# Patient Record
Sex: Female | Born: 1945
Health system: Southern US, Community
[De-identification: ages and names within clinical notes are randomized; demographics above are authoritative.]

## PROBLEM LIST (undated history)

## (undated) DIAGNOSIS — G43909 Migraine, unspecified, not intractable, without status migrainosus: Secondary | ICD-10-CM

## (undated) DIAGNOSIS — L309 Dermatitis, unspecified: Secondary | ICD-10-CM

## (undated) DIAGNOSIS — K635 Polyp of colon: Secondary | ICD-10-CM

## (undated) DIAGNOSIS — Z8619 Personal history of other infectious and parasitic diseases: Secondary | ICD-10-CM

## (undated) DIAGNOSIS — M1990 Unspecified osteoarthritis, unspecified site: Secondary | ICD-10-CM

## (undated) DIAGNOSIS — E119 Type 2 diabetes mellitus without complications: Secondary | ICD-10-CM

## (undated) DIAGNOSIS — R197 Diarrhea, unspecified: Secondary | ICD-10-CM

## (undated) DIAGNOSIS — R51 Headache: Secondary | ICD-10-CM

## (undated) DIAGNOSIS — K219 Gastro-esophageal reflux disease without esophagitis: Secondary | ICD-10-CM

## (undated) DIAGNOSIS — R0602 Shortness of breath: Secondary | ICD-10-CM

## (undated) DIAGNOSIS — R739 Hyperglycemia, unspecified: Secondary | ICD-10-CM

## (undated) DIAGNOSIS — Z0000 Encounter for general adult medical examination without abnormal findings: Secondary | ICD-10-CM

## (undated) DIAGNOSIS — K5792 Diverticulitis of intestine, part unspecified, without perforation or abscess without bleeding: Secondary | ICD-10-CM

## (undated) DIAGNOSIS — K7460 Unspecified cirrhosis of liver: Secondary | ICD-10-CM

## (undated) DIAGNOSIS — N649 Disorder of breast, unspecified: Secondary | ICD-10-CM

## (undated) DIAGNOSIS — C649 Malignant neoplasm of unspecified kidney, except renal pelvis: Secondary | ICD-10-CM

## (undated) DIAGNOSIS — C3490 Malignant neoplasm of unspecified part of unspecified bronchus or lung: Secondary | ICD-10-CM

## (undated) DIAGNOSIS — J189 Pneumonia, unspecified organism: Secondary | ICD-10-CM

## (undated) DIAGNOSIS — E669 Obesity, unspecified: Secondary | ICD-10-CM

## (undated) DIAGNOSIS — N289 Disorder of kidney and ureter, unspecified: Secondary | ICD-10-CM

## (undated) DIAGNOSIS — T7840XA Allergy, unspecified, initial encounter: Secondary | ICD-10-CM

## (undated) DIAGNOSIS — M109 Gout, unspecified: Secondary | ICD-10-CM

## (undated) HISTORY — DX: Polyp of colon: K63.5

## (undated) HISTORY — PX: ABDOMINAL HYSTERECTOMY: SHX81

## (undated) HISTORY — DX: Gout, unspecified: M10.9

## (undated) HISTORY — DX: Obesity, unspecified: E66.9

## (undated) HISTORY — PX: ESOPHAGOGASTRODUODENOSCOPY: SHX1529

## (undated) HISTORY — DX: Diarrhea, unspecified: R19.7

## (undated) HISTORY — DX: Personal history of other infectious and parasitic diseases: Z86.19

## (undated) HISTORY — DX: Allergy, unspecified, initial encounter: T78.40XA

## (undated) HISTORY — DX: Dermatitis, unspecified: L30.9

## (undated) HISTORY — DX: Disorder of breast, unspecified: N64.9

## (undated) HISTORY — DX: Migraine, unspecified, not intractable, without status migrainosus: G43.909

## (undated) HISTORY — DX: Diverticulitis of intestine, part unspecified, without perforation or abscess without bleeding: K57.92

## (undated) HISTORY — DX: Type 2 diabetes mellitus without complications: E11.9

## (undated) HISTORY — PX: CHOLECYSTECTOMY: SHX55

## (undated) HISTORY — DX: Hyperglycemia, unspecified: R73.9

## (undated) HISTORY — DX: Encounter for general adult medical examination without abnormal findings: Z00.00

## (undated) HISTORY — PX: CARDIAC CATHETERIZATION: SHX172

## (undated) HISTORY — PX: APPENDECTOMY: SHX54

---

## 1997-05-05 ENCOUNTER — Ambulatory Visit (HOSPITAL_COMMUNITY): Admission: RE | Admit: 1997-05-05 | Discharge: 1997-05-05 | Payer: Self-pay | Admitting: *Deleted

## 1997-12-10 ENCOUNTER — Ambulatory Visit (HOSPITAL_COMMUNITY): Admission: RE | Admit: 1997-12-10 | Discharge: 1997-12-10 | Payer: Self-pay | Admitting: *Deleted

## 1999-01-31 HISTORY — PX: KIDNEY SURGERY: SHX687

## 1999-01-31 HISTORY — PX: RENAL MASS EXCISION: SHX2324

## 1999-03-02 ENCOUNTER — Encounter: Payer: Self-pay | Admitting: *Deleted

## 1999-03-02 ENCOUNTER — Ambulatory Visit (HOSPITAL_COMMUNITY): Admission: RE | Admit: 1999-03-02 | Discharge: 1999-03-02 | Payer: Self-pay | Admitting: *Deleted

## 1999-11-06 ENCOUNTER — Emergency Department (HOSPITAL_COMMUNITY): Admission: EM | Admit: 1999-11-06 | Discharge: 1999-11-06 | Payer: Self-pay | Admitting: Emergency Medicine

## 1999-11-06 ENCOUNTER — Encounter: Payer: Self-pay | Admitting: Emergency Medicine

## 1999-11-15 ENCOUNTER — Inpatient Hospital Stay (HOSPITAL_COMMUNITY): Admission: RE | Admit: 1999-11-15 | Discharge: 1999-11-19 | Payer: Self-pay

## 1999-11-15 ENCOUNTER — Encounter (INDEPENDENT_AMBULATORY_CARE_PROVIDER_SITE_OTHER): Payer: Self-pay | Admitting: Specialist

## 2000-04-11 ENCOUNTER — Encounter: Payer: Self-pay | Admitting: *Deleted

## 2000-04-11 ENCOUNTER — Ambulatory Visit (HOSPITAL_COMMUNITY): Admission: RE | Admit: 2000-04-11 | Discharge: 2000-04-11 | Payer: Self-pay | Admitting: *Deleted

## 2000-07-11 ENCOUNTER — Encounter: Payer: Self-pay | Admitting: Urology

## 2000-07-11 ENCOUNTER — Encounter: Admission: RE | Admit: 2000-07-11 | Discharge: 2000-07-11 | Payer: Self-pay | Admitting: Urology

## 2001-01-01 ENCOUNTER — Encounter: Payer: Self-pay | Admitting: Urology

## 2001-01-01 ENCOUNTER — Encounter: Admission: RE | Admit: 2001-01-01 | Discharge: 2001-01-01 | Payer: Self-pay | Admitting: Urology

## 2001-04-24 ENCOUNTER — Ambulatory Visit (HOSPITAL_COMMUNITY): Admission: RE | Admit: 2001-04-24 | Discharge: 2001-04-24 | Payer: Self-pay | Admitting: *Deleted

## 2001-04-24 ENCOUNTER — Encounter: Payer: Self-pay | Admitting: *Deleted

## 2001-07-02 ENCOUNTER — Encounter: Payer: Self-pay | Admitting: Urology

## 2001-07-02 ENCOUNTER — Encounter: Admission: RE | Admit: 2001-07-02 | Discharge: 2001-07-02 | Payer: Self-pay | Admitting: Urology

## 2001-08-16 ENCOUNTER — Encounter (INDEPENDENT_AMBULATORY_CARE_PROVIDER_SITE_OTHER): Payer: Self-pay | Admitting: *Deleted

## 2001-08-16 ENCOUNTER — Ambulatory Visit (HOSPITAL_COMMUNITY): Admission: RE | Admit: 2001-08-16 | Discharge: 2001-08-16 | Payer: Self-pay | Admitting: Gastroenterology

## 2002-01-07 ENCOUNTER — Encounter: Admission: RE | Admit: 2002-01-07 | Discharge: 2002-01-07 | Payer: Self-pay | Admitting: Urology

## 2002-01-07 ENCOUNTER — Encounter: Payer: Self-pay | Admitting: Urology

## 2002-04-29 ENCOUNTER — Encounter: Payer: Self-pay | Admitting: *Deleted

## 2002-04-29 ENCOUNTER — Ambulatory Visit (HOSPITAL_COMMUNITY): Admission: RE | Admit: 2002-04-29 | Discharge: 2002-04-29 | Payer: Self-pay | Admitting: *Deleted

## 2002-07-16 ENCOUNTER — Encounter: Admission: RE | Admit: 2002-07-16 | Discharge: 2002-07-16 | Payer: Self-pay | Admitting: Urology

## 2002-07-16 ENCOUNTER — Encounter: Payer: Self-pay | Admitting: Urology

## 2002-12-10 ENCOUNTER — Ambulatory Visit (HOSPITAL_COMMUNITY): Admission: RE | Admit: 2002-12-10 | Discharge: 2002-12-10 | Payer: Self-pay | Admitting: Urology

## 2003-06-03 ENCOUNTER — Ambulatory Visit (HOSPITAL_COMMUNITY): Admission: RE | Admit: 2003-06-03 | Discharge: 2003-06-03 | Payer: Self-pay | Admitting: *Deleted

## 2003-08-04 ENCOUNTER — Emergency Department (HOSPITAL_COMMUNITY): Admission: EM | Admit: 2003-08-04 | Discharge: 2003-08-04 | Payer: Self-pay | Admitting: Emergency Medicine

## 2004-01-12 ENCOUNTER — Ambulatory Visit: Payer: Self-pay | Admitting: Internal Medicine

## 2004-06-14 ENCOUNTER — Ambulatory Visit (HOSPITAL_COMMUNITY): Admission: RE | Admit: 2004-06-14 | Discharge: 2004-06-14 | Payer: Self-pay | Admitting: *Deleted

## 2004-09-28 ENCOUNTER — Ambulatory Visit (HOSPITAL_COMMUNITY): Admission: RE | Admit: 2004-09-28 | Discharge: 2004-09-28 | Payer: Self-pay | Admitting: Gastroenterology

## 2004-09-28 ENCOUNTER — Encounter (INDEPENDENT_AMBULATORY_CARE_PROVIDER_SITE_OTHER): Payer: Self-pay | Admitting: Specialist

## 2005-09-01 ENCOUNTER — Ambulatory Visit (HOSPITAL_COMMUNITY): Admission: RE | Admit: 2005-09-01 | Discharge: 2005-09-01 | Payer: Self-pay | Admitting: *Deleted

## 2005-09-19 ENCOUNTER — Ambulatory Visit (HOSPITAL_COMMUNITY): Admission: RE | Admit: 2005-09-19 | Discharge: 2005-09-19 | Payer: Self-pay | Admitting: Urology

## 2006-01-30 HISTORY — PX: LUNG CANCER SURGERY: SHX702

## 2006-09-03 ENCOUNTER — Ambulatory Visit (HOSPITAL_COMMUNITY): Admission: RE | Admit: 2006-09-03 | Discharge: 2006-09-03 | Payer: Self-pay | Admitting: *Deleted

## 2006-10-03 ENCOUNTER — Ambulatory Visit: Payer: Self-pay | Admitting: Thoracic Surgery

## 2006-10-09 ENCOUNTER — Ambulatory Visit (HOSPITAL_COMMUNITY): Admission: RE | Admit: 2006-10-09 | Discharge: 2006-10-09 | Payer: Self-pay | Admitting: Thoracic Surgery

## 2006-10-11 ENCOUNTER — Ambulatory Visit: Payer: Self-pay | Admitting: Thoracic Surgery

## 2006-11-05 ENCOUNTER — Ambulatory Visit: Payer: Self-pay | Admitting: Thoracic Surgery

## 2006-11-05 ENCOUNTER — Encounter: Payer: Self-pay | Admitting: Thoracic Surgery

## 2006-11-05 ENCOUNTER — Inpatient Hospital Stay (HOSPITAL_COMMUNITY): Admission: RE | Admit: 2006-11-05 | Discharge: 2006-11-09 | Payer: Self-pay | Admitting: Thoracic Surgery

## 2006-11-07 ENCOUNTER — Ambulatory Visit: Payer: Self-pay | Admitting: Oncology

## 2006-11-15 ENCOUNTER — Encounter: Admission: RE | Admit: 2006-11-15 | Discharge: 2006-11-15 | Payer: Self-pay | Admitting: Thoracic Surgery

## 2006-11-15 ENCOUNTER — Ambulatory Visit: Payer: Self-pay | Admitting: Thoracic Surgery

## 2006-11-21 LAB — CBC WITH DIFFERENTIAL/PLATELET
EOS%: 2.3 % (ref 0.0–7.0)
Eosinophils Absolute: 0.2 10*3/uL (ref 0.0–0.5)
LYMPH%: 36.4 % (ref 14.0–48.0)
MCH: 31.5 pg (ref 26.0–34.0)
MCHC: 35.4 g/dL (ref 32.0–36.0)
MCV: 89 fL (ref 81.0–101.0)
MONO%: 5.6 % (ref 0.0–13.0)
NEUT#: 5.5 10*3/uL (ref 1.5–6.5)
Platelets: 418 10*3/uL — ABNORMAL HIGH (ref 145–400)
RBC: 4.42 10*6/uL (ref 3.70–5.32)

## 2006-11-21 LAB — COMPREHENSIVE METABOLIC PANEL
Alkaline Phosphatase: 77 U/L (ref 39–117)
Glucose, Bld: 126 mg/dL — ABNORMAL HIGH (ref 70–99)
Sodium: 141 mEq/L (ref 135–145)
Total Bilirubin: 0.4 mg/dL (ref 0.3–1.2)
Total Protein: 7.4 g/dL (ref 6.0–8.3)

## 2006-12-12 ENCOUNTER — Encounter: Admission: RE | Admit: 2006-12-12 | Discharge: 2006-12-12 | Payer: Self-pay | Admitting: Thoracic Surgery

## 2006-12-12 ENCOUNTER — Ambulatory Visit: Payer: Self-pay | Admitting: Thoracic Surgery

## 2007-01-02 ENCOUNTER — Encounter: Admission: RE | Admit: 2007-01-02 | Discharge: 2007-01-02 | Payer: Self-pay | Admitting: Thoracic Surgery

## 2007-01-02 ENCOUNTER — Ambulatory Visit: Payer: Self-pay | Admitting: Thoracic Surgery

## 2007-01-30 ENCOUNTER — Encounter (INDEPENDENT_AMBULATORY_CARE_PROVIDER_SITE_OTHER): Payer: Self-pay | Admitting: Interventional Cardiology

## 2007-01-30 ENCOUNTER — Inpatient Hospital Stay (HOSPITAL_COMMUNITY): Admission: EM | Admit: 2007-01-30 | Discharge: 2007-02-01 | Payer: Self-pay | Admitting: Emergency Medicine

## 2007-02-06 ENCOUNTER — Ambulatory Visit: Payer: Self-pay | Admitting: Thoracic Surgery

## 2007-02-19 ENCOUNTER — Ambulatory Visit: Payer: Self-pay | Admitting: Oncology

## 2007-02-21 LAB — CBC WITH DIFFERENTIAL/PLATELET
Eosinophils Absolute: 0.2 10*3/uL (ref 0.0–0.5)
HCT: 43.4 % (ref 34.8–46.6)
LYMPH%: 39.9 % (ref 14.0–48.0)
MCHC: 34.3 g/dL (ref 32.0–36.0)
MONO#: 0.6 10*3/uL (ref 0.1–0.9)
NEUT#: 5.5 10*3/uL (ref 1.5–6.5)
NEUT%: 52.1 % (ref 39.6–76.8)
Platelets: 358 10*3/uL (ref 145–400)
WBC: 10.5 10*3/uL — ABNORMAL HIGH (ref 3.9–10.0)

## 2007-02-21 LAB — LACTATE DEHYDROGENASE: LDH: 209 U/L (ref 94–250)

## 2007-02-21 LAB — COMPREHENSIVE METABOLIC PANEL
CO2: 22 mEq/L (ref 19–32)
Creatinine, Ser: 1.19 mg/dL (ref 0.40–1.20)
Glucose, Bld: 121 mg/dL — ABNORMAL HIGH (ref 70–99)
Total Bilirubin: 0.4 mg/dL (ref 0.3–1.2)

## 2007-02-25 ENCOUNTER — Ambulatory Visit (HOSPITAL_COMMUNITY): Admission: RE | Admit: 2007-02-25 | Discharge: 2007-02-25 | Payer: Self-pay | Admitting: Oncology

## 2007-04-02 ENCOUNTER — Encounter: Admission: RE | Admit: 2007-04-02 | Discharge: 2007-04-02 | Payer: Self-pay | Admitting: Thoracic Surgery

## 2007-04-02 ENCOUNTER — Ambulatory Visit: Payer: Self-pay | Admitting: Thoracic Surgery

## 2007-05-24 ENCOUNTER — Ambulatory Visit: Payer: Self-pay | Admitting: Oncology

## 2007-05-28 LAB — COMPREHENSIVE METABOLIC PANEL
ALT: 116 U/L — ABNORMAL HIGH (ref 0–35)
AST: 87 U/L — ABNORMAL HIGH (ref 0–37)
Albumin: 4.2 g/dL (ref 3.5–5.2)
Calcium: 10 mg/dL (ref 8.4–10.5)
Chloride: 104 mEq/L (ref 96–112)
Potassium: 4.7 mEq/L (ref 3.5–5.3)

## 2007-05-28 LAB — CBC WITH DIFFERENTIAL/PLATELET
BASO%: 0.7 % (ref 0.0–2.0)
Basophils Absolute: 0.1 10*3/uL (ref 0.0–0.1)
EOS%: 1.9 % (ref 0.0–7.0)
HGB: 14.8 g/dL (ref 11.6–15.9)
MCH: 30.2 pg (ref 26.0–34.0)
MCHC: 34.1 g/dL (ref 32.0–36.0)
MONO#: 0.5 10*3/uL (ref 0.1–0.9)
RDW: 12.3 % (ref 11.3–14.5)
WBC: 10.2 10*3/uL — ABNORMAL HIGH (ref 3.9–10.0)
lymph#: 3.2 10*3/uL (ref 0.9–3.3)

## 2007-07-09 ENCOUNTER — Encounter: Admission: RE | Admit: 2007-07-09 | Discharge: 2007-07-09 | Payer: Self-pay | Admitting: Family Medicine

## 2007-08-06 ENCOUNTER — Ambulatory Visit: Payer: Self-pay | Admitting: Thoracic Surgery

## 2007-08-06 ENCOUNTER — Encounter: Admission: RE | Admit: 2007-08-06 | Discharge: 2007-08-06 | Payer: Self-pay | Admitting: Thoracic Surgery

## 2007-08-14 ENCOUNTER — Ambulatory Visit: Payer: Self-pay | Admitting: Internal Medicine

## 2007-08-14 DIAGNOSIS — C649 Malignant neoplasm of unspecified kidney, except renal pelvis: Secondary | ICD-10-CM | POA: Insufficient documentation

## 2007-08-14 DIAGNOSIS — R0600 Dyspnea, unspecified: Secondary | ICD-10-CM | POA: Insufficient documentation

## 2007-08-14 DIAGNOSIS — R635 Abnormal weight gain: Secondary | ICD-10-CM | POA: Insufficient documentation

## 2007-08-21 ENCOUNTER — Ambulatory Visit: Payer: Self-pay | Admitting: Oncology

## 2007-08-26 ENCOUNTER — Ambulatory Visit (HOSPITAL_COMMUNITY): Admission: RE | Admit: 2007-08-26 | Discharge: 2007-08-26 | Payer: Self-pay | Admitting: Oncology

## 2007-08-26 LAB — CBC WITH DIFFERENTIAL/PLATELET
BASO%: 0.3 % (ref 0.0–2.0)
Eosinophils Absolute: 0.2 10*3/uL (ref 0.0–0.5)
LYMPH%: 31.4 % (ref 14.0–48.0)
MCHC: 34.3 g/dL (ref 32.0–36.0)
MCV: 88.2 fL (ref 81.0–101.0)
MONO%: 5.3 % (ref 0.0–13.0)
NEUT#: 6.4 10*3/uL (ref 1.5–6.5)
Platelets: 298 10*3/uL (ref 145–400)
RBC: 4.72 10*6/uL (ref 3.70–5.32)
RDW: 13.3 % (ref 11.3–14.5)
WBC: 10.5 10*3/uL — ABNORMAL HIGH (ref 3.9–10.0)

## 2007-08-26 LAB — COMPREHENSIVE METABOLIC PANEL
ALT: 97 U/L — ABNORMAL HIGH (ref 0–35)
CO2: 26 mEq/L (ref 19–32)
Calcium: 9.8 mg/dL (ref 8.4–10.5)
Chloride: 106 mEq/L (ref 96–112)
Creatinine, Ser: 1 mg/dL (ref 0.40–1.20)
Glucose, Bld: 108 mg/dL — ABNORMAL HIGH (ref 70–99)

## 2007-08-26 LAB — LACTATE DEHYDROGENASE: LDH: 190 U/L (ref 94–250)

## 2007-09-10 ENCOUNTER — Ambulatory Visit (HOSPITAL_COMMUNITY): Admission: RE | Admit: 2007-09-10 | Discharge: 2007-09-10 | Payer: Self-pay | Admitting: Family Medicine

## 2007-09-16 ENCOUNTER — Ambulatory Visit: Payer: Self-pay | Admitting: Internal Medicine

## 2007-09-16 DIAGNOSIS — R059 Cough, unspecified: Secondary | ICD-10-CM | POA: Insufficient documentation

## 2007-09-16 DIAGNOSIS — R05 Cough: Secondary | ICD-10-CM

## 2007-09-27 ENCOUNTER — Ambulatory Visit (HOSPITAL_COMMUNITY): Admission: RE | Admit: 2007-09-27 | Discharge: 2007-09-27 | Payer: Self-pay | Admitting: Family Medicine

## 2007-11-27 ENCOUNTER — Ambulatory Visit: Payer: Self-pay | Admitting: Oncology

## 2007-11-29 LAB — CBC WITH DIFFERENTIAL/PLATELET
Basophils Absolute: 0 10*3/uL (ref 0.0–0.1)
Eosinophils Absolute: 0.2 10*3/uL (ref 0.0–0.5)
HGB: 14.5 g/dL (ref 11.6–15.9)
NEUT#: 5.1 10*3/uL (ref 1.5–6.5)
RBC: 4.67 10*6/uL (ref 3.70–5.32)
RDW: 12.9 % (ref 11.3–14.5)
WBC: 8.7 10*3/uL (ref 3.9–10.0)
lymph#: 2.9 10*3/uL (ref 0.9–3.3)

## 2007-11-29 LAB — COMPREHENSIVE METABOLIC PANEL
AST: 74 U/L — ABNORMAL HIGH (ref 0–37)
Albumin: 4.2 g/dL (ref 3.5–5.2)
Alkaline Phosphatase: 73 U/L (ref 39–117)
BUN: 20 mg/dL (ref 6–23)
Calcium: 10.4 mg/dL (ref 8.4–10.5)
Chloride: 104 mEq/L (ref 96–112)
Glucose, Bld: 147 mg/dL — ABNORMAL HIGH (ref 70–99)
Potassium: 5.1 mEq/L (ref 3.5–5.3)
Sodium: 142 mEq/L (ref 135–145)
Total Protein: 7.5 g/dL (ref 6.0–8.3)

## 2007-12-17 ENCOUNTER — Ambulatory Visit: Payer: Self-pay | Admitting: Thoracic Surgery

## 2007-12-17 ENCOUNTER — Encounter: Admission: RE | Admit: 2007-12-17 | Discharge: 2007-12-17 | Payer: Self-pay | Admitting: Thoracic Surgery

## 2008-02-20 ENCOUNTER — Ambulatory Visit: Payer: Self-pay | Admitting: Oncology

## 2008-02-24 ENCOUNTER — Ambulatory Visit (HOSPITAL_COMMUNITY): Admission: RE | Admit: 2008-02-24 | Discharge: 2008-02-24 | Payer: Self-pay | Admitting: Oncology

## 2008-02-24 LAB — CBC WITH DIFFERENTIAL/PLATELET
BASO%: 0.5 % (ref 0.0–2.0)
Basophils Absolute: 0 10*3/uL (ref 0.0–0.1)
HCT: 43 % (ref 34.8–46.6)
HGB: 14.8 g/dL (ref 11.6–15.9)
LYMPH%: 30.6 % (ref 14.0–48.0)
MCHC: 34.4 g/dL (ref 32.0–36.0)
MONO#: 0.4 10*3/uL (ref 0.1–0.9)
NEUT%: 62.1 % (ref 39.6–76.8)
Platelets: 293 10*3/uL (ref 145–400)
WBC: 9.4 10*3/uL (ref 3.9–10.0)

## 2008-02-24 LAB — COMPREHENSIVE METABOLIC PANEL
AST: 58 U/L — ABNORMAL HIGH (ref 0–37)
Albumin: 3.8 g/dL (ref 3.5–5.2)
BUN: 16 mg/dL (ref 6–23)
CO2: 26 mEq/L (ref 19–32)
Calcium: 9.7 mg/dL (ref 8.4–10.5)
Chloride: 104 mEq/L (ref 96–112)
Creatinine, Ser: 0.98 mg/dL (ref 0.40–1.20)
Glucose, Bld: 134 mg/dL — ABNORMAL HIGH (ref 70–99)

## 2008-02-24 LAB — LACTATE DEHYDROGENASE: LDH: 165 U/L (ref 94–250)

## 2008-03-03 ENCOUNTER — Ambulatory Visit: Payer: Self-pay | Admitting: Thoracic Surgery

## 2008-05-29 ENCOUNTER — Ambulatory Visit: Payer: Self-pay | Admitting: Oncology

## 2008-08-26 ENCOUNTER — Ambulatory Visit: Payer: Self-pay | Admitting: Oncology

## 2008-08-31 ENCOUNTER — Ambulatory Visit (HOSPITAL_COMMUNITY): Admission: RE | Admit: 2008-08-31 | Discharge: 2008-08-31 | Payer: Self-pay | Admitting: Oncology

## 2008-08-31 LAB — CBC WITH DIFFERENTIAL/PLATELET
BASO%: 1.3 % (ref 0.0–2.0)
LYMPH%: 31.7 % (ref 14.0–49.7)
MCH: 30.8 pg (ref 25.1–34.0)
MCHC: 34.2 g/dL (ref 31.5–36.0)
MCV: 89.9 fL (ref 79.5–101.0)
MONO%: 5.7 % (ref 0.0–14.0)
Platelets: 290 10*3/uL (ref 145–400)
RBC: 4.72 10*6/uL (ref 3.70–5.45)

## 2008-08-31 LAB — COMPREHENSIVE METABOLIC PANEL
ALT: 70 U/L — ABNORMAL HIGH (ref 0–35)
Alkaline Phosphatase: 62 U/L (ref 39–117)
Creatinine, Ser: 1.02 mg/dL (ref 0.40–1.20)
Sodium: 138 mEq/L (ref 135–145)
Total Bilirubin: 0.8 mg/dL (ref 0.3–1.2)
Total Protein: 8 g/dL (ref 6.0–8.3)

## 2008-09-01 ENCOUNTER — Ambulatory Visit: Payer: Self-pay | Admitting: Thoracic Surgery

## 2008-10-02 ENCOUNTER — Ambulatory Visit (HOSPITAL_COMMUNITY): Admission: RE | Admit: 2008-10-02 | Discharge: 2008-10-02 | Payer: Self-pay | Admitting: Family Medicine

## 2009-01-04 ENCOUNTER — Ambulatory Visit: Payer: Self-pay | Admitting: Oncology

## 2009-01-05 ENCOUNTER — Encounter: Admission: RE | Admit: 2009-01-05 | Discharge: 2009-01-05 | Payer: Self-pay | Admitting: Thoracic Surgery

## 2009-01-05 ENCOUNTER — Ambulatory Visit: Payer: Self-pay | Admitting: Thoracic Surgery

## 2009-01-06 LAB — CBC WITH DIFFERENTIAL/PLATELET
Basophils Absolute: 0.2 10*3/uL — ABNORMAL HIGH (ref 0.0–0.1)
EOS%: 2.3 % (ref 0.0–7.0)
Eosinophils Absolute: 0.2 10*3/uL (ref 0.0–0.5)
HGB: 14.5 g/dL (ref 11.6–15.9)
MCH: 30.8 pg (ref 25.1–34.0)
MCV: 91.7 fL (ref 79.5–101.0)
MONO%: 3.8 % (ref 0.0–14.0)
NEUT#: 5.6 10*3/uL (ref 1.5–6.5)
RBC: 4.72 10*6/uL (ref 3.70–5.45)
RDW: 13.1 % (ref 11.2–14.5)
lymph#: 3.1 10*3/uL (ref 0.9–3.3)

## 2009-01-06 LAB — COMPREHENSIVE METABOLIC PANEL
ALT: 72 U/L — ABNORMAL HIGH (ref 0–35)
AST: 55 U/L — ABNORMAL HIGH (ref 0–37)
Alkaline Phosphatase: 64 U/L (ref 39–117)
CO2: 19 mEq/L (ref 19–32)
Sodium: 140 mEq/L (ref 135–145)
Total Bilirubin: 0.4 mg/dL (ref 0.3–1.2)
Total Protein: 7.8 g/dL (ref 6.0–8.3)

## 2009-07-06 ENCOUNTER — Ambulatory Visit: Payer: Self-pay | Admitting: Oncology

## 2009-07-20 LAB — CBC WITH DIFFERENTIAL/PLATELET
BASO%: 0.5 % (ref 0.0–2.0)
EOS%: 2.6 % (ref 0.0–7.0)
LYMPH%: 37.5 % (ref 14.0–49.7)
MCH: 31.3 pg (ref 25.1–34.0)
MCHC: 34.6 g/dL (ref 31.5–36.0)
MCV: 90.3 fL (ref 79.5–101.0)
MONO%: 5.1 % (ref 0.0–14.0)
Platelets: 286 10*3/uL (ref 145–400)
RBC: 4.67 10*6/uL (ref 3.70–5.45)
WBC: 9.8 10*3/uL (ref 3.9–10.3)

## 2009-07-20 LAB — COMPREHENSIVE METABOLIC PANEL
ALT: 44 U/L — ABNORMAL HIGH (ref 0–35)
Alkaline Phosphatase: 64 U/L (ref 39–117)
Sodium: 137 mEq/L (ref 135–145)
Total Bilirubin: 0.4 mg/dL (ref 0.3–1.2)
Total Protein: 7.5 g/dL (ref 6.0–8.3)

## 2009-07-28 ENCOUNTER — Ambulatory Visit (HOSPITAL_COMMUNITY): Admission: RE | Admit: 2009-07-28 | Discharge: 2009-07-28 | Payer: Self-pay | Admitting: Oncology

## 2009-10-05 ENCOUNTER — Ambulatory Visit (HOSPITAL_COMMUNITY): Admission: RE | Admit: 2009-10-05 | Discharge: 2009-10-05 | Payer: Self-pay | Admitting: Family Medicine

## 2010-01-04 ENCOUNTER — Ambulatory Visit: Payer: Self-pay | Admitting: Oncology

## 2010-01-05 LAB — CBC WITH DIFFERENTIAL/PLATELET
BASO%: 0.5 % (ref 0.0–2.0)
Basophils Absolute: 0.1 10*3/uL (ref 0.0–0.1)
EOS%: 1.7 % (ref 0.0–7.0)
HCT: 41.4 % (ref 34.8–46.6)
LYMPH%: 35 % (ref 14.0–49.7)
MCH: 31.2 pg (ref 25.1–34.0)
MCHC: 34.8 g/dL (ref 31.5–36.0)
MONO#: 0.6 10*3/uL (ref 0.1–0.9)
NEUT%: 56.4 % (ref 38.4–76.8)
Platelets: 306 10*3/uL (ref 145–400)

## 2010-01-05 LAB — COMPREHENSIVE METABOLIC PANEL
Alkaline Phosphatase: 67 U/L (ref 39–117)
BUN: 17 mg/dL (ref 6–23)
CO2: 22 mEq/L (ref 19–32)
Creatinine, Ser: 1.1 mg/dL (ref 0.40–1.20)
Glucose, Bld: 95 mg/dL (ref 70–99)
Sodium: 137 mEq/L (ref 135–145)
Total Bilirubin: 0.5 mg/dL (ref 0.3–1.2)
Total Protein: 7.8 g/dL (ref 6.0–8.3)

## 2010-01-05 LAB — LACTATE DEHYDROGENASE: LDH: 171 U/L (ref 94–250)

## 2010-02-08 ENCOUNTER — Ambulatory Visit (HOSPITAL_COMMUNITY)
Admission: RE | Admit: 2010-02-08 | Discharge: 2010-02-08 | Payer: Self-pay | Source: Home / Self Care | Attending: Oncology | Admitting: Oncology

## 2010-03-17 ENCOUNTER — Other Ambulatory Visit: Payer: Self-pay | Admitting: Oncology

## 2010-03-17 DIAGNOSIS — C649 Malignant neoplasm of unspecified kidney, except renal pelvis: Secondary | ICD-10-CM

## 2010-03-17 DIAGNOSIS — C3490 Malignant neoplasm of unspecified part of unspecified bronchus or lung: Secondary | ICD-10-CM

## 2010-04-27 ENCOUNTER — Encounter (HOSPITAL_BASED_OUTPATIENT_CLINIC_OR_DEPARTMENT_OTHER)
Admission: RE | Admit: 2010-04-27 | Discharge: 2010-04-27 | Disposition: A | Discharge status: Home / Self Care | Payer: Medicare Other | Source: Ambulatory Visit | Attending: Orthopedic Surgery | Admitting: Orthopedic Surgery

## 2010-04-29 ENCOUNTER — Ambulatory Visit (HOSPITAL_BASED_OUTPATIENT_CLINIC_OR_DEPARTMENT_OTHER)
Admission: RE | Admit: 2010-04-29 | Discharge: 2010-04-29 | Disposition: A | Discharge status: Home / Self Care | Payer: Medicare Other | Source: Ambulatory Visit | Attending: Orthopedic Surgery | Admitting: Orthopedic Surgery

## 2010-04-29 DIAGNOSIS — E669 Obesity, unspecified: Secondary | ICD-10-CM | POA: Insufficient documentation

## 2010-04-29 DIAGNOSIS — M23329 Other meniscus derangements, posterior horn of medial meniscus, unspecified knee: Secondary | ICD-10-CM | POA: Insufficient documentation

## 2010-04-29 DIAGNOSIS — M6750 Plica syndrome, unspecified knee: Secondary | ICD-10-CM | POA: Insufficient documentation

## 2010-04-29 DIAGNOSIS — J4489 Other specified chronic obstructive pulmonary disease: Secondary | ICD-10-CM | POA: Insufficient documentation

## 2010-04-29 DIAGNOSIS — Z01812 Encounter for preprocedural laboratory examination: Secondary | ICD-10-CM | POA: Insufficient documentation

## 2010-04-29 DIAGNOSIS — M2240 Chondromalacia patellae, unspecified knee: Secondary | ICD-10-CM | POA: Insufficient documentation

## 2010-04-29 DIAGNOSIS — J449 Chronic obstructive pulmonary disease, unspecified: Secondary | ICD-10-CM | POA: Insufficient documentation

## 2010-04-29 DIAGNOSIS — K219 Gastro-esophageal reflux disease without esophagitis: Secondary | ICD-10-CM | POA: Insufficient documentation

## 2010-04-29 LAB — POCT HEMOGLOBIN-HEMACUE: Hemoglobin: 15 g/dL (ref 12.0–15.0)

## 2010-05-05 NOTE — Op Note (Signed)
  [name], Amanda Crawford               ACCOUNT NO.:  [id number]  MEDICAL RECORD NO.:  [id number]          PATIENT TYPE:  AMB  LOCATION:                               FACILITY:  [facility]  PHYSICIAN:  Harvie Junior, M.D.   DATE OF BIRTH:  Jan 14, 1946  DATE OF PROCEDURE:  04/29/2010 DATE OF DISCHARGE:                              OPERATIVE REPORT   PREOPERATIVE DIAGNOSIS:  Medial meniscal tear with chondromalacia, medial condyle.  POSTOPERATIVE DIAGNOSES: 1. Medial meniscal tear with chondromalacia, medial condyle. 2. Medial shelf plica.  PRINCIPAL PROCEDURES: 1. Partial posterior horn medial meniscectomy with corresponding     debridement of the medial compartment. 2. Debridement of medial shelf plica.  SURGERY:  Harvie Junior, MD  ASSISTANT:  Marshia Ly, PA  ANESTHESIA:  General.  HISTORY:  [name] is a 65 year old female with a long history of significant pain in her right knee.  We treated conservatively for a period of time with activity modification, injection therapy and medication because of failure of all conservative care.  MRI was obtained which showed she had a complex posterior horn medial meniscal tear.  She is brought to the operating room for debridement of this as a procedure.  PROCEDURE IN DETAILS:  The patient was brought to the operating room. After adequate anesthesia was obtained using general anesthetic, the patient was placed supine on the operating table.  Right leg was prepped and draped in usual sterile fashion.  Following this, routine arthroscopic examination of the knee revealed there was some mild chondromalacia in the patellofemoral joint, there was a large medial plica blocking entrance to the medial wound which was debrided back to the rim.  There was some chondromalacia over the condyle where this had been rubbing.  Attention was turned to the medial compartment where there was a significant complex posterior horn medial meniscal tear.   We debrided this back to a smooth and stable rim, taking about 15% of the posterior horn of the meniscus.  Once this was completed, attention turned to the medial femoral condyle which had some grade 2 change which was debrided minimally with attention turned to the notch.  The ACL was normal, lateral side showed a normal meniscus and normal articular cartilage.  At this time the knee was copiously and thoroughly lavaged with 6 L of normal saline irrigation and suctioned dry. Arthroscopic portals were closed with a bandage.  Sterile compressive dressing was applied.  The patient taken to recovery where she was noted to be in satisfactory condition.  The estimated blood loss for the procedure was none.     Harvie Junior, M.D.     Ranae Plumber  D:  04/29/2010  T:  04/30/2010  Job:  [id number]  Electronically Signed by Jodi Geralds M.D. on 05/05/2010 05:05:36 PM

## 2010-06-14 NOTE — Assessment & Plan Note (Signed)
OFFICE VISIT   Amanda Crawford, Amanda Crawford  DOB:  [birth year]                                        December 17, 2007  CHART #:  [id number]   Blood pressure is 139/80, pulse 100, respirations 18, and sats are 94%.  Lungs are clear to auscultation and percussion.  She is off the Spiriva  by Dr. Sherene Sires.  Hence, he has told her to take Prevacid for possible  reflux.  Dr. Clelia Croft is planning to repeat a PET scan in January as well  as her CT scan.  We will see her back again after that.  Her chest x-ray  today showed normal postoperative changes.   Ines Bloomer, M.D.  Electronically Signed   DPB/MEDQ  D:  12/17/2007  T:  12/18/2007  Job:  [id number]

## 2010-06-14 NOTE — Assessment & Plan Note (Signed)
OFFICE VISIT   [name], [name]  DOB:  1945-12-17                                        January 02, 2007  CHART #:  [id number]   Amanda Crawford came for followup today.  Her blood pressure was 122/62, pulse  100, respirations 18, SATs were 96%.  Chest x-ray showed mild  atelectasis and scar in the left base.  She is still having some mild  pain, but this has improved, and we told her to gradually increase her  activities and released her to return to work in one week.  She is doing  satisfactory from her surgery, and we will see her back again in 10  weeks after her next CT scan with Dr. Clelia Croft.   Ines Bloomer, M.D.  Electronically Signed   DPB/MEDQ  D:  01/02/2007  T:  01/02/2007  Job:  [id number]

## 2010-06-14 NOTE — Letter (Signed)
January 05, 2009   Blenda Nicely. Shadad  [address] [facility]  [place], [place] [address]   Re:  Amanda Crawford, Amanda Crawford               DOB:  Jul 11, 1945   Dear Dr. Clelia Croft:   I saw the patient in the office today for followup for renal cell  cancer.  Her CT scan showed no evidence of recurrence but this is an  unofficial report.  We do not have the final report.  You should have  the final report tomorrow when you see her.  Her blood pressure is  160/98, pulse 71, respirations 18, sats were 93%.  Overall, she is doing  well.  It has been over 2 years since her surgery.  I will release her  back to you for long-term follow up.  I appreciate the opportunity of  seeing the patient.   Sincerely,   Ines Bloomer, M.D.  Electronically Signed   DPB/MEDQ  D:  01/05/2009  T:  01/06/2009  Job:  [id number]

## 2010-06-14 NOTE — Letter (Signed)
September 01, 2008   Amanda Crawford  [address] [facility]  [place], [place] [address]   Re:  Amanda Crawford, Amanda Crawford               DOB:  12-12-1945   Dear Dr. Clelia Croft:   I saw the patient in the office today and reviewed her CT scan with her  and there is no evidence of recurrence of her renal cancer.  She is  doing well overall.  Her blood pressure is 150/86, pulse 95,  respirations 18, sats were 92%.  Apparently, you will probably see her  again in a year with a repeat scan.  I will be happy to see her back  again at that time.  I appreciate the opportunity of seeing the patient.   Sincerely,   Ines Bloomer, M.D.  Electronically Signed   DPB/MEDQ  D:  09/01/2008  T:  09/02/2008  Job:  [id number]

## 2010-06-14 NOTE — H&P (Signed)
Amanda Crawford, Amanda Crawford               ACCOUNT NO.:  [id number]   MEDICAL RECORD NO.:  [id number]          PATIENT TYPE:  INP   LOCATION:  NA                           FACILITY:  [facility]   PHYSICIAN:  Ines Bloomer, M.D. DATE OF BIRTH:  1945-07-28   DATE OF ADMISSION:  11/01/2006  DATE OF DISCHARGE:                              HISTORY & PHYSICAL   CHIEF COMPLAINT:  Left lung mass.   HISTORY OF PRESENT ILLNESS:  Amanda Crawford was found to have a history of  having renal cell cancer in 2001 at the same time she had  cholecystectomy by Dr. Mosetta Anis; this was a T2N0M0 renal cell  cancer.  She has been followed with CT scans and has now developed a 1.5  x 1.7 lingular lesion.  Pulmonary function tests showed an FVC of 2.39  with FEV-1 of 2.06.  She has had no hemoptysis, fever, chills or  excessive sputum.  She quit smoking recently, but used to smoke one pack  per day.  A PET scan was done that showed that she had an uptake of 1.7  in this area which would go along with a possible cancer, inflammatory  lesion or possible metastatic lesion.  For this reason, we have  recommended that she have a left lingulectomy.   PAST MEDICAL HISTORY:  She is on now medicines.  She has no allergies.   FAMILY HISTORY:  Noncontributory.   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  She does not drink alcohol on a regular basis.   REVIEW OF SYSTEMS:  She has some weight gain; she is 212, 5 feet 5  inches.  CARDIAC:  No angina or atrial fibrillation.  PULMONARY:  No  bronchitis, hemoptysis, asthma or wheezing.  GI:  No nausea, vomiting,  constipation, diarrhea, reflux or GERD.  GU:  No kidney disease or  frequent urination.  VASCULAR:  No claudication, DVT, TIA.  NEUROLOGICAL:  No headaches, blackouts or seizures.  MUSCULOSKELETAL:  No joint change or rash.  PSYCHIATRIC:  No nervousness or depression.  HEMATOLOGICAL:  No problems with bleeding or clotting disorder.  EYE/ENT:  No change in her eyesight or hearing.   PHYSICAL EXAMINATION:  GENERAL:  She is a well-developed Caucasian  female in no acute distress.  VITAL SIGNS:  Her blood pressure is 120/76, pulse 78, respirations 18,  sats are 96%.  HEAD:  Atraumatic.  EYES:  Pupils equal and reactive to light and accommodation.  Extraocular movements are normal.  EARS:  Tympanic membranes are intact.  NOSE:  There is no septal deviation.  THROAT:  Without lesion.  NECK:  Supple without thyromegaly.  CHEST:  Clear to auscultation and percussion.  ABDOMEN:  Soft with no hepatosplenomegaly.  EXTREMITIES:  Pulses are 2+.  There is no clubbing or edema.  NEUROLOGICAL:  She is oriented x3.  Sensory and motor intact.  Cranial  nerves are intact.   IMPRESSION:  1. Enlarging left lingular lesion; rule out metastatic renal cell  cancer; rule out inflammatory lesion; rule out lung cancer.  2. History of tobacco abuse.  3. History of renal cancer.  4. History of cholecystectomy.   PLAN:  Is left video-assisted thoracoscopic surgery and left  lingulectomy.      Ines Bloomer, M.D.  Electronically Signed     DPB/MEDQ  D:  11/01/2006  T:  11/01/2006  Job:  [id number]

## 2010-06-14 NOTE — Op Note (Signed)
NAMEPAYLIN, HAILU NO.:  [id number]   MEDICAL RECORD NO.:  [id number]          PATIENT TYPE:  INP   LOCATION:  3314                         FACILITY:  [facility]   PHYSICIAN:  Ines Bloomer, M.D. DATE OF BIRTH:  Nov 09, 1945   DATE OF PROCEDURE:  DATE OF DISCHARGE:                               OPERATIVE REPORT   PREOPERATIVE DIAGNOSIS:  Left lingular mass.   POSTOPERATIVE DIAGNOSES:  Metastatic renal cell cancer, left lingular.   OPERATIONS PERFORMED:  Left VATS, mini-thoracotomy wedge left upper  lingular lobe, node sampling.   SUBJECTIVE:  Ines Bloomer, M.D.   FIRST ASSISTANT:  Stephanie Acre Dominick,  PA-C.   ANESTHESIA:  General.   PROCEDURE:  After percutaneous insertion of all monitoring lines, the  patient underwent general anesthesia.  Was prepped and draped in the  usual sterile manner.  Two trocar sites were made in the anterior and  posterior   axillary line at 7th intercostal space.  Two  trocars were  inserted and then we decided to do a small anterior thoracotomy after  examining the pleura and lungs and a 7 cm incision was made over the 5th  intercostal space anteriorly, partially dividing the latissimus, and  splitting the serratus anterioris entering the 5th intercostal space,  putting a small  Tuffier in the space, palpating the lesion in the  lingula and then using the auto-suture 60 staplers.  We first fired a 45  to separate the fissure and then came across the lesion with a large  wedge resection, using the 60 Roticulator staplers.  After this has been  done, it was sent to frozen section.  Dissection was started in the  hilum and resected out 11L and a 10Lnodes.  No 5 nodes could be seen.  Two chest tubes were brought into the trocar site and Marcaine block was  done in the usual fashion.  A single On-Q was inserted in the usual  fashion.  The frozen section revealed metastatic renal cell cancer.  Three holes were drilled through the  6th rib and then trocars passed  around, then pericostals #1 Vicryl pericostals was passed through the  holes and then around the 5th rib and tied down.  The chest was closed  with #1 Vicryl in the muscle layer, 2.0 Vicryl in the subcutaneous  tissue and Dermabond for the skin.  Patient returned to the recovery  room in stable condition.      Ines Bloomer, M.D.  Electronically Signed     DPB/MEDQ  D:  11/05/2006  T:  11/05/2006  Job:  [id number]   cc:   Windy Fast L. Earlene Plater, M.D.

## 2010-06-14 NOTE — Discharge Summary (Signed)
NAMELAURABETH, Amanda Crawford NO.:  [id number]   MEDICAL RECORD NO.:  [id number]          PATIENT TYPE:  INP   LOCATION:  2023                         FACILITY:  [facility]   PHYSICIAN:  Guy Franco, P.A.       DATE OF BIRTH:  12/11/1945   DATE OF ADMISSION:  01/30/2007  DATE OF DISCHARGE:  02/01/2007                               DISCHARGE SUMMARY   DISCHARGE DIAGNOSES:  1. Chest pain, felt to be non-cardiac in nature, cardiac      catheterization showed no coronary artery disease.  2. Left bundle branch block.  3. Hypertriglyceridemia.  4. Former smoker.   HOSPITAL COURSE:  [name] was a 65 year old female with a 3-day  history of intermittent substernal chest tightness, admitted on January 30, 2007 for the same.  Her EKG showed new left bundle branch block.   LABORATORY:  Her lab work showed hemoglobin 13.9, hematocrit 39.8,  platelets 292, white count 8.3, sodium 137, potassium 4.0, BUN 13,  creatinine 0.88, maximum CK 136 with a MB fraction of 8.4.  Troponins  were negative.  Total cholesterol 209, triglycerides 214, LDL 117, HDL  49, TSH 1.853.   With the patient continued pain, we will go ahead and proceed with a  cardiac catheterization on February 01, 2007.  The patient had no  identifiable coronary artery disease and was discharged to home later  that day.   DISCHARGE INSTRUCTIONS:  Increase activity slowly.  No driving for 2  days, no lifting over 10 pounds for 1 week.  Clean cath site gently with  soap and water.  Follow with Dr. Edwyna Shell, who has been managing her lung  cancer.  She is concerned that her chest pain may have been related to  that surgery.  We have also advised her to go ahead and make an  appointment with primary care.  She is to continue her vitamins as prior  to admission.  She is allowed to take Tylox as needed for pain.      Guy Franco, P.A.     LB/MEDQ  D:  02/21/2007  T:  02/21/2007  Job:  [id number]   cc:   Ines Bloomer,  M.D.  [facility]  Lyn Records, M.D.

## 2010-06-14 NOTE — Assessment & Plan Note (Signed)
OFFICE VISIT   Amanda Crawford, Amanda Crawford  DOB:  1945-03-23                                        December 12, 2006  CHART #:  [id number]   PHYSICAL EXAMINATION:  Her blood pressure is 147/93, pulse 94,  respirations 18.  Sats were 93%.  Incisions were well-healed.  She is  doing well overall.  She is having some mild pain.   I gave her a refill for Lorcet Plus.  We will see her back again in 3  weeks and decide when she can return to work.   Ines Bloomer, M.D.  Electronically Signed   DPB/MEDQ  D:  12/12/2006  T:  12/13/2006  Job:  [id number]

## 2010-06-14 NOTE — Assessment & Plan Note (Signed)
OFFICE VISIT   Amanda Crawford, Amanda Crawford  DOB:  11/03/1945                                        February 06, 2007  CHART #:  [id number]   The patient came today.  Her chest x-ray is stable.  She had had a  previous CT scan on the 31st when they ruled out a pulmonary embolus and  that was normal.  She still has some mild shortness of breath and her  sats were 92%, her blood pressure is 145/85, pulse 100, respirations 18.  She was admitted to the hospital after going to [facility] to rule out an  MI, and that was negative.  Her blood pressure was up at that time.  Today it was 150/85, pulse 100, respirations 18.  She will be seeing her  primary care doctor about her persistent back pain which is in the mid  lumbar area, radiating to the left iliac crest.  It is on the same side  as where we operated, it is much lower, so I do not think it is related  to her surgery.  I will see her again in 3 months with a chest x-ray for  final check.   Ines Bloomer, M.D.  Electronically Signed   DPB/MEDQ  D:  02/06/2007  T:  02/06/2007  Job:  [id number]

## 2010-06-14 NOTE — Cardiovascular Report (Signed)
NAMENICHOLAS, OSSA NO.:  [id number]   MEDICAL RECORD NO.:  [id number]          PATIENT TYPE:  INP   LOCATION:  2023                         FACILITY:  [facility]   PHYSICIAN:  Lyn Records, M.D.   DATE OF BIRTH:  09/11/1945   DATE OF PROCEDURE:  02/01/2007  DATE OF DISCHARGE:  02/01/2007                            CARDIAC CATHETERIZATION   INDICATION:  The patient presented with exertional chest pain, shortness  of breath, and a new left bundle.  A CT scan ruled out pulmonary emboli.  The patient recently had left partial pneumonectomy for lung cancer.  Because of trace elevations in CK-MB, she is undergoing coronary  angiography to rule out coronary artery disease as the basis for her  presenting symptoms.   PROCEDURES PERFORMED:  1. Left heart catheterization.  2. Selective coronary angiography.  3. Left ventriculography.  4. Angio-Seal.   DESCRIPTION:  After informed consent and following 1 mg of Versed and 50  mcg of fentanyl, the patient was given local 1% Xylocaine infiltration.  A 6-French sheath was placed in the right femoral artery using the  modified Seldinger technique.  A 6-French A2 multipurpose catheter was  then used for hemodynamic recordings, right coronary artery by hand  injection and left ventriculography by hand injection.  Left coronary  arteriography was performed using the 6-French #4 left Judkins catheter.   After demonstrating that no significant coronary disease is present,  Angio-Seal was performed with good hemostasis.   RESULTS:  1. Hemodynamic data:      a.     Aortic pressure 119/70.      b.     Left ventricular pressure 119/13.  2. Left ventriculography:  The left ventricular size is normal.  Wall      motion is dyssynergic due to delayed activation from the patient's      left bundle.  Overall, function is normal.  EF is 55%.  No mitral      regurgitation is noted.  3. Coronary angiography.      a.     Left main  coronary:  Normal.      b.     Left anterior descending coronary:  The LAD is a moderate-       sized vessel that reaches the left ventricular apex.  It gives       origin to a couple of moderate-sized diagonal branches.  No       significant obstruction is noted.      c.     Circumflex artery:  The circumflex coronary artery is       dominant, gives the PDA and also 3 obtuse marginal branches.  The       first obtuse marginal is large and trifurcates on the left lateral       wall.      d.     Right coronary:  The right coronary is nondominant supplying       an acute marginal branch.  No obstruction is noted.   CONCLUSIONS:  1. Normal coronary arteries.  2. Normal left ventricular function.  PLAN:  Further evaluation with Pulmonology and Dr. Edwyna Shell if symptoms  continue.  Plan on discharging the patient home this p.m.      Lyn Records, M.D.  Electronically Signed     HWS/MEDQ  D:  02/01/2007  T:  02/02/2007  Job:  [id number]   cc:   Wyvonne Lenz, NP

## 2010-06-14 NOTE — Letter (Signed)
October 03, 2006   Ronald L. Earlene Plater, M.D.  [address]  [place]  [place] [address]   Re:  OLINE, BELK               DOB:  12-08-1945   Dear Ferne Reus,   I appreciated the opportunity to assist in this diagnosis.  The patient  was found to have renal cell cancer and underwent a left and right  nephrectomy in 2001.  Also at the same time had a cholecystectomy with  Dr. Mosetta Anis.  It was a P2N0M0 renal cell cancer.  She has been  followed with serial CTs and now has developed a 1.5 x 1.7 left lingular  lesion.   Pulmonary function tests show FVC of 2.39 and  FEV1 of 2.06.  She has  had no hemoptysis, fever, chills, excessive sputum or weight loss.  She  gives a history of smoking one pack of cigarettes a day, but quit  smoking just recently.   PAST MEDICAL HISTORY:   MEDICATIONS:  She is no medicines.   ALLERGIES:  No allergies.   FAMILY HISTORY:  Noncontributory.   SOCIAL HISTORY:  She is married and has 3 children.  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  She does not drink alcohol on a  regular basis.   REVIEW OF SYSTEMS:  She has had some weight gain, she is 212 with height  of 5 feet 4 inches.  CARDIAC:  No angina or atrial fibrillation.  PULMONARY:  No bronchitis, hemoptysis, asthma or wheezing.  GI:  No  nausea, vomiting, constipation, diarrhea, reflux or GERD.  GU:  No  kidney disease, dysuria or frequent urination.  VASCULAR:  No  claudication, DVT, TIAs.  NEUROLOGICAL:  No dizziness, headaches,  blackouts or seizures.  MUSCULOSKELETAL:  No joint pains.  No rash or  arthritis.  No psychiatric illnesses.  No problems with hematological  problems or bleeding.   In general she is a well-developed Caucasian female, in no acute  distress.  Blood pressure 120/76, pulse 78, respirations 18, saturations  96%.  Head, eyes, ears, nose and throat are unremarkable.  Neck is  supple without thyromegaly.  There is no supraclavicular or axillary  adenopathy.  Chest is clear to auscultation and percussion.  Heart with  regular sinus rhythm.  Lower abdomen is soft with surgical scars.  No  hepatosplenomegaly.  Pulses are 2+; there was no clubbing or edema.  Neurologic is intact.   A PET scan on Ms. Flaten I feel is probably positive.  I think she has  metastatic renal cell or possibly nonsmall cell lung cancer.  If her PET  scan is only positive in the lesion, I will recommend a left  lingulectomy.   I will see her back after her PET scan.  I appreciate the opportunity in  seeing Ms. Speights.   Ines Bloomer, M.D.  Electronically Signed   DPB/MEDQ  D:  10/03/2006  T:  10/04/2006  Job:  [id number]

## 2010-06-14 NOTE — Assessment & Plan Note (Signed)
OFFICE VISIT   Amanda Crawford, Amanda Crawford  DOB:  [birth year]                                        April 02, 2007  CHART #:  [id number]   The patient returned for follow-up today.  Her recent CT scan was  negative for recurrence of her disease.  Her blood pressure is 138/78,  pulse 90, respirations 20, and saturations were 92%.  She is having some  mild pain.  Chest x-ray today showed normal postoperative changes.  I  will see her again in four months with a chest x-ray.  She will be  seeing Blenda Nicely. Clelia Croft, M.D. in two months.   Ines Bloomer, M.D.  Electronically Signed   DPB/MEDQ  D:  04/02/2007  T:  04/03/2007  Job:  [id number]

## 2010-06-14 NOTE — Assessment & Plan Note (Signed)
OFFICE VISIT   Amanda Crawford, Amanda Crawford  DOB:  05-15-1945                                        August 06, 2007  CHART #:  [id number]   The patient came for followup today.  Her blood pressure was 155/85,  pulse 100, respirations 20, and sats were 92%.  She is still having some  shortness of breath and is put on Spiriva with minimal improvement, and  will be seeing a pulmonologist probably, Dr. Sherene Sires, in the near future.  Dr. Clelia Croft is going to repeat her PET scan in the near future to look  for any recurrence of a renal cell cancer.  Chest x-ray today showed  normal postoperative changes.  From my standpoint, she is doing well,  and I will see her back again in 4 months with a chest x-ray.   Ines Bloomer, M.D.  Electronically Signed   DPB/MEDQ  D:  08/06/2007  T:  08/07/2007  Job:  [id number]

## 2010-06-14 NOTE — Assessment & Plan Note (Signed)
OFFICE VISIT   Amanda Crawford, Amanda Crawford  DOB:  10/05/1945                                        March 03, 2008  CHART #:  [id number]   The patient came today and her CT scan showed no evidence of recurrence  about 18 months since her surgery.  She will have another x-ray in about  6 months.  We will see her back then.  She is doing well overall.  Her  lungs are clear to auscultation and percussion.  Blood pressure is  130/90, pulse 97, respirations 18, and sats are 97%.   Ines Bloomer, M.D.  Electronically Signed   DPB/MEDQ  D:  03/03/2008  T:  03/03/2008  Job:  [id number]

## 2010-06-14 NOTE — Discharge Summary (Signed)
NAMESYLVIE, MIFSUD NO.:  [id number]   MEDICAL RECORD NO.:  [id number]          PATIENT TYPE:  INP   LOCATION:  2039                         FACILITY:  [facility]   PHYSICIAN:  Ines Bloomer, M.D. DATE OF BIRTH:  25-Dec-1945   DATE OF ADMISSION:  11/05/2006  DATE OF DISCHARGE:  11/09/2006                               DISCHARGE SUMMARY   FINAL DIAGNOSES:  Left lingular mass, metastatic renal cell carcinoma.   SECONDARY DIAGNOSIS:  History of tobacco abuse.   OPERATIONS AND PROCEDURES:  Left video-assisted thoracoscopic surgery with left mini thoracotomy,  left lingulectomy with lymph node dissection.   THE PATIENT'S HISTORY AND PHYSICAL AND HOSPITAL COURSE:  [name] is a  65 year old female who was found to have a history of renal cell  carcinoma in 2001 at the same time she had cholecystectomy by Dr.  Orpah Greek.  This was noted to be a T2, N0, M0  renal cell cancer.  The  patient has been followed with CT scans and has not developed a 1.5 x  1.7 lingular lesion.  Pulmonary function tests showed an FVC 2.39 with  an FEV-1 of 2.06.  The patient has had no hemoptysis, fever, chills or  excessive sputum.  She quit smoking recently but used to smoke one pack  per day.  PET scan was done that showed that the patient had uptake of  1.7 in this area which would go along with possible cancer, inflammatory  lesion, possible metastatic lesion.  For this reason, surgery has been  recommended.  The patient was seen and evaluated by Dr. Edwyna Shell.  Dr.  Edwyna Shell discussed with the patient undergoing lingulectomy.  He discussed  risks and benefits.  The patient acknowledged her understanding  and  agreed to proceed.  Surgery was scheduled for November 05, 2006.  For  details of the patient's past medical history and physical exam, please  see dictated H&P.   The patient was taken to the operating room November 05, 2006, where she  underwent left video-assisted thoracoscopic surgery  with mini  thoracotomy, left lingulectomy, and lymph node dissection.  The patient  tolerated this procedure well and was transferred to the intensive care  unit in stable condition.  Postoperatively, the patient was noted to be  hemodynamically stable.  She was extubated following surgery.   Post extubation, the patient is alert and or x4, neurologically intact.  During the patient's postoperative course, she had daily chest x-rays  obtained.  On chest x-ray, was noted to remain stable.  The patient had  no air leak postop day #1. The posterior chest tube discontinued postop  day #1 and suction decreased.  By postop day 32, chest x-ray remained  stable with no air leak, and remaining chest tube discontinued.  Followup chest x-ray showed no pneumothorax following removal on the  final chest tube.   During her postoperative course, vital signs were monitored.  The  patient did develop a temperature with highest temperature at 101.1 on  October 9.  The patient did have slight leukocytosis postoperatively as  well, increasing to 7.5  postop day #2.  The patient had no signs of  infection, and this was watched closely.  By October 10, the patient was  afebrile, and leukocytosis was improving.  The patient's blood pressure  and pulse maintained stable postoperatively.  She was able to be weaned  off oxygen, saturating greater than 90% on room air.  Her pathology  report came back showing positive for metastatic renal cell cancer.  Dr.  Clelia Croft was consulted, planning to set up a followup appointment as an  outpatient.  He did see and evaluate the patient October 8.   The patient remained in normal sinus rhythm postoperatively.  Her  pulmonary status was stable prior to discharge home.  Incisions were  clean, dry and intact and healing well.  The patient was out of bed  ambulating well without difficulty.  She was tolerating diet well.  No  nausea, vomiting noted.   The patient was noted to  be stable, November 09, 2006, and ready for  discharge home.   FOLLOWUP APPOINTMENTS:  Followup appointment has been arranged with Dr.  Edwyna Shell for November 15, 2006, at 3:30 p.m..  The patient will need to  obtain a PA and lateral chest x-ray 30 minutes prior to this  appointment.  The patient will need to follow up with Dr. Clelia Croft as  directed.   ACTIVITY:  Patient instructed no driving until released to do so, no  lifting over 10 pounds.  She is told to ambulate three to four times per  day, progress as tolerated, and to continue her breathing exercises.   INCISIONAL CARE:  The patient is told to shower, washing her incisions  using soap and water.  She is to contact the office if she develops any  drainage or opening from any of her incision sites.   DIET:  The patient educated on diet to be low-fat, low-salt.   DISCHARGE MEDICATIONS:  1. Fish oil daily.  2. Glucosamine daily.  3. Percocet 5/325 one to two tablets q. 4-6 h. p.r.n. pain.      Theda Belfast, [place]      Ines Bloomer, M.D.  Electronically Signed    KMD/MEDQ  D:  11/09/2006  T:  11/09/2006  Job:  [id number]   cc:   Blenda Nicely. [employer]

## 2010-06-14 NOTE — Letter (Signed)
November 15, 2006   Windy Fast L. Earlene Plater, M.D.  [address]  [place], [place] [address]   Re:  Amanda Crawford, Amanda Crawford               DOB:  1945/06/27   Dr. Ferne Reus,   I saw Amanda Crawford back in our office today and she is doing remarkably  well.  I removed her chest tube sutures.  I told her to gradually  increase her activity.  As you know, she had a metastatic renal cell  cancer and I referred her to Dr. Clelia Croft.  She will see him again next  week.  From out standpoint, she looks good.  I will plan to see her back  again in 4 weeks with a chest x-ray.   Ines Bloomer, M.D.  Electronically Signed   DPB/MEDQ  D:  11/15/2006  T:  11/16/2006  Job:  [id number]

## 2010-06-14 NOTE — Letter (Signed)
October 11, 2006   Windy Fast L. Earlene Plater, M.D.  [address]  [place]  [place] [address]   Re:  ELINA, STRENG               DOB:  12-10-1945   Dear Ferne Reus:   I saw Ms. Seubert back today and reviewed her PET scan.  The lesion in her  left lingula has a low uptake of about 1.7, which is thought to be  possibly more mild to moderately positive and go more toward a  metastatic lesion from renal cell than a lung primary.   Whatever it is, it is a new lesion and needs to be removed.  I have  recommended that she have a left VATS and a lingulectomy.  We have  tentatively scheduled this for November 05, 2006, at [facility].  We  will let you know our pathological findings.   I appreciate the opportunity of seeing Ms. Chery.   Ines Bloomer, M.D.  Electronically Signed   DPB/MEDQ  D:  10/11/2006  T:  10/12/2006  Job:  [id number]

## 2010-06-17 NOTE — Op Note (Signed)
Amanda Crawford, Amanda Crawford               ACCOUNT NO.:  [id number]   MEDICAL RECORD NO.:  [id number]          PATIENT TYPE:  AMB   LOCATION:  ENDO                         [facility]ACILITY:  [facility]   PHYSICIAN:  James L. Malon Kindle., M.D.DATE OF BIRTH:  April 28, 1945   DATE OF PROCEDURE:  09/28/2004  DATE OF DISCHARGE:                                 OPERATIVE REPORT   PROCEDURE:  Colonoscopy, polypectomy.   MEDICATIONS:  Fentanyl 75 mcg, Versed 7 mg IV.   SCOPE:  Olympus pediatric adjustable colonoscope.   INDICATION:  The patient has had previous adenomatous colon polyps removed.  This is done as a followup procedure.   DESCRIPTION OF PROCEDURE:  Procedure explained to the patient, and consent  was obtained.  The patient was placed in left lateral decubitus position,  and pediatric adjustable scope was inserted and advanced.  Prep excellent.  The patient had a long tortuous colon.  Using some abdominal pressure in  supine position, we were able to reach the cecum and ileocecal valve and  appendiceal orifice seen.  The scope withdrawn.  Colon carefully examined on  withdrawal.  No mucosal abnormalities seen.  Normal mucosal pattern.  No  diverticulosis.  At 25 cm from the anal verge, a 1.5 cm sessile polyp was  encountered and removed with a snare and sucked through the scope.  No other  polyps were seen.  Scope was withdrawn.  The patient tolerated the procedure  well, was maintained on low flow oxygen and pulse oximetry throughout the  procedure.   ASSESSMENT:  Sigmoid colon polyp removed in a woman with previous colon  polyps, 211.3.   PLAN:  Routine post polypectomy instructions.  Will recommend repeated  procedure in five years and yearly Hemoccults.           ______________________________  Llana Aliment Malon Kindle., M.D.     Waldron Session  D:  09/28/2004  T:  09/28/2004  Job:  [id number]   cc:   Al Decant. Janey Greaser, MD  [address]  [place]  [place] [address]  Fax: [phone]

## 2010-06-17 NOTE — Procedures (Signed)
[place]. [facility]  Patient:    NEKA, BISE                        MRN: [id number] Proc. Date: 11/15/99 Adm. Date:  [id number] Attending:  Gennie Alma CC:         Anesthesia department   Procedure Report  PROCEDURE PERFORMED:  Epidural catheter placement for postoperative pain relief.  ANESTHESIOLOGIST:  Kaylyn Layer. Michelle Piper, M.D.  I was consulted by Dr. Darvin Neighbours to place an epidural catheter into Ms. White for postoperative pain relief.  I had the opportunity to discuss the risks and benefits with the patient preoperatively and she agreed to the catheter placement.  DESCRIPTION OF PROCEDURE:  At the end of the procedure prior to emergence from anesthesia, the patient was turned to the right lateral decubitus position. The back was prepped with Betadine.  Using a #17 Tuohy needle, the epidural space was easily cannulated L3-4 interspace in the midline using loss of resistance technique.  The catheter was then threaded 5 cm into the epidural space and the needle removed.  After negative aspiration, 50 mcg of fentanyl and 10 cc of 0.5% Xylocaine was injected.  The patient was then turned supine, extubated and taken to recovery room in stable condition.  In the PACU and fentanyl/Marcaine infusion will be begun and she will be followed on the floor by the Anesthesiology service. DD:  11/15/99 TD:  11/15/99 Job: [id number]

## 2010-06-17 NOTE — Discharge Summary (Signed)
[place]. [facility]  Patient:    Amanda Crawford, Amanda Crawford                        MRN: [id number] Adm. Date:  [id number] Disc. Date: [id number] Attending:  Gennie Alma                           Discharge Summary  DIAGNOSES: 1. Cholelithiasis. 2. Left renal cell carcinoma.  PROCEDURES: 1. Laparoscopic cholecystectomy on November 15, 1999, by Dr. Mosetta Anis. 2. Left radical nephrectomy on November 15, 1999, by Dr. Earlene Plater.  HISTORY OF PRESENT ILLNESS:  This white, lovely 65 year old white female presented with abdominal pain and was found to have cholecystitis.  She underwent a CT scan of the abdomen and was found to have a large left renal mass which enhanced and was very suspicious for a renal cell carcinoma, approximately 7 cm in diameter and mid pole, projecting anteriorly.  In addition, she was noted to have cholelithiasis, cholecystitis, and elected to undergo a cholecystectomy followed by radical nephrectomy.  PAST MEDICAL HISTORY, SOCIAL HISTORY, FAMILY HISTORY, REVIEW OF SYSTEMS: Please see history and physical for full details.  PHYSICAL EXAMINATION:  VITAL SIGNS:  Afebrile, vital signs stable.  GENERAL:  Well-nourished, well-developed, in no acute distress.  Oriented x 3.  HEENT:  PERRLA.  Ears, nose, and throat clear.  NECK:  Without masses or thyromegaly.  CHEST:  Clear anterior and posterior, without rales or rhonchi.  ABDOMEN:  Soft and nontender.  No masses or organomegaly.  There is a palpable left renal mass that is nontender, and the right upper quadrant tenderness had resolved at that point.  EXTREMITIES:  Normal.  NEUROLOGIC:  Intact.  GU:  Essentially normal.  HOSPITAL COURSE:  The patient was admitted after undergoing proper preoperative evaluation.  She was subsequently taken to surgery on November 15, 1999, by Dr. Orpah Greek, who performed the laparoscopic cholecystectomy.  The left radical nephrectomy was performed on  November 15, 1999.  Postoperatively, she did very well.  Postoperative laboratories revealed a hemoglobin of 10.5, hematocrit of 30.8, white cell count of 12.7.  Chem-7 was essentially normal with blood sugar of 163.  By November 16, 1999, t-max was 99.3 degrees Fahrenheit.  She was comfortable.  Hemoglobin was 11.4, hematocrit 32.7, white blood cell count of 12.5.  BMET was essentially normal.  The NG tube was clamped, and the Foley was discontinued.  By November 17, 1999, which was postoperative day #2, t-max was 100 degrees Fahrenheit.  She tolerated the clamped NG tube, therefore, it was removed and a few liquids were begun.  She progressively improved.  By November 18, 1999, she was taking fluids and advanced.  She was subsequently discharged on November 19, 1999.  She was voiding well, tolerating a full diet.  Abdomen was soft.  She was discharged on pain medications, to follow up later the next week to have the staples removed, and discharge condition was greatly improved.  Final pathology revealed a renal cell carcinoma grade 3/4.  It was a PT2, PNX, PMX.  She also had chronic cholecystitis and cholelithiasis and cholesterolosis. DD:  12/16/99 TD:  12/16/99 Job: [id number]

## 2010-06-17 NOTE — Op Note (Signed)
[place]. [facility]  Patient:    Amanda Crawford, Amanda Crawford                        MRN: [id number] Proc. Date: 11/15/99 Adm. Date:  [id number] Attending:  Gennie Alma CC:         Lucrezia Starch. Ovidio Hanger, M.D.   Operative Report  CCS# [id number]  PREOPERATIVE DIAGNOSIS:  Chronic cholecystitis and cholelithiasis and tumor of left kidney.  POSTOPERATIVE DIAGNOSIS:  Chronic cholecystitis and cholelithiasis and tumor of left kidney.  OPERATION PERFORMED:  Laparoscopic cholecystectomy.  SURGEON:  Milus Mallick, M.D.  ASSISTANT:  Donnie Coffin. Samuella Cota, M.D.  ANESTHESIA:  General endotracheal.  DESCRIPTION OF PROCEDURE:  Under adequate general endotracheal anesthesia, the patients abdomen was prepared and draped in the usual fashion.  A short vertical incision was made in the inferior portion of the umbilicus and carried down to the deep fascia which was incised longitudinally.  The peritoneum was entered.  A pursestring suture of 0 Vicryl was applied to the fascia.  A 10 mm Hasson cannula was introduced into the abdomen and held in place with the 0 Vicryl suture.  The abdomen was inflated with carbon dioxide to 15 mmHg with an automatic insufflator and a laparoscopic videoscope was introduced into the abdomen.  Using this for direct vision, an upper midline disposable 10 mm port was inserted and two 5 mm subcostal ports.  The gallbladder was exposed and grasped with grasping forceps and placed on traction superiorly.  The patient was placed in reversed Trendelenburg position and turned to the left.  Dissection was begun in the triangle of Calot. The cystic artery was triply clipped with Endoclips and transected between the distal two clips.  The cystic duct was quadruply clipped with Endoclips and transected between the distal two clips.  The gallbladder was dissected out of the gallbladder bed of the liver using hook and spatula coagulation.  There was an entrance  made into the gallbladder and some loss of bile.  The gallbladder was therefore placed in an Endocatch bag and withdrawn from the umbilical port enclosed in the bag.  The umbilical port was reintroduced and the gallbladder bed was inspected for bleeding.  There was some oozing from superiorly and arterial bleeding from the midportion.  The arterial bleeding was controlled with Endoclips and the oozing was controlled with electrocoagulation.  Hemostasis was ascertained. The subhepatic and subphrenic spaces were irrigated with sterile saline solution until clear.  The umbilical port was withdrawn and the pursestring suture was tied.  There still was an airleak present so that a figure-of-eight suture of 0 Vicryl was placed to stop the air-leak and any potential disruption of the fascia.  The undersurface of the umbilical port closure was checked for adhesions and none was present.  The subcostal ports were withdrawn under direct vision and the abdomen was deflated and the upper midline port was removed.  All of the subcuticular layers of the port incisions were closed with continuous 5-0 Vicryl.  Half-inch Steri-Strips were applied to the skin.  The patient was kept under general endotracheal anesthesia and the operation was then turned over to Dr. Gaynelle Arabian for the purposes of undergoing a left nephrectomy which was dictated under a separate operative note. DD:  11/15/99 TD:  11/15/99 Job: [id number]

## 2010-06-17 NOTE — Op Note (Signed)
[place]. [facility]  Patient:    Amanda Crawford, Amanda Crawford                        MRN: [id number] Proc. Date: 11/15/99 Adm. Date:  [id number] Attending:  Gennie Alma CC:         Al Decant. Janey Greaser, M.D.  Milus Mallick, M.D.   Operative Report  DIAGNOSIS:  Large left renal mass.  OPERATIVE PROCEDURE:  Left radical nephrectomy.  SURGEON:  Lucrezia Starch. Ovidio Hanger, M.D.  ASSISTANT:  Milus Mallick, M.D.  ANESTHESIA:  General endotracheal.  ESTIMATED BLOOD LOSS:  350 cc.  TUBES:  16 French Foley and NG tube.  COMPLICATIONS:  None.  INDICATION FOR PROCEDURE:  Amanda Crawford is a lovely 65 year old white female who presented to the emergency room with right upper quadrant pain, nausea, and vomiting.  She subsequently underwent CT scan and was found to have cholecystitis and cholelithiasis, but additionally was found to have a 7 cm large enhancing mass of the mid pole of the kidney projecting anteriorly.  She has been appropriately worked up and does not appear to have metastatic disease and after understanding risks, benefits, and alternatives, elected to undergo cholecystectomy by Dr. Mosetta Anis using laparoscopic technique and has elected to undergo left radical nephrectomy concomitantly.  DESCRIPTION OF PROCEDURE:  After Dr. Orpah Greek had performed the laparoscopic cholecystectomy, the patients drapes were then broken down.  The patient was placed with a bump under the left flank and prepped and draped sterilely with Betadine.  A 16 French Foley catheter had been inserted in the bladder, and the bladder was drained.  A left subcostal incision was made and sharp dissection was carried down to the subcutaneous tissue.  The anterior rectus sheath and external oblique aponeuroses were incised.  The rectus muscle belly was incised as was the external, internal, and transversus abdominis muscles and fascia, and the peritoneum was entered and explored.  There  was some watery fluid from her laparoscopic cholecystectomy noted, but no significant bleeding was noted.  There was a large tumor which was adherent to the mesentery anteriorly, and large collaterals were noted.  The line of Toldt was incised laterally, and the colon was reflected medially in its plane of dissection.  The inferior pole was then identified, dissected free.  The ureter was clipped proximally and distally with large hemoclips and cut, and the lower pole was dissected free, staying well lateral to the aorta.  The gonadal vein was identified and protected.  The large collaterals were taken anteriorly and laterally, and the upper pole of the kidney was then approached and dissected down to the adrenal gland.  Again, multiple clips and ties were necessary to control any collateral bleeders.  The adrenal gland did not appear to be involved in the tumor.  Therefore, the upper pole of the adrenal gland was maintained and oversewed with suture.  The hilum was then approached.  It was a very large renal vein.  It was dissected free, and the renal artery, there appeared to be only one present, was dissected posteriorly, ligated with #1 silk ligatures, effectively devascularizing the kidney and the large tumor.  The vein was then dissected free, ligated proximally and distally with #1 silk ligatures, and cut.  The artery was similarly ligated and cut.  The specimen was removed.  The hilum was then approached, and the end of the large artery was clipped with a large hemoclip, and the  vein was oversewn with a 4-0 Prolene suture double-run type fashion. Thorough irrigation was performed.  Good hemostasis was noted to be obtained. The pancreas and spleen were totally intact.  The contents were placed back into the left upper renal fossa, and thorough irrigation was performed.  The posterior rectus sheath and transverse abdominis were closed in continuity with running #1 PDS.  The  aponeurosis and the internal oblique was then closed with running #1 PDS, and the anterior rectus sheath and external obliges fascias were closed in continuity with #1 PDS.  Thorough irrigation was performed.  Good hemostasis was noted to be present.  The skin was closed with skin staples.  The patient tolerated the procedure well and was taken to the recovery room in stable condition. DD:  11/15/99 TD:  11/15/99 Job: [id number]

## 2010-06-17 NOTE — Procedures (Signed)
[place]  Patient:    Amanda Crawford, Amanda Crawford Visit Number: [id number] MRN: [id number]          Service Type: END Location: ENDO Attending Physician:  Orland Mustard Proc. Date: 08/16/01 Admit Date:  08/16/2001 Discharge Date: 08/16/2001   CC:         Al Decant. Janey Greaser, M.D.   Procedure Report  PROCEDURE PERFORMED:  Colonoscopy and polypectomy.  ENDOSCOPIST:  Llana Aliment. Edwards, M.D.  MEDICATIONS:  Fentanyl 90 mcg and Versed 9 mg IV.  SCOPE:  Olympus adult video colonoscope.  INDICATION:  The patient had an abnormal sigmoidoscopy with a polyp in the rectum.  DESCRIPTION OF PROCEDURE:  The patient had been explained to the patient and consent obtained.  With the patient in the left lateral decubitus position, the Olympus scope was inserted and advanced under direct visualization.  The patient had a long tortuous colon.  We were able to reach the cecum.  Using abdominal pressure and position change, the cecum was identified by identification of the ileocecal valve and appendiceal orifice.  Just above the cecum was a 4 mm sessile polyp which was cauterized.  No other polyps were seen in the cecum or ascending colon.  The transverse colon was free of polyps as was the descending and sigmoid colon.  In the rectum, a 1/2 to 3/4 cm sessile polyp was removed with the snare and sucked through the scope.  A smaller polyp just near there 2-3 mm was cauterized.  Both of these were placed in the same jar.  The scope was withdrawn.  The patient tolerated the procedure well and was maintained on low flow oxygen and pulse oximeter throughout the procedure.  ASSESSMENT:  Polyps in the ascending colon and rectum removed.  PLAN:  Routine post polypectomy.  Recommend repeating in three years. Attending Physician:  Orland Mustard DD:  08/16/01 TD:  08/21/01 Job: [id number]

## 2010-07-05 ENCOUNTER — Other Ambulatory Visit: Payer: Self-pay | Admitting: Oncology

## 2010-07-05 ENCOUNTER — Encounter (HOSPITAL_BASED_OUTPATIENT_CLINIC_OR_DEPARTMENT_OTHER): Payer: Medicare Other | Admitting: Oncology

## 2010-07-05 ENCOUNTER — Encounter (HOSPITAL_COMMUNITY): Payer: Self-pay

## 2010-07-05 ENCOUNTER — Ambulatory Visit (HOSPITAL_COMMUNITY)
Admission: RE | Admit: 2010-07-05 | Discharge: 2010-07-05 | Disposition: A | Discharge status: Home / Self Care | Payer: Medicare Other | Source: Ambulatory Visit | Attending: Oncology | Admitting: Oncology

## 2010-07-05 DIAGNOSIS — C7800 Secondary malignant neoplasm of unspecified lung: Secondary | ICD-10-CM | POA: Insufficient documentation

## 2010-07-05 DIAGNOSIS — Z905 Acquired absence of kidney: Secondary | ICD-10-CM | POA: Insufficient documentation

## 2010-07-05 DIAGNOSIS — K7689 Other specified diseases of liver: Secondary | ICD-10-CM | POA: Insufficient documentation

## 2010-07-05 DIAGNOSIS — J984 Other disorders of lung: Secondary | ICD-10-CM | POA: Insufficient documentation

## 2010-07-05 DIAGNOSIS — C649 Malignant neoplasm of unspecified kidney, except renal pelvis: Secondary | ICD-10-CM

## 2010-07-05 DIAGNOSIS — C3490 Malignant neoplasm of unspecified part of unspecified bronchus or lung: Secondary | ICD-10-CM

## 2010-07-05 DIAGNOSIS — Z902 Acquired absence of lung [part of]: Secondary | ICD-10-CM | POA: Insufficient documentation

## 2010-07-05 HISTORY — DX: Malignant neoplasm of unspecified kidney, except renal pelvis: C64.9

## 2010-07-05 LAB — CBC WITH DIFFERENTIAL/PLATELET
Basophils Absolute: 0.1 10*3/uL (ref 0.0–0.1)
Eosinophils Absolute: 0.2 10*3/uL (ref 0.0–0.5)
HGB: 15.7 g/dL (ref 11.6–15.9)
MCV: 90 fL (ref 79.5–101.0)
MONO#: 0.7 10*3/uL (ref 0.1–0.9)
NEUT#: 8.7 10*3/uL — ABNORMAL HIGH (ref 1.5–6.5)
RBC: 5.04 10*6/uL (ref 3.70–5.45)
RDW: 13.4 % (ref 11.2–14.5)
WBC: 13.3 10*3/uL — ABNORMAL HIGH (ref 3.9–10.3)
lymph#: 3.7 10*3/uL — ABNORMAL HIGH (ref 0.9–3.3)

## 2010-07-05 LAB — CMP (CANCER CENTER ONLY)
Albumin: 4.1 g/dL (ref 3.3–5.5)
BUN, Bld: 25 mg/dL — ABNORMAL HIGH (ref 7–22)
CO2: 29 mEq/L (ref 18–33)
Calcium: 9.9 mg/dL (ref 8.0–10.3)
Chloride: 96 mEq/L — ABNORMAL LOW (ref 98–108)
Glucose, Bld: 109 mg/dL (ref 73–118)
Potassium: 4.9 mEq/L — ABNORMAL HIGH (ref 3.3–4.7)
Sodium: 137 mEq/L (ref 128–145)
Total Protein: 8.6 g/dL — ABNORMAL HIGH (ref 6.4–8.1)

## 2010-07-05 MED ORDER — IOHEXOL 300 MG/ML  SOLN
100.0000 mL | Freq: Once | INTRAMUSCULAR | Status: AC | PRN
  Administered 2010-07-05: 100 mL via INTRAVENOUS

## 2010-07-07 ENCOUNTER — Encounter (HOSPITAL_BASED_OUTPATIENT_CLINIC_OR_DEPARTMENT_OTHER): Payer: Medicare Other | Admitting: Oncology

## 2010-07-07 DIAGNOSIS — C649 Malignant neoplasm of unspecified kidney, except renal pelvis: Secondary | ICD-10-CM

## 2010-09-16 ENCOUNTER — Other Ambulatory Visit (HOSPITAL_COMMUNITY): Payer: Self-pay | Admitting: Family Medicine

## 2010-09-16 DIAGNOSIS — Z1231 Encounter for screening mammogram for malignant neoplasm of breast: Secondary | ICD-10-CM

## 2010-10-07 ENCOUNTER — Ambulatory Visit (HOSPITAL_COMMUNITY)
Admission: RE | Admit: 2010-10-07 | Discharge: 2010-10-07 | Disposition: A | Discharge status: Home / Self Care | Payer: Medicare Other | Source: Ambulatory Visit | Attending: Family Medicine | Admitting: Family Medicine

## 2010-10-07 DIAGNOSIS — Z1231 Encounter for screening mammogram for malignant neoplasm of breast: Secondary | ICD-10-CM | POA: Insufficient documentation

## 2010-10-12 ENCOUNTER — Other Ambulatory Visit: Payer: Self-pay | Admitting: Family Medicine

## 2010-10-12 DIAGNOSIS — R928 Other abnormal and inconclusive findings on diagnostic imaging of breast: Secondary | ICD-10-CM

## 2010-10-19 LAB — CBC
MCV: 88.5
Platelets: 292
Platelets: 307
RDW: 12.8
WBC: 8.3

## 2010-10-19 LAB — BASIC METABOLIC PANEL
BUN: 13
Chloride: 106
Creatinine, Ser: 0.88

## 2010-10-19 LAB — LIPID PANEL
HDL: 49
LDL Cholesterol: 117 — ABNORMAL HIGH
Total CHOL/HDL Ratio: 4.3
Triglycerides: 214 — ABNORMAL HIGH
VLDL: 43 — ABNORMAL HIGH

## 2010-10-19 LAB — CK TOTAL AND CKMB (NOT AT ARMC)
CK, MB: 7.8 — ABNORMAL HIGH
Relative Index: 6 — ABNORMAL HIGH

## 2010-10-19 LAB — APTT: aPTT: 37

## 2010-10-21 ENCOUNTER — Ambulatory Visit
Admission: RE | Admit: 2010-10-21 | Discharge: 2010-10-21 | Disposition: A | Discharge status: Home / Self Care | Payer: Medicare Other | Source: Ambulatory Visit | Attending: Family Medicine | Admitting: Family Medicine

## 2010-10-21 DIAGNOSIS — R928 Other abnormal and inconclusive findings on diagnostic imaging of breast: Secondary | ICD-10-CM

## 2010-11-04 LAB — I-STAT 8, (EC8 V) (CONVERTED LAB)
Acid-base deficit: 2
BUN: 17
Chloride: 108
Glucose, Bld: 85
pCO2, Ven: 39.5 — ABNORMAL LOW
pH, Ven: 7.381 — ABNORMAL HIGH

## 2010-11-04 LAB — COMPREHENSIVE METABOLIC PANEL
ALT: 82 — ABNORMAL HIGH
AST: 59 — ABNORMAL HIGH
Calcium: 9.7
Creatinine, Ser: 0.9
GFR calc Af Amer: >60
Sodium: 137
Total Protein: 6.9

## 2010-11-04 LAB — POCT CARDIAC MARKERS
CKMB, poc: 6.4
Myoglobin, poc: 107
Operator id: 288831
Troponin i, poc: <0.05

## 2010-11-04 LAB — CBC
MCHC: 35.5
Platelets: 280
RDW: 12.8

## 2010-11-04 LAB — POCT I-STAT CREATININE: Creatinine, Ser: 1.1

## 2010-11-04 LAB — PROTIME-INR: Prothrombin Time: 12.5

## 2010-11-10 LAB — BASIC METABOLIC PANEL
BUN: 13
CO2: 25
CO2: 26
Calcium: 8.5
Chloride: 105
GFR calc Af Amer: 58 — ABNORMAL LOW
GFR calc Af Amer: >60
GFR calc Af Amer: >60
GFR calc non Af Amer: 48 — ABNORMAL LOW
GFR calc non Af Amer: 52 — ABNORMAL LOW
Glucose, Bld: 159 — ABNORMAL HIGH
Glucose, Bld: 97
Potassium: 3.9
Potassium: 4.3
Potassium: 4.8
Sodium: 134 — ABNORMAL LOW
Sodium: 137
Sodium: 137

## 2010-11-10 LAB — TYPE AND SCREEN: ABO/RH(D): A POS

## 2010-11-10 LAB — COMPREHENSIVE METABOLIC PANEL
BUN: 10
CO2: 26
Calcium: 8.9
Chloride: 102
Creatinine, Ser: 1.08
GFR calc non Af Amer: 51 — ABNORMAL LOW
Glucose, Bld: 153 — ABNORMAL HIGH
Total Bilirubin: 0.8

## 2010-11-10 LAB — BLOOD GAS, ARTERIAL
Bicarbonate: 21
Bicarbonate: 22.8
FIO2: 0.21
Patient temperature: 99.3
TCO2: 24
pCO2 arterial: 34.5 — ABNORMAL LOW
pH, Arterial: 7.389
pH, Arterial: 7.401 — ABNORMAL HIGH
pO2, Arterial: 49.4 — ABNORMAL LOW
pO2, Arterial: 71.5 — ABNORMAL LOW

## 2010-11-10 LAB — CBC
HCT: 36.4
HCT: 38.9
HCT: 39.1
HCT: 44.7
Hemoglobin: 12.5
Hemoglobin: 13.4
Hemoglobin: 15.4 — ABNORMAL HIGH
MCHC: 34.3
MCHC: 34.3
MCV: 90.9
Platelets: 232
RBC: 4
RBC: 4.28
RBC: 4.36
RBC: 4.96
RDW: 12.6
RDW: 12.8
WBC: 15.4 — ABNORMAL HIGH
WBC: 17.5 — ABNORMAL HIGH

## 2010-11-10 LAB — URINALYSIS, ROUTINE W REFLEX MICROSCOPIC
Bilirubin Urine: NEGATIVE
Nitrite: NEGATIVE
Specific Gravity, Urine: 1.025
Urobilinogen, UA: 0.2
pH: 5.5

## 2010-11-10 LAB — HEPATIC FUNCTION PANEL
AST: 56 — ABNORMAL HIGH
Albumin: 3.9
Total Bilirubin: 0.6
Total Protein: 7.4

## 2010-11-10 LAB — APTT: aPTT: 31

## 2010-11-10 LAB — PROTIME-INR: INR: 0.9

## 2011-05-11 ENCOUNTER — Other Ambulatory Visit: Payer: Self-pay | Admitting: Oncology

## 2011-05-11 DIAGNOSIS — C649 Malignant neoplasm of unspecified kidney, except renal pelvis: Secondary | ICD-10-CM

## 2011-05-12 ENCOUNTER — Telehealth: Payer: Self-pay | Admitting: Oncology

## 2011-05-12 NOTE — Telephone Encounter (Signed)
S/w pt today re appt for 4/16

## 2011-05-16 ENCOUNTER — Telehealth: Payer: Self-pay | Admitting: Oncology

## 2011-05-16 ENCOUNTER — Ambulatory Visit (HOSPITAL_BASED_OUTPATIENT_CLINIC_OR_DEPARTMENT_OTHER): Payer: Medicare Other | Admitting: Oncology

## 2011-05-16 ENCOUNTER — Other Ambulatory Visit (HOSPITAL_BASED_OUTPATIENT_CLINIC_OR_DEPARTMENT_OTHER): Payer: Medicare Other

## 2011-05-16 VITALS — BP 142/74 | HR 77 | Temp 98.1°F | Ht 64.0 in | Wt 227.0 lb

## 2011-05-16 DIAGNOSIS — C3490 Malignant neoplasm of unspecified part of unspecified bronchus or lung: Secondary | ICD-10-CM

## 2011-05-16 DIAGNOSIS — C649 Malignant neoplasm of unspecified kidney, except renal pelvis: Secondary | ICD-10-CM

## 2011-05-16 LAB — CBC WITH DIFFERENTIAL/PLATELET
Basophils Absolute: 0.1 10*3/uL (ref 0.0–0.1)
EOS%: 2.5 % (ref 0.0–7.0)
HCT: 41.5 % (ref 34.8–46.6)
HGB: 13.9 g/dL (ref 11.6–15.9)
LYMPH%: 36.9 % (ref 14.0–49.7)
MCH: 30.4 pg (ref 25.1–34.0)
NEUT%: 52.1 % (ref 38.4–76.8)
Platelets: 285 10*3/uL (ref 145–400)
lymph#: 3.7 10*3/uL — ABNORMAL HIGH (ref 0.9–3.3)

## 2011-05-16 LAB — COMPREHENSIVE METABOLIC PANEL
Albumin: 4.2 g/dL (ref 3.5–5.2)
BUN: 18 mg/dL (ref 6–23)
Calcium: 10.1 mg/dL (ref 8.4–10.5)
Chloride: 105 mEq/L (ref 96–112)
Glucose, Bld: 87 mg/dL (ref 70–99)
Potassium: 4.8 mEq/L (ref 3.5–5.3)

## 2011-05-16 NOTE — Progress Notes (Signed)
Hematology and Oncology Follow Up Visit  Amanda Crawford [id number] 1945/09/07 66 y.o. 05/16/2011 12:09 PM  CC: Amanda Crawford, M.D.  Amanda Crawford, M.D.    Principle Diagnosis: This is a 66 year old female diagnosed with renal cell carcinoma, diagnosed in 2001, had a T2 disease and had recurrent disease in a lung nodule in 2000.   Prior Therapy: She is status post left nephrectomy in 2001. She developed a left lingular mass, status post VATS procedure.  Pathology revealed a recurrent renal cell carcinoma resected in October 2008.  Has been really clear of disease since that time.  Current therapy: Observation and surveillance.   Interim History: Amanda Crawford presents today for a followup visit.  She has been doing relatively well.  She had not reported any neurological symptoms.  Had not reported any peripheral neuropathy, not about any loss of bowel or bladder function.  Really her pain again appears to be more arthritic in nature and it eases off with activity.  She did not report any constitutional symptoms, did not report any weight loss, did not report any difficulty breathing.  She, again,  continued to perform more activities of daily living without any major hindrance or decline.  Medications: I have reviewed the patient's current medications. Current outpatient prescriptions:diphenhydramine-acetaminophen (TYLENOL PM) 25-500 MG TABS, Take 1 tablet by mouth at bedtime as needed. For insomnia, Disp: , Rfl: ;  ergocalciferol (VITAMIN D2) 50000 UNITS capsule, Take 50,000 Units by mouth once a week., Disp: , Rfl: ;  fish oil-omega-3 fatty acids 1000 MG capsule, Take 2 g by mouth daily., Disp: , Rfl:   Allergies: No Known Allergies  Past Medical History, Surgical history, Social history, and Family History were reviewed and updated.  Review of Systems: Constitutional:  Negative for fever, chills, night sweats, anorexia, weight loss, pain. Cardiovascular: no chest pain or dyspnea on  exertion Respiratory: negative Neurological: negative Dermatological: negative ENT: negative Skin: Negative. Gastrointestinal: negative Genito-Urinary: negative Hematological and Lymphatic: negative Breast: negative Musculoskeletal: negative Remaining ROS negative. Physical Exam: Blood pressure 142/74, pulse 77, temperature 98.1 F (36.7 C), temperature source Oral, height 5\' 4"  (1.626 m), weight 227 lb (102.967 kg). ECOG: 1 General appearance: alert Head: Normocephalic, without obvious abnormality, atraumatic Neck: no adenopathy, no carotid bruit, no JVD, supple, symmetrical, trachea midline and thyroid not enlarged, symmetric, no tenderness/mass/nodules Lymph nodes: Cervical, supraclavicular, and axillary nodes normal. Heart:regular rate and rhythm, S1, S2 normal, no murmur, click, rub or gallop Lung:chest clear, no wheezing, rales, normal symmetric air entry Abdomin: soft, non-tender, without masses or organomegaly EXT:no erythema, induration, or nodules   Lab Results: Lab Results  Component Value Date   WBC 10.0 05/16/2011   HGB 13.9 05/16/2011   HCT 41.5 05/16/2011   MCV 90.7 05/16/2011   PLT 285 05/16/2011     Chemistry      Component Value Date/Time   NA 137 07/05/2010 1117   NA 137 01/05/2010 0934   K 4.9* 07/05/2010 1117   K 4.6 01/05/2010 0934   CL 96* 07/05/2010 1117   CL 105 01/05/2010 0934   CO2 29 07/05/2010 1117   CO2 22 01/05/2010 0934   BUN 25* 07/05/2010 1117   BUN 17 01/05/2010 0934   CREATININE 1.4* 07/05/2010 1117   CREATININE 1.10 01/05/2010 0934      Component Value Date/Time   CALCIUM 9.9 07/05/2010 1117   CALCIUM 10.4 01/05/2010 0934   ALKPHOS 65 07/05/2010 1117   ALKPHOS 67 01/05/2010 0934   AST  34 07/05/2010 1117   AST 31 01/05/2010 0934   ALT 34 01/05/2010 0934   BILITOT 1.00 07/05/2010 1117   BILITOT 0.5 01/05/2010 0934       Impression and Plan:   This is a pleasant 66 year-old female with the following issues: 1. Renal cell carcinoma.  She had stage IV  disease with really no evidence of any detectable disease at this point.  She had a lung nodule that was resected over 4 years ago and another small 4-mm nodule that has been stable and, again, under surveillance.  The plan is to continue with clinical surveillance and imaging studies next week and every 6 months.    2.     Back pain: resolved.    [facility], MD 4/16/201312:09 PM

## 2011-05-16 NOTE — Telephone Encounter (Signed)
appts made and printed for pt aom °

## 2011-05-23 ENCOUNTER — Ambulatory Visit (HOSPITAL_COMMUNITY)
Admission: RE | Admit: 2011-05-23 | Discharge: 2011-05-23 | Disposition: A | Discharge status: Home / Self Care | Payer: Medicare Other | Source: Ambulatory Visit | Attending: Oncology | Admitting: Oncology

## 2011-05-23 DIAGNOSIS — K7689 Other specified diseases of liver: Secondary | ICD-10-CM | POA: Insufficient documentation

## 2011-05-23 DIAGNOSIS — R911 Solitary pulmonary nodule: Secondary | ICD-10-CM | POA: Insufficient documentation

## 2011-05-23 DIAGNOSIS — Z9089 Acquired absence of other organs: Secondary | ICD-10-CM | POA: Insufficient documentation

## 2011-05-23 DIAGNOSIS — C649 Malignant neoplasm of unspecified kidney, except renal pelvis: Secondary | ICD-10-CM | POA: Insufficient documentation

## 2011-05-23 DIAGNOSIS — I2510 Atherosclerotic heart disease of native coronary artery without angina pectoris: Secondary | ICD-10-CM | POA: Insufficient documentation

## 2011-05-23 DIAGNOSIS — Z905 Acquired absence of kidney: Secondary | ICD-10-CM | POA: Insufficient documentation

## 2011-05-23 DIAGNOSIS — I700 Atherosclerosis of aorta: Secondary | ICD-10-CM | POA: Insufficient documentation

## 2011-05-23 DIAGNOSIS — K5730 Diverticulosis of large intestine without perforation or abscess without bleeding: Secondary | ICD-10-CM | POA: Insufficient documentation

## 2011-05-23 DIAGNOSIS — C3490 Malignant neoplasm of unspecified part of unspecified bronchus or lung: Secondary | ICD-10-CM

## 2011-05-23 DIAGNOSIS — R109 Unspecified abdominal pain: Secondary | ICD-10-CM | POA: Insufficient documentation

## 2011-05-23 MED ORDER — IOHEXOL 300 MG/ML  SOLN
100.0000 mL | Freq: Once | INTRAMUSCULAR | Status: AC | PRN
  Administered 2011-05-23: 100 mL via INTRAVENOUS

## 2011-06-01 ENCOUNTER — Other Ambulatory Visit: Payer: Self-pay | Admitting: Oncology

## 2011-06-01 ENCOUNTER — Telehealth: Payer: Self-pay

## 2011-06-01 DIAGNOSIS — C3490 Malignant neoplasm of unspecified part of unspecified bronchus or lung: Secondary | ICD-10-CM

## 2011-06-01 NOTE — Progress Notes (Signed)
Results of the CT scan on 05/23/2011. 9 mm nodule noted that warrents follow up.  She is agreeable to repeat CT scan in 10/2011 before her next visit.  If the this nodule is bigger, will consider further resection of that nodule.

## 2011-06-02 ENCOUNTER — Telehealth: Payer: Self-pay | Admitting: Oncology

## 2011-06-02 NOTE — Telephone Encounter (Signed)
Added ct for 10/11. Moved 10/16 lb to 10/11 before ct. S/w pt today re ct and also confirmed changes re lb. All d/t's confirmed. Lb/ct 10/11 @ 9:30 am and 10:30 am and f/u 10/16. New schedule mailed today and pt will get prep prior to 10/11.

## 2011-10-04 ENCOUNTER — Other Ambulatory Visit (HOSPITAL_COMMUNITY): Payer: Self-pay | Admitting: Family Medicine

## 2011-10-04 DIAGNOSIS — Z1231 Encounter for screening mammogram for malignant neoplasm of breast: Secondary | ICD-10-CM

## 2011-10-18 ENCOUNTER — Ambulatory Visit (HOSPITAL_COMMUNITY)
Admission: RE | Admit: 2011-10-18 | Discharge: 2011-10-18 | Disposition: A | Discharge status: Home / Self Care | Payer: Medicare Other | Source: Ambulatory Visit | Attending: Family Medicine | Admitting: Family Medicine

## 2011-10-18 DIAGNOSIS — Z1231 Encounter for screening mammogram for malignant neoplasm of breast: Secondary | ICD-10-CM | POA: Insufficient documentation

## 2011-11-01 ENCOUNTER — Telehealth: Payer: Self-pay | Admitting: Oncology

## 2011-11-01 NOTE — Telephone Encounter (Signed)
Gave pt appt calendar for October for lab and CT, gave pt oral contrast, NPO after 6:30am

## 2011-11-10 ENCOUNTER — Ambulatory Visit (HOSPITAL_COMMUNITY)
Admission: RE | Admit: 2011-11-10 | Discharge: 2011-11-10 | Disposition: A | Discharge status: Home / Self Care | Payer: Medicare Other | Source: Ambulatory Visit | Attending: Oncology | Admitting: Oncology

## 2011-11-10 ENCOUNTER — Other Ambulatory Visit: Payer: Medicare Other | Admitting: Lab

## 2011-11-10 DIAGNOSIS — C649 Malignant neoplasm of unspecified kidney, except renal pelvis: Secondary | ICD-10-CM | POA: Insufficient documentation

## 2011-11-10 DIAGNOSIS — C3490 Malignant neoplasm of unspecified part of unspecified bronchus or lung: Secondary | ICD-10-CM

## 2011-11-10 DIAGNOSIS — Z90710 Acquired absence of both cervix and uterus: Secondary | ICD-10-CM | POA: Insufficient documentation

## 2011-11-10 DIAGNOSIS — Z9089 Acquired absence of other organs: Secondary | ICD-10-CM | POA: Insufficient documentation

## 2011-11-10 DIAGNOSIS — Z905 Acquired absence of kidney: Secondary | ICD-10-CM | POA: Insufficient documentation

## 2011-11-10 DIAGNOSIS — Z902 Acquired absence of lung [part of]: Secondary | ICD-10-CM | POA: Insufficient documentation

## 2011-11-10 DIAGNOSIS — C7800 Secondary malignant neoplasm of unspecified lung: Secondary | ICD-10-CM | POA: Insufficient documentation

## 2011-11-10 LAB — CBC WITH DIFFERENTIAL/PLATELET
Basophils Absolute: 0.1 10*3/uL (ref 0.0–0.1)
Eosinophils Absolute: 0.2 10*3/uL (ref 0.0–0.5)
HGB: 14.4 g/dL (ref 11.6–15.9)
MONO#: 0.6 10*3/uL (ref 0.1–0.9)
NEUT#: 5.7 10*3/uL (ref 1.5–6.5)
RDW: 13.3 % (ref 11.2–14.5)
lymph#: 3 10*3/uL (ref 0.9–3.3)

## 2011-11-10 LAB — COMPREHENSIVE METABOLIC PANEL (CC13)
AST: 31 U/L (ref 5–34)
Albumin: 3.8 g/dL (ref 3.5–5.0)
BUN: 19 mg/dL (ref 7.0–26.0)
Calcium: 10.1 mg/dL (ref 8.4–10.4)
Chloride: 107 mEq/L (ref 98–107)
Glucose: 108 mg/dl — ABNORMAL HIGH (ref 70–99)
Potassium: 4.5 mEq/L (ref 3.5–5.1)

## 2011-11-10 MED ORDER — IOHEXOL 300 MG/ML  SOLN: 100.0000 mL | Freq: Once | INTRAMUSCULAR | Status: AC | PRN

## 2011-11-15 ENCOUNTER — Telehealth: Payer: Self-pay | Admitting: Oncology

## 2011-11-15 ENCOUNTER — Other Ambulatory Visit: Payer: Medicare Other | Admitting: Lab

## 2011-11-15 ENCOUNTER — Ambulatory Visit (HOSPITAL_BASED_OUTPATIENT_CLINIC_OR_DEPARTMENT_OTHER): Payer: Medicare Other | Admitting: Oncology

## 2011-11-15 VITALS — BP 137/76 | HR 74 | Temp 98.4°F | Resp 20 | Ht 64.0 in | Wt 220.0 lb

## 2011-11-15 DIAGNOSIS — C649 Malignant neoplasm of unspecified kidney, except renal pelvis: Secondary | ICD-10-CM

## 2011-11-15 DIAGNOSIS — C7800 Secondary malignant neoplasm of unspecified lung: Secondary | ICD-10-CM

## 2011-11-15 NOTE — Progress Notes (Signed)
Hematology and Oncology Follow Up Visit  Amanda Crawford [id number] 1945-11-19 66 y.o. 11/15/2011 10:38 AM  CC: Ines Bloomer, M.D.  Anna Genre Little, M.D.    Principle Diagnosis: This is a 66 year old female diagnosed with renal cell carcinoma, diagnosed in 2001, had a T2 disease and had recurrent disease in a lung nodule in 2000.  Prior Therapy: She is status post left nephrectomy in 2001. She developed a left lingular mass, status post VATS procedure.  Pathology revealed a recurrent renal cell carcinoma resected in October 2008.  Has been really clear of disease since that time.  Current therapy: Observation and surveillance.  Interim History: Amanda Crawford presents today for a followup visit.  She has been doing relatively well.  She had not reported any neurological symptoms.  Had not reported any peripheral neuropathy, not about any loss of bowel or bladder function.  Really her pain again appears to be more arthritic in nature and it eases off with activity.  She did not report any constitutional symptoms, did not report any weight loss, did not report any difficulty breathing.  She continued to perform more activities of daily living without any major hindrance or decline.  Medications: I have reviewed the patient's current medications. Current outpatient prescriptions:diphenhydramine-acetaminophen (TYLENOL PM) 25-500 MG TABS, Take 1 tablet by mouth at bedtime as needed. For insomnia, Disp: , Rfl: ;  ergocalciferol (VITAMIN D2) 50000 UNITS capsule, Take 50,000 Units by mouth once a week., Disp: , Rfl: ;  fish oil-omega-3 fatty acids 1000 MG capsule, Take 2 g by mouth daily., Disp: , Rfl:   Allergies: No Known Allergies  Past Medical History, Surgical history, Social history, and Family History were reviewed and updated.  Review of Systems: Constitutional:  Negative for fever, chills, night sweats, anorexia, weight loss, pain. Cardiovascular: no chest pain or dyspnea on  exertion Respiratory: negative Neurological: negative Dermatological: negative ENT: negative Skin: Negative. Gastrointestinal: negative Genito-Urinary: negative Hematological and Lymphatic: negative Breast: negative Musculoskeletal: negative Remaining ROS negative. Physical Exam: Blood pressure 137/76, pulse 74, temperature 98.4 F (36.9 C), temperature source Oral, resp. rate 20, height 5\' 4"  (1.626 m), weight 220 lb (99.791 kg). ECOG: 1 General appearance: alert Head: Normocephalic, without obvious abnormality, atraumatic Neck: no adenopathy, no carotid bruit, no JVD, supple, symmetrical, trachea midline and thyroid not enlarged, symmetric, no tenderness/mass/nodules Lymph nodes: Cervical, supraclavicular, and axillary nodes normal. Heart:regular rate and rhythm, S1, S2 normal, no murmur, click, rub or gallop Lung:chest clear, no wheezing, rales, normal symmetric air entry Abdomin: soft, non-tender, without masses or organomegaly EXT:no erythema, induration, or nodules   Lab Results: Lab Results  Component Value Date   WBC 9.6 11/10/2011   HGB 14.4 11/10/2011   HCT 43.4 11/10/2011   MCV 91.2 11/10/2011   PLT 295 11/10/2011     Chemistry      Component Value Date/Time   NA 140 11/10/2011 0908   NA 138 05/16/2011 1134   NA 137 07/05/2010 1117   K 4.5 11/10/2011 0908   K 4.8 05/16/2011 1134   K 4.9* 07/05/2010 1117   CL 107 11/10/2011 0908   CL 105 05/16/2011 1134   CL 96* 07/05/2010 1117   CO2 21* 11/10/2011 0908   CO2 24 05/16/2011 1134   CO2 29 07/05/2010 1117   BUN 19.0 11/10/2011 0908   BUN 18 05/16/2011 1134   BUN 25* 07/05/2010 1117   CREATININE 1.1 11/10/2011 0908   CREATININE 1.04 05/16/2011 1134   CREATININE 1.4* 07/05/2010 1117  Component Value Date/Time   CALCIUM 10.1 11/10/2011 0908   CALCIUM 10.1 05/16/2011 1134   CALCIUM 9.9 07/05/2010 1117   ALKPHOS 69 11/10/2011 0908   ALKPHOS 59 05/16/2011 1134   ALKPHOS 65 07/05/2010 1117   AST 31 11/10/2011 0908   AST  42* 05/16/2011 1134   AST 34 07/05/2010 1117   ALT 40 11/10/2011 0908   ALT 48* 05/16/2011 1134   BILITOT 0.40 11/10/2011 0908   BILITOT 0.4 05/16/2011 1134   BILITOT 1.00 07/05/2010 1117     CT CHEST  Findings: No axillary or supraclavicular lymphadenopathy. No  mediastinal or hilar lymphadenopathy. No pericardial fluid.  Review of the lung parenchyma demonstrates a left upper lobe nodule  which measures 10 mm x 9 mm compared to 9 mm x 8 mm on prior for a  mild interval increase in size. There is postsurgical scarring at  the left lung apex which is stable. No new pulmonary nodules are  present.   Impression and Plan:   This is a pleasant 66 year-old female with the following issues: 1. Renal cell carcinoma.  She had stage IV disease with really no evidence of any detectable disease at this point.  She had a lung nodule that was resected over 4 years ago and another small  9 X 10 mm nodule that has been stable and, again, under surveillance.  The plan is to continue with clinical surveillance and imaging studies in 6 months. If this nodule grows in the interval, we will consider surgical resection at that time.  2.     Back pain: resolved.    Lonnel Gjerde, MD 10/16/201310:38 AM

## 2011-11-15 NOTE — Telephone Encounter (Signed)
appts made and printed for pt  °

## 2012-03-31 ENCOUNTER — Encounter: Payer: Self-pay | Admitting: Emergency Medicine

## 2012-03-31 ENCOUNTER — Emergency Department (INDEPENDENT_AMBULATORY_CARE_PROVIDER_SITE_OTHER)
Admission: EM | Admit: 2012-03-31 | Discharge: 2012-03-31 | Disposition: A | Discharge status: Home / Self Care | Payer: Medicare Other | Source: Home / Self Care | Attending: Family Medicine | Admitting: Family Medicine

## 2012-03-31 DIAGNOSIS — M546 Pain in thoracic spine: Secondary | ICD-10-CM

## 2012-03-31 DIAGNOSIS — J069 Acute upper respiratory infection, unspecified: Secondary | ICD-10-CM

## 2012-03-31 HISTORY — DX: Malignant neoplasm of unspecified part of unspecified bronchus or lung: C34.90

## 2012-03-31 MED ORDER — DOXYCYCLINE HYCLATE 100 MG PO CAPS: 100.0000 mg | ORAL_CAPSULE | Freq: Two times a day (BID) | ORAL | Status: DC

## 2012-03-31 MED ORDER — HYDROCODONE-ACETAMINOPHEN 5-325 MG PO TABS: ORAL_TABLET | ORAL | Status: DC

## 2012-03-31 NOTE — ED Provider Notes (Signed)
History     CSN: [id number]  Arrival date & time 03/31/12  1317   First MD Initiated Contact with Patient 03/31/12 1342      Chief Complaint  Patient presents with  . Nasal Congestion  . Cough  . Chest Pain       HPI Comments: Patient complains of onset of URI symptoms about 6 days ago with mild sore throat, followed by sinus congestion, fatigue, chills, productive cough, and myalgias.  She has had wheezing but no shortness of breath.  Over the past two days she has developed intermittent ache in her left upper back and scapular area.  She has a history of metastatic renal cell CA and is followed by chest CT scans Q31months.  The history is provided by the patient and the spouse.    Past Medical History  Diagnosis Date  . Renal cell cancer     renal cell ca dx 9/01 and 8/08;  . Lung cancer     Past Surgical History  Procedure Laterality Date  . Abdominal hysterectomy    . Lung cancer surgery    . Renal mass excision      Family History  Problem Relation Age of Onset  . Heart failure Father     History  Substance Use Topics  . Smoking status: Former Games developer  . Smokeless tobacco: Not on file  . Alcohol Use: No    OB History   Grav Para Term Preterm Abortions TAB SAB Ect Mult Living                  Review of Systems + sore throat + cough No pleuritic pain, but complains of pain in left upper back/scapular area + wheezing + nasal congestion + post-nasal drainage No sinus pain/pressure No itchy/red eyes No earache No hemoptysis No SOB No fever, + chills No nausea No vomiting No abdominal pain No diarrhea No urinary symptoms No skin rashes + fatigue + myalgias + headache    Allergies  Review of patient's allergies indicates no known allergies.  Home Medications   Current Outpatient Rx  Name  Route  Sig  Dispense  Refill  . diphenhydramine-acetaminophen (TYLENOL PM) 25-500 MG TABS   Oral   Take 1 tablet by mouth at bedtime as needed. For  insomnia         . doxycycline (VIBRAMYCIN) 100 MG capsule   Oral   Take 1 capsule (100 mg total) by mouth 2 (two) times daily.   20 capsule   0   . ergocalciferol (VITAMIN D2) 50000 UNITS capsule   Oral   Take 50,000 Units by mouth once a week.         . fish oil-omega-3 fatty acids 1000 MG capsule   Oral   Take 2 g by mouth daily.         Marland Kitchen HYDROcodone-acetaminophen (NORCO/VICODIN) 5-325 MG per tablet      Take one by mouth at bedtime as needed for pain   10 tablet   0     BP 129/82  Pulse 91  Temp(Src) 98.2 F (36.8 C) (Oral)  Ht 5\' 4"  (1.626 m)  Wt 210 lb (95.255 kg)  BMI 36.03 kg/m2  SpO2 93%  Physical Exam Nursing notes and Vital Signs reviewed. Appearance:  Patient appears stated age, and in no acute distress.  Patient is obese (BMI 36.0) Eyes:  Pupils are equal, round, and reactive to light and accomodation.  Extraocular movement is intact.  Conjunctivae  are not inflamed  Ears:  Canals normal.  Tympanic membranes normal.  Nose:  Mildly congested turbinates.  No sinus tenderness.   Pharynx:  Normal Neck:  Supple.  Slightly tender shotty posterior nodes are palpated bilaterally  Lungs:  Clear to auscultation.  Breath sounds are equal.  Chest:  No tenderness to palpation Heart:  Regular rate and rhythm without murmurs, rubs, or gallops.  Abdomen:  Nontender without masses or hepatosplenomegaly.  Bowel sounds are present.  No CVA or flank tenderness.  Extremities:  No edema.  No calf tenderness Skin:  No rash present.   ED Course  Procedures  none      1. Acute upper respiratory infections of unspecified site   2. Acute thoracic back pain;  Chart reviewed.  Note history of metastatic renal cell CA, s/p left lung surgery 10/2006;  Patient undergoes chest imaging studies Q6 months.       MDM  Because chest exam is clear today, will defer chest X-ray.  Begin doxycycline for 10 days.  Lortab at bedtime for left upper back pain and cough. Take plain  Mucinex (guaifenesin) twice daily for cough and congestion.  May add Sudafed for sinus congestion.  Increase fluid intake, rest. May use Afrin nasal spray (or generic oxymetazoline) twice daily for about 5 days.  Also recommend using saline nasal spray several times daily and saline nasal irrigation (AYR is a common brand) Stop all antihistamines for now, and other non-prescription cough/cold preparations. May take Ibuprofen 200mg , 4 tabs every 8 hours with food for chest discomfort If symptoms become significantly worse during the night or over the weekend, proceed to the local emergency room.  Followup with Dr. Clelia Croft in one week (patient is due for CT scan chest next month).        Lattie Haw, MD 04/01/12 [phone]

## 2012-03-31 NOTE — ED Notes (Signed)
Reports starting congestion and cough x 6 days ago; now upper back/ribs are sore. No OTCs today.

## 2012-04-16 ENCOUNTER — Ambulatory Visit (HOSPITAL_COMMUNITY)
Admission: RE | Admit: 2012-04-16 | Discharge: 2012-04-16 | Disposition: A | Discharge status: Home / Self Care | Payer: Medicare Other | Source: Ambulatory Visit | Attending: Oncology | Admitting: Oncology

## 2012-04-16 ENCOUNTER — Other Ambulatory Visit (HOSPITAL_BASED_OUTPATIENT_CLINIC_OR_DEPARTMENT_OTHER): Payer: Medicare Other | Admitting: Lab

## 2012-04-16 DIAGNOSIS — C7800 Secondary malignant neoplasm of unspecified lung: Secondary | ICD-10-CM | POA: Insufficient documentation

## 2012-04-16 DIAGNOSIS — Z9089 Acquired absence of other organs: Secondary | ICD-10-CM | POA: Insufficient documentation

## 2012-04-16 DIAGNOSIS — K5730 Diverticulosis of large intestine without perforation or abscess without bleeding: Secondary | ICD-10-CM | POA: Insufficient documentation

## 2012-04-16 DIAGNOSIS — I700 Atherosclerosis of aorta: Secondary | ICD-10-CM | POA: Insufficient documentation

## 2012-04-16 DIAGNOSIS — C649 Malignant neoplasm of unspecified kidney, except renal pelvis: Secondary | ICD-10-CM

## 2012-04-16 DIAGNOSIS — Z905 Acquired absence of kidney: Secondary | ICD-10-CM | POA: Insufficient documentation

## 2012-04-16 DIAGNOSIS — M545 Low back pain, unspecified: Secondary | ICD-10-CM | POA: Insufficient documentation

## 2012-04-16 DIAGNOSIS — Z90710 Acquired absence of both cervix and uterus: Secondary | ICD-10-CM | POA: Insufficient documentation

## 2012-04-16 LAB — CBC WITH DIFFERENTIAL/PLATELET
Basophils Absolute: 0.1 10*3/uL (ref 0.0–0.1)
Eosinophils Absolute: 0.2 10*3/uL (ref 0.0–0.5)
HGB: 13.7 g/dL (ref 11.6–15.9)
LYMPH%: 30.7 % (ref 14.0–49.7)
MCV: 89.4 fL (ref 79.5–101.0)
MONO%: 6.7 % (ref 0.0–14.0)
NEUT#: 5.2 10*3/uL (ref 1.5–6.5)
NEUT%: 59.1 % (ref 38.4–76.8)
Platelets: 288 10*3/uL (ref 145–400)

## 2012-04-16 LAB — COMPREHENSIVE METABOLIC PANEL (CC13)
Albumin: 3.7 g/dL (ref 3.5–5.0)
Alkaline Phosphatase: 64 U/L (ref 40–150)
BUN: 19.6 mg/dL (ref 7.0–26.0)
Glucose: 115 mg/dl — ABNORMAL HIGH (ref 70–99)
Total Bilirubin: 0.46 mg/dL (ref 0.20–1.20)

## 2012-04-16 MED ORDER — IOHEXOL 300 MG/ML  SOLN
100.0000 mL | Freq: Once | INTRAMUSCULAR | Status: AC | PRN
  Administered 2012-04-16: 100 mL via INTRAVENOUS

## 2012-04-18 ENCOUNTER — Ambulatory Visit (HOSPITAL_BASED_OUTPATIENT_CLINIC_OR_DEPARTMENT_OTHER): Payer: Medicare Other | Admitting: Oncology

## 2012-04-18 ENCOUNTER — Telehealth: Payer: Self-pay | Admitting: Oncology

## 2012-04-18 VITALS — BP 141/80 | HR 86 | Temp 97.1°F | Resp 22 | Ht 64.0 in | Wt 222.9 lb

## 2012-04-18 DIAGNOSIS — R911 Solitary pulmonary nodule: Secondary | ICD-10-CM

## 2012-04-18 DIAGNOSIS — C649 Malignant neoplasm of unspecified kidney, except renal pelvis: Secondary | ICD-10-CM

## 2012-04-18 NOTE — Telephone Encounter (Signed)
Gave pt appt for MD visit , fax referral to  [facility] and called them , they will call me back regarding appt

## 2012-04-18 NOTE — Progress Notes (Signed)
Hematology and Oncology Follow Up Visit  KATIE FARAONE [id number] [birth year] 67 y.o. 04/18/2012 10:50 AM  CC: Ines Bloomer, M.D.  Anna Genre Little, M.D.    Principle Diagnosis: This is a 67 year old female diagnosed with renal cell carcinoma, diagnosed in 2001, had a T2 disease and had recurrent disease in a lung nodule in 2000.  Prior Therapy: She is status post left nephrectomy in 2001. She developed a left lingular mass, status post VATS procedure.  Pathology revealed a recurrent renal cell carcinoma resected in October 2008.  Has been really clear of disease since that time.  Current therapy: Observation and surveillance.  Interim History: Mrs. Zegers presents today for a followup visit.  She has been doing relatively well.  She had not reported any neurological symptoms.  Had not reported any peripheral neuropathy, not about any loss of bowel or bladder function.She did not report any constitutional symptoms, did not report any weight loss, did not report any difficulty breathing.  She continued to perform more activities of daily living without any major hindrance or decline. She report an episode of gout that has resolved now. She reports some mild DOE and mild cough.   Medications: I have reviewed the patient's current medications. Current outpatient prescriptions:diphenhydramine-acetaminophen (TYLENOL PM) 25-500 MG TABS, Take 1 tablet by mouth at bedtime as needed. For insomnia, Disp: , Rfl: ;  doxycycline (VIBRAMYCIN) 100 MG capsule, Take 1 capsule (100 mg total) by mouth 2 (two) times daily., Disp: 20 capsule, Rfl: 0;  ergocalciferol (VITAMIN D2) 50000 UNITS capsule, Take 50,000 Units by mouth once a week., Disp: , Rfl:  fish oil-omega-3 fatty acids 1000 MG capsule, Take 2 g by mouth daily., Disp: , Rfl: ;  HYDROcodone-acetaminophen (NORCO/VICODIN) 5-325 MG per tablet, Take one by mouth at bedtime as needed for pain, Disp: 10 tablet, Rfl: 0  Allergies: No Known Allergies  Past  Medical History, Surgical history, Social history, and Family History were reviewed and updated.  Review of Systems: Constitutional:  Negative for fever, chills, night sweats, anorexia, weight loss, pain. Cardiovascular: no chest pain or dyspnea on exertion Respiratory: negative Neurological: negative Dermatological: negative ENT: negative Skin: Negative. Gastrointestinal: negative Genito-Urinary: negative Hematological and Lymphatic: negative Breast: negative Musculoskeletal: negative Remaining ROS negative. Physical Exam: Blood pressure 141/80, pulse 86, temperature 97.1 F (36.2 C), temperature source Oral, resp. rate 22, height 5\' 4"  (1.626 m), weight 222 lb 14.4 oz (101.107 kg). ECOG: 1 General appearance: alert Head: Normocephalic, without obvious abnormality, atraumatic Neck: no adenopathy, no carotid bruit, no JVD, supple, symmetrical, trachea midline and thyroid not enlarged, symmetric, no tenderness/mass/nodules Lymph nodes: Cervical, supraclavicular, and axillary nodes normal. Heart:regular rate and rhythm, S1, S2 normal, no murmur, click, rub or gallop Lung:chest clear, no wheezing, rales, normal symmetric air entry Abdomin: soft, non-tender, without masses or organomegaly EXT:no erythema, induration, or nodules   Lab Results: Lab Results  Component Value Date   WBC 8.7 04/16/2012   HGB 13.7 04/16/2012   HCT 41.0 04/16/2012   MCV 89.4 04/16/2012   PLT 288 04/16/2012     Chemistry      Component Value Date/Time   NA 140 04/16/2012 0923   NA 138 05/16/2011 1134   NA 137 07/05/2010 1117   K 4.4 04/16/2012 0923   K 4.8 05/16/2011 1134   K 4.9* 07/05/2010 1117   CL 107 04/16/2012 0923   CL 105 05/16/2011 1134   CL 96* 07/05/2010 1117   CO2 21* 04/16/2012 0923   CO2  24 05/16/2011 1134   CO2 29 07/05/2010 1117   BUN 19.6 04/16/2012 0923   BUN 18 05/16/2011 1134   BUN 25* 07/05/2010 1117   CREATININE 1.0 04/16/2012 0923   CREATININE 1.04 05/16/2011 1134   CREATININE 1.4* 07/05/2010  1117      Component Value Date/Time   CALCIUM 9.9 04/16/2012 0923   CALCIUM 10.1 05/16/2011 1134   CALCIUM 9.9 07/05/2010 1117   ALKPHOS 64 04/16/2012 0923   ALKPHOS 59 05/16/2011 1134   ALKPHOS 65 07/05/2010 1117   AST 35* 04/16/2012 0923   AST 42* 05/16/2011 1134   AST 34 07/05/2010 1117   ALT 49 04/16/2012 0923   ALT 48* 05/16/2011 1134   BILITOT 0.46 04/16/2012 0923   BILITOT 0.4 05/16/2011 1134   BILITOT 1.00 07/05/2010 1117      CT CHEST, ABDOMEN AND PELVIS WITH CONTRAST  Technique: Multidetector CT imaging of the chest, abdomen and  pelvis was performed following the standard protocol during bolus  administration of intravenous contrast.  Contrast: OMNIPAQUE IOHEXOL 300 MG/ML SOLN  Comparison: CT of the chest abdomen and pelvis 11/10/2011.  CT CHEST  Findings:  Mediastinum: Heart size is borderline enlarged. There is no  significant pericardial fluid, thickening or pericardial  calcification. No pathologically enlarged mediastinal or hilar  lymph nodes. Atherosclerosis of the thoracic aorta, without  evidence of aneurysm or dissection. Esophagus is unremarkable in  appearance.  Lungs/Pleura: Significant interval enlargement of a macrolobulated  nodule in the left upper lobe (image 21 of series 5) which  currently measures 15 x 16 mm. This nodule is immediately adjacent  to a suture line at site of prior wedge resection. No other  definite new pulmonary nodules are noted. There is a pattern of  fine subpleural reticulation and some mild ground-glass attenuation  in the periphery of the lungs bilaterally, particularly in the lung  bases. No acute consolidative airspace disease. No pleural  effusions.  Musculoskeletal: There are no aggressive appearing lytic or blastic  lesions noted in the visualized portions of the skeleton.  IMPRESSION:  1. Progression of disease with enlargement of a solitary  metastasis in the left upper lobe which currently measures 1.4 x  1.6 cm. No new  metastatic lesions are otherwise noted.  2. Pattern of subtle peripheral ground-glass attenuation and  subpleural reticulation, most pronounced in the lung bases  bilaterally. This is similar to prior examinations, and could  represent early changes of interstitial lung disease, however, this  is nonspecific. Continued attention on future follow up studies is  recommended.  3. Mild atherosclerosis.  CT ABDOMEN AND PELVIS  Findings:  Abdomen/Pelvis: Status post left nephrectomy. No abnormal soft  tissue mass is noted in the left retroperitoneum to suggest local  recurrence of disease. No pathologic lymphadenopathy identified  within the abdomen or pelvis on today's CT examination. Decreased  attenuation throughout the hepatic parenchyma, suggestive of  hepatic steatosis (difficult to assess for certain on a contrast  enhanced CT examination). No focal hepatic lesions. Status post  cholecystectomy. The appearance of the pancreas, spleen, bilateral  adrenal glands and the right kidney is unremarkable.  There is no significant volume of ascites, no pneumoperitoneum and  no pathologic distension of small bowel. Atherosclerosis is noted  throughout the abdominal and pelvic vasculature, without definite  aneurysm or dissection. Numerous colonic diverticula are noted,  without surrounding inflammatory changes to suggest an acute  diverticulitis at this time. Status post hysterectomy. Ovaries  are not confidently identified  and may either be atrophic or  surgically absent.  Musculoskeletal: There are no aggressive appearing lytic or blastic  lesions noted in the visualized portions of the skeleton.  IMPRESSION:  1. Status post left nephrectomy without evidence to suggest local  recurrence of disease or new metastatic disease in the abdomen or  pelvis.  2. Colonic diverticulosis without evidence to suggest acute  diverticulitis at this time.  3. Probable mild hepatic steatosis.  4. Status  post cholecystectomy and hysterectomy. The ovaries may  also be surgically absent (or are atrophic).  5. Atherosclerosis.    Impression and Plan:   This is a pleasant 67 year-old female with the following issues: 1. Renal cell carcinoma.  She had stage IV disease with really no evidence of any detectable disease up to this point.  CT scans of the chest, abdomin and pelvis reviewed with the patient today. She had a lung nodule that was resected in 2008 years ago and another small  15 X 16 mm nodule that has grown now since last exam.  The plan is send her for evaluation and consideration for surgical resection at that time. The alternative would be possibly consider radiation if surgery is not an option.  2.     Back pain: resolved.    [facility], MD 3/20/201410:50 AM

## 2012-04-22 ENCOUNTER — Encounter: Payer: Medicare Other | Admitting: Surgery

## 2012-04-23 ENCOUNTER — Institutional Professional Consult (permissible substitution) (INDEPENDENT_AMBULATORY_CARE_PROVIDER_SITE_OTHER): Payer: Medicare Other | Admitting: Surgery

## 2012-04-23 ENCOUNTER — Encounter: Payer: Self-pay | Admitting: Surgery

## 2012-04-23 ENCOUNTER — Other Ambulatory Visit: Payer: Self-pay

## 2012-04-23 VITALS — BP 139/84 | HR 93 | Resp 18 | Ht 64.0 in | Wt 220.0 lb

## 2012-04-23 DIAGNOSIS — D381 Neoplasm of uncertain behavior of trachea, bronchus and lung: Secondary | ICD-10-CM

## 2012-04-23 DIAGNOSIS — R222 Localized swelling, mass and lump, trunk: Secondary | ICD-10-CM

## 2012-04-23 DIAGNOSIS — R918 Other nonspecific abnormal finding of lung field: Secondary | ICD-10-CM

## 2012-04-23 NOTE — Progress Notes (Signed)
  [address]       [place] [address]             [phone]      PCP is LITTLE,KEVIN LORNE, MD  Referring Provider is Shadad, Firas N, MD  Chief Complaint   Patient presents with   .  Lung Mass     eval and treat   HPI:  The patient is a 67-year-old previous smoker who underwent a left radical nephrectomy for T2, N0, M0 renal cell cancer in 2001. She was subsequently found to have a nodule in the lingula and underwent a large wedge resection of this lesion by Dr. Burney in 2008. The surgical margins were negative but there was lymphovascular invasion present. Lymph node biopsies at 10 L and 11 L were negative for tumor. She has been followed over the years for a small nodule in the left upper lobe. She was last seen by Dr. Burney in February of 2010. She had followup CT scans about one year ago as well as in October 2013 and compared to her recent CT scan showed progressive enlargement of this left upper lobe lung nodule.  Past Medical History   Diagnosis  Date   .  Renal cell cancer      renal cell ca dx 9/01 and 8/08;   .  Lung cancer     Past Surgical History   Procedure  Laterality  Date   .  Abdominal hysterectomy     .  Lung cancer surgery     .  Renal mass excision      Family History   Problem  Relation  Age of Onset   .  Heart failure  Father    Social History  History   Substance Use Topics   .  Smoking status:  Former Smoker   .  Smokeless tobacco:  Not on file   .  Alcohol Use:  No    Current Outpatient Prescriptions   Medication  Sig  Dispense  Refill   .  diphenhydramine-acetaminophen (TYLENOL PM) 25-500 MG TABS  Take 1 tablet by mouth at bedtime as needed. For insomnia     .  doxycycline (VIBRAMYCIN) 100 MG capsule  Take 1 capsule (100 mg total) by mouth 2 (two) times daily.  20 capsule  0   .  ergocalciferol (VITAMIN D2) 50000 UNITS capsule  Take 50,000 Units by mouth once a week.     .  fish oil-omega-3 fatty acids 1000 MG capsule   Take 2 g by mouth daily.     .  HYDROcodone-acetaminophen (NORCO/VICODIN) 5-325 MG per tablet  Take one by mouth at bedtime as needed for pain  10 tablet  0    No current facility-administered medications for this visit.   No Known Allergies  Review of Systems  Constitutional: Positive for fatigue and unexpected weight change.  Weight gain  Eyes: Negative.  Respiratory: Positive for cough and shortness of breath.  Occasional streaky hemoptysis.  Cardiovascular: Negative.  Gastrointestinal: Negative.  Endocrine: Negative.  Genitourinary: Negative.  Musculoskeletal: Positive for arthralgias.  Skin: Negative.  Allergic/Immunologic: Negative.  Neurological: Positive for headaches.  Occasional for years.  Hematological: Negative.  Psychiatric/Behavioral: Negative.  BP 139/84  Pulse 93  Resp 18  Ht 5' 4" (1.626 m)  Wt 220 lb (99.791 kg)  BMI 37.74 kg/m2  SpO2 95%  Physical Exam  Constitutional: She is oriented to person, place, and time. She appears well-developed and   well-nourished. No distress.  HENT:  Head: Normocephalic and atraumatic.  Mouth/Throat: Oropharynx is clear and moist.  Eyes: Conjunctivae and EOM are normal. Pupils are equal, round, and reactive to light.  Neck: Normal range of motion. Neck supple. No JVD present. No tracheal deviation present. No thyromegaly present.  Cardiovascular: Normal rate, regular rhythm, normal heart sounds and intact distal pulses. Exam reveals no gallop and no friction rub.  No murmur heard.  Pulmonary/Chest: Effort normal and breath sounds normal. No respiratory distress. She has no wheezes. She has no rales.  Left thoracotomy scar without abnormality  Abdominal: Soft. Bowel sounds are normal. She exhibits no distension and no mass. There is no tenderness.  Musculoskeletal: She exhibits no edema.  Lymphadenopathy:  She has no cervical adenopathy.  Neurological: She is alert and oriented to person, place, and time. She has normal  strength. No cranial nerve deficit or sensory deficit.  Skin: Skin is warm and dry.  Psychiatric: She has a normal mood and affect.  Diagnostic Tests:  *RADIOLOGY REPORT*  Clinical Data: Kidney cancer status post left nephrectomy. Low  back pain.  CT CHEST, ABDOMEN AND PELVIS WITH CONTRAST 04/16/2012  Technique: Multidetector CT imaging of the chest, abdomen and  pelvis was performed following the standard protocol during bolus  administration of intravenous contrast.  Contrast: 100mL OMNIPAQUE IOHEXOL 300 MG/ML SOLN  Comparison: CT of the chest abdomen and pelvis 11/10/2011.  CT CHEST  Findings:  Mediastinum: Heart size is borderline enlarged. There is no  significant pericardial fluid, thickening or pericardial  calcification. No pathologically enlarged mediastinal or hilar  lymph nodes. Atherosclerosis of the thoracic aorta, without  evidence of aneurysm or dissection. Esophagus is unremarkable in  appearance.  Lungs/Pleura: Significant interval enlargement of a macrolobulated  nodule in the left upper lobe (image 21 of series 5) which  currently measures 15 x 16 mm. This nodule is immediately adjacent  to a suture line at site of prior wedge resection. No other  definite new pulmonary nodules are noted. There is a pattern of  fine subpleural reticulation and some mild ground-glass attenuation  in the periphery of the lungs bilaterally, particularly in the lung  bases. No acute consolidative airspace disease. No pleural  effusions.  Musculoskeletal: There are no aggressive appearing lytic or blastic  lesions noted in the visualized portions of the skeleton.  IMPRESSION:  1. Progression of disease with enlargement of a solitary  metastasis in the left upper lobe which currently measures 1.4 x  1.6 cm. No new metastatic lesions are otherwise noted.  2. Pattern of subtle peripheral ground-glass attenuation and  subpleural reticulation, most pronounced in the lung bases    bilaterally. This is similar to prior examinations, and could  represent early changes of interstitial lung disease, however, this  is nonspecific. Continued attention on future follow up studies is  recommended.  3. Mild atherosclerosis.  CT ABDOMEN AND PELVIS  Findings:  Abdomen/Pelvis: Status post left nephrectomy. No abnormal soft  tissue mass is noted in the left retroperitoneum to suggest local  recurrence of disease. No pathologic lymphadenopathy identified  within the abdomen or pelvis on today's CT examination. Decreased  attenuation throughout the hepatic parenchyma, suggestive of  hepatic steatosis (difficult to assess for certain on a contrast  enhanced CT examination). No focal hepatic lesions. Status post  cholecystectomy. The appearance of the pancreas, spleen, bilateral  adrenal glands and the right kidney is unremarkable.  There is no significant volume of ascites, no pneumoperitoneum and    no pathologic distension of small bowel. Atherosclerosis is noted  throughout the abdominal and pelvic vasculature, without definite  aneurysm or dissection. Numerous colonic diverticula are noted,  without surrounding inflammatory changes to suggest an acute  diverticulitis at this time. Status post hysterectomy. Ovaries  are not confidently identified and may either be atrophic or  surgically absent.  Musculoskeletal: There are no aggressive appearing lytic or blastic  lesions noted in the visualized portions of the skeleton.  IMPRESSION:  1. Status post left nephrectomy without evidence to suggest local  recurrence of disease or new metastatic disease in the abdomen or  pelvis.  2. Colonic diverticulosis without evidence to suggest acute  diverticulitis at this time.  3. Probable mild hepatic steatosis.  4. Status post cholecystectomy and hysterectomy. The ovaries may  also be surgically absent (or are atrophic).  5. Atherosclerosis.  Original Report Authenticated By:  Daniel Entrikin, M.D.  Impression:  She has an enlarging lesion in the left upper lobe which is likely an isolated metastasis from her previous renal cell carcinoma. I reviewed all of her previous CT scans and a PET scan back to 2008. I did not see this current lesion on her preoperative PET scan from 2008 but her followup CT scan on 01/30/2007 after surgery did show a small lesion in this location in the left upper lobe that has been present on serial scans ever since. It has slowly enlarged over the years. I suspect this was probably present at the time of her original surgery but had become evident. At the present time there is no evidence of any other recurrent disease. I think the best treatment is surgical resection if possible. The current lesion is in the more central part of the residual left upper lobe and would likely require completion left upper lobectomy to remove it. This would be more difficult since she has had previous surgery and is obese.  She will require pulmonary function testing to determine if she would tolerate a completion left upper lobectomy. I don't think there is any benefit to repeating her PET scan since her previous renal cell lesion had very low uptake and there are no other areas of concern on her current CAT scan. I discussed the operative procedure of flexible fiberoptic bronchoscopy followed by left thoracotomy and wedge resection or completion left upper lobectomy with the patient and her husband. I discussed the alternative of radiation therapy, benefits and risks of surgery including but not limited to bleeding, blood transfusion, infection, respiratory failure, prolonged air leak, bronchial stump complications, and recurrent cancer. She understands all this and like to proceed with surgery if her pulmonary function tests are adequate.  Plan:  We will obtain pulmonary function testing was diffusion capacity. I will notify her of the results and if they're acceptable we  will schedule surgery as noted above.  

## 2012-04-25 ENCOUNTER — Ambulatory Visit (HOSPITAL_COMMUNITY)
Admission: RE | Admit: 2012-04-25 | Discharge: 2012-04-25 | Disposition: A | Discharge status: Home / Self Care | Payer: Medicare Other | Source: Ambulatory Visit | Attending: Surgery | Admitting: Surgery

## 2012-04-25 ENCOUNTER — Other Ambulatory Visit: Payer: Self-pay | Admitting: *Deleted

## 2012-04-25 DIAGNOSIS — R918 Other nonspecific abnormal finding of lung field: Secondary | ICD-10-CM

## 2012-04-25 DIAGNOSIS — D381 Neoplasm of uncertain behavior of trachea, bronchus and lung: Secondary | ICD-10-CM | POA: Insufficient documentation

## 2012-04-25 LAB — PULMONARY FUNCTION TEST

## 2012-04-25 MED ORDER — ALBUTEROL SULFATE (5 MG/ML) 0.5% IN NEBU
2.5000 mg | INHALATION_SOLUTION | Freq: Once | RESPIRATORY_TRACT | Status: AC
  Administered 2012-04-25: 2.5 mg via RESPIRATORY_TRACT

## 2012-04-26 ENCOUNTER — Encounter (HOSPITAL_COMMUNITY): Payer: Self-pay | Admitting: Pharmacy Technician

## 2012-05-07 ENCOUNTER — Encounter (HOSPITAL_COMMUNITY)
Admission: RE | Admit: 2012-05-07 | Discharge: 2012-05-07 | Disposition: A | Discharge status: Home / Self Care | Payer: Medicare Other | Source: Ambulatory Visit | Attending: Surgery | Admitting: Surgery

## 2012-05-07 ENCOUNTER — Encounter (HOSPITAL_COMMUNITY): Payer: Self-pay

## 2012-05-07 ENCOUNTER — Ambulatory Visit (HOSPITAL_COMMUNITY)
Admission: RE | Admit: 2012-05-07 | Discharge: 2012-05-07 | Disposition: A | Discharge status: Home / Self Care | Payer: Medicare Other | Source: Ambulatory Visit | Attending: Surgery | Admitting: Surgery

## 2012-05-07 VITALS — BP 150/84 | HR 78 | Temp 98.5°F | Resp 20 | Ht 64.0 in | Wt 222.4 lb

## 2012-05-07 DIAGNOSIS — Z85118 Personal history of other malignant neoplasm of bronchus and lung: Secondary | ICD-10-CM | POA: Insufficient documentation

## 2012-05-07 DIAGNOSIS — M129 Arthropathy, unspecified: Secondary | ICD-10-CM | POA: Insufficient documentation

## 2012-05-07 DIAGNOSIS — Z01812 Encounter for preprocedural laboratory examination: Secondary | ICD-10-CM | POA: Insufficient documentation

## 2012-05-07 DIAGNOSIS — Z87891 Personal history of nicotine dependence: Secondary | ICD-10-CM | POA: Insufficient documentation

## 2012-05-07 DIAGNOSIS — K219 Gastro-esophageal reflux disease without esophagitis: Secondary | ICD-10-CM | POA: Insufficient documentation

## 2012-05-07 DIAGNOSIS — Z01818 Encounter for other preprocedural examination: Secondary | ICD-10-CM | POA: Insufficient documentation

## 2012-05-07 DIAGNOSIS — Z01810 Encounter for preprocedural cardiovascular examination: Secondary | ICD-10-CM | POA: Insufficient documentation

## 2012-05-07 DIAGNOSIS — Z85528 Personal history of other malignant neoplasm of kidney: Secondary | ICD-10-CM | POA: Insufficient documentation

## 2012-05-07 DIAGNOSIS — Z905 Acquired absence of kidney: Secondary | ICD-10-CM | POA: Insufficient documentation

## 2012-05-07 DIAGNOSIS — R918 Other nonspecific abnormal finding of lung field: Secondary | ICD-10-CM

## 2012-05-07 DIAGNOSIS — R911 Solitary pulmonary nodule: Secondary | ICD-10-CM | POA: Insufficient documentation

## 2012-05-07 HISTORY — DX: Unspecified osteoarthritis, unspecified site: M19.90

## 2012-05-07 HISTORY — DX: Pneumonia, unspecified organism: J18.9

## 2012-05-07 HISTORY — DX: Gastro-esophageal reflux disease without esophagitis: K21.9

## 2012-05-07 HISTORY — DX: Headache: R51

## 2012-05-07 HISTORY — DX: Shortness of breath: R06.02

## 2012-05-07 LAB — CBC
Hemoglobin: 14.4 g/dL (ref 12.0–15.0)
MCH: 30.5 pg (ref 26.0–34.0)
MCHC: 34.8 g/dL (ref 30.0–36.0)
MCV: 87.7 fL (ref 78.0–100.0)
RBC: 4.72 MIL/uL (ref 3.87–5.11)

## 2012-05-07 LAB — URINALYSIS, ROUTINE W REFLEX MICROSCOPIC
Bilirubin Urine: NEGATIVE
Glucose, UA: NEGATIVE mg/dL
Hgb urine dipstick: NEGATIVE
Ketones, ur: NEGATIVE mg/dL
Protein, ur: NEGATIVE mg/dL
Urobilinogen, UA: 0.2 mg/dL (ref 0.0–1.0)

## 2012-05-07 LAB — BLOOD GAS, ARTERIAL
Acid-base deficit: 1.9 mmol/L (ref 0.0–2.0)
Bicarbonate: 22.1 mEq/L (ref 20.0–24.0)
Drawn by: 206361
O2 Saturation: 96.2 %
Patient temperature: 98.6

## 2012-05-07 LAB — PROTIME-INR
INR: 0.99 (ref 0.00–1.49)
Prothrombin Time: 13 seconds (ref 11.6–15.2)

## 2012-05-07 LAB — COMPREHENSIVE METABOLIC PANEL
ALT: 52 U/L — ABNORMAL HIGH (ref 0–35)
CO2: 20 mEq/L (ref 19–32)
Calcium: 10 mg/dL (ref 8.4–10.5)
Creatinine, Ser: 0.89 mg/dL (ref 0.50–1.10)
GFR calc Af Amer: 75 mL/min — ABNORMAL LOW (ref >90)
GFR calc non Af Amer: 65 mL/min — ABNORMAL LOW (ref >90)
Glucose, Bld: 100 mg/dL — ABNORMAL HIGH (ref 70–99)
Sodium: 138 mEq/L (ref 135–145)
Total Protein: 7.8 g/dL (ref 6.0–8.3)

## 2012-05-07 LAB — TYPE AND SCREEN
ABO/RH(D): A POS
Antibody Screen: NEGATIVE

## 2012-05-07 LAB — URINE MICROSCOPIC-ADD ON

## 2012-05-07 LAB — SURGICAL PCR SCREEN: MRSA, PCR: NEGATIVE

## 2012-05-07 LAB — APTT: aPTT: 31 seconds (ref 24–37)

## 2012-05-07 NOTE — Pre-Procedure Instructions (Addendum)
ARELENE MORONI  05/07/2012   Your procedure is scheduled on:  05/09/12  Report to [facility] at530 AM.  Call this number if you have problems the morning of surgery: [phone]   Remember:   Do not eat food or drink liquids after midnight.   Take these medicines the morning of surgery with A SIP OF WATER: colchicine  STOP  Fish oil, glucosamine now   Do not wear jewelry, make-up or nail polish.  Do not wear lotions, powders, or perfumes. You may wear deodorant.  Do not shave 48 hours prior to surgery. Men may shave face and neck.  Do not bring valuables to the hospital.  Contacts, dentures or bridgework may not be worn into surgery.  Leave suitcase in the car. After surgery it may be brought to your room.  For patients admitted to the hospital, checkout time is 11:00 AM the day of  discharge.   Patients discharged the day of surgery will not be allowed to drive  home.  Name and phone number of your driver:  Special Instructions: Shower using CHG 2 nights before surgery and the night before surgery.  If you shower the day of surgery use CHG.  Use special wash - you have one bottle of CHG for all showers.  You should use approximately 1/3 of the bottle for each shower.   Please read over the following fact sheets that you were given: Pain Booklet, Coughing and Deep Breathing, Blood Transfusion Information, MRSA Information and Surgical Site Infection Prevention

## 2012-05-08 MED ORDER — DEXTROSE 5 % IV SOLN
1.5000 g | INTRAVENOUS | Status: AC
  Administered 2012-05-09: 1.5 g via INTRAVENOUS
  Filled 2012-05-08: qty 1.5

## 2012-05-08 NOTE — Progress Notes (Signed)
Anesthesia chart review: Patient is a 67 year old female scheduled for left thoracotomy, wedge resection and bronchoscopy by Dr. Laneta Simmers on 05/09/2012. History includes former smoker, renal cell cancer s/p left radical nephrectomy '01 with metastatic disease to the lung (LUL) s/p wedge resection '08, pneumonia, GERD, arthritis, headaches.  PCP is Dr. Catha Gosselin.  EKG on 05/07/12 showed NSR, left BBB.  She has had a left BBB since at least 01/31/07 (See Muse).  Cardiac cath on 02/01/07 showed normal coronaries, normal LV function, EF 55%.  Echo on 01/30/07 showed: - Overall left ventricular systolic function was normal. Left ventricular ejection fraction was estimated to be 50 %. This study was inadequate for the evaluation of left ventricular regional wall motion. Left ventricular wall thickness was moderately increased. - Aortic valve thickness was mildly increased. There was lower normal aortic valve leaflet excursion. - Right ventricular size was at the upper limits of normal. - The inferior vena cava was mildly dilated.  CXR on 05/07/12 showed no active disease.  Nodular lesion in the LUL measuring 1.5 cm.  PFTs on 04/25/12 showed FEV1 1.80 (78%).  Preoperative labs noted.  Anticipate she can proceed as planned.  She will be evaluated by her assigned anesthesiologist on the day of surgery.  Velna Ochs [facility]/Anesthesiology Phone [phone] 05/08/2012 9:47 AM

## 2012-05-09 ENCOUNTER — Encounter (HOSPITAL_COMMUNITY): Payer: Self-pay | Admitting: Vascular Surgery

## 2012-05-09 ENCOUNTER — Inpatient Hospital Stay (HOSPITAL_COMMUNITY)
Admission: RE | Admit: 2012-05-09 | Discharge: 2012-05-16 | DRG: 164 | Disposition: A | Discharge status: Home / Self Care | Payer: Medicare Other | Source: Ambulatory Visit | Attending: Surgery | Admitting: Surgery

## 2012-05-09 ENCOUNTER — Inpatient Hospital Stay (HOSPITAL_COMMUNITY): Payer: Medicare Other

## 2012-05-09 ENCOUNTER — Encounter (HOSPITAL_COMMUNITY)
Admission: RE | Disposition: A | Discharge status: Home / Self Care | Payer: Self-pay | Source: Ambulatory Visit | Attending: Surgery

## 2012-05-09 ENCOUNTER — Ambulatory Visit (HOSPITAL_COMMUNITY): Payer: Medicare Other | Admitting: Anesthesiology

## 2012-05-09 ENCOUNTER — Encounter (HOSPITAL_COMMUNITY): Payer: Self-pay | Admitting: Surgery

## 2012-05-09 DIAGNOSIS — Z85118 Personal history of other malignant neoplasm of bronchus and lung: Secondary | ICD-10-CM

## 2012-05-09 DIAGNOSIS — J9819 Other pulmonary collapse: Secondary | ICD-10-CM | POA: Diagnosis not present

## 2012-05-09 DIAGNOSIS — J95812 Postprocedural air leak: Secondary | ICD-10-CM | POA: Diagnosis not present

## 2012-05-09 DIAGNOSIS — R918 Other nonspecific abnormal finding of lung field: Secondary | ICD-10-CM

## 2012-05-09 DIAGNOSIS — C7800 Secondary malignant neoplasm of unspecified lung: Principal | ICD-10-CM | POA: Diagnosis present

## 2012-05-09 DIAGNOSIS — C649 Malignant neoplasm of unspecified kidney, except renal pelvis: Secondary | ICD-10-CM | POA: Diagnosis present

## 2012-05-09 DIAGNOSIS — Y838 Other surgical procedures as the cause of abnormal reaction of the patient, or of later complication, without mention of misadventure at the time of the procedure: Secondary | ICD-10-CM | POA: Diagnosis not present

## 2012-05-09 DIAGNOSIS — Z85528 Personal history of other malignant neoplasm of kidney: Secondary | ICD-10-CM

## 2012-05-09 DIAGNOSIS — M129 Arthropathy, unspecified: Secondary | ICD-10-CM | POA: Diagnosis present

## 2012-05-09 DIAGNOSIS — Y9210 Unspecified residential institution as the place of occurrence of the external cause: Secondary | ICD-10-CM | POA: Diagnosis not present

## 2012-05-09 DIAGNOSIS — K5730 Diverticulosis of large intestine without perforation or abscess without bleeding: Secondary | ICD-10-CM | POA: Diagnosis present

## 2012-05-09 DIAGNOSIS — K219 Gastro-esophageal reflux disease without esophagitis: Secondary | ICD-10-CM | POA: Diagnosis present

## 2012-05-09 DIAGNOSIS — D381 Neoplasm of uncertain behavior of trachea, bronchus and lung: Secondary | ICD-10-CM

## 2012-05-09 DIAGNOSIS — Z87891 Personal history of nicotine dependence: Secondary | ICD-10-CM

## 2012-05-09 DIAGNOSIS — J90 Pleural effusion, not elsewhere classified: Secondary | ICD-10-CM | POA: Diagnosis not present

## 2012-05-09 HISTORY — PX: VIDEO BRONCHOSCOPY: SHX5072

## 2012-05-09 HISTORY — PX: WEDGE RESECTION: SHX5070

## 2012-05-09 HISTORY — PX: THORACOTOMY: SHX5074

## 2012-05-09 LAB — GLUCOSE, CAPILLARY
Glucose-Capillary: 116 mg/dL — ABNORMAL HIGH (ref 70–99)
Glucose-Capillary: 119 mg/dL — ABNORMAL HIGH (ref 70–99)

## 2012-05-09 SURGERY — BRONCHOSCOPY, VIDEO-ASSISTED
Anesthesia: General | Site: Chest | Wound class: Clean Contaminated

## 2012-05-09 MED ORDER — ONDANSETRON HCL 4 MG/2ML IJ SOLN
INTRAMUSCULAR | Status: DC | PRN
  Administered 2012-05-09: 4 mg via INTRAVENOUS

## 2012-05-09 MED ORDER — GLYCOPYRROLATE 0.2 MG/ML IJ SOLN
INTRAMUSCULAR | Status: DC | PRN
  Administered 2012-05-09: .8 mg via INTRAVENOUS

## 2012-05-09 MED ORDER — CEFUROXIME SODIUM 1.5 G IJ SOLR
1.5000 g | Freq: Two times a day (BID) | INTRAMUSCULAR | Status: AC
  Administered 2012-05-09 – 2012-05-10 (×2): 1.5 g via INTRAVENOUS
  Filled 2012-05-09 (×2): qty 1.5

## 2012-05-09 MED ORDER — OXYCODONE HCL 5 MG PO TABS: 5.0000 mg | ORAL_TABLET | Freq: Once | ORAL | Status: DC | PRN

## 2012-05-09 MED ORDER — LEVALBUTEROL HCL 0.63 MG/3ML IN NEBU
0.6300 mg | INHALATION_SOLUTION | Freq: Four times a day (QID) | RESPIRATORY_TRACT | Status: DC
  Administered 2012-05-09 – 2012-05-11 (×9): 0.63 mg via RESPIRATORY_TRACT
  Filled 2012-05-09 (×16): qty 3

## 2012-05-09 MED ORDER — POTASSIUM CHLORIDE 10 MEQ/50ML IV SOLN: 10.0000 meq | Freq: Every day | INTRAVENOUS | Status: DC | PRN

## 2012-05-09 MED ORDER — BISACODYL 5 MG PO TBEC
10.0000 mg | DELAYED_RELEASE_TABLET | Freq: Every day | ORAL | Status: DC
  Administered 2012-05-10 – 2012-05-14 (×5): 10 mg via ORAL
  Filled 2012-05-09 (×5): qty 2

## 2012-05-09 MED ORDER — SODIUM CHLORIDE 0.9 % IV SOLN
10.0000 mg | INTRAVENOUS | Status: DC | PRN
  Administered 2012-05-09: 15 ug/min via INTRAVENOUS

## 2012-05-09 MED ORDER — FENTANYL 10 MCG/ML IV SOLN
INTRAVENOUS | Status: DC
  Administered 2012-05-09: 40 ug via INTRAVENOUS
  Administered 2012-05-09: 20 ug via INTRAVENOUS
  Administered 2012-05-09: 10 ug via INTRAVENOUS
  Administered 2012-05-10: 10 ug/h via INTRAVENOUS
  Administered 2012-05-10: 30 ug via INTRAVENOUS
  Administered 2012-05-10: 20 ug via INTRAVENOUS
  Administered 2012-05-10 – 2012-05-11 (×3): 10 ug via INTRAVENOUS
  Administered 2012-05-11 (×2): 40 ug via INTRAVENOUS
  Administered 2012-05-12: 20 ug via INTRAVENOUS
  Administered 2012-05-12 – 2012-05-13 (×2): 10 ug via INTRAVENOUS
  Administered 2012-05-13: 40 ug via INTRAVENOUS
  Administered 2012-05-14: 10 ug via INTRAVENOUS
  Filled 2012-05-09: qty 50

## 2012-05-09 MED ORDER — MORPHINE SULFATE 10 MG/ML IJ SOLN
INTRAMUSCULAR | Status: DC | PRN
  Administered 2012-05-09 (×2): 5 mg via INTRAVENOUS

## 2012-05-09 MED ORDER — BUPIVACAINE 0.5 % ON-Q PUMP SINGLE CATH 400 ML
INJECTION | Status: DC | PRN
  Administered 2012-05-09: 400 mL

## 2012-05-09 MED ORDER — ROCURONIUM BROMIDE 100 MG/10ML IV SOLN
INTRAVENOUS | Status: DC | PRN
Start: 2012-05-09 — End: 2012-05-09
  Administered 2012-05-09 (×2): 50 mg via INTRAVENOUS

## 2012-05-09 MED ORDER — FENTANYL CITRATE 0.05 MG/ML IJ SOLN
INTRAMUSCULAR | Status: DC | PRN
  Administered 2012-05-09 (×2): 100 ug via INTRAVENOUS
  Administered 2012-05-09: 50 ug via INTRAVENOUS
  Administered 2012-05-09: 150 ug via INTRAVENOUS
  Administered 2012-05-09: 100 ug via INTRAVENOUS
  Administered 2012-05-09 (×2): 50 ug via INTRAVENOUS

## 2012-05-09 MED ORDER — OXYCODONE-ACETAMINOPHEN 5-325 MG PO TABS
1.0000 | ORAL_TABLET | ORAL | Status: DC | PRN
  Administered 2012-05-10 – 2012-05-11 (×2): 1 via ORAL
  Administered 2012-05-11: 2 via ORAL
  Administered 2012-05-12 (×2): 1 via ORAL
  Administered 2012-05-13 – 2012-05-14 (×3): 2 via ORAL
  Administered 2012-05-14: 1 via ORAL
  Administered 2012-05-15: 2 via ORAL
  Administered 2012-05-15: 1 via ORAL
  Administered 2012-05-15 – 2012-05-16 (×3): 2 via ORAL
  Administered 2012-05-16: 1 via ORAL
  Filled 2012-05-09: qty 2
  Filled 2012-05-09: qty 1
  Filled 2012-05-09 (×5): qty 2
  Filled 2012-05-09 (×6): qty 1
  Filled 2012-05-09 (×2): qty 2

## 2012-05-09 MED ORDER — DEXTROSE-NACL 5-0.9 % IV SOLN
INTRAVENOUS | Status: DC
  Administered 2012-05-09: 75 mL/h via INTRAVENOUS
  Administered 2012-05-10: 07:00:00 via INTRAVENOUS

## 2012-05-09 MED ORDER — NEOSTIGMINE METHYLSULFATE 1 MG/ML IJ SOLN
INTRAMUSCULAR | Status: DC | PRN
  Administered 2012-05-09: 5 mg via INTRAVENOUS

## 2012-05-09 MED ORDER — ACETAMINOPHEN 10 MG/ML IV SOLN
1000.0000 mg | Freq: Four times a day (QID) | INTRAVENOUS | Status: AC
  Administered 2012-05-09 – 2012-05-10 (×4): 1000 mg via INTRAVENOUS
  Filled 2012-05-09 (×5): qty 100

## 2012-05-09 MED ORDER — MEPERIDINE HCL 25 MG/ML IJ SOLN: 6.2500 mg | INTRAMUSCULAR | Status: DC | PRN

## 2012-05-09 MED ORDER — MIDAZOLAM HCL 2 MG/2ML IJ SOLN: 0.5000 mg | Freq: Once | INTRAMUSCULAR | Status: DC | PRN

## 2012-05-09 MED ORDER — DIPHENHYDRAMINE HCL 12.5 MG/5ML PO ELIX: 12.5000 mg | ORAL_SOLUTION | Freq: Four times a day (QID) | ORAL | Status: DC | PRN

## 2012-05-09 MED ORDER — PROPOFOL 10 MG/ML IV BOLUS
INTRAVENOUS | Status: DC | PRN
  Administered 2012-05-09: 10 mg via INTRAVENOUS
  Administered 2012-05-09: 50 mg via INTRAVENOUS
  Administered 2012-05-09: 150 mg via INTRAVENOUS
  Administered 2012-05-09: 5 mg via INTRAVENOUS

## 2012-05-09 MED ORDER — SENNOSIDES-DOCUSATE SODIUM 8.6-50 MG PO TABS
1.0000 | ORAL_TABLET | Freq: Every evening | ORAL | Status: DC | PRN
  Administered 2012-05-12: 1 via ORAL
  Filled 2012-05-09 (×2): qty 1

## 2012-05-09 MED ORDER — HYDROMORPHONE HCL PF 1 MG/ML IJ SOLN: 0.2500 mg | INTRAMUSCULAR | Status: DC | PRN

## 2012-05-09 MED ORDER — NALOXONE HCL 0.4 MG/ML IJ SOLN
0.4000 mg | INTRAMUSCULAR | Status: DC | PRN
  Filled 2012-05-09: qty 1

## 2012-05-09 MED ORDER — MIDAZOLAM HCL 5 MG/5ML IJ SOLN
INTRAMUSCULAR | Status: DC | PRN
  Administered 2012-05-09: 2 mg via INTRAVENOUS

## 2012-05-09 MED ORDER — DIPHENHYDRAMINE HCL 50 MG/ML IJ SOLN: 12.5000 mg | Freq: Four times a day (QID) | INTRAMUSCULAR | Status: DC | PRN

## 2012-05-09 MED ORDER — ONDANSETRON HCL 4 MG/2ML IJ SOLN: 4.0000 mg | Freq: Four times a day (QID) | INTRAMUSCULAR | Status: DC | PRN

## 2012-05-09 MED ORDER — PHENYLEPHRINE HCL 10 MG/ML IJ SOLN
INTRAMUSCULAR | Status: DC | PRN
  Administered 2012-05-09: 80 ug via INTRAVENOUS
  Administered 2012-05-09: 40 ug via INTRAVENOUS
  Administered 2012-05-09 (×2): 80 ug via INTRAVENOUS

## 2012-05-09 MED ORDER — SODIUM CHLORIDE 0.9 % IJ SOLN: 9.0000 mL | INTRAMUSCULAR | Status: DC | PRN

## 2012-05-09 MED ORDER — BUPIVACAINE 0.5 % ON-Q PUMP SINGLE CATH 400 ML
400.0000 mL | INJECTION | Status: DC
  Filled 2012-05-09: qty 400

## 2012-05-09 MED ORDER — OMEGA-3 FATTY ACIDS 1000 MG PO CAPS: 2.0000 g | ORAL_CAPSULE | Freq: Every day | ORAL | Status: DC

## 2012-05-09 MED ORDER — TRAMADOL HCL 50 MG PO TABS
50.0000 mg | ORAL_TABLET | Freq: Four times a day (QID) | ORAL | Status: DC | PRN
  Administered 2012-05-11: 100 mg via ORAL
  Filled 2012-05-09: qty 2

## 2012-05-09 MED ORDER — INSULIN ASPART 100 UNIT/ML ~~LOC~~ SOLN
0.0000 [IU] | SUBCUTANEOUS | Status: DC
  Administered 2012-05-10: 2 [IU] via SUBCUTANEOUS
  Administered 2012-05-10: 4 [IU] via SUBCUTANEOUS
  Administered 2012-05-10: 2 [IU] via SUBCUTANEOUS

## 2012-05-09 MED ORDER — OXYCODONE HCL 5 MG/5ML PO SOLN: 5.0000 mg | Freq: Once | ORAL | Status: DC | PRN

## 2012-05-09 MED ORDER — 0.9 % SODIUM CHLORIDE (POUR BTL) OPTIME
TOPICAL | Status: DC | PRN
  Administered 2012-05-09: 3000 mL

## 2012-05-09 MED ORDER — LACTATED RINGERS IV SOLN
INTRAVENOUS | Status: DC | PRN
  Administered 2012-05-09 (×2): via INTRAVENOUS

## 2012-05-09 MED ORDER — PROMETHAZINE HCL 25 MG/ML IJ SOLN: 6.2500 mg | INTRAMUSCULAR | Status: DC | PRN

## 2012-05-09 MED ORDER — OMEGA-3-ACID ETHYL ESTERS 1 G PO CAPS
2.0000 g | ORAL_CAPSULE | Freq: Every day | ORAL | Status: DC
  Administered 2012-05-10 – 2012-05-15 (×6): 2 g via ORAL
  Filled 2012-05-09 (×8): qty 2

## 2012-05-09 MED ORDER — HEMOSTATIC AGENTS (NO CHARGE) OPTIME
TOPICAL | Status: DC | PRN
  Administered 2012-05-09 (×2): 1 via TOPICAL

## 2012-05-09 MED ORDER — COLCHICINE 0.6 MG PO TABS
0.6000 mg | ORAL_TABLET | Freq: Every day | ORAL | Status: DC | PRN
  Filled 2012-05-09: qty 1

## 2012-05-09 MED ORDER — OXYCODONE HCL 5 MG PO TABS: 5.0000 mg | ORAL_TABLET | ORAL | Status: AC | PRN

## 2012-05-09 SURGICAL SUPPLY — 80 items
BALL CTTN LRG ABS STRL LF (GAUZE/BANDAGES/DRESSINGS)
BRUSH CYTOL CELLEBRITY 1.5X140 (MISCELLANEOUS) IMPLANT
CANISTER SUCTION 2500CC (MISCELLANEOUS) ×6 IMPLANT
CATH KIT ON Q 5IN SLV (PAIN MANAGEMENT) ×3 IMPLANT
CATH THORACIC 28FR (CATHETERS) IMPLANT
CATH THORACIC 36FR (CATHETERS) IMPLANT
CATH THORACIC 36FR RT ANG (CATHETERS) IMPLANT
CLOTH BEACON ORANGE TIMEOUT ST (SAFETY) ×3 IMPLANT
CONN ST 1/4X3/8  BEN (MISCELLANEOUS) ×2
CONN ST 1/4X3/8 BEN (MISCELLANEOUS) ×4 IMPLANT
CONN Y 3/8X3/8X3/8  BEN (MISCELLANEOUS) ×1
CONN Y 3/8X3/8X3/8 BEN (MISCELLANEOUS) ×2 IMPLANT
CONT SPEC 4OZ CLIKSEAL STRL BL (MISCELLANEOUS) ×6 IMPLANT
COTTONBALL LRG STERILE PKG (GAUZE/BANDAGES/DRESSINGS) IMPLANT
COVER SURGICAL LIGHT HANDLE (MISCELLANEOUS) ×6 IMPLANT
COVER TABLE BACK 60X90 (DRAPES) ×3 IMPLANT
DRAIN CHANNEL 28F RND 3/8 FF (WOUND CARE) IMPLANT
DRAIN CHANNEL 32F RND 10.7 FF (WOUND CARE) ×6 IMPLANT
DRAPE LAPAROSCOPIC ABDOMINAL (DRAPES) ×3 IMPLANT
DRAPE WARM FLUID 44X44 (DRAPE) ×3 IMPLANT
DRILL BIT 5/64 (BIT) IMPLANT
DRSG TEGADERM 4X4.75 (GAUZE/BANDAGES/DRESSINGS) ×3 IMPLANT
ELECT REM PT RETURN 9FT ADLT (ELECTROSURGICAL) ×3
ELECTRODE REM PT RTRN 9FT ADLT (ELECTROSURGICAL) ×2 IMPLANT
FORCEPS BIOP RJ4 1.8 (CUTTING FORCEPS) IMPLANT
GLOVE BIOGEL M STRL SZ7.5 (GLOVE) ×3 IMPLANT
GLOVE BIOGEL PI IND STRL 6.5 (GLOVE) ×6 IMPLANT
GLOVE BIOGEL PI IND STRL 7.0 (GLOVE) ×4 IMPLANT
GLOVE BIOGEL PI INDICATOR 6.5 (GLOVE) ×3
GLOVE BIOGEL PI INDICATOR 7.0 (GLOVE) ×2
GLOVE EUDERMIC 7 POWDERFREE (GLOVE) ×6 IMPLANT
GOWN PREVENTION PLUS XLARGE (GOWN DISPOSABLE) ×3 IMPLANT
GOWN STRL NON-REIN LRG LVL3 (GOWN DISPOSABLE) ×9 IMPLANT
HANDLE STAPLE ENDO GIA SHORT (STAPLE) ×1
HEMOSTAT SURGICEL 2X14 (HEMOSTASIS) ×3 IMPLANT
KIT BASIN OR (CUSTOM PROCEDURE TRAY) ×3 IMPLANT
KIT ROOM TURNOVER OR (KITS) ×3 IMPLANT
MARKER SKIN DUAL TIP RULER LAB (MISCELLANEOUS) ×3 IMPLANT
NEEDLE 22X1 1/2 (OR ONLY) (NEEDLE) IMPLANT
NEEDLE BIOPSY TRANSBRONCH 21G (NEEDLE) IMPLANT
NS IRRIG 1000ML POUR BTL (IV SOLUTION) ×9 IMPLANT
OIL SILICONE PENTAX (PARTS (SERVICE/REPAIRS)) ×3 IMPLANT
PACK CHEST (CUSTOM PROCEDURE TRAY) ×3 IMPLANT
PAD ARMBOARD 7.5X6 YLW CONV (MISCELLANEOUS) ×6 IMPLANT
RELOAD EGIA 60 MED/THCK PURPLE (STAPLE) ×9 IMPLANT
SEALANT PROGEL (MISCELLANEOUS) ×3 IMPLANT
SEALANT SURG COSEAL 4ML (VASCULAR PRODUCTS) IMPLANT
SEALANT SURG COSEAL 8ML (VASCULAR PRODUCTS) IMPLANT
SOLUTION ANTI FOG 6CC (MISCELLANEOUS) ×3 IMPLANT
SPONGE GAUZE 4X4 12PLY (GAUZE/BANDAGES/DRESSINGS) ×9 IMPLANT
STAPLER ENDO GIA 12MM SHORT (STAPLE) ×2 IMPLANT
SUT PROLENE 3 0 SH DA (SUTURE) IMPLANT
SUT SILK  1 MH (SUTURE) ×2
SUT SILK 1 MH (SUTURE) ×4 IMPLANT
SUT SILK 2 0SH CR/8 30 (SUTURE) ×3 IMPLANT
SUT SILK 3 0SH CR/8 30 (SUTURE) IMPLANT
SUT VIC AB 1 CTX 36 (SUTURE) ×4
SUT VIC AB 1 CTX36XBRD ANBCTR (SUTURE) ×4 IMPLANT
SUT VIC AB 2-0 CT1 27 (SUTURE)
SUT VIC AB 2-0 CT1 TAPERPNT 27 (SUTURE) IMPLANT
SUT VIC AB 2-0 CTX 36 (SUTURE) ×6 IMPLANT
SUT VIC AB 2-0 UR6 27 (SUTURE) IMPLANT
SUT VIC AB 3-0 MH 27 (SUTURE) IMPLANT
SUT VIC AB 3-0 SH 27 (SUTURE) ×3
SUT VIC AB 3-0 SH 27X BRD (SUTURE) ×2 IMPLANT
SUT VIC AB 3-0 X1 27 (SUTURE) ×3 IMPLANT
SUT VICRYL 2 TP 1 (SUTURE) ×6 IMPLANT
SYR 20ML ECCENTRIC (SYRINGE) ×3 IMPLANT
SYR 5ML LUER SLIP (SYRINGE) ×3 IMPLANT
SYR CONTROL 10ML LL (SYRINGE) IMPLANT
SYSTEM SAHARA CHEST DRAIN ATS (WOUND CARE) ×3 IMPLANT
TAPE CLOTH SURG 4X10 WHT LF (GAUZE/BANDAGES/DRESSINGS) ×6 IMPLANT
TIP APPLICATOR SPRAY EXTEND 16 (VASCULAR PRODUCTS) IMPLANT
TOWEL OR 17X24 6PK STRL BLUE (TOWEL DISPOSABLE) ×3 IMPLANT
TOWEL OR 17X26 10 PK STRL BLUE (TOWEL DISPOSABLE) ×3 IMPLANT
TRAP SPECIMEN MUCOUS 40CC (MISCELLANEOUS) ×3 IMPLANT
TRAY FOLEY CATH 14FRSI W/METER (CATHETERS) ×3 IMPLANT
TUBE CONNECTING 12X1/4 (SUCTIONS) ×3 IMPLANT
TUNNELER SHEATH ON-Q 11GX8 (MISCELLANEOUS) ×3 IMPLANT
WATER STERILE IRR 1000ML POUR (IV SOLUTION) ×6 IMPLANT

## 2012-05-09 NOTE — H&P (Signed)
[address]       [place] [address]             [phone]      PCP is Mickie Hillier, MD  Referring Provider is Benjiman Core, MD  Chief Complaint   Patient presents with   .  Lung Mass     eval and treat   HPI:  The patient is a 67 year old previous smoker who underwent a left radical nephrectomy for T2, N0, M0 renal cell cancer in 2001. She was subsequently found to have a nodule in the lingula and underwent a large wedge resection of this lesion by Dr. Edwyna Shell in 2008. The surgical margins were negative but there was lymphovascular invasion present. Lymph node biopsies at 10 L and 11 L were negative for tumor. She has been followed over the years for a small nodule in the left upper lobe. She was last seen by Dr. Edwyna Shell in February of 2010. She had followup CT scans about one year ago as well as in October 2013 and compared to her recent CT scan showed progressive enlargement of this left upper lobe lung nodule.  Past Medical History   Diagnosis  Date   .  Renal cell cancer      renal cell ca dx 9/01 and 8/08;   .  Lung cancer     Past Surgical History   Procedure  Laterality  Date   .  Abdominal hysterectomy     .  Lung cancer surgery     .  Renal mass excision      Family History   Problem  Relation  Age of Onset   .  Heart failure  Father    Social History  History   Substance Use Topics   .  Smoking status:  Former Games developer   .  Smokeless tobacco:  Not on file   .  Alcohol Use:  No    Current Outpatient Prescriptions   Medication  Sig  Dispense  Refill   .  diphenhydramine-acetaminophen (TYLENOL PM) 25-500 MG TABS  Take 1 tablet by mouth at bedtime as needed. For insomnia     .  doxycycline (VIBRAMYCIN) 100 MG capsule  Take 1 capsule (100 mg total) by mouth 2 (two) times daily.  20 capsule  0   .  ergocalciferol (VITAMIN D2) 50000 UNITS capsule  Take 50,000 Units by mouth once a week.     .  fish oil-omega-3 fatty acids 1000 MG capsule   Take 2 g by mouth daily.     Marland Kitchen  HYDROcodone-acetaminophen (NORCO/VICODIN) 5-325 MG per tablet  Take one by mouth at bedtime as needed for pain  10 tablet  0    No current facility-administered medications for this visit.   No Known Allergies  Review of Systems  Constitutional: Positive for fatigue and unexpected weight change.  Weight gain  Eyes: Negative.  Respiratory: Positive for cough and shortness of breath.  Occasional streaky hemoptysis.  Cardiovascular: Negative.  Gastrointestinal: Negative.  Endocrine: Negative.  Genitourinary: Negative.  Musculoskeletal: Positive for arthralgias.  Skin: Negative.  Allergic/Immunologic: Negative.  Neurological: Positive for headaches.  Occasional for years.  Hematological: Negative.  Psychiatric/Behavioral: Negative.  BP 139/84  Pulse 93  Resp 18  Ht 5\' 4"  (1.626 m)  Wt 220 lb (99.791 kg)  BMI 37.74 kg/m2  SpO2 95%  Physical Exam  Constitutional: She is oriented to person, place, and time. She appears well-developed and  well-nourished. No distress.  HENT:  Head: Normocephalic and atraumatic.  Mouth/Throat: Oropharynx is clear and moist.  Eyes: Conjunctivae and EOM are normal. Pupils are equal, round, and reactive to light.  Neck: Normal range of motion. Neck supple. No JVD present. No tracheal deviation present. No thyromegaly present.  Cardiovascular: Normal rate, regular rhythm, normal heart sounds and intact distal pulses. Exam reveals no gallop and no friction rub.  No murmur heard.  Pulmonary/Chest: Effort normal and breath sounds normal. No respiratory distress. She has no wheezes. She has no rales.  Left thoracotomy scar without abnormality  Abdominal: Soft. Bowel sounds are normal. She exhibits no distension and no mass. There is no tenderness.  Musculoskeletal: She exhibits no edema.  Lymphadenopathy:  She has no cervical adenopathy.  Neurological: She is alert and oriented to person, place, and time. She has normal  strength. No cranial nerve deficit or sensory deficit.  Skin: Skin is warm and dry.  Psychiatric: She has a normal mood and affect.  Diagnostic Tests:  *RADIOLOGY REPORT*  Clinical Data: Kidney cancer status post left nephrectomy. Low  back pain.  CT CHEST, ABDOMEN AND PELVIS WITH CONTRAST 04/16/2012  Technique: Multidetector CT imaging of the chest, abdomen and  pelvis was performed following the standard protocol during bolus  administration of intravenous contrast.  Contrast: OMNIPAQUE IOHEXOL 300 MG/ML SOLN  Comparison: CT of the chest abdomen and pelvis 11/10/2011.  CT CHEST  Findings:  Mediastinum: Heart size is borderline enlarged. There is no  significant pericardial fluid, thickening or pericardial  calcification. No pathologically enlarged mediastinal or hilar  lymph nodes. Atherosclerosis of the thoracic aorta, without  evidence of aneurysm or dissection. Esophagus is unremarkable in  appearance.  Lungs/Pleura: Significant interval enlargement of a macrolobulated  nodule in the left upper lobe (image 21 of series 5) which  currently measures 15 x 16 mm. This nodule is immediately adjacent  to a suture line at site of prior wedge resection. No other  definite new pulmonary nodules are noted. There is a pattern of  fine subpleural reticulation and some mild ground-glass attenuation  in the periphery of the lungs bilaterally, particularly in the lung  bases. No acute consolidative airspace disease. No pleural  effusions.  Musculoskeletal: There are no aggressive appearing lytic or blastic  lesions noted in the visualized portions of the skeleton.  IMPRESSION:  1. Progression of disease with enlargement of a solitary  metastasis in the left upper lobe which currently measures 1.4 x  1.6 cm. No new metastatic lesions are otherwise noted.  2. Pattern of subtle peripheral ground-glass attenuation and  subpleural reticulation, most pronounced in the lung bases    bilaterally. This is similar to prior examinations, and could  represent early changes of interstitial lung disease, however, this  is nonspecific. Continued attention on future follow up studies is  recommended.  3. Mild atherosclerosis.  CT ABDOMEN AND PELVIS  Findings:  Abdomen/Pelvis: Status post left nephrectomy. No abnormal soft  tissue mass is noted in the left retroperitoneum to suggest local  recurrence of disease. No pathologic lymphadenopathy identified  within the abdomen or pelvis on today's CT examination. Decreased  attenuation throughout the hepatic parenchyma, suggestive of  hepatic steatosis (difficult to assess for certain on a contrast  enhanced CT examination). No focal hepatic lesions. Status post  cholecystectomy. The appearance of the pancreas, spleen, bilateral  adrenal glands and the right kidney is unremarkable.  There is no significant volume of ascites, no pneumoperitoneum and  no pathologic distension of small bowel. Atherosclerosis is noted  throughout the abdominal and pelvic vasculature, without definite  aneurysm or dissection. Numerous colonic diverticula are noted,  without surrounding inflammatory changes to suggest an acute  diverticulitis at this time. Status post hysterectomy. Ovaries  are not confidently identified and may either be atrophic or  surgically absent.  Musculoskeletal: There are no aggressive appearing lytic or blastic  lesions noted in the visualized portions of the skeleton.  IMPRESSION:  1. Status post left nephrectomy without evidence to suggest local  recurrence of disease or new metastatic disease in the abdomen or  pelvis.  2. Colonic diverticulosis without evidence to suggest acute  diverticulitis at this time.  3. Probable mild hepatic steatosis.  4. Status post cholecystectomy and hysterectomy. The ovaries may  also be surgically absent (or are atrophic).  5. Atherosclerosis.  Original Report Authenticated By:  Trudie Reed, M.D.  Impression:  She has an enlarging lesion in the left upper lobe which is likely an isolated metastasis from her previous renal cell carcinoma. I reviewed all of her previous CT scans and a PET scan back to 2008. I did not see this current lesion on her preoperative PET scan from 2008 but her followup CT scan on 01/30/2007 after surgery did show a small lesion in this location in the left upper lobe that has been present on serial scans ever since. It has slowly enlarged over the years. I suspect this was probably present at the time of her original surgery but had become evident. At the present time there is no evidence of any other recurrent disease. I think the best treatment is surgical resection if possible. The current lesion is in the more central part of the residual left upper lobe and would likely require completion left upper lobectomy to remove it. This would be more difficult since she has had previous surgery and is obese.  She will require pulmonary function testing to determine if she would tolerate a completion left upper lobectomy. I don't think there is any benefit to repeating her PET scan since her previous renal cell lesion had very low uptake and there are no other areas of concern on her current CAT scan. I discussed the operative procedure of flexible fiberoptic bronchoscopy followed by left thoracotomy and wedge resection or completion left upper lobectomy with the patient and her husband. I discussed the alternative of radiation therapy, benefits and risks of surgery including but not limited to bleeding, blood transfusion, infection, respiratory failure, prolonged air leak, bronchial stump complications, and recurrent cancer. She understands all this and like to proceed with surgery if her pulmonary function tests are adequate.  Plan:  We will obtain pulmonary function testing was diffusion capacity. I will notify her of the results and if they're acceptable we  will schedule surgery as noted above.

## 2012-05-09 NOTE — Progress Notes (Signed)
Patient ID: Amanda Crawford, female   DOB: [birth year], 67 y.o.   MRN: [id number]  SICU Evening Rounds:  Hemodynamically stable. Pain under control Urine output good CT output low. Small air leak.

## 2012-05-09 NOTE — Brief Op Note (Signed)
05/09/2012  11:03 AM  PATIENT:  Amanda Crawford  67 y.o. female  PRE-OPERATIVE DIAGNOSIS:  left upper lobe lung mass  POST-OPERATIVE DIAGNOSIS:  Renal cell cancer metastasis to left upper lobe.  PROCEDURE:  Procedure(s): VIDEO BRONCHOSCOPY (N/A) THORACOTOMY MAJOR (Left) LEFT UPPER LOBE WEDGE RESECTION (Left)   Frozen section: metastatic clear cell carcinoma, negative margins.  SURGEON:  Surgeon(s) and Role:    * Alleen Borne, MD - Primary    * Alleen Borne, MD - Primary  PHYSICIAN ASSISTANT: Gershon Crane, PA-C  ASSISTANTS: none   ANESTHESIA:   general  EBL:  Total I/O In: 1800 [I.V.:1800] Out: 375 [Urine:325; Blood:50]  BLOOD ADMINISTERED:none  DRAINS: (2 32 f) Blake drain(s) in the left pleural space.   LOCAL MEDICATIONS USED:  NONE  SPECIMEN:  Source of Specimen:  wedge resection of left upper lobe.  DISPOSITION OF SPECIMEN:  PATHOLOGY  COUNTS:  YES   PLAN OF CARE: Admit to inpatient   PATIENT DISPOSITION:  PACU - hemodynamically stable.   Delay start of Pharmacological VTE agent (>24hrs) due to surgical blood loss or risk of bleeding: yes

## 2012-05-09 NOTE — Anesthesia Preprocedure Evaluation (Signed)
Anesthesia Evaluation  Patient identified by MRN, date of birth, ID band Patient awake    Reviewed: Allergy & Precautions, H&P , NPO status , Patient's Chart, lab work & pertinent test results  History of Anesthesia Complications Negative for: history of anesthetic complications  Airway Mallampati: II TM Distance: >3 FB Neck ROM: Full    Dental  (+) Teeth Intact and Dental Advisory Given   Pulmonary shortness of breath, COPDformer smoker (quit ''08),  breath sounds clear to auscultation  Pulmonary exam normal       Cardiovascular Rhythm:Regular Rate:Normal  '09 cath: normal coronaries, EF 55%   Neuro/Psych  Headaches,    GI/Hepatic Neg liver ROS, GERD-  Medicated and Controlled,  Endo/Other  Morbid obesity  Renal/GU Renal disease (renal cell ca)     Musculoskeletal   Abdominal (+) + obese,   Peds  Hematology   Anesthesia Other Findings   Reproductive/Obstetrics                           Anesthesia Physical Anesthesia Plan  ASA: III  Anesthesia Plan: General   Post-op Pain Management:    Induction: Intravenous  Airway Management Planned: Oral ETT and Double Lumen EBT  Additional Equipment: Arterial line and CVP  Intra-op Plan:   Post-operative Plan: Extubation in OR  Informed Consent: I have reviewed the patients History and Physical, chart, labs and discussed the procedure including the risks, benefits and alternatives for the proposed anesthesia with the patient or authorized representative who has indicated his/her understanding and acceptance.   Dental advisory given  Plan Discussed with: Surgeon and CRNA  Anesthesia Plan Comments: (Plan routine monitors, A line, CVP, GETA)        Anesthesia Quick Evaluation

## 2012-05-09 NOTE — Preoperative (Signed)
Beta Blockers   Reason not to administer Beta Blockers:Not Applicable 

## 2012-05-09 NOTE — Interval H&P Note (Signed)
History and Physical Interval Note:  05/09/2012 7:46 AM  Amanda Crawford  has presented today for surgery, with the diagnosis of left upper lobe lung mass  The various methods of treatment have been discussed with the patient and family. After consideration of risks, benefits and other options for treatment, the patient has consented to  Procedure(s): VIDEO BRONCHOSCOPY (Left) THORACOTOMY MAJOR (Left) WEDGE RESECTION (Left) as a surgical intervention .  The patient's history has been reviewed, patient examined, no change in status, stable for surgery.  I have reviewed the patient's chart and labs.  Questions were answered to the patient's satisfaction.  PFT's are adequate to tolerate lobectomy if needed.   Alleen Borne

## 2012-05-09 NOTE — Transfer of Care (Signed)
Immediate Anesthesia Transfer of Care Note  Patient: Amanda Crawford  Procedure(s) Performed: Procedure(s): VIDEO BRONCHOSCOPY (N/A) THORACOTOMY MAJOR (Left) LEFT UPPER LOBE WEDGE RESECTION (Left)  Patient Location: PACU  Anesthesia Type:General  Level of Consciousness: sedated  Airway & Oxygen Therapy: Patient Spontanous Breathing and Patient connected to face mask oxygen  Post-op Assessment: Report given to PACU RN, Post -op Vital signs reviewed and stable and Patient moving all extremities  Post vital signs: Reviewed and stable  Complications: No apparent anesthesia complications

## 2012-05-09 NOTE — Anesthesia Postprocedure Evaluation (Signed)
  Anesthesia Post-op Note  Patient: Amanda Crawford  Procedure(s) Performed: Procedure(s): VIDEO BRONCHOSCOPY (N/A) THORACOTOMY MAJOR (Left) LEFT UPPER LOBE WEDGE RESECTION (Left)  Patient Location: PACU  Anesthesia Type:General  Level of Consciousness: awake, alert , oriented and patient cooperative  Airway and Oxygen Therapy: Patient Spontanous Breathing and Patient connected to nasal cannula oxygen  Post-op Pain: mild  Post-op Assessment: Post-op Vital signs reviewed, Patient's Cardiovascular Status Stable, Respiratory Function Stable, Patent Airway, No signs of Nausea or vomiting and Pain level controlled  Post-op Vital Signs: Reviewed and stable  Complications: No apparent anesthesia complications

## 2012-05-09 NOTE — Anesthesia Procedure Notes (Addendum)
Procedure Name: Intubation Date/Time: 05/09/2012 8:07 AM Performed by: Sharlene Dory E Pre-anesthesia Checklist: Patient identified, Emergency Drugs available, Suction available, Patient being monitored and Timeout performed Patient Re-evaluated:Patient Re-evaluated prior to inductionOxygen Delivery Method: Circle system utilized Preoxygenation: Pre-oxygenation with 100% oxygen Intubation Type: IV induction Ventilation: Mask ventilation without difficulty Laryngoscope Size: Mac and 4 Grade View: Grade II Tube type: Oral Tube size: 8.5 mm Number of attempts: 1 Airway Equipment and Method: Stylet Placement Confirmation: ETT inserted through vocal cords under direct vision,  positive ETCO2 and breath sounds checked- equal and bilateral Secured at: 21 cm Tube secured with: Tape Dental Injury: Teeth and Oropharynx as per pre-operative assessment    Procedure Name: Intubation Date/Time: 05/09/2012 8:23 AM Performed by: Sharlene Dory E Pre-anesthesia Checklist: Patient identified, Emergency Drugs available, Suction available, Patient being monitored and Timeout performed Patient Re-evaluated:Patient Re-evaluated prior to inductionOxygen Delivery Method: Circle system utilized Preoxygenation: Pre-oxygenation with 100% oxygen Laryngoscope Size: Mac and 4 Grade View: Grade II Endobronchial tube: Left, Double lumen EBT and EBT position confirmed by fiberoptic bronchoscope and 37 Fr Number of attempts: 1 Airway Equipment and Method: Stylet Placement Confirmation: ETT inserted through vocal cords under direct vision,  positive ETCO2 and breath sounds checked- equal and bilateral Tube secured with: Tape Dental Injury: Teeth and Oropharynx as per pre-operative assessment

## 2012-05-10 ENCOUNTER — Encounter (HOSPITAL_COMMUNITY): Payer: Self-pay | Admitting: Surgery

## 2012-05-10 ENCOUNTER — Inpatient Hospital Stay (HOSPITAL_COMMUNITY): Payer: Medicare Other

## 2012-05-10 LAB — CBC
HCT: 39.4 % (ref 36.0–46.0)
RDW: 13 % (ref 11.5–15.5)
WBC: 13.2 10*3/uL — ABNORMAL HIGH (ref 4.0–10.5)

## 2012-05-10 LAB — POCT I-STAT 3, ART BLOOD GAS (G3+)
Acid-Base Excess: 1 mmol/L (ref 0.0–2.0)
Bicarbonate: 26.7 mEq/L — ABNORMAL HIGH (ref 20.0–24.0)
O2 Saturation: 95 %
Patient temperature: 98.2

## 2012-05-10 LAB — BASIC METABOLIC PANEL
BUN: 11 mg/dL (ref 6–23)
Chloride: 104 mEq/L (ref 96–112)
GFR calc Af Amer: 75 mL/min — ABNORMAL LOW (ref >90)
GFR calc non Af Amer: 65 mL/min — ABNORMAL LOW (ref >90)
Potassium: 4 mEq/L (ref 3.5–5.1)
Sodium: 137 mEq/L (ref 135–145)

## 2012-05-10 LAB — GLUCOSE, CAPILLARY
Glucose-Capillary: 115 mg/dL — ABNORMAL HIGH (ref 70–99)
Glucose-Capillary: 161 mg/dL — ABNORMAL HIGH (ref 70–99)
Glucose-Capillary: 112 mg/dL — ABNORMAL HIGH (ref 70–99)
Glucose-Capillary: 138 mg/dL — ABNORMAL HIGH (ref 70–99)

## 2012-05-10 NOTE — Progress Notes (Signed)
Nutrition Brief Note  Malnutrition Screening Tool result is inaccurate.  Please consult if nutrition needs are identified.  Katie Raeli Wiens, RD, LDN Pager #: [phone] After-Hours Pager #: [phone]  

## 2012-05-10 NOTE — Op Note (Signed)
NAMESHONITA, RINCK NO.:  [id number]  MEDICAL RECORD NO.:  [id number]  LOCATION:  2313                         FACILITY:  [facility]  PHYSICIAN:  Evelene Croon, M.D.     DATE OF BIRTH:  1945/04/16  DATE OF PROCEDURE:  05/09/2012 DATE OF DISCHARGE:                              OPERATIVE REPORT   PREOPERATIVE DIAGNOSIS:  Renal cell carcinoma, metastasis to the left upper lobe of the lung.  POSTOPERATIVE DIAGNOSIS:  Renal cell carcinoma, metastasis to the left upper lobe of the lung.  OPERATIVE PROCEDURE: 1. Flexible fiberoptic bronchoscopy. 2. Left thoracotomy with wedge resection of left upper lobe of the     lung.  ATTENDING SURGEON:  Evelene Croon, MD  ASSISTANT:  Gershon Crane, PA-C  ANESTHESIA:  General endotracheal.  CLINICAL HISTORY:  The patient is a 67 year old previous smoker, who underwent left radical nephrectomy for T2 N0 M0 renal cell cancer in 2001.  She was subsequently found to have a nodule in the lingula and underwent a large wedge resection by Dr. Edwyna Shell in 2008.  The surgical margins were negative but there was lymphovascular invasion present. Lymph node biopsies at 10L and 11L were negative for tumor.  She has not followed over the years for a small nodule in the left upper lobe.  She was last seen by Dr. Edwyna Shell in February of 2010.  She had followup CT scans about 1 year ago, as well as in October of 2013 and compared to her recent CT scan, has shown progressive enlargement of this left upper lobe lung nodule.  After review of the scans and examination of the patient, I felt that repeat left thoracotomy for wedge resection for completion left upper lobectomy was indicated to remove this solitary lung metastasis.  I discussed the operative procedure, flexible fiberoptic bronchoscopy followed by left thoracotomy and wedge resection or left upper lobectomy with the patient and her family.  We discussed alternatives, benefits, and risks  including, but not limited to bleeding, blood transfusion, infection, prolonged air leak from the lung, bronchial stump complications, and recurrent cancer.  She understood and agreed to proceed.  OPERATIVE PROCEDURE:  The patient was seen in the preoperative holding area.  Proper patient, proper operation, and proper operative side were confirmed with the patient after reviewing her chart and CT scan.  The left side of the chest was signed by me.  Consent was signed.  She was taken back to the operating room and placed on the table in supine position.  After induction of general endotracheal anesthesia, a Foley catheter was placed in bladder using sterile technique.  Lower extremity pneumatic compression devices were placed.  Then, a time-out was taken and proper patient, proper operation, proper operative side were confirmed with nursing and anesthesia staff.  Flexible fiberoptic bronchoscopy was performed.  The bronchoscope was advanced down the endotracheal tube and the distal trachea appeared normal.  The carina was sharp.  The right and left bronchial tree had normal segmental anatomy.  There were no endobronchial lesions and no sign of extrinsic compression.  The left upper lobe bronchus in particular appeared completely normal with normal length and I felt that upper lobectomy  could be performed if necessary.  The bronchoscope was withdrawn from the patient.  Then, the single-lumen tube was converted to a double- lumen tube by Anesthesiology.  The patient was positioned in the right lateral decubitus position with the left side up.  Left side of the chest prepped with Betadine soap and solution, draped in usual sterile manner.  A 2nd time-out was taken.  Proper patient, proper operation, proper operative side were confirmed with nursing and anesthesia staff. Then, the left chest was entered through a lateral thoracotomy incision. The pleural space was entered through the 4th  intercostal space.  The lung was partially adhesed to the chest wall but these adhesions were fairly loose and were taken down using electrocautery without difficulty.  There was excellent exposure.  Examination of the left upper lobe showed the palpable nodule in the apical portion.  After mobilization of the upper lobe from the adhesions, I felt there was an adequate amount of lung tissue around the lesion to allow wedge resection.  Then, using 60 mm purple staplers, a wedge resection was performed to remove the lesion with surrounding grossly normal-appearing lung tissue.  The specimen was then sent to Pathology for frozen section and Dr. Colonel Bald called the operating room and said that the lesion was indeed clear cell carcinoma and the surgical margin was negative.  Then ProGel was applied over the staple line.  An On-Q catheter was brought through a separate stab incision and tunneled in a subpleural location from below the level of the thoracotomy to about 2 interspaces above the thoracotomy.  This was flushed with 5 mL of 0.5% Marcaine and connected to the reservoir with 0.5% Marcaine.  Then, two 32-French Blake drains were brought in through separate stab incisions and positioned posteriorly and anteriorly.  The ribs were reapproximated using #2 Vicryl pericostal sutures.  The lower portion of the pericostal suture was placed through the center of the rib after drilling holes to prevent pressure on the intercostal nerves.  The lung was reinflated under direct inspection and sutures tied.  Then the muscles were reapproximated with #1 Vicryl continuous suture.  Subcutaneous tissue was closed with continuous 2-0 Vicryl and the skin with a 3-0 Vicryl subcuticular closure.  The sponge, needle, and instrument counts were correct according to the scrub nurse.  Dry sterile dressing was applied over the incision and around the chest tubes, which were hooked to Pleur- Evac suction.  The patient  was then turned in the supine position.  She was extubated and transported to the postanesthesia care unit in satisfactory and stable condition.  It should be noted that the fissure between the left upper and lower lobe was essentially complete and therefore if the patient did develop another metastasis in the left upper lobe, it would be possible to go back in and perform a completion left upper lobectomy.     Evelene Croon, M.D.     BB/MEDQ  D:  05/09/2012  T:  05/10/2012  Job:  [id number]

## 2012-05-10 NOTE — Progress Notes (Signed)
POD # 1 wedge resection  Comfortable  BP 142/64  Pulse 88  Temp(Src) 98.2 F (36.8 C) (Oral)  Resp 21  Ht 5\' 4"  (1.626 m)  Wt 221 lb 5.5 oz (100.4 kg)  BMI 37.97 kg/m2  SpO2 95%   Intake/Output Summary (Last 24 hours) at 05/10/12 1844 Last data filed at 05/10/12 1700  Gross per 24 hour  Intake 1596.75 ml  Output   4160 ml  Net -2563.25 ml    Continue present care

## 2012-05-10 NOTE — Progress Notes (Signed)
1 Day Post-Op Procedure(s) (LRB): VIDEO BRONCHOSCOPY (N/A) THORACOTOMY MAJOR (Left) LEFT UPPER LOBE WEDGE RESECTION (Left) Subjective: No complaints.  ambulated  Objective: Vital signs in last 24 hours: Temp:  [97.2 F (36.2 C)-98.3 F (36.8 C)] 97.4 F (36.3 C) (04/11 1205) Pulse Rate:  [73-112] 87 (04/11 1100) Cardiac Rhythm:  [-] Normal sinus rhythm (04/11 0800) Resp:  [13-32] 28 (04/11 1100) BP: (114-157)/(63-90) 114/63 mmHg (04/11 1100) SpO2:  [89 %-100 %] 93 % (04/11 1100) Arterial Line BP: (82-200)/(65-86) 156/73 mmHg (04/11 0600) Weight:  [100.4 kg (221 lb 5.5 oz)] 100.4 kg (221 lb 5.5 oz) (04/11 0700)  Hemodynamic parameters for last 24 hours:    Intake/Output from previous day: 04/10 0701 - 04/11 0700 In: 3042.8 [P.O.:570; I.V.:2122.8; IV Piggyback:350] Out: 3610 [Urine:3090; Blood:50; Chest Tube:470] Intake/Output this shift: Total I/O In: 428 [P.O.:75; I.V.:303; IV Piggyback:50] Out: 505 [Urine:475; Chest Tube:30]  General appearance: alert and cooperative Heart: regular rate and rhythm, S1, S2 normal, no murmur, click, rub or gallop Lungs: clear to auscultation bilaterally small air leak from chest tubes.  Lab Results:  Recent Labs  05/10/12 0330  WBC 13.2*  HGB 13.5  HCT 39.4  PLT 226   BMET:  Recent Labs  05/10/12 0330  NA 137  K 4.0  CL 104  CO2 24  GLUCOSE 128*  BUN 11  CREATININE 0.89  CALCIUM 9.2    PT/INR: No results found for this basename: LABPROT, INR,  in the last 72 hours ABG    Component Value Date/Time   PHART 7.372 05/10/2012 0345   HCO3 26.7* 05/10/2012 0345   TCO2 28 05/10/2012 0345   ACIDBASEDEF 1.9 05/07/2012 1131   O2SAT 95.0 05/10/2012 0345   CBG (last 3)   Recent Labs  05/10/12 0343 05/10/12 0816 05/10/12 1201  GLUCAP 115* 161* 126*   CXR:  Lungs clear. No ptx or effusion.  Assessment/Plan: S/P Procedure(s) (LRB): VIDEO BRONCHOSCOPY (N/A) THORACOTOMY MAJOR (Left) LEFT UPPER LOBE WEDGE RESECTION  (Left) Mobilize Continue foley due to patient in ICU and urinary output monitoring Chest tubes to 10 cm suction. Wait for air leak to stop.   LOS: 1 day    BARTLE,BRYAN K 05/10/2012

## 2012-05-11 ENCOUNTER — Inpatient Hospital Stay (HOSPITAL_COMMUNITY): Payer: Medicare Other

## 2012-05-11 LAB — COMPREHENSIVE METABOLIC PANEL
BUN: 9 mg/dL (ref 6–23)
Calcium: 9.3 mg/dL (ref 8.4–10.5)
Creatinine, Ser: 0.91 mg/dL (ref 0.50–1.10)
GFR calc Af Amer: 73 mL/min — ABNORMAL LOW (ref >90)
GFR calc non Af Amer: 63 mL/min — ABNORMAL LOW (ref >90)
Glucose, Bld: 121 mg/dL — ABNORMAL HIGH (ref 70–99)
Sodium: 141 mEq/L (ref 135–145)
Total Protein: 6.5 g/dL (ref 6.0–8.3)

## 2012-05-11 LAB — CBC
HCT: 40.9 % (ref 36.0–46.0)
Hemoglobin: 13.4 g/dL (ref 12.0–15.0)
MCH: 29.9 pg (ref 26.0–34.0)
MCHC: 32.8 g/dL (ref 30.0–36.0)
MCV: 91.3 fL (ref 78.0–100.0)

## 2012-05-11 MED ORDER — GUAIFENESIN ER 600 MG PO TB12
1200.0000 mg | ORAL_TABLET | Freq: Two times a day (BID) | ORAL | Status: AC
  Administered 2012-05-11 – 2012-05-15 (×10): 1200 mg via ORAL
  Filled 2012-05-11 (×10): qty 2

## 2012-05-11 NOTE — Progress Notes (Signed)
2 Days Post-Op Procedure(s) (LRB): VIDEO BRONCHOSCOPY (N/A) THORACOTOMY MAJOR (Left) LEFT UPPER LOBE WEDGE RESECTION (Left) Subjective: Some discomfort from incision, tube, but overall feels well  Objective: Vital signs in last 24 hours: Temp:  [97.4 F (36.3 C)-98.7 F (37.1 C)] 98 F (36.7 C) (04/12 0744) Pulse Rate:  [85-102] 96 (04/12 0800) Cardiac Rhythm:  [-] Bundle branch block (04/12 0800) Resp:  [16-28] 20 (04/12 0800) BP: (114-162)/(58-118) 127/79 mmHg (04/12 0800) SpO2:  [35 %-98 %] 94 % (04/12 0845) Weight:  [220 lb 14.4 oz (100.2 kg)] 220 lb 14.4 oz (100.2 kg) (04/12 0700)  Hemodynamic parameters for last 24 hours:    Intake/Output from previous day: 04/11 0701 - 04/12 0700 In: 1405 [P.O.:75; I.V.:1280; IV Piggyback:50] Out: 3360 [Urine:3150; Chest Tube:210] Intake/Output this shift: Total I/O In: 150 [I.V.:150] Out: 180 [Urine:70; Chest Tube:110]  General appearance: alert and no distress Neurologic: intact Heart: regular rate and rhythm Lungs: diminished breath sounds bibasilar Abdomen: normal findings: soft, non-tender  Lab Results:  Recent Labs  05/10/12 0330 05/11/12 0440  WBC 13.2* 12.8*  HGB 13.5 13.4  HCT 39.4 40.9  PLT 226 244   BMET:  Recent Labs  05/10/12 0330 05/11/12 0440  NA 137 141  K 4.0 4.2  CL 104 103  CO2 24 29  GLUCOSE 128* 121*  BUN 11 9  CREATININE 0.89 0.91  CALCIUM 9.2 9.3    PT/INR: No results found for this basename: LABPROT, INR,  in the last 72 hours ABG    Component Value Date/Time   PHART 7.372 05/10/2012 0345   HCO3 26.7* 05/10/2012 0345   TCO2 28 05/10/2012 0345   ACIDBASEDEF 1.9 05/07/2012 1131   O2SAT 95.0 05/10/2012 0345   CBG (last 3)   Recent Labs  05/10/12 1201 05/10/12 1610 05/10/12 1952  GLUCAP 126* 112* 138*    Assessment/Plan: S/P Procedure(s) (LRB): VIDEO BRONCHOSCOPY (N/A) THORACOTOMY MAJOR (Left) LEFT UPPER LOBE WEDGE RESECTION (Left) POD # 1 Redo VATS, LUL wedge  CV-  stable  RESP- small air leak- keep tube to suction today  Basilar atelectasis L > R- IS, cough, deep breathe  RENAL- lytes, creatinine OK  Mobilize  Transfer to 3300   LOS: 2 days    Nelwyn Hebdon C 05/11/2012

## 2012-05-12 ENCOUNTER — Inpatient Hospital Stay (HOSPITAL_COMMUNITY): Payer: Medicare Other

## 2012-05-12 MED ORDER — IPRATROPIUM BROMIDE 0.02 % IN SOLN: 0.5000 mg | Freq: Three times a day (TID) | RESPIRATORY_TRACT | Status: DC

## 2012-05-12 MED ORDER — LEVALBUTEROL HCL 0.63 MG/3ML IN NEBU
0.6300 mg | INHALATION_SOLUTION | Freq: Three times a day (TID) | RESPIRATORY_TRACT | Status: DC
  Administered 2012-05-12 – 2012-05-15 (×11): 0.63 mg via RESPIRATORY_TRACT
  Filled 2012-05-12 (×18): qty 3

## 2012-05-12 MED ORDER — LEVALBUTEROL HCL 0.63 MG/3ML IN NEBU: 0.6300 mg | INHALATION_SOLUTION | Freq: Four times a day (QID) | RESPIRATORY_TRACT | Status: DC | PRN

## 2012-05-12 MED ORDER — POLYETHYLENE GLYCOL 3350 17 G PO PACK
17.0000 g | PACK | Freq: Once | ORAL | Status: AC
  Administered 2012-05-12: 17 g via ORAL
  Filled 2012-05-12: qty 1

## 2012-05-12 NOTE — Progress Notes (Signed)
3 Days Post-Op Procedure(s) (LRB): VIDEO BRONCHOSCOPY (N/A) THORACOTOMY MAJOR (Left) LEFT UPPER LOBE WEDGE RESECTION (Left) Subjective:  Ms. Jabs continues to have some pain this morning.  This is relieved with PCA  Objective: Vital signs in last 24 hours: Temp:  [97.4 F (36.3 C)-99 F (37.2 C)] 98.1 F (36.7 C) (04/13 0800) Pulse Rate:  [88-108] 93 (04/13 0755) Cardiac Rhythm:  [-] Bundle branch block (04/12 2000) Resp:  [18-28] 27 (04/13 0755) BP: (94-145)/(45-83) 134/63 mmHg (04/13 0755) SpO2:  [91 %-98 %] 95 % (04/13 0755)  Intake/Output from previous day: 04/12 0701 - 04/13 0700 In: 988 [P.O.:360; I.V.:628] Out: 1715 [Urine:1475; Chest Tube:240] Intake/Output this shift: Total I/O In: 20 [I.V.:20] Out: 70 [Chest Tube:70]  General appearance: alert, cooperative and no distress Heart: regular rate and rhythm Lungs: wheezes expiratory on left Abdomen: soft, non-tender; bowel sounds normal; no masses,  no organomegaly Wound: clean and dry  Lab Results:  Recent Labs  05/10/12 0330 05/11/12 0440  WBC 13.2* 12.8*  HGB 13.5 13.4  HCT 39.4 40.9  PLT 226 244   BMET:  Recent Labs  05/10/12 0330 05/11/12 0440  NA 137 141  K 4.0 4.2  CL 104 103  CO2 24 29  GLUCOSE 128* 121*  BUN 11 9  CREATININE 0.89 0.91  CALCIUM 9.2 9.3    PT/INR: No results found for this basename: LABPROT, INR,  in the last 72 hours ABG    Component Value Date/Time   PHART 7.372 05/10/2012 0345   HCO3 26.7* 05/10/2012 0345   TCO2 28 05/10/2012 0345   ACIDBASEDEF 1.9 05/07/2012 1131   O2SAT 95.0 05/10/2012 0345   CBG (last 3)   Recent Labs  05/10/12 1201 05/10/12 1610 05/10/12 1952  GLUCAP 126* 112* 138*    Assessment/Plan: S/P Procedure(s) (LRB): VIDEO BRONCHOSCOPY (N/A) THORACOTOMY MAJOR (Left) LEFT UPPER LOBE WEDGE RESECTION (Left)  1. Chest tube-+ air leak with cough, will leave tube to suction today, no CXR ordered for this morning 2. Pulm- good use of IS, continue  nebulizers 3. Pain control- continue PCA, oral agents as needed 4. Dispo- continue current care   LOS: 3 days    Lowella Dandy 05/12/2012

## 2012-05-13 ENCOUNTER — Inpatient Hospital Stay (HOSPITAL_COMMUNITY): Payer: Medicare Other

## 2012-05-13 NOTE — Progress Notes (Addendum)
                    [address]            [place] [address]          [phone]     4 Days Post-Op Procedure(s) (LRB): VIDEO BRONCHOSCOPY (N/A) THORACOTOMY MAJOR (Left) LEFT UPPER LOBE WEDGE RESECTION (Left)  Subjective: Feels well, no complaints.  Breathing stable.  Ambulating in halls.   Objective: Vital signs in last 24 hours: Patient Vitals for the past 24 hrs:  BP Temp Temp src Pulse Resp SpO2  05/13/12 0802 - 98 F (36.7 C) Oral - - -  05/13/12 0531 129/64 mmHg 98.5 F (36.9 C) Oral 90 18 94 %  05/13/12 0518 - - - - 24 98 %  05/13/12 0015 132/58 mmHg 98.4 F (36.9 C) Oral 96 22 94 %  05/12/12 2344 - - - - 22 -  05/12/12 2202 - - - 95 24 93 %  05/12/12 2037 - - - - - 94 %  05/12/12 2000 - - - - 21 93 %  05/12/12 1932 132/64 mmHg 98.9 F (37.2 C) Oral 101 21 91 %  05/12/12 1610 - - - - 14 95 %  05/12/12 1601 - 97.8 F (36.6 C) Oral - - -  05/12/12 1545 133/64 mmHg - - 104 20 93 %  05/12/12 1506 - - - - - 98 %  05/12/12 1225 - - - - 24 95 %  05/12/12 1205 123/57 mmHg - - 95 21 93 %  05/12/12 1200 - 98.4 F (36.9 C) Oral - - -  05/12/12 0957 - - - - - 95 %   Current Weight  05/11/12 220 lb 14.4 oz (100.2 kg)     Intake/Output from previous day: 04/13 0701 - 04/14 0700 In: 960 [P.O.:480; I.V.:480] Out: 1685 [Urine:1525; Chest Tube:160]    PHYSICAL EXAM:  Heart: RRR Lungs: Few coarse BS on L Wound: Clean and dry Chest tube: Some tidaling with tiny air leak with strong cough    Lab Results: CBC: Recent Labs  05/11/12 0440  WBC 12.8*  HGB 13.4  HCT 40.9  PLT 244   BMET:  Recent Labs  05/11/12 0440  NA 141  K 4.2  CL 103  CO2 29  GLUCOSE 121*  BUN 9  CREATININE 0.91  CALCIUM 9.3    PT/INR: No results found for this basename: LABPROT, INR,  in the last 72 hours  CXR: Findings: Two left-sided thoracostomy tubes remain in unchanged position. There is a tiny (less than 5%) left apical pneumothorax.  Small  subcutaneous emphysema along the left lateral chest wall.  Patchy airspace disease verses atelectasis in the left lower lobe is similar to prior. Cardiac and mediastinal contours remain unchanged. No acute osseous abnormality.  IMPRESSION:  1. Trace left apical pneumothorax.  2. Stable position of dual left-sided thoracostomy tubes.  3. Stable left basilar infiltrate and / or atelectasis.    Assessment/Plan: S/P Procedure(s) (LRB): VIDEO BRONCHOSCOPY (N/A) THORACOTOMY MAJOR (Left) LEFT UPPER LOBE WEDGE RESECTION (Left) Stable overall. Tiny air leak with cough.  Hopefully can consider d/c 1 CT soon. Continue pulm toilet, ambulation.   LOS: 4 days    COLLINS,GINA H 05/13/2012  Air leak has decreased on water seal, hopefully will resolve soon Overall doing well

## 2012-05-13 NOTE — Progress Notes (Signed)
Utilization review completed.  

## 2012-05-14 ENCOUNTER — Inpatient Hospital Stay (HOSPITAL_COMMUNITY): Payer: Medicare Other

## 2012-05-14 MED ORDER — MENTHOL 3 MG MT LOZG
1.0000 | LOZENGE | OROMUCOSAL | Status: DC | PRN
  Filled 2012-05-14: qty 9

## 2012-05-14 NOTE — Progress Notes (Addendum)
T[facility] DAILY PROGRESS NOTE                   [address]            [place] [address]          [phone]      5 Days Post-Op Procedure(s) (LRB): VIDEO BRONCHOSCOPY (N/A) THORACOTOMY MAJOR (Left) LEFT UPPER LOBE WEDGE RESECTION (Left)  Total Length of Stay:  LOS: 5 days   Subjective: Feels ok, not SOB off O2  Objective: Vital signs in last 24 hours: Temp:  [98.6 F (37 C)-99 F (37.2 C)] 98.8 F (37.1 C) (04/15 0700) Pulse Rate:  [88-108] 88 (04/15 0422) Cardiac Rhythm:  [-] Normal sinus rhythm (04/15 0422) Resp:  [20-25] 21 (04/15 0422) BP: (123-139)/(53-83) 130/53 mmHg (04/15 0700) SpO2:  [90 %-98 %] 98 % (04/15 0826)  Filed Weights   05/10/12 0700 05/11/12 0700  Weight: 221 lb 5.5 oz (100.4 kg) 220 lb 14.4 oz (100.2 kg)    Weight change:    Hemodynamic parameters for last 24 hours:    Intake/Output from previous day: 04/14 0701 - 04/15 0700 In: 719 [P.O.:480; I.V.:239] Out: 1252 [Urine:1052; Chest Tube:200]  Intake/Output this shift:    Current Meds: Scheduled Meds: . bisacodyl  10 mg Oral Daily  . fentaNYL   Intravenous Q4H  . guaiFENesin  1,200 mg Oral BID  . levalbuterol  0.63 mg Nebulization TID  . omega-3 acid ethyl esters  2 g Oral Daily   Continuous Infusions: . dextrose 5 % and 0.9% NaCl Stopped (05/14/12 0400)   PRN Meds:.colchicine, diphenhydrAMINE, diphenhydrAMINE, levalbuterol, naloxone, ondansetron (ZOFRAN) IV, ondansetron (ZOFRAN) IV, oxyCODONE-acetaminophen, potassium chloride, senna-docusate, sodium chloride, traMADol  General appearance: alert, cooperative and no distress Heart: regular rate and rhythm Lungs: mildly din in bases Abdomen: benign Extremities: no edema Wound: incis healing well  Lab Results: CBC:No results found for this basename: WBC, HGB, HCT, PLT,  in the last 72 hours BMET: No results found for this basename: NA, K, CL, CO2, GLUCOSE, BUN, CREATININE, CALCIUM,  in the last 72 hours  PT/INR:  No results found for this basename: LABPROT, INR,  in the last 72 hours Radiology: Dg Chest Port 1 View  05/14/2012  *RADIOLOGY REPORT*  Clinical Data: Postop left VATS.  Follow up left pneumothorax.  PORTABLE CHEST - 1 VIEW 05/14/2012 0554 hours:  Comparison: Portable chest x-rays yesterday and dating back to 05/10/2012.  Findings: 2 left-sided chest tubes remain in place with a tiny left apical and basilar pneumothorax, less than 5%.  Post-surgical changes on the left with residual atelectasis at the base of the remaining left lung.  Right lung remains clear.  Cardiac silhouette enlarged but stable.  Pulmonary vascularity normal.  IMPRESSION: Tiny left apical and basilar pneumothorax, less than 5%, with left chest tubes in place.  Stable left basilar atelectasis.  No new abnormalities.   Original Report Authenticated By: Hulan Saas, M.D.    Dg Chest Port 1 View  05/13/2012  *RADIOLOGY REPORT*  Clinical Data: There weeks status post left of fat  PORTABLE CHEST - 1 VIEW  Comparison: Prior chest x-ray 05/12/2012  Findings: Two left-sided thoracostomy tubes remain in unchanged position.  There is a tiny (less than 5%) left apical pneumothorax. Small subcutaneous emphysema along the left lateral chest wall. Patchy airspace disease verses atelectasis in the left lower lobe is similar to prior.  Cardiac and mediastinal contours remain unchanged.  No acute osseous abnormality.  IMPRESSION:  1.  Trace left apical pneumothorax. 2.  Stable position of dual left-sided thoracostomy tubes. 3.  Stable left basilar infiltrate and / or atelectasis.   Original Report Authenticated By: Malachy Moan, M.D.    Dg Chest Port 1 View  05/12/2012  *RADIOLOGY REPORT*  Clinical Data: Status post VATS.  Air leak.  Left chest tubes.  PORTABLE CHEST - 1 VIEW  Comparison: Single view chest 05/11/2012.  Findings: Two left chest tubes remain in place.  No pneumothorax is identified.  Opacity in the left mid and lower lung zones  appears slightly improved.  Right lung is expanded and clear.  Cardiomegaly noted.  IMPRESSION:  1.  Negative for pneumothorax with two left chest tubes in place. 2.  Some improvement and left mid and lower lung zone airspace disease.   Original Report Authenticated By: Holley Dexter, M.D.      Assessment/Plan: S/P Procedure(s) (LRB): VIDEO BRONCHOSCOPY (N/A) THORACOTOMY MAJOR (Left) LEFT UPPER LOBE WEDGE RESECTION (Left)  1. Doing well, will d/c CT with small pntx and small intermittent air leak 2 cont pulm toilet/rehab    GOLD,WAYNE E 05/14/2012 8:40 AM   Patient seen and examined. Agree with above Dc posterior CT today, hopefully can get other tube out tomorrow

## 2012-05-14 NOTE — Progress Notes (Signed)
Posterior chest tube removed per order. Site redressed and a new atruim drainage system has been applied.Patient tolerated procedure well.

## 2012-05-14 NOTE — Progress Notes (Signed)
Have wasted 13 cc of fentanyl PCA per order. Okey Regal S.,rn witnessed the waste in the sink.

## 2012-05-15 ENCOUNTER — Inpatient Hospital Stay (HOSPITAL_COMMUNITY): Payer: Medicare Other

## 2012-05-15 MED ORDER — OXYCODONE-ACETAMINOPHEN 5-325 MG PO TABS: 1.0000 | ORAL_TABLET | ORAL | Status: DC | PRN

## 2012-05-15 MED ORDER — LEVALBUTEROL HCL 0.63 MG/3ML IN NEBU: 0.6300 mg | INHALATION_SOLUTION | RESPIRATORY_TRACT | Status: DC | PRN

## 2012-05-15 NOTE — Progress Notes (Addendum)
                    [address]            [place] [address]          [phone]     6 Days Post-Op Procedure(s) (LRB): VIDEO BRONCHOSCOPY (N/A) THORACOTOMY MAJOR (Left) LEFT UPPER LOBE WEDGE RESECTION (Left)  Subjective: Comfortable, no complaints.  Breathing stable.   Objective: Vital signs in last 24 hours: Patient Vitals for the past 24 hrs:  BP Temp Temp src Pulse Resp SpO2  05/15/12 0757 - 98.8 F (37.1 C) Oral - - -  05/15/12 0549 146/70 mmHg 98.3 F (36.8 C) Oral 88 22 96 %  05/15/12 0221 132/60 mmHg 98.4 F (36.9 C) Oral 88 20 96 %  05/14/12 2048 - - - - - 94 %  05/14/12 2000 141/60 mmHg 98.7 F (37.1 C) Oral 97 25 90 %  05/14/12 1617 - 99 F (37.2 C) Oral - - -  05/14/12 1330 130/55 mmHg 99.1 F (37.3 C) Oral 105 25 92 %   Current Weight  05/11/12 220 lb 14.4 oz (100.2 kg)     Intake/Output from previous day: 04/15 0701 - 04/16 0700 In: 240 [P.O.:240] Out: 144 [Urine:3; Stool:1; Chest Tube:140]    PHYSICAL EXAM:  Heart: RRR Lungs: Clear Wound: Clean and dry Chest tube: No air leak    Lab Results: CBC:No results found for this basename: WBC, HGB, HCT, PLT,  in the last 72 hours BMET: No results found for this basename: NA, K, CL, CO2, GLUCOSE, BUN, CREATININE, CALCIUM,  in the last 72 hours  PT/INR: No results found for this basename: LABPROT, INR,  in the last 72 hours  CXR: Findings: Trachea is midline. Heart size stable. There are postoperative changes in the left hemithorax with left basilar pleural parenchymal opacity, as before. Small left apical pneumothorax with one, possibly two, left chest tube(s) in place. Minimal right basilar atelectasis.  IMPRESSION:  1. Tiny left apical pneumothorax with left chest tube or tubes in  place.  2. Postoperative changes in the left hemithorax with left basilar  pleural parenchymal opacity, stable.  3. Minimal right basilar atelectasis.     Assessment/Plan: S/P Procedure(s)  (LRB): VIDEO BRONCHOSCOPY (N/A) THORACOTOMY MAJOR (Left) LEFT UPPER LOBE WEDGE RESECTION (Left) CXR stable, no air leak noted on exam.  Hopefully can d/c remaining CT today. Possible d/c home in am if remains stable after CT removal.   LOS: 6 days    COLLINS,GINA H 05/15/2012  Patient seen and examined. Agree with above Dc CT Probably home in AM

## 2012-05-15 NOTE — Progress Notes (Signed)
 [address]       [place] [address]             [phone]        Discharge Summary     Name: Amanda Crawford  DOB: 10/19/1945 67 y.o.  MRN: [id number]     Admission Date: 05/09/2012  Discharge Date: 05/16/2012     Admitting Diagnosis:  Left upper lobe mass     Discharge Diagnosis:  Clear cell carcinoma, left upper lobe   Past Medical History  Diagnosis Date  . Renal cell cancer     renal cell ca dx 9/01 and 8/08;  . Lung cancer   . Shortness of breath     occ  . Pneumonia     child  . GERD (gastroesophageal reflux disease)     occ  . Headache     migraines  . Arthritis       Procedures:  VIDEO BRONCHOSCOPY  LEFT THORACOTOMY, LEFT UPPER LOBE WEDGE RESECTION on 05/09/2012     HPI: The patient is a 67 y.o. female who underwent a left radical nephrectomy for T2, N0, M0 renal cell cancer in 2001. She was subsequently found to have a nodule in the lingula and underwent a large wedge resection of this lesion by Dr. Burney in 2008. The surgical margins were negative but there was lymphovascular invasion present. Lymph node biopsies at 10 L and 11 L were negative for tumor. She has been followed over the years for a small nodule in the left upper lobe. She was last seen by Dr. Burney in February of 2010. She had followup CT scans about one year ago as well as in October 2013 and compared to her recent CT scan showed progressive enlargement of this left upper lobe lung nodule. She was referred to Dr. Bartle for thoracic surgical evaluation. He felt that the best treatment would be to proceed with surgical resection at this time. All risks, benefits and alternatives of surgery were explained in detail, and the patient agreed to proceed.      Hospital Course: The patient was admitted to [facility] on 05/09/2012. The patient was taken to the operating room and underwent the above procedure.  The postoperative course was generally uneventful. She had  a small air leak which resolved over time with conservative management. All chest tubes have been removed, and follow up chest x-rays have remained stable. The patient has been weaned from supplemental oxygen and is maintaining O2 sats of greater than 90% on room air. She has been afebrile and vital signs are stable. She is ambulating in the halls and tolerating a regular diet. Pathology revealed clear cell carcinoma. She has been evaluated on today's date and is ready for discharge home.     Recent vital signs:  Filed Vitals:    05/15/12 0800   BP:  132/67   Pulse:  86   Temp:    Resp:  26      Recent laboratory studies:  CBC:No results found for this basename: WBC, HGB, HCT, PLT, in the last 72 hours  BMET: No results found for this basename: NA, K, CL, CO2, GLUCOSE, BUN, CREATININE, CALCIUM, in the last 72 hours  PT/INR: No results found for this basename: LABPROT, INR, in the last 72 hours      Discharge Medications:    Medication List     TAKE these medications       cholecalciferol 1000 UNITS tablet      Commonly known as: VITAMIN D    Take 1,000 Units by mouth daily.    colchicine 0.6 MG tablet    Take 0.6 mg by mouth daily as needed. For gout    diphenhydramine-acetaminophen 25-500 MG Tabs    Commonly known as: TYLENOL PM    Take 1 tablet by mouth at bedtime as needed. For insomnia    fish oil-omega-3 fatty acids 1000 MG capsule    Take 2 g by mouth daily.    glucosamine-chondroitin 500-400 MG tablet    Take 1 tablet by mouth daily.    oxyCODONE-acetaminophen 5-325 MG per tablet    Commonly known as: PERCOCET/ROXICET    Take 1-2 tablets by mouth every 4 (four) hours as needed for pain.        Discharge Instructions: The patient is to refrain from driving, heavy lifting or strenuous activity. May shower daily and clean incisions with soap and water. May resume regular diet.      Follow Up:       Future Appointments  Provider  Department  Dept Phone     05/22/2012 10:00 AM  [facility] Nurse  [facility] [place]  [phone]    06/05/2012 12:30 PM  Bryan K Bartle, MD  [facility] [place]  [phone]    06/06/2012 10:30 AM  Firas N Shadad, MD  [facility]  [phone]         Follow-up Information    Follow up with [facility] [place] NURSE On 05/22/2012. (RN will remove sutures at 10:00)       Follow up with BARTLE,BRYAN K, MD On 06/05/2012. (Have a chest x-ray at 11:30, then see Dr. Bartle at 12:30)    Contact information:    [address]  [address]  [place] [address]  [phone]       Schedule an appointment as soon as possible for a visit with SHADAD,FIRAS, MD. (Follow up as directed)    Contact information:    [address]  [place] [address]  [phone]      Inaaya Vellucci H  05/15/2012, 9:24 AM     

## 2012-05-16 ENCOUNTER — Inpatient Hospital Stay (HOSPITAL_COMMUNITY): Payer: Medicare Other

## 2012-05-16 NOTE — Care Management Note (Signed)
    Page 1 of 1   05/16/2012     11:32:49 AM   CARE MANAGEMENT NOTE 05/16/2012  Patient:  Amanda Crawford, Amanda Crawford   Account Number:  [id number]  Date Initiated:  05/10/2012  Documentation initiated by:  [facility]  Subjective/Objective Assessment:   post op lung surgery for CA.     Action/Plan:   PTA pt lived at home with spouse   Anticipated DC Date:  05/16/2012   Anticipated DC Plan:  HOME W HOME HEALTH SERVICES      DC Planning Services  CM consult      Choice offered to / List presented to:             Status of service:  Completed, signed off Medicare Important Message given?   (If response is "NO", the following Medicare IM given date fields will be blank) Date Medicare IM given:   Date Additional Medicare IM given:    Discharge Disposition:  HOME/SELF CARE  Per UR Regulation:  Reviewed for med. necessity/level of care/duration of stay  If discussed at Long Length of Stay Meetings, dates discussed:   05/16/2012    Comments:  ContactMirza, [place] [phone]  05-10-2012 9:20am Avie Arenas, RNBSN [phone] Patient sitting up in chair - unable to sleep last pm. Verified does have a husband that she lives with and will be with her 24/7 on discharge.  CM will continue to follow for further needs.

## 2012-05-16 NOTE — Progress Notes (Signed)
Discharge instructions given to pt and stepdaughter.  Both verbalized understanding with all questions answered.  Pt discharged to home with family  Roselie Awkward, RN

## 2012-05-16 NOTE — Progress Notes (Signed)
                    [address]            [place] [address]          [phone]     7 Days Post-Op Procedure(s) (LRB): VIDEO BRONCHOSCOPY (N/A) THORACOTOMY MAJOR (Left) LEFT UPPER LOBE WEDGE RESECTION (Left)  Subjective: Feels well, breathing stable.   Objective: Vital signs in last 24 hours: Patient Vitals for the past 24 hrs:  BP Temp Temp src Pulse Resp SpO2  05/16/12 0800 127/52 mmHg 98 F (36.7 C) Oral 81 19 92 %  05/16/12 0345 129/61 mmHg 98.4 F (36.9 C) Oral 87 20 96 %  05/16/12 0023 134/56 mmHg 99.3 F (37.4 C) Oral 102 22 97 %  05/15/12 2330 134/56 mmHg - - 96 22 96 %  05/15/12 2230 - - - - - 93 %  05/15/12 2000 135/59 mmHg 98.9 F (37.2 C) Oral 94 27 93 %  05/15/12 1950 135/59 mmHg - - 94 25 92 %  05/15/12 1600 128/64 mmHg 98.6 F (37 C) Oral 99 22 90 %  05/15/12 1200 132/66 mmHg 98.4 F (36.9 C) Oral 88 17 92 %  05/15/12 1019 - - - 93 22 92 %  05/15/12 0901 - - - - - 100 %   Current Weight  05/11/12 220 lb 14.4 oz (100.2 kg)     Intake/Output from previous day: 04/16 0701 - 04/17 0700 In: 240 [P.O.:240] Out: 50 [Chest Tube:50]    PHYSICAL EXAM:  Heart: RRR Lungs: Clear, slightly decreased BS in L base Wound: Clean and dry     Lab Results: CBC:No results found for this basename: WBC, HGB, HCT, PLT,  in the last 72 hours BMET: No results found for this basename: NA, K, CL, CO2, GLUCOSE, BUN, CREATININE, CALCIUM,  in the last 72 hours  PT/INR: No results found for this basename: LABPROT, INR,  in the last 72 hours   CXR: Findings: Cardiac silhouette is upper range of normal size. Ectasia and nonaneurysmal calcification of the thoracic aorta are seen. There is volume loss in the left lung with atelectasis and hazy opacity in the inferior left lung. There is indistinctness and blunting of the left costophrenic angle.There is a very tiny sliver of left apical pneumothorax.  IMPRESSION:  There is volume loss in the left lung  with atelectasis and hazy  opacity in the inferior left lung. There is indistinctness and  blunting of the left costophrenic angle. There is a very tiny  sliver of left apical pneumothorax.   Assessment/Plan: S/P Procedure(s) (LRB): VIDEO BRONCHOSCOPY (N/A) THORACOTOMY MAJOR (Left) LEFT UPPER LOBE WEDGE RESECTION (Left) Stable, doing well. Plan d/c home today-instructions reviewed with patient.   LOS: 7 days    Jamiesha Victoria H 05/16/2012

## 2012-05-18 NOTE — Discharge Summary (Signed)
[address]       [place] [address]             [phone]        Discharge Summary     Name: Amanda Crawford  DOB: Jun 14, 1945 67 y.o.  MRN: [id number]     Admission Date: 05/09/2012  Discharge Date: 05/16/2012     Admitting Diagnosis:  Left upper lobe mass     Discharge Diagnosis:  Clear cell carcinoma, left upper lobe   Past Medical History  Diagnosis Date  . Renal cell cancer     renal cell ca dx 9/01 and 8/08;  . Lung cancer   . Shortness of breath     occ  . Pneumonia     child  . GERD (gastroesophageal reflux disease)     occ  . Headache     migraines  . Arthritis       Procedures:  VIDEO BRONCHOSCOPY  LEFT THORACOTOMY, LEFT UPPER LOBE WEDGE RESECTION on 05/09/2012     HPI: The patient is a 67 y.o. female who underwent a left radical nephrectomy for T2, N0, M0 renal cell cancer in 2001. She was subsequently found to have a nodule in the lingula and underwent a large wedge resection of this lesion by Dr. Edwyna Shell in 2008. The surgical margins were negative but there was lymphovascular invasion present. Lymph node biopsies at 10 L and 11 L were negative for tumor. She has been followed over the years for a small nodule in the left upper lobe. She was last seen by Dr. Edwyna Shell in February of 2010. She had followup CT scans about one year ago as well as in October 2013 and compared to her recent CT scan showed progressive enlargement of this left upper lobe lung nodule. She was referred to Dr. Laneta Simmers for thoracic surgical evaluation. He felt that the best treatment would be to proceed with surgical resection at this time. All risks, benefits and alternatives of surgery were explained in detail, and the patient agreed to proceed.      Hospital Course: The patient was admitted to [facility] on 05/09/2012. The patient was taken to the operating room and underwent the above procedure.  The postoperative course was generally uneventful. She had  a small air leak which resolved over time with conservative management. All chest tubes have been removed, and follow up chest x-rays have remained stable. The patient has been weaned from supplemental oxygen and is maintaining O2 sats of greater than 90% on room air. She has been afebrile and vital signs are stable. She is ambulating in the halls and tolerating a regular diet. Pathology revealed clear cell carcinoma. She has been evaluated on today's date and is ready for discharge home.     Recent vital signs:  Filed Vitals:    05/15/12 0800   BP:  132/67   Pulse:  86   Temp:    Resp:  26      Recent laboratory studies:  CBC:No results found for this basename: WBC, HGB, HCT, PLT, in the last 72 hours  BMET: No results found for this basename: NA, K, CL, CO2, GLUCOSE, BUN, CREATININE, CALCIUM, in the last 72 hours  PT/INR: No results found for this basename: LABPROT, INR, in the last 72 hours      Discharge Medications:    Medication List     TAKE these medications       cholecalciferol 1000 UNITS tablet  Commonly known as: VITAMIN D    Take 1,000 Units by mouth daily.    colchicine 0.6 MG tablet    Take 0.6 mg by mouth daily as needed. For gout    diphenhydramine-acetaminophen 25-500 MG Tabs    Commonly known as: TYLENOL PM    Take 1 tablet by mouth at bedtime as needed. For insomnia    fish oil-omega-3 fatty acids 1000 MG capsule    Take 2 g by mouth daily.    glucosamine-chondroitin 500-400 MG tablet    Take 1 tablet by mouth daily.    oxyCODONE-acetaminophen 5-325 MG per tablet    Commonly known as: PERCOCET/ROXICET    Take 1-2 tablets by mouth every 4 (four) hours as needed for pain.        Discharge Instructions: The patient is to refrain from driving, heavy lifting or strenuous activity. May shower daily and clean incisions with soap and water. May resume regular diet.      Follow Up:       Future Appointments  Provider  Department  Dept Phone     05/22/2012 10:00 AM  [facility] Nurse  [facility] [place]  [phone]    06/05/2012 12:30 PM  Alleen Borne, MD  [facility]  [phone]    06/06/2012 10:30 AM  Benjiman Core, MD  [facility]  [phone]         Follow-up Information    Follow up with [facility] NURSE On 05/22/2012. (RN will remove sutures at 10:00)       Follow up with Alleen Borne, MD On 06/05/2012. (Have a chest x-ray at 11:30, then see Dr. Laneta Simmers at 12:30)    Contact information:    [address]  [address]  [place] [address]  [phone]       Schedule an appointment as soon as possible for a visit with [facility], MD. (Follow up as directed)    Contact information:    [address]  [place] [address]  [phone]      Weltha Cathy H  05/15/2012, 9:24 AM

## 2012-05-20 ENCOUNTER — Other Ambulatory Visit: Payer: Self-pay | Admitting: *Deleted

## 2012-05-20 DIAGNOSIS — C3490 Malignant neoplasm of unspecified part of unspecified bronchus or lung: Secondary | ICD-10-CM

## 2012-05-22 ENCOUNTER — Ambulatory Visit (INDEPENDENT_AMBULATORY_CARE_PROVIDER_SITE_OTHER): Payer: Self-pay

## 2012-05-22 DIAGNOSIS — D381 Neoplasm of uncertain behavior of trachea, bronchus and lung: Secondary | ICD-10-CM

## 2012-05-22 DIAGNOSIS — Z4802 Encounter for removal of sutures: Secondary | ICD-10-CM

## 2012-05-24 NOTE — Progress Notes (Signed)
Removed 2 sutures from chest tube site. Pt tolerated well. No signs of infection

## 2012-06-04 ENCOUNTER — Other Ambulatory Visit: Payer: Self-pay

## 2012-06-04 DIAGNOSIS — G8918 Other acute postprocedural pain: Secondary | ICD-10-CM

## 2012-06-04 MED ORDER — TRAMADOL HCL 50 MG PO TABS: 50.0000 mg | ORAL_TABLET | Freq: Four times a day (QID) | ORAL | Status: DC | PRN

## 2012-06-04 NOTE — Telephone Encounter (Signed)
RX for Tramadol 50 mg 1 po every 6-8 hours prn pain #40 no refills called to [facility].

## 2012-06-05 ENCOUNTER — Ambulatory Visit: Payer: Medicare Other | Admitting: Surgery

## 2012-06-06 ENCOUNTER — Telehealth: Payer: Self-pay | Admitting: Oncology

## 2012-06-06 ENCOUNTER — Ambulatory Visit (HOSPITAL_BASED_OUTPATIENT_CLINIC_OR_DEPARTMENT_OTHER): Payer: Medicare Other | Admitting: Oncology

## 2012-06-06 VITALS — BP 131/67 | HR 81 | Temp 97.5°F | Resp 18 | Ht 64.0 in | Wt 217.0 lb

## 2012-06-06 DIAGNOSIS — C7800 Secondary malignant neoplasm of unspecified lung: Secondary | ICD-10-CM

## 2012-06-06 DIAGNOSIS — C649 Malignant neoplasm of unspecified kidney, except renal pelvis: Secondary | ICD-10-CM

## 2012-06-06 NOTE — Progress Notes (Signed)
Hematology and Oncology Follow Up Visit  Amanda Crawford [id number] Jun 23, 1945 67 y.o. 06/06/2012 10:38 AM  CC: Amanda Crawford, M.D.  Amanda Crawford, M.D.    Principle Diagnosis: This is a 67 year old female diagnosed with renal cell carcinoma, diagnosed in 2001, had a T2 disease and had recurrent disease in a lung nodule in 2000.  Prior Therapy: She is status post left nephrectomy in 2001. She developed a left lingular mass, status post VATS procedure.  Pathology revealed a recurrent renal cell carcinoma resected in October 2008.  Has been really clear of disease since that time. She under went a VATS and LEFT UPPER LOBE WEDGE RESECTION of a tumor done on 05/09/2012.   Current therapy: Observation and surveillance.  Interim History: Amanda Crawford presents today for a followup visit. Since her last visit she underwent left VATS and resection of the lung nodule. She did well without complications and recovering slowly at this time. She has been doing relatively well.  She had not reported any neurological symptoms.  Had not reported any peripheral neuropathy, not about any loss of bowel or bladder function.She did not report any constitutional symptoms, did not report any weight loss, did not report any difficulty breathing.  She continued to perform more activities of daily living without any major hindrance or decline. She report an episode of gout that has resolved now.  She still report some  pain around the incision site.   Medications: I have reviewed the patient's current medications. Current outpatient prescriptions:cholecalciferol (VITAMIN D) 1000 UNITS tablet, Take 1,000 Units by mouth daily., Disp: , Rfl: ;  colchicine 0.6 MG tablet, Take 0.6 mg by mouth daily as needed. For gout, Disp: , Rfl: ;  diphenhydramine-acetaminophen (TYLENOL PM) 25-500 MG TABS, Take 1 tablet by mouth at bedtime as needed. For insomnia, Disp: , Rfl: ;  fish oil-omega-3 fatty acids 1000 MG capsule, Take 2 g by mouth  daily., Disp: , Rfl:  glucosamine-chondroitin 500-400 MG tablet, Take 1 tablet by mouth daily., Disp: , Rfl:   Allergies: No Known Allergies  Past Medical History, Surgical history, Social history, and Family History were reviewed and updated.  Review of Systems: Constitutional:  Negative for fever, chills, night sweats, anorexia, weight loss, pain. Cardiovascular: no chest pain or dyspnea on exertion Respiratory: negative Neurological: negative Dermatological: negative ENT: negative Skin: Negative. Gastrointestinal: negative Genito-Urinary: negative Hematological and Lymphatic: negative Breast: negative Musculoskeletal: negative Remaining ROS negative. Physical Exam: Blood pressure 131/67, pulse 81, temperature 97.5 F (36.4 C), temperature source Oral, resp. rate 18, height 5\' 4"  (1.626 m), weight 217 lb (98.431 kg), SpO2 96.00%. ECOG: 1 General appearance: alert Head: Normocephalic, without obvious abnormality, atraumatic Neck: no adenopathy, no carotid bruit, no JVD, supple, symmetrical, trachea midline and thyroid not enlarged, symmetric, no tenderness/mass/nodules Lymph nodes: Cervical, supraclavicular, and axillary nodes normal. Heart:regular rate and rhythm, S1, S2 normal, no murmur, click, rub or gallop Lung:chest clear, no wheezing, rales, normal symmetric air entry Abdomin: soft, non-tender, without masses or organomegaly EXT:no erythema, induration, or nodules   Lab Results: Lab Results  Component Value Date   WBC 12.8* 05/11/2012   HGB 13.4 05/11/2012   HCT 40.9 05/11/2012   MCV 91.3 05/11/2012   PLT 244 05/11/2012     Chemistry      Component Value Date/Time   NA 141 05/11/2012 0440   NA 140 04/16/2012 0923   NA 137 07/05/2010 1117   K 4.2 05/11/2012 0440   K 4.4 04/16/2012 1308  K 4.9* 07/05/2010 1117   CL 103 05/11/2012 0440   CL 107 04/16/2012 0923   CL 96* 07/05/2010 1117   CO2 29 05/11/2012 0440   CO2 21* 04/16/2012 0923   CO2 29 07/05/2010 1117   BUN 9  05/11/2012 0440   BUN 19.6 04/16/2012 0923   BUN 25* 07/05/2010 1117   CREATININE 0.91 05/11/2012 0440   CREATININE 1.0 04/16/2012 0923   CREATININE 1.4* 07/05/2010 1117      Component Value Date/Time   CALCIUM 9.3 05/11/2012 0440   CALCIUM 9.9 04/16/2012 0923   CALCIUM 9.9 07/05/2010 1117   ALKPHOS 52 05/11/2012 0440   ALKPHOS 64 04/16/2012 0923   ALKPHOS 65 07/05/2010 1117   AST 28 05/11/2012 0440   AST 35* 04/16/2012 0923   AST 34 07/05/2010 1117   ALT 36* 05/11/2012 0440   ALT 49 04/16/2012 0923   BILITOT 0.7 05/11/2012 0440   BILITOT 0.46 04/16/2012 0923   BILITOT 1.00 07/05/2010 1117     Pathology 05/09/2012.  Diagnosis Lung, wedge biopsy/resection, Left upper lobe - CLEAR CELL CARCINOMA. MARGINS NOT INVOLVED.  Impression and Plan:   This is a pleasant 67 year-old female with the following issues: 1. Renal cell carcinoma.  She has stage IV NED disease with another lung nodule removed in 04/2012. The pathology confirmed that indeed this metastatic renal cell cancer but solitary in nature. At this time, I see no role for adjuvant treatment but she needs to close observation. I will repeat CT scans in 3 months and will use systemic therapy if she develops multiple lesions at that time.  2.     Back pain: resolved.    [facility], MD 5/8/201410:38 AM

## 2012-06-11 ENCOUNTER — Other Ambulatory Visit: Payer: Self-pay | Admitting: *Deleted

## 2012-06-12 ENCOUNTER — Ambulatory Visit (INDEPENDENT_AMBULATORY_CARE_PROVIDER_SITE_OTHER): Payer: Self-pay | Admitting: Surgery

## 2012-06-12 ENCOUNTER — Encounter: Payer: Self-pay | Admitting: Surgery

## 2012-06-12 ENCOUNTER — Ambulatory Visit
Admission: RE | Admit: 2012-06-12 | Discharge: 2012-06-12 | Disposition: A | Discharge status: Home / Self Care | Payer: Medicare Other | Source: Ambulatory Visit | Attending: Surgery | Admitting: Surgery

## 2012-06-12 DIAGNOSIS — C3490 Malignant neoplasm of unspecified part of unspecified bronchus or lung: Secondary | ICD-10-CM

## 2012-06-12 DIAGNOSIS — C3410 Malignant neoplasm of upper lobe, unspecified bronchus or lung: Secondary | ICD-10-CM

## 2012-06-12 NOTE — Progress Notes (Signed)
   [address]       Amanda Crawford [address]             [phone]      HPI: Patient returns for routine postoperative follow-up having undergone left thoracotomy with wedge resection of a left upper lobe lung nodule on 05/09/2012. The patient's early postoperative recovery while in the hospital was notable for an uncomplicated postoperative course. The final pathology showed this to be clear cell carcinoma consistent with a metastasis from the patient's prior renal cell cancer. Since hospital discharge the patient reports that she has been improving. She still has significant left chest wall pain which particularly bothers her at night and keeps her from sleeping well. During the day she has been fairly active.   Current Outpatient Prescriptions  Medication Sig Dispense Refill  . cholecalciferol (VITAMIN D) 1000 UNITS tablet Take 1,000 Units by mouth daily.      . colchicine 0.6 MG tablet Take 0.6 mg by mouth daily as needed. For gout      . diphenhydramine-acetaminophen (TYLENOL PM) 25-500 MG TABS Take 1 tablet by mouth at bedtime as needed. For insomnia      . fish oil-omega-3 fatty acids 1000 MG capsule Take 2 g by mouth daily.      Marland Kitchen glucosamine-chondroitin 500-400 MG tablet Take 1 tablet by mouth daily.       No current facility-administered medications for this visit.    Physical Exam: BP 129/79  Pulse 94  Resp 16  Ht 5\' 4"  (1.626 m)  Wt 220 lb (99.791 kg)  BMI 37.74 kg/m2  SpO2 93% She looks well. Lung exam is clear. The left thoracotomy incision is healing well.  Diagnostic Tests:  *RADIOLOGY REPORT*   Clinical Data: History of renal cell carcinoma with wedge resection of left upper lobe metastatic lesion, follow   CHEST - 2 VIEW   Comparison: Of the chest x-ray of 05/16/2012   Findings: No pneumothorax is seen.  The small left pleural effusion noted previously has resolved.  Mediastinal contours are stable. Heart size is stable.    IMPRESSION: No active lung disease.  Resolution of small left effusion.     Original Report Authenticated By: Dwyane Dee, M.D.   Impression:  She is doing well following resection of this left upper lobe metastasis. She would like more pain medication to use at night to help her sleep and I wrote her for oxycodone IR 5 mg-10 mg every 6 hours when necessary for pain #60. I asked her not to drive while she is taking that medication but she may take ibuprofen or Tylenol during the day.  Plan:  I'll plan to see her back in 2 months for followup. She is going to be followed by Dr. Venda Rodes who is planning to repeat her CT scan of the chest in August.

## 2012-08-07 ENCOUNTER — Ambulatory Visit: Payer: Medicare Other | Admitting: Surgery

## 2012-08-14 ENCOUNTER — Encounter: Payer: Self-pay | Admitting: Surgery

## 2012-08-14 ENCOUNTER — Ambulatory Visit (INDEPENDENT_AMBULATORY_CARE_PROVIDER_SITE_OTHER): Payer: Medicare Other | Admitting: Surgery

## 2012-08-14 DIAGNOSIS — C3410 Malignant neoplasm of upper lobe, unspecified bronchus or lung: Secondary | ICD-10-CM

## 2012-08-14 DIAGNOSIS — Z09 Encounter for follow-up examination after completed treatment for conditions other than malignant neoplasm: Secondary | ICD-10-CM

## 2012-08-14 NOTE — Progress Notes (Signed)
       [address]       Amanda Crawford [address]             [phone]        HPI:  Patient returns for routine postoperative follow-up having undergone left thoracotomy with wedge resection of a left upper lobe lung nodule on 05/09/2012. The patient's early postoperative recovery while in the hospital was notable for an uncomplicated postoperative course. The final pathology showed this to be clear cell carcinoma consistent with a metastasis from the patient's prior renal cell cancer. Since I last saw her on 06/12/2012 the patient reports that she has been improving. She still has some left chest wall pain which is mostly beneath the left breast. During the day she has been fairly active.    Current Outpatient Prescriptions  Medication Sig Dispense Refill  . cholecalciferol (VITAMIN D) 1000 UNITS tablet Take 1,000 Units by mouth daily.      . colchicine 0.6 MG tablet Take 0.6 mg by mouth daily as needed. For gout      . diphenhydramine-acetaminophen (TYLENOL PM) 25-500 MG TABS Take 1 tablet by mouth at bedtime as needed. For insomnia      . fish oil-omega-3 fatty acids 1000 MG capsule Take 2 g by mouth daily.      Marland Kitchen glucosamine-chondroitin 500-400 MG tablet Take 1 tablet by mouth daily.       No current facility-administered medications for this visit.     Physical Exam: BP 129/77  Pulse 104  Resp 20  Ht 5\' 4"  (1.626 m)  Wt 220 lb (99.791 kg)  BMI 37.74 kg/m2  SpO2 94% She looks well The left thoracotomy incision is well-healed The lungs are clear.   Impression:  She continues to do well following resection of an isolated lung met.   Plan:  She will continue to follow-up with Dr. Venda Rodes. I will be happy to see her if the need arises.

## 2012-09-10 ENCOUNTER — Ambulatory Visit (HOSPITAL_COMMUNITY)
Admission: RE | Admit: 2012-09-10 | Discharge: 2012-09-10 | Disposition: A | Discharge status: Home / Self Care | Payer: Medicare Other | Source: Ambulatory Visit | Attending: Oncology | Admitting: Oncology

## 2012-09-10 ENCOUNTER — Other Ambulatory Visit (HOSPITAL_BASED_OUTPATIENT_CLINIC_OR_DEPARTMENT_OTHER): Payer: Medicare Other | Admitting: Lab

## 2012-09-10 DIAGNOSIS — K5730 Diverticulosis of large intestine without perforation or abscess without bleeding: Secondary | ICD-10-CM | POA: Insufficient documentation

## 2012-09-10 DIAGNOSIS — C649 Malignant neoplasm of unspecified kidney, except renal pelvis: Secondary | ICD-10-CM

## 2012-09-10 DIAGNOSIS — Z905 Acquired absence of kidney: Secondary | ICD-10-CM | POA: Insufficient documentation

## 2012-09-10 LAB — CBC WITH DIFFERENTIAL/PLATELET
Basophils Absolute: 0 10*3/uL (ref 0.0–0.1)
Eosinophils Absolute: 0.2 10*3/uL (ref 0.0–0.5)
HCT: 41.8 % (ref 34.8–46.6)
LYMPH%: 32 % (ref 14.0–49.7)
MONO#: 0.6 10*3/uL (ref 0.1–0.9)
NEUT#: 5.3 10*3/uL (ref 1.5–6.5)
NEUT%: 58.9 % (ref 38.4–76.8)
Platelets: 286 10*3/uL (ref 145–400)
WBC: 8.9 10*3/uL (ref 3.9–10.3)

## 2012-09-10 LAB — COMPREHENSIVE METABOLIC PANEL (CC13)
CO2: 24 mEq/L (ref 22–29)
Creatinine: 1.1 mg/dL (ref 0.6–1.1)
Glucose: 120 mg/dl (ref 70–140)
Total Bilirubin: 0.44 mg/dL (ref 0.20–1.20)

## 2012-09-10 MED ORDER — IOHEXOL 300 MG/ML  SOLN
100.0000 mL | Freq: Once | INTRAMUSCULAR | Status: AC | PRN
  Administered 2012-09-10: 100 mL via INTRAVENOUS

## 2012-09-12 ENCOUNTER — Ambulatory Visit (HOSPITAL_BASED_OUTPATIENT_CLINIC_OR_DEPARTMENT_OTHER): Payer: Medicare Other | Admitting: Oncology

## 2012-09-12 ENCOUNTER — Telehealth: Payer: Self-pay | Admitting: Oncology

## 2012-09-12 VITALS — BP 143/80 | HR 94 | Temp 99.0°F | Resp 20 | Ht 64.0 in | Wt 222.6 lb

## 2012-09-12 DIAGNOSIS — C649 Malignant neoplasm of unspecified kidney, except renal pelvis: Secondary | ICD-10-CM

## 2012-09-12 NOTE — Telephone Encounter (Signed)
gv adn printed appt sched and avs for pt °

## 2012-09-12 NOTE — Progress Notes (Signed)
Hematology and Oncology Follow Up Visit  Amanda Crawford [id number] [birth year] 67 y.o. 09/12/2012 10:28 AM  CC: Amanda Crawford, M.D.  Amanda Crawford, M.D.    Principle Diagnosis: This is a 67 year old female diagnosed with renal cell carcinoma, diagnosed in 2001, had a T2 disease and had recurrent disease in a lung nodule in 2008.  Prior Therapy: She is status post left nephrectomy in 2001. She developed a left lingular mass, status post VATS procedure.  Pathology revealed a recurrent renal cell carcinoma resected in October 2008.  Has been really clear of disease since that time. She under went a VATS and left upper lobe wedge resection of a tumor done on 05/09/2012.   Current therapy: Observation and surveillance.  Interim History: Amanda Crawford presents today for a followup visit. As mentioned she underwent left VATS and resection of the lung nodule in 04/2012. She did well without complications and recovering well now. She had not reported any neurological symptoms. Had not reported any peripheral neuropathy, not about any loss of bowel or bladder function.She did not report any constitutional symptoms, did not report any weight loss, did not report any difficulty breathing.  She continued to perform more activities of daily living without any major hindrance or decline. She report an episode of gout that has resolved now. She still has some residual pain and slight cough since her surgery.  No new issues noted and she is trying to lose weight.   Medications: I have reviewed the patient's current medications. Current Outpatient Prescriptions  Medication Sig Dispense Refill  . cholecalciferol (VITAMIN D) 1000 UNITS tablet Take 1,000 Units by mouth daily.      . colchicine 0.6 MG tablet Take 0.6 mg by mouth daily as needed. For gout      . diphenhydramine-acetaminophen (TYLENOL PM) 25-500 MG TABS Take 1 tablet by mouth at bedtime as needed. For insomnia      . fish oil-omega-3 fatty acids 1000  MG capsule Take 2 g by mouth daily.      Marland Kitchen glucosamine-chondroitin 500-400 MG tablet Take 1 tablet by mouth daily.       No current facility-administered medications for this visit.     Allergies: No Known Allergies  Past Medical History, Surgical history, Social history, and Family History were reviewed and updated.  Review of Systems: Constitutional:  Negative for fever, chills, night sweats, anorexia, weight loss, pain. Cardiovascular: no chest pain or dyspnea on exertion Respiratory: negative Neurological: negative Dermatological: negative ENT: negative Skin: Negative. Gastrointestinal: negative Genito-Urinary: negative Hematological and Lymphatic: negative Breast: negative Musculoskeletal: negative Remaining ROS negative. Physical Exam: Blood pressure 143/80, pulse 94, temperature 99 F (37.2 C), temperature source Oral, resp. rate 20, height 5\' 4"  (1.626 m), weight 222 lb 9.6 oz (100.971 kg). ECOG: 1 General appearance: alert Head: Normocephalic, without obvious abnormality, atraumatic Neck: no adenopathy, no carotid bruit, no JVD, supple, symmetrical, trachea midline and thyroid not enlarged, symmetric, no tenderness/mass/nodules Lymph nodes: Cervical, supraclavicular, and axillary nodes normal. Heart:regular rate and rhythm, S1, S2 normal, no murmur, click, rub or gallop Lung:chest clear, no wheezing, rales, normal symmetric air entry Abdomin: soft, non-tender, without masses or organomegaly EXT:no erythema, induration, or nodules   Lab Results: Lab Results  Component Value Date   WBC 8.9 09/10/2012   HGB 14.2 09/10/2012   HCT 41.8 09/10/2012   MCV 88.9 09/10/2012   PLT 286 09/10/2012     Chemistry      Component Value Date/Time   NA  141 09/10/2012 0852   NA 141 05/11/2012 0440   NA 137 07/05/2010 1117   K 4.7 09/10/2012 0852   K 4.2 05/11/2012 0440   K 4.9* 07/05/2010 1117   CL 103 05/11/2012 0440   CL 107 04/16/2012 0923   CL 96* 07/05/2010 1117   CO2 24 09/10/2012  0852   CO2 29 05/11/2012 0440   CO2 29 07/05/2010 1117   BUN 18.0 09/10/2012 0852   BUN 9 05/11/2012 0440   BUN 25* 07/05/2010 1117   CREATININE 1.1 09/10/2012 0852   CREATININE 0.91 05/11/2012 0440   CREATININE 1.4* 07/05/2010 1117      Component Value Date/Time   CALCIUM 10.4 09/10/2012 0852   CALCIUM 9.3 05/11/2012 0440   CALCIUM 9.9 07/05/2010 1117   ALKPHOS 69 09/10/2012 0852   ALKPHOS 52 05/11/2012 0440   ALKPHOS 65 07/05/2010 1117   AST 41* 09/10/2012 0852   AST 28 05/11/2012 0440   AST 34 07/05/2010 1117   ALT 56* 09/10/2012 0852   ALT 36* 05/11/2012 0440   ALT 44 07/05/2010 1117   BILITOT 0.44 09/10/2012 0852   BILITOT 0.7 05/11/2012 0440   BILITOT 1.00 07/05/2010 1117     CT CHEST, ABDOMEN AND PELVIS WITH CONTRAST  Technique: Multidetector CT imaging of the chest, abdomen and  pelvis was performed following the standard protocol during bolus  administration of intravenous contrast.  Contrast: OMNIPAQUE IOHEXOL 300 MG/ML SOLN CT 06/12/2012  Comparison: CT 04/16/2012  CT CHEST  Findings: There is no axillary or supraclavicular lymphadenopathy.  No Hilar lymphadenopathy. No pericardial fluid. The esophagus is  normal.  Review of the parenchyma demonstrates interval resection of the  upper lobe nodule. No new nodularity within the left or right  lung.  IMPRESSION:  1. No evidence of thoracic metastasis.  2. Interval resection of left upper lobe pulmonary nodule.  CT ABDOMEN AND PELVIS  Findings: The liver, spleen, pancreas, adrenal glands are normal.  Status post left nephrectomy. There is no nodularity in the  nephrectomy bed. No enhancing right renal lesion.  The stomach, small bowel, colon are normal. There are diverticula  the sigmoid colon.  Abdominal aorta normal caliber. No retroperitoneal periportal  lymphadenopathy  No free fluid the pelvis. The bladder is normal. No pelvic  lymphadenopathy.  IMPRESSION:  1. Stable left nephrectomy bed.  2. No evidence of metastasis in  the abdomen or pelvis.    Impression and Plan:   This is a pleasant 67 year-old female with the following issues: 1. Renal cell carcinoma.  She has stage IV NED disease with another lung nodule removed in 04/2012. CT scan from 09/10/2012  Was reviewed today and did not show any evidence of relapsed disease. At this time, I see no role for adjuvant treatment but she needs to close observation. I will repeat CT scans in 6 months and will use systemic therapy if she develops multiple lesions at that time.  2.     Back pain: resolved.  3      Follow up: in 3 months for clinical visit.    [facility], MD 8/14/201410:28 AM

## 2012-11-01 DIAGNOSIS — Z0279 Encounter for issue of other medical certificate: Secondary | ICD-10-CM

## 2012-11-06 ENCOUNTER — Other Ambulatory Visit (HOSPITAL_COMMUNITY): Payer: Self-pay | Admitting: Family Medicine

## 2012-11-06 DIAGNOSIS — Z1231 Encounter for screening mammogram for malignant neoplasm of breast: Secondary | ICD-10-CM

## 2012-11-20 ENCOUNTER — Ambulatory Visit (HOSPITAL_COMMUNITY)
Admission: RE | Admit: 2012-11-20 | Discharge: 2012-11-20 | Disposition: A | Discharge status: Home / Self Care | Payer: Medicare Other | Source: Ambulatory Visit | Attending: Family Medicine | Admitting: Family Medicine

## 2012-11-20 DIAGNOSIS — Z1231 Encounter for screening mammogram for malignant neoplasm of breast: Secondary | ICD-10-CM | POA: Insufficient documentation

## 2012-12-05 ENCOUNTER — Other Ambulatory Visit (HOSPITAL_BASED_OUTPATIENT_CLINIC_OR_DEPARTMENT_OTHER): Payer: Medicare Other | Admitting: Lab

## 2012-12-05 ENCOUNTER — Telehealth: Payer: Self-pay | Admitting: Oncology

## 2012-12-05 ENCOUNTER — Encounter (INDEPENDENT_AMBULATORY_CARE_PROVIDER_SITE_OTHER): Payer: Self-pay

## 2012-12-05 ENCOUNTER — Ambulatory Visit (HOSPITAL_BASED_OUTPATIENT_CLINIC_OR_DEPARTMENT_OTHER): Payer: Medicare Other | Admitting: Oncology

## 2012-12-05 VITALS — BP 135/76 | HR 79 | Temp 97.1°F | Resp 18 | Ht 64.0 in | Wt 224.9 lb

## 2012-12-05 DIAGNOSIS — C3490 Malignant neoplasm of unspecified part of unspecified bronchus or lung: Secondary | ICD-10-CM

## 2012-12-05 DIAGNOSIS — C649 Malignant neoplasm of unspecified kidney, except renal pelvis: Secondary | ICD-10-CM

## 2012-12-05 DIAGNOSIS — C3430 Malignant neoplasm of lower lobe, unspecified bronchus or lung: Secondary | ICD-10-CM

## 2012-12-05 DIAGNOSIS — R911 Solitary pulmonary nodule: Secondary | ICD-10-CM

## 2012-12-05 LAB — CBC WITH DIFFERENTIAL/PLATELET
Eosinophils Absolute: 0.2 10*3/uL (ref 0.0–0.5)
HCT: 41.3 % (ref 34.8–46.6)
LYMPH%: 29.2 % (ref 14.0–49.7)
MONO#: 0.7 10*3/uL (ref 0.1–0.9)
NEUT#: 6 10*3/uL (ref 1.5–6.5)
NEUT%: 60.5 % (ref 38.4–76.8)
Platelets: 275 10*3/uL (ref 145–400)
RBC: 4.6 10*6/uL (ref 3.70–5.45)
WBC: 9.9 10*3/uL (ref 3.9–10.3)
lymph#: 2.9 10*3/uL (ref 0.9–3.3)

## 2012-12-05 LAB — COMPREHENSIVE METABOLIC PANEL (CC13)
Albumin: 3.7 g/dL (ref 3.5–5.0)
Anion Gap: 12 mEq/L — ABNORMAL HIGH (ref 3–11)
CO2: 23 mEq/L (ref 22–29)
Calcium: 10.4 mg/dL (ref 8.4–10.4)
Chloride: 106 mEq/L (ref 98–109)
Glucose: 119 mg/dl (ref 70–140)
Sodium: 141 mEq/L (ref 136–145)
Total Bilirubin: 0.5 mg/dL (ref 0.20–1.20)
Total Protein: 7.3 g/dL (ref 6.4–8.3)

## 2012-12-05 NOTE — Progress Notes (Signed)
Hematology and Oncology Follow Up Visit  JACQUELINA HEWINS [id number] 02-26-1945 67 y.o. 12/05/2012 8:43 AM  CC: Ines Bloomer, M.D.  Anna Genre Little, M.D.    Principle Diagnosis: This is a 67 year old female diagnosed with renal cell carcinoma, diagnosed in 2001, had a T2 disease and had recurrent disease in a lung nodule in 2008.  Prior Therapy: She is status post left nephrectomy in 2001. She developed a left lingular mass, status post VATS procedure.  Pathology revealed a recurrent renal cell carcinoma resected in October 2008.  Has been really clear of disease since that time. She under went a VATS and left upper lobe wedge resection of a tumor done on 05/09/2012.   Current therapy: Observation and surveillance.  Interim History: Mrs. Gong presents today for a followup visit. As mentioned she underwent left VATS and resection of the lung nodule in 04/2012. She did well without complications and recovering well now..She did not report any constitutional symptoms, did not report any weight loss, did not report any difficulty breathing.  She continued to perform more activities of daily living without any major hindrance or decline. She report an episode of gout that has resolved now. She still has some residual pain and slight cough since her surgery. She has not reported any respiratory symptoms or difficulty breathing since her last visit.   Medications: I have reviewed the patient's current medications. Current Outpatient Prescriptions  Medication Sig Dispense Refill  . cholecalciferol (VITAMIN D) 1000 UNITS tablet Take 1,000 Units by mouth daily.      . colchicine 0.6 MG tablet Take 0.6 mg by mouth daily as needed. For gout      . diphenhydramine-acetaminophen (TYLENOL PM) 25-500 MG TABS Take 1 tablet by mouth at bedtime as needed. For insomnia      . fish oil-omega-3 fatty acids 1000 MG capsule Take 2 g by mouth daily.      Marland Kitchen glucosamine-chondroitin 500-400 MG tablet Take 1 tablet by  mouth daily.       No current facility-administered medications for this visit.     Allergies: No Known Allergies  Past Medical History, Surgical history, Social history, and Family History were reviewed and updated.  Review of Systems: Remaining ROS negative. Physical Exam: Blood pressure 135/76, pulse 79, temperature 97.1 F (36.2 C), temperature source Oral, resp. rate 18, height 5\' 4"  (1.626 m), weight 224 lb 14.4 oz (102.014 kg), SpO2 95.00%. ECOG: 1 General appearance: alert Head: Normocephalic, without obvious abnormality, atraumatic Neck: no adenopathy, no carotid bruit, no JVD, supple, symmetrical, trachea midline and thyroid not enlarged, symmetric, no tenderness/mass/nodules Lymph nodes: Cervical, supraclavicular, and axillary nodes normal. Heart:regular rate and rhythm, S1, S2 normal, no murmur, click, rub or gallop Lung:chest clear, no wheezing, rales, normal symmetric air entry Abdomin: soft, non-tender, without masses or organomegaly EXT:no erythema, induration, or nodules   Lab Results: Lab Results  Component Value Date   WBC 9.9 12/05/2012   HGB 13.8 12/05/2012   HCT 41.3 12/05/2012   MCV 89.9 12/05/2012   PLT 275 12/05/2012     Chemistry      Component Value Date/Time   NA 141 09/10/2012 0852   NA 141 05/11/2012 0440   NA 137 07/05/2010 1117   K 4.7 09/10/2012 0852   K 4.2 05/11/2012 0440   K 4.9* 07/05/2010 1117   CL 103 05/11/2012 0440   CL 107 04/16/2012 0923   CL 96* 07/05/2010 1117   CO2 24 09/10/2012 0852   CO2  29 05/11/2012 0440   CO2 29 07/05/2010 1117   BUN 18.0 09/10/2012 0852   BUN 9 05/11/2012 0440   BUN 25* 07/05/2010 1117   CREATININE 1.1 09/10/2012 0852   CREATININE 0.91 05/11/2012 0440   CREATININE 1.4* 07/05/2010 1117      Component Value Date/Time   CALCIUM 10.4 09/10/2012 0852   CALCIUM 9.3 05/11/2012 0440   CALCIUM 9.9 07/05/2010 1117   ALKPHOS 69 09/10/2012 0852   ALKPHOS 52 05/11/2012 0440   ALKPHOS 65 07/05/2010 1117   AST 41* 09/10/2012 0852   AST  28 05/11/2012 0440   AST 34 07/05/2010 1117   ALT 56* 09/10/2012 0852   ALT 36* 05/11/2012 0440   ALT 44 07/05/2010 1117   BILITOT 0.44 09/10/2012 0852   BILITOT 0.7 05/11/2012 0440   BILITOT 1.00 07/05/2010 1117      Impression and Plan:   This is a pleasant 67 year-old female with the following issues: 1. Renal cell carcinoma.  She has stage IV NED disease with another lung nodule removed in 04/2012. CT scan from 09/10/2012 did not show any evidence of relapsed disease. At this time, I see no role for adjuvant treatment but she needs to close observation. I will repeat CT scans in 3 months and will use systemic therapy if she develops multiple lesions at that time.  2.     Back pain: resolved.  3      Follow up: in 3 months for clinical visit and CT scan.    [facility], MD 11/6/20148:43 AM

## 2012-12-05 NOTE — Telephone Encounter (Signed)
gv and printed appt sched and avs for pt....gv pt barium   °

## 2013-03-11 ENCOUNTER — Other Ambulatory Visit (HOSPITAL_BASED_OUTPATIENT_CLINIC_OR_DEPARTMENT_OTHER): Payer: Medicare Other

## 2013-03-11 ENCOUNTER — Other Ambulatory Visit: Payer: Self-pay | Admitting: Oncology

## 2013-03-11 ENCOUNTER — Encounter (HOSPITAL_COMMUNITY): Payer: Self-pay

## 2013-03-11 ENCOUNTER — Ambulatory Visit (HOSPITAL_COMMUNITY)
Admission: RE | Admit: 2013-03-11 | Discharge: 2013-03-11 | Disposition: A | Discharge status: Home / Self Care | Payer: Medicare Other | Source: Ambulatory Visit | Attending: Oncology | Admitting: Oncology

## 2013-03-11 DIAGNOSIS — C649 Malignant neoplasm of unspecified kidney, except renal pelvis: Secondary | ICD-10-CM

## 2013-03-11 DIAGNOSIS — C3490 Malignant neoplasm of unspecified part of unspecified bronchus or lung: Secondary | ICD-10-CM

## 2013-03-11 DIAGNOSIS — C7800 Secondary malignant neoplasm of unspecified lung: Secondary | ICD-10-CM | POA: Insufficient documentation

## 2013-03-11 DIAGNOSIS — I2510 Atherosclerotic heart disease of native coronary artery without angina pectoris: Secondary | ICD-10-CM | POA: Insufficient documentation

## 2013-03-11 DIAGNOSIS — Z9089 Acquired absence of other organs: Secondary | ICD-10-CM | POA: Insufficient documentation

## 2013-03-11 DIAGNOSIS — C3430 Malignant neoplasm of lower lobe, unspecified bronchus or lung: Secondary | ICD-10-CM

## 2013-03-11 DIAGNOSIS — K5730 Diverticulosis of large intestine without perforation or abscess without bleeding: Secondary | ICD-10-CM | POA: Insufficient documentation

## 2013-03-11 DIAGNOSIS — Z90710 Acquired absence of both cervix and uterus: Secondary | ICD-10-CM | POA: Insufficient documentation

## 2013-03-11 DIAGNOSIS — Z905 Acquired absence of kidney: Secondary | ICD-10-CM | POA: Insufficient documentation

## 2013-03-11 DIAGNOSIS — I700 Atherosclerosis of aorta: Secondary | ICD-10-CM | POA: Insufficient documentation

## 2013-03-11 LAB — CBC WITH DIFFERENTIAL/PLATELET
BASO%: 0.2 % (ref 0.0–2.0)
Basophils Absolute: 0 10*3/uL (ref 0.0–0.1)
EOS ABS: 0.2 10*3/uL (ref 0.0–0.5)
EOS%: 2.7 % (ref 0.0–7.0)
HCT: 43.6 % (ref 34.8–46.6)
HGB: 14.1 g/dL (ref 11.6–15.9)
LYMPH%: 30.6 % (ref 14.0–49.7)
MCH: 29.9 pg (ref 25.1–34.0)
MCHC: 32.3 g/dL (ref 31.5–36.0)
MCV: 92.6 fL (ref 79.5–101.0)
MONO#: 0.5 10*3/uL (ref 0.1–0.9)
MONO%: 5.4 % (ref 0.0–14.0)
NEUT%: 61.1 % (ref 38.4–76.8)
NEUTROS ABS: 5.5 10*3/uL (ref 1.5–6.5)
Platelets: 293 10*3/uL (ref 145–400)
RBC: 4.71 10*6/uL (ref 3.70–5.45)
RDW: 13.3 % (ref 11.2–14.5)
WBC: 9 10*3/uL (ref 3.9–10.3)
lymph#: 2.8 10*3/uL (ref 0.9–3.3)

## 2013-03-11 LAB — COMPREHENSIVE METABOLIC PANEL (CC13)
ALBUMIN: 4 g/dL (ref 3.5–5.0)
ALK PHOS: 74 U/L (ref 40–150)
ALT: 80 U/L — AB (ref 0–55)
AST: 64 U/L — ABNORMAL HIGH (ref 5–34)
Anion Gap: 10 mEq/L (ref 3–11)
BUN: 13.7 mg/dL (ref 7.0–26.0)
CO2: 25 mEq/L (ref 22–29)
Calcium: 10.1 mg/dL (ref 8.4–10.4)
Chloride: 106 mEq/L (ref 98–109)
Creatinine: 1.2 mg/dL — ABNORMAL HIGH (ref 0.6–1.1)
GLUCOSE: 122 mg/dL (ref 70–140)
POTASSIUM: 4.3 meq/L (ref 3.5–5.1)
Sodium: 141 mEq/L (ref 136–145)
TOTAL PROTEIN: 7.6 g/dL (ref 6.4–8.3)
Total Bilirubin: 0.54 mg/dL (ref 0.20–1.20)

## 2013-03-11 MED ORDER — IOHEXOL 300 MG/ML  SOLN
100.0000 mL | Freq: Once | INTRAMUSCULAR | Status: AC | PRN
  Administered 2013-03-11: 100 mL via INTRAVENOUS

## 2013-03-13 ENCOUNTER — Ambulatory Visit (HOSPITAL_BASED_OUTPATIENT_CLINIC_OR_DEPARTMENT_OTHER): Payer: Medicare Other | Admitting: Oncology

## 2013-03-13 ENCOUNTER — Telehealth: Payer: Self-pay | Admitting: Oncology

## 2013-03-13 ENCOUNTER — Encounter: Payer: Self-pay | Admitting: Oncology

## 2013-03-13 VITALS — BP 159/83 | HR 70 | Temp 97.5°F | Resp 18 | Wt 227.4 lb

## 2013-03-13 DIAGNOSIS — C649 Malignant neoplasm of unspecified kidney, except renal pelvis: Secondary | ICD-10-CM

## 2013-03-13 DIAGNOSIS — C3490 Malignant neoplasm of unspecified part of unspecified bronchus or lung: Secondary | ICD-10-CM

## 2013-03-13 DIAGNOSIS — C7800 Secondary malignant neoplasm of unspecified lung: Secondary | ICD-10-CM

## 2013-03-13 NOTE — Progress Notes (Signed)
Hematology and Oncology Follow Up Visit  Amanda Crawford [id number] 05-26-1945 68 y.o. 03/13/2013 8:53 AM  CC: Nicanor Alcon, M.D.  Priscille Heidelberg Little, M.D.    Principle Diagnosis: This is a 68 year old female diagnosed with renal cell carcinoma, diagnosed in 2001, had a T2 disease and had recurrent disease in a lung nodule in 2008.  Prior Therapy: She is status post left nephrectomy in 2001. She developed a left lingular mass, status post VATS procedure.  Pathology revealed a recurrent renal cell carcinoma resected in October 2008.  Has been really clear of disease since that time. She under went a VATS and left upper lobe wedge resection of a tumor done on 05/09/2012.   Current therapy: Observation and surveillance.  Interim History: [name] presents today for a followup visit. As mentioned she underwent left VATS and resection of the lung nodule in 04/2012. She did well without complications and recovered at this time.She did not report any constitutional symptoms, did not report any weight loss, did not report any difficulty breathing.  She continued to perform more activities of daily living without any major hindrance or decline. She report an episode of gout that has resolved now. She still has some residual pain and slight cough since her surgery. She has not reported any respiratory symptoms or difficulty breathing since her last visit. She has not reported any GI symptoms including abdominal pain or diarrhea. She has not reported any fever or hematochezia.   Medications: I have reviewed the patient's current medications. Current Outpatient Prescriptions  Medication Sig Dispense Refill  . cholecalciferol (VITAMIN D) 1000 UNITS tablet Take 1,000 Units by mouth daily.      . colchicine 0.6 MG tablet Take 0.6 mg by mouth daily as needed. For gout      . diphenhydramine-acetaminophen (TYLENOL PM) 25-500 MG TABS Take 1 tablet by mouth at bedtime as needed. For insomnia      . fish  oil-omega-3 fatty acids 1000 MG capsule Take 2 g by mouth daily.      Marland Kitchen glucosamine-chondroitin 500-400 MG tablet Take 1 tablet by mouth daily.       No current facility-administered medications for this visit.     Allergies: No Known Allergies  Past Medical History, Surgical history, Social history, and Family History were reviewed and updated.  Review of Systems: Remaining ROS negative. Physical Exam: Blood pressure 159/83, pulse 70, temperature 97.5 F (36.4 C), temperature source Oral, resp. rate 18, weight 227 lb 7 oz (103.165 kg). ECOG: 1 General appearance: alert Head: Normocephalic, without obvious abnormality, atraumatic Neck: no adenopathy, no carotid bruit, no JVD, supple, symmetrical, trachea midline and thyroid not enlarged, symmetric, no tenderness/mass/nodules Lymph nodes: Cervical, supraclavicular, and axillary nodes normal. Heart:regular rate and rhythm, S1, S2 normal, no murmur, click, rub or gallop Lung:chest clear, no wheezing, rales, normal symmetric air entry Abdomin: soft, non-tender, without masses or organomegaly EXT:no erythema, induration, or nodules   Lab Results: Lab Results  Component Value Date   WBC 9.0 03/11/2013   HGB 14.1 03/11/2013   HCT 43.6 03/11/2013   MCV 92.6 03/11/2013   PLT 293 03/11/2013     Chemistry      Component Value Date/Time   NA 141 03/11/2013 0802   NA 141 05/11/2012 0440   NA 137 07/05/2010 1117   K 4.3 03/11/2013 0802   K 4.2 05/11/2012 0440   K 4.9* 07/05/2010 1117   CL 103 05/11/2012 0440   CL 107 04/16/2012 3151  CL 96* 07/05/2010 1117   CO2 25 03/11/2013 0802   CO2 29 05/11/2012 0440   CO2 29 07/05/2010 1117   BUN 13.7 03/11/2013 0802   BUN 9 05/11/2012 0440   BUN 25* 07/05/2010 1117   CREATININE 1.2* 03/11/2013 0802   CREATININE 0.91 05/11/2012 0440   CREATININE 1.4* 07/05/2010 1117      Component Value Date/Time   CALCIUM 10.1 03/11/2013 0802   CALCIUM 9.3 05/11/2012 0440   CALCIUM 9.9 07/05/2010 1117   ALKPHOS 74 03/11/2013  0802   ALKPHOS 52 05/11/2012 0440   ALKPHOS 65 07/05/2010 1117   AST 64* 03/11/2013 0802   AST 28 05/11/2012 0440   AST 34 07/05/2010 1117   ALT 80* 03/11/2013 0802   ALT 36* 05/11/2012 0440   ALT 44 07/05/2010 1117   BILITOT 0.54 03/11/2013 0802   BILITOT 0.7 05/11/2012 0440   BILITOT 1.00 07/05/2010 1117     TECHNIQUE:  Multidetector CT imaging of the chest, abdomen and pelvis was  performed following the standard protocol during bolus  administration of intravenous contrast.  CONTRAST: 176mL OMNIPAQUE IOHEXOL 300 MG/ML SOLN  COMPARISON: CT of the chest, abdomen and pelvis 09/10/2012.  FINDINGS:  CT CHEST FINDINGS  Mediastinum: Heart size is normal. There is atherosclerosis of the  thoracic aorta, the great vessels of the mediastinum and the  coronary arteries, including calcified atherosclerotic plaque in the  left anterior descending coronary artery. No pathologically enlarged  mediastinal or hilar lymph nodes. Esophagus is unremarkable in  appearance.  Lungs/Pleura: Postoperative changes of wedge resection are noted in  the medial aspect of the left upper lobe. No suspicious appearing  pulmonary nodules or masses are identified. No acute consolidative  airspace disease. No pleural effusions.  Musculoskeletal: Postthoracotomy changes in the left ribs. There are  no aggressive appearing lytic or blastic lesions noted in the  visualized portions of the skeleton.  CT ABDOMEN AND PELVIS FINDINGS  Abdomen/Pelvis: Status post cholecystectomy. The appearance of the  liver, pancreas, spleen, bilateral adrenal glands and the right  kidney is unremarkable. Status post left nephrectomy.  There are a few colonic diverticulae, most pronounced in the region  of the sigmoid colon, without surrounding inflammatory changes to  suggest an acute diverticulitis at this time. No significant volume  of ascites. No pneumoperitoneum. No pathologic distention of small  bowel. No definite lymphadenopathy  identified within the abdomen or  pelvis. Status post hysterectomy. Ovaries are not confidently  identified may be surgically absent or atrophic. Urinary bladder is  nearly completely decompressed, but otherwise unremarkable in  appearance.  Musculoskeletal: There are no aggressive appearing lytic or blastic  lesions noted in the visualized portions of the skeleton.  IMPRESSION:  1. Status post left nephrectomy and left upper lobe wedge resection  without evidence to suggest new metastatic disease in the chest,  abdomen or pelvis on today's examination.  2. Colonic diverticulosis without evidence to suggest acute  diverticulitis at this time.  3. Atherosclerosis, including left anterior descending coronary  artery disease. Please note that although the presence of coronary  artery calcium documents the presence of coronary artery disease,  the severity of this disease and any potential stenosis cannot be  assessed on this non-gated CT examination. Assessment for potential  risk factor modification, dietary therapy or pharmacologic therapy  may be warranted, if clinically indicated.  4. Additional incidental findings, as above.  Impression and Plan:   This is a pleasant 68 year-old female with the following  issues: 1. Renal cell carcinoma.  She has stage IV NED disease with another lung nodule removed in 04/2012. CT scan from 2/102015 was reviewed today did not show any evidence of relapsed disease. At this time, I see no role for adjuvant treatment but she needs to close observation. I will repeat CT scans in 6 months and will use systemic therapy if she develops multiple lesions at that time.  2.     Diverticulosis: She has no evidence of diverticulitis and she is scheduled to have a colonoscopy this year. 3      Follow up: in 6 months for clinical visit and CT scan.    [facility], MD 2/12/20158:53 AM

## 2013-03-13 NOTE — Telephone Encounter (Signed)
gv adn printed appt scehd and avs forpt for Sept...gv pt barium

## 2013-09-23 LAB — HM COLONOSCOPY

## 2013-10-07 ENCOUNTER — Ambulatory Visit (HOSPITAL_COMMUNITY)
Admission: RE | Admit: 2013-10-07 | Discharge: 2013-10-07 | Disposition: A | Discharge status: Home / Self Care | Payer: Medicare Other | Source: Ambulatory Visit | Attending: Oncology | Admitting: Oncology

## 2013-10-07 ENCOUNTER — Other Ambulatory Visit (HOSPITAL_BASED_OUTPATIENT_CLINIC_OR_DEPARTMENT_OTHER): Payer: Medicare Other

## 2013-10-07 ENCOUNTER — Other Ambulatory Visit: Payer: Self-pay | Admitting: Oncology

## 2013-10-07 ENCOUNTER — Encounter (HOSPITAL_COMMUNITY): Payer: Self-pay

## 2013-10-07 DIAGNOSIS — C649 Malignant neoplasm of unspecified kidney, except renal pelvis: Secondary | ICD-10-CM | POA: Diagnosis not present

## 2013-10-07 DIAGNOSIS — Z905 Acquired absence of kidney: Secondary | ICD-10-CM | POA: Insufficient documentation

## 2013-10-07 DIAGNOSIS — C3490 Malignant neoplasm of unspecified part of unspecified bronchus or lung: Secondary | ICD-10-CM | POA: Insufficient documentation

## 2013-10-07 DIAGNOSIS — I2510 Atherosclerotic heart disease of native coronary artery without angina pectoris: Secondary | ICD-10-CM | POA: Diagnosis not present

## 2013-10-07 DIAGNOSIS — C7800 Secondary malignant neoplasm of unspecified lung: Secondary | ICD-10-CM

## 2013-10-07 DIAGNOSIS — K5730 Diverticulosis of large intestine without perforation or abscess without bleeding: Secondary | ICD-10-CM | POA: Diagnosis not present

## 2013-10-07 LAB — COMPREHENSIVE METABOLIC PANEL (CC13)
ALK PHOS: 70 U/L (ref 40–150)
ALT: 64 U/L — ABNORMAL HIGH (ref 0–55)
AST: 51 U/L — AB (ref 5–34)
Albumin: 4 g/dL (ref 3.5–5.0)
Anion Gap: 11 mEq/L (ref 3–11)
BUN: 26.3 mg/dL — AB (ref 7.0–26.0)
CO2: 23 mEq/L (ref 22–29)
CREATININE: 1.2 mg/dL — AB (ref 0.6–1.1)
Calcium: 10.4 mg/dL (ref 8.4–10.4)
Chloride: 108 mEq/L (ref 98–109)
GLUCOSE: 136 mg/dL (ref 70–140)
Potassium: 4.5 mEq/L (ref 3.5–5.1)
Sodium: 142 mEq/L (ref 136–145)
Total Bilirubin: 0.34 mg/dL (ref 0.20–1.20)
Total Protein: 8.2 g/dL (ref 6.4–8.3)

## 2013-10-07 LAB — CBC WITH DIFFERENTIAL/PLATELET
BASO%: 0.8 % (ref 0.0–2.0)
BASOS ABS: 0.1 10*3/uL (ref 0.0–0.1)
EOS%: 2.6 % (ref 0.0–7.0)
Eosinophils Absolute: 0.2 10*3/uL (ref 0.0–0.5)
HCT: 45.4 % (ref 34.8–46.6)
HEMOGLOBIN: 14.9 g/dL (ref 11.6–15.9)
LYMPH%: 31.4 % (ref 14.0–49.7)
MCH: 30.2 pg (ref 25.1–34.0)
MCHC: 32.9 g/dL (ref 31.5–36.0)
MCV: 91.8 fL (ref 79.5–101.0)
MONO#: 0.5 10*3/uL (ref 0.1–0.9)
MONO%: 5.7 % (ref 0.0–14.0)
NEUT%: 59.5 % (ref 38.4–76.8)
NEUTROS ABS: 5.2 10*3/uL (ref 1.5–6.5)
PLATELETS: 276 10*3/uL (ref 145–400)
RBC: 4.94 10*6/uL (ref 3.70–5.45)
RDW: 13.6 % (ref 11.2–14.5)
WBC: 8.7 10*3/uL (ref 3.9–10.3)
lymph#: 2.7 10*3/uL (ref 0.9–3.3)

## 2013-10-07 MED ORDER — IOHEXOL 300 MG/ML  SOLN
100.0000 mL | Freq: Once | INTRAMUSCULAR | Status: AC | PRN
  Administered 2013-10-07: 100 mL via INTRAVENOUS

## 2013-10-09 ENCOUNTER — Encounter: Payer: Self-pay | Admitting: Oncology

## 2013-10-09 ENCOUNTER — Ambulatory Visit (HOSPITAL_BASED_OUTPATIENT_CLINIC_OR_DEPARTMENT_OTHER): Payer: Medicare Other | Admitting: Oncology

## 2013-10-09 ENCOUNTER — Telehealth: Payer: Self-pay | Admitting: Oncology

## 2013-10-09 VITALS — BP 120/54 | HR 85 | Temp 97.9°F | Resp 20 | Ht 64.0 in | Wt 222.5 lb

## 2013-10-09 DIAGNOSIS — K5730 Diverticulosis of large intestine without perforation or abscess without bleeding: Secondary | ICD-10-CM

## 2013-10-09 DIAGNOSIS — C649 Malignant neoplasm of unspecified kidney, except renal pelvis: Secondary | ICD-10-CM

## 2013-10-09 NOTE — Progress Notes (Signed)
Hematology and Oncology Follow Up Visit  Amanda Crawford [id number] 1945-07-05 68 y.o. 10/09/2013 9:02 AM  CC: Amanda Crawford, M.D.  Amanda Crawford, M.D.    Principle Diagnosis: This is a 68 year old female diagnosed with renal cell carcinoma, diagnosed in 2001, had a T2 disease and had recurrent disease in a lung nodule in 2008.  Prior Therapy: She is status post left nephrectomy in 2001. She developed a left lingular mass, status post VATS procedure.  Pathology revealed a recurrent renal cell carcinoma resected in October 2008.  Has been really clear of disease since that time. She under went a VATS and left upper lobe wedge resection of a tumor done on 05/09/2012.   Current therapy: Observation and surveillance.  Interim History: Amanda Crawford presents today for a followup visit by her self. Since the last visit, she has been doing well. She continues to have occasional exertional dyspnea but no chest pain or cough.She did not report any constitutional symptoms, did not report any weight loss.  She continued to perform more activities of daily living without any major hindrance or decline. She still has some residual pain and slight cough since her surgery. She has not reported any GI symptoms including abdominal pain or diarrhea. She has not reported any fever or hematochezia. She does not report any frequency urgency or hesitancy. She does not report any skeletal complaints. Does not report any lymphadenopathy or petechiae. Rest of the review of systems unremarkable.   Medications: I have reviewed the patient's current medications. Current Outpatient Prescriptions  Medication Sig Dispense Refill  . Calcium Carbonate (CALTRATE 600) 1500 MG TABS Take by mouth.      . fluticasone (FLONASE) 50 MCG/ACT nasal spray Place 1 spray into the nose.      . Biotin 1 MG CAPS Take by mouth.      . cholecalciferol (VITAMIN D) 1000 UNITS tablet Take 1,000 Units by mouth daily.      . colchicine 0.6 MG  tablet Take 0.6 mg by mouth daily as needed. For gout      . diphenhydramine-acetaminophen (TYLENOL PM) 25-500 MG TABS Take 1 tablet by mouth at bedtime as needed. For insomnia      . fish oil-omega-3 fatty acids 1000 MG capsule Take 2 g by mouth daily.      Marland Kitchen glucosamine-chondroitin 500-400 MG tablet Take 1 tablet by mouth daily.       No current facility-administered medications for this visit.     Allergies: No Known Allergies  Past Medical History, Surgical history, Social history, and Family History were reviewed and updated.   Physical Exam: Blood pressure 120/54, pulse 85, temperature 97.9 F (36.6 C), resp. rate 20, height 5\' 4"  (1.626 m), weight 222 lb 8 oz (100.925 kg), SpO2 95.00%. ECOG: 1 General appearance: alert Head: Normocephalic, without obvious abnormality Neck: no adenopathy Lymph nodes: Cervical, supraclavicular, and axillary nodes normal. Heart:regular rate and rhythm, S1, S2 normal, no murmur, click, rub or gallop Lung:chest clear, no wheezing, rales, normal symmetric air entry. No dullness to percussion. Abdomin: soft, non-tender, without masses or organomegaly EXT:no erythema, induration, or nodules Skin: No rashes or lesions.  Lab Results: Lab Results  Component Value Date   WBC 8.7 10/07/2013   HGB 14.9 10/07/2013   HCT 45.4 10/07/2013   MCV 91.8 10/07/2013   PLT 276 10/07/2013     Chemistry      Component Value Date/Time   NA 142 10/07/2013 0907   NA 141 05/11/2012  0440   NA 137 07/05/2010 1117   K 4.5 10/07/2013 0907   K 4.2 05/11/2012 0440   K 4.9* 07/05/2010 1117   CL 103 05/11/2012 0440   CL 107 04/16/2012 0923   CL 96* 07/05/2010 1117   CO2 23 10/07/2013 0907   CO2 29 05/11/2012 0440   CO2 29 07/05/2010 1117   BUN 26.3* 10/07/2013 0907   BUN 9 05/11/2012 0440   BUN 25* 07/05/2010 1117   CREATININE 1.2* 10/07/2013 0907   CREATININE 0.91 05/11/2012 0440   CREATININE 1.4* 07/05/2010 1117      Component Value Date/Time   CALCIUM 10.4 10/07/2013 0907   CALCIUM 9.3  05/11/2012 0440   CALCIUM 9.9 07/05/2010 1117   ALKPHOS 70 10/07/2013 0907   ALKPHOS 52 05/11/2012 0440   ALKPHOS 65 07/05/2010 1117   AST 51* 10/07/2013 0907   AST 28 05/11/2012 0440   AST 34 07/05/2010 1117   ALT 64* 10/07/2013 0907   ALT 36* 05/11/2012 0440   ALT 44 07/05/2010 1117   BILITOT 0.34 10/07/2013 0907   BILITOT 0.7 05/11/2012 0440   BILITOT 1.00 07/05/2010 1117      EXAM:  CT CHEST WITH CONTRAST  CT ABDOMEN AND PELVIS WITH AND WITHOUT CONTRAST  TECHNIQUE:  Multidetector CT imaging of the chest was performed during  intravenous contrast administration. Multidetector CT imaging of the  abdomen and pelvis was performed following the standard protocol  before and during bolus administration of intravenous contrast.  CONTRAST: 169mL OMNIPAQUE IOHEXOL 300 MG/ML SOLN  COMPARISON: 03/11/2013  FINDINGS:  CT CHEST FINDINGS  There is no pleural effusion identified. Postoperative changes of  wedge resection are noted in the medial aspect of the left upper  lobe. Subpleural reticulation is identified within the lung bases  bilaterally. There is no suspicious pulmonary nodule or mass  identified. No airspace consolidation identified.  The heart size appears normal. Atherosclerotic disease involves the  thoracic aorta as well as the LAD coronary artery. No enlarged  mediastinal or hilar lymph nodes identified. No enlarged axillary or  supraclavicular adenopathy.  CT ABDOMEN AND PELVIS FINDINGS  There is no focal liver abnormality. Stable low-attenuation  structure within segment 7 of the liver measuring 5 mm, image  59/series 6. Prior cholecystectomy. No biliary dilatation. Normal  appearance of the pancreas. The spleen is unremarkable.  The adrenal glands are both normal. Unremarkable appearance of the  right kidney. Status post left nephrectomy. No specific features  identified to suggest local tumor recurrence. The urinary bladder  appears normal. There is a normal caliber of the abdominal  aorta.  Calcified atherosclerotic disease noted. No enlarged retroperitoneal  lymph nodes. No mesenteric adenopathy. There is no pelvic or  inguinal adenopathy identified. No free fluid or fluid collections  within the abdomen or pelvis.  The stomach is normal. The small bowel loops have a normal course  and caliber without obstruction. Normal appearance of the proximal  colon. There is multiple distal colonic diverticula without acute  inflammation.  Review of the visualized bony structures is significant for multi  level degenerative disc disease within the lumbar spine.  IMPRESSION:  1. Status post left nephrectomy and left upper lobe wedge resection.  No evidence to suggest new metastatic disease in the chest abdomen  or pelvis.  2. Colonic diverticulosis without acute inflammation.  3. Atherosclerotic disease including coronary artery calcification.     Impression and Plan:   This is a pleasant 69 year-old female with the following  issues: 1. Renal cell carcinoma.  She has stage IV NED disease with another lung nodule removed in 04/2012. CT scan from 10/07/2013 was reviewed today did not show any evidence of relapsed disease. At this time, I see no role for systemic treatment but she needs to close observation. I will repeat CT scans in 6 months and will use systemic therapy if she develops multiple lesions at that time.  2.     Diverticulosis: She has no evidence of diverticulitis and she had a colonoscopy recently without any complications. 3      Follow up: in 6 months for clinical visit and CT scan.    [facility], MD 9/10/20159:02 AM

## 2013-10-09 NOTE — Telephone Encounter (Signed)
Pt confirmed labs/ov per 09/10 POF, gave pt Barium and AVS....KJ

## 2014-04-14 ENCOUNTER — Other Ambulatory Visit (HOSPITAL_BASED_OUTPATIENT_CLINIC_OR_DEPARTMENT_OTHER): Payer: Medicare Other

## 2014-04-14 ENCOUNTER — Encounter (HOSPITAL_COMMUNITY): Payer: Self-pay

## 2014-04-14 ENCOUNTER — Ambulatory Visit (HOSPITAL_COMMUNITY)
Admission: RE | Admit: 2014-04-14 | Discharge: 2014-04-14 | Disposition: A | Discharge status: Home / Self Care | Payer: Medicare Other | Source: Ambulatory Visit | Attending: Oncology | Admitting: Oncology

## 2014-04-14 DIAGNOSIS — C649 Malignant neoplasm of unspecified kidney, except renal pelvis: Secondary | ICD-10-CM | POA: Insufficient documentation

## 2014-04-14 DIAGNOSIS — Z9889 Other specified postprocedural states: Secondary | ICD-10-CM | POA: Diagnosis not present

## 2014-04-14 LAB — CBC WITH DIFFERENTIAL/PLATELET
BASO%: 0.5 % (ref 0.0–2.0)
Basophils Absolute: 0 10*3/uL (ref 0.0–0.1)
EOS ABS: 0.3 10*3/uL (ref 0.0–0.5)
EOS%: 3.1 % (ref 0.0–7.0)
HCT: 45 % (ref 34.8–46.6)
HGB: 14.7 g/dL (ref 11.6–15.9)
LYMPH%: 39.1 % (ref 14.0–49.7)
MCH: 30.7 pg (ref 25.1–34.0)
MCHC: 32.7 g/dL (ref 31.5–36.0)
MCV: 93.9 fL (ref 79.5–101.0)
MONO#: 0.6 10*3/uL (ref 0.1–0.9)
MONO%: 7.2 % (ref 0.0–14.0)
NEUT%: 50.1 % (ref 38.4–76.8)
NEUTROS ABS: 4.2 10*3/uL (ref 1.5–6.5)
Platelets: 269 10*3/uL (ref 145–400)
RBC: 4.79 10*6/uL (ref 3.70–5.45)
RDW: 13.2 % (ref 11.2–14.5)
WBC: 8.4 10*3/uL (ref 3.9–10.3)
lymph#: 3.3 10*3/uL (ref 0.9–3.3)

## 2014-04-14 LAB — COMPREHENSIVE METABOLIC PANEL (CC13)
ALK PHOS: 64 U/L (ref 40–150)
ALT: 51 U/L (ref 0–55)
AST: 34 U/L (ref 5–34)
Albumin: 3.9 g/dL (ref 3.5–5.0)
Anion Gap: 10 mEq/L (ref 3–11)
BILIRUBIN TOTAL: 0.43 mg/dL (ref 0.20–1.20)
BUN: 18 mg/dL (ref 7.0–26.0)
CO2: 25 mEq/L (ref 22–29)
Calcium: 9.8 mg/dL (ref 8.4–10.4)
Chloride: 107 mEq/L (ref 98–109)
Creatinine: 1.1 mg/dL (ref 0.6–1.1)
EGFR: 50 mL/min/{1.73_m2} — ABNORMAL LOW (ref >90)
Glucose: 121 mg/dl (ref 70–140)
Potassium: 4.4 mEq/L (ref 3.5–5.1)
Sodium: 143 mEq/L (ref 136–145)
Total Protein: 7.5 g/dL (ref 6.4–8.3)

## 2014-04-14 MED ORDER — IOHEXOL 300 MG/ML  SOLN
100.0000 mL | Freq: Once | INTRAMUSCULAR | Status: AC | PRN
  Administered 2014-04-14: 100 mL via INTRAVENOUS

## 2014-04-16 ENCOUNTER — Telehealth: Payer: Self-pay | Admitting: Oncology

## 2014-04-16 ENCOUNTER — Ambulatory Visit (HOSPITAL_BASED_OUTPATIENT_CLINIC_OR_DEPARTMENT_OTHER): Payer: Medicare Other | Admitting: Oncology

## 2014-04-16 VITALS — BP 135/71 | HR 85 | Temp 98.0°F | Resp 19 | Ht 64.0 in | Wt 223.7 lb

## 2014-04-16 DIAGNOSIS — C649 Malignant neoplasm of unspecified kidney, except renal pelvis: Secondary | ICD-10-CM

## 2014-04-16 NOTE — Telephone Encounter (Signed)
Gave patient avs report and appointments for september. Central radiology scheduling will call patient regarding ct appointment. Patient aware.

## 2014-04-16 NOTE — Progress Notes (Signed)
Hematology and Oncology Follow Up Visit  Amanda Crawford [id number] 06-29-1945 69 y.o. 04/16/2014 9:35 AM  CC: Nicanor Alcon, M.D.  Priscille Heidelberg Little, M.D.    Principle Diagnosis: This is a 69 year old female diagnosed with renal cell carcinoma, diagnosed in 2001, had a T2 disease and had recurrent disease in a lung nodule in 2008.  Prior Therapy:  She is status post left nephrectomy in 2001.  She developed a left lingular mass, status post VATS procedure.  Pathology revealed a recurrent renal cell carcinoma resected in October 2008.   She developed relapse disease in 2014 and subsequently underwent a VATS and left upper lobe wedge resection of a tumor done on 05/09/2012. Pathology confirmed another relapse.  Current therapy: Observation and surveillance.  Interim History: Amanda Crawford presents today for a followup visit with her husband. Since the last visit, she has no complaints. She continues to have occasional exertional dyspnea but none at rest. She reports no chest pain or cough.She did not report any constitutional symptoms, did not report any weight loss.  She continued to perform more activities of daily living without any major hindrance or decline. She still has some residual pain and numbness around the thoracotomy site on the left axillary area. Her pain is manageable and able to sleep without any problems. She has not reported any GI symptoms including abdominal pain or diarrhea. She has not reported any fever or hematochezia. She does not report any frequency urgency or hesitancy. She does not report any skeletal complaints. Does not report any lymphadenopathy or petechiae. Rest of the review of systems unremarkable.   Medications: I have reviewed the patient's current medications. Current Outpatient Prescriptions  Medication Sig Dispense Refill  . Biotin 1 MG CAPS Take by mouth.    . Calcium Carbonate (CALTRATE 600) 1500 MG TABS Take by mouth.    . cholecalciferol (VITAMIN D)  1000 UNITS tablet Take 1,000 Units by mouth daily.    . colchicine 0.6 MG tablet Take 0.6 mg by mouth daily as needed. For gout    . diphenhydramine-acetaminophen (TYLENOL PM) 25-500 MG TABS Take 1 tablet by mouth at bedtime as needed. For insomnia    . fish oil-omega-3 fatty acids 1000 MG capsule Take 2 g by mouth daily.    . fluticasone (FLONASE) 50 MCG/ACT nasal spray Place 1 spray into the nose.    Marland Kitchen glucosamine-chondroitin 500-400 MG tablet Take 1 tablet by mouth daily.    . meclizine (ANTIVERT) 12.5 MG tablet Take 12.5 mg by mouth 3 (three) times daily as needed for dizziness. As needed for   Dizziness or nausea     No current facility-administered medications for this visit.     Allergies: No Known Allergies  Past Medical History, Surgical history, Social history, and Family History were reviewed and updated.   Physical Exam: Blood pressure 135/71, pulse 85, temperature 98 F (36.7 C), temperature source Oral, resp. rate 19, height 5\' 4"  (1.626 m), weight 223 lb 11.2 oz (101.47 kg), SpO2 96 %. ECOG: 1 General appearance: alert awake not in any distress. Head: Normocephalic, without obvious abnormality Neck: no adenopathy Lymph nodes: Cervical, supraclavicular, and axillary nodes normal. Heart:regular rate and rhythm, S1, S2 normal, no murmur, click, rub or gallop Lung:chest clear, no wheezing, rales, normal symmetric air entry. No dullness to percussion. Abdomin: soft, non-tender, without masses or organomegaly EXT:no erythema, induration, or nodules Skin: No rashes or lesions.  Lab Results: Lab Results  Component Value Date   WBC  8.4 04/14/2014   HGB 14.7 04/14/2014   HCT 45.0 04/14/2014   MCV 93.9 04/14/2014   PLT 269 04/14/2014     Chemistry      Component Value Date/Time   NA 143 04/14/2014 0813   NA 141 05/11/2012 0440   NA 137 07/05/2010 1117   K 4.4 04/14/2014 0813   K 4.2 05/11/2012 0440   K 4.9* 07/05/2010 1117   CL 103 05/11/2012 0440   CL 107  04/16/2012 0923   CL 96* 07/05/2010 1117   CO2 25 04/14/2014 0813   CO2 29 05/11/2012 0440   CO2 29 07/05/2010 1117   BUN 18.0 04/14/2014 0813   BUN 9 05/11/2012 0440   BUN 25* 07/05/2010 1117   CREATININE 1.1 04/14/2014 0813   CREATININE 0.91 05/11/2012 0440   CREATININE 1.4* 07/05/2010 1117      Component Value Date/Time   CALCIUM 9.8 04/14/2014 0813   CALCIUM 9.3 05/11/2012 0440   CALCIUM 9.9 07/05/2010 1117   ALKPHOS 64 04/14/2014 0813   ALKPHOS 52 05/11/2012 0440   ALKPHOS 65 07/05/2010 1117   AST 34 04/14/2014 0813   AST 28 05/11/2012 0440   AST 34 07/05/2010 1117   ALT 51 04/14/2014 0813   ALT 36* 05/11/2012 0440   ALT 44 07/05/2010 1117   BILITOT 0.43 04/14/2014 0813   BILITOT 0.7 05/11/2012 0440   BILITOT 1.00 07/05/2010 1117     CLINICAL DATA: Subsequent treatment strategy for renal cell carcinoma. Left upper lobe lung resection in 08/2012 for metastasis.  EXAM: CT CHEST, ABDOMEN, AND PELVIS WITH CONTRAST  TECHNIQUE: Multidetector CT imaging of the chest, abdomen and pelvis was performed following the standard protocol during bolus administration of intravenous contrast.  CONTRAST: 189mL OMNIPAQUE IOHEXOL 300 MG/ML SOLN  COMPARISON: CT 10/07/2013  FINDINGS: CT CHEST FINDINGS  Mediastinum/Nodes: No axillary or supraclavicular lymphadenopathy. No mediastinal or hilar lymphadenopathy. No pericardial fluid. No central pulmonary emboli. Esophagus is normal.  Lungs/Pleura: Postoperative change in the left upper lobe consists with resection. No nodularity in the left or right lung.  CT ABDOMEN AND PELVIS FINDINGS  Hepatobiliary: No focal hepatic lesion. Post cholecystectomy.  Pancreas: Pancreas is normal. No ductal dilatation. No pancreatic inflammation.  Spleen: Normal spleen.  Adrenals/Urinary Tract: Right adrenal gland and kidney are normal. Patient status post left nephrectomy. The left adrenal glands normal. There is no  nodularity in the nephrectomy bed.  Stomach/Bowel: Stomach, small bowel, and colon are normal.  Vascular/Lymphatic: Abdominal aorta is normal caliber. There is no retroperitoneal or periportal lymphadenopathy. No pelvic lymphadenopathy. Small periaortic lymph nodes less than 5 millimeters short axis.  Reproductive: Post hysterectomy anatomy.  Other: No free fluid the abdomen pelvis.  Musculoskeletal: No aggressive osseous lesion.  IMPRESSION: Chest Impression:  1. No evidence of thoracic metastasis.  2. Stable left upper lobe wedge resection.  Abdomen / Pelvis Impression:  1. Stable left nephrectomy bed. 2. No evidence of metastatic disease in the abdomen or pelvis.    Impression and Plan:   This is a pleasant 69 year-old female with the following issues: 1. Renal cell carcinoma.  She has stage IV NED disease with the last lung nodule removed in 04/2012. CT scan from 04/14/2014 was reviewed today did not show any evidence of relapsed disease. At this time, I see no role for systemic treatment but she needs to close observation. I will repeat CT scans in 6 months and will use systemic therapy if she develops multiple lesions at that time.  2.     Diverticulosis: She has no evidence of diverticulitis and she had a colonoscopy recently without any complications. 3.     Pain around the thoracotomy site: Manageable at this time and have not changed. 4.     Follow up: in 6 months for clinical visit and CT scan.    [facility], MD 3/17/20169:35 AM

## 2014-10-21 ENCOUNTER — Ambulatory Visit (HOSPITAL_COMMUNITY)
Admission: RE | Admit: 2014-10-21 | Discharge: 2014-10-21 | Disposition: A | Discharge status: Home / Self Care | Payer: Medicare Other | Source: Ambulatory Visit | Attending: Oncology | Admitting: Oncology

## 2014-10-21 ENCOUNTER — Other Ambulatory Visit (HOSPITAL_BASED_OUTPATIENT_CLINIC_OR_DEPARTMENT_OTHER): Payer: Medicare Other

## 2014-10-21 ENCOUNTER — Other Ambulatory Visit: Payer: Medicare Other

## 2014-10-21 DIAGNOSIS — Z902 Acquired absence of lung [part of]: Secondary | ICD-10-CM | POA: Insufficient documentation

## 2014-10-21 DIAGNOSIS — Z905 Acquired absence of kidney: Secondary | ICD-10-CM | POA: Insufficient documentation

## 2014-10-21 DIAGNOSIS — Z85118 Personal history of other malignant neoplasm of bronchus and lung: Secondary | ICD-10-CM | POA: Diagnosis not present

## 2014-10-21 DIAGNOSIS — K5730 Diverticulosis of large intestine without perforation or abscess without bleeding: Secondary | ICD-10-CM | POA: Insufficient documentation

## 2014-10-21 DIAGNOSIS — C649 Malignant neoplasm of unspecified kidney, except renal pelvis: Secondary | ICD-10-CM

## 2014-10-21 DIAGNOSIS — I700 Atherosclerosis of aorta: Secondary | ICD-10-CM | POA: Diagnosis not present

## 2014-10-21 LAB — COMPREHENSIVE METABOLIC PANEL (CC13)
ALK PHOS: 68 U/L (ref 40–150)
ALT: 59 U/L — ABNORMAL HIGH (ref 0–55)
ANION GAP: 7 meq/L (ref 3–11)
AST: 44 U/L — ABNORMAL HIGH (ref 5–34)
Albumin: 3.9 g/dL (ref 3.5–5.0)
BILIRUBIN TOTAL: 0.4 mg/dL (ref 0.20–1.20)
BUN: 17.4 mg/dL (ref 7.0–26.0)
CALCIUM: 9.9 mg/dL (ref 8.4–10.4)
CO2: 26 mEq/L (ref 22–29)
CREATININE: 1 mg/dL (ref 0.6–1.1)
Chloride: 107 mEq/L (ref 98–109)
EGFR: 54 mL/min/{1.73_m2} — AB (ref >90)
Glucose: 100 mg/dl (ref 70–140)
Potassium: 4.8 mEq/L (ref 3.5–5.1)
Sodium: 141 mEq/L (ref 136–145)
TOTAL PROTEIN: 7.4 g/dL (ref 6.4–8.3)

## 2014-10-21 LAB — CBC WITH DIFFERENTIAL/PLATELET
BASO%: 1 % (ref 0.0–2.0)
Basophils Absolute: 0.1 10*3/uL (ref 0.0–0.1)
EOS ABS: 0.2 10*3/uL (ref 0.0–0.5)
EOS%: 2.2 % (ref 0.0–7.0)
HEMATOCRIT: 44 % (ref 34.8–46.6)
HGB: 14.6 g/dL (ref 11.6–15.9)
LYMPH#: 3.1 10*3/uL (ref 0.9–3.3)
LYMPH%: 33.5 % (ref 14.0–49.7)
MCH: 30.3 pg (ref 25.1–34.0)
MCHC: 33.2 g/dL (ref 31.5–36.0)
MCV: 91.2 fL (ref 79.5–101.0)
MONO#: 0.5 10*3/uL (ref 0.1–0.9)
MONO%: 6 % (ref 0.0–14.0)
NEUT#: 5.3 10*3/uL (ref 1.5–6.5)
NEUT%: 57.3 % (ref 38.4–76.8)
PLATELETS: 299 10*3/uL (ref 145–400)
RBC: 4.82 10*6/uL (ref 3.70–5.45)
RDW: 13.6 % (ref 11.2–14.5)
WBC: 9.2 10*3/uL (ref 3.9–10.3)

## 2014-10-21 MED ORDER — IOHEXOL 300 MG/ML  SOLN
100.0000 mL | Freq: Once | INTRAMUSCULAR | Status: AC | PRN
  Administered 2014-10-21: 100 mL via INTRAVENOUS

## 2014-10-23 ENCOUNTER — Ambulatory Visit (HOSPITAL_BASED_OUTPATIENT_CLINIC_OR_DEPARTMENT_OTHER): Payer: Medicare Other | Admitting: Oncology

## 2014-10-23 VITALS — BP 147/71 | HR 87 | Temp 98.4°F | Resp 18 | Ht 64.0 in | Wt 225.7 lb

## 2014-10-23 DIAGNOSIS — C649 Malignant neoplasm of unspecified kidney, except renal pelvis: Secondary | ICD-10-CM

## 2014-10-23 NOTE — Addendum Note (Signed)
Addended by: Randolm Idol on: 10/23/2014 10:04 AM   Modules accepted: Medications

## 2014-10-23 NOTE — Progress Notes (Signed)
Hematology and Oncology Follow Up Visit  Amanda Crawford [id number] [birth year] 69 y.o. 10/23/2014 9:40 AM  CC: Amanda Crawford, M.D.  Amanda Crawford, M.D.    Principle Diagnosis: This is a 69 year old female diagnosed with renal cell carcinoma, diagnosed in 2001, had a T2 disease and had recurrent disease in a lung nodule in 2008.  Prior Therapy:  She is status post left nephrectomy in 2001.  She developed a left lingular mass, status post VATS procedure.  Pathology revealed a recurrent renal cell carcinoma resected in October 2008.   She developed relapse disease in 2014 and subsequently underwent a VATS and left upper lobe wedge resection of a tumor done on 05/09/2012. Pathology confirmed another relapse.  Current therapy: Observation and surveillance.  Interim History: Amanda Crawford presents today for a followup visit with her husband. Since the last visit, she continues to do well. She continues to have problems with her weight and not able to lose weight. She continues to have occasional discomfort associated with her thoracic surgery that was done in 2014. She feels that the scar is occasionally tender.    She does report occasional exertional dyspnea but none at rest. She reports no chest pain or cough.She did not report any constitutional symptoms. She continued to perform more activities of daily living without any major hindrance or decline. She still has some residual pain and numbness around the thoracotomy site on the left axillary area. She has not reported any GI symptoms including abdominal pain or diarrhea. She has not reported any fever or hematochezia. She does not report any frequency urgency or hesitancy. She does not report any skeletal complaints. Does not report any lymphadenopathy or petechiae. Rest of the review of systems unremarkable.   Medications: I have reviewed the patient's current medications. Current Outpatient Prescriptions  Medication Sig Dispense Refill  .  Biotin 1 MG CAPS Take by mouth.    . Calcium Carbonate (CALTRATE 600) 1500 MG TABS Take by mouth.    . cholecalciferol (VITAMIN D) 1000 UNITS tablet Take 1,000 Units by mouth daily.    . colchicine 0.6 MG tablet Take 0.6 mg by mouth daily as needed. For gout    . diphenhydramine-acetaminophen (TYLENOL PM) 25-500 MG TABS Take 1 tablet by mouth at bedtime as needed. For insomnia    . fish oil-omega-3 fatty acids 1000 MG capsule Take 2 g by mouth daily.    . fluticasone (FLONASE) 50 MCG/ACT nasal spray Place 1 spray into the nose.    Marland Kitchen glucosamine-chondroitin 500-400 MG tablet Take 1 tablet by mouth daily.    . meclizine (ANTIVERT) 12.5 MG tablet Take 12.5 mg by mouth 3 (three) times daily as needed for dizziness. As needed for   Dizziness or nausea     No current facility-administered medications for this visit.     Allergies: No Known Allergies  Past Medical History, Surgical history, Social history, and Family History were reviewed and updated.   Physical Exam: Blood pressure 147/71, pulse 87, temperature 98.4 F (36.9 C), temperature source Oral, resp. rate 18, height '5\' 4"'$  (1.626 m), weight 225 lb 11.2 oz (102.377 kg), SpO2 94 %. ECOG: 1 General appearance: alert pleasant woman without distress. Head: Normocephalic, without obvious abnormality no oral ulcers or lesions. Neck: no adenopathy Lymph nodes: Cervical, supraclavicular, and axillary nodes normal. Heart:regular rate and rhythm, S1, S2 normal, no murmur, click, rub or gallop Lung:chest clear, no wheezing, rales, normal symmetric air entry. No dullness to percussion. Abdomin: soft,  non-tender, without masses or organomegaly no shifting dullness or ascites. EXT:no erythema, induration, or nodules Skin: No rashes or lesions.  Lab Results: Lab Results  Component Value Date   WBC 9.2 10/21/2014   HGB 14.6 10/21/2014   HCT 44.0 10/21/2014   MCV 91.2 10/21/2014   PLT 299 10/21/2014     Chemistry      Component Value  Date/Time   NA 141 10/21/2014 1138   NA 141 05/11/2012 0440   NA 137 07/05/2010 1117   K 4.8 10/21/2014 1138   K 4.2 05/11/2012 0440   K 4.9* 07/05/2010 1117   CL 103 05/11/2012 0440   CL 107 04/16/2012 0923   CL 96* 07/05/2010 1117   CO2 26 10/21/2014 1138   CO2 29 05/11/2012 0440   CO2 29 07/05/2010 1117   BUN 17.4 10/21/2014 1138   BUN 9 05/11/2012 0440   BUN 25* 07/05/2010 1117   CREATININE 1.0 10/21/2014 1138   CREATININE 0.91 05/11/2012 0440   CREATININE 1.4* 07/05/2010 1117      Component Value Date/Time   CALCIUM 9.9 10/21/2014 1138   CALCIUM 9.3 05/11/2012 0440   CALCIUM 9.9 07/05/2010 1117   ALKPHOS 68 10/21/2014 1138   ALKPHOS 52 05/11/2012 0440   ALKPHOS 65 07/05/2010 1117   AST 44* 10/21/2014 1138   AST 28 05/11/2012 0440   AST 34 07/05/2010 1117   ALT 59* 10/21/2014 1138   ALT 36* 05/11/2012 0440   ALT 44 07/05/2010 1117   BILITOT 0.40 10/21/2014 1138   BILITOT 0.7 05/11/2012 0440   BILITOT 1.00 07/05/2010 1117     EXAM: CT ABDOMEN AND PELVIS WITH CONTRAST  TECHNIQUE: Multidetector CT imaging of the abdomen and pelvis was performed using the standard protocol following bolus administration of intravenous contrast.  CONTRAST: 141m OMNIPAQUE IOHEXOL 300 MG/ML SOLN  COMPARISON: 10/07/2013  FINDINGS: CT CHEST FINDINGS  Mediastinum/Nodes: No breast masses are identified. No supraclavicular or axillary lymphadenopathy. The thyroid gland appears normal.  The heart is normal in size. No pericardial effusion. The aorta is normal in caliber. Minimal scattered atherosclerotic calcifications. No dissection. The branch vessels are patent. Coronary artery calcifications are noted. The esophagus is grossly normal.  No mediastinal or hilar mass or lymphadenopathy. A few small scattered lymph nodes are noted.  Lungs/Pleura: The lungs are clear of acute process. No infiltrates, edema or effusions. Stable surgical changes involving the left  hemi thorax. No worrisome pulmonary lesions or pulmonary nodules. No bronchiectasis or interstitial lung disease. The tracheobronchial tree is unremarkable.  Musculoskeletal: The bony thorax is intact. No destructive bone lesions or spinal canal compromise.  CT ABDOMEN PELVIS FINDINGS  Hepatobiliary: No focal hepatic lesions or intrahepatic biliary dilatation. Diffuse fatty infiltration. The gallbladder is surgically absent. No common bile duct dilatation.  Pancreas: No mass, inflammation or ductal dilatation.  Spleen: Normal size. No focal lesions.  Adrenals/Urinary Tract: The adrenal glands are normal and stable. Stable surgical changes from a left nephrectomy. No findings for residual or recurrent tumor. The right kidney is normal. Mild compensatory hypertrophy. No renal mass or hydronephrosis.  Stomach/Bowel: The stomach, duodenum, small bowel and colon are unremarkable. No inflammatory changes, mass lesions or obstructive findings. Moderate sigmoid diverticulosis. The terminal ileum is normal.  Vascular/Lymphatic: Moderate atherosclerotic calcifications involving the aorta. No aneurysm or dissection. The major venous structures are patent.  Reproductive: The uterus and ovaries are surgically absent. No pelvic mass or adenopathy. No free pelvic fluid collections. The bladder appears normal. No inguinal mass  or adenopathy.  Other: No ascites, abdominal wall hernia or subcutaneous lesions.  Musculoskeletal: No acute bony findings. Moderate degenerative changes involving the lumbar spine.  IMPRESSION: 1. Surgical changes involving the left hemi thorax but no findings for recurrent tumor car metastatic disease or lymphadenopathy. 2. No acute pulmonary findings. 3. Surgical changes from a left nephrectomy. No findings for recurrent tumor, adenopathy or metastatic disease.    Impression and Plan:   This is a pleasant 69 year-old female with the  following issues: 1. Renal cell carcinoma.  She has stage IV NED disease with the last lung nodule removed in 04/2012. CT scan from 10/21/2014 was reviewed today did not show any evidence of relapsed disease. The plan is to continue with active surveillance and repeat imaging studies every 6 months. If she develops recurrent disease, systemic therapy could be considered at that time.  2.     Diverticulosis: She has no evidence of diverticulitis and she had a colonoscopy recently without any complications. 3.     Pain around the thoracotomy site: No recurrence noted on the CT scan. 4.     Dyspnea on exertion: Likely related to her weight and deconditioning. No lung pathology noted on the scan. 5.     Follow up: in 6 months for clinical visit and CT scan.    [facility], MD 9/23/20169:40 AM

## 2014-11-18 ENCOUNTER — Telehealth: Payer: Self-pay | Admitting: Family Medicine

## 2014-11-18 NOTE — Telephone Encounter (Signed)
Caller name: Murriel Hopper (patient of Dr. Charlett Blake)  Relation to pt:  Friend    Reason for call:   Murriel Hopper [phone] referred patient and states Dr. Charlett Blake is extremely thorough and if there is anything she can say or do for Dr. Charlett Blake to see patient before her new patient appointment which is scheduled for March. Patient is exp lower abdominal pain and was scheduled with the PA for 11/19/14. But friend would prefer patient to see Dr. Charlett Blake please advise

## 2014-11-18 NOTE — Telephone Encounter (Signed)
If patient is having abdominal pain she should be seen urgently by whomever has openings. If she keeps the 10/20 visit I can do a quick follow up after that. I can get her in before March I am sure but not necessarily this week.

## 2014-11-19 ENCOUNTER — Ambulatory Visit (HOSPITAL_BASED_OUTPATIENT_CLINIC_OR_DEPARTMENT_OTHER)
Admission: RE | Admit: 2014-11-19 | Discharge: 2014-11-19 | Disposition: A | Discharge status: Home / Self Care | Payer: Medicare Other | Source: Ambulatory Visit | Attending: Medical | Admitting: Medical

## 2014-11-19 ENCOUNTER — Encounter: Payer: Self-pay | Admitting: Medical

## 2014-11-19 ENCOUNTER — Ambulatory Visit (INDEPENDENT_AMBULATORY_CARE_PROVIDER_SITE_OTHER): Payer: Medicare Other | Admitting: Medical

## 2014-11-19 VITALS — BP 120/78 | HR 67 | Temp 98.0°F | Ht 64.0 in | Wt 224.2 lb

## 2014-11-19 DIAGNOSIS — Z86010 Personal history of colonic polyps: Secondary | ICD-10-CM | POA: Insufficient documentation

## 2014-11-19 DIAGNOSIS — E785 Hyperlipidemia, unspecified: Secondary | ICD-10-CM | POA: Diagnosis not present

## 2014-11-19 DIAGNOSIS — M8580 Other specified disorders of bone density and structure, unspecified site: Secondary | ICD-10-CM

## 2014-11-19 DIAGNOSIS — K219 Gastro-esophageal reflux disease without esophagitis: Secondary | ICD-10-CM | POA: Diagnosis not present

## 2014-11-19 DIAGNOSIS — R1032 Left lower quadrant pain: Secondary | ICD-10-CM

## 2014-11-19 DIAGNOSIS — K635 Polyp of colon: Secondary | ICD-10-CM

## 2014-11-19 DIAGNOSIS — Z85118 Personal history of other malignant neoplasm of bronchus and lung: Secondary | ICD-10-CM | POA: Insufficient documentation

## 2014-11-19 DIAGNOSIS — R7989 Other specified abnormal findings of blood chemistry: Secondary | ICD-10-CM

## 2014-11-19 DIAGNOSIS — E559 Vitamin D deficiency, unspecified: Secondary | ICD-10-CM | POA: Insufficient documentation

## 2014-11-19 HISTORY — DX: Polyp of colon: K63.5

## 2014-11-19 LAB — COMPREHENSIVE METABOLIC PANEL
ALBUMIN: 4.2 g/dL (ref 3.5–5.2)
ALT: 49 U/L — ABNORMAL HIGH (ref 0–35)
AST: 45 U/L — ABNORMAL HIGH (ref 0–37)
Alkaline Phosphatase: 57 U/L (ref 39–117)
BUN: 18 mg/dL (ref 6–23)
CO2: 28 mEq/L (ref 19–32)
Calcium: 10.3 mg/dL (ref 8.4–10.5)
Chloride: 103 mEq/L (ref 96–112)
Creatinine, Ser: 1.02 mg/dL (ref 0.40–1.20)
GFR: 56.69 mL/min — AB (ref >60.00)
Glucose, Bld: 113 mg/dL — ABNORMAL HIGH (ref 70–99)
POTASSIUM: 4.4 meq/L (ref 3.5–5.1)
Sodium: 138 mEq/L (ref 135–145)
TOTAL PROTEIN: 7.8 g/dL (ref 6.0–8.3)
Total Bilirubin: 0.6 mg/dL (ref 0.2–1.2)

## 2014-11-19 LAB — CBC WITH DIFFERENTIAL/PLATELET
Basophils Absolute: 0.1 10*3/uL (ref 0.0–0.1)
Basophils Relative: 0.6 % (ref 0.0–3.0)
EOS PCT: 2.4 % (ref 0.0–5.0)
Eosinophils Absolute: 0.2 10*3/uL (ref 0.0–0.7)
HCT: 44.2 % (ref 36.0–46.0)
HEMOGLOBIN: 14.7 g/dL (ref 12.0–15.0)
Lymphocytes Relative: 31 % (ref 12.0–46.0)
Lymphs Abs: 3.2 10*3/uL (ref 0.7–4.0)
MCHC: 33.2 g/dL (ref 30.0–36.0)
MCV: 90.9 fl (ref 78.0–100.0)
MONOS PCT: 5.6 % (ref 3.0–12.0)
Monocytes Absolute: 0.6 10*3/uL (ref 0.1–1.0)
Neutro Abs: 6.2 10*3/uL (ref 1.4–7.7)
Neutrophils Relative %: 60.4 % (ref 43.0–77.0)
Platelets: 323 10*3/uL (ref 150.0–400.0)
RBC: 4.86 Mil/uL (ref 3.87–5.11)
RDW: 13.1 % (ref 11.5–15.5)
WBC: 10.2 10*3/uL (ref 4.0–10.5)

## 2014-11-19 MED ORDER — CIPROFLOXACIN HCL 500 MG PO TABS: 500.0000 mg | ORAL_TABLET | Freq: Two times a day (BID) | ORAL | Status: DC

## 2014-11-19 MED ORDER — METRONIDAZOLE 500 MG PO TABS: 500.0000 mg | ORAL_TABLET | Freq: Three times a day (TID) | ORAL | Status: DC

## 2014-11-19 NOTE — Assessment & Plan Note (Signed)
On vitamin d. Will check at later date.

## 2014-11-19 NOTE — Assessment & Plan Note (Signed)
Hx of but no acute flare/presently. Entered for historical purpose on first visit. Her for llq pain.

## 2014-11-19 NOTE — Telephone Encounter (Signed)
Patient was seen to day by Percell Miller for abdominal pain and there was a new patient appointment for 11/23/2014. Everything worked out

## 2014-11-19 NOTE — Progress Notes (Signed)
Pre visit review using our clinic review tool, if applicable. No additional management support is needed unless otherwise documented below in the visit note. 

## 2014-11-19 NOTE — Assessment & Plan Note (Signed)
She is on vitamin D. Has had dexa 2-3 yrs ago per pt. Will follow later. Entered to establish pt today.,

## 2014-11-19 NOTE — Assessment & Plan Note (Signed)
Mild in past. Will check on future date. Here first time for llq pain.

## 2014-11-19 NOTE — Progress Notes (Signed)
Subjective:    Patient ID: Amanda Crawford, female    DOB: Sep 09, 1945, 69 y.o.   MRN: [id number]  HPI   I have reviewed pt PMH, PSH, FH, Social History and Surgical History  Pt used to work [employer], no exercise, 2 cups coffee a day. Healthy diet. Married- 2 children.  Pt has arthritis-Probable osteoarthritis.  Cancer- Pt had left side renal CA in 2001(nephroctomy). Rt side kidney function ok per pt.  Lung cancer- upper left side. Wedge resection of portion.(2008 and 2014). Pt is being followed. Scan 3 wks ago was negative. Followed by cancer center with [facility].  Gerd-Pt burning and belching for 10 years. Pt staes no egd in past.   Osteopenia hx.  Low vitamin D hx- She is on vitamin D.   Allergic rhinitis- possible fall allergies. Gets nasal congestion. On flonase.  Pt in with some left lower quadrant faint tenderness for about one week. No diarrhea. Pt last bm was this am. Small amount. Also one bm  last night normal/fair amount. Pt does not have hx of constipation. No hx of obstruction. Pt states her last colonoscopy was 2 years ago. Pt had seven polyps. Told to reschedule in 3 years.   Hyperlipidemia- borderline high in the past.    Review of Systems  Constitutional: Negative for fever, chills, diaphoresis, activity change and fatigue.  Respiratory: Negative for cough, chest tightness and shortness of breath.   Cardiovascular: Negative for chest pain, palpitations and leg swelling.  Gastrointestinal: Positive for abdominal pain. Negative for nausea, vomiting, diarrhea, constipation, blood in stool and rectal pain.  Genitourinary: Negative.   Musculoskeletal: Negative for back pain, neck pain and neck stiffness.       On chronic mild lt cva pain post nephrectomy.  Neurological: Negative for dizziness, weakness and headaches.  Psychiatric/Behavioral: Negative for behavioral problems, confusion and agitation. The patient is not nervous/anxious.     Past Medical  History  Diagnosis Date  . Shortness of breath     occ  . Pneumonia     child  . Headache(784.0)     migraines  . Arthritis   . Lung cancer ([place])   . GERD (gastroesophageal reflux disease)     occ  . Renal cell cancer ([place])     renal cell ca dx 9/01 and 8/08;    Social History   Social History  . Marital Status: Married    Spouse Name: N/A  . Number of Children: N/A  . Years of Education: N/A   Occupational History  . Not on file.   Social History Main Topics  . Smoking status: Former Smoker -- 0.50 packs/day for 35 years    Types: Cigarettes    Quit date: 05/08/2006  . Smokeless tobacco: Not on file  . Alcohol Use: No  . Drug Use: No  . Sexual Activity: Not on file   Other Topics Concern  . Not on file   Social History Narrative    Past Surgical History  Procedure Laterality Date  . Abdominal hysterectomy    . Lung cancer surgery Left 08  . Renal mass excision Left 01  . Cholecystectomy    . Appendectomy    . Cardiac catheterization      yrs ago neg  . Video bronchoscopy N/A 05/09/2012    Procedure: VIDEO BRONCHOSCOPY;  Surgeon: Gaye Pollack, MD;  Location: [facility];  Service: Thoracic;  Laterality: N/A;  . Thoracotomy Left 05/09/2012    Procedure: THORACOTOMY MAJOR;  Surgeon: Gaye Pollack, MD;  Location: [facility];  Service: Thoracic;  Laterality: Left;  . Wedge resection Left 05/09/2012    Procedure: LEFT UPPER LOBE WEDGE RESECTION;  Surgeon: Gaye Pollack, MD;  Location: [facility];  Service: Thoracic;  Laterality: Left;    Family History  Problem Relation Age of Onset  . Heart failure Father     No Known Allergies  Current Outpatient Prescriptions on File Prior to Visit  Medication Sig Dispense Refill  . Biotin 1 MG CAPS Take by mouth.    . Calcium Carbonate (CALTRATE 600) 1500 MG TABS Take by mouth.    . cholecalciferol (VITAMIN D) 1000 UNITS tablet Take 1,000 Units by mouth daily.    . colchicine 0.6 MG tablet Take 0.6 mg by mouth daily as needed.  For gout    . diphenhydramine-acetaminophen (TYLENOL PM) 25-500 MG TABS Take 1 tablet by mouth at bedtime as needed. For insomnia    . fish oil-omega-3 fatty acids 1000 MG capsule Take 2 g by mouth daily.    Marland Kitchen glucosamine-chondroitin 500-400 MG tablet Take 1 tablet by mouth daily.    . meclizine (ANTIVERT) 12.5 MG tablet Take 12.5 mg by mouth 3 (three) times daily as needed for dizziness. As needed for   Dizziness or nausea     No current facility-administered medications on file prior to visit.    BP 120/78 mmHg  Pulse 67  Temp(Src) 98 F (36.7 C) (Oral)  Ht '5\' 4"'$  (1.626 m)  Wt 224 lb 3.2 oz (101.696 kg)  BMI 38.46 kg/m2  SpO2 95%       Objective:   Physical Exam  General Mental Status- Alert. General Appearance- Not in acute distress.   Skin General: Color- Normal Color. Moisture- Normal Moisture.  Neck Carotid Arteries- Normal color. Moisture- Normal Moisture. No carotid bruits. No JVD.  Chest and Lung Exam Auscultation: Breath Sounds:-Normal. CTA  Cardiovascular Auscultation:Rythm- Regular. Murmurs & Other Heart Sounds:Auscultation of the heart reveals- No Murmurs.   Abdomen Inspection:-Inspection Normal.  Palpation/Perucssion: Palpation and Percussion of the abdomen reveal- moderate left llq Tender to palpation, No Rebound tenderness, No rigidity(Guarding) and No Palpable abdominal masses.  Liver:-Normal.  Spleen:- Normal.   Rectal Anorectal Exam: Stool - Hemoccult of stool/mucous is Heme Negative. External - normal external exam. Internal - normal sphincter tone. No rectal mass.   Neurologic Cranial Nerve exam:- CN III-XII intact(No nystagmus), symmetric smile. Strength:- 5/5 equal and symmetric strength both upper and lower extremities.      Assessment & Plan:

## 2014-11-19 NOTE — Assessment & Plan Note (Signed)
With your left lower quadrant pain will treat for diverticulitis as we approach weekend. Please sign release form so we can get old colonosocpy report. Will get cbc, cmp and plain abd xray today. With your abdomen surgery history obstruction can occur so if you have worse signs or symptoms be seen over weekend. Also, if pain worsens left lower quadrant ct imaging may be indicated.  Rx cipro and flagyl today.  Follow up in 5 days or as needed.

## 2014-11-19 NOTE — Patient Instructions (Addendum)
Pain in the abdomen With your left lower quadrant pain will treat for diverticulitis as we approach weekend. Please sign release form so we can get old colonosocpy report. Will get cbc, cmp and plain abd xray today. With your abdomen surgery history obstruction can occur so if you have worse signs or symptoms be seen over weekend. Also, if pain worsens left lower quadrant ct imaging may be indicated.  Rx cipro and flagyl today.  Follow up in 5 days or as needed.  Osteopenia  She is on vitamin D. Has had dexa 2-3 yrs ago per pt. Will follow later. Entered to establish pt today.,  GERD (gastroesophageal reflux disease) Hx of but no acute flare/presently. Entered for historical purpose on first visit. Her for llq pain.  Hyperlipidemia Mild in past. Will check on future date. Here first time for llq pain.  Low serum vitamin D On vitamin d. Will check at later date.

## 2014-11-23 ENCOUNTER — Encounter: Payer: Self-pay | Admitting: Family Medicine

## 2014-11-23 ENCOUNTER — Ambulatory Visit (INDEPENDENT_AMBULATORY_CARE_PROVIDER_SITE_OTHER): Payer: Medicare Other | Admitting: Family Medicine

## 2014-11-23 VITALS — BP 120/76 | HR 93 | Temp 98.2°F | Ht 64.0 in | Wt 227.2 lb

## 2014-11-23 DIAGNOSIS — Z1239 Encounter for other screening for malignant neoplasm of breast: Secondary | ICD-10-CM

## 2014-11-23 DIAGNOSIS — E785 Hyperlipidemia, unspecified: Secondary | ICD-10-CM

## 2014-11-23 DIAGNOSIS — M1990 Unspecified osteoarthritis, unspecified site: Secondary | ICD-10-CM

## 2014-11-23 DIAGNOSIS — R109 Unspecified abdominal pain: Secondary | ICD-10-CM

## 2014-11-23 DIAGNOSIS — Z85118 Personal history of other malignant neoplasm of bronchus and lung: Secondary | ICD-10-CM | POA: Diagnosis not present

## 2014-11-23 DIAGNOSIS — R0600 Dyspnea, unspecified: Secondary | ICD-10-CM

## 2014-11-23 DIAGNOSIS — G43109 Migraine with aura, not intractable, without status migrainosus: Secondary | ICD-10-CM | POA: Diagnosis not present

## 2014-11-23 DIAGNOSIS — H8110 Benign paroxysmal vertigo, unspecified ear: Secondary | ICD-10-CM

## 2014-11-23 DIAGNOSIS — C649 Malignant neoplasm of unspecified kidney, except renal pelvis: Secondary | ICD-10-CM

## 2014-11-23 DIAGNOSIS — G43909 Migraine, unspecified, not intractable, without status migrainosus: Secondary | ICD-10-CM

## 2014-11-23 DIAGNOSIS — K219 Gastro-esophageal reflux disease without esophagitis: Secondary | ICD-10-CM

## 2014-11-23 DIAGNOSIS — Z8619 Personal history of other infectious and parasitic diseases: Secondary | ICD-10-CM

## 2014-11-23 DIAGNOSIS — M8580 Other specified disorders of bone density and structure, unspecified site: Secondary | ICD-10-CM

## 2014-11-23 DIAGNOSIS — R0609 Other forms of dyspnea: Secondary | ICD-10-CM

## 2014-11-23 DIAGNOSIS — E669 Obesity, unspecified: Secondary | ICD-10-CM

## 2014-11-23 DIAGNOSIS — E559 Vitamin D deficiency, unspecified: Secondary | ICD-10-CM | POA: Diagnosis not present

## 2014-11-23 DIAGNOSIS — R7989 Other specified abnormal findings of blood chemistry: Secondary | ICD-10-CM

## 2014-11-23 HISTORY — DX: Migraine, unspecified, not intractable, without status migrainosus: G43.909

## 2014-11-23 HISTORY — DX: Obesity, unspecified: E66.9

## 2014-11-23 HISTORY — DX: Personal history of other infectious and parasitic diseases: Z86.19

## 2014-11-23 MED ORDER — MECLIZINE HCL 12.5 MG PO TABS: 12.5000 mg | ORAL_TABLET | Freq: Three times a day (TID) | ORAL | Status: DC | PRN

## 2014-11-23 NOTE — Assessment & Plan Note (Signed)
2008 first dx, s/p wedge resection of left lung then 2014 recurrent cx s/p second wedge resection

## 2014-11-23 NOTE — Progress Notes (Signed)
Subjective:    Patient ID: Amanda Crawford, female    DOB: Mar 11, 1945, 69 y.o.   MRN: [id number]  Chief Complaint  Patient presents with  . Establish Care    HPI Patient is in today for new patient appointment. She is feeling what she describes as her baseline today. She does note long-standing trouble with shortness of breath on exertion but does endorse worsening over the last several months. Has a history of a wedge resection in her left upper lobe several years ago. No fevers or chills no signs of acute illness. She describes a history of neurologic migraines with numbness and tingling most notably perioral in her fingers and toes as well as a thickness in her tongue when it occurs. It is not happening presently. Reports mammogram is due has been about a year and a half. No breast complaints. Past medical history is also significant for carcinoma of the kidney as well as hyperlipidemia, low vitamin D and osteopenia. Denies CP/palp/HA/congestion/fevers/GI or GU c/o. Taking meds as prescribed  Past Medical History  Diagnosis Date  . Shortness of breath     occ  . Pneumonia     child  . Headache(784.0)     migraines  . Arthritis   . Lung cancer ([place])   . GERD (gastroesophageal reflux disease)     occ  . Renal cell cancer ([place])     renal cell ca dx 9/01 and 8/08;  . Migraine 11/23/2014  . History of chicken pox 11/23/2014  . H/O measles   . H/O mumps   . Obesity 11/23/2014    Past Surgical History  Procedure Laterality Date  . Abdominal hysterectomy    . Lung cancer surgery Left 08  . Renal mass excision Left 01  . Cholecystectomy    . Appendectomy    . Cardiac catheterization      yrs ago neg  . Video bronchoscopy N/A 05/09/2012    Procedure: VIDEO BRONCHOSCOPY;  Surgeon: Gaye Pollack, MD;  Location: [facility];  Service: Thoracic;  Laterality: N/A;  . Thoracotomy Left 05/09/2012    Procedure: THORACOTOMY MAJOR;  Surgeon: Gaye Pollack, MD;  Location: [facility];  Service:  Thoracic;  Laterality: Left;  . Wedge resection Left 05/09/2012    Procedure: LEFT UPPER LOBE WEDGE RESECTION;  Surgeon: Gaye Pollack, MD;  Location: [facility] OR;  Service: Thoracic;  Laterality: Left;    Family History  Problem Relation Age of Onset  . Heart failure Father   . Asthma Brother   . Asthma Daughter     Social History   Social History  . Marital Status: Married    Spouse Name: N/A  . Number of Children: N/A  . Years of Education: N/A   Occupational History  . Not on file.   Social History Main Topics  . Smoking status: Former Smoker -- 0.50 packs/day for 35 years    Types: Cigarettes    Quit date: 05/08/2006  . Smokeless tobacco: Not on file  . Alcohol Use: No  . Drug Use: No  . Sexual Activity: Not on file     Comment: lives with husband, no dietary restrictions.    Other Topics Concern  . Not on file   Social History Narrative    Outpatient Prescriptions Prior to Visit  Medication Sig Dispense Refill  . Biotin 1 MG CAPS Take by mouth.    . Calcium Carbonate (CALTRATE 600) 1500 MG TABS Take by mouth.    Marland Kitchen  colchicine 0.6 MG tablet Take 0.6 mg by mouth daily as needed. For gout    . diphenhydramine-acetaminophen (TYLENOL PM) 25-500 MG TABS Take 1 tablet by mouth at bedtime as needed. For insomnia    . fish oil-omega-3 fatty acids 1000 MG capsule Take 2 g by mouth daily.    Marland Kitchen glucosamine-chondroitin 500-400 MG tablet Take 1 tablet by mouth daily.    . cholecalciferol (VITAMIN D) 1000 UNITS tablet Take 1,000 Units by mouth daily.    . ciprofloxacin (CIPRO) 500 MG tablet Take 1 tablet (500 mg total) by mouth 2 (two) times daily. 14 tablet 0  . meclizine (ANTIVERT) 12.5 MG tablet Take 12.5 mg by mouth 3 (three) times daily as needed for dizziness. As needed for   Dizziness or nausea    . metroNIDAZOLE (FLAGYL) 500 MG tablet Take 1 tablet (500 mg total) by mouth 3 (three) times daily. 21 tablet 0   No facility-administered medications prior to visit.    No  Known Allergies  Review of Systems  Constitutional: Negative for fever, chills and malaise/fatigue.  HENT: Negative for congestion and hearing loss.   Eyes: Negative for discharge.  Respiratory: Positive for cough and shortness of breath. Negative for sputum production.   Cardiovascular: Negative for chest pain, palpitations and leg swelling.  Gastrointestinal: Negative for heartburn, nausea, vomiting, abdominal pain, diarrhea, constipation and blood in stool.  Genitourinary: Negative for dysuria, urgency, frequency and hematuria.  Musculoskeletal: Negative for myalgias, back pain and falls.  Skin: Negative for rash.  Neurological: Negative for dizziness, sensory change, loss of consciousness, weakness and headaches.  Endo/Heme/Allergies: Negative for environmental allergies. Does not bruise/bleed easily.  Psychiatric/Behavioral: Negative for depression and suicidal ideas. The patient is not nervous/anxious and does not have insomnia.        Objective:    Physical Exam  Constitutional: She is oriented to person, place, and time. She appears well-developed and well-nourished. No distress.  HENT:  Head: Normocephalic and atraumatic.  Eyes: Conjunctivae are normal.  Neck: Neck supple. No thyromegaly present.  Cardiovascular: Normal rate, regular rhythm and normal heart sounds.   No murmur heard. Pulmonary/Chest: Effort normal and breath sounds normal. No respiratory distress.  Abdominal: Soft. Bowel sounds are normal. She exhibits no distension and no mass. There is no tenderness.  Musculoskeletal: She exhibits no edema.  Lymphadenopathy:    She has no cervical adenopathy.  Neurological: She is alert and oriented to person, place, and time.  Skin: Skin is warm and dry.  Psychiatric: She has a normal mood and affect. Her behavior is normal.    BP 120/76 mmHg  Pulse 93  Temp(Src) 98.2 F (36.8 C) (Oral)  Ht 5' 4"  (1.626 m)  Wt 227 lb 4 oz (103.08 kg)  BMI 38.99 kg/m2  SpO2  95% Wt Readings from Last 3 Encounters:  12/01/14 226 lb 9.6 oz (102.785 kg)  11/23/14 227 lb 4 oz (103.08 kg)  11/19/14 224 lb 3.2 oz (101.696 kg)     Lab Results  Component Value Date   WBC 13.7* 11/23/2014   HGB 14.4 11/23/2014   HCT 43.2 11/23/2014   PLT 345.0 11/23/2014   GLUCOSE 81 11/23/2014   CHOL 202* 11/23/2014   TRIG 306.0* 11/23/2014   HDL 50.40 11/23/2014   LDLDIRECT 110.0 11/23/2014   LDLCALC * 01/31/2007    117        Total Cholesterol/HDL:CHD Risk Coronary Heart Disease Risk Table  Men   Women  1/2 Average Risk   3.4   3.3  Average Risk       5.0   4.4  2 X Average Risk   9.6   7.1  3 X Average Risk  23.4   11.0        Use the calculated Patient Ratio above and the CHD Risk Table to determine the patient's CHD Risk.        ATP III CLASSIFICATION (LDL):  <100     mg/dL   Optimal  100-129  mg/dL   Near or Above                    Optimal  130-159  mg/dL   Borderline  160-189  mg/dL   High  >190     mg/dL   Very High   ALT 65* 11/23/2014   AST 58* 11/23/2014   NA 143 11/23/2014   K 4.8 11/23/2014   CL 106 11/23/2014   CREATININE 1.22* 11/23/2014   BUN 18 11/23/2014   CO2 26 11/23/2014   TSH 1.64 11/23/2014   INR 0.99 05/07/2012    Lab Results  Component Value Date   TSH 1.64 11/23/2014   Lab Results  Component Value Date   WBC 13.7* 11/23/2014   HGB 14.4 11/23/2014   HCT 43.2 11/23/2014   MCV 91.1 11/23/2014   PLT 345.0 11/23/2014   Lab Results  Component Value Date   NA 143 11/23/2014   K 4.8 11/23/2014   CHLORIDE 107 10/21/2014   CO2 26 11/23/2014   GLUCOSE 81 11/23/2014   BUN 18 11/23/2014   CREATININE 1.22* 11/23/2014   BILITOT 0.5 11/23/2014   ALKPHOS 57 11/23/2014   AST 58* 11/23/2014   ALT 65* 11/23/2014   PROT 7.6 11/23/2014   ALBUMIN 4.2 11/23/2014   CALCIUM 10.5 11/23/2014   ANIONGAP 7 10/21/2014   EGFR 54* 10/21/2014   GFR 46.11* 11/23/2014   Lab Results  Component Value Date   CHOL 202*  11/23/2014   Lab Results  Component Value Date   HDL 50.40 11/23/2014   Lab Results  Component Value Date   LDLCALC * 01/31/2007    117        Total Cholesterol/HDL:CHD Risk Coronary Heart Disease Risk Table                     Men   Women  1/2 Average Risk   3.4   3.3  Average Risk       5.0   4.4  2 X Average Risk   9.6   7.1  3 X Average Risk  23.4   11.0        Use the calculated Patient Ratio above and the CHD Risk Table to determine the patient's CHD Risk.        ATP III CLASSIFICATION (LDL):  <100     mg/dL   Optimal  100-129  mg/dL   Near or Above                    Optimal  130-159  mg/dL   Borderline  160-189  mg/dL   High  >190     mg/dL   Very High   Lab Results  Component Value Date   TRIG 306.0* 11/23/2014   Lab Results  Component Value Date   CHOLHDL 4 11/23/2014   No results found for: HGBA1C  Assessment & Plan:   Problem List Items Addressed This Visit    Pain in the abdomen   Relevant Orders   TSH (Completed)   CBC (Completed)   Comprehensive metabolic panel (Completed)   Lipid panel (Completed)   Osteopenia     Recommend calcium intake of 1200 to 1500 mg daily, divided into roughly 3 doses. Best source is the diet and a single dairy serving is about 500 mg, a supplement of calcium citrate once or twice daily to balance diet is fine if not getting enough in diet. Also need Vitamin D 2000 IU caps, 1 cap daily if not already taking vitamin D. Also recommend weight baring exercise on hips and upper body to keep bones strong. Check vitamin D level      Obesity    Encouraged DASH diet, decrease po intake and increase exercise as tolerated. Needs 7-8 hours of sleep nightly. Avoid trans fats, eat small, frequent meals every 4-5 hours with lean proteins, complex carbs and healthy fats. Minimize simple carbs, GMO foods.      Relevant Orders   TSH (Completed)   CBC (Completed)   Comprehensive metabolic panel (Completed)   Lipid panel  (Completed)   Migraine    Describes h/o neurologic migraines with perioral and finger tip numbness as well as a sense of thickening of tongue. No symptoms recently      Relevant Orders   TSH (Completed)   CBC (Completed)   Comprehensive metabolic panel (Completed)   Lipid panel (Completed)   Low serum vitamin D    Start 50000 IU weekly and maintain daily supplements      Hyperlipidemia    Encouraged heart healthy diet, increase exercise, avoid trans fats, consider a krill oil cap daily      Hx of cancer of lung    2008 first dx, s/p wedge resection of left lung then 2014 recurrent cx s/p second wedge resection      Relevant Orders   TSH (Completed)   CBC (Completed)   Comprehensive metabolic panel (Completed)   Lipid panel (Completed)   History of chicken pox   Relevant Orders   TSH (Completed)   CBC (Completed)   Comprehensive metabolic panel (Completed)   Lipid panel (Completed)   GERD (gastroesophageal reflux disease)    Avoid offending foods, start probiotics. Do not eat large meals in late evening and consider raising head of bed.       Relevant Medications   meclizine (ANTIVERT) 12.5 MG tablet   Dyspnea    Reports worsening. Referred to pulmonology for further consideration      DJD (degenerative joint disease)   Relevant Orders   TSH (Completed)   CBC (Completed)   Comprehensive metabolic panel (Completed)   Lipid panel (Completed)   Cancer of kidney ([place]) - Primary    Follows with Dr Roxy Cedar and is doing well, will follow along with renal functions and symptoms      Relevant Medications   meclizine (ANTIVERT) 12.5 MG tablet   Other Relevant Orders   TSH (Completed)   CBC (Completed)   Comprehensive metabolic panel (Completed)   Lipid panel (Completed)   Benign paroxysmal positional vertigo   Relevant Orders   TSH (Completed)   CBC (Completed)   Comprehensive metabolic panel (Completed)   Lipid panel (Completed)    Other Visit Diagnoses     Vitamin D deficiency        Relevant Orders    Vitamin D (25 hydroxy) (Completed)  Breast cancer screening        Relevant Orders    MM SCREENING BREAST TOMO BILATERAL    DOE (dyspnea on exertion)        Relevant Orders    Ambulatory referral to Pulmonology       I am having Ms. Feazell maintain her fish oil-omega-3 fatty acids, diphenhydramine-acetaminophen, colchicine, glucosamine-chondroitin, Biotin, calcium carbonate, phenazopyridine, and meclizine.  Meds ordered this encounter  Medications  . phenazopyridine (PYRIDIUM) 100 MG tablet    Sig: Take 100 mg by mouth 3 (three) times daily as needed for pain.  Marland Kitchen DISCONTD: meclizine (ANTIVERT) 12.5 MG tablet    Sig: Take 1 tablet (12.5 mg total) by mouth 3 (three) times daily as needed for dizziness. As needed for   Dizziness or nausea    Dispense:  30 tablet    Refill:  1  . DISCONTD: meclizine (ANTIVERT) 12.5 MG tablet    Sig: Take 1 tablet (12.5 mg total) by mouth 3 (three) times daily as needed for dizziness. As needed for   Dizziness or nausea    Dispense:  30 tablet    Refill:  1  . meclizine (ANTIVERT) 12.5 MG tablet    Sig: Take 1 tablet (12.5 mg total) by mouth 3 (three) times daily as needed for dizziness. As needed for   Dizziness or nausea    Dispense:  30 tablet    Refill:  1     Penni Homans, MD

## 2014-11-23 NOTE — Progress Notes (Signed)
Pre visit review using our clinic review tool, if applicable. No additional management support is needed unless otherwise documented below in the visit note. 

## 2014-11-23 NOTE — Patient Instructions (Addendum)
Consider Shingles shot call insurance to see if they will pay for the shinges    Preventive Care for Adults, Female A healthy lifestyle and preventive care can promote health and wellness. Preventive health guidelines for women include the following key practices.  A routine yearly physical is a good way to check with your health care provider about your health and preventive screening. It is a chance to share any concerns and updates on your health and to receive a thorough exam.  Visit your dentist for a routine exam and preventive care every 6 months. Brush your teeth twice a day and floss once a day. Good oral hygiene prevents tooth decay and gum disease.  The frequency of eye exams is based on your age, health, family medical history, use of contact lenses, and other factors. Follow your health care provider's recommendations for frequency of eye exams.  Eat a healthy diet. Foods like vegetables, fruits, whole grains, low-fat dairy products, and lean protein foods contain the nutrients you need without too many calories. Decrease your intake of foods high in solid fats, added sugars, and salt. Eat the right amount of calories for you.Get information about a proper diet from your health care provider, if necessary.  Regular physical exercise is one of the most important things you can do for your health. Most adults should get at least 150 minutes of moderate-intensity exercise (any activity that increases your heart rate and causes you to sweat) each week. In addition, most adults need muscle-strengthening exercises on 2 or more days a week.  Maintain a healthy weight. The body mass index (BMI) is a screening tool to identify possible weight problems. It provides an estimate of body fat based on height and weight. Your health care provider can find your BMI and can help you achieve or maintain a healthy weight.For adults 20 years and older:  A BMI below 18.5 is considered underweight.  A  BMI of 18.5 to 24.9 is normal.  A BMI of 25 to 29.9 is considered overweight.  A BMI of 30 and above is considered obese.  Maintain normal blood lipids and cholesterol levels by exercising and minimizing your intake of saturated fat. Eat a balanced diet with plenty of fruit and vegetables. Blood tests for lipids and cholesterol should begin at age 17 and be repeated every 5 years. If your lipid or cholesterol levels are high, you are over 50, or you are at high risk for heart disease, you may need your cholesterol levels checked more frequently.Ongoing high lipid and cholesterol levels should be treated with medicines if diet and exercise are not working.  If you smoke, find out from your health care provider how to quit. If you do not use tobacco, do not start.  Lung cancer screening is recommended for adults aged 46-80 years who are at high risk for developing lung cancer because of a history of smoking. A yearly low-dose CT scan of the lungs is recommended for people who have at least a 30-pack-year history of smoking and are a current smoker or have quit within the past 15 years. A pack year of smoking is smoking an average of 1 pack of cigarettes a day for 1 year (for example: 1 pack a day for 30 years or 2 packs a day for 15 years). Yearly screening should continue until the smoker has stopped smoking for at least 15 years. Yearly screening should be stopped for people who develop a health problem that would prevent them  from having lung cancer treatment.  If you are pregnant, do not drink alcohol. If you are breastfeeding, be very cautious about drinking alcohol. If you are not pregnant and choose to drink alcohol, do not have more than 1 drink per day. One drink is considered to be 12 ounces (355 mL) of beer, 5 ounces (148 mL) of wine, or 1.5 ounces (44 mL) of liquor.  Avoid use of street drugs. Do not share needles with anyone. Ask for help if you need support or instructions about stopping  the use of drugs.  High blood pressure causes heart disease and increases the risk of stroke. Your blood pressure should be checked at least every 1 to 2 years. Ongoing high blood pressure should be treated with medicines if weight loss and exercise do not work.  If you are 52-87 years old, ask your health care provider if you should take aspirin to prevent strokes.  Diabetes screening is done by taking a blood sample to check your blood glucose level after you have not eaten for a certain period of time (fasting). If you are not overweight and you do not have risk factors for diabetes, you should be screened once every 3 years starting at age 19. If you are overweight or obese and you are 54-89 years of age, you should be screened for diabetes every year as part of your cardiovascular risk assessment.  Breast cancer screening is essential preventive care for women. You should practice "breast self-awareness." This means understanding the normal appearance and feel of your breasts and may include breast self-examination. Any changes detected, no matter how small, should be reported to a health care provider. Women in their 41s and 30s should have a clinical breast exam (CBE) by a health care provider as part of a regular health exam every 1 to 3 years. After age 76, women should have a CBE every year. Starting at age 63, women should consider having a mammogram (breast X-ray test) every year. Women who have a family history of breast cancer should talk to their health care provider about genetic screening. Women at a high risk of breast cancer should talk to their health care providers about having an MRI and a mammogram every year.  Breast cancer gene (BRCA)-related cancer risk assessment is recommended for women who have family members with BRCA-related cancers. BRCA-related cancers include breast, ovarian, tubal, and peritoneal cancers. Having family members with these cancers may be associated with an  increased risk for harmful changes (mutations) in the breast cancer genes BRCA1 and BRCA2. Results of the assessment will determine the need for genetic counseling and BRCA1 and BRCA2 testing.  Your health care provider may recommend that you be screened regularly for cancer of the pelvic organs (ovaries, uterus, and vagina). This screening involves a pelvic examination, including checking for microscopic changes to the surface of your cervix (Pap test). You may be encouraged to have this screening done every 3 years, beginning at age 82.  For women ages 61-65, health care providers may recommend pelvic exams and Pap testing every 3 years, or they may recommend the Pap and pelvic exam, combined with testing for human papilloma virus (HPV), every 5 years. Some types of HPV increase your risk of cervical cancer. Testing for HPV may also be done on women of any age with unclear Pap test results.  Other health care providers may not recommend any screening for nonpregnant women who are considered low risk for pelvic cancer and who  do not have symptoms. Ask your health care provider if a screening pelvic exam is right for you.  If you have had past treatment for cervical cancer or a condition that could lead to cancer, you need Pap tests and screening for cancer for at least 20 years after your treatment. If Pap tests have been discontinued, your risk factors (such as having a new sexual partner) need to be reassessed to determine if screening should resume. Some women have medical problems that increase the chance of getting cervical cancer. In these cases, your health care provider may recommend more frequent screening and Pap tests.  Colorectal cancer can be detected and often prevented. Most routine colorectal cancer screening begins at the age of 43 years and continues through age 50 years. However, your health care provider may recommend screening at an earlier age if you have risk factors for colon  cancer. On a yearly basis, your health care provider may provide home test kits to check for hidden blood in the stool. Use of a small camera at the end of a tube, to directly examine the colon (sigmoidoscopy or colonoscopy), can detect the earliest forms of colorectal cancer. Talk to your health care provider about this at age 68, when routine screening begins. Direct exam of the colon should be repeated every 5-10 years through age 32 years, unless early forms of precancerous polyps or small growths are found.  People who are at an increased risk for hepatitis B should be screened for this virus. You are considered at high risk for hepatitis B if:  You were born in a country where hepatitis B occurs often. Talk with your health care provider about which countries are considered high risk.  Your parents were born in a high-risk country and you have not received a shot to protect against hepatitis B (hepatitis B vaccine).  You have HIV or AIDS.  You use needles to inject street drugs.  You live with, or have sex with, someone who has hepatitis B.  You get hemodialysis treatment.  You take certain medicines for conditions like cancer, organ transplantation, and autoimmune conditions.  Hepatitis C blood testing is recommended for all people born from [birth year] through 1965 and any individual with known risks for hepatitis C.  Practice safe sex. Use condoms and avoid high-risk sexual practices to reduce the spread of sexually transmitted infections (STIs). STIs include gonorrhea, chlamydia, syphilis, trichomonas, herpes, HPV, and human immunodeficiency virus (HIV). Herpes, HIV, and HPV are viral illnesses that have no cure. They can result in disability, cancer, and death.  You should be screened for sexually transmitted illnesses (STIs) including gonorrhea and chlamydia if:  You are sexually active and are younger than 24 years.  You are older than 24 years and your health care provider tells you  that you are at risk for this type of infection.  Your sexual activity has changed since you were last screened and you are at an increased risk for chlamydia or gonorrhea. Ask your health care provider if you are at risk.  If you are at risk of being infected with HIV, it is recommended that you take a prescription medicine daily to prevent HIV infection. This is called preexposure prophylaxis (PrEP). You are considered at risk if:  You are sexually active and do not regularly use condoms or know the HIV status of your partner(s).  You take drugs by injection.  You are sexually active with a partner who has HIV.  Talk with  your health care provider about whether you are at high risk of being infected with HIV. If you choose to begin PrEP, you should first be tested for HIV. You should then be tested every 3 months for as long as you are taking PrEP.  Osteoporosis is a disease in which the bones lose minerals and strength with aging. This can result in serious bone fractures or breaks. The risk of osteoporosis can be identified using a bone density scan. Women ages 69 years and over and women at risk for fractures or osteoporosis should discuss screening with their health care providers. Ask your health care provider whether you should take a calcium supplement or vitamin D to reduce the rate of osteoporosis.  Menopause can be associated with physical symptoms and risks. Hormone replacement therapy is available to decrease symptoms and risks. You should talk to your health care provider about whether hormone replacement therapy is right for you.  Use sunscreen. Apply sunscreen liberally and repeatedly throughout the day. You should seek shade when your shadow is shorter than you. Protect yourself by wearing long sleeves, pants, a wide-brimmed hat, and sunglasses year round, whenever you are outdoors.  Once a month, do a whole body skin exam, using a mirror to look at the skin on your back. Tell  your health care provider of new moles, moles that have irregular borders, moles that are larger than a pencil eraser, or moles that have changed in shape or color.  Stay current with required vaccines (immunizations).  Influenza vaccine. All adults should be immunized every year.  Tetanus, diphtheria, and acellular pertussis (Td, Tdap) vaccine. Pregnant women should receive 1 dose of Tdap vaccine during each pregnancy. The dose should be obtained regardless of the length of time since the last dose. Immunization is preferred during the 27th-36th week of gestation. An adult who has not previously received Tdap or who does not know her vaccine status should receive 1 dose of Tdap. This initial dose should be followed by tetanus and diphtheria toxoids (Td) booster doses every 10 years. Adults with an unknown or incomplete history of completing a 3-dose immunization series with Td-containing vaccines should begin or complete a primary immunization series including a Tdap dose. Adults should receive a Td booster every 10 years.  Varicella vaccine. An adult without evidence of immunity to varicella should receive 2 doses or a second dose if she has previously received 1 dose. Pregnant females who do not have evidence of immunity should receive the first dose after pregnancy. This first dose should be obtained before leaving the health care facility. The second dose should be obtained 4-8 weeks after the first dose.  Human papillomavirus (HPV) vaccine. Females aged 13-26 years who have not received the vaccine previously should obtain the 3-dose series. The vaccine is not recommended for use in pregnant females. However, pregnancy testing is not needed before receiving a dose. If a female is found to be pregnant after receiving a dose, no treatment is needed. In that case, the remaining doses should be delayed until after the pregnancy. Immunization is recommended for any person with an immunocompromised  condition through the age of 55 years if she did not get any or all doses earlier. During the 3-dose series, the second dose should be obtained 4-8 weeks after the first dose. The third dose should be obtained 24 weeks after the first dose and 16 weeks after the second dose.  Zoster vaccine. One dose is recommended for adults aged 24  years or older unless certain conditions are present.  Measles, mumps, and rubella (MMR) vaccine. Adults born before 60 generally are considered immune to measles and mumps. Adults born in 1 or later should have 1 or more doses of MMR vaccine unless there is a contraindication to the vaccine or there is laboratory evidence of immunity to each of the three diseases. A routine second dose of MMR vaccine should be obtained at least 28 days after the first dose for students attending postsecondary schools, health care workers, or international travelers. People who received inactivated measles vaccine or an unknown type of measles vaccine during 1963-1967 should receive 2 doses of MMR vaccine. People who received inactivated mumps vaccine or an unknown type of mumps vaccine before 1979 and are at high risk for mumps infection should consider immunization with 2 doses of MMR vaccine. For females of childbearing age, rubella immunity should be determined. If there is no evidence of immunity, females who are not pregnant should be vaccinated. If there is no evidence of immunity, females who are pregnant should delay immunization until after pregnancy. Unvaccinated health care workers born before [birth year] who lack laboratory evidence of measles, mumps, or rubella immunity or laboratory confirmation of disease should consider measles and mumps immunization with 2 doses of MMR vaccine or rubella immunization with 1 dose of MMR vaccine.  Pneumococcal 13-valent conjugate (PCV13) vaccine. When indicated, a person who is uncertain of his immunization history and has no record of immunization  should receive the PCV13 vaccine. All adults 31 years of age and older should receive this vaccine. An adult aged 67 years or older who has certain medical conditions and has not been previously immunized should receive 1 dose of PCV13 vaccine. This PCV13 should be followed with a dose of pneumococcal polysaccharide (PPSV23) vaccine. Adults who are at high risk for pneumococcal disease should obtain the PPSV23 vaccine at least 8 weeks after the dose of PCV13 vaccine. Adults older than 71 years of age who have normal immune system function should obtain the PPSV23 vaccine dose at least 1 year after the dose of PCV13 vaccine.  Pneumococcal polysaccharide (PPSV23) vaccine. When PCV13 is also indicated, PCV13 should be obtained first. All adults aged 21 years and older should be immunized. An adult younger than age 58 years who has certain medical conditions should be immunized. Any person who resides in a nursing home or long-term care facility should be immunized. An adult smoker should be immunized. People with an immunocompromised condition and certain other conditions should receive both PCV13 and PPSV23 vaccines. People with human immunodeficiency virus (HIV) infection should be immunized as soon as possible after diagnosis. Immunization during chemotherapy or radiation therapy should be avoided. Routine use of PPSV23 vaccine is not recommended for American Indians, [place] Natives, or people younger than 65 years unless there are medical conditions that require PPSV23 vaccine. When indicated, people who have unknown immunization and have no record of immunization should receive PPSV23 vaccine. One-time revaccination 5 years after the first dose of PPSV23 is recommended for people aged 19-64 years who have chronic kidney failure, nephrotic syndrome, asplenia, or immunocompromised conditions. People who received 1-2 doses of PPSV23 before age 80 years should receive another dose of PPSV23 vaccine at age 39 years  or later if at least 5 years have passed since the previous dose. Doses of PPSV23 are not needed for people immunized with PPSV23 at or after age 6 years.  Meningococcal vaccine. Adults with asplenia or persistent complement  component deficiencies should receive 2 doses of quadrivalent meningococcal conjugate (MenACWY-D) vaccine. The doses should be obtained at least 2 months apart. Microbiologists working with certain meningococcal bacteria, [place] recruits, people at risk during an outbreak, and people who travel to or live in countries with a high rate of meningitis should be immunized. A first-year college student up through age 51 years who is living in a residence hall should receive a dose if she did not receive a dose on or after her 16th birthday. Adults who have certain high-risk conditions should receive one or more doses of vaccine.  Hepatitis A vaccine. Adults who wish to be protected from this disease, have certain high-risk conditions, work with hepatitis A-infected animals, work in hepatitis A research labs, or travel to or work in countries with a high rate of hepatitis A should be immunized. Adults who were previously unvaccinated and who anticipate close contact with an international adoptee during the first 60 days after arrival in the [place] from a country with a high rate of hepatitis A should be immunized.  Hepatitis B vaccine. Adults who wish to be protected from this disease, have certain high-risk conditions, may be exposed to blood or other infectious body fluids, are household contacts or sex partners of hepatitis B positive people, are clients or workers in certain care facilities, or travel to or work in countries with a high rate of hepatitis B should be immunized.  Haemophilus influenzae type b (Hib) vaccine. A previously unvaccinated person with asplenia or sickle cell disease or having a scheduled splenectomy should receive 1 dose of Hib vaccine. Regardless of  previous immunization, a recipient of a hematopoietic stem cell transplant should receive a 3-dose series 6-12 months after her successful transplant. Hib vaccine is not recommended for adults with HIV infection. Preventive Services / Frequency Ages 51 to 40 years  Blood pressure check.** / Every 3-5 years.  Lipid and cholesterol check.** / Every 5 years beginning at age 69.  Clinical breast exam.** / Every 3 years for women in their 47s and 87s.  BRCA-related cancer risk assessment.** / For women who have family members with a BRCA-related cancer (breast, ovarian, tubal, or peritoneal cancers).  Pap test.** / Every 2 years from ages 62 through 2. Every 3 years starting at age 33 through age 60 or 101 with a history of 3 consecutive normal Pap tests.  HPV screening.** / Every 3 years from ages 65 through ages 62 to 46 with a history of 3 consecutive normal Pap tests.  Hepatitis C blood test.** / For any individual with known risks for hepatitis C.  Skin self-exam. / Monthly.  Influenza vaccine. / Every year.  Tetanus, diphtheria, and acellular pertussis (Tdap, Td) vaccine.** / Consult your health care provider. Pregnant women should receive 1 dose of Tdap vaccine during each pregnancy. 1 dose of Td every 10 years.  Varicella vaccine.** / Consult your health care provider. Pregnant females who do not have evidence of immunity should receive the first dose after pregnancy.  HPV vaccine. / 3 doses over 6 months, if 63 and younger. The vaccine is not recommended for use in pregnant females. However, pregnancy testing is not needed before receiving a dose.  Measles, mumps, rubella (MMR) vaccine.** / You need at least 1 dose of MMR if you were born in 1957 or later. You may also need a 2nd dose. For females of childbearing age, rubella immunity should be determined. If there is no evidence of immunity, females  who are not pregnant should be vaccinated. If there is no evidence of immunity,  females who are pregnant should delay immunization until after pregnancy.  Pneumococcal 13-valent conjugate (PCV13) vaccine.** / Consult your health care provider.  Pneumococcal polysaccharide (PPSV23) vaccine.** / 1 to 2 doses if you smoke cigarettes or if you have certain conditions.  Meningococcal vaccine.** / 1 dose if you are age 50 to 36 years and a Market researcher living in a residence hall, or have one of several medical conditions, you need to get vaccinated against meningococcal disease. You may also need additional booster doses.  Hepatitis A vaccine.** / Consult your health care provider.  Hepatitis B vaccine.** / Consult your health care provider.  Haemophilus influenzae type b (Hib) vaccine.** / Consult your health care provider. Ages 20 to 25 years  Blood pressure check.** / Every year.  Lipid and cholesterol check.** / Every 5 years beginning at age 4 years.  Lung cancer screening. / Every year if you are aged 59-80 years and have a 30-pack-year history of smoking and currently smoke or have quit within the past 15 years. Yearly screening is stopped once you have quit smoking for at least 15 years or develop a health problem that would prevent you from having lung cancer treatment.  Clinical breast exam.** / Every year after age 59 years.  BRCA-related cancer risk assessment.** / For women who have family members with a BRCA-related cancer (breast, ovarian, tubal, or peritoneal cancers).  Mammogram.** / Every year beginning at age 33 years and continuing for as long as you are in good health. Consult with your health care provider.  Pap test.** / Every 3 years starting at age 52 years through age 37 or 69 years with a history of 3 consecutive normal Pap tests.  HPV screening.** / Every 3 years from ages 16 years through ages 55 to 46 years with a history of 3 consecutive normal Pap tests.  Fecal occult blood test (FOBT) of stool. / Every year beginning at  age 71 years and continuing until age 20 years. You may not need to do this test if you get a colonoscopy every 10 years.  Flexible sigmoidoscopy or colonoscopy.** / Every 5 years for a flexible sigmoidoscopy or every 10 years for a colonoscopy beginning at age 41 years and continuing until age 74 years.  Hepatitis C blood test.** / For all people born from [birth year] through 1965 and any individual with known risks for hepatitis C.  Skin self-exam. / Monthly.  Influenza vaccine. / Every year.  Tetanus, diphtheria, and acellular pertussis (Tdap/Td) vaccine.** / Consult your health care provider. Pregnant women should receive 1 dose of Tdap vaccine during each pregnancy. 1 dose of Td every 10 years.  Varicella vaccine.** / Consult your health care provider. Pregnant females who do not have evidence of immunity should receive the first dose after pregnancy.  Zoster vaccine.** / 1 dose for adults aged 82 years or older.  Measles, mumps, rubella (MMR) vaccine.** / You need at least 1 dose of MMR if you were born in 1957 or later. You may also need a second dose. For females of childbearing age, rubella immunity should be determined. If there is no evidence of immunity, females who are not pregnant should be vaccinated. If there is no evidence of immunity, females who are pregnant should delay immunization until after pregnancy.  Pneumococcal 13-valent conjugate (PCV13) vaccine.** / Consult your health care provider.  Pneumococcal polysaccharide (PPSV23) vaccine.** / 1  to 2 doses if you smoke cigarettes or if you have certain conditions.  Meningococcal vaccine.** / Consult your health care provider.  Hepatitis A vaccine.** / Consult your health care provider.  Hepatitis B vaccine.** / Consult your health care provider.  Haemophilus influenzae type b (Hib) vaccine.** / Consult your health care provider. Ages 82 years and over  Blood pressure check.** / Every year.  Lipid and cholesterol check.**  / Every 5 years beginning at age 56 years.  Lung cancer screening. / Every year if you are aged 4-80 years and have a 30-pack-year history of smoking and currently smoke or have quit within the past 15 years. Yearly screening is stopped once you have quit smoking for at least 15 years or develop a health problem that would prevent you from having lung cancer treatment.  Clinical breast exam.** / Every year after age 98 years.  BRCA-related cancer risk assessment.** / For women who have family members with a BRCA-related cancer (breast, ovarian, tubal, or peritoneal cancers).  Mammogram.** / Every year beginning at age 40 years and continuing for as long as you are in good health. Consult with your health care provider.  Pap test.** / Every 3 years starting at age 85 years through age 30 or 56 years with 3 consecutive normal Pap tests. Testing can be stopped between 65 and 70 years with 3 consecutive normal Pap tests and no abnormal Pap or HPV tests in the past 10 years.  HPV screening.** / Every 3 years from ages 30 years through ages 69 or 80 years with a history of 3 consecutive normal Pap tests. Testing can be stopped between 65 and 70 years with 3 consecutive normal Pap tests and no abnormal Pap or HPV tests in the past 10 years.  Fecal occult blood test (FOBT) of stool. / Every year beginning at age 60 years and continuing until age 20 years. You may not need to do this test if you get a colonoscopy every 10 years.  Flexible sigmoidoscopy or colonoscopy.** / Every 5 years for a flexible sigmoidoscopy or every 10 years for a colonoscopy beginning at age 70 years and continuing until age 63 years.  Hepatitis C blood test.** / For all people born from [birth year] through 1967 and any individual with known risks for hepatitis C.  Osteoporosis screening.** / A one-time screening for women ages 24 years and over and women at risk for fractures or osteoporosis.  Skin self-exam. / Monthly.  Influenza  vaccine. / Every year.  Tetanus, diphtheria, and acellular pertussis (Tdap/Td) vaccine.** / 1 dose of Td every 10 years.  Varicella vaccine.** / Consult your health care provider.  Zoster vaccine.** / 1 dose for adults aged 55 years or older.  Pneumococcal 13-valent conjugate (PCV13) vaccine.** / Consult your health care provider.  Pneumococcal polysaccharide (PPSV23) vaccine.** / 1 dose for all adults aged 56 years and older.  Meningococcal vaccine.** / Consult your health care provider.  Hepatitis A vaccine.** / Consult your health care provider.  Hepatitis B vaccine.** / Consult your health care provider.  Haemophilus influenzae type b (Hib) vaccine.** / Consult your health care provider. ** Family history and personal history of risk and conditions may change your health care provider's recommendations.   This information is not intended to replace advice given to you by your health care provider. Make sure you discuss any questions you have with your health care provider.   Document Released: 03/14/2001 Document Revised: 02/06/2014 Document Reviewed: 06/13/2010 Elsevier Interactive  Patient Education 2016 [employer].

## 2014-11-24 LAB — COMPREHENSIVE METABOLIC PANEL
ALBUMIN: 4.2 g/dL (ref 3.5–5.2)
ALT: 65 U/L — AB (ref 0–35)
AST: 58 U/L — AB (ref 0–37)
Alkaline Phosphatase: 57 U/L (ref 39–117)
BUN: 18 mg/dL (ref 6–23)
CHLORIDE: 106 meq/L (ref 96–112)
CO2: 26 meq/L (ref 19–32)
CREATININE: 1.22 mg/dL — AB (ref 0.40–1.20)
Calcium: 10.5 mg/dL (ref 8.4–10.5)
GFR: 46.11 mL/min — ABNORMAL LOW (ref >60.00)
Glucose, Bld: 81 mg/dL (ref 70–99)
Potassium: 4.8 mEq/L (ref 3.5–5.1)
SODIUM: 143 meq/L (ref 135–145)
Total Bilirubin: 0.5 mg/dL (ref 0.2–1.2)
Total Protein: 7.6 g/dL (ref 6.0–8.3)

## 2014-11-24 LAB — LIPID PANEL
CHOL/HDL RATIO: 4
Cholesterol: 202 mg/dL — ABNORMAL HIGH (ref 0–200)
HDL: 50.4 mg/dL (ref >39.00)
NONHDL: 151.68
Triglycerides: 306 mg/dL — ABNORMAL HIGH (ref 0.0–149.0)
VLDL: 61.2 mg/dL — ABNORMAL HIGH (ref 0.0–40.0)

## 2014-11-24 LAB — CBC
HCT: 43.2 % (ref 36.0–46.0)
Hemoglobin: 14.4 g/dL (ref 12.0–15.0)
MCHC: 33.4 g/dL (ref 30.0–36.0)
MCV: 91.1 fl (ref 78.0–100.0)
Platelets: 345 10*3/uL (ref 150.0–400.0)
RBC: 4.74 Mil/uL (ref 3.87–5.11)
RDW: 12.9 % (ref 11.5–15.5)
WBC: 13.7 10*3/uL — ABNORMAL HIGH (ref 4.0–10.5)

## 2014-11-24 LAB — LDL CHOLESTEROL, DIRECT: Direct LDL: 110 mg/dL

## 2014-11-24 LAB — TSH: TSH: 1.64 u[IU]/mL (ref 0.35–4.50)

## 2014-11-24 LAB — VITAMIN D 25 HYDROXY (VIT D DEFICIENCY, FRACTURES): VITD: 22.74 ng/mL — ABNORMAL LOW (ref 30.00–100.00)

## 2014-11-25 ENCOUNTER — Other Ambulatory Visit: Payer: Self-pay | Admitting: Family Medicine

## 2014-11-25 DIAGNOSIS — E559 Vitamin D deficiency, unspecified: Secondary | ICD-10-CM

## 2014-11-25 DIAGNOSIS — E785 Hyperlipidemia, unspecified: Secondary | ICD-10-CM

## 2014-11-25 DIAGNOSIS — D72829 Elevated white blood cell count, unspecified: Secondary | ICD-10-CM

## 2014-11-25 MED ORDER — VITAMIN D (ERGOCALCIFEROL) 1.25 MG (50000 UNIT) PO CAPS: 50000.0000 [IU] | ORAL_CAPSULE | ORAL | Status: DC

## 2014-11-26 ENCOUNTER — Other Ambulatory Visit (INDEPENDENT_AMBULATORY_CARE_PROVIDER_SITE_OTHER): Payer: Medicare Other

## 2014-11-26 DIAGNOSIS — D72829 Elevated white blood cell count, unspecified: Secondary | ICD-10-CM

## 2014-11-26 LAB — URINALYSIS, ROUTINE W REFLEX MICROSCOPIC
Bilirubin Urine: NEGATIVE
Hgb urine dipstick: NEGATIVE
Ketones, ur: NEGATIVE
NITRITE: NEGATIVE
RBC / HPF: NONE SEEN (ref 0–?)
Specific Gravity, Urine: 1.025 (ref 1.000–1.030)
Total Protein, Urine: NEGATIVE
URINE GLUCOSE: NEGATIVE
UROBILINOGEN UA: 0.2 (ref 0.0–1.0)
pH: 5.5 (ref 5.0–8.0)

## 2014-11-26 NOTE — Addendum Note (Signed)
Addended by: Harl Bowie on: 11/26/2014 11:26 AM   Modules accepted: Orders

## 2014-11-27 LAB — URINE CULTURE

## 2014-12-01 ENCOUNTER — Encounter: Payer: Self-pay | Admitting: Internal Medicine

## 2014-12-01 ENCOUNTER — Ambulatory Visit (INDEPENDENT_AMBULATORY_CARE_PROVIDER_SITE_OTHER): Payer: Medicare Other | Admitting: Internal Medicine

## 2014-12-01 VITALS — BP 142/82 | HR 46 | Ht 64.0 in | Wt 226.6 lb

## 2014-12-01 DIAGNOSIS — R0600 Dyspnea, unspecified: Secondary | ICD-10-CM | POA: Diagnosis not present

## 2014-12-01 DIAGNOSIS — E669 Obesity, unspecified: Secondary | ICD-10-CM | POA: Diagnosis not present

## 2014-12-01 NOTE — Progress Notes (Signed)
Subjective:     Patient ID: Amanda Crawford, female   DOB: 1945/09/27,    MRN: [id number]  HPI    1  yowf quit smoking 08/2006 at wt = 195 and 10/08 lingulectomy for metastatic renal cell cancer no chemo or rt but sob ever since.  [place]osp [place] 2008 for sob for 2 days no better at discharge for hbp not on rx, had left heart catheter with normal coronaries and normal pressures and LV function per Dr. Daneen Schick.   baseline = mop floor x 10 min, ok on level slow, but problems and steps    September 16, 2007 cough is a new complaint since started spiriva which didn't help the breathing, no change since = day > night, dry, no overt sinus or reflux co's rec stop spiriva and rx reflux as no evidence lung dz at wt 221   12/01/2014 "new pt" eval / [facility]  Chief Complaint  Patient presents with  . Pulmonary Consult    Pt is self-referred, Pt c/o dyspnea with even minimal exertion for several months that seems to be gradually worsening. Pt did find some mold in her house so is concerned about exposure. Pt has hx of lung ca.    Indolent onset progressive doe = ok doing HT slow End of driveway uphill to mailbox and then uphill back into house up to landing since summer 2016  Doe = MMRC2 = can't walk a nl pace on a flat grade s sob  No obvious day to day or daytime variability or assoc chronic cough or cp or chest tightness, subjective wheeze or overt sinus or hb symptoms. No unusual exp hx or h/o childhood pna/ asthma or knowledge of premature birth.  Sleeping ok without nocturnal  or early am exacerbation  of respiratory  c/o's or need for noct saba. Also denies any obvious fluctuation of symptoms with weather or environmental changes or other aggravating or alleviating factors except as outlined above   Current Medications, Allergies, Complete Past Medical History, Past Surgical History, Family History, and Social History were reviewed in [facility] record.  ROS  The  following are not active complaints unless bolded sore throat, dysphagia, dental problems, itching, sneezing,  nasal congestion or excess/ purulent secretions, ear ache,   fever, chills, sweats, unintended wt loss, classically pleuritic or exertional cp, hemoptysis,  orthopnea pnd or leg swelling, presyncope, palpitations, abdominal pain, anorexia, nausea, vomiting, diarrhea  or change in bowel or bladder habits, change in stools or urine, dysuria,hematuria,  rash, arthralgias, visual complaints, headache, numbness, weakness or ataxia or problems with walking or coordination,  change in mood/affect or memory.         Review of Systems     Objective:   Physical Exam Obese amb wf nad  Wt Readings from Last 3 Encounters:  12/01/14 226 lb 9.6 oz (102.785 kg)  11/23/14 227 lb 4 oz (103.08 kg)  11/19/14 224 lb 3.2 oz (101.696 kg)    Vital signs reviewed   HEENT: nl dentition, turbinates, and oropharynx. Nl external ear canals without cough reflex   NECK :  without JVD/Nodes/TM/ nl carotid upstrokes bilaterally   LUNGS: no acc muscle use, clear to A and P bilaterally without cough on insp or exp maneuvers   CV:  RRR  no s3 or murmur or increase in P2, no edema   ABD:  soft and nontender with nl excursion in the supine position. No bruits or organomegaly, bowel sounds nl  MS:  warm without deformities, calf tenderness, cyanosis or clubbing  SKIN: warm and dry without lesions    NEURO:  alert, approp, no deficits   CT with contrast 9/2/116 1. Surgical changes involving the left hemi thorax but no findings for recurrent tumor or metastatic disease or lymphadenopathy. 2. No acute pulmonary findings. 3. Surgical changes from a left nephrectomy. No findings for recurrent tumor, adenopathy or metastatic disease.   Labs reviewed 11/23/14 Nl hc03, nl hgb Lab Results  Component Value Date   TSH 1.64 11/23/2014         Assessment:

## 2014-12-01 NOTE — Patient Instructions (Signed)
Try prilosec otc '20mg'$   Take 30-60 min before first meal of the day and Pepcid ac (famotidine) 20 mg one @  bedtime until return   GERD (REFLUX)  is an extremely common cause of respiratory symptoms just like yours , many times with no obvious heartburn at all.    It can be treated with medication, but also with lifestyle changes including elevation of the head of your bed (ideally with 6 inch  bed blocks),  Smoking cessation, avoidance of late meals, excessive alcohol, and avoid fatty foods, chocolate, peppermint, colas, red wine, and acidic juices such as orange juice.  NO MINT OR MENTHOL PRODUCTS SO NO COUGH DROPS  USE SUGARLESS CANDY INSTEAD (Jolley ranchers or Stover's or Life Savers) or even ice chips will also do - the key is to swallow to prevent all throat clearing. NO OIL BASED VITAMINS - use powdered substitutes.   Please schedule a follow up office visit in 6 weeks, call sooner if needed with pfts

## 2014-12-02 NOTE — Assessment & Plan Note (Addendum)
Body mass index is 38.88   Lab Results  Component Value Date   TSH 1.64 11/23/2014     Contributing to gerd tendency/ doe/reviewed the need and the process to achieve and maintain neg calorie balance > defer f/u primary care including intermittently monitoring thyroid status

## 2014-12-02 NOTE — Assessment & Plan Note (Addendum)
12/01/2014  Walked RA x 3 laps @ 185 ft each stopped due to  End of study, fast pace, no   desat  Min sob   Symptoms are  disproportionate to objective findings and not clear this is a lung problem but pt does appear to have difficult airway management issues. DDX of  difficult airways management all start with A and  include Adherence, Ace Inhibitors, Acid Reflux, Active Sinus Disease, Alpha 1 Antitripsin deficiency, Anxiety masquerading as Airways dz,  ABPA,  allergy(esp in young), Aspiration (esp in elderly), Adverse effects of meds,  Active smokers, A bunch of PE's (a small clot burden can't cause this syndrome unless there is already severe underlying pulm or vascular dz with poor reserve) plus two Bs  = Bronchiectasis and Beta blocker use..and one C= CHF  Adherence is always the initial "prime suspect" and is a multilayered concern that requires a "trust but verify" approach in every patient - starting with knowing how to use medications, especially inhalers, correctly, keeping up with refills and understanding the fundamental difference between maintenance and prns vs those medications only taken for a very short course and then stopped and not refilled.   ? Acid (or non-acid) GERD > always difficult to exclude as up to 75% of pts in some series report no assoc GI/ Heartburn symptom and note she had the same symptoms in 2009 which resolved on gerd rx > rec max (24h)  acid suppression and diet restrictions/ reviewed and instructions given in writing.   ? Anxiety related to obesity/ deconditioning > dx of exclusion  ? Allergy / asthma > very unlikely s cough   Will start with rx for gerd and deconditioning and return for pfts next and cpst to follow if not improving with reconditioning/ wt loss   I had an extended discussion with the patient reviewing all relevant studies completed to date and  lasting 35 minutes of a 19mnute visit    Each maintenance medication was reviewed in detail including  most importantly the difference between maintenance and prns and under what circumstances the prns are to be triggered using an action plan format that is not reflected in the computer generated alphabetically organized AVS.    Please see instructions for details which were reviewed in writing and the patient given a copy highlighting the part that I personally wrote and discussed at today's ov.

## 2014-12-03 ENCOUNTER — Ambulatory Visit (HOSPITAL_BASED_OUTPATIENT_CLINIC_OR_DEPARTMENT_OTHER)
Admission: RE | Admit: 2014-12-03 | Discharge: 2014-12-03 | Disposition: A | Discharge status: Home / Self Care | Payer: Medicare Other | Source: Ambulatory Visit | Attending: Family Medicine | Admitting: Family Medicine

## 2014-12-03 DIAGNOSIS — Z1231 Encounter for screening mammogram for malignant neoplasm of breast: Secondary | ICD-10-CM | POA: Insufficient documentation

## 2014-12-03 DIAGNOSIS — Z1239 Encounter for other screening for malignant neoplasm of breast: Secondary | ICD-10-CM

## 2014-12-06 NOTE — Assessment & Plan Note (Signed)
Avoid offending foods, start probiotics. Do not eat large meals in late evening and consider raising head of bed.  

## 2014-12-06 NOTE — Assessment & Plan Note (Signed)
Describes h/o neurologic migraines with perioral and finger tip numbness as well as a sense of thickening of tongue. No symptoms recently

## 2014-12-06 NOTE — Assessment & Plan Note (Signed)
Recommend calcium intake of 1200 to 1500 mg daily, divided into roughly 3 doses. Best source is the diet and a single dairy serving is about 500 mg, a supplement of calcium citrate once or twice daily to balance diet is fine if not getting enough in diet. Also need Vitamin D 2000 IU caps, 1 cap daily if not already taking vitamin D. Also recommend weight baring exercise on hips and upper body to keep bones strong. Check vitamin D level

## 2014-12-06 NOTE — Assessment & Plan Note (Signed)
Reports worsening. Referred to pulmonology for further consideration

## 2014-12-06 NOTE — Assessment & Plan Note (Signed)
Encouraged heart healthy diet, increase exercise, avoid trans fats, consider a krill oil cap daily 

## 2014-12-06 NOTE — Assessment & Plan Note (Signed)
Start 50000 IU weekly and maintain daily supplements

## 2014-12-06 NOTE — Assessment & Plan Note (Signed)
Encouraged DASH diet, decrease po intake and increase exercise as tolerated. Needs 7-8 hours of sleep nightly. Avoid trans fats, eat small, frequent meals every 4-5 hours with lean proteins, complex carbs and healthy fats. Minimize simple carbs, GMO foods. 

## 2014-12-06 NOTE — Assessment & Plan Note (Signed)
Follows with Dr Roxy Cedar and is doing well, will follow along with renal functions and symptoms

## 2015-01-20 ENCOUNTER — Encounter: Payer: Self-pay | Admitting: Internal Medicine

## 2015-01-20 ENCOUNTER — Ambulatory Visit (INDEPENDENT_AMBULATORY_CARE_PROVIDER_SITE_OTHER): Payer: Medicare Other | Admitting: Internal Medicine

## 2015-01-20 VITALS — BP 126/66 | HR 89 | Ht 64.0 in | Wt 223.0 lb

## 2015-01-20 DIAGNOSIS — R0600 Dyspnea, unspecified: Secondary | ICD-10-CM

## 2015-01-20 LAB — PULMONARY FUNCTION TEST
DL/VA % PRED: 82 %
DL/VA: 3.95 ml/min/mmHg/L
DLCO UNC: 14.33 ml/min/mmHg
DLCO unc % pred: 59 %
FEF 25-75 POST: 1.45 L/s
FEF 25-75 PRE: 1.25 L/s
FEF2575-%CHANGE-POST: 16 %
FEF2575-%PRED-POST: 79 %
FEF2575-%Pred-Pre: 68 %
FEV1-%CHANGE-POST: 3 %
FEV1-%PRED-PRE: 78 %
FEV1-%Pred-Post: 81 %
FEV1-POST: 1.8 L
FEV1-Pre: 1.73 L
FEV1FVC-%Change-Post: 2 %
FEV1FVC-%PRED-PRE: 98 %
FEV6-%Change-Post: 1 %
FEV6-%PRED-POST: 83 %
FEV6-%Pred-Pre: 82 %
FEV6-POST: 2.34 L
FEV6-Pre: 2.31 L
FEV6FVC-%PRED-POST: 105 %
FEV6FVC-%Pred-Pre: 105 %
FVC-%CHANGE-POST: 1 %
FVC-%PRED-POST: 79 %
FVC-%PRED-PRE: 78 %
FVC-PRE: 2.31 L
FVC-Post: 2.34 L
POST FEV1/FVC RATIO: 77 %
PRE FEV1/FVC RATIO: 75 %
PRE FEV6/FVC RATIO: 100 %
Post FEV6/FVC ratio: 100 %
RV % pred: 68 %
RV: 1.52 L
TLC % pred: 76 %
TLC: 3.86 L

## 2015-01-20 NOTE — Progress Notes (Signed)
Subjective:     Patient ID: Amanda Crawford, female   DOB: 06/27/1945,    MRN: [id number]      Brief patient profile:  25  yowf quit smoking 08/2006 at wt = 195 and 10/08 lingulectomy for metastatic renal cell cancer no chemo or rt but sob ever since.  [place] for sob for 2 days no better at discharge for hbp not on rx, had left heart catheter with normal coronaries and normal pressures and LV function per Dr. Daneen Schick.   baseline = mop floor x 10 min, ok on level slow, but problems and steps    September 16, 2007 cough is a new complaint since started spiriva which didn't help the breathing, no change since = day > night, dry, no overt sinus or reflux co's rec stop spiriva and rx reflux as no evidence lung dz at wt 221     12/01/2014 "new pt" eval / [facility]  Chief Complaint  Patient presents with  . Pulmonary Consult    Pt is self-referred, Pt c/o dyspnea with even minimal exertion for several months that seems to be gradually worsening. Pt did find some mold in her house so is concerned about exposure. Pt has hx of lung ca.   Indolent onset progressive doe = ok doing HT slow End of driveway uphill to mailbox and then uphill back into house up to landing since summer 2016  Doe = MMRC2 = can't walk a nl pace on a flat grade s sob rec Try prilosec otc '20mg'$   Take 30-60 min before first meal of the day and Pepcid ac (famotidine) 20 mg one @  bedtime until return  GERD diet     01/20/2015  f/u ov/Amanda Crawford re: unexplained sob with mild  restrictive changes with erv 37%  Chief Complaint  Patient presents with  . Follow-up    PFT done today. Her breathing has slightly improved. No new co's today.   Not limited by breathing from desired activities    No obvious day to day or daytime variability or assoc chronic cough or cp or chest tightness, subjective wheeze or overt sinus or hb symptoms. No unusual exp hx or h/o childhood pna/ asthma or knowledge of premature birth.  Sleeping ok  without nocturnal  or early am exacerbation  of respiratory  c/o's or need for noct saba. Also denies any obvious fluctuation of symptoms with weather or environmental changes or other aggravating or alleviating factors except as outlined above   Current Medications, Allergies, Complete Past Medical History, Past Surgical History, Family History, and Social History were reviewed in [employer] record.  ROS  The following are not active complaints unless bolded sore throat, dysphagia, dental problems, itching, sneezing,  nasal congestion or excess/ purulent secretions, ear ache,   fever, chills, sweats, unintended wt loss, classically pleuritic or exertional cp, hemoptysis,  orthopnea pnd or leg swelling, presyncope, palpitations, abdominal pain, anorexia, nausea, vomiting, diarrhea  or change in bowel or bladder habits, change in stools or urine, dysuria,hematuria,  rash, arthralgias, visual complaints, headache, numbness, weakness or ataxia or problems with walking or coordination,  change in mood/affect or memory.               Objective:   Physical Exam Obese amb wf nad  01/20/2015     223   12/01/14 226 lb 9.6 oz (102.785 kg)  11/23/14 227 lb 4 oz (103.08 kg)  11/19/14 224 lb 3.2 oz (101.696  kg)    Vital signs reviewed   HEENT: nl dentition, turbinates, and oropharynx. Nl external ear canals without cough reflex   NECK :  without JVD/Nodes/TM/ nl carotid upstrokes bilaterally   LUNGS: no acc muscle use, clear to A and P bilaterally without cough on insp or exp maneuvers   CV:  RRR  no s3 or murmur or increase in P2, no edema   ABD:  soft and nontender with nl excursion in the supine position. No bruits or organomegaly, bowel sounds nl  MS:  warm without deformities, calf tenderness, cyanosis or clubbing  SKIN: warm and dry without lesions    NEURO:  alert, approp, no deficits   CT with contrast 9/2/116 1. Surgical changes involving the left hemi  thorax but no findings for recurrent tumor or metastatic disease or lymphadenopathy. 2. No acute pulmonary findings. 3. Surgical changes from a left nephrectomy. No findings for recurrent tumor, adenopathy or metastatic disease.   Labs reviewed 11/23/14 Nl hc03, nl hgb Lab Results  Component Value Date   TSH 1.64 11/23/2014         Assessment:

## 2015-01-20 NOTE — Patient Instructions (Addendum)
Weight control is simply a matter of calorie balance which needs to be tilted in your favor by eating less and exercising more.  To get the most out of exercise, you need to be continuously aware that you are short of breath, but never out of breath, for 30 minutes daily. As you improve, it will actually be easier for you to do the same amount of exercise  in  30 minutes so always push to the level where you are short of breath.  If this does not result in gradual weight reduction then I strongly recommend you see a nutritionist with a food diary x 2 weeks so that we can work out a negative calorie balance which is universally effective in steady weight loss programs.  Think of your calorie balance like you do your bank account where in this case you want the balance to go down so you must take in less calories than you burn up.  It's just that simple:  Hard to do, but easy to understand.  Good luck!    Ok to stop gerd meds if not convinced they help    If you are satisfied with your treatment plan,  let your doctor know and he/she can either refill your medications or you can return here when your prescription runs out.     If in any way you are not 100% satisfied,  please tell us.  If 100% better, tell your friends!  Pulmonary follow up is as needed

## 2015-01-20 NOTE — Assessment & Plan Note (Signed)
Complicated by  Hyperlipidemia/ DJD  Body mass index is 38.26    Lab Results  Component Value Date   TSH 1.64 11/23/2014     Contributing to gerd tendency/ doe/reviewed the need and the process to achieve and maintain neg calorie balance > defer f/u primary care including intermittently monitoring thyroid status

## 2015-01-20 NOTE — Assessment & Plan Note (Signed)
12/01/2014  Walked RA x 3 laps @ 185 ft each stopped due to  End of study, fast pace, no   desat  Min sob  - PFT's  01/20/2015  VC 2.34 (79%)  DLCO  59 % corrects to 82 % for alv volume  With erv 37%  I had an extended discussion with the patient reviewing all relevant studies completed to date and  lasting 15 to 20 minutes of a 25 minute visit   1) clearly this is pure restrictive change on pfts s/p lingulectomy plus effects of obesity (see separate a/p)   Each maintenance medication was reviewed in detail including most importantly the difference between maintenance and prns and under what circumstances the prns are to be triggered using an action plan format that is not reflected in the computer generated alphabetically organized AVS.    Please see instructions for details which were reviewed in writing and the patient given a copy highlighting the part that I personally wrote and discussed at today's ov.

## 2015-02-18 ENCOUNTER — Encounter: Payer: Self-pay | Admitting: Medical

## 2015-02-18 ENCOUNTER — Ambulatory Visit (INDEPENDENT_AMBULATORY_CARE_PROVIDER_SITE_OTHER): Payer: Medicare Other | Admitting: Medical

## 2015-02-18 VITALS — BP 106/68 | HR 81 | Temp 98.1°F | Ht 64.0 in | Wt 224.2 lb

## 2015-02-18 DIAGNOSIS — H6121 Impacted cerumen, right ear: Secondary | ICD-10-CM | POA: Diagnosis not present

## 2015-02-18 DIAGNOSIS — R7989 Other specified abnormal findings of blood chemistry: Secondary | ICD-10-CM | POA: Diagnosis not present

## 2015-02-18 DIAGNOSIS — M109 Gout, unspecified: Secondary | ICD-10-CM

## 2015-02-18 DIAGNOSIS — M10072 Idiopathic gout, left ankle and foot: Secondary | ICD-10-CM | POA: Diagnosis not present

## 2015-02-18 DIAGNOSIS — E559 Vitamin D deficiency, unspecified: Secondary | ICD-10-CM | POA: Diagnosis not present

## 2015-02-18 LAB — VITAMIN D 25 HYDROXY (VIT D DEFICIENCY, FRACTURES): VITD: 36.14 ng/mL (ref 30.00–100.00)

## 2015-02-18 LAB — URIC ACID: Uric Acid, Serum: 8.6 mg/dL — ABNORMAL HIGH (ref 2.4–7.0)

## 2015-02-18 MED ORDER — COLCHICINE 0.6 MG PO TABS: 0.6000 mg | ORAL_TABLET | Freq: Every day | ORAL | Status: DC | PRN

## 2015-02-18 NOTE — Patient Instructions (Addendum)
Ear lavage done today. (wax cleared post lavage)  Refill colchicine today. You have had progressive improvement. If perisists or worsens let us know. Will get uric acid level today.  Will get vitamin d level today. To see if recent treatment has brought levels up.  Follow up as regularly scheduled with pcp or as needed

## 2015-02-18 NOTE — Progress Notes (Signed)
Pre visit review using our clinic review tool, if applicable. No additional management support is needed unless otherwise documented below in the visit note. 

## 2015-02-18 NOTE — Progress Notes (Signed)
Subjective:    Patient ID: Amanda Crawford, female    DOB: 18-Mar-1945, 70 y.o.   MRN: [id number]  HPI  Pt in for feeling of ears feeling blocked. Pt states she had her ears checked by audiologist.(friend of family). Rt side was blocked by wax. No recent fever, chills, uri, or allergy signs or symptoms.   Pt just recently had some left great toe pain at base she. She states feels like gout flare. Pt has taken 3 days of colchicine. Getting better. She has one colchicine tablet left she needs refill.  Review of Systems  Constitutional: Negative for fever, chills and fatigue.  HENT: Negative for congestion, ear pain, postnasal drip, rhinorrhea and sinus pressure.        Rt ear blocked sensation.  Respiratory: Negative for cough, choking, chest tightness and wheezing.   Musculoskeletal:       Rt foot- base of toe and arch pain.    Past Medical History  Diagnosis Date  . Shortness of breath     occ  . Pneumonia     child  . Headache(784.0)     migraines  . Arthritis   . Lung cancer ([place])   . GERD (gastroesophageal reflux disease)     occ  . Renal cell cancer ([place])     renal cell ca dx 9/01 and 8/08;  . Migraine 11/23/2014  . History of chicken pox 11/23/2014  . H/O measles   . H/O mumps   . Obesity 11/23/2014    Social History   Social History  . Marital Status: Married    Spouse Name: N/A  . Number of Children: N/A  . Years of Education: N/A   Occupational History  . Not on file.   Social History Main Topics  . Smoking status: Former Smoker -- 0.50 packs/day for 35 years    Types: Cigarettes    Quit date: 05/08/2006  . Smokeless tobacco: Not on file  . Alcohol Use: No  . Drug Use: No  . Sexual Activity: Not on file     Comment: lives with husband, no dietary restrictions.    Other Topics Concern  . Not on file   Social History Narrative    Past Surgical History  Procedure Laterality Date  . Abdominal hysterectomy    . Lung cancer surgery Left 08  .  Renal mass excision Left 01  . Cholecystectomy    . Appendectomy    . Cardiac catheterization      yrs ago neg  . Video bronchoscopy N/A 05/09/2012    Procedure: VIDEO BRONCHOSCOPY;  Surgeon: Gaye Pollack, MD;  Location: [facility];  Service: Thoracic;  Laterality: N/A;  . Thoracotomy Left 05/09/2012    Procedure: THORACOTOMY MAJOR;  Surgeon: Gaye Pollack, MD;  Location: [facility];  Service: Thoracic;  Laterality: Left;  . Wedge resection Left 05/09/2012    Procedure: LEFT UPPER LOBE WEDGE RESECTION;  Surgeon: Gaye Pollack, MD;  Location: [facility] OR;  Service: Thoracic;  Laterality: Left;    Family History  Problem Relation Age of Onset  . Heart failure Father   . Asthma Brother   . Asthma Daughter     No Known Allergies  Current Outpatient Prescriptions on File Prior to Visit  Medication Sig Dispense Refill  . Biotin 1 MG CAPS Take by mouth.    . Calcium Carbonate (CALTRATE 600) 1500 MG TABS Take by mouth.    . cholecalciferol (VITAMIN D) 1000 UNITS tablet Take  2,000 Units by mouth daily.    . diphenhydramine-acetaminophen (TYLENOL PM) 25-500 MG TABS Take 1 tablet by mouth at bedtime as needed. For insomnia    . famotidine (PEPCID) 20 MG tablet Take 20 mg by mouth at bedtime.    . fish oil-omega-3 fatty acids 1000 MG capsule Take 2 g by mouth daily.    Marland Kitchen glucosamine-chondroitin 500-400 MG tablet Take 1 tablet by mouth daily.    . meclizine (ANTIVERT) 12.5 MG tablet Take 1 tablet (12.5 mg total) by mouth 3 (three) times daily as needed for dizziness. As needed for   Dizziness or nausea 30 tablet 1  . omeprazole (PRILOSEC OTC) 20 MG tablet Take 20 mg by mouth daily before breakfast.    . phenazopyridine (PYRIDIUM) 100 MG tablet Take 100 mg by mouth 3 (three) times daily as needed for pain.    . Vitamin D, Ergocalciferol, (DRISDOL) 50000 UNITS CAPS capsule Take 1 capsule (50,000 Units total) by mouth every 7 (seven) days. Take for 12 weeks 4 capsule 4   No current facility-administered  medications on file prior to visit.    BP 106/68 mmHg  Pulse 81  Temp(Src) 98.1 F (36.7 C) (Oral)  Ht '5\' 4"'$  (1.626 m)  Wt 224 lb 3.2 oz (101.696 kg)  BMI 38.46 kg/m2  SpO2 97%       Objective:   Physical Exam  General- No acute distress. Pleasant patient. Neck- Full range of motion, no jvd Lungs- Clear, even and unlabored. Heart- regular rate and rhythm. Neurologic- CNII- XII grossly intact.  Ears- lt side canal clear. Rt side blocked by wax.  Lt foot- faint pink appearance medial arch. Not warm to touch. Faint tender medial arch to base of toe.       Assessment & Plan:  Ear lavage done today. (wax cleared post lavage)  Refill colchicine today. You have had progressive improvement. If perisists or worsens let us know. Will get uric acid level today.  Will get vitamin d level today. To see if recent treatment has brought levels up.  Follow up as regularly scheduled with pcp or as needed

## 2015-02-26 ENCOUNTER — Ambulatory Visit: Payer: Medicare Other | Admitting: Family Medicine

## 2015-03-12 ENCOUNTER — Encounter: Payer: Self-pay | Admitting: Family Medicine

## 2015-03-12 ENCOUNTER — Ambulatory Visit (INDEPENDENT_AMBULATORY_CARE_PROVIDER_SITE_OTHER): Payer: Medicare Other | Admitting: Family Medicine

## 2015-03-12 VITALS — BP 128/82 | HR 86 | Temp 98.2°F | Ht 64.0 in | Wt 225.0 lb

## 2015-03-12 DIAGNOSIS — E559 Vitamin D deficiency, unspecified: Secondary | ICD-10-CM

## 2015-03-12 DIAGNOSIS — K219 Gastro-esophageal reflux disease without esophagitis: Secondary | ICD-10-CM

## 2015-03-12 DIAGNOSIS — L988 Other specified disorders of the skin and subcutaneous tissue: Secondary | ICD-10-CM

## 2015-03-12 DIAGNOSIS — M109 Gout, unspecified: Secondary | ICD-10-CM

## 2015-03-12 DIAGNOSIS — D72829 Elevated white blood cell count, unspecified: Secondary | ICD-10-CM

## 2015-03-12 DIAGNOSIS — E785 Hyperlipidemia, unspecified: Secondary | ICD-10-CM

## 2015-03-12 DIAGNOSIS — Z1159 Encounter for screening for other viral diseases: Secondary | ICD-10-CM

## 2015-03-12 DIAGNOSIS — Z85118 Personal history of other malignant neoplasm of bronchus and lung: Secondary | ICD-10-CM | POA: Diagnosis not present

## 2015-03-12 DIAGNOSIS — R1032 Left lower quadrant pain: Secondary | ICD-10-CM

## 2015-03-12 DIAGNOSIS — Z23 Encounter for immunization: Secondary | ICD-10-CM

## 2015-03-12 DIAGNOSIS — L309 Dermatitis, unspecified: Secondary | ICD-10-CM

## 2015-03-12 DIAGNOSIS — N649 Disorder of breast, unspecified: Secondary | ICD-10-CM

## 2015-03-12 DIAGNOSIS — T7840XA Allergy, unspecified, initial encounter: Secondary | ICD-10-CM

## 2015-03-12 DIAGNOSIS — R197 Diarrhea, unspecified: Secondary | ICD-10-CM

## 2015-03-12 HISTORY — DX: Disorder of breast, unspecified: N64.9

## 2015-03-12 HISTORY — DX: Allergy, unspecified, initial encounter: T78.40XA

## 2015-03-12 HISTORY — DX: Dermatitis, unspecified: L30.9

## 2015-03-12 HISTORY — DX: Other specified disorders of the skin and subcutaneous tissue: L98.8

## 2015-03-12 HISTORY — DX: Gout, unspecified: M10.9

## 2015-03-12 LAB — COMPREHENSIVE METABOLIC PANEL
ALK PHOS: 54 U/L (ref 39–117)
ALT: 45 U/L — AB (ref 0–35)
AST: 35 U/L (ref 0–37)
Albumin: 4.1 g/dL (ref 3.5–5.2)
BILIRUBIN TOTAL: 0.4 mg/dL (ref 0.2–1.2)
BUN: 17 mg/dL (ref 6–23)
CALCIUM: 10 mg/dL (ref 8.4–10.5)
CO2: 28 mEq/L (ref 19–32)
Chloride: 107 mEq/L (ref 96–112)
Creatinine, Ser: 1.06 mg/dL (ref 0.40–1.20)
GFR: 54.18 mL/min — AB (ref >60.00)
Glucose, Bld: 112 mg/dL — ABNORMAL HIGH (ref 70–99)
Potassium: 4.3 mEq/L (ref 3.5–5.1)
Sodium: 142 mEq/L (ref 135–145)
TOTAL PROTEIN: 7.6 g/dL (ref 6.0–8.3)

## 2015-03-12 LAB — LIPID PANEL
CHOLESTEROL: 252 mg/dL — AB (ref 0–200)
HDL: 51.3 mg/dL (ref >39.00)
NonHDL: 201.11
TRIGLYCERIDES: 314 mg/dL — AB (ref 0.0–149.0)
Total CHOL/HDL Ratio: 5
VLDL: 62.8 mg/dL — ABNORMAL HIGH (ref 0.0–40.0)

## 2015-03-12 LAB — TSH: TSH: 2.04 u[IU]/mL (ref 0.35–4.50)

## 2015-03-12 LAB — LDL CHOLESTEROL, DIRECT: Direct LDL: 146 mg/dL

## 2015-03-12 MED ORDER — ZOSTER VACCINE LIVE 19400 UNT/0.65ML ~~LOC~~ SOLR: 0.6500 mL | Freq: Once | SUBCUTANEOUS | Status: DC

## 2015-03-12 MED ORDER — TRIAMCINOLONE 0.1 % CREAM:EUCERIN CREAM 1:1: 1.0000 "application " | TOPICAL_CREAM | Freq: Every evening | CUTANEOUS | Status: DC | PRN

## 2015-03-12 MED ORDER — VITAMIN D (ERGOCALCIFEROL) 1.25 MG (50000 UNIT) PO CAPS: 50000.0000 [IU] | ORAL_CAPSULE | ORAL | Status: DC

## 2015-03-12 NOTE — Assessment & Plan Note (Signed)
Stay well hydrated. Use colchicine prn, repeat uric acid daily

## 2015-03-12 NOTE — Assessment & Plan Note (Signed)
Sneezes several x a day, encouraged to start Zyrtec 10 mg daily and see if that helps

## 2015-03-12 NOTE — Assessment & Plan Note (Signed)
Sees [facility] in [place], she believes they said to return in 3 years. She thinks it is due soon will rewquest recordss

## 2015-03-12 NOTE — Assessment & Plan Note (Signed)
Encouraged heart healthy diet, increase exercise, avoid trans fats, consider a krill oil cap daily 

## 2015-03-12 NOTE — Progress Notes (Signed)
Pre visit review using our clinic review tool, if applicable. No additional management support is needed unless otherwise documented below in the visit note. 

## 2015-03-12 NOTE — Assessment & Plan Note (Signed)
Continue 50000 weekly and 2000 IU daily and we will recheck in 3 mnths

## 2015-03-12 NOTE — Assessment & Plan Note (Signed)
Avoid offending foods, start probiotics. Do not eat large meals in late evening and consider raising head of bed. Only using Omeprazole prn but no flaring so far, continue Pepcid

## 2015-03-12 NOTE — Assessment & Plan Note (Signed)
Worst under right breast. Referred to dermatology for further evaluation

## 2015-03-12 NOTE — Patient Instructions (Addendum)
Food Choices for Gastroesophageal Reflux Disease, Adult  When you have gastroesophageal reflux disease (GERD), the foods you eat and your eating habits are very important. Choosing the right foods can help ease your discomfort.   WHAT GUIDELINES DO I NEED TO FOLLOW?   · Choose fruits, vegetables, whole grains, and low-fat dairy products.    · Choose low-fat meat, fish, and poultry.  · Limit fats such as oils, salad dressings, butter, nuts, and avocado.    · Keep a food diary. This helps you identify foods that cause symptoms.    · Avoid foods that cause symptoms. These may be different for everyone.    · Eat small meals often instead of 3 large meals a day.    · Eat your meals slowly, in a place where you are relaxed.    · Limit fried foods.    · Cook foods using methods other than frying.    · Avoid drinking alcohol.    · Avoid drinking large amounts of liquids with your meals.    · Avoid bending over or lying down until 2-3 hours after eating.    WHAT FOODS ARE NOT RECOMMENDED?   These are some foods and drinks that may make your symptoms worse:  Vegetables  Tomatoes. Tomato juice. Tomato and spaghetti sauce. Chili peppers. Onion and garlic. Horseradish.  Fruits  Oranges, grapefruit, and lemon (fruit and juice).  Meats  High-fat meats, fish, and poultry. This includes hot dogs, ribs, ham, sausage, salami, and bacon.  Dairy  Whole milk and chocolate milk. Sour cream. Cream. Butter. Ice cream. Cream cheese.   Drinks  Coffee and tea. Bubbly (carbonated) drinks or energy drinks.  Condiments  Hot sauce. Barbecue sauce.   Sweets/Desserts  Chocolate and cocoa. Donuts. Peppermint and spearmint.  Fats and Oils  High-fat foods. This includes French fries and potato chips.  Other  Vinegar. Strong spices. This includes black pepper, white pepper, red pepper, cayenne, curry powder, cloves, ginger, and chili powder.  The items listed above may not be a complete list of foods and drinks to avoid. Contact your dietitian for more  information.     This information is not intended to replace advice given to you by your health care provider. Make sure you discuss any questions you have with your health care provider.     Document Released: 07/18/2011 Document Revised: 02/06/2014 Document Reviewed: 11/20/2012  Elsevier Interactive Patient Education ©2016 [employer]

## 2015-03-12 NOTE — Assessment & Plan Note (Signed)
Behind left ear will try Triamcinolone cream 0.1% qhs in small quantities

## 2015-03-12 NOTE — Progress Notes (Signed)
Subjective:    Patient ID: Amanda Crawford, female    DOB: 14-Dec-1945, 70 y.o.   MRN: [id number]  Chief Complaint  Patient presents with  . Follow-up    HPI Patient is in today for follow up. Feeling well today, no recent hospitalizations or recent febrile illness. Notes a small scaly patch behind left ear. No pain or itching. Has noted some left foot pain. Denies CP/palp/SOB/HA/congestion/fevers/GI or GU c/o. Taking meds as prescribed  Past Medical History  Diagnosis Date  . Shortness of breath     occ  . Pneumonia     child  . Headache(784.0)     migraines  . Arthritis   . Lung cancer ([place])   . GERD (gastroesophageal reflux disease)     occ  . Renal cell cancer ([place])     renal cell ca dx 9/01 and 8/08;  . Migraine 11/23/2014  . History of chicken pox 11/23/2014  . H/O measles   . H/O mumps   . Obesity 11/23/2014  . Allergic state 03/12/2015  . Dermatitis 03/12/2015  . Skin lesion of breast 03/12/2015  . Colon polyp 11/19/2014  . Gout 03/12/2015    Past Surgical History  Procedure Laterality Date  . Abdominal hysterectomy    . Lung cancer surgery Left 08  . Renal mass excision Left 01  . Cholecystectomy    . Appendectomy    . Cardiac catheterization      yrs ago neg  . Video bronchoscopy N/A 05/09/2012    Procedure: VIDEO BRONCHOSCOPY;  Surgeon: Gaye Pollack, MD;  Location: [facility];  Service: Thoracic;  Laterality: N/A;  . Thoracotomy Left 05/09/2012    Procedure: THORACOTOMY MAJOR;  Surgeon: Gaye Pollack, MD;  Location: [facility] OR;  Service: Thoracic;  Laterality: Left;  . Wedge resection Left 05/09/2012    Procedure: LEFT UPPER LOBE WEDGE RESECTION;  Surgeon: Gaye Pollack, MD;  Location: [facility] OR;  Service: Thoracic;  Laterality: Left;    Family History  Problem Relation Age of Onset  . Heart failure Father   . Asthma Brother   . Asthma Daughter     Social History   Social History  . Marital Status: Married    Spouse Name: N/A  . Number of Children: N/A  .  Years of Education: N/A   Occupational History  . Not on file.   Social History Main Topics  . Smoking status: Former Smoker -- 0.50 packs/day for 35 years    Types: Cigarettes    Quit date: 05/08/2006  . Smokeless tobacco: Not on file  . Alcohol Use: No  . Drug Use: No  . Sexual Activity: Not on file     Comment: lives with husband, no dietary restrictions.    Other Topics Concern  . Not on file   Social History Narrative    Outpatient Prescriptions Prior to Visit  Medication Sig Dispense Refill  . Biotin 1 MG CAPS Take by mouth.    . Calcium Carbonate (CALTRATE 600) 1500 MG TABS Take by mouth.    . cholecalciferol (VITAMIN D) 1000 UNITS tablet Take 2,000 Units by mouth daily.    . colchicine 0.6 MG tablet Take 1 tablet (0.6 mg total) by mouth daily as needed. For gout 30 tablet 1  . diphenhydramine-acetaminophen (TYLENOL PM) 25-500 MG TABS Take 1 tablet by mouth at bedtime as needed. For insomnia    . famotidine (PEPCID) 20 MG tablet Take 20 mg by mouth at bedtime.    Marland Kitchen  fish oil-omega-3 fatty acids 1000 MG capsule Take 2 g by mouth daily.    Marland Kitchen glucosamine-chondroitin 500-400 MG tablet Take 1 tablet by mouth daily.    . meclizine (ANTIVERT) 12.5 MG tablet Take 1 tablet (12.5 mg total) by mouth 3 (three) times daily as needed for dizziness. As needed for   Dizziness or nausea 30 tablet 1  . omeprazole (PRILOSEC OTC) 20 MG tablet Take 20 mg by mouth daily before breakfast.    . Vitamin D, Ergocalciferol, (DRISDOL) 50000 UNITS CAPS capsule Take 1 capsule (50,000 Units total) by mouth every 7 (seven) days. Take for 12 weeks 4 capsule 4  . phenazopyridine (PYRIDIUM) 100 MG tablet Take 100 mg by mouth 3 (three) times daily as needed for pain. Reported on 03/12/2015     No facility-administered medications prior to visit.    No Known Allergies  Review of Systems  Constitutional: Negative for fever and malaise/fatigue.  HENT: Negative for congestion.   Eyes: Negative for  discharge.  Respiratory: Negative for shortness of breath.   Cardiovascular: Negative for chest pain, palpitations and leg swelling.  Gastrointestinal: Negative for nausea and abdominal pain.  Genitourinary: Negative for dysuria.  Musculoskeletal: Positive for joint pain. Negative for falls.  Skin: Negative for rash.  Neurological: Negative for loss of consciousness and headaches.  Endo/Heme/Allergies: Negative for environmental allergies.  Psychiatric/Behavioral: Negative for depression. The patient is not nervous/anxious.        Objective:    Physical Exam  Constitutional: She is oriented to person, place, and time. She appears well-developed and well-nourished. No distress.  HENT:  Head: Normocephalic and atraumatic.  Nose: Nose normal.  Eyes: Right eye exhibits no discharge. Left eye exhibits no discharge.  Neck: Normal range of motion. Neck supple.  Cardiovascular: Normal rate and regular rhythm.   No murmur heard. Pulmonary/Chest: Effort normal and breath sounds normal.  Abdominal: Soft. Bowel sounds are normal. There is no tenderness.  Musculoskeletal: She exhibits no edema.  Neurological: She is alert and oriented to person, place, and time.  Skin: Skin is warm and dry.  Small, flesh colored patch behind left ear.  Psychiatric: She has a normal mood and affect.  Nursing note and vitals reviewed.   BP 128/82 mmHg  Pulse 86  Temp(Src) 98.2 F (36.8 C) (Oral)  Ht 5' 4"  (1.626 m)  Wt 225 lb (102.059 kg)  BMI 38.60 kg/m2  SpO2 94% Wt Readings from Last 3 Encounters:  03/12/15 225 lb (102.059 kg)  02/18/15 224 lb 3.2 oz (101.696 kg)  01/20/15 223 lb (101.152 kg)     Lab Results  Component Value Date   WBC 8.9 03/12/2015   HGB 14.6 03/12/2015   HCT 47.2* 03/12/2015   PLT 318.0 03/12/2015   GLUCOSE 112* 03/12/2015   CHOL 252* 03/12/2015   TRIG 314.0* 03/12/2015   HDL 51.30 03/12/2015   LDLDIRECT 146.0 03/12/2015   LDLCALC * 01/31/2007    117        Total  Cholesterol/HDL:CHD Risk Coronary Heart Disease Risk Table                     Men   Women  1/2 Average Risk   3.4   3.3  Average Risk       5.0   4.4  2 X Average Risk   9.6   7.1  3 X Average Risk  23.4   11.0        Use the calculated Patient  Ratio above and the CHD Risk Table to determine the patient's CHD Risk.        ATP III CLASSIFICATION (LDL):  <100     mg/dL   Optimal  100-129  mg/dL   Near or Above                    Optimal  130-159  mg/dL   Borderline  160-189  mg/dL   High  >190     mg/dL   Very High   ALT 45* 03/12/2015   AST 35 03/12/2015   NA 142 03/12/2015   K 4.3 03/12/2015   CL 107 03/12/2015   CREATININE 1.06 03/12/2015   BUN 17 03/12/2015   CO2 28 03/12/2015   TSH 2.04 03/12/2015   INR 0.99 05/07/2012    Lab Results  Component Value Date   TSH 2.04 03/12/2015   Lab Results  Component Value Date   WBC 8.9 03/12/2015   HGB 14.6 03/12/2015   HCT 47.2* 03/12/2015   MCV 98.1 03/12/2015   PLT 318.0 03/12/2015   Lab Results  Component Value Date   NA 142 03/12/2015   K 4.3 03/12/2015   CHLORIDE 107 10/21/2014   CO2 28 03/12/2015   GLUCOSE 112* 03/12/2015   BUN 17 03/12/2015   CREATININE 1.06 03/12/2015   BILITOT 0.4 03/12/2015   ALKPHOS 54 03/12/2015   AST 35 03/12/2015   ALT 45* 03/12/2015   PROT 7.6 03/12/2015   ALBUMIN 4.1 03/12/2015   CALCIUM 10.0 03/12/2015   ANIONGAP 7 10/21/2014   EGFR 54* 10/21/2014   GFR 54.18* 03/12/2015   Lab Results  Component Value Date   CHOL 252* 03/12/2015   Lab Results  Component Value Date   HDL 51.30 03/12/2015   Lab Results  Component Value Date   LDLCALC * 01/31/2007    117        Total Cholesterol/HDL:CHD Risk Coronary Heart Disease Risk Table                     Men   Women  1/2 Average Risk   3.4   3.3  Average Risk       5.0   4.4  2 X Average Risk   9.6   7.1  3 X Average Risk  23.4   11.0        Use the calculated Patient Ratio above and the CHD Risk Table to determine the  patient's CHD Risk.        ATP III CLASSIFICATION (LDL):  <100     mg/dL   Optimal  100-129  mg/dL   Near or Above                    Optimal  130-159  mg/dL   Borderline  160-189  mg/dL   High  >190     mg/dL   Very High   Lab Results  Component Value Date   TRIG 314.0* 03/12/2015   Lab Results  Component Value Date   CHOLHDL 5 03/12/2015   No results found for: HGBA1C     Assessment & Plan:   Problem List Items Addressed This Visit    Allergic state    Sneezes several x a day, encouraged to start Zyrtec 10 mg daily and see if that helps      Colon polyp    [facility] in [place], she believes they said to return in 3  years. She thinks it is due soon will rewquest recordss      Dermatitis    Behind left ear will try Triamcinolone cream 0.1% qhs in small quantities      GERD (gastroesophageal reflux disease)    Avoid offending foods, start probiotics. Do not eat large meals in late evening and consider raising head of bed. Only using Omeprazole prn but no flaring so far, continue Pepcid      Relevant Orders   Comprehensive metabolic panel (Completed)   CBC with Differential/Platelet (Completed)   TSH (Completed)   CBC with Differential/Platelet   TSH   Lipid panel   Comprehensive metabolic panel   Vitamin D (25 hydroxy)   Uric acid   Gout    Stay well hydrated. Use colchicine prn, repeat uric acid daily      Hx of cancer of lung   Relevant Orders   Comprehensive metabolic panel (Completed)   CBC with Differential/Platelet (Completed)   TSH (Completed)   CBC with Differential/Platelet   TSH   Lipid panel   Comprehensive metabolic panel   Vitamin D (25 hydroxy)   Uric acid   Hyperlipidemia    Encouraged heart healthy diet, increase exercise, avoid trans fats, consider a krill oil cap daily      Relevant Orders   Lipid panel (Completed)   CBC with Differential/Platelet   TSH   Lipid panel   Comprehensive metabolic panel   Vitamin  D (25 hydroxy)   Uric acid   Skin lesion of breast    Worst under right breast. Referred to dermatology for further evaluation       Other Visit Diagnoses    Need for vaccination with 13-polyvalent pneumococcal conjugate vaccine    -  Primary    Relevant Orders    Pneumococcal conjugate vaccine 13-valent IM (Completed)    Elevated WBCs        Relevant Orders    CBC with Differential/Platelet    TSH    Lipid panel    Comprehensive metabolic panel    Vitamin D (25 hydroxy)    Uric acid    Diarrhea, unspecified type        Relevant Orders    CBC with Differential/Platelet    TSH    Lipid panel    Comprehensive metabolic panel    Vitamin D (25 hydroxy)    Uric acid    Need for hepatitis C screening test        Relevant Orders    Hepatitis C antibody (Completed)    Need for shingles vaccine        Relevant Medications    zoster vaccine live, PF, (ZOSTAVAX) 79024 UNT/0.65ML injection    Vitamin D deficiency        Relevant Medications    Vitamin D, Ergocalciferol, (DRISDOL) 50000 units CAPS capsule       I am having Ms. Leeman start on triamcinolone 0.1 % cream : eucerin and zoster vaccine live (PF). I am also having her maintain her fish oil-omega-3 fatty acids, diphenhydramine-acetaminophen, glucosamine-chondroitin, Biotin, calcium carbonate, phenazopyridine, meclizine, cholecalciferol, omeprazole, famotidine, colchicine, and Vitamin D (Ergocalciferol).  Meds ordered this encounter  Medications  . Triamcinolone Acetonide (TRIAMCINOLONE 0.1 % CREAM : EUCERIN) CREA    Sig: Apply 1 application topically at bedtime as needed.    Dispense:  1 each    Refill:  1  . zoster vaccine live, PF, (ZOSTAVAX) 09735 UNT/0.65ML injection    Sig: Inject 19,400 Units into  the skin once.    Dispense:  1 each    Refill:  0  . Vitamin D, Ergocalciferol, (DRISDOL) 50000 units CAPS capsule    Sig: Take 1 capsule (50,000 Units total) by mouth every 7 (seven) days. Take for 12 weeks     Dispense:  4 capsule    Refill:  4     Penni Homans, MD

## 2015-03-12 NOTE — Assessment & Plan Note (Signed)
Check vitamin D level today 

## 2015-03-12 NOTE — Assessment & Plan Note (Signed)
Improved with acid suppression

## 2015-03-13 LAB — CBC WITH DIFFERENTIAL/PLATELET
BASOS PCT: 0.4 % (ref 0.0–3.0)
Basophils Absolute: 0 10*3/uL (ref 0.0–0.1)
EOS PCT: 2.2 % (ref 0.0–5.0)
Eosinophils Absolute: 0.2 10*3/uL (ref 0.0–0.7)
HCT: 47.2 % — ABNORMAL HIGH (ref 36.0–46.0)
HEMOGLOBIN: 14.6 g/dL (ref 12.0–15.0)
LYMPHS ABS: 3 10*3/uL (ref 0.7–4.0)
Lymphocytes Relative: 34 % (ref 12.0–46.0)
MCHC: 30.9 g/dL (ref 30.0–36.0)
MCV: 98.1 fl (ref 78.0–100.0)
MONO ABS: 0.6 10*3/uL (ref 0.1–1.0)
Monocytes Relative: 6.6 % (ref 3.0–12.0)
NEUTROS ABS: 5.1 10*3/uL (ref 1.4–7.7)
NEUTROS PCT: 56.8 % (ref 43.0–77.0)
Platelets: 318 10*3/uL (ref 150.0–400.0)
RBC: 4.81 Mil/uL (ref 3.87–5.11)
RDW: 13.4 % (ref 11.5–15.5)
WBC: 8.9 10*3/uL (ref 4.0–10.5)

## 2015-03-13 LAB — HEPATITIS C ANTIBODY: HCV Ab: NEGATIVE

## 2015-03-15 ENCOUNTER — Other Ambulatory Visit: Payer: Self-pay | Admitting: Family Medicine

## 2015-03-15 MED ORDER — ATORVASTATIN CALCIUM 10 MG PO TABS: 10.0000 mg | ORAL_TABLET | ORAL | Status: DC

## 2015-04-21 ENCOUNTER — Other Ambulatory Visit: Payer: Self-pay | Admitting: *Deleted

## 2015-04-21 ENCOUNTER — Encounter (HOSPITAL_COMMUNITY): Payer: Self-pay

## 2015-04-21 ENCOUNTER — Other Ambulatory Visit (HOSPITAL_BASED_OUTPATIENT_CLINIC_OR_DEPARTMENT_OTHER): Payer: Medicare Other

## 2015-04-21 ENCOUNTER — Other Ambulatory Visit: Payer: Self-pay | Admitting: Oncology

## 2015-04-21 ENCOUNTER — Ambulatory Visit (HOSPITAL_COMMUNITY)
Admission: RE | Admit: 2015-04-21 | Discharge: 2015-04-21 | Disposition: A | Discharge status: Home / Self Care | Payer: Medicare Other | Source: Ambulatory Visit | Attending: Oncology | Admitting: Oncology

## 2015-04-21 ENCOUNTER — Encounter: Payer: Self-pay | Admitting: *Deleted

## 2015-04-21 DIAGNOSIS — Z905 Acquired absence of kidney: Secondary | ICD-10-CM | POA: Diagnosis not present

## 2015-04-21 DIAGNOSIS — C649 Malignant neoplasm of unspecified kidney, except renal pelvis: Secondary | ICD-10-CM

## 2015-04-21 DIAGNOSIS — C642 Malignant neoplasm of left kidney, except renal pelvis: Secondary | ICD-10-CM | POA: Insufficient documentation

## 2015-04-21 DIAGNOSIS — M47816 Spondylosis without myelopathy or radiculopathy, lumbar region: Secondary | ICD-10-CM | POA: Insufficient documentation

## 2015-04-21 DIAGNOSIS — M5136 Other intervertebral disc degeneration, lumbar region: Secondary | ICD-10-CM | POA: Diagnosis not present

## 2015-04-21 DIAGNOSIS — I700 Atherosclerosis of aorta: Secondary | ICD-10-CM | POA: Insufficient documentation

## 2015-04-21 DIAGNOSIS — Z85528 Personal history of other malignant neoplasm of kidney: Secondary | ICD-10-CM

## 2015-04-21 DIAGNOSIS — R590 Localized enlarged lymph nodes: Secondary | ICD-10-CM | POA: Diagnosis not present

## 2015-04-21 DIAGNOSIS — K769 Liver disease, unspecified: Secondary | ICD-10-CM | POA: Diagnosis not present

## 2015-04-21 LAB — COMPREHENSIVE METABOLIC PANEL
ALT: 46 U/L (ref 0–55)
ANION GAP: 10 meq/L (ref 3–11)
AST: 38 U/L — AB (ref 5–34)
Albumin: 4 g/dL (ref 3.5–5.0)
Alkaline Phosphatase: 58 U/L (ref 40–150)
BUN: 18.6 mg/dL (ref 7.0–26.0)
CALCIUM: 10.1 mg/dL (ref 8.4–10.4)
CHLORIDE: 106 meq/L (ref 98–109)
CO2: 25 meq/L (ref 22–29)
CREATININE: 1.2 mg/dL — AB (ref 0.6–1.1)
EGFR: 46 mL/min/{1.73_m2} — ABNORMAL LOW (ref >90)
Glucose: 120 mg/dl (ref 70–140)
POTASSIUM: 4.4 meq/L (ref 3.5–5.1)
Sodium: 141 mEq/L (ref 136–145)
Total Bilirubin: 0.55 mg/dL (ref 0.20–1.20)
Total Protein: 8 g/dL (ref 6.4–8.3)

## 2015-04-21 LAB — CBC WITH DIFFERENTIAL/PLATELET
BASO%: 0.3 % (ref 0.0–2.0)
BASOS ABS: 0 10*3/uL (ref 0.0–0.1)
EOS%: 2.6 % (ref 0.0–7.0)
Eosinophils Absolute: 0.3 10*3/uL (ref 0.0–0.5)
HCT: 44.3 % (ref 34.8–46.6)
HGB: 14.4 g/dL (ref 11.6–15.9)
LYMPH%: 29.7 % (ref 14.0–49.7)
MCH: 30.6 pg (ref 25.1–34.0)
MCHC: 32.5 g/dL (ref 31.5–36.0)
MCV: 94.1 fL (ref 79.5–101.0)
MONO#: 0.7 10*3/uL (ref 0.1–0.9)
MONO%: 6.9 % (ref 0.0–14.0)
NEUT#: 6 10*3/uL (ref 1.5–6.5)
NEUT%: 60.5 % (ref 38.4–76.8)
PLATELETS: 292 10*3/uL (ref 145–400)
RBC: 4.71 10*6/uL (ref 3.70–5.45)
RDW: 13.3 % (ref 11.2–14.5)
WBC: 9.9 10*3/uL (ref 3.9–10.3)
lymph#: 3 10*3/uL (ref 0.9–3.3)

## 2015-04-21 MED ORDER — IOPAMIDOL (ISOVUE-300) INJECTION 61%
100.0000 mL | Freq: Once | INTRAVENOUS | Status: AC | PRN
  Administered 2015-04-21: 100 mL via INTRAVENOUS

## 2015-04-23 ENCOUNTER — Telehealth: Payer: Self-pay | Admitting: Oncology

## 2015-04-23 ENCOUNTER — Ambulatory Visit (HOSPITAL_BASED_OUTPATIENT_CLINIC_OR_DEPARTMENT_OTHER): Payer: Medicare Other | Admitting: Oncology

## 2015-04-23 VITALS — BP 152/74 | HR 91 | Temp 98.3°F | Resp 19 | Ht 64.0 in | Wt 227.1 lb

## 2015-04-23 DIAGNOSIS — C649 Malignant neoplasm of unspecified kidney, except renal pelvis: Secondary | ICD-10-CM | POA: Diagnosis not present

## 2015-04-23 NOTE — Progress Notes (Signed)
Hematology and Oncology Follow Up Visit  Amanda Crawford [id number] 08/31/1945 70 y.o. 04/23/2015 2:01 PM  CC: Nicanor Alcon, M.D.  Priscille Heidelberg Little, M.D.    Principle Diagnosis: This is a 70 year old female diagnosed with renal cell carcinoma, diagnosed in 2001, had a T2 disease and had recurrent disease in a lung nodule in 2008.  Prior Therapy:  She is status post left nephrectomy in 2001.  She developed a left lingular mass, status post VATS procedure.  Pathology revealed a recurrent renal cell carcinoma resected in October 2008.   She developed relapse disease in 2014 and subsequently underwent a VATS and left upper lobe wedge resection of a tumor done on 05/09/2012. Pathology confirmed another relapse.  Current therapy: Observation and surveillance.  Interim History: Amanda Crawford presents today for a followup visit by her self. Since the last visit, she reports feeling relatively fair. She has been dieting recently and have lost a few pounds and been trying to exercise. Losing weight have helped her some with decreased dyspnea on exertion and better exercise tolerance. She denied any respiratory symptoms including cough, wheezing or hemoptysis. She has been seen by pulmonary medicine and no lung pathology had been diagnosed.  She continues to be active and performs activities of daily living without any decline. She is retired from work but remains involved in her church activity among other things. She is able to perform these duties without any excessive fatigue or tiredness.  She does not report any headaches, blurry vision, syncope or seizures. She does not report any fevers, chills or sweats. She has not reported any GI symptoms including abdominal pain or diarrhea. She has not reported any fever or hematochezia. She does not report any frequency urgency or hesitancy. She does not report any skeletal complaints. Does not report any lymphadenopathy or petechiae. Rest of the review of  systems unremarkable.   Medications: I have reviewed the patient's current medications. Current Outpatient Prescriptions  Medication Sig Dispense Refill  . atorvastatin (LIPITOR) 10 MG tablet Take 1 tablet (10 mg total) by mouth every other day. 15 tablet 5  . Biotin 1 MG CAPS Take by mouth.    . Calcium Carbonate (CALTRATE 600) 1500 MG TABS Take by mouth.    . cholecalciferol (VITAMIN D) 1000 UNITS tablet Take 2,000 Units by mouth daily.    . colchicine 0.6 MG tablet Take 1 tablet (0.6 mg total) by mouth daily as needed. For gout 30 tablet 1  . diphenhydramine-acetaminophen (TYLENOL PM) 25-500 MG TABS Take 1 tablet by mouth at bedtime as needed. For insomnia    . famotidine (PEPCID) 20 MG tablet Take 20 mg by mouth at bedtime.    . fish oil-omega-3 fatty acids 1000 MG capsule Take 2 g by mouth daily.    Marland Kitchen glucosamine-chondroitin 500-400 MG tablet Take 1 tablet by mouth daily.    . meclizine (ANTIVERT) 12.5 MG tablet Take 1 tablet (12.5 mg total) by mouth 3 (three) times daily as needed for dizziness. As needed for   Dizziness or nausea 30 tablet 1  . omeprazole (PRILOSEC OTC) 20 MG tablet Take 20 mg by mouth daily before breakfast.    . Vitamin D, Ergocalciferol, (DRISDOL) 50000 units CAPS capsule Take 1 capsule (50,000 Units total) by mouth every 7 (seven) days. Take for 12 weeks 4 capsule 4   No current facility-administered medications for this visit.     Allergies: No Known Allergies  Past Medical History, Surgical history, Social history, and  Family History were reviewed and updated.   Physical Exam: Blood pressure 152/74, pulse 91, temperature 98.3 F (36.8 C), temperature source Oral, resp. rate 19, height '5\' 4"'$  (1.626 m), weight 227 lb 1.6 oz (103.012 kg), SpO2 95 %. ECOG: 1 General appearance: alert awake woman without distress. Head: Normocephalic, without obvious abnormality no oral thrush noted. Neck: no adenopathy Lymph nodes: Cervical, supraclavicular, and axillary  nodes normal. Heart:regular rate and rhythm, S1, S2 normal, no murmur, click, rub or gallop Lung:chest clear, no wheezing, rales, normal symmetric air entry. No dullness to percussion. Abdomin: soft, non-tender, without masses or organomegaly no rebound or guarding. EXT:no erythema, induration, or nodules Skin: No rashes or lesions.  Lab Results: Lab Results  Component Value Date   WBC 9.9 04/21/2015   HGB 14.4 04/21/2015   HCT 44.3 04/21/2015   MCV 94.1 04/21/2015   PLT 292 04/21/2015     Chemistry      Component Value Date/Time   NA 141 04/21/2015 0738   NA 142 03/12/2015 0940   NA 137 07/05/2010 1117   K 4.4 04/21/2015 0738   K 4.3 03/12/2015 0940   K 4.9* 07/05/2010 1117   CL 107 03/12/2015 0940   CL 107 04/16/2012 0923   CL 96* 07/05/2010 1117   CO2 25 04/21/2015 0738   CO2 28 03/12/2015 0940   CO2 29 07/05/2010 1117   BUN 18.6 04/21/2015 0738   BUN 17 03/12/2015 0940   BUN 25* 07/05/2010 1117   CREATININE 1.2* 04/21/2015 0738   CREATININE 1.06 03/12/2015 0940   CREATININE 1.4* 07/05/2010 1117      Component Value Date/Time   CALCIUM 10.1 04/21/2015 0738   CALCIUM 10.0 03/12/2015 0940   CALCIUM 9.9 07/05/2010 1117   ALKPHOS 58 04/21/2015 0738   ALKPHOS 54 03/12/2015 0940   ALKPHOS 65 07/05/2010 1117   AST 38* 04/21/2015 0738   AST 35 03/12/2015 0940   AST 34 07/05/2010 1117   ALT 46 04/21/2015 0738   ALT 45* 03/12/2015 0940   ALT 44 07/05/2010 1117   BILITOT 0.55 04/21/2015 0738   BILITOT 0.4 03/12/2015 0940   BILITOT 1.00 07/05/2010 1117      EXAM: CT CHEST, ABDOMEN, AND PELVIS WITH CONTRAST  TECHNIQUE: Multidetector CT imaging of the chest, abdomen and pelvis was performed following the standard protocol during bolus administration of intravenous contrast.  CONTRAST: 13m ISOVUE-300 IOPAMIDOL (ISOVUE-300) INJECTION 61%  COMPARISON: Multiple exams, including 10/21/2014  FINDINGS: CT CHEST FINDINGS  Mediastinum/Nodes: Coronary,  aortic arch, and branch vessel atherosclerotic vascular disease. 9 mm in short axis AP window lymph node, image 28/6, stable. No overtly pathologic thoracic adenopathy.  Lungs/Pleura: Wedge resections of the left lung. No new nodule.  Musculoskeletal: Mild thoracic spondylosis.  CT ABDOMEN PELVIS FINDINGS  Hepatobiliary: Cholecystectomy. Stable 6 mm hypodense lesion in the dome of the right hepatic lobe, no change from 2012, considered benign.  Pancreas: Unremarkable  Spleen: Unremarkable  Adrenals/Urinary Tract: Left nephrectomy. No local recurrence. Right kidney normal. No renal calculus.  Stomach/Bowel: Prior appendectomy. Scattered sigmoid colon diverticula, noninflamed.  Vascular/Lymphatic: Aortoiliac atherosclerotic vascular disease. No pathologic adenopathy.  Reproductive: Uterus absent. Ovaries not well seen.  Other: Along the left upper omental adipose tissue there is a 1.8 by 0.9 cm hazy nodular structure, not changed 10/09/2006 and not previously hypermetabolic, likely a small focus of remote prior omental infarct or prior omental inflammation.  Musculoskeletal: Lumbar spondylosis and degenerative disc disease likely causing foraminal impingement at L5-S1 and on the  right at L4-5.  IMPRESSION: 1. No findings of new metastatic disease. 2. Other imaging findings of potential clinical significance: Coronary, aortic arch, and branch vessel atherosclerotic vascular disease. Which resections in the left lung. Chronically stable 6 mm hypodense lesion in the dome of the right hepatic lobe, probably a cyst or hemangioma. Left nephrectomy. Aortoiliac atherosclerotic vascular disease. Left hazy omental nodule unchanged from 2008, considered benign. Lumbar spondylosis and degenerative disc disease likely causing foraminal impingement at L5-S1 and on the right at L4-5 .   Impression and Plan:   This is a pleasant 70 year-old female with the following  issues: 1. Renal cell carcinoma.  She has stage IV NED disease with lung nodules removed on 2 separate occasions the last of which removed in 04/2012. CT scan from 04/21/2015 was reviewed today did not show any evidence of relapsed disease. The plan is to continue with active surveillance and repeat imaging studies every 6 months. Systemic therapy will be used if she develops widespread disease. Her disease biology have been very indolent with recurrences separated by few years at a time. Repeated local therapy might be an option in the future depending on the pattern of recurrence. 2.     Diverticulosis: She has no evidence of diverticulitis and she had a colonoscopy recently without any complications. 3.     Pain around the thoracotomy site: Seems to be manageable at this time. 4.     Dyspnea on exertion: Likely related to her weight and deconditioning. Seems to be improving at this time with her weight loss. 5.     Follow up: in 6 months for clinical visit and CT scan.    Zola Button, MD 3/24/20172:01 PM

## 2015-04-23 NOTE — Telephone Encounter (Signed)
per [facility] to sch pt appt-adv [facility] to sch pt scan

## 2015-04-26 ENCOUNTER — Ambulatory Visit: Payer: Medicare Other | Admitting: Family Medicine

## 2015-07-05 ENCOUNTER — Other Ambulatory Visit: Payer: Medicare Other

## 2015-07-09 ENCOUNTER — Other Ambulatory Visit: Payer: Self-pay | Admitting: Family Medicine

## 2015-07-09 MED ORDER — ATORVASTATIN CALCIUM 10 MG PO TABS: 10.0000 mg | ORAL_TABLET | ORAL | Status: DC

## 2015-08-12 ENCOUNTER — Other Ambulatory Visit: Payer: Self-pay | Admitting: Oncology

## 2015-08-12 DIAGNOSIS — C649 Malignant neoplasm of unspecified kidney, except renal pelvis: Secondary | ICD-10-CM

## 2015-08-25 ENCOUNTER — Other Ambulatory Visit (INDEPENDENT_AMBULATORY_CARE_PROVIDER_SITE_OTHER): Payer: Medicare Other

## 2015-08-25 DIAGNOSIS — E785 Hyperlipidemia, unspecified: Secondary | ICD-10-CM

## 2015-08-25 DIAGNOSIS — Z85118 Personal history of other malignant neoplasm of bronchus and lung: Secondary | ICD-10-CM | POA: Diagnosis not present

## 2015-08-25 DIAGNOSIS — R197 Diarrhea, unspecified: Secondary | ICD-10-CM

## 2015-08-25 DIAGNOSIS — R1032 Left lower quadrant pain: Secondary | ICD-10-CM

## 2015-08-25 DIAGNOSIS — K219 Gastro-esophageal reflux disease without esophagitis: Secondary | ICD-10-CM | POA: Diagnosis not present

## 2015-08-25 DIAGNOSIS — D72829 Elevated white blood cell count, unspecified: Secondary | ICD-10-CM

## 2015-08-25 LAB — COMPREHENSIVE METABOLIC PANEL
ALT: 46 U/L — AB (ref 0–35)
AST: 38 U/L — AB (ref 0–37)
Albumin: 4.2 g/dL (ref 3.5–5.2)
Alkaline Phosphatase: 62 U/L (ref 39–117)
BILIRUBIN TOTAL: 0.7 mg/dL (ref 0.2–1.2)
BUN: 16 mg/dL (ref 6–23)
CHLORIDE: 103 meq/L (ref 96–112)
CO2: 28 meq/L (ref 19–32)
CREATININE: 1.07 mg/dL (ref 0.40–1.20)
Calcium: 10.2 mg/dL (ref 8.4–10.5)
GFR: 53.53 mL/min — ABNORMAL LOW (ref >60.00)
GLUCOSE: 120 mg/dL — AB (ref 70–99)
Potassium: 4.3 mEq/L (ref 3.5–5.1)
SODIUM: 139 meq/L (ref 135–145)
Total Protein: 7.5 g/dL (ref 6.0–8.3)

## 2015-08-25 LAB — LIPID PANEL
CHOL/HDL RATIO: 4
CHOLESTEROL: 190 mg/dL (ref 0–200)
HDL: 51.7 mg/dL (ref >39.00)
NonHDL: 138.22
Triglycerides: 311 mg/dL — ABNORMAL HIGH (ref 0.0–149.0)
VLDL: 62.2 mg/dL — AB (ref 0.0–40.0)

## 2015-08-25 LAB — CBC WITH DIFFERENTIAL/PLATELET
BASOS PCT: 0.3 % (ref 0.0–3.0)
Basophils Absolute: 0 10*3/uL (ref 0.0–0.1)
EOS ABS: 0.2 10*3/uL (ref 0.0–0.7)
EOS PCT: 2.3 % (ref 0.0–5.0)
HEMATOCRIT: 42 % (ref 36.0–46.0)
HEMOGLOBIN: 14.1 g/dL (ref 12.0–15.0)
Lymphocytes Relative: 31.2 % (ref 12.0–46.0)
Lymphs Abs: 2.8 10*3/uL (ref 0.7–4.0)
MCHC: 33.6 g/dL (ref 30.0–36.0)
MCV: 90.6 fl (ref 78.0–100.0)
MONO ABS: 0.5 10*3/uL (ref 0.1–1.0)
Monocytes Relative: 5.3 % (ref 3.0–12.0)
NEUTROS ABS: 5.4 10*3/uL (ref 1.4–7.7)
Neutrophils Relative %: 60.9 % (ref 43.0–77.0)
PLATELETS: 290 10*3/uL (ref 150.0–400.0)
RBC: 4.64 Mil/uL (ref 3.87–5.11)
RDW: 13.3 % (ref 11.5–15.5)
WBC: 8.9 10*3/uL (ref 4.0–10.5)

## 2015-08-25 LAB — LDL CHOLESTEROL, DIRECT: Direct LDL: 95 mg/dL

## 2015-08-25 LAB — URIC ACID: Uric Acid, Serum: 9.1 mg/dL — ABNORMAL HIGH (ref 2.4–7.0)

## 2015-08-25 LAB — VITAMIN D 25 HYDROXY (VIT D DEFICIENCY, FRACTURES): VITD: 45.68 ng/mL (ref 30.00–100.00)

## 2015-08-25 LAB — TSH: TSH: 2.34 u[IU]/mL (ref 0.35–4.50)

## 2015-08-31 ENCOUNTER — Encounter: Payer: Self-pay | Admitting: Family Medicine

## 2015-08-31 ENCOUNTER — Ambulatory Visit (INDEPENDENT_AMBULATORY_CARE_PROVIDER_SITE_OTHER): Payer: Medicare Other | Admitting: Family Medicine

## 2015-08-31 VITALS — BP 120/68 | HR 90 | Temp 98.4°F | Ht 64.0 in | Wt 225.2 lb

## 2015-08-31 DIAGNOSIS — M8580 Other specified disorders of bone density and structure, unspecified site: Secondary | ICD-10-CM

## 2015-08-31 DIAGNOSIS — Z23 Encounter for immunization: Secondary | ICD-10-CM

## 2015-08-31 DIAGNOSIS — E785 Hyperlipidemia, unspecified: Secondary | ICD-10-CM | POA: Diagnosis not present

## 2015-08-31 DIAGNOSIS — Z1211 Encounter for screening for malignant neoplasm of colon: Secondary | ICD-10-CM | POA: Diagnosis not present

## 2015-08-31 DIAGNOSIS — M546 Pain in thoracic spine: Secondary | ICD-10-CM | POA: Diagnosis not present

## 2015-08-31 DIAGNOSIS — Z0001 Encounter for general adult medical examination with abnormal findings: Secondary | ICD-10-CM | POA: Diagnosis not present

## 2015-08-31 DIAGNOSIS — R0789 Other chest pain: Secondary | ICD-10-CM | POA: Diagnosis not present

## 2015-08-31 DIAGNOSIS — Z0000 Encounter for general adult medical examination without abnormal findings: Secondary | ICD-10-CM

## 2015-08-31 DIAGNOSIS — K219 Gastro-esophageal reflux disease without esophagitis: Secondary | ICD-10-CM

## 2015-08-31 MED ORDER — ZOSTER VACCINE LIVE 19400 UNT/0.65ML ~~LOC~~ SUSR: 0.6500 mL | Freq: Once | SUBCUTANEOUS | 0 refills | Status: DC

## 2015-08-31 MED ORDER — ZOSTER VACCINE LIVE 19400 UNT/0.65ML ~~LOC~~ SUSR: 0.6500 mL | Freq: Once | SUBCUTANEOUS | 0 refills | Status: AC

## 2015-08-31 NOTE — Progress Notes (Signed)
Pre visit review using our clinic review tool, if applicable. No additional management support is needed unless otherwise documented below in the visit note. 

## 2015-08-31 NOTE — Patient Instructions (Signed)
Try Lidocaine patch to back with pain  Preventive Care for Adults, Female A healthy lifestyle and preventive care can promote health and wellness. Preventive health guidelines for women include the following key practices.  A routine yearly physical is a good way to check with your health care provider about your health and preventive screening. It is a chance to share any concerns and updates on your health and to receive a thorough exam.  Visit your dentist for a routine exam and preventive care every 6 months. Brush your teeth twice a day and floss once a day. Good oral hygiene prevents tooth decay and gum disease.  The frequency of eye exams is based on your age, health, family medical history, use of contact lenses, and other factors. Follow your health care provider's recommendations for frequency of eye exams.  Eat a healthy diet. Foods like vegetables, fruits, whole grains, low-fat dairy products, and lean protein foods contain the nutrients you need without too many calories. Decrease your intake of foods high in solid fats, added sugars, and salt. Eat the right amount of calories for you.Get information about a proper diet from your health care provider, if necessary.  Regular physical exercise is one of the most important things you can do for your health. Most adults should get at least 150 minutes of moderate-intensity exercise (any activity that increases your heart rate and causes you to sweat) each week. In addition, most adults need muscle-strengthening exercises on 2 or more days a week.  Maintain a healthy weight. The body mass index (BMI) is a screening tool to identify possible weight problems. It provides an estimate of body fat based on height and weight. Your health care provider can find your BMI and can help you achieve or maintain a healthy weight.For adults 20 years and older:  A BMI below 18.5 is considered underweight.  A BMI of 18.5 to 24.9 is normal.  A BMI of  25 to 29.9 is considered overweight.  A BMI of 30 and above is considered obese.  Maintain normal blood lipids and cholesterol levels by exercising and minimizing your intake of saturated fat. Eat a balanced diet with plenty of fruit and vegetables. Blood tests for lipids and cholesterol should begin at age 75 and be repeated every 5 years. If your lipid or cholesterol levels are high, you are over 50, or you are at high risk for heart disease, you may need your cholesterol levels checked more frequently.Ongoing high lipid and cholesterol levels should be treated with medicines if diet and exercise are not working.  If you smoke, find out from your health care provider how to quit. If you do not use tobacco, do not start.  Lung cancer screening is recommended for adults aged 28-80 years who are at high risk for developing lung cancer because of a history of smoking. A yearly low-dose CT scan of the lungs is recommended for people who have at least a 30-pack-year history of smoking and are a current smoker or have quit within the past 15 years. A pack year of smoking is smoking an average of 1 pack of cigarettes a day for 1 year (for example: 1 pack a day for 30 years or 2 packs a day for 15 years). Yearly screening should continue until the smoker has stopped smoking for at least 15 years. Yearly screening should be stopped for people who develop a health problem that would prevent them from having lung cancer treatment.  If you are  pregnant, do not drink alcohol. If you are breastfeeding, be very cautious about drinking alcohol. If you are not pregnant and choose to drink alcohol, do not have more than 1 drink per day. One drink is considered to be 12 ounces (355 mL) of beer, 5 ounces (148 mL) of wine, or 1.5 ounces (44 mL) of liquor.  Avoid use of street drugs. Do not share needles with anyone. Ask for help if you need support or instructions about stopping the use of drugs.  High blood pressure  causes heart disease and increases the risk of stroke. Your blood pressure should be checked at least every 1 to 2 years. Ongoing high blood pressure should be treated with medicines if weight loss and exercise do not work.  If you are 13-70 years old, ask your health care provider if you should take aspirin to prevent strokes.  Diabetes screening is done by taking a blood sample to check your blood glucose level after you have not eaten for a certain period of time (fasting). If you are not overweight and you do not have risk factors for diabetes, you should be screened once every 3 years starting at age 85. If you are overweight or obese and you are 36-66 years of age, you should be screened for diabetes every year as part of your cardiovascular risk assessment.  Breast cancer screening is essential preventive care for women. You should practice "breast self-awareness." This means understanding the normal appearance and feel of your breasts and may include breast self-examination. Any changes detected, no matter how small, should be reported to a health care provider. Women in their 35s and 30s should have a clinical breast exam (CBE) by a health care provider as part of a regular health exam every 1 to 3 years. After age 108, women should have a CBE every year. Starting at age 1, women should consider having a mammogram (breast X-ray test) every year. Women who have a family history of breast cancer should talk to their health care provider about genetic screening. Women at a high risk of breast cancer should talk to their health care providers about having an MRI and a mammogram every year.  Breast cancer gene (BRCA)-related cancer risk assessment is recommended for women who have family members with BRCA-related cancers. BRCA-related cancers include breast, ovarian, tubal, and peritoneal cancers. Having family members with these cancers may be associated with an increased risk for harmful changes  (mutations) in the breast cancer genes BRCA1 and BRCA2. Results of the assessment will determine the need for genetic counseling and BRCA1 and BRCA2 testing.  Your health care provider may recommend that you be screened regularly for cancer of the pelvic organs (ovaries, uterus, and vagina). This screening involves a pelvic examination, including checking for microscopic changes to the surface of your cervix (Pap test). You may be encouraged to have this screening done every 3 years, beginning at age 73.  For women ages 65-65, health care providers may recommend pelvic exams and Pap testing every 3 years, or they may recommend the Pap and pelvic exam, combined with testing for human papilloma virus (HPV), every 5 years. Some types of HPV increase your risk of cervical cancer. Testing for HPV may also be done on women of any age with unclear Pap test results.  Other health care providers may not recommend any screening for nonpregnant women who are considered low risk for pelvic cancer and who do not have symptoms. Ask your health care provider  if a screening pelvic exam is right for you.  If you have had past treatment for cervical cancer or a condition that could lead to cancer, you need Pap tests and screening for cancer for at least 20 years after your treatment. If Pap tests have been discontinued, your risk factors (such as having a new sexual partner) need to be reassessed to determine if screening should resume. Some women have medical problems that increase the chance of getting cervical cancer. In these cases, your health care provider may recommend more frequent screening and Pap tests.  Colorectal cancer can be detected and often prevented. Most routine colorectal cancer screening begins at the age of 34 years and continues through age 8 years. However, your health care provider may recommend screening at an earlier age if you have risk factors for colon cancer. On a yearly basis, your health  care provider may provide home test kits to check for hidden blood in the stool. Use of a small camera at the end of a tube, to directly examine the colon (sigmoidoscopy or colonoscopy), can detect the earliest forms of colorectal cancer. Talk to your health care provider about this at age 39, when routine screening begins. Direct exam of the colon should be repeated every 5-10 years through age 71 years, unless early forms of precancerous polyps or small growths are found.  People who are at an increased risk for hepatitis B should be screened for this virus. You are considered at high risk for hepatitis B if:  You were born in a country where hepatitis B occurs often. Talk with your health care provider about which countries are considered high risk.  Your parents were born in a high-risk country and you have not received a shot to protect against hepatitis B (hepatitis B vaccine).  You have HIV or AIDS.  You use needles to inject street drugs.  You live with, or have sex with, someone who has hepatitis B.  You get hemodialysis treatment.  You take certain medicines for conditions like cancer, organ transplantation, and autoimmune conditions.  Hepatitis C blood testing is recommended for all people born from [birth year] through 1965 and any individual with known risks for hepatitis C.  Practice safe sex. Use condoms and avoid high-risk sexual practices to reduce the spread of sexually transmitted infections (STIs). STIs include gonorrhea, chlamydia, syphilis, trichomonas, herpes, HPV, and human immunodeficiency virus (HIV). Herpes, HIV, and HPV are viral illnesses that have no cure. They can result in disability, cancer, and death.  You should be screened for sexually transmitted illnesses (STIs) including gonorrhea and chlamydia if:  You are sexually active and are younger than 24 years.  You are older than 24 years and your health care provider tells you that you are at risk for this type of  infection.  Your sexual activity has changed since you were last screened and you are at an increased risk for chlamydia or gonorrhea. Ask your health care provider if you are at risk.  If you are at risk of being infected with HIV, it is recommended that you take a prescription medicine daily to prevent HIV infection. This is called preexposure prophylaxis (PrEP). You are considered at risk if:  You are sexually active and do not regularly use condoms or know the HIV status of your partner(s).  You take drugs by injection.  You are sexually active with a partner who has HIV.  Talk with your health care provider about whether you are at  high risk of being infected with HIV. If you choose to begin PrEP, you should first be tested for HIV. You should then be tested every 3 months for as long as you are taking PrEP.  Osteoporosis is a disease in which the bones lose minerals and strength with aging. This can result in serious bone fractures or breaks. The risk of osteoporosis can be identified using a bone density scan. Women ages 65 years and over and women at risk for fractures or osteoporosis should discuss screening with their health care providers. Ask your health care provider whether you should take a calcium supplement or vitamin D to reduce the rate of osteoporosis.  Menopause can be associated with physical symptoms and risks. Hormone replacement therapy is available to decrease symptoms and risks. You should talk to your health care provider about whether hormone replacement therapy is right for you.  Use sunscreen. Apply sunscreen liberally and repeatedly throughout the day. You should seek shade when your shadow is shorter than you. Protect yourself by wearing long sleeves, pants, a wide-brimmed hat, and sunglasses year round, whenever you are outdoors.  Once a month, do a whole body skin exam, using a mirror to look at the skin on your back. Tell your health care provider of new moles,  moles that have irregular borders, moles that are larger than a pencil eraser, or moles that have changed in shape or color.  Stay current with required vaccines (immunizations).  Influenza vaccine. All adults should be immunized every year.  Tetanus, diphtheria, and acellular pertussis (Td, Tdap) vaccine. Pregnant women should receive 1 dose of Tdap vaccine during each pregnancy. The dose should be obtained regardless of the length of time since the last dose. Immunization is preferred during the 27th-36th week of gestation. An adult who has not previously received Tdap or who does not know her vaccine status should receive 1 dose of Tdap. This initial dose should be followed by tetanus and diphtheria toxoids (Td) booster doses every 10 years. Adults with an unknown or incomplete history of completing a 3-dose immunization series with Td-containing vaccines should begin or complete a primary immunization series including a Tdap dose. Adults should receive a Td booster every 10 years.  Varicella vaccine. An adult without evidence of immunity to varicella should receive 2 doses or a second dose if she has previously received 1 dose. Pregnant females who do not have evidence of immunity should receive the first dose after pregnancy. This first dose should be obtained before leaving the health care facility. The second dose should be obtained 4-8 weeks after the first dose.  Human papillomavirus (HPV) vaccine. Females aged 13-26 years who have not received the vaccine previously should obtain the 3-dose series. The vaccine is not recommended for use in pregnant females. However, pregnancy testing is not needed before receiving a dose. If a female is found to be pregnant after receiving a dose, no treatment is needed. In that case, the remaining doses should be delayed until after the pregnancy. Immunization is recommended for any person with an immunocompromised condition through the age of 26 years if she  did not get any or all doses earlier. During the 3-dose series, the second dose should be obtained 4-8 weeks after the first dose. The third dose should be obtained 24 weeks after the first dose and 16 weeks after the second dose.  Zoster vaccine. One dose is recommended for adults aged 60 years or older unless certain conditions are present.    Measles, mumps, and rubella (MMR) vaccine. Adults born before 1957 generally are considered immune to measles and mumps. Adults born in 1957 or later should have 1 or more doses of MMR vaccine unless there is a contraindication to the vaccine or there is laboratory evidence of immunity to each of the three diseases. A routine second dose of MMR vaccine should be obtained at least 28 days after the first dose for students attending postsecondary schools, health care workers, or international travelers. People who received inactivated measles vaccine or an unknown type of measles vaccine during 1963-1967 should receive 2 doses of MMR vaccine. People who received inactivated mumps vaccine or an unknown type of mumps vaccine before 1979 and are at high risk for mumps infection should consider immunization with 2 doses of MMR vaccine. For females of childbearing age, rubella immunity should be determined. If there is no evidence of immunity, females who are not pregnant should be vaccinated. If there is no evidence of immunity, females who are pregnant should delay immunization until after pregnancy. Unvaccinated health care workers born before 1957 who lack laboratory evidence of measles, mumps, or rubella immunity or laboratory confirmation of disease should consider measles and mumps immunization with 2 doses of MMR vaccine or rubella immunization with 1 dose of MMR vaccine.  Pneumococcal 13-valent conjugate (PCV13) vaccine. When indicated, a person who is uncertain of his immunization history and has no record of immunization should receive the PCV13 vaccine. All adults  65 years of age and older should receive this vaccine. An adult aged 19 years or older who has certain medical conditions and has not been previously immunized should receive 1 dose of PCV13 vaccine. This PCV13 should be followed with a dose of pneumococcal polysaccharide (PPSV23) vaccine. Adults who are at high risk for pneumococcal disease should obtain the PPSV23 vaccine at least 8 weeks after the dose of PCV13 vaccine. Adults older than 72 years of age who have normal immune system function should obtain the PPSV23 vaccine dose at least 1 year after the dose of PCV13 vaccine.  Pneumococcal polysaccharide (PPSV23) vaccine. When PCV13 is also indicated, PCV13 should be obtained first. All adults aged 65 years and older should be immunized. An adult younger than age 65 years who has certain medical conditions should be immunized. Any person who resides in a nursing home or long-term care facility should be immunized. An adult smoker should be immunized. People with an immunocompromised condition and certain other conditions should receive both PCV13 and PPSV23 vaccines. People with human immunodeficiency virus (HIV) infection should be immunized as soon as possible after diagnosis. Immunization during chemotherapy or radiation therapy should be avoided. Routine use of PPSV23 vaccine is not recommended for American Indians, [place] Natives, or people younger than 65 years unless there are medical conditions that require PPSV23 vaccine. When indicated, people who have unknown immunization and have no record of immunization should receive PPSV23 vaccine. One-time revaccination 5 years after the first dose of PPSV23 is recommended for people aged 19-64 years who have chronic kidney failure, nephrotic syndrome, asplenia, or immunocompromised conditions. People who received 1-2 doses of PPSV23 before age 65 years should receive another dose of PPSV23 vaccine at age 65 years or later if at least 5 years have passed since  the previous dose. Doses of PPSV23 are not needed for people immunized with PPSV23 at or after age 65 years.  Meningococcal vaccine. Adults with asplenia or persistent complement component deficiencies should receive 2 doses of quadrivalent meningococcal   conjugate (MenACWY-D) vaccine. The doses should be obtained at least 2 months apart. Microbiologists working with certain meningococcal bacteria, military recruits, people at risk during an outbreak, and people who travel to or live in countries with a high rate of meningitis should be immunized. A first-year college student up through age 21 years who is living in a residence hall should receive a dose if she did not receive a dose on or after her 16th birthday. Adults who have certain high-risk conditions should receive one or more doses of vaccine.  Hepatitis A vaccine. Adults who wish to be protected from this disease, have certain high-risk conditions, work with hepatitis A-infected animals, work in hepatitis A research labs, or travel to or work in countries with a high rate of hepatitis A should be immunized. Adults who were previously unvaccinated and who anticipate close contact with an international adoptee during the first 60 days after arrival in the [place] from a country with a high rate of hepatitis A should be immunized.  Hepatitis B vaccine. Adults who wish to be protected from this disease, have certain high-risk conditions, may be exposed to blood or other infectious body fluids, are household contacts or sex partners of hepatitis B positive people, are clients or workers in certain care facilities, or travel to or work in countries with a high rate of hepatitis B should be immunized.  Haemophilus influenzae type b (Hib) vaccine. A previously unvaccinated person with asplenia or sickle cell disease or having a scheduled splenectomy should receive 1 dose of Hib vaccine. Regardless of previous immunization, a recipient of a  hematopoietic stem cell transplant should receive a 3-dose series 6-12 months after her successful transplant. Hib vaccine is not recommended for adults with HIV infection. Preventive Services / Frequency Ages 19 to 39 years  Blood pressure check.** / Every 3-5 years.  Lipid and cholesterol check.** / Every 5 years beginning at age 20.  Clinical breast exam.** / Every 3 years for women in their 20s and 30s.  BRCA-related cancer risk assessment.** / For women who have family members with a BRCA-related cancer (breast, ovarian, tubal, or peritoneal cancers).  Pap test.** / Every 2 years from ages 21 through 29. Every 3 years starting at age 30 through age 65 or 70 with a history of 3 consecutive normal Pap tests.  HPV screening.** / Every 3 years from ages 30 through ages 65 to 70 with a history of 3 consecutive normal Pap tests.  Hepatitis C blood test.** / For any individual with known risks for hepatitis C.  Skin self-exam. / Monthly.  Influenza vaccine. / Every year.  Tetanus, diphtheria, and acellular pertussis (Tdap, Td) vaccine.** / Consult your health care provider. Pregnant women should receive 1 dose of Tdap vaccine during each pregnancy. 1 dose of Td every 10 years.  Varicella vaccine.** / Consult your health care provider. Pregnant females who do not have evidence of immunity should receive the first dose after pregnancy.  HPV vaccine. / 3 doses over 6 months, if 26 and younger. The vaccine is not recommended for use in pregnant females. However, pregnancy testing is not needed before receiving a dose.  Measles, mumps, rubella (MMR) vaccine.** / You need at least 1 dose of MMR if you were born in 1957 or later. You may also need a 2nd dose. For females of childbearing age, rubella immunity should be determined. If there is no evidence of immunity, females who are not pregnant should be vaccinated. If there   is no evidence of immunity, females who are pregnant should delay  immunization until after pregnancy.  Pneumococcal 13-valent conjugate (PCV13) vaccine.** / Consult your health care provider.  Pneumococcal polysaccharide (PPSV23) vaccine.** / 1 to 2 doses if you smoke cigarettes or if you have certain conditions.  Meningococcal vaccine.** / 1 dose if you are age 19 to 21 years and a first-year college student living in a residence hall, or have one of several medical conditions, you need to get vaccinated against meningococcal disease. You may also need additional booster doses.  Hepatitis A vaccine.** / Consult your health care provider.  Hepatitis B vaccine.** / Consult your health care provider.  Haemophilus influenzae type b (Hib) vaccine.** / Consult your health care provider. Ages 40 to 64 years  Blood pressure check.** / Every year.  Lipid and cholesterol check.** / Every 5 years beginning at age 20 years.  Lung cancer screening. / Every year if you are aged 55-80 years and have a 30-pack-year history of smoking and currently smoke or have quit within the past 15 years. Yearly screening is stopped once you have quit smoking for at least 15 years or develop a health problem that would prevent you from having lung cancer treatment.  Clinical breast exam.** / Every year after age 40 years.  BRCA-related cancer risk assessment.** / For women who have family members with a BRCA-related cancer (breast, ovarian, tubal, or peritoneal cancers).  Mammogram.** / Every year beginning at age 40 years and continuing for as long as you are in good health. Consult with your health care provider.  Pap test.** / Every 3 years starting at age 30 years through age 65 or 70 years with a history of 3 consecutive normal Pap tests.  HPV screening.** / Every 3 years from ages 30 years through ages 65 to 70 years with a history of 3 consecutive normal Pap tests.  Fecal occult blood test (FOBT) of stool. / Every year beginning at age 50 years and continuing until age  75 years. You may not need to do this test if you get a colonoscopy every 10 years.  Flexible sigmoidoscopy or colonoscopy.** / Every 5 years for a flexible sigmoidoscopy or every 10 years for a colonoscopy beginning at age 50 years and continuing until age 75 years.  Hepatitis C blood test.** / For all people born from 1945 through 1965 and any individual with known risks for hepatitis C.  Skin self-exam. / Monthly.  Influenza vaccine. / Every year.  Tetanus, diphtheria, and acellular pertussis (Tdap/Td) vaccine.** / Consult your health care provider. Pregnant women should receive 1 dose of Tdap vaccine during each pregnancy. 1 dose of Td every 10 years.  Varicella vaccine.** / Consult your health care provider. Pregnant females who do not have evidence of immunity should receive the first dose after pregnancy.  Zoster vaccine.** / 1 dose for adults aged 60 years or older.  Measles, mumps, rubella (MMR) vaccine.** / You need at least 1 dose of MMR if you were born in 1957 or later. You may also need a second dose. For females of childbearing age, rubella immunity should be determined. If there is no evidence of immunity, females who are not pregnant should be vaccinated. If there is no evidence of immunity, females who are pregnant should delay immunization until after pregnancy.  Pneumococcal 13-valent conjugate (PCV13) vaccine.** / Consult your health care provider.  Pneumococcal polysaccharide (PPSV23) vaccine.** / 1 to 2 doses if you smoke cigarettes or if   you have certain conditions.  Meningococcal vaccine.** / Consult your health care provider.  Hepatitis A vaccine.** / Consult your health care provider.  Hepatitis B vaccine.** / Consult your health care provider.  Haemophilus influenzae type b (Hib) vaccine.** / Consult your health care provider. Ages 32 years and over  Blood pressure check.** / Every year.  Lipid and cholesterol check.** / Every 5 years beginning at age 31  years.  Lung cancer screening. / Every year if you are aged 39-80 years and have a 30-pack-year history of smoking and currently smoke or have quit within the past 15 years. Yearly screening is stopped once you have quit smoking for at least 15 years or develop a health problem that would prevent you from having lung cancer treatment.  Clinical breast exam.** / Every year after age 79 years.  BRCA-related cancer risk assessment.** / For women who have family members with a BRCA-related cancer (breast, ovarian, tubal, or peritoneal cancers).  Mammogram.** / Every year beginning at age 35 years and continuing for as long as you are in good health. Consult with your health care provider.  Pap test.** / Every 3 years starting at age 11 years through age 35 or 48 years with 3 consecutive normal Pap tests. Testing can be stopped between 65 and 70 years with 3 consecutive normal Pap tests and no abnormal Pap or HPV tests in the past 10 years.  HPV screening.** / Every 3 years from ages 50 years through ages 4 or 70 years with a history of 3 consecutive normal Pap tests. Testing can be stopped between 65 and 70 years with 3 consecutive normal Pap tests and no abnormal Pap or HPV tests in the past 10 years.  Fecal occult blood test (FOBT) of stool. / Every year beginning at age 73 years and continuing until age 30 years. You may not need to do this test if you get a colonoscopy every 10 years.  Flexible sigmoidoscopy or colonoscopy.** / Every 5 years for a flexible sigmoidoscopy or every 10 years for a colonoscopy beginning at age 3 years and continuing until age 68 years.  Hepatitis C blood test.** / For all people born from [birth year] through 1967 and any individual with known risks for hepatitis C.  Osteoporosis screening.** / A one-time screening for women ages 29 years and over and women at risk for fractures or osteoporosis.  Skin self-exam. / Monthly.  Influenza vaccine. / Every year.  Tetanus,  diphtheria, and acellular pertussis (Tdap/Td) vaccine.** / 1 dose of Td every 10 years.  Varicella vaccine.** / Consult your health care provider.  Zoster vaccine.** / 1 dose for adults aged 8 years or older.  Pneumococcal 13-valent conjugate (PCV13) vaccine.** / Consult your health care provider.  Pneumococcal polysaccharide (PPSV23) vaccine.** / 1 dose for all adults aged 55 years and older.  Meningococcal vaccine.** / Consult your health care provider.  Hepatitis A vaccine.** / Consult your health care provider.  Hepatitis B vaccine.** / Consult your health care provider.  Haemophilus influenzae type b (Hib) vaccine.** / Consult your health care provider. ** Family history and personal history of risk and conditions may change your health care provider's recommendations.   This information is not intended to replace advice given to you by your health care provider. Make sure you discuss any questions you have with your health care provider.   Document Released: 03/14/2001 Document Revised: 02/06/2014 Document Reviewed: 06/13/2010 Elsevier Interactive Patient Education [employer].

## 2015-09-06 ENCOUNTER — Telehealth: Payer: Self-pay | Admitting: Family Medicine

## 2015-09-06 NOTE — Telephone Encounter (Signed)
Called cell/home number left message to call back. 

## 2015-09-06 NOTE — Telephone Encounter (Signed)
Relation to HL:KTGY Call back [phone]  Reason for call:  Patient wanted to inform PCP colonoscopy was completed  09/23/2013 and would like to discuss.

## 2015-09-07 NOTE — Telephone Encounter (Signed)
Called left message to called left message to call back.

## 2015-09-07 NOTE — Telephone Encounter (Signed)
Called left message to call back 

## 2015-09-09 NOTE — Telephone Encounter (Signed)
Called left message to call back 

## 2015-09-09 NOTE — Telephone Encounter (Signed)
Called the patient left msg. Unable to speak to the patient.

## 2015-09-10 ENCOUNTER — Other Ambulatory Visit: Payer: Self-pay | Admitting: Family Medicine

## 2015-09-10 DIAGNOSIS — E559 Vitamin D deficiency, unspecified: Secondary | ICD-10-CM

## 2015-09-10 NOTE — Telephone Encounter (Signed)
Advise on this refill/ labs done in July for vitamin D.  Not sure if you want her to remain on this strength or just do OTC.

## 2015-09-12 ENCOUNTER — Encounter: Payer: Self-pay | Admitting: Family Medicine

## 2015-09-12 DIAGNOSIS — M546 Pain in thoracic spine: Secondary | ICD-10-CM | POA: Insufficient documentation

## 2015-09-12 DIAGNOSIS — Z0000 Encounter for general adult medical examination without abnormal findings: Secondary | ICD-10-CM | POA: Insufficient documentation

## 2015-09-12 HISTORY — DX: Encounter for general adult medical examination without abnormal findings: Z00.00

## 2015-09-12 NOTE — Assessment & Plan Note (Signed)
Encouraged to get adequate exercise, calcium and vitamin d intake 

## 2015-09-12 NOTE — Assessment & Plan Note (Signed)
Avoid offending foods, start probiotics. Do not eat large meals in late evening and consider raising head of bed.  

## 2015-09-12 NOTE — Assessment & Plan Note (Addendum)
Encouraged to stay active. Try moist heat and topical treatments. Consider referral and/or PT if persists.   Due to this atypical posterior chest pain, ekg was obtained, no acute concerns identified.

## 2015-09-12 NOTE — Assessment & Plan Note (Addendum)
Patient encouraged to maintain heart healthy diet, regular exercise, adequate sleep. Consider daily probiotics. Take medications as prescribed. Referred for colonoscopy, Td given

## 2015-09-12 NOTE — Assessment & Plan Note (Signed)
Encouraged DASH diet, decrease po intake and increase exercise as tolerated. Needs 7-8 hours of sleep nightly. Avoid trans fats, eat small, frequent meals every 4-5 hours with lean proteins, complex carbs and healthy fats. Minimize simple carbs, GMO foods. 

## 2015-09-12 NOTE — Assessment & Plan Note (Signed)
Encouraged heart healthy diet, increase exercise, avoid trans fats, consider a krill oil cap daily 

## 2015-09-12 NOTE — Progress Notes (Addendum)
Patient ID: TRENISHA LAFAVOR, female   DOB: 1945-08-01, 70 y.o.   MRN: [id number]   Subjective:    Patient ID: TENIOLA TSENG, female    DOB: 1945/03/28, 70 y.o.   MRN: [id number]  Chief Complaint  Patient presents with  . Annual Exam    HPI Patient is in today for annual exam and follow up on numerous medical concerns. She burned her left arm with bacon grease but it is healing well. She also has had a flare in right great toe pain c/w her gout outbreaks, it is also improving. No other acute complaints or recent illness. No hospitalizations. No difficulty with ADLs. Denies CP/palp/SOB/HA/congestion/fevers/GI or GU c/o. Taking meds as prescribed She does endorse posterior chest pain/thoracic back pain worse with movement and without associated symptoms  Past Medical History:  Diagnosis Date  . Allergic state 03/12/2015  . Arthritis   . Colon polyp 11/19/2014  . Dermatitis 03/12/2015  . GERD (gastroesophageal reflux disease)    occ  . Gout 03/12/2015  . H/O measles   . H/O mumps   . Headache(784.0)    migraines  . History of chicken pox 11/23/2014  . Lung cancer ([place])   . Migraine 11/23/2014  . Obesity 11/23/2014  . Pneumonia    child  . Preventative health care 09/12/2015  . Renal cell cancer ([place])    renal cell ca dx 9/01 and 8/08;  . Shortness of breath    occ  . Skin lesion of breast 03/12/2015    Past Surgical History:  Procedure Laterality Date  . ABDOMINAL HYSTERECTOMY    . APPENDECTOMY    . CARDIAC CATHETERIZATION     yrs ago neg  . CHOLECYSTECTOMY    . LUNG CANCER SURGERY Left 08  . RENAL MASS EXCISION Left 01  . THORACOTOMY Left 05/09/2012   Procedure: THORACOTOMY MAJOR;  Surgeon: Gaye Pollack, MD;  Location: [facility];  Service: Thoracic;  Laterality: Left;  Marland Kitchen VIDEO BRONCHOSCOPY N/A 05/09/2012   Procedure: VIDEO BRONCHOSCOPY;  Surgeon: Gaye Pollack, MD;  Location: [facility] OR;  Service: Thoracic;  Laterality: N/A;  . WEDGE RESECTION Left 05/09/2012   Procedure: LEFT  UPPER LOBE WEDGE RESECTION;  Surgeon: Gaye Pollack, MD;  Location: [facility] OR;  Service: Thoracic;  Laterality: Left;    Family History  Problem Relation Age of Onset  . Heart failure Father   . Asthma Brother   . Asthma Daughter     Social History   Social History  . Marital status: Married    Spouse name: N/A  . Number of children: N/A  . Years of education: N/A   Occupational History  . Not on file.   Social History Main Topics  . Smoking status: Former Smoker    Packs/day: 0.50    Years: 35.00    Types: Cigarettes    Quit date: 05/08/2006  . Smokeless tobacco: Not on file  . Alcohol use No  . Drug use: No  . Sexual activity: Not on file     Comment: lives with husband, no dietary restrictions.    Other Topics Concern  . Not on file   Social History Narrative  . No narrative on file    Outpatient Medications Prior to Visit  Medication Sig Dispense Refill  . atorvastatin (LIPITOR) 10 MG tablet Take 1 tablet (10 mg total) by mouth every other day. 45 tablet 0  . Biotin 1 MG CAPS Take by mouth.    . Calcium  Carbonate (CALTRATE 600) 1500 MG TABS Take by mouth.    . cholecalciferol (VITAMIN D) 1000 UNITS tablet Take 2,000 Units by mouth daily.    . colchicine 0.6 MG tablet Take 1 tablet (0.6 mg total) by mouth daily as needed. For gout 30 tablet 1  . diphenhydramine-acetaminophen (TYLENOL PM) 25-500 MG TABS Take 1 tablet by mouth at bedtime as needed. For insomnia    . famotidine (PEPCID) 20 MG tablet Take 20 mg by mouth at bedtime.    . fish oil-omega-3 fatty acids 1000 MG capsule Take 2 g by mouth daily.    Marland Kitchen glucosamine-chondroitin 500-400 MG tablet Take 1 tablet by mouth daily.    . meclizine (ANTIVERT) 12.5 MG tablet Take 1 tablet (12.5 mg total) by mouth 3 (three) times daily as needed for dizziness. As needed for   Dizziness or nausea 30 tablet 1  . omeprazole (PRILOSEC OTC) 20 MG tablet Take 20 mg by mouth daily before breakfast.    . Vitamin D, Ergocalciferol,  (DRISDOL) 50000 units CAPS capsule Take 1 capsule (50,000 Units total) by mouth every 7 (seven) days. Take for 12 weeks 4 capsule 4   No facility-administered medications prior to visit.     No Known Allergies  Review of Systems  Constitutional: Negative for fever and malaise/fatigue.  HENT: Negative for congestion.   Eyes: Negative for blurred vision.  Respiratory: Negative for shortness of breath.   Cardiovascular: Positive for chest pain. Negative for palpitations and leg swelling.  Gastrointestinal: Negative for abdominal pain, blood in stool and nausea.  Genitourinary: Negative for dysuria and frequency.  Musculoskeletal: Positive for back pain. Negative for falls.  Skin: Positive for rash.  Neurological: Negative for dizziness, loss of consciousness and headaches.  Endo/Heme/Allergies: Negative for environmental allergies.  Psychiatric/Behavioral: Negative for depression. The patient is not nervous/anxious.        Objective:    Physical Exam  Constitutional: She is oriented to person, place, and time. She appears well-developed and well-nourished. No distress.  HENT:  Head: Normocephalic and atraumatic.  Eyes: Conjunctivae are normal.  Neck: Neck supple. No thyromegaly present.  Cardiovascular: Normal rate, regular rhythm and normal heart sounds.   No murmur heard. Pulmonary/Chest: Effort normal and breath sounds normal. No respiratory distress.  Abdominal: Soft. Bowel sounds are normal. She exhibits no distension and no mass. There is no tenderness.  Musculoskeletal: She exhibits no edema.  Lymphadenopathy:    She has no cervical adenopathy.  Neurological: She is alert and oriented to person, place, and time.  Skin: Skin is warm and dry.  Psychiatric: She has a normal mood and affect. Her behavior is normal.    BP 120/68 (BP Location: Left Arm, Patient Position: Sitting, Cuff Size: Large)   Pulse 90   Temp 98.4 F (36.9 C) (Oral)   Ht 5' 4"  (1.626 m)   Wt 225 lb  4 oz (102.2 kg)   SpO2 94%   BMI 38.66 kg/m  Wt Readings from Last 3 Encounters:  08/31/15 225 lb 4 oz (102.2 kg)  04/23/15 227 lb 1.6 oz (103 kg)  03/12/15 225 lb (102.1 kg)     Lab Results  Component Value Date   WBC 8.9 08/25/2015   HGB 14.1 08/25/2015   HCT 42.0 08/25/2015   PLT 290.0 08/25/2015   GLUCOSE 120 (H) 08/25/2015   CHOL 190 08/25/2015   TRIG 311.0 (H) 08/25/2015   HDL 51.70 08/25/2015   LDLDIRECT 95.0 08/25/2015   [place] (H) 01/31/2007  117        Total Cholesterol/HDL:CHD Risk Coronary Heart Disease Risk Table                     Men   Women  1/2 Average Risk   3.4   3.3  Average Risk       5.0   4.4  2 X Average Risk   9.6   7.1  3 X Average Risk  23.4   11.0        Use the calculated Patient Ratio above and the CHD Risk Table to determine the patient's CHD Risk.        ATP III CLASSIFICATION (LDL):  <100     mg/dL   Optimal  100-129  mg/dL   Near or Above                    Optimal  130-159  mg/dL   Borderline  160-189  mg/dL   High  >190     mg/dL   Very High   ALT 46 (H) 08/25/2015   AST 38 (H) 08/25/2015   NA 139 08/25/2015   K 4.3 08/25/2015   CL 103 08/25/2015   CREATININE 1.07 08/25/2015   BUN 16 08/25/2015   CO2 28 08/25/2015   TSH 2.34 08/25/2015   INR 0.99 05/07/2012    Lab Results  Component Value Date   TSH 2.34 08/25/2015   Lab Results  Component Value Date   WBC 8.9 08/25/2015   HGB 14.1 08/25/2015   HCT 42.0 08/25/2015   MCV 90.6 08/25/2015   PLT 290.0 08/25/2015   Lab Results  Component Value Date   NA 139 08/25/2015   K 4.3 08/25/2015   CHLORIDE 106 04/21/2015   CO2 28 08/25/2015   GLUCOSE 120 (H) 08/25/2015   BUN 16 08/25/2015   CREATININE 1.07 08/25/2015   BILITOT 0.7 08/25/2015   ALKPHOS 62 08/25/2015   AST 38 (H) 08/25/2015   ALT 46 (H) 08/25/2015   PROT 7.5 08/25/2015   ALBUMIN 4.2 08/25/2015   CALCIUM 10.2 08/25/2015   ANIONGAP 10 04/21/2015   EGFR 46 (L) 04/21/2015   GFR 53.53 (L)  08/25/2015   Lab Results  Component Value Date   CHOL 190 08/25/2015   Lab Results  Component Value Date   HDL 51.70 08/25/2015   Lab Results  Component Value Date   LDLCALC (H) 01/31/2007    117        Total Cholesterol/HDL:CHD Risk Coronary Heart Disease Risk Table                     Men   Women  1/2 Average Risk   3.4   3.3  Average Risk       5.0   4.4  2 X Average Risk   9.6   7.1  3 X Average Risk  23.4   11.0        Use the calculated Patient Ratio above and the CHD Risk Table to determine the patient's CHD Risk.        ATP III CLASSIFICATION (LDL):  <100     mg/dL   Optimal  100-129  mg/dL   Near or Above                    Optimal  130-159  mg/dL   Borderline  160-189  mg/dL   High  >190  mg/dL   Very High   Lab Results  Component Value Date   TRIG 311.0 (H) 08/25/2015   Lab Results  Component Value Date   CHOLHDL 4 08/25/2015   No results found for: HGBA1C     Assessment & Plan:   Problem List Items Addressed This Visit    GERD (gastroesophageal reflux disease)    Avoid offending foods, start probiotics. Do not eat large meals in late evening and consider raising head of bed.       Hyperlipidemia    Encouraged heart healthy diet, increase exercise, avoid trans fats, consider a krill oil cap daily      Osteopenia    Encouraged to get adequate exercise, calcium and vitamin d intake      Morbid obesity ([place])    Encouraged DASH diet, decrease po intake and increase exercise as tolerated. Needs 7-8 hours of sleep nightly. Avoid trans fats, eat small, frequent meals every 4-5 hours with lean proteins, complex carbs and healthy fats. Minimize simple carbs, GMO foods.      Bilateral thoracic back pain    Encouraged to stay active. Try moist heat and topical treatments. Consider referral and/or PT if persists.   Due to this atypical posterior chest pain, ekg was obtained, no acute concerns identified.      Relevant Orders   DG Thoracic Spine  W/[place]   Preventative health care - Primary    Patient encouraged to maintain heart healthy diet, regular exercise, adequate sleep. Consider daily probiotics. Take medications as prescribed. Referred for colonoscopy, Td given       Other Visit Diagnoses    Colon cancer screening       Relevant Orders   Ambulatory referral to Gastroenterology   Other chest pain       Relevant Orders   EKG 12-Lead (Completed)   Need for TD vaccine       Relevant Orders   Td : Tetanus/diphtheria >7yo Preservative  free (Completed)      I am having Ms. Pugsley maintain her fish oil-omega-3 fatty acids, diphenhydramine-acetaminophen, glucosamine-chondroitin, Biotin, calcium carbonate, meclizine, cholecalciferol, omeprazole, famotidine, colchicine, and atorvastatin.  Meds ordered this encounter  Medications  . DISCONTD: Zoster Vaccine Live, PF, (ZOSTAVAX) 61607 UNT/0.65ML injection    Sig: Inject 19,400 Units into the skin once.    Dispense:  1 each    Refill:  0  . Zoster Vaccine Live, PF, (ZOSTAVAX) 37106 UNT/0.65ML injection    Sig: Inject 19,400 Units into the skin once.    Dispense:  1 each    Refill:  0     Penni Homans, MD

## 2015-10-13 ENCOUNTER — Telehealth: Payer: Self-pay | Admitting: Family Medicine

## 2015-10-13 NOTE — Telephone Encounter (Signed)
Pt came in office to bring copies of last colonoscopy report. Pt has original and does not need copy returned to her. Last colonoscopy was 09/23/13. Pt wanted to notify provider of date and that she does not need screening at this time.

## 2015-10-14 ENCOUNTER — Encounter: Payer: Self-pay | Admitting: Family Medicine

## 2015-10-14 NOTE — Telephone Encounter (Signed)
Results documented.

## 2015-10-14 NOTE — Telephone Encounter (Signed)
Please abstract into results

## 2015-10-18 ENCOUNTER — Encounter: Payer: Self-pay | Admitting: Family Medicine

## 2015-11-02 ENCOUNTER — Other Ambulatory Visit (HOSPITAL_BASED_OUTPATIENT_CLINIC_OR_DEPARTMENT_OTHER): Payer: Medicare Other

## 2015-11-02 ENCOUNTER — Encounter (HOSPITAL_COMMUNITY): Payer: Self-pay

## 2015-11-02 ENCOUNTER — Other Ambulatory Visit: Payer: Self-pay | Admitting: Oncology

## 2015-11-02 ENCOUNTER — Ambulatory Visit (HOSPITAL_COMMUNITY)
Admission: RE | Admit: 2015-11-02 | Discharge: 2015-11-02 | Disposition: A | Discharge status: Home / Self Care | Payer: Medicare Other | Source: Ambulatory Visit | Attending: Oncology | Admitting: Oncology

## 2015-11-02 DIAGNOSIS — K760 Fatty (change of) liver, not elsewhere classified: Secondary | ICD-10-CM | POA: Insufficient documentation

## 2015-11-02 DIAGNOSIS — I2510 Atherosclerotic heart disease of native coronary artery without angina pectoris: Secondary | ICD-10-CM | POA: Insufficient documentation

## 2015-11-02 DIAGNOSIS — I700 Atherosclerosis of aorta: Secondary | ICD-10-CM | POA: Diagnosis not present

## 2015-11-02 DIAGNOSIS — C649 Malignant neoplasm of unspecified kidney, except renal pelvis: Secondary | ICD-10-CM | POA: Insufficient documentation

## 2015-11-02 DIAGNOSIS — Z905 Acquired absence of kidney: Secondary | ICD-10-CM | POA: Insufficient documentation

## 2015-11-02 LAB — CBC WITH DIFFERENTIAL/PLATELET
BASO%: 0.5 % (ref 0.0–2.0)
BASOS ABS: 0 10*3/uL (ref 0.0–0.1)
EOS ABS: 0.3 10*3/uL (ref 0.0–0.5)
EOS%: 3.4 % (ref 0.0–7.0)
HEMATOCRIT: 45.6 % (ref 34.8–46.6)
HEMOGLOBIN: 15.1 g/dL (ref 11.6–15.9)
LYMPH#: 2.4 10*3/uL (ref 0.9–3.3)
LYMPH%: 25.7 % (ref 14.0–49.7)
MCH: 30.5 pg (ref 25.1–34.0)
MCHC: 33.1 g/dL (ref 31.5–36.0)
MCV: 92.3 fL (ref 79.5–101.0)
MONO#: 0.6 10*3/uL (ref 0.1–0.9)
MONO%: 6.3 % (ref 0.0–14.0)
NEUT%: 64.1 % (ref 38.4–76.8)
NEUTROS ABS: 5.9 10*3/uL (ref 1.5–6.5)
PLATELETS: 251 10*3/uL (ref 145–400)
RBC: 4.93 10*6/uL (ref 3.70–5.45)
RDW: 14 % (ref 11.2–14.5)
WBC: 9.1 10*3/uL (ref 3.9–10.3)

## 2015-11-02 LAB — COMPREHENSIVE METABOLIC PANEL
ALBUMIN: 3.9 g/dL (ref 3.5–5.0)
ALK PHOS: 76 U/L (ref 40–150)
ALT: 61 U/L — ABNORMAL HIGH (ref 0–55)
ANION GAP: 9 meq/L (ref 3–11)
AST: 45 U/L — ABNORMAL HIGH (ref 5–34)
BILIRUBIN TOTAL: 0.54 mg/dL (ref 0.20–1.20)
BUN: 23.7 mg/dL (ref 7.0–26.0)
CALCIUM: 10.2 mg/dL (ref 8.4–10.4)
CO2: 24 mEq/L (ref 22–29)
Chloride: 108 mEq/L (ref 98–109)
Creatinine: 1.1 mg/dL (ref 0.6–1.1)
EGFR: 48 mL/min/{1.73_m2} — AB (ref >90)
GLUCOSE: 134 mg/dL (ref 70–140)
Potassium: 4.8 mEq/L (ref 3.5–5.1)
Sodium: 141 mEq/L (ref 136–145)
TOTAL PROTEIN: 7.9 g/dL (ref 6.4–8.3)

## 2015-11-02 MED ORDER — IOPAMIDOL (ISOVUE-300) INJECTION 61%
100.0000 mL | Freq: Once | INTRAVENOUS | Status: AC | PRN
  Administered 2015-11-02: 100 mL via INTRAVENOUS

## 2015-11-04 ENCOUNTER — Telehealth: Payer: Self-pay | Admitting: Oncology

## 2015-11-04 ENCOUNTER — Ambulatory Visit (HOSPITAL_BASED_OUTPATIENT_CLINIC_OR_DEPARTMENT_OTHER): Payer: Medicare Other | Admitting: Oncology

## 2015-11-04 VITALS — BP 134/89 | HR 75 | Temp 98.6°F | Resp 18 | Ht 64.0 in | Wt 223.9 lb

## 2015-11-04 DIAGNOSIS — C649 Malignant neoplasm of unspecified kidney, except renal pelvis: Secondary | ICD-10-CM

## 2015-11-04 NOTE — Progress Notes (Signed)
Hematology and Oncology Follow Up Visit  ZENOVIA JUSTMAN [id number] 11/30/1945 70 y.o. 11/04/2015 9:53 AM  CC: Nicanor Alcon, M.D.  Priscille Heidelberg Little, M.D.    Principle Diagnosis: This is a 70 year old female diagnosed with renal cell carcinoma, diagnosed in 2001, had a T2 disease and had recurrent disease in a lung nodule in 2008.  Prior Therapy:  She is status post left nephrectomy in 2001.  She developed a left lingular mass, status post VATS procedure.  Pathology revealed a recurrent renal cell carcinoma resected in October 2008.   She developed relapse disease in 2014 and subsequently underwent a VATS and left upper lobe wedge resection of a tumor done on 05/09/2012. Pathology confirmed another relapse.  Current therapy: Observation and surveillance.  Interim History: Mrs. Helman presents today for a followup visit by her self. Since the last visit, she reports No major changes in her health. She continues to have respiratory symptoms including dyspnea on exertion and congestion related to seasonal allergies. She denied any hemoptysis or chest pain. She continues to be active and performs activities of daily living without any decline. She is able to perform all of duties outside of her house without any decline. She does report a pain around her lung surgery site but have not changed dramatically.  She does not report any headaches, blurry vision, syncope or seizures. She does not report any fevers, chills or sweats. She has not reported any GI symptoms including abdominal pain or diarrhea. She has not reported any fever or hematochezia. She does not report any frequency urgency or hesitancy. She does not report any skeletal complaints. Does not report any lymphadenopathy or petechiae. Rest of the review of systems unremarkable.   Medications: I have reviewed the patient's current medications. Current Outpatient Prescriptions  Medication Sig Dispense Refill  . atorvastatin (LIPITOR) 10  MG tablet Take 1 tablet (10 mg total) by mouth every other day. 45 tablet 0  . Biotin 1 MG CAPS Take by mouth.    . Calcium Carbonate (CALTRATE 600) 1500 MG TABS Take by mouth.    . cholecalciferol (VITAMIN D) 1000 UNITS tablet Take 2,000 Units by mouth daily.    . colchicine 0.6 MG tablet Take 1 tablet (0.6 mg total) by mouth daily as needed. For gout 30 tablet 1  . diphenhydramine-acetaminophen (TYLENOL PM) 25-500 MG TABS Take 1 tablet by mouth at bedtime as needed. For insomnia    . famotidine (PEPCID) 20 MG tablet Take 20 mg by mouth at bedtime.    . fish oil-omega-3 fatty acids 1000 MG capsule Take 2 g by mouth daily.    Marland Kitchen glucosamine-chondroitin 500-400 MG tablet Take 1 tablet by mouth daily.    . meclizine (ANTIVERT) 12.5 MG tablet Take 1 tablet (12.5 mg total) by mouth 3 (three) times daily as needed for dizziness. As needed for   Dizziness or nausea 30 tablet 1  . omeprazole (PRILOSEC OTC) 20 MG tablet Take 20 mg by mouth daily before breakfast.    . Vitamin D, Ergocalciferol, (DRISDOL) 50000 units CAPS capsule TAKE 1 CAPSULE (50,000 UNITS TOTAL) BY MOUTH EVERY 7 (SEVEN) DAYS. TAKE FOR 12 WEEKS 4 capsule 4   No current facility-administered medications for this visit.      Allergies: No Known Allergies  Past Medical History, Surgical history, Social history, and Family History were reviewed and updated.   Physical Exam: Blood pressure 134/89, pulse 75, temperature 98.6 F (37 C), temperature source Oral, resp. rate 18, height  $'5\' 4"'l$  (1.626 m), weight 223 lb 14.4 oz (101.6 kg), SpO2 98 %. ECOG: 1 General appearance: Well-appearing woman without distress. Head: Normocephalic, without obvious abnormality no oral ulcers or lesions. Neck: no adenopathy Lymph nodes: Cervical, supraclavicular, and axillary nodes normal. Heart:regular rate and rhythm, S1, S2 normal, no murmur, click, rub or gallop Lung:chest clear, no wheezing, rales, normal symmetric air entry.  Abdomin: soft,  non-tender, without masses or organomegaly no shifting dullness or ascites. EXT:no erythema, induration, or nodules Skin: No rashes or lesions.  Lab Results: Lab Results  Component Value Date   WBC 9.1 11/02/2015   HGB 15.1 11/02/2015   HCT 45.6 11/02/2015   MCV 92.3 11/02/2015   PLT 251 11/02/2015     Chemistry      Component Value Date/Time   NA 141 11/02/2015 0849   K 4.8 11/02/2015 0849   CL 103 08/25/2015 1002   CL 107 04/16/2012 0923   CO2 24 11/02/2015 0849   BUN 23.7 11/02/2015 0849   CREATININE 1.1 11/02/2015 0849      Component Value Date/Time   CALCIUM 10.2 11/02/2015 0849   ALKPHOS 76 11/02/2015 0849   AST 45 (H) 11/02/2015 0849   ALT 61 (H) 11/02/2015 0849   BILITOT 0.54 11/02/2015 0849     CLINICAL DATA:  History of left nephrectomy for renal cell carcinoma.  EXAM: CT CHEST AND ABDOMEN WITH CONTRAST  TECHNIQUE: Multidetector CT imaging of the chest and abdomen was performed following the standard protocol during bolus administration of intravenous contrast.  CONTRAST:  176m ISOVUE-300 IOPAMIDOL (ISOVUE-300) INJECTION 61%<Contrast>1046mISOVUE-300 IOPAMIDOL (ISOVUE-300) INJECTION 61%  COMPARISON:  04/21/2015  FINDINGS: CT CHEST FINDINGS  Chest wall: No breast masses, supraclavicular or axillary lymphadenopathy. The thyroid gland is normal.  Cardiovascular: The heart is normal in size. No pericardial effusion. Normal caliber thoracic aorta. Moderate atherosclerotic calcifications at the aortic arch. Branch vessels are patent. No dissection. Stable three-vessel coronary artery calcifications.  Mediastinum/Nodes: No mediastinal or hilar mass or adenopathy. The esophagus is normal.  Lungs/Pleura: Examination is limited by a breathing motion artifact no obvious pulmonary lesions or acute pulmonary findings. Stable surgical scarring changes involving the left lung. No pleural effusion.  Musculoskeletal: No significant bony  findings.  CT ABDOMEN FINDINGS  Hepatobiliary: Diffuse fatty infiltration of the liver but no focal hepatic lesions or intrahepatic biliary dilatation. The gallbladder is surgically absent. No common bile duct dilatation.  Pancreas: No mass, inflammation or ductal dilatation.  Spleen: Normal size.  No focal lesions.  Adrenals/Urinary Tract: The left adrenal gland is normal and stable. Stable surgical changes from a left nephrectomy. No recurrent tumor. The right adrenal gland and right kidney are normal.  Stomach/Bowel: The stomach, duodenum, visualized small bowel and visualized colon are unremarkable.  Vascular/Lymphatic: Moderate aortic calcifications but no aneurysm or dissection. The branch vessels patent. The major venous structures are patent. Stable small scattered mesenteric and retroperitoneal lymph nodes but no mass or adenopathy.  Other: No ascites or abdominal wall hernia.  Musculoskeletal: No significant bony findings. Moderate degenerative changes involving the lumbar spine. Calcified disc protrusion noted at L3-4.  IMPRESSION: Status post left nephrectomy. No findings for recurrent disease, regional adenopathy or metastatic disease.  Examination lungs are limited by breathing motion artifact. No obvious pulmonary lesions.  Diffuse fatty infiltration of the liver.  Stable atherosclerotic calcifications involving the aorta and branch vessels including the coronary arteries.    Impression and Plan:   This is a pleasant [age]ear old female with the following  issues: 1. Renal cell carcinoma.  She has stage IV NED disease with lung nodules removed on 2 separate occasions the last of which removed in 04/2012. CT scan from 11/02/2015 was reviewed today and discussed with the patient. The scan did not show any evidence of relapsed disease. The plan is to continue with active surveillance and repeat imaging studies every 12 months. She will return for  clinical visit in 6 months and repeat the scan sooner if needed to. 2.     Diverticulosis: She has no evidence of diverticulitis and she had a colonoscopy recently without any complications. 3.     Pain around the thoracotomy site: Seems to be manageable at this time. Has not changed dramatically. 4.     Dyspnea on exertion: Likely related to her weight and deconditioning. Unchanged from previous examinations. 5.     Follow up: in 6 months for clinical visit.    XLEZVG,JFTNB, MD 10/5/20179:53 AM

## 2015-11-04 NOTE — Telephone Encounter (Signed)
Gave patient avs report and appointments for April  °

## 2015-11-26 ENCOUNTER — Other Ambulatory Visit: Payer: Self-pay | Admitting: Family Medicine

## 2016-01-31 HISTORY — PX: COLONOSCOPY: SHX174

## 2016-02-26 ENCOUNTER — Other Ambulatory Visit: Payer: Self-pay | Admitting: Family Medicine

## 2016-02-26 DIAGNOSIS — E559 Vitamin D deficiency, unspecified: Secondary | ICD-10-CM

## 2016-03-02 ENCOUNTER — Ambulatory Visit (INDEPENDENT_AMBULATORY_CARE_PROVIDER_SITE_OTHER): Payer: Medicare HMO | Admitting: Family Medicine

## 2016-03-02 ENCOUNTER — Other Ambulatory Visit: Payer: Self-pay | Admitting: Family Medicine

## 2016-03-02 VITALS — BP 128/76 | HR 87 | Temp 98.3°F | Wt 224.2 lb

## 2016-03-02 DIAGNOSIS — K219 Gastro-esophageal reflux disease without esophagitis: Secondary | ICD-10-CM

## 2016-03-02 DIAGNOSIS — Z1231 Encounter for screening mammogram for malignant neoplasm of breast: Secondary | ICD-10-CM

## 2016-03-02 DIAGNOSIS — G43109 Migraine with aura, not intractable, without status migrainosus: Secondary | ICD-10-CM

## 2016-03-02 DIAGNOSIS — M8580 Other specified disorders of bone density and structure, unspecified site: Secondary | ICD-10-CM

## 2016-03-02 DIAGNOSIS — R109 Unspecified abdominal pain: Secondary | ICD-10-CM

## 2016-03-02 DIAGNOSIS — E782 Mixed hyperlipidemia: Secondary | ICD-10-CM | POA: Diagnosis not present

## 2016-03-02 DIAGNOSIS — C649 Malignant neoplasm of unspecified kidney, except renal pelvis: Secondary | ICD-10-CM | POA: Diagnosis not present

## 2016-03-02 DIAGNOSIS — M1A9XX0 Chronic gout, unspecified, without tophus (tophi): Secondary | ICD-10-CM | POA: Diagnosis not present

## 2016-03-02 DIAGNOSIS — R7989 Other specified abnormal findings of blood chemistry: Secondary | ICD-10-CM

## 2016-03-02 LAB — CBC
HEMATOCRIT: 44.9 % (ref 36.0–46.0)
Hemoglobin: 14.8 g/dL (ref 12.0–15.0)
MCHC: 32.9 g/dL (ref 30.0–36.0)
MCV: 92.3 fl (ref 78.0–100.0)
Platelets: 310 10*3/uL (ref 150.0–400.0)
RBC: 4.86 Mil/uL (ref 3.87–5.11)
RDW: 13.3 % (ref 11.5–15.5)
WBC: 12.7 10*3/uL — AB (ref 4.0–10.5)

## 2016-03-02 LAB — COMPREHENSIVE METABOLIC PANEL
ALBUMIN: 4.5 g/dL (ref 3.5–5.2)
ALT: 76 U/L — AB (ref 0–35)
AST: 61 U/L — AB (ref 0–37)
Alkaline Phosphatase: 65 U/L (ref 39–117)
BILIRUBIN TOTAL: 0.6 mg/dL (ref 0.2–1.2)
BUN: 14 mg/dL (ref 6–23)
CHLORIDE: 103 meq/L (ref 96–112)
CO2: 27 mEq/L (ref 19–32)
CREATININE: 1.07 mg/dL (ref 0.40–1.20)
Calcium: 10.3 mg/dL (ref 8.4–10.5)
GFR: 53.45 mL/min — ABNORMAL LOW (ref >60.00)
Glucose, Bld: 112 mg/dL — ABNORMAL HIGH (ref 70–99)
Potassium: 4.1 mEq/L (ref 3.5–5.1)
SODIUM: 136 meq/L (ref 135–145)
TOTAL PROTEIN: 8 g/dL (ref 6.0–8.3)

## 2016-03-02 LAB — URIC ACID: Uric Acid, Serum: 8.9 mg/dL — ABNORMAL HIGH (ref 2.4–7.0)

## 2016-03-02 LAB — LIPID PANEL
CHOLESTEROL: 255 mg/dL — AB (ref 0–200)
HDL: 52.1 mg/dL (ref >39.00)
NonHDL: 202.88
Total CHOL/HDL Ratio: 5
Triglycerides: 353 mg/dL — ABNORMAL HIGH (ref 0.0–149.0)
VLDL: 70.6 mg/dL — AB (ref 0.0–40.0)

## 2016-03-02 LAB — LDL CHOLESTEROL, DIRECT: Direct LDL: 143 mg/dL

## 2016-03-02 LAB — VITAMIN D 25 HYDROXY (VIT D DEFICIENCY, FRACTURES): VITD: 32.27 ng/mL (ref 30.00–100.00)

## 2016-03-02 LAB — TSH: TSH: 1.69 u[IU]/mL (ref 0.35–4.50)

## 2016-03-02 MED ORDER — HYOSCYAMINE SULFATE 0.125 MG SL SUBL: 0.1250 mg | SUBLINGUAL_TABLET | SUBLINGUAL | 1 refills | Status: DC | PRN

## 2016-03-02 MED ORDER — COLCHICINE 0.6 MG PO TABS: 0.6000 mg | ORAL_TABLET | Freq: Every day | ORAL | 1 refills | Status: DC | PRN

## 2016-03-02 NOTE — Assessment & Plan Note (Signed)
Follows with Dr Roxy Cedar of oncology and is stable and doing well.

## 2016-03-02 NOTE — Patient Instructions (Addendum)
Benefiber twice daily, NOW probiotics, avoid artificial sweeteners. Try decaf, cold brew coffee  Gout Gout is painful swelling that can occur in some of your joints. Gout is a type of arthritis. This condition is caused by having too much uric acid in your body. Uric acid is a chemical that forms when your body breaks down substances called purines. Purines are important for building body proteins. When your body has too much uric acid, sharp crystals can form and build up inside your joints. This causes pain and swelling. Gout attacks can happen quickly and be very painful (acute gout). Over time, the attacks can affect more joints and become more frequent (chronic gout). Gout can also cause uric acid to build up under your skin and inside your kidneys. What are the causes? This condition is caused by too much uric acid in your blood. This can occur because:  Your kidneys do not remove enough uric acid from your blood. This is the most common cause.  Your body makes too much uric acid. This can occur with some cancers and cancer treatments. It can also occur if your body is breaking down too many red blood cells (hemolytic anemia).  You eat too many foods that are high in purines. These foods include organ meats and some seafood. Alcohol, especially beer, is also high in purines. A gout attack may be triggered by trauma or stress. What increases the risk? This condition is more likely to develop in people who:  Have a family history of gout.  Are female and middle-aged.  Are female and have gone through menopause.  Are obese.  Frequently drink alcohol, especially beer.  Are dehydrated.  Lose weight too quickly.  Have an organ transplant.  Have lead poisoning.  Take certain medicines, including aspirin, cyclosporine, diuretics, levodopa, and niacin.  Have kidney disease or psoriasis. What are the signs or symptoms? An attack of acute gout happens quickly. It usually occurs in just  one joint. The most common place is the big toe. Attacks often start at night. Other joints that may be affected include joints of the feet, ankle, knee, fingers, wrist, or elbow. Symptoms may include:  Severe pain.  Warmth.  Swelling.  Stiffness.  Tenderness. The affected joint may be very painful to touch.  Shiny, red, or purple skin.  Chills and fever. Chronic gout may cause symptoms more frequently. More joints may be involved. You may also have white or yellow lumps (tophi) on your hands or feet or in other areas near your joints. How is this diagnosed? This condition is diagnosed based on your symptoms, medical history, and physical exam. You may have tests, such as:  Blood tests to measure uric acid levels.  Removal of joint fluid with a needle (aspiration) to look for uric acid crystals.  X-rays to look for joint damage. How is this treated? Treatment for this condition has two phases: treating an acute attack and preventing future attacks. Acute gout treatment may include medicines to reduce pain and swelling, including:  NSAIDs.  Steroids. These are strong anti-inflammatory medicines that can be taken by mouth (orally) or injected into a joint.  Colchicine. This medicine relieves pain and swelling when it is taken soon after an attack. It can be given orally or through an IV tube. Preventive treatment may include:  Daily use of smaller doses of NSAIDs or colchicine.  Use of a medicine that reduces uric acid levels in your blood.  Changes to your diet. You may  need to see a specialist about healthy eating (dietitian). Follow these instructions at home: During a Gout Attack  If directed, apply ice to the affected area:  Put ice in a plastic bag.  Place a towel between your skin and the bag.  Leave the ice on for 20 minutes, 2-3 times a day.  Rest the joint as much as possible. If the affected joint is in your leg, you may be given crutches to use.  Raise  (elevate) the affected joint above the level of your heart as often as possible.  Drink enough fluids to keep your urine clear or pale yellow.  Take over-the-counter and prescription medicines only as told by your health care provider.  Do not drive or operate heavy machinery while taking prescription pain medicine.  Follow instructions from your health care provider about eating or drinking restrictions.  Return to your normal activities as told by your health care provider. Ask your health care provider what activities are safe for you. Avoiding Future Gout Attacks  Follow a low-purine diet as told by your dietitian or health care provider. Avoid foods and drinks that are high in purines, including liver, kidney, anchovies, asparagus, herring, mushrooms, mussels, and beer.  Limit alcohol intake to no more than 1 drink a day for nonpregnant women and 2 drinks a day for men. One drink equals 12 oz of beer, 5 oz of wine, or 1 oz of hard liquor.  Maintain a healthy weight or lose weight if you are overweight. If you want to lose weight, talk with your health care provider. It is important that you do not lose weight too quickly.  Start or maintain an exercise program as told by your health care provider.  Drink enough fluids to keep your urine clear or pale yellow.  Take over-the-counter and prescription medicines only as told by your health care provider.  Keep all follow-up visits as told by your health care provider. This is important. Contact a health care provider if:  You have another gout attack.  You continue to have symptoms of a gout attack after10 days of treatment.  You have side effects from your medicines.  You have chills or a fever.  You have burning pain when you urinate.  You have pain in your lower back or belly. Get help right away if:  You have severe or uncontrolled pain.  You cannot urinate. This information is not intended to replace advice given to  you by your health care provider. Make sure you discuss any questions you have with your health care provider. Document Released: 01/14/2000 Document Revised: 06/24/2015 Document Reviewed: 10/29/2014 Elsevier Interactive Patient Education  2017 [employer] for Gastroesophageal Reflux Disease, Adult When you have gastroesophageal reflux disease (GERD), the foods you eat and your eating habits are very important. Choosing the right foods can help ease your discomfort. What guidelines do I need to follow?  Choose fruits, vegetables, whole grains, and low-fat dairy products.  Choose low-fat meat, fish, and poultry.  Limit fats such as oils, salad dressings, butter, nuts, and avocado.  Keep a food diary. This helps you identify foods that cause symptoms.  Avoid foods that cause symptoms. These may be different for everyone.  Eat small meals often instead of 3 large meals a day.  Eat your meals slowly, in a place where you are relaxed.  Limit fried foods.  Cook foods using methods other than frying.  Avoid drinking alcohol.  Avoid drinking large  amounts of liquids with your meals.  Avoid bending over or lying down until 2-3 hours after eating. What foods are not recommended? These are some foods and drinks that may make your symptoms worse: Vegetables  Tomatoes. Tomato juice. Tomato and spaghetti sauce. Chili peppers. Onion and garlic. Horseradish. Fruits  Oranges, grapefruit, and lemon (fruit and juice). Meats  High-fat meats, fish, and poultry. This includes hot dogs, ribs, ham, sausage, salami, and bacon. Dairy  Whole milk and chocolate milk. Sour cream. Cream. Butter. Ice cream. Cream cheese. Drinks  Coffee and tea. Bubbly (carbonated) drinks or energy drinks. Condiments  Hot sauce. Barbecue sauce. Sweets/Desserts  Chocolate and cocoa. Donuts. Peppermint and spearmint. Fats and Oils  High-fat foods. This includes [place] fries and potato chips. Other    Vinegar. Strong spices. This includes black pepper, white pepper, red pepper, cayenne, curry powder, cloves, ginger, and chili powder. The items listed above may not be a complete list of foods and drinks to avoid. Contact your dietitian for more information.  This information is not intended to replace advice given to you by your health care provider. Make sure you discuss any questions you have with your health care provider. Document Released: 07/18/2011 Document Revised: 06/24/2015 Document Reviewed: 11/20/2012 Elsevier Interactive Patient Education  2017 [employer].

## 2016-03-02 NOTE — Assessment & Plan Note (Signed)
Encouraged to get adequate exercise, calcium and vitamin d intake 

## 2016-03-02 NOTE — Assessment & Plan Note (Signed)
Tolerating statin, encouraged heart healthy diet, avoid trans fats, minimize simple carbs and saturated fats. Increase exercise as tolerated 

## 2016-03-02 NOTE — Progress Notes (Signed)
Patient ID: Amanda Crawford, female   DOB: Oct 28, 1945, 71 y.o.   MRN: [id number]   Subjective:    Patient ID: Amanda Crawford, female    DOB: 1945-03-24, 71 y.o.   MRN: [id number]  Chief Complaint  Patient presents with  . Hyperlipidemia  I acted as a Education administrator for Dr. Charlett Blake. Princess, RMA   HPI  Patient is in today for 6 month follow up on hyperlipidemia, reflux, obesity and gout. She feels well today but does acknowledge she has struggled with intermittent reflux, abdominal cramping and diarrhea. No bloody or tarry stool. Denies CP/palp/SOB/HA/congestion/fevers or GU c/o. Taking meds as prescribed  Past Medical History:  Diagnosis Date  . Allergic state 03/12/2015  . Arthritis   . Colon polyp 11/19/2014  . Dermatitis 03/12/2015  . GERD (gastroesophageal reflux disease)    occ  . Gout 03/12/2015  . H/O measles   . H/O mumps   . Headache(784.0)    migraines  . History of chicken pox 11/23/2014  . Lung cancer ([place])   . Migraine 11/23/2014  . Obesity 11/23/2014  . Pneumonia    child  . Preventative health care 09/12/2015  . Renal cell cancer ([place])    renal cell ca dx 9/01 and 8/08;  . Shortness of breath    occ  . Skin lesion of breast 03/12/2015    Past Surgical History:  Procedure Laterality Date  . ABDOMINAL HYSTERECTOMY    . APPENDECTOMY    . CARDIAC CATHETERIZATION     yrs ago neg  . CHOLECYSTECTOMY    . LUNG CANCER SURGERY Left 08  . RENAL MASS EXCISION Left 01  . THORACOTOMY Left 05/09/2012   Procedure: THORACOTOMY MAJOR;  Surgeon: Gaye Pollack, MD;  Location: [facility];  Service: Thoracic;  Laterality: Left;  Marland Kitchen VIDEO BRONCHOSCOPY N/A 05/09/2012   Procedure: VIDEO BRONCHOSCOPY;  Surgeon: Gaye Pollack, MD;  Location: [facility] OR;  Service: Thoracic;  Laterality: N/A;  . WEDGE RESECTION Left 05/09/2012   Procedure: LEFT UPPER LOBE WEDGE RESECTION;  Surgeon: Gaye Pollack, MD;  Location: [facility] OR;  Service: Thoracic;  Laterality: Left;    Family History  Problem Relation Age of  Onset  . Heart failure Father   . Asthma Brother   . Asthma Daughter     Social History   Social History  . Marital status: Married    Spouse name: N/A  . Number of children: N/A  . Years of education: N/A   Occupational History  . Not on file.   Social History Main Topics  . Smoking status: Former Smoker    Packs/day: 0.50    Years: 35.00    Types: Cigarettes    Quit date: 05/08/2006  . Smokeless tobacco: Not on file  . Alcohol use No  . Drug use: No  . Sexual activity: Not on file     Comment: lives with husband, no dietary restrictions.    Other Topics Concern  . Not on file   Social History Narrative  . No narrative on file    Outpatient Medications Prior to Visit  Medication Sig Dispense Refill  . Biotin 1 MG CAPS Take by mouth.    . Calcium Carbonate (CALTRATE 600) 1500 MG TABS Take by mouth.    . cholecalciferol (VITAMIN D) 1000 UNITS tablet Take 2,000 Units by mouth daily.    . diphenhydramine-acetaminophen (TYLENOL PM) 25-500 MG TABS Take 1 tablet by mouth at bedtime as needed. For insomnia    .  famotidine (PEPCID) 20 MG tablet Take 20 mg by mouth at bedtime.    . fish oil-omega-3 fatty acids 1000 MG capsule Take 2 g by mouth daily.    Marland Kitchen glucosamine-chondroitin 500-400 MG tablet Take 1 tablet by mouth daily.    . meclizine (ANTIVERT) 12.5 MG tablet Take 1 tablet (12.5 mg total) by mouth 3 (three) times daily as needed for dizziness. As needed for   Dizziness or nausea 30 tablet 1  . omeprazole (PRILOSEC OTC) 20 MG tablet Take 20 mg by mouth daily before breakfast.    . Vitamin D, Ergocalciferol, (DRISDOL) 50000 units CAPS capsule TAKE 1 CAPSULE (50,000 UNITS TOTAL) BY MOUTH EVERY 7 (SEVEN) DAYS. TAKE FOR 12 WEEKS 4 capsule 4  . atorvastatin (LIPITOR) 10 MG tablet TAKE 1 TABLET (10 MG TOTAL) BY MOUTH EVERY OTHER DAY. 45 tablet 0  . colchicine 0.6 MG tablet Take 1 tablet (0.6 mg total) by mouth daily as needed. For gout 30 tablet 1   No facility-administered  medications prior to visit.     No Known Allergies  Review of Systems  Constitutional: Negative for fever and malaise/fatigue.  HENT: Negative for congestion.   Eyes: Negative for blurred vision.  Respiratory: Negative for shortness of breath.   Cardiovascular: Negative for chest pain, palpitations and leg swelling.  Gastrointestinal: Positive for abdominal pain, diarrhea and nausea. Negative for blood in stool, constipation, heartburn, melena and vomiting.  Genitourinary: Negative for dysuria and frequency.  Musculoskeletal: Negative for falls.  Skin: Negative for rash.  Neurological: Negative for dizziness, loss of consciousness and headaches.  Endo/Heme/Allergies: Negative for environmental allergies.  Psychiatric/Behavioral: Negative for depression. The patient is not nervous/anxious.        Objective:    Physical Exam  Constitutional: She is oriented to person, place, and time. She appears well-developed and well-nourished. No distress.  HENT:  Head: Normocephalic and atraumatic.  Nose: Nose normal.  Eyes: Right eye exhibits no discharge. Left eye exhibits no discharge.  Neck: Normal range of motion. Neck supple.  Cardiovascular: Normal rate and regular rhythm.   No murmur heard. Pulmonary/Chest: Effort normal and breath sounds normal.  Abdominal: Soft. Bowel sounds are normal. There is no tenderness.  Musculoskeletal: She exhibits no edema.  Neurological: She is alert and oriented to person, place, and time.  Skin: Skin is warm and dry.  Psychiatric: She has a normal mood and affect.  Nursing note and vitals reviewed.   BP 128/76 (BP Location: Left Arm, Patient Position: Sitting, Cuff Size: Normal)   Pulse 87   Temp 98.3 F (36.8 C) (Oral)   Wt 224 lb 3.2 oz (101.7 kg)   SpO2 95%   BMI 38.48 kg/m  Wt Readings from Last 3 Encounters:  03/02/16 224 lb 3.2 oz (101.7 kg)  11/04/15 223 lb 14.4 oz (101.6 kg)  08/31/15 225 lb 4 oz (102.2 kg)     Lab Results    Component Value Date   WBC 12.7 (H) 03/02/2016   HGB 14.8 03/02/2016   HCT 44.9 03/02/2016   PLT 310.0 03/02/2016   GLUCOSE 112 (H) 03/02/2016   CHOL 255 (H) 03/02/2016   TRIG 353.0 (H) 03/02/2016   HDL 52.10 03/02/2016   LDLDIRECT 143.0 03/02/2016   LDLCALC (H) 01/31/2007    117        Total Cholesterol/HDL:CHD Risk Coronary Heart Disease Risk Table                     Men  Women  1/2 Average Risk   3.4   3.3  Average Risk       5.0   4.4  2 X Average Risk   9.6   7.1  3 X Average Risk  23.4   11.0        Use the calculated Patient Ratio above and the CHD Risk Table to determine the patient's CHD Risk.        ATP III CLASSIFICATION (LDL):  <100     mg/dL   Optimal  100-129  mg/dL   Near or Above                    Optimal  130-159  mg/dL   Borderline  160-189  mg/dL   High  >190     mg/dL   Very High   ALT 76 (H) 03/02/2016   AST 61 (H) 03/02/2016   NA 136 03/02/2016   K 4.1 03/02/2016   CL 103 03/02/2016   CREATININE 1.07 03/02/2016   BUN 14 03/02/2016   CO2 27 03/02/2016   TSH 1.69 03/02/2016   INR 0.99 05/07/2012    Lab Results  Component Value Date   TSH 1.69 03/02/2016   Lab Results  Component Value Date   WBC 12.7 (H) 03/02/2016   HGB 14.8 03/02/2016   HCT 44.9 03/02/2016   MCV 92.3 03/02/2016   PLT 310.0 03/02/2016   Lab Results  Component Value Date   NA 136 03/02/2016   K 4.1 03/02/2016   CHLORIDE 108 11/02/2015   CO2 27 03/02/2016   GLUCOSE 112 (H) 03/02/2016   BUN 14 03/02/2016   CREATININE 1.07 03/02/2016   BILITOT 0.6 03/02/2016   ALKPHOS 65 03/02/2016   AST 61 (H) 03/02/2016   ALT 76 (H) 03/02/2016   PROT 8.0 03/02/2016   ALBUMIN 4.5 03/02/2016   CALCIUM 10.3 03/02/2016   ANIONGAP 9 11/02/2015   EGFR 48 (L) 11/02/2015   GFR 53.45 (L) 03/02/2016   Lab Results  Component Value Date   CHOL 255 (H) 03/02/2016   Lab Results  Component Value Date   HDL 52.10 03/02/2016   Lab Results  Component Value Date   LDLCALC (H)  01/31/2007    117        Total Cholesterol/HDL:CHD Risk Coronary Heart Disease Risk Table                     Men   Women  1/2 Average Risk   3.4   3.3  Average Risk       5.0   4.4  2 X Average Risk   9.6   7.1  3 X Average Risk  23.4   11.0        Use the calculated Patient Ratio above and the CHD Risk Table to determine the patient's CHD Risk.        ATP III CLASSIFICATION (LDL):  <100     mg/dL   Optimal  100-129  mg/dL   Near or Above                    Optimal  130-159  mg/dL   Borderline  160-189  mg/dL   High  >190     mg/dL   Very High   Lab Results  Component Value Date   TRIG 353.0 (H) 03/02/2016   Lab Results  Component Value Date   CHOLHDL 5 03/02/2016   No results  found for: HGBA1C     Assessment & Plan:   Problem List Items Addressed This Visit    Cancer of kidney [facility])    Follows with Dr Roxy Cedar of oncology and is stable and doing well.       Relevant Medications   colchicine 0.6 MG tablet   GERD (gastroesophageal reflux disease)    Avoid offending foods, start probiotics. Do not eat large meals in late evening and consider raising head of bed. Avoid artificial sweeteners and increase water. Add Benefiber bid. Report worsening symptoms      Relevant Medications   hyoscyamine (LEVSIN SL) 0.125 MG SL tablet   Other Relevant Orders   Comprehensive metabolic panel (Completed)   CBC (Completed)   TSH (Completed)   Hyperlipidemia    Tolerating statin, encouraged heart healthy diet, avoid trans fats, minimize simple carbs and saturated fats. Increase exercise as tolerated      Relevant Orders   Lipid panel (Completed)   Osteopenia    Encouraged to get adequate exercise, calcium and vitamin d intake      Low serum vitamin D    wnl continue supplementation      Migraine   Relevant Medications   colchicine 0.6 MG tablet   Morbid obesity ([place])    Encouraged DASH diet, decrease po intake and increase exercise as tolerated. Needs 7-8 hours of  sleep nightly. Avoid trans fats, eat small, frequent meals every 4-5 hours with lean proteins, complex carbs and healthy fats. Minimize simple carbs, given contact info for bariatric service      Gout    Flare in left foot today midfoot. Painful starting today. Struggles with dehydration, 64 oz is encouraged. Is encouraged to avoid artifical sweeteners       Relevant Orders   VITAMIN D 25 Hydroxy (Vit-D Deficiency, Fractures) (Completed)   Uric acid (Completed)    Other Visit Diagnoses    Abdominal pain, unspecified abdominal location    -  Primary      I am having Ms. Guttierrez start on hyoscyamine. I am also having her maintain her fish oil-omega-3 fatty acids, diphenhydramine-acetaminophen, glucosamine-chondroitin, Biotin, calcium carbonate, meclizine, cholecalciferol, omeprazole, famotidine, Vitamin D (Ergocalciferol), and colchicine.  Meds ordered this encounter  Medications  . colchicine 0.6 MG tablet    Sig: Take 1 tablet (0.6 mg total) by mouth daily as needed. For gout    Dispense:  30 tablet    Refill:  1  . hyoscyamine (LEVSIN SL) 0.125 MG SL tablet    Sig: Place 1 tablet (0.125 mg total) under the tongue every 4 (four) hours as needed.    Dispense:  30 tablet    Refill:  1    CMA served as scribe during this visit. History, Physical and Plan performed by medical provider. Documentation and orders reviewed and attested to.  Penni Homans, MD

## 2016-03-02 NOTE — Progress Notes (Signed)
Pre visit review using our clinic review tool, if applicable. No additional management support is needed unless otherwise documented below in the visit note. 

## 2016-03-02 NOTE — Assessment & Plan Note (Addendum)
Flare in left foot today midfoot. Painful starting today. Struggles with dehydration, 64 oz is encouraged. Is encouraged to avoid artifical sweeteners

## 2016-03-02 NOTE — Assessment & Plan Note (Addendum)
Avoid offending foods, start probiotics. Do not eat large meals in late evening and consider raising head of bed. Avoid artificial sweeteners and increase water. Add Benefiber bid. Report worsening symptoms

## 2016-03-03 ENCOUNTER — Other Ambulatory Visit: Payer: Self-pay | Admitting: Family Medicine

## 2016-03-03 DIAGNOSIS — D72829 Elevated white blood cell count, unspecified: Secondary | ICD-10-CM

## 2016-03-03 MED ORDER — CIPROFLOXACIN HCL 250 MG PO TABS: 250.0000 mg | ORAL_TABLET | Freq: Two times a day (BID) | ORAL | 0 refills | Status: DC

## 2016-03-03 MED ORDER — METRONIDAZOLE 500 MG PO TABS: 500.0000 mg | ORAL_TABLET | Freq: Three times a day (TID) | ORAL | 0 refills | Status: DC

## 2016-03-05 NOTE — Assessment & Plan Note (Signed)
Encouraged DASH diet, decrease po intake and increase exercise as tolerated. Needs 7-8 hours of sleep nightly. Avoid trans fats, eat small, frequent meals every 4-5 hours with lean proteins, complex carbs and healthy fats. Minimize simple carbs, given contact info for bariatric service

## 2016-03-05 NOTE — Assessment & Plan Note (Signed)
wnl continue supplementation

## 2016-03-06 ENCOUNTER — Telehealth: Payer: Self-pay | Admitting: Family Medicine

## 2016-03-06 NOTE — Telephone Encounter (Signed)
Colchicine is the only gout medicine I know of as well. I ran an interaction check with Levsin and colchicine and got zero interactions as well so I guess I would have her have the pharmacist fax [place] over any interaction concerns. Would not stop one to use the other

## 2016-03-06 NOTE — Telephone Encounter (Signed)
Relation to PN:PYYF Call back number:[phone] Pharmacy: [id number] [phone](Phone) [phone](Fax)     Reason for call:  Patient states as per pharmacy hyoscyamine (LEVSIN SL) 0.125 MG SL tablet and gout medication drug interaction, please advise

## 2016-03-06 NOTE — Telephone Encounter (Signed)
Please advise. I do not see any drug-drug interactions between colchicine and levsin on Micromedex, and do not see any other gout med besides colchicine.

## 2016-03-07 MED ORDER — ALLOPURINOL 100 MG PO TABS: 100.0000 mg | ORAL_TABLET | Freq: Every day | ORAL | 3 refills | Status: DC

## 2016-03-07 NOTE — Telephone Encounter (Signed)
Cannot control the cost of these things anymore. Can switch to Allopurinol 100 mg but it is a daily med to keep uric acid down disp #30 with 3 rf if she wants to try it

## 2016-03-07 NOTE — Telephone Encounter (Signed)
Called patient, she agrees to try allopurinol. She is quite uncomfortable given that current gout flare has gone untreated since appt since she did not pick up the colchicine. Medication filled to pharmacy as requested.

## 2016-03-07 NOTE — Telephone Encounter (Signed)
Spoke w/ pharmacy, there is no drug-drug interaction noted. Pharmacist stated that colchicine is $160 and patient did not want to pay that much, so she requested alternative medication. Please advise.

## 2016-03-07 NOTE — Telephone Encounter (Signed)
Patient called to follow up on this situation. She would like a call back. Please advise  Phone: [phone]

## 2016-03-09 ENCOUNTER — Ambulatory Visit (HOSPITAL_BASED_OUTPATIENT_CLINIC_OR_DEPARTMENT_OTHER)
Admission: RE | Admit: 2016-03-09 | Discharge: 2016-03-09 | Disposition: A | Discharge status: Home / Self Care | Payer: Medicare HMO | Source: Ambulatory Visit | Attending: Family Medicine | Admitting: Family Medicine

## 2016-03-09 DIAGNOSIS — Z1231 Encounter for screening mammogram for malignant neoplasm of breast: Secondary | ICD-10-CM | POA: Diagnosis not present

## 2016-03-17 ENCOUNTER — Other Ambulatory Visit (INDEPENDENT_AMBULATORY_CARE_PROVIDER_SITE_OTHER): Payer: Medicare HMO

## 2016-03-17 DIAGNOSIS — D72829 Elevated white blood cell count, unspecified: Secondary | ICD-10-CM

## 2016-03-17 LAB — CBC
HEMATOCRIT: 42.6 % (ref 36.0–46.0)
HEMOGLOBIN: 14.1 g/dL (ref 12.0–15.0)
MCHC: 33.2 g/dL (ref 30.0–36.0)
MCV: 92.5 fl (ref 78.0–100.0)
Platelets: 359 10*3/uL (ref 150.0–400.0)
RBC: 4.6 Mil/uL (ref 3.87–5.11)
RDW: 13.4 % (ref 11.5–15.5)
WBC: 9.3 10*3/uL (ref 4.0–10.5)

## 2016-03-17 LAB — COMPREHENSIVE METABOLIC PANEL
ALT: 47 U/L — ABNORMAL HIGH (ref 0–35)
AST: 59 U/L — ABNORMAL HIGH (ref 0–37)
Albumin: 4 g/dL (ref 3.5–5.2)
Alkaline Phosphatase: 55 U/L (ref 39–117)
BUN: 18 mg/dL (ref 6–23)
CHLORIDE: 103 meq/L (ref 96–112)
CO2: 24 meq/L (ref 19–32)
Calcium: 10 mg/dL (ref 8.4–10.5)
Creatinine, Ser: 1.06 mg/dL (ref 0.40–1.20)
GFR: 54.03 mL/min — ABNORMAL LOW (ref >60.00)
GLUCOSE: 189 mg/dL — AB (ref 70–99)
POTASSIUM: 4.1 meq/L (ref 3.5–5.1)
SODIUM: 136 meq/L (ref 135–145)
TOTAL PROTEIN: 7.3 g/dL (ref 6.0–8.3)
Total Bilirubin: 0.5 mg/dL (ref 0.2–1.2)

## 2016-04-18 DIAGNOSIS — H25013 Cortical age-related cataract, bilateral: Secondary | ICD-10-CM | POA: Diagnosis not present

## 2016-04-18 DIAGNOSIS — H40013 Open angle with borderline findings, low risk, bilateral: Secondary | ICD-10-CM | POA: Diagnosis not present

## 2016-04-18 DIAGNOSIS — H1852 Epithelial (juvenile) corneal dystrophy: Secondary | ICD-10-CM | POA: Diagnosis not present

## 2016-04-18 DIAGNOSIS — H2513 Age-related nuclear cataract, bilateral: Secondary | ICD-10-CM | POA: Diagnosis not present

## 2016-04-18 LAB — HM DIABETES EYE EXAM

## 2016-04-21 ENCOUNTER — Encounter: Payer: Self-pay | Admitting: Family Medicine

## 2016-05-04 ENCOUNTER — Ambulatory Visit (HOSPITAL_BASED_OUTPATIENT_CLINIC_OR_DEPARTMENT_OTHER): Payer: Medicare HMO | Admitting: Oncology

## 2016-05-04 ENCOUNTER — Other Ambulatory Visit (HOSPITAL_BASED_OUTPATIENT_CLINIC_OR_DEPARTMENT_OTHER): Payer: Medicare HMO

## 2016-05-04 ENCOUNTER — Telehealth: Payer: Self-pay | Admitting: Oncology

## 2016-05-04 VITALS — BP 153/69 | HR 89 | Temp 98.1°F | Resp 17 | Ht 64.0 in | Wt 228.7 lb

## 2016-05-04 DIAGNOSIS — Z85528 Personal history of other malignant neoplasm of kidney: Secondary | ICD-10-CM | POA: Diagnosis not present

## 2016-05-04 DIAGNOSIS — C649 Malignant neoplasm of unspecified kidney, except renal pelvis: Secondary | ICD-10-CM

## 2016-05-04 LAB — CBC WITH DIFFERENTIAL/PLATELET
BASO%: 0.4 % (ref 0.0–2.0)
Basophils Absolute: 0 10*3/uL (ref 0.0–0.1)
EOS ABS: 0.2 10*3/uL (ref 0.0–0.5)
EOS%: 2.2 % (ref 0.0–7.0)
HCT: 43.6 % (ref 34.8–46.6)
HGB: 14.1 g/dL (ref 11.6–15.9)
LYMPH%: 27 % (ref 14.0–49.7)
MCH: 30.9 pg (ref 25.1–34.0)
MCHC: 32.3 g/dL (ref 31.5–36.0)
MCV: 95.4 fL (ref 79.5–101.0)
MONO#: 0.6 10*3/uL (ref 0.1–0.9)
MONO%: 7.3 % (ref 0.0–14.0)
NEUT%: 63.1 % (ref 38.4–76.8)
NEUTROS ABS: 5.4 10*3/uL (ref 1.5–6.5)
Platelets: 273 10*3/uL (ref 145–400)
RBC: 4.57 10*6/uL (ref 3.70–5.45)
RDW: 14.1 % (ref 11.2–14.5)
WBC: 8.5 10*3/uL (ref 3.9–10.3)
lymph#: 2.3 10*3/uL (ref 0.9–3.3)

## 2016-05-04 LAB — COMPREHENSIVE METABOLIC PANEL
ALT: 80 U/L — ABNORMAL HIGH (ref 0–55)
AST: 80 U/L — ABNORMAL HIGH (ref 5–34)
Albumin: 3.7 g/dL (ref 3.5–5.0)
Alkaline Phosphatase: 77 U/L (ref 40–150)
Anion Gap: 12 mEq/L — ABNORMAL HIGH (ref 3–11)
BILIRUBIN TOTAL: 0.64 mg/dL (ref 0.20–1.20)
BUN: 12.4 mg/dL (ref 7.0–26.0)
CHLORIDE: 107 meq/L (ref 98–109)
CO2: 23 meq/L (ref 22–29)
Calcium: 9.9 mg/dL (ref 8.4–10.4)
Creatinine: 1 mg/dL (ref 0.6–1.1)
EGFR: 59 mL/min/{1.73_m2} — AB (ref >90)
Glucose: 153 mg/dl — ABNORMAL HIGH (ref 70–140)
Potassium: 4 mEq/L (ref 3.5–5.1)
Sodium: 141 mEq/L (ref 136–145)
TOTAL PROTEIN: 7.5 g/dL (ref 6.4–8.3)

## 2016-05-04 NOTE — Progress Notes (Signed)
Hematology and Oncology Follow Up Visit  Amanda Crawford [id number] Mar 10, 1945 71 y.o. 05/04/2016 9:24 AM  CC: Amanda Crawford, M.D.  Amanda Crawford, M.D.    Principle Diagnosis: 71 year old female diagnosed with renal cell carcinoma, diagnosed in 2001, had a T2 disease and had recurrent disease in a lung nodule in 2008.  Prior Therapy:  She is status post left nephrectomy in 2001.  She developed a left lingular mass, status post VATS procedure.  Pathology revealed a recurrent renal cell carcinoma resected in October 2008.   She developed relapse disease in 2014 and subsequently underwent a VATS and left upper lobe wedge resection of a tumor done on 05/09/2012. Pathology confirmed another relapse.  Current therapy: Observation and surveillance.  Interim History: Amanda Crawford presents today for a followup visit by her self. Since the last visit, she reports doing well without any changes. She denied any hemoptysis or chest pain. She does report some mild dyspnea on exertion associated with her obesity. She continues to be active and performs activities of daily living without any decline. She does report a pain around her lung surgery site but have not changed dramatically. She denied any hematuria or dysuria. She denied any appetite changes or weight loss. She remains active and planning to work in her garden at this time.  She does not report any headaches, blurry vision, syncope or seizures. She does not report any fevers, chills or sweats. She has not reported any GI symptoms including abdominal pain or diarrhea. She has not reported any fever or hematochezia. She does not report any frequency urgency or hesitancy. She does not report any skeletal complaints. Does not report any lymphadenopathy or petechiae. Rest of the review of systems unremarkable.   Medications: I have reviewed the patient's current medications. Current Outpatient Prescriptions  Medication Sig Dispense Refill  .  allopurinol (ZYLOPRIM) 100 MG tablet Take 1 tablet (100 mg total) by mouth daily. 30 tablet 3  . atorvastatin (LIPITOR) 10 MG tablet TAKE 1 TABLET (10 MG TOTAL) BY MOUTH EVERY OTHER DAY. 45 tablet 0  . Biotin 1 MG CAPS Take by mouth.    . Calcium Carbonate (CALTRATE 600) 1500 MG TABS Take by mouth.    . cholecalciferol (VITAMIN D) 1000 UNITS tablet Take 2,000 Units by mouth daily.    . ciprofloxacin (CIPRO) 250 MG tablet Take 1 tablet (250 mg total) by mouth 2 (two) times daily. Take for 10 days. 20 tablet 0  . diphenhydramine-acetaminophen (TYLENOL PM) 25-500 MG TABS Take 1 tablet by mouth at bedtime as needed. For insomnia    . famotidine (PEPCID) 20 MG tablet Take 20 mg by mouth at bedtime.    . fish oil-omega-3 fatty acids 1000 MG capsule Take 2 g by mouth daily.    Marland Kitchen glucosamine-chondroitin 500-400 MG tablet Take 1 tablet by mouth daily.    . hyoscyamine (LEVSIN SL) 0.125 MG SL tablet Place 1 tablet (0.125 mg total) under the tongue every 4 (four) hours as needed. 30 tablet 1  . meclizine (ANTIVERT) 12.5 MG tablet Take 1 tablet (12.5 mg total) by mouth 3 (three) times daily as needed for dizziness. As needed for   Dizziness or nausea 30 tablet 1  . metroNIDAZOLE (FLAGYL) 500 MG tablet Take 1 tablet (500 mg total) by mouth 3 (three) times daily. Take for 10 days. 30 tablet 0  . omeprazole (PRILOSEC OTC) 20 MG tablet Take 20 mg by mouth daily before breakfast.    . Vitamin  D, Ergocalciferol, (DRISDOL) 50000 units CAPS capsule TAKE 1 CAPSULE (50,000 UNITS TOTAL) BY MOUTH EVERY 7 (SEVEN) DAYS. TAKE FOR 12 WEEKS 4 capsule 4   No current facility-administered medications for this visit.      Allergies: No Known Allergies  Past Medical History, Surgical history, Social history, and Family History were reviewed and updated.   Physical Exam: Blood pressure (!) 153/69, pulse 89, temperature 98.1 F (36.7 C), temperature source Oral, resp. rate 17, height '5\' 4"'$  (1.626 m), weight 228 lb 11.2 oz  (103.7 kg), SpO2 96 %. ECOG: 1 General appearance: Alert, awake woman without distress. Head: Normocephalic, without obvious abnormality no oral ulcers or thrush. Neck: no adenopathy Lymph nodes: Cervical, supraclavicular, and axillary nodes normal. Heart:regular rate and rhythm, S1, S2 normal, no murmur, click, rub or gallop Lung:chest clear, no wheezing, rales, normal symmetric air entry.  Abdomin: soft, non-tender, without masses or organomegaly no rebound or guarding. EXT:no erythema, induration, or nodules Skin: No rashes or lesions.  Lab Results: Lab Results  Component Value Date   WBC 8.5 05/04/2016   HGB 14.1 05/04/2016   HCT 43.6 05/04/2016   MCV 95.4 05/04/2016   PLT 273 05/04/2016     Chemistry      Component Value Date/Time   NA 136 03/17/2016 1024   NA 141 11/02/2015 0849   K 4.1 03/17/2016 1024   K 4.8 11/02/2015 0849   CL 103 03/17/2016 1024   CL 107 04/16/2012 0923   CO2 24 03/17/2016 1024   CO2 24 11/02/2015 0849   BUN 18 03/17/2016 1024   BUN 23.7 11/02/2015 0849   CREATININE 1.06 03/17/2016 1024   CREATININE 1.1 11/02/2015 0849      Component Value Date/Time   CALCIUM 10.0 03/17/2016 1024   CALCIUM 10.2 11/02/2015 0849   ALKPHOS 55 03/17/2016 1024   ALKPHOS 76 11/02/2015 0849   AST 59 (H) 03/17/2016 1024   AST 45 (H) 11/02/2015 0849   ALT 47 (H) 03/17/2016 1024   ALT 61 (H) 11/02/2015 0849   BILITOT 0.5 03/17/2016 1024   BILITOT 0.54 11/02/2015 0849        Impression and Plan:   71 year old female with the following issues: 1. Renal cell carcinoma.  She has stage IV NED disease with lung nodules removed on 2 separate occasions the last of which removed in 04/2012. CT scan from 11/02/2015 did not show any evidence of relapsed disease. The plan is to continue with active surveillance and repeat imaging studies in October 2018. Systemic therapy can be used if she has widespread disease, otherwise she'll be treated with local therapy for a local  recurrence she develops. 2.     Diverticulosis: She has no evidence of diverticulitis and she had a colonoscopy recently without any complications. 3.     Pain around the thoracotomy site: Not dramatically changed. 4.     Dyspnea on exertion: Likely related to her weight and deconditioning. Unchanged from previous examinations. 5.     Follow up: in 6 months for clinical visit and a repeat CT scan.   [facility], MD 4/5/20189:24 AM

## 2016-05-04 NOTE — Addendum Note (Signed)
Addended by: Amelia Jo I on: 05/04/2016 09:36 AM   Modules accepted: Orders

## 2016-05-04 NOTE — Telephone Encounter (Signed)
Gave patient AVS and calender per 05/04/2016 los - [facility] to contact patient with CT appts.

## 2016-06-20 ENCOUNTER — Other Ambulatory Visit: Payer: Self-pay | Admitting: Family Medicine

## 2016-06-29 ENCOUNTER — Ambulatory Visit (INDEPENDENT_AMBULATORY_CARE_PROVIDER_SITE_OTHER): Payer: Medicare HMO | Admitting: Family Medicine

## 2016-06-29 ENCOUNTER — Encounter: Payer: Self-pay | Admitting: Family Medicine

## 2016-06-29 VITALS — BP 140/78 | HR 79 | Temp 98.3°F | Resp 18 | Wt 228.0 lb

## 2016-06-29 DIAGNOSIS — R197 Diarrhea, unspecified: Secondary | ICD-10-CM

## 2016-06-29 DIAGNOSIS — M8580 Other specified disorders of bone density and structure, unspecified site: Secondary | ICD-10-CM

## 2016-06-29 DIAGNOSIS — E119 Type 2 diabetes mellitus without complications: Secondary | ICD-10-CM | POA: Insufficient documentation

## 2016-06-29 DIAGNOSIS — Z85118 Personal history of other malignant neoplasm of bronchus and lung: Secondary | ICD-10-CM

## 2016-06-29 DIAGNOSIS — R739 Hyperglycemia, unspecified: Secondary | ICD-10-CM

## 2016-06-29 DIAGNOSIS — R7989 Other specified abnormal findings of blood chemistry: Secondary | ICD-10-CM

## 2016-06-29 DIAGNOSIS — R0602 Shortness of breath: Secondary | ICD-10-CM | POA: Diagnosis not present

## 2016-06-29 DIAGNOSIS — E782 Mixed hyperlipidemia: Secondary | ICD-10-CM | POA: Diagnosis not present

## 2016-06-29 DIAGNOSIS — M1A9XX0 Chronic gout, unspecified, without tophus (tophi): Secondary | ICD-10-CM

## 2016-06-29 HISTORY — DX: Hyperglycemia, unspecified: R73.9

## 2016-06-29 HISTORY — DX: Type 2 diabetes mellitus without complications: E11.9

## 2016-06-29 HISTORY — DX: Diarrhea, unspecified: R19.7

## 2016-06-29 LAB — COMPREHENSIVE METABOLIC PANEL
ALT: 64 U/L — AB (ref 0–35)
AST: 70 U/L — AB (ref 0–37)
Albumin: 4.4 g/dL (ref 3.5–5.2)
Alkaline Phosphatase: 65 U/L (ref 39–117)
BUN: 18 mg/dL (ref 6–23)
CALCIUM: 10.1 mg/dL (ref 8.4–10.5)
CHLORIDE: 102 meq/L (ref 96–112)
CO2: 30 meq/L (ref 19–32)
CREATININE: 0.95 mg/dL (ref 0.40–1.20)
GFR: 61.26 mL/min (ref >60.00)
GLUCOSE: 121 mg/dL — AB (ref 70–99)
Potassium: 4.1 mEq/L (ref 3.5–5.1)
Sodium: 138 mEq/L (ref 135–145)
Total Bilirubin: 0.7 mg/dL (ref 0.2–1.2)
Total Protein: 7.7 g/dL (ref 6.0–8.3)

## 2016-06-29 LAB — VITAMIN D 25 HYDROXY (VIT D DEFICIENCY, FRACTURES): VITD: 30.41 ng/mL (ref 30.00–100.00)

## 2016-06-29 LAB — HEMOGLOBIN A1C: HEMOGLOBIN A1C: 7.1 % — AB (ref 4.6–6.5)

## 2016-06-29 LAB — URIC ACID: URIC ACID, SERUM: 7 mg/dL (ref 2.4–7.0)

## 2016-06-29 NOTE — Assessment & Plan Note (Signed)
Check vitamin D level, only taking the once weekly dose

## 2016-06-29 NOTE — Assessment & Plan Note (Signed)
Increase benefiber to twice daily and use Imodium prn. Avoid offending foods

## 2016-06-29 NOTE — Patient Instructions (Addendum)
Hyoscyamine is for abdominal cramping Add a fiber supplement such as Benefiber twice daily and/or Imodium can be used before going out   Call [facility] and confirm you need a colonoscopy repeat this year and remind Dr Charlett Blake gets a copy of the plan Gout Gout is painful swelling that can occur in some of your joints. Gout is a type of arthritis. This condition is caused by having too much uric acid in your body. Uric acid is a chemical that forms when your body breaks down substances called purines. Purines are important for building body proteins. When your body has too much uric acid, sharp crystals can form and build up inside your joints. This causes pain and swelling. Gout attacks can happen quickly and be very painful (acute gout). Over time, the attacks can affect more joints and become more frequent (chronic gout). Gout can also cause uric acid to build up under your skin and inside your kidneys. What are the causes? This condition is caused by too much uric acid in your blood. This can occur because:  Your kidneys do not remove enough uric acid from your blood. This is the most common cause.  Your body makes too much uric acid. This can occur with some cancers and cancer treatments. It can also occur if your body is breaking down too many red blood cells (hemolytic anemia).  You eat too many foods that are high in purines. These foods include organ meats and some seafood. Alcohol, especially beer, is also high in purines.  A gout attack may be triggered by trauma or stress. What increases the risk? This condition is more likely to develop in people who:  Have a family history of gout.  Are female and middle-aged.  Are female and have gone through menopause.  Are obese.  Frequently drink alcohol, especially beer.  Are dehydrated.  Lose weight too quickly.  Have an organ transplant.  Have lead poisoning.  Take certain medicines, including aspirin, cyclosporine,  diuretics, levodopa, and niacin.  Have kidney disease or psoriasis.  What are the signs or symptoms? An attack of acute gout happens quickly. It usually occurs in just one joint. The most common place is the big toe. Attacks often start at night. Other joints that may be affected include joints of the feet, ankle, knee, fingers, wrist, or elbow. Symptoms may include:  Severe pain.  Warmth.  Swelling.  Stiffness.  Tenderness. The affected joint may be very painful to touch.  Shiny, red, or purple skin.  Chills and fever.  Chronic gout may cause symptoms more frequently. More joints may be involved. You may also have white or yellow lumps (tophi) on your hands or feet or in other areas near your joints. How is this diagnosed? This condition is diagnosed based on your symptoms, medical history, and physical exam. You may have tests, such as:  Blood tests to measure uric acid levels.  Removal of joint fluid with a needle (aspiration) to look for uric acid crystals.  X-rays to look for joint damage.  How is this treated? Treatment for this condition has two phases: treating an acute attack and preventing future attacks. Acute gout treatment may include medicines to reduce pain and swelling, including:  NSAIDs.  Steroids. These are strong anti-inflammatory medicines that can be taken by mouth (orally) or injected into a joint.  Colchicine. This medicine relieves pain and swelling when it is taken soon after an attack. It can be given orally or through an  IV tube.  Preventive treatment may include:  Daily use of smaller doses of NSAIDs or colchicine.  Use of a medicine that reduces uric acid levels in your blood.  Changes to your diet. You may need to see a specialist about healthy eating (dietitian).  Follow these instructions at home: During a Gout Attack  If directed, apply ice to the affected area: ? Put ice in a plastic bag. ? Place a towel between your skin and the  bag. ? Leave the ice on for 20 minutes, 2-3 times a day.  Rest the joint as much as possible. If the affected joint is in your leg, you may be given crutches to use.  Raise (elevate) the affected joint above the level of your heart as often as possible.  Drink enough fluids to keep your urine clear or pale yellow.  Take over-the-counter and prescription medicines only as told by your health care provider.  Do not drive or operate heavy machinery while taking prescription pain medicine.  Follow instructions from your health care provider about eating or drinking restrictions.  Return to your normal activities as told by your health care provider. Ask your health care provider what activities are safe for you. Avoiding Future Gout Attacks  Follow a low-purine diet as told by your dietitian or health care provider. Avoid foods and drinks that are high in purines, including liver, kidney, anchovies, asparagus, herring, mushrooms, mussels, and beer.  Limit alcohol intake to no more than 1 drink a day for nonpregnant women and 2 drinks a day for men. One drink equals 12 oz of beer, 5 oz of wine, or 1 oz of hard liquor.  Maintain a healthy weight or lose weight if you are overweight. If you want to lose weight, talk with your health care provider. It is important that you do not lose weight too quickly.  Start or maintain an exercise program as told by your health care provider.  Drink enough fluids to keep your urine clear or pale yellow.  Take over-the-counter and prescription medicines only as told by your health care provider.  Keep all follow-up visits as told by your health care provider. This is important. Contact a health care provider if:  You have another gout attack.  You continue to have symptoms of a gout attack after10 days of treatment.  You have side effects from your medicines.  You have chills or a fever.  You have burning pain when you urinate.  You have pain  in your lower back or belly. Get help right away if:  You have severe or uncontrolled pain.  You cannot urinate. This information is not intended to replace advice given to you by your health care provider. Make sure you discuss any questions you have with your health care provider. Document Released: 01/14/2000 Document Revised: 06/24/2015 Document Reviewed: 10/29/2014 Elsevier Interactive Patient Education  2017 [employer].

## 2016-06-29 NOTE — Assessment & Plan Note (Signed)
Dr Barbaraann Faster with oncology sees her annually

## 2016-06-29 NOTE — Progress Notes (Signed)
Subjective:  I acted as a Education administrator for Dr. Charlett Blake. [place], [place]  Patient ID: Amanda Crawford, female    DOB: Feb 27, 1945, 71 y.o.   MRN: [id number]  Chief Complaint  Patient presents with  . Follow-up    HPI  Patient is in today for a 4 month follow up. She is struggling with pain in her joints especially her feet. She was in severe pain but it has improved now.. No warmth or redness. No recent febrile illness or hospitalizations. Denies CP/palp/SOB/HA/congestion/fevers/GI or GU c/o. Taking meds as prescribed. No new concerns. Has not been very active due to her pain.   Patient Care Team: [name], MD as PCP - General (Family Medicine) Wyatt Portela, MD (Hematology and Oncology) Gaye Pollack, MD as Attending Physician (Cardiothoracic Surgery)   Past Medical History:  Diagnosis Date  . Allergic state 03/12/2015  . Arthritis   . Colon polyp 11/19/2014  . Dermatitis 03/12/2015  . Diarrhea 06/29/2016  . GERD (gastroesophageal reflux disease)    occ  . Gout 03/12/2015  . H/O measles   . H/O mumps   . Headache(784.0)    migraines  . History of chicken pox 11/23/2014  . Hyperglycemia 06/29/2016  . Lung cancer ([place])   . Migraine 11/23/2014  . Obesity 11/23/2014  . Pneumonia    child  . Preventative health care 09/12/2015  . Renal cell cancer ([place])    renal cell ca dx 9/01 and 8/08;  . Shortness of breath    occ  . Skin lesion of breast 03/12/2015    Past Surgical History:  Procedure Laterality Date  . ABDOMINAL HYSTERECTOMY    . APPENDECTOMY    . CARDIAC CATHETERIZATION     yrs ago neg  . CHOLECYSTECTOMY    . LUNG CANCER SURGERY Left 08  . RENAL MASS EXCISION Left 01  . THORACOTOMY Left 05/09/2012   Procedure: THORACOTOMY MAJOR;  Surgeon: Gaye Pollack, MD;  Location: [facility];  Service: Thoracic;  Laterality: Left;  Marland Kitchen VIDEO BRONCHOSCOPY N/A 05/09/2012   Procedure: VIDEO BRONCHOSCOPY;  Surgeon: Gaye Pollack, MD;  Location: [facility] OR;  Service: Thoracic;  Laterality:  N/A;  . WEDGE RESECTION Left 05/09/2012   Procedure: LEFT UPPER LOBE WEDGE RESECTION;  Surgeon: Gaye Pollack, MD;  Location: [facility] OR;  Service: Thoracic;  Laterality: Left;    Family History  Problem Relation Age of Onset  . Heart failure Father   . Asthma Brother   . Asthma Daughter     Social History   Social History  . Marital status: Married    Spouse name: N/A  . Number of children: N/A  . Years of education: N/A   Occupational History  . Not on file.   Social History Main Topics  . Smoking status: Former Smoker    Packs/day: 0.50    Years: 35.00    Types: Cigarettes    Quit date: 05/08/2006  . Smokeless tobacco: Not on file  . Alcohol use No  . Drug use: No  . Sexual activity: Not on file     Comment: lives with husband, no dietary restrictions.    Other Topics Concern  . Not on file   Social History Narrative  . No narrative on file    Outpatient Medications Prior to Visit  Medication Sig Dispense Refill  . allopurinol (ZYLOPRIM) 100 MG tablet Take 1 tablet (100 mg total) by mouth daily. 30 tablet 3  . atorvastatin (LIPITOR) 10  MG tablet TAKE 1 TABLET (10 MG TOTAL) BY MOUTH EVERY OTHER DAY. 45 tablet 0  . Biotin 1 MG CAPS Take by mouth.    . Calcium Carbonate (CALTRATE 600) 1500 MG TABS Take by mouth.    . diphenhydramine-acetaminophen (TYLENOL PM) 25-500 MG TABS Take 1 tablet by mouth at bedtime as needed. For insomnia    . famotidine (PEPCID) 20 MG tablet Take 20 mg by mouth at bedtime.    . fish oil-omega-3 fatty acids 1000 MG capsule Take 2 g by mouth daily.    Marland Kitchen glucosamine-chondroitin 500-400 MG tablet Take 1 tablet by mouth daily.    . hyoscyamine (LEVSIN SL) 0.125 MG SL tablet Place 1 tablet (0.125 mg total) under the tongue every 4 (four) hours as needed. 30 tablet 1  . meclizine (ANTIVERT) 12.5 MG tablet Take 1 tablet (12.5 mg total) by mouth 3 (three) times daily as needed for dizziness. As needed for   Dizziness or nausea 30 tablet 1  . omeprazole  (PRILOSEC OTC) 20 MG tablet Take 20 mg by mouth daily before breakfast.    . Vitamin D, Ergocalciferol, (DRISDOL) 50000 units CAPS capsule TAKE 1 CAPSULE (50,000 UNITS TOTAL) BY MOUTH EVERY 7 (SEVEN) DAYS. TAKE FOR 12 WEEKS 4 capsule 4   No facility-administered medications prior to visit.     No Known Allergies  Review of Systems  Constitutional: Negative for fever and malaise/fatigue.  HENT: Negative for congestion.   Eyes: Negative for blurred vision.  Respiratory: Negative for cough and shortness of breath.   Cardiovascular: Negative for chest pain, palpitations and leg swelling.  Gastrointestinal: Negative for vomiting.  Musculoskeletal: Positive for joint pain. Negative for back pain.  Skin: Negative for rash.  Neurological: Negative for loss of consciousness and headaches.       Objective:    Physical Exam  Constitutional: She is oriented to person, place, and time. She appears well-developed and well-nourished. No distress.  HENT:  Head: Normocephalic and atraumatic.  Eyes: Conjunctivae are normal.  Neck: Normal range of motion. No thyromegaly present.  Cardiovascular: Normal rate and regular rhythm.   Pulmonary/Chest: Effort normal and breath sounds normal. She has no wheezes.  Abdominal: Soft. Bowel sounds are normal. There is no tenderness.  Musculoskeletal: Normal range of motion. She exhibits no edema or deformity.  Neurological: She is alert and oriented to person, place, and time.  Skin: Skin is warm and dry. She is not diaphoretic.  Psychiatric: She has a normal mood and affect.    BP 140/78 (BP Location: Left Arm, Patient Position: Sitting, Cuff Size: Normal)   Pulse 79   Temp 98.3 F (36.8 C) (Oral)   Resp 18   Wt 228 lb (103.4 kg)   SpO2 96%   BMI 39.14 kg/m  Wt Readings from Last 3 Encounters:  06/29/16 228 lb (103.4 kg)  05/04/16 228 lb 11.2 oz (103.7 kg)  03/02/16 224 lb 3.2 oz (101.7 kg)   BP Readings from Last 3 Encounters:  06/29/16 140/78   05/04/16 (!) 153/69  03/02/16 128/76     Immunization History  Administered Date(s) Administered  . Influenza-Unspecified 11/21/2014  . Pneumococcal Conjugate-13 03/12/2015  . Td 08/31/2015    Health Maintenance  Topic Date Due  . PNA vac Low Risk Adult (2 of 2 - PPSV23) 03/11/2016  . INFLUENZA VACCINE  08/30/2016  . MAMMOGRAM  03/09/2018  . COLONOSCOPY  09/24/2023  . TETANUS/TDAP  08/30/2025  . DEXA SCAN  Completed  . Hepatitis  C Screening  Completed    Lab Results  Component Value Date   WBC 8.5 05/04/2016   HGB 14.1 05/04/2016   HCT 43.6 05/04/2016   PLT 273 05/04/2016   GLUCOSE 121 (H) 06/29/2016   CHOL 255 (H) 03/02/2016   TRIG 353.0 (H) 03/02/2016   HDL 52.10 03/02/2016   LDLDIRECT 143.0 03/02/2016   LDLCALC (H) 01/31/2007    117        Total Cholesterol/HDL:CHD Risk Coronary Heart Disease Risk Table                     Men   Women  1/2 Average Risk   3.4   3.3  Average Risk       5.0   4.4  2 X Average Risk   9.6   7.1  3 X Average Risk  23.4   11.0        Use the calculated Patient Ratio above and the CHD Risk Table to determine the patient's CHD Risk.        ATP III CLASSIFICATION (LDL):  <100     mg/dL   Optimal  100-129  mg/dL   Near or Above                    Optimal  130-159  mg/dL   Borderline  160-189  mg/dL   High  >190     mg/dL   Very High   ALT 64 (H) 06/29/2016   AST 70 (H) 06/29/2016   NA 138 06/29/2016   K 4.1 06/29/2016   CL 102 06/29/2016   CREATININE 0.95 06/29/2016   BUN 18 06/29/2016   CO2 30 06/29/2016   TSH 1.69 03/02/2016   INR 0.99 05/07/2012   HGBA1C 7.1 (H) 06/29/2016    Lab Results  Component Value Date   TSH 1.69 03/02/2016   Lab Results  Component Value Date   WBC 8.5 05/04/2016   HGB 14.1 05/04/2016   HCT 43.6 05/04/2016   MCV 95.4 05/04/2016   PLT 273 05/04/2016   Lab Results  Component Value Date   NA 138 06/29/2016   K 4.1 06/29/2016   CHLORIDE 107 05/04/2016   CO2 30 06/29/2016   GLUCOSE  121 (H) 06/29/2016   BUN 18 06/29/2016   CREATININE 0.95 06/29/2016   BILITOT 0.7 06/29/2016   ALKPHOS 65 06/29/2016   AST 70 (H) 06/29/2016   ALT 64 (H) 06/29/2016   PROT 7.7 06/29/2016   ALBUMIN 4.4 06/29/2016   CALCIUM 10.1 06/29/2016   ANIONGAP 12 (H) 05/04/2016   EGFR 59 (L) 05/04/2016   GFR 61.26 06/29/2016   Lab Results  Component Value Date   CHOL 255 (H) 03/02/2016   Lab Results  Component Value Date   HDL 52.10 03/02/2016   Lab Results  Component Value Date   LDLCALC (H) 01/31/2007    117        Total Cholesterol/HDL:CHD Risk Coronary Heart Disease Risk Table                     Men   Women  1/2 Average Risk   3.4   3.3  Average Risk       5.0   4.4  2 X Average Risk   9.6   7.1  3 X Average Risk  23.4   11.0        Use the calculated Patient Ratio above and the CHD Risk  Table to determine the patient's CHD Risk.        ATP III CLASSIFICATION (LDL):  <100     mg/dL   Optimal  100-129  mg/dL   Near or Above                    Optimal  130-159  mg/dL   Borderline  160-189  mg/dL   High  >190     mg/dL   Very High   Lab Results  Component Value Date   TRIG 353.0 (H) 03/02/2016   Lab Results  Component Value Date   CHOLHDL 5 03/02/2016   Lab Results  Component Value Date   HGBA1C 7.1 (H) 06/29/2016         Assessment & Plan:   Problem List Items Addressed This Visit    Hx of cancer of lung    Dr Barbaraann Faster with oncology sees her annually      Hyperlipidemia - Primary    Encouraged heart healthy diet, increase exercise, avoid trans fats, consider a krill oil cap daily      Osteopenia    Encouraged to get adequate exercise, calcium and vitamin d intake      Low serum vitamin D    Check vitamin D level, only taking the once weekly dose      Relevant Orders   Vitamin D (25 hydroxy) (Completed)   Morbid obesity ([place])    Encouraged DASH diet, decrease po intake and increase exercise as tolerated. Needs 7-8 hours of sleep nightly. Avoid  trans fats, eat small, frequent meals every 4-5 hours with lean proteins, complex carbs and healthy fats. Minimize simple carbs      Gout    Increase hydration, had a bad flare for a month because she was afraid to take the colchicine because of a warning from her pharmacist about safety. No pain today, check uric acid level      Relevant Orders   Uric acid (Completed)   Comprehensive metabolic panel (Completed)   Diarrhea    Increase benefiber to twice daily and use Imodium prn. Avoid offending foods      Hyperglycemia     minimize simple carbs. Increase exercise as tolerated.       Relevant Orders   Hemoglobin A1c (Completed)    Other Visit Diagnoses    SOB (shortness of breath) on exertion       Relevant Orders   ECHOCARDIOGRAM COMPLETE      I am having Ms. Chadderdon maintain her fish oil-omega-3 fatty acids, diphenhydramine-acetaminophen, glucosamine-chondroitin, Biotin, calcium carbonate, meclizine, omeprazole, famotidine, Vitamin D (Ergocalciferol), hyoscyamine, allopurinol, and atorvastatin.  No orders of the defined types were placed in this encounter.   CMA served as Education administrator during this visit. History, Physical and Plan performed by medical provider. Documentation and orders reviewed and attested to.  Penni Homans, MD

## 2016-06-29 NOTE — Assessment & Plan Note (Signed)
Increase hydration, had a bad flare for a month because she was afraid to take the colchicine because of a warning from her pharmacist about safety. No pain today, check uric acid level

## 2016-06-29 NOTE — Assessment & Plan Note (Signed)
minimize simple carbs. Increase exercise as tolerated.  

## 2016-06-29 NOTE — Assessment & Plan Note (Signed)
Encouraged heart healthy diet, increase exercise, avoid trans fats, consider a krill oil cap daily 

## 2016-06-29 NOTE — Assessment & Plan Note (Signed)
Encouraged DASH diet, decrease po intake and increase exercise as tolerated. Needs 7-8 hours of sleep nightly. Avoid trans fats, eat small, frequent meals every 4-5 hours with lean proteins, complex carbs and healthy fats. Minimize simple carbs 

## 2016-06-29 NOTE — Assessment & Plan Note (Signed)
Encouraged to get adequate exercise, calcium and vitamin d intake 

## 2016-07-06 ENCOUNTER — Other Ambulatory Visit: Payer: Self-pay | Admitting: Family Medicine

## 2016-07-12 ENCOUNTER — Ambulatory Visit (HOSPITAL_BASED_OUTPATIENT_CLINIC_OR_DEPARTMENT_OTHER)
Admission: RE | Admit: 2016-07-12 | Discharge: 2016-07-12 | Disposition: A | Discharge status: Home / Self Care | Payer: Medicare HMO | Source: Ambulatory Visit | Attending: Family Medicine | Admitting: Family Medicine

## 2016-07-12 DIAGNOSIS — I081 Rheumatic disorders of both mitral and tricuspid valves: Secondary | ICD-10-CM | POA: Diagnosis not present

## 2016-07-12 DIAGNOSIS — R0602 Shortness of breath: Secondary | ICD-10-CM | POA: Diagnosis not present

## 2016-07-12 LAB — ECHOCARDIOGRAM COMPLETE
CHL CUP RV SYS PRESS: 36 mmHg
E/e' ratio: 14.45
FS: 17 % — AB (ref 28–44)
IV/PV OW: 0.91
LA diam end sys: 42 mm
LA vol index: 20.6 mL/m2
LA vol: 45.5 mL
LADIAMINDEX: 1.9 cm/m2
LASIZE: 42 mm
LAVOLA4C: 45.6 mL
LV E/e' medial: 14.45
LV PW d: 13.8 mm — AB (ref 0.6–1.1)
LV TDI E'LATERAL: 5.87
LV TDI E'MEDIAL: 4.46
LV dias vol index: 32 mL/m2
LV e' LATERAL: 5.87 cm/s
LV sys vol: 45 mL — AB (ref 14–42)
LVDIAVOL: 71 mL (ref 46–106)
LVEEAVG: 14.45
LVOT SV: 45 mL
LVOT VTI: 15.9 cm
LVOT area: 2.84 cm2
LVOT peak vel: 81.7 cm/s
LVOTD: 19 mm
LVSYSVOLIN: 20 mL/m2
MV Peak grad: 3 mmHg
MVPKAVEL: 110 m/s
MVPKEVEL: 84.8 m/s
RV LATERAL S' VELOCITY: 12.7 cm/s
Reg peak vel: 288 cm/s
Simpson's disk: 37
Stroke v: 26 ml
TAPSE: 18.2 mm
TRMAXVEL: 288 cm/s

## 2016-07-12 NOTE — Progress Notes (Signed)
  Echocardiogram 2D Echocardiogram has been performed.  Amanda Crawford 07/12/2016, 9:56 AM

## 2016-07-13 ENCOUNTER — Other Ambulatory Visit: Payer: Self-pay

## 2016-07-13 DIAGNOSIS — R931 Abnormal findings on diagnostic imaging of heart and coronary circulation: Secondary | ICD-10-CM

## 2016-07-24 ENCOUNTER — Encounter: Payer: Self-pay | Admitting: Internal Medicine

## 2016-07-24 ENCOUNTER — Ambulatory Visit (INDEPENDENT_AMBULATORY_CARE_PROVIDER_SITE_OTHER): Payer: Medicare HMO | Admitting: Internal Medicine

## 2016-07-24 VITALS — BP 146/83 | HR 87 | Ht 64.0 in | Wt 228.8 lb

## 2016-07-24 DIAGNOSIS — Z01812 Encounter for preprocedural laboratory examination: Secondary | ICD-10-CM | POA: Diagnosis not present

## 2016-07-24 DIAGNOSIS — D689 Coagulation defect, unspecified: Secondary | ICD-10-CM | POA: Diagnosis not present

## 2016-07-24 DIAGNOSIS — R5383 Other fatigue: Secondary | ICD-10-CM

## 2016-07-24 DIAGNOSIS — I447 Left bundle-branch block, unspecified: Secondary | ICD-10-CM

## 2016-07-24 DIAGNOSIS — R0602 Shortness of breath: Secondary | ICD-10-CM | POA: Diagnosis not present

## 2016-07-24 DIAGNOSIS — I5021 Acute systolic (congestive) heart failure: Secondary | ICD-10-CM

## 2016-07-24 DIAGNOSIS — I5041 Acute combined systolic (congestive) and diastolic (congestive) heart failure: Secondary | ICD-10-CM | POA: Insufficient documentation

## 2016-07-24 DIAGNOSIS — I428 Other cardiomyopathies: Secondary | ICD-10-CM | POA: Insufficient documentation

## 2016-07-24 DIAGNOSIS — I429 Cardiomyopathy, unspecified: Secondary | ICD-10-CM | POA: Diagnosis not present

## 2016-07-24 MED ORDER — CARVEDILOL 3.125 MG PO TABS: 3.1250 mg | ORAL_TABLET | Freq: Two times a day (BID) | ORAL | 3 refills | Status: DC

## 2016-07-24 NOTE — Patient Instructions (Addendum)
  Your physician has recommended you make the following change in your medication:  -- START carvedilol 3.125mg  BID -- START aspirin 81mg  once daily  Your physician recommends that you schedule a follow-up appointment in 2-3 weeks with Dr. Debara Pickett after procedure.     [facility] [address] [place] [address] Dept: [phone] Loc: [place]  07/24/2016  You are scheduled for a Cardiac Catheterization on Thursday, June 28 with Dr. Glenetta Hew.  1. Please arrive at the [facility] (Main Entrance A) at [facility]: [address] [place], [place] [address] at 5:30 AM (two hours before your procedure to ensure your preparation). Free valet parking service is available.   Special note: Every effort is made to have your procedure done on time. Please understand that emergencies sometimes delay scheduled procedures.  2. Diet: Do not eat or drink anything after midnight prior to your procedure except sips of water to take medications.  3. Labs: TODAY  4. Medication instructions in preparation for your procedure:  On the morning of your procedure, take your Aspirin and any morning medicines NOT listed above.  You may use sips of water.  5. Plan for one night stay--bring personal belongings. 6. Bring a current list of your medications and current insurance cards. 7. You MUST have a responsible person to drive you home. 8. Someone MUST be with you the first 24 hours after you arrive home or your discharge will be delayed. 9. Please wear clothes that are easy to get on and off and wear slip-on shoes.  Thank you for allowing [place] to care for you!   -- [facility]

## 2016-07-24 NOTE — Progress Notes (Signed)
OFFICE CONSULT NOTE  Chief Complaint:  Shortness of breath  Primary Care Physician: [name], MD  HPI:  Amanda Crawford is a 71 y.o. female who is being seen today for the evaluation of shortness of breath at the request of [name], MD. Amanda Crawford has a past medical history significant for known left bundle branch block dating back to 2008. At the time she had presented with chest pain that radiated to her back and was found to have the abnormal EKG which led to left heart catheterization in January 2009. This was with Dr. Tamala Julian and indicated angiographically normal coronaries. Subsequently she was found to have renal cell carcinoma and underwent nephrectomy and ultimately was found to have recurrent metastatic renal disease to the long and required 2 partial lobectomies, the last in 2014. She also is moderately obese and has mild dyslipidemia on atorvastatin, but is not diabetic. She reports her shortness of breath has recently worsened and she finds that she has difficulty for example singing at church or caring on a long conversation. She also gets fatigue easier and has difficulty walking up stairs or laying flat. She does report a mild nonproductive cough and slight wheezing. Her PCP ordered an echocardiogram, performed on 07/12/2016 which demonstrated a newly decreased EF to 35-40% from 50%. There was LVH and mild diastolic dysfunction as well as mild mitral regurgitation.  PMHx:  Past Medical History:  Diagnosis Date  . Allergic state 03/12/2015  . Arthritis   . Colon polyp 11/19/2014  . Dermatitis 03/12/2015  . Diarrhea 06/29/2016  . GERD (gastroesophageal reflux disease)    occ  . Gout 03/12/2015  . H/O measles   . H/O mumps   . Headache(784.0)    migraines  . History of chicken pox 11/23/2014  . Hyperglycemia 06/29/2016  . Lung cancer ([place])   . Migraine 11/23/2014  . Obesity 11/23/2014  . Pneumonia    child  . Preventative health care 09/12/2015  . Renal cell  cancer ([place])    renal cell ca dx 9/01 and 8/08;  . Shortness of breath    occ  . Skin lesion of breast 03/12/2015    Past Surgical History:  Procedure Laterality Date  . ABDOMINAL HYSTERECTOMY    . APPENDECTOMY    . CARDIAC CATHETERIZATION     yrs ago neg  . CHOLECYSTECTOMY    . LUNG CANCER SURGERY Left 08  . RENAL MASS EXCISION Left 01  . THORACOTOMY Left 05/09/2012   Procedure: THORACOTOMY MAJOR;  Surgeon: Gaye Pollack, MD;  Location: [facility];  Service: Thoracic;  Laterality: Left;  Marland Kitchen VIDEO BRONCHOSCOPY N/A 05/09/2012   Procedure: VIDEO BRONCHOSCOPY;  Surgeon: Gaye Pollack, MD;  Location: [facility] OR;  Service: Thoracic;  Laterality: N/A;  . WEDGE RESECTION Left 05/09/2012   Procedure: LEFT UPPER LOBE WEDGE RESECTION;  Surgeon: Gaye Pollack, MD;  Location: [facility] OR;  Service: Thoracic;  Laterality: Left;    FAMHx:  Family History  Problem Relation Age of Onset  . Heart failure Father   . Asthma Brother   . Asthma Daughter     SOCHx:   reports that she quit smoking about 10 years ago. Her smoking use included Cigarettes. She has a 17.50 pack-year smoking history. She has never used smokeless tobacco. She reports that she does not drink alcohol or use drugs.  ALLERGIES:  No Known Allergies  ROS: Pertinent items noted in HPI and remainder of comprehensive ROS otherwise negative.  HOME MEDS: Current Outpatient Prescriptions on File Prior to Visit  Medication Sig Dispense Refill  . allopurinol (ZYLOPRIM) 100 MG tablet TAKE 1 TABLET (100 MG TOTAL) BY MOUTH DAILY. 30 tablet 3  . atorvastatin (LIPITOR) 10 MG tablet TAKE 1 TABLET (10 MG TOTAL) BY MOUTH EVERY OTHER DAY. 45 tablet 0  . Biotin 1 MG CAPS Take by mouth.    . Calcium Carbonate (CALTRATE 600) 1500 MG TABS Take by mouth.    . diphenhydramine-acetaminophen (TYLENOL PM) 25-500 MG TABS Take 1 tablet by mouth at bedtime as needed. For insomnia    . famotidine (PEPCID) 20 MG tablet Take 20 mg by mouth at bedtime.    . fish  oil-omega-3 fatty acids 1000 MG capsule Take 2 g by mouth daily.    Marland Kitchen glucosamine-chondroitin 500-400 MG tablet Take 1 tablet by mouth daily.    . hyoscyamine (LEVSIN SL) 0.125 MG SL tablet Place 1 tablet (0.125 mg total) under the tongue every 4 (four) hours as needed. 30 tablet 1  . meclizine (ANTIVERT) 12.5 MG tablet Take 1 tablet (12.5 mg total) by mouth 3 (three) times daily as needed for dizziness. As needed for   Dizziness or nausea 30 tablet 1  . omeprazole (PRILOSEC OTC) 20 MG tablet Take 20 mg by mouth daily before breakfast.    . Vitamin D, Ergocalciferol, (DRISDOL) 50000 units CAPS capsule TAKE 1 CAPSULE (50,000 UNITS TOTAL) BY MOUTH EVERY 7 (SEVEN) DAYS. TAKE FOR 12 WEEKS 4 capsule 4   No current facility-administered medications on file prior to visit.     LABS/IMAGING: No results found for this or any previous visit (from the past 48 hour(s)). No results found.  LIPID PANEL:    Component Value Date/Time   CHOL 255 (H) 03/02/2016 0952   TRIG 353.0 (H) 03/02/2016 0952   HDL 52.10 03/02/2016 0952   CHOLHDL 5 03/02/2016 0952   VLDL 70.6 (H) 03/02/2016 0952   LDLCALC (H) 01/31/2007 0800    117        Total Cholesterol/HDL:CHD Risk Coronary Heart Disease Risk Table                     Men   Women  1/2 Average Risk   3.4   3.3  Average Risk       5.0   4.4  2 X Average Risk   9.6   7.1  3 X Average Risk  23.4   11.0        Use the calculated Patient Ratio above and the CHD Risk Table to determine the patient's CHD Risk.        ATP III CLASSIFICATION (LDL):  <100     mg/dL   Optimal  100-129  mg/dL   Near or Above                    Optimal  130-159  mg/dL   Borderline  160-189  mg/dL   High  >190     mg/dL   Very High   LDLDIRECT 143.0 03/02/2016 0952    WEIGHTS: Wt Readings from Last 3 Encounters:  07/24/16 228 lb 12.8 oz (103.8 kg)  06/29/16 228 lb (103.4 kg)  05/04/16 228 lb 11.2 oz (103.7 kg)    VITALS: BP (!) 146/83   Pulse 87   Ht 5\' 4"  (1.626 m)    Wt 228 lb 12.8 oz (103.8 kg)   BMI 39.27 kg/m   EXAM: General appearance:  alert and no distress Neck: no carotid bruit and no JVD Lungs: clear to auscultation bilaterally Heart: regular rate and rhythm, S1, S2 normal, no murmur, click, rub or gallop Abdomen: soft, non-tender; bowel sounds normal; no masses,  no organomegaly Extremities: edema Trace sock line edema Pulses: 2+ and symmetric Skin: Skin color, texture, turgor normal. No rashes or lesions Neurologic: Grossly normal Psych: Pleasant  EKG: Normal sinus rhythm at 87, LBBB - personally reviewed  ASSESSMENT: 1. Progressive dyspnea and exertion 2. LBBB 3. New onset cardiomyopathy with EF 35-40%  PLAN: 1.   Mrs. Alveta Heimlich has a new onset cardiomyopathy with EF 35-40% and a pre-existing LBBB, which could be implicated in the development of LV dysfunction. I suspect is not related to ischemia as she had a ready had a heart catheterization to evaluate this in 2009 which showed normal coronaries, but she certainly could've developed new coronary artery disease which has led to her LV dysfunction. She was noted to have akinesis of the anteroseptal wall as well suggestive of possible coronary artery disease. This however may be related to left bundle branch block. I feel that she needs definitive heart catheterization to evaluate and exclude any new coronary artery disease. We'll schedule her for right and left heart catheterization as evaluation of her LV filling pressures are difficult. I do feel that she is somewhat volume overloaded on exam today. Will obtain normal pre-Labs including a BNP and consider starting diuretic as needed. We'll also recommend starting low-dose aspirin and carvedilol 3.125 mg twice a day today. She is noted to have a single kidney after nephrectomy for RCC. Will need to be careful about using ACE-I, ARB or Entresto.  Follow-up with me after cath. Thanks again for the consultation.  Pixie Casino, MD, [place]  Attending Cardiologist  Direct Dial: [phone]  Fax: [phone]  Website:  [email or web address] 07/24/2016, 3:05 PM

## 2016-07-25 ENCOUNTER — Other Ambulatory Visit: Payer: Self-pay | Admitting: *Deleted

## 2016-07-25 ENCOUNTER — Telehealth: Payer: Self-pay

## 2016-07-25 LAB — BASIC METABOLIC PANEL
BUN/Creatinine Ratio: 17 (ref 12–28)
BUN: 15 mg/dL (ref 8–27)
CO2: 24 mmol/L (ref 20–29)
Calcium: 10.1 mg/dL (ref 8.7–10.3)
Chloride: 102 mmol/L (ref 96–106)
Creatinine, Ser: 0.89 mg/dL (ref 0.57–1.00)
GFR calc Af Amer: 74 mL/min/{1.73_m2} (ref >59)
GFR calc non Af Amer: 65 mL/min/{1.73_m2} (ref >59)
Glucose: 100 mg/dL — ABNORMAL HIGH (ref 65–99)
Potassium: 4.9 mmol/L (ref 3.5–5.2)
Sodium: 141 mmol/L (ref 134–144)

## 2016-07-25 LAB — APTT: aPTT: 23 s — ABNORMAL LOW (ref 24–33)

## 2016-07-25 LAB — CBC
Hematocrit: 43.2 % (ref 34.0–46.6)
Hemoglobin: 14.1 g/dL (ref 11.1–15.9)
MCH: 30.8 pg (ref 26.6–33.0)
MCHC: 32.6 g/dL (ref 31.5–35.7)
MCV: 94 fL (ref 79–97)
Platelets: 261 10*3/uL (ref 150–379)
RBC: 4.58 x10E6/uL (ref 3.77–5.28)
RDW: 13.7 % (ref 12.3–15.4)
WBC: 11.5 10*3/uL — ABNORMAL HIGH (ref 3.4–10.8)

## 2016-07-25 LAB — PROTIME-INR
INR: 1 (ref 0.8–1.2)
Prothrombin Time: 10.5 s (ref 9.1–12.0)

## 2016-07-25 LAB — TSH: TSH: 2.65 u[IU]/mL (ref 0.450–4.500)

## 2016-07-25 LAB — BRAIN NATRIURETIC PEPTIDE: BNP: 43.8 pg/mL (ref 0.0–100.0)

## 2016-07-25 NOTE — Telephone Encounter (Signed)
Patient contacted pre-catheterization at [facility] scheduled for: 07/27/2016 @ 0730 Verified arrival time and place: 0530 @ [facility] Confirmed AM meds to be taken pre-cath with sip of water: Notified Pt to take her baby ASA prior to arrival.  Pt indicates understanding. Confirmed patient has responsible person to drive home post procedure and observe patient for 24 hours: husband Addl concerns:  Pt states she just filled her prescription for carvedilol.  Pt asked if she should try to get 2 doses in today, or just take one dose today and then start 2 doses tomorrow.  Recommended she just take one dose today.  Pt indicates understanding.  Pt asked if she would have to spend the night.  Replied most patient's go home, but if there was any reason to keep her overnight they would.  Reiterated Pt should be prepared to stay the night.  Pt indicates understanding. Pt with single kidney-Cr 0.89

## 2016-07-27 ENCOUNTER — Ambulatory Visit (HOSPITAL_COMMUNITY)
Admission: RE | Disposition: A | Discharge status: Home / Self Care | Payer: Self-pay | Source: Ambulatory Visit | Attending: Cardiology

## 2016-07-27 ENCOUNTER — Encounter (HOSPITAL_COMMUNITY): Payer: Self-pay | Admitting: Cardiology

## 2016-07-27 ENCOUNTER — Ambulatory Visit (HOSPITAL_COMMUNITY)
Admission: RE | Admit: 2016-07-27 | Discharge: 2016-07-27 | Disposition: A | Discharge status: Home / Self Care | Payer: Medicare HMO | Source: Ambulatory Visit | Attending: Cardiology | Admitting: Cardiology

## 2016-07-27 DIAGNOSIS — M109 Gout, unspecified: Secondary | ICD-10-CM | POA: Diagnosis not present

## 2016-07-27 DIAGNOSIS — Z902 Acquired absence of lung [part of]: Secondary | ICD-10-CM | POA: Diagnosis not present

## 2016-07-27 DIAGNOSIS — G43909 Migraine, unspecified, not intractable, without status migrainosus: Secondary | ICD-10-CM | POA: Diagnosis not present

## 2016-07-27 DIAGNOSIS — E785 Hyperlipidemia, unspecified: Secondary | ICD-10-CM | POA: Insufficient documentation

## 2016-07-27 DIAGNOSIS — I2729 Other secondary pulmonary hypertension: Secondary | ICD-10-CM | POA: Diagnosis not present

## 2016-07-27 DIAGNOSIS — Z6839 Body mass index (BMI) 39.0-39.9, adult: Secondary | ICD-10-CM | POA: Diagnosis not present

## 2016-07-27 DIAGNOSIS — M1990 Unspecified osteoarthritis, unspecified site: Secondary | ICD-10-CM | POA: Insufficient documentation

## 2016-07-27 DIAGNOSIS — Z85118 Personal history of other malignant neoplasm of bronchus and lung: Secondary | ICD-10-CM | POA: Diagnosis not present

## 2016-07-27 DIAGNOSIS — E669 Obesity, unspecified: Secondary | ICD-10-CM | POA: Insufficient documentation

## 2016-07-27 DIAGNOSIS — R0600 Dyspnea, unspecified: Secondary | ICD-10-CM | POA: Diagnosis present

## 2016-07-27 DIAGNOSIS — Z905 Acquired absence of kidney: Secondary | ICD-10-CM | POA: Diagnosis not present

## 2016-07-27 DIAGNOSIS — I447 Left bundle-branch block, unspecified: Secondary | ICD-10-CM | POA: Insufficient documentation

## 2016-07-27 DIAGNOSIS — Z85528 Personal history of other malignant neoplasm of kidney: Secondary | ICD-10-CM | POA: Insufficient documentation

## 2016-07-27 DIAGNOSIS — I420 Dilated cardiomyopathy: Secondary | ICD-10-CM | POA: Diagnosis not present

## 2016-07-27 DIAGNOSIS — K219 Gastro-esophageal reflux disease without esophagitis: Secondary | ICD-10-CM | POA: Insufficient documentation

## 2016-07-27 DIAGNOSIS — I5041 Acute combined systolic (congestive) and diastolic (congestive) heart failure: Secondary | ICD-10-CM | POA: Diagnosis not present

## 2016-07-27 DIAGNOSIS — Z87891 Personal history of nicotine dependence: Secondary | ICD-10-CM | POA: Diagnosis not present

## 2016-07-27 DIAGNOSIS — I428 Other cardiomyopathies: Secondary | ICD-10-CM

## 2016-07-27 HISTORY — PX: RIGHT/LEFT HEART CATH AND CORONARY ANGIOGRAPHY: CATH118266

## 2016-07-27 LAB — POCT I-STAT 3, VENOUS BLOOD GAS (G3P V)
ACID-BASE EXCESS: 1 mmol/L (ref 0.0–2.0)
BICARBONATE: 26.9 mmol/L (ref 20.0–28.0)
Bicarbonate: 26.7 mmol/L (ref 20.0–28.0)
O2 SAT: 64 %
O2 Saturation: 67 %
PCO2 VEN: 51.4 mmHg (ref 44.0–60.0)
PH VEN: 7.324 (ref 7.250–7.430)
PH VEN: 7.353 (ref 7.250–7.430)
PO2 VEN: 36 mmHg (ref 32.0–45.0)
TCO2: 28 mmol/L (ref 0–100)
TCO2: 28 mmol/L (ref 0–100)
pCO2, Ven: 48.5 mmHg (ref 44.0–60.0)
pO2, Ven: 37 mmHg (ref 32.0–45.0)

## 2016-07-27 LAB — POCT I-STAT 3, ART BLOOD GAS (G3+)
ACID-BASE DEFICIT: 1 mmol/L (ref 0.0–2.0)
Bicarbonate: 25 mmol/L (ref 20.0–28.0)
O2 SAT: 95 %
TCO2: 26 mmol/L (ref 0–100)
pCO2 arterial: 45.3 mmHg (ref 32.0–48.0)
pH, Arterial: 7.349 — ABNORMAL LOW (ref 7.350–7.450)
pO2, Arterial: 81 mmHg — ABNORMAL LOW (ref 83.0–108.0)

## 2016-07-27 SURGERY — RIGHT/LEFT HEART CATH AND CORONARY ANGIOGRAPHY
Anesthesia: LOCAL

## 2016-07-27 MED ORDER — SODIUM CHLORIDE 0.9 % WEIGHT BASED INFUSION
3.0000 mL/kg/h | INTRAVENOUS | Status: AC
  Administered 2016-07-27: 3 mL/kg/h via INTRAVENOUS

## 2016-07-27 MED ORDER — SODIUM CHLORIDE 0.9% FLUSH: 3.0000 mL | INTRAVENOUS | Status: DC | PRN

## 2016-07-27 MED ORDER — SODIUM CHLORIDE 0.9% FLUSH: 3.0000 mL | Freq: Two times a day (BID) | INTRAVENOUS | Status: DC

## 2016-07-27 MED ORDER — SODIUM CHLORIDE 0.9 % WEIGHT BASED INFUSION: 1.0000 mL/kg/h | INTRAVENOUS | Status: DC

## 2016-07-27 MED ORDER — ASPIRIN 81 MG PO CHEW: 81.0000 mg | CHEWABLE_TABLET | ORAL | Status: DC

## 2016-07-27 MED ORDER — IOPAMIDOL (ISOVUE-370) INJECTION 76%
INTRAVENOUS | Status: AC
  Filled 2016-07-27: qty 100

## 2016-07-27 MED ORDER — HEPARIN SODIUM (PORCINE) 1000 UNIT/ML IJ SOLN
INTRAMUSCULAR | Status: AC
  Filled 2016-07-27: qty 1

## 2016-07-27 MED ORDER — FUROSEMIDE 40 MG PO TABS: 40.0000 mg | ORAL_TABLET | Freq: Every day | ORAL | Status: DC

## 2016-07-27 MED ORDER — LIDOCAINE HCL 1 % IJ SOLN
INTRAMUSCULAR | Status: AC
  Filled 2016-07-27: qty 20

## 2016-07-27 MED ORDER — HEPARIN SODIUM (PORCINE) 1000 UNIT/ML IJ SOLN
INTRAMUSCULAR | Status: DC | PRN
  Administered 2016-07-27: 5000 [IU] via INTRAVENOUS

## 2016-07-27 MED ORDER — VERAPAMIL HCL 2.5 MG/ML IV SOLN
INTRAVENOUS | Status: AC
  Filled 2016-07-27: qty 2

## 2016-07-27 MED ORDER — LIDOCAINE HCL (PF) 1 % IJ SOLN
INTRAMUSCULAR | Status: DC | PRN
  Administered 2016-07-27: 3 mL via SUBCUTANEOUS
  Administered 2016-07-27: 2 mL via SUBCUTANEOUS

## 2016-07-27 MED ORDER — ONDANSETRON HCL 4 MG/2ML IJ SOLN: 4.0000 mg | Freq: Four times a day (QID) | INTRAMUSCULAR | Status: DC | PRN

## 2016-07-27 MED ORDER — ACETAMINOPHEN 325 MG PO TABS: 650.0000 mg | ORAL_TABLET | ORAL | Status: DC | PRN

## 2016-07-27 MED ORDER — MIDAZOLAM HCL 2 MG/2ML IJ SOLN
INTRAMUSCULAR | Status: DC | PRN
  Administered 2016-07-27: 1 mg via INTRAVENOUS

## 2016-07-27 MED ORDER — SODIUM CHLORIDE 0.9 % IV SOLN: 250.0000 mL | INTRAVENOUS | Status: DC | PRN

## 2016-07-27 MED ORDER — FENTANYL CITRATE (PF) 100 MCG/2ML IJ SOLN
INTRAMUSCULAR | Status: DC | PRN
  Administered 2016-07-27: 25 ug via INTRAVENOUS

## 2016-07-27 MED ORDER — FENTANYL CITRATE (PF) 100 MCG/2ML IJ SOLN
INTRAMUSCULAR | Status: AC
  Filled 2016-07-27: qty 2

## 2016-07-27 MED ORDER — IOPAMIDOL (ISOVUE-370) INJECTION 76%
INTRAVENOUS | Status: DC | PRN
  Administered 2016-07-27: 60 mL via INTRA_ARTERIAL

## 2016-07-27 MED ORDER — SODIUM CHLORIDE 0.9 % IV SOLN: INTRAVENOUS | Status: DC

## 2016-07-27 MED ORDER — MIDAZOLAM HCL 2 MG/2ML IJ SOLN
INTRAMUSCULAR | Status: AC
  Filled 2016-07-27: qty 2

## 2016-07-27 MED ORDER — HEPARIN (PORCINE) IN NACL 2-0.9 UNIT/ML-% IJ SOLN
INTRAMUSCULAR | Status: AC | PRN
  Administered 2016-07-27: 1000 mL

## 2016-07-27 MED ORDER — FUROSEMIDE 40 MG PO TABS: 40.0000 mg | ORAL_TABLET | Freq: Every day | ORAL | 3 refills | Status: DC

## 2016-07-27 MED ORDER — HEPARIN (PORCINE) IN NACL 2-0.9 UNIT/ML-% IJ SOLN
INTRAMUSCULAR | Status: AC
  Filled 2016-07-27: qty 1000

## 2016-07-27 SURGICAL SUPPLY — 11 items

## 2016-07-27 NOTE — Brief Op Note (Signed)
    07/27/2016  8:25 AM  PATIENT:  Amanda Crawford  71 y.o. female with recently diagnosed cardiomyopathy EF roughly 35% on echocardiogram performed for evaluation of dyspnea. She has left bundle branch block making regional wall motion difficult to assess. She is referred for right now for catheterization by Dr. Debara Pickett.  PRE-OPERATIVE DIAGNOSIS:  sob, acute systolic hf, LBBB  POST-OPERATIVE DIAGNOSIS:   Nonischemic cardiomyopathy: EF appears more to be 45% with anteroapical kinesis.  Angiographically normal coronary arteries with a Circumflex dominant system.  Borderline mild secondary pulmonary hypertension: RA pressure 16 mm, RV pressures 39/15/18 mmHg. PA pressure 34/16/25 mmHg. Wedge pressure 17-20 mmHg. Sugars 1:30/11/22 mm with AO pressure 138/77/102 mmHg  PA sat average 66%. AO sat average 95%. Cardiac Output with Fick: 4.93, Index 2.39  PROCEDURE:  Procedure(s): Right/Left Heart Cath and Coronary Angiography (N/A)  SURGEON:  Surgeon(s) and Role:    * Leonie Man, MD - Primary  ANESTHESIA:   local and IV sedation  EBL:  < 50 mL  BLOOD ADMINISTERED:none  ACCESS:   Right radial arterial access obtained using micropuncture kit. A 6 French sheath placed  The existing right antecubital IV was exchanged using Seldinger technique for a 5 French glide sheath.  PROCEDURE:  Right Heart Catheterization: 5 French Swan-Ganz catheter advanced through the antecubital sheath to the RA, RV, PA and wedge position for hemodynamic measurement. Oxygen saturations are obtained the PA position X 2.  Once the wedge position was then reconfirmed, the catheter was removed out of the body with balloon down.  Left Heart Catheterization: JR4 followed by JL 3.5 catheters were advanced and exchanged over long exchange safety J-wire. A JR4 catheter was used to cross the aortic valve for LV hemodynamics And left ventriculography with hand injection. It was then pulled back across the aortic valve  for pulmonary gradient and using his right coronary artery for cineangiography. This was exchanged then for a JL 3.5 catheter for left coronary angiography. Catheter was removed completely out of without complication.  During this procedure the patient is administered a total of Versed 1 mg and Fentanyl 25 mg to achieve and maintain moderate conscious sedation.  The patient's heart rate, blood pressure, and oxygen saturation are monitored continuously during the procedure. The period of conscious sedation is 42 minutes, of which I was present face-to-face 100% of this time.  Femoral / Brachial Sheath(s) removed in the Cath Lab with manual pressure for hemostasis.    Radial sheath removed in the Cardiac Catheterization lab with TR Band placed for hemostasis.  TR Band: 0825  Hours; 12 mL air  MEDICATIONS * SQ Lidocaine 44m * Radial Cocktail: 3 mg Verapmil in 10 mL NS * Isovue Contrast: 60 mL * Heparin: 5000 Units  SPECIMEN:  None  COUNTS:  YES  DICTATION: .Note written in EPIC  PLAN OF CARE: Discharge to home after PACU; will initiate treatment with Lasix. Start with 40 mg twice a day 3 days and then reduce to 40 mg daily. She will follow-up with Dr. HDebara Pickettin 1-2 weeks.  PATIENT DISPOSITION:  PACU - hemodynamically stable.   Delay start of Pharmacological VTE agent (>24hrs) due to surgical blood loss or risk of bleeding: not applicable   DGlenetta Hew M.D., M.S. Interventional Cardiologist   Pager # [phone] # [phone] [address] [place] [id number]

## 2016-07-27 NOTE — Interval H&P Note (Signed)
History and Physical Interval Note:  07/27/2016 7:38 AM  Amanda Crawford  has presented today for surgery, with the diagnosis of sob, acute systolic hf, LBBB  The various methods of treatment have been discussed with the patient and family. After consideration of risks, benefits and other options for treatment, the patient has consented to  Procedure(s): Right/Left Heart Cath and Coronary Angiography (N/A) with possible Percutaneous Coronary Intervention as a surgical intervention .   The patient's history has been reviewed, patient examined, no change in status, stable for surgery.  I have reviewed the patient's chart and labs.  Questions were answered to the patient's satisfaction.    Cath Lab Visit (complete for each Cath Lab visit)  Clinical Evaluation Leading to the Procedure:   ACS: No.  Non-ACS:    Anginal Classification: CCS III - dyspnea  Anti-ischemic medical therapy: Minimal Therapy (1 class of medications)  Non-Invasive Test Results: No non-invasive testing performed; High Risk Echo results - newly reduced LVEF  Prior CABG: No previous CABG   Glenetta Hew

## 2016-07-27 NOTE — H&P (View-Only) (Signed)
OFFICE CONSULT NOTE  Chief Complaint:  Shortness of breath  Primary Care Physician: [name], MD  HPI:  Amanda Crawford is a 71 y.o. female who is being seen today for the evaluation of shortness of breath at the request of [name], MD. Mrs. Clos has a past medical history significant for known left bundle branch block dating back to 2008. At the time she had presented with chest pain that radiated to her back and was found to have the abnormal EKG which led to left heart catheterization in January 2009. This was with Dr. Tamala Julian and indicated angiographically normal coronaries. Subsequently she was found to have renal cell carcinoma and underwent nephrectomy and ultimately was found to have recurrent metastatic renal disease to the long and required 2 partial lobectomies, the last in 2014. She also is moderately obese and has mild dyslipidemia on atorvastatin, but is not diabetic. She reports her shortness of breath has recently worsened and she finds that she has difficulty for example singing at church or caring on a long conversation. She also gets fatigue easier and has difficulty walking up stairs or laying flat. She does report a mild nonproductive cough and slight wheezing. Her PCP ordered an echocardiogram, performed on 07/12/2016 which demonstrated a newly decreased EF to 35-40% from 50%. There was LVH and mild diastolic dysfunction as well as mild mitral regurgitation.  PMHx:  Past Medical History:  Diagnosis Date  . Allergic state 03/12/2015  . Arthritis   . Colon polyp 11/19/2014  . Dermatitis 03/12/2015  . Diarrhea 06/29/2016  . GERD (gastroesophageal reflux disease)    occ  . Gout 03/12/2015  . H/O measles   . H/O mumps   . Headache(784.0)    migraines  . History of chicken pox 11/23/2014  . Hyperglycemia 06/29/2016  . Lung cancer ([place])   . Migraine 11/23/2014  . Obesity 11/23/2014  . Pneumonia    child  . Preventative health care 09/12/2015  . Renal cell  cancer ([place])    renal cell ca dx 9/01 and 8/08;  . Shortness of breath    occ  . Skin lesion of breast 03/12/2015    Past Surgical History:  Procedure Laterality Date  . ABDOMINAL HYSTERECTOMY    . APPENDECTOMY    . CARDIAC CATHETERIZATION     yrs ago neg  . CHOLECYSTECTOMY    . LUNG CANCER SURGERY Left 08  . RENAL MASS EXCISION Left 01  . THORACOTOMY Left 05/09/2012   Procedure: THORACOTOMY MAJOR;  Surgeon: Gaye Pollack, MD;  Location: [facility];  Service: Thoracic;  Laterality: Left;  Marland Kitchen VIDEO BRONCHOSCOPY N/A 05/09/2012   Procedure: VIDEO BRONCHOSCOPY;  Surgeon: Gaye Pollack, MD;  Location: [facility] OR;  Service: Thoracic;  Laterality: N/A;  . WEDGE RESECTION Left 05/09/2012   Procedure: LEFT UPPER LOBE WEDGE RESECTION;  Surgeon: Gaye Pollack, MD;  Location: [facility] OR;  Service: Thoracic;  Laterality: Left;    FAMHx:  Family History  Problem Relation Age of Onset  . Heart failure Father   . Asthma Brother   . Asthma Daughter     SOCHx:   reports that she quit smoking about 10 years ago. Her smoking use included Cigarettes. She has a 17.50 pack-year smoking history. She has never used smokeless tobacco. She reports that she does not drink alcohol or use drugs.  ALLERGIES:  No Known Allergies  ROS: Pertinent items noted in HPI and remainder of comprehensive ROS otherwise negative.  HOME MEDS: Current Outpatient Prescriptions on File Prior to Visit  Medication Sig Dispense Refill  . allopurinol (ZYLOPRIM) 100 MG tablet TAKE 1 TABLET (100 MG TOTAL) BY MOUTH DAILY. 30 tablet 3  . atorvastatin (LIPITOR) 10 MG tablet TAKE 1 TABLET (10 MG TOTAL) BY MOUTH EVERY OTHER DAY. 45 tablet 0  . Biotin 1 MG CAPS Take by mouth.    . Calcium Carbonate (CALTRATE 600) 1500 MG TABS Take by mouth.    . diphenhydramine-acetaminophen (TYLENOL PM) 25-500 MG TABS Take 1 tablet by mouth at bedtime as needed. For insomnia    . famotidine (PEPCID) 20 MG tablet Take 20 mg by mouth at bedtime.    . fish  oil-omega-3 fatty acids 1000 MG capsule Take 2 g by mouth daily.    Marland Kitchen glucosamine-chondroitin 500-400 MG tablet Take 1 tablet by mouth daily.    . hyoscyamine (LEVSIN SL) 0.125 MG SL tablet Place 1 tablet (0.125 mg total) under the tongue every 4 (four) hours as needed. 30 tablet 1  . meclizine (ANTIVERT) 12.5 MG tablet Take 1 tablet (12.5 mg total) by mouth 3 (three) times daily as needed for dizziness. As needed for   Dizziness or nausea 30 tablet 1  . omeprazole (PRILOSEC OTC) 20 MG tablet Take 20 mg by mouth daily before breakfast.    . Vitamin D, Ergocalciferol, (DRISDOL) 50000 units CAPS capsule TAKE 1 CAPSULE (50,000 UNITS TOTAL) BY MOUTH EVERY 7 (SEVEN) DAYS. TAKE FOR 12 WEEKS 4 capsule 4   No current facility-administered medications on file prior to visit.     LABS/IMAGING: No results found for this or any previous visit (from the past 48 hour(s)). No results found.  LIPID PANEL:    Component Value Date/Time   CHOL 255 (H) 03/02/2016 0952   TRIG 353.0 (H) 03/02/2016 0952   HDL 52.10 03/02/2016 0952   CHOLHDL 5 03/02/2016 0952   VLDL 70.6 (H) 03/02/2016 0952   LDLCALC (H) 01/31/2007 0800    117        Total Cholesterol/HDL:CHD Risk Coronary Heart Disease Risk Table                     Men   Women  1/2 Average Risk   3.4   3.3  Average Risk       5.0   4.4  2 X Average Risk   9.6   7.1  3 X Average Risk  23.4   11.0        Use the calculated Patient Ratio above and the CHD Risk Table to determine the patient's CHD Risk.        ATP III CLASSIFICATION (LDL):  <100     mg/dL   Optimal  100-129  mg/dL   Near or Above                    Optimal  130-159  mg/dL   Borderline  160-189  mg/dL   High  >190     mg/dL   Very High   LDLDIRECT 143.0 03/02/2016 0952    WEIGHTS: Wt Readings from Last 3 Encounters:  07/24/16 228 lb 12.8 oz (103.8 kg)  06/29/16 228 lb (103.4 kg)  05/04/16 228 lb 11.2 oz (103.7 kg)    VITALS: BP (!) 146/83   Pulse 87   Ht 5\' 4"  (1.626 m)    Wt 228 lb 12.8 oz (103.8 kg)   BMI 39.27 kg/m   EXAM: General appearance:  alert and no distress Neck: no carotid bruit and no JVD Lungs: clear to auscultation bilaterally Heart: regular rate and rhythm, S1, S2 normal, no murmur, click, rub or gallop Abdomen: soft, non-tender; bowel sounds normal; no masses,  no organomegaly Extremities: edema Trace sock line edema Pulses: 2+ and symmetric Skin: Skin color, texture, turgor normal. No rashes or lesions Neurologic: Grossly normal Psych: Pleasant  EKG: Normal sinus rhythm at 87, LBBB - personally reviewed  ASSESSMENT: 1. Progressive dyspnea and exertion 2. LBBB 3. New onset cardiomyopathy with EF 35-40%  PLAN: 1.   Mrs. Alveta Heimlich has a new onset cardiomyopathy with EF 35-40% and a pre-existing LBBB, which could be implicated in the development of LV dysfunction. I suspect is not related to ischemia as she had a ready had a heart catheterization to evaluate this in 2009 which showed normal coronaries, but she certainly could've developed new coronary artery disease which has led to her LV dysfunction. She was noted to have akinesis of the anteroseptal wall as well suggestive of possible coronary artery disease. This however may be related to left bundle branch block. I feel that she needs definitive heart catheterization to evaluate and exclude any new coronary artery disease. We'll schedule her for right and left heart catheterization as evaluation of her LV filling pressures are difficult. I do feel that she is somewhat volume overloaded on exam today. Will obtain normal pre-Labs including a BNP and consider starting diuretic as needed. We'll also recommend starting low-dose aspirin and carvedilol 3.125 mg twice a day today. She is noted to have a single kidney after nephrectomy for RCC. Will need to be careful about using ACE-I, ARB or Entresto.  Follow-up with me after cath. Thanks again for the consultation.  Pixie Casino, MD, [place]  Attending Cardiologist  Direct Dial: [phone]  Fax: [phone]  Website:  [email or web address] 07/24/2016, 3:05 PM

## 2016-07-27 NOTE — Discharge Instructions (Signed)

## 2016-07-28 ENCOUNTER — Telehealth: Payer: Self-pay | Admitting: Internal Medicine

## 2016-07-28 MED FILL — Verapamil HCl IV Soln 2.5 MG/ML: INTRAVENOUS | Qty: 2 | Status: AC

## 2016-07-28 NOTE — Telephone Encounter (Signed)
Called patient to schedule for cath followup and left my number to call back to schedule this.

## 2016-07-28 NOTE — Telephone Encounter (Signed)
Left detailed message ok to take tylenol only, call back if any further questions

## 2016-07-28 NOTE — Telephone Encounter (Signed)
New message    Pt is calling to find out if she can take medication for a headache after having a cath done. Please call.

## 2016-08-03 ENCOUNTER — Telehealth: Payer: Self-pay | Admitting: Internal Medicine

## 2016-08-03 NOTE — Telephone Encounter (Signed)
LMTCB in regards to concerns documented below

## 2016-08-03 NOTE — Telephone Encounter (Signed)
-----   Message from Fidel Levy, RN sent at 08/01/2016 10:03 AM EDT ----- Regarding: RE: followup Amanda Crawford   The patient told me that when she got home after her cath that she had a terrible headache, like a migraine type. She didn't take anything for it, but, was worried about it. Can you give her a call?   Thanks  [place] ----- Message ----- From: Shirl Harris I Sent: 07/31/2016   1:26 PM To: Fidel Levy, RN Subject: followup                                         Amanda Crawford  This is the patient that had the cath.  [facility]

## 2016-08-15 NOTE — Progress Notes (Signed)
Cardiology Office Note    Date:  08/16/2016   ID:  Amanda Crawford, DOB 1945/05/06, MRN [id number]  PCP:  [name], MD  Cardiologist:  Dr. Debara Pickett   Chief Complaint  Patient presents with  . Follow-up    seen for Dr. Debara Pickett.    History of Present Illness:  Amanda Crawford is a 71 y.o. female with PMH of hyperlipidemia and history of renal cell cancer who was recently evaluated for shortness of breath. She has a known history of left bundle branch block dating back to 2008. She notice occasional chest discomfort radiating to the back. She had a chronic catheterization in January 2009 which indicated normal coronaries. She later underwent nephrectomy for renal cell carcinoma and ultimately found to have recurrent metastatic renal disease to the lung and require to partial lobectomy. More recently, she was having more difficulty seeing in the church or carryout long conversations. Echocardiogram ordered by primary care physician on 07/12/2016 demonstrated newly decreased ejection fraction of 35-40% from previous 50%, LVH, mild diastolic dysfunction as well as mild mitral regurgitation.  Due to new onset of cardiomyopathy and progressive dyspnea on exertion, left and right heart cath was recommended. Patient underwent the scheduled a seizure on 07/27/2016, and this showed 25% mid LAD lesion, 35-45% EF, moderate LV systolic dysfunction, moderately elevated LVEDP, hemodynamic consistent with mild pulmonary hypertension. Mean wedge pressure was 20 mmHg, PA pressure 34/16/25. Cardiac output 4.93 L/min, cardiac index 2.39. She was started on 40 mg daily of Lasix.  Patient presents today for cardiology office visit. She denies any significant shortness of breath, lower extremity edema, orthopnea or PND. She has been taking her Lasix and the noticed very good urinary output. We discussed the nature of nonischemic cardiomyopathy in this case. She is arteriography 0.125 mg twice a day of carvedilol, I will  add 0.5 milligrams daily of losartan. Given her history of nephrectomy and single kidney, we will need to monitor renal function closely. She will have a basic metabolic panel today and in one week. Review of systems recent lipid panel and LFTs in February revealed triglyceride level greater than 350 and total cholesterol level greater than 250. I will add fish oil at this time. It appears he his Lipitor was not increased recently because mildly elevated LFT. She is due for repeat fasting lipid panel and 50 next week at her primary care provider's office. I will see her back in 3-4 weeks for further heart failure medication adjustment. I will defer the decision to obtain repeat echocardiogram to Dr. Debara Pickett.  Past Medical History:  Diagnosis Date  . Allergic state 03/12/2015  . Arthritis   . Colon polyp 11/19/2014  . Dermatitis 03/12/2015  . Diarrhea 06/29/2016  . GERD (gastroesophageal reflux disease)    occ  . Gout 03/12/2015  . H/O measles   . H/O mumps   . Headache(784.0)    migraines  . History of chicken pox 11/23/2014  . Hyperglycemia 06/29/2016  . Lung cancer ([place])   . Migraine 11/23/2014  . Obesity 11/23/2014  . Pneumonia    child  . Preventative health care 09/12/2015  . Renal cell cancer ([place])    renal cell ca dx 9/01 and 8/08;  . Shortness of breath    occ  . Skin lesion of breast 03/12/2015    Past Surgical History:  Procedure Laterality Date  . ABDOMINAL HYSTERECTOMY    . APPENDECTOMY    . CARDIAC CATHETERIZATION  yrs ago neg  . CHOLECYSTECTOMY    . LUNG CANCER SURGERY Left 08  . RENAL MASS EXCISION Left 01  . RIGHT/LEFT HEART CATH AND CORONARY ANGIOGRAPHY N/A 07/27/2016   Procedure: Right/Left Heart Cath and Coronary Angiography;  Surgeon: Leonie Man, MD;  Location: [facility];  Service: Cardiovascular;  Laterality: N/A;  . THORACOTOMY Left 05/09/2012   Procedure: THORACOTOMY MAJOR;  Surgeon: Gaye Pollack, MD;  Location: [facility];  Service: Thoracic;   Laterality: Left;  Marland Kitchen VIDEO BRONCHOSCOPY N/A 05/09/2012   Procedure: VIDEO BRONCHOSCOPY;  Surgeon: Gaye Pollack, MD;  Location[facility];  Service: Thoracic;  Laterality: N/A;  . WEDGE RESECTION Left 05/09/2012   Procedure: LEFT UPPER LOBE WEDGE RESECTION;  Surgeon: Gaye Pollack, MD;  Location: [facility] OR;  Service: Thoracic;  Laterality: Left;    Current Medications: Outpatient Medications Prior to Visit  Medication Sig Dispense Refill  . allopurinol (ZYLOPRIM) 100 MG tablet TAKE 1 TABLET (100 MG TOTAL) BY MOUTH DAILY. 30 tablet 3  . aspirin EC 81 MG tablet Take 81 mg by mouth daily.    Marland Kitchen atorvastatin (LIPITOR) 10 MG tablet TAKE 1 TABLET (10 MG TOTAL) BY MOUTH EVERY OTHER DAY. 45 tablet 0  . carvedilol (COREG) 3.125 MG tablet Take 1 tablet (3.125 mg total) by mouth 2 (two) times daily. 180 tablet 3  . diphenhydramine-acetaminophen (TYLENOL PM) 25-500 MG TABS Take 1 tablet by mouth at bedtime as needed. For insomnia    . famotidine (PEPCID) 20 MG tablet Take 20 mg by mouth at bedtime as needed for heartburn or indigestion.     . furosemide (LASIX) 40 MG tablet Take 1 tablet (40 mg total) by mouth daily. 35 tablet 3  . glucosamine-chondroitin 500-400 MG tablet Take 1 tablet by mouth 2 (two) times a week.     . hyoscyamine (LEVSIN SL) 0.125 MG SL tablet Place 1 tablet (0.125 mg total) under the tongue every 4 (four) hours as needed. (Patient taking differently: Place 0.125 mg under the tongue every 4 (four) hours as needed for cramping. ) 30 tablet 1  . meclizine (ANTIVERT) 12.5 MG tablet Take 1 tablet (12.5 mg total) by mouth 3 (three) times daily as needed for dizziness. As needed for   Dizziness or nausea 30 tablet 1  . omeprazole (PRILOSEC OTC) 20 MG tablet Take 20 mg by mouth daily as needed (acid reflux).     . Vitamin D, Ergocalciferol, (DRISDOL) 50000 units CAPS capsule TAKE 1 CAPSULE (50,000 UNITS TOTAL) BY MOUTH EVERY 7 (SEVEN) DAYS. TAKE FOR 12 WEEKS 4 capsule 4   No facility-administered  medications prior to visit.      Allergies:   Patient has no known allergies.   Social History   Social History  . Marital status: Married    Spouse name: N/A  . Number of children: N/A  . Years of education: N/A   Social History Main Topics  . Smoking status: Former Smoker    Packs/day: 0.50    Years: 35.00    Types: Cigarettes    Quit date: 05/08/2006  . Smokeless tobacco: Never Used  . Alcohol use No  . Drug use: No  . Sexual activity: Not Asked     Comment: lives with husband, no dietary restrictions.    Other Topics Concern  . None   Social History Narrative  . None     Family History:  The patient's family history includes Asthma in her brother and daughter;  Heart failure in her father.   ROS:   Please see the history of present illness.    ROS All other systems reviewed and are negative.   PHYSICAL EXAM:   VS:  BP 128/78   Pulse 82   Ht 5\' 4"  (1.626 m)   Wt 220 lb (99.8 kg)   SpO2 96%   BMI 37.76 kg/m    GEN: Well nourished, well developed, in no acute distress  HEENT: normal  Neck: no JVD, carotid bruits, or masses Cardiac: RRR; no murmurs, rubs, or gallops,no edema  Respiratory:  clear to auscultation bilaterally, normal work of breathing GI: soft, nontender, nondistended, + BS MS: no deformity or atrophy  Skin: warm and dry, no rash Neuro:  Alert and Oriented x 3, Strength and sensation are intact Psych: euthymic mood, full affect  Wt Readings from Last 3 Encounters:  08/16/16 220 lb (99.8 kg)  07/27/16 228 lb (103.4 kg)  07/24/16 228 lb 12.8 oz (103.8 kg)      Studies/Labs Reviewed:   EKG:  EKG is not ordered today.    Recent Labs: 06/29/2016: ALT 64 07/24/2016: BNP 43.8; BUN 15; Creatinine, Ser 0.89; Hemoglobin 14.1; Platelets 261; Potassium 4.9; Sodium 141; TSH 2.650   Lipid Panel    Component Value Date/Time   CHOL 255 (H) 03/02/2016 0952   TRIG 353.0 (H) 03/02/2016 0952   HDL 52.10 03/02/2016 0952   CHOLHDL 5 03/02/2016 0952    VLDL 70.6 (H) 03/02/2016 0952   LDLCALC (H) 01/31/2007 0800    117        Total Cholesterol/HDL:CHD Risk Coronary Heart Disease Risk Table                     Men   Women  1/2 Average Risk   3.4   3.3  Average Risk       5.0   4.4  2 X Average Risk   9.6   7.1  3 X Average Risk  23.4   11.0        Use the calculated Patient Ratio above and the CHD Risk Table to determine the patient's CHD Risk.        ATP III CLASSIFICATION (LDL):  <100     mg/dL   Optimal  100-129  mg/dL   Near or Above                    Optimal  130-159  mg/dL   Borderline  160-189  mg/dL   High  >190     mg/dL   Very High   LDLDIRECT 143.0 03/02/2016 0952    Additional studies/ records that were reviewed today include:   Echo 07/12/2016 LV EF: 35% -   40%  Study Conclusions  - Left ventricle: The cavity size was normal. There was mild   concentric hypertrophy. Systolic function was moderately reduced.   The estimated ejection fraction was in the range of 35% to 40%.   Diffuse hypokinesis. There is akinesis of the anteroseptal   myocardium. Doppler parameters are consistent with abnormal left   ventricular relaxation (grade 1 diastolic dysfunction). - Mitral valve: There was mild regurgitation. - Left atrium: The atrium was mildly dilated. - Tricuspid valve: There was mild regurgitation. - Pulmonary arteries: Systolic pressure was mildly increased. PA   peak pressure: 36 mm Hg (S).  Impressions:  - When compared to prior, EF has decreased (prior 50%)    Cath 07/27/2016 Conclusion  Mid LAD lesion, 25 %stenosed.  There is moderate left ventricular systolic dysfunction.  LV end diastolic pressure is moderately elevated.  The left ventricular ejection fraction is 35-45% by visual estimate.  Hemodynamic findings consistent with mild pulmonary hypertension.     Nonischemic cardiomyopathy: EF appears more to be 35-45% with apical hypokinesis.  Angiographically normal coronary  arteries with a Circumflex dominant system.   Plan:  There is clearly evidence of combined systolic and diastolic heart failure: We will start Lasix. - 40 mg twice a day for 2 days then 40 mm daily until follow-up  Discharge after bedrest      ASSESSMENT:    1. NICM (nonischemic cardiomyopathy) ([place])   2. Medication management   3. Hyperlipidemia, unspecified hyperlipidemia type   4. H/O renal cell cancer      PLAN:  In order of problems listed above:  1. NICM: Previous echocardiogram in 2008 showed EF 50%, recent echocardiogram showed EF 35%. Cardiac catheterization showed minimal coronary artery disease. She is currently on carvedilol 3.125 mg twice a day. Will add losartan 12.5 mg daily. Recently started on Lasix, appears to be euvolemic on physical exam. We'll check basic metabolic panel today and also in one week given initiation of losartan and her solitary kidney. I will bring her back in 3-4 weeks for heart failure medication titration, once completed, I will defer to Dr. Debara Pickett whether or not to repeat echocardiogram in 3 month.  2. Hyperlipidemia: Recent lab work reviewed, in February, her triglyceride was over 350, total cholesterol 250, will add fish oil 1000 mg twice a day. He is on very low-dose Lipitor every other day. This dose was not uptitrated as she had elevated LFT on previous lab work. She is due for repeat lipid panel and LFT next month.  3. History of renal cell cancer s/p nephrectomy: Given recent aggressive diuresis and initiation of losartan, will need to monitor renal function closely. Basic metabolic panel today and also in 1 week.    Medication Adjustments/Labs and Tests Ordered: Current medicines are reviewed at length with the patient today.  Concerns regarding medicines are outlined above.  Medication changes, Labs and Tests ordered today are listed in the Patient Instructions below. Patient Instructions  Medication Instructions:  START LOSARTAN  12.5MG  (1/2 TABLET) DAILY START OTC FISH OIL 1,000MG  TWICE DAILY  If you need a refill on your cardiac medications before your next appointment, please call your pharmacy.  Labwork: BMP TODAY AND IN ONE WEEK(~08-23-2016) HERE IN OUR OFFICE AT [facility]  Follow-Up: Your physician wants you to follow-up in: 3-4 WEEKS WITH Dilcia Rybarczyk, PA-C AND IN 3 MONTHS WITH DR HILTY.    Thank you for choosing [facility] at [employer], [place]  08/16/2016 10:17 AM    [address], [place], [place]  [address] Phone: [phone]; Fax: [phone]

## 2016-08-16 ENCOUNTER — Ambulatory Visit (INDEPENDENT_AMBULATORY_CARE_PROVIDER_SITE_OTHER): Payer: Medicare HMO | Admitting: Physician Assistant

## 2016-08-16 ENCOUNTER — Encounter: Payer: Self-pay | Admitting: Physician Assistant

## 2016-08-16 VITALS — BP 128/78 | HR 82 | Ht 64.0 in | Wt 220.0 lb

## 2016-08-16 DIAGNOSIS — Z85528 Personal history of other malignant neoplasm of kidney: Secondary | ICD-10-CM

## 2016-08-16 DIAGNOSIS — I428 Other cardiomyopathies: Secondary | ICD-10-CM

## 2016-08-16 DIAGNOSIS — Z79899 Other long term (current) drug therapy: Secondary | ICD-10-CM | POA: Diagnosis not present

## 2016-08-16 DIAGNOSIS — E785 Hyperlipidemia, unspecified: Secondary | ICD-10-CM | POA: Diagnosis not present

## 2016-08-16 LAB — BASIC METABOLIC PANEL
BUN / CREAT RATIO: 14 (ref 12–28)
BUN: 16 mg/dL (ref 8–27)
CHLORIDE: 101 mmol/L (ref 96–106)
CO2: 22 mmol/L (ref 20–29)
Calcium: 10.7 mg/dL — ABNORMAL HIGH (ref 8.7–10.3)
Creatinine, Ser: 1.15 mg/dL — ABNORMAL HIGH (ref 0.57–1.00)
GFR calc non Af Amer: 47 mL/min/{1.73_m2} — ABNORMAL LOW (ref >59)
GFR, EST AFRICAN AMERICAN: 55 mL/min/{1.73_m2} — AB (ref >59)
GLUCOSE: 143 mg/dL — AB (ref 65–99)
Potassium: 4.6 mmol/L (ref 3.5–5.2)
SODIUM: 141 mmol/L (ref 134–144)

## 2016-08-16 MED ORDER — FISH OIL 1000 MG PO CAPS: 1000.0000 mg | ORAL_CAPSULE | Freq: Two times a day (BID) | ORAL | 0 refills | Status: DC

## 2016-08-16 MED ORDER — LOSARTAN POTASSIUM 25 MG PO TABS: 12.5000 mg | ORAL_TABLET | Freq: Every day | ORAL | 2 refills | Status: DC

## 2016-08-16 NOTE — Patient Instructions (Signed)
Medication Instructions:  START LOSARTAN 12.5MG  (1/2 TABLET) DAILY START OTC FISH OIL 1,000MG  TWICE DAILY  If you need a refill on your cardiac medications before your next appointment, please call your pharmacy.  Labwork: BMP TODAY AND IN ONE WEEK(~08-23-2016) HERE IN OUR OFFICE AT [facility]  Follow-Up: Your physician wants you to follow-up in: 3-4 WEEKS WITH HAO MENG, PA-C AND IN 3 MONTHS WITH DR HILTY.    Thank you for choosing [facility] at [facility]!!

## 2016-08-23 DIAGNOSIS — Z79899 Other long term (current) drug therapy: Secondary | ICD-10-CM | POA: Diagnosis not present

## 2016-08-23 LAB — BASIC METABOLIC PANEL
BUN/Creatinine Ratio: 15 (ref 12–28)
BUN: 17 mg/dL (ref 8–27)
CALCIUM: 10.4 mg/dL — AB (ref 8.7–10.3)
CO2: 21 mmol/L (ref 20–29)
CREATININE: 1.15 mg/dL — AB (ref 0.57–1.00)
Chloride: 98 mmol/L (ref 96–106)
GFR, EST AFRICAN AMERICAN: 55 mL/min/{1.73_m2} — AB (ref >59)
GFR, EST NON AFRICAN AMERICAN: 47 mL/min/{1.73_m2} — AB (ref >59)
Glucose: 174 mg/dL — ABNORMAL HIGH (ref 65–99)
POTASSIUM: 4.4 mmol/L (ref 3.5–5.2)
Sodium: 138 mmol/L (ref 134–144)

## 2016-08-23 NOTE — Telephone Encounter (Signed)
Patient saw Amanda Crawford, [place] 08/16/16

## 2016-08-23 NOTE — Progress Notes (Signed)
Renal function and electrolyte stable.

## 2016-08-24 ENCOUNTER — Telehealth: Payer: Self-pay | Admitting: Family Medicine

## 2016-08-24 DIAGNOSIS — R350 Frequency of micturition: Secondary | ICD-10-CM

## 2016-08-24 DIAGNOSIS — R300 Dysuria: Secondary | ICD-10-CM

## 2016-08-24 NOTE — Telephone Encounter (Signed)
Pt called in, she said that she discussed with provider at apt that she may have a UTI because she is having symptoms. Pt would like to know if she can come in to give a UA and get some antibiotics to clear things up?    Please advise.    CB: [phone]

## 2016-08-25 ENCOUNTER — Other Ambulatory Visit (INDEPENDENT_AMBULATORY_CARE_PROVIDER_SITE_OTHER): Payer: Medicare HMO

## 2016-08-25 DIAGNOSIS — R350 Frequency of micturition: Secondary | ICD-10-CM

## 2016-08-25 DIAGNOSIS — R300 Dysuria: Secondary | ICD-10-CM

## 2016-08-25 LAB — URINALYSIS
Bilirubin Urine: NEGATIVE
Hgb urine dipstick: NEGATIVE
Ketones, ur: NEGATIVE
Leukocytes, UA: NEGATIVE
NITRITE: NEGATIVE
PH: 6 (ref 5.0–8.0)
Specific Gravity, Urine: 1.015 (ref 1.000–1.030)
TOTAL PROTEIN, URINE-UPE24: NEGATIVE
URINE GLUCOSE: NEGATIVE
Urobilinogen, UA: 0.2 (ref 0.0–1.0)

## 2016-08-25 NOTE — Telephone Encounter (Signed)
Please advise    PC 

## 2016-08-25 NOTE — Telephone Encounter (Signed)
Have her come in for UA and c and s then we can send in rx. Need to know symptoms

## 2016-08-25 NOTE — Telephone Encounter (Signed)
Called patient, she stated she was having frequency and burning. I have placed order for UA and culture. She stated she will come in today to have it done.   PC

## 2016-08-28 ENCOUNTER — Other Ambulatory Visit: Payer: Self-pay | Admitting: Family Medicine

## 2016-08-28 LAB — URINE CULTURE

## 2016-08-28 MED ORDER — CEFDINIR 300 MG PO CAPS: 300.0000 mg | ORAL_CAPSULE | Freq: Two times a day (BID) | ORAL | 0 refills | Status: DC

## 2016-09-13 ENCOUNTER — Encounter: Payer: Self-pay | Admitting: Physician Assistant

## 2016-09-13 ENCOUNTER — Ambulatory Visit (INDEPENDENT_AMBULATORY_CARE_PROVIDER_SITE_OTHER): Payer: Medicare HMO | Admitting: Physician Assistant

## 2016-09-13 VITALS — BP 114/70 | HR 85 | Ht 64.0 in | Wt 221.8 lb

## 2016-09-13 DIAGNOSIS — I428 Other cardiomyopathies: Secondary | ICD-10-CM | POA: Diagnosis not present

## 2016-09-13 DIAGNOSIS — C649 Malignant neoplasm of unspecified kidney, except renal pelvis: Secondary | ICD-10-CM | POA: Diagnosis not present

## 2016-09-13 DIAGNOSIS — Z79899 Other long term (current) drug therapy: Secondary | ICD-10-CM

## 2016-09-13 DIAGNOSIS — I2510 Atherosclerotic heart disease of native coronary artery without angina pectoris: Secondary | ICD-10-CM | POA: Diagnosis not present

## 2016-09-13 DIAGNOSIS — I447 Left bundle-branch block, unspecified: Secondary | ICD-10-CM

## 2016-09-13 DIAGNOSIS — E785 Hyperlipidemia, unspecified: Secondary | ICD-10-CM

## 2016-09-13 MED ORDER — CARVEDILOL 6.25 MG PO TABS: 6.2500 mg | ORAL_TABLET | Freq: Two times a day (BID) | ORAL | 1 refills | Status: DC

## 2016-09-13 NOTE — Progress Notes (Signed)
Cardiology Office Note    Date:  09/14/2016   ID:  Amanda Crawford, DOB [birth year], MRN [id number]  PCP:  [name], MD  Cardiologist:  Dr. Debara Pickett   Chief Complaint  Patient presents with  . Follow-up    pt denied chest pain, pt c/o coughing for the past 10 days    History of Present Illness:  Amanda Crawford is a 71 y.o. female with PMH of hyperlipidemia and history of renal cell cancer who was recently evaluated for shortness of breath. She has a known history of left bundle branch block dating back to 2008. She notice occasional chest discomfort radiating to the back. She had a chronic catheterization in January 2009 which indicated normal coronaries. She later underwent nephrectomy for renal cell carcinoma and ultimately found to have recurrent metastatic renal disease to the lung and require to partial lobectomy. More recently, she was having more difficulty carryout long conversations. Echocardiogram ordered by primary care physician on 07/12/2016 demonstrated newly decreased ejection fraction of 35-40% from previous 50%, LVH, mild diastolic dysfunction as well as mild mitral regurgitation.  Due to new onset of cardiomyopathy and progressive dyspnea on exertion, left and right heart cath was recommended. Patient underwent the scheduled procedure on 07/27/2016, and this showed 25% mid LAD lesion, 35-45% EF, moderate LV systolic dysfunction, moderately elevated LVEDP, hemodynamic consistent with mild pulmonary hypertension. Mean wedge pressure was 20 mmHg, PA pressure 34/16/25. Cardiac output 4.93 L/min, cardiac index 2.39. She was started on 40 mg daily of Lasix. I last saw the patient on 08/16/2016, I added very low-dose losartan. One week repeat basic metabolic panel shows stable renal function. She was later diagnosed to have urinary tract infection and was treated with a 7 day course of antibiotic.  Patient presents today for cardiology office visit. She denies any significant  discomfort, lower extremity edema, orthopnea or paroxysmal nocturnal dyspnea. Weight is stable compared to the last visit. She has not noticed much difference after starting on fish oil. We will obtain a fasting lipid panel near the end of this month. I also plan to obtain a CMP to make sure her renal function remaining stable and her liver function has improved compared to May. Otherwise, her blood pressure is stable, I increased her carvedilol to 6.25 mg twice a day. This should complete her heart failure medication regimen, I will defer to Dr. Debara Pickett to decide when to repeat echocardiogram.   Past Medical History:  Diagnosis Date  . Allergic state 03/12/2015  . Arthritis   . Colon polyp 11/19/2014  . Dermatitis 03/12/2015  . Diarrhea 06/29/2016  . GERD (gastroesophageal reflux disease)    occ  . Gout 03/12/2015  . H/O measles   . H/O mumps   . Headache(784.0)    migraines  . History of chicken pox 11/23/2014  . Hyperglycemia 06/29/2016  . Lung cancer ([place])   . Migraine 11/23/2014  . Obesity 11/23/2014  . Pneumonia    child  . Preventative health care 09/12/2015  . Renal cell cancer ([place])    renal cell ca dx 9/01 and 8/08;  . Shortness of breath    occ  . Skin lesion of breast 03/12/2015    Past Surgical History:  Procedure Laterality Date  . ABDOMINAL HYSTERECTOMY    . APPENDECTOMY    . CARDIAC CATHETERIZATION     yrs ago neg  . CHOLECYSTECTOMY    . LUNG CANCER SURGERY Left 08  . RENAL MASS EXCISION Left  01  . RIGHT/LEFT HEART CATH AND CORONARY ANGIOGRAPHY N/A 07/27/2016   Procedure: Right/Left Heart Cath and Coronary Angiography;  Surgeon: Leonie Man, MD;  Location: [facility];  Service: Cardiovascular;  Laterality: N/A;  . THORACOTOMY Left 05/09/2012   Procedure: THORACOTOMY MAJOR;  Surgeon: Gaye Pollack, MD;  Location: [facility];  Service: Thoracic;  Laterality: Left;  Marland Kitchen VIDEO BRONCHOSCOPY N/A 05/09/2012   Procedure: VIDEO BRONCHOSCOPY;  Surgeon: Gaye Pollack,  MD;  Location: [facility];  Service: Thoracic;  Laterality: N/A;  . WEDGE RESECTION Left 05/09/2012   Procedure: LEFT UPPER LOBE WEDGE RESECTION;  Surgeon: Gaye Pollack, MD;  Location: [facility] OR;  Service: Thoracic;  Laterality: Left;    Current Medications: Outpatient Medications Prior to Visit  Medication Sig Dispense Refill  . allopurinol (ZYLOPRIM) 100 MG tablet TAKE 1 TABLET (100 MG TOTAL) BY MOUTH DAILY. 30 tablet 3  . aspirin EC 81 MG tablet Take 81 mg by mouth daily.    Marland Kitchen atorvastatin (LIPITOR) 10 MG tablet TAKE 1 TABLET (10 MG TOTAL) BY MOUTH EVERY OTHER DAY. 45 tablet 0  . diphenhydramine-acetaminophen (TYLENOL PM) 25-500 MG TABS Take 1 tablet by mouth at bedtime as needed. For insomnia    . famotidine (PEPCID) 20 MG tablet Take 20 mg by mouth at bedtime as needed for heartburn or indigestion.     . furosemide (LASIX) 40 MG tablet Take 1 tablet (40 mg total) by mouth daily. 35 tablet 3  . glucosamine-chondroitin 500-400 MG tablet Take 1 tablet by mouth 2 (two) times a week.     . hyoscyamine (LEVSIN SL) 0.125 MG SL tablet Place 1 tablet (0.125 mg total) under the tongue every 4 (four) hours as needed. (Patient taking differently: Place 0.125 mg under the tongue every 4 (four) hours as needed for cramping. ) 30 tablet 1  . losartan (COZAAR) 25 MG tablet Take 0.5 tablets (12.5 mg total) by mouth daily. 15 tablet 2  . meclizine (ANTIVERT) 12.5 MG tablet Take 1 tablet (12.5 mg total) by mouth 3 (three) times daily as needed for dizziness. As needed for   Dizziness or nausea 30 tablet 1  . Omega-3 Fatty Acids (FISH OIL) 1000 MG CAPS Take 1 capsule (1,000 mg total) by mouth 2 (two) times daily.  0  . omeprazole (PRILOSEC OTC) 20 MG tablet Take 20 mg by mouth daily as needed (acid reflux).     . Vitamin D, Ergocalciferol, (DRISDOL) 50000 units CAPS capsule TAKE 1 CAPSULE (50,000 UNITS TOTAL) BY MOUTH EVERY 7 (SEVEN) DAYS. TAKE FOR 12 WEEKS 4 capsule 4  . carvedilol (COREG) 3.125 MG tablet Take 1  tablet (3.125 mg total) by mouth 2 (two) times daily. 180 tablet 3  . cefdinir (OMNICEF) 300 MG capsule Take 1 capsule (300 mg total) by mouth 2 (two) times daily. Take for 7 days 14 capsule 0   No facility-administered medications prior to visit.      Allergies:   Patient has no known allergies.   Social History   Social History  . Marital status: Married    Spouse name: N/A  . Number of children: N/A  . Years of education: N/A   Social History Main Topics  . Smoking status: Former Smoker    Packs/day: 0.50    Years: 35.00    Types: Cigarettes    Quit date: 05/08/2006  . Smokeless tobacco: Never Used  . Alcohol use No  . Drug use: No  . Sexual activity:  Not Asked     Comment: lives with husband, no dietary restrictions.    Other Topics Concern  . None   Social History Narrative  . None     Family History:  The patient's family history includes Asthma in her brother and daughter; Heart failure in her father.   ROS:   Please see the history of present illness.    ROS All other systems reviewed and are negative.   PHYSICAL EXAM:   VS:  BP 114/70   Pulse 85   Ht 5\' 4"  (1.626 m)   Wt 221 lb 12.8 oz (100.6 kg)   BMI 38.07 kg/m    GEN: Well nourished, well developed, in no acute distress  HEENT: normal  Neck: no JVD, carotid bruits, or masses Cardiac: RRR; no murmurs, rubs, or gallops,no edema  Respiratory:  clear to auscultation bilaterally, normal work of breathing GI: soft, nontender, nondistended, + BS MS: no deformity or atrophy  Skin: warm and dry, no rash Neuro:  Alert and Oriented x 3, Strength and sensation are intact Psych: euthymic mood, full affect  Wt Readings from Last 3 Encounters:  09/13/16 221 lb 12.8 oz (100.6 kg)  08/16/16 220 lb (99.8 kg)  07/27/16 228 lb (103.4 kg)      Studies/Labs Reviewed:   EKG:  EKG is not ordered today.   Recent Labs: 06/29/2016: ALT 64 07/24/2016: BNP 43.8; Hemoglobin 14.1; Platelets 261; TSH 2.650 08/23/2016:  BUN 17; Creatinine, Ser 1.15; Potassium 4.4; Sodium 138   Lipid Panel    Component Value Date/Time   CHOL 255 (H) 03/02/2016 0952   TRIG 353.0 (H) 03/02/2016 0952   HDL 52.10 03/02/2016 0952   CHOLHDL 5 03/02/2016 0952   VLDL 70.6 (H) 03/02/2016 0952   LDLCALC (H) 01/31/2007 0800    117        Total Cholesterol/HDL:CHD Risk Coronary Heart Disease Risk Table                     Men   Women  1/2 Average Risk   3.4   3.3  Average Risk       5.0   4.4  2 X Average Risk   9.6   7.1  3 X Average Risk  23.4   11.0        Use the calculated Patient Ratio above and the CHD Risk Table to determine the patient's CHD Risk.        ATP III CLASSIFICATION (LDL):  <100     mg/dL   Optimal  100-129  mg/dL   Near or Above                    Optimal  130-159  mg/dL   Borderline  160-189  mg/dL   High  >190     mg/dL   Very High   LDLDIRECT 143.0 03/02/2016 0952    Additional studies/ records that were reviewed today include:   Echo 07/12/2016 LV EF: 35% - 40%  Study Conclusions  - Left ventricle: The cavity size was normal. There was mild concentric hypertrophy. Systolic function was moderately reduced. The estimated ejection fraction was in the range of 35% to 40%. Diffuse hypokinesis. There is akinesis of the anteroseptal myocardium. Doppler parameters are consistent with abnormal left ventricular relaxation (grade 1 diastolic dysfunction). - Mitral valve: There was mild regurgitation. - Left atrium: The atrium was mildly dilated. - Tricuspid valve: There was mild regurgitation. - Pulmonary arteries:  Systolic pressure was mildly increased. PA peak pressure: 36 mm Hg (S).  Impressions:  - When compared to prior, EF has decreased (prior 50%)    Cath 07/27/2016 Conclusion     Mid LAD lesion, 25 %stenosed.  There is moderate left ventricular systolic dysfunction.  LV end diastolic pressure is moderately elevated.  The left ventricular ejection  fraction is 35-45% by visual estimate.  Hemodynamic findings consistent with mild pulmonary hypertension.    Nonischemic cardiomyopathy: EF appears more to be 35-45% with apical hypokinesis.  Angiographically normal coronary arteries with a Circumflex dominant system.   Plan:  There is clearly evidence of combined systolic and diastolic heart failure: We will start Lasix. - 40 mg twice a day for 2 days then 40 mm daily until follow-up  Discharge after bedrest      ASSESSMENT:    1. Nonischemic cardiomyopathy ([place])   2. Hyperlipidemia, unspecified hyperlipidemia type   3. Encounter for long-term (current) use of medications   4. LBBB (left bundle branch block)   5. Malignant neoplasm of kidney, unspecified laterality ([place])   6. Coronary artery disease involving native coronary artery of native heart without angina pectoris      PLAN:  In order of problems listed above:  1. Nonischemic cardiomyopathy: Previous echocardiogram in 2008 showed EF 50%, recent echocardiogram showed EF 35%. Minimal disease on cardiac catheterization. I will increase carvedilol to 6.25 mg twice a day. Continue losartan 12.5 mg daily. I will defer to Dr. Debara Pickett to decide when to repeat echocardiogram  2. Hyperlipidemia: Plan to obtain CMP and fasting lipid panel in 2 weeks. I added Fish oil during the last office visit  3. History of renal cell cancer s/p nephrectomy: Renal function stable on recent lab work.    Medication Adjustments/Labs and Tests Ordered: Current medicines are reviewed at length with the patient today.  Concerns regarding medicines are outlined above.  Medication changes, Labs and Tests ordered today are listed in the Patient Instructions below. Patient Instructions  Medication Instructions:   INCREASE Carvedilol to 6.25mg  TWICE DAILY (a prescription has been sent to your pharmacy)  Labwork:   CMET, lipid profile in 2  weeks  Testing/Procedures:  none  Follow-Up:  Keep visit with Dr. Debara Pickett as scheduled.   If you need a refill on your cardiac medications before your next appointment, please call your pharmacy.      Hilbert Corrigan, [place]  09/14/2016 10:57 PM    [facility] [address], [place], [place]  [address] Phone: [phone]; Fax: [phone]

## 2016-09-13 NOTE — Patient Instructions (Addendum)
Medication Instructions:   INCREASE Carvedilol to 6.25mg  TWICE DAILY (a prescription has been sent to your pharmacy)  Labwork:   CMET, lipid profile in 2 weeks  Testing/Procedures:  none  Follow-Up:  Keep visit with Dr. Debara Pickett as scheduled.   If you need a refill on your cardiac medications before your next appointment, please call your pharmacy.

## 2016-09-14 ENCOUNTER — Encounter: Payer: Self-pay | Admitting: Physician Assistant

## 2016-09-24 ENCOUNTER — Other Ambulatory Visit: Payer: Self-pay | Admitting: Family Medicine

## 2016-10-03 ENCOUNTER — Encounter: Payer: Self-pay | Admitting: Family Medicine

## 2016-10-03 DIAGNOSIS — K5730 Diverticulosis of large intestine without perforation or abscess without bleeding: Secondary | ICD-10-CM | POA: Diagnosis not present

## 2016-10-03 DIAGNOSIS — D123 Benign neoplasm of transverse colon: Secondary | ICD-10-CM | POA: Diagnosis not present

## 2016-10-03 DIAGNOSIS — Z86010 Personal history of colonic polyps: Secondary | ICD-10-CM | POA: Diagnosis not present

## 2016-10-03 LAB — HM COLONOSCOPY

## 2016-10-09 ENCOUNTER — Encounter: Payer: Self-pay | Admitting: Family Medicine

## 2016-10-09 NOTE — Progress Notes (Signed)
09/40/ 

## 2016-10-20 ENCOUNTER — Ambulatory Visit: Payer: Medicare HMO | Admitting: Family Medicine

## 2016-10-23 ENCOUNTER — Encounter: Payer: Self-pay | Admitting: Family Medicine

## 2016-10-23 ENCOUNTER — Ambulatory Visit (INDEPENDENT_AMBULATORY_CARE_PROVIDER_SITE_OTHER): Payer: Medicare HMO | Admitting: Family Medicine

## 2016-10-23 VITALS — BP 122/60 | HR 79 | Temp 98.4°F | Resp 18 | Wt 223.0 lb

## 2016-10-23 DIAGNOSIS — R7989 Other specified abnormal findings of blood chemistry: Secondary | ICD-10-CM

## 2016-10-23 DIAGNOSIS — E119 Type 2 diabetes mellitus without complications: Secondary | ICD-10-CM | POA: Diagnosis not present

## 2016-10-23 DIAGNOSIS — Z23 Encounter for immunization: Secondary | ICD-10-CM | POA: Diagnosis not present

## 2016-10-23 DIAGNOSIS — M1A9XX0 Chronic gout, unspecified, without tophus (tophi): Secondary | ICD-10-CM

## 2016-10-23 DIAGNOSIS — N3000 Acute cystitis without hematuria: Secondary | ICD-10-CM | POA: Diagnosis not present

## 2016-10-23 DIAGNOSIS — R739 Hyperglycemia, unspecified: Secondary | ICD-10-CM

## 2016-10-23 DIAGNOSIS — M8580 Other specified disorders of bone density and structure, unspecified site: Secondary | ICD-10-CM | POA: Diagnosis not present

## 2016-10-23 DIAGNOSIS — R300 Dysuria: Secondary | ICD-10-CM

## 2016-10-23 DIAGNOSIS — I420 Dilated cardiomyopathy: Secondary | ICD-10-CM | POA: Diagnosis not present

## 2016-10-23 DIAGNOSIS — E785 Hyperlipidemia, unspecified: Secondary | ICD-10-CM

## 2016-10-23 MED ORDER — ALLOPURINOL 100 MG PO TABS: 200.0000 mg | ORAL_TABLET | Freq: Every day | ORAL | 3 refills | Status: DC

## 2016-10-23 MED ORDER — COLCHICINE 0.6 MG PO TABS: ORAL_TABLET | ORAL | 2 refills | Status: DC

## 2016-10-23 NOTE — Assessment & Plan Note (Signed)
Encouraged DASH diet, decrease po intake and increase exercise as tolerated. Needs 7-8 hours of sleep nightly. Avoid trans fats, eat small, frequent meals every 4-5 hours with lean proteins, complex carbs and healthy fats. Minimize simple carbs, 

## 2016-10-23 NOTE — Assessment & Plan Note (Signed)
Encouraged heart healthy diet, increase exercise, avoid trans fats, consider a krill oil cap daily 

## 2016-10-23 NOTE — Assessment & Plan Note (Signed)
Encouraged DASH diet, decrease po intake and increase exercise as tolerated. Needs 7-8 hours of sleep nightly. Avoid trans fats, eat small, frequent meals every 4-5 hours with lean proteins, complex carbs and healthy fats. Minimize simple carbs, GMO foods. 

## 2016-10-23 NOTE — Assessment & Plan Note (Signed)
Right great toe bunion, flared and painful x 2 weeks. Encouraged to hydrate and avoid offending foods. Check uric acid level. Increase hydration and try topical treatments. Has used colchicine in past but the cost has gone up so has not picked up new colchicine prescription.

## 2016-10-23 NOTE — Assessment & Plan Note (Signed)
Follows with cardiology 

## 2016-10-23 NOTE — Assessment & Plan Note (Signed)
Labs reveal deficiency. Start on Vitamin D 50000 IU caps, 1 cap po weekly x 12 weeks.Also take daily Vitamin D over the counter. If already taking a daily supplement increase by 1000 IU daily and if not start Vitamin D 2000 IU daily.

## 2016-10-23 NOTE — Patient Instructions (Addendum)
Hydrate, topical aspercrema and Lidocaine gel twice daily   Shingrix is the new shingles shot 2 shots over 6 months,   Gout Gout is painful swelling that can occur in some of your joints. Gout is a type of arthritis. This condition is caused by having too much uric acid in your body. Uric acid is a chemical that forms when your body breaks down substances called purines. Purines are important for building body proteins. When your body has too much uric acid, sharp crystals can form and build up inside your joints. This causes pain and swelling. Gout attacks can happen quickly and be very painful (acute gout). Over time, the attacks can affect more joints and become more frequent (chronic gout). Gout can also cause uric acid to build up under your skin and inside your kidneys. What are the causes?Shingrix  This condition is caused by too much uric acid in your blood. This can occur because:  Your kidneys do not remove enough uric acid from your blood. This is the most common cause.  Your body makes too much uric acid. This can occur with some cancers and cancer treatments. It can also occur if your body is breaking down too many red blood cells (hemolytic anemia).  You eat too many foods that are high in purines. These foods include organ meats and some seafood. Alcohol, especially beer, is also high in purines.  A gout attack may be triggered by trauma or stress. What increases the risk? This condition is more likely to develop in people who:  Have a family history of gout.  Are female and middle-aged.  Are female and have gone through menopause.  Are obese.  Frequently drink alcohol, especially beer.  Are dehydrated.  Lose weight too quickly.  Have an organ transplant.  Have lead poisoning.  Take certain medicines, including aspirin, cyclosporine, diuretics, levodopa, and niacin.  Have kidney disease or psoriasis.  What are the signs or symptoms? An attack of acute gout  happens quickly. It usually occurs in just one joint. The most common place is the big toe. Attacks often start at night. Other joints that may be affected include joints of the feet, ankle, knee, fingers, wrist, or elbow. Symptoms may include:  Severe pain.  Warmth.  Swelling.  Stiffness.  Tenderness. The affected joint may be very painful to touch.  Shiny, red, or purple skin.  Chills and fever.  Chronic gout may cause symptoms more frequently. More joints may be involved. You may also have white or yellow lumps (tophi) on your hands or feet or in other areas near your joints. How is this diagnosed? This condition is diagnosed based on your symptoms, medical history, and physical exam. You may have tests, such as:  Blood tests to measure uric acid levels.  Removal of joint fluid with a needle (aspiration) to look for uric acid crystals.  X-rays to look for joint damage.  How is this treated? Treatment for this condition has two phases: treating an acute attack and preventing future attacks. Acute gout treatment may include medicines to reduce pain and swelling, including:  NSAIDs.  Steroids. These are strong anti-inflammatory medicines that can be taken by mouth (orally) or injected into a joint.  Colchicine. This medicine relieves pain and swelling when it is taken soon after an attack. It can be given orally or through an IV tube.  Preventive treatment may include:  Daily use of smaller doses of NSAIDs or colchicine.  Use of a medicine  that reduces uric acid levels in your blood.  Changes to your diet. You may need to see a specialist about healthy eating (dietitian).  Follow these instructions at home: During a Gout Attack  If directed, apply ice to the affected area: ? Put ice in a plastic bag. ? Place a towel between your skin and the bag. ? Leave the ice on for 20 minutes, 2-3 times a day.  Rest the joint as much as possible. If the affected joint is in your  leg, you may be given crutches to use.  Raise (elevate) the affected joint above the level of your heart as often as possible.  Drink enough fluids to keep your urine clear or pale yellow.  Take over-the-counter and prescription medicines only as told by your health care provider.  Do not drive or operate heavy machinery while taking prescription pain medicine.  Follow instructions from your health care provider about eating or drinking restrictions.  Return to your normal activities as told by your health care provider. Ask your health care provider what activities are safe for you. Avoiding Future Gout Attacks  Follow a low-purine diet as told by your dietitian or health care provider. Avoid foods and drinks that are high in purines, including liver, kidney, anchovies, asparagus, herring, mushrooms, mussels, and beer.  Limit alcohol intake to no more than 1 drink a day for nonpregnant women and 2 drinks a day for men. One drink equals 12 oz of beer, 5 oz of wine, or 1 oz of hard liquor.  Maintain a healthy weight or lose weight if you are overweight. If you want to lose weight, talk with your health care provider. It is important that you do not lose weight too quickly.  Start or maintain an exercise program as told by your health care provider.  Drink enough fluids to keep your urine clear or pale yellow.  Take over-the-counter and prescription medicines only as told by your health care provider.  Keep all follow-up visits as told by your health care provider. This is important. Contact a health care provider if:  You have another gout attack.  You continue to have symptoms of a gout attack after10 days of treatment.  You have side effects from your medicines.  You have chills or a fever.  You have burning pain when you urinate.  You have pain in your lower back or belly. Get help right away if:  You have severe or uncontrolled pain.  You cannot urinate. This  information is not intended to replace advice given to you by your health care provider. Make sure you discuss any questions you have with your health care provider. Document Released: 01/14/2000 Document Revised: 06/24/2015 Document Reviewed: 10/29/2014 Elsevier Interactive Patient Education  2017 [employer].

## 2016-10-23 NOTE — Progress Notes (Signed)
Subjective:  I acted as a Education administrator for Dr. Charlett Blake. [place], [place]  Patient ID: COLA GANE, female    DOB: 11/08/1945, 71 y.o.   MRN: [id number]  No chief complaint on file.   HPI  Patient is in today for a 4 month follow up. She notes a recent flare in gout. She had a bad flare of pain in her right foot pain about 2 weeks ago. Has resolved now. No recent febrile illness or hospitalizations. No polyuria or polydipsia. Denies CP/palp/SOB/HA/congestion/fevers/GI c/o. Taking meds as prescribed. Has noted some dysuria and urinary urgency. No hematuria  Patient Care Team: [name], MD as PCP - General (Family Medicine) Wyatt Portela, MD (Hematology and Oncology) Gaye Pollack, MD as Attending Physician (Cardiothoracic Surgery)   Past Medical History:  Diagnosis Date  . Allergic state 03/12/2015  . Arthritis   . Colon polyp 11/19/2014  . Dermatitis 03/12/2015  . Diarrhea 06/29/2016  . DM (diabetes mellitus), type 2 ([place]) 06/29/2016  . GERD (gastroesophageal reflux disease)    occ  . Gout 03/12/2015  . H/O measles   . H/O mumps   . Headache(784.0)    migraines  . History of chicken pox 11/23/2014  . Hyperglycemia 06/29/2016  . Lung cancer ([place])   . Migraine 11/23/2014  . Obesity 11/23/2014  . Pneumonia    child  . Preventative health care 09/12/2015  . Renal cell cancer ([place])    renal cell ca dx 9/01 and 8/08;  . Shortness of breath    occ  . Skin lesion of breast 03/12/2015    Past Surgical History:  Procedure Laterality Date  . ABDOMINAL HYSTERECTOMY    . APPENDECTOMY    . CARDIAC CATHETERIZATION     yrs ago neg  . CHOLECYSTECTOMY    . LUNG CANCER SURGERY Left 08  . RENAL MASS EXCISION Left 01  . RIGHT/LEFT HEART CATH AND CORONARY ANGIOGRAPHY N/A 07/27/2016   Procedure: Right/Left Heart Cath and Coronary Angiography;  Surgeon: Leonie Man, MD;  Location: [facility];  Service: Cardiovascular;  Laterality: N/A;  . THORACOTOMY Left 05/09/2012   Procedure: THORACOTOMY MAJOR;  Surgeon: Gaye Pollack, MD;  Location: [facility];  Service: Thoracic;  Laterality: Left;  Marland Kitchen VIDEO BRONCHOSCOPY N/A 05/09/2012   Procedure: VIDEO BRONCHOSCOPY;  Surgeon: Gaye Pollack, MD;  Location: [facility];  Service: Thoracic;  Laterality: N/A;  . WEDGE RESECTION Left 05/09/2012   Procedure: LEFT UPPER LOBE WEDGE RESECTION;  Surgeon: Gaye Pollack, MD;  Location: [facility];  Service: Thoracic;  Laterality: Left;    Family History  Problem Relation Age of Onset  . Heart failure Father   . Asthma Brother   . Asthma Daughter     Social History   Social History  . Marital status: Married    Spouse name: N/A  . Number of children: N/A  . Years of education: N/A   Occupational History  . Not on file.   Social History Main Topics  . Smoking status: Former Smoker    Packs/day: 0.50    Years: 35.00    Types: Cigarettes    Quit date: 05/08/2006  . Smokeless tobacco: Never Used  . Alcohol use No  . Drug use: No  . Sexual activity: Not on file     Comment: lives with husband, no dietary restrictions.    Other Topics Concern  . Not on file   Social History Narrative  . No narrative on  file    Outpatient Medications Prior to Visit  Medication Sig Dispense Refill  . aspirin EC 81 MG tablet Take 81 mg by mouth daily.    Marland Kitchen atorvastatin (LIPITOR) 10 MG tablet Take 1 tablet (10 mg total) by mouth every other day. 45 tablet 0  . carvedilol (COREG) 6.25 MG tablet Take 1 tablet (6.25 mg total) by mouth 2 (two) times daily. 180 tablet 1  . diphenhydramine-acetaminophen (TYLENOL PM) 25-500 MG TABS Take 1 tablet by mouth at bedtime as needed. For insomnia    . famotidine (PEPCID) 20 MG tablet Take 20 mg by mouth at bedtime as needed for heartburn or indigestion.     . furosemide (LASIX) 40 MG tablet Take 1 tablet (40 mg total) by mouth daily. 35 tablet 3  . glucosamine-chondroitin 500-400 MG tablet Take 1 tablet by mouth 2 (two) times a week.     . hyoscyamine (LEVSIN  SL) 0.125 MG SL tablet Place 1 tablet (0.125 mg total) under the tongue every 4 (four) hours as needed. (Patient taking differently: Place 0.125 mg under the tongue every 4 (four) hours as needed for cramping. ) 30 tablet 1  . losartan (COZAAR) 25 MG tablet Take 0.5 tablets (12.5 mg total) by mouth daily. 15 tablet 2  . meclizine (ANTIVERT) 12.5 MG tablet Take 1 tablet (12.5 mg total) by mouth 3 (three) times daily as needed for dizziness. As needed for   Dizziness or nausea 30 tablet 1  . Omega-3 Fatty Acids (FISH OIL) 1000 MG CAPS Take 1 capsule (1,000 mg total) by mouth 2 (two) times daily.  0  . omeprazole (PRILOSEC OTC) 20 MG tablet Take 20 mg by mouth daily as needed (acid reflux).     . Vitamin D, Ergocalciferol, (DRISDOL) 50000 units CAPS capsule TAKE 1 CAPSULE (50,000 UNITS TOTAL) BY MOUTH EVERY 7 (SEVEN) DAYS. TAKE FOR 12 WEEKS 4 capsule 4  . allopurinol (ZYLOPRIM) 100 MG tablet TAKE 1 TABLET (100 MG TOTAL) BY MOUTH DAILY. 30 tablet 3   No facility-administered medications prior to visit.     No Known Allergies  Review of Systems  Constitutional: Negative for fever and malaise/fatigue.  HENT: Negative for congestion.   Eyes: Negative for blurred vision.  Respiratory: Negative for cough and shortness of breath.   Cardiovascular: Negative for chest pain, palpitations and leg swelling.  Gastrointestinal: Negative for vomiting.  Genitourinary: Positive for dysuria and frequency.  Musculoskeletal: Positive for joint pain. Negative for back pain.  Skin: Negative for rash.  Neurological: Negative for tremors, loss of consciousness and headaches.       Objective:    Physical Exam  Constitutional: She is oriented to person, place, and time. She appears well-developed and well-nourished. No distress.  HENT:  Head: Normocephalic and atraumatic.  Eyes: Conjunctivae are normal.  Neck: Normal range of motion. No thyromegaly present.  Cardiovascular: Normal rate and regular rhythm.     Pulmonary/Chest: Effort normal and breath sounds normal. She has no wheezes.  Abdominal: Soft. Bowel sounds are normal. There is no tenderness.  Musculoskeletal: Normal range of motion. She exhibits no edema or deformity.  Neurological: She is alert and oriented to person, place, and time.  Skin: Skin is warm and dry. She is not diaphoretic.  Psychiatric: She has a normal mood and affect.    BP 122/60 (BP Location: Left Arm, Patient Position: Sitting, Cuff Size: Normal)   Pulse 79   Temp 98.4 F (36.9 C) (Oral)   Resp 18  Wt 223 lb (101.2 kg)   SpO2 94%   BMI 38.28 kg/m  Wt Readings from Last 3 Encounters:  10/23/16 223 lb (101.2 kg)  09/13/16 221 lb 12.8 oz (100.6 kg)  08/16/16 220 lb (99.8 kg)   BP Readings from Last 3 Encounters:  10/23/16 122/60  09/13/16 114/70  08/16/16 128/78     Immunization History  Administered Date(s) Administered  . Influenza, Seasonal, Injecte, Preservative Fre 11/21/2014  . Influenza-Unspecified 11/21/2014  . Pneumococcal Conjugate-13 03/12/2015  . Pneumococcal Polysaccharide-23 10/23/2016  . Td 08/31/2015  . Tdap 08/31/2015    Health Maintenance  Topic Date Due  . FOOT EXAM  04/30/1953  . INFLUENZA VACCINE  08/30/2016  . OPHTHALMOLOGY EXAM  04/18/2017  . HEMOGLOBIN A1C  04/22/2017  . MAMMOGRAM  03/09/2018  . TETANUS/TDAP  08/30/2025  . COLONOSCOPY  10/04/2026  . DEXA SCAN  Completed  . Hepatitis C Screening  Completed  . PNA vac Low Risk Adult  Completed    Lab Results  Component Value Date   WBC 12.4 (H) 10/23/2016   HGB 13.6 10/23/2016   HCT 42.1 10/23/2016   PLT 311.0 10/23/2016   GLUCOSE 111 (H) 10/23/2016   CHOL 163 10/23/2016   TRIG 282.0 (H) 10/23/2016   HDL 51.60 10/23/2016   LDLDIRECT 69.0 10/23/2016   LDLCALC (H) 01/31/2007    117        Total Cholesterol/HDL:CHD Risk Coronary Heart Disease Risk Table                     Men   Women  1/2 Average Risk   3.4   3.3  Average Risk       5.0   4.4  2 X Average  Risk   9.6   7.1  3 X Average Risk  23.4   11.0        Use the calculated Patient Ratio above and the CHD Risk Table to determine the patient's CHD Risk.        ATP III CLASSIFICATION (LDL):  <100     mg/dL   Optimal  100-129  mg/dL   Near or Above                    Optimal  130-159  mg/dL   Borderline  160-189  mg/dL   High  >190     mg/dL   Very High   ALT 52 (H) 10/23/2016   AST 61 (H) 10/23/2016   NA 140 10/23/2016   K 4.1 10/23/2016   CL 103 10/23/2016   CREATININE 0.87 10/23/2016   BUN 19 10/23/2016   CO2 31 10/23/2016   TSH 2.12 10/23/2016   INR 1.0 07/24/2016   HGBA1C 7.2 (H) 10/23/2016    Lab Results  Component Value Date   TSH 2.12 10/23/2016   Lab Results  Component Value Date   WBC 12.4 (H) 10/23/2016   HGB 13.6 10/23/2016   HCT 42.1 10/23/2016   MCV 97.6 10/23/2016   PLT 311.0 10/23/2016   Lab Results  Component Value Date   NA 140 10/23/2016   K 4.1 10/23/2016   CHLORIDE 107 05/04/2016   CO2 31 10/23/2016   GLUCOSE 111 (H) 10/23/2016   BUN 19 10/23/2016   CREATININE 0.87 10/23/2016   BILITOT 0.5 10/23/2016   ALKPHOS 62 10/23/2016   AST 61 (H) 10/23/2016   ALT 52 (H) 10/23/2016   PROT 7.6 10/23/2016   ALBUMIN 4.2 10/23/2016  CALCIUM 10.3 10/23/2016   ANIONGAP 12 (H) 05/04/2016   EGFR 59 (L) 05/04/2016   GFR 67.74 10/23/2016   Lab Results  Component Value Date   CHOL 163 10/23/2016   Lab Results  Component Value Date   HDL 51.60 10/23/2016   Lab Results  Component Value Date   LDLCALC (H) 01/31/2007    117        Total Cholesterol/HDL:CHD Risk Coronary Heart Disease Risk Table                     Men   Women  1/2 Average Risk   3.4   3.3  Average Risk       5.0   4.4  2 X Average Risk   9.6   7.1  3 X Average Risk  23.4   11.0        Use the calculated Patient Ratio above and the CHD Risk Table to determine the patient's CHD Risk.        ATP III CLASSIFICATION (LDL):  <100     mg/dL   Optimal  100-129  mg/dL   Near or  Above                    Optimal  130-159  mg/dL   Borderline  160-189  mg/dL   High  >190     mg/dL   Very High   Lab Results  Component Value Date   TRIG 282.0 (H) 10/23/2016   Lab Results  Component Value Date   CHOLHDL 3 10/23/2016   Lab Results  Component Value Date   HGBA1C 7.2 (H) 10/23/2016         Assessment & Plan:   Problem List Items Addressed This Visit    Morbid obesity ([place]) (Chronic)    Encouraged DASH diet, decrease po intake and increase exercise as tolerated. Needs 7-8 hours of sleep nightly. Avoid trans fats, eat small, frequent meals every 4-5 hours with lean proteins, complex carbs and healthy fats. Minimize simple carbs, GMO foods.      Cardiomyopathy ([place]) (Chronic)    Follows with cardiology.       Relevant Orders   CBC (Completed)   TSH (Completed)   Hyperlipidemia    Encouraged heart healthy diet, increase exercise, avoid trans fats, consider a krill oil cap daily      Relevant Orders   Lipid panel (Completed)   Osteopenia    Encouraged DASH diet, decrease po intake and increase exercise as tolerated. Needs 7-8 hours of sleep nightly. Avoid trans fats, eat small, frequent meals every 4-5 hours with lean proteins, complex carbs and healthy fats. Minimize simple carbs,      Relevant Orders   Comprehensive metabolic panel (Completed)   Low serum vitamin D    Labs reveal deficiency. Start on Vitamin D 50000 IU caps, 1 cap po weekly x 12 weeks.Also take daily Vitamin D over the counter. If already taking a daily supplement increase by 1000 IU daily and if not start Vitamin D 2000 IU daily.       Relevant Orders   Comprehensive metabolic panel (Completed)   Gout    Right great toe bunion, flared and painful x 2 weeks. Encouraged to hydrate and avoid offending foods. Check uric acid level. Increase hydration and try topical treatments. Has used colchicine in past but the cost has gone up so has not picked up new colchicine prescription.  Relevant Medications   colchicine 0.6 MG tablet   Other Relevant Orders   Uric acid (Completed)   DM (diabetes mellitus), type 2 ([place]) - Primary    hgba1c acceptable, minimize simple carbs. Increase exercise as tolerated. Continue current meds      UTI (urinary tract infection)    EColi UTI treat with Augmentin 875 bid         I have changed Ms. Lacey's allopurinol. I am also having her start on colchicine and amoxicillin-clavulanate. Additionally, I am having her maintain her diphenhydramine-acetaminophen, glucosamine-chondroitin, meclizine, omeprazole, famotidine, Vitamin D (Ergocalciferol), hyoscyamine, aspirin EC, furosemide, losartan, Fish Oil, carvedilol, and atorvastatin.  Meds ordered this encounter  Medications  . colchicine 0.6 MG tablet    Sig: 2 tabs po once then 1 tab every 2 hours til resolved or max of 6 tabs in 24 hours.    Dispense:  6 tablet    Refill:  2  . allopurinol (ZYLOPRIM) 100 MG tablet    Sig: Take 2 tablets (200 mg total) by mouth daily.    Dispense:  60 tablet    Refill:  3  . amoxicillin-clavulanate (AUGMENTIN) 875-125 MG tablet    Sig: Take 1 tablet by mouth 2 (two) times daily.    Dispense:  10 tablet    Refill:  0    CMA served as scribe during this visit. History, Physical and Plan performed by medical provider. Documentation and orders reviewed and attested to.  Penni Homans, MD

## 2016-10-24 LAB — CBC
HEMATOCRIT: 42.1 % (ref 36.0–46.0)
HEMOGLOBIN: 13.6 g/dL (ref 12.0–15.0)
MCHC: 32.4 g/dL (ref 30.0–36.0)
MCV: 97.6 fl (ref 78.0–100.0)
Platelets: 311 10*3/uL (ref 150.0–400.0)
RBC: 4.31 Mil/uL (ref 3.87–5.11)
RDW: 14 % (ref 11.5–15.5)
WBC: 12.4 10*3/uL — ABNORMAL HIGH (ref 4.0–10.5)

## 2016-10-24 LAB — LIPID PANEL
Cholesterol: 163 mg/dL (ref 0–200)
HDL: 51.6 mg/dL (ref >39.00)
NonHDL: 111.63
Total CHOL/HDL Ratio: 3
Triglycerides: 282 mg/dL — ABNORMAL HIGH (ref 0.0–149.0)
VLDL: 56.4 mg/dL — ABNORMAL HIGH (ref 0.0–40.0)

## 2016-10-24 LAB — COMPREHENSIVE METABOLIC PANEL
ALK PHOS: 62 U/L (ref 39–117)
ALT: 52 U/L — ABNORMAL HIGH (ref 0–35)
AST: 61 U/L — ABNORMAL HIGH (ref 0–37)
Albumin: 4.2 g/dL (ref 3.5–5.2)
BILIRUBIN TOTAL: 0.5 mg/dL (ref 0.2–1.2)
BUN: 19 mg/dL (ref 6–23)
CHLORIDE: 103 meq/L (ref 96–112)
CO2: 31 meq/L (ref 19–32)
Calcium: 10.3 mg/dL (ref 8.4–10.5)
Creatinine, Ser: 0.87 mg/dL (ref 0.40–1.20)
GFR: 67.74 mL/min (ref >60.00)
GLUCOSE: 111 mg/dL — AB (ref 70–99)
POTASSIUM: 4.1 meq/L (ref 3.5–5.1)
SODIUM: 140 meq/L (ref 135–145)
Total Protein: 7.6 g/dL (ref 6.0–8.3)

## 2016-10-24 LAB — URINALYSIS, ROUTINE W REFLEX MICROSCOPIC
Bilirubin Urine: NEGATIVE
HGB URINE DIPSTICK: NEGATIVE
Ketones, ur: NEGATIVE
Nitrite: POSITIVE — AB
RBC / HPF: NONE SEEN (ref 0–?)
SPECIFIC GRAVITY, URINE: 1.02 (ref 1.000–1.030)
Total Protein, Urine: NEGATIVE
URINE GLUCOSE: NEGATIVE
Urobilinogen, UA: 0.2 (ref 0.0–1.0)
pH: 6 (ref 5.0–8.0)

## 2016-10-24 LAB — LDL CHOLESTEROL, DIRECT: LDL DIRECT: 69 mg/dL

## 2016-10-24 LAB — TSH: TSH: 2.12 u[IU]/mL (ref 0.35–4.50)

## 2016-10-24 LAB — URIC ACID: URIC ACID, SERUM: 6.4 mg/dL (ref 2.4–7.0)

## 2016-10-24 LAB — HEMOGLOBIN A1C: Hgb A1c MFr Bld: 7.2 % — ABNORMAL HIGH (ref 4.6–6.5)

## 2016-10-26 ENCOUNTER — Encounter: Payer: Self-pay | Admitting: Family Medicine

## 2016-10-26 LAB — URINE CULTURE
MICRO NUMBER: 81054114
SPECIMEN QUALITY:: ADEQUATE

## 2016-10-27 DIAGNOSIS — R69 Illness, unspecified: Secondary | ICD-10-CM | POA: Diagnosis not present

## 2016-10-27 MED ORDER — AMOXICILLIN-POT CLAVULANATE 875-125 MG PO TABS: 1.0000 | ORAL_TABLET | Freq: Two times a day (BID) | ORAL | 0 refills | Status: DC

## 2016-10-29 ENCOUNTER — Encounter: Payer: Self-pay | Admitting: Family Medicine

## 2016-10-29 DIAGNOSIS — N390 Urinary tract infection, site not specified: Secondary | ICD-10-CM | POA: Insufficient documentation

## 2016-10-29 NOTE — Assessment & Plan Note (Signed)
hgba1c acceptable, minimize simple carbs. Increase exercise as tolerated. Continue current meds 

## 2016-10-29 NOTE — Assessment & Plan Note (Signed)
EColi UTI treat with Augmentin 875 bid

## 2016-10-30 ENCOUNTER — Other Ambulatory Visit: Payer: Self-pay | Admitting: *Deleted

## 2016-10-30 DIAGNOSIS — C649 Malignant neoplasm of unspecified kidney, except renal pelvis: Secondary | ICD-10-CM

## 2016-11-01 ENCOUNTER — Ambulatory Visit (HOSPITAL_COMMUNITY)
Admission: RE | Admit: 2016-11-01 | Discharge: 2016-11-01 | Disposition: A | Discharge status: Home / Self Care | Payer: Medicare HMO | Source: Ambulatory Visit | Attending: Oncology | Admitting: Oncology

## 2016-11-01 ENCOUNTER — Encounter (HOSPITAL_COMMUNITY): Payer: Self-pay

## 2016-11-01 ENCOUNTER — Other Ambulatory Visit (HOSPITAL_BASED_OUTPATIENT_CLINIC_OR_DEPARTMENT_OTHER): Payer: Medicare HMO

## 2016-11-01 DIAGNOSIS — Z85528 Personal history of other malignant neoplasm of kidney: Secondary | ICD-10-CM

## 2016-11-01 DIAGNOSIS — I700 Atherosclerosis of aorta: Secondary | ICD-10-CM | POA: Insufficient documentation

## 2016-11-01 DIAGNOSIS — I2510 Atherosclerotic heart disease of native coronary artery without angina pectoris: Secondary | ICD-10-CM | POA: Insufficient documentation

## 2016-11-01 DIAGNOSIS — R935 Abnormal findings on diagnostic imaging of other abdominal regions, including retroperitoneum: Secondary | ICD-10-CM | POA: Diagnosis not present

## 2016-11-01 DIAGNOSIS — C649 Malignant neoplasm of unspecified kidney, except renal pelvis: Secondary | ICD-10-CM

## 2016-11-01 DIAGNOSIS — Z905 Acquired absence of kidney: Secondary | ICD-10-CM | POA: Insufficient documentation

## 2016-11-01 DIAGNOSIS — M5136 Other intervertebral disc degeneration, lumbar region: Secondary | ICD-10-CM | POA: Insufficient documentation

## 2016-11-01 DIAGNOSIS — K5730 Diverticulosis of large intestine without perforation or abscess without bleeding: Secondary | ICD-10-CM | POA: Insufficient documentation

## 2016-11-01 DIAGNOSIS — M47816 Spondylosis without myelopathy or radiculopathy, lumbar region: Secondary | ICD-10-CM | POA: Insufficient documentation

## 2016-11-01 DIAGNOSIS — C7800 Secondary malignant neoplasm of unspecified lung: Secondary | ICD-10-CM | POA: Diagnosis not present

## 2016-11-01 LAB — CBC WITH DIFFERENTIAL/PLATELET
BASO%: 1 % (ref 0.0–2.0)
BASOS ABS: 0.1 10*3/uL (ref 0.0–0.1)
EOS ABS: 0.3 10*3/uL (ref 0.0–0.5)
EOS%: 3 % (ref 0.0–7.0)
HCT: 41.6 % (ref 34.8–46.6)
HEMOGLOBIN: 13.9 g/dL (ref 11.6–15.9)
LYMPH%: 28.4 % (ref 14.0–49.7)
MCH: 31.8 pg (ref 25.1–34.0)
MCHC: 33.5 g/dL (ref 31.5–36.0)
MCV: 94.8 fL (ref 79.5–101.0)
MONO#: 0.6 10*3/uL (ref 0.1–0.9)
MONO%: 6.2 % (ref 0.0–14.0)
NEUT#: 5.7 10*3/uL (ref 1.5–6.5)
NEUT%: 61.4 % (ref 38.4–76.8)
Platelets: 254 10*3/uL (ref 145–400)
RBC: 4.38 10*6/uL (ref 3.70–5.45)
RDW: 14.2 % (ref 11.2–14.5)
WBC: 9.3 10*3/uL (ref 3.9–10.3)
lymph#: 2.7 10*3/uL (ref 0.9–3.3)

## 2016-11-01 LAB — COMPREHENSIVE METABOLIC PANEL
ALBUMIN: 3.8 g/dL (ref 3.5–5.0)
ALT: 75 U/L — ABNORMAL HIGH (ref 0–55)
AST: 75 U/L — AB (ref 5–34)
Alkaline Phosphatase: 72 U/L (ref 40–150)
Anion Gap: 11 mEq/L (ref 3–11)
BUN: 17.9 mg/dL (ref 7.0–26.0)
CHLORIDE: 104 meq/L (ref 98–109)
CO2: 26 meq/L (ref 22–29)
Calcium: 10.3 mg/dL (ref 8.4–10.4)
Creatinine: 1.1 mg/dL (ref 0.6–1.1)
EGFR: 50 mL/min/{1.73_m2} — AB (ref >90)
GLUCOSE: 141 mg/dL — AB (ref 70–140)
POTASSIUM: 4.4 meq/L (ref 3.5–5.1)
SODIUM: 140 meq/L (ref 136–145)
Total Bilirubin: 0.78 mg/dL (ref 0.20–1.20)
Total Protein: 7.8 g/dL (ref 6.4–8.3)

## 2016-11-01 MED ORDER — IOPAMIDOL (ISOVUE-300) INJECTION 61%
100.0000 mL | Freq: Once | INTRAVENOUS | Status: AC | PRN
  Administered 2016-11-01: 100 mL via INTRAVENOUS

## 2016-11-01 MED ORDER — IOPAMIDOL (ISOVUE-300) INJECTION 61%
INTRAVENOUS | Status: AC
Start: 2016-11-01 — End: 2016-11-01
  Filled 2016-11-01: qty 100

## 2016-11-03 ENCOUNTER — Ambulatory Visit (HOSPITAL_BASED_OUTPATIENT_CLINIC_OR_DEPARTMENT_OTHER): Payer: Medicare HMO | Admitting: Oncology

## 2016-11-03 VITALS — BP 135/51 | HR 86 | Temp 98.6°F | Resp 17 | Ht 64.0 in | Wt 222.2 lb

## 2016-11-03 DIAGNOSIS — Z85528 Personal history of other malignant neoplasm of kidney: Secondary | ICD-10-CM | POA: Diagnosis not present

## 2016-11-03 DIAGNOSIS — C649 Malignant neoplasm of unspecified kidney, except renal pelvis: Secondary | ICD-10-CM

## 2016-11-03 NOTE — Progress Notes (Signed)
Hematology and Oncology Follow Up Visit  Amanda Crawford [id number] 1945/03/04 71 y.o. 11/03/2016 9:08 AM  CC: Amanda Crawford, M.D.  Amanda Crawford, M.D.    Principle Diagnosis: 71 year old female diagnosed with renal cell carcinoma, diagnosed in 2001, had a T2 disease and had recurrent disease in a lung nodule in 2008.  Prior Therapy:  She is status post left nephrectomy in 2001.  She developed a left lingular mass, status post VATS procedure.  Pathology revealed a recurrent renal cell carcinoma resected in October 2008.   She developed relapse disease in 2014 and subsequently underwent a VATS and left upper lobe wedge resection of a tumor done on 05/09/2012. Pathology confirmed another relapse.  Current therapy: Observation and surveillance.  Interim History: Amanda Crawford presents today for a followup visit by her self. Since the last visit, she reports feeling reasonably well with few complaints. She reports some occasional gout flares which interferes with her ability to walk periodically. She does report some dyspnea on exertion but no cough or wheezing. She denied any hemoptysis or chest pain.  She continues to be active and performs activities of daily living without any decline. She denied any hematuria or dysuria. She denied any appetite changes and does report 6 pound intentional weight loss.  She does not report any headaches, blurry vision, syncope or seizures. She does not report any fevers, chills or sweats. She has not reported any GI symptoms including abdominal pain or diarrhea. She has not reported any fever or hematochezia. She does not report any frequency urgency or hesitancy. She does not report any skeletal complaints. Does not report any lymphadenopathy or petechiae. Rest of the review of systems unremarkable.   Medications: I have reviewed the patient's current medications. Current Outpatient Prescriptions  Medication Sig Dispense Refill  . allopurinol (ZYLOPRIM) 100  MG tablet Take 2 tablets (200 mg total) by mouth daily. 60 tablet 3  . amoxicillin-clavulanate (AUGMENTIN) 875-125 MG tablet Take 1 tablet by mouth 2 (two) times daily. 10 tablet 0  . aspirin EC 81 MG tablet Take 81 mg by mouth daily.    Marland Kitchen atorvastatin (LIPITOR) 10 MG tablet Take 1 tablet (10 mg total) by mouth every other day. 45 tablet 0  . carvedilol (COREG) 6.25 MG tablet Take 1 tablet (6.25 mg total) by mouth 2 (two) times daily. 180 tablet 1  . colchicine 0.6 MG tablet 2 tabs po once then 1 tab every 2 hours til resolved or max of 6 tabs in 24 hours. 6 tablet 2  . diphenhydramine-acetaminophen (TYLENOL PM) 25-500 MG TABS Take 1 tablet by mouth at bedtime as needed. For insomnia    . famotidine (PEPCID) 20 MG tablet Take 20 mg by mouth at bedtime as needed for heartburn or indigestion.     . furosemide (LASIX) 40 MG tablet Take 1 tablet (40 mg total) by mouth daily. 35 tablet 3  . glucosamine-chondroitin 500-400 MG tablet Take 1 tablet by mouth 2 (two) times a week.     . hyoscyamine (LEVSIN SL) 0.125 MG SL tablet Place 1 tablet (0.125 mg total) under the tongue every 4 (four) hours as needed. (Patient taking differently: Place 0.125 mg under the tongue every 4 (four) hours as needed for cramping. ) 30 tablet 1  . losartan (COZAAR) 25 MG tablet Take 0.5 tablets (12.5 mg total) by mouth daily. 15 tablet 2  . meclizine (ANTIVERT) 12.5 MG tablet Take 1 tablet (12.5 mg total) by mouth 3 (three) times daily  as needed for dizziness. As needed for   Dizziness or nausea 30 tablet 1  . Omega-3 Fatty Acids (FISH OIL) 1000 MG CAPS Take 1 capsule (1,000 mg total) by mouth 2 (two) times daily.  0  . omeprazole (PRILOSEC OTC) 20 MG tablet Take 20 mg by mouth daily as needed (acid reflux).     . Vitamin D, Ergocalciferol, (DRISDOL) 50000 units CAPS capsule TAKE 1 CAPSULE (50,000 UNITS TOTAL) BY MOUTH EVERY 7 (SEVEN) DAYS. TAKE FOR 12 WEEKS 4 capsule 4   No current facility-administered medications for this  visit.      Allergies: No Known Allergies  Past Medical History, Surgical history, Social history, and Family History were reviewed and updated.   Physical Exam: Blood pressure (!) 135/51, pulse 86, temperature 98.6 F (37 C), temperature source Oral, resp. rate 17, height 5\' 4"  (1.626 m), weight 222 lb 3.2 oz (100.8 kg), SpO2 93 %. ECOG: 1 General appearance: Well-appearing woman without distress. Head: Normocephalic, without obvious abnormality no oral ulcers or lesions. Neck: no adenopathy or neck masses. Lymph nodes: Cervical, supraclavicular, and axillary nodes normal. Heart:regular rate and rhythm, S1, S2 normal, no murmur, click, rub or gallop Lung:chest clear, no wheezing, rales, normal symmetric air entry.  Abdomin: soft, non-tender, without masses or organomegaly no shifting dullness or ascites. EXT:no erythema, induration, or nodules Skin: No rashes or lesions.  Lab Results: Lab Results  Component Value Date   WBC 9.3 11/01/2016   HGB 13.9 11/01/2016   HCT 41.6 11/01/2016   MCV 94.8 11/01/2016   PLT 254 11/01/2016     Chemistry      Component Value Date/Time   NA 140 11/01/2016 0818   K 4.4 11/01/2016 0818   CL 103 10/23/2016 1651   CL 107 04/16/2012 0923   CO2 26 11/01/2016 0818   BUN 17.9 11/01/2016 0818   CREATININE 1.1 11/01/2016 0818      Component Value Date/Time   CALCIUM 10.3 11/01/2016 0818   ALKPHOS 72 11/01/2016 0818   AST 75 (H) 11/01/2016 0818   ALT 75 (H) 11/01/2016 0818   BILITOT 0.78 11/01/2016 0818     IMPRESSION: 1. No findings of recurrent malignancy. Prior left upper lobe wedge resections and prior left nephrectomy. 2. There is a small fluid signal intensity lesion along the left upper omentum which is been slowly enlarging over the last 8 years. This is likely a benign lesion such as a small lymphangioma. 3. Borderline prominent porta hepatis lymph nodes, most likely to be reactive, may merit surveillance. 4. Other imaging  findings of potential clinical significance: Aortic Atherosclerosis (ICD10-I70.0). Coronary atherosclerosis. Sigmoid diverticulosis. Lumbar spondylosis and degenerative disc disease causing multilevel impingement.   Impression and Plan:   71 year old female with the following issues: 1. Renal cell carcinoma.  She has stage IV NED disease with lung nodules removed on 2 separate occasions the last of which removed in 04/2012. CT scan from 11/28/2016 did not show any evidence of relapsed disease. The plan is to continue with active surveillance and repeat imaging studies in April 2019 and annually after that. Systemic therapy can be used in the future she develops metastatic disease. 2.     Elevated liver function enzymes: No abnormalities noted on her liver. 3.     Dyspnea on exertion: Likely related to her weight and deconditioning. Unchanged from previous examinations. 4.     Follow up: in 6 months for clinical visit and a repeat CT scan.   Zola Button, MD  10/5/20189:08 AM

## 2016-11-04 ENCOUNTER — Other Ambulatory Visit: Payer: Self-pay | Admitting: Physician Assistant

## 2016-11-06 NOTE — Telephone Encounter (Signed)
Please review for refill, Thanks !  

## 2016-11-08 ENCOUNTER — Encounter: Payer: Self-pay | Admitting: Internal Medicine

## 2016-11-08 ENCOUNTER — Ambulatory Visit (INDEPENDENT_AMBULATORY_CARE_PROVIDER_SITE_OTHER): Payer: Medicare HMO | Admitting: Internal Medicine

## 2016-11-08 VITALS — BP 132/70 | HR 70 | Ht 64.0 in | Wt 224.0 lb

## 2016-11-08 DIAGNOSIS — I428 Other cardiomyopathies: Secondary | ICD-10-CM | POA: Diagnosis not present

## 2016-11-08 DIAGNOSIS — E785 Hyperlipidemia, unspecified: Secondary | ICD-10-CM | POA: Diagnosis not present

## 2016-11-08 DIAGNOSIS — I447 Left bundle-branch block, unspecified: Secondary | ICD-10-CM

## 2016-11-08 NOTE — Patient Instructions (Signed)
Your physician has requested that you have an echocardiogram in 6 months @ [address]. Echocardiography is a painless test that uses sound waves to create images of your heart. It provides your doctor with information about the size and shape of your heart and how well your heart's chambers and valves are working. This procedure takes approximately one hour. There are no restrictions for this procedure.   Your physician wants you to follow-up in: 6 months with Dr. Debara Pickett after echo. You will receive a reminder letter in the mail two months in advance. If you don't receive a letter, please call our office to schedule the follow-up appointment.

## 2016-11-08 NOTE — Progress Notes (Signed)
OFFICE CONSULT NOTE  Chief Complaint:  Follow-up heart failure  Primary Care Physician: [name], MD  HPI:  Amanda Crawford is a 71 y.o. female who is being seen today for the evaluation of shortness of breath at the request of [name], MD. Amanda Crawford has a past medical history significant for known left bundle branch block dating back to 2008. At the time she had presented with chest pain that radiated to her back and was found to have the abnormal EKG which led to left heart catheterization in January 2009. This was with Dr. Tamala Julian and indicated angiographically normal coronaries. Subsequently she was found to have renal cell carcinoma and underwent nephrectomy and ultimately was found to have recurrent metastatic renal disease to the long and required 2 partial lobectomies, the last in 2014. She also is moderately obese and has mild dyslipidemia on atorvastatin, but is not diabetic. She reports her shortness of breath has recently worsened and she finds that she has difficulty for example singing at church or caring on a long conversation. She also gets fatigue easier and has difficulty walking up stairs or laying flat. She does report a mild nonproductive cough and slight wheezing. Her PCP ordered an echocardiogram, performed on 07/12/2016 which demonstrated a newly decreased EF to 35-40% from 50%. There was LVH and mild diastolic dysfunction as well as mild mitral regurgitation.  11/08/2016  Amanda Crawford returns today for follow-up. She reports improvement in her heart failure symptoms. Her diuretic was increased to 40 mg daily. She also had an increase in carvedilol to 6.25 mg twice a day. She is on a low-dose are due to having a solitary kidney. In June she underwent left and right heart catheterization by Dr. Ellyn Hack which demonstrated a mild nonobstructive coronary disease which is out of proportion for LV function. She was felt to be volume overloaded necessitating the above  changes. Since then she says she's done very well. Weight is down at least 4 pounds but is been as much is 8 pounds lower since June.  PMHx:  Past Medical History:  Diagnosis Date  . Allergic state 03/12/2015  . Arthritis   . Colon polyp 11/19/2014  . Dermatitis 03/12/2015  . Diarrhea 06/29/2016  . DM (diabetes mellitus), type 2 ([place]) 06/29/2016  . GERD (gastroesophageal reflux disease)    occ  . Gout 03/12/2015  . H/O measles   . H/O mumps   . Headache(784.0)    migraines  . History of chicken pox 11/23/2014  . Hyperglycemia 06/29/2016  . Lung cancer ([place])   . Migraine 11/23/2014  . Obesity 11/23/2014  . Pneumonia    child  . Preventative health care 09/12/2015  . Renal cell cancer ([place])    renal cell ca dx 9/01 and 8/08;  . Shortness of breath    occ  . Skin lesion of breast 03/12/2015    Past Surgical History:  Procedure Laterality Date  . ABDOMINAL HYSTERECTOMY    . APPENDECTOMY    . CARDIAC CATHETERIZATION     yrs ago neg  . CHOLECYSTECTOMY    . LUNG CANCER SURGERY Left 08  . RENAL MASS EXCISION Left 01  . RIGHT/LEFT HEART CATH AND CORONARY ANGIOGRAPHY N/A 07/27/2016   Procedure: Right/Left Heart Cath and Coronary Angiography;  Surgeon: Leonie Man, MD;  Location: [place] [facility];  Service: Cardiovascular;  Laterality: N/A;  . THORACOTOMY Left 05/09/2012   Procedure: THORACOTOMY MAJOR;  Surgeon: Gaye Pollack, MD;  Location: [facility];  Service: Thoracic;  Laterality: Left;  Marland Kitchen VIDEO BRONCHOSCOPY N/A 05/09/2012   Procedure: VIDEO BRONCHOSCOPY;  Surgeon: Gaye Pollack, MD;  Location: [facility];  Service: Thoracic;  Laterality: N/A;  . WEDGE RESECTION Left 05/09/2012   Procedure: LEFT UPPER LOBE WEDGE RESECTION;  Surgeon: Gaye Pollack, MD;  Location: [facility] OR;  Service: Thoracic;  Laterality: Left;    FAMHx:  Family History  Problem Relation Age of Onset  . Heart failure Father   . Asthma Brother   . Asthma Daughter     SOCHx:   reports that she quit smoking about  10 years ago. Her smoking use included Cigarettes. She has a 17.50 pack-year smoking history. She has never used smokeless tobacco. She reports that she does not drink alcohol or use drugs.  ALLERGIES:  No Known Allergies  ROS: Pertinent items noted in HPI and remainder of comprehensive ROS otherwise negative.  HOME MEDS: Current Outpatient Prescriptions on File Prior to Visit  Medication Sig Dispense Refill  . allopurinol (ZYLOPRIM) 100 MG tablet Take 2 tablets (200 mg total) by mouth daily. 60 tablet 3  . amoxicillin-clavulanate (AUGMENTIN) 875-125 MG tablet Take 1 tablet by mouth 2 (two) times daily. 10 tablet 0  . aspirin EC 81 MG tablet Take 81 mg by mouth daily.    Marland Kitchen atorvastatin (LIPITOR) 10 MG tablet Take 1 tablet (10 mg total) by mouth every other day. 45 tablet 0  . carvedilol (COREG) 6.25 MG tablet Take 1 tablet (6.25 mg total) by mouth 2 (two) times daily. 180 tablet 1  . colchicine 0.6 MG tablet 2 tabs po once then 1 tab every 2 hours til resolved or max of 6 tabs in 24 hours. 6 tablet 2  . diphenhydramine-acetaminophen (TYLENOL PM) 25-500 MG TABS Take 1 tablet by mouth at bedtime as needed. For insomnia    . famotidine (PEPCID) 20 MG tablet Take 20 mg by mouth at bedtime as needed for heartburn or indigestion.     . furosemide (LASIX) 40 MG tablet Take 1 tablet (40 mg total) by mouth daily. 35 tablet 3  . glucosamine-chondroitin 500-400 MG tablet Take 1 tablet by mouth 2 (two) times a week.     . hyoscyamine (LEVSIN SL) 0.125 MG SL tablet Place 1 tablet (0.125 mg total) under the tongue every 4 (four) hours as needed. (Patient taking differently: Place 0.125 mg under the tongue every 4 (four) hours as needed for cramping. ) 30 tablet 1  . losartan (COZAAR) 25 MG tablet TAKE 0.5 TABLETS (12.5 MG TOTAL) BY MOUTH DAILY. 15 tablet 6  . meclizine (ANTIVERT) 12.5 MG tablet Take 1 tablet (12.5 mg total) by mouth 3 (three) times daily as needed for dizziness. As needed for   Dizziness or  nausea 30 tablet 1  . Omega-3 Fatty Acids (FISH OIL) 1000 MG CAPS Take 1 capsule (1,000 mg total) by mouth 2 (two) times daily.  0  . omeprazole (PRILOSEC OTC) 20 MG tablet Take 20 mg by mouth daily as needed (acid reflux).     . Vitamin D, Ergocalciferol, (DRISDOL) 50000 units CAPS capsule TAKE 1 CAPSULE (50,000 UNITS TOTAL) BY MOUTH EVERY 7 (SEVEN) DAYS. TAKE FOR 12 WEEKS 4 capsule 4   No current facility-administered medications on file prior to visit.     LABS/IMAGING: No results found for this or any previous visit (from the past 48 hour(s)). No results found.  LIPID PANEL:    Component Value Date/Time  CHOL 163 10/23/2016 1651   TRIG 282.0 (H) 10/23/2016 1651   HDL 51.60 10/23/2016 1651   CHOLHDL 3 10/23/2016 1651   VLDL 56.4 (H) 10/23/2016 1651   LDLCALC (H) 01/31/2007 0800    117        Total Cholesterol/HDL:CHD Risk Coronary Heart Disease Risk Table                     Men   Women  1/2 Average Risk   3.4   3.3  Average Risk       5.0   4.4  2 X Average Risk   9.6   7.1  3 X Average Risk  23.4   11.0        Use the calculated Patient Ratio above and the CHD Risk Table to determine the patient's CHD Risk.        ATP III CLASSIFICATION (LDL):  <100     mg/dL   Optimal  100-129  mg/dL   Near or Above                    Optimal  130-159  mg/dL   Borderline  160-189  mg/dL   High  >190     mg/dL   Very High   LDLDIRECT 69.0 10/23/2016 1651    WEIGHTS: Wt Readings from Last 3 Encounters:  11/08/16 224 lb (101.6 kg)  11/03/16 222 lb 3.2 oz (100.8 kg)  10/23/16 223 lb (101.2 kg)    VITALS: BP 132/70   Pulse 70   Ht 5\' 4"  (1.626 m)   Wt 224 lb (101.6 kg)   BMI 38.45 kg/m   EXAM: General appearance: alert and no distress Neck: no carotid bruit and no JVD Lungs: clear to auscultation bilaterally Heart: regular rate and rhythm, S1, S2 normal, no murmur, click, rub or gallop Abdomen: soft, non-tender; bowel sounds normal; no masses,  no  organomegaly Extremities: edema Trace sock line edema Pulses: 2+ and symmetric Skin: Skin color, texture, turgor normal. No rashes or lesions Neurologic: Grossly normal Psych: Pleasant  EKG: Normal sinus rhythm at 70, LBBB, QRSD 148 ms-personally reviewed  ASSESSMENT: 1. Progressive dyspnea and exertion 2. LBBB 3. New onset cardiomyopathy with EF 35-40% 4. Mild non-obstructive CAD by cath (06/2016) 5. Dyslipidemia  PLAN: 1.   Amanda Crawford has had symptomatic improvement with changes in her beta blocker and adding furosemide. Essentially she has a nonischemic cardiomyopathy was found to have mild nonobstructive coronary disease. Her cholesterol is well controlled with a direct LDL of 69. I suspect that she will have improvement in LV function on both carvedilol and low-dose losartan. I'm hesitant to place her on Entresto or higher dose ARB due to her solitary kidney. Plan to repeat an echocardiogram in 6 months and we'll see her back at that time. If LV function has not increased or is lower than 35%, then I would likely refer her for evaluation of CRT-P device.  Pixie Casino, MD, [place]  Attending Cardiologist  Direct Dial: [phone]  Fax: [phone]  Website:  [email or web address]  Nadean Corwin Hilty 11/08/2016, 11:10 AM

## 2016-11-14 ENCOUNTER — Other Ambulatory Visit: Payer: Self-pay | Admitting: Family Medicine

## 2016-12-15 ENCOUNTER — Other Ambulatory Visit: Payer: Self-pay | Admitting: Cardiology

## 2016-12-15 NOTE — Telephone Encounter (Signed)
Rx(s) sent to pharmacy electronically.  

## 2016-12-23 ENCOUNTER — Other Ambulatory Visit: Payer: Self-pay | Admitting: Family Medicine

## 2017-01-24 NOTE — Progress Notes (Deleted)
Subjective:   Amanda Crawford is a 71 y.o. female who presents for an Initial Medicare Annual Wellness Visit.  The Patient was informed that the wellness visit is to identify future health risk and educate and initiate measures that can reduce risk for increased disease through the lifespan.   Describes health as fair, good or great?  Review of Systems    No ROS.  Medicare Wellness Visit. Additional risk factors are reflected in the social history.   Sleep patterns:  Home Safety/Smoke Alarms: Feels safe in home. Smoke alarms in place.    Female:   Pap-       Mammo-  Last 03/09/16: BI-RADS CATEGORY  1: Negative.     Dexa scan-        [facility]-last 10/03/16. No result on file   Objective:    There were no vitals filed for this visit. There is no height or weight on file to calculate BMI.  Advanced Directives 07/27/2016 05/04/2016 11/04/2015 04/23/2015 04/16/2014 05/09/2012 05/07/2012  Does Patient Have a Medical Advance Directive? Yes Yes Yes Yes Yes Patient has advance directive, copy not in chart Patient has advance directive, copy not in chart  Type of Advance Directive [place];Living will Living will Living will Living will Living will Living will Living will  Does patient want to make changes to medical advance directive? No - Patient declined - No - Patient declined No - Patient declined - - -  Copy of [place] in Chart? No - copy requested - No - copy requested No - copy requested Yes Copy requested from family -  Pre-existing out of facility DNR order (yellow form or pink MOST form) - - - - - No -    Current Medications (verified) Outpatient Encounter Medications as of 01/26/2017  Medication Sig  . allopurinol (ZYLOPRIM) 100 MG tablet Take 2 tablets (200 mg total) by mouth daily.  Marland Kitchen allopurinol (ZYLOPRIM) 100 MG tablet TAKE 1 TABLET (100 MG TOTAL) BY MOUTH DAILY.  Marland Kitchen amoxicillin-clavulanate (AUGMENTIN) 875-125 MG tablet Take 1 tablet by mouth 2  (two) times daily.  Marland Kitchen aspirin EC 81 MG tablet Take 81 mg by mouth daily.  Marland Kitchen atorvastatin (LIPITOR) 10 MG tablet TAKE 1 TABLET (10 MG TOTAL) BY MOUTH EVERY OTHER DAY.  . carvedilol (COREG) 6.25 MG tablet Take 1 tablet (6.25 mg total) by mouth 2 (two) times daily.  . colchicine 0.6 MG tablet 2 tabs po once then 1 tab every 2 hours til resolved or max of 6 tabs in 24 hours.  . diphenhydramine-acetaminophen (TYLENOL PM) 25-500 MG TABS Take 1 tablet by mouth at bedtime as needed. For insomnia  . famotidine (PEPCID) 20 MG tablet Take 20 mg by mouth at bedtime as needed for heartburn or indigestion.   . furosemide (LASIX) 40 MG tablet TAKE 1 TABLET BY MOUTH EVERY DAY  . glucosamine-chondroitin 500-400 MG tablet Take 1 tablet by mouth 2 (two) times a week.   . hyoscyamine (LEVSIN SL) 0.125 MG SL tablet Place 1 tablet (0.125 mg total) under the tongue every 4 (four) hours as needed. (Patient taking differently: Place 0.125 mg under the tongue every 4 (four) hours as needed for cramping. )  . losartan (COZAAR) 25 MG tablet TAKE 0.5 TABLETS (12.5 MG TOTAL) BY MOUTH DAILY.  . meclizine (ANTIVERT) 12.5 MG tablet Take 1 tablet (12.5 mg total) by mouth 3 (three) times daily as needed for dizziness. As needed for   Dizziness or nausea  .  Omega-3 Fatty Acids (FISH OIL) 1000 MG CAPS Take 1 capsule (1,000 mg total) by mouth 2 (two) times daily.  Marland Kitchen omeprazole (PRILOSEC OTC) 20 MG tablet Take 20 mg by mouth daily as needed (acid reflux).   . Vitamin D, Ergocalciferol, (DRISDOL) 50000 units CAPS capsule TAKE 1 CAPSULE (50,000 UNITS TOTAL) BY MOUTH EVERY 7 (SEVEN) DAYS. TAKE FOR 12 WEEKS   No facility-administered encounter medications on file as of 01/26/2017.     Allergies (verified) Patient has no known allergies.   History: Past Medical History:  Diagnosis Date  . Allergic state 03/12/2015  . Arthritis   . Colon polyp 11/19/2014  . Dermatitis 03/12/2015  . Diarrhea 06/29/2016  . DM (diabetes mellitus), type  2 ([place]) 06/29/2016  . GERD (gastroesophageal reflux disease)    occ  . Gout 03/12/2015  . H/O measles   . H/O mumps   . Headache(784.0)    migraines  . History of chicken pox 11/23/2014  . Hyperglycemia 06/29/2016  . Lung cancer ([place])   . Migraine 11/23/2014  . Obesity 11/23/2014  . Pneumonia    child  . Preventative health care 09/12/2015  . Renal cell cancer ([place])    renal cell ca dx 9/01 and 8/08;  . Shortness of breath    occ  . Skin lesion of breast 03/12/2015   Past Surgical History:  Procedure Laterality Date  . ABDOMINAL HYSTERECTOMY    . APPENDECTOMY    . CARDIAC CATHETERIZATION     yrs ago neg  . CHOLECYSTECTOMY    . LUNG CANCER SURGERY Left 08  . RENAL MASS EXCISION Left 01  . RIGHT/LEFT HEART CATH AND CORONARY ANGIOGRAPHY N/A 07/27/2016   Procedure: Right/Left Heart Cath and Coronary Angiography;  Surgeon: Leonie Man, MD;  Location: [place] [facility];  Service: Cardiovascular;  Laterality: N/A;  . THORACOTOMY Left 05/09/2012   Procedure: THORACOTOMY MAJOR;  Surgeon: Gaye Pollack, MD;  Location: [place];  Service: Thoracic;  Laterality: Left;  Marland Kitchen VIDEO BRONCHOSCOPY N/A 05/09/2012   Procedure: VIDEO BRONCHOSCOPY;  Surgeon: Gaye Pollack, MD;  Location: [facility];  Service: Thoracic;  Laterality: N/A;  . WEDGE RESECTION Left 05/09/2012   Procedure: LEFT UPPER LOBE WEDGE RESECTION;  Surgeon: Gaye Pollack, MD;  Location: [facility] OR;  Service: Thoracic;  Laterality: Left;   Family History  Problem Relation Age of Onset  . Heart failure Father   . Asthma Brother   . Asthma Daughter    Social History   Socioeconomic History  . Marital status: Married    Spouse name: Not on file  . Number of children: Not on file  . Years of education: Not on file  . Highest education level: Not on file  Social Needs  . Financial resource strain: Not on file  . Food insecurity - worry: Not on file  . Food insecurity - inability: Not on file  . Transportation needs - medical: Not on  file  . Transportation needs - non-medical: Not on file  Occupational History  . Not on file  Tobacco Use  . Smoking status: Former Smoker    Packs/day: 0.50    Years: 35.00    Pack years: 17.50    Types: Cigarettes    Last attempt to quit: 05/08/2006    Years since quitting: 10.7  . Smokeless tobacco: Never Used  Substance and Sexual Activity  . Alcohol use: No  . Drug use: No  . Sexual activity: Not on file  Comment: lives with husband, no dietary restrictions.   Other Topics Concern  . Not on file  Social History Narrative  . Not on file    Tobacco Counseling Counseling given: Not Answered   Clinical Intake:                        Activities of Daily Living In your present state of health, do you have any difficulty performing the following activities: 07/27/2016  Hearing? N  Vision? N  Difficulty concentrating or making decisions? N  Walking or climbing stairs? N  Dressing or bathing? N  Some recent data might be hidden     Immunizations and Health Maintenance Immunization History  Administered Date(s) Administered  . Influenza, High Dose Seasonal PF 10/27/2016  . Influenza, Seasonal, Injecte, Preservative Fre 11/21/2014  . Influenza-Unspecified 11/21/2014  . Pneumococcal Conjugate-13 03/12/2015  . Pneumococcal Polysaccharide-23 10/23/2016  . Td 08/31/2015  . Tdap 08/31/2015   Health Maintenance Due  Topic Date Due  . FOOT EXAM  04/30/1955    Patient Care Team: [name], MD as PCP - General (Family Medicine) Wyatt Portela, MD (Hematology and Oncology) Gaye Pollack, MD as Attending Physician (Cardiothoracic Surgery)  Indicate any recent Medical Services you may have received from other than [facility] providers in the past year (date may be approximate).     Assessment:   This is a routine wellness examination for [place]. Physical assessment deferred to PCP.  Hearing/Vision screen No exam data present  Dietary issues and  exercise activities discussed: Diet (meal preparation, eat out, water intake, caffeinated beverages, dairy products, fruits and vegetables): {Desc; diets:16563} Breakfast: Lunch:  Dinner:        Goals    None     Depression Screen PHQ 2/9 Scores 03/02/2016 02/18/2015 10/09/2013  PHQ - 2 Score 0 0 0    Fall Risk Fall Risk  03/02/2016 02/18/2015 10/09/2013  Falls in the past year? No No No    Cognitive Function:        Screening Tests Health Maintenance  Topic Date Due  . FOOT EXAM  04/30/1955  . OPHTHALMOLOGY EXAM  04/18/2017  . HEMOGLOBIN A1C  04/22/2017  . MAMMOGRAM  03/09/2018  . TETANUS/TDAP  08/30/2025  . COLONOSCOPY  10/04/2026  . INFLUENZA VACCINE  Completed  . DEXA SCAN  Completed  . Hepatitis C Screening  Completed  . PNA vac Low Risk Adult  Completed     Plan:   ***  I have personally reviewed and noted the following in the patient's chart:   . Medical and social history . Use of alcohol, tobacco or illicit drugs  . Current medications and supplements . Functional ability and status . Nutritional status . Physical activity . Advanced directives . List of other physicians . Hospitalizations, surgeries, and ER visits in previous 12 months . Vitals . Screenings to include cognitive, depression, and falls . Referrals and appointments  In addition, I have reviewed and discussed with patient certain preventive protocols, quality metrics, and best practice recommendations. A written personalized care plan for preventive services as well as general preventive health recommendations were provided to patient.     Shela Nevin, [place]   01/24/2017

## 2017-01-25 ENCOUNTER — Ambulatory Visit: Payer: Medicare HMO | Admitting: *Deleted

## 2017-01-26 ENCOUNTER — Encounter: Payer: Medicare HMO | Admitting: Family Medicine

## 2017-01-26 ENCOUNTER — Telehealth: Payer: Self-pay | Admitting: Family Medicine

## 2017-01-26 NOTE — Telephone Encounter (Signed)
Called patient and adv that Dr.[name] will not be in the office, left voicemail so that appopintment can be resech.

## 2017-02-06 ENCOUNTER — Ambulatory Visit (INDEPENDENT_AMBULATORY_CARE_PROVIDER_SITE_OTHER): Payer: Medicare HMO | Admitting: Family Medicine

## 2017-02-06 ENCOUNTER — Encounter: Payer: Self-pay | Admitting: Family Medicine

## 2017-02-06 VITALS — BP 102/62 | HR 69 | Temp 98.0°F | Resp 18 | Ht 64.0 in | Wt 220.6 lb

## 2017-02-06 DIAGNOSIS — M1A9XX0 Chronic gout, unspecified, without tophus (tophi): Secondary | ICD-10-CM | POA: Diagnosis not present

## 2017-02-06 DIAGNOSIS — Z0000 Encounter for general adult medical examination without abnormal findings: Secondary | ICD-10-CM | POA: Diagnosis not present

## 2017-02-06 DIAGNOSIS — E119 Type 2 diabetes mellitus without complications: Secondary | ICD-10-CM | POA: Diagnosis not present

## 2017-02-06 DIAGNOSIS — E785 Hyperlipidemia, unspecified: Secondary | ICD-10-CM | POA: Diagnosis not present

## 2017-02-06 DIAGNOSIS — L918 Other hypertrophic disorders of the skin: Secondary | ICD-10-CM

## 2017-02-06 DIAGNOSIS — N3000 Acute cystitis without hematuria: Secondary | ICD-10-CM

## 2017-02-06 DIAGNOSIS — R7989 Other specified abnormal findings of blood chemistry: Secondary | ICD-10-CM | POA: Diagnosis not present

## 2017-02-06 DIAGNOSIS — M8580 Other specified disorders of bone density and structure, unspecified site: Secondary | ICD-10-CM | POA: Diagnosis not present

## 2017-02-06 LAB — COMPREHENSIVE METABOLIC PANEL
ALK PHOS: 66 U/L (ref 39–117)
ALT: 57 U/L — ABNORMAL HIGH (ref 0–35)
AST: 60 U/L — ABNORMAL HIGH (ref 0–37)
Albumin: 4.3 g/dL (ref 3.5–5.2)
BUN: 16 mg/dL (ref 6–23)
CO2: 32 mEq/L (ref 19–32)
Calcium: 9.9 mg/dL (ref 8.4–10.5)
Chloride: 102 mEq/L (ref 96–112)
Creatinine, Ser: 0.97 mg/dL (ref 0.40–1.20)
GFR: 59.7 mL/min — ABNORMAL LOW (ref >60.00)
GLUCOSE: 119 mg/dL — AB (ref 70–99)
POTASSIUM: 4.3 meq/L (ref 3.5–5.1)
SODIUM: 140 meq/L (ref 135–145)
TOTAL PROTEIN: 7.6 g/dL (ref 6.0–8.3)
Total Bilirubin: 0.7 mg/dL (ref 0.2–1.2)

## 2017-02-06 LAB — LIPID PANEL
CHOL/HDL RATIO: 4
Cholesterol: 181 mg/dL (ref 0–200)
HDL: 51.2 mg/dL (ref >39.00)
NONHDL: 129.67
TRIGLYCERIDES: 260 mg/dL — AB (ref 0.0–149.0)
VLDL: 52 mg/dL — ABNORMAL HIGH (ref 0.0–40.0)

## 2017-02-06 LAB — VITAMIN D 25 HYDROXY (VIT D DEFICIENCY, FRACTURES): VITD: 27.65 ng/mL — ABNORMAL LOW (ref 30.00–100.00)

## 2017-02-06 LAB — CBC
HEMATOCRIT: 43.4 % (ref 36.0–46.0)
Hemoglobin: 14.2 g/dL (ref 12.0–15.0)
MCHC: 32.8 g/dL (ref 30.0–36.0)
MCV: 98.2 fl (ref 78.0–100.0)
Platelets: 250 10*3/uL (ref 150.0–400.0)
RBC: 4.42 Mil/uL (ref 3.87–5.11)
RDW: 14.3 % (ref 11.5–15.5)
WBC: 10.1 10*3/uL (ref 4.0–10.5)

## 2017-02-06 LAB — HEMOGLOBIN A1C: HEMOGLOBIN A1C: 7.2 % — AB (ref 4.6–6.5)

## 2017-02-06 LAB — TSH: TSH: 2.03 u[IU]/mL (ref 0.35–4.50)

## 2017-02-06 LAB — LDL CHOLESTEROL, DIRECT: LDL DIRECT: 85 mg/dL

## 2017-02-06 LAB — URIC ACID: URIC ACID, SERUM: 7.9 mg/dL — AB (ref 2.4–7.0)

## 2017-02-06 NOTE — Assessment & Plan Note (Addendum)
Encouraged to get adequate exercise, calcium and vitamin d intake.Dexa scan ordered. Takes Vitamin D gummies 1000 IU each day

## 2017-02-06 NOTE — Patient Instructions (Signed)
New shingles shot is called Shingrix 2 shots over 2-6 months at pharmacy Preventive Care 65 Years and Older, Female Preventive care refers to lifestyle choices and visits with your health care provider that can promote health and wellness. What does preventive care include?  A yearly physical exam. This is also called an annual well check.  Dental exams once or twice a year.  Routine eye exams. Ask your health care provider how often you should have your eyes checked.  Personal lifestyle choices, including: ? Daily care of your teeth and gums. ? Regular physical activity. ? Eating a healthy diet. ? Avoiding tobacco and drug use. ? Limiting alcohol use. ? Practicing safe sex. ? Taking low-dose aspirin every day. ? Taking vitamin and mineral supplements as recommended by your health care provider. What happens during an annual well check? The services and screenings done by your health care provider during your annual well check will depend on your age, overall health, lifestyle risk factors, and family history of disease. Counseling Your health care provider may ask you questions about your:  Alcohol use.  Tobacco use.  Drug use.  Emotional well-being.  Home and relationship well-being.  Sexual activity.  Eating habits.  History of falls.  Memory and ability to understand (cognition).  Work and work Statistician.  Reproductive health.  Screening You may have the following tests or measurements:  Height, weight, and BMI.  Blood pressure.  Lipid and cholesterol levels. These may be checked every 5 years, or more frequently if you are over 75 years old.  Skin check.  Lung cancer screening. You may have this screening every year starting at age 61 if you have a 30-pack-year history of smoking and currently smoke or have quit within the past 15 years.  Fecal occult blood test (FOBT) of the stool. You may have this test every year starting at age 70.  Flexible  sigmoidoscopy or colonoscopy. You may have a sigmoidoscopy every 5 years or a colonoscopy every 10 years starting at age 19.  Hepatitis C blood test.  Hepatitis B blood test.  Sexually transmitted disease (STD) testing.  Diabetes screening. This is done by checking your blood sugar (glucose) after you have not eaten for a while (fasting). You may have this done every 1-3 years.  Bone density scan. This is done to screen for osteoporosis. You may have this done starting at age 86.  Mammogram. This may be done every 1-2 years. Talk to your health care provider about how often you should have regular mammograms.  Talk with your health care provider about your test results, treatment options, and if necessary, the need for more tests. Vaccines Your health care provider may recommend certain vaccines, such as:  Influenza vaccine. This is recommended every year.  Tetanus, diphtheria, and acellular pertussis (Tdap, Td) vaccine. You may need a Td booster every 10 years.  Varicella vaccine. You may need this if you have not been vaccinated.  Zoster vaccine. You may need this after age 74.  Measles, mumps, and rubella (MMR) vaccine. You may need at least one dose of MMR if you were born in 1957 or later. You may also need a second dose.  Pneumococcal 13-valent conjugate (PCV13) vaccine. One dose is recommended after age 49.  Pneumococcal polysaccharide (PPSV23) vaccine. One dose is recommended after age 37.  Meningococcal vaccine. You may need this if you have certain conditions.  Hepatitis A vaccine. You may need this if you have certain conditions or if  you travel or work in places where you may be exposed to hepatitis A.  Hepatitis B vaccine. You may need this if you have certain conditions or if you travel or work in places where you may be exposed to hepatitis B.  Haemophilus influenzae type b (Hib) vaccine. You may need this if you have certain conditions.  Talk to your health  care provider about which screenings and vaccines you need and how often you need them. This information is not intended to replace advice given to you by your health care provider. Make sure you discuss any questions you have with your health care provider. Document Released: 02/12/2015 Document Revised: 10/06/2015 Document Reviewed: 11/17/2014 Elsevier Interactive Patient Education  [employer].

## 2017-02-06 NOTE — Assessment & Plan Note (Signed)
Check uric acid level today

## 2017-02-06 NOTE — Assessment & Plan Note (Signed)
hgba1c acceptable, minimize simple carbs. Increase exercise as tolerated. Continue current meds, foot exam today

## 2017-02-06 NOTE — Assessment & Plan Note (Addendum)
Patient encouraged to maintain heart healthy diet, regular exercise, adequate sleep. Consider daily probiotics. Take medications as prescribed. Given and reviewed copy of ACP documents from [employer] and encouraged to complete and return. Colonoscopy and MGM in 2018 utd. Does not have paps any longer had TAH in 1975 for non cancerous reasons.. Dexa scan ordered

## 2017-02-06 NOTE — Assessment & Plan Note (Signed)
Check vitamin D levels and monitor

## 2017-02-06 NOTE — Assessment & Plan Note (Signed)
Encouraged DASH diet, decrease po intake and increase exercise as tolerated. Needs 7-8 hours of sleep nightly. Avoid trans fats, eat small, frequent meals every 4-5 hours with lean proteins, complex carbs and healthy fats. Minimize simple carbs, GMO foods. 

## 2017-02-06 NOTE — Progress Notes (Signed)
Subjective:  I acted as a Education administrator for Dr. Charlett Blake. Amanda Crawford, [place]  Patient ID: Amanda Crawford, female    DOB: 06-24-1945, 72 y.o.   MRN: [id number]  No chief complaint on file.   HPI  Patient is in today for an annual exam and follow up on chronic medical concerns including chf, DM, reflux, gout and more . She mostly is doing well. She has many irritated skin tags she would like to have removed. She denies any recent febrile illness or hospitalizations. No polyuria or polydipsia. Doing well with ADLs. Denies CP/palp/SOB/HA/congestion/fevers/GI or GU c/o. Taking meds as prescribed  Patient Care Team: [name], MD as PCP - General (Family Medicine) Wyatt Portela, MD (Hematology and Oncology) Gaye Pollack, MD as Attending Physician (Cardiothoracic Surgery)   Past Medical History:  Diagnosis Date  . Allergic state 03/12/2015  . Arthritis   . Colon polyp 11/19/2014  . Dermatitis 03/12/2015  . Diarrhea 06/29/2016  . DM (diabetes mellitus), type 2 ([place]) 06/29/2016  . GERD (gastroesophageal reflux disease)    occ  . Gout 03/12/2015  . H/O measles   . H/O mumps   . Headache(784.0)    migraines  . History of chicken pox 11/23/2014  . Hyperglycemia 06/29/2016  . Lung cancer ([place])   . Migraine 11/23/2014  . Obesity 11/23/2014  . Pneumonia    child  . Preventative health care 09/12/2015  . Renal cell cancer ([place])    renal cell ca dx 9/01 and 8/08;  . Shortness of breath    occ  . Skin lesion of breast 03/12/2015    Past Surgical History:  Procedure Laterality Date  . ABDOMINAL HYSTERECTOMY    . APPENDECTOMY    . CARDIAC CATHETERIZATION     yrs ago neg  . CHOLECYSTECTOMY    . LUNG CANCER SURGERY Left 08  . RENAL MASS EXCISION Left 01  . RIGHT/LEFT HEART CATH AND CORONARY ANGIOGRAPHY N/A 07/27/2016   Procedure: Right/Left Heart Cath and Coronary Angiography;  Surgeon: Leonie Man, MD;  Location: [facility];  Service: Cardiovascular;  Laterality: N/A;  .  THORACOTOMY Left 05/09/2012   Procedure: THORACOTOMY MAJOR;  Surgeon: Gaye Pollack, MD;  Location: [facility];  Service: Thoracic;  Laterality: Left;  Marland Kitchen VIDEO BRONCHOSCOPY N/A 05/09/2012   Procedure: VIDEO BRONCHOSCOPY;  Surgeon: Gaye Pollack, MD;  Location: [facility];  Service: Thoracic;  Laterality: N/A;  . WEDGE RESECTION Left 05/09/2012   Procedure: LEFT UPPER LOBE WEDGE RESECTION;  Surgeon: Gaye Pollack, MD;  Location: [facility];  Service: Thoracic;  Laterality: Left;    Family History  Problem Relation Age of Onset  . Heart failure Father   . Asthma Brother   . Asthma Daughter     Social History   Socioeconomic History  . Marital status: Married    Spouse name: Not on file  . Number of children: Not on file  . Years of education: Not on file  . Highest education level: Not on file  Social Needs  . Financial resource strain: Not on file  . Food insecurity - worry: Not on file  . Food insecurity - inability: Not on file  . Transportation needs - medical: Not on file  . Transportation needs - non-medical: Not on file  Occupational History  . Not on file  Tobacco Use  . Smoking status: Former Smoker    Packs/day: 0.50    Years: 35.00    Pack years:  17.50    Types: Cigarettes    Last attempt to quit: 05/08/2006    Years since quitting: 10.7  . Smokeless tobacco: Never Used  Substance and Sexual Activity  . Alcohol use: No  . Drug use: No  . Sexual activity: Not on file    Comment: lives with husband, no dietary restrictions.   Other Topics Concern  . Not on file  Social History Narrative  . Not on file    Outpatient Medications Prior to Visit  Medication Sig Dispense Refill  . allopurinol (ZYLOPRIM) 100 MG tablet Take 2 tablets (200 mg total) by mouth daily. 60 tablet 3  . aspirin EC 81 MG tablet Take 81 mg by mouth daily.    Marland Kitchen atorvastatin (LIPITOR) 10 MG tablet TAKE 1 TABLET (10 MG TOTAL) BY MOUTH EVERY OTHER DAY. 45 tablet 0  . diphenhydramine-acetaminophen (TYLENOL  PM) 25-500 MG TABS Take 1 tablet by mouth at bedtime as needed. For insomnia    . famotidine (PEPCID) 20 MG tablet Take 20 mg by mouth at bedtime as needed for heartburn or indigestion.     . furosemide (LASIX) 40 MG tablet TAKE 1 TABLET BY MOUTH EVERY DAY 30 tablet 11  . glucosamine-chondroitin 500-400 MG tablet Take 1 tablet by mouth 2 (two) times a week.     . hyoscyamine (LEVSIN SL) 0.125 MG SL tablet Place 1 tablet (0.125 mg total) under the tongue every 4 (four) hours as needed. (Patient taking differently: Place 0.125 mg under the tongue every 4 (four) hours as needed for cramping. ) 30 tablet 1  . losartan (COZAAR) 25 MG tablet TAKE 0.5 TABLETS (12.5 MG TOTAL) BY MOUTH DAILY. 15 tablet 6  . meclizine (ANTIVERT) 12.5 MG tablet Take 1 tablet (12.5 mg total) by mouth 3 (three) times daily as needed for dizziness. As needed for   Dizziness or nausea 30 tablet 1  . Omega-3 Fatty Acids (FISH OIL) 1000 MG CAPS Take 1 capsule (1,000 mg total) by mouth 2 (two) times daily.  0  . omeprazole (PRILOSEC OTC) 20 MG tablet Take 20 mg by mouth daily as needed (acid reflux).     Marland Kitchen allopurinol (ZYLOPRIM) 100 MG tablet TAKE 1 TABLET (100 MG TOTAL) BY MOUTH DAILY. 30 tablet 3  . amoxicillin-clavulanate (AUGMENTIN) 875-125 MG tablet Take 1 tablet by mouth 2 (two) times daily. 10 tablet 0  . colchicine 0.6 MG tablet 2 tabs po once then 1 tab every 2 hours til resolved or max of 6 tabs in 24 hours. 6 tablet 2  . carvedilol (COREG) 6.25 MG tablet Take 1 tablet (6.25 mg total) by mouth 2 (two) times daily. 180 tablet 1  . Vitamin D, Ergocalciferol, (DRISDOL) 50000 units CAPS capsule TAKE 1 CAPSULE (50,000 UNITS TOTAL) BY MOUTH EVERY 7 (SEVEN) DAYS. TAKE FOR 12 WEEKS (Patient not taking: Reported on 02/06/2017) 4 capsule 4   No facility-administered medications prior to visit.     No Known Allergies  Review of Systems  Constitutional: Negative for chills, fever and malaise/fatigue.  HENT: Negative for congestion  and hearing loss.   Eyes: Negative for discharge.  Respiratory: Negative for cough, sputum production and shortness of breath.   Cardiovascular: Negative for chest pain, palpitations and leg swelling.  Gastrointestinal: Negative for abdominal pain, blood in stool, constipation, diarrhea, heartburn, nausea and vomiting.  Genitourinary: Negative for dysuria, frequency, hematuria and urgency.  Musculoskeletal: Negative for back pain, falls and myalgias.  Skin: Positive for rash.  Neurological: Negative for  dizziness, sensory change, loss of consciousness, weakness and headaches.  Endo/Heme/Allergies: Negative for environmental allergies. Does not bruise/bleed easily.  Psychiatric/Behavioral: Negative for depression and suicidal ideas. The patient is not nervous/anxious and does not have insomnia.        Objective:    Physical Exam  Constitutional: She is oriented to person, place, and time. She appears well-developed and well-nourished. No distress.  HENT:  Head: Normocephalic and atraumatic.  Eyes: Conjunctivae are normal.  Neck: Neck supple. No thyromegaly present.  Cardiovascular: Normal rate, regular rhythm and normal heart sounds.  No murmur heard. Pulmonary/Chest: Effort normal and breath sounds normal. No respiratory distress.  Abdominal: Soft. Bowel sounds are normal. She exhibits no distension and no mass. There is no tenderness.  Musculoskeletal: She exhibits no edema.  Lymphadenopathy:    She has no cervical adenopathy.  Neurological: She is alert and oriented to person, place, and time.  Skin: Skin is warm and dry.  Diffuse skin tags throughout.   Psychiatric: She has a normal mood and affect. Her behavior is normal.    BP 102/62 (BP Location: Left Arm, Patient Position: Sitting, Cuff Size: Normal)   Pulse 69   Temp 98 F (36.7 C) (Oral)   Resp 18   Ht 5' 4"  (1.626 m)   Wt 220 lb 9.6 oz (100.1 kg)   SpO2 95%   BMI 37.87 kg/m  Wt Readings from Last 3 Encounters:    02/06/17 220 lb 9.6 oz (100.1 kg)  11/08/16 224 lb (101.6 kg)  11/03/16 222 lb 3.2 oz (100.8 kg)   BP Readings from Last 3 Encounters:  02/06/17 102/62  11/08/16 132/70  11/03/16 (!) 135/51     Immunization History  Administered Date(s) Administered  . Influenza, High Dose Seasonal PF 10/27/2016  . Influenza, Seasonal, Injecte, Preservative Fre 11/21/2014  . Influenza-Unspecified 11/21/2014  . Pneumococcal Conjugate-13 03/12/2015  . Pneumococcal Polysaccharide-23 10/23/2016  . Td 08/31/2015  . Tdap 08/31/2015    Health Maintenance  Topic Date Due  . FOOT EXAM  04/30/1953  . OPHTHALMOLOGY EXAM  04/18/2017  . HEMOGLOBIN A1C  08/06/2017  . MAMMOGRAM  03/09/2018  . TETANUS/TDAP  08/30/2025  . COLONOSCOPY  10/04/2026  . INFLUENZA VACCINE  Completed  . DEXA SCAN  Completed  . Hepatitis C Screening  Completed  . PNA vac Low Risk Adult  Completed    Lab Results  Component Value Date   WBC 10.1 02/06/2017   HGB 14.2 02/06/2017   HCT 43.4 02/06/2017   PLT 250.0 02/06/2017   GLUCOSE 119 (H) 02/06/2017   CHOL 181 02/06/2017   TRIG 260.0 (H) 02/06/2017   HDL 51.20 02/06/2017   LDLDIRECT 85.0 02/06/2017   LDLCALC (H) 01/31/2007    117        Total Cholesterol/HDL:CHD Risk Coronary Heart Disease Risk Table                     Men   Women  1/2 Average Risk   3.4   3.3  Average Risk       5.0   4.4  2 X Average Risk   9.6   7.1  3 X Average Risk  23.4   11.0        Use the calculated Patient Ratio above and the CHD Risk Table to determine the patient's CHD Risk.        ATP III CLASSIFICATION (LDL):  <100     mg/dL   Optimal  100-129  mg/dL   Near or Above                    Optimal  130-159  mg/dL   Borderline  160-189  mg/dL   High  >190     mg/dL   Very High   ALT 57 (H) 02/06/2017   AST 60 (H) 02/06/2017   NA 140 02/06/2017   K 4.3 02/06/2017   CL 102 02/06/2017   CREATININE 0.97 02/06/2017   BUN 16 02/06/2017   CO2 32 02/06/2017   TSH 2.03 02/06/2017    INR 1.0 07/24/2016   HGBA1C 7.2 (H) 02/06/2017    Lab Results  Component Value Date   TSH 2.03 02/06/2017   Lab Results  Component Value Date   WBC 10.1 02/06/2017   HGB 14.2 02/06/2017   HCT 43.4 02/06/2017   MCV 98.2 02/06/2017   PLT 250.0 02/06/2017   Lab Results  Component Value Date   NA 140 02/06/2017   K 4.3 02/06/2017   CHLORIDE 104 11/01/2016   CO2 32 02/06/2017   GLUCOSE 119 (H) 02/06/2017   BUN 16 02/06/2017   CREATININE 0.97 02/06/2017   BILITOT 0.7 02/06/2017   ALKPHOS 66 02/06/2017   AST 60 (H) 02/06/2017   ALT 57 (H) 02/06/2017   PROT 7.6 02/06/2017   ALBUMIN 4.3 02/06/2017   CALCIUM 9.9 02/06/2017   ANIONGAP 11 11/01/2016   EGFR 50 (L) 11/01/2016   GFR 59.70 (L) 02/06/2017   Lab Results  Component Value Date   CHOL 181 02/06/2017   Lab Results  Component Value Date   HDL 51.20 02/06/2017   Lab Results  Component Value Date   LDLCALC (H) 01/31/2007    117        Total Cholesterol/HDL:CHD Risk Coronary Heart Disease Risk Table                     Men   Women  1/2 Average Risk   3.4   3.3  Average Risk       5.0   4.4  2 X Average Risk   9.6   7.1  3 X Average Risk  23.4   11.0        Use the calculated Patient Ratio above and the CHD Risk Table to determine the patient's CHD Risk.        ATP III CLASSIFICATION (LDL):  <100     mg/dL   Optimal  100-129  mg/dL   Near or Above                    Optimal  130-159  mg/dL   Borderline  160-189  mg/dL   High  >190     mg/dL   Very High   Lab Results  Component Value Date   TRIG 260.0 (H) 02/06/2017   Lab Results  Component Value Date   CHOLHDL 4 02/06/2017   Lab Results  Component Value Date   HGBA1C 7.2 (H) 02/06/2017         Assessment & Plan:   Problem List Items Addressed This Visit    Morbid obesity ([place]) (Chronic)    Encouraged DASH diet, decrease po intake and increase exercise as tolerated. Needs 7-8 hours of sleep nightly. Avoid trans fats, eat small, frequent  meals every 4-5 hours with lean proteins, complex carbs and healthy fats. Minimize simple carbs, GMO foods.      Hyperlipidemia  Tolerating statin, encouraged heart healthy diet, avoid trans fats, minimize simple carbs and saturated fats. Increase exercise as tolerated      Relevant Orders   Lipid panel (Completed)   Osteopenia    Encouraged to get adequate exercise, calcium and vitamin d intake.Dexa scan ordered. Takes Vitamin D gummies 1000 IU each day      Relevant Orders   DG Bone Density (Completed)   Low serum vitamin D    Check vitamin D levels and monitor      Relevant Orders   Comprehensive metabolic panel (Completed)   VITAMIN D 25 Hydroxy (Vit-D Deficiency, Fractures) (Completed)   Gout    Check uric acid level today      Relevant Orders   Uric acid (Completed)   Preventative health care    Patient encouraged to maintain heart healthy diet, regular exercise, adequate sleep. Consider daily probiotics. Take medications as prescribed. Given and reviewed copy of ACP documents from [employer] and encouraged to complete and return. Colonoscopy and MGM in 2018 utd. Does not have paps any longer had TAH in 1975 for non cancerous reasons.. Dexa scan ordered      Relevant Orders   CBC (Completed)   TSH (Completed)   DM (diabetes mellitus), type 2 ([facility])    hgba1c acceptable, minimize simple carbs. Increase exercise as tolerated. Continue current meds, foot exam today      Relevant Orders   Hemoglobin A1c (Completed)   UTI (urinary tract infection)    With some intermittent incontinence. She will consider pelvic floor PT if worsens       Other Visit Diagnoses    Skin tags, multiple acquired    -  Primary   Relevant Orders   Ambulatory referral to Dermatology      I have discontinued Ileana Ladd. Grizzell's colchicine and amoxicillin-clavulanate. I am also having her maintain her diphenhydramine-acetaminophen, glucosamine-chondroitin, meclizine, omeprazole,  famotidine, Vitamin D (Ergocalciferol), hyoscyamine, aspirin EC, Fish Oil, carvedilol, allopurinol, losartan, furosemide, and atorvastatin.  No orders of the defined types were placed in this encounter.   CMA served as Education administrator during this visit. History, Physical and Plan performed by medical provider. Documentation and orders reviewed and attested to.  Penni Homans, MD

## 2017-02-06 NOTE — Assessment & Plan Note (Signed)
Tolerating statin, encouraged heart healthy diet, avoid trans fats, minimize simple carbs and saturated fats. Increase exercise as tolerated 

## 2017-02-07 ENCOUNTER — Ambulatory Visit (HOSPITAL_BASED_OUTPATIENT_CLINIC_OR_DEPARTMENT_OTHER)
Admission: RE | Admit: 2017-02-07 | Discharge: 2017-02-07 | Disposition: A | Discharge status: Home / Self Care | Payer: Medicare HMO | Source: Ambulatory Visit | Attending: Family Medicine | Admitting: Family Medicine

## 2017-02-07 DIAGNOSIS — E2839 Other primary ovarian failure: Secondary | ICD-10-CM | POA: Insufficient documentation

## 2017-02-07 DIAGNOSIS — Z780 Asymptomatic menopausal state: Secondary | ICD-10-CM | POA: Diagnosis not present

## 2017-02-07 DIAGNOSIS — M8580 Other specified disorders of bone density and structure, unspecified site: Secondary | ICD-10-CM

## 2017-02-07 DIAGNOSIS — M85851 Other specified disorders of bone density and structure, right thigh: Secondary | ICD-10-CM | POA: Diagnosis not present

## 2017-02-08 DIAGNOSIS — L918 Other hypertrophic disorders of the skin: Secondary | ICD-10-CM | POA: Insufficient documentation

## 2017-02-08 NOTE — Assessment & Plan Note (Signed)
With some intermittent incontinence. She will consider pelvic floor PT if worsens

## 2017-02-08 NOTE — Assessment & Plan Note (Signed)
Numerous, many are irritated. Referred to [facility]

## 2017-02-12 MED ORDER — ALLOPURINOL 300 MG PO TABS: 300.0000 mg | ORAL_TABLET | Freq: Every day | ORAL | 3 refills | Status: DC

## 2017-02-12 MED ORDER — VITAMIN D (ERGOCALCIFEROL) 1.25 MG (50000 UNIT) PO CAPS: 50000.0000 [IU] | ORAL_CAPSULE | ORAL | 4 refills | Status: DC

## 2017-02-12 NOTE — Addendum Note (Signed)
Addended by: Magdalene Molly A on: 02/12/2017 03:16 PM   Modules accepted: Orders

## 2017-03-09 ENCOUNTER — Other Ambulatory Visit: Payer: Self-pay | Admitting: Physician Assistant

## 2017-03-09 NOTE — Telephone Encounter (Signed)
REFILL 

## 2017-03-09 NOTE — Telephone Encounter (Signed)
This is Dr. Hilty's pt 

## 2017-03-09 NOTE — Telephone Encounter (Signed)
Please review for refill, Thanks !  

## 2017-03-20 DIAGNOSIS — B078 Other viral warts: Secondary | ICD-10-CM | POA: Diagnosis not present

## 2017-03-20 DIAGNOSIS — Z23 Encounter for immunization: Secondary | ICD-10-CM | POA: Diagnosis not present

## 2017-03-20 DIAGNOSIS — L918 Other hypertrophic disorders of the skin: Secondary | ICD-10-CM | POA: Diagnosis not present

## 2017-03-20 DIAGNOSIS — L821 Other seborrheic keratosis: Secondary | ICD-10-CM | POA: Diagnosis not present

## 2017-03-22 DIAGNOSIS — E785 Hyperlipidemia, unspecified: Secondary | ICD-10-CM | POA: Diagnosis not present

## 2017-03-22 DIAGNOSIS — Z809 Family history of malignant neoplasm, unspecified: Secondary | ICD-10-CM | POA: Diagnosis not present

## 2017-03-22 DIAGNOSIS — K219 Gastro-esophageal reflux disease without esophagitis: Secondary | ICD-10-CM | POA: Diagnosis not present

## 2017-03-22 DIAGNOSIS — E669 Obesity, unspecified: Secondary | ICD-10-CM | POA: Diagnosis not present

## 2017-03-22 DIAGNOSIS — I10 Essential (primary) hypertension: Secondary | ICD-10-CM | POA: Diagnosis not present

## 2017-03-22 DIAGNOSIS — Z7982 Long term (current) use of aspirin: Secondary | ICD-10-CM | POA: Diagnosis not present

## 2017-03-22 DIAGNOSIS — R609 Edema, unspecified: Secondary | ICD-10-CM | POA: Diagnosis not present

## 2017-03-22 DIAGNOSIS — Z6838 Body mass index (BMI) 38.0-38.9, adult: Secondary | ICD-10-CM | POA: Diagnosis not present

## 2017-03-22 DIAGNOSIS — R32 Unspecified urinary incontinence: Secondary | ICD-10-CM | POA: Diagnosis not present

## 2017-03-22 DIAGNOSIS — M109 Gout, unspecified: Secondary | ICD-10-CM | POA: Diagnosis not present

## 2017-04-11 ENCOUNTER — Other Ambulatory Visit: Payer: Self-pay | Admitting: Family Medicine

## 2017-04-20 DIAGNOSIS — H25013 Cortical age-related cataract, bilateral: Secondary | ICD-10-CM | POA: Diagnosis not present

## 2017-04-20 DIAGNOSIS — H35373 Puckering of macula, bilateral: Secondary | ICD-10-CM | POA: Diagnosis not present

## 2017-04-20 DIAGNOSIS — H40013 Open angle with borderline findings, low risk, bilateral: Secondary | ICD-10-CM | POA: Diagnosis not present

## 2017-04-20 DIAGNOSIS — H2513 Age-related nuclear cataract, bilateral: Secondary | ICD-10-CM | POA: Diagnosis not present

## 2017-05-07 ENCOUNTER — Telehealth: Payer: Self-pay | Admitting: *Deleted

## 2017-05-07 ENCOUNTER — Ambulatory Visit (HOSPITAL_COMMUNITY): Payer: Medicare HMO

## 2017-05-07 ENCOUNTER — Inpatient Hospital Stay: Payer: Medicare HMO | Attending: Oncology

## 2017-05-07 ENCOUNTER — Encounter: Payer: Self-pay | Admitting: *Deleted

## 2017-05-07 DIAGNOSIS — Z85528 Personal history of other malignant neoplasm of kidney: Secondary | ICD-10-CM | POA: Insufficient documentation

## 2017-05-07 DIAGNOSIS — C649 Malignant neoplasm of unspecified kidney, except renal pelvis: Secondary | ICD-10-CM

## 2017-05-07 LAB — CBC WITH DIFFERENTIAL/PLATELET
Basophils Absolute: 0.1 10*3/uL (ref 0.0–0.1)
Basophils Relative: 1 %
Eosinophils Absolute: 0.2 10*3/uL (ref 0.0–0.5)
Eosinophils Relative: 2 %
HEMATOCRIT: 41.1 % (ref 34.8–46.6)
HEMOGLOBIN: 13.6 g/dL (ref 11.6–15.9)
LYMPHS ABS: 2.1 10*3/uL (ref 0.9–3.3)
LYMPHS PCT: 28 %
MCH: 32.4 pg (ref 25.1–34.0)
MCHC: 33.2 g/dL (ref 31.5–36.0)
MCV: 97.7 fL (ref 79.5–101.0)
Monocytes Absolute: 0.4 10*3/uL (ref 0.1–0.9)
Monocytes Relative: 6 %
NEUTROS PCT: 63 %
Neutro Abs: 4.9 10*3/uL (ref 1.5–6.5)
Platelets: 236 10*3/uL (ref 145–400)
RBC: 4.21 MIL/uL (ref 3.70–5.45)
RDW: 15 % — ABNORMAL HIGH (ref 11.2–14.5)
WBC: 7.7 10*3/uL (ref 3.9–10.3)

## 2017-05-07 LAB — COMPREHENSIVE METABOLIC PANEL
ALT: 85 U/L — ABNORMAL HIGH (ref 0–55)
AST: 95 U/L — AB (ref 5–34)
Albumin: 3.7 g/dL (ref 3.5–5.0)
Alkaline Phosphatase: 76 U/L (ref 40–150)
Anion gap: 10 (ref 3–11)
BUN: 19 mg/dL (ref 7–26)
CHLORIDE: 108 mmol/L (ref 98–109)
CO2: 22 mmol/L (ref 22–29)
Calcium: 10.1 mg/dL (ref 8.4–10.4)
Creatinine, Ser: 0.99 mg/dL (ref 0.60–1.10)
GFR, EST NON AFRICAN AMERICAN: 55 mL/min — AB (ref >60)
Glucose, Bld: 159 mg/dL — ABNORMAL HIGH (ref 70–140)
POTASSIUM: 4.3 mmol/L (ref 3.5–5.1)
SODIUM: 140 mmol/L (ref 136–145)
Total Bilirubin: 0.6 mg/dL (ref 0.2–1.2)
Total Protein: 7.4 g/dL (ref 6.4–8.3)

## 2017-05-07 NOTE — Telephone Encounter (Signed)
Patient in waiting room, states she came for labs today and went to have her CT scan and was told that it was cancelled.

## 2017-05-08 ENCOUNTER — Encounter: Payer: Self-pay | Admitting: *Deleted

## 2017-05-10 ENCOUNTER — Ambulatory Visit: Payer: Medicare HMO | Admitting: Oncology

## 2017-05-11 ENCOUNTER — Telehealth: Payer: Self-pay | Admitting: *Deleted

## 2017-05-11 ENCOUNTER — Telehealth: Payer: Self-pay

## 2017-05-11 ENCOUNTER — Encounter: Payer: Self-pay | Admitting: *Deleted

## 2017-05-11 ENCOUNTER — Other Ambulatory Visit: Payer: Self-pay | Admitting: *Deleted

## 2017-05-11 NOTE — Telephone Encounter (Signed)
NON LOS was in basket request per 4/10

## 2017-05-11 NOTE — Telephone Encounter (Signed)
Patient calling to ask why her CT scan has not been approved. Spoke with [place] in managed care. Per dr Alen Blew okay to schedule her for first available. Los to [employer].

## 2017-05-11 NOTE — Telephone Encounter (Signed)
Called and spoke with patient concerning upcoming appointment, will mail letter and calender of these appointment. Per 4/10 in basket

## 2017-05-11 NOTE — Telephone Encounter (Signed)
Moved appointment due to provider block day. Per 4/10 los

## 2017-05-11 NOTE — Progress Notes (Signed)
Spoke with patient, schedulers will be calling her for a f/u with dr Alen Blew, so we may get her CT scan scheduled

## 2017-05-21 ENCOUNTER — Ambulatory Visit (HOSPITAL_COMMUNITY): Payer: Medicare HMO | Attending: Internal Medicine

## 2017-05-21 ENCOUNTER — Other Ambulatory Visit: Payer: Self-pay

## 2017-05-21 DIAGNOSIS — E119 Type 2 diabetes mellitus without complications: Secondary | ICD-10-CM | POA: Insufficient documentation

## 2017-05-21 DIAGNOSIS — R0602 Shortness of breath: Secondary | ICD-10-CM | POA: Diagnosis not present

## 2017-05-21 DIAGNOSIS — I428 Other cardiomyopathies: Secondary | ICD-10-CM | POA: Insufficient documentation

## 2017-06-04 ENCOUNTER — Ambulatory Visit: Payer: Medicare HMO | Admitting: Oncology

## 2017-06-04 ENCOUNTER — Other Ambulatory Visit: Payer: Medicare HMO

## 2017-06-06 ENCOUNTER — Inpatient Hospital Stay: Payer: Medicare HMO | Attending: Oncology | Admitting: Oncology

## 2017-06-06 ENCOUNTER — Telehealth: Payer: Self-pay | Admitting: Internal Medicine

## 2017-06-06 ENCOUNTER — Telehealth: Payer: Self-pay

## 2017-06-06 ENCOUNTER — Inpatient Hospital Stay: Payer: Medicare HMO

## 2017-06-06 VITALS — BP 145/74 | HR 93 | Temp 98.9°F | Resp 20 | Ht 64.0 in | Wt 221.6 lb

## 2017-06-06 DIAGNOSIS — Z79899 Other long term (current) drug therapy: Secondary | ICD-10-CM

## 2017-06-06 DIAGNOSIS — Z85528 Personal history of other malignant neoplasm of kidney: Secondary | ICD-10-CM | POA: Diagnosis not present

## 2017-06-06 DIAGNOSIS — R7989 Other specified abnormal findings of blood chemistry: Secondary | ICD-10-CM

## 2017-06-06 DIAGNOSIS — R945 Abnormal results of liver function studies: Secondary | ICD-10-CM

## 2017-06-06 DIAGNOSIS — Z7982 Long term (current) use of aspirin: Secondary | ICD-10-CM

## 2017-06-06 DIAGNOSIS — R0602 Shortness of breath: Secondary | ICD-10-CM | POA: Diagnosis not present

## 2017-06-06 DIAGNOSIS — C649 Malignant neoplasm of unspecified kidney, except renal pelvis: Secondary | ICD-10-CM

## 2017-06-06 NOTE — Telephone Encounter (Signed)
Printed avs and calender of upcoming appointment. Per 5/8 los

## 2017-06-06 NOTE — Telephone Encounter (Signed)
Called and LVM to call back to schedule.

## 2017-06-06 NOTE — Progress Notes (Signed)
Hematology and Oncology Follow Up Visit  Amanda Crawford [id number] [birth year] 72 y.o. 06/06/2017 8:40 AM  CC: Nicanor Alcon, M.D.  Priscille Heidelberg Little, M.D.    Principle Diagnosis: 72 year old woman with T2N0 renal cell carcinoma diagnosed in 2001.  She had lung recurrence in 2 separate occasions that have been resected.  She remains a stage IV without any active disease.  Prior Therapy:  She is status post left nephrectomy in 2001.  She developed a left lingular mass, status post VATS procedure.  Pathology revealed a recurrent renal cell carcinoma resected in October 2008.   She developed relapse disease in 2014 and subsequently underwent a VATS and left upper lobe wedge resection of a tumor done on 05/09/2012. Pathology confirmed another relapse.  Current therapy: Active surveillance.  Interim History: Amanda Crawford is here for a follow-up.  Since her last visit she is reporting a few complaints.  She is reporting more dyspnea on exertion and occasional cough.  She reports no hemoptysis or or wheezing.  She denies any chest pain.  Her appetite remain excellent without any changes in her ability to perform activities of daily living.  She denies any abdominal pain or change in her bowel habits.  Her performance status remained unchanged.  She does not report any headaches, blurry vision, syncope or seizures. She does not report any fevers, chills or sweats.  He denies any weight loss or appetite changes.  She does not report any chest pain, palpitation, orthopnea or leg edema.  She has not reported any  abdominal pain or diarrhea. She has not reported any melena or hematochezia. She does not report any frequency urgency or hesitancy. She does not report any skeletal complaints. Does not report any lymphadenopathy or petechiae. Rest of the review of systems is negative.   Medications: I have reviewed the patient's current medications. Current Outpatient Medications  Medication Sig Dispense Refill   . allopurinol (ZYLOPRIM) 300 MG tablet Take 1 tablet (300 mg total) by mouth daily. 30 tablet 3  . aspirin EC 81 MG tablet Take 81 mg by mouth daily.    Marland Kitchen atorvastatin (LIPITOR) 10 MG tablet TAKE 1 TABLET (10 MG TOTAL) BY MOUTH EVERY OTHER DAY. 45 tablet 0  . carvedilol (COREG) 6.25 MG tablet TAKE 1 TABLET BY MOUTH TWICE A DAY 180 tablet 0  . diphenhydramine-acetaminophen (TYLENOL PM) 25-500 MG TABS Take 1 tablet by mouth at bedtime as needed. For insomnia    . famotidine (PEPCID) 20 MG tablet Take 20 mg by mouth at bedtime as needed for heartburn or indigestion.     . furosemide (LASIX) 40 MG tablet TAKE 1 TABLET BY MOUTH EVERY DAY 30 tablet 11  . glucosamine-chondroitin 500-400 MG tablet Take 1 tablet by mouth 2 (two) times a week.     . hyoscyamine (LEVSIN SL) 0.125 MG SL tablet Place 1 tablet (0.125 mg total) under the tongue every 4 (four) hours as needed. (Patient taking differently: Place 0.125 mg under the tongue every 4 (four) hours as needed for cramping. ) 30 tablet 1  . losartan (COZAAR) 25 MG tablet TAKE 0.5 TABLETS (12.5 MG TOTAL) BY MOUTH DAILY. 15 tablet 6  . meclizine (ANTIVERT) 12.5 MG tablet Take 1 tablet (12.5 mg total) by mouth 3 (three) times daily as needed for dizziness. As needed for   Dizziness or nausea 30 tablet 1  . Omega-3 Fatty Acids (FISH OIL) 1000 MG CAPS Take 1 capsule (1,000 mg total) by mouth 2 (two) times  daily.  0  . omeprazole (PRILOSEC OTC) 20 MG tablet Take 20 mg by mouth daily as needed (acid reflux).     . Vitamin D, Ergocalciferol, (DRISDOL) 50000 units CAPS capsule Take 1 capsule (50,000 Units total) by mouth every 7 (seven) days. 4 capsule 4   No current facility-administered medications for this visit.      Allergies: No Known Allergies  Past Medical History, Surgical history, Social history, and Family History remained unchanged on review.   Physical Exam: Blood pressure (!) 145/74, pulse 93, temperature 98.9 F (37.2 C), temperature source  Oral, resp. rate 20, height 5\' 4"  (1.626 m), weight 221 lb 9.6 oz (100.5 kg), SpO2 97 %.   ECOG: 1 General appearance: Alert, awake woman without distress. Head: Atraumatic without abnormalities. Oropharynx: Without any thrush or ulcers. Eyes: No scleral icterus. Lymph nodes: No lymphadenopathy palpated in the cervical, supraclavicular, and axillary nodes. Heart:regular rate and rhythm, without murmurs or gallops. Lung: Clear to auscultation without any rhonchi, wheezes or dullness to percussion Abdomin: Soft, nontender without rebound or guarding. Musculoskeletal: No joint deformity or effusion. Skin: No rashes or lesions.  Lab Results: Lab Results  Component Value Date   WBC 7.7 05/07/2017   HGB 13.6 05/07/2017   HCT 41.1 05/07/2017   MCV 97.7 05/07/2017   PLT 236 05/07/2017     Chemistry      Component Value Date/Time   NA 140 05/07/2017 0824   NA 140 11/01/2016 0818   K 4.3 05/07/2017 0824   K 4.4 11/01/2016 0818   CL 108 05/07/2017 0824   CL 107 04/16/2012 0923   CO2 22 05/07/2017 0824   CO2 26 11/01/2016 0818   BUN 19 05/07/2017 0824   BUN 17.9 11/01/2016 0818   CREATININE 0.99 05/07/2017 0824   CREATININE 1.1 11/01/2016 0818      Component Value Date/Time   CALCIUM 10.1 05/07/2017 0824   CALCIUM 10.3 11/01/2016 0818   ALKPHOS 76 05/07/2017 0824   ALKPHOS 72 11/01/2016 0818   AST 95 (H) 05/07/2017 0824   AST 75 (H) 11/01/2016 0818   ALT 85 (H) 05/07/2017 0824   ALT 75 (H) 11/01/2016 0818   BILITOT 0.6 05/07/2017 0824   BILITOT 0.78 11/01/2016 0818      Impression and plan:  72 year old woman with the following issues:  1.  Renal cell carcinoma diagnosed in 2001.  She is status post nephrectomy followed by lung resection on 2 separate occasions the most recent of which in 2014.  She remains disease-free at this time based on her last CT scan in October 2018.  She has had multiple recurrences in her lung and now she is experiencing respiratory symptoms  which necessitates obtaining imaging studies of the chest to rule out cancer recurrence.  We will obtain the CT scan in the immediate future to rule out these etiologies.   2.  Elevated liver function test: Remain elevated today.  We will obtain imaging studies of the abdomen and pelvis to rule out metastatic disease to the liver.  3.  Dyspnea on exertion: Malignancy needs to be ruled out at this time.  Other etiologies include deconditioning or sleep apnea.  4.  Follow-up: We will be in 6 months sooner if needed to pending on the results of her scans.  15  minutes was spent with the patient face-to-face today.  More than 50% of time was dedicated to patient counseling, education and coordination of her care.    Zola Button, MD  5/8/20198:40 AM

## 2017-06-07 ENCOUNTER — Encounter: Payer: Self-pay | Admitting: Family Medicine

## 2017-06-07 ENCOUNTER — Ambulatory Visit (INDEPENDENT_AMBULATORY_CARE_PROVIDER_SITE_OTHER): Payer: Medicare HMO | Admitting: Family Medicine

## 2017-06-07 DIAGNOSIS — R109 Unspecified abdominal pain: Secondary | ICD-10-CM | POA: Diagnosis not present

## 2017-06-07 DIAGNOSIS — M8580 Other specified disorders of bone density and structure, unspecified site: Secondary | ICD-10-CM

## 2017-06-07 DIAGNOSIS — L309 Dermatitis, unspecified: Secondary | ICD-10-CM

## 2017-06-07 DIAGNOSIS — R7989 Other specified abnormal findings of blood chemistry: Secondary | ICD-10-CM

## 2017-06-07 DIAGNOSIS — C649 Malignant neoplasm of unspecified kidney, except renal pelvis: Secondary | ICD-10-CM

## 2017-06-07 DIAGNOSIS — E785 Hyperlipidemia, unspecified: Secondary | ICD-10-CM

## 2017-06-07 DIAGNOSIS — K219 Gastro-esophageal reflux disease without esophagitis: Secondary | ICD-10-CM | POA: Diagnosis not present

## 2017-06-07 DIAGNOSIS — M1A9XX0 Chronic gout, unspecified, without tophus (tophi): Secondary | ICD-10-CM | POA: Diagnosis not present

## 2017-06-07 DIAGNOSIS — E119 Type 2 diabetes mellitus without complications: Secondary | ICD-10-CM | POA: Diagnosis not present

## 2017-06-07 DIAGNOSIS — E559 Vitamin D deficiency, unspecified: Secondary | ICD-10-CM | POA: Diagnosis not present

## 2017-06-07 LAB — CBC
HEMATOCRIT: 42.8 % (ref 36.0–46.0)
HEMOGLOBIN: 14.2 g/dL (ref 12.0–15.0)
MCHC: 33.3 g/dL (ref 30.0–36.0)
MCV: 98 fl (ref 78.0–100.0)
PLATELETS: 248 10*3/uL (ref 150.0–400.0)
RBC: 4.37 Mil/uL (ref 3.87–5.11)
RDW: 14.4 % (ref 11.5–15.5)
WBC: 8.5 10*3/uL (ref 4.0–10.5)

## 2017-06-07 LAB — LIPID PANEL
CHOL/HDL RATIO: 5
Cholesterol: 232 mg/dL — ABNORMAL HIGH (ref 0–200)
HDL: 44.8 mg/dL (ref >39.00)
NonHDL: 187.56
TRIGLYCERIDES: 336 mg/dL — AB (ref 0.0–149.0)
VLDL: 67.2 mg/dL — AB (ref 0.0–40.0)

## 2017-06-07 LAB — VITAMIN D 25 HYDROXY (VIT D DEFICIENCY, FRACTURES): VITD: 40.47 ng/mL (ref 30.00–100.00)

## 2017-06-07 LAB — COMPREHENSIVE METABOLIC PANEL
ALT: 62 U/L — ABNORMAL HIGH (ref 0–35)
AST: 78 U/L — ABNORMAL HIGH (ref 0–37)
Albumin: 4.1 g/dL (ref 3.5–5.2)
Alkaline Phosphatase: 60 U/L (ref 39–117)
BUN: 15 mg/dL (ref 6–23)
CALCIUM: 10 mg/dL (ref 8.4–10.5)
CO2: 28 meq/L (ref 19–32)
Chloride: 101 mEq/L (ref 96–112)
Creatinine, Ser: 0.94 mg/dL (ref 0.40–1.20)
GFR: 61.85 mL/min (ref >60.00)
Glucose, Bld: 122 mg/dL — ABNORMAL HIGH (ref 70–99)
POTASSIUM: 4.3 meq/L (ref 3.5–5.1)
Sodium: 137 mEq/L (ref 135–145)
TOTAL PROTEIN: 7.6 g/dL (ref 6.0–8.3)
Total Bilirubin: 0.7 mg/dL (ref 0.2–1.2)

## 2017-06-07 LAB — LDL CHOLESTEROL, DIRECT: Direct LDL: 122 mg/dL

## 2017-06-07 LAB — TSH: TSH: 2.64 u[IU]/mL (ref 0.35–4.50)

## 2017-06-07 LAB — URIC ACID: Uric Acid, Serum: 5.7 mg/dL (ref 2.4–7.0)

## 2017-06-07 LAB — HEMOGLOBIN A1C: Hgb A1c MFr Bld: 7.4 % — ABNORMAL HIGH (ref 4.6–6.5)

## 2017-06-07 MED ORDER — ATORVASTATIN CALCIUM 10 MG PO TABS: 10.0000 mg | ORAL_TABLET | ORAL | 1 refills | Status: DC

## 2017-06-07 MED ORDER — NYSTATIN 100000 UNIT/GM EX CREA: 1.0000 "application " | TOPICAL_CREAM | Freq: Two times a day (BID) | CUTANEOUS | 1 refills | Status: DC

## 2017-06-07 NOTE — Assessment & Plan Note (Signed)
Taking weekly vitamin D 50000 IU weekly, check level

## 2017-06-07 NOTE — Assessment & Plan Note (Signed)
Is following with Dr Alen Blew her oncologist and is having trouble getting her insurance to cover her CT scan for monitoring her disease progression.

## 2017-06-07 NOTE — Assessment & Plan Note (Signed)
Happens roughly weekly hyoscyamine helps when needed. Take Miralax and Beneifber together once or twice daily

## 2017-06-07 NOTE — Assessment & Plan Note (Signed)
Tolerating statin, encouraged heart healthy diet, avoid trans fats, minimize simple carbs and saturated fats. Increase exercise as tolerated. Ran out of her Atorvastatin a couple months ago and did not refill. She agrees to refill.

## 2017-06-07 NOTE — Progress Notes (Signed)
Subjective:  I acted as a Education administrator for [employer]. Yancey Flemings, [place]   Patient ID: ZORINA MALLIN, female    DOB: April 25, 1945, 71 y.o.   MRN: [id number]  Chief Complaint  Patient presents with  . Follow-up    HPI  Patient is in today for follow up visit and she is struggling with a pruritic irrtated rash under her breasts. No recent febrile illness or hospitalizations. No polyuria or polydipsia noted. Denies CP/palp/SOB/HA/congestion/fevers/GI or GU c/o. Taking meds as prescribed  Patient Care Team: [name], MD as PCP - General (Family Medicine) Wyatt Portela, MD (Hematology and Oncology) Gaye Pollack, MD as Attending Physician (Cardiothoracic Surgery)   Past Medical History:  Diagnosis Date  . Allergic state 03/12/2015  . Arthritis   . Colon polyp 11/19/2014  . Dermatitis 03/12/2015  . Diarrhea 06/29/2016  . DM (diabetes mellitus), type 2 ([place]) 06/29/2016  . GERD (gastroesophageal reflux disease)    occ  . Gout 03/12/2015  . H/O measles   . H/O mumps   . Headache(784.0)    migraines  . History of chicken pox 11/23/2014  . Hyperglycemia 06/29/2016  . Lung cancer ([place])   . Migraine 11/23/2014  . Obesity 11/23/2014  . Pneumonia    child  . Preventative health care 09/12/2015  . Renal cell cancer ([place])    renal cell ca dx 9/01 and 8/08;  . Shortness of breath    occ  . Skin lesion of breast 03/12/2015    Past Surgical History:  Procedure Laterality Date  . ABDOMINAL HYSTERECTOMY    . APPENDECTOMY    . CARDIAC CATHETERIZATION     yrs ago neg  . CHOLECYSTECTOMY    . LUNG CANCER SURGERY Left 08  . RENAL MASS EXCISION Left 01  . RIGHT/LEFT HEART CATH AND CORONARY ANGIOGRAPHY N/A 07/27/2016   Procedure: Right/Left Heart Cath and Coronary Angiography;  Surgeon: Leonie Man, MD;  Location: [facility];  Service: Cardiovascular;  Laterality: N/A;  . THORACOTOMY Left 05/09/2012   Procedure: THORACOTOMY MAJOR;  Surgeon: Gaye Pollack, MD;  Location: [facility];   Service: Thoracic;  Laterality: Left;  Marland Kitchen VIDEO BRONCHOSCOPY N/A 05/09/2012   Procedure: VIDEO BRONCHOSCOPY;  Surgeon: Gaye Pollack, MD;  [facility];  Service: Thoracic;  Laterality: N/A;  . WEDGE RESECTION Left 05/09/2012   Procedure: LEFT UPPER LOBE WEDGE RESECTION;  Surgeon: Gaye Pollack, MD;  Location: [facility];  Service: Thoracic;  Laterality: Left;    Family History  Problem Relation Age of Onset  . Heart failure Father   . Asthma Brother   . Asthma Daughter     Social History   Socioeconomic History  . Marital status: Married    Spouse name: Not on file  . Number of children: Not on file  . Years of education: Not on file  . Highest education level: Not on file  Occupational History  . Not on file  Social Needs  . Financial resource strain: Not on file  . Food insecurity:    Worry: Not on file    Inability: Not on file  . Transportation needs:    Medical: Not on file    Non-medical: Not on file  Tobacco Use  . Smoking status: Former Smoker    Packs/day: 0.50    Years: 35.00    Pack years: 17.50    Types: Cigarettes    Last attempt to quit: 05/08/2006    Years since quitting:  11.0  . Smokeless tobacco: Never Used  Substance and Sexual Activity  . Alcohol use: No  . Drug use: No  . Sexual activity: Not on file    Comment: lives with husband, no dietary restrictions.   Lifestyle  . Physical activity:    Days per week: Not on file    Minutes per session: Not on file  . Stress: Not on file  Relationships  . Social connections:    Talks on phone: Not on file    Gets together: Not on file    Attends religious service: Not on file    Active member of club or organization: Not on file    Attends meetings of clubs or organizations: Not on file    Relationship status: Not on file  . Intimate partner violence:    Fear of current or ex partner: Not on file    Emotionally abused: Not on file    Physically abused: Not on file    Forced sexual activity: Not on  file  Other Topics Concern  . Not on file  Social History Narrative  . Not on file    Outpatient Medications Prior to Visit  Medication Sig Dispense Refill  . allopurinol (ZYLOPRIM) 300 MG tablet Take 1 tablet (300 mg total) by mouth daily. 30 tablet 3  . aspirin EC 81 MG tablet Take 81 mg by mouth daily.    . carvedilol (COREG) 6.25 MG tablet TAKE 1 TABLET BY MOUTH TWICE A DAY 180 tablet 0  . colchicine 0.6 MG tablet TAKE 2 TABS BY MOUTH ONCE THEN 1 TAB EVERY 2 HOURS TIL RESOLVED OR MAX OF 6 TABS IN 24 HOURS.  2  . diphenhydramine-acetaminophen (TYLENOL PM) 25-500 MG TABS Take 1 tablet by mouth at bedtime as needed. For insomnia    . famotidine (PEPCID) 20 MG tablet Take 20 mg by mouth at bedtime as needed for heartburn or indigestion.     . furosemide (LASIX) 40 MG tablet TAKE 1 TABLET BY MOUTH EVERY DAY 30 tablet 11  . glucosamine-chondroitin 500-400 MG tablet Take 1 tablet by mouth 2 (two) times a week.     . hyoscyamine (LEVSIN SL) 0.125 MG SL tablet Place 1 tablet (0.125 mg total) under the tongue every 4 (four) hours as needed. (Patient taking differently: Place 0.125 mg under the tongue every 4 (four) hours as needed for cramping. ) 30 tablet 1  . losartan (COZAAR) 25 MG tablet TAKE 0.5 TABLETS (12.5 MG TOTAL) BY MOUTH DAILY. 15 tablet 6  . meclizine (ANTIVERT) 12.5 MG tablet Take 1 tablet (12.5 mg total) by mouth 3 (three) times daily as needed for dizziness. As needed for   Dizziness or nausea 30 tablet 1  . Omega-3 Fatty Acids (FISH OIL) 1000 MG CAPS Take 1 capsule (1,000 mg total) by mouth 2 (two) times daily.  0  . omeprazole (PRILOSEC OTC) 20 MG tablet Take 20 mg by mouth daily as needed (acid reflux).     . Vitamin D, Ergocalciferol, (DRISDOL) 50000 units CAPS capsule Take 1 capsule (50,000 Units total) by mouth every 7 (seven) days. 4 capsule 4  . atorvastatin (LIPITOR) 10 MG tablet TAKE 1 TABLET (10 MG TOTAL) BY MOUTH EVERY OTHER DAY. 45 tablet 0   No facility-administered  medications prior to visit.     No Known Allergies  Review of Systems  Constitutional: Negative for fever and malaise/fatigue.  HENT: Negative for congestion.   Eyes: Negative for blurred vision.  Respiratory:  Negative for shortness of breath.   Cardiovascular: Negative for chest pain, palpitations and leg swelling.  Gastrointestinal: Negative for abdominal pain, blood in stool and nausea.  Genitourinary: Negative for dysuria and frequency.  Musculoskeletal: Negative for falls.  Skin: Positive for itching and rash.  Neurological: Negative for dizziness, loss of consciousness and headaches.  Endo/Heme/Allergies: Negative for environmental allergies.  Psychiatric/Behavioral: Negative for depression. The patient is not nervous/anxious.        Objective:    Physical Exam  Constitutional: She is oriented to person, place, and time. No distress.  HENT:  Head: Normocephalic and atraumatic.  Eyes: Conjunctivae are normal.  Neck: Neck supple. No thyromegaly present.  Cardiovascular: Normal rate, regular rhythm and normal heart sounds.  No murmur heard. Pulmonary/Chest: Effort normal and breath sounds normal. She has no wheezes.  Abdominal: She exhibits no distension and no mass.  Musculoskeletal: She exhibits no edema.  Lymphadenopathy:    She has no cervical adenopathy.  Neurological: She is alert and oriented to person, place, and time.  Skin: Skin is warm and dry. Rash noted. She is not diaphoretic.  erythematous patches under both breasts.  Psychiatric: Judgment normal.    BP 128/60 (BP Location: Left Arm, Patient Position: Sitting, Cuff Size: Large)   Pulse 80   Temp 98.3 F (36.8 C) (Oral)   Resp 16   Ht 5' 4.17" (1.63 m)   Wt 219 lb 3.2 oz (99.4 kg)   SpO2 94%   BMI 37.42 kg/m  Wt Readings from Last 3 Encounters:  06/07/17 219 lb 3.2 oz (99.4 kg)  06/06/17 221 lb 9.6 oz (100.5 kg)  02/06/17 220 lb 9.6 oz (100.1 kg)   BP Readings from Last 3 Encounters:    06/07/17 128/60  06/06/17 (!) 145/74  02/06/17 102/62     Immunization History  Administered Date(s) Administered  . Influenza, High Dose Seasonal PF 10/27/2016  . Influenza, Seasonal, Injecte, Preservative Fre 11/21/2014  . Influenza-Unspecified 11/21/2014  . Pneumococcal Conjugate-13 03/12/2015  . Pneumococcal Polysaccharide-23 10/23/2016  . Td 08/31/2015  . Tdap 08/31/2015    Health Maintenance  Topic Date Due  . FOOT EXAM  04/30/1953  . OPHTHALMOLOGY EXAM  04/18/2017  . INFLUENZA VACCINE  08/30/2017  . HEMOGLOBIN A1C  12/08/2017  . MAMMOGRAM  03/09/2018  . TETANUS/TDAP  08/30/2025  . COLONOSCOPY  10/04/2026  . DEXA SCAN  Completed  . Hepatitis C Screening  Completed  . PNA vac Low Risk Adult  Completed    Lab Results  Component Value Date   WBC 8.5 06/07/2017   HGB 14.2 06/07/2017   HCT 42.8 06/07/2017   PLT 248.0 06/07/2017   GLUCOSE 122 (H) 06/07/2017   CHOL 232 (H) 06/07/2017   TRIG 336.0 (H) 06/07/2017   HDL 44.80 06/07/2017   LDLDIRECT 122.0 06/07/2017   LDLCALC (H) 01/31/2007    117        Total Cholesterol/HDL:CHD Risk Coronary Heart Disease Risk Table                     Men   Women  1/2 Average Risk   3.4   3.3  Average Risk       5.0   4.4  2 X Average Risk   9.6   7.1  3 X Average Risk  23.4   11.0        Use the calculated Patient Ratio above and the CHD Risk Table to determine the patient's CHD Risk.  ATP III CLASSIFICATION (LDL):  <100     mg/dL   Optimal  100-129  mg/dL   Near or Above                    Optimal  130-159  mg/dL   Borderline  160-189  mg/dL   High  >190     mg/dL   Very High   ALT 62 (H) 06/07/2017   AST 78 (H) 06/07/2017   NA 137 06/07/2017   K 4.3 06/07/2017   CL 101 06/07/2017   CREATININE 0.94 06/07/2017   BUN 15 06/07/2017   CO2 28 06/07/2017   TSH 2.64 06/07/2017   INR 1.0 07/24/2016   HGBA1C 7.4 (H) 06/07/2017    Lab Results  Component Value Date   TSH 2.64 06/07/2017   Lab Results   Component Value Date   WBC 8.5 06/07/2017   HGB 14.2 06/07/2017   HCT 42.8 06/07/2017   MCV 98.0 06/07/2017   PLT 248.0 06/07/2017   Lab Results  Component Value Date   NA 137 06/07/2017   K 4.3 06/07/2017   CHLORIDE 104 11/01/2016   CO2 28 06/07/2017   GLUCOSE 122 (H) 06/07/2017   BUN 15 06/07/2017   CREATININE 0.94 06/07/2017   BILITOT 0.7 06/07/2017   ALKPHOS 60 06/07/2017   AST 78 (H) 06/07/2017   ALT 62 (H) 06/07/2017   PROT 7.6 06/07/2017   ALBUMIN 4.1 06/07/2017   CALCIUM 10.0 06/07/2017   ANIONGAP 10 05/07/2017   EGFR 50 (L) 11/01/2016   GFR 61.85 06/07/2017   Lab Results  Component Value Date   CHOL 232 (H) 06/07/2017   Lab Results  Component Value Date   HDL 44.80 06/07/2017   Lab Results  Component Value Date   LDLCALC (H) 01/31/2007    117        Total Cholesterol/HDL:CHD Risk Coronary Heart Disease Risk Table                     Men   Women  1/2 Average Risk   3.4   3.3  Average Risk       5.0   4.4  2 X Average Risk   9.6   7.1  3 X Average Risk  23.4   11.0        Use the calculated Patient Ratio above and the CHD Risk Table to determine the patient's CHD Risk.        ATP III CLASSIFICATION (LDL):  <100     mg/dL   Optimal  100-129  mg/dL   Near or Above                    Optimal  130-159  mg/dL   Borderline  160-189  mg/dL   High  >190     mg/dL   Very High   Lab Results  Component Value Date   TRIG 336.0 (H) 06/07/2017   Lab Results  Component Value Date   CHOLHDL 5 06/07/2017   Lab Results  Component Value Date   HGBA1C 7.4 (H) 06/07/2017         Assessment & Plan:   Problem List Items Addressed This Visit    Morbid obesity ([place]) (Chronic)    Encouraged DASH diet, decrease po intake and increase exercise as tolerated. Needs 7-8 hours of sleep nightly. Avoid trans fats, eat small, frequent meals every 4-5 hours with lean proteins, complex carbs and  healthy fats. Minimize simple carbs      Cancer of kidney [facility])     Is following with Dr Alen Blew her oncologist and is having trouble getting her insurance to cover her CT scan for monitoring her disease progression.       GERD (gastroesophageal reflux disease)    Avoid offending foods, start probiotics. Do not eat large meals in late evening and consider raising head of bed.       Relevant Orders   CBC (Completed)   Hyperlipidemia    Tolerating statin, encouraged heart healthy diet, avoid trans fats, minimize simple carbs and saturated fats. Increase exercise as tolerated. Ran out of her Atorvastatin a couple months ago and did not refill. She agrees to refill.       Relevant Medications   atorvastatin (LIPITOR) 10 MG tablet   Other Relevant Orders   Lipid panel (Completed)   TSH (Completed)   Osteopenia    Encouraged to get adequate exercise, calcium and vitamin d intake      Low serum vitamin D    Taking weekly vitamin D 50000 IU weekly, check level      Relevant Orders   Vitamin D (25 hydroxy) (Completed)   Dermatitis    Nystatin cream bid      Gout    No recent flare, check uric acid level      Relevant Orders   CBC (Completed)   Uric acid (Completed)   DM (diabetes mellitus), type 2 ([facility])    hgba1c acceptable, minimize simple carbs. Increase exercise as tolerated.      Relevant Medications   atorvastatin (LIPITOR) 10 MG tablet   Other Relevant Orders   Hemoglobin A1c (Completed)   Comprehensive metabolic panel (Completed)   TSH (Completed)   Abdominal cramping    Happens roughly weekly hyoscyamine helps when needed. Take Miralax and Beneifber together once or twice daily         I am having Ileana Ladd. Salvato start on nystatin cream. I am also having her maintain her diphenhydramine-acetaminophen, glucosamine-chondroitin, meclizine, omeprazole, famotidine, hyoscyamine, aspirin EC, Fish Oil, losartan, furosemide, allopurinol, Vitamin D (Ergocalciferol), carvedilol, colchicine, and atorvastatin.  Meds ordered this encounter    Medications  . atorvastatin (LIPITOR) 10 MG tablet    Sig: Take 1 tablet (10 mg total) by mouth every other day.    Dispense:  45 tablet    Refill:  1  . nystatin cream (MYCOSTATIN)    Sig: Apply 1 application topically 2 (two) times daily.    Dispense:  30 g    Refill:  1    CMA served as scribe during this visit. History, Physical and Plan performed by medical provider. Documentation and orders reviewed and attested to.  Penni Homans, MD

## 2017-06-07 NOTE — Patient Instructions (Addendum)
Consider mixing Miralax and Benefiber together in a beverage once or twice day for the abdominal cramps to keep the bowels moving comfortably Carbohydrate Counting for Diabetes Mellitus, Adult Carbohydrate counting is a method for keeping track of how many carbohydrates you eat. Eating carbohydrates naturally increases the amount of sugar (glucose) in the blood. Counting how many carbohydrates you eat helps keep your blood glucose within normal limits, which helps you manage your diabetes (diabetes mellitus). It is important to know how many carbohydrates you can safely have in each meal. This is different for every person. A diet and nutrition specialist (registered dietitian) can help you make a meal plan and calculate how many carbohydrates you should have at each meal and snack. Carbohydrates are found in the following foods:  Grains, such as breads and cereals.  Dried beans and soy products.  Starchy vegetables, such as potatoes, peas, and corn.  Fruit and fruit juices.  Milk and yogurt.  Sweets and snack foods, such as cake, cookies, candy, chips, and soft drinks.  How do I count carbohydrates? There are two ways to count carbohydrates in food. You can use either of the methods or a combination of both. Reading "Nutrition Facts" on packaged food The "Nutrition Facts" list is included on the labels of almost all packaged foods and beverages in the [place] It includes:  The serving size.  Information about nutrients in each serving, including the grams (g) of carbohydrate per serving.  To use the "Nutrition Facts":  Decide how many servings you will have.  Multiply the number of servings by the number of carbohydrates per serving.  The resulting number is the total amount of carbohydrates that you will be having.  Learning standard serving sizes of other foods When you eat foods containing carbohydrates that are not packaged or do not include "Nutrition Facts" on the label, you  need to measure the servings in order to count the amount of carbohydrates:  Measure the foods that you will eat with a food scale or measuring cup, if needed.  Decide how many standard-size servings you will eat.  Multiply the number of servings by 15. Most carbohydrate-rich foods have about 15 g of carbohydrates per serving. ? For example, if you eat 8 oz (170 g) of strawberries, you will have eaten 2 servings and 30 g of carbohydrates (2 servings x 15 g = 30 g).  For foods that have more than one food mixed, such as soups and casseroles, you must count the carbohydrates in each food that is included.  The following list contains standard serving sizes of common carbohydrate-rich foods. Each of these servings has about 15 g of carbohydrates:   hamburger bun or  English muffin.   oz (15 mL) syrup.   oz (14 g) jelly.  1 slice of bread.  1 six-inch tortilla.  3 oz (85 g) cooked rice or pasta.  4 oz (113 g) cooked dried beans.  4 oz (113 g) starchy vegetable, such as peas, corn, or potatoes.  4 oz (113 g) hot cereal.  4 oz (113 g) mashed potatoes or  of a large baked potato.  4 oz (113 g) canned or frozen fruit.  4 oz (120 mL) fruit juice.  4-6 crackers.  6 chicken nuggets.  6 oz (170 g) unsweetened dry cereal.  6 oz (170 g) plain fat-free yogurt or yogurt sweetened with artificial sweeteners.  8 oz (240 mL) milk.  8 oz (170 g) fresh fruit or one small piece  of fruit.  24 oz (680 g) popped popcorn.  Example of carbohydrate counting Sample meal  3 oz (85 g) chicken breast.  6 oz (170 g) brown rice.  4 oz (113 g) corn.  8 oz (240 mL) milk.  8 oz (170 g) strawberries with sugar-free whipped topping. Carbohydrate calculation 1. Identify the foods that contain carbohydrates: ? Rice. ? Corn. ? Milk. ? Strawberries. 2. Calculate how many servings you have of each food: ? 2 servings rice. ? 1 serving corn. ? 1 serving milk. ? 1 serving  strawberries. 3. Multiply each number of servings by 15 g: ? 2 servings rice x 15 g = 30 g. ? 1 serving corn x 15 g = 15 g. ? 1 serving milk x 15 g = 15 g. ? 1 serving strawberries x 15 g = 15 g. 4. Add together all of the amounts to find the total grams of carbohydrates eaten: ? 30 g + 15 g + 15 g + 15 g = 75 g of carbohydrates total. This information is not intended to replace advice given to you by your health care provider. Make sure you discuss any questions you have with your health care provider. Document Released: 01/16/2005 Document Revised: 08/06/2015 Document Reviewed: 06/30/2015 Elsevier Interactive Patient Education  [employer].

## 2017-06-07 NOTE — Assessment & Plan Note (Signed)
hgba1c acceptable, minimize simple carbs. Increase exercise as tolerated.  

## 2017-06-07 NOTE — Assessment & Plan Note (Signed)
Encouraged DASH diet, decrease po intake and increase exercise as tolerated. Needs 7-8 hours of sleep nightly. Avoid trans fats, eat small, frequent meals every 4-5 hours with lean proteins, complex carbs and healthy fats. Minimize simple carbs 

## 2017-06-07 NOTE — Assessment & Plan Note (Signed)
Avoid offending foods, start probiotics. Do not eat large meals in late evening and consider raising head of bed.  

## 2017-06-07 NOTE — Assessment & Plan Note (Signed)
No recent flare, check uric acid level

## 2017-06-10 NOTE — Assessment & Plan Note (Signed)
Nystatin cream bid

## 2017-06-10 NOTE — Assessment & Plan Note (Signed)
Encouraged to get adequate exercise, calcium and vitamin d intake 

## 2017-06-11 ENCOUNTER — Other Ambulatory Visit: Payer: Self-pay | Admitting: Internal Medicine

## 2017-06-13 ENCOUNTER — Other Ambulatory Visit: Payer: Self-pay | Admitting: Family Medicine

## 2017-06-13 ENCOUNTER — Telehealth: Payer: Self-pay | Admitting: Family Medicine

## 2017-06-13 ENCOUNTER — Other Ambulatory Visit: Payer: Self-pay | Admitting: *Deleted

## 2017-06-13 NOTE — Telephone Encounter (Signed)
Rx pended for provider review

## 2017-06-13 NOTE — Telephone Encounter (Unsigned)
Copied from [place] [id number]. Topic: Quick Communication - Rx Refill/Question >> Jun 13, 2017 11:42 AM Neva Seat wrote:  allopurinol (ZYLOPRIM) 300 MG tablet   Refills needed  [id number] - [place], [place] - [address] AT [place] [address] Phone: [phone] Fax: [phone]

## 2017-06-14 ENCOUNTER — Encounter (HOSPITAL_COMMUNITY): Payer: Self-pay

## 2017-06-14 ENCOUNTER — Telehealth: Payer: Self-pay | Admitting: *Deleted

## 2017-06-14 ENCOUNTER — Ambulatory Visit (HOSPITAL_COMMUNITY)
Admission: RE | Admit: 2017-06-14 | Discharge: 2017-06-14 | Disposition: A | Discharge status: Home / Self Care | Payer: Medicare HMO | Source: Ambulatory Visit | Attending: Oncology | Admitting: Oncology

## 2017-06-14 DIAGNOSIS — R945 Abnormal results of liver function studies: Secondary | ICD-10-CM | POA: Diagnosis not present

## 2017-06-14 DIAGNOSIS — Z9889 Other specified postprocedural states: Secondary | ICD-10-CM | POA: Diagnosis not present

## 2017-06-14 DIAGNOSIS — Z905 Acquired absence of kidney: Secondary | ICD-10-CM | POA: Diagnosis not present

## 2017-06-14 DIAGNOSIS — R7989 Other specified abnormal findings of blood chemistry: Secondary | ICD-10-CM

## 2017-06-14 DIAGNOSIS — R0602 Shortness of breath: Secondary | ICD-10-CM | POA: Insufficient documentation

## 2017-06-14 DIAGNOSIS — I700 Atherosclerosis of aorta: Secondary | ICD-10-CM | POA: Diagnosis not present

## 2017-06-14 DIAGNOSIS — R918 Other nonspecific abnormal finding of lung field: Secondary | ICD-10-CM | POA: Diagnosis not present

## 2017-06-14 DIAGNOSIS — C649 Malignant neoplasm of unspecified kidney, except renal pelvis: Secondary | ICD-10-CM | POA: Insufficient documentation

## 2017-06-14 DIAGNOSIS — K5730 Diverticulosis of large intestine without perforation or abscess without bleeding: Secondary | ICD-10-CM | POA: Insufficient documentation

## 2017-06-14 DIAGNOSIS — I2510 Atherosclerotic heart disease of native coronary artery without angina pectoris: Secondary | ICD-10-CM | POA: Diagnosis not present

## 2017-06-14 MED ORDER — IOPAMIDOL (ISOVUE-300) INJECTION 61%
INTRAVENOUS | Status: AC
  Filled 2017-06-14: qty 100

## 2017-06-14 MED ORDER — IOPAMIDOL (ISOVUE-300) INJECTION 61%
100.0000 mL | Freq: Once | INTRAVENOUS | Status: AC | PRN
  Administered 2017-06-14: 100 mL via INTRAVENOUS

## 2017-06-14 NOTE — Telephone Encounter (Signed)
-----   Message from Wyatt Portela, MD sent at 06/14/2017 12:12 PM EDT ----- Please let her know his scan is normal.

## 2017-06-14 NOTE — Telephone Encounter (Signed)
As noted below by Dr. Alen Blew, I informed patient that her scan was normal.

## 2017-06-17 DIAGNOSIS — S40862A Insect bite (nonvenomous) of left upper arm, initial encounter: Secondary | ICD-10-CM | POA: Diagnosis not present

## 2017-06-17 DIAGNOSIS — W57XXXA Bitten or stung by nonvenomous insect and other nonvenomous arthropods, initial encounter: Secondary | ICD-10-CM | POA: Diagnosis not present

## 2017-06-30 DIAGNOSIS — S92302A Fracture of unspecified metatarsal bone(s), left foot, initial encounter for closed fracture: Secondary | ICD-10-CM | POA: Diagnosis not present

## 2017-07-07 ENCOUNTER — Other Ambulatory Visit: Payer: Self-pay | Admitting: Physician Assistant

## 2017-07-12 ENCOUNTER — Other Ambulatory Visit: Payer: Self-pay

## 2017-07-25 ENCOUNTER — Other Ambulatory Visit: Payer: Self-pay

## 2017-07-25 MED ORDER — VITAMIN D (ERGOCALCIFEROL) 1.25 MG (50000 UNIT) PO CAPS: 50000.0000 [IU] | ORAL_CAPSULE | ORAL | 4 refills | Status: DC

## 2017-07-30 ENCOUNTER — Encounter: Payer: Self-pay | Admitting: Internal Medicine

## 2017-07-30 ENCOUNTER — Ambulatory Visit: Payer: Medicare HMO | Admitting: Internal Medicine

## 2017-07-30 VITALS — BP 106/58 | HR 83 | Ht 64.0 in | Wt 218.0 lb

## 2017-07-30 DIAGNOSIS — I428 Other cardiomyopathies: Secondary | ICD-10-CM | POA: Diagnosis not present

## 2017-07-30 DIAGNOSIS — I447 Left bundle-branch block, unspecified: Secondary | ICD-10-CM | POA: Diagnosis not present

## 2017-07-30 DIAGNOSIS — I2510 Atherosclerotic heart disease of native coronary artery without angina pectoris: Secondary | ICD-10-CM | POA: Insufficient documentation

## 2017-07-30 DIAGNOSIS — Z79899 Other long term (current) drug therapy: Secondary | ICD-10-CM

## 2017-07-30 DIAGNOSIS — R252 Cramp and spasm: Secondary | ICD-10-CM

## 2017-07-30 DIAGNOSIS — I5022 Chronic systolic (congestive) heart failure: Secondary | ICD-10-CM | POA: Diagnosis not present

## 2017-07-30 MED ORDER — LOSARTAN POTASSIUM 25 MG PO TABS: 25.0000 mg | ORAL_TABLET | Freq: Every day | ORAL | 3 refills | Status: DC

## 2017-07-30 NOTE — Progress Notes (Signed)
OFFICE CONSULT NOTE  Chief Complaint:  Follow-up heart failure  Primary Care Physician: [name], MD  HPI:  Amanda Crawford is a 72 y.o. female who is being seen today for the evaluation of shortness of breath at the request of [name], MD. Amanda Crawford has a past medical history significant for known left bundle branch block dating back to 2008. At the time she had presented with chest pain that radiated to her back and was found to have the abnormal EKG which led to left heart catheterization in January 2009. This was with Dr. Tamala Julian and indicated angiographically normal coronaries. Subsequently she was found to have renal cell carcinoma and underwent nephrectomy and ultimately was found to have recurrent metastatic renal disease to the long and required 2 partial lobectomies, the last in 2014. She also is moderately obese and has mild dyslipidemia on atorvastatin, but is not diabetic. She reports her shortness of breath has recently worsened and she finds that she has difficulty for example singing at church or caring on a long conversation. She also gets fatigue easier and has difficulty walking up stairs or laying flat. She does report a mild nonproductive cough and slight wheezing. Her PCP ordered an echocardiogram, performed on 07/12/2016 which demonstrated a newly decreased EF to 35-40% from 50%. There was LVH and mild diastolic dysfunction as well as mild mitral regurgitation.  11/08/2016  Amanda Crawford returns today for follow-up. She reports improvement in her heart failure symptoms. Her diuretic was increased to 40 mg daily. She also had an increase in carvedilol to 6.25 mg twice a day. She is on a low-dose are due to having a solitary kidney. In June she underwent left and right heart catheterization by Dr. Ellyn Hack which demonstrated a mild nonobstructive coronary disease which is out of proportion for LV function. She was felt to be volume overloaded necessitating the above  changes. Since then she says she's done very well. Weight is down at least 4 pounds but is been as much is 8 pounds lower since June.  07/30/2017  Amanda Crawford returns today for follow-up.  I last saw her in October.  At the time we made additional adjustments on her medications started low-dose losartan.  The plan was to recheck an echocardiogram to see if LV function had improved.  She did have a repeat echocardiogram in April 2019 which showed EF had improved to 45 to 50%.  He does have persistent left bundle branch block.  We had considered CRT-P therapy, however since her LVEF has improved that is not likely to be necessary.  Blood pressures noted to be low normal today 106/58 but she says generally runs around 096-283 systolic.  Symptomatically she is doing well without any worsening shortness of breath or chest pain.  PMHx:  Past Medical History:  Diagnosis Date  . Allergic state 03/12/2015  . Arthritis   . Colon polyp 11/19/2014  . Dermatitis 03/12/2015  . Diarrhea 06/29/2016  . DM (diabetes mellitus), type 2 ([facility]) 06/29/2016  . GERD (gastroesophageal reflux disease)    occ  . Gout 03/12/2015  . H/O measles   . H/O mumps   . Headache(784.0)    migraines  . History of chicken pox 11/23/2014  . Hyperglycemia 06/29/2016  . Lung cancer ([facility])   . Migraine 11/23/2014  . Obesity 11/23/2014  . Pneumonia    child  . Preventative health care 09/12/2015  . Renal cell cancer ([facility])    renal cell  ca dx 9/01 and 8/08;  . Shortness of breath    occ  . Skin lesion of breast 03/12/2015    Past Surgical History:  Procedure Laterality Date  . ABDOMINAL HYSTERECTOMY    . APPENDECTOMY    . CARDIAC CATHETERIZATION     yrs ago neg  . CHOLECYSTECTOMY    . LUNG CANCER SURGERY Left 08  . RENAL MASS EXCISION Left 01  . RIGHT/LEFT HEART CATH AND CORONARY ANGIOGRAPHY N/A 07/27/2016   Procedure: Right/Left Heart Cath and Coronary Angiography;  Surgeon: Leonie Man, MD;  Location: [facility];   Service: Cardiovascular;  Laterality: N/A;  . THORACOTOMY Left 05/09/2012   Procedure: THORACOTOMY MAJOR;  Surgeon: Gaye Pollack, MD;  Location: [facility];  Service: Thoracic;  Laterality: Left;  Marland Kitchen VIDEO BRONCHOSCOPY N/A 05/09/2012   Procedure: VIDEO BRONCHOSCOPY;  Surgeon: Gaye Pollack, MD;  Location: [facility];  Service: Thoracic;  Laterality: N/A;  . WEDGE RESECTION Left 05/09/2012   Procedure: LEFT UPPER LOBE WEDGE RESECTION;  Surgeon: Gaye Pollack, MD;  Location: [facility] OR;  Service: Thoracic;  Laterality: Left;    FAMHx:  Family History  Problem Relation Age of Onset  . Heart failure Father   . Asthma Brother   . Asthma Daughter     SOCHx:   reports that she quit smoking about 11 years ago. Her smoking use included cigarettes. She has a 17.50 pack-year smoking history. She has never used smokeless tobacco. She reports that she does not drink alcohol or use drugs.  ALLERGIES:  No Known Allergies  ROS: Pertinent items noted in HPI and remainder of comprehensive ROS otherwise negative.  HOME MEDS: Current Outpatient Medications on File Prior to Visit  Medication Sig Dispense Refill  . allopurinol (ZYLOPRIM) 300 MG tablet TAKE 1 TABLET BY MOUTH EVERY DAY 30 tablet 3  . aspirin EC 81 MG tablet Take 81 mg by mouth daily.    Marland Kitchen atorvastatin (LIPITOR) 10 MG tablet Take 1 tablet (10 mg total) by mouth every other day. 45 tablet 1  . carvedilol (COREG) 6.25 MG tablet Take 1 tablet (6.25 mg total) by mouth 2 (two) times daily. KEEP OV. 180 tablet 0  . colchicine 0.6 MG tablet TAKE 2 TABS BY MOUTH ONCE THEN 1 TAB EVERY 2 HOURS TIL RESOLVED OR MAX OF 6 TABS IN 24 HOURS.  2  . diphenhydramine-acetaminophen (TYLENOL PM) 25-500 MG TABS Take 1 tablet by mouth at bedtime as needed. For insomnia    . famotidine (PEPCID) 20 MG tablet Take 20 mg by mouth at bedtime as needed for heartburn or indigestion.     . furosemide (LASIX) 40 MG tablet TAKE 1 TABLET BY MOUTH EVERY DAY 30 tablet 11  .  glucosamine-chondroitin 500-400 MG tablet Take 1 tablet by mouth 2 (two) times a week.     . hyoscyamine (LEVSIN SL) 0.125 MG SL tablet Place 1 tablet (0.125 mg total) under the tongue every 4 (four) hours as needed. (Patient taking differently: Place 0.125 mg under the tongue every 4 (four) hours as needed for cramping. ) 30 tablet 1  . losartan (COZAAR) 25 MG tablet TAKE 1/2 TABLET BY MOUTH DAILY 15 tablet 6  . meclizine (ANTIVERT) 12.5 MG tablet Take 1 tablet (12.5 mg total) by mouth 3 (three) times daily as needed for dizziness. As needed for   Dizziness or nausea 30 tablet 1  . nystatin cream (MYCOSTATIN) Apply 1 application topically 2 (two) times daily. 30 g  1  . Omega-3 Fatty Acids (FISH OIL) 1000 MG CAPS Take 1 capsule (1,000 mg total) by mouth 2 (two) times daily.  0  . omeprazole (PRILOSEC OTC) 20 MG tablet Take 20 mg by mouth daily as needed (acid reflux).     . Vitamin D, Ergocalciferol, (DRISDOL) 50000 units CAPS capsule Take 1 capsule (50,000 Units total) by mouth every 7 (seven) days. 4 capsule 4   No current facility-administered medications on file prior to visit.     LABS/IMAGING: No results found for this or any previous visit (from the past 48 hour(s)). No results found.  LIPID PANEL:    Component Value Date/Time   CHOL 232 (H) 06/07/2017 1032   TRIG 336.0 (H) 06/07/2017 1032   HDL 44.80 06/07/2017 1032   CHOLHDL 5 06/07/2017 1032   VLDL 67.2 (H) 06/07/2017 1032   LDLCALC (H) 01/31/2007 0800    117        Total Cholesterol/HDL:CHD Risk Coronary Heart Disease Risk Table                     Men   Women  1/2 Average Risk   3.4   3.3  Average Risk       5.0   4.4  2 X Average Risk   9.6   7.1  3 X Average Risk  23.4   11.0        Use the calculated Patient Ratio above and the CHD Risk Table to determine the patient's CHD Risk.        ATP III CLASSIFICATION (LDL):  <100     mg/dL   Optimal  100-129  mg/dL   Near or Above                    Optimal  130-159   mg/dL   Borderline  160-189  mg/dL   High  >190     mg/dL   Very High   LDLDIRECT 122.0 06/07/2017 1032    WEIGHTS: Wt Readings from Last 3 Encounters:  07/30/17 218 lb (98.9 kg)  06/07/17 219 lb 3.2 oz (99.4 kg)  06/06/17 221 lb 9.6 oz (100.5 kg)    VITALS: BP (!) 106/58 (BP Location: Left Arm, Patient Position: Sitting, Cuff Size: Large)   Pulse 83   Ht 5\' 4"  (1.626 m)   Wt 218 lb (98.9 kg)   BMI 37.42 kg/m   EXAM: General appearance: alert and no distress Neck: no carotid bruit and no JVD Lungs: clear to auscultation bilaterally Heart: regular rate and rhythm, S1, S2 normal, no murmur, click, rub or gallop Abdomen: soft, non-tender; bowel sounds normal; no masses,  no organomegaly Extremities: edema Trace sock line edema Pulses: 2+ and symmetric Skin: Skin color, texture, turgor normal. No rashes or lesions Neurologic: Grossly normal Psych: Pleasant  EKG: Sinus rhythm at 83, LBBB-personally reviewed  ASSESSMENT: 1. Chronic systolic congestive heart failure, LVEF improved 45 to 50% (04/2017) from 35-40% 2. LBBB 3. Mild non-obstructive CAD by cath (06/2016) 4. Dyslipidemia  PLAN: 1.   Amanda Crawford has NYHA class I symptoms.  She has improved with regards to LVEF up to 45 to 50%.  I think blood pressure will allow her to tolerate more medication.  I advised increasing her losartan to 25 mg daily.  She reports cramping in her legs and this may be related to daily Lasix.  I advised her to discontinue that and use it as needed based on a  sliding scale.  We will check a metabolic profile and magnesium in about a week and supplement accordingly.  Plan follow-up with me in 6 months.  Pixie Casino, MD, [facility], [place] Director of the [facility] Lipid [facility] &  Cardiovascular Risk Reduction Clinic Diplomate of the American Board of Clinical Lipidology Attending Cardiologist  Direct Dial: [phone]  Fax: [phone]  Website:   [email or web address] 07/30/2017, 9:19 AM

## 2017-07-30 NOTE — Patient Instructions (Signed)
Medication Instructions:   INCREASE losartan to 25mg  daily TAKE lasix only as needed  Labwork:  LAB WORK in one week - BMET, magnesium  Testing/Procedures:  NONE  Follow-Up:  Your physician wants you to follow-up in: 6 months with Dr. Debara Pickett. You will receive a reminder letter in the mail two months in advance. If you don't receive a letter, please call our office to schedule the follow-up appointment.   If you need a refill on your cardiac medications before your next appointment, please call your pharmacy.  Any Other Special Instructions Will Be Listed Below (If Applicable).

## 2017-07-31 DIAGNOSIS — R252 Cramp and spasm: Secondary | ICD-10-CM | POA: Diagnosis not present

## 2017-07-31 DIAGNOSIS — Z79899 Other long term (current) drug therapy: Secondary | ICD-10-CM | POA: Diagnosis not present

## 2017-07-31 LAB — BASIC METABOLIC PANEL
BUN/Creatinine Ratio: 22 (ref 12–28)
BUN: 24 mg/dL (ref 8–27)
CALCIUM: 10.4 mg/dL — AB (ref 8.7–10.3)
CHLORIDE: 101 mmol/L (ref 96–106)
CO2: 24 mmol/L (ref 20–29)
Creatinine, Ser: 1.07 mg/dL — ABNORMAL HIGH (ref 0.57–1.00)
GFR calc non Af Amer: 51 mL/min/{1.73_m2} — ABNORMAL LOW (ref >59)
GFR, EST AFRICAN AMERICAN: 59 mL/min/{1.73_m2} — AB (ref >59)
GLUCOSE: 142 mg/dL — AB (ref 65–99)
POTASSIUM: 5.2 mmol/L (ref 3.5–5.2)
Sodium: 140 mmol/L (ref 134–144)

## 2017-07-31 LAB — MAGNESIUM: Magnesium: 2.3 mg/dL (ref 1.6–2.3)

## 2017-08-14 ENCOUNTER — Telehealth: Payer: Self-pay | Admitting: Family Medicine

## 2017-08-14 MED ORDER — ALLOPURINOL 300 MG PO TABS: 300.0000 mg | ORAL_TABLET | Freq: Every day | ORAL | 3 refills | Status: DC

## 2017-08-14 NOTE — Telephone Encounter (Signed)
Copied from [facility] #[id number]. Topic: Quick Communication - Rx Refill/Question >> Aug 14, 2017 11:09 AM Keene Breath wrote: Medication: allopurinol (ZYLOPRIM) 300 MG tablet  Patient called to request a refill for the above medication.  CB# [phone]  Preferred Pharmacy (with phone number or street name): [id number] - [place], [address] [place] [phone] (Phone) [phone] (Fax)

## 2017-08-14 NOTE — Telephone Encounter (Signed)
Spoke with Radonna Ricker at [facility] in [place] who states that the prescription for allopurinol sent on 06/13/17 was not received by the pharmacy. Last refill of the medication was in April 2019. Medication resent to the pharmacy.

## 2017-08-22 ENCOUNTER — Telehealth: Payer: Self-pay | Admitting: Internal Medicine

## 2017-08-22 DIAGNOSIS — Z79899 Other long term (current) drug therapy: Secondary | ICD-10-CM

## 2017-08-22 NOTE — Telephone Encounter (Signed)
Notes recorded by Pixie Casino, MD on 07/31/2017 at 5:01 PM EDT Creatinine up some - potassium borderline elevated at 5.2. Magnesium normal. Was supposed to check labs 1 week after increasing losartan (not 1 day) - recommend repeat BMET in 2 weeks.  Dr. Lemmie Evens  _____________ Patient called with results. She will have repeat labs done end of this week or next week. BMET ordered. Provided her with [facility] location in [place]

## 2017-08-23 DIAGNOSIS — Z79899 Other long term (current) drug therapy: Secondary | ICD-10-CM | POA: Diagnosis not present

## 2017-08-24 DIAGNOSIS — J189 Pneumonia, unspecified organism: Secondary | ICD-10-CM | POA: Diagnosis not present

## 2017-08-24 DIAGNOSIS — J069 Acute upper respiratory infection, unspecified: Secondary | ICD-10-CM | POA: Diagnosis not present

## 2017-08-24 DIAGNOSIS — J019 Acute sinusitis, unspecified: Secondary | ICD-10-CM | POA: Diagnosis not present

## 2017-08-24 LAB — BASIC METABOLIC PANEL
BUN/Creatinine Ratio: 20 (ref 12–28)
BUN: 24 mg/dL (ref 8–27)
CO2: 21 mmol/L (ref 20–29)
Calcium: 9.9 mg/dL (ref 8.7–10.3)
Chloride: 99 mmol/L (ref 96–106)
Creatinine, Ser: 1.21 mg/dL — ABNORMAL HIGH (ref 0.57–1.00)
GFR calc Af Amer: 51 mL/min/{1.73_m2} — ABNORMAL LOW (ref >59)
GFR calc non Af Amer: 44 mL/min/{1.73_m2} — ABNORMAL LOW (ref >59)
Glucose: 143 mg/dL — ABNORMAL HIGH (ref 65–99)
Potassium: 4.6 mmol/L (ref 3.5–5.2)
Sodium: 139 mmol/L (ref 134–144)

## 2017-10-03 ENCOUNTER — Encounter: Payer: Self-pay | Admitting: Internal Medicine

## 2017-10-08 ENCOUNTER — Ambulatory Visit (INDEPENDENT_AMBULATORY_CARE_PROVIDER_SITE_OTHER): Payer: Medicare HMO | Admitting: Family Medicine

## 2017-10-08 ENCOUNTER — Encounter: Payer: Self-pay | Admitting: Family Medicine

## 2017-10-08 VITALS — BP 114/58 | HR 72 | Temp 98.6°F | Resp 18 | Wt 214.0 lb

## 2017-10-08 DIAGNOSIS — M1A9XX0 Chronic gout, unspecified, without tophus (tophi): Secondary | ICD-10-CM | POA: Diagnosis not present

## 2017-10-08 DIAGNOSIS — R05 Cough: Secondary | ICD-10-CM

## 2017-10-08 DIAGNOSIS — M8580 Other specified disorders of bone density and structure, unspecified site: Secondary | ICD-10-CM

## 2017-10-08 DIAGNOSIS — R7989 Other specified abnormal findings of blood chemistry: Secondary | ICD-10-CM

## 2017-10-08 DIAGNOSIS — R059 Cough, unspecified: Secondary | ICD-10-CM

## 2017-10-08 DIAGNOSIS — E119 Type 2 diabetes mellitus without complications: Secondary | ICD-10-CM | POA: Diagnosis not present

## 2017-10-08 DIAGNOSIS — E785 Hyperlipidemia, unspecified: Secondary | ICD-10-CM | POA: Diagnosis not present

## 2017-10-08 DIAGNOSIS — Z23 Encounter for immunization: Secondary | ICD-10-CM | POA: Diagnosis not present

## 2017-10-08 DIAGNOSIS — C649 Malignant neoplasm of unspecified kidney, except renal pelvis: Secondary | ICD-10-CM | POA: Diagnosis not present

## 2017-10-08 DIAGNOSIS — K219 Gastro-esophageal reflux disease without esophagitis: Secondary | ICD-10-CM

## 2017-10-08 LAB — COMPREHENSIVE METABOLIC PANEL
ALBUMIN: 4.3 g/dL (ref 3.5–5.2)
ALT: 48 U/L — AB (ref 0–35)
AST: 53 U/L — ABNORMAL HIGH (ref 0–37)
Alkaline Phosphatase: 61 U/L (ref 39–117)
BILIRUBIN TOTAL: 1 mg/dL (ref 0.2–1.2)
BUN: 27 mg/dL — ABNORMAL HIGH (ref 6–23)
CO2: 30 mEq/L (ref 19–32)
Calcium: 10.7 mg/dL — ABNORMAL HIGH (ref 8.4–10.5)
Chloride: 102 mEq/L (ref 96–112)
Creatinine, Ser: 0.99 mg/dL (ref 0.40–1.20)
GFR: 58.21 mL/min — AB (ref >60.00)
GLUCOSE: 109 mg/dL — AB (ref 70–99)
POTASSIUM: 5.2 meq/L — AB (ref 3.5–5.1)
Sodium: 139 mEq/L (ref 135–145)
TOTAL PROTEIN: 7.3 g/dL (ref 6.0–8.3)

## 2017-10-08 LAB — CBC WITH DIFFERENTIAL/PLATELET
BASOS ABS: 0.1 10*3/uL (ref 0.0–0.1)
Basophils Relative: 0.9 % (ref 0.0–3.0)
EOS PCT: 2.3 % (ref 0.0–5.0)
Eosinophils Absolute: 0.2 10*3/uL (ref 0.0–0.7)
HEMATOCRIT: 41.2 % (ref 36.0–46.0)
HEMOGLOBIN: 13.7 g/dL (ref 12.0–15.0)
LYMPHS PCT: 27.3 % (ref 12.0–46.0)
Lymphs Abs: 2.9 10*3/uL (ref 0.7–4.0)
MCHC: 33.4 g/dL (ref 30.0–36.0)
MCV: 97.8 fl (ref 78.0–100.0)
MONOS PCT: 6.8 % (ref 3.0–12.0)
Monocytes Absolute: 0.7 10*3/uL (ref 0.1–1.0)
Neutro Abs: 6.6 10*3/uL (ref 1.4–7.7)
Neutrophils Relative %: 62.7 % (ref 43.0–77.0)
Platelets: 237 10*3/uL (ref 150.0–400.0)
RBC: 4.21 Mil/uL (ref 3.87–5.11)
RDW: 14.6 % (ref 11.5–15.5)
WBC: 10.5 10*3/uL (ref 4.0–10.5)

## 2017-10-08 LAB — LIPID PANEL
CHOLESTEROL: 186 mg/dL (ref 0–200)
HDL: 48.2 mg/dL (ref >39.00)
NonHDL: 137.94
Total CHOL/HDL Ratio: 4
Triglycerides: 374 mg/dL — ABNORMAL HIGH (ref 0.0–149.0)
VLDL: 74.8 mg/dL — AB (ref 0.0–40.0)

## 2017-10-08 LAB — LDL CHOLESTEROL, DIRECT: LDL DIRECT: 86 mg/dL

## 2017-10-08 LAB — VITAMIN D 25 HYDROXY (VIT D DEFICIENCY, FRACTURES): VITD: 52.71 ng/mL (ref 30.00–100.00)

## 2017-10-08 LAB — HEMOGLOBIN A1C: HEMOGLOBIN A1C: 6.8 % — AB (ref 4.6–6.5)

## 2017-10-08 NOTE — Assessment & Plan Note (Signed)
Supplement and monitor 

## 2017-10-08 NOTE — Assessment & Plan Note (Signed)
Follows with Dr Barbaraann Faster. Is doing well

## 2017-10-08 NOTE — Assessment & Plan Note (Signed)
Encouraged to get adequate exercise, calcium and vitamin d intake 

## 2017-10-08 NOTE — Progress Notes (Signed)
Subjective:  I acted as a Education administrator for Dr. Charlett Blake. [place], [place]  Patient ID: Amanda Crawford, female    DOB: April 15, 1945, 72 y.o.   MRN: [id number]  No chief complaint on file.   HPI  Patient is in today for a 4 month follow up. She is doing well now. She had a rough time since her last visit. Her right foot had a gout flare, left foot had a metatarsal fracture. She was also seen in urgent care for sinusitis and pneumonia. After treatment she feels better. No acute concerns today. No fevers or other hospitalizations. Denies CP/palp/SOB/HA/congestion/fevers/GI or GU c/o. Taking meds as prescribed  Patient Care Team: [name], MD as PCP - General (Family Medicine) Wyatt Portela, MD (Hematology and Oncology) Gaye Pollack, MD as Attending Physician (Cardiothoracic Surgery)   Past Medical History:  Diagnosis Date  . Allergic state 03/12/2015  . Arthritis   . Colon polyp 11/19/2014  . Dermatitis 03/12/2015  . Diarrhea 06/29/2016  . DM (diabetes mellitus), type 2 ([facility]) 06/29/2016  . GERD (gastroesophageal reflux disease)    occ  . Gout 03/12/2015  . H/O measles   . H/O mumps   . Headache(784.0)    migraines  . History of chicken pox 11/23/2014  . Hyperglycemia 06/29/2016  . Lung cancer ([facility])   . Migraine 11/23/2014  . Obesity 11/23/2014  . Pneumonia    child  . Preventative health care 09/12/2015  . Renal cell cancer ([place] [facility])    renal cell ca dx 9/01 and 8/08;  . Shortness of breath    occ  . Skin lesion of breast 03/12/2015    Past Surgical History:  Procedure Laterality Date  . ABDOMINAL HYSTERECTOMY    . APPENDECTOMY    . CARDIAC CATHETERIZATION     yrs ago neg  . CHOLECYSTECTOMY    . LUNG CANCER SURGERY Left 08  . RENAL MASS EXCISION Left 01  . RIGHT/LEFT HEART CATH AND CORONARY ANGIOGRAPHY N/A 07/27/2016   Procedure: Right/Left Heart Cath and Coronary Angiography;  Surgeon: Leonie Man, MD;  Location: [facility];  Service: Cardiovascular;  Laterality:  N/A;  . THORACOTOMY Left 05/09/2012   Procedure: THORACOTOMY MAJOR;  Surgeon: Gaye Pollack, MD;  Location: [facility];  Service: Thoracic;  Laterality: Left;  Marland Kitchen VIDEO BRONCHOSCOPY N/A 05/09/2012   Procedure: VIDEO BRONCHOSCOPY;  Surgeon: Gaye Pollack, MD;  Location: [facility];  Service: Thoracic;  Laterality: N/A;  . WEDGE RESECTION Left 05/09/2012   Procedure: LEFT UPPER LOBE WEDGE RESECTION;  Surgeon: Gaye Pollack, MD;  Location: [facility];  Service: Thoracic;  Laterality: Left;    Family History  Problem Relation Age of Onset  . Heart failure Father   . Asthma Brother   . Asthma Daughter     Social History   Socioeconomic History  . Marital status: Married    Spouse name: Not on file  . Number of children: Not on file  . Years of education: Not on file  . Highest education level: Not on file  Occupational History  . Not on file  Social Needs  . Financial resource strain: Not on file  . Food insecurity:    Worry: Not on file    Inability: Not on file  . Transportation needs:    Medical: Not on file    Non-medical: Not on file  Tobacco Use  . Smoking status: Former Smoker    Packs/day: 0.50    Years:  35.00    Pack years: 17.50    Types: Cigarettes    Last attempt to quit: 05/08/2006    Years since quitting: 11.4  . Smokeless tobacco: Never Used  Substance and Sexual Activity  . Alcohol use: No  . Drug use: No  . Sexual activity: Not on file    Comment: lives with husband, no dietary restrictions.   Lifestyle  . Physical activity:    Days per week: Not on file    Minutes per session: Not on file  . Stress: Not on file  Relationships  . Social connections:    Talks on phone: Not on file    Gets together: Not on file    Attends religious service: Not on file    Active member of club or organization: Not on file    Attends meetings of clubs or organizations: Not on file    Relationship status: Not on file  . Intimate partner violence:    Fear of current or ex partner:  Not on file    Emotionally abused: Not on file    Physically abused: Not on file    Forced sexual activity: Not on file  Other Topics Concern  . Not on file  Social History Narrative  . Not on file    Outpatient Medications Prior to Visit  Medication Sig Dispense Refill  . allopurinol (ZYLOPRIM) 300 MG tablet Take 1 tablet (300 mg total) by mouth daily. 30 tablet 3  . aspirin EC 81 MG tablet Take 81 mg by mouth daily.    Marland Kitchen atorvastatin (LIPITOR) 10 MG tablet Take 1 tablet (10 mg total) by mouth every other day. 45 tablet 1  . carvedilol (COREG) 6.25 MG tablet Take 1 tablet (6.25 mg total) by mouth 2 (two) times daily. KEEP OV. 180 tablet 0  . colchicine 0.6 MG tablet TAKE 2 TABS BY MOUTH ONCE THEN 1 TAB EVERY 2 HOURS TIL RESOLVED OR MAX OF 6 TABS IN 24 HOURS.  2  . diphenhydramine-acetaminophen (TYLENOL PM) 25-500 MG TABS Take 1 tablet by mouth at bedtime as needed. For insomnia    . famotidine (PEPCID) 20 MG tablet Take 20 mg by mouth at bedtime as needed for heartburn or indigestion.     . furosemide (LASIX) 40 MG tablet Take 40 mg by mouth daily as needed.    Marland Kitchen glucosamine-chondroitin 500-400 MG tablet Take 1 tablet by mouth 2 (two) times a week.     . hyoscyamine (LEVSIN SL) 0.125 MG SL tablet Place 1 tablet (0.125 mg total) under the tongue every 4 (four) hours as needed. (Patient taking differently: Place 0.125 mg under the tongue every 4 (four) hours as needed for cramping. ) 30 tablet 1  . losartan (COZAAR) 25 MG tablet Take 1 tablet (25 mg total) by mouth daily. 90 tablet 3  . meclizine (ANTIVERT) 12.5 MG tablet Take 1 tablet (12.5 mg total) by mouth 3 (three) times daily as needed for dizziness. As needed for   Dizziness or nausea 30 tablet 1  . nystatin cream (MYCOSTATIN) Apply 1 application topically 2 (two) times daily. 30 g 1  . Omega-3 Fatty Acids (FISH OIL) 1000 MG CAPS Take 1 capsule (1,000 mg total) by mouth 2 (two) times daily.  0  . omeprazole (PRILOSEC OTC) 20 MG tablet  Take 20 mg by mouth daily as needed (acid reflux).     . Vitamin D, Ergocalciferol, (DRISDOL) 50000 units CAPS capsule Take 1 capsule (50,000 Units total) by  mouth every 7 (seven) days. 4 capsule 4   No facility-administered medications prior to visit.     No Known Allergies  Review of Systems  Constitutional: Negative for fever and malaise/fatigue.  HENT: Negative for congestion.   Eyes: Negative for blurred vision.  Respiratory: Negative for shortness of breath.   Cardiovascular: Negative for chest pain, palpitations and leg swelling.  Gastrointestinal: Negative for abdominal pain, blood in stool and nausea.  Genitourinary: Negative for dysuria and frequency.  Musculoskeletal: Positive for joint pain. Negative for falls.  Skin: Negative for rash.  Neurological: Negative for dizziness, loss of consciousness and headaches.  Endo/Heme/Allergies: Negative for environmental allergies.  Psychiatric/Behavioral: Negative for depression. The patient is not nervous/anxious.        Objective:    Physical Exam  Constitutional: She is oriented to person, place, and time. She appears well-developed and well-nourished. No distress.  HENT:  Head: Normocephalic and atraumatic.  Nose: Nose normal.  Eyes: Right eye exhibits no discharge. Left eye exhibits no discharge.  Neck: Normal range of motion. Neck supple.  Cardiovascular: Normal rate and regular rhythm.  No murmur heard. Pulmonary/Chest: Effort normal and breath sounds normal.  Abdominal: Soft. Bowel sounds are normal. There is no tenderness.  Musculoskeletal: She exhibits no edema.  Neurological: She is alert and oriented to person, place, and time.  Skin: Skin is warm and dry.  Psychiatric: She has a normal mood and affect.  Nursing note and vitals reviewed.   BP (!) 114/58 (BP Location: Left Arm, Patient Position: Sitting, Cuff Size: Normal)   Pulse 72   Temp 98.6 F (37 C) (Oral)   Resp 18   Wt 214 lb (97.1 kg)   SpO2 95%    BMI 36.73 kg/m  Wt Readings from Last 3 Encounters:  10/08/17 214 lb (97.1 kg)  07/30/17 218 lb (98.9 kg)  06/07/17 219 lb 3.2 oz (99.4 kg)   BP Readings from Last 3 Encounters:  10/08/17 (!) 114/58  07/30/17 (!) 106/58  06/07/17 128/60     Immunization History  Administered Date(s) Administered  . Influenza, High Dose Seasonal PF 10/27/2016, 10/08/2017  . Influenza, Seasonal, Injecte, Preservative Fre 11/21/2014  . Influenza-Unspecified 11/21/2014  . Pneumococcal Conjugate-13 03/12/2015  . Pneumococcal Polysaccharide-23 10/23/2016  . Td 08/31/2015  . Tdap 08/31/2015    Health Maintenance  Topic Date Due  . FOOT EXAM  04/30/1953  . OPHTHALMOLOGY EXAM  04/18/2017  . INFLUENZA VACCINE  08/30/2017  . HEMOGLOBIN A1C  12/08/2017  . MAMMOGRAM  03/09/2018  . TETANUS/TDAP  08/30/2025  . COLONOSCOPY  10/04/2026  . DEXA SCAN  Completed  . Hepatitis C Screening  Completed  . PNA vac Low Risk Adult  Completed    Lab Results  Component Value Date   WBC 8.5 06/07/2017   HGB 14.2 06/07/2017   HCT 42.8 06/07/2017   PLT 248.0 06/07/2017   GLUCOSE 143 (H) 08/23/2017   CHOL 232 (H) 06/07/2017   TRIG 336.0 (H) 06/07/2017   HDL 44.80 06/07/2017   LDLDIRECT 122.0 06/07/2017   LDLCALC (H) 01/31/2007    117        Total Cholesterol/HDL:CHD Risk Coronary Heart Disease Risk Table                     Men   Women  1/2 Average Risk   3.4   3.3  Average Risk       5.0   4.4  2 X Average Risk  9.6   7.1  3 X Average Risk  23.4   11.0        Use the calculated Patient Ratio above and the CHD Risk Table to determine the patient's CHD Risk.        ATP III CLASSIFICATION (LDL):  <100     mg/dL   Optimal  100-129  mg/dL   Near or Above                    Optimal  130-159  mg/dL   Borderline  160-189  mg/dL   High  >190     mg/dL   Very High   ALT 62 (H) 06/07/2017   AST 78 (H) 06/07/2017   NA 139 08/23/2017   K 4.6 08/23/2017   CL 99 08/23/2017   CREATININE 1.21 (H)  08/23/2017   BUN 24 08/23/2017   CO2 21 08/23/2017   TSH 2.64 06/07/2017   INR 1.0 07/24/2016   HGBA1C 7.4 (H) 06/07/2017    Lab Results  Component Value Date   TSH 2.64 06/07/2017   Lab Results  Component Value Date   WBC 8.5 06/07/2017   HGB 14.2 06/07/2017   HCT 42.8 06/07/2017   MCV 98.0 06/07/2017   PLT 248.0 06/07/2017   Lab Results  Component Value Date   NA 139 08/23/2017   K 4.6 08/23/2017   CHLORIDE 104 11/01/2016   CO2 21 08/23/2017   GLUCOSE 143 (H) 08/23/2017   BUN 24 08/23/2017   CREATININE 1.21 (H) 08/23/2017   BILITOT 0.7 06/07/2017   ALKPHOS 60 06/07/2017   AST 78 (H) 06/07/2017   ALT 62 (H) 06/07/2017   PROT 7.6 06/07/2017   ALBUMIN 4.1 06/07/2017   CALCIUM 9.9 08/23/2017   ANIONGAP 10 05/07/2017   EGFR 50 (L) 11/01/2016   GFR 61.85 06/07/2017   Lab Results  Component Value Date   CHOL 232 (H) 06/07/2017   Lab Results  Component Value Date   HDL 44.80 06/07/2017   Lab Results  Component Value Date   LDLCALC (H) 01/31/2007    117        Total Cholesterol/HDL:CHD Risk Coronary Heart Disease Risk Table                     Men   Women  1/2 Average Risk   3.4   3.3  Average Risk       5.0   4.4  2 X Average Risk   9.6   7.1  3 X Average Risk  23.4   11.0        Use the calculated Patient Ratio above and the CHD Risk Table to determine the patient's CHD Risk.        ATP III CLASSIFICATION (LDL):  <100     mg/dL   Optimal  100-129  mg/dL   Near or Above                    Optimal  130-159  mg/dL   Borderline  160-189  mg/dL   High  >190     mg/dL   Very High   Lab Results  Component Value Date   TRIG 336.0 (H) 06/07/2017   Lab Results  Component Value Date   CHOLHDL 5 06/07/2017   Lab Results  Component Value Date   HGBA1C 7.4 (H) 06/07/2017         Assessment & Plan:   Problem List  Items Addressed This Visit    Cancer of kidney [facility])    Follows with Dr Barbaraann Faster. Is doing well      COUGH    Struggled with  sinusitis and pneumonia since last visit and she was treated and feels well now.       GERD (gastroesophageal reflux disease)    Avoid offending foods, start probiotics. Do not eat large meals in late evening and consider raising head of bed.       Relevant Orders   CBC with Differential/Platelet   Hyperlipidemia    Encouraged heart healthy diet, increase exercise, avoid trans fats, consider a krill oil cap daily      Relevant Orders   Lipid panel   Osteopenia    Encouraged to get adequate exercise, calcium and vitamin d intake      Low serum vitamin D    Supplement and monitor      Relevant Orders   VITAMIN D 25 Hydroxy (Vit-D Deficiency, Fractures)   Gout    Recent flare in right foot and doing well now      DM (diabetes mellitus), type 2 ([facility])    hgba1c acceptable, minimize simple carbs. Increase exercise as tolerated. Continue current meds, given rx for glucometer to check daily and prn      Relevant Orders   Hemoglobin A1c   Comprehensive metabolic panel    Other Visit Diagnoses    Needs flu shot    -  Primary   Relevant Orders   Flu vaccine HIGH DOSE PF (Fluzone High dose) (Completed)      I am having Ileana Ladd. [place] maintain her diphenhydramine-acetaminophen, glucosamine-chondroitin, meclizine, omeprazole, famotidine, hyoscyamine, aspirin EC, Fish Oil, colchicine, atorvastatin, nystatin cream, carvedilol, Vitamin D (Ergocalciferol), losartan, furosemide, and allopurinol.  No orders of the defined types were placed in this encounter.    Penni Homans, MD

## 2017-10-08 NOTE — Assessment & Plan Note (Signed)
Avoid offending foods, start probiotics. Do not eat large meals in late evening and consider raising head of bed.  

## 2017-10-08 NOTE — Assessment & Plan Note (Signed)
Struggled with sinusitis and pneumonia since last visit and she was treated and feels well now.

## 2017-10-08 NOTE — Assessment & Plan Note (Signed)
Encouraged heart healthy diet, increase exercise, avoid trans fats, consider a krill oil cap daily 

## 2017-10-08 NOTE — Assessment & Plan Note (Signed)
hgba1c acceptable, minimize simple carbs. Increase exercise as tolerated. Continue current meds, given rx for glucometer to check daily and prn

## 2017-10-08 NOTE — Assessment & Plan Note (Signed)
Recent flare in right foot and doing well now

## 2017-10-08 NOTE — Patient Instructions (Addendum)
Shingrix is the new shingles shot 2 shots over 2-6 months at pharmacy  Carbohydrate Counting for Diabetes Mellitus, Adult Carbohydrate counting is a method for keeping track of how many carbohydrates you eat. Eating carbohydrates naturally increases the amount of sugar (glucose) in the blood. Counting how many carbohydrates you eat helps keep your blood glucose within normal limits, which helps you manage your diabetes (diabetes mellitus). It is important to know how many carbohydrates you can safely have in each meal. This is different for every person. A diet and nutrition specialist (registered dietitian) can help you make a meal plan and calculate how many carbohydrates you should have at each meal and snack. Carbohydrates are found in the following foods:  Grains, such as breads and cereals.  Dried beans and soy products.  Starchy vegetables, such as potatoes, peas, and corn.  Fruit and fruit juices.  Milk and yogurt.  Sweets and snack foods, such as cake, cookies, candy, chips, and soft drinks.  How do I count carbohydrates? There are two ways to count carbohydrates in food. You can use either of the methods or a combination of both. Reading "Nutrition Facts" on packaged food The "Nutrition Facts" list is included on the labels of almost all packaged foods and beverages in the [place] It includes:  The serving size.  Information about nutrients in each serving, including the grams (g) of carbohydrate per serving.  To use the "Nutrition Facts":  Decide how many servings you will have.  Multiply the number of servings by the number of carbohydrates per serving.  The resulting number is the total amount of carbohydrates that you will be having.  Learning standard serving sizes of other foods When you eat foods containing carbohydrates that are not packaged or do not include "Nutrition Facts" on the label, you need to measure the servings in order to count the amount of  carbohydrates:  Measure the foods that you will eat with a food scale or measuring cup, if needed.  Decide how many standard-size servings you will eat.  Multiply the number of servings by 15. Most carbohydrate-rich foods have about 15 g of carbohydrates per serving. ? For example, if you eat 8 oz (170 g) of strawberries, you will have eaten 2 servings and 30 g of carbohydrates (2 servings x 15 g = 30 g).  For foods that have more than one food mixed, such as soups and casseroles, you must count the carbohydrates in each food that is included.  The following list contains standard serving sizes of common carbohydrate-rich foods. Each of these servings has about 15 g of carbohydrates:   hamburger bun or  English muffin.   oz (15 mL) syrup.   oz (14 g) jelly.  1 slice of bread.  1 six-inch tortilla.  3 oz (85 g) cooked rice or pasta.  4 oz (113 g) cooked dried beans.  4 oz (113 g) starchy vegetable, such as peas, corn, or potatoes.  4 oz (113 g) hot cereal.  4 oz (113 g) mashed potatoes or  of a large baked potato.  4 oz (113 g) canned or frozen fruit.  4 oz (120 mL) fruit juice.  4-6 crackers.  6 chicken nuggets.  6 oz (170 g) unsweetened dry cereal.  6 oz (170 g) plain fat-free yogurt or yogurt sweetened with artificial sweeteners.  8 oz (240 mL) milk.  8 oz (170 g) fresh fruit or one small piece of fruit.  24 oz (680 g) popped popcorn.  Example of carbohydrate counting Sample meal  3 oz (85 g) chicken breast.  6 oz (170 g) brown rice.  4 oz (113 g) corn.  8 oz (240 mL) milk.  8 oz (170 g) strawberries with sugar-free whipped topping. Carbohydrate calculation 1. Identify the foods that contain carbohydrates: ? Rice. ? Corn. ? Milk. ? Strawberries. 2. Calculate how many servings you have of each food: ? 2 servings rice. ? 1 serving corn. ? 1 serving milk. ? 1 serving strawberries. 3. Multiply each number of servings by 15 g: ? 2 servings  rice x 15 g = 30 g. ? 1 serving corn x 15 g = 15 g. ? 1 serving milk x 15 g = 15 g. ? 1 serving strawberries x 15 g = 15 g. 4. Add together all of the amounts to find the total grams of carbohydrates eaten: ? 30 g + 15 g + 15 g + 15 g = 75 g of carbohydrates total. This information is not intended to replace advice given to you by your health care provider. Make sure you discuss any questions you have with your health care provider. Document Released: 01/16/2005 Document Revised: 08/06/2015 Document Reviewed: 06/30/2015 Elsevier Interactive Patient Education  [employer].

## 2017-10-09 DIAGNOSIS — E119 Type 2 diabetes mellitus without complications: Secondary | ICD-10-CM

## 2017-10-11 DIAGNOSIS — R69 Illness, unspecified: Secondary | ICD-10-CM | POA: Diagnosis not present

## 2017-10-12 ENCOUNTER — Other Ambulatory Visit: Payer: Self-pay | Admitting: Internal Medicine

## 2017-11-29 ENCOUNTER — Ambulatory Visit (HOSPITAL_COMMUNITY)
Admission: RE | Admit: 2017-11-29 | Discharge: 2017-11-29 | Disposition: A | Discharge status: Home / Self Care | Payer: Medicare HMO | Source: Ambulatory Visit | Attending: Oncology | Admitting: Oncology

## 2017-11-29 ENCOUNTER — Inpatient Hospital Stay: Payer: Medicare HMO

## 2017-11-29 ENCOUNTER — Encounter (HOSPITAL_COMMUNITY): Payer: Self-pay

## 2017-11-29 ENCOUNTER — Telehealth: Payer: Self-pay | Admitting: Oncology

## 2017-11-29 ENCOUNTER — Other Ambulatory Visit: Payer: Self-pay

## 2017-11-29 ENCOUNTER — Inpatient Hospital Stay: Payer: Medicare HMO | Attending: Oncology | Admitting: Oncology

## 2017-11-29 ENCOUNTER — Other Ambulatory Visit: Payer: Self-pay | Admitting: Oncology

## 2017-11-29 VITALS — BP 118/55 | HR 76 | Temp 99.7°F | Resp 17 | Ht 64.0 in | Wt 215.4 lb

## 2017-11-29 DIAGNOSIS — K7460 Unspecified cirrhosis of liver: Secondary | ICD-10-CM | POA: Diagnosis not present

## 2017-11-29 DIAGNOSIS — Z85528 Personal history of other malignant neoplasm of kidney: Secondary | ICD-10-CM | POA: Diagnosis not present

## 2017-11-29 DIAGNOSIS — Z905 Acquired absence of kidney: Secondary | ICD-10-CM

## 2017-11-29 DIAGNOSIS — C649 Malignant neoplasm of unspecified kidney, except renal pelvis: Secondary | ICD-10-CM

## 2017-11-29 DIAGNOSIS — R7989 Other specified abnormal findings of blood chemistry: Secondary | ICD-10-CM | POA: Diagnosis not present

## 2017-11-29 DIAGNOSIS — R599 Enlarged lymph nodes, unspecified: Secondary | ICD-10-CM | POA: Insufficient documentation

## 2017-11-29 DIAGNOSIS — N2889 Other specified disorders of kidney and ureter: Secondary | ICD-10-CM | POA: Diagnosis not present

## 2017-11-29 DIAGNOSIS — R0609 Other forms of dyspnea: Secondary | ICD-10-CM | POA: Diagnosis not present

## 2017-11-29 DIAGNOSIS — D49511 Neoplasm of unspecified behavior of right kidney: Secondary | ICD-10-CM | POA: Diagnosis not present

## 2017-11-29 LAB — CBC WITH DIFFERENTIAL (CANCER CENTER ONLY)
ABS IMMATURE GRANULOCYTES: 0.04 10*3/uL (ref 0.00–0.07)
BASOS ABS: 0.1 10*3/uL (ref 0.0–0.1)
BASOS PCT: 1 %
EOS ABS: 0.2 10*3/uL (ref 0.0–0.5)
Eosinophils Relative: 2 %
HCT: 41.5 % (ref 36.0–46.0)
Hemoglobin: 13.4 g/dL (ref 12.0–15.0)
IMMATURE GRANULOCYTES: 0 %
LYMPHS ABS: 2.7 10*3/uL (ref 0.7–4.0)
Lymphocytes Relative: 27 %
MCH: 32 pg (ref 26.0–34.0)
MCHC: 32.3 g/dL (ref 30.0–36.0)
MCV: 99 fL (ref 80.0–100.0)
Monocytes Absolute: 0.7 10*3/uL (ref 0.1–1.0)
Monocytes Relative: 7 %
NEUTROS ABS: 6.3 10*3/uL (ref 1.7–7.7)
NEUTROS PCT: 63 %
NRBC: 0 % (ref 0.0–0.2)
PLATELETS: 239 10*3/uL (ref 150–400)
RBC: 4.19 MIL/uL (ref 3.87–5.11)
RDW: 14.5 % (ref 11.5–15.5)
WBC Count: 10 10*3/uL (ref 4.0–10.5)

## 2017-11-29 LAB — CMP (CANCER CENTER ONLY)
ALK PHOS: 66 U/L (ref 38–126)
ALT: 59 U/L — AB (ref 0–44)
AST: 64 U/L — ABNORMAL HIGH (ref 15–41)
Albumin: 4 g/dL (ref 3.5–5.0)
Anion gap: 9 (ref 5–15)
BUN: 22 mg/dL (ref 8–23)
CO2: 29 mmol/L (ref 22–32)
Calcium: 10.5 mg/dL — ABNORMAL HIGH (ref 8.9–10.3)
Chloride: 102 mmol/L (ref 98–111)
Creatinine: 1.29 mg/dL — ABNORMAL HIGH (ref 0.44–1.00)
GFR, EST AFRICAN AMERICAN: 46 mL/min — AB (ref >60)
GFR, Estimated: 40 mL/min — ABNORMAL LOW (ref >60)
GLUCOSE: 122 mg/dL — AB (ref 70–99)
POTASSIUM: 4.3 mmol/L (ref 3.5–5.1)
Sodium: 140 mmol/L (ref 135–145)
TOTAL PROTEIN: 7.9 g/dL (ref 6.5–8.1)
Total Bilirubin: 1.1 mg/dL (ref 0.3–1.2)

## 2017-11-29 MED ORDER — SODIUM CHLORIDE 0.9 % IJ SOLN
INTRAMUSCULAR | Status: AC
  Filled 2017-11-29: qty 50

## 2017-11-29 MED ORDER — IOHEXOL 300 MG/ML  SOLN
100.0000 mL | Freq: Once | INTRAMUSCULAR | Status: AC | PRN
  Administered 2017-11-29: 80 mL via INTRAVENOUS

## 2017-11-29 NOTE — Telephone Encounter (Signed)
Appts scheduled avs/calendar printed per 10/31 los °

## 2017-11-29 NOTE — Progress Notes (Addendum)
Hematology and Oncology Follow Up Visit  Amanda Crawford [id number] 03/14/1945 72 y.o. 11/29/2017 1:00 PM  CC: Nicanor Alcon, M.D.  Priscille Heidelberg Little, M.D.    Principle Diagnosis: 72 year old woman with stage IV renal cell carcinoma currently without any active disease.  She was found to have T2N0 renal cell carcinoma diagnosed in 2001.  She developed lung metastasis on separate occasions that have been resected.  Prior Therapy:  She is status post left nephrectomy in 2001.  She developed a left lingular mass, status post VATS procedure.  Pathology revealed a recurrent renal cell carcinoma resected in October 2008.   She developed relapse disease in 2014 and subsequently underwent a VATS and left upper lobe wedge resection of a tumor done on 05/09/2012. Pathology confirmed another relapse.  Current therapy: Active surveillance.  Interim History: Amanda Crawford returns today for a repeat evaluation.  Since the last visit, she reports no major changes in her health.  She did have a episode of gout as well as stress fracture in her foot which she is recovering well at this time.  She does not report any flank pain or hematuria.  She does report some mild dyspnea exertion that has been chronic in nature.  He denies any cough or hemoptysis.  Her quality of life and performance status remains unchanged.  She does not report any headaches, blurry vision, syncope or seizures.  She denies any alteration mental status or confusion.  She does not report any fevers, chills or sweats.  He denies any weight loss or appetite changes.  She does not report any chest pain, palpitation, orthopnea or leg edema.  She has not reported any  abdominal pain or early satiety.  She has not reported any melena or hematochezia.  She denies any changes in her bowel habits.  She does not report any frequency urgency or hesitancy. She does not report any arthralgias or myalgias.  She does not report any bleeding or clotting  tendency.  Does not report any lymphadenopathy or petechiae. Rest of the review of systems is negative.   Medications: I have reviewed the patient's current medications. Current Outpatient Medications  Medication Sig Dispense Refill  . allopurinol (ZYLOPRIM) 300 MG tablet Take 1 tablet (300 mg total) by mouth daily. 30 tablet 3  . aspirin EC 81 MG tablet Take 81 mg by mouth daily.    Marland Kitchen atorvastatin (LIPITOR) 10 MG tablet Take 1 tablet (10 mg total) by mouth every other day. 45 tablet 1  . carvedilol (COREG) 6.25 MG tablet Take 1 tablet (6.25 mg total) by mouth 2 (two) times daily. KEEP OV. 180 tablet 0  . colchicine 0.6 MG tablet TAKE 2 TABS BY MOUTH ONCE THEN 1 TAB EVERY 2 HOURS TIL RESOLVED OR MAX OF 6 TABS IN 24 HOURS.  2  . diphenhydramine-acetaminophen (TYLENOL PM) 25-500 MG TABS Take 1 tablet by mouth at bedtime as needed. For insomnia    . famotidine (PEPCID) 20 MG tablet Take 20 mg by mouth at bedtime as needed for heartburn or indigestion.     . furosemide (LASIX) 40 MG tablet Take 40 mg by mouth daily as needed.    . furosemide (LASIX) 40 MG tablet TAKE 1 TABLET BY MOUTH EVERY DAY 90 tablet 3  . glucosamine-chondroitin 500-400 MG tablet Take 1 tablet by mouth 2 (two) times a week.     . hyoscyamine (LEVSIN SL) 0.125 MG SL tablet Place 1 tablet (0.125 mg total) under the tongue every  4 (four) hours as needed. (Patient taking differently: Place 0.125 mg under the tongue every 4 (four) hours as needed for cramping. ) 30 tablet 1  . losartan (COZAAR) 25 MG tablet Take 1 tablet (25 mg total) by mouth daily. 90 tablet 3  . meclizine (ANTIVERT) 12.5 MG tablet Take 1 tablet (12.5 mg total) by mouth 3 (three) times daily as needed for dizziness. As needed for   Dizziness or nausea 30 tablet 1  . nystatin cream (MYCOSTATIN) Apply 1 application topically 2 (two) times daily. 30 g 1  . Omega-3 Fatty Acids (FISH OIL) 1000 MG CAPS Take 1 capsule (1,000 mg total) by mouth 2 (two) times daily.  0  .  omeprazole (PRILOSEC OTC) 20 MG tablet Take 20 mg by mouth daily as needed (acid reflux).     . Vitamin D, Ergocalciferol, (DRISDOL) 50000 units CAPS capsule Take 1 capsule (50,000 Units total) by mouth every 7 (seven) days. 4 capsule 4   No current facility-administered medications for this visit.      Allergies: No Known Allergies  Past Medical History, Surgical history, Social history, and Family History remained unchanged on review.   Physical Exam: Blood pressure (!) 118/55, pulse 76, temperature 99.7 F (37.6 C), temperature source Oral, resp. rate 17, height 5\' 4"  (1.626 m), weight 215 lb 6.4 oz (97.7 kg), SpO2 95 %.    ECOG: 1   General appearance: Comfortable appearing without any discomfort Head: Normocephalic without any trauma Oropharynx: Mucous membranes are moist and pink without any thrush or ulcers. Eyes: Pupils are equal and round reactive to light. Lymph nodes: No cervical, supraclavicular, inguinal or axillary lymphadenopathy.   Heart:regular rate and rhythm.  S1 and S2 without leg edema. Lung: Clear without any rhonchi or wheezes.  No dullness to percussion. Abdomin: Soft, nontender, nondistended with good bowel sounds.  No hepatosplenomegaly. Musculoskeletal: No joint deformity or effusion.  Full range of motion noted. Neurological: No deficits noted on motor, sensory and deep tendon reflex exam. Skin: No petechial rash or dryness.  Appeared moist.    Lab Results: Lab Results  Component Value Date   WBC 10.5 10/08/2017   HGB 13.7 10/08/2017   HCT 41.2 10/08/2017   MCV 97.8 10/08/2017   PLT 237.0 10/08/2017     Chemistry      Component Value Date/Time   NA 139 10/08/2017 1140   NA 139 08/23/2017 0933   NA 140 11/01/2016 0818   K 5.2 (H) 10/08/2017 1140   K 4.4 11/01/2016 0818   CL 102 10/08/2017 1140   CL 107 04/16/2012 0923   CO2 30 10/08/2017 1140   CO2 26 11/01/2016 0818   BUN 27 (H) 10/08/2017 1140   BUN 24 08/23/2017 0933   BUN 17.9  11/01/2016 0818   CREATININE 0.99 10/08/2017 1140   CREATININE 1.1 11/01/2016 0818      Component Value Date/Time   CALCIUM 10.7 (H) 10/08/2017 1140   CALCIUM 10.3 11/01/2016 0818   ALKPHOS 61 10/08/2017 1140   ALKPHOS 72 11/01/2016 0818   AST 53 (H) 10/08/2017 1140   AST 75 (H) 11/01/2016 0818   ALT 48 (H) 10/08/2017 1140   ALT 75 (H) 11/01/2016 0818   BILITOT 1.0 10/08/2017 1140   BILITOT 0.78 11/01/2016 0818     CLINICAL DATA:  72 year old female with history of left-sided renal cancer diagnosed in 2001 with known lung metastasis. Status post left nephrectomy. Two prior wedge resections in the lungs.  EXAM: CT CHEST, ABDOMEN,  AND PELVIS WITH CONTRAST  TECHNIQUE: Multidetector CT imaging of the chest, abdomen and pelvis was performed following the standard protocol during bolus administration of intravenous contrast.  CONTRAST:  164mL ISOVUE-300 IOPAMIDOL (ISOVUE-300) INJECTION 61%  COMPARISON:  CT the chest, abdomen and pelvis 11/01/2016.  FINDINGS: CT CHEST FINDINGS  Cardiovascular: Heart size is normal. There is no significant pericardial fluid, thickening or pericardial calcification. There is aortic atherosclerosis, as well as atherosclerosis of the great vessels of the mediastinum and the coronary arteries, including calcified atherosclerotic plaque in the left main, left anterior descending and left circumflex coronary arteries.  Mediastinum/Nodes: No pathologically enlarged mediastinal or hilar lymph nodes. Esophagus is unremarkable in appearance. No axillary lymphadenopathy.  Lungs/Pleura: No suspicious appearing pulmonary nodules or masses are noted. No acute consolidative airspace disease. No pleural effusions. Suture line in the left upper lobe from prior wedge resection.  Musculoskeletal: There are no aggressive appearing lytic or blastic lesions noted in the visualized portions of the skeleton.  CT ABDOMEN PELVIS  FINDINGS  Hepatobiliary: Diffuse low attenuation throughout the hepatic parenchyma, indicative of hepatic steatosis. Liver has a slightly nodular contour, suggesting underlying cirrhosis. Subcentimeter low-attenuation lesion in segment 8 of the liver, too small to definitively characterize, but similar in retrospect to prior studies, likely a tiny cyst. No other larger more suspicious appearing cystic or solid hepatic lesions. No intra or extrahepatic biliary ductal dilatation. Status post cholecystectomy.  Pancreas: No pancreatic mass. No pancreatic ductal dilatation. No pancreatic or peripancreatic fluid or inflammatory changes.  Spleen: Unremarkable.  Adrenals/Urinary Tract: Right kidney and bilateral adrenal glands are normal in appearance. Status post left nephrectomy. No unexpected soft tissue mass noted in the left nephrectomy bed to suggest locally recurrent disease. No right-sided hydroureteronephrosis. Urinary bladder is normal in appearance.  Stomach/Bowel: Normal appearance of the stomach. No pathologic dilatation of small bowel or colon. Numerous colonic diverticulae are noted, without surrounding inflammatory changes to suggest an acute diverticulitis at this time. The appendix is not confidently identified and may be surgically absent. Regardless, there are no inflammatory changes noted adjacent to the cecum to suggest the presence of an acute appendicitis at this time.  Vascular/Lymphatic: Aortic atherosclerosis, without evidence of aneurysm or dissection in the abdominal or pelvic vasculature. Borderline enlarged 9 mm short axis celiac axis lymph node, similar to the prior examination and nonspecific. No other lymphadenopathy noted in the abdomen or pelvis.  Reproductive: Status post hysterectomy. Ovaries are not confidently identified may be surgically absent or atrophic.  Other: No significant volume of ascites.  No  pneumoperitoneum.  Musculoskeletal: There are no aggressive appearing lytic or blastic lesions noted in the visualized portions of the skeleton.  IMPRESSION: 1. Status post left nephrectomy. No findings to suggest local recurrence of disease. There are also postoperative changes of left upper lobe wedge resection. No findings to suggest recurrent metastatic disease in the chest, abdomen or pelvis on today's examination. 2. Morphologic changes in the liver suggesting underlying cirrhosis. 3. Aortic atherosclerosis, in addition to left main and 3 vessel coronary artery disease. Assessment for potential risk factor modification, dietary therapy or pharmacologic therapy may be warranted, if clinically indicated. 4. Mild colonic diverticulosis without findings to suggest an acute diverticulitis at this time. 5. Additional incidental findings, as above.   Impression and plan:  72 year old woman with the following issues:  1.  Stage IV renal cell carcinoma without any detected disease.  She was initially diagnosed with T2N0 kidney cancer and 2001.    She remains  on active surveillance at this time without any evidence of recurrent disease.  Her last CT scan obtained in May 2019 did not show any disease relapse.  Risks and benefits of continue active surveillance at this time was reviewed.  Plan is to proceed with imaging studies today and in 6 months.  Systemic therapy options were reviewed and will be implemented if he develops recurrent disease.  2.  Elevated liver function test: Her AST was 53 and ALT of 48 in September 2019.  Likely related to cirrhosis of the liver.  There appears to be well compensated in general.  3.  Dyspnea on exertion: Appears chronic in nature.  Etiology could be related to deconditioning and possibly COPD.  She will have repeat imaging studies in the immediate future to evaluate cancer recurrence however.  4.  Follow-up: We will be in 6 months for repeat  evaluation after CT scan.  15  minutes was spent with the patient face-to-face today.  More than 50% of time was dedicated to reviewing her disease status, imaging studies and coordinating plan of care.    Zola Button, MD 10/31/20191:00 PM    CT scan obtained on November 29, 2017 was personally reviewed and discussed with the patient today over the phone.  She has no evidence of metastatic disease but there is a worrisome growth on her right kidney.  This could represent a new neoplasm of the right kidney that might require intervention and monitoring.  The plan is to present her case at the [facility] tumor board and consider the next step.  The options include to close surveillance with repeat MRI and 3 months, cryoablation by interventional radiology or a partial nephrectomy to be done by urology.  I will communicate the final decision after tumor board discussion in 1 week.  She understands this and will let me know if she has any other questions.  Zola Button MD 11/30/17

## 2017-11-30 ENCOUNTER — Telehealth: Payer: Self-pay

## 2017-11-30 NOTE — Telephone Encounter (Signed)
Per 10/31 patient checked out by Sharyne Peach

## 2018-01-02 ENCOUNTER — Other Ambulatory Visit: Payer: Self-pay | Admitting: Oncology

## 2018-01-02 ENCOUNTER — Telehealth: Payer: Self-pay | Admitting: *Deleted

## 2018-01-02 DIAGNOSIS — C649 Malignant neoplasm of unspecified kidney, except renal pelvis: Secondary | ICD-10-CM

## 2018-01-02 NOTE — Telephone Encounter (Signed)
Spoke with patient, per dr Alen Blew, she will have an MRI of the kidney scheduled in jan 2020 and f/u visit with dr Alen Blew after MRI

## 2018-01-02 NOTE — Telephone Encounter (Signed)
-----   Message from Wyatt Portela, MD sent at 01/02/2018  2:04 PM EST ----- Please let her know that she will get an MRI of the kidney in 01/2018 and MD follow up after that.  ----- Message ----- From: Randolm Idol, RN Sent: 01/02/2018   1:54 PM EST To: Wyatt Portela, MD  Patient states she had a CT 11/29/17 and has not heard back from the office [id number]

## 2018-01-03 ENCOUNTER — Telehealth: Payer: Self-pay | Admitting: Oncology

## 2018-01-03 NOTE — Telephone Encounter (Signed)
Scheduled appt per 12/4 sch message - left message for patient with appt date and time

## 2018-01-04 ENCOUNTER — Other Ambulatory Visit: Payer: Self-pay | Admitting: Family Medicine

## 2018-01-05 ENCOUNTER — Other Ambulatory Visit: Payer: Self-pay | Admitting: Family Medicine

## 2018-01-05 DIAGNOSIS — R69 Illness, unspecified: Secondary | ICD-10-CM | POA: Diagnosis not present

## 2018-01-29 ENCOUNTER — Other Ambulatory Visit: Payer: Self-pay | Admitting: *Deleted

## 2018-01-29 MED ORDER — LORAZEPAM 1 MG PO TABS: 1.0000 mg | ORAL_TABLET | Freq: Three times a day (TID) | ORAL | 0 refills | Status: DC

## 2018-01-29 NOTE — Telephone Encounter (Signed)
Called in #6 ativan 1 mg tablets. Take one prior to MRI appt. Patient notified.

## 2018-02-11 ENCOUNTER — Ambulatory Visit (INDEPENDENT_AMBULATORY_CARE_PROVIDER_SITE_OTHER): Payer: Medicare HMO | Admitting: Family Medicine

## 2018-02-11 VITALS — BP 122/62 | HR 77 | Temp 98.1°F | Resp 18 | Ht 64.0 in | Wt 216.4 lb

## 2018-02-11 DIAGNOSIS — I5022 Chronic systolic (congestive) heart failure: Secondary | ICD-10-CM

## 2018-02-11 DIAGNOSIS — M1A9XX0 Chronic gout, unspecified, without tophus (tophi): Secondary | ICD-10-CM

## 2018-02-11 DIAGNOSIS — R300 Dysuria: Secondary | ICD-10-CM | POA: Diagnosis not present

## 2018-02-11 DIAGNOSIS — R7989 Other specified abnormal findings of blood chemistry: Secondary | ICD-10-CM

## 2018-02-11 DIAGNOSIS — E119 Type 2 diabetes mellitus without complications: Secondary | ICD-10-CM | POA: Diagnosis not present

## 2018-02-11 DIAGNOSIS — E785 Hyperlipidemia, unspecified: Secondary | ICD-10-CM | POA: Diagnosis not present

## 2018-02-11 DIAGNOSIS — N3000 Acute cystitis without hematuria: Secondary | ICD-10-CM

## 2018-02-11 LAB — TSH: TSH: 2.49 u[IU]/mL (ref 0.35–4.50)

## 2018-02-11 LAB — LIPID PANEL
CHOLESTEROL: 194 mg/dL (ref 0–200)
HDL: 48.7 mg/dL (ref >39.00)
NonHDL: 144.85
TRIGLYCERIDES: 287 mg/dL — AB (ref 0.0–149.0)
Total CHOL/HDL Ratio: 4
VLDL: 57.4 mg/dL — AB (ref 0.0–40.0)

## 2018-02-11 LAB — URINALYSIS, ROUTINE W REFLEX MICROSCOPIC
Bilirubin Urine: NEGATIVE
Ketones, ur: NEGATIVE
NITRITE: NEGATIVE
RBC / HPF: NONE SEEN (ref 0–?)
Specific Gravity, Urine: 1.02 (ref 1.000–1.030)
TOTAL PROTEIN, URINE-UPE24: NEGATIVE
Urine Glucose: NEGATIVE
Urobilinogen, UA: 0.2 (ref 0.0–1.0)
pH: 6 (ref 5.0–8.0)

## 2018-02-11 LAB — COMPREHENSIVE METABOLIC PANEL
ALK PHOS: 63 U/L (ref 39–117)
ALT: 48 U/L — AB (ref 0–35)
AST: 54 U/L — ABNORMAL HIGH (ref 0–37)
Albumin: 4.1 g/dL (ref 3.5–5.2)
BILIRUBIN TOTAL: 0.7 mg/dL (ref 0.2–1.2)
BUN: 21 mg/dL (ref 6–23)
CALCIUM: 10.5 mg/dL (ref 8.4–10.5)
CO2: 27 mEq/L (ref 19–32)
Chloride: 103 mEq/L (ref 96–112)
Creatinine, Ser: 0.96 mg/dL (ref 0.40–1.20)
GFR: 60.25 mL/min (ref >60.00)
GLUCOSE: 131 mg/dL — AB (ref 70–99)
POTASSIUM: 4.6 meq/L (ref 3.5–5.1)
Sodium: 138 mEq/L (ref 135–145)
TOTAL PROTEIN: 7.1 g/dL (ref 6.0–8.3)

## 2018-02-11 LAB — URIC ACID: Uric Acid, Serum: 5.2 mg/dL (ref 2.4–7.0)

## 2018-02-11 LAB — CBC
HEMATOCRIT: 39 % (ref 36.0–46.0)
HEMOGLOBIN: 13 g/dL (ref 12.0–15.0)
MCHC: 33.3 g/dL (ref 30.0–36.0)
MCV: 98.6 fl (ref 78.0–100.0)
PLATELETS: 189 10*3/uL (ref 150.0–400.0)
RBC: 3.96 Mil/uL (ref 3.87–5.11)
RDW: 15.5 % (ref 11.5–15.5)
WBC: 9.2 10*3/uL (ref 4.0–10.5)

## 2018-02-11 LAB — LDL CHOLESTEROL, DIRECT: Direct LDL: 85 mg/dL

## 2018-02-11 LAB — HEMOGLOBIN A1C: Hgb A1c MFr Bld: 7.2 % — ABNORMAL HIGH (ref 4.6–6.5)

## 2018-02-11 LAB — VITAMIN D 25 HYDROXY (VIT D DEFICIENCY, FRACTURES): VITD: 44.4 ng/mL (ref 30.00–100.00)

## 2018-02-11 NOTE — Assessment & Plan Note (Signed)
hgba1c acceptable, minimize simple carbs. Increase exercise as tolerated. Continue current meds 

## 2018-02-11 NOTE — Assessment & Plan Note (Addendum)
encouraged heart healthy diet, avoid trans fats, minimize simple carbs and saturated fats. Increase exercise as tolerated 

## 2018-02-11 NOTE — Assessment & Plan Note (Signed)
Encouraged DASH diet, decrease po intake and increase exercise as tolerated. Needs 7-8 hours of sleep nightly. Avoid trans fats, eat small, frequent meals every 4-5 hours with lean proteins, complex carbs and healthy fats. Minimize simple carbs 

## 2018-02-11 NOTE — Assessment & Plan Note (Signed)
No recent flares. Stay well hydrated

## 2018-02-11 NOTE — Assessment & Plan Note (Signed)
No recent flares 

## 2018-02-11 NOTE — Assessment & Plan Note (Signed)
Supplement and monitor 

## 2018-02-11 NOTE — Patient Instructions (Signed)
Carbohydrate Counting for Diabetes Mellitus, Adult  Carbohydrate counting is a method of keeping track of how many carbohydrates you eat. Eating carbohydrates naturally increases the amount of sugar (glucose) in the blood. Counting how many carbohydrates you eat helps keep your blood glucose within normal limits, which helps you manage your diabetes (diabetes mellitus). It is important to know how many carbohydrates you can safely have in each meal. This is different for every person. A diet and nutrition specialist (registered dietitian) can help you make a meal plan and calculate how many carbohydrates you should have at each meal and snack. Carbohydrates are found in the following foods:  Grains, such as breads and cereals.  Dried beans and soy products.  Starchy vegetables, such as potatoes, peas, and corn.  Fruit and fruit juices.  Milk and yogurt.  Sweets and snack foods, such as cake, cookies, candy, chips, and soft drinks. How do I count carbohydrates? There are two ways to count carbohydrates in food. You can use either of the methods or a combination of both. Reading "Nutrition Facts" on packaged food The "Nutrition Facts" list is included on the labels of almost all packaged foods and beverages in the [place] It includes:  The serving size.  Information about nutrients in each serving, including the grams (g) of carbohydrate per serving. To use the "Nutrition Facts":  Decide how many servings you will have.  Multiply the number of servings by the number of carbohydrates per serving.  The resulting number is the total amount of carbohydrates that you will be having. Learning standard serving sizes of other foods When you eat carbohydrate foods that are not packaged or do not include "Nutrition Facts" on the label, you need to measure the servings in order to count the amount of carbohydrates:  Measure the foods that you will eat with a food scale or measuring cup, if needed.   Decide how many standard-size servings you will eat.  Multiply the number of servings by 15. Most carbohydrate-rich foods have about 15 g of carbohydrates per serving. ? For example, if you eat 8 oz (170 g) of strawberries, you will have eaten 2 servings and 30 g of carbohydrates (2 servings x 15 g = 30 g).  For foods that have more than one food mixed, such as soups and casseroles, you must count the carbohydrates in each food that is included. The following list contains standard serving sizes of common carbohydrate-rich foods. Each of these servings has about 15 g of carbohydrates:   hamburger bun or  English muffin.   oz (15 mL) syrup.   oz (14 g) jelly.  1 slice of bread.  1 six-inch tortilla.  3 oz (85 g) cooked rice or pasta.  4 oz (113 g) cooked dried beans.  4 oz (113 g) starchy vegetable, such as peas, corn, or potatoes.  4 oz (113 g) hot cereal.  4 oz (113 g) mashed potatoes or  of a large baked potato.  4 oz (113 g) canned or frozen fruit.  4 oz (120 mL) fruit juice.  4-6 crackers.  6 chicken nuggets.  6 oz (170 g) unsweetened dry cereal.  6 oz (170 g) plain fat-free yogurt or yogurt sweetened with artificial sweeteners.  8 oz (240 mL) milk.  8 oz (170 g) fresh fruit or one small piece of fruit.  24 oz (680 g) popped popcorn. Example of carbohydrate counting Sample meal  3 oz (85 g) chicken breast.  6 oz (170 g)   brown rice.  4 oz (113 g) corn.  8 oz (240 mL) milk.  8 oz (170 g) strawberries with sugar-free whipped topping. Carbohydrate calculation 1. Identify the foods that contain carbohydrates: ? Rice. ? Corn. ? Milk. ? Strawberries. 2. Calculate how many servings you have of each food: ? 2 servings rice. ? 1 serving corn. ? 1 serving milk. ? 1 serving strawberries. 3. Multiply each number of servings by 15 g: ? 2 servings rice x 15 g = 30 g. ? 1 serving corn x 15 g = 15 g. ? 1 serving milk x 15 g = 15 g. ? 1 serving  strawberries x 15 g = 15 g. 4. Add together all of the amounts to find the total grams of carbohydrates eaten: ? 30 g + 15 g + 15 g + 15 g = 75 g of carbohydrates total. Summary  Carbohydrate counting is a method of keeping track of how many carbohydrates you eat.  Eating carbohydrates naturally increases the amount of sugar (glucose) in the blood.  Counting how many carbohydrates you eat helps keep your blood glucose within normal limits, which helps you manage your diabetes.  A diet and nutrition specialist (registered dietitian) can help you make a meal plan and calculate how many carbohydrates you should have at each meal and snack. This information is not intended to replace advice given to you by your health care provider. Make sure you discuss any questions you have with your health care provider. Document Released: 01/16/2005 Document Revised: 07/26/2016 Document Reviewed: 06/30/2015 Elsevier Interactive Patient Education  2019 [employer]  

## 2018-02-12 ENCOUNTER — Ambulatory Visit (HOSPITAL_COMMUNITY)
Admission: RE | Admit: 2018-02-12 | Discharge: 2018-02-12 | Disposition: A | Discharge status: Home / Self Care | Payer: Medicare HMO | Source: Ambulatory Visit | Attending: Oncology | Admitting: Oncology

## 2018-02-12 ENCOUNTER — Inpatient Hospital Stay: Payer: Medicare HMO | Attending: Oncology

## 2018-02-12 DIAGNOSIS — Z79899 Other long term (current) drug therapy: Secondary | ICD-10-CM | POA: Diagnosis not present

## 2018-02-12 DIAGNOSIS — Z85528 Personal history of other malignant neoplasm of kidney: Secondary | ICD-10-CM | POA: Insufficient documentation

## 2018-02-12 DIAGNOSIS — K219 Gastro-esophageal reflux disease without esophagitis: Secondary | ICD-10-CM | POA: Insufficient documentation

## 2018-02-12 DIAGNOSIS — Z7982 Long term (current) use of aspirin: Secondary | ICD-10-CM | POA: Insufficient documentation

## 2018-02-12 DIAGNOSIS — N289 Disorder of kidney and ureter, unspecified: Secondary | ICD-10-CM | POA: Diagnosis not present

## 2018-02-12 DIAGNOSIS — C649 Malignant neoplasm of unspecified kidney, except renal pelvis: Secondary | ICD-10-CM | POA: Insufficient documentation

## 2018-02-12 LAB — CMP (CANCER CENTER ONLY)
ALK PHOS: 68 U/L (ref 38–126)
ALT: 61 U/L — ABNORMAL HIGH (ref 0–44)
AST: 73 U/L — ABNORMAL HIGH (ref 15–41)
Albumin: 3.9 g/dL (ref 3.5–5.0)
Anion gap: 8 (ref 5–15)
BILIRUBIN TOTAL: 1 mg/dL (ref 0.3–1.2)
BUN: 19 mg/dL (ref 8–23)
CO2: 28 mmol/L (ref 22–32)
Calcium: 10.1 mg/dL (ref 8.9–10.3)
Chloride: 104 mmol/L (ref 98–111)
Creatinine: 1.04 mg/dL — ABNORMAL HIGH (ref 0.44–1.00)
GFR, EST NON AFRICAN AMERICAN: 53 mL/min — AB (ref >60)
GFR, Est AFR Am: >60 mL/min (ref >60)
Glucose, Bld: 150 mg/dL — ABNORMAL HIGH (ref 70–99)
Potassium: 4.4 mmol/L (ref 3.5–5.1)
Sodium: 140 mmol/L (ref 135–145)
TOTAL PROTEIN: 7.5 g/dL (ref 6.5–8.1)

## 2018-02-12 MED ORDER — GADOBUTROL 1 MMOL/ML IV SOLN
9.0000 mL | Freq: Once | INTRAVENOUS | Status: AC | PRN
  Administered 2018-02-12: 9 mL via INTRAVENOUS

## 2018-02-13 DIAGNOSIS — R300 Dysuria: Secondary | ICD-10-CM | POA: Insufficient documentation

## 2018-02-13 LAB — URINE CULTURE
MICRO NUMBER: 46246
SPECIMEN QUALITY: ADEQUATE

## 2018-02-13 NOTE — Assessment & Plan Note (Signed)
Is awaiting an MR of her kidney due to recurrent episodes

## 2018-02-13 NOTE — Assessment & Plan Note (Signed)
Is awaiting MR of kidney. Check UA and culture

## 2018-02-13 NOTE — Progress Notes (Signed)
Subjective:    Patient ID: Amanda Crawford, female    DOB: 04-07-1945, 73 y.o.   MRN: [id number]  No chief complaint on file.   HPI Patient is in today for follow up. She is doing mostly well but has been struggling with recurrent urinary tract infections. She is awaiting an MR on her kidney. She is now noting dysuria, urinary hesitancy, frequency and urgency. No fevers or chills. No recent hospitalizations or other acute concerns. Denies CP/palp/SOB/HA/congestion/fevers/GI c/o. Taking meds as prescribed  Past Medical History:  Diagnosis Date  . Allergic state 03/12/2015  . Arthritis   . Colon polyp 11/19/2014  . Dermatitis 03/12/2015  . Diarrhea 06/29/2016  . DM (diabetes mellitus), type 2 ([place]) 06/29/2016  . GERD (gastroesophageal reflux disease)    occ  . Gout 03/12/2015  . H/O measles   . H/O mumps   . Headache(784.0)    migraines  . History of chicken pox 11/23/2014  . Hyperglycemia 06/29/2016  . Lung cancer ([place])   . Migraine 11/23/2014  . Obesity 11/23/2014  . Pneumonia    child  . Preventative health care 09/12/2015  . Renal cell cancer ([place])    renal cell ca dx 9/01 and 8/08;  . Shortness of breath    occ  . Skin lesion of breast 03/12/2015    Past Surgical History:  Procedure Laterality Date  . ABDOMINAL HYSTERECTOMY    . APPENDECTOMY    . CARDIAC CATHETERIZATION     yrs ago neg  . CHOLECYSTECTOMY    . LUNG CANCER SURGERY Left 08  . RENAL MASS EXCISION Left 01  . RIGHT/LEFT HEART CATH AND CORONARY ANGIOGRAPHY N/A 07/27/2016   Procedure: Right/Left Heart Cath and Coronary Angiography;  Surgeon: Leonie Man, MD;  Location: [facility];  Service: Cardiovascular;  Laterality: N/A;  . THORACOTOMY Left 05/09/2012   Procedure: THORACOTOMY MAJOR;  Surgeon: Gaye Pollack, MD;  Location: [facility];  Service: Thoracic;  Laterality: Left;  Marland Kitchen VIDEO BRONCHOSCOPY N/A 05/09/2012   Procedure: VIDEO BRONCHOSCOPY;  Surgeon: Gaye Pollack, MD;  Location: [facility];  Service:  Thoracic;  Laterality: N/A;  . WEDGE RESECTION Left 05/09/2012   Procedure: LEFT UPPER LOBE WEDGE RESECTION;  Surgeon: Gaye Pollack, MD;  Location: [facility] OR;  Service: Thoracic;  Laterality: Left;    Family History  Problem Relation Age of Onset  . Heart failure Father   . Asthma Brother   . Asthma Daughter     Social History   Socioeconomic History  . Marital status: Married    Spouse name: Not on file  . Number of children: Not on file  . Years of education: Not on file  . Highest education level: Not on file  Occupational History  . Not on file  Social Needs  . Financial resource strain: Not on file  . Food insecurity:    Worry: Not on file    Inability: Not on file  . Transportation needs:    Medical: Not on file    Non-medical: Not on file  Tobacco Use  . Smoking status: Former Smoker    Packs/day: 0.50    Years: 35.00    Pack years: 17.50    Types: Cigarettes    Last attempt to quit: 05/08/2006    Years since quitting: 11.7  . Smokeless tobacco: Never Used  Substance and Sexual Activity  . Alcohol use: No  . Drug use: No  . Sexual activity: Not on file  Comment: lives with husband, no dietary restrictions.   Lifestyle  . Physical activity:    Days per week: Not on file    Minutes per session: Not on file  . Stress: Not on file  Relationships  . Social connections:    Talks on phone: Not on file    Gets together: Not on file    Attends religious service: Not on file    Active member of club or organization: Not on file    Attends meetings of clubs or organizations: Not on file    Relationship status: Not on file  . Intimate partner violence:    Fear of current or ex partner: Not on file    Emotionally abused: Not on file    Physically abused: Not on file    Forced sexual activity: Not on file  Other Topics Concern  . Not on file  Social History Narrative  . Not on file    Outpatient Medications Prior to Visit  Medication Sig Dispense Refill  .  allopurinol (ZYLOPRIM) 300 MG tablet TAKE 1 TABLET BY MOUTH EVERY DAY 30 tablet 3  . aspirin EC 81 MG tablet Take 81 mg by mouth daily.    Marland Kitchen atorvastatin (LIPITOR) 10 MG tablet Take 1 tablet (10 mg total) by mouth every other day. 45 tablet 1  . carvedilol (COREG) 6.25 MG tablet Take 1 tablet (6.25 mg total) by mouth 2 (two) times daily. KEEP OV. 180 tablet 0  . colchicine 0.6 MG tablet TAKE 2 TABS BY MOUTH ONCE THEN 1 TAB EVERY 2 HOURS TIL RESOLVED OR MAX OF 6 TABS IN 24 HOURS.  2  . diphenhydramine-acetaminophen (TYLENOL PM) 25-500 MG TABS Take 1 tablet by mouth at bedtime as needed. For insomnia    . famotidine (PEPCID) 20 MG tablet Take 20 mg by mouth at bedtime as needed for heartburn or indigestion.     . furosemide (LASIX) 40 MG tablet Take 40 mg by mouth daily as needed.    . furosemide (LASIX) 40 MG tablet TAKE 1 TABLET BY MOUTH EVERY DAY 90 tablet 3  . glucosamine-chondroitin 500-400 MG tablet Take 1 tablet by mouth 2 (two) times a week.     . hyoscyamine (LEVSIN SL) 0.125 MG SL tablet Place 1 tablet (0.125 mg total) under the tongue every 4 (four) hours as needed. (Patient taking differently: Place 0.125 mg under the tongue every 4 (four) hours as needed for cramping. ) 30 tablet 1  . LORazepam (ATIVAN) 1 MG tablet Take 1 tablet (1 mg total) by mouth every 8 (eight) hours. 6 tablet 0  . losartan (COZAAR) 25 MG tablet Take 1 tablet (25 mg total) by mouth daily. 90 tablet 3  . meclizine (ANTIVERT) 12.5 MG tablet Take 1 tablet (12.5 mg total) by mouth 3 (three) times daily as needed for dizziness. As needed for   Dizziness or nausea 30 tablet 1  . nystatin cream (MYCOSTATIN) Apply 1 application topically 2 (two) times daily. 30 g 1  . Omega-3 Fatty Acids (FISH OIL) 1000 MG CAPS Take 1 capsule (1,000 mg total) by mouth 2 (two) times daily.  0  . omeprazole (PRILOSEC OTC) 20 MG tablet Take 20 mg by mouth daily as needed (acid reflux).     . Vitamin D, Ergocalciferol, (DRISDOL) 1.25 MG (50000  UT) CAPS capsule TAKE 1 CAPSULE (50,000 UNITS TOTAL) BY MOUTH EVERY 7 (SEVEN) DAYS. 4 capsule 4   No facility-administered medications prior to visit.  No Known Allergies  Review of Systems  Constitutional: Negative for fever and malaise/fatigue.  HENT: Negative for congestion.   Eyes: Negative for blurred vision.  Respiratory: Negative for shortness of breath.   Cardiovascular: Negative for chest pain, palpitations and leg swelling.  Gastrointestinal: Negative for abdominal pain, blood in stool and nausea.  Genitourinary: Positive for dysuria, frequency and urgency. Negative for flank pain and hematuria.  Musculoskeletal: Negative for falls.  Skin: Negative for rash.  Neurological: Negative for dizziness, loss of consciousness and headaches.  Endo/Heme/Allergies: Negative for environmental allergies.  Psychiatric/Behavioral: Negative for depression. The patient is not nervous/anxious.        Objective:    Physical Exam Vitals signs and nursing note reviewed.  Constitutional:      General: She is not in acute distress.    Appearance: She is well-developed.  HENT:     Head: Normocephalic and atraumatic.     Nose: Nose normal.  Eyes:     General:        Right eye: No discharge.        Left eye: No discharge.  Neck:     Musculoskeletal: Normal range of motion and neck supple.  Cardiovascular:     Rate and Rhythm: Normal rate and regular rhythm.     Heart sounds: No murmur.  Pulmonary:     Effort: Pulmonary effort is normal.     Breath sounds: Normal breath sounds.  Abdominal:     General: Bowel sounds are normal.     Palpations: Abdomen is soft.     Tenderness: There is no abdominal tenderness.  Skin:    General: Skin is warm and dry.  Neurological:     Mental Status: She is alert and oriented to person, place, and time.     BP 122/62 (BP Location: Left Arm, Patient Position: Sitting, Cuff Size: Normal)   Pulse 77   Temp 98.1 F (36.7 C) (Oral)   Resp 18    Ht 5' 4"  (1.626 m)   Wt 216 lb 6.4 oz (98.2 kg)   SpO2 97%   BMI 37.14 kg/m  Wt Readings from Last 3 Encounters:  02/11/18 216 lb 6.4 oz (98.2 kg)  11/29/17 215 lb 6.4 oz (97.7 kg)  10/08/17 214 lb (97.1 kg)     Lab Results  Component Value Date   WBC 9.2 02/11/2018   HGB 13.0 02/11/2018   HCT 39.0 02/11/2018   PLT 189.0 02/11/2018   GLUCOSE 150 (H) 02/12/2018   CHOL 194 02/11/2018   TRIG 287.0 (H) 02/11/2018   HDL 48.70 02/11/2018   LDLDIRECT 85.0 02/11/2018   LDLCALC (H) 01/31/2007    117        Total Cholesterol/HDL:CHD Risk Coronary Heart Disease Risk Table                     Men   Women  1/2 Average Risk   3.4   3.3  Average Risk       5.0   4.4  2 X Average Risk   9.6   7.1  3 X Average Risk  23.4   11.0        Use the calculated Patient Ratio above and the CHD Risk Table to determine the patient's CHD Risk.        ATP III CLASSIFICATION (LDL):  <100     mg/dL   Optimal  100-129  mg/dL   Near or Above  Optimal  130-159  mg/dL   Borderline  160-189  mg/dL   High  >190     mg/dL   Very High   ALT 61 (H) 02/12/2018   AST 73 (H) 02/12/2018   NA 140 02/12/2018   K 4.4 02/12/2018   CL 104 02/12/2018   CREATININE 1.04 (H) 02/12/2018   BUN 19 02/12/2018   CO2 28 02/12/2018   TSH 2.49 02/11/2018   INR 1.0 07/24/2016   HGBA1C 7.2 (H) 02/11/2018    Lab Results  Component Value Date   TSH 2.49 02/11/2018   Lab Results  Component Value Date   WBC 9.2 02/11/2018   HGB 13.0 02/11/2018   HCT 39.0 02/11/2018   MCV 98.6 02/11/2018   PLT 189.0 02/11/2018   Lab Results  Component Value Date   NA 140 02/12/2018   K 4.4 02/12/2018   CHLORIDE 104 11/01/2016   CO2 28 02/12/2018   GLUCOSE 150 (H) 02/12/2018   BUN 19 02/12/2018   CREATININE 1.04 (H) 02/12/2018   BILITOT 1.0 02/12/2018   ALKPHOS 68 02/12/2018   AST 73 (H) 02/12/2018   ALT 61 (H) 02/12/2018   PROT 7.5 02/12/2018   ALBUMIN 3.9 02/12/2018   CALCIUM 10.1 02/12/2018    ANIONGAP 8 02/12/2018   EGFR 50 (L) 11/01/2016   GFR 60.25 02/11/2018   Lab Results  Component Value Date   CHOL 194 02/11/2018   Lab Results  Component Value Date   HDL 48.70 02/11/2018   Lab Results  Component Value Date   LDLCALC (H) 01/31/2007    117        Total Cholesterol/HDL:CHD Risk Coronary Heart Disease Risk Table                     Men   Women  1/2 Average Risk   3.4   3.3  Average Risk       5.0   4.4  2 X Average Risk   9.6   7.1  3 X Average Risk  23.4   11.0        Use the calculated Patient Ratio above and the CHD Risk Table to determine the patient's CHD Risk.        ATP III CLASSIFICATION (LDL):  <100     mg/dL   Optimal  100-129  mg/dL   Near or Above                    Optimal  130-159  mg/dL   Borderline  160-189  mg/dL   High  >190     mg/dL   Very High   Lab Results  Component Value Date   TRIG 287.0 (H) 02/11/2018   Lab Results  Component Value Date   CHOLHDL 4 02/11/2018   Lab Results  Component Value Date   HGBA1C 7.2 (H) 02/11/2018       Assessment & Plan:   Problem List Items Addressed This Visit    Morbid obesity ([place]) (Chronic)    Encouraged DASH diet, decrease po intake and increase exercise as tolerated. Needs 7-8 hours of sleep nightly. Avoid trans fats, eat small, frequent meals every 4-5 hours with lean proteins, complex carbs and healthy fats. Minimize simple carbs      Hyperlipidemia    encouraged heart healthy diet, avoid trans fats, minimize simple carbs and saturated fats. Increase exercise as tolerated      Relevant Orders   Lipid panel (Completed)  TSH (Completed)   Low serum vitamin D    Supplement and monitor      Relevant Orders   VITAMIN D 25 Hydroxy (Vit-D Deficiency, Fractures) (Completed)   Gout    No recent flares. Stay well hydrated      Relevant Orders   Uric acid (Completed)   DM (diabetes mellitus), type 2 (H[facility])    hgba1c acceptable, minimize simple carbs. Increase exercise as  tolerated. Continue current meds      Relevant Orders   Hemoglobin A1c (Completed)   CBC (Completed)   Comprehensive metabolic panel (Completed)   TSH (Completed)   Chronic systolic heart failure (H[facility])    No recent flares      Relevant Orders   TSH (Completed)    Other Visit Diagnoses    Dysuria    -  Primary   Relevant Orders   Urinalysis   Urine Culture (Completed)      I am having Amanda Crawford. Wion maintain her diphenhydramine-acetaminophen, glucosamine-chondroitin, meclizine, omeprazole, famotidine, hyoscyamine, aspirin EC, Fish Oil, colchicine, atorvastatin, nystatin cream, carvedilol, losartan, furosemide, furosemide, Vitamin D (Ergocalciferol), allopurinol, and LORazepam.  No orders of the defined types were placed in this encounter.    Penni Homans, MD

## 2018-02-14 MED ORDER — SULFAMETHOXAZOLE-TRIMETHOPRIM 800-160 MG PO TABS: 1.0000 | ORAL_TABLET | Freq: Two times a day (BID) | ORAL | 0 refills | Status: DC

## 2018-02-14 NOTE — Addendum Note (Signed)
Addended by: Magdalene Molly A on: 02/14/2018 02:56 PM   Modules accepted: Orders

## 2018-02-19 ENCOUNTER — Inpatient Hospital Stay (HOSPITAL_BASED_OUTPATIENT_CLINIC_OR_DEPARTMENT_OTHER): Payer: Medicare HMO | Admitting: Oncology

## 2018-02-19 VITALS — BP 126/59 | HR 80 | Temp 98.5°F | Resp 18 | Ht 64.0 in | Wt 216.1 lb

## 2018-02-19 DIAGNOSIS — K219 Gastro-esophageal reflux disease without esophagitis: Secondary | ICD-10-CM

## 2018-02-19 DIAGNOSIS — Z79899 Other long term (current) drug therapy: Secondary | ICD-10-CM | POA: Diagnosis not present

## 2018-02-19 DIAGNOSIS — C649 Malignant neoplasm of unspecified kidney, except renal pelvis: Secondary | ICD-10-CM

## 2018-02-19 DIAGNOSIS — N289 Disorder of kidney and ureter, unspecified: Secondary | ICD-10-CM | POA: Diagnosis not present

## 2018-02-19 DIAGNOSIS — Z85528 Personal history of other malignant neoplasm of kidney: Secondary | ICD-10-CM

## 2018-02-19 DIAGNOSIS — Z7982 Long term (current) use of aspirin: Secondary | ICD-10-CM

## 2018-02-19 NOTE — Progress Notes (Signed)
Hematology and Oncology Follow Up Visit  JADIA CAPERS [id number] 1945-02-03 73 y.o. 02/19/2018 2:55 PM  CC: Nicanor Alcon, M.D.  Priscille Heidelberg Little, M.D.    Principle Diagnosis: 73 year old woman with renal cell carcinoma diagnosed in 2001.  She has stage IV disease that is resected without any detectable disease at this time.    Prior Therapy:  She is status post left nephrectomy in 2001.  She developed a left lingular mass, status post VATS procedure.  Pathology revealed a recurrent renal cell carcinoma resected in October 2008.   She developed relapse disease in 2014 and subsequently underwent a VATS and left upper lobe wedge resection of a tumor done on 05/09/2012. Pathology confirmed another relapse.  Current therapy: Active surveillance.  Interim History: Mrs. Theilen is here for a follow-up visit.  Since last visit, she reports no major changes in her health.  She continues to be active and attends activities of daily living.  She denies any dyspnea on exertion or excessive fatigue.  She does not report any hematuria or dysuria.  Her performance status and quality of life remains unchanged.  She denies any flank pain or recent hospitalizations.  She does not report any headaches, blurry vision, syncope or seizures.  She denies any dizziness or alteration of mentation.  She does not report any fevers, chills or sweats.  He   She does not report any chest pain, palpitation, orthopnea or leg edema.  She has not reported any nausea, vomiting or change in bowel function.  She has not reported any melena or hematochezia.  She denies any constipation or diarrhea.  She does not report any frequency urgency or hesitancy. She does not report any joint pain or deformity.  She does not report any ecchymosis or petechiae.  Does not report any mood changes. Rest of the review of systems is negative.   Medications: I have reviewed the patient's current medications. Current Outpatient Medications   Medication Sig Dispense Refill  . allopurinol (ZYLOPRIM) 300 MG tablet TAKE 1 TABLET BY MOUTH EVERY DAY 30 tablet 3  . aspirin EC 81 MG tablet Take 81 mg by mouth daily.    Marland Kitchen atorvastatin (LIPITOR) 10 MG tablet Take 1 tablet (10 mg total) by mouth every other day. 45 tablet 1  . carvedilol (COREG) 6.25 MG tablet Take 1 tablet (6.25 mg total) by mouth 2 (two) times daily. KEEP OV. 180 tablet 0  . colchicine 0.6 MG tablet TAKE 2 TABS BY MOUTH ONCE THEN 1 TAB EVERY 2 HOURS TIL RESOLVED OR MAX OF 6 TABS IN 24 HOURS.  2  . diphenhydramine-acetaminophen (TYLENOL PM) 25-500 MG TABS Take 1 tablet by mouth at bedtime as needed. For insomnia    . famotidine (PEPCID) 20 MG tablet Take 20 mg by mouth at bedtime as needed for heartburn or indigestion.     . furosemide (LASIX) 40 MG tablet Take 40 mg by mouth daily as needed.    . furosemide (LASIX) 40 MG tablet TAKE 1 TABLET BY MOUTH EVERY DAY 90 tablet 3  . glucosamine-chondroitin 500-400 MG tablet Take 1 tablet by mouth 2 (two) times a week.     . hyoscyamine (LEVSIN SL) 0.125 MG SL tablet Place 1 tablet (0.125 mg total) under the tongue every 4 (four) hours as needed. (Patient taking differently: Place 0.125 mg under the tongue every 4 (four) hours as needed for cramping. ) 30 tablet 1  . LORazepam (ATIVAN) 1 MG tablet Take 1 tablet (1  mg total) by mouth every 8 (eight) hours. 6 tablet 0  . losartan (COZAAR) 25 MG tablet Take 1 tablet (25 mg total) by mouth daily. 90 tablet 3  . meclizine (ANTIVERT) 12.5 MG tablet Take 1 tablet (12.5 mg total) by mouth 3 (three) times daily as needed for dizziness. As needed for   Dizziness or nausea 30 tablet 1  . nystatin cream (MYCOSTATIN) Apply 1 application topically 2 (two) times daily. 30 g 1  . Omega-3 Fatty Acids (FISH OIL) 1000 MG CAPS Take 1 capsule (1,000 mg total) by mouth 2 (two) times daily.  0  . omeprazole (PRILOSEC OTC) 20 MG tablet Take 20 mg by mouth daily as needed (acid reflux).     Marland Kitchen  sulfamethoxazole-trimethoprim (BACTRIM DS,SEPTRA DS) 800-160 MG tablet Take 1 tablet by mouth 2 (two) times daily. 10 tablet 0  . Vitamin D, Ergocalciferol, (DRISDOL) 1.25 MG (50000 UT) CAPS capsule TAKE 1 CAPSULE (50,000 UNITS TOTAL) BY MOUTH EVERY 7 (SEVEN) DAYS. 4 capsule 4   No current facility-administered medications for this visit.      Allergies: No Known Allergies  Past Medical History, Surgical history, Social history, and Family History remained unchanged on review.   Physical Exam: Blood pressure (!) 126/59, pulse 80, temperature 98.5 F (36.9 C), temperature source Oral, resp. rate 18, height 5\' 4"  (1.626 m), weight 216 lb 1.6 oz (98 kg), SpO2 94 %.     ECOG: 1   General appearance: Alert, awake without any distress. Head: Atraumatic without abnormalities Oropharynx: Without any thrush or ulcers. Eyes: No scleral icterus. Lymph nodes: No lymphadenopathy noted in the cervical, supraclavicular, or axillary nodes Heart:regular rate and rhythm, without any murmurs or gallops.   Lung: Clear to auscultation without any rhonchi, wheezes or dullness to percussion. Abdomin: Soft, nontender without any shifting dullness or ascites. Musculoskeletal: No clubbing or cyanosis. Neurological: No motor or sensory deficits. Skin: No rashes or lesions.    Lab Results: Lab Results  Component Value Date   WBC 9.2 02/11/2018   HGB 13.0 02/11/2018   HCT 39.0 02/11/2018   MCV 98.6 02/11/2018   PLT 189.0 02/11/2018     Chemistry      Component Value Date/Time   NA 140 02/12/2018 1055   NA 139 08/23/2017 0933   NA 140 11/01/2016 0818   K 4.4 02/12/2018 1055   K 4.4 11/01/2016 0818   CL 104 02/12/2018 1055   CL 107 04/16/2012 0923   CO2 28 02/12/2018 1055   CO2 26 11/01/2016 0818   BUN 19 02/12/2018 1055   BUN 24 08/23/2017 0933   BUN 17.9 11/01/2016 0818   CREATININE 1.04 (H) 02/12/2018 1055   CREATININE 1.1 11/01/2016 0818      Component Value Date/Time   CALCIUM  10.1 02/12/2018 1055   CALCIUM 10.3 11/01/2016 0818   ALKPHOS 68 02/12/2018 1055   ALKPHOS 72 11/01/2016 0818   AST 73 (H) 02/12/2018 1055   AST 75 (H) 11/01/2016 0818   ALT 61 (H) 02/12/2018 1055   ALT 75 (H) 11/01/2016 0818   BILITOT 1.0 02/12/2018 1055   BILITOT 0.78 11/01/2016 0818      IMPRESSION: 1. The new small enhancing lesion in the lower pole of the right kidney seen on recent CT is not as well seen on this study, but remains suspicious for a small hypervascular tumor. This is hypoenhancing on the delayed post-contrast images, measuring 11 x 9 mm. Suggest follow-up by multiphasic CT in 6-12 months.  2. Mild upper abdominal adenopathy as seen on recent CT. This is nonspecific and could be related to underlying liver disease, although nodal metastases can not be completely excluded. This can be addressed on follow-up CT as well. 3. No mass in the left nephrectomy bed, adrenal mass or suspicious hepatic findings. Underlying hepatic steatosis.  Impression and plan:  73 year old woman with the following issues:  1.  Left renal cell carcinoma diagnosed in 2001.  She was initially diagnosed with T2N0 disease and subsequently developed stage IV that has been resected.  CT scan of the chest abdomen and pelvis in May 2019 showed no evidence of systemic disease.  The natural course of this disease was reviewed again I recommended continue active surveillance without any need for any systemic therapy.  She will have repeat imaging studies in April 2020.  2.  Right-sided kidney tumor: MRI of the abdomen obtained in January 2020 continues to show small enhancing lesion in the right kidney that appears to be small but could represent malignancy.  The plan is to follow this lesion for the time being with periodic imaging studies every 6 months.  3.  Dyspnea on exertion: Resolved at this time.  4.  Follow-up: We will be in 6 months  15  minutes was spent with the patient  face-to-face today.  More than 50% of time was dedicated to discussing imaging studies as well as disease status update and management options for the future.    Zola Button, MD 1/21/20202:55 PM

## 2018-02-21 ENCOUNTER — Telehealth: Payer: Self-pay

## 2018-02-21 NOTE — Telephone Encounter (Signed)
Per 12/12 no los °

## 2018-03-25 ENCOUNTER — Other Ambulatory Visit: Payer: Self-pay | Admitting: Family Medicine

## 2018-04-07 DIAGNOSIS — R69 Illness, unspecified: Secondary | ICD-10-CM | POA: Diagnosis not present

## 2018-04-17 ENCOUNTER — Other Ambulatory Visit: Payer: Self-pay | Admitting: Family Medicine

## 2018-04-29 DIAGNOSIS — R69 Illness, unspecified: Secondary | ICD-10-CM | POA: Diagnosis not present

## 2018-05-13 ENCOUNTER — Telehealth: Payer: Self-pay | Admitting: Oncology

## 2018-05-13 NOTE — Telephone Encounter (Signed)
Changed apt for 5/1 to a phone visit per 4/9 sch message - pt is aware of appt change

## 2018-05-20 ENCOUNTER — Other Ambulatory Visit: Payer: Self-pay | Admitting: *Deleted

## 2018-05-20 DIAGNOSIS — D4989 Neoplasm of unspecified behavior of other specified sites: Secondary | ICD-10-CM

## 2018-05-24 ENCOUNTER — Other Ambulatory Visit: Payer: Self-pay

## 2018-05-24 ENCOUNTER — Ambulatory Visit (HOSPITAL_COMMUNITY): Admission: RE | Admit: 2018-05-24 | Payer: Medicare HMO | Source: Ambulatory Visit

## 2018-05-24 ENCOUNTER — Ambulatory Visit (HOSPITAL_COMMUNITY)
Admission: RE | Admit: 2018-05-24 | Discharge: 2018-05-24 | Disposition: A | Discharge status: Home / Self Care | Payer: Medicare HMO | Source: Ambulatory Visit | Attending: Oncology | Admitting: Oncology

## 2018-05-24 ENCOUNTER — Encounter (HOSPITAL_COMMUNITY): Payer: Self-pay

## 2018-05-24 ENCOUNTER — Inpatient Hospital Stay: Payer: Medicare HMO | Attending: Oncology

## 2018-05-24 DIAGNOSIS — D4989 Neoplasm of unspecified behavior of other specified sites: Secondary | ICD-10-CM | POA: Diagnosis not present

## 2018-05-24 DIAGNOSIS — C649 Malignant neoplasm of unspecified kidney, except renal pelvis: Secondary | ICD-10-CM | POA: Insufficient documentation

## 2018-05-24 DIAGNOSIS — K7460 Unspecified cirrhosis of liver: Secondary | ICD-10-CM | POA: Insufficient documentation

## 2018-05-24 DIAGNOSIS — N2889 Other specified disorders of kidney and ureter: Secondary | ICD-10-CM | POA: Insufficient documentation

## 2018-05-24 LAB — CBC WITH DIFFERENTIAL (CANCER CENTER ONLY)
Abs Immature Granulocytes: 0.03 10*3/uL (ref 0.00–0.07)
Basophils Absolute: 0 10*3/uL (ref 0.0–0.1)
Basophils Relative: 1 %
Eosinophils Absolute: 0.2 10*3/uL (ref 0.0–0.5)
Eosinophils Relative: 2 %
HCT: 39.6 % (ref 36.0–46.0)
Hemoglobin: 12.8 g/dL (ref 12.0–15.0)
Immature Granulocytes: 0 %
Lymphocytes Relative: 28 %
Lymphs Abs: 2.2 10*3/uL (ref 0.7–4.0)
MCH: 32.1 pg (ref 26.0–34.0)
MCHC: 32.3 g/dL (ref 30.0–36.0)
MCV: 99.2 fL (ref 80.0–100.0)
Monocytes Absolute: 0.5 10*3/uL (ref 0.1–1.0)
Monocytes Relative: 6 %
Neutro Abs: 5 10*3/uL (ref 1.7–7.7)
Neutrophils Relative %: 63 %
Platelet Count: 193 10*3/uL (ref 150–400)
RBC: 3.99 MIL/uL (ref 3.87–5.11)
RDW: 13.7 % (ref 11.5–15.5)
WBC Count: 7.9 10*3/uL (ref 4.0–10.5)
nRBC: 0 % (ref 0.0–0.2)

## 2018-05-24 LAB — CMP (CANCER CENTER ONLY)
ALT: 50 U/L — ABNORMAL HIGH (ref 0–44)
AST: 61 U/L — ABNORMAL HIGH (ref 15–41)
Albumin: 3.7 g/dL (ref 3.5–5.0)
Alkaline Phosphatase: 66 U/L (ref 38–126)
Anion gap: 11 (ref 5–15)
BUN: 17 mg/dL (ref 8–23)
CO2: 23 mmol/L (ref 22–32)
Calcium: 9.7 mg/dL (ref 8.9–10.3)
Chloride: 107 mmol/L (ref 98–111)
Creatinine: 1.1 mg/dL — ABNORMAL HIGH (ref 0.44–1.00)
GFR, Est AFR Am: 57 mL/min — ABNORMAL LOW (ref >60)
GFR, Estimated: 49 mL/min — ABNORMAL LOW (ref >60)
Glucose, Bld: 152 mg/dL — ABNORMAL HIGH (ref 70–99)
Potassium: 4.2 mmol/L (ref 3.5–5.1)
Sodium: 141 mmol/L (ref 135–145)
Total Bilirubin: 0.7 mg/dL (ref 0.3–1.2)
Total Protein: 7.5 g/dL (ref 6.5–8.1)

## 2018-05-24 MED ORDER — SODIUM CHLORIDE (PF) 0.9 % IJ SOLN
INTRAMUSCULAR | Status: AC
  Filled 2018-05-24: qty 50

## 2018-05-24 MED ORDER — IOHEXOL 300 MG/ML  SOLN
100.0000 mL | Freq: Once | INTRAMUSCULAR | Status: AC | PRN
  Administered 2018-05-24: 100 mL via INTRAVENOUS

## 2018-05-31 ENCOUNTER — Inpatient Hospital Stay: Payer: Medicare HMO | Attending: Oncology | Admitting: Oncology

## 2018-05-31 DIAGNOSIS — D4989 Neoplasm of unspecified behavior of other specified sites: Secondary | ICD-10-CM

## 2018-05-31 NOTE — Progress Notes (Signed)
Hematology and Oncology Follow Up for Telemedicine Visits  JOBY HERSHKOWITZ [id number] [birth year] 73 y.o. 05/31/2018 8:49 AM [name], MDBlyth, [name], MD   I connected with Ms.Meckel on 05/31/18 at 10:00 AM EDT by telephone visit and verified that I am speaking with the correct person using two identifiers.   I discussed the limitations, risks, security and privacy concerns of performing an evaluation and management service by telemedicine and the availability of in-person appointments. I also discussed with the patient that there may be a patient responsible charge related to this service. The patient expressed understanding and agreed to proceed.  Other persons participating in the visit and their role in the encounter:    Patient's location: Home Provider's location: Office    Principle Diagnosis: 73 year old woman wit:  1.  Left renal cell carcinoma diagnosed in 2001.    She developed pulmonary metastasis subsequently that was resected and remains without any evidence of recurrent disease.    2.  No right renal neoplasm currently under evaluation.  Prior Therapy:  She is status post left nephrectomy in 2001.  She developed a left lingular mass, status post VATS procedure.  Pathology revealed a recurrent renal cell carcinoma resected in October 2008.   She developed relapse disease in 2014 and subsequently underwent a VATS and left upper lobe wedge resection of a tumor done on 05/09/2012. Pathology confirmed another relapse.  Current therapy: Active surveillance.   Interim History: Mrs. Ridgley      Medications: I have reviewed the patient's current medications.  Current Outpatient Medications  Medication Sig Dispense Refill  . allopurinol (ZYLOPRIM) 300 MG tablet TAKE 1 TABLET BY MOUTH EVERY DAY 30 tablet 3  . aspirin EC 81 MG tablet Take 81 mg by mouth daily.    Marland Kitchen atorvastatin (LIPITOR) 10 MG tablet TAKE 1 TABLET (10 MG TOTAL) BY MOUTH EVERY OTHER DAY. 45 tablet 1   . carvedilol (COREG) 6.25 MG tablet Take 1 tablet (6.25 mg total) by mouth 2 (two) times daily. KEEP OV. 180 tablet 0  . colchicine 0.6 MG tablet TAKE 2 TABS BY MOUTH ONCE THEN 1 TAB EVERY 2 HOURS TIL RESOLVED OR MAX OF 6 TABS IN 24 HOURS.  2  . diphenhydramine-acetaminophen (TYLENOL PM) 25-500 MG TABS Take 1 tablet by mouth at bedtime as needed. For insomnia    . famotidine (PEPCID) 20 MG tablet Take 20 mg by mouth at bedtime as needed for heartburn or indigestion.     . furosemide (LASIX) 40 MG tablet Take 40 mg by mouth daily as needed.    . furosemide (LASIX) 40 MG tablet TAKE 1 TABLET BY MOUTH EVERY DAY 90 tablet 3  . glucosamine-chondroitin 500-400 MG tablet Take 1 tablet by mouth 2 (two) times a week.     . hyoscyamine (LEVSIN SL) 0.125 MG SL tablet Place 1 tablet (0.125 mg total) under the tongue every 4 (four) hours as needed. (Patient taking differently: Place 0.125 mg under the tongue every 4 (four) hours as needed for cramping. ) 30 tablet 1  . LORazepam (ATIVAN) 1 MG tablet Take 1 tablet (1 mg total) by mouth every 8 (eight) hours. 6 tablet 0  . losartan (COZAAR) 25 MG tablet Take 1 tablet (25 mg total) by mouth daily. 90 tablet 3  . meclizine (ANTIVERT) 12.5 MG tablet Take 1 tablet (12.5 mg total) by mouth 3 (three) times daily as needed for dizziness. As needed for   Dizziness or nausea 30 tablet 1  .  nystatin cream (MYCOSTATIN) Apply 1 application topically 2 (two) times daily. 30 g 1  . Omega-3 Fatty Acids (FISH OIL) 1000 MG CAPS Take 1 capsule (1,000 mg total) by mouth 2 (two) times daily.  0  . omeprazole (PRILOSEC OTC) 20 MG tablet Take 20 mg by mouth daily as needed (acid reflux).     Marland Kitchen sulfamethoxazole-trimethoprim (BACTRIM DS,SEPTRA DS) 800-160 MG tablet Take 1 tablet by mouth 2 (two) times daily. 10 tablet 0  . Vitamin D, Ergocalciferol, (DRISDOL) 1.25 MG (50000 UT) CAPS capsule TAKE 1 CAPSULE (50,000 UNITS TOTAL) BY MOUTH EVERY 7 (SEVEN) DAYS. 12 capsule 1   No current  facility-administered medications for this visit.      Allergies: No Known Allergies  Past Medical History, Surgical history, Social history, and Family History were reviewed and updated.      Lab Results: Lab Results  Component Value Date   WBC 7.9 05/24/2018   HGB 12.8 05/24/2018   HCT 39.6 05/24/2018   MCV 99.2 05/24/2018   PLT 193 05/24/2018     Chemistry      Component Value Date/Time   NA 141 05/24/2018 0825   NA 139 08/23/2017 0933   NA 140 11/01/2016 0818   K 4.2 05/24/2018 0825   K 4.4 11/01/2016 0818   CL 107 05/24/2018 0825   CL 107 04/16/2012 0923   CO2 23 05/24/2018 0825   CO2 26 11/01/2016 0818   BUN 17 05/24/2018 0825   BUN 24 08/23/2017 0933   BUN 17.9 11/01/2016 0818   CREATININE 1.10 (H) 05/24/2018 0825   CREATININE 1.1 11/01/2016 0818      Component Value Date/Time   CALCIUM 9.7 05/24/2018 0825   CALCIUM 10.3 11/01/2016 0818   ALKPHOS 66 05/24/2018 0825   ALKPHOS 72 11/01/2016 0818   AST 61 (H) 05/24/2018 0825   AST 75 (H) 11/01/2016 0818   ALT 50 (H) 05/24/2018 0825   ALT 75 (H) 11/01/2016 0818   BILITOT 0.7 05/24/2018 0825   BILITOT 0.78 11/01/2016 0818       Radiological Studies: EXAM: CT ABDOMEN WITHOUT AND WITH CONTRAST  TECHNIQUE: Multidetector CT imaging of the abdomen was performed following the standard protocol before and following the bolus administration of intravenous contrast.  CONTRAST:  135mL OMNIPAQUE IOHEXOL 300 MG/ML  SOLN  COMPARISON:  CT on 11/29/2017  FINDINGS: Lower chest: No acute findings.  Hepatobiliary: No hepatic masses identified. Hypertrophy of caudate and left hepatic lobes again noted, suspicious for cirrhosis. Prior cholecystectomy. No evidence of biliary obstruction.  Pancreas:  No mass or inflammatory changes.  Spleen:  Within normal limits in size and appearance.  Adrenals/Urinary Tract: Prior left nephrectomy again noted. Enhancing mass in lower pole of right kidney currently  measures 1.4 x 1.1 cm on image 68/6, mildly increased in size from 1.2 x 0.9 cm on prior study. This is consistent with small renal cell carcinoma. No evidence of hydronephrosis. No evidence of renal vein or IVC thrombus.  Stomach/Bowel: Visualized portion unremarkable.  Vascular/Lymphatic: Stable shotty lymph nodes in porta hepatis, likely reactive in etiology. No other sites of lymphadenopathy identified. No abdominal aortic aneurysm.  Other:  None.  Musculoskeletal:  No suspicious bone lesions identified.  IMPRESSION: Mild increase in size of 1.4 cm hypervascular right renal mass, consistent with renal cell carcinoma.  No evidence of abdominal metastatic disease.  Hepatic cirrhosis.  Impression and Plan:   73 year old woman with:  73 year old woman with the following issues:  1.  Stage IV left renal cell carcinoma with initial diagnosis in 2001 subsequently developed metastatic disease that was resected and currently without any detectable disease.  Imaging studies obtained April 2020 showed no evidence of disease recurrence in the abdomen pelvis.  Imaging studies of the chest also showed no metastatic disease.  Plan is to continue with active surveillance at this time.  2.    Right kidney tumor: CT scan did show increase in the size currently at 1.4 cm mass.  The differential diagnosis was reviewed today but the growth of this tumor is suspicious for malignancy.  I recommended evaluation by IR and potentially by urology for possible treatment surgically.  Before that, we will present her case at the [facility] tumor board for an opinion.  He is agreeable to proceed with this plan at this time.  3.  Follow-up: We will be in 4 to 6 months depending on the discussion of the GU tumor board.  I discussed the assessment and treatment plan with the patient. The patient was provided an opportunity to ask questions and all were answered. The patient agreed with the plan and  demonstrated an understanding of the instructions.   The patient was advised to call back or seek an in-person evaluation if the symptoms worsen or if the condition fails to improve as anticipated.  I provided 20 minutes of non face-to-face telephone visit time during this encounter, and > 50% was dedicated to reviewing imaging studies laboratory data and discussing future plan of care.  Zola Button, MD 05/31/2018 8:49 AM

## 2018-06-13 ENCOUNTER — Other Ambulatory Visit: Payer: Self-pay

## 2018-06-13 ENCOUNTER — Ambulatory Visit (INDEPENDENT_AMBULATORY_CARE_PROVIDER_SITE_OTHER): Payer: Medicare HMO | Admitting: Family Medicine

## 2018-06-13 DIAGNOSIS — E119 Type 2 diabetes mellitus without complications: Secondary | ICD-10-CM

## 2018-06-13 DIAGNOSIS — R7989 Other specified abnormal findings of blood chemistry: Secondary | ICD-10-CM

## 2018-06-13 DIAGNOSIS — C649 Malignant neoplasm of unspecified kidney, except renal pelvis: Secondary | ICD-10-CM | POA: Diagnosis not present

## 2018-06-13 DIAGNOSIS — E785 Hyperlipidemia, unspecified: Secondary | ICD-10-CM | POA: Diagnosis not present

## 2018-06-13 MED ORDER — ALLOPURINOL 300 MG PO TABS: 300.0000 mg | ORAL_TABLET | Freq: Every day | ORAL | 1 refills | Status: DC

## 2018-06-13 MED ORDER — ATORVASTATIN CALCIUM 10 MG PO TABS: 10.0000 mg | ORAL_TABLET | ORAL | 1 refills | Status: DC

## 2018-06-13 NOTE — Assessment & Plan Note (Signed)
Supplement and monitor 

## 2018-06-16 NOTE — Progress Notes (Signed)
Virtual Visit via Video Note  I connected with Amanda Crawford on 06/16/18 at  9:40 AM EDT by a video enabled telemedicine application and verified that I am speaking with the correct person using two identifiers.  Location: Patient: home Provider: office   I discussed the limitations of evaluation and management by telemedicine and the availability of in person appointments. The patient expressed understanding and agreed to proceed. Magdalene Molly, CMA tried to get patient set up on video visit but was unsuccessful so visit completed with telphone    Subjective:    Patient ID: Amanda Crawford, female    DOB: [birth year], 73 y.o.   MRN: [id number]  No chief complaint on file.   HPI Patient is in today for follow up on chronic medical concerns including renal cell carcinoma, vitamin D deficiency, diabetes and more. She feels well today but a new 1.4 cm lesion has been discovered on her right kidney. She is following with urology and oncology. Sugars ranging from 80 to 180. No polyuria or polydipsia. Denies CP/palp/SOB/HA/congestion/fevers/GI or GU c/o. Taking meds as prescribed  Past Medical History:  Diagnosis Date  . Allergic state 03/12/2015  . Arthritis   . Colon polyp 11/19/2014  . Dermatitis 03/12/2015  . Diarrhea 06/29/2016  . DM (diabetes mellitus), type 2 ([place]) 06/29/2016  . GERD (gastroesophageal reflux disease)    occ  . Gout 03/12/2015  . H/O measles   . H/O mumps   . Headache(784.0)    migraines  . History of chicken pox 11/23/2014  . Hyperglycemia 06/29/2016  . Lung cancer ([place])   . Migraine 11/23/2014  . Obesity 11/23/2014  . Pneumonia    child  . Preventative health care 09/12/2015  . Renal cell cancer ([place])    renal cell ca dx 9/01 and 8/08;  . Shortness of breath    occ  . Skin lesion of breast 03/12/2015    Past Surgical History:  Procedure Laterality Date  . ABDOMINAL HYSTERECTOMY    . APPENDECTOMY    . CARDIAC CATHETERIZATION     yrs ago neg  .  CHOLECYSTECTOMY    . LUNG CANCER SURGERY Left 08  . RENAL MASS EXCISION Left 01  . RIGHT/LEFT HEART CATH AND CORONARY ANGIOGRAPHY N/A 07/27/2016   Procedure: Right/Left Heart Cath and Coronary Angiography;  Surgeon: Leonie Man, MD;  Location: [facility];  Service: Cardiovascular;  Laterality: N/A;  . THORACOTOMY Left 05/09/2012   Procedure: THORACOTOMY MAJOR;  Surgeon: Gaye Pollack, MD;  Location: [facility];  Service: Thoracic;  Laterality: Left;  Marland Kitchen VIDEO BRONCHOSCOPY N/A 05/09/2012   Procedure: VIDEO BRONCHOSCOPY;  Surgeon: Gaye Pollack, MD;  Location: [facility];  Service: Thoracic;  Laterality: N/A;  . WEDGE RESECTION Left 05/09/2012   Procedure: LEFT UPPER LOBE WEDGE RESECTION;  Surgeon: Gaye Pollack, MD;  Location: [facility] OR;  Service: Thoracic;  Laterality: Left;    Family History  Problem Relation Age of Onset  . Heart failure Father   . Asthma Brother   . Asthma Daughter     Social History   Socioeconomic History  . Marital status: Married    Spouse name: Not on file  . Number of children: Not on file  . Years of education: Not on file  . Highest education level: Not on file  Occupational History  . Not on file  Social Needs  . Financial resource strain: Not on file  . Food insecurity:    Worry: Not  on file    Inability: Not on file  . Transportation needs:    Medical: Not on file    Non-medical: Not on file  Tobacco Use  . Smoking status: Former Smoker    Packs/day: 0.50    Years: 35.00    Pack years: 17.50    Types: Cigarettes    Last attempt to quit: 05/08/2006    Years since quitting: 12.1  . Smokeless tobacco: Never Used  Substance and Sexual Activity  . Alcohol use: No  . Drug use: No  . Sexual activity: Not on file    Comment: lives with husband, no dietary restrictions.   Lifestyle  . Physical activity:    Days per week: Not on file    Minutes per session: Not on file  . Stress: Not on file  Relationships  . Social connections:    Talks on  phone: Not on file    Gets together: Not on file    Attends religious service: Not on file    Active member of club or organization: Not on file    Attends meetings of clubs or organizations: Not on file    Relationship status: Not on file  . Intimate partner violence:    Fear of current or ex partner: Not on file    Emotionally abused: Not on file    Physically abused: Not on file    Forced sexual activity: Not on file  Other Topics Concern  . Not on file  Social History Narrative  . Not on file    Outpatient Medications Prior to Visit  Medication Sig Dispense Refill  . aspirin EC 81 MG tablet Take 81 mg by mouth daily.    . carvedilol (COREG) 6.25 MG tablet Take 1 tablet (6.25 mg total) by mouth 2 (two) times daily. KEEP OV. 180 tablet 0  . colchicine 0.6 MG tablet TAKE 2 TABS BY MOUTH ONCE THEN 1 TAB EVERY 2 HOURS TIL RESOLVED OR MAX OF 6 TABS IN 24 HOURS.  2  . diphenhydramine-acetaminophen (TYLENOL PM) 25-500 MG TABS Take 1 tablet by mouth at bedtime as needed. For insomnia    . famotidine (PEPCID) 20 MG tablet Take 20 mg by mouth at bedtime as needed for heartburn or indigestion.     . furosemide (LASIX) 40 MG tablet Take 40 mg by mouth daily as needed.    . furosemide (LASIX) 40 MG tablet TAKE 1 TABLET BY MOUTH EVERY DAY 90 tablet 3  . glucosamine-chondroitin 500-400 MG tablet Take 1 tablet by mouth 2 (two) times a week.     . hyoscyamine (LEVSIN SL) 0.125 MG SL tablet Place 1 tablet (0.125 mg total) under the tongue every 4 (four) hours as needed. (Patient taking differently: Place 0.125 mg under the tongue every 4 (four) hours as needed for cramping. ) 30 tablet 1  . LORazepam (ATIVAN) 1 MG tablet Take 1 tablet (1 mg total) by mouth every 8 (eight) hours. 6 tablet 0  . losartan (COZAAR) 25 MG tablet Take 1 tablet (25 mg total) by mouth daily. 90 tablet 3  . meclizine (ANTIVERT) 12.5 MG tablet Take 1 tablet (12.5 mg total) by mouth 3 (three) times daily as needed for dizziness.  As needed for   Dizziness or nausea 30 tablet 1  . nystatin cream (MYCOSTATIN) Apply 1 application topically 2 (two) times daily. 30 g 1  . Omega-3 Fatty Acids (FISH OIL) 1000 MG CAPS Take 1 capsule (1,000 mg total) by mouth  2 (two) times daily.  0  . omeprazole (PRILOSEC OTC) 20 MG tablet Take 20 mg by mouth daily as needed (acid reflux).     Marland Kitchen sulfamethoxazole-trimethoprim (BACTRIM DS,SEPTRA DS) 800-160 MG tablet Take 1 tablet by mouth 2 (two) times daily. 10 tablet 0  . Vitamin D, Ergocalciferol, (DRISDOL) 1.25 MG (50000 UT) CAPS capsule TAKE 1 CAPSULE (50,000 UNITS TOTAL) BY MOUTH EVERY 7 (SEVEN) DAYS. 12 capsule 1  . allopurinol (ZYLOPRIM) 300 MG tablet TAKE 1 TABLET BY MOUTH EVERY DAY 30 tablet 3  . atorvastatin (LIPITOR) 10 MG tablet TAKE 1 TABLET (10 MG TOTAL) BY MOUTH EVERY OTHER DAY. 45 tablet 1   No facility-administered medications prior to visit.     No Known Allergies  Review of Systems  Constitutional: Negative for fever and malaise/fatigue.  HENT: Negative for congestion.   Eyes: Negative for blurred vision.  Respiratory: Negative for shortness of breath.   Cardiovascular: Negative for chest pain, palpitations and leg swelling.  Gastrointestinal: Negative for abdominal pain, blood in stool and nausea.  Genitourinary: Negative for dysuria and frequency.  Musculoskeletal: Negative for falls.  Skin: Negative for rash.  Neurological: Negative for dizziness, loss of consciousness and headaches.  Endo/Heme/Allergies: Negative for environmental allergies.  Psychiatric/Behavioral: Negative for depression. The patient is not nervous/anxious.        Objective:    Physical Exam Unable to obtain due to telephone visit.  There were no vitals taken for this visit. Wt Readings from Last 3 Encounters:  02/19/18 216 lb 1.6 oz (98 kg)  02/11/18 216 lb 6.4 oz (98.2 kg)  11/29/17 215 lb 6.4 oz (97.7 kg)    Diabetic Foot Exam - Simple   No data filed     Lab Results   Component Value Date   WBC 7.9 05/24/2018   HGB 12.8 05/24/2018   HCT 39.6 05/24/2018   PLT 193 05/24/2018   GLUCOSE 152 (H) 05/24/2018   CHOL 194 02/11/2018   TRIG 287.0 (H) 02/11/2018   HDL 48.70 02/11/2018   LDLDIRECT 85.0 02/11/2018   LDLCALC (H) 01/31/2007    117        Total Cholesterol/HDL:CHD Risk Coronary Heart Disease Risk Table                     Men   Women  1/2 Average Risk   3.4   3.3  Average Risk       5.0   4.4  2 X Average Risk   9.6   7.1  3 X Average Risk  23.4   11.0        Use the calculated Patient Ratio above and the CHD Risk Table to determine the patient's CHD Risk.        ATP III CLASSIFICATION (LDL):  <100     mg/dL   Optimal  100-129  mg/dL   Near or Above                    Optimal  130-159  mg/dL   Borderline  160-189  mg/dL   High  >190     mg/dL   Very High   ALT 50 (H) 05/24/2018   AST 61 (H) 05/24/2018   NA 141 05/24/2018   K 4.2 05/24/2018   CL 107 05/24/2018   CREATININE 1.10 (H) 05/24/2018   BUN 17 05/24/2018   CO2 23 05/24/2018   TSH 2.49 02/11/2018   INR 1.0 07/24/2016   HGBA1C  7.2 (H) 02/11/2018    Lab Results  Component Value Date   TSH 2.49 02/11/2018   Lab Results  Component Value Date   WBC 7.9 05/24/2018   HGB 12.8 05/24/2018   HCT 39.6 05/24/2018   MCV 99.2 05/24/2018   PLT 193 05/24/2018   Lab Results  Component Value Date   NA 141 05/24/2018   K 4.2 05/24/2018   CHLORIDE 104 11/01/2016   CO2 23 05/24/2018   GLUCOSE 152 (H) 05/24/2018   BUN 17 05/24/2018   CREATININE 1.10 (H) 05/24/2018   BILITOT 0.7 05/24/2018   ALKPHOS 66 05/24/2018   AST 61 (H) 05/24/2018   ALT 50 (H) 05/24/2018   PROT 7.5 05/24/2018   ALBUMIN 3.7 05/24/2018   CALCIUM 9.7 05/24/2018   ANIONGAP 11 05/24/2018   EGFR 50 (L) 11/01/2016   GFR 60.25 02/11/2018   Lab Results  Component Value Date   CHOL 194 02/11/2018   Lab Results  Component Value Date   HDL 48.70 02/11/2018   Lab Results  Component Value Date    LDLCALC (H) 01/31/2007    117        Total Cholesterol/HDL:CHD Risk Coronary Heart Disease Risk Table                     Men   Women  1/2 Average Risk   3.4   3.3  Average Risk       5.0   4.4  2 X Average Risk   9.6   7.1  3 X Average Risk  23.4   11.0        Use the calculated Patient Ratio above and the CHD Risk Table to determine the patient's CHD Risk.        ATP III CLASSIFICATION (LDL):  <100     mg/dL   Optimal  100-129  mg/dL   Near or Above                    Optimal  130-159  mg/dL   Borderline  160-189  mg/dL   High  >190     mg/dL   Very High   Lab Results  Component Value Date   TRIG 287.0 (H) 02/11/2018   Lab Results  Component Value Date   CHOLHDL 4 02/11/2018   Lab Results  Component Value Date   HGBA1C 7.2 (H) 02/11/2018       Assessment & Plan:   Problem List Items Addressed This Visit    Morbid obesity ([place]) (Chronic)   Relevant Orders   CBC   Comprehensive metabolic panel   TSH   Cancer of kidney ([place])    S/p nephrectomy Left side, now with new lesion on right being followed by Dr Alen Blew of oncology and urology.       Relevant Medications   allopurinol (ZYLOPRIM) 300 MG tablet   Hyperlipidemia - Primary    Encouraged heart healthy diet, increase exercise, avoid trans fats, consider a krill oil cap daily      Relevant Medications   atorvastatin (LIPITOR) 10 MG tablet   Other Relevant Orders   Lipid panel   Low serum vitamin D    Supplement and monitor      Relevant Orders   VITAMIN D 25 Hydroxy (Vit-D Deficiency, Fractures)   DM (diabetes mellitus), type 2 ([facility])     minimize simple carbs. Increase exercise as tolerated. Continue current meds  Sugars ranging from 80 to 180.  Relevant Medications   atorvastatin (LIPITOR) 10 MG tablet   Other Relevant Orders   Hemoglobin A1c   CBC   Comprehensive metabolic panel   TSH      I have changed Amanda Crawford allopurinol. I am also having her maintain her  diphenhydramine-acetaminophen, glucosamine-chondroitin, meclizine, omeprazole, famotidine, hyoscyamine, aspirin EC, Fish Oil, colchicine, nystatin cream, carvedilol, losartan, furosemide, furosemide, LORazepam, sulfamethoxazole-trimethoprim, Vitamin D (Ergocalciferol), and atorvastatin.  Meds ordered this encounter  Medications  . atorvastatin (LIPITOR) 10 MG tablet    Sig: Take 1 tablet (10 mg total) by mouth every other day.    Dispense:  45 tablet    Refill:  1  . allopurinol (ZYLOPRIM) 300 MG tablet    Sig: Take 1 tablet (300 mg total) by mouth daily.    Dispense:  90 tablet    Refill:  1   I discussed the assessment and treatment plan with the patient. The patient was provided an opportunity to ask questions and all were answered. The patient agreed with the plan and demonstrated an understanding of the instructions.   The patient was advised to call back or seek an in-person evaluation if the symptoms worsen or if the condition fails to improve as anticipated.  I provided 25 minutes of non-face-to-face time during this encounter.   Penni Homans, MD

## 2018-06-16 NOTE — Assessment & Plan Note (Signed)
minimize simple carbs. Increase exercise as tolerated. Continue current meds  Sugars ranging from 80 to 180.

## 2018-06-16 NOTE — Assessment & Plan Note (Signed)
Encouraged heart healthy diet, increase exercise, avoid trans fats, consider a krill oil cap daily 

## 2018-06-16 NOTE — Assessment & Plan Note (Signed)
S/p nephrectomy Left side, now with new lesion on right being followed by Dr Alen Blew of oncology and urology.

## 2018-06-18 ENCOUNTER — Other Ambulatory Visit: Payer: Self-pay

## 2018-06-18 ENCOUNTER — Other Ambulatory Visit (INDEPENDENT_AMBULATORY_CARE_PROVIDER_SITE_OTHER): Payer: Medicare HMO

## 2018-06-18 DIAGNOSIS — E785 Hyperlipidemia, unspecified: Secondary | ICD-10-CM | POA: Diagnosis not present

## 2018-06-18 DIAGNOSIS — R7989 Other specified abnormal findings of blood chemistry: Secondary | ICD-10-CM | POA: Diagnosis not present

## 2018-06-18 DIAGNOSIS — E119 Type 2 diabetes mellitus without complications: Secondary | ICD-10-CM | POA: Diagnosis not present

## 2018-06-18 LAB — CBC
HCT: 40.1 % (ref 36.0–46.0)
Hemoglobin: 13.4 g/dL (ref 12.0–15.0)
MCHC: 33.3 g/dL (ref 30.0–36.0)
MCV: 98.9 fl (ref 78.0–100.0)
Platelets: 187 10*3/uL (ref 150.0–400.0)
RBC: 4.06 Mil/uL (ref 3.87–5.11)
RDW: 15.2 % (ref 11.5–15.5)
WBC: 8.9 10*3/uL (ref 4.0–10.5)

## 2018-06-18 LAB — COMPREHENSIVE METABOLIC PANEL
ALT: 52 U/L — ABNORMAL HIGH (ref 0–35)
AST: 72 U/L — ABNORMAL HIGH (ref 0–37)
Albumin: 4.1 g/dL (ref 3.5–5.2)
Alkaline Phosphatase: 60 U/L (ref 39–117)
BUN: 17 mg/dL (ref 6–23)
CO2: 29 mEq/L (ref 19–32)
Calcium: 9.7 mg/dL (ref 8.4–10.5)
Chloride: 103 mEq/L (ref 96–112)
Creatinine, Ser: 0.9 mg/dL (ref 0.40–1.20)
GFR: 61.02 mL/min (ref >60.00)
Glucose, Bld: 148 mg/dL — ABNORMAL HIGH (ref 70–99)
Potassium: 4.4 mEq/L (ref 3.5–5.1)
Sodium: 139 mEq/L (ref 135–145)
Total Bilirubin: 1 mg/dL (ref 0.2–1.2)
Total Protein: 7.2 g/dL (ref 6.0–8.3)

## 2018-06-18 LAB — LDL CHOLESTEROL, DIRECT: Direct LDL: 76 mg/dL

## 2018-06-18 LAB — LIPID PANEL
Cholesterol: 175 mg/dL (ref 0–200)
HDL: 49.7 mg/dL (ref >39.00)
NonHDL: 124.96
Total CHOL/HDL Ratio: 4
Triglycerides: 254 mg/dL — ABNORMAL HIGH (ref 0.0–149.0)
VLDL: 50.8 mg/dL — ABNORMAL HIGH (ref 0.0–40.0)

## 2018-06-18 LAB — TSH: TSH: 2.64 u[IU]/mL (ref 0.35–4.50)

## 2018-06-18 LAB — VITAMIN D 25 HYDROXY (VIT D DEFICIENCY, FRACTURES): VITD: 45.81 ng/mL (ref 30.00–100.00)

## 2018-06-18 LAB — HEMOGLOBIN A1C: Hgb A1c MFr Bld: 7.9 % — ABNORMAL HIGH (ref 4.6–6.5)

## 2018-06-21 ENCOUNTER — Other Ambulatory Visit: Payer: Self-pay

## 2018-06-21 DIAGNOSIS — E119 Type 2 diabetes mellitus without complications: Secondary | ICD-10-CM

## 2018-06-21 MED ORDER — METFORMIN HCL 500 MG PO TABS: 500.0000 mg | ORAL_TABLET | Freq: Every day | ORAL | 3 refills | Status: DC

## 2018-07-02 ENCOUNTER — Other Ambulatory Visit: Payer: Self-pay | Admitting: Oncology

## 2018-07-02 ENCOUNTER — Telehealth: Payer: Self-pay | Admitting: Family Medicine

## 2018-07-02 DIAGNOSIS — C649 Malignant neoplasm of unspecified kidney, except renal pelvis: Secondary | ICD-10-CM

## 2018-07-02 DIAGNOSIS — D4989 Neoplasm of unspecified behavior of other specified sites: Secondary | ICD-10-CM

## 2018-07-02 NOTE — Telephone Encounter (Unsigned)
Copied from [place] [id number]. Topic: General - Other >> Jul 02, 2018 11:52 AM Celene Kras A wrote: Reason for CRM: Pt called stating she is concerned about medication,metFORMIN (GLUCOPHAGE) 500 MG tablet, because she states it works on her kidney and she only has one kidney. Please contact pt and advise.

## 2018-07-03 NOTE — Telephone Encounter (Signed)
Please advise 

## 2018-07-03 NOTE — Telephone Encounter (Signed)
Yes but it is actually protective of the kidney when sugar is diagnosed. As kidneys age and become less efficent we often stop it later but it is protective initially. I am happy to refer her to endocrinology to discuss options or to do a visit to discuss other options we could switch her to. Just see what she wants to do.

## 2018-07-05 NOTE — Telephone Encounter (Signed)
Left message for patient to call the office back   Nurse triage may handle

## 2018-07-08 NOTE — Telephone Encounter (Signed)
Can you call patient and add them to Dr. Charlett Blake next available virtual visit

## 2018-07-11 ENCOUNTER — Telehealth: Payer: Self-pay

## 2018-07-11 ENCOUNTER — Other Ambulatory Visit: Payer: Self-pay | Admitting: Oncology

## 2018-07-11 DIAGNOSIS — D4989 Neoplasm of unspecified behavior of other specified sites: Secondary | ICD-10-CM

## 2018-07-11 NOTE — Telephone Encounter (Signed)
Called patient and let her know that Dr. Alen Blew has no objections to her taking Metformin. Also informed patient that Dr. Alen Blew said he referred her to IR back in May and he will refer again and IR will discuss a procedure to treat her kidney tumor. Patient verbalized understanding.

## 2018-07-11 NOTE — Telephone Encounter (Signed)
-----   Message from Wyatt Portela, MD sent at 07/11/2018 11:08 AM EDT ----- Contact: [phone] No objections to metformin. She was referred to IR back then and I will refer again. IR will discuss a procedure to treat her kidney tumor.  Thanks.  ----- Message ----- From: Tami Lin, RN Sent: 07/11/2018  10:59 AM EDT To: Wyatt Portela, MD  Patient called and stated she has not heard anything since her 5/1 visit. She said her case was going to be discussed at tumor board and she thought she would receive a phone call the next week but has not received an update about the plan or options. Patient also wants to know your opinion on her PCP starting her on Metformin. Lanelle Bal

## 2018-07-16 ENCOUNTER — Telehealth: Payer: Medicare HMO

## 2018-07-17 ENCOUNTER — Encounter: Payer: Self-pay | Admitting: *Deleted

## 2018-07-17 ENCOUNTER — Ambulatory Visit
Admission: RE | Admit: 2018-07-17 | Discharge: 2018-07-17 | Disposition: A | Discharge status: Home / Self Care | Payer: Medicare HMO | Source: Ambulatory Visit | Attending: Oncology | Admitting: Oncology

## 2018-07-17 ENCOUNTER — Other Ambulatory Visit: Payer: Self-pay | Admitting: Interventional Radiology

## 2018-07-17 ENCOUNTER — Other Ambulatory Visit: Payer: Self-pay

## 2018-07-17 DIAGNOSIS — Z85528 Personal history of other malignant neoplasm of kidney: Secondary | ICD-10-CM | POA: Diagnosis not present

## 2018-07-17 DIAGNOSIS — D4989 Neoplasm of unspecified behavior of other specified sites: Secondary | ICD-10-CM

## 2018-07-17 DIAGNOSIS — C641 Malignant neoplasm of right kidney, except renal pelvis: Secondary | ICD-10-CM

## 2018-07-17 DIAGNOSIS — M8448XA Pathological fracture, other site, initial encounter for fracture: Secondary | ICD-10-CM | POA: Diagnosis not present

## 2018-07-17 DIAGNOSIS — Z905 Acquired absence of kidney: Secondary | ICD-10-CM | POA: Diagnosis not present

## 2018-07-17 HISTORY — PX: IR RADIOLOGIST EVAL & MGMT: IMG5224

## 2018-07-17 NOTE — Consult Note (Addendum)
Chief Complaint: Patient was consulted remotely today (TeleHealth) for right renal mass at the request of Shadad,Firas N.    Referring Physician(s): Shadad,Firas N  History of Present Illness: Amanda Crawford is a 73 y.o. female with a history of left nephrectomy for renal cell carcinoma in 2001 by [name], with concurrent cholecystectomy by [name].  Solitary lung metastases x2 were resected from the left lung by Drs.[name] and Badin, latest 2014.  On subsequent surveillance imaging, a new right lower pole enhancing renal mass was identified concerning for renal cell carcinoma.  No regional adenopathy.  No evidence of renal vein involvement.  She tolerated general anesthetic well in the past.  She is not taking any anticoagulation.  Renal function currently normal. Past Medical History:  Diagnosis Date   Allergic state 03/12/2015   Arthritis    Colon polyp 11/19/2014   Dermatitis 03/12/2015   Diarrhea 06/29/2016   DM (diabetes mellitus), type 2 ([place]) 06/29/2016   GERD (gastroesophageal reflux disease)    occ   Gout 03/12/2015   H/O measles    H/O mumps    Headache(784.0)    migraines   History of chicken pox 11/23/2014   Hyperglycemia 06/29/2016   Lung cancer ([place])    Migraine 11/23/2014   Obesity 11/23/2014   Pneumonia    child   Preventative health care 09/12/2015   Renal cell cancer ([place])    renal cell ca dx 9/01 and 8/08;   Shortness of breath    occ   Skin lesion of breast 03/12/2015    Past Surgical History:  Procedure Laterality Date   ABDOMINAL HYSTERECTOMY     APPENDECTOMY     CARDIAC CATHETERIZATION     yrs ago neg   CHOLECYSTECTOMY     LUNG CANCER SURGERY Left 08   RENAL MASS EXCISION Left 01   RIGHT/LEFT HEART CATH AND CORONARY ANGIOGRAPHY N/A 07/27/2016   Procedure: Right/Left Heart Cath and Coronary Angiography;  Surgeon: Leonie Man, MD;  Location: [facility];  Service: Cardiovascular;  Laterality: N/A;    THORACOTOMY Left 05/09/2012   Procedure: THORACOTOMY MAJOR;  Surgeon: Gaye Pollack, MD;  Location: [facility];  Service: Thoracic;  Laterality: Left;   VIDEO BRONCHOSCOPY N/A 05/09/2012   Procedure: VIDEO BRONCHOSCOPY;  Surgeon: Gaye Pollack, MD;  Location: [facility];  Service: Thoracic;  Laterality: N/A;   WEDGE RESECTION Left 05/09/2012   Procedure: LEFT UPPER LOBE WEDGE RESECTION;  Surgeon: Gaye Pollack, MD;  Location: [facility];  Service: Thoracic;  Laterality: Left;    Allergies: Patient has no known allergies.  Medications: Prior to Admission medications   Medication Sig Start Date End Date Taking? Authorizing Provider  allopurinol (ZYLOPRIM) 300 MG tablet Take 1 tablet (300 mg total) by mouth daily. 06/13/18   [name], MD  aspirin EC 81 MG tablet Take 81 mg by mouth daily.    [provider]  atorvastatin (LIPITOR) 10 MG tablet Take 1 tablet (10 mg total) by mouth every other day. 06/13/18   [name], MD  carvedilol (COREG) 6.25 MG tablet Take 1 tablet (6.25 mg total) by mouth 2 (two) times daily. KEEP OV. 07/09/17   Almyra Deforest, PA  colchicine 0.6 MG tablet TAKE 2 TABS BY MOUTH ONCE THEN 1 TAB EVERY 2 HOURS TIL RESOLVED OR MAX OF 6 TABS IN 24 HOURS. 05/02/17   [provider]  diphenhydramine-acetaminophen (TYLENOL PM) 25-500 MG TABS  Take 1 tablet by mouth at bedtime as needed. For insomnia    [provider]  famotidine (PEPCID) 20 MG tablet Take 20 mg by mouth at bedtime as needed for heartburn or indigestion.     [provider]  furosemide (LASIX) 40 MG tablet Take 40 mg by mouth daily as needed.    [provider]  furosemide (LASIX) 40 MG tablet TAKE 1 TABLET BY MOUTH EVERY DAY 10/12/17   Hilty, Nadean Corwin, MD  glucosamine-chondroitin 500-400 MG tablet Take 1 tablet by mouth 2 (two) times a week.     [provider]  hyoscyamine (LEVSIN SL) 0.125 MG SL tablet Place 1 tablet (0.125 mg total) under the tongue every 4 (four)  hours as needed. Patient taking differently: Place 0.125 mg under the tongue every 4 (four) hours as needed for cramping.  03/02/16   [name], MD  LORazepam (ATIVAN) 1 MG tablet Take 1 tablet (1 mg total) by mouth every 8 (eight) hours. 01/29/18   Wyatt Portela, MD  losartan (COZAAR) 25 MG tablet Take 1 tablet (25 mg total) by mouth daily. 07/30/17   Hilty, Nadean Corwin, MD  meclizine (ANTIVERT) 12.5 MG tablet Take 1 tablet (12.5 mg total) by mouth 3 (three) times daily as needed for dizziness. As needed for   Dizziness or nausea 11/23/14   [name], MD  metFORMIN (GLUCOPHAGE) 500 MG tablet Take 1 tablet (500 mg total) by mouth daily. 06/21/18   [name], MD  nystatin cream (MYCOSTATIN) Apply 1 application topically 2 (two) times daily. 06/07/17   [name], MD  Omega-3 Fatty Acids (FISH OIL) 1000 MG CAPS Take 1 capsule (1,000 mg total) by mouth 2 (two) times daily. 08/16/16   Almyra Deforest, PA  omeprazole (PRILOSEC OTC) 20 MG tablet Take 20 mg by mouth daily as needed (acid reflux).     [provider]  sulfamethoxazole-trimethoprim (BACTRIM DS,SEPTRA DS) 800-160 MG tablet Take 1 tablet by mouth 2 (two) times daily. 02/14/18   [name], MD  Vitamin D, Ergocalciferol, (DRISDOL) 1.25 MG (50000 UT) CAPS capsule TAKE 1 CAPSULE (50,000 UNITS TOTAL) BY MOUTH EVERY 7 (SEVEN) DAYS. 04/17/18   Ann Held, DO     Family History  Problem Relation Age of Onset   Heart failure Father    Asthma Brother    Asthma Daughter     Social History   Socioeconomic History   Marital status: Married    Spouse name: Not on file   Number of children: Not on file   Years of education: Not on file   Highest education level: Not on file  Occupational History   Not on file  Social Needs   Financial resource strain: Not on file   Food insecurity    Worry: Not on file    Inability: Not on file   Transportation needs    Medical: Not on file    Non-medical: Not  on file  Tobacco Use   Smoking status: Former Smoker    Packs/day: 0.50    Years: 35.00    Pack years: 17.50    Types: Cigarettes    Quit date: 05/08/2006    Years since quitting: 12.2   Smokeless tobacco: Never Used  Substance and Sexual Activity   Alcohol use: No   Drug use: No   Sexual activity: Not on file    Comment: lives with husband, no dietary restrictions.   Lifestyle  Physical activity    Days per week: Not on file    Minutes per session: Not on file   Stress: Not on file  Relationships   Social connections    Talks on phone: Not on file    Gets together: Not on file    Attends religious service: Not on file    Active member of club or organization: Not on file    Attends meetings of clubs or organizations: Not on file    Relationship status: Not on file  Other Topics Concern   Not on file  Social History Narrative   Not on file    ECOG Status: 1 - Symptomatic but completely ambulatory   Physical Exam No direct physical exam was performed (except for noted visual exam findings with Video Visits).    Review of Systems  Review of Systems: A 12 point ROS discussed and pertinent positives are indicated in the HPI above.  All other systems are negative. Vital Signs: There were no vitals taken for this visit.  Imaging: CT ABDOMEN WITHOUT AND WITH CONTRAST  TECHNIQUE: Multidetector CT imaging of the abdomen was performed following the standard protocol before and following the bolus administration of intravenous contrast.  CONTRAST: 144mL OMNIPAQUE IOHEXOL 300 MG/ML SOLN  COMPARISON: CT on 11/29/2017  FINDINGS: Lower chest: No acute findings.  Hepatobiliary: No hepatic masses identified. Hypertrophy of caudate and left hepatic lobes again noted, suspicious for cirrhosis. Prior cholecystectomy. No evidence of biliary obstruction.  Pancreas: No mass or inflammatory changes.  Spleen: Within normal limits in size and  appearance.  Adrenals/Urinary Tract: Prior left nephrectomy again noted. Enhancing mass in lower pole of right kidney currently measures 1.4 x 1.1 cm on image 68/6, mildly increased in size from 1.2 x 0.9 cm on prior study. This is consistent with small renal cell carcinoma. No evidence of hydronephrosis. No evidence of renal vein or IVC thrombus.  Stomach/Bowel: Visualized portion unremarkable.  Vascular/Lymphatic: Stable shotty lymph nodes in porta hepatis, likely reactive in etiology. No other sites of lymphadenopathy identified. No abdominal aortic aneurysm.  Other: None.  Musculoskeletal: No suspicious bone lesions identified.  IMPRESSION: Mild increase in size of 1.4 cm hypervascular right renal mass, consistent with renal cell carcinoma.  No evidence of abdominal metastatic disease.  Hepatic cirrhosis.   Electronically Signed By: Earle Gell M.D. On: 05/24/2018 12:45   Note: On my review, lesion measures up to 2 cm on the reconstructed images    MRI ABDOMEN WITHOUT AND WITH CONTRAST  TECHNIQUE: Multiplanar multisequence MR imaging of the abdomen was performed both before and after the administration of intravenous contrast.  CONTRAST: 9 cc Gadavist  COMPARISON: Abdominopelvic CT 06/14/2017 and 11/29/2017.  FINDINGS: Lower chest: Stable chronic asymmetric elevation of the right hemidiaphragm. The visualized lower chest otherwise appears unremarkable.  Hepatobiliary: Mild hepatic steatosis with loss of signal on the gradient echo opposed phase images. The hepatic enhancement is mildly heterogeneous on the immediate postcontrast images. No focal lesions are identified. Stable mild contour irregularity of the liver and relative enlargement of the caudate lobe. No significant biliary dilatation post cholecystectomy.  Pancreas: Unremarkable. No pancreatic ductal dilatation or surrounding inflammatory changes.  Spleen: Normal in size without focal  abnormality.  Adrenals/Urinary Tract: No evidence of adrenal mass. The left adrenal gland is partly obscured by artifact from an adjacent surgical clip seen on CT. There is no mass in the left nephrectomy bed. The hypervascular component of the small lesion in the lower pole of the  right kidney noted on CT is not well seen by this examination. On the delayed post-contrast images, there is a hypoenhancing lesion measuring 11 x 9 mm on image 68/905. This corresponds with the lesion seen on CT. No hydronephrosis.  Stomach/Bowel: No evidence of bowel wall thickening, distention or surrounding inflammatory change.  Vascular/Lymphatic: As seen on recent CT, there are enlarging lymph nodes within the porta hepatis and around the celiac trunk, including a 12 mm node superior to the pancreas on image 34/901 and a 14 mm portacaval node on image 40/901. The right renal vein and IVC appear normal. Aortic atherosclerosis, better seen on CT.  Other: Intact visualized anterior abdominal wall. No ascites.  Musculoskeletal: No acute or significant osseous findings. Probable T11 hemangioma. Mild degenerative changes throughout the spine.  IMPRESSION: 1. The new small enhancing lesion in the lower pole of the right kidney seen on recent CT is not as well seen on this study, but remains suspicious for a small hypervascular tumor. This is hypoenhancing on the delayed post-contrast images, measuring 11 x 9 mm. Suggest follow-up by multiphasic CT in 6-12 months. 2. Mild upper abdominal adenopathy as seen on recent CT. This is nonspecific and could be related to underlying liver disease, although nodal metastases can not be completely excluded. This can be addressed on follow-up CT as well. 3. No mass in the left nephrectomy bed, adrenal mass or suspicious hepatic findings. Underlying hepatic steatosis.   Electronically Signed By: Richardean Sale M.D. On: 02/12/2018 14:35   Labs:  CBC: Recent  Labs    11/29/17 1226 02/11/18 1022 05/24/18 0825 06/18/18 1033  WBC 10.0 9.2 7.9 8.9  HGB 13.4 13.0 12.8 13.4  HCT 41.5 39.0 39.6 40.1  PLT 239 189.0 193 187.0    COAGS: No results for input(s): INR, APTT in the last 8760 hours.  BMP: Recent Labs    08/23/17 0933  11/29/17 1226 02/11/18 1022 02/12/18 1055 05/24/18 0825 06/18/18 1033  NA 139   < > 140 138 140 141 139  K 4.6   < > 4.3 4.6 4.4 4.2 4.4  CL 99   < > 102 103 104 107 103  CO2 21   < > 29 27 28 23 29   GLUCOSE 143*   < > 122* 131* 150* 152* 148*  BUN 24   < > 22 21 19 17 17   CALCIUM 9.9   < > 10.5* 10.5 10.1 9.7 9.7  CREATININE 1.21*   < > 1.29* 0.96 1.04* 1.10* 0.90  GFRNONAA 44*  --  40*  --  53* 49*  --   GFRAA 51*  --  46*  --  >60 57*  --    < > = values in this interval not displayed.    LIVER FUNCTION TESTS: Recent Labs    02/11/18 1022 02/12/18 1055 05/24/18 0825 06/18/18 1033  BILITOT 0.7 1.0 0.7 1.0  AST 54* 73* 61* 72*  ALT 48* 61* 50* 52*  ALKPHOS 63 68 66 60  PROT 7.1 7.5 7.5 7.2  ALBUMIN 4.1 3.9 3.7 4.1    TUMOR MARKERS: No results for input(s): AFPTM, CEA, CA199, CHROMGRNA in the last 8760 hours.  Assessment and Plan:  My impression is that this patient has a probable renal cell carcinoma in the lower pole of the right kidney.  Nephrectomy is contraindicated due to solitary kidney secondary to previous left nephrectomy.  Its location may be approachable for partial nephrectomy, but this would at minimum resulting in  significant nephron loss.  The lesion is quite approachable for percutaneous ablation treatment.  The lower pole location of the lesion is relatively remote from the renal hilum and renal collecting system.  She has a good distribution of retroperitoneal fat without any encroachment of other organs near the lesion, such that hydrodissection would probably not even be needed.  Clinically, she is a good candidate given her high functional status.  I reviewed with the patient  the pathophysiology of renal cell carcinoma, and the low likelihood of significant metastatic disease at this very small size.  We discussed treatment options including watchful waiting, surgical resection, and percutaneous thermal ablation.  We discussed in detail the cryoablation procedure, anticipated benefits, possible risks and complications, and time course of symptom resolution.  We discussed this would be performed with curative intent.  We discussed the need for continued follow-up surveillance imaging to rule out any residual/recurrent tumor. We would attempt percutaneous core biopsy concurrently with ablation to confirm the pathologic diagnosis.  She seemed to understand, had her questions answered, and seemed eager to proceed with treatment.  Accordingly, we can set up CT-guided percutaneous cryoablation of the right lower pole renal lesion under anesthesia with overnight observation, at her convenience.  Thank you for this interesting consult.  I greatly enjoyed meeting MANDEE PLUTA and look forward to participating in their care.  A copy of this report was sent to the requesting provider on this date.  Electronically Signed: Rickard Rhymes 07/17/2018, 2:46 PM   I spent a total of  30 Minutes   in remote  clinical consultation, greater than 50% of which was counseling/coordinating care for right lower pole renal mass.    Visit type: Audio only (telephone). Audio (no video) only due to patient's lack of internet/smartphone capability. Alternative for in-person consultation at [facility], [address] [place], [place], [place]. This visit type was conducted due to national recommendations for restrictions regarding the COVID-19 Pandemic (e.g. social distancing).  This format is felt to be most appropriate for this patient at this time.  All issues noted in this document were discussed and addressed.

## 2018-07-18 DIAGNOSIS — H25013 Cortical age-related cataract, bilateral: Secondary | ICD-10-CM | POA: Diagnosis not present

## 2018-07-18 DIAGNOSIS — H40013 Open angle with borderline findings, low risk, bilateral: Secondary | ICD-10-CM | POA: Diagnosis not present

## 2018-07-18 DIAGNOSIS — H2513 Age-related nuclear cataract, bilateral: Secondary | ICD-10-CM | POA: Diagnosis not present

## 2018-07-18 DIAGNOSIS — H35373 Puckering of macula, bilateral: Secondary | ICD-10-CM | POA: Diagnosis not present

## 2018-07-18 LAB — HM DIABETES EYE EXAM

## 2018-07-22 ENCOUNTER — Other Ambulatory Visit: Payer: Self-pay

## 2018-07-22 ENCOUNTER — Ambulatory Visit (INDEPENDENT_AMBULATORY_CARE_PROVIDER_SITE_OTHER): Payer: Medicare HMO | Admitting: Family Medicine

## 2018-07-22 ENCOUNTER — Encounter: Payer: Self-pay | Admitting: Family Medicine

## 2018-07-22 DIAGNOSIS — E785 Hyperlipidemia, unspecified: Secondary | ICD-10-CM

## 2018-07-22 DIAGNOSIS — E119 Type 2 diabetes mellitus without complications: Secondary | ICD-10-CM | POA: Diagnosis not present

## 2018-07-22 DIAGNOSIS — R7989 Other specified abnormal findings of blood chemistry: Secondary | ICD-10-CM | POA: Diagnosis not present

## 2018-07-22 DIAGNOSIS — C649 Malignant neoplasm of unspecified kidney, except renal pelvis: Secondary | ICD-10-CM | POA: Diagnosis not present

## 2018-07-22 NOTE — Assessment & Plan Note (Signed)
Supplement and monitor 

## 2018-07-22 NOTE — Assessment & Plan Note (Signed)
Doing very well

## 2018-07-22 NOTE — Assessment & Plan Note (Signed)
Encouraged heart healthy diet, increase exercise, avoid trans fats, consider a krill oil cap daily 

## 2018-07-22 NOTE — Progress Notes (Signed)
Virtual Visit via Telephone Note  I connected with Amanda Crawford on 07/23/18 at  8:40 AM EDT by a telephone enabled telemedicine application and verified that I am speaking with the correct person using two identifiers.   Location: Patient: home Provider: office   I discussed the limitations of evaluation and management by telemedicine and the availability of in person appointments. The patient expressed understanding and agreed to proceed. Amanda Crawford, CMA was unable to get patient set up on video platform so then we completed visit on telephone     Subjective:    Patient ID: Amanda Crawford, female    DOB: 12-19-1945, 73 y.o.   MRN: [id number]  No chief complaint on file.   HPI Patient is in today for follow up on renal cell carcinoma, diabetes, hypertension, and more. She has been tolerating Metformin. Her blood sugars have been running around 140s. No polyuria, polydipsia. Her oncologist, Dr Alen Blew has referred her to IR, Dr Marijo File and they are going to do a cryotherapy procedure on her RCC recurrence. Not sure when given the COVID situation. Denies CP/palp/SOB/HA/congestion/fevers/GI or GU c/o. Taking meds as prescribed  Past Medical History:  Diagnosis Date  . Allergic state 03/12/2015  . Arthritis   . Colon polyp 11/19/2014  . Dermatitis 03/12/2015  . Diarrhea 06/29/2016  . DM (diabetes mellitus), type 2 ([place]) 06/29/2016  . GERD (gastroesophageal reflux disease)    occ  . Gout 03/12/2015  . H/O measles   . H/O mumps   . Headache(784.0)    migraines  . History of chicken pox 11/23/2014  . Hyperglycemia 06/29/2016  . Lung cancer ([place])   . Migraine 11/23/2014  . Obesity 11/23/2014  . Pneumonia    child  . Preventative health care 09/12/2015  . Renal cell cancer ([place])    renal cell ca dx 9/01 and 8/08;  . Shortness of breath    occ  . Skin lesion of breast 03/12/2015    Past Surgical History:  Procedure Laterality Date  . ABDOMINAL HYSTERECTOMY    .  APPENDECTOMY    . CARDIAC CATHETERIZATION     yrs ago neg  . CHOLECYSTECTOMY    . IR RADIOLOGIST EVAL & MGMT  07/17/2018  . LUNG CANCER SURGERY Left 08  . RENAL MASS EXCISION Left 01  . RIGHT/LEFT HEART CATH AND CORONARY ANGIOGRAPHY N/A 07/27/2016   Procedure: Right/Left Heart Cath and Coronary Angiography;  Surgeon: Leonie Man, MD;  Location: [facility];  Service: Cardiovascular;  Laterality: N/A;  . THORACOTOMY Left 05/09/2012   Procedure: THORACOTOMY MAJOR;  Surgeon: Gaye Pollack, MD;  Location: [facility];  Service: Thoracic;  Laterality: Left;  Marland Kitchen VIDEO BRONCHOSCOPY N/A 05/09/2012   Procedure: VIDEO BRONCHOSCOPY;  Surgeon: Gaye Pollack, MD;  Location[facility];  Service: Thoracic;  Laterality: N/A;  . WEDGE RESECTION Left 05/09/2012   Procedure: LEFT UPPER LOBE WEDGE RESECTION;  Surgeon: Gaye Pollack, MD;  Location: [facility];  Service: Thoracic;  Laterality: Left;    Family History  Problem Relation Age of Onset  . Heart failure Father   . Diabetes Father   . Hyperlipidemia Father   . Heart disease Father   . Hypertension Father   . Asthma Brother   . Asthma Daughter   . Cancer Paternal Grandmother   . Hearing loss Paternal Grandfather   . Stroke Paternal Grandfather   . Alcohol abuse Maternal Aunt     Social History   Socioeconomic  History  . Marital status: Married    Spouse name: Not on file  . Number of children: Not on file  . Years of education: Not on file  . Highest education level: Not on file  Occupational History  . Not on file  Social Needs  . Financial resource strain: Not on file  . Food insecurity    Worry: Not on file    Inability: Not on file  . Transportation needs    Medical: Not on file    Non-medical: Not on file  Tobacco Use  . Smoking status: Former Smoker    Packs/day: 0.50    Years: 35.00    Pack years: 17.50    Types: Cigarettes    Quit date: 05/08/2006    Years since quitting: 12.2  . Smokeless tobacco: Never Used  Substance  and Sexual Activity  . Alcohol use: No  . Drug use: No  . Sexual activity: Not on file    Comment: lives with husband, no dietary restrictions.   Lifestyle  . Physical activity    Days per week: Not on file    Minutes per session: Not on file  . Stress: Not on file  Relationships  . Social Herbalist on phone: Not on file    Gets together: Not on file    Attends religious service: Not on file    Active member of club or organization: Not on file    Attends meetings of clubs or organizations: Not on file    Relationship status: Not on file  . Intimate partner violence    Fear of current or ex partner: Not on file    Emotionally abused: Not on file    Physically abused: Not on file    Forced sexual activity: Not on file  Other Topics Concern  . Not on file  Social History Narrative  . Not on file    Outpatient Medications Prior to Visit  Medication Sig Dispense Refill  . allopurinol (ZYLOPRIM) 300 MG tablet Take 1 tablet (300 mg total) by mouth daily. 90 tablet 1  . aspirin EC 81 MG tablet Take 81 mg by mouth daily.    Marland Kitchen atorvastatin (LIPITOR) 10 MG tablet Take 1 tablet (10 mg total) by mouth every other day. 45 tablet 1  . carvedilol (COREG) 6.25 MG tablet Take 1 tablet (6.25 mg total) by mouth 2 (two) times daily. KEEP OV. 180 tablet 0  . colchicine 0.6 MG tablet TAKE 2 TABS BY MOUTH ONCE THEN 1 TAB EVERY 2 HOURS TIL RESOLVED OR MAX OF 6 TABS IN 24 HOURS.  2  . diphenhydramine-acetaminophen (TYLENOL PM) 25-500 MG TABS Take 1 tablet by mouth at bedtime as needed. For insomnia    . famotidine (PEPCID) 20 MG tablet Take 20 mg by mouth at bedtime as needed for heartburn or indigestion.     . furosemide (LASIX) 40 MG tablet Take 40 mg by mouth daily as needed.    . furosemide (LASIX) 40 MG tablet TAKE 1 TABLET BY MOUTH EVERY DAY 90 tablet 3  . glucosamine-chondroitin 500-400 MG tablet Take 1 tablet by mouth 2 (two) times a week.     . hyoscyamine (LEVSIN SL) 0.125 MG SL  tablet Place 1 tablet (0.125 mg total) under the tongue every 4 (four) hours as needed. (Patient taking differently: Place 0.125 mg under the tongue every 4 (four) hours as needed for cramping. ) 30 tablet 1  . LORazepam (ATIVAN) 1  MG tablet Take 1 tablet (1 mg total) by mouth every 8 (eight) hours. 6 tablet 0  . losartan (COZAAR) 25 MG tablet Take 1 tablet (25 mg total) by mouth daily. 90 tablet 3  . meclizine (ANTIVERT) 12.5 MG tablet Take 1 tablet (12.5 mg total) by mouth 3 (three) times daily as needed for dizziness. As needed for   Dizziness or nausea 30 tablet 1  . metFORMIN (GLUCOPHAGE) 500 MG tablet Take 1 tablet (500 mg total) by mouth daily. 30 tablet 3  . nystatin cream (MYCOSTATIN) Apply 1 application topically 2 (two) times daily. 30 g 1  . Omega-3 Fatty Acids (FISH OIL) 1000 MG CAPS Take 1 capsule (1,000 mg total) by mouth 2 (two) times daily.  0  . omeprazole (PRILOSEC OTC) 20 MG tablet Take 20 mg by mouth daily as needed (acid reflux).     . Vitamin D, Ergocalciferol, (DRISDOL) 1.25 MG (50000 UT) CAPS capsule TAKE 1 CAPSULE (50,000 UNITS TOTAL) BY MOUTH EVERY 7 (SEVEN) DAYS. 12 capsule 1  . sulfamethoxazole-trimethoprim (BACTRIM DS,SEPTRA DS) 800-160 MG tablet Take 1 tablet by mouth 2 (two) times daily. 10 tablet 0   No facility-administered medications prior to visit.     No Known Allergies  Review of Systems  Constitutional: Negative for fever and malaise/fatigue.  HENT: Negative for congestion.   Eyes: Negative for blurred vision.  Respiratory: Negative for shortness of breath.   Cardiovascular: Negative for chest pain, palpitations and leg swelling.  Gastrointestinal: Negative for abdominal pain, blood in stool and nausea.  Genitourinary: Negative for dysuria and frequency.  Musculoskeletal: Negative for falls.  Skin: Negative for rash.  Neurological: Negative for dizziness, loss of consciousness and headaches.  Endo/Heme/Allergies: Negative for environmental allergies.   Psychiatric/Behavioral: Negative for depression. The patient is not nervous/anxious.        Objective:    Physical Exam unable to obtain via phone  BP 130/74 (BP Location: Left Arm, Patient Position: Sitting, Cuff Size: Normal)   Pulse 76   Wt 216 lb (98 kg)   SpO2 92%   BMI 37.08 kg/m  Wt Readings from Last 3 Encounters:  07/22/18 216 lb (98 kg)  02/19/18 216 lb 1.6 oz (98 kg)  02/11/18 216 lb 6.4 oz (98.2 kg)    Diabetic Foot Exam - Simple   No data filed     Lab Results  Component Value Date   WBC 8.9 06/18/2018   HGB 13.4 06/18/2018   HCT 40.1 06/18/2018   PLT 187.0 06/18/2018   GLUCOSE 148 (H) 06/18/2018   CHOL 175 06/18/2018   TRIG 254.0 (H) 06/18/2018   HDL 49.70 06/18/2018   LDLDIRECT 76.0 06/18/2018   LDLCALC (H) 01/31/2007    117        Total Cholesterol/HDL:CHD Risk Coronary Heart Disease Risk Table                     Men   Women  1/2 Average Risk   3.4   3.3  Average Risk       5.0   4.4  2 X Average Risk   9.6   7.1  3 X Average Risk  23.4   11.0        Use the calculated Patient Ratio above and the CHD Risk Table to determine the patient's CHD Risk.        ATP III CLASSIFICATION (LDL):  <100     mg/dL   Optimal  100-129  mg/dL  Near or Above                    Optimal  130-159  mg/dL   Borderline  160-189  mg/dL   High  >190     mg/dL   Very High   ALT 52 (H) 06/18/2018   AST 72 (H) 06/18/2018   NA 139 06/18/2018   K 4.4 06/18/2018   CL 103 06/18/2018   CREATININE 0.90 06/18/2018   BUN 17 06/18/2018   CO2 29 06/18/2018   TSH 2.64 06/18/2018   INR 1.0 07/24/2016   HGBA1C 7.9 (H) 06/18/2018    Lab Results  Component Value Date   TSH 2.64 06/18/2018   Lab Results  Component Value Date   WBC 8.9 06/18/2018   HGB 13.4 06/18/2018   HCT 40.1 06/18/2018   MCV 98.9 06/18/2018   PLT 187.0 06/18/2018   Lab Results  Component Value Date   NA 139 06/18/2018   K 4.4 06/18/2018   CHLORIDE 104 11/01/2016   CO2 29 06/18/2018    GLUCOSE 148 (H) 06/18/2018   BUN 17 06/18/2018   CREATININE 0.90 06/18/2018   BILITOT 1.0 06/18/2018   ALKPHOS 60 06/18/2018   AST 72 (H) 06/18/2018   ALT 52 (H) 06/18/2018   PROT 7.2 06/18/2018   ALBUMIN 4.1 06/18/2018   CALCIUM 9.7 06/18/2018   ANIONGAP 11 05/24/2018   EGFR 50 (L) 11/01/2016   GFR 61.02 06/18/2018   Lab Results  Component Value Date   CHOL 175 06/18/2018   Lab Results  Component Value Date   HDL 49.70 06/18/2018   Lab Results  Component Value Date   LDLCALC (H) 01/31/2007    117        Total Cholesterol/HDL:CHD Risk Coronary Heart Disease Risk Table                     Men   Women  1/2 Average Risk   3.4   3.3  Average Risk       5.0   4.4  2 X Average Risk   9.6   7.1  3 X Average Risk  23.4   11.0        Use the calculated Patient Ratio above and the CHD Risk Table to determine the patient's CHD Risk.        ATP III CLASSIFICATION (LDL):  <100     mg/dL   Optimal  100-129  mg/dL   Near or Above                    Optimal  130-159  mg/dL   Borderline  160-189  mg/dL   High  >190     mg/dL   Very High   Lab Results  Component Value Date   TRIG 254.0 (H) 06/18/2018   Lab Results  Component Value Date   CHOLHDL 4 06/18/2018   Lab Results  Component Value Date   HGBA1C 7.9 (H) 06/18/2018       Assessment & Plan:   Problem List Items Addressed This Visit    Cancer of kidney ([place])    Doing very well      Hyperlipidemia    Encouraged heart healthy diet, increase exercise, avoid trans fats, consider a krill oil cap daily      Low serum vitamin D    Supplement and monitor       DM (diabetes mellitus), type 2 ([place])  hgba1c acceptable, minimize simple carbs. Increase exercise as tolerated. Continue current meds, is tolerating Metformin          I have discontinued Ileana Ladd. Stalker's sulfamethoxazole-trimethoprim. I am also having her maintain her diphenhydramine-acetaminophen, glucosamine-chondroitin, meclizine, omeprazole,  famotidine, hyoscyamine, aspirin EC, Fish Oil, colchicine, nystatin cream, carvedilol, losartan, furosemide, furosemide, LORazepam, Vitamin D (Ergocalciferol), atorvastatin, allopurinol, and metFORMIN.  No orders of the defined types were placed in this encounter.    I discussed the assessment and treatment plan with the patient. The patient was provided an opportunity to ask questions and all were answered. The patient agreed with the plan and demonstrated an understanding of the instructions.   The patient was advised to call back or seek an in-person evaluation if the symptoms worsen or if the condition fails to improve as anticipated.  I provided 25 minutes of non-face-to-face time during this encounter.   Penni Homans, MD

## 2018-07-22 NOTE — Assessment & Plan Note (Addendum)
hgba1c acceptable, minimize simple carbs. Increase exercise as tolerated. Continue current meds, is tolerating Metformin

## 2018-07-23 ENCOUNTER — Other Ambulatory Visit (INDEPENDENT_AMBULATORY_CARE_PROVIDER_SITE_OTHER): Payer: Medicare HMO

## 2018-07-23 ENCOUNTER — Other Ambulatory Visit: Payer: Self-pay

## 2018-07-23 DIAGNOSIS — E119 Type 2 diabetes mellitus without complications: Secondary | ICD-10-CM

## 2018-07-23 LAB — COMPREHENSIVE METABOLIC PANEL
ALT: 41 U/L — ABNORMAL HIGH (ref 0–35)
AST: 49 U/L — ABNORMAL HIGH (ref 0–37)
Albumin: 4.2 g/dL (ref 3.5–5.2)
Alkaline Phosphatase: 57 U/L (ref 39–117)
BUN: 26 mg/dL — ABNORMAL HIGH (ref 6–23)
CO2: 27 mEq/L (ref 19–32)
Calcium: 10 mg/dL (ref 8.4–10.5)
Chloride: 101 mEq/L (ref 96–112)
Creatinine, Ser: 1.06 mg/dL (ref 0.40–1.20)
GFR: 50.51 mL/min — ABNORMAL LOW (ref >60.00)
Glucose, Bld: 156 mg/dL — ABNORMAL HIGH (ref 70–99)
Potassium: 4.8 mEq/L (ref 3.5–5.1)
Sodium: 137 mEq/L (ref 135–145)
Total Bilirubin: 0.9 mg/dL (ref 0.2–1.2)
Total Protein: 6.9 g/dL (ref 6.0–8.3)

## 2018-07-24 ENCOUNTER — Other Ambulatory Visit: Payer: Self-pay | Admitting: Internal Medicine

## 2018-07-24 NOTE — Telephone Encounter (Signed)
Not an anticoagulant

## 2018-08-01 ENCOUNTER — Other Ambulatory Visit: Payer: Self-pay | Admitting: Internal Medicine

## 2018-08-05 ENCOUNTER — Ambulatory Visit (INDEPENDENT_AMBULATORY_CARE_PROVIDER_SITE_OTHER): Payer: Medicare HMO

## 2018-08-05 ENCOUNTER — Other Ambulatory Visit: Payer: Self-pay

## 2018-08-05 ENCOUNTER — Ambulatory Visit (INDEPENDENT_AMBULATORY_CARE_PROVIDER_SITE_OTHER): Payer: Medicare HMO | Admitting: Orthopedic Surgery

## 2018-08-05 ENCOUNTER — Encounter: Payer: Self-pay | Admitting: Orthopedic Surgery

## 2018-08-05 DIAGNOSIS — M25511 Pain in right shoulder: Secondary | ICD-10-CM | POA: Diagnosis not present

## 2018-08-05 DIAGNOSIS — M7551 Bursitis of right shoulder: Secondary | ICD-10-CM

## 2018-08-05 MED ORDER — DIAZEPAM 5 MG PO TABS: ORAL_TABLET | ORAL | 0 refills | Status: DC

## 2018-08-06 ENCOUNTER — Encounter: Payer: Self-pay | Admitting: Orthopedic Surgery

## 2018-08-06 NOTE — Progress Notes (Signed)
Office Visit Note   Patient: Amanda Crawford           Date of Birth: 13-Sep-1945           MRN: [id number] Visit Date: 08/05/2018 Requested by: [name], MD [address] [place],  [place] [address] PCP: [name], MD  Subjective: Chief Complaint  Patient presents with   Right Shoulder - Pain    HPI: Amanda Crawford is a patient with right shoulder pain of 3 months duration.  Gradually has been worsening.  She is right-hand dominant.  She describes some decreased range of motion.  It is hard for her to reach behind her back.  Laying on it makes it ache more.  It is hard for her to sleep.  She reports some pain in the trapezial region but no numbness and tingling.  Takes Advil at night.  She does not sleep through the night.  She does have a history of cancer including kidney cancer in 2001 with resection followed by some type of lung cancer procedure in 2008.  She has another problem with her remaining kidney which she is going undergo surgery in July of this year.              ROS: All systems reviewed are negative as they relate to the chief complaint within the history of present illness.  Patient denies  fevers or chills.   Assessment & Plan: Visit Diagnoses:  1. Right shoulder pain, unspecified chronicity   2. Bursitis of right shoulder     Plan: Impression is right shoulder pain with history of multiple types of cancer.  She is having a little bit more night pain and frequent pain and I would expect just for regular bursitis.  Her exam and radiographs are pretty unrevealing.  My primary concern at this time would be that she may have some type of occult bony lesion present that were not seen on plain radiographs.  I would favor right shoulder MRI scan to evaluate for bursitis versus difficulty with recurrent cancer.  Again the most likely scenario here is bursitis in the shoulder but with her history I think it is important to rule out other sources of this night  pain.  Follow-Up Instructions: Return for after MRI.   Orders:  Orders Placed This Encounter  Procedures   XR Shoulder Right   MR SHOULDER RIGHT WO CONTRAST   Meds ordered this encounter  Medications   diazepam (VALIUM) 5 MG tablet    Sig: Take 1 tablet 1 hour prior to procedure-repeat if needed    Dispense:  3 tablet    Refill:  0      Procedures: No procedures performed   Clinical Data: No additional findings.  Objective: Vital Signs: There were no vitals taken for this visit.  Physical Exam:   Constitutional: Patient appears well-developed HEENT:  Head: Normocephalic Eyes:EOM are normal Neck: Normal range of motion Cardiovascular: Normal rate Pulmonary/chest: Effort normal Neurologic: Patient is alert Skin: Skin is warm Psychiatric: Patient has normal mood and affect    Ortho Exam: Ortho exam demonstrates good cervical spine range of motion.  5 out of 5 grip EPL FPL interosseous wrist flexion extension bicep triceps and deltoid strength with palpable radial pulses bilaterally.  She has excellent rotator cuff strength isolated infraspinatus supraspinatus and subscap muscle testing right and left shoulder with no AC joint tenderness on either side.  Impingement signs positive on the right negative  on the left.  Importantly no discrete loss of external rotation of 15 degrees of abduction and no asymmetry with forward flexion right versus left.  I will detect a lot of coarseness or grinding in the shoulder with active or passive range of motion.  Specialty Comments:  No specialty comments available.  Imaging: No results found.   PMFS History: Patient Active Problem List   Diagnosis Date Noted   Dysuria 02/13/2018   Coronary artery disease involving native coronary artery of native heart without angina pectoris 94/17/4081   Chronic systolic heart failure ([place]) 07/30/2017   Skin tags, multiple acquired 02/08/2017   UTI (urinary tract infection)  10/29/2016   Acute combined systolic and diastolic heart failure ([place]) 07/24/2016   Nonischemic cardiomyopathy ([place]) 07/24/2016   LBBB (left bundle branch block) 07/24/2016   Diarrhea 06/29/2016   DM (diabetes mellitus), type 2 ([place]) 06/29/2016   Bilateral thoracic back pain 09/12/2015   Preventative health care 09/12/2015   Allergic state 03/12/2015   Dermatitis 03/12/2015   Skin lesion of breast 03/12/2015   Gout 03/12/2015   Morbid obesity ([place]) 01/20/2015   Migraine 11/23/2014   DJD (degenerative joint disease) 11/23/2014   Benign paroxysmal positional vertigo 11/23/2014   H/O measles    Colon polyp 11/19/2014   Hx of cancer of lung 11/19/2014   GERD (gastroesophageal reflux disease) 11/19/2014   Hyperlipidemia 11/19/2014   Osteopenia 11/19/2014   Low serum vitamin D 11/19/2014   COUGH 09/16/2007   Cancer of kidney ([place]) 08/14/2007   WEIGHT GAIN, ABNORMAL 08/14/2007   Dyspnea 08/14/2007   Past Medical History:  Diagnosis Date   Allergic state 03/12/2015   Arthritis    Colon polyp 11/19/2014   Dermatitis 03/12/2015   Diarrhea 06/29/2016   DM (diabetes mellitus), type 2 ([place]) 06/29/2016   GERD (gastroesophageal reflux disease)    occ   Gout 03/12/2015   H/O measles    H/O mumps    Headache(784.0)    migraines   History of chicken pox 11/23/2014   Hyperglycemia 06/29/2016   Lung cancer ([place])    Migraine 11/23/2014   Obesity 11/23/2014   Pneumonia    child   Preventative health care 09/12/2015   Renal cell cancer ([place])    renal cell ca dx 9/01 and 8/08;   Shortness of breath    occ   Skin lesion of breast 03/12/2015    Family History  Problem Relation Age of Onset   Heart failure Father    Diabetes Father    Hyperlipidemia Father    Heart disease Father    Hypertension Father    Asthma Brother    Asthma Daughter    Cancer Paternal Grandmother    Hearing loss Paternal Grandfather    Stroke Paternal  Grandfather    Alcohol abuse Maternal Aunt     Past Surgical History:  Procedure Laterality Date   ABDOMINAL HYSTERECTOMY     APPENDECTOMY     CARDIAC CATHETERIZATION     yrs ago neg   CHOLECYSTECTOMY     IR RADIOLOGIST EVAL & MGMT  07/17/2018   LUNG CANCER SURGERY Left 08   RENAL MASS EXCISION Left 01   RIGHT/LEFT HEART CATH AND CORONARY ANGIOGRAPHY N/A 07/27/2016   Procedure: Right/Left Heart Cath and Coronary Angiography;  Surgeon: Leonie Man, MD;  Location: [facility];  Service: Cardiovascular;  Laterality: N/A;   THORACOTOMY Left 05/09/2012   Procedure: THORACOTOMY MAJOR;  Surgeon: Gaye Pollack, MD;  Location:  [facility];  Service: Thoracic;  Laterality: Left;   VIDEO BRONCHOSCOPY N/A 05/09/2012   Procedure: VIDEO BRONCHOSCOPY;  Surgeon: Gaye Pollack, MD;  Location: [facility];  Service: Thoracic;  Laterality: N/A;   WEDGE RESECTION Left 05/09/2012   Procedure: LEFT UPPER LOBE WEDGE RESECTION;  Surgeon: Gaye Pollack, MD;  Location: [facility];  Service: Thoracic;  Laterality: Left;   Social History   Occupational History   Not on file  Tobacco Use   Smoking status: Former Smoker    Packs/day: 0.50    Years: 35.00    Pack years: 17.50    Types: Cigarettes    Quit date: 05/08/2006    Years since quitting: 12.2   Smokeless tobacco: Never Used  Substance and Sexual Activity   Alcohol use: No   Drug use: No   Sexual activity: Not on file    Comment: lives with husband, no dietary restrictions.

## 2018-08-15 NOTE — Patient Instructions (Addendum)
YOU NEED TO HAVE A COVID 19 TEST ON_Saturday 7/18______ @_11 :00______, THIS TEST MUST BE DONE BEFORE SURGERY, COME TO [facility] ENTRANCE.  ONCE YOUR COVID TEST IS COMPLETED, PLEASE BEGIN THE QUARANTINE INSTRUCTIONS AS OUTLINED IN YOUR HANDOUT.                Amanda Crawford    Your procedure is scheduled on: Wednesday 08/21/18    Report to [facility] Main  Entrance  Report to admitting at  6:30 AM  Then Short stay.      Call this number if you have problems the morning of surgery [phone]    Remember: Do not eat food or drink liquids :After Midnight.            BRUSH YOUR TEETH MORNING OF SURGERY AND RINSE YOUR MOUTH OUT, NO CHEWING GUM CANDY OR MINTS.     Take these medicines the morning of surgery with A SIP OF WATER: Coreg, Allopurinol   DO NOT TAKE ANY DIABETIC MEDICATIONS DAY OF YOUR SURGERY      How to Manage Your Diabetes Before and After Surgery  Why is it important to control my blood sugar before and after surgery? . Improving blood sugar levels before and after surgery helps healing and can limit problems. . A way of improving blood sugar control is eating a healthy diet by: o  Eating less sugar and carbohydrates o  Increasing activity/exercise o  Talking with your doctor about reaching your blood sugar goals . High blood sugars (greater than 180 mg/dL) can raise your risk of infections and slow your recovery, so you will need to focus on controlling your diabetes during the weeks before surgery. . Make sure that the doctor who takes care of your diabetes knows about your planned surgery including the date and location.  How do I manage my blood sugar before surgery? . Check your blood sugar at least 4 times a day, starting 2 days before surgery, to make sure that the level is not too high or low. o Check your blood sugar the morning of your surgery when you wake up and every 2 hours until you get to the Short Stay  unit. . If your blood sugar is less than 70 mg/dL, you will need to treat for low blood sugar: o Do not take insulin. o Treat a low blood sugar (less than 70 mg/dL) with  cup of clear juice (cranberry or apple), 4 glucose tablets, OR glucose gel. o Recheck blood sugar in 15 minutes after treatment (to make sure it is greater than 70 mg/dL). If your blood sugar is not greater than 70 mg/dL on recheck, call [phone] for further instructions. . Report your blood sugar to the short stay nurse when you get to Short Stay.  . If you are admitted to the hospital after surgery: o Your blood sugar will be checked by the staff and you will probably be given insulin after surgery (instead of oral diabetes medicines) to make sure you have good blood sugar levels. o The goal for blood sugar control after surgery is 80-180 mg/dL.   WHAT DO I DO ABOUT MY DIABETES MEDICATION?              You may not have any metal on your body including piercings              Do not wear jewelry, make-up, lotions, powders or perfumes, deodorant  Do not wear nail polish.  Do not shave  48 hours prior to surgery.         Do not bring valuables to the hospital. [place].  Contacts, dentures or bridgework may not be worn into surgery.   [place] - Preparing for Surgery Before surgery, you can play an important role.   Because skin is not sterile, your skin needs to be as free of germs as possible.   You can reduce the number of germs on your skin by washing with CHG (chlorahexidine gluconate) soap before surgery .  CHG is an antiseptic cleaner which kills germs and bonds with the skin to continue killing germs even after washing. Please DO NOT use if you have an allergy to CHG or antibacterial soaps.   If your skin becomes reddened/irritated stop using the CHG and inform your nurse when you arrive at Short Stay. Do not shave (including legs and  underarms) for at least 48 hours prior to the first CHG showerPlease follow these instructions carefully:  1.  Shower with CHG Soap the night before surgery and the  morning of Surgery.  2.  If you choose to wash your hair, wash your hair first as usual with your  normal  shampoo.  3.  After you shampoo, rinse your hair and body thoroughly to remove the  shampoo.                                        4.  Use CHG as you would any other liquid soap.  You can apply chg directly  to the skin and wash                       Gently with a scrungie or clean washcloth.  5.  Apply the CHG Soap to your body ONLY FROM THE NECK DOWN.   Do not use on face/ open                           Wound or open sores. Avoid contact with eyes, ears mouth and genitals (private parts).                       Wash face,  Genitals (private parts) with your normal soap.             6.  Wash thoroughly, paying special attention to the area where your surgery  will be performed.  7.  Thoroughly rinse your body with warm water from the neck down.  8.  DO NOT shower/wash with your normal soap after using and rinsing off  the CHG Soap.             9.  Pat yourself dry with a clean towel.            10.  Wear clean pajamas.            11.  Place clean sheets on your bed the night of your first shower and do not  sleep with pets.  Day of Surgery : Do not apply any lotions/deodorants the morning of surgery.  Please wear clean clothes to the hospital/surgery center.   FAILURE TO FOLLOW THESE INSTRUCTIONS  MAY RESULT IN THE CANCELLATION OF YOUR SURGERY PATIENT SIGNATURE_________________________________  NURSE SIGNATURE__________________________________  ________________________________________________________________________     Patients discharged the day of surgery will not be allowed to drive home.  IF YOU ARE HAVING SURGERY AND GOING HOME THE SAME DAY, YOU MUST HAVE AN ADULT TO DRIVE YOU HOME AND BE WITH YOU FOR 24 HOURS .  YOU MAY GO HOME BY TAXI OR UBER OR ORTHERWISE, BUT AN ADULT MUST ACCOMPANY YOU HOME AND STAY WITH YOU FOR 24 HOURS.  Name and phone number of your driver:  Special Instructions: N/A              Please read over the following fact sheets you were given: _____________________________________________________________________

## 2018-08-16 ENCOUNTER — Other Ambulatory Visit: Payer: Self-pay | Admitting: Radiology

## 2018-08-17 ENCOUNTER — Other Ambulatory Visit (HOSPITAL_COMMUNITY)
Admission: RE | Admit: 2018-08-17 | Discharge: 2018-08-17 | Disposition: A | Discharge status: Home / Self Care | Payer: Medicare HMO | Source: Ambulatory Visit | Attending: Interventional Radiology | Admitting: Interventional Radiology

## 2018-08-17 DIAGNOSIS — Z1159 Encounter for screening for other viral diseases: Secondary | ICD-10-CM | POA: Insufficient documentation

## 2018-08-17 LAB — SARS CORONAVIRUS 2 (TAT 6-24 HRS): SARS Coronavirus 2: NEGATIVE

## 2018-08-19 ENCOUNTER — Other Ambulatory Visit: Payer: Self-pay

## 2018-08-19 ENCOUNTER — Encounter (HOSPITAL_COMMUNITY): Payer: Self-pay

## 2018-08-19 ENCOUNTER — Ambulatory Visit (HOSPITAL_COMMUNITY)
Admission: RE | Admit: 2018-08-19 | Discharge: 2018-08-19 | Disposition: A | Discharge status: Home / Self Care | Payer: Medicare HMO | Source: Ambulatory Visit | Attending: Radiology | Admitting: Radiology

## 2018-08-19 ENCOUNTER — Encounter (HOSPITAL_COMMUNITY)
Admission: RE | Admit: 2018-08-19 | Discharge: 2018-08-19 | Disposition: A | Discharge status: Home / Self Care | Payer: Medicare HMO | Source: Ambulatory Visit | Attending: Interventional Radiology | Admitting: Interventional Radiology

## 2018-08-19 DIAGNOSIS — Z01818 Encounter for other preprocedural examination: Secondary | ICD-10-CM | POA: Insufficient documentation

## 2018-08-19 DIAGNOSIS — E119 Type 2 diabetes mellitus without complications: Secondary | ICD-10-CM | POA: Insufficient documentation

## 2018-08-19 DIAGNOSIS — Z7982 Long term (current) use of aspirin: Secondary | ICD-10-CM | POA: Diagnosis not present

## 2018-08-19 DIAGNOSIS — I2510 Atherosclerotic heart disease of native coronary artery without angina pectoris: Secondary | ICD-10-CM | POA: Insufficient documentation

## 2018-08-19 DIAGNOSIS — Z79899 Other long term (current) drug therapy: Secondary | ICD-10-CM | POA: Diagnosis not present

## 2018-08-19 DIAGNOSIS — I447 Left bundle-branch block, unspecified: Secondary | ICD-10-CM | POA: Diagnosis not present

## 2018-08-19 DIAGNOSIS — N2889 Other specified disorders of kidney and ureter: Secondary | ICD-10-CM | POA: Insufficient documentation

## 2018-08-19 DIAGNOSIS — K219 Gastro-esophageal reflux disease without esophagitis: Secondary | ICD-10-CM | POA: Insufficient documentation

## 2018-08-19 DIAGNOSIS — Z87891 Personal history of nicotine dependence: Secondary | ICD-10-CM | POA: Diagnosis not present

## 2018-08-19 DIAGNOSIS — Z7984 Long term (current) use of oral hypoglycemic drugs: Secondary | ICD-10-CM | POA: Insufficient documentation

## 2018-08-19 DIAGNOSIS — Z85528 Personal history of other malignant neoplasm of kidney: Secondary | ICD-10-CM | POA: Diagnosis not present

## 2018-08-19 DIAGNOSIS — Z905 Acquired absence of kidney: Secondary | ICD-10-CM | POA: Insufficient documentation

## 2018-08-19 LAB — BASIC METABOLIC PANEL
Anion gap: 11 (ref 5–15)
BUN: 21 mg/dL (ref 8–23)
CO2: 25 mmol/L (ref 22–32)
Calcium: 9.9 mg/dL (ref 8.9–10.3)
Chloride: 101 mmol/L (ref 98–111)
Creatinine, Ser: 1.13 mg/dL — ABNORMAL HIGH (ref 0.44–1.00)
GFR calc Af Amer: 55 mL/min — ABNORMAL LOW (ref >60)
GFR calc non Af Amer: 48 mL/min — ABNORMAL LOW (ref >60)
Glucose, Bld: 98 mg/dL (ref 70–99)
Potassium: 4.4 mmol/L (ref 3.5–5.1)
Sodium: 137 mmol/L (ref 135–145)

## 2018-08-19 LAB — CBC WITH DIFFERENTIAL/PLATELET
Abs Immature Granulocytes: 0.04 10*3/uL (ref 0.00–0.07)
Basophils Absolute: 0.1 10*3/uL (ref 0.0–0.1)
Basophils Relative: 1 %
Eosinophils Absolute: 0.2 10*3/uL (ref 0.0–0.5)
Eosinophils Relative: 2 %
HCT: 40.3 % (ref 36.0–46.0)
Hemoglobin: 12.6 g/dL (ref 12.0–15.0)
Immature Granulocytes: 0 %
Lymphocytes Relative: 27 %
Lymphs Abs: 2.7 10*3/uL (ref 0.7–4.0)
MCH: 32 pg (ref 26.0–34.0)
MCHC: 31.3 g/dL (ref 30.0–36.0)
MCV: 102.3 fL — ABNORMAL HIGH (ref 80.0–100.0)
Monocytes Absolute: 0.8 10*3/uL (ref 0.1–1.0)
Monocytes Relative: 8 %
Neutro Abs: 6.2 10*3/uL (ref 1.7–7.7)
Neutrophils Relative %: 62 %
Platelets: 212 10*3/uL (ref 150–400)
RBC: 3.94 MIL/uL (ref 3.87–5.11)
RDW: 14.4 % (ref 11.5–15.5)
WBC: 9.9 10*3/uL (ref 4.0–10.5)
nRBC: 0 % (ref 0.0–0.2)

## 2018-08-19 LAB — PROTIME-INR
INR: 1.1 (ref 0.8–1.2)
Prothrombin Time: 14.2 seconds (ref 11.4–15.2)

## 2018-08-19 LAB — ABO/RH: ABO/RH(D): A POS

## 2018-08-19 LAB — HEMOGLOBIN A1C
Hgb A1c MFr Bld: 6.8 % — ABNORMAL HIGH (ref 4.8–5.6)
Mean Plasma Glucose: 148.46 mg/dL

## 2018-08-19 LAB — GLUCOSE, CAPILLARY: Glucose-Capillary: 92 mg/dL (ref 70–99)

## 2018-08-20 ENCOUNTER — Other Ambulatory Visit: Payer: Self-pay | Admitting: Radiology

## 2018-08-20 NOTE — Progress Notes (Signed)
Anesthesia Chart Review   Case: [id number] Date/Time: 08/21/18 0815   Procedure: CT WITH ANESTHESIA RENAL CRYOABLATION (N/A )   Anesthesia type: General   Pre-op diagnosis: RENAL MASS   [facility]   Surgeon: Arne Cleveland, MD      DISCUSSION:73 y.o. former smoker (17.5 pack years, quit 4/8/8/) with h/o migraines, GERD, DM II, LBBB, mild nonobstructive CAD by cath 06/2016, renal cell cancer s/p left radical nephrectomy 2001 w/ metastatic disease to lung s/p left wedge resection 2008 and 2014, renal mass scheduled for above procedure 08/21/2018 with Dr. Arne Cleveland.   Last seen by cardiologist, Dr. Lyman Bishop, 07/31/2018.  Per OV note, LVEF improved, 45-50% on echo 04/2017.  Symptomatically doing well.    Anticipate pt can proceed with planned procedure barring acute status change.   VS: BP 113/62 (BP Location: Right Arm)   Pulse 76   Temp 37.2 C (Oral)   Resp 18   Ht 5\' 4"  (1.626 m)   Wt 97.3 kg   SpO2 98%   BMI 36.81 kg/m   PROVIDERS: [name], MD is PCP   Lyman Bishop, MD is Cardiologist  LABS: Labs reviewed: Acceptable for surgery. (all labs ordered are listed, but only abnormal results are displayed)  Labs Reviewed  BASIC METABOLIC PANEL - Abnormal; Notable for the following components:      Result Value   Creatinine, Ser 1.13 (*)    GFR calc non Af Amer 48 (*)    GFR calc Af Amer 55 (*)    All other components within normal limits  CBC WITH DIFFERENTIAL/PLATELET - Abnormal; Notable for the following components:   MCV 102.3 (*)    All other components within normal limits  HEMOGLOBIN A1C - Abnormal; Notable for the following components:   Hgb A1c MFr Bld 6.8 (*)    All other components within normal limits  PROTIME-INR  GLUCOSE, CAPILLARY  TYPE AND SCREEN  ABO/RH     IMAGES: Chest Xray 08/19/2018 FINDINGS: Lungs are adequately inflated and otherwise clear. Cardiomediastinal silhouette is within normal. Several surgical clips along  the left hilar region. Remainder of the exam is unchanged.  IMPRESSION: No acute cardiopulmonary disease.  EKG: 08/19/2018 Rate 75 bpm Normal sinus rhythm  LBBB  CV: Echo 05/21/17 Study Conclusions  - Left ventricle: The cavity size was normal. Wall thickness was   increased in a pattern of moderate LVH. Systolic function was   mildly reduced. The estimated ejection fraction was in the range   of 45% to 50%. Diffuse hypokinesis. Doppler parameters are   consistent with abnormal left ventricular relaxation (grade 1   diastolic dysfunction). The E/e&' ratio is between 8-15,   suggesting indeterminate LV filling pressure. Ejection fraction   (MOD, 2-plane): 46%. - Aortic valve: Sclerosis without stenosis. There was no   regurgitation. - Mitral valve: Mildly thickened leaflets . There was trivial   regurgitation. - Left atrium: The atrium was normal in size. - Tricuspid valve: There was trivial regurgitation. - Pulmonary arteries: PA peak pressure: 17 mm Hg (S). - Inferior vena cava: The vessel was normal in size. The   respirophasic diameter changes were in the normal range (>= 50%),   consistent with normal central venous pressure.  Impressions:  - Compared to a prior study in 2016, the LVEF has improved to   45-50%.  Cardiac Cath 07/27/16  Mid LAD lesion, 25 %stenosed.  There is moderate left ventricular systolic dysfunction.  LV end diastolic  pressure is moderately elevated.  The left ventricular ejection fraction is 35-45% by visual estimate.  Hemodynamic findings consistent with mild pulmonary hypertension.     Nonischemic cardiomyopathy: EF appears more to be 35-45% with apical hypokinesis.  Angiographically normal coronary arteries with a Circumflex dominant system. Past Medical History:  Diagnosis Date  . Allergic state 03/12/2015  . Arthritis   . Colon polyp 11/19/2014  . Dermatitis 03/12/2015  . Diarrhea 06/29/2016  . DM (diabetes mellitus), type  2 ([place]) 06/29/2016  . GERD (gastroesophageal reflux disease)    occ  . Gout 03/12/2015  . H/O measles   . H/O mumps   . Headache(784.0)    migraines  . History of chicken pox 11/23/2014  . Hyperglycemia 06/29/2016  . Lung cancer ([place])   . Migraine 11/23/2014  . Obesity 11/23/2014  . Pneumonia    child  . Preventative health care 09/12/2015  . Renal cell cancer ([place])    renal cell ca dx 9/01 and 8/08;  . Shortness of breath    occ  . Skin lesion of breast 03/12/2015    Past Surgical History:  Procedure Laterality Date  . ABDOMINAL HYSTERECTOMY    . APPENDECTOMY    . CARDIAC CATHETERIZATION     yrs ago neg  . CHOLECYSTECTOMY    . IR RADIOLOGIST EVAL & MGMT  07/17/2018  . LUNG CANCER SURGERY Left 08  . RENAL MASS EXCISION Left 01  . RIGHT/LEFT HEART CATH AND CORONARY ANGIOGRAPHY N/A 07/27/2016   Procedure: Right/Left Heart Cath and Coronary Angiography;  Surgeon: Leonie Man, MD;  Location: [facility] CV LAB;  Service: Cardiovascular;  Laterality: N/A;  . THORACOTOMY Left 05/09/2012   Procedure: THORACOTOMY MAJOR;  Surgeon: Gaye Pollack, MD;  Location: [facility];  Service: Thoracic;  Laterality: Left;  Marland Kitchen VIDEO BRONCHOSCOPY N/A 05/09/2012   Procedure: VIDEO BRONCHOSCOPY;  Surgeon: Gaye Pollack, MD;  Location: [facility];  Service: Thoracic;  Laterality: N/A;  . WEDGE RESECTION Left 05/09/2012   Procedure: LEFT UPPER LOBE WEDGE RESECTION;  Surgeon: Gaye Pollack, MD;  Location: [facility];  Service: Thoracic;  Laterality: Left;    MEDICATIONS: . allopurinol (ZYLOPRIM) 300 MG tablet  . aspirin EC 81 MG tablet  . atorvastatin (LIPITOR) 10 MG tablet  . Calcium Carb-Cholecalciferol (CALCIUM 600 + D PO)  . carvedilol (COREG) 6.25 MG tablet  . diazepam (VALIUM) 5 MG tablet  . diphenhydramine-acetaminophen (TYLENOL PM) 25-500 MG TABS  . furosemide (LASIX) 40 MG tablet  . hyoscyamine (LEVSIN SL) 0.125 MG SL tablet  . LORazepam (ATIVAN) 1 MG tablet  . losartan (COZAAR) 25 MG tablet  .  meclizine (ANTIVERT) 12.5 MG tablet  . metFORMIN (GLUCOPHAGE) 500 MG tablet  . nystatin cream (MYCOSTATIN)  . Omega-3 Fatty Acids (FISH OIL) 1000 MG CAPS  . Vitamin D, Ergocalciferol, (DRISDOL) 1.25 MG (50000 UT) CAPS capsule   No current facility-administered medications for this encounter.      Maia Plan [facility] Pre-Surgical Testing [phone] 08/20/18  12:28 PM

## 2018-08-20 NOTE — Progress Notes (Signed)
Konrad Felix PA  ASA was stopped 7/16 per Dr. Charlett Blake.  Her cardiologist is Dr. Alma Friendly she had a heart Cath 2018. Her Pulmonologist is Dr. Melvyn Novas. ECHO, CXR, Labs are in [facility]

## 2018-08-20 NOTE — Anesthesia Preprocedure Evaluation (Addendum)
Anesthesia Evaluation  Patient identified by MRN, date of birth, ID band Patient awake    Reviewed: Allergy & Precautions, NPO status , Patient's Chart, lab work & pertinent test results  Airway Mallampati: II  TM Distance: >3 FB Neck ROM: Full    Dental no notable dental hx. (+) Teeth Intact   Pulmonary shortness of breath, former smoker,  Hx of lung CA S/p wedge resection   Pulmonary exam normal breath sounds clear to auscultation       Cardiovascular Exercise Tolerance: Good + CAD  Normal cardiovascular exam Rhythm:Regular Rate:Normal  [facility] 05/21/17  Left ventricle: The cavity size was normal. Wall thickness was   increased in a pattern of moderate LVH. Systolic function was   mildly reduced. The estimated ejection fraction was in the range   of 45% to 50%. Diffuse hypokinesis   Neuro/Psych  Headaches,    GI/Hepatic GERD  ,  Endo/Other  diabetes, Type 2  Renal/GU Renal cell CA     Musculoskeletal   Abdominal (+) + obese,   Peds  Hematology   Anesthesia Other Findings   Reproductive/Obstetrics                           Lab Results  Component Value Date   WBC 9.9 08/19/2018   HGB 12.6 08/19/2018   HCT 40.3 08/19/2018   MCV 102.3 (H) 08/19/2018   PLT 212 08/19/2018   Lab Results  Component Value Date   CREATININE 1.13 (H) 08/19/2018   BUN 21 08/19/2018   NA 137 08/19/2018   K 4.4 08/19/2018   CL 101 08/19/2018   CO2 25 08/19/2018     Anesthesia Physical Anesthesia Plan  ASA: III  Anesthesia Plan: General   Post-op Pain Management:    Induction: Intravenous  PONV Risk Score and Plan: 3 and Treatment may vary due to age or medical condition, Ondansetron and Dexamethasone  Airway Management Planned: Oral ETT  Additional Equipment:   Intra-op Plan:   Post-operative Plan: Extubation in OR  Informed Consent: I have reviewed the patients History and Physical, chart,  labs and discussed the procedure including the risks, benefits and alternatives for the proposed anesthesia with the patient or authorized representative who has indicated his/her understanding and acceptance.     Dental advisory given  Plan Discussed with: CRNA  Anesthesia Plan Comments: (See PAT note 08/19/2018, Konrad Felix, PA-C)      Anesthesia Quick Evaluation

## 2018-08-21 ENCOUNTER — Observation Stay (HOSPITAL_COMMUNITY)
Admission: AD | Admit: 2018-08-21 | Discharge: 2018-08-22 | Disposition: A | Discharge status: Home / Self Care | Payer: Medicare HMO | Attending: Interventional Radiology | Admitting: Interventional Radiology

## 2018-08-21 ENCOUNTER — Ambulatory Visit (HOSPITAL_COMMUNITY): Payer: Medicare HMO | Admitting: Anesthesiology

## 2018-08-21 ENCOUNTER — Ambulatory Visit (HOSPITAL_COMMUNITY)
Admission: RE | Admit: 2018-08-21 | Discharge: 2018-08-21 | Disposition: A | Discharge status: Home / Self Care | Payer: Medicare HMO | Source: Ambulatory Visit | Attending: Interventional Radiology | Admitting: Interventional Radiology

## 2018-08-21 ENCOUNTER — Encounter (HOSPITAL_COMMUNITY)
Admission: AD | Disposition: A | Discharge status: Home / Self Care | Payer: Self-pay | Source: Home / Self Care | Attending: Interventional Radiology

## 2018-08-21 ENCOUNTER — Other Ambulatory Visit: Payer: Self-pay

## 2018-08-21 ENCOUNTER — Ambulatory Visit (HOSPITAL_COMMUNITY): Payer: Medicare HMO | Admitting: Physician Assistant

## 2018-08-21 ENCOUNTER — Encounter (HOSPITAL_COMMUNITY): Payer: Self-pay | Admitting: General Practice

## 2018-08-21 DIAGNOSIS — Z7982 Long term (current) use of aspirin: Secondary | ICD-10-CM | POA: Insufficient documentation

## 2018-08-21 DIAGNOSIS — Z79899 Other long term (current) drug therapy: Secondary | ICD-10-CM | POA: Insufficient documentation

## 2018-08-21 DIAGNOSIS — N2889 Other specified disorders of kidney and ureter: Secondary | ICD-10-CM | POA: Diagnosis not present

## 2018-08-21 DIAGNOSIS — Z85118 Personal history of other malignant neoplasm of bronchus and lung: Secondary | ICD-10-CM | POA: Insufficient documentation

## 2018-08-21 DIAGNOSIS — C641 Malignant neoplasm of right kidney, except renal pelvis: Secondary | ICD-10-CM

## 2018-08-21 DIAGNOSIS — E119 Type 2 diabetes mellitus without complications: Secondary | ICD-10-CM | POA: Diagnosis not present

## 2018-08-21 DIAGNOSIS — E785 Hyperlipidemia, unspecified: Secondary | ICD-10-CM | POA: Diagnosis not present

## 2018-08-21 DIAGNOSIS — Z85528 Personal history of other malignant neoplasm of kidney: Secondary | ICD-10-CM | POA: Diagnosis not present

## 2018-08-21 DIAGNOSIS — C651 Malignant neoplasm of right renal pelvis: Secondary | ICD-10-CM | POA: Diagnosis not present

## 2018-08-21 DIAGNOSIS — Z7984 Long term (current) use of oral hypoglycemic drugs: Secondary | ICD-10-CM | POA: Diagnosis not present

## 2018-08-21 DIAGNOSIS — M1990 Unspecified osteoarthritis, unspecified site: Secondary | ICD-10-CM | POA: Insufficient documentation

## 2018-08-21 DIAGNOSIS — Z905 Acquired absence of kidney: Secondary | ICD-10-CM | POA: Diagnosis not present

## 2018-08-21 DIAGNOSIS — I2510 Atherosclerotic heart disease of native coronary artery without angina pectoris: Secondary | ICD-10-CM | POA: Diagnosis not present

## 2018-08-21 DIAGNOSIS — K219 Gastro-esophageal reflux disease without esophagitis: Secondary | ICD-10-CM | POA: Diagnosis not present

## 2018-08-21 HISTORY — PX: RADIOLOGY WITH ANESTHESIA: SHX6223

## 2018-08-21 LAB — BASIC METABOLIC PANEL
Anion gap: 12 (ref 5–15)
BUN: 25 mg/dL — ABNORMAL HIGH (ref 8–23)
CO2: 21 mmol/L — ABNORMAL LOW (ref 22–32)
Calcium: 9.8 mg/dL (ref 8.9–10.3)
Chloride: 105 mmol/L (ref 98–111)
Creatinine, Ser: 1 mg/dL (ref 0.44–1.00)
GFR calc Af Amer: >60 mL/min (ref >60)
GFR calc non Af Amer: 55 mL/min — ABNORMAL LOW (ref >60)
Glucose, Bld: 153 mg/dL — ABNORMAL HIGH (ref 70–99)
Potassium: 4.5 mmol/L (ref 3.5–5.1)
Sodium: 138 mmol/L (ref 135–145)

## 2018-08-21 LAB — GLUCOSE, CAPILLARY
Glucose-Capillary: 176 mg/dL — ABNORMAL HIGH (ref 70–99)
Glucose-Capillary: 173 mg/dL — ABNORMAL HIGH (ref 70–99)

## 2018-08-21 LAB — TYPE AND SCREEN
ABO/RH(D): A POS
Antibody Screen: NEGATIVE

## 2018-08-21 SURGERY — CT WITH ANESTHESIA
Anesthesia: General

## 2018-08-21 MED ORDER — DEXAMETHASONE SODIUM PHOSPHATE 10 MG/ML IJ SOLN
INTRAMUSCULAR | Status: DC | PRN
  Administered 2018-08-21: 10 mg via INTRAVENOUS

## 2018-08-21 MED ORDER — SENNOSIDES-DOCUSATE SODIUM 8.6-50 MG PO TABS: 1.0000 | ORAL_TABLET | Freq: Every day | ORAL | Status: DC | PRN

## 2018-08-21 MED ORDER — FENTANYL CITRATE (PF) 100 MCG/2ML IJ SOLN: 25.0000 ug | INTRAMUSCULAR | Status: DC | PRN

## 2018-08-21 MED ORDER — ACETAMINOPHEN 10 MG/ML IV SOLN: 1000.0000 mg | Freq: Once | INTRAVENOUS | Status: DC | PRN

## 2018-08-21 MED ORDER — HYOSCYAMINE SULFATE 0.125 MG SL SUBL
0.1250 mg | SUBLINGUAL_TABLET | SUBLINGUAL | Status: DC | PRN
  Filled 2018-08-21: qty 1

## 2018-08-21 MED ORDER — VITAMIN D (ERGOCALCIFEROL) 1.25 MG (50000 UNIT) PO CAPS: 50000.0000 [IU] | ORAL_CAPSULE | ORAL | Status: DC

## 2018-08-21 MED ORDER — SODIUM CHLORIDE (PF) 0.9 % IJ SOLN
INTRAMUSCULAR | Status: AC
  Filled 2018-08-21: qty 50

## 2018-08-21 MED ORDER — ATORVASTATIN CALCIUM 10 MG PO TABS
10.0000 mg | ORAL_TABLET | ORAL | Status: DC
  Administered 2018-08-21: 10 mg via ORAL
  Filled 2018-08-21: qty 1

## 2018-08-21 MED ORDER — SODIUM CHLORIDE 0.9 % IV SOLN
INTRAVENOUS | Status: DC | PRN
  Administered 2018-08-21: 40 ug/min via INTRAVENOUS

## 2018-08-21 MED ORDER — PROPOFOL 10 MG/ML IV BOLUS
INTRAVENOUS | Status: DC | PRN
  Administered 2018-08-21: 150 mg via INTRAVENOUS

## 2018-08-21 MED ORDER — ONDANSETRON HCL 4 MG/2ML IJ SOLN: 4.0000 mg | Freq: Once | INTRAMUSCULAR | Status: DC | PRN

## 2018-08-21 MED ORDER — ONDANSETRON HCL 4 MG/2ML IJ SOLN
INTRAMUSCULAR | Status: DC | PRN
  Administered 2018-08-21: 4 mg via INTRAVENOUS

## 2018-08-21 MED ORDER — SUCCINYLCHOLINE CHLORIDE 200 MG/10ML IV SOSY
PREFILLED_SYRINGE | INTRAVENOUS | Status: DC | PRN
  Administered 2018-08-21: 100 mg via INTRAVENOUS

## 2018-08-21 MED ORDER — LACTATED RINGERS IV SOLN
INTRAVENOUS | Status: DC
  Administered 2018-08-21 (×2): via INTRAVENOUS

## 2018-08-21 MED ORDER — PHENYLEPHRINE 40 MCG/ML (10ML) SYRINGE FOR IV PUSH (FOR BLOOD PRESSURE SUPPORT)
PREFILLED_SYRINGE | INTRAVENOUS | Status: DC | PRN
  Administered 2018-08-21 (×4): 80 ug via INTRAVENOUS

## 2018-08-21 MED ORDER — CARVEDILOL 6.25 MG PO TABS
6.2500 mg | ORAL_TABLET | Freq: Two times a day (BID) | ORAL | Status: DC
  Administered 2018-08-21 – 2018-08-22 (×2): 6.25 mg via ORAL
  Filled 2018-08-21 (×2): qty 1

## 2018-08-21 MED ORDER — SENNOSIDES-DOCUSATE SODIUM 8.6-50 MG PO TABS
1.0000 | ORAL_TABLET | Freq: Every day | ORAL | Status: DC | PRN
  Filled 2018-08-21: qty 1

## 2018-08-21 MED ORDER — CEFAZOLIN SODIUM-DEXTROSE 2-4 GM/100ML-% IV SOLN
2.0000 g | INTRAVENOUS | Status: AC
  Administered 2018-08-21: 2 g via INTRAVENOUS
  Filled 2018-08-21: qty 100

## 2018-08-21 MED ORDER — HYDROCODONE-ACETAMINOPHEN 5-325 MG PO TABS: 1.0000 | ORAL_TABLET | ORAL | Status: DC | PRN

## 2018-08-21 MED ORDER — MIDAZOLAM HCL 2 MG/2ML IJ SOLN
INTRAMUSCULAR | Status: DC | PRN
  Administered 2018-08-21 (×2): 1 mg via INTRAVENOUS

## 2018-08-21 MED ORDER — ONDANSETRON HCL 4 MG/2ML IJ SOLN: 4.0000 mg | Freq: Four times a day (QID) | INTRAMUSCULAR | Status: DC | PRN

## 2018-08-21 MED ORDER — LOSARTAN POTASSIUM 25 MG PO TABS
25.0000 mg | ORAL_TABLET | Freq: Every day | ORAL | Status: DC
  Administered 2018-08-21 – 2018-08-22 (×2): 25 mg via ORAL
  Filled 2018-08-21 (×2): qty 1

## 2018-08-21 MED ORDER — IOHEXOL 350 MG/ML SOLN
100.0000 mL | Freq: Once | INTRAVENOUS | Status: AC | PRN
  Administered 2018-08-21: 80 mL via INTRAVENOUS

## 2018-08-21 MED ORDER — LIDOCAINE 2% (20 MG/ML) 5 ML SYRINGE
INTRAMUSCULAR | Status: DC | PRN
  Administered 2018-08-21: 100 mg via INTRAVENOUS

## 2018-08-21 MED ORDER — MIDAZOLAM HCL 2 MG/2ML IJ SOLN
INTRAMUSCULAR | Status: AC
  Filled 2018-08-21: qty 2

## 2018-08-21 MED ORDER — MECLIZINE HCL 25 MG PO TABS: 12.5000 mg | ORAL_TABLET | Freq: Three times a day (TID) | ORAL | Status: DC | PRN

## 2018-08-21 MED ORDER — FUROSEMIDE 40 MG PO TABS
40.0000 mg | ORAL_TABLET | Freq: Every day | ORAL | Status: DC
  Administered 2018-08-21 – 2018-08-22 (×2): 40 mg via ORAL
  Filled 2018-08-21 (×2): qty 1

## 2018-08-21 MED ORDER — SUGAMMADEX SODIUM 200 MG/2ML IV SOLN
INTRAVENOUS | Status: DC | PRN
  Administered 2018-08-21: 200 mg via INTRAVENOUS

## 2018-08-21 MED ORDER — ALLOPURINOL 300 MG PO TABS
300.0000 mg | ORAL_TABLET | Freq: Every day | ORAL | Status: DC
  Administered 2018-08-22: 300 mg via ORAL
  Filled 2018-08-21: qty 1

## 2018-08-21 MED ORDER — SODIUM CHLORIDE 0.9 % IV SOLN
INTRAVENOUS | Status: DC
  Administered 2018-08-21 – 2018-08-22 (×2): via INTRAVENOUS

## 2018-08-21 MED ORDER — DOCUSATE SODIUM 100 MG PO CAPS
100.0000 mg | ORAL_CAPSULE | Freq: Two times a day (BID) | ORAL | Status: DC
  Filled 2018-08-21: qty 1

## 2018-08-21 MED ORDER — DOCUSATE SODIUM 100 MG PO CAPS: 100.0000 mg | ORAL_CAPSULE | Freq: Two times a day (BID) | ORAL | Status: DC

## 2018-08-21 MED ORDER — ROCURONIUM BROMIDE 10 MG/ML (PF) SYRINGE
PREFILLED_SYRINGE | INTRAVENOUS | Status: DC | PRN
  Administered 2018-08-21: 70 mg via INTRAVENOUS

## 2018-08-21 MED ORDER — FENTANYL CITRATE (PF) 250 MCG/5ML IJ SOLN
INTRAMUSCULAR | Status: AC
  Filled 2018-08-21: qty 5

## 2018-08-21 MED ORDER — FENTANYL CITRATE (PF) 250 MCG/5ML IJ SOLN
INTRAMUSCULAR | Status: DC | PRN
  Administered 2018-08-21: 50 ug via INTRAVENOUS
  Administered 2018-08-21: 100 ug via INTRAVENOUS

## 2018-08-21 NOTE — Sedation Documentation (Signed)
Anesthesia in to sedate and monitor. 

## 2018-08-21 NOTE — Care Management CC44 (Signed)
Condition Code 44 Documentation Completed  Patient Details  Name: Amanda Crawford MRN: [id number] Date of Birth: Apr 03, 1945   Condition Code 44 given:  Yes Patient signature on Condition Code 44 notice:  Yes Documentation of 2 MD's agreement:  Yes Code 44 added to claim:  Yes    Lia Hopping, LCSW 08/21/2018, 5:09 PM

## 2018-08-21 NOTE — Anesthesia Postprocedure Evaluation (Signed)
Anesthesia Post Note  Patient: Amanda Crawford  Procedure(s) Performed: CT WITH ANESTHESIA RENAL CRYOABLATION (N/A )     Patient location during evaluation: PACU Anesthesia Type: General Level of consciousness: awake and alert Pain management: pain level controlled Vital Signs Assessment: post-procedure vital signs reviewed and stable Respiratory status: spontaneous breathing, nonlabored ventilation, respiratory function stable and patient connected to nasal cannula oxygen Cardiovascular status: blood pressure returned to baseline and stable Postop Assessment: no apparent nausea or vomiting Anesthetic complications: no    Last Vitals:  Vitals:   08/21/18 1315 08/21/18 1813  BP: (!) 158/78 138/78  Pulse: 86 80  Resp: (!) 21 18  Temp:  37 C  SpO2: 100% 97%    Last Pain:  Vitals:   08/21/18 1813  TempSrc: Oral  PainSc:                  [place]et [place]

## 2018-08-21 NOTE — Care Management Obs Status (Signed)
[place] NOTIFICATION   Patient Details  Name: Amanda Crawford MRN: [id number] Date of Birth: 1945-10-05   Medicare Observation Status Notification Given:  Yes    Lia Hopping, [place] 08/21/2018, 5:09 PM

## 2018-08-21 NOTE — Progress Notes (Signed)
Assessment completed before pt chart transferred to inpatient.

## 2018-08-21 NOTE — Procedures (Signed)
  Procedure: CT cryoablation RLP renal mass   EBL:   minimal Complications:  none immediate  See full dictation in [facility].  Dillard Cannon MD Main # [phone] Pager  [phone]

## 2018-08-21 NOTE — Anesthesia Procedure Notes (Signed)
Procedure Name: Intubation Date/Time: 08/21/2018 8:44 AM Performed by: Sharlette Dense, CRNA Patient Re-evaluated:Patient Re-evaluated prior to induction Oxygen Delivery Method: Circle system utilized Preoxygenation: Pre-oxygenation with 100% oxygen Induction Type: IV induction Ventilation: Mask ventilation without difficulty Laryngoscope Size: Miller and 2 Grade View: Grade I Tube type: Oral Tube size: 7.0 mm Number of attempts: 1 Airway Equipment and Method: Stylet Placement Confirmation: ETT inserted through vocal cords under direct vision,  positive ETCO2 and breath sounds checked- equal and bilateral Secured at: 20 cm Tube secured with: Tape Dental Injury: Teeth and Oropharynx as per pre-operative assessment

## 2018-08-21 NOTE — Transfer of Care (Signed)
Immediate Anesthesia Transfer of Care Note  Patient: [name]  Procedure(s) Performed: CT WITH ANESTHESIA RENAL CRYOABLATION (N/A )  Patient Location: PACU  Anesthesia Type:General  Level of Consciousness: awake and alert   Airway & Oxygen Therapy: Patient Spontanous Breathing and Patient connected to face mask oxygen  Post-op Assessment: Report given to RN and Post -op Vital signs reviewed and stable  Post vital signs: Reviewed and stable  Last Vitals:  Vitals Value Taken Time  BP 163/74 08/21/18 1115  Temp    Pulse 94 08/21/18 1118  Resp 19 08/21/18 1118  SpO2 100 % 08/21/18 1118  Vitals shown include unvalidated device data.  Last Pain:  Vitals:   08/21/18 0736  PainSc: 0-No pain         Complications: No apparent anesthesia complications

## 2018-08-21 NOTE — H&P (Signed)
Referring Physician(s): [id number],I  Supervising Physician: Arne Cleveland  Patient Status:  [facility] OP TBA  Chief Complaint: Right renal mass   Subjective: Patient familiar to IR service from consultation with Dr. Vernard Gambles on 07/17/2018 to discuss treatment options for right renal mass.  She has a prior history of left nephrectomy for renal cell carcinoma in 2001 as well as solitary left lung metastasis x2 which were resected , latest in 2014.  On subsequent surveillance imaging a new right lower pole enhancing renal mass was noted concerning for renal cell carcinoma.  Following discussions with Dr. Vernard Gambles she was deemed an appropriate candidate for CT-guided biopsy and cryoablation of the right renal mass and presents today for the procedure.  She currently denies fever, headache, chest pain, dyspnea, cough, back pain, nausea, vomiting or bleeding.  She does have some intermittent discomfort at the previous left nephrectomy surgical site.  Additional medical history as below.  Past Medical History:  Diagnosis Date   Allergic state 03/12/2015   Arthritis    Colon polyp 11/19/2014   Dermatitis 03/12/2015   Diarrhea 06/29/2016   DM (diabetes mellitus), type 2 ([place]) 06/29/2016   GERD (gastroesophageal reflux disease)    occ   Gout 03/12/2015   H/O measles    H/O mumps    Headache(784.0)    migraines   History of chicken pox 11/23/2014   Hyperglycemia 06/29/2016   Lung cancer ([place])    Migraine 11/23/2014   Obesity 11/23/2014   Pneumonia    child   Preventative health care 09/12/2015   Renal cell cancer ([place])    renal cell ca dx 9/01 and 8/08;   Shortness of breath    occ   Skin lesion of breast 03/12/2015   Past Surgical History:  Procedure Laterality Date   ABDOMINAL HYSTERECTOMY     APPENDECTOMY     CARDIAC CATHETERIZATION     yrs ago neg   CHOLECYSTECTOMY     IR RADIOLOGIST EVAL & MGMT  07/17/2018   LUNG CANCER SURGERY Left 08   RENAL MASS EXCISION  Left 01   RIGHT/LEFT HEART CATH AND CORONARY ANGIOGRAPHY N/A 07/27/2016   Procedure: Right/Left Heart Cath and Coronary Angiography;  Surgeon: Leonie Man, MD;  Location: [facility];  Service: Cardiovascular;  Laterality: N/A;   THORACOTOMY Left 05/09/2012   Procedure: THORACOTOMY MAJOR;  Surgeon: Gaye Pollack, MD;  Location: [facility];  Service: Thoracic;  Laterality: Left;   VIDEO BRONCHOSCOPY N/A 05/09/2012   Procedure: VIDEO BRONCHOSCOPY;  Surgeon: Gaye Pollack, MD;  Location: [facility];  Service: Thoracic;  Laterality: N/A;   WEDGE RESECTION Left 05/09/2012   Procedure: LEFT UPPER LOBE WEDGE RESECTION;  Surgeon: Gaye Pollack, MD;  Location: [facility];  Service: Thoracic;  Laterality: Left;       Allergies: Patient has no known allergies.  Medications: Prior to Admission medications   Medication Sig Start Date End Date Taking? Authorizing Provider  allopurinol (ZYLOPRIM) 300 MG tablet Take 1 tablet (300 mg total) by mouth daily. 06/13/18  Yes [name], MD  aspirin EC 81 MG tablet Take 81 mg by mouth daily.   Yes [provider]  atorvastatin (LIPITOR) 10 MG tablet Take 1 tablet (10 mg total) by mouth every other day. 06/13/18  Yes [name], MD  Calcium Carb-Cholecalciferol (CALCIUM 600 + D PO) Take 1 tablet by mouth daily.   Yes [provider]  carvedilol (COREG) 6.25 MG tablet Take 1  tablet (6.25 mg total) by mouth 2 (two) times daily. KEEP OV. 07/09/17  Yes Almyra Deforest, PA  diazepam (VALIUM) 5 MG tablet Take 1 tablet 1 hour prior to procedure-repeat if needed 08/05/18  Yes Dean, Tonna Corner, MD  diphenhydramine-acetaminophen (TYLENOL PM) 25-500 MG TABS Take 1 tablet by mouth at bedtime as needed (sleep).    Yes [provider]  furosemide (LASIX) 40 MG tablet TAKE 1 TABLET BY MOUTH EVERY DAY Patient taking differently: Take 40 mg by mouth daily.  10/12/17  Yes Hilty, Nadean Corwin, MD  hyoscyamine (LEVSIN SL) 0.125 MG SL tablet Place 1 tablet  (0.125 mg total) under the tongue every 4 (four) hours as needed. Patient taking differently: Place 0.125 mg under the tongue every 4 (four) hours as needed for cramping.  03/02/16  Yes [name], MD  losartan (COZAAR) 25 MG tablet TAKE 1 TABLET (25 MG TOTAL) BY MOUTH DAILY. OFFICE VISIT NEEDED Patient taking differently: Take 25 mg by mouth daily.  08/01/18  Yes Hilty, Nadean Corwin, MD  meclizine (ANTIVERT) 12.5 MG tablet Take 1 tablet (12.5 mg total) by mouth 3 (three) times daily as needed for dizziness. As needed for   Dizziness or nausea 11/23/14  Yes [name], MD  metFORMIN (GLUCOPHAGE) 500 MG tablet Take 1 tablet (500 mg total) by mouth daily. 06/21/18  Yes [name], MD  nystatin cream (MYCOSTATIN) Apply 1 application topically 2 (two) times daily. 06/07/17  Yes [name], MD  Vitamin D, Ergocalciferol, (DRISDOL) 1.25 MG (50000 UT) CAPS capsule TAKE 1 CAPSULE (50,000 UNITS TOTAL) BY MOUTH EVERY 7 (SEVEN) DAYS. 04/17/18  Yes Roma Schanz R, DO  LORazepam (ATIVAN) 1 MG tablet Take 1 tablet (1 mg total) by mouth every 8 (eight) hours. Patient not taking: Reported on 08/13/2018 01/29/18   Wyatt Portela, MD  Omega-3 Fatty Acids (FISH OIL) 1000 MG CAPS Take 1 capsule (1,000 mg total) by mouth 2 (two) times daily. Patient not taking: Reported on 08/13/2018 08/16/16   Almyra Deforest, PA     Vital Signs: Blood pressure 146/71, heart rate 84, temperature 98.7, respirations 18, O2 sats 97% room air Ht 5\' 4"  (1.626 m)    Wt 214 lb 7 oz (97.3 kg)    BMI 36.81 kg/m   Physical Exam awake, alert.  Chest clear to auscultation bilaterally.  Heart with regular rate and rhythm.  Abdomen obese, soft, positive bowel sounds, slightly tender left lateral abdominal region to palpation.  No significant lower extremity edema.  Imaging: Dg Chest 1 View  Result Date: 08/19/2018 CLINICAL DATA:  Preop for kidney surgery. EXAM: CHEST  1 VIEW COMPARISON:  06/12/2012 and chest CT 11/29/2017 FINDINGS:  Lungs are adequately inflated and otherwise clear. Cardiomediastinal silhouette is within normal. Several surgical clips along the left hilar region. Remainder of the exam is unchanged. IMPRESSION: No acute cardiopulmonary disease. Electronically Signed   By: Marin Olp M.D.   On: 08/19/2018 20:30    Labs:  CBC: Recent Labs    02/11/18 1022 05/24/18 0825 06/18/18 1033 08/19/18 1413  WBC 9.2 7.9 8.9 9.9  HGB 13.0 12.8 13.4 12.6  HCT 39.0 39.6 40.1 40.3  PLT 189.0 193 187.0 212    COAGS: Recent Labs    08/19/18 1413  INR 1.1    BMP: Recent Labs    02/12/18 1055 05/24/18 0825 06/18/18 1033 07/23/18 0926 08/19/18 1413 08/21/18 0736  NA 140 141 139 137 137 138  K 4.4 4.2  4.4 4.8 4.4 4.5  CL 104 107 103 101 101 105  CO2 28 23 29 27 25  21*  GLUCOSE 150* 152* 148* 156* 98 153*  BUN 19 17 17  26* 21 25*  CALCIUM 10.1 9.7 9.7 10.0 9.9 9.8  CREATININE 1.04* 1.10* 0.90 1.06 1.13* 1.00  GFRNONAA 53* 49*  --   --  48* 55*  GFRAA >60 57*  --   --  55* >60    LIVER FUNCTION TESTS: Recent Labs    02/12/18 1055 05/24/18 0825 06/18/18 1033 07/23/18 0926  BILITOT 1.0 0.7 1.0 0.9  AST 73* 61* 72* 49*  ALT 61* 50* 52* 41*  ALKPHOS 68 66 60 57  PROT 7.5 7.5 7.2 6.9  ALBUMIN 3.9 3.7 4.1 4.2    Assessment and Plan: Pt with prior history of left nephrectomy for renal cell carcinoma in 2001 as well as solitary left lung metastasis x2 which were resected , latest in 2014.  On subsequent surveillance imaging a new right lower pole enhancing renal mass was noted concerning for renal cell carcinoma.  Following recent discussions with Dr. Vernard Gambles she was deemed an appropriate candidate for CT-guided biopsy and cryoablation of the right renal mass and presents today for the procedure.  Details/risks of procedure, including but not limited to, internal bleeding, infection, injury to adjacent structures, anesthesia related complications discussed with patient with her understanding and  consent.This procedure involves the use of X-rays and because of the nature of the planned procedure, it is possible that we will have prolonged use of X-ray fluoroscopy/CT.  Potential radiation risks to you include (but are not limited to) the following: - A slightly elevated risk for cancer  several years later in life. This risk is typically less than 0.5% percent. This risk is low in comparison to the normal incidence of human cancer, which is 33% for women and 50% for men according to the [employer]. - Radiation induced injury can include skin redness, resembling a rash, tissue breakdown / ulcers and hair loss (which can be temporary or permanent).   The likelihood of either of these occurring depends on the difficulty of the procedure and whether you are sensitive to radiation due to previous procedures, disease, or genetic conditions.   IF your procedure requires a prolonged use of radiation, you will be notified and given written instructions for further action.  It is your responsibility to monitor the irradiated area for the 2 weeks following the procedure and to notify your physician if you are concerned that you have suffered a radiation induced injury.    Post procedure the patient will be admitted for overnight observation.  Electronically Signed: D. Rowe Robert, PA-C 08/21/2018, 8:02 AM   I spent a total of 30 minutes at the the patient's bedside AND on the patient's hospital floor or unit, greater than 50% of which was counseling/coordinating care for CT-guided biopsy/cryoablation of right renal mass

## 2018-08-21 NOTE — Progress Notes (Signed)
Patient ID: Amanda Crawford, female   DOB: Oct 17, 1945, 73 y.o.   MRN: [id number] Patient currently without significant complaints.  Only complaint is mild throat irritation from recent intubation.  Denies abdominal/flank pain/nausea, vomiting, or resp issues Vital signs stable, afebrile Puncture site right lateral abdominal region clean, dry, nontender, no hematoma.  A/P: Patient with history of prior left nephrectomy for renal cell carcinoma; now with new right renal mass.  Status post CT-guided right renal mass cryoablation earlier today.  For overnight observation.  Check morning labs.  If stable plan DC home in a.m. with follow-up in IR clinic in 3 to 4 weeks.

## 2018-08-22 ENCOUNTER — Encounter (HOSPITAL_COMMUNITY): Payer: Self-pay | Admitting: Interventional Radiology

## 2018-08-22 DIAGNOSIS — C651 Malignant neoplasm of right renal pelvis: Secondary | ICD-10-CM | POA: Diagnosis not present

## 2018-08-22 DIAGNOSIS — N2889 Other specified disorders of kidney and ureter: Secondary | ICD-10-CM | POA: Diagnosis not present

## 2018-08-22 LAB — CBC
HCT: 36.5 % (ref 36.0–46.0)
Hemoglobin: 11.4 g/dL — ABNORMAL LOW (ref 12.0–15.0)
MCH: 31.8 pg (ref 26.0–34.0)
MCHC: 31.2 g/dL (ref 30.0–36.0)
MCV: 101.7 fL — ABNORMAL HIGH (ref 80.0–100.0)
Platelets: 171 10*3/uL (ref 150–400)
RBC: 3.59 MIL/uL — ABNORMAL LOW (ref 3.87–5.11)
RDW: 14.6 % (ref 11.5–15.5)
WBC: 16.2 10*3/uL — ABNORMAL HIGH (ref 4.0–10.5)
nRBC: 0 % (ref 0.0–0.2)

## 2018-08-22 LAB — BASIC METABOLIC PANEL
Anion gap: 12 (ref 5–15)
BUN: 30 mg/dL — ABNORMAL HIGH (ref 8–23)
CO2: 23 mmol/L (ref 22–32)
Calcium: 9.6 mg/dL (ref 8.9–10.3)
Chloride: 103 mmol/L (ref 98–111)
Creatinine, Ser: 1.24 mg/dL — ABNORMAL HIGH (ref 0.44–1.00)
GFR calc Af Amer: 49 mL/min — ABNORMAL LOW (ref >60)
GFR calc non Af Amer: 42 mL/min — ABNORMAL LOW (ref >60)
Glucose, Bld: 161 mg/dL — ABNORMAL HIGH (ref 70–99)
Potassium: 4.8 mmol/L (ref 3.5–5.1)
Sodium: 138 mmol/L (ref 135–145)

## 2018-08-22 NOTE — Discharge Instructions (Signed)
Cryoablation, Care After This sheet gives you information about how to care for yourself after your procedure. Your health care provider may also give you more specific instructions. If you have problems or questions, contact your health care provider. What can I expect after the procedure? After the procedure, it is common to have:  Soreness around the treatment area.  Mild pain and swelling in the treatment area. Follow these instructions at home: Treatment area care   Follow instructions from your health care provider about how to take care of your incision. Make sure you: ? Wash your hands with soap and water before you change your bandage (dressing). If soap and water are not available, use hand sanitizer. ? Change your dressing as told by your health care provider. ? Leave stitches (sutures) in place. They may need to stay in place for 2 weeks or longer.  Check your treatment area every day for signs of infection. Check for: ? More redness, swelling, or pain. ? More fluid or blood. ? Warmth. ? Pus or a bad smell.  Keep the treated area clean, dry, and covered with a dressing until it has healed. Clean the area with soap and water or as told by your health care provider.  You may shower if your health care provider approves. If your bandage gets wet, change it right away. Activity  Follow instructions from your health care provider about any activity limitations.  Do not drive for 24 hours if you received a medicine to help you relax (sedative). General instructions  Take over-the-counter and prescription medicines only as told by your health care provider.  Keep all follow-up visits as told by your health care provider. This is important. Contact a health care provider if:  You do not have a bowel movement for 2 days.  You have nausea or vomiting.  You have more redness, swelling, or pain around your treatment area.  You have more fluid or blood coming from your  treatment area.  Your treatment area feels warm to the touch.  You have pus or a bad smell coming from your treatment area.  You have a fever. Get help right away if:  You have severe pain.  You have trouble swallowing or breathing.  You have severe weakness or dizziness.  You have chest pain or shortness of breath. This information is not intended to replace advice given to you by your health care provider. Make sure you discuss any questions you have with your health care provider. Document Released: 11/06/2012 Document Revised: 12/29/2016 Document Reviewed: 06/16/2015 Elsevier Patient Education  2020 [employer].

## 2018-08-22 NOTE — Discharge Summary (Signed)
Patient ID: Amanda Crawford MRN: [id number] DOB/AGE: Dec 04, 1945 73 y.o.  Admit date: 08/21/2018 Discharge date: 08/22/2018  Supervising Physician: Jacqulynn Cadet  Patient Status: [facility] - In-pt  Admission Diagnoses: Right renal mass Malignant tumor of renal pelvis, right  Discharge Diagnoses:  Active Problems:   Right renal mass   Malignant tumor of renal pelvis, right [facility])   Discharged Condition: stable  Hospital Course:  Patient presented to [facility] 08/21/2018 for an image-guided right renal mass cryoablation with Dr. Vernard Gambles. Procedure occurred without major complications and patient was transferred to floor in stable condition (VSS, right flank stable) for overnight observation. No major events occurred overnight.  Patient awake and alert sitting in chair with no complaints at this time. Denies hematuria. Right flank incisions stable. Plan to discharge home today and follow-up with Dr. Vernard Gambles in clinic approximately 4 weeks after discharge.   Consults: None  Significant Diagnostic Studies: Dg Chest 1 View  Result Date: 08/19/2018 CLINICAL DATA:  Preop for kidney surgery. EXAM: CHEST  1 VIEW COMPARISON:  06/12/2012 and chest CT 11/29/2017 FINDINGS: Lungs are adequately inflated and otherwise clear. Cardiomediastinal silhouette is within normal. Several surgical clips along the left hilar region. Remainder of the exam is unchanged. IMPRESSION: No acute cardiopulmonary disease. Electronically Signed   By: Marin Olp M.D.   On: 08/19/2018 20:30   Ct Guide Tissue Ablation  Result Date: 08/22/2018 INDICATION: Previous left nephrectomy for renal cell carcinoma. Enlarging hypervascular right renal mass. EXAM: CT-GUIDED PERCUTANEOUS CRYOABLATION OF RIGHT LOWER POLE RENAL MASS ANESTHESIA/SEDATION: General MEDICATIONS: Ancef 2 g IV. The antibiotic was administered in an appropriate time interval prior to needle puncture of the skin. CONTRAST:  45mL OMNIPAQUE IOHEXOL 350 MG/ML SOLN  PROCEDURE: The procedure, risks, benefits, and alternatives were explained to the patient. Questions regarding the procedure were encouraged and answered. The patient understands and consents to the procedure. The patient was placed under general anesthesia. Patient was placed left lateral decubitus. Initial unenhanced CT was performed to localize the right renal mass. The patient was prepped with chlorhexidine in a sterile fashion, and a sterile drape was applied covering the operative field. A sterile gown and sterile gloves were used for the procedure. Helical CT after intravenous infusion of 80 mL Omnipaque 350 was performed to better localize the lesion. The size was stable since prior study of 05/24/2018. Rim Under CT guidance, a 17 gauge percutaneous cryoablation probe was advanced into the inferior margin of the lesion. Probe positioning was confirmed by CT prior to cryoablation. Similarly, second 17 gauge percutaneous cryoablation probe was advanced to the superior margin of the lesion. Helical CT with reformats confirms appropriate positioning. There is no proximal bowel to indicate need for hydrodissection. Cryoablation was performed through the 2 probes concurrently. Initial 10 minute cycle of cryoablation was performed. During ablation, periodic CT imaging was performed to monitor ice ball formation and morphology.This was followed by a 8 minute thaw cycle. A second 10 minute cycle of cryoablation was then performed. After active thaw, the cryoablation probes were removed. Post-procedural CT was performed. COMPLICATIONS: None immediate. FINDINGS: The lesion was relatively conspicuous on the noncontrast study. On the contrast-enhanced examination, 2 cm hypervascular lower pole lesion was localized, stable since prior study of 05/24/2018. After cryoablation probe positioning, and initiation of cryoablation, good coverage by the ice ball confirmed. Postprocedure scan shows no significant hemorrhage or  other apparent complication. IMPRESSION: CT guided percutaneous core cryoablation of right lower pole renal mass. The patient will be observed overnight.  Initial follow-up will be performed in approximately 4 weeks. Electronically Signed   By: Lucrezia Europe M.D.   On: 08/22/2018 08:07   Xr Shoulder Right  Result Date: 08/06/2018 AP lateral outlet right shoulder reviewed.  Acromiohumeral distance normal.  AC joint with minimal degenerative changes.  Shoulder is located.  No acute fracture or dislocation.  Visualized lung fields clear.  No destructive bony lesions.  Normal right shoulder   Treatments: Image-guided cryoablation of right renal mass.  Discharge Exam: Blood pressure 138/78, pulse 80, temperature 98.6 F (37 C), temperature source Oral, resp. rate 18, height 5\' 4"  (1.626 m), weight 214 lb 7 oz (97.3 kg), SpO2 97 %. Physical Exam Vitals signs and nursing note reviewed.  Constitutional:      General: She is not in acute distress.    Appearance: Normal appearance.  Cardiovascular:     Rate and Rhythm: Normal rate and regular rhythm.     Heart sounds: Normal heart sounds. No murmur.  Pulmonary:     Effort: Pulmonary effort is normal. No respiratory distress.     Breath sounds: Normal breath sounds. No wheezing.  Abdominal:     Comments: Right flank incisions without tenderness, erythema, drainage, or active bleeding.  Skin:    General: Skin is warm and dry.  Neurological:     Mental Status: She is alert and oriented to person, place, and time.  Psychiatric:        Mood and Affect: Mood normal.        Behavior: Behavior normal.        Thought Content: Thought content normal.        Judgment: Judgment normal.     Disposition: Discharge disposition: 01-Home or Self Care       Discharge Instructions    Call MD for:  difficulty breathing, headache or visual disturbances   Complete by: As directed    Call MD for:  extreme fatigue   Complete by: As directed    Call MD for:   hives   Complete by: As directed    Call MD for:  persistant dizziness or light-headedness   Complete by: As directed    Call MD for:  persistant nausea and vomiting   Complete by: As directed    Call MD for:  redness, tenderness, or signs of infection (pain, swelling, redness, odor or green/yellow discharge around incision site)   Complete by: As directed    Call MD for:  severe uncontrolled pain   Complete by: As directed    Call MD for:  temperature >100.4   Complete by: As directed    Diet - low sodium heart healthy   Complete by: As directed    Discharge instructions   Complete by: As directed    No submerging (bathing, swimming) for 1 week post-procedure.   Increase activity slowly   Complete by: As directed    Remove dressing in 48 hours   Complete by: As directed    Following removal, no need for additional dressings.     Allergies as of 08/22/2018   No Known Allergies     Medication List    TAKE these medications   allopurinol 300 MG tablet Commonly known as: ZYLOPRIM Take 1 tablet (300 mg total) by mouth daily.   aspirin EC 81 MG tablet Take 81 mg by mouth daily.   atorvastatin 10 MG tablet Commonly known as: LIPITOR Take 1 tablet (10 mg total) by mouth every other day.  CALCIUM 600 + D PO Take 1 tablet by mouth daily.   carvedilol 6.25 MG tablet Commonly known as: COREG Take 1 tablet (6.25 mg total) by mouth 2 (two) times daily. KEEP OV.   diazepam 5 MG tablet Commonly known as: VALIUM Take 1 tablet 1 hour prior to procedure-repeat if needed   diphenhydramine-acetaminophen 25-500 MG Tabs tablet Commonly known as: TYLENOL PM Take 1 tablet by mouth at bedtime as needed (sleep).   Fish Oil 1000 MG Caps Take 1 capsule (1,000 mg total) by mouth 2 (two) times daily.   furosemide 40 MG tablet Commonly known as: LASIX TAKE 1 TABLET BY MOUTH EVERY DAY   hyoscyamine 0.125 MG SL tablet Commonly known as: LEVSIN SL Place 1 tablet (0.125 mg total)  under the tongue every 4 (four) hours as needed. What changed: reasons to take this   LORazepam 1 MG tablet Commonly known as: ATIVAN Take 1 tablet (1 mg total) by mouth every 8 (eight) hours.   losartan 25 MG tablet Commonly known as: COZAAR TAKE 1 TABLET (25 MG TOTAL) BY MOUTH DAILY. OFFICE VISIT NEEDED What changed: additional instructions   meclizine 12.5 MG tablet Commonly known as: ANTIVERT Take 1 tablet (12.5 mg total) by mouth 3 (three) times daily as needed for dizziness. As needed for   Dizziness or nausea   metFORMIN 500 MG tablet Commonly known as: GLUCOPHAGE Take 1 tablet (500 mg total) by mouth daily.   nystatin cream Commonly known as: MYCOSTATIN Apply 1 application topically 2 (two) times daily.   Vitamin D (Ergocalciferol) 1.25 MG (50000 UT) Caps capsule Commonly known as: DRISDOL TAKE 1 CAPSULE (50,000 UNITS TOTAL) BY MOUTH EVERY 7 (SEVEN) DAYS.      Follow-up Information    Arne Cleveland, MD Follow up in 1 month(s).   Specialties: Interventional Radiology, Radiology Why: Please follow-up with Dr. Vernard Gambles in clinic approximately 1 month after discharge. Our office will call you to set up this appointment. Contact information: [place] [address] [phone]            Electronically Signed: Earley Abide, PA-C 08/22/2018, 10:02 AM   I have spent Less Than 30 Minutes discharging [name].

## 2018-08-31 DIAGNOSIS — R69 Illness, unspecified: Secondary | ICD-10-CM | POA: Diagnosis not present

## 2018-09-12 ENCOUNTER — Encounter: Payer: Self-pay | Admitting: *Deleted

## 2018-09-12 ENCOUNTER — Other Ambulatory Visit: Payer: Self-pay

## 2018-09-12 ENCOUNTER — Ambulatory Visit
Admission: RE | Admit: 2018-09-12 | Discharge: 2018-09-12 | Disposition: A | Discharge status: Home / Self Care | Payer: Medicare HMO | Source: Ambulatory Visit | Attending: Student | Admitting: Student

## 2018-09-12 DIAGNOSIS — N2889 Other specified disorders of kidney and ureter: Secondary | ICD-10-CM | POA: Diagnosis not present

## 2018-09-12 HISTORY — PX: IR RADIOLOGIST EVAL & MGMT: IMG5224

## 2018-09-12 NOTE — Progress Notes (Signed)
Patient ID: Amanda Crawford, female   DOB: July 02, 1945, 73 y.o.   MRN: [id number]       Chief Complaint: Patient was consulted remotely today (TeleHealth) for post renal cryoablation at the request of Louk,Alexandra M.    Referring Physician(s): Shadad,Firas N  History of Present Illness: Amanda Crawford is a 73 y.o. female  with a history of 2001 left nephrectomy for renal cell carcinoma   by [name], with concurrent cholecystectomy by [name].  2014  Solitary lung metastases x2 were resected from the left lung by Drs.[name] and [place],  On subsequent surveillance imaging, a new right lower pole enhancing renal mass was identified concerning for renal cell carcinoma.  No regional adenopathy.  No evidence of renal vein involvement. 05/24/2018 CT demonstrates slight increase in size of 1.4 cm hypervascular right renal mass  08/21/2018 technically successful CT-guided cryoablation of renal lesion without immediate complication.  Patient was discharged home the same day.  She is done well since discharge home.  No significant pain at the ablation site around the abdomen.  No hematuria.    Past Medical History:  Diagnosis Date   Allergic state 03/12/2015   Arthritis    Colon polyp 11/19/2014   Dermatitis 03/12/2015   Diarrhea 06/29/2016   DM (diabetes mellitus), type 2 ([place]) 06/29/2016   GERD (gastroesophageal reflux disease)    occ   Gout 03/12/2015   H/O measles    H/O mumps    Headache(784.0)    migraines   History of chicken pox 11/23/2014   Hyperglycemia 06/29/2016   Lung cancer ([place])    Migraine 11/23/2014   Obesity 11/23/2014   Pneumonia    child   Preventative health care 09/12/2015   Renal cell cancer ([place])    renal cell ca dx 9/01 and 8/08;   Shortness of breath    occ   Skin lesion of breast 03/12/2015    Past Surgical History:  Procedure Laterality Date   ABDOMINAL HYSTERECTOMY     APPENDECTOMY     CARDIAC CATHETERIZATION     yrs ago  neg   CHOLECYSTECTOMY     IR RADIOLOGIST EVAL & MGMT  07/17/2018   IR RADIOLOGIST EVAL & MGMT  09/12/2018   LUNG CANCER SURGERY Left 08   RADIOLOGY WITH ANESTHESIA N/A 08/21/2018   Procedure: CT WITH ANESTHESIA RENAL CRYOABLATION;  Surgeon: Arne Cleveland, MD;  Location: [facility];  Service: Radiology;  Laterality: N/A;   RENAL MASS EXCISION Left 01   RIGHT/LEFT HEART CATH AND CORONARY ANGIOGRAPHY N/A 07/27/2016   Procedure: Right/Left Heart Cath and Coronary Angiography;  Surgeon: Leonie Man, MD;  Location: [facility];  Service: Cardiovascular;  Laterality: N/A;   THORACOTOMY Left 05/09/2012   Procedure: THORACOTOMY MAJOR;  Surgeon: Gaye Pollack, MD;  Location: [facility];  Service: Thoracic;  Laterality: Left;   VIDEO BRONCHOSCOPY N/A 05/09/2012   Procedure: VIDEO BRONCHOSCOPY;  Surgeon: Gaye Pollack, MD;  Location: [facility];  Service: Thoracic;  Laterality: N/A;   WEDGE RESECTION Left 05/09/2012   Procedure: LEFT UPPER LOBE WEDGE RESECTION;  Surgeon: Gaye Pollack, MD;  Location: [facility];  Service: Thoracic;  Laterality: Left;    Allergies: Patient has no known allergies.  Medications: Prior to Admission medications   Medication Sig Start Date End Date Taking? Authorizing Provider  allopurinol (ZYLOPRIM) 300 MG tablet Take 1 tablet (300 mg total) by mouth daily. 06/13/18   [name], MD  aspirin EC 81  MG tablet Take 81 mg by mouth daily.    [provider]  atorvastatin (LIPITOR) 10 MG tablet Take 1 tablet (10 mg total) by mouth every other day. 06/13/18   [name], MD  Calcium Carb-Cholecalciferol (CALCIUM 600 + D PO) Take 1 tablet by mouth daily.    [provider]  carvedilol (COREG) 6.25 MG tablet Take 1 tablet (6.25 mg total) by mouth 2 (two) times daily. KEEP OV. 07/09/17   Almyra Deforest, PA  diazepam (VALIUM) 5 MG tablet Take 1 tablet 1 hour prior to procedure-repeat if needed 08/05/18   Meredith Pel, MD  diphenhydramine-acetaminophen  (TYLENOL PM) 25-500 MG TABS Take 1 tablet by mouth at bedtime as needed (sleep).     [provider]  furosemide (LASIX) 40 MG tablet TAKE 1 TABLET BY MOUTH EVERY DAY Patient taking differently: Take 40 mg by mouth daily.  10/12/17   Hilty, Nadean Corwin, MD  hyoscyamine (LEVSIN SL) 0.125 MG SL tablet Place 1 tablet (0.125 mg total) under the tongue every 4 (four) hours as needed. Patient taking differently: Place 0.125 mg under the tongue every 4 (four) hours as needed for cramping.  03/02/16   [name], MD  LORazepam (ATIVAN) 1 MG tablet Take 1 tablet (1 mg total) by mouth every 8 (eight) hours. Patient not taking: Reported on 08/13/2018 01/29/18   Wyatt Portela, MD  losartan (COZAAR) 25 MG tablet TAKE 1 TABLET (25 MG TOTAL) BY MOUTH DAILY. OFFICE VISIT NEEDED Patient taking differently: Take 25 mg by mouth daily.  08/01/18   Hilty, Nadean Corwin, MD  meclizine (ANTIVERT) 12.5 MG tablet Take 1 tablet (12.5 mg total) by mouth 3 (three) times daily as needed for dizziness. As needed for   Dizziness or nausea 11/23/14   [name], MD  metFORMIN (GLUCOPHAGE) 500 MG tablet Take 1 tablet (500 mg total) by mouth daily. 06/21/18   [name], MD  nystatin cream (MYCOSTATIN) Apply 1 application topically 2 (two) times daily. 06/07/17   [name], MD  Omega-3 Fatty Acids (FISH OIL) 1000 MG CAPS Take 1 capsule (1,000 mg total) by mouth 2 (two) times daily. Patient not taking: Reported on 08/13/2018 08/16/16   Almyra Deforest, PA  Vitamin D, Ergocalciferol, (DRISDOL) 1.25 MG (50000 UT) CAPS capsule TAKE 1 CAPSULE (50,000 UNITS TOTAL) BY MOUTH EVERY 7 (SEVEN) DAYS. 04/17/18   Ann Held, DO     Family History  Problem Relation Age of Onset   Heart failure Father    Diabetes Father    Hyperlipidemia Father    Heart disease Father    Hypertension Father    Asthma Brother    Asthma Daughter    Cancer Paternal Grandmother    Hearing loss Paternal Grandfather    Stroke  Paternal Grandfather    Alcohol abuse Maternal Aunt     Social History   Socioeconomic History   Marital status: Married    Spouse name: Not on file   Number of children: Not on file   Years of education: Not on file   Highest education level: Not on file  Occupational History   Not on file  Social Needs   Financial resource strain: Not on file   Food insecurity    Worry: Not on file    Inability: Not on file   Transportation needs    Medical: Not on file    Non-medical: Not on file  Tobacco Use  Smoking status: Former Smoker    Packs/day: 0.50    Years: 35.00    Pack years: 17.50    Types: Cigarettes    Quit date: 05/08/2006    Years since quitting: 12.3   Smokeless tobacco: Never Used  Substance and Sexual Activity   Alcohol use: No   Drug use: No   Sexual activity: Not on file    Comment: lives with husband, no dietary restrictions.   Lifestyle   Physical activity    Days per week: Not on file    Minutes per session: Not on file   Stress: Not on file  Relationships   Social connections    Talks on phone: Not on file    Gets together: Not on file    Attends religious service: Not on file    Active member of club or organization: Not on file    Attends meetings of clubs or organizations: Not on file    Relationship status: Not on file  Other Topics Concern   Not on file  Social History Narrative   Not on file    ECOG Status: 0 - Asymptomatic  Review of Systems  Review of Systems: A 12 point ROS discussed and pertinent positives are indicated in the HPI above.  All other systems are negative.  Physical Exam No direct physical exam was performed (except for noted visual exam findings with Video Visits).     Vital Signs: There were no vitals taken for this visit.  Imaging: Dg Chest 1 View  Result Date: 08/19/2018 CLINICAL DATA:  Preop for kidney surgery. EXAM: CHEST  1 VIEW COMPARISON:  06/12/2012 and chest CT 11/29/2017 FINDINGS:  Lungs are adequately inflated and otherwise clear. Cardiomediastinal silhouette is within normal. Several surgical clips along the left hilar region. Remainder of the exam is unchanged. IMPRESSION: No acute cardiopulmonary disease. Electronically Signed   By: Marin Olp M.D.   On: 08/19/2018 20:30   Ct Guide Tissue Ablation  Result Date: 08/22/2018 INDICATION: Previous left nephrectomy for renal cell carcinoma. Enlarging hypervascular right renal mass. EXAM: CT-GUIDED PERCUTANEOUS CRYOABLATION OF RIGHT LOWER POLE RENAL MASS ANESTHESIA/SEDATION: General MEDICATIONS: Ancef 2 g IV. The antibiotic was administered in an appropriate time interval prior to needle puncture of the skin. CONTRAST:  12mL OMNIPAQUE IOHEXOL 350 MG/ML SOLN PROCEDURE: The procedure, risks, benefits, and alternatives were explained to the patient. Questions regarding the procedure were encouraged and answered. The patient understands and consents to the procedure. The patient was placed under general anesthesia. Patient was placed left lateral decubitus. Initial unenhanced CT was performed to localize the right renal mass. The patient was prepped with chlorhexidine in a sterile fashion, and a sterile drape was applied covering the operative field. A sterile gown and sterile gloves were used for the procedure. Helical CT after intravenous infusion of 80 mL Omnipaque 350 was performed to better localize the lesion. The size was stable since prior study of 05/24/2018. Rim Under CT guidance, a 17 gauge percutaneous cryoablation probe was advanced into the inferior margin of the lesion. Probe positioning was confirmed by CT prior to cryoablation. Similarly, second 17 gauge percutaneous cryoablation probe was advanced to the superior margin of the lesion. Helical CT with reformats confirms appropriate positioning. There is no proximal bowel to indicate need for hydrodissection. Cryoablation was performed through the 2 probes concurrently. Initial  10 minute cycle of cryoablation was performed. During ablation, periodic CT imaging was performed to monitor ice ball formation and morphology.This was followed  by a 8 minute thaw cycle. A second 10 minute cycle of cryoablation was then performed. After active thaw, the cryoablation probes were removed. Post-procedural CT was performed. COMPLICATIONS: None immediate. FINDINGS: The lesion was relatively conspicuous on the noncontrast study. On the contrast-enhanced examination, 2 cm hypervascular lower pole lesion was localized, stable since prior study of 05/24/2018. After cryoablation probe positioning, and initiation of cryoablation, good coverage by the ice ball confirmed. Postprocedure scan shows no significant hemorrhage or other apparent complication. IMPRESSION: CT guided percutaneous core cryoablation of right lower pole renal mass. The patient will be observed overnight. Initial follow-up will be performed in approximately 4 weeks. Electronically Signed   By: Lucrezia Europe M.D.   On: 08/22/2018 08:07   Ir Radiologist Eval & Mgmt  Result Date: 09/12/2018 Please refer to notes tab for details about interventional procedure. (Op Note)   Labs:  CBC: Recent Labs    05/24/18 0825 06/18/18 1033 08/19/18 1413 08/22/18 0415  WBC 7.9 8.9 9.9 16.2*  HGB 12.8 13.4 12.6 11.4*  HCT 39.6 40.1 40.3 36.5  PLT 193 187.0 212 171    COAGS: Recent Labs    08/19/18 1413  INR 1.1    BMP: Recent Labs    05/24/18 0825  07/23/18 0926 08/19/18 1413 08/21/18 0736 08/22/18 0415  NA 141   < > 137 137 138 138  K 4.2   < > 4.8 4.4 4.5 4.8  CL 107   < > 101 101 105 103  CO2 23   < > 27 25 21* 23  GLUCOSE 152*   < > 156* 98 153* 161*  BUN 17   < > 26* 21 25* 30*  CALCIUM 9.7   < > 10.0 9.9 9.8 9.6  CREATININE 1.10*   < > 1.06 1.13* 1.00 1.24*  GFRNONAA 49*  --   --  48* 55* 42*  GFRAA 57*  --   --  55* >60 49*   < > = values in this interval not displayed.    LIVER FUNCTION TESTS: Recent Labs      02/12/18 1055 05/24/18 0825 06/18/18 1033 07/23/18 0926  BILITOT 1.0 0.7 1.0 0.9  AST 73* 61* 72* 49*  ALT 61* 50* 52* 41*  ALKPHOS 68 66 60 57  PROT 7.5 7.5 7.2 6.9  ALBUMIN 3.9 3.7 4.1 4.2    TUMOR MARKERS: No results for input(s): AFPTM, CEA, CA199, CHROMGRNA in the last 8760 hours.  Assessment and Plan:  My impression is that Amanda Crawford has done well  in the 1 month since percutaneous CT-guided cryoablation of the right renal mass.  The small size of the lesion precluded pretreatment biopsy. She was  satisfied with her post procedure course.  No significant questions or concerns.   We  discussed the expected involution of the treatment site over the next few months.  We will plan to get a follow-up CT  at about 3 months post procedure.  I will review those findings with her after I see the study.  She knows to call in the interval if she has any questions or problems.  Thank you for this interesting consult.  I greatly enjoyed meeting Amanda Crawford and look forward to participating in their care.  A copy of this report was sent to the requesting provider on this date.  Electronically Signed: Rickard Rhymes 09/12/2018, 2:08 PM   I spent a total of    25 Minutes in remote  clinical consultation,  greater than 50% of which was counseling/coordinating care for right renal mass, post cryoablation.    Visit type: Audio only (telephone). Audio (no video) only due to patient's lack of internet/smartphone capability. Alternative for in-person consultation at [facility], [address], [place], [place]. This visit type was conducted due to national recommendations for restrictions regarding the COVID-19 Pandemic (e.g. social distancing).  This format is felt to be most appropriate for this patient at this time.  All issues noted in this document were discussed and addressed.

## 2018-09-16 ENCOUNTER — Other Ambulatory Visit: Payer: Self-pay | Admitting: Family Medicine

## 2018-09-17 ENCOUNTER — Ambulatory Visit (INDEPENDENT_AMBULATORY_CARE_PROVIDER_SITE_OTHER): Payer: Medicare HMO | Admitting: Family Medicine

## 2018-09-17 ENCOUNTER — Other Ambulatory Visit: Payer: Self-pay

## 2018-09-17 ENCOUNTER — Encounter: Payer: Self-pay | Admitting: Family Medicine

## 2018-09-17 VITALS — BP 102/62 | HR 78 | Temp 96.8°F | Resp 18 | Wt 213.8 lb

## 2018-09-17 DIAGNOSIS — E119 Type 2 diabetes mellitus without complications: Secondary | ICD-10-CM

## 2018-09-17 DIAGNOSIS — R7989 Other specified abnormal findings of blood chemistry: Secondary | ICD-10-CM

## 2018-09-17 DIAGNOSIS — N289 Disorder of kidney and ureter, unspecified: Secondary | ICD-10-CM | POA: Diagnosis not present

## 2018-09-17 DIAGNOSIS — C651 Malignant neoplasm of right renal pelvis: Secondary | ICD-10-CM

## 2018-09-17 DIAGNOSIS — C649 Malignant neoplasm of unspecified kidney, except renal pelvis: Secondary | ICD-10-CM

## 2018-09-17 DIAGNOSIS — E785 Hyperlipidemia, unspecified: Secondary | ICD-10-CM | POA: Diagnosis not present

## 2018-09-17 LAB — COMPREHENSIVE METABOLIC PANEL
ALT: 35 U/L (ref 0–35)
AST: 44 U/L — ABNORMAL HIGH (ref 0–37)
Albumin: 4.2 g/dL (ref 3.5–5.2)
Alkaline Phosphatase: 56 U/L (ref 39–117)
BUN: 36 mg/dL — ABNORMAL HIGH (ref 6–23)
CO2: 26 mEq/L (ref 19–32)
Calcium: 10.6 mg/dL — ABNORMAL HIGH (ref 8.4–10.5)
Chloride: 102 mEq/L (ref 96–112)
Creatinine, Ser: 1.47 mg/dL — ABNORMAL HIGH (ref 0.40–1.20)
GFR: 34.62 mL/min — ABNORMAL LOW (ref >60.00)
Glucose, Bld: 120 mg/dL — ABNORMAL HIGH (ref 70–99)
Potassium: 4.7 mEq/L (ref 3.5–5.1)
Sodium: 137 mEq/L (ref 135–145)
Total Bilirubin: 0.8 mg/dL (ref 0.2–1.2)
Total Protein: 7.2 g/dL (ref 6.0–8.3)

## 2018-09-17 LAB — CBC
HCT: 37.5 % (ref 36.0–46.0)
Hemoglobin: 12.3 g/dL (ref 12.0–15.0)
MCHC: 32.9 g/dL (ref 30.0–36.0)
MCV: 99.8 fl (ref 78.0–100.0)
Platelets: 214 10*3/uL (ref 150.0–400.0)
RBC: 3.76 Mil/uL — ABNORMAL LOW (ref 3.87–5.11)
RDW: 15.3 % (ref 11.5–15.5)
WBC: 7.7 10*3/uL (ref 4.0–10.5)

## 2018-09-17 LAB — LIPID PANEL
Cholesterol: 220 mg/dL — ABNORMAL HIGH (ref 0–200)
HDL: 45.1 mg/dL (ref >39.00)
NonHDL: 175.32
Total CHOL/HDL Ratio: 5
Triglycerides: 388 mg/dL — ABNORMAL HIGH (ref 0.0–149.0)
VLDL: 77.6 mg/dL — ABNORMAL HIGH (ref 0.0–40.0)

## 2018-09-17 LAB — VITAMIN D 25 HYDROXY (VIT D DEFICIENCY, FRACTURES): VITD: 48.6 ng/mL (ref 30.00–100.00)

## 2018-09-17 LAB — HEMOGLOBIN A1C: Hgb A1c MFr Bld: 7 % — ABNORMAL HIGH (ref 4.6–6.5)

## 2018-09-17 LAB — LDL CHOLESTEROL, DIRECT: Direct LDL: 97 mg/dL

## 2018-09-17 LAB — TSH: TSH: 1.5 u[IU]/mL (ref 0.35–4.50)

## 2018-09-17 NOTE — Assessment & Plan Note (Signed)
Supplement and monitor 

## 2018-09-17 NOTE — Assessment & Plan Note (Signed)
Tolerated cryoablation of right kidney last month. No trouble with recovery. Following with urology and interventional radiology did her ablation.

## 2018-09-17 NOTE — Patient Instructions (Signed)
Carbohydrate Counting for Diabetes Mellitus, Adult  Carbohydrate counting is a method of keeping track of how many carbohydrates you eat. Eating carbohydrates naturally increases the amount of sugar (glucose) in the blood. Counting how many carbohydrates you eat helps keep your blood glucose within normal limits, which helps you manage your diabetes (diabetes mellitus). It is important to know how many carbohydrates you can safely have in each meal. This is different for every person. A diet and nutrition specialist (registered dietitian) can help you make a meal plan and calculate how many carbohydrates you should have at each meal and snack. Carbohydrates are found in the following foods:  Grains, such as breads and cereals.  Dried beans and soy products.  Starchy vegetables, such as potatoes, peas, and corn.  Fruit and fruit juices.  Milk and yogurt.  Sweets and snack foods, such as cake, cookies, candy, chips, and soft drinks. How do I count carbohydrates? There are two ways to count carbohydrates in food. You can use either of the methods or a combination of both. Reading "Nutrition Facts" on packaged food The "Nutrition Facts" list is included on the labels of almost all packaged foods and beverages in the [place] It includes:  The serving size.  Information about nutrients in each serving, including the grams (g) of carbohydrate per serving. To use the "Nutrition Facts":  Decide how many servings you will have.  Multiply the number of servings by the number of carbohydrates per serving.  The resulting number is the total amount of carbohydrates that you will be having. Learning standard serving sizes of other foods When you eat carbohydrate foods that are not packaged or do not include "Nutrition Facts" on the label, you need to measure the servings in order to count the amount of carbohydrates:  Measure the foods that you will eat with a food scale or measuring cup, if needed.   Decide how many standard-size servings you will eat.  Multiply the number of servings by 15. Most carbohydrate-rich foods have about 15 g of carbohydrates per serving. ? For example, if you eat 8 oz (170 g) of strawberries, you will have eaten 2 servings and 30 g of carbohydrates (2 servings x 15 g = 30 g).  For foods that have more than one food mixed, such as soups and casseroles, you must count the carbohydrates in each food that is included. The following list contains standard serving sizes of common carbohydrate-rich foods. Each of these servings has about 15 g of carbohydrates:   hamburger bun or  English muffin.   oz (15 mL) syrup.   oz (14 g) jelly.  1 slice of bread.  1 six-inch tortilla.  3 oz (85 g) cooked rice or pasta.  4 oz (113 g) cooked dried beans.  4 oz (113 g) starchy vegetable, such as peas, corn, or potatoes.  4 oz (113 g) hot cereal.  4 oz (113 g) mashed potatoes or  of a large baked potato.  4 oz (113 g) canned or frozen fruit.  4 oz (120 mL) fruit juice.  4-6 crackers.  6 chicken nuggets.  6 oz (170 g) unsweetened dry cereal.  6 oz (170 g) plain fat-free yogurt or yogurt sweetened with artificial sweeteners.  8 oz (240 mL) milk.  8 oz (170 g) fresh fruit or one small piece of fruit.  24 oz (680 g) popped popcorn. Example of carbohydrate counting Sample meal  3 oz (85 g) chicken breast.  6 oz (170 g)   brown rice.  4 oz (113 g) corn.  8 oz (240 mL) milk.  8 oz (170 g) strawberries with sugar-free whipped topping. Carbohydrate calculation 1. Identify the foods that contain carbohydrates: ? Rice. ? Corn. ? Milk. ? Strawberries. 2. Calculate how many servings you have of each food: ? 2 servings rice. ? 1 serving corn. ? 1 serving milk. ? 1 serving strawberries. 3. Multiply each number of servings by 15 g: ? 2 servings rice x 15 g = 30 g. ? 1 serving corn x 15 g = 15 g. ? 1 serving milk x 15 g = 15 g. ? 1 serving  strawberries x 15 g = 15 g. 4. Add together all of the amounts to find the total grams of carbohydrates eaten: ? 30 g + 15 g + 15 g + 15 g = 75 g of carbohydrates total. Summary  Carbohydrate counting is a method of keeping track of how many carbohydrates you eat.  Eating carbohydrates naturally increases the amount of sugar (glucose) in the blood.  Counting how many carbohydrates you eat helps keep your blood glucose within normal limits, which helps you manage your diabetes.  A diet and nutrition specialist (registered dietitian) can help you make a meal plan and calculate how many carbohydrates you should have at each meal and snack. This information is not intended to replace advice given to you by your health care provider. Make sure you discuss any questions you have with your health care provider. Document Released: 01/16/2005 Document Revised: 08/10/2016 Document Reviewed: 06/30/2015 Elsevier Patient Education  2020 [employer].  

## 2018-09-17 NOTE — Assessment & Plan Note (Signed)
Encouraged heart healthy diet, increase exercise, avoid trans fats, consider a krill oil cap daily 

## 2018-09-17 NOTE — Assessment & Plan Note (Signed)
hgba1c acceptable, minimize simple carbs. Increase exercise as tolerated. Continue current meds 

## 2018-09-22 DIAGNOSIS — N289 Disorder of kidney and ureter, unspecified: Secondary | ICD-10-CM | POA: Insufficient documentation

## 2018-09-22 NOTE — Assessment & Plan Note (Signed)
She recently under went cryotherapy for right renal mass. She has recovered well.

## 2018-09-22 NOTE — Assessment & Plan Note (Signed)
Hydrate and monitor. Follow up with urology and nephrology

## 2018-09-22 NOTE — Progress Notes (Signed)
Subjective:    Patient ID: Amanda Crawford, female    DOB: 03-05-1945, 73 y.o.   MRN: [id number]  No chief complaint on file.   HPI Patient is in today for follow up on chronic medical concerns including renal mass, hyperlipidemia and vitamin d deficiency. She just underwent cryoablation of her right renal mass and tolerated the procedure well. No fevers or chills. No recent other concerns. She is eating well and hydrating well. Denies CP/palp/SOB/HA/congestion/fevers/GI or GU c/o. Taking meds as prescribed  Past Medical History:  Diagnosis Date  . Allergic state 03/12/2015  . Arthritis   . Colon polyp 11/19/2014  . Dermatitis 03/12/2015  . Diarrhea 06/29/2016  . DM (diabetes mellitus), type 2 ([place]) 06/29/2016  . GERD (gastroesophageal reflux disease)    occ  . Gout 03/12/2015  . H/O measles   . H/O mumps   . Headache(784.0)    migraines  . History of chicken pox 11/23/2014  . Hyperglycemia 06/29/2016  . Lung cancer ([place])   . Migraine 11/23/2014  . Obesity 11/23/2014  . Pneumonia    child  . Preventative health care 09/12/2015  . Renal cell cancer ([place])    renal cell ca dx 9/01 and 8/08;  . Shortness of breath    occ  . Skin lesion of breast 03/12/2015    Past Surgical History:  Procedure Laterality Date  . ABDOMINAL HYSTERECTOMY    . APPENDECTOMY    . CARDIAC CATHETERIZATION     yrs ago neg  . CHOLECYSTECTOMY    . IR RADIOLOGIST EVAL & MGMT  07/17/2018  . IR RADIOLOGIST EVAL & MGMT  09/12/2018  . LUNG CANCER SURGERY Left 08  . RADIOLOGY WITH ANESTHESIA N/A 08/21/2018   Procedure: CT WITH ANESTHESIA RENAL CRYOABLATION;  Surgeon: Arne Cleveland, MD;  Location: [facility];  Service: Radiology;  Laterality: N/A;  . RENAL MASS EXCISION Left 01  . RIGHT/LEFT HEART CATH AND CORONARY ANGIOGRAPHY N/A 07/27/2016   Procedure: Right/Left Heart Cath and Coronary Angiography;  Surgeon: Leonie Man, MD;  Location: [place] [facility];  Service: Cardiovascular;  Laterality: N/A;  .  THORACOTOMY Left 05/09/2012   Procedure: THORACOTOMY MAJOR;  Surgeon: Gaye Pollack, MD;  Location: [place];  Service: Thoracic;  Laterality: Left;  Marland Kitchen VIDEO BRONCHOSCOPY N/A 05/09/2012   Procedure: VIDEO BRONCHOSCOPY;  Surgeon: Gaye Pollack, MD;  Location: [facility];  Service: Thoracic;  Laterality: N/A;  . WEDGE RESECTION Left 05/09/2012   Procedure: LEFT UPPER LOBE WEDGE RESECTION;  Surgeon: Gaye Pollack, MD;  Location: [facility];  Service: Thoracic;  Laterality: Left;    Family History  Problem Relation Age of Onset  . Heart failure Father   . Diabetes Father   . Hyperlipidemia Father   . Heart disease Father   . Hypertension Father   . Asthma Brother   . Asthma Daughter   . Cancer Paternal Grandmother   . Hearing loss Paternal Grandfather   . Stroke Paternal Grandfather   . Alcohol abuse Maternal Aunt     Social History   Socioeconomic History  . Marital status: Married    Spouse name: Not on file  . Number of children: Not on file  . Years of education: Not on file  . Highest education level: Not on file  Occupational History  . Not on file  Social Needs  . Financial resource strain: Not on file  . Food insecurity    Worry: Not on file  Inability: Not on file  . Transportation needs    Medical: Not on file    Non-medical: Not on file  Tobacco Use  . Smoking status: Former Smoker    Packs/day: 0.50    Years: 35.00    Pack years: 17.50    Types: Cigarettes    Quit date: 05/08/2006    Years since quitting: 12.3  . Smokeless tobacco: Never Used  Substance and Sexual Activity  . Alcohol use: No  . Drug use: No  . Sexual activity: Not on file    Comment: lives with husband, no dietary restrictions.   Lifestyle  . Physical activity    Days per week: Not on file    Minutes per session: Not on file  . Stress: Not on file  Relationships  . Social Herbalist on phone: Not on file    Gets together: Not on file    Attends religious service: Not on file     Active member of club or organization: Not on file    Attends meetings of clubs or organizations: Not on file    Relationship status: Not on file  . Intimate partner violence    Fear of current or ex partner: Not on file    Emotionally abused: Not on file    Physically abused: Not on file    Forced sexual activity: Not on file  Other Topics Concern  . Not on file  Social History Narrative  . Not on file    Outpatient Medications Prior to Visit  Medication Sig Dispense Refill  . allopurinol (ZYLOPRIM) 300 MG tablet Take 1 tablet (300 mg total) by mouth daily. 90 tablet 1  . aspirin EC 81 MG tablet Take 81 mg by mouth daily.    Marland Kitchen atorvastatin (LIPITOR) 10 MG tablet Take 1 tablet (10 mg total) by mouth every other day. 45 tablet 1  . Calcium Carb-Cholecalciferol (CALCIUM 600 + D PO) Take 1 tablet by mouth daily.    . carvedilol (COREG) 6.25 MG tablet Take 1 tablet (6.25 mg total) by mouth 2 (two) times daily. KEEP OV. 180 tablet 0  . diphenhydramine-acetaminophen (TYLENOL PM) 25-500 MG TABS Take 1 tablet by mouth at bedtime as needed (sleep).     . furosemide (LASIX) 40 MG tablet TAKE 1 TABLET BY MOUTH EVERY DAY (Patient taking differently: Take 40 mg by mouth daily. ) 90 tablet 3  . hyoscyamine (LEVSIN SL) 0.125 MG SL tablet Place 1 tablet (0.125 mg total) under the tongue every 4 (four) hours as needed. (Patient taking differently: Place 0.125 mg under the tongue every 4 (four) hours as needed for cramping. ) 30 tablet 1  . losartan (COZAAR) 25 MG tablet TAKE 1 TABLET (25 MG TOTAL) BY MOUTH DAILY. OFFICE VISIT NEEDED (Patient taking differently: Take 25 mg by mouth daily. ) 90 tablet 1  . meclizine (ANTIVERT) 12.5 MG tablet Take 1 tablet (12.5 mg total) by mouth 3 (three) times daily as needed for dizziness. As needed for   Dizziness or nausea 30 tablet 1  . nystatin cream (MYCOSTATIN) Apply 1 application topically 2 (two) times daily. 30 g 1  . Vitamin D, Ergocalciferol, (DRISDOL) 1.25 MG  (50000 UT) CAPS capsule TAKE 1 CAPSULE (50,000 UNITS TOTAL) BY MOUTH EVERY 7 (SEVEN) DAYS. 12 capsule 1  . metFORMIN (GLUCOPHAGE) 500 MG tablet Take 1 tablet (500 mg total) by mouth daily. 30 tablet 3  . diazepam (VALIUM) 5 MG tablet Take 1 tablet  1 hour prior to procedure-repeat if needed (Patient not taking: Reported on 09/17/2018) 3 tablet 0  . LORazepam (ATIVAN) 1 MG tablet Take 1 tablet (1 mg total) by mouth every 8 (eight) hours. (Patient not taking: Reported on 08/13/2018) 6 tablet 0  . Omega-3 Fatty Acids (FISH OIL) 1000 MG CAPS Take 1 capsule (1,000 mg total) by mouth 2 (two) times daily. (Patient not taking: Reported on 08/13/2018)  0   No facility-administered medications prior to visit.     No Known Allergies  Review of Systems  Constitutional: Positive for malaise/fatigue. Negative for fever.  HENT: Negative for congestion.   Eyes: Negative for blurred vision.  Respiratory: Negative for shortness of breath.   Cardiovascular: Negative for chest pain, palpitations and leg swelling.  Gastrointestinal: Negative for abdominal pain, blood in stool and nausea.  Genitourinary: Negative for dysuria and frequency.  Musculoskeletal: Negative for falls.  Skin: Negative for rash.  Neurological: Negative for dizziness, loss of consciousness and headaches.  Endo/Heme/Allergies: Negative for environmental allergies.  Psychiatric/Behavioral: Negative for depression. The patient is not nervous/anxious.        Objective:    Physical Exam Vitals signs and nursing note reviewed.  Constitutional:      General: She is not in acute distress.    Appearance: She is well-developed.  HENT:     Head: Normocephalic and atraumatic.     Nose: Nose normal.  Eyes:     General:        Right eye: No discharge.        Left eye: No discharge.  Neck:     Musculoskeletal: Normal range of motion and neck supple.  Cardiovascular:     Rate and Rhythm: Normal rate and regular rhythm.     Heart sounds: No  murmur.  Pulmonary:     Effort: Pulmonary effort is normal.     Breath sounds: Normal breath sounds.  Abdominal:     General: Bowel sounds are normal.     Palpations: Abdomen is soft.     Tenderness: There is no abdominal tenderness.  Skin:    General: Skin is warm and dry.  Neurological:     Mental Status: She is alert and oriented to person, place, and time.     BP 102/62 (BP Location: Left Arm, Patient Position: Sitting, Cuff Size: Normal)   Pulse 78   Temp (!) 96.8 F (36 C) (Oral)   Resp 18   Wt 213 lb 12.8 oz (97 kg)   SpO2 98%   BMI 36.70 kg/m  Wt Readings from Last 3 Encounters:  09/17/18 213 lb 12.8 oz (97 kg)  08/21/18 214 lb 7 oz (97.3 kg)  08/19/18 214 lb 7 oz (97.3 kg)    Diabetic Foot Exam - Simple   No data filed     Lab Results  Component Value Date   WBC 7.7 09/17/2018   HGB 12.3 09/17/2018   HCT 37.5 09/17/2018   PLT 214.0 09/17/2018   GLUCOSE 120 (H) 09/17/2018   CHOL 220 (H) 09/17/2018   TRIG 388.0 (H) 09/17/2018   HDL 45.10 09/17/2018   LDLDIRECT 97.0 09/17/2018   LDLCALC (H) 01/31/2007    117        Total Cholesterol/HDL:CHD Risk Coronary Heart Disease Risk Table                     Men   Women  1/2 Average Risk   3.4   3.3  Average Risk  5.0   4.4  2 X Average Risk   9.6   7.1  3 X Average Risk  23.4   11.0        Use the calculated Patient Ratio above and the CHD Risk Table to determine the patient's CHD Risk.        ATP III CLASSIFICATION (LDL):  <100     mg/dL   Optimal  100-129  mg/dL   Near or Above                    Optimal  130-159  mg/dL   Borderline  160-189  mg/dL   High  >190     mg/dL   Very High   ALT 35 09/17/2018   AST 44 (H) 09/17/2018   NA 137 09/17/2018   K 4.7 09/17/2018   CL 102 09/17/2018   CREATININE 1.47 (H) 09/17/2018   BUN 36 (H) 09/17/2018   CO2 26 09/17/2018   TSH 1.50 09/17/2018   INR 1.1 08/19/2018   HGBA1C 7.0 (H) 09/17/2018    Lab Results  Component Value Date   TSH 1.50  09/17/2018   Lab Results  Component Value Date   WBC 7.7 09/17/2018   HGB 12.3 09/17/2018   HCT 37.5 09/17/2018   MCV 99.8 09/17/2018   PLT 214.0 09/17/2018   Lab Results  Component Value Date   NA 137 09/17/2018   K 4.7 09/17/2018   CHLORIDE 104 11/01/2016   CO2 26 09/17/2018   GLUCOSE 120 (H) 09/17/2018   BUN 36 (H) 09/17/2018   CREATININE 1.47 (H) 09/17/2018   BILITOT 0.8 09/17/2018   ALKPHOS 56 09/17/2018   AST 44 (H) 09/17/2018   ALT 35 09/17/2018   PROT 7.2 09/17/2018   ALBUMIN 4.2 09/17/2018   CALCIUM 10.6 (H) 09/17/2018   ANIONGAP 12 08/22/2018   EGFR 50 (L) 11/01/2016   GFR 34.62 (L) 09/17/2018   Lab Results  Component Value Date   CHOL 220 (H) 09/17/2018   Lab Results  Component Value Date   HDL 45.10 09/17/2018   Lab Results  Component Value Date   LDLCALC (H) 01/31/2007    117        Total Cholesterol/HDL:CHD Risk Coronary Heart Disease Risk Table                     Men   Women  1/2 Average Risk   3.4   3.3  Average Risk       5.0   4.4  2 X Average Risk   9.6   7.1  3 X Average Risk  23.4   11.0        Use the calculated Patient Ratio above and the CHD Risk Table to determine the patient's CHD Risk.        ATP III CLASSIFICATION (LDL):  <100     mg/dL   Optimal  100-129  mg/dL   Near or Above                    Optimal  130-159  mg/dL   Borderline  160-189  mg/dL   High  >190     mg/dL   Very High   Lab Results  Component Value Date   TRIG 388.0 (H) 09/17/2018   Lab Results  Component Value Date   CHOLHDL 5 09/17/2018   Lab Results  Component Value Date   HGBA1C 7.0 (H) 09/17/2018  Assessment & Plan:   Problem List Items Addressed This Visit    Morbid obesity ([place]) (Chronic)   Relevant Orders   TSH (Completed)   Comprehensive metabolic panel   Cancer of kidney ([place])    She recently under went cryotherapy for right renal mass. She has recovered well.      Hyperlipidemia    Encouraged heart healthy diet, increase  exercise, avoid trans fats, consider a krill oil cap daily      Relevant Orders   Lipid panel (Completed)   Low serum vitamin D - Primary    Supplement and monitor      Relevant Orders   VITAMIN D 25 Hydroxy (Vit-D Deficiency, Fractures) (Completed)   DM (diabetes mellitus), type 2 ([facility])    hgba1c acceptable, minimize simple carbs. Increase exercise as tolerated. Continue current meds      Relevant Orders   Hemoglobin A1c (Completed)   CBC (Completed)   Comprehensive metabolic panel (Completed)   TSH (Completed)   Comprehensive metabolic panel   Malignant tumor of renal pelvis, right ([facility])    Tolerated cryoablation of right kidney last month. No trouble with recovery. Following with urology and interventional radiology did her ablation.       Renal insufficiency    Hydrate and monitor. Follow up with urology and nephrology         I am having Ileana Ladd. Nored maintain her diphenhydramine-acetaminophen, meclizine, hyoscyamine, aspirin EC, Fish Oil, nystatin cream, carvedilol, furosemide, LORazepam, Vitamin D (Ergocalciferol), atorvastatin, allopurinol, losartan, diazepam, and Calcium Carb-Cholecalciferol (CALCIUM 600 + D PO).  No orders of the defined types were placed in this encounter.    Penni Homans, MD

## 2018-09-27 ENCOUNTER — Other Ambulatory Visit: Payer: Self-pay | Admitting: Family Medicine

## 2018-10-09 ENCOUNTER — Other Ambulatory Visit: Payer: Self-pay

## 2018-10-09 ENCOUNTER — Other Ambulatory Visit (INDEPENDENT_AMBULATORY_CARE_PROVIDER_SITE_OTHER): Payer: Medicare HMO

## 2018-10-09 DIAGNOSIS — E119 Type 2 diabetes mellitus without complications: Secondary | ICD-10-CM | POA: Diagnosis not present

## 2018-10-09 LAB — COMPREHENSIVE METABOLIC PANEL
ALT: 36 U/L — ABNORMAL HIGH (ref 0–35)
AST: 47 U/L — ABNORMAL HIGH (ref 0–37)
Albumin: 4.1 g/dL (ref 3.5–5.2)
Alkaline Phosphatase: 56 U/L (ref 39–117)
BUN: 35 mg/dL — ABNORMAL HIGH (ref 6–23)
CO2: 28 mEq/L (ref 19–32)
Calcium: 10.2 mg/dL (ref 8.4–10.5)
Chloride: 99 mEq/L (ref 96–112)
Creatinine, Ser: 1.26 mg/dL — ABNORMAL HIGH (ref 0.40–1.20)
GFR: 41.35 mL/min — ABNORMAL LOW (ref >60.00)
Glucose, Bld: 206 mg/dL — ABNORMAL HIGH (ref 70–99)
Potassium: 4.4 mEq/L (ref 3.5–5.1)
Sodium: 136 mEq/L (ref 135–145)
Total Bilirubin: 0.9 mg/dL (ref 0.2–1.2)
Total Protein: 7.2 g/dL (ref 6.0–8.3)

## 2018-10-16 ENCOUNTER — Other Ambulatory Visit: Payer: Self-pay | Admitting: Internal Medicine

## 2018-10-29 ENCOUNTER — Other Ambulatory Visit: Payer: Self-pay | Admitting: Interventional Radiology

## 2018-10-29 DIAGNOSIS — N2889 Other specified disorders of kidney and ureter: Secondary | ICD-10-CM

## 2018-11-04 ENCOUNTER — Other Ambulatory Visit: Payer: Self-pay

## 2018-11-06 ENCOUNTER — Other Ambulatory Visit: Payer: Self-pay

## 2018-11-07 ENCOUNTER — Other Ambulatory Visit: Payer: Self-pay

## 2018-11-07 ENCOUNTER — Other Ambulatory Visit: Payer: Self-pay | Admitting: Family Medicine

## 2018-11-07 DIAGNOSIS — R69 Illness, unspecified: Secondary | ICD-10-CM | POA: Diagnosis not present

## 2018-11-07 MED ORDER — CARVEDILOL 6.25 MG PO TABS: 6.2500 mg | ORAL_TABLET | Freq: Two times a day (BID) | ORAL | 0 refills | Status: DC

## 2018-11-08 DIAGNOSIS — R69 Illness, unspecified: Secondary | ICD-10-CM | POA: Diagnosis not present

## 2018-11-18 ENCOUNTER — Other Ambulatory Visit: Payer: Self-pay | Admitting: Physician Assistant

## 2018-11-20 ENCOUNTER — Other Ambulatory Visit: Payer: Self-pay

## 2018-11-20 ENCOUNTER — Other Ambulatory Visit: Payer: Self-pay | Admitting: Oncology

## 2018-11-20 ENCOUNTER — Ambulatory Visit (HOSPITAL_COMMUNITY)
Admission: RE | Admit: 2018-11-20 | Discharge: 2018-11-20 | Disposition: A | Discharge status: Home / Self Care | Payer: Medicare HMO | Source: Ambulatory Visit | Attending: Interventional Radiology | Admitting: Interventional Radiology

## 2018-11-20 DIAGNOSIS — C649 Malignant neoplasm of unspecified kidney, except renal pelvis: Secondary | ICD-10-CM

## 2018-11-20 DIAGNOSIS — D4989 Neoplasm of unspecified behavior of other specified sites: Secondary | ICD-10-CM

## 2018-11-20 DIAGNOSIS — N2889 Other specified disorders of kidney and ureter: Secondary | ICD-10-CM | POA: Diagnosis present

## 2018-11-20 DIAGNOSIS — K7460 Unspecified cirrhosis of liver: Secondary | ICD-10-CM | POA: Diagnosis not present

## 2018-11-20 MED ORDER — IOHEXOL 300 MG/ML  SOLN
75.0000 mL | Freq: Once | INTRAMUSCULAR | Status: AC | PRN
  Administered 2018-11-20: 75 mL via INTRAVENOUS

## 2018-11-20 MED ORDER — SODIUM CHLORIDE (PF) 0.9 % IJ SOLN
INTRAMUSCULAR | Status: AC
  Filled 2018-11-20: qty 50

## 2018-11-20 NOTE — Progress Notes (Signed)
Results of the CT scan discussed that the day with the patient via phone.  He has no evidence of recurrent disease.  I recommended repeat imaging studies in [place] follow-up in 6 months.  We will arrange for this to be done and she will be contacted by scheduling in the near future.

## 2018-11-21 ENCOUNTER — Telehealth: Payer: Self-pay | Admitting: Oncology

## 2018-11-21 NOTE — Telephone Encounter (Signed)
Scheduled appt per 10/21 sch message - mailed reminder letter per 10/21 sch message . [facility] to contact patient with ct scan

## 2018-11-26 ENCOUNTER — Other Ambulatory Visit: Payer: Self-pay

## 2018-11-26 ENCOUNTER — Ambulatory Visit
Admission: RE | Admit: 2018-11-26 | Discharge: 2018-11-26 | Disposition: A | Discharge status: Home / Self Care | Payer: Medicare HMO | Source: Ambulatory Visit | Attending: Interventional Radiology | Admitting: Interventional Radiology

## 2018-11-26 DIAGNOSIS — N2889 Other specified disorders of kidney and ureter: Secondary | ICD-10-CM

## 2018-11-29 ENCOUNTER — Other Ambulatory Visit: Payer: Self-pay | Admitting: Physician Assistant

## 2018-12-03 ENCOUNTER — Other Ambulatory Visit: Payer: Self-pay | Admitting: Family Medicine

## 2018-12-03 ENCOUNTER — Other Ambulatory Visit: Payer: Self-pay | Admitting: Internal Medicine

## 2018-12-04 DIAGNOSIS — R69 Illness, unspecified: Secondary | ICD-10-CM | POA: Diagnosis not present

## 2018-12-07 DIAGNOSIS — N179 Acute kidney failure, unspecified: Secondary | ICD-10-CM | POA: Diagnosis not present

## 2018-12-07 DIAGNOSIS — C649 Malignant neoplasm of unspecified kidney, except renal pelvis: Secondary | ICD-10-CM | POA: Diagnosis not present

## 2018-12-07 DIAGNOSIS — Z7984 Long term (current) use of oral hypoglycemic drugs: Secondary | ICD-10-CM | POA: Diagnosis not present

## 2018-12-07 DIAGNOSIS — C642 Malignant neoplasm of left kidney, except renal pelvis: Secondary | ICD-10-CM | POA: Diagnosis not present

## 2018-12-07 DIAGNOSIS — N1831 Chronic kidney disease, stage 3a: Secondary | ICD-10-CM | POA: Diagnosis not present

## 2018-12-07 DIAGNOSIS — R4189 Other symptoms and signs involving cognitive functions and awareness: Secondary | ICD-10-CM | POA: Diagnosis not present

## 2018-12-07 DIAGNOSIS — I129 Hypertensive chronic kidney disease with stage 1 through stage 4 chronic kidney disease, or unspecified chronic kidney disease: Secondary | ICD-10-CM | POA: Diagnosis not present

## 2018-12-07 DIAGNOSIS — Z905 Acquired absence of kidney: Secondary | ICD-10-CM | POA: Diagnosis not present

## 2018-12-07 DIAGNOSIS — R590 Localized enlarged lymph nodes: Secondary | ICD-10-CM | POA: Diagnosis not present

## 2018-12-07 DIAGNOSIS — Z7982 Long term (current) use of aspirin: Secondary | ICD-10-CM | POA: Diagnosis not present

## 2018-12-07 DIAGNOSIS — K5732 Diverticulitis of large intestine without perforation or abscess without bleeding: Secondary | ICD-10-CM | POA: Diagnosis not present

## 2018-12-07 DIAGNOSIS — R112 Nausea with vomiting, unspecified: Secondary | ICD-10-CM | POA: Diagnosis not present

## 2018-12-07 DIAGNOSIS — R1032 Left lower quadrant pain: Secondary | ICD-10-CM | POA: Diagnosis not present

## 2018-12-07 DIAGNOSIS — N2889 Other specified disorders of kidney and ureter: Secondary | ICD-10-CM | POA: Diagnosis not present

## 2018-12-08 DIAGNOSIS — N179 Acute kidney failure, unspecified: Secondary | ICD-10-CM | POA: Diagnosis not present

## 2018-12-08 DIAGNOSIS — K5732 Diverticulitis of large intestine without perforation or abscess without bleeding: Secondary | ICD-10-CM | POA: Diagnosis not present

## 2018-12-08 DIAGNOSIS — N390 Urinary tract infection, site not specified: Secondary | ICD-10-CM | POA: Diagnosis not present

## 2018-12-09 DIAGNOSIS — C642 Malignant neoplasm of left kidney, except renal pelvis: Secondary | ICD-10-CM | POA: Diagnosis not present

## 2018-12-09 DIAGNOSIS — K5732 Diverticulitis of large intestine without perforation or abscess without bleeding: Secondary | ICD-10-CM | POA: Diagnosis not present

## 2018-12-09 DIAGNOSIS — N1831 Chronic kidney disease, stage 3a: Secondary | ICD-10-CM | POA: Diagnosis not present

## 2018-12-09 DIAGNOSIS — N179 Acute kidney failure, unspecified: Secondary | ICD-10-CM | POA: Diagnosis not present

## 2018-12-10 MED ORDER — CEFDINIR 300 MG PO CAPS
300.00 | ORAL_CAPSULE | ORAL | Status: DC
Start: 2018-12-09 — End: 2018-12-10

## 2018-12-10 MED ORDER — METRONIDAZOLE 250 MG PO TABS
250.00 | ORAL_TABLET | ORAL | Status: DC
Start: 2018-12-09 — End: 2018-12-10

## 2018-12-10 MED ORDER — CVS TUSSIN DM CLEAR PO
2.00 | ORAL | Status: DC
Start: ? — End: 2018-12-10

## 2018-12-10 MED ORDER — SODIUM CHLORIDE 0.9 % IV SOLN
75.00 | INTRAVENOUS | Status: DC
Start: ? — End: 2018-12-10

## 2018-12-10 MED ORDER — GENERIC EXTERNAL MEDICATION
Status: DC
Start: ? — End: 2018-12-10

## 2018-12-10 MED ORDER — LACTASE PO
1.00 | ORAL | Status: DC
Start: ? — End: 2018-12-10

## 2018-12-10 MED ORDER — VICON FORTE PO CAPS
17.00 | ORAL_CAPSULE | ORAL | Status: DC
Start: ? — End: 2018-12-10

## 2018-12-10 MED ORDER — CVS TUSSIN DM CLEAR PO
4.00 | ORAL | Status: DC
Start: ? — End: 2018-12-10

## 2018-12-10 MED ORDER — ACETAMINOPHEN 650 MG RE SUPP
650.00 | RECTAL | Status: DC
Start: ? — End: 2018-12-10

## 2018-12-10 MED ORDER — ENOXAPARIN SODIUM 40 MG/0.4ML ~~LOC~~ SOLN
40.00 | SUBCUTANEOUS | Status: DC
Start: 2018-12-09 — End: 2018-12-10

## 2018-12-10 MED ORDER — SODIUM CHLORIDE 0.9 % IV SOLN
10.00 | INTRAVENOUS | Status: DC
Start: ? — End: 2018-12-10

## 2018-12-10 MED ORDER — HYDROCODONE-ACETAMINOPHEN 5-325 MG PO TABS
1.00 | ORAL_TABLET | ORAL | Status: DC
Start: ? — End: 2018-12-10

## 2018-12-10 MED ORDER — Medication
Status: DC
Start: ? — End: 2018-12-10

## 2018-12-10 MED ORDER — ACETAMINOPHEN 325 MG PO TABS
650.00 | ORAL_TABLET | ORAL | Status: DC
Start: ? — End: 2018-12-10

## 2018-12-19 ENCOUNTER — Other Ambulatory Visit: Payer: Self-pay

## 2018-12-19 ENCOUNTER — Ambulatory Visit: Payer: Medicare HMO | Admitting: Family Medicine

## 2018-12-24 ENCOUNTER — Ambulatory Visit
Admission: RE | Admit: 2018-12-24 | Discharge: 2018-12-24 | Disposition: A | Discharge status: Home / Self Care | Payer: Medicare HMO | Source: Ambulatory Visit | Attending: Interventional Radiology | Admitting: Interventional Radiology

## 2018-12-24 ENCOUNTER — Telehealth: Payer: Self-pay | Admitting: Interventional Radiology

## 2018-12-24 ENCOUNTER — Other Ambulatory Visit: Payer: Self-pay

## 2018-12-24 NOTE — Telephone Encounter (Signed)
Phoned pt for tele visit, no answer, LM. CT looks good, would get 35mo f/u.

## 2018-12-25 ENCOUNTER — Other Ambulatory Visit: Payer: Self-pay | Admitting: Physician Assistant

## 2019-01-06 ENCOUNTER — Other Ambulatory Visit: Payer: Self-pay

## 2019-01-06 ENCOUNTER — Ambulatory Visit (INDEPENDENT_AMBULATORY_CARE_PROVIDER_SITE_OTHER): Payer: Medicare HMO | Admitting: Family Medicine

## 2019-01-06 DIAGNOSIS — K5792 Diverticulitis of intestine, part unspecified, without perforation or abscess without bleeding: Secondary | ICD-10-CM

## 2019-01-06 DIAGNOSIS — E119 Type 2 diabetes mellitus without complications: Secondary | ICD-10-CM | POA: Diagnosis not present

## 2019-01-06 DIAGNOSIS — E785 Hyperlipidemia, unspecified: Secondary | ICD-10-CM | POA: Diagnosis not present

## 2019-01-06 DIAGNOSIS — N289 Disorder of kidney and ureter, unspecified: Secondary | ICD-10-CM | POA: Diagnosis not present

## 2019-01-06 DIAGNOSIS — R7989 Other specified abnormal findings of blood chemistry: Secondary | ICD-10-CM | POA: Diagnosis not present

## 2019-01-06 NOTE — Assessment & Plan Note (Signed)
hgba1c acceptable, minimize simple carbs. Increase exercise as tolerated. Continue current meds 

## 2019-01-06 NOTE — Assessment & Plan Note (Signed)
Encouraged heart healthy diet, increase exercise, avoid trans fats, consider a krill oil cap daily 

## 2019-01-06 NOTE — Assessment & Plan Note (Signed)
Supplement and monitor 

## 2019-01-06 NOTE — Assessment & Plan Note (Signed)
Hydrate and monitor 

## 2019-01-07 ENCOUNTER — Other Ambulatory Visit: Payer: Self-pay | Admitting: Physician Assistant

## 2019-01-13 NOTE — Assessment & Plan Note (Signed)
She was recently seen at [facility] and treated for Diverticulitis and she is feeling much better. No concerns in this regard. No changes.

## 2019-01-13 NOTE — Progress Notes (Signed)
Virtual Visit via phone Note  I connected with Amanda Crawford on 01/13/19 at  2:00 PM EST by a phone enabled telemedicine application and verified that I am speaking with the correct person using two identifiers.  Location: Patient: home Provider: office   I discussed the limitations of evaluation and management by telemedicine and the availability of in person appointments. The patient expressed understanding and agreed to proceed. CMA was able to get the patient set up on a phone visit after being unable to set up a video visit.    Subjective:    Patient ID: Amanda Crawford, female    DOB: 1945/05/26, 73 y.o.   MRN: [id number]  Chief Complaint  Patient presents with  . Hospitalization Follow-up    at [facility].    HPI Patient is in today for follow up on chronic medical concerns including recent diverticulitis, low vitamin D deficiency, and more. She was recently treated for diverticulitis [facility] and she is feeling much better. No concerning symptoms remain. No fevers or chills. Her appetite is good. Denies CP/palp/SOB/HA/congestion/fevers or GU c/o. Taking meds as prescribed  Past Medical History:  Diagnosis Date  . Allergic state 03/12/2015  . Arthritis   . Colon polyp 11/19/2014  . Dermatitis 03/12/2015  . Diarrhea 06/29/2016  . DM (diabetes mellitus), type 2 ([place]) 06/29/2016  . GERD (gastroesophageal reflux disease)    occ  . Gout 03/12/2015  . H/O measles   . H/O mumps   . Headache(784.0)    migraines  . History of chicken pox 11/23/2014  . Hyperglycemia 06/29/2016  . Lung cancer ([place])   . Migraine 11/23/2014  . Obesity 11/23/2014  . Pneumonia    child  . Preventative health care 09/12/2015  . Renal cell cancer ([place])    renal cell ca dx 9/01 and 8/08;  . Shortness of breath    occ  . Skin lesion of breast 03/12/2015    Past Surgical History:  Procedure Laterality Date  . ABDOMINAL HYSTERECTOMY    . APPENDECTOMY    . CARDIAC CATHETERIZATION     yrs ago  neg  . CHOLECYSTECTOMY    . IR RADIOLOGIST EVAL & MGMT  07/17/2018  . IR RADIOLOGIST EVAL & MGMT  09/12/2018  . LUNG CANCER SURGERY Left 08  . RADIOLOGY WITH ANESTHESIA N/A 08/21/2018   Procedure: CT WITH ANESTHESIA RENAL CRYOABLATION;  Surgeon: Arne Cleveland, MD;  Location: [facility];  Service: Radiology;  Laterality: N/A;  . RENAL MASS EXCISION Left 01  . RIGHT/LEFT HEART CATH AND CORONARY ANGIOGRAPHY N/A 07/27/2016   Procedure: Right/Left Heart Cath and Coronary Angiography;  Surgeon: Leonie Man, MD;  Location: [place] [facility];  Service: Cardiovascular;  Laterality: N/A;  . THORACOTOMY Left 05/09/2012   Procedure: THORACOTOMY MAJOR;  Surgeon: Gaye Pollack, MD;  Location: [place];  Service: Thoracic;  Laterality: Left;  Marland Kitchen VIDEO BRONCHOSCOPY N/A 05/09/2012   Procedure: VIDEO BRONCHOSCOPY;  Surgeon: Gaye Pollack, MD;  Location: [facility];  Service: Thoracic;  Laterality: N/A;  . WEDGE RESECTION Left 05/09/2012   Procedure: LEFT UPPER LOBE WEDGE RESECTION;  Surgeon: Gaye Pollack, MD;  [facility];  Service: Thoracic;  Laterality: Left;    Family History  Problem Relation Age of Onset  . Heart failure Father   . Diabetes Father   . Hyperlipidemia Father   . Heart disease Father   . Hypertension Father   . Asthma Brother   . Asthma Daughter   .  Cancer Paternal Grandmother   . Hearing loss Paternal Grandfather   . Stroke Paternal Grandfather   . Alcohol abuse Maternal Aunt     Social History   Socioeconomic History  . Marital status: Married    Spouse name: Not on file  . Number of children: Not on file  . Years of education: Not on file  . Highest education level: Not on file  Occupational History  . Not on file  Tobacco Use  . Smoking status: Former Smoker    Packs/day: 0.50    Years: 35.00    Pack years: 17.50    Types: Cigarettes    Quit date: 05/08/2006    Years since quitting: 12.6  . Smokeless tobacco: Never Used  Substance and Sexual Activity  . Alcohol  use: No  . Drug use: No  . Sexual activity: Not on file    Comment: lives with husband, no dietary restrictions.   Other Topics Concern  . Not on file  Social History Narrative  . Not on file   Social Determinants of Health   Financial Resource Strain:   . Difficulty of Paying Living Expenses: Not on file  Food Insecurity:   . Worried About Charity fundraiser in the Last Year: Not on file  . Ran Out of Food in the Last Year: Not on file  Transportation Needs:   . Lack of Transportation (Medical): Not on file  . Lack of Transportation (Non-Medical): Not on file  Physical Activity:   . Days of Exercise per Week: Not on file  . Minutes of Exercise per Session: Not on file  Stress:   . Feeling of Stress : Not on file  Social Connections:   . Frequency of Communication with Friends and Family: Not on file  . Frequency of Social Gatherings with Friends and Family: Not on file  . Attends Religious Services: Not on file  . Active Member of Clubs or Organizations: Not on file  . Attends Archivist Meetings: Not on file  . Marital Status: Not on file  Intimate Partner Violence:   . Fear of Current or Ex-Partner: Not on file  . Emotionally Abused: Not on file  . Physically Abused: Not on file  . Sexually Abused: Not on file    Outpatient Medications Prior to Visit  Medication Sig Dispense Refill  . allopurinol (ZYLOPRIM) 300 MG tablet TAKE 1 TABLET BY MOUTH EVERY DAY 90 tablet 1  . aspirin EC 81 MG tablet Take 81 mg by mouth daily.    Marland Kitchen atorvastatin (LIPITOR) 10 MG tablet Take 1 tablet (10 mg total) by mouth every other day. 45 tablet 1  . Calcium Carb-Cholecalciferol (CALCIUM 600 + D PO) Take 1 tablet by mouth daily.    . diphenhydramine-acetaminophen (TYLENOL PM) 25-500 MG TABS Take 1 tablet by mouth at bedtime as needed (sleep).     . furosemide (LASIX) 40 MG tablet TAKE 1 TABLET BY MOUTH EVERY DAY 90 tablet 0  . hyoscyamine (LEVSIN SL) 0.125 MG SL tablet Place 1  tablet (0.125 mg total) under the tongue every 4 (four) hours as needed. (Patient taking differently: Place 0.125 mg under the tongue every 4 (four) hours as needed for cramping. ) 30 tablet 1  . losartan (COZAAR) 25 MG tablet Take 1 tablet (25 mg total) by mouth daily. Please make annual appt with Dr. Debara Pickett for future refills. [phone]. 1st attempt. 30 tablet 0  . meclizine (ANTIVERT) 12.5 MG tablet Take 1  tablet (12.5 mg total) by mouth 3 (three) times daily as needed for dizziness. As needed for   Dizziness or nausea 30 tablet 1  . metFORMIN (GLUCOPHAGE) 500 MG tablet TAKE 1 TABLET BY MOUTH EVERY DAY 90 tablet 1  . nystatin cream (MYCOSTATIN) Apply 1 application topically 2 (two) times daily. 30 g 1  . Omega-3 Fatty Acids (FISH OIL) 1000 MG CAPS Take 1 capsule (1,000 mg total) by mouth 2 (two) times daily.  0  . OneTouch Delica Lancets 20E MISC USE AS DIRECTED TO TEST DAILY 100 each 2  . ONETOUCH VERIO test strip CEHCK BLOOD SUGAR DAILY AS NEEDED 100 strip 2  . Vitamin D, Ergocalciferol, (DRISDOL) 1.25 MG (50000 UT) CAPS capsule TAKE 1 CAPSULE (50,000 UNITS TOTAL) BY MOUTH EVERY 7 (SEVEN) DAYS. 12 capsule 1  . carvedilol (COREG) 6.25 MG tablet TAKE 1 TABLET (6.25 MG TOTAL) BY MOUTH 2 (TWO) TIMES DAILY. PT NEEDS AN OV FOR FUTURE REFILLS 30 tablet 0  . diazepam (VALIUM) 5 MG tablet Take 1 tablet 1 hour prior to procedure-repeat if needed (Patient not taking: Reported on 09/17/2018) 3 tablet 0  . LORazepam (ATIVAN) 1 MG tablet Take 1 tablet (1 mg total) by mouth every 8 (eight) hours. (Patient not taking: Reported on 08/13/2018) 6 tablet 0   No facility-administered medications prior to visit.    No Known Allergies  Review of Systems  Constitutional: Positive for malaise/fatigue. Negative for fever.  HENT: Negative for congestion.   Eyes: Negative for blurred vision.  Respiratory: Negative for shortness of breath.   Cardiovascular: Negative for chest pain, palpitations and leg swelling.    Gastrointestinal: Negative for abdominal pain, blood in stool and nausea.  Genitourinary: Negative for dysuria and frequency.  Musculoskeletal: Negative for falls.  Skin: Negative for rash.  Neurological: Negative for dizziness, loss of consciousness and headaches.  Endo/Heme/Allergies: Negative for environmental allergies.  Psychiatric/Behavioral: Negative for depression. The patient is not nervous/anxious.        Objective:    Physical Exam unable to obtain via phone.   SpO2 94%  Wt Readings from Last 3 Encounters:  09/17/18 213 lb 12.8 oz (97 kg)  08/21/18 214 lb 7 oz (97.3 kg)  08/19/18 214 lb 7 oz (97.3 kg)    Diabetic Foot Exam - Simple   No data filed     Lab Results  Component Value Date   WBC 7.7 09/17/2018   HGB 12.3 09/17/2018   HCT 37.5 09/17/2018   PLT 214.0 09/17/2018   GLUCOSE 206 (H) 10/09/2018   CHOL 220 (H) 09/17/2018   TRIG 388.0 (H) 09/17/2018   HDL 45.10 09/17/2018   LDLDIRECT 97.0 09/17/2018   LDLCALC (H) 01/31/2007    117        Total Cholesterol/HDL:CHD Risk Coronary Heart Disease Risk Table                     Men   Women  1/2 Average Risk   3.4   3.3  Average Risk       5.0   4.4  2 X Average Risk   9.6   7.1  3 X Average Risk  23.4   11.0        Use the calculated Patient Ratio above and the CHD Risk Table to determine the patient's CHD Risk.        ATP III CLASSIFICATION (LDL):  <100     mg/dL   Optimal  100-129  mg/dL  Near or Above                    Optimal  130-159  mg/dL   Borderline  160-189  mg/dL   High  >190     mg/dL   Very High   ALT 36 (H) 10/09/2018   AST 47 (H) 10/09/2018   NA 136 10/09/2018   K 4.4 10/09/2018   CL 99 10/09/2018   CREATININE 1.26 (H) 10/09/2018   BUN 35 (H) 10/09/2018   CO2 28 10/09/2018   TSH 1.50 09/17/2018   INR 1.1 08/19/2018   HGBA1C 7.0 (H) 09/17/2018    Lab Results  Component Value Date   TSH 1.50 09/17/2018   Lab Results  Component Value Date   WBC 7.7 09/17/2018   HGB  12.3 09/17/2018   HCT 37.5 09/17/2018   MCV 99.8 09/17/2018   PLT 214.0 09/17/2018   Lab Results  Component Value Date   NA 136 10/09/2018   K 4.4 10/09/2018   CHLORIDE 104 11/01/2016   CO2 28 10/09/2018   GLUCOSE 206 (H) 10/09/2018   BUN 35 (H) 10/09/2018   CREATININE 1.26 (H) 10/09/2018   BILITOT 0.9 10/09/2018   ALKPHOS 56 10/09/2018   AST 47 (H) 10/09/2018   ALT 36 (H) 10/09/2018   PROT 7.2 10/09/2018   ALBUMIN 4.1 10/09/2018   CALCIUM 10.2 10/09/2018   ANIONGAP 12 08/22/2018   EGFR 50 (L) 11/01/2016   GFR 41.35 (L) 10/09/2018   Lab Results  Component Value Date   CHOL 220 (H) 09/17/2018   Lab Results  Component Value Date   HDL 45.10 09/17/2018   Lab Results  Component Value Date   LDLCALC (H) 01/31/2007    117        Total Cholesterol/HDL:CHD Risk Coronary Heart Disease Risk Table                     Men   Women  1/2 Average Risk   3.4   3.3  Average Risk       5.0   4.4  2 X Average Risk   9.6   7.1  3 X Average Risk  23.4   11.0        Use the calculated Patient Ratio above and the CHD Risk Table to determine the patient's CHD Risk.        ATP III CLASSIFICATION (LDL):  <100     mg/dL   Optimal  100-129  mg/dL   Near or Above                    Optimal  130-159  mg/dL   Borderline  160-189  mg/dL   High  >190     mg/dL   Very High   Lab Results  Component Value Date   TRIG 388.0 (H) 09/17/2018   Lab Results  Component Value Date   CHOLHDL 5 09/17/2018   Lab Results  Component Value Date   HGBA1C 7.0 (H) 09/17/2018       Assessment & Plan:   Problem List Items Addressed This Visit    Hyperlipidemia    Encouraged heart healthy diet, increase exercise, avoid trans fats, consider a krill oil cap daily      Relevant Orders   CBC with Differential/Platelet   TSH   Lipid panel   Low serum vitamin D    Supplement and monitor      Relevant Orders  CBC with Differential/Platelet   TSH   VITAMIN D   DM (diabetes mellitus), type 2  (HCC)    hgba1c acceptable, minimize simple carbs. Increase exercise as tolerated. Continue current meds.      Relevant Orders   CBC with Differential/Platelet   TSH   A1C   Renal insufficiency    Hydrate and monitor      Relevant Orders   CMP   Diverticulitis    She was recently seen at [facility] and treated for Diverticulitis and she is feeling much better. No concerns in this regard. No changes.          I have discontinued Ileana Ladd. Landess's LORazepam and diazepam. I am also having her maintain her diphenhydramine-acetaminophen, meclizine, hyoscyamine, aspirin EC, Fish Oil, nystatin cream, atorvastatin, Calcium Carb-Cholecalciferol (CALCIUM 600 + D PO), metFORMIN, Vitamin D (Ergocalciferol), furosemide, OneTouch Verio, allopurinol, losartan, and OneTouch Delica Lancets 96M.  No orders of the defined types were placed in this encounter.    I discussed the assessment and treatment plan with the patient. The patient was provided an opportunity to ask questions and all were answered. The patient agreed with the plan and demonstrated an understanding of the instructions.   The patient was advised to call back or seek an in-person evaluation if the symptoms worsen or if the condition fails to improve as anticipated.  I provided 25 minutes of non-face-to-face time during this encounter.   Penni Homans, MD

## 2019-01-14 ENCOUNTER — Other Ambulatory Visit: Payer: Self-pay | Admitting: Internal Medicine

## 2019-01-15 ENCOUNTER — Encounter: Payer: Self-pay | Admitting: Adult Health

## 2019-01-15 ENCOUNTER — Telehealth (INDEPENDENT_AMBULATORY_CARE_PROVIDER_SITE_OTHER): Payer: Medicare HMO | Admitting: Adult Health

## 2019-01-15 ENCOUNTER — Telehealth: Payer: Medicare HMO | Admitting: Physician Assistant

## 2019-01-15 VITALS — BP 132/75 | HR 80 | Ht 64.0 in | Wt 209.0 lb

## 2019-01-15 DIAGNOSIS — I10 Essential (primary) hypertension: Secondary | ICD-10-CM | POA: Diagnosis not present

## 2019-01-15 DIAGNOSIS — E785 Hyperlipidemia, unspecified: Secondary | ICD-10-CM

## 2019-01-15 DIAGNOSIS — I5022 Chronic systolic (congestive) heart failure: Secondary | ICD-10-CM

## 2019-01-15 DIAGNOSIS — I447 Left bundle-branch block, unspecified: Secondary | ICD-10-CM

## 2019-01-15 DIAGNOSIS — R0602 Shortness of breath: Secondary | ICD-10-CM

## 2019-01-15 NOTE — Progress Notes (Signed)
Virtual Visit via Telephone Note   This visit type was conducted due to national recommendations for restrictions regarding the COVID-19 Pandemic (e.g. social distancing) in an effort to limit this patient's exposure and mitigate transmission in our community.  Due to her co-morbid illnesses, this patient is at least at moderate risk for complications without adequate follow up.  This format is felt to be most appropriate for this patient at this time.  The patient did not have access to video technology/had technical difficulties with video requiring transitioning to audio format only (telephone).  All issues noted in this document were discussed and addressed.  No physical exam could be performed with this format.  Please refer to the patient's chart for her  consent to telehealth for [facility].   Date:  01/15/2019   ID:  ANNALISSA MURPHEY, DOB 01/27/1946, MRN [id number]  Patient Location: Home Provider Location: Home  PCP:  [name], MD  Cardiologist:  Dr. Debara Pickett  Electrophysiologist:  None   Evaluation Performed:  Follow-Up Visit  Chief Complaint:  Follow Up  History of Present Illness:    IKRAN PATMAN is a 73 y.o. female with we are following for ongoing assessment and management of dyspnea and hypertension Cardiac cath June of 2018 with demonstrated non-obstructive CAD, with decreased LV fx. Repeat echocardiogram April in 2019 revealed EF of 45%-50%.  She has a history of persistent LBBB and dyslipidemia.   She was admitted in September of 2020 for diverticulitis, with other history of diabetes, GERD, Renal carcinoma. She was last seen by Dr. Debara Pickett on 07/30/2017. She had increase in losartan to 25 mg daily.   Today she is without cardiac complaints. She is remaining mostly in her home due to the pandemic, and finds this frustrating. She denies worsening DOE, and remains at baseline for her activity level. She is medically compliant. Follows closely with Dr. Randel Pigg   The  patient does not have symptoms concerning for COVID-19 infection (fever, chills, cough, or new shortness of breath).    Past Medical History:  Diagnosis Date  . Allergic state 03/12/2015  . Arthritis   . Colon polyp 11/19/2014  . Dermatitis 03/12/2015  . Diarrhea 06/29/2016  . DM (diabetes mellitus), type 2 ([place]) 06/29/2016  . GERD (gastroesophageal reflux disease)    occ  . Gout 03/12/2015  . H/O measles   . H/O mumps   . Headache(784.0)    migraines  . History of chicken pox 11/23/2014  . Hyperglycemia 06/29/2016  . Lung cancer ([place])   . Migraine 11/23/2014  . Obesity 11/23/2014  . Pneumonia    child  . Preventative health care 09/12/2015  . Renal cell cancer ([place])    renal cell ca dx 9/01 and 8/08;  . Shortness of breath    occ  . Skin lesion of breast 03/12/2015   Past Surgical History:  Procedure Laterality Date  . ABDOMINAL HYSTERECTOMY    . APPENDECTOMY    . CARDIAC CATHETERIZATION     yrs ago neg  . CHOLECYSTECTOMY    . IR RADIOLOGIST EVAL & MGMT  07/17/2018  . IR RADIOLOGIST EVAL & MGMT  09/12/2018  . LUNG CANCER SURGERY Left 08  . RADIOLOGY WITH ANESTHESIA N/A 08/21/2018   Procedure: CT WITH ANESTHESIA RENAL CRYOABLATION;  Surgeon: Arne Cleveland, MD;  [facility];  Service: Radiology;  Laterality: N/A;  . RENAL MASS EXCISION Left 01  . RIGHT/LEFT HEART CATH AND CORONARY ANGIOGRAPHY N/A 07/27/2016   Procedure:  Right/Left Heart Cath and Coronary Angiography;  Surgeon: Leonie Man, MD;  Location: [place] [facility];  Service: Cardiovascular;  Laterality: N/A;  . THORACOTOMY Left 05/09/2012   Procedure: THORACOTOMY MAJOR;  Surgeon: Gaye Pollack, MD;  Location: [place];  Service: Thoracic;  Laterality: Left;  Marland Kitchen VIDEO BRONCHOSCOPY N/A 05/09/2012   Procedure: VIDEO BRONCHOSCOPY;  Surgeon: Gaye Pollack, MD;  Location: [place];  Service: Thoracic;  Laterality: N/A;  . WEDGE RESECTION Left 05/09/2012   Procedure: LEFT UPPER LOBE WEDGE RESECTION;  Surgeon: Gaye Pollack, MD;  [facility];  Service: Thoracic;  Laterality: Left;     Current Meds  Medication Sig  . allopurinol (ZYLOPRIM) 300 MG tablet TAKE 1 TABLET BY MOUTH EVERY DAY  . aspirin EC 81 MG tablet Take 81 mg by mouth daily.  Marland Kitchen atorvastatin (LIPITOR) 10 MG tablet Take 1 tablet (10 mg total) by mouth every other day.  . Calcium Carb-Cholecalciferol (CALCIUM 600 + D PO) Take 1 tablet by mouth daily.  . carvedilol (COREG) 6.25 MG tablet TAKE 1 TABLET (6.25 MG TOTAL) BY MOUTH 2 (TWO) TIMES DAILY. PT NEEDS AN OV FOR FUTURE REFILLS  . diphenhydramine-acetaminophen (TYLENOL PM) 25-500 MG TABS Take 1 tablet by mouth at bedtime as needed (sleep).   . furosemide (LASIX) 40 MG tablet TAKE 1 TABLET BY MOUTH EVERY DAY  . hyoscyamine (LEVSIN SL) 0.125 MG SL tablet Place 1 tablet (0.125 mg total) under the tongue every 4 (four) hours as needed. (Patient taking differently: Place 0.125 mg under the tongue every 4 (four) hours as needed for cramping. )  . losartan (COZAAR) 25 MG tablet Take 1 tablet (25 mg total) by mouth daily. Please make annual appt with Dr. Debara Pickett for future refills. [phone]. 1st attempt.  . meclizine (ANTIVERT) 12.5 MG tablet Take 1 tablet (12.5 mg total) by mouth 3 (three) times daily as needed for dizziness. As needed for   Dizziness or nausea  . metFORMIN (GLUCOPHAGE) 500 MG tablet TAKE 1 TABLET BY MOUTH EVERY DAY  . nystatin cream (MYCOSTATIN) Apply 1 application topically 2 (two) times daily.  . Omega-3 Fatty Acids (FISH OIL) 1000 MG CAPS Take 1 capsule (1,000 mg total) by mouth 2 (two) times daily.  Glory Rosebush Delica Lancets 98X MISC USE AS DIRECTED TO TEST DAILY  . ONETOUCH VERIO test strip CEHCK BLOOD SUGAR DAILY AS NEEDED  . Vitamin D, Ergocalciferol, (DRISDOL) 1.25 MG (50000 UT) CAPS capsule TAKE 1 CAPSULE (50,000 UNITS TOTAL) BY MOUTH EVERY 7 (SEVEN) DAYS.     Allergies:   Patient has no known allergies.   Social History   Tobacco Use  . Smoking status: Former  Smoker    Packs/day: 0.50    Years: 35.00    Pack years: 17.50    Types: Cigarettes    Quit date: 05/08/2006    Years since quitting: 12.6  . Smokeless tobacco: Never Used  Substance Use Topics  . Alcohol use: No  . Drug use: No     Family Hx: The patient's family history includes Alcohol abuse in her maternal aunt; Asthma in her brother and daughter; Cancer in her paternal grandmother; Diabetes in her father; Hearing loss in her paternal grandfather; Heart disease in her father; Heart failure in her father; Hyperlipidemia in her father; Hypertension in her father; Stroke in her paternal grandfather.  ROS:   Please see the history of present illness.    All other systems reviewed and are negative.  Prior CV studies:   The following studies were reviewed today: Left ventricle: The cavity size was normal. Wall thickness was   increased in a pattern of moderate LVH. Systolic function was   mildly reduced. The estimated ejection fraction was in the range   of 45% to 50%. Diffuse hypokinesis. Doppler parameters are   consistent with abnormal left ventricular relaxation (grade 1   diastolic dysfunction). The E/e&' ratio is between 8-15,   suggesting indeterminate LV filling pressure. Ejection fraction   (MOD, 2-plane): 46%. - Aortic valve: Sclerosis without stenosis. There was no   regurgitation. - Mitral valve: Mildly thickened leaflets . There was trivial   regurgitation. - Left atrium: The atrium was normal in size. - Tricuspid valve: There was trivial regurgitation. - Pulmonary arteries: PA peak pressure: 17 mm Hg (S). - Inferior vena cava: The vessel was normal in size. The   respirophasic diameter changes were in the normal range (>= 50%),   consistent with normal central venous pressure.  Labs/Other Tests and Data Reviewed:    EKG:    Recent Labs: 09/17/2018: Hemoglobin 12.3; Platelets 214.0; TSH 1.50 10/09/2018: ALT 36; BUN 35; Creatinine, Ser 1.26; Potassium 4.4; Sodium  136   Recent Lipid Panel Lab Results  Component Value Date/Time   CHOL 220 (H) 09/17/2018 11:00 AM   TRIG 388.0 (H) 09/17/2018 11:00 AM   HDL 45.10 09/17/2018 11:00 AM   CHOLHDL 5 09/17/2018 11:00 AM   LDLCALC (H) 01/31/2007 08:00 AM    117        Total Cholesterol/HDL:CHD Risk Coronary Heart Disease Risk Table                     Men   Women  1/2 Average Risk   3.4   3.3  Average Risk       5.0   4.4  2 X Average Risk   9.6   7.1  3 X Average Risk  23.4   11.0        Use the calculated Patient Ratio above and the CHD Risk Table to determine the patient's CHD Risk.        ATP III CLASSIFICATION (LDL):  <100     mg/dL   Optimal  100-129  mg/dL   Near or Above                    Optimal  130-159  mg/dL   Borderline  160-189  mg/dL   High  >190     mg/dL   Very High   LDLDIRECT 97.0 09/17/2018 11:00 AM    Wt Readings from Last 3 Encounters:  01/15/19 209 lb (94.8 kg)  09/17/18 213 lb 12.8 oz (97 kg)  08/21/18 214 lb 7 oz (97.3 kg)     Objective:    Vital Signs:  BP 132/75   Pulse 80   Ht 5\' 4"  (1.626 m)   Wt 209 lb (94.8 kg)   BMI 35.87 kg/m    No exam as this was a telephone visit.   ASSESSMENT & PLAN:    1.  Chronic shortness of breath: She is stable from this standpoint.  She has mild shortness of breath when she exerts herself but otherwise she is not having significant issues.  She is not on oxygen, she is not decreased any of her activities due to shortness of breath.  2.  Hypertension: Blood pressure is well controlled at home.  She continues on carvedilol  6.25 mg twice daily, losartan 25 mg daily and Lasix 40 mg daily.  She denies any readings of hypertension on home blood pressure monitor.  She is due to have lab work completed by her primary care physician next week.  She denies any lower extremity edema or cramping on Lasix.  3.  Dyslipidemia: Labs per primary care.  Continue Lipitor 10 mg every other day.  4.  Chronic systolic CHF: No signs of fluid  overload although assessment is limited by telephone visit.  She denies any lower extremity edema or worsening dyspnea or fatigue.  Most recent echocardiogram April 2019 revealed improved LV systolic function of 45 to 50%.  May not need as high a dose of Lasix.  New labs per primary care next week.   COVID-19 Education: The signs and symptoms of COVID-19 were discussed with the patient and how to seek care for testing (follow up with PCP or arrange E-visit).  The importance of social distancing was discussed today.  Time:   Today, I have spent 15 minutes with the patient with telehealth technology discussing the above problems.     Medication Adjustments/Labs and Tests Ordered: Current medicines are reviewed at length with the patient today.  Concerns regarding medicines are outlined above.   Tests Ordered: No orders of the defined types were placed in this encounter.   Medication Changes: No orders of the defined types were placed in this encounter.   Disposition:  Follow up 6 months   Signed, Phill Myron. [place] Pugh, ANP, AACC  01/15/2019 10:44 AM    [facility]

## 2019-01-15 NOTE — Patient Instructions (Signed)
Medication Instructions:  Continue current medications  If you need a refill on your cardiac medications before your next appointment, please call your pharmacy.  Labwork: None Ordered   Testing/Procedures: None Ordered  Reduce your risk of getting COVID-19 With your heart disease it is especially important for people at increased risk of severe illness from COVID-19, and those who live with them, to protect themselves from getting COVID-19. The best way to protect yourself and to help reduce the spread of the virus that causes COVID-19 is to: Marland Kitchen Limit your interactions with other people as much as possible. . Take precautions to prevent getting COVID-19 when you do interact with others. If you start feeling sick and think you may have COVID-19, get in touch with your healthcare provider within 24 hours.  Follow-Up: IN 6 months Please call our office 2 months in advance, to schedule this appointment. In Person K. Mali Hilty, MD.    At [facility], you and your health needs are our priority.  As part of our continuing mission to provide you with exceptional heart care, we have created designated Provider Care Teams.  These Care Teams include your primary Cardiologist (physician) and Advanced Practice Providers (APPs -  Physician Assistants and Nurse Practitioners) who all work together to provide you with the care you need, when you need it.  Thank you for choosing [facility] at [facility]!!     Happy Holidays!!

## 2019-01-17 ENCOUNTER — Other Ambulatory Visit: Payer: Self-pay

## 2019-01-17 ENCOUNTER — Other Ambulatory Visit (INDEPENDENT_AMBULATORY_CARE_PROVIDER_SITE_OTHER): Payer: Medicare HMO

## 2019-01-17 DIAGNOSIS — E785 Hyperlipidemia, unspecified: Secondary | ICD-10-CM | POA: Diagnosis not present

## 2019-01-17 DIAGNOSIS — R7989 Other specified abnormal findings of blood chemistry: Secondary | ICD-10-CM | POA: Diagnosis not present

## 2019-01-17 DIAGNOSIS — E119 Type 2 diabetes mellitus without complications: Secondary | ICD-10-CM

## 2019-01-17 DIAGNOSIS — N289 Disorder of kidney and ureter, unspecified: Secondary | ICD-10-CM

## 2019-01-17 LAB — HEMOGLOBIN A1C: Hgb A1c MFr Bld: 6.7 % — ABNORMAL HIGH (ref 4.6–6.5)

## 2019-01-17 LAB — LIPID PANEL
Cholesterol: 169 mg/dL (ref 0–200)
HDL: 45.9 mg/dL (ref >39.00)
NonHDL: 122.8
Total CHOL/HDL Ratio: 4
Triglycerides: 261 mg/dL — ABNORMAL HIGH (ref 0.0–149.0)
VLDL: 52.2 mg/dL — ABNORMAL HIGH (ref 0.0–40.0)

## 2019-01-17 LAB — COMPREHENSIVE METABOLIC PANEL
ALT: 41 U/L — ABNORMAL HIGH (ref 0–35)
AST: 50 U/L — ABNORMAL HIGH (ref 0–37)
Albumin: 4.2 g/dL (ref 3.5–5.2)
Alkaline Phosphatase: 61 U/L (ref 39–117)
BUN: 34 mg/dL — ABNORMAL HIGH (ref 6–23)
CO2: 27 mEq/L (ref 19–32)
Calcium: 10.5 mg/dL (ref 8.4–10.5)
Chloride: 101 mEq/L (ref 96–112)
Creatinine, Ser: 1.39 mg/dL — ABNORMAL HIGH (ref 0.40–1.20)
GFR: 36.89 mL/min — ABNORMAL LOW (ref >60.00)
Glucose, Bld: 146 mg/dL — ABNORMAL HIGH (ref 70–99)
Potassium: 4.5 mEq/L (ref 3.5–5.1)
Sodium: 136 mEq/L (ref 135–145)
Total Bilirubin: 0.6 mg/dL (ref 0.2–1.2)
Total Protein: 7.8 g/dL (ref 6.0–8.3)

## 2019-01-17 LAB — CBC WITH DIFFERENTIAL/PLATELET
Basophils Absolute: 0 10*3/uL (ref 0.0–0.1)
Basophils Relative: 0.4 % (ref 0.0–3.0)
Eosinophils Absolute: 0.2 10*3/uL (ref 0.0–0.7)
Eosinophils Relative: 2.4 % (ref 0.0–5.0)
HCT: 38.1 % (ref 36.0–46.0)
Hemoglobin: 12.4 g/dL (ref 12.0–15.0)
Lymphocytes Relative: 25.1 % (ref 12.0–46.0)
Lymphs Abs: 2.2 10*3/uL (ref 0.7–4.0)
MCHC: 32.6 g/dL (ref 30.0–36.0)
MCV: 98.1 fl (ref 78.0–100.0)
Monocytes Absolute: 0.5 10*3/uL (ref 0.1–1.0)
Monocytes Relative: 6 % (ref 3.0–12.0)
Neutro Abs: 5.7 10*3/uL (ref 1.4–7.7)
Neutrophils Relative %: 66.1 % (ref 43.0–77.0)
Platelets: 222 10*3/uL (ref 150.0–400.0)
RBC: 3.88 Mil/uL (ref 3.87–5.11)
RDW: 15.4 % (ref 11.5–15.5)
WBC: 8.6 10*3/uL (ref 4.0–10.5)

## 2019-01-17 LAB — VITAMIN D 25 HYDROXY (VIT D DEFICIENCY, FRACTURES): VITD: 54.46 ng/mL (ref 30.00–100.00)

## 2019-01-17 LAB — LDL CHOLESTEROL, DIRECT: Direct LDL: 68 mg/dL

## 2019-01-17 LAB — TSH: TSH: 2.24 u[IU]/mL (ref 0.35–4.50)

## 2019-01-20 MED ORDER — ATORVASTATIN CALCIUM 20 MG PO TABS: 20.0000 mg | ORAL_TABLET | Freq: Every day | ORAL | 3 refills | Status: DC

## 2019-01-20 NOTE — Addendum Note (Signed)
Addended by: Magdalene Molly A on: 01/20/2019 03:44 PM   Modules accepted: Orders

## 2019-01-21 ENCOUNTER — Other Ambulatory Visit: Payer: Self-pay | Admitting: Internal Medicine

## 2019-02-03 DIAGNOSIS — R69 Illness, unspecified: Secondary | ICD-10-CM | POA: Diagnosis not present

## 2019-02-04 ENCOUNTER — Other Ambulatory Visit: Payer: Self-pay | Admitting: Internal Medicine

## 2019-02-04 ENCOUNTER — Telehealth: Payer: Self-pay

## 2019-02-04 NOTE — Telephone Encounter (Signed)
Copied from [place] [id number]. Topic: General - Other >> Feb 04, 2019 10:46 AM Rainey Pines A wrote: Patient would like a callback in regards to her  atorvastatin (LIPITOR) 20 MG tablet dosage and directions today. Patient stated that she called last week and hasnt heard back. Please advise    Spoke to patient and advised of how to take Atorvastatin daily instead of every other day as previous Rx was. Patient stated she understood and was a little worried about taking it every day. Assured patient of medication.

## 2019-02-12 NOTE — Progress Notes (Signed)
Virtual Visit via Audio Note  I connected with patient on 02/13/19 at  2:30 PM EST by audio enabled telemedicine application and verified that I am speaking with the correct person using two identifiers.   THIS ENCOUNTER IS A VIRTUAL VISIT DUE TO COVID-19 - PATIENT WAS NOT SEEN IN THE OFFICE. PATIENT HAS CONSENTED TO VIRTUAL VISIT / TELEMEDICINE VISIT   Location of patient: home  Location of provider: office  I discussed the limitations of evaluation and management by telemedicine and the availability of in person appointments. The patient expressed understanding and agreed to proceed.   Subjective:   Amanda Crawford is a 74 y.o. female who presents for an Initial Medicare Annual Wellness Visit. The Patient was informed that the wellness visit is to identify future health risk and educate and initiate measures that can reduce risk for increased disease through the lifespan.   Describes health as fair, good or great? good   Review of Systems    Home Safety/Smoke Alarms: Feels safe in home. Smoke alarms in place.  Lives w/ husband and dog in 2 story home. No trouble w stairs.   Female:   Mammo-ordered       Dexa scan-  02/07/17      CCS- 10/03/16 per abstract entry    Objective:      Advanced Directives 02/13/2019 08/21/2018 08/21/2018 08/21/2018 08/19/2018 07/27/2016 05/04/2016  Does Patient Have a Medical Advance Directive? Yes Yes Yes Yes Yes Yes Yes  Type of Paramedic of [place];Living will Living will Living will Living will Living will [place];Living will Living will  Does patient want to make changes to medical advance directive? No - Patient declined - No - Patient declined No - Patient declined - No - Patient declined -  Copy of [place] in Chart? Yes - validated most recent copy scanned in chart (See row information) - - - - No - copy requested -  Pre-existing out of facility DNR order (yellow form or pink MOST  form) - - - - - - -    Current Medications (verified) Outpatient Encounter Medications as of 02/13/2019  Medication Sig  . allopurinol (ZYLOPRIM) 300 MG tablet TAKE 1 TABLET BY MOUTH EVERY DAY  . aspirin EC 81 MG tablet Take 81 mg by mouth daily.  Marland Kitchen atorvastatin (LIPITOR) 20 MG tablet Take 1 tablet (20 mg total) by mouth daily.  . Calcium Carb-Cholecalciferol (CALCIUM 600 + D PO) Take 1 tablet by mouth daily.  . carvedilol (COREG) 6.25 MG tablet TAKE 1 TABLET (6.25 MG TOTAL) BY MOUTH 2 (TWO) TIMES DAILY. PT NEEDS AN OV FOR FUTURE REFILLS  . diphenhydramine-acetaminophen (TYLENOL PM) 25-500 MG TABS Take 1 tablet by mouth at bedtime as needed (sleep).   . furosemide (LASIX) 40 MG tablet TAKE 1 TABLET BY MOUTH EVERY DAY  . losartan (COZAAR) 25 MG tablet Take 1 tablet (25 mg total) by mouth daily.  . meclizine (ANTIVERT) 12.5 MG tablet Take 1 tablet (12.5 mg total) by mouth 3 (three) times daily as needed for dizziness. As needed for   Dizziness or nausea  . metFORMIN (GLUCOPHAGE) 500 MG tablet TAKE 1 TABLET BY MOUTH EVERY DAY  . nystatin cream (MYCOSTATIN) Apply 1 application topically 2 (two) times daily.  Glory Rosebush Delica Lancets 33I MISC USE AS DIRECTED TO TEST DAILY  . ONETOUCH VERIO test strip CEHCK BLOOD SUGAR DAILY AS NEEDED  . Vitamin D, Ergocalciferol, (DRISDOL) 1.25 MG (50000 UT)  CAPS capsule TAKE 1 CAPSULE (50,000 UNITS TOTAL) BY MOUTH EVERY 7 (SEVEN) DAYS.  . hyoscyamine (LEVSIN SL) 0.125 MG SL tablet Place 1 tablet (0.125 mg total) under the tongue every 4 (four) hours as needed. (Patient not taking: Reported on 02/13/2019)  . Omega-3 Fatty Acids (FISH OIL) 1000 MG CAPS Take 1 capsule (1,000 mg total) by mouth 2 (two) times daily. (Patient not taking: Reported on 02/13/2019)   No facility-administered encounter medications on file as of 02/13/2019.    Allergies (verified) Patient has no known allergies.   History: Past Medical History:  Diagnosis Date  . Allergic state  03/12/2015  . Arthritis   . Colon polyp 11/19/2014  . Dermatitis 03/12/2015  . Diarrhea 06/29/2016  . DM (diabetes mellitus), type 2 ([place]) 06/29/2016  . GERD (gastroesophageal reflux disease)    occ  . Gout 03/12/2015  . H/O measles   . H/O mumps   . Headache(784.0)    migraines  . History of chicken pox 11/23/2014  . Hyperglycemia 06/29/2016  . Lung cancer ([place])   . Migraine 11/23/2014  . Obesity 11/23/2014  . Pneumonia    child  . Preventative health care 09/12/2015  . Renal cell cancer ([place])    renal cell ca dx 9/01 and 8/08;  . Shortness of breath    occ  . Skin lesion of breast 03/12/2015   Past Surgical History:  Procedure Laterality Date  . ABDOMINAL HYSTERECTOMY    . APPENDECTOMY    . CARDIAC CATHETERIZATION     yrs ago neg  . CHOLECYSTECTOMY    . IR RADIOLOGIST EVAL & MGMT  07/17/2018  . IR RADIOLOGIST EVAL & MGMT  09/12/2018  . LUNG CANCER SURGERY Left 08  . RADIOLOGY WITH ANESTHESIA N/A 08/21/2018   Procedure: CT WITH ANESTHESIA RENAL CRYOABLATION;  Surgeon: Arne Cleveland, MD;  Location: [facility];  Service: Radiology;  Laterality: N/A;  . RENAL MASS EXCISION Left 01  . RIGHT/LEFT HEART CATH AND CORONARY ANGIOGRAPHY N/A 07/27/2016   Procedure: Right/Left Heart Cath and Coronary Angiography;  Surgeon: Leonie Man, MD;  Location: [facility];  Service: Cardiovascular;  Laterality: N/A;  . THORACOTOMY Left 05/09/2012   Procedure: THORACOTOMY MAJOR;  Surgeon: Gaye Pollack, MD;  Location: [place] [facility];  Service: Thoracic;  Laterality: Left;  Marland Kitchen VIDEO BRONCHOSCOPY N/A 05/09/2012   Procedure: VIDEO BRONCHOSCOPY;  Surgeon: Gaye Pollack, MD;  Location[facility] OR;  Service: Thoracic;  Laterality: N/A;  . WEDGE RESECTION Left 05/09/2012   Procedure: LEFT UPPER LOBE WEDGE RESECTION;  Surgeon: Gaye Pollack, MD;  Location: [facility] OR;  Service: Thoracic;  Laterality: Left;   Family History  Problem Relation Age of Onset  . Heart failure Father   . Diabetes Father   . Hyperlipidemia  Father   . Heart disease Father   . Hypertension Father   . Asthma Brother   . Asthma Daughter   . Cancer Paternal Grandmother   . Hearing loss Paternal Grandfather   . Stroke Paternal Grandfather   . Alcohol abuse Maternal Aunt    Social History   Socioeconomic History  . Marital status: Married    Spouse name: Not on file  . Number of children: Not on file  . Years of education: Not on file  . Highest education level: Not on file  Occupational History  . Not on file  Tobacco Use  . Smoking status: Former Smoker    Packs/day: 0.50    Years: 35.00  Pack years: 17.50    Types: Cigarettes    Quit date: 05/08/2006    Years since quitting: 12.7  . Smokeless tobacco: Never Used  Substance and Sexual Activity  . Alcohol use: No  . Drug use: No  . Sexual activity: Not on file    Comment: lives with husband, no dietary restrictions.   Other Topics Concern  . Not on file  Social History Narrative  . Not on file   Social Determinants of Health   Financial Resource Strain:   . Difficulty of Paying Living Expenses: Not on file  Food Insecurity:   . Worried About Charity fundraiser in the Last Year: Not on file  . Ran Out of Food in the Last Year: Not on file  Transportation Needs:   . Lack of Transportation (Medical): Not on file  . Lack of Transportation (Non-Medical): Not on file  Physical Activity:   . Days of Exercise per Week: Not on file  . Minutes of Exercise per Session: Not on file  Stress:   . Feeling of Stress : Not on file  Social Connections:   . Frequency of Communication with Friends and Family: Not on file  . Frequency of Social Gatherings with Friends and Family: Not on file  . Attends Religious Services: Not on file  . Active Member of Clubs or Organizations: Not on file  . Attends Archivist Meetings: Not on file  . Marital Status: Not on file    Tobacco Counseling Counseling given: Not Answered   Clinical Intake: Pain : No/denies  pain    Activities of Daily Living In your present state of health, do you have any difficulty performing the following activities: 02/13/2019 08/21/2018  Hearing? N N  Vision? N N  Difficulty concentrating or making decisions? N N  Walking or climbing stairs? N N  Dressing or bathing? N N  Doing errands, shopping? N N  Preparing Food and eating ? N -  Using the Toilet? N -  In the past six months, have you accidently leaked urine? N -  Do you have problems with loss of bowel control? N -  Managing your Medications? N -  Managing your Finances? N -  Housekeeping or managing your Housekeeping? N -  Some recent data might be hidden     Immunizations and Health Maintenance Immunization History  Administered Date(s) Administered  . Influenza, High Dose Seasonal PF 10/27/2016, 10/08/2017  . Influenza, Seasonal, Injecte, Preservative Fre 11/21/2014  . Influenza-Unspecified 11/21/2014  . Pneumococcal Conjugate-13 03/12/2015  . Pneumococcal Polysaccharide-23 10/23/2016  . Td 08/31/2015  . Tdap 08/31/2015   Health Maintenance Due  Topic Date Due  . FOOT EXAM  04/30/1955  . INFLUENZA VACCINE  08/31/2018    Patient Care Team: [name], MD as PCP - General (Family Medicine) Wyatt Portela, MD (Hematology and Oncology) Gaye Pollack, MD as Attending Physician (Cardiothoracic Surgery)  Indicate any recent Medical Services you may have received from other than [facility] providers in the past year (date may be approximate).     Assessment:   This is a routine wellness examination for [place]. Physical assessment deferred to PCP.  Hearing/Vision screen Unable to assess. This visit is enabled though telemedicine due to Covid 19.   Dietary issues and exercise activities discussed: Current Exercise Habits: Home exercise routine, Time (Minutes): 10, Frequency (Times/Week): 7, Weekly Exercise (Minutes/Week): 70, Intensity: Mild, Exercise limited by: None identified Diet (meal  preparation, eat out,  water intake, caffeinated beverages, dairy products, fruits and vegetables): well balanced     Goals    . Increase physical activity      Depression Screen PHQ 2/9 Scores 02/13/2019 06/07/2017 03/02/2016 02/18/2015 10/09/2013  PHQ - 2 Score 0 0 0 0 0    Fall Risk Fall Risk  02/13/2019 06/07/2017 03/02/2016 02/18/2015 10/09/2013  Falls in the past year? 0 No No No No  Number falls in past yr: 0 - - - -  Injury with Fall? 0 - - - -  Follow up Falls prevention discussed;Education provided - - - -   Cognitive Function:   Ad8 score reviewed for issues:  Issues making decisions:no  Less interest in hobbies / activities:no  Repeats questions, stories (family complaining):no  Trouble using ordinary gadgets (microwave, computer, phone):no  Forgets the month or year: no  Mismanaging finances: no  Remembering appts:no  Daily problems with thinking and/or memory:no Ad8 score is=0       Screening Tests Health Maintenance  Topic Date Due  . FOOT EXAM  04/30/1955  . INFLUENZA VACCINE  08/31/2018  . HEMOGLOBIN A1C  07/18/2019  . OPHTHALMOLOGY EXAM  07/18/2019  . TETANUS/TDAP  08/30/2025  . COLONOSCOPY  10/04/2026  . DEXA SCAN  Completed  . Hepatitis C Screening  Completed  . PNA vac Low Risk Adult  Completed       Plan:   See you next year!  Continue to eat heart healthy diet (full of fruits, vegetables, whole grains, lean protein, water--limit salt, fat, and sugar intake) and increase physical activity as tolerated.  Continue doing brain stimulating activities (puzzles, reading, adult coloring books, staying active) to keep memory sharp.   I have ordered your mammogram. Please schedule.   I have personally reviewed and noted the following in the patient's chart:   . Medical and social history . Use of alcohol, tobacco or illicit drugs  . Current medications and supplements . Functional ability and status . Nutritional status . Physical  activity . Advanced directives . List of other physicians . Hospitalizations, surgeries, and ER visits in previous 12 months . Vitals . Screenings to include cognitive, depression, and falls . Referrals and appointments  In addition, I have reviewed and discussed with patient certain preventive protocols, quality metrics, and best practice recommendations. A written personalized care plan for preventive services as well as general preventive health recommendations were provided to patient.     Shela Nevin, [place]   02/13/2019

## 2019-02-13 ENCOUNTER — Ambulatory Visit (INDEPENDENT_AMBULATORY_CARE_PROVIDER_SITE_OTHER): Payer: Medicare HMO | Admitting: *Deleted

## 2019-02-13 ENCOUNTER — Encounter: Payer: Self-pay | Admitting: *Deleted

## 2019-02-13 ENCOUNTER — Other Ambulatory Visit: Payer: Self-pay

## 2019-02-13 VITALS — BP 125/66 | HR 83 | Wt 209.5 lb

## 2019-02-13 DIAGNOSIS — Z0000 Encounter for general adult medical examination without abnormal findings: Secondary | ICD-10-CM | POA: Diagnosis not present

## 2019-02-13 DIAGNOSIS — Z1231 Encounter for screening mammogram for malignant neoplasm of breast: Secondary | ICD-10-CM

## 2019-02-13 NOTE — Patient Instructions (Signed)
See you next year!  Continue to eat heart healthy diet (full of fruits, vegetables, whole grains, lean protein, water--limit salt, fat, and sugar intake) and increase physical activity as tolerated.  Continue doing brain stimulating activities (puzzles, reading, adult coloring books, staying active) to keep memory sharp.   I have ordered your mammogram. Please schedule.   Amanda Crawford , Thank you for taking time to come for your Medicare Wellness Visit. I appreciate your ongoing commitment to your health goals. Please review the following plan we discussed and let me know if I can assist you in the future.   These are the goals we discussed: Goals    . Increase physical activity       This is a list of the screening recommended for you and due dates:  Health Maintenance  Topic Date Due  . Complete foot exam   04/30/1955  . Flu Shot  08/31/2018  . Hemoglobin A1C  07/18/2019  . Eye exam for diabetics  07/18/2019  . Tetanus Vaccine  08/30/2025  . Colon Cancer Screening  10/04/2026  . DEXA scan (bone density measurement)  Completed  .  Hepatitis C: One time screening is recommended by Center for Disease Control  (CDC) for  adults born from [birth year] through 1967.   Completed  . Pneumonia vaccines  Completed    Preventive Care 58 Years and Older, Female Preventive care refers to lifestyle choices and visits with your health care provider that can promote health and wellness. This includes:  A yearly physical exam. This is also called an annual well check.  Regular dental and eye exams.  Immunizations.  Screening for certain conditions.  Healthy lifestyle choices, such as diet and exercise. What can I expect for my preventive care visit? Physical exam Your health care provider will check:  Height and weight. These may be used to calculate body mass index (BMI), which is a measurement that tells if you are at a healthy weight.  Heart rate and blood pressure.  Your skin for  abnormal spots. Counseling Your health care provider may ask you questions about:  Alcohol, tobacco, and drug use.  Emotional well-being.  Home and relationship well-being.  Sexual activity.  Eating habits.  History of falls.  Memory and ability to understand (cognition).  Work and work Statistician.  Pregnancy and menstrual history. What immunizations do I need?  Influenza (flu) vaccine  This is recommended every year. Tetanus, diphtheria, and pertussis (Tdap) vaccine  You may need a Td booster every 10 years. Varicella (chickenpox) vaccine  You may need this vaccine if you have not already been vaccinated. Zoster (shingles) vaccine  You may need this after age 41. Pneumococcal conjugate (PCV13) vaccine  One dose is recommended after age 87. Pneumococcal polysaccharide (PPSV23) vaccine  One dose is recommended after age 75. Measles, mumps, and rubella (MMR) vaccine  You may need at least one dose of MMR if you were born in 1957 or later. You may also need a second dose. Meningococcal conjugate (MenACWY) vaccine  You may need this if you have certain conditions. Hepatitis A vaccine  You may need this if you have certain conditions or if you travel or work in places where you may be exposed to hepatitis A. Hepatitis B vaccine  You may need this if you have certain conditions or if you travel or work in places where you may be exposed to hepatitis B. Haemophilus influenzae type b (Hib) vaccine  You may need this if you  have certain conditions. You may receive vaccines as individual doses or as more than one vaccine together in one shot (combination vaccines). Talk with your health care provider about the risks and benefits of combination vaccines. What tests do I need? Blood tests  Lipid and cholesterol levels. These may be checked every 5 years, or more frequently depending on your overall health.  Hepatitis C test.  Hepatitis B test. Screening  Lung  cancer screening. You may have this screening every year starting at age 44 if you have a 30-pack-year history of smoking and currently smoke or have quit within the past 15 years.  Colorectal cancer screening. All adults should have this screening starting at age 24 and continuing until age 44. Your health care provider may recommend screening at age 50 if you are at increased risk. You will have tests every 1-10 years, depending on your results and the type of screening test.  Diabetes screening. This is done by checking your blood sugar (glucose) after you have not eaten for a while (fasting). You may have this done every 1-3 years.  Mammogram. This may be done every 1-2 years. Talk with your health care provider about how often you should have regular mammograms.  BRCA-related cancer screening. This may be done if you have a family history of breast, ovarian, tubal, or peritoneal cancers. Other tests  Sexually transmitted disease (STD) testing.  Bone density scan. This is done to screen for osteoporosis. You may have this done starting at age 27. Follow these instructions at home: Eating and drinking  Eat a diet that includes fresh fruits and vegetables, whole grains, lean protein, and low-fat dairy products. Limit your intake of foods with high amounts of sugar, saturated fats, and salt.  Take vitamin and mineral supplements as recommended by your health care provider.  Do not drink alcohol if your health care provider tells you not to drink.  If you drink alcohol: ? Limit how much you have to 0-1 drink a day. ? Be aware of how much alcohol is in your drink. In the [place] one drink equals one 12 oz bottle of beer (355 mL), one 5 oz glass of wine (148 mL), or one 1 oz glass of hard liquor (44 mL). Lifestyle  Take daily care of your teeth and gums.  Stay active. Exercise for at least 30 minutes on 5 or more days each week.  Do not use any products that contain nicotine or tobacco,  such as cigarettes, e-cigarettes, and chewing tobacco. If you need help quitting, ask your health care provider.  If you are sexually active, practice safe sex. Use a condom or other form of protection in order to prevent STIs (sexually transmitted infections).  Talk with your health care provider about taking a low-dose aspirin or statin. What's next?  Go to your health care provider once a year for a well check visit.  Ask your health care provider how often you should have your eyes and teeth checked.  Stay up to date on all vaccines. This information is not intended to replace advice given to you by your health care provider. Make sure you discuss any questions you have with your health care provider. Document Revised: 01/10/2018 Document Reviewed: 01/10/2018 Elsevier Patient Education  2020 [employer].

## 2019-02-20 ENCOUNTER — Other Ambulatory Visit: Payer: Self-pay

## 2019-02-20 ENCOUNTER — Ambulatory Visit (HOSPITAL_BASED_OUTPATIENT_CLINIC_OR_DEPARTMENT_OTHER)
Admission: RE | Admit: 2019-02-20 | Discharge: 2019-02-20 | Disposition: A | Discharge status: Home / Self Care | Payer: Medicare HMO | Source: Ambulatory Visit | Attending: Family Medicine | Admitting: Family Medicine

## 2019-02-20 DIAGNOSIS — Z1231 Encounter for screening mammogram for malignant neoplasm of breast: Secondary | ICD-10-CM | POA: Insufficient documentation

## 2019-02-24 ENCOUNTER — Telehealth: Payer: Self-pay

## 2019-02-24 ENCOUNTER — Other Ambulatory Visit: Payer: Self-pay | Admitting: Family Medicine

## 2019-02-24 DIAGNOSIS — R928 Other abnormal and inconclusive findings on diagnostic imaging of breast: Secondary | ICD-10-CM

## 2019-02-24 NOTE — Telephone Encounter (Signed)
PATIENT IS HAVING TROUBLE WITH HER MEDICATION, MEDICATION IS CAUSING THE PAT BLOOD SUGAR TO GO UP HIGH TO 160-180 RANGE. PATIENT WAS ADVISED TO CALL IF BLOOD SUGAR STAYED UP ABOVE 150.  Can someone please follow up with the patient at [phone] thanks

## 2019-02-26 NOTE — Telephone Encounter (Signed)
She needs an appointment to discuss medications and strategies for managing sugars

## 2019-02-27 NOTE — Telephone Encounter (Signed)
Please schedule patient an in person visit  Please advise

## 2019-03-03 ENCOUNTER — Encounter: Payer: Self-pay | Admitting: Family Medicine

## 2019-03-03 ENCOUNTER — Ambulatory Visit (INDEPENDENT_AMBULATORY_CARE_PROVIDER_SITE_OTHER): Payer: Medicare HMO | Admitting: Family Medicine

## 2019-03-03 ENCOUNTER — Other Ambulatory Visit: Payer: Self-pay

## 2019-03-03 DIAGNOSIS — R7989 Other specified abnormal findings of blood chemistry: Secondary | ICD-10-CM | POA: Diagnosis not present

## 2019-03-03 DIAGNOSIS — N289 Disorder of kidney and ureter, unspecified: Secondary | ICD-10-CM | POA: Diagnosis not present

## 2019-03-03 DIAGNOSIS — E785 Hyperlipidemia, unspecified: Secondary | ICD-10-CM | POA: Diagnosis not present

## 2019-03-03 DIAGNOSIS — E119 Type 2 diabetes mellitus without complications: Secondary | ICD-10-CM

## 2019-03-03 MED ORDER — ATORVASTATIN CALCIUM 20 MG PO TABS: 10.0000 mg | ORAL_TABLET | Freq: Every day | ORAL | 3 refills | Status: DC

## 2019-03-03 MED ORDER — METFORMIN HCL 500 MG PO TABS: 500.0000 mg | ORAL_TABLET | Freq: Two times a day (BID) | ORAL | 1 refills | Status: DC

## 2019-03-03 NOTE — Progress Notes (Signed)
Patient ID: Amanda Crawford, female   DOB: [birth year], 74 y.o.   MRN: [id number]   Subjective:    Patient ID: Amanda Crawford, female    DOB: November 01, 1945, 74 y.o.   MRN: [id number]  No chief complaint on file.   HPI Patient is in today for chronic medical concerns. Her greatest concern is her blood sugars have creeped up as we increased her Atorvastatin from 10 mg qod to 20 mg qd. Now numbers occasionally going over 200 but more routinely in the mid to high 100s. She feels she is trying to eat well and keep her carbs down. Denies CP/palp/SOB/HA/congestion/fevers/GI or GU c/o. Taking meds as prescribed  Past Medical History:  Diagnosis Date  . Allergic state 03/12/2015  . Arthritis   . Colon polyp 11/19/2014  . Dermatitis 03/12/2015  . Diarrhea 06/29/2016  . DM (diabetes mellitus), type 2 ([place]) 06/29/2016  . GERD (gastroesophageal reflux disease)    occ  . Gout 03/12/2015  . H/O measles   . H/O mumps   . Headache(784.0)    migraines  . History of chicken pox 11/23/2014  . Hyperglycemia 06/29/2016  . Lung cancer ([place])   . Migraine 11/23/2014  . Obesity 11/23/2014  . Pneumonia    child  . Preventative health care 09/12/2015  . Renal cell cancer ([place])    renal cell ca dx 9/01 and 8/08;  . Shortness of breath    occ  . Skin lesion of breast 03/12/2015    Past Surgical History:  Procedure Laterality Date  . ABDOMINAL HYSTERECTOMY    . APPENDECTOMY    . CARDIAC CATHETERIZATION     yrs ago neg  . CHOLECYSTECTOMY    . IR RADIOLOGIST EVAL & MGMT  07/17/2018  . IR RADIOLOGIST EVAL & MGMT  09/12/2018  . LUNG CANCER SURGERY Left 08  . RADIOLOGY WITH ANESTHESIA N/A 08/21/2018   Procedure: CT WITH ANESTHESIA RENAL CRYOABLATION;  Surgeon: Arne Cleveland, MD;  Location: [facility];  Service: Radiology;  Laterality: N/A;  . RENAL MASS EXCISION Left 01  . RIGHT/LEFT HEART CATH AND CORONARY ANGIOGRAPHY N/A 07/27/2016   Procedure: Right/Left Heart Cath and Coronary Angiography;  Surgeon: Leonie Man, MD;  Location: [facility];  Service: Cardiovascular;  Laterality: N/A;  . THORACOTOMY Left 05/09/2012   Procedure: THORACOTOMY MAJOR;  Surgeon: Gaye Pollack, MD;  Location: [facility];  Service: Thoracic;  Laterality: Left;  Marland Kitchen VIDEO BRONCHOSCOPY N/A 05/09/2012   Procedure: VIDEO BRONCHOSCOPY;  Surgeon: Gaye Pollack, MD;  Location: [facility];  Service: Thoracic;  Laterality: N/A;  . WEDGE RESECTION Left 05/09/2012   Procedure: LEFT UPPER LOBE WEDGE RESECTION;  Surgeon: Gaye Pollack, MD;  Location: [facility];  Service: Thoracic;  Laterality: Left;    Family History  Problem Relation Age of Onset  . Heart failure Father   . Diabetes Father   . Hyperlipidemia Father   . Heart disease Father   . Hypertension Father   . Asthma Brother   . Asthma Daughter   . Cancer Paternal Grandmother   . Hearing loss Paternal Grandfather   . Stroke Paternal Grandfather   . Alcohol abuse Maternal Aunt     Social History   Socioeconomic History  . Marital status: Married    Spouse name: Not on file  . Number of children: Not on file  . Years of education: Not on file  . Highest education level: Not on file  Occupational History  .  Not on file  Tobacco Use  . Smoking status: Former Smoker    Packs/day: 0.50    Years: 35.00    Pack years: 17.50    Types: Cigarettes    Quit date: 05/08/2006    Years since quitting: 12.8  . Smokeless tobacco: Never Used  Substance and Sexual Activity  . Alcohol use: No  . Drug use: No  . Sexual activity: Not on file    Comment: lives with husband, no dietary restrictions.   Other Topics Concern  . Not on file  Social History Narrative  . Not on file   Social Determinants of Health   Financial Resource Strain:   . Difficulty of Paying Living Expenses: Not on file  Food Insecurity:   . Worried About Charity fundraiser in the Last Year: Not on file  . Ran Out of Food in the Last Year: Not on file  Transportation Needs:   . Lack of Transportation  (Medical): Not on file  . Lack of Transportation (Non-Medical): Not on file  Physical Activity:   . Days of Exercise per Week: Not on file  . Minutes of Exercise per Session: Not on file  Stress:   . Feeling of Stress : Not on file  Social Connections:   . Frequency of Communication with Friends and Family: Not on file  . Frequency of Social Gatherings with Friends and Family: Not on file  . Attends Religious Services: Not on file  . Active Member of Clubs or Organizations: Not on file  . Attends Archivist Meetings: Not on file  . Marital Status: Not on file  Intimate Partner Violence:   . Fear of Current or Ex-Partner: Not on file  . Emotionally Abused: Not on file  . Physically Abused: Not on file  . Sexually Abused: Not on file    Outpatient Medications Prior to Visit  Medication Sig Dispense Refill  . allopurinol (ZYLOPRIM) 300 MG tablet TAKE 1 TABLET BY MOUTH EVERY DAY 90 tablet 1  . aspirin EC 81 MG tablet Take 81 mg by mouth daily.    . Calcium Carb-Cholecalciferol (CALCIUM 600 + D PO) Take 1 tablet by mouth daily.    . carvedilol (COREG) 6.25 MG tablet TAKE 1 TABLET (6.25 MG TOTAL) BY MOUTH 2 (TWO) TIMES DAILY. PT NEEDS AN OV FOR FUTURE REFILLS 30 tablet 5  . diphenhydramine-acetaminophen (TYLENOL PM) 25-500 MG TABS Take 1 tablet by mouth at bedtime as needed (sleep).     . furosemide (LASIX) 40 MG tablet TAKE 1 TABLET BY MOUTH EVERY DAY 90 tablet 0  . hyoscyamine (LEVSIN SL) 0.125 MG SL tablet Place 1 tablet (0.125 mg total) under the tongue every 4 (four) hours as needed. (Patient not taking: Reported on 02/13/2019) 30 tablet 1  . losartan (COZAAR) 25 MG tablet Take 1 tablet (25 mg total) by mouth daily. 90 tablet 1  . meclizine (ANTIVERT) 12.5 MG tablet Take 1 tablet (12.5 mg total) by mouth 3 (three) times daily as needed for dizziness. As needed for   Dizziness or nausea 30 tablet 1  . nystatin cream (MYCOSTATIN) Apply 1 application topically 2 (two) times  daily. 30 g 1  . Omega-3 Fatty Acids (FISH OIL) 1000 MG CAPS Take 1 capsule (1,000 mg total) by mouth 2 (two) times daily. (Patient not taking: Reported on 02/13/2019)  0  . OneTouch Delica Lancets 41C MISC USE AS DIRECTED TO TEST DAILY 100 each 2  . ONETOUCH VERIO test  strip CEHCK BLOOD SUGAR DAILY AS NEEDED 100 strip 2  . Vitamin D, Ergocalciferol, (DRISDOL) 1.25 MG (50000 UT) CAPS capsule TAKE 1 CAPSULE (50,000 UNITS TOTAL) BY MOUTH EVERY 7 (SEVEN) DAYS. 12 capsule 1  . atorvastatin (LIPITOR) 20 MG tablet Take 1 tablet (20 mg total) by mouth daily. 90 tablet 3  . metFORMIN (GLUCOPHAGE) 500 MG tablet TAKE 1 TABLET BY MOUTH EVERY DAY 90 tablet 1   No facility-administered medications prior to visit.    No Known Allergies  Review of Systems  Constitutional: Negative for fever and malaise/fatigue.  HENT: Negative for congestion.   Eyes: Negative for blurred vision.  Respiratory: Negative for shortness of breath.   Cardiovascular: Negative for chest pain, palpitations and leg swelling.  Gastrointestinal: Negative for abdominal pain, blood in stool and nausea.  Genitourinary: Negative for dysuria and frequency.  Musculoskeletal: Negative for falls.  Skin: Negative for rash.  Neurological: Negative for dizziness, loss of consciousness and headaches.  Endo/Heme/Allergies: Negative for environmental allergies.  Psychiatric/Behavioral: Negative for depression. The patient is not nervous/anxious.        Objective:    Physical Exam Vitals and nursing note reviewed.  Constitutional:      General: She is not in acute distress.    Appearance: She is well-developed.  HENT:     Head: Normocephalic and atraumatic.     Nose: Nose normal.  Eyes:     General:        Right eye: No discharge.        Left eye: No discharge.  Cardiovascular:     Rate and Rhythm: Normal rate and regular rhythm.     Heart sounds: No murmur.  Pulmonary:     Effort: Pulmonary effort is normal.     Breath sounds:  Normal breath sounds.  Abdominal:     General: Bowel sounds are normal.     Palpations: Abdomen is soft.     Tenderness: There is no abdominal tenderness.  Musculoskeletal:     Cervical back: Normal range of motion and neck supple.  Skin:    General: Skin is warm and dry.  Neurological:     Mental Status: She is alert and oriented to person, place, and time.     BP 134/60 (BP Location: Left Arm, Patient Position: Sitting, Cuff Size: Normal)   Pulse 87   Temp 98.3 F (36.8 C) (Temporal)   Resp 18   Wt 211 lb 9.6 oz (96 kg)   SpO2 95%   BMI 36.32 kg/m  Wt Readings from Last 3 Encounters:  03/03/19 211 lb 9.6 oz (96 kg)  02/13/19 209 lb 8 oz (95 kg)  01/15/19 209 lb (94.8 kg)    Diabetic Foot Exam - Simple   No data filed     Lab Results  Component Value Date   WBC 8.6 01/17/2019   HGB 12.4 01/17/2019   HCT 38.1 01/17/2019   PLT 222.0 01/17/2019   GLUCOSE 146 (H) 01/17/2019   CHOL 169 01/17/2019   TRIG 261.0 (H) 01/17/2019   HDL 45.90 01/17/2019   LDLDIRECT 68.0 01/17/2019   LDLCALC (H) 01/31/2007    117        Total Cholesterol/HDL:CHD Risk Coronary Heart Disease Risk Table                     Men   Women  1/2 Average Risk   3.4   3.3  Average Risk       5.0  4.4  2 X Average Risk   9.6   7.1  3 X Average Risk  23.4   11.0        Use the calculated Patient Ratio above and the CHD Risk Table to determine the patient's CHD Risk.        ATP III CLASSIFICATION (LDL):  <100     mg/dL   Optimal  100-129  mg/dL   Near or Above                    Optimal  130-159  mg/dL   Borderline  160-189  mg/dL   High  >190     mg/dL   Very High   ALT 41 (H) 01/17/2019   AST 50 (H) 01/17/2019   NA 136 01/17/2019   K 4.5 01/17/2019   CL 101 01/17/2019   CREATININE 1.39 (H) 01/17/2019   BUN 34 (H) 01/17/2019   CO2 27 01/17/2019   TSH 2.24 01/17/2019   INR 1.1 08/19/2018   HGBA1C 6.7 (H) 01/17/2019    Lab Results  Component Value Date   TSH 2.24 01/17/2019    Lab Results  Component Value Date   WBC 8.6 01/17/2019   HGB 12.4 01/17/2019   HCT 38.1 01/17/2019   MCV 98.1 01/17/2019   PLT 222.0 01/17/2019   Lab Results  Component Value Date   NA 136 01/17/2019   K 4.5 01/17/2019   CHLORIDE 104 11/01/2016   CO2 27 01/17/2019   GLUCOSE 146 (H) 01/17/2019   BUN 34 (H) 01/17/2019   CREATININE 1.39 (H) 01/17/2019   BILITOT 0.6 01/17/2019   ALKPHOS 61 01/17/2019   AST 50 (H) 01/17/2019   ALT 41 (H) 01/17/2019   PROT 7.8 01/17/2019   ALBUMIN 4.2 01/17/2019   CALCIUM 10.5 01/17/2019   ANIONGAP 12 08/22/2018   EGFR 50 (L) 11/01/2016   GFR 36.89 (L) 01/17/2019   Lab Results  Component Value Date   CHOL 169 01/17/2019   Lab Results  Component Value Date   HDL 45.90 01/17/2019   Lab Results  Component Value Date   LDLCALC (H) 01/31/2007    117        Total Cholesterol/HDL:CHD Risk Coronary Heart Disease Risk Table                     Men   Women  1/2 Average Risk   3.4   3.3  Average Risk       5.0   4.4  2 X Average Risk   9.6   7.1  3 X Average Risk  23.4   11.0        Use the calculated Patient Ratio above and the CHD Risk Table to determine the patient's CHD Risk.        ATP III CLASSIFICATION (LDL):  <100     mg/dL   Optimal  100-129  mg/dL   Near or Above                    Optimal  130-159  mg/dL   Borderline  160-189  mg/dL   High  >190     mg/dL   Very High   Lab Results  Component Value Date   TRIG 261.0 (H) 01/17/2019   Lab Results  Component Value Date   CHOLHDL 4 01/17/2019   Lab Results  Component Value Date   HGBA1C 6.7 (H) 01/17/2019  Assessment & Plan:   Problem List Items Addressed This Visit    Hyperlipidemia    Will drop the Atorvastatin from 20 to 10 mg qhs and reassess      Relevant Medications   atorvastatin (LIPITOR) 20 MG tablet   Low serum vitamin D    Supplement and monitor      DM (diabetes mellitus), type 2 ([place])    1/23 202, 1/23 160, 1/26 140, 1/27 170  In past  week highest number 12/30 215, lowest 117 on 12/19 1/30 181 This am 162 She feels this worsened when we increased her Atorvastatin from 10 mg po qod to 20 mg daily, will try 10 mg daily and reassess      Relevant Medications   atorvastatin (LIPITOR) 20 MG tablet   metFORMIN (GLUCOPHAGE) 500 MG tablet   Renal insufficiency    Hydrate and monitor         I have changed Ileana Ladd. Hodges's atorvastatin and metFORMIN. I am also having her maintain her diphenhydramine-acetaminophen, meclizine, hyoscyamine, aspirin EC, Fish Oil, nystatin cream, Calcium Carb-Cholecalciferol (CALCIUM 600 + D PO), Vitamin D (Ergocalciferol), furosemide, OneTouch Verio, allopurinol, OneTouch Delica Lancets 99J, losartan, and carvedilol.  Meds ordered this encounter  Medications  . atorvastatin (LIPITOR) 20 MG tablet    Sig: Take 0.5 tablets (10 mg total) by mouth daily.    Dispense:  90 tablet    Refill:  3    D/ c prev script  . metFORMIN (GLUCOPHAGE) 500 MG tablet    Sig: Take 1 tablet (500 mg total) by mouth 2 (two) times daily with a meal.    Dispense:  180 tablet    Refill:  1     Penni Homans, MD

## 2019-03-03 NOTE — Assessment & Plan Note (Signed)
1/23 202, 1/23 160, 1/26 140, 1/27 170  In past week highest number 12/30 215, lowest 117 on 12/19 1/30 181 This am 162 She feels this worsened when we increased her Atorvastatin from 10 mg po qod to 20 mg daily, will try 10 mg daily and reassess

## 2019-03-03 NOTE — Patient Instructions (Signed)
[email or web address]  Multivitamin with minerals, with selenium Vitamin D 1000 IU daily Probiotic daily  Aspirin EC 81 mg daily  Melatonin 2-5 mg at bedtime Carbohydrate Counting for Diabetes Mellitus, Adult  Carbohydrate counting is a method of keeping track of how many carbohydrates you eat. Eating carbohydrates naturally increases the amount of sugar (glucose) in the blood. Counting how many carbohydrates you eat helps keep your blood glucose within normal limits, which helps you manage your diabetes (diabetes mellitus). It is important to know how many carbohydrates you can safely have in each meal. This is different for every person. A diet and nutrition specialist (registered dietitian) can help you make a meal plan and calculate how many carbohydrates you should have at each meal and snack. Carbohydrates are found in the following foods:  Grains, such as breads and cereals.  Dried beans and soy products.  Starchy vegetables, such as potatoes, peas, and corn.  Fruit and fruit juices.  Milk and yogurt.  Sweets and snack foods, such as cake, cookies, candy, chips, and soft drinks. How do I count carbohydrates? There are two ways to count carbohydrates in food. You can use either of the methods or a combination of both. Reading "Nutrition Facts" on packaged food The "Nutrition Facts" list is included on the labels of almost all packaged foods and beverages in the [place] It includes:  The serving size.  Information about nutrients in each serving, including the grams (g) of carbohydrate per serving. To use the "Nutrition Facts":  Decide how many servings you will have.  Multiply the number of servings by the number of carbohydrates per serving.  The resulting number is the total amount of carbohydrates that you will be having. Learning standard serving sizes of other foods When you eat carbohydrate foods that are not packaged or do not  include "Nutrition Facts" on the label, you need to measure the servings in order to count the amount of carbohydrates:  Measure the foods that you will eat with a food scale or measuring cup, if needed.  Decide how many standard-size servings you will eat.  Multiply the number of servings by 15. Most carbohydrate-rich foods have about 15 g of carbohydrates per serving. ? For example, if you eat 8 oz (170 g) of strawberries, you will have eaten 2 servings and 30 g of carbohydrates (2 servings x 15 g = 30 g).  For foods that have more than one food mixed, such as soups and casseroles, you must count the carbohydrates in each food that is included. The following list contains standard serving sizes of common carbohydrate-rich foods. Each of these servings has about 15 g of carbohydrates:   hamburger bun or  English muffin.   oz (15 mL) syrup.   oz (14 g) jelly.  1 slice of bread.  1 six-inch tortilla.  3 oz (85 g) cooked rice or pasta.  4 oz (113 g) cooked dried beans.  4 oz (113 g) starchy vegetable, such as peas, corn, or potatoes.  4 oz (113 g) hot cereal.  4 oz (113 g) mashed potatoes or  of a large baked potato.  4 oz (113 g) canned or frozen fruit.  4 oz (120 mL) fruit juice.  4-6 crackers.  6 chicken nuggets.  6 oz (170 g) unsweetened dry cereal.  6 oz (170 g) plain fat-free yogurt or yogurt sweetened with artificial sweeteners.  8 oz (240 mL) milk.  8 oz (170 g) fresh fruit or one small piece  of fruit.  24 oz (680 g) popped popcorn. Example of carbohydrate counting Sample meal  3 oz (85 g) chicken breast.  6 oz (170 g) brown rice.  4 oz (113 g) corn.  8 oz (240 mL) milk.  8 oz (170 g) strawberries with sugar-free whipped topping. Carbohydrate calculation 1. Identify the foods that contain carbohydrates: ? Rice. ? Corn. ? Milk. ? Strawberries. 2. Calculate how many servings you have of each food: ? 2 servings rice. ? 1 serving corn. ? 1  serving milk. ? 1 serving strawberries. 3. Multiply each number of servings by 15 g: ? 2 servings rice x 15 g = 30 g. ? 1 serving corn x 15 g = 15 g. ? 1 serving milk x 15 g = 15 g. ? 1 serving strawberries x 15 g = 15 g. 4. Add together all of the amounts to find the total grams of carbohydrates eaten: ? 30 g + 15 g + 15 g + 15 g = 75 g of carbohydrates total. Summary  Carbohydrate counting is a method of keeping track of how many carbohydrates you eat.  Eating carbohydrates naturally increases the amount of sugar (glucose) in the blood.  Counting how many carbohydrates you eat helps keep your blood glucose within normal limits, which helps you manage your diabetes.  A diet and nutrition specialist (registered dietitian) can help you make a meal plan and calculate how many carbohydrates you should have at each meal and snack. This information is not intended to replace advice given to you by your health care provider. Make sure you discuss any questions you have with your health care provider. Document Revised: 08/10/2016 Document Reviewed: 06/30/2015 Elsevier Patient Education  [place].

## 2019-03-03 NOTE — Assessment & Plan Note (Signed)
Will drop the Atorvastatin from 20 to 10 mg qhs and reassess

## 2019-03-03 NOTE — Assessment & Plan Note (Signed)
Hydrate and monitor 

## 2019-03-03 NOTE — Assessment & Plan Note (Signed)
Supplement and monitor 

## 2019-03-04 ENCOUNTER — Other Ambulatory Visit: Payer: Self-pay | Admitting: Oncology

## 2019-03-04 ENCOUNTER — Ambulatory Visit
Admission: RE | Admit: 2019-03-04 | Discharge: 2019-03-04 | Disposition: A | Discharge status: Home / Self Care | Payer: Medicare HMO | Source: Ambulatory Visit | Attending: Family Medicine | Admitting: Family Medicine

## 2019-03-04 DIAGNOSIS — R928 Other abnormal and inconclusive findings on diagnostic imaging of breast: Secondary | ICD-10-CM

## 2019-03-04 DIAGNOSIS — R921 Mammographic calcification found on diagnostic imaging of breast: Secondary | ICD-10-CM

## 2019-03-13 ENCOUNTER — Other Ambulatory Visit: Payer: Self-pay | Admitting: Family Medicine

## 2019-03-17 DIAGNOSIS — R69 Illness, unspecified: Secondary | ICD-10-CM | POA: Diagnosis not present

## 2019-03-18 ENCOUNTER — Other Ambulatory Visit: Payer: Self-pay | Admitting: Internal Medicine

## 2019-03-18 DIAGNOSIS — R69 Illness, unspecified: Secondary | ICD-10-CM | POA: Diagnosis not present

## 2019-03-19 ENCOUNTER — Other Ambulatory Visit: Payer: Self-pay | Admitting: Family Medicine

## 2019-03-19 NOTE — Telephone Encounter (Signed)
This was discontinued by provider/thx dmf

## 2019-03-24 ENCOUNTER — Other Ambulatory Visit: Payer: Self-pay

## 2019-03-25 ENCOUNTER — Encounter: Payer: Self-pay | Admitting: Family Medicine

## 2019-03-25 ENCOUNTER — Other Ambulatory Visit: Payer: Self-pay | Admitting: Family Medicine

## 2019-03-25 ENCOUNTER — Ambulatory Visit (INDEPENDENT_AMBULATORY_CARE_PROVIDER_SITE_OTHER): Payer: Medicare HMO | Admitting: Family Medicine

## 2019-03-25 VITALS — BP 108/64 | HR 81 | Temp 96.8°F | Ht 64.0 in | Wt 210.5 lb

## 2019-03-25 DIAGNOSIS — R7989 Other specified abnormal findings of blood chemistry: Secondary | ICD-10-CM | POA: Diagnosis not present

## 2019-03-25 DIAGNOSIS — R109 Unspecified abdominal pain: Secondary | ICD-10-CM | POA: Diagnosis not present

## 2019-03-25 DIAGNOSIS — N289 Disorder of kidney and ureter, unspecified: Secondary | ICD-10-CM

## 2019-03-25 DIAGNOSIS — K5792 Diverticulitis of intestine, part unspecified, without perforation or abscess without bleeding: Secondary | ICD-10-CM | POA: Diagnosis not present

## 2019-03-25 DIAGNOSIS — E119 Type 2 diabetes mellitus without complications: Secondary | ICD-10-CM | POA: Diagnosis not present

## 2019-03-25 LAB — CBC WITH DIFFERENTIAL/PLATELET
Basophils Absolute: 0 10*3/uL (ref 0.0–0.1)
Basophils Relative: 0.5 % (ref 0.0–3.0)
Eosinophils Absolute: 0.3 10*3/uL (ref 0.0–0.7)
Eosinophils Relative: 2.4 % (ref 0.0–5.0)
HCT: 38 % (ref 36.0–46.0)
Hemoglobin: 12.4 g/dL (ref 12.0–15.0)
Lymphocytes Relative: 25.2 % (ref 12.0–46.0)
Lymphs Abs: 2.6 10*3/uL (ref 0.7–4.0)
MCHC: 32.6 g/dL (ref 30.0–36.0)
MCV: 98.2 fl (ref 78.0–100.0)
Monocytes Absolute: 0.7 10*3/uL (ref 0.1–1.0)
Monocytes Relative: 6.9 % (ref 3.0–12.0)
Neutro Abs: 6.8 10*3/uL (ref 1.4–7.7)
Neutrophils Relative %: 65 % (ref 43.0–77.0)
Platelets: 205 10*3/uL (ref 150.0–400.0)
RBC: 3.87 Mil/uL (ref 3.87–5.11)
RDW: 16.1 % — ABNORMAL HIGH (ref 11.5–15.5)
WBC: 10.4 10*3/uL (ref 4.0–10.5)

## 2019-03-25 LAB — COMPREHENSIVE METABOLIC PANEL
ALT: 43 U/L — ABNORMAL HIGH (ref 0–35)
AST: 50 U/L — ABNORMAL HIGH (ref 0–37)
Albumin: 4.4 g/dL (ref 3.5–5.2)
Alkaline Phosphatase: 64 U/L (ref 39–117)
BUN: 27 mg/dL — ABNORMAL HIGH (ref 6–23)
CO2: 28 mEq/L (ref 19–32)
Calcium: 10.7 mg/dL — ABNORMAL HIGH (ref 8.4–10.5)
Chloride: 100 mEq/L (ref 96–112)
Creatinine, Ser: 1.29 mg/dL — ABNORMAL HIGH (ref 0.40–1.20)
GFR: 40.19 mL/min — ABNORMAL LOW (ref >60.00)
Glucose, Bld: 127 mg/dL — ABNORMAL HIGH (ref 70–99)
Potassium: 4.8 mEq/L (ref 3.5–5.1)
Sodium: 137 mEq/L (ref 135–145)
Total Bilirubin: 1 mg/dL (ref 0.2–1.2)
Total Protein: 7.6 g/dL (ref 6.0–8.3)

## 2019-03-25 MED ORDER — GLYBURIDE-METFORMIN 1.25-250 MG PO TABS: 1.0000 | ORAL_TABLET | Freq: Two times a day (BID) | ORAL | 3 refills | Status: DC

## 2019-03-25 MED ORDER — METRONIDAZOLE 375 MG PO CAPS: 375.0000 mg | ORAL_CAPSULE | Freq: Three times a day (TID) | ORAL | 0 refills | Status: DC

## 2019-03-25 MED ORDER — CIPROFLOXACIN HCL 500 MG PO TABS: 500.0000 mg | ORAL_TABLET | Freq: Two times a day (BID) | ORAL | 0 refills | Status: DC

## 2019-03-26 NOTE — Assessment & Plan Note (Signed)
She reports symptoms similar to a previous episode of Diverticulitis. Start a probiotic.

## 2019-03-26 NOTE — Assessment & Plan Note (Signed)
hgba1c acceptable, minimize simple carbs. Increase exercise as tolerated. Continue current meds 

## 2019-03-26 NOTE — Assessment & Plan Note (Signed)
Hydrate and monitor 

## 2019-03-26 NOTE — Assessment & Plan Note (Signed)
Supplement and monitor 

## 2019-03-26 NOTE — Progress Notes (Signed)
Subjective:    Patient ID: Amanda Crawford, female    DOB: 1945/12/18, 74 y.o.   MRN: [id number]  Chief Complaint  Patient presents with  . Follow-up    HPI Patient is in today for follow up on chronic medical concerns. Her trouble with abdominal discomfort is some improved . She does not note any new symptoms. No bloody or tarry stool. She has gotten her fist covid shot. Her sugars have been ranging from 15 to 238. No polyuria or polydipsia. She tries to maintain quarantine.   Past Medical History:  Diagnosis Date  . Allergic state 03/12/2015  . Arthritis   . Colon polyp 11/19/2014  . Dermatitis 03/12/2015  . Diarrhea 06/29/2016  . DM (diabetes mellitus), type 2 ([place]) 06/29/2016  . GERD (gastroesophageal reflux disease)    occ  . Gout 03/12/2015  . H/O measles   . H/O mumps   . Headache(784.0)    migraines  . History of chicken pox 11/23/2014  . Hyperglycemia 06/29/2016  . Lung cancer ([place])   . Migraine 11/23/2014  . Obesity 11/23/2014  . Pneumonia    child  . Preventative health care 09/12/2015  . Renal cell cancer ([place])    renal cell ca dx 9/01 and 8/08;  . Shortness of breath    occ  . Skin lesion of breast 03/12/2015    Past Surgical History:  Procedure Laterality Date  . ABDOMINAL HYSTERECTOMY    . APPENDECTOMY    . CARDIAC CATHETERIZATION     yrs ago neg  . CHOLECYSTECTOMY    . IR RADIOLOGIST EVAL & MGMT  07/17/2018  . IR RADIOLOGIST EVAL & MGMT  09/12/2018  . LUNG CANCER SURGERY Left 08  . RADIOLOGY WITH ANESTHESIA N/A 08/21/2018   Procedure: CT WITH ANESTHESIA RENAL CRYOABLATION;  Surgeon: Arne Cleveland, MD;  Location: [facility];  Service: Radiology;  Laterality: N/A;  . RENAL MASS EXCISION Left 01  . RIGHT/LEFT HEART CATH AND CORONARY ANGIOGRAPHY N/A 07/27/2016   Procedure: Right/Left Heart Cath and Coronary Angiography;  Surgeon: Leonie Man, MD;  Location: [facility];  Service: Cardiovascular;  Laterality: N/A;  . THORACOTOMY Left 05/09/2012   Procedure: THORACOTOMY MAJOR;  Surgeon: Gaye Pollack, MD;  Location: [facility];  Service: Thoracic;  Laterality: Left;  Marland Kitchen VIDEO BRONCHOSCOPY N/A 05/09/2012   Procedure: VIDEO BRONCHOSCOPY;  Surgeon: Gaye Pollack, MD;  Location: [facility];  Service: Thoracic;  Laterality: N/A;  . WEDGE RESECTION Left 05/09/2012   Procedure: LEFT UPPER LOBE WEDGE RESECTION;  Surgeon: Gaye Pollack, MD;  Location: [facility];  Service: Thoracic;  Laterality: Left;    Family History  Problem Relation Age of Onset  . Heart failure Father   . Diabetes Father   . Hyperlipidemia Father   . Heart disease Father   . Hypertension Father   . Asthma Brother   . Asthma Daughter   . Cancer Paternal Grandmother   . Hearing loss Paternal Grandfather   . Stroke Paternal Grandfather   . Alcohol abuse Maternal Aunt     Social History   Socioeconomic History  . Marital status: Married    Spouse name: Not on file  . Number of children: Not on file  . Years of education: Not on file  . Highest education level: Not on file  Occupational History  . Not on file  Tobacco Use  . Smoking status: Former Smoker    Packs/day: 0.50    Years: 35.00  Pack years: 17.50    Types: Cigarettes    Quit date: 05/08/2006    Years since quitting: 12.8  . Smokeless tobacco: Never Used  Substance and Sexual Activity  . Alcohol use: No  . Drug use: No  . Sexual activity: Not on file    Comment: lives with husband, no dietary restrictions.   Other Topics Concern  . Not on file  Social History Narrative  . Not on file   Social Determinants of Health   Financial Resource Strain:   . Difficulty of Paying Living Expenses: Not on file  Food Insecurity:   . Worried About Charity fundraiser in the Last Year: Not on file  . Ran Out of Food in the Last Year: Not on file  Transportation Needs:   . Lack of Transportation (Medical): Not on file  . Lack of Transportation (Non-Medical): Not on file  Physical Activity:   . Days of Exercise  per Week: Not on file  . Minutes of Exercise per Session: Not on file  Stress:   . Feeling of Stress : Not on file  Social Connections:   . Frequency of Communication with Friends and Family: Not on file  . Frequency of Social Gatherings with Friends and Family: Not on file  . Attends Religious Services: Not on file  . Active Member of Clubs or Organizations: Not on file  . Attends Archivist Meetings: Not on file  . Marital Status: Not on file  Intimate Partner Violence:   . Fear of Current or Ex-Partner: Not on file  . Emotionally Abused: Not on file  . Physically Abused: Not on file  . Sexually Abused: Not on file    Outpatient Medications Prior to Visit  Medication Sig Dispense Refill  . allopurinol (ZYLOPRIM) 300 MG tablet TAKE 1 TABLET BY MOUTH EVERY DAY 90 tablet 1  . aspirin EC 81 MG tablet Take 81 mg by mouth daily.    Marland Kitchen atorvastatin (LIPITOR) 20 MG tablet Take 0.5 tablets (10 mg total) by mouth daily. 90 tablet 3  . Calcium Carb-Cholecalciferol (CALCIUM 600 + D PO) Take 1 tablet by mouth daily.    . carvedilol (COREG) 6.25 MG tablet TAKE 1 TABLET (6.25 MG TOTAL) BY MOUTH 2 (TWO) TIMES DAILY. PT NEEDS AN OV FOR FUTURE REFILLS 30 tablet 5  . diphenhydramine-acetaminophen (TYLENOL PM) 25-500 MG TABS Take 1 tablet by mouth at bedtime as needed (sleep).     . furosemide (LASIX) 40 MG tablet TAKE 1 TABLET BY MOUTH EVERY DAY 90 tablet 1  . losartan (COZAAR) 25 MG tablet Take 1 tablet (25 mg total) by mouth daily. 90 tablet 1  . meclizine (ANTIVERT) 12.5 MG tablet Take 1 tablet (12.5 mg total) by mouth 3 (three) times daily as needed for dizziness. As needed for   Dizziness or nausea 30 tablet 1  . nystatin cream (MYCOSTATIN) Apply 1 application topically 2 (two) times daily. 30 g 1  . Omega-3 Fatty Acids (FISH OIL) 1000 MG CAPS Take 1 capsule (1,000 mg total) by mouth 2 (two) times daily.  0  . OneTouch Delica Lancets 62G MISC USE AS DIRECTED TO TEST DAILY 100 each 2  .  ONETOUCH VERIO test strip CEHCK BLOOD SUGAR DAILY AS NEEDED 100 strip 2  . Vitamin D, Ergocalciferol, (DRISDOL) 1.25 MG (50000 UNIT) CAPS capsule TAKE 1 CAPSULE (50,000 UNITS TOTAL) BY MOUTH EVERY 7 (SEVEN) DAYS. 12 capsule 1  . hyoscyamine (LEVSIN SL) 0.125 MG SL  tablet Place 1 tablet (0.125 mg total) under the tongue every 4 (four) hours as needed. 30 tablet 1  . metFORMIN (GLUCOPHAGE) 500 MG tablet Take 1 tablet (500 mg total) by mouth 2 (two) times daily with a meal. 180 tablet 1   No facility-administered medications prior to visit.    No Known Allergies  Review of Systems  Constitutional: Positive for malaise/fatigue. Negative for fever.  HENT: Negative for congestion.   Eyes: Negative for blurred vision.  Respiratory: Negative for shortness of breath.   Cardiovascular: Negative for chest pain, palpitations and leg swelling.  Gastrointestinal: Positive for heartburn. Negative for abdominal pain, blood in stool and nausea.  Genitourinary: Negative for dysuria and frequency.  Musculoskeletal: Negative for falls.  Skin: Negative for rash.  Neurological: Negative for dizziness, loss of consciousness and headaches.  Endo/Heme/Allergies: Negative for environmental allergies.  Psychiatric/Behavioral: Negative for depression. The patient is not nervous/anxious.        Objective:    Physical Exam Vitals and nursing note reviewed.  Constitutional:      General: She is not in acute distress.    Appearance: She is well-developed.  HENT:     Head: Normocephalic and atraumatic.     Nose: Nose normal.  Eyes:     General:        Right eye: No discharge.        Left eye: No discharge.  Cardiovascular:     Rate and Rhythm: Normal rate and regular rhythm.     Heart sounds: No murmur.  Pulmonary:     Effort: Pulmonary effort is normal.     Breath sounds: Normal breath sounds.  Abdominal:     General: Bowel sounds are normal.     Palpations: Abdomen is soft.     Tenderness: There is  no abdominal tenderness.  Musculoskeletal:     Cervical back: Normal range of motion and neck supple.  Skin:    General: Skin is warm and dry.  Neurological:     Mental Status: She is alert and oriented to person, place, and time.     BP 108/64 (BP Location: Left Arm, Patient Position: Sitting, Cuff Size: Normal)   Pulse 81   Temp (!) 96.8 F (36 C) (Temporal)   Ht 5' 4"  (1.626 m)   Wt 210 lb 8 oz (95.5 kg)   SpO2 94%   BMI 36.13 kg/m  Wt Readings from Last 3 Encounters:  03/25/19 210 lb 8 oz (95.5 kg)  03/03/19 211 lb 9.6 oz (96 kg)  02/13/19 209 lb 8 oz (95 kg)    Diabetic Foot Exam - Simple   No data filed     Lab Results  Component Value Date   WBC 10.4 03/25/2019   HGB 12.4 03/25/2019   HCT 38.0 03/25/2019   PLT 205.0 03/25/2019   GLUCOSE 127 (H) 03/25/2019   CHOL 169 01/17/2019   TRIG 261.0 (H) 01/17/2019   HDL 45.90 01/17/2019   LDLDIRECT 68.0 01/17/2019   LDLCALC (H) 01/31/2007    117        Total Cholesterol/HDL:CHD Risk Coronary Heart Disease Risk Table                     Men   Women  1/2 Average Risk   3.4   3.3  Average Risk       5.0   4.4  2 X Average Risk   9.6   7.1  3 X Average Risk  23.4   11.0        Use the calculated Patient Ratio above and the CHD Risk Table to determine the patient's CHD Risk.        ATP III CLASSIFICATION (LDL):  <100     mg/dL   Optimal  100-129  mg/dL   Near or Above                    Optimal  130-159  mg/dL   Borderline  160-189  mg/dL   High  >190     mg/dL   Very High   ALT 43 (H) 03/25/2019   AST 50 (H) 03/25/2019   NA 137 03/25/2019   K 4.8 03/25/2019   CL 100 03/25/2019   CREATININE 1.29 (H) 03/25/2019   BUN 27 (H) 03/25/2019   CO2 28 03/25/2019   TSH 2.24 01/17/2019   INR 1.1 08/19/2018   HGBA1C 6.7 (H) 01/17/2019    Lab Results  Component Value Date   TSH 2.24 01/17/2019   Lab Results  Component Value Date   WBC 10.4 03/25/2019   HGB 12.4 03/25/2019   HCT 38.0 03/25/2019   MCV 98.2  03/25/2019   PLT 205.0 03/25/2019   Lab Results  Component Value Date   NA 137 03/25/2019   K 4.8 03/25/2019   CHLORIDE 104 11/01/2016   CO2 28 03/25/2019   GLUCOSE 127 (H) 03/25/2019   BUN 27 (H) 03/25/2019   CREATININE 1.29 (H) 03/25/2019   BILITOT 1.0 03/25/2019   ALKPHOS 64 03/25/2019   AST 50 (H) 03/25/2019   ALT 43 (H) 03/25/2019   PROT 7.6 03/25/2019   ALBUMIN 4.4 03/25/2019   CALCIUM 10.7 (H) 03/25/2019   ANIONGAP 12 08/22/2018   EGFR 50 (L) 11/01/2016   GFR 40.19 (L) 03/25/2019   Lab Results  Component Value Date   CHOL 169 01/17/2019   Lab Results  Component Value Date   HDL 45.90 01/17/2019   Lab Results  Component Value Date   LDLCALC (H) 01/31/2007    117        Total Cholesterol/HDL:CHD Risk Coronary Heart Disease Risk Table                     Men   Women  1/2 Average Risk   3.4   3.3  Average Risk       5.0   4.4  2 X Average Risk   9.6   7.1  3 X Average Risk  23.4   11.0        Use the calculated Patient Ratio above and the CHD Risk Table to determine the patient's CHD Risk.        ATP III CLASSIFICATION (LDL):  <100     mg/dL   Optimal  100-129  mg/dL   Near or Above                    Optimal  130-159  mg/dL   Borderline  160-189  mg/dL   High  >190     mg/dL   Very High   Lab Results  Component Value Date   TRIG 261.0 (H) 01/17/2019   Lab Results  Component Value Date   CHOLHDL 4 01/17/2019   Lab Results  Component Value Date   HGBA1C 6.7 (H) 01/17/2019       Assessment & Plan:   Problem List Items Addressed This Visit    Low serum  vitamin D    Supplement and monitor      DM (diabetes mellitus), type 2 ([facility])    hgba1c acceptable, minimize simple carbs. Increase exercise as tolerated. Continue current meds      Renal insufficiency    Hydrate and monitor      Diverticulitis    She reports symptoms similar to a previous episode of Diverticulitis. Start a probiotic.        Other Visit Diagnoses    Abdominal  pain, unspecified abdominal location    -  Primary   Relevant Orders   Comprehensive metabolic panel (Completed)   CBC w/Diff (Completed)      I have discontinued Ileana Ladd. Benak's hyoscyamine and metFORMIN. I am also having her start on ciprofloxacin and metronidazole. Additionally, I am having her maintain her diphenhydramine-acetaminophen, meclizine, aspirin EC, Fish Oil, nystatin cream, Calcium Carb-Cholecalciferol (CALCIUM 600 + D PO), OneTouch Verio, allopurinol, OneTouch Delica Lancets 35O, losartan, carvedilol, atorvastatin, Vitamin D (Ergocalciferol), and furosemide.  Meds ordered this encounter  Medications  . DISCONTD: glyBURIDE-metformin (GLUCOVANCE) 1.25-250 MG tablet    Sig: Take 1 tablet by mouth 2 (two) times daily with a meal.    Dispense:  60 tablet    Refill:  3  . ciprofloxacin (CIPRO) 500 MG tablet    Sig: Take 1 tablet (500 mg total) by mouth 2 (two) times daily.    Dispense:  14 tablet    Refill:  0    Only fill if patient requests  . metronidazole (FLAGYL) 375 MG capsule    Sig: Take 1 capsule (375 mg total) by mouth 3 (three) times daily.    Dispense:  21 capsule    Refill:  0    Only fill if patient requests     Penni Homans, MD

## 2019-04-15 ENCOUNTER — Ambulatory Visit: Payer: Medicare HMO | Admitting: Family Medicine

## 2019-04-28 ENCOUNTER — Ambulatory Visit (INDEPENDENT_AMBULATORY_CARE_PROVIDER_SITE_OTHER): Payer: Medicare HMO | Admitting: Family Medicine

## 2019-04-28 ENCOUNTER — Other Ambulatory Visit: Payer: Self-pay

## 2019-04-28 ENCOUNTER — Other Ambulatory Visit: Payer: Self-pay | Admitting: Interventional Radiology

## 2019-04-28 VITALS — BP 113/45 | HR 74 | Temp 98.4°F | Resp 12 | Ht 64.0 in | Wt 213.6 lb

## 2019-04-28 DIAGNOSIS — N2889 Other specified disorders of kidney and ureter: Secondary | ICD-10-CM

## 2019-04-28 DIAGNOSIS — N289 Disorder of kidney and ureter, unspecified: Secondary | ICD-10-CM | POA: Diagnosis not present

## 2019-04-28 DIAGNOSIS — E119 Type 2 diabetes mellitus without complications: Secondary | ICD-10-CM

## 2019-04-28 DIAGNOSIS — R7989 Other specified abnormal findings of blood chemistry: Secondary | ICD-10-CM

## 2019-04-28 DIAGNOSIS — M1A9XX0 Chronic gout, unspecified, without tophus (tophi): Secondary | ICD-10-CM

## 2019-04-28 DIAGNOSIS — K219 Gastro-esophageal reflux disease without esophagitis: Secondary | ICD-10-CM | POA: Diagnosis not present

## 2019-04-28 DIAGNOSIS — I5022 Chronic systolic (congestive) heart failure: Secondary | ICD-10-CM

## 2019-04-28 DIAGNOSIS — E785 Hyperlipidemia, unspecified: Secondary | ICD-10-CM | POA: Diagnosis not present

## 2019-04-28 LAB — COMPREHENSIVE METABOLIC PANEL
ALT: 39 U/L — ABNORMAL HIGH (ref 0–35)
AST: 49 U/L — ABNORMAL HIGH (ref 0–37)
Albumin: 4.2 g/dL (ref 3.5–5.2)
Alkaline Phosphatase: 61 U/L (ref 39–117)
BUN: 24 mg/dL — ABNORMAL HIGH (ref 6–23)
CO2: 27 mEq/L (ref 19–32)
Calcium: 10.3 mg/dL (ref 8.4–10.5)
Chloride: 102 mEq/L (ref 96–112)
Creatinine, Ser: 1.26 mg/dL — ABNORMAL HIGH (ref 0.40–1.20)
GFR: 41.29 mL/min — ABNORMAL LOW (ref >60.00)
Glucose, Bld: 142 mg/dL — ABNORMAL HIGH (ref 70–99)
Potassium: 5.2 mEq/L — ABNORMAL HIGH (ref 3.5–5.1)
Sodium: 137 mEq/L (ref 135–145)
Total Bilirubin: 1 mg/dL (ref 0.2–1.2)
Total Protein: 7.2 g/dL (ref 6.0–8.3)

## 2019-04-28 LAB — URIC ACID: Uric Acid, Serum: 4.9 mg/dL (ref 2.4–7.0)

## 2019-04-28 LAB — LIPID PANEL
Cholesterol: 187 mg/dL (ref 0–200)
HDL: 46.8 mg/dL (ref >39.00)
NonHDL: 140.06
Total CHOL/HDL Ratio: 4
Triglycerides: 344 mg/dL — ABNORMAL HIGH (ref 0.0–149.0)
VLDL: 68.8 mg/dL — ABNORMAL HIGH (ref 0.0–40.0)

## 2019-04-28 LAB — HEMOGLOBIN A1C: Hgb A1c MFr Bld: 6.9 % — ABNORMAL HIGH (ref 4.6–6.5)

## 2019-04-28 LAB — CBC
HCT: 36.2 % (ref 36.0–46.0)
Hemoglobin: 12.1 g/dL (ref 12.0–15.0)
MCHC: 33.4 g/dL (ref 30.0–36.0)
MCV: 98.7 fl (ref 78.0–100.0)
Platelets: 185 10*3/uL (ref 150.0–400.0)
RBC: 3.67 Mil/uL — ABNORMAL LOW (ref 3.87–5.11)
RDW: 15.6 % — ABNORMAL HIGH (ref 11.5–15.5)
WBC: 8.3 10*3/uL (ref 4.0–10.5)

## 2019-04-28 LAB — TSH: TSH: 2.56 u[IU]/mL (ref 0.35–4.50)

## 2019-04-28 LAB — VITAMIN D 25 HYDROXY (VIT D DEFICIENCY, FRACTURES): VITD: 54.22 ng/mL (ref 30.00–100.00)

## 2019-04-28 LAB — LDL CHOLESTEROL, DIRECT: Direct LDL: 74 mg/dL

## 2019-04-28 MED ORDER — GLIMEPIRIDE 2 MG PO TABS: 2.0000 mg | ORAL_TABLET | Freq: Every day | ORAL | 3 refills | Status: DC

## 2019-04-28 NOTE — Patient Instructions (Signed)
Carbohydrate Counting for Diabetes Mellitus, Adult  Carbohydrate counting is a method of keeping track of how many carbohydrates you eat. Eating carbohydrates naturally increases the amount of sugar (glucose) in the blood. Counting how many carbohydrates you eat helps keep your blood glucose within normal limits, which helps you manage your diabetes (diabetes mellitus). It is important to know how many carbohydrates you can safely have in each meal. This is different for every person. A diet and nutrition specialist (registered dietitian) can help you make a meal plan and calculate how many carbohydrates you should have at each meal and snack. Carbohydrates are found in the following foods:  Grains, such as breads and cereals.  Dried beans and soy products.  Starchy vegetables, such as potatoes, peas, and corn.  Fruit and fruit juices.  Milk and yogurt.  Sweets and snack foods, such as cake, cookies, candy, chips, and soft drinks. How do I count carbohydrates? There are two ways to count carbohydrates in food. You can use either of the methods or a combination of both. Reading "Nutrition Facts" on packaged food The "Nutrition Facts" list is included on the labels of almost all packaged foods and beverages in the [place] It includes:  The serving size.  Information about nutrients in each serving, including the grams (g) of carbohydrate per serving. To use the "Nutrition Facts":  Decide how many servings you will have.  Multiply the number of servings by the number of carbohydrates per serving.  The resulting number is the total amount of carbohydrates that you will be having. Learning standard serving sizes of other foods When you eat carbohydrate foods that are not packaged or do not include "Nutrition Facts" on the label, you need to measure the servings in order to count the amount of carbohydrates:  Measure the foods that you will eat with a food scale or measuring cup, if  needed.  Decide how many standard-size servings you will eat.  Multiply the number of servings by 15. Most carbohydrate-rich foods have about 15 g of carbohydrates per serving. ? For example, if you eat 8 oz (170 g) of strawberries, you will have eaten 2 servings and 30 g of carbohydrates (2 servings x 15 g = 30 g).  For foods that have more than one food mixed, such as soups and casseroles, you must count the carbohydrates in each food that is included. The following list contains standard serving sizes of common carbohydrate-rich foods. Each of these servings has about 15 g of carbohydrates:   hamburger bun or  English muffin.   oz (15 mL) syrup.   oz (14 g) jelly.  1 slice of bread.  1 six-inch tortilla.  3 oz (85 g) cooked rice or pasta.  4 oz (113 g) cooked dried beans.  4 oz (113 g) starchy vegetable, such as peas, corn, or potatoes.  4 oz (113 g) hot cereal.  4 oz (113 g) mashed potatoes or  of a large baked potato.  4 oz (113 g) canned or frozen fruit.  4 oz (120 mL) fruit juice.  4-6 crackers.  6 chicken nuggets.  6 oz (170 g) unsweetened dry cereal.  6 oz (170 g) plain fat-free yogurt or yogurt sweetened with artificial sweeteners.  8 oz (240 mL) milk.  8 oz (170 g) fresh fruit or one small piece of fruit.  24 oz (680 g) popped popcorn. Example of carbohydrate counting Sample meal  3 oz (85 g) chicken breast.  6 oz (170 g)   brown rice.  4 oz (113 g) corn.  8 oz (240 mL) milk.  8 oz (170 g) strawberries with sugar-free whipped topping. Carbohydrate calculation 1. Identify the foods that contain carbohydrates: ? Rice. ? Corn. ? Milk. ? Strawberries. 2. Calculate how many servings you have of each food: ? 2 servings rice. ? 1 serving corn. ? 1 serving milk. ? 1 serving strawberries. 3. Multiply each number of servings by 15 g: ? 2 servings rice x 15 g = 30 g. ? 1 serving corn x 15 g = 15 g. ? 1 serving milk x 15 g = 15 g. ? 1  serving strawberries x 15 g = 15 g. 4. Add together all of the amounts to find the total grams of carbohydrates eaten: ? 30 g + 15 g + 15 g + 15 g = 75 g of carbohydrates total. Summary  Carbohydrate counting is a method of keeping track of how many carbohydrates you eat.  Eating carbohydrates naturally increases the amount of sugar (glucose) in the blood.  Counting how many carbohydrates you eat helps keep your blood glucose within normal limits, which helps you manage your diabetes.  A diet and nutrition specialist (registered dietitian) can help you make a meal plan and calculate how many carbohydrates you should have at each meal and snack. This information is not intended to replace advice given to you by your health care provider. Make sure you discuss any questions you have with your health care provider. Document Revised: 08/10/2016 Document Reviewed: 06/30/2015 Elsevier Patient Education  2020 [employer]  

## 2019-04-30 NOTE — Assessment & Plan Note (Addendum)
Add glimeperide daily and monitor, minimize simple carbs. Increase exercise as tolerated. Continue current meds

## 2019-04-30 NOTE — Assessment & Plan Note (Signed)
Avoid offending foods, start probiotics. Do not eat large meals in late evening and consider raising head of bed.  

## 2019-04-30 NOTE — Progress Notes (Signed)
Subjective:    Patient ID: Amanda Crawford, female    DOB: 1945/08/30, 74 y.o.   MRN: [id number]  Chief Complaint  Patient presents with  . 5 week follow up    HPI Patient is in today for follow up on chronic medical concerns. She is very stressed as her husband is having open heart surgery next week. She has not had any recent febrile illness or hospitalization. She is trying to maintain a heart healthy diet. Denies CP/palp/SOB/HA/congestion/fevers/GI or GU c/o. Taking meds as prescribed  Past Medical History:  Diagnosis Date  . Allergic state 03/12/2015  . Arthritis   . Colon polyp 11/19/2014  . Dermatitis 03/12/2015  . Diarrhea 06/29/2016  . DM (diabetes mellitus), type 2 ([place]) 06/29/2016  . GERD (gastroesophageal reflux disease)    occ  . Gout 03/12/2015  . H/O measles   . H/O mumps   . Headache(784.0)    migraines  . History of chicken pox 11/23/2014  . Hyperglycemia 06/29/2016  . Lung cancer ([place])   . Migraine 11/23/2014  . Obesity 11/23/2014  . Pneumonia    child  . Preventative health care 09/12/2015  . Renal cell cancer ([place])    renal cell ca dx 9/01 and 8/08;  . Shortness of breath    occ  . Skin lesion of breast 03/12/2015    Past Surgical History:  Procedure Laterality Date  . ABDOMINAL HYSTERECTOMY    . APPENDECTOMY    . CARDIAC CATHETERIZATION     yrs ago neg  . CHOLECYSTECTOMY    . IR RADIOLOGIST EVAL & MGMT  07/17/2018  . IR RADIOLOGIST EVAL & MGMT  09/12/2018  . LUNG CANCER SURGERY Left 08  . RADIOLOGY WITH ANESTHESIA N/A 08/21/2018   Procedure: CT WITH ANESTHESIA RENAL CRYOABLATION;  Surgeon: Arne Cleveland, MD;  Location: [facility];  Service: Radiology;  Laterality: N/A;  . RENAL MASS EXCISION Left 01  . RIGHT/LEFT HEART CATH AND CORONARY ANGIOGRAPHY N/A 07/27/2016   Procedure: Right/Left Heart Cath and Coronary Angiography;  Surgeon: Leonie Man, MD;  Location: [facility];  Service: Cardiovascular;  Laterality: N/A;  . THORACOTOMY Left  05/09/2012   Procedure: THORACOTOMY MAJOR;  Surgeon: Gaye Pollack, MD;  Location: [facility];  Service: Thoracic;  Laterality: Left;  Marland Kitchen VIDEO BRONCHOSCOPY N/A 05/09/2012   Procedure: VIDEO BRONCHOSCOPY;  Surgeon: Gaye Pollack, MD;  Location: [facility];  Service: Thoracic;  Laterality: N/A;  . WEDGE RESECTION Left 05/09/2012   Procedure: LEFT UPPER LOBE WEDGE RESECTION;  Surgeon: Gaye Pollack, MD;  Location: [facility] OR;  Service: Thoracic;  Laterality: Left;    Family History  Problem Relation Age of Onset  . Heart failure Father   . Diabetes Father   . Hyperlipidemia Father   . Heart disease Father   . Hypertension Father   . Asthma Brother   . Asthma Daughter   . Cancer Paternal Grandmother   . Hearing loss Paternal Grandfather   . Stroke Paternal Grandfather   . Alcohol abuse Maternal Aunt     Social History   Socioeconomic History  . Marital status: Married    Spouse name: Not on file  . Number of children: Not on file  . Years of education: Not on file  . Highest education level: Not on file  Occupational History  . Not on file  Tobacco Use  . Smoking status: Former Smoker    Packs/day: 0.50    Years: 35.00  Pack years: 17.50    Types: Cigarettes    Quit date: 05/08/2006    Years since quitting: 12.9  . Smokeless tobacco: Never Used  Substance and Sexual Activity  . Alcohol use: No  . Drug use: No  . Sexual activity: Not on file    Comment: lives with husband, no dietary restrictions.   Other Topics Concern  . Not on file  Social History Narrative  . Not on file   Social Determinants of Health   Financial Resource Strain:   . Difficulty of Paying Living Expenses:   Food Insecurity:   . Worried About Charity fundraiser in the Last Year:   . Arboriculturist in the Last Year:   Transportation Needs:   . Film/video editor (Medical):   Marland Kitchen Lack of Transportation (Non-Medical):   Physical Activity:   . Days of Exercise per Week:   . Minutes of Exercise per  Session:   Stress:   . Feeling of Stress :   Social Connections:   . Frequency of Communication with Friends and Family:   . Frequency of Social Gatherings with Friends and Family:   . Attends Religious Services:   . Active Member of Clubs or Organizations:   . Attends Archivist Meetings:   Marland Kitchen Marital Status:   Intimate Partner Violence:   . Fear of Current or Ex-Partner:   . Emotionally Abused:   Marland Kitchen Physically Abused:   . Sexually Abused:     Outpatient Medications Prior to Visit  Medication Sig Dispense Refill  . allopurinol (ZYLOPRIM) 300 MG tablet TAKE 1 TABLET BY MOUTH EVERY DAY 90 tablet 1  . aspirin EC 81 MG tablet Take 81 mg by mouth daily.    Marland Kitchen atorvastatin (LIPITOR) 20 MG tablet Take 0.5 tablets (10 mg total) by mouth daily. 90 tablet 3  . Calcium Carb-Cholecalciferol (CALCIUM 600 + D PO) Take 1 tablet by mouth daily.    . carvedilol (COREG) 6.25 MG tablet TAKE 1 TABLET (6.25 MG TOTAL) BY MOUTH 2 (TWO) TIMES DAILY. PT NEEDS AN OV FOR FUTURE REFILLS 30 tablet 5  . diphenhydramine-acetaminophen (TYLENOL PM) 25-500 MG TABS Take 1 tablet by mouth at bedtime as needed (sleep).     . furosemide (LASIX) 40 MG tablet TAKE 1 TABLET BY MOUTH EVERY DAY 90 tablet 1  . losartan (COZAAR) 25 MG tablet Take 1 tablet (25 mg total) by mouth daily. 90 tablet 1  . meclizine (ANTIVERT) 12.5 MG tablet Take 1 tablet (12.5 mg total) by mouth 3 (three) times daily as needed for dizziness. As needed for   Dizziness or nausea 30 tablet 1  . metFORMIN (GLUCOPHAGE) 500 MG tablet 1/2 TABLET TWICE A DAY 90 tablet 1  . nystatin cream (MYCOSTATIN) Apply 1 application topically 2 (two) times daily. 30 g 1  . Omega-3 Fatty Acids (FISH OIL) 1000 MG CAPS Take 1 capsule (1,000 mg total) by mouth 2 (two) times daily.  0  . OneTouch Delica Lancets 48A MISC USE AS DIRECTED TO TEST DAILY 100 each 2  . ONETOUCH VERIO test strip CEHCK BLOOD SUGAR DAILY AS NEEDED 100 strip 2  . Vitamin D, Ergocalciferol,  (DRISDOL) 1.25 MG (50000 UNIT) CAPS capsule TAKE 1 CAPSULE (50,000 UNITS TOTAL) BY MOUTH EVERY 7 (SEVEN) DAYS. 12 capsule 1  . ciprofloxacin (CIPRO) 500 MG tablet Take 1 tablet (500 mg total) by mouth 2 (two) times daily. 14 tablet 0  . metronidazole (FLAGYL) 375 MG capsule  Take 1 capsule (375 mg total) by mouth 3 (three) times daily. 21 capsule 0   No facility-administered medications prior to visit.    No Known Allergies  Review of Systems  Constitutional: Negative for fever and malaise/fatigue.  HENT: Negative for congestion.   Eyes: Negative for blurred vision.  Respiratory: Negative for shortness of breath.   Cardiovascular: Negative for chest pain, palpitations and leg swelling.  Gastrointestinal: Negative for abdominal pain, blood in stool and nausea.  Genitourinary: Positive for frequency and urgency. Negative for dysuria.  Musculoskeletal: Negative for falls.  Skin: Negative for rash.  Neurological: Negative for dizziness, loss of consciousness and headaches.  Endo/Heme/Allergies: Negative for environmental allergies.  Psychiatric/Behavioral: Negative for depression. The patient is nervous/anxious.        Objective:    Physical Exam Vitals and nursing note reviewed.  Constitutional:      General: She is not in acute distress.    Appearance: She is well-developed.  HENT:     Head: Normocephalic and atraumatic.     Nose: Nose normal.  Eyes:     General:        Right eye: No discharge.        Left eye: No discharge.  Cardiovascular:     Rate and Rhythm: Normal rate and regular rhythm.     Heart sounds: No murmur.  Pulmonary:     Effort: Pulmonary effort is normal.     Breath sounds: Normal breath sounds.  Abdominal:     General: Bowel sounds are normal.     Palpations: Abdomen is soft.     Tenderness: There is no abdominal tenderness.  Musculoskeletal:     Cervical back: Normal range of motion and neck supple.  Skin:    General: Skin is warm and dry.    Neurological:     Mental Status: She is alert and oriented to person, place, and time.     BP (!) 113/45 (BP Location: Right Arm, Cuff Size: Large)   Pulse 74   Temp 98.4 F (36.9 C) (Oral)   Resp 12   Ht 5' 4"  (1.626 m)   Wt 213 lb 9.6 oz (96.9 kg)   SpO2 97%   BMI 36.66 kg/m  Wt Readings from Last 3 Encounters:  04/28/19 213 lb 9.6 oz (96.9 kg)  03/25/19 210 lb 8 oz (95.5 kg)  03/03/19 211 lb 9.6 oz (96 kg)    Diabetic Foot Exam - Simple   No data filed     Lab Results  Component Value Date   WBC 8.3 04/28/2019   HGB 12.1 04/28/2019   HCT 36.2 04/28/2019   PLT 185.0 04/28/2019   GLUCOSE 142 (H) 04/28/2019   CHOL 187 04/28/2019   TRIG 344.0 (H) 04/28/2019   HDL 46.80 04/28/2019   LDLDIRECT 74.0 04/28/2019   LDLCALC (H) 01/31/2007    117        Total Cholesterol/HDL:CHD Risk Coronary Heart Disease Risk Table                     Men   Women  1/2 Average Risk   3.4   3.3  Average Risk       5.0   4.4  2 X Average Risk   9.6   7.1  3 X Average Risk  23.4   11.0        Use the calculated Patient Ratio above and the CHD Risk Table to determine the patient's CHD Risk.  ATP III CLASSIFICATION (LDL):  <100     mg/dL   Optimal  100-129  mg/dL   Near or Above                    Optimal  130-159  mg/dL   Borderline  160-189  mg/dL   High  >190     mg/dL   Very High   ALT 39 (H) 04/28/2019   AST 49 (H) 04/28/2019   NA 137 04/28/2019   K 5.2 (H) 04/28/2019   CL 102 04/28/2019   CREATININE 1.26 (H) 04/28/2019   BUN 24 (H) 04/28/2019   CO2 27 04/28/2019   TSH 2.56 04/28/2019   INR 1.1 08/19/2018   HGBA1C 6.9 (H) 04/28/2019    Lab Results  Component Value Date   TSH 2.56 04/28/2019   Lab Results  Component Value Date   WBC 8.3 04/28/2019   HGB 12.1 04/28/2019   HCT 36.2 04/28/2019   MCV 98.7 04/28/2019   PLT 185.0 04/28/2019   Lab Results  Component Value Date   NA 137 04/28/2019   K 5.2 (H) 04/28/2019   CHLORIDE 104 11/01/2016   CO2 27  04/28/2019   GLUCOSE 142 (H) 04/28/2019   BUN 24 (H) 04/28/2019   CREATININE 1.26 (H) 04/28/2019   BILITOT 1.0 04/28/2019   ALKPHOS 61 04/28/2019   AST 49 (H) 04/28/2019   ALT 39 (H) 04/28/2019   PROT 7.2 04/28/2019   ALBUMIN 4.2 04/28/2019   CALCIUM 10.3 04/28/2019   ANIONGAP 12 08/22/2018   EGFR 50 (L) 11/01/2016   GFR 41.29 (L) 04/28/2019   Lab Results  Component Value Date   CHOL 187 04/28/2019   Lab Results  Component Value Date   HDL 46.80 04/28/2019   Lab Results  Component Value Date   LDLCALC (H) 01/31/2007    117        Total Cholesterol/HDL:CHD Risk Coronary Heart Disease Risk Table                     Men   Women  1/2 Average Risk   3.4   3.3  Average Risk       5.0   4.4  2 X Average Risk   9.6   7.1  3 X Average Risk  23.4   11.0        Use the calculated Patient Ratio above and the CHD Risk Table to determine the patient's CHD Risk.        ATP III CLASSIFICATION (LDL):  <100     mg/dL   Optimal  100-129  mg/dL   Near or Above                    Optimal  130-159  mg/dL   Borderline  160-189  mg/dL   High  >190     mg/dL   Very High   Lab Results  Component Value Date   TRIG 344.0 (H) 04/28/2019   Lab Results  Component Value Date   CHOLHDL 4 04/28/2019   Lab Results  Component Value Date   HGBA1C 6.9 (H) 04/28/2019       Assessment & Plan:   Problem List Items Addressed This Visit    GERD (gastroesophageal reflux disease)    Avoid offending foods, start probiotics. Do not eat large meals in late evening and consider raising head of bed.       Hyperlipidemia - Primary  Relevant Orders   Lipid panel (Completed)   TSH (Completed)   Low serum vitamin D    Supplement and monitor      Relevant Orders   Comprehensive metabolic panel (Completed)   VITAMIN D 25 Hydroxy (Vit-D Deficiency, Fractures) (Completed)   Gout   Relevant Orders   Uric acid (Completed)   DM (diabetes mellitus), type 2 (HCC)    Add glimeperide daily and  monitor, minimize simple carbs. Increase exercise as tolerated. Continue current meds      Relevant Medications   glimepiride (AMARYL) 2 MG tablet   Other Relevant Orders   Hemoglobin A1c (Completed)   TSH (Completed)   VITAMIN D 25 Hydroxy (Vit-D Deficiency, Fractures) (Completed)   Chronic systolic heart failure (HCC)    No recent exacerbation      Relevant Orders   CBC (Completed)   Renal insufficiency    Supplement and monitor         I have discontinued Ileana Ladd. Dunford's ciprofloxacin and metronidazole. I am also having her start on glimepiride. Additionally, I am having her maintain her diphenhydramine-acetaminophen, meclizine, aspirin EC, Fish Oil, nystatin cream, Calcium Carb-Cholecalciferol (CALCIUM 600 + D PO), OneTouch Verio, allopurinol, OneTouch Delica Lancets 74Q, losartan, carvedilol, atorvastatin, Vitamin D (Ergocalciferol), furosemide, and metFORMIN.  Meds ordered this encounter  Medications  . glimepiride (AMARYL) 2 MG tablet    Sig: Take 1 tablet (2 mg total) by mouth daily before breakfast.    Dispense:  30 tablet    Refill:  3     Penni Homans, MD

## 2019-04-30 NOTE — Assessment & Plan Note (Signed)
Supplement and monitor 

## 2019-04-30 NOTE — Assessment & Plan Note (Signed)
No recent exacerbation 

## 2019-05-03 DIAGNOSIS — R69 Illness, unspecified: Secondary | ICD-10-CM | POA: Diagnosis not present

## 2019-05-05 ENCOUNTER — Ambulatory Visit: Payer: Medicare HMO | Admitting: Family Medicine

## 2019-05-21 ENCOUNTER — Inpatient Hospital Stay: Payer: Medicare HMO

## 2019-05-21 ENCOUNTER — Ambulatory Visit (HOSPITAL_COMMUNITY): Payer: Medicare HMO

## 2019-05-28 ENCOUNTER — Ambulatory Visit: Payer: Medicare HMO | Admitting: Oncology

## 2019-06-03 ENCOUNTER — Telehealth: Payer: Medicare HMO

## 2019-06-05 ENCOUNTER — Inpatient Hospital Stay: Payer: Medicare HMO | Attending: Oncology

## 2019-06-05 ENCOUNTER — Encounter (HOSPITAL_COMMUNITY): Payer: Self-pay

## 2019-06-05 ENCOUNTER — Other Ambulatory Visit: Payer: Self-pay

## 2019-06-05 ENCOUNTER — Other Ambulatory Visit: Payer: Self-pay | Admitting: Family Medicine

## 2019-06-05 ENCOUNTER — Ambulatory Visit (HOSPITAL_COMMUNITY)
Admission: RE | Admit: 2019-06-05 | Discharge: 2019-06-05 | Disposition: A | Discharge status: Home / Self Care | Payer: Medicare HMO | Source: Ambulatory Visit | Attending: Oncology | Admitting: Oncology

## 2019-06-05 DIAGNOSIS — Z905 Acquired absence of kidney: Secondary | ICD-10-CM | POA: Insufficient documentation

## 2019-06-05 DIAGNOSIS — Z79899 Other long term (current) drug therapy: Secondary | ICD-10-CM | POA: Diagnosis not present

## 2019-06-05 DIAGNOSIS — C649 Malignant neoplasm of unspecified kidney, except renal pelvis: Secondary | ICD-10-CM | POA: Diagnosis not present

## 2019-06-05 DIAGNOSIS — D4989 Neoplasm of unspecified behavior of other specified sites: Secondary | ICD-10-CM | POA: Insufficient documentation

## 2019-06-05 DIAGNOSIS — N8189 Other female genital prolapse: Secondary | ICD-10-CM | POA: Diagnosis not present

## 2019-06-05 DIAGNOSIS — R599 Enlarged lymph nodes, unspecified: Secondary | ICD-10-CM | POA: Insufficient documentation

## 2019-06-05 DIAGNOSIS — K7460 Unspecified cirrhosis of liver: Secondary | ICD-10-CM | POA: Insufficient documentation

## 2019-06-05 DIAGNOSIS — I700 Atherosclerosis of aorta: Secondary | ICD-10-CM | POA: Insufficient documentation

## 2019-06-05 DIAGNOSIS — Z85528 Personal history of other malignant neoplasm of kidney: Secondary | ICD-10-CM | POA: Diagnosis not present

## 2019-06-05 DIAGNOSIS — J439 Emphysema, unspecified: Secondary | ICD-10-CM | POA: Insufficient documentation

## 2019-06-05 DIAGNOSIS — I2510 Atherosclerotic heart disease of native coronary artery without angina pectoris: Secondary | ICD-10-CM | POA: Insufficient documentation

## 2019-06-05 DIAGNOSIS — C7802 Secondary malignant neoplasm of left lung: Secondary | ICD-10-CM | POA: Diagnosis not present

## 2019-06-05 DIAGNOSIS — C642 Malignant neoplasm of left kidney, except renal pelvis: Secondary | ICD-10-CM | POA: Insufficient documentation

## 2019-06-05 DIAGNOSIS — C7800 Secondary malignant neoplasm of unspecified lung: Secondary | ICD-10-CM | POA: Diagnosis not present

## 2019-06-05 LAB — CMP (CANCER CENTER ONLY)
ALT: 34 U/L (ref 0–44)
AST: 43 U/L — ABNORMAL HIGH (ref 15–41)
Albumin: 3.9 g/dL (ref 3.5–5.0)
Alkaline Phosphatase: 65 U/L (ref 38–126)
Anion gap: 8 (ref 5–15)
BUN: 28 mg/dL — ABNORMAL HIGH (ref 8–23)
CO2: 23 mmol/L (ref 22–32)
Calcium: 9.9 mg/dL (ref 8.9–10.3)
Chloride: 109 mmol/L (ref 98–111)
Creatinine: 1.35 mg/dL — ABNORMAL HIGH (ref 0.44–1.00)
GFR, Est AFR Am: 44 mL/min — ABNORMAL LOW (ref >60)
GFR, Estimated: 38 mL/min — ABNORMAL LOW (ref >60)
Glucose, Bld: 127 mg/dL — ABNORMAL HIGH (ref 70–99)
Potassium: 4.8 mmol/L (ref 3.5–5.1)
Sodium: 140 mmol/L (ref 135–145)
Total Bilirubin: 0.6 mg/dL (ref 0.3–1.2)
Total Protein: 7.6 g/dL (ref 6.5–8.1)

## 2019-06-05 LAB — CBC WITH DIFFERENTIAL (CANCER CENTER ONLY)
Abs Immature Granulocytes: 0.03 10*3/uL (ref 0.00–0.07)
Basophils Absolute: 0 10*3/uL (ref 0.0–0.1)
Basophils Relative: 1 %
Eosinophils Absolute: 0.2 10*3/uL (ref 0.0–0.5)
Eosinophils Relative: 3 %
HCT: 38.4 % (ref 36.0–46.0)
Hemoglobin: 12.2 g/dL (ref 12.0–15.0)
Immature Granulocytes: 0 %
Lymphocytes Relative: 25 %
Lymphs Abs: 1.8 10*3/uL (ref 0.7–4.0)
MCH: 32.4 pg (ref 26.0–34.0)
MCHC: 31.8 g/dL (ref 30.0–36.0)
MCV: 101.9 fL — ABNORMAL HIGH (ref 80.0–100.0)
Monocytes Absolute: 0.5 10*3/uL (ref 0.1–1.0)
Monocytes Relative: 7 %
Neutro Abs: 4.6 10*3/uL (ref 1.7–7.7)
Neutrophils Relative %: 64 %
Platelet Count: 175 10*3/uL (ref 150–400)
RBC: 3.77 MIL/uL — ABNORMAL LOW (ref 3.87–5.11)
RDW: 14.7 % (ref 11.5–15.5)
WBC Count: 7.2 10*3/uL (ref 4.0–10.5)
nRBC: 0 % (ref 0.0–0.2)

## 2019-06-05 MED ORDER — SODIUM CHLORIDE (PF) 0.9 % IJ SOLN
INTRAMUSCULAR | Status: AC
  Filled 2019-06-05: qty 50

## 2019-06-05 MED ORDER — IOHEXOL 300 MG/ML  SOLN
75.0000 mL | Freq: Once | INTRAMUSCULAR | Status: AC | PRN
  Administered 2019-06-05: 75 mL via INTRAVENOUS

## 2019-06-05 MED ORDER — IOHEXOL 300 MG/ML  SOLN: 100.0000 mL | Freq: Once | INTRAMUSCULAR | Status: DC | PRN

## 2019-06-10 ENCOUNTER — Inpatient Hospital Stay: Payer: Medicare HMO | Admitting: Oncology

## 2019-06-10 ENCOUNTER — Other Ambulatory Visit: Payer: Self-pay

## 2019-06-10 VITALS — BP 112/55 | HR 87 | Temp 99.1°F | Resp 18 | Ht 64.0 in | Wt 213.9 lb

## 2019-06-10 DIAGNOSIS — I700 Atherosclerosis of aorta: Secondary | ICD-10-CM | POA: Diagnosis not present

## 2019-06-10 DIAGNOSIS — J439 Emphysema, unspecified: Secondary | ICD-10-CM | POA: Diagnosis not present

## 2019-06-10 DIAGNOSIS — Z79899 Other long term (current) drug therapy: Secondary | ICD-10-CM | POA: Diagnosis not present

## 2019-06-10 DIAGNOSIS — C7802 Secondary malignant neoplasm of left lung: Secondary | ICD-10-CM | POA: Diagnosis not present

## 2019-06-10 DIAGNOSIS — I2510 Atherosclerotic heart disease of native coronary artery without angina pectoris: Secondary | ICD-10-CM | POA: Diagnosis not present

## 2019-06-10 DIAGNOSIS — R599 Enlarged lymph nodes, unspecified: Secondary | ICD-10-CM | POA: Diagnosis not present

## 2019-06-10 DIAGNOSIS — D4989 Neoplasm of unspecified behavior of other specified sites: Secondary | ICD-10-CM | POA: Diagnosis not present

## 2019-06-10 DIAGNOSIS — Z905 Acquired absence of kidney: Secondary | ICD-10-CM | POA: Diagnosis not present

## 2019-06-10 DIAGNOSIS — Z85528 Personal history of other malignant neoplasm of kidney: Secondary | ICD-10-CM | POA: Diagnosis not present

## 2019-06-10 DIAGNOSIS — C642 Malignant neoplasm of left kidney, except renal pelvis: Secondary | ICD-10-CM | POA: Diagnosis not present

## 2019-06-10 DIAGNOSIS — K7460 Unspecified cirrhosis of liver: Secondary | ICD-10-CM | POA: Diagnosis not present

## 2019-06-10 NOTE — Progress Notes (Signed)
Hematology and Oncology Follow Up   HARTLYN REIGEL [id number] [birth year] 74 y.o. 06/10/2019 3:49 PM [name], MDBlyth, [name], MD       Principle Diagnosis: 74 year old woman wit:  1.  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    He developed stage IV disease that has been resected a couple occasions with the most recent of which in 2014..    2.  Right renal neoplasm.  She is status post cryoablation completed on August 21, 2018.  Prior Therapy:  She is status post left nephrectomy in 2001.  She developed a left lingular mass, status post VATS procedure.  Pathology revealed a recurrent renal cell carcinoma resected in October 2008.   She developed relapse disease in 2014 and subsequently underwent a VATS and left upper lobe wedge resection of a tumor done on 05/09/2012. Pathology confirmed another relapse. She is status post cryoablation of a right renal neoplasm on August 21, 2018.  Current therapy: Observation and surveillance.   Interim History: Mrs. Andujo returns today for a follow-up visit.  Since the last visit, she reports no major changes in her health.  She has been taking care of her husband who had surgical procedure and helped him in his recovery.  She denies any chest pain shortness of breath.  She denies any abdominal pain or difficulty breathing.  She denies any flank pain or hematuria.  Her activity level remained at baseline.    Medications: Reviewed without changes. Current Outpatient Medications  Medication Sig Dispense Refill  . allopurinol (ZYLOPRIM) 300 MG tablet TAKE 1 TABLET BY MOUTH EVERY DAY 90 tablet 1  . aspirin EC 81 MG tablet Take 81 mg by mouth daily.    Marland Kitchen atorvastatin (LIPITOR) 20 MG tablet Take 0.5 tablets (10 mg total) by mouth daily. 90 tablet 3  . Calcium Carb-Cholecalciferol (CALCIUM 600 + D PO) Take 1 tablet by mouth daily.    . carvedilol (COREG) 6.25 MG tablet TAKE 1 TABLET (6.25 MG TOTAL) BY MOUTH 2 (TWO)  TIMES DAILY. PT NEEDS AN OV FOR FUTURE REFILLS 30 tablet 5  . diphenhydramine-acetaminophen (TYLENOL PM) 25-500 MG TABS Take 1 tablet by mouth at bedtime as needed (sleep).     . furosemide (LASIX) 40 MG tablet TAKE 1 TABLET BY MOUTH EVERY DAY 90 tablet 1  . glimepiride (AMARYL) 2 MG tablet Take 1 tablet (2 mg total) by mouth daily before breakfast. 30 tablet 3  . losartan (COZAAR) 25 MG tablet Take 1 tablet (25 mg total) by mouth daily. 90 tablet 1  . meclizine (ANTIVERT) 12.5 MG tablet Take 1 tablet (12.5 mg total) by mouth 3 (three) times daily as needed for dizziness. As needed for   Dizziness or nausea 30 tablet 1  . metFORMIN (GLUCOPHAGE) 500 MG tablet 1/2 TABLET TWICE A DAY 90 tablet 1  . nystatin cream (MYCOSTATIN) Apply 1 application topically 2 (two) times daily. 30 g 1  . Omega-3 Fatty Acids (FISH OIL) 1000 MG CAPS Take 1 capsule (1,000 mg total) by mouth 2 (two) times daily.  0  . OneTouch Delica Lancets 01U MISC USE AS DIRECTED TO TEST DAILY 100 each 2  . ONETOUCH VERIO test strip CEHCK BLOOD SUGAR DAILY AS NEEDED 100 strip 2  . Vitamin D, Ergocalciferol, (DRISDOL) 1.25 MG (50000 UNIT) CAPS capsule TAKE 1 CAPSULE (50,000 UNITS TOTAL) BY MOUTH EVERY 7 (SEVEN) DAYS. 12 capsule 1   No current facility-administered medications for this visit.  Allergies: No Known Allergies  Physical exam: Blood pressure (!) 112/55, pulse 87, temperature 99.1 F (37.3 C), temperature source Temporal, resp. rate 18, height 5\' 4"  (1.626 m), weight 213 lb 14.4 oz (97 kg), SpO2 98 %.   ECOG 1  General appearance: Comfortable appearing without any discomfort Head: Normocephalic without any trauma Oropharynx: Mucous membranes are moist and pink without any thrush or ulcers. Eyes: Pupils are equal and round reactive to light. Lymph nodes: No cervical, supraclavicular, inguinal or axillary lymphadenopathy.   Heart:regular rate and rhythm.  S1 and S2 without leg edema. Lung: Clear without any  rhonchi or wheezes.  No dullness to percussion. Abdomin: Soft, nontender, nondistended with good bowel sounds.  No hepatosplenomegaly. Musculoskeletal: No joint deformity or effusion.  Full range of motion noted. Neurological: No deficits noted on motor, sensory and deep tendon reflex exam. Skin: No petechial rash or dryness.  Appeared moist.         Lab Results: Lab Results  Component Value Date   WBC 7.2 06/05/2019   HGB 12.2 06/05/2019   HCT 38.4 06/05/2019   MCV 101.9 (H) 06/05/2019   PLT 175 06/05/2019     Chemistry      Component Value Date/Time   NA 140 06/05/2019 0933   NA 139 08/23/2017 0933   NA 140 11/01/2016 0818   K 4.8 06/05/2019 0933   K 4.4 11/01/2016 0818   CL 109 06/05/2019 0933   CL 107 04/16/2012 0923   CO2 23 06/05/2019 0933   CO2 26 11/01/2016 0818   BUN 28 (H) 06/05/2019 0933   BUN 24 08/23/2017 0933   BUN 17.9 11/01/2016 0818   CREATININE 1.35 (H) 06/05/2019 0933   CREATININE 1.1 11/01/2016 0818      Component Value Date/Time   CALCIUM 9.9 06/05/2019 0933   CALCIUM 10.3 11/01/2016 0818   ALKPHOS 65 06/05/2019 0933   ALKPHOS 72 11/01/2016 0818   AST 43 (H) 06/05/2019 0933   AST 75 (H) 11/01/2016 0818   ALT 34 06/05/2019 0933   ALT 75 (H) 11/01/2016 0818   BILITOT 0.6 06/05/2019 0933   BILITOT 0.78 11/01/2016 0818       IMPRESSION: 1. Status post left nephrectomy and right inter/lower pole right renal ablation. No findings of metastatic disease in the chest, abdomen, or pelvis. 2. Cirrhosis. Right cardiophrenic angle adenopathy is similar to the 11/20/2018 exam, most likely reactive. Recommend attention on follow-up. 3. Aortic atherosclerosis (ICD10-I70.0), coronary artery atherosclerosis and emphysema (ICD10-J43.9). 4. Pelvic floor laxity.     Impression and Plan:   74 year old woman with:    1.    Clear-cell left renal cell carcinoma diagnosed in 2001 developed pulmonary metastasis that has been resected.    The  natural course of this disease was reviewed at this time.  Imaging studies from Jun 05, 2019 was personally reviewed and discussed with the patient.  She has no evidence of metastatic disease at this time however she is always at risk of developing cancer recurrence given the natural course of this disease.  I recommended continued active surveillance at this time with imaging studies every 6 months.  Treatment options for her kidney cancer was discussed today.  Local recurrence might require surgical resection or ablation with widespread metastatic disease might require systemic therapy.  Systemic therapy options that include immunotherapy and oral targeted therapy were reiterated.  At this time she does not require any treatment and will continue with active surveillance.  2.    Right  kidney tumor: She is status post cryoablation without any evidence of recurrence at this time.  Plan is to repeat imaging studies in 6 months for surveillance purposes  3.  Follow-up: In 6 months for repeat follow-up.  30  minutes were dedicated to this visit. The time was spent on reviewing laboratory data, imaging studies, discussing treatment options, and answering questions regarding future plan.   Zola Button, MD 06/10/2019 3:49 PM

## 2019-06-11 ENCOUNTER — Telehealth: Payer: Self-pay | Admitting: Oncology

## 2019-06-11 NOTE — Telephone Encounter (Signed)
Scheduled appt per 5/11 los.  A calendar will be printed and mailed

## 2019-06-17 DIAGNOSIS — R69 Illness, unspecified: Secondary | ICD-10-CM | POA: Diagnosis not present

## 2019-06-18 ENCOUNTER — Other Ambulatory Visit: Payer: Self-pay

## 2019-06-18 ENCOUNTER — Encounter: Payer: Self-pay | Admitting: *Deleted

## 2019-06-18 ENCOUNTER — Ambulatory Visit
Admission: RE | Admit: 2019-06-18 | Discharge: 2019-06-18 | Disposition: A | Discharge status: Home / Self Care | Payer: Medicare HMO | Source: Ambulatory Visit | Attending: Interventional Radiology | Admitting: Interventional Radiology

## 2019-06-18 DIAGNOSIS — Z9889 Other specified postprocedural states: Secondary | ICD-10-CM | POA: Diagnosis not present

## 2019-06-18 DIAGNOSIS — C641 Malignant neoplasm of right kidney, except renal pelvis: Secondary | ICD-10-CM | POA: Diagnosis not present

## 2019-06-18 DIAGNOSIS — N2889 Other specified disorders of kidney and ureter: Secondary | ICD-10-CM

## 2019-06-18 DIAGNOSIS — Z905 Acquired absence of kidney: Secondary | ICD-10-CM | POA: Diagnosis not present

## 2019-06-18 DIAGNOSIS — Z85528 Personal history of other malignant neoplasm of kidney: Secondary | ICD-10-CM | POA: Diagnosis not present

## 2019-06-18 HISTORY — PX: IR RADIOLOGIST EVAL & MGMT: IMG5224

## 2019-06-18 NOTE — Progress Notes (Signed)
Patient ID: Amanda Crawford, female   DOB: Feb 20, 1945, 74 y.o.   MRN: [id number]       Chief Complaint: Patient was consulted remotely today ([place]) for f/u cryoablation R renal neoplasm at the request of Dachelle Molzahn.    Referring Physician(s): Shadad  History of Present Illness: Amanda Crawford is a 74 y.o. female with a history of 2001 left nephrectomy for renal cell carcinoma   by [name], with concurrent cholecystectomy by [name].  2014  Solitary lung metastases x2 were resected from the left lung by Drs.[name] and [place],  On subsequent surveillance imaging, a new right lower pole enhancing renal mass was identified concerning for renal cell carcinoma.  No regional adenopathy.  No evidence of renal vein involvement. 05/24/2018 CT demonstrates slight increase in size of 1.4 cm hypervascular right renal mass   08/21/2018 technically successful CT-guided cryoablation of renal lesion without immediate complication.  Patient was discharged home the same day.  11/20/2018 CT showed ablation defect, no residual enhancing lesion 06/05/2019 CT shows embolization of ablation defect, no evidence of residual or recurrent lesion; no new metastatic disease, stable right cardiophrenic adenopathy She is doing well.  No significant pain at the ablation site around the abdomen.  No hematuria.     Past Medical History:  Diagnosis Date   Allergic state 03/12/2015   Arthritis    Colon polyp 11/19/2014   Dermatitis 03/12/2015   Diarrhea 06/29/2016   DM (diabetes mellitus), type 2 ([place]) 06/29/2016   GERD (gastroesophageal reflux disease)    occ   Gout 03/12/2015   H/O measles    H/O mumps    Headache(784.0)    migraines   History of chicken pox 11/23/2014   Hyperglycemia 06/29/2016   Lung cancer ([place])    Migraine 11/23/2014   Obesity 11/23/2014   Pneumonia    child   Preventative health care 09/12/2015   Renal cell cancer ([place])    renal cell ca dx 9/01 and 8/08;    Shortness of breath    occ   Skin lesion of breast 03/12/2015    Past Surgical History:  Procedure Laterality Date   ABDOMINAL HYSTERECTOMY     APPENDECTOMY     CARDIAC CATHETERIZATION     yrs ago neg   CHOLECYSTECTOMY     IR RADIOLOGIST EVAL & MGMT  07/17/2018   IR RADIOLOGIST EVAL & MGMT  09/12/2018   LUNG CANCER SURGERY Left 08   RADIOLOGY WITH ANESTHESIA N/A 08/21/2018   Procedure: CT WITH ANESTHESIA RENAL CRYOABLATION;  Surgeon: Arne Cleveland, MD;  Location: [facility];  Service: Radiology;  Laterality: N/A;   RENAL MASS EXCISION Left 01   RIGHT/LEFT HEART CATH AND CORONARY ANGIOGRAPHY N/A 07/27/2016   Procedure: Right/Left Heart Cath and Coronary Angiography;  Surgeon: Leonie Man, MD;  Location: [facility];  Service: Cardiovascular;  Laterality: N/A;   THORACOTOMY Left 05/09/2012   Procedure: THORACOTOMY MAJOR;  Surgeon: Gaye Pollack, MD;  Location: [facility];  Service: Thoracic;  Laterality: Left;   VIDEO BRONCHOSCOPY N/A 05/09/2012   Procedure: VIDEO BRONCHOSCOPY;  Surgeon: Gaye Pollack, MD;  Location: [facility];  Service: Thoracic;  Laterality: N/A;   WEDGE RESECTION Left 05/09/2012   Procedure: LEFT UPPER LOBE WEDGE RESECTION;  Surgeon: Gaye Pollack, MD;  Location: [facility];  Service: Thoracic;  Laterality: Left;    Allergies: Patient has no known allergies.  Medications: Prior to Admission medications   Medication Sig Start Date End Date  Taking? Authorizing Provider  allopurinol (ZYLOPRIM) 300 MG tablet TAKE 1 TABLET BY MOUTH EVERY DAY 06/05/19   [name], MD  aspirin EC 81 MG tablet Take 81 mg by mouth daily.    [provider]  atorvastatin (LIPITOR) 20 MG tablet Take 0.5 tablets (10 mg total) by mouth daily. 03/03/19   [name], MD  Calcium Carb-Cholecalciferol (CALCIUM 600 + D PO) Take 1 tablet by mouth daily.    [provider]  carvedilol (COREG) 6.25 MG tablet TAKE 1 TABLET (6.25 MG TOTAL) BY MOUTH 2 (TWO) TIMES DAILY.  PT NEEDS AN OV FOR FUTURE REFILLS 02/04/19   Hilty, Nadean Corwin, MD  diphenhydramine-acetaminophen (TYLENOL PM) 25-500 MG TABS Take 1 tablet by mouth at bedtime as needed (sleep).     [provider]  furosemide (LASIX) 40 MG tablet TAKE 1 TABLET BY MOUTH EVERY DAY 03/18/19   Hilty, Nadean Corwin, MD  glimepiride (AMARYL) 2 MG tablet Take 1 tablet (2 mg total) by mouth daily before breakfast. 04/28/19   [name], MD  losartan (COZAAR) 25 MG tablet Take 1 tablet (25 mg total) by mouth daily. 01/16/19   Hilty, Nadean Corwin, MD  meclizine (ANTIVERT) 12.5 MG tablet Take 1 tablet (12.5 mg total) by mouth 3 (three) times daily as needed for dizziness. As needed for   Dizziness or nausea 11/23/14   [name], MD  metFORMIN (GLUCOPHAGE) 500 MG tablet 1/2 TABLET TWICE A DAY 03/25/19   [name], MD  nystatin cream (MYCOSTATIN) Apply 1 application topically 2 (two) times daily. 06/07/17   [name], MD  Omega-3 Fatty Acids (FISH OIL) 1000 MG CAPS Take 1 capsule (1,000 mg total) by mouth 2 (two) times daily. 08/16/16   Almyra Deforest, PA  OneTouch Delica Lancets 40H MISC USE AS DIRECTED TO TEST DAILY 12/04/18   [name], MD  [facility] VERIO test strip [facility] BLOOD SUGAR DAILY AS NEEDED 11/08/18   [name], MD  Vitamin D, Ergocalciferol, (DRISDOL) 1.25 MG (50000 UNIT) CAPS capsule TAKE 1 CAPSULE (50,000 UNITS TOTAL) BY MOUTH EVERY 7 (SEVEN) DAYS. 03/14/19   [name], MD  glyBURIDE-metformin (GLUCOVANCE) 1.25-250 MG tablet Take 1 tablet by mouth 2 (two) times daily with a meal. 03/25/19 03/25/19  [name], MD     Family History  Problem Relation Age of Onset   Heart failure Father    Diabetes Father    Hyperlipidemia Father    Heart disease Father    Hypertension Father    Asthma Brother    Asthma Daughter    Cancer Paternal Grandmother    Hearing loss Paternal Grandfather    Stroke Paternal Grandfather    Alcohol abuse Maternal Aunt     Social History    Socioeconomic History   Marital status: Married    Spouse name: Not on file   Number of children: Not on file   Years of education: Not on file   Highest education level: Not on file  Occupational History   Not on file  Tobacco Use   Smoking status: Former Smoker    Packs/day: 0.50    Years: 35.00    Pack years: 17.50    Types: Cigarettes    Quit date: 05/08/2006    Years since quitting: 13.1   Smokeless tobacco: Never Used  Substance and Sexual Activity   Alcohol use: No   Drug use: No   Sexual activity: Not on file  Comment: lives with husband, no dietary restrictions.   Other Topics Concern   Not on file  Social History Narrative   Not on file   Social Determinants of Health   Financial Resource Strain:    Difficulty of Paying Living Expenses:   Food Insecurity:    Worried About Charity fundraiser in the Last Year:    Arboriculturist in the Last Year:   Transportation Needs:    Film/video editor (Medical):    Lack of Transportation (Non-Medical):   Physical Activity:    Days of Exercise per Week:    Minutes of Exercise per Session:   Stress:    Feeling of Stress :   Social Connections:    Frequency of Communication with Friends and Family:    Frequency of Social Gatherings with Friends and Family:    Attends Religious Services:    Active Member of Clubs or Organizations:    Attends Music therapist:    Marital Status:     ECOG Status: 0 - Asymptomatic  Review of Systems  Review of Systems: A 12 point ROS discussed and pertinent positives are indicated in the HPI above.  All other systems are negative.  Physical Exam No direct physical exam was performed (except for noted visual exam findings with Video Visits).     Vital Signs: There were no vitals taken for this visit.  Imaging: CT Abdomen Pelvis W Wo Contrast  Result Date: 06/05/2019 CLINICAL DATA:  Stage IV renal cell carcinoma. Diagnosed in 2001  with lung metastasis in 2008, 14, 20. Diverticulitis. Asymptomatic. EXAM: CT CHEST WITH CONTRAST CT ABDOMEN AND PELVIS WITH AND WITHOUT CONTRAST TECHNIQUE: Multidetector CT imaging of the chest was performed during intravenous contrast administration. Multidetector CT imaging of the abdomen and pelvis was performed following the standard protocol before and during bolus administration of intravenous contrast. CONTRAST:  75mL OMNIPAQUE IOHEXOL 300 MG/ML  SOLN COMPARISON:  11/20/2018 abdominal CT. Most recent chest and pelvic CTs of 11/29/2017. FINDINGS: CT CHEST FINDINGS Cardiovascular: Aortic atherosclerosis. Normal heart size, without pericardial effusion. Multivessel coronary artery atherosclerosis. No central pulmonary embolism, on this non-dedicated study. Mediastinum/Nodes: No supraclavicular adenopathy. No mediastinal or hilar adenopathy. Right cardiophrenic angle node measures 1.5 x 0.9 cm on 68/11. Similar to on the 11/20/2018 exam (when remeasured). Lungs/Pleura: No pleural fluid. Left upper lobe wedge resection. Mild centrilobular and paraseptal emphysema. Right lower lobe scarring.  Right hemidiaphragm elevation. Musculoskeletal: Lower cervical spondylosis. CT ABDOMEN AND PELVIS FINDINGS Hepatobiliary: Advanced cirrhosis. A too small to characterize 6 mm high right hepatic lobe lesion on 55/11 similar to on the most recent exam. Moderate hepatomegaly at 19.5 cm craniocaudal. Cholecystectomy, without biliary ductal dilatation. Pancreas: Fatty replacement throughout portions of the pancreatic head and uncinate process. No duct dilatation or acute inflammation. Spleen: Normal in size, without focal abnormality. Adrenals/Urinary Tract: Normal adrenal glands. Left nephrectomy, without locally recurrent disease. Too small to characterize upper pole right renal lesion. Volume loss within the inter/lower pole right kidney laterally, without findings of locally recurrent disease. No hydronephrosis. Vascular  calcifications are immediately adjacent to the distal right ureter. Normal urinary bladder. Stomach/Bowel: Normal stomach, without wall thickening. Extensive colonic diverticulosis. Normal terminal ileum. Normal small bowel. Vascular/Lymphatic: Advanced aortic and branch vessel atherosclerosis. Patent portal, splenic veins. No specific evidence of portal venous hypertension. Mild upper abdominal adenopathy is similar and likely related to cirrhosis. Example portacaval node at 1.3 cm on 102/11. No pelvic sidewall adenopathy. Reproductive: Hysterectomy.  No adnexal mass. Other: No significant free fluid.  Moderate pelvic floor laxity. Musculoskeletal: Mild osteopenia.  Lumbosacral spondylosis. IMPRESSION: 1. Status post left nephrectomy and right inter/lower pole right renal ablation. No findings of metastatic disease in the chest, abdomen, or pelvis. 2. Cirrhosis. Right cardiophrenic angle adenopathy is similar to the 11/20/2018 exam, most likely reactive. Recommend attention on follow-up. 3. Aortic atherosclerosis (ICD10-I70.0), coronary artery atherosclerosis and emphysema (ICD10-J43.9). 4. Pelvic floor laxity. Electronically Signed   By: Abigail Miyamoto M.D.   On: 06/05/2019 12:38   CT Chest W Contrast  Result Date: 06/05/2019 CLINICAL DATA:  Stage IV renal cell carcinoma. Diagnosed in 2001 with lung metastasis in 2008, 14, 20. Diverticulitis. Asymptomatic. EXAM: CT CHEST WITH CONTRAST CT ABDOMEN AND PELVIS WITH AND WITHOUT CONTRAST TECHNIQUE: Multidetector CT imaging of the chest was performed during intravenous contrast administration. Multidetector CT imaging of the abdomen and pelvis was performed following the standard protocol before and during bolus administration of intravenous contrast. CONTRAST:  89mL OMNIPAQUE IOHEXOL 300 MG/ML  SOLN COMPARISON:  11/20/2018 abdominal CT. Most recent chest and pelvic CTs of 11/29/2017. FINDINGS: CT CHEST FINDINGS Cardiovascular: Aortic atherosclerosis. Normal heart size,  without pericardial effusion. Multivessel coronary artery atherosclerosis. No central pulmonary embolism, on this non-dedicated study. Mediastinum/Nodes: No supraclavicular adenopathy. No mediastinal or hilar adenopathy. Right cardiophrenic angle node measures 1.5 x 0.9 cm on 68/11. Similar to on the 11/20/2018 exam (when remeasured). Lungs/Pleura: No pleural fluid. Left upper lobe wedge resection. Mild centrilobular and paraseptal emphysema. Right lower lobe scarring.  Right hemidiaphragm elevation. Musculoskeletal: Lower cervical spondylosis. CT ABDOMEN AND PELVIS FINDINGS Hepatobiliary: Advanced cirrhosis. A too small to characterize 6 mm high right hepatic lobe lesion on 55/11 similar to on the most recent exam. Moderate hepatomegaly at 19.5 cm craniocaudal. Cholecystectomy, without biliary ductal dilatation. Pancreas: Fatty replacement throughout portions of the pancreatic head and uncinate process. No duct dilatation or acute inflammation. Spleen: Normal in size, without focal abnormality. Adrenals/Urinary Tract: Normal adrenal glands. Left nephrectomy, without locally recurrent disease. Too small to characterize upper pole right renal lesion. Volume loss within the inter/lower pole right kidney laterally, without findings of locally recurrent disease. No hydronephrosis. Vascular calcifications are immediately adjacent to the distal right ureter. Normal urinary bladder. Stomach/Bowel: Normal stomach, without wall thickening. Extensive colonic diverticulosis. Normal terminal ileum. Normal small bowel. Vascular/Lymphatic: Advanced aortic and branch vessel atherosclerosis. Patent portal, splenic veins. No specific evidence of portal venous hypertension. Mild upper abdominal adenopathy is similar and likely related to cirrhosis. Example portacaval node at 1.3 cm on 102/11. No pelvic sidewall adenopathy. Reproductive: Hysterectomy.  No adnexal mass. Other: No significant free fluid.  Moderate pelvic floor laxity.  Musculoskeletal: Mild osteopenia.  Lumbosacral spondylosis. IMPRESSION: 1. Status post left nephrectomy and right inter/lower pole right renal ablation. No findings of metastatic disease in the chest, abdomen, or pelvis. 2. Cirrhosis. Right cardiophrenic angle adenopathy is similar to the 11/20/2018 exam, most likely reactive. Recommend attention on follow-up. 3. Aortic atherosclerosis (ICD10-I70.0), coronary artery atherosclerosis and emphysema (ICD10-J43.9). 4. Pelvic floor laxity. Electronically Signed   By: Abigail Miyamoto M.D.   On: 06/05/2019 12:38    Labs:  CBC: Recent Labs    01/17/19 0941 03/25/19 1106 04/28/19 1138 06/05/19 0933  WBC 8.6 10.4 8.3 7.2  HGB 12.4 12.4 12.1 12.2  HCT 38.1 38.0 36.2 38.4  PLT 222.0 205.0 185.0 175    COAGS: Recent Labs    08/19/18 1413  INR 1.1    BMP: Recent Labs  08/19/18 1413 08/19/18 1413 08/21/18 0736 08/21/18 0736 08/22/18 0415 09/17/18 1100 01/17/19 0941 03/25/19 1106 04/28/19 1138 06/05/19 0933  NA 137   < > 138   < > 138   < > 136 137 137 140  K 4.4   < > 4.5   < > 4.8   < > 4.5 4.8 5.2* 4.8  CL 101   < > 105   < > 103   < > 101 100 102 109  CO2 25   < > 21*   < > 23   < > 27 28 27 23   GLUCOSE 98   < > 153*   < > 161*   < > 146* 127* 142* 127*  BUN 21   < > 25*   < > 30*   < > 34* 27* 24* 28*  CALCIUM 9.9   < > 9.8   < > 9.6   < > 10.5 10.7* 10.3 9.9  CREATININE 1.13*   < > 1.00   < > 1.24*   < > 1.39* 1.29* 1.26* 1.35*  GFRNONAA 48*  --  55*  --  42*  --   --   --   --  38*  GFRAA 55*  --  >60  --  49*  --   --   --   --  44*   < > = values in this interval not displayed.   Creatinine 1.35 06/05/2019  LIVER FUNCTION TESTS: Recent Labs    01/17/19 0941 03/25/19 1106 04/28/19 1138 06/05/19 0933  BILITOT 0.6 1.0 1.0 0.6  AST 50* 50* 49* 43*  ALT 41* 43* 39* 34  ALKPHOS 61 64 61 65  PROT 7.8 7.6 7.2 7.6  ALBUMIN 4.2 4.4 4.2 3.9    TUMOR MARKERS: No results for input(s): AFPTM, CEA, CA199, CHROMGRNA in the  last 8760 hours.  Assessment and Plan:  My impression is that the patient is doing very well post cryoablation of right renal neoplasm in this patient with a history of left nephrectomy.  Only minimal bump in her creatinine.  No evidence of residual/recurrent lesion on follow-up imaging.  Plan: Follow-up CT renal protocol in 6 months.  If this looks good, we can switch to annual for total of 5 years surveillance. Follow creatinine. Follow-up with me after  November 2021 scan, clinical or virtual. She knows to telephone in the interval with any questions or problems related to the right renal cryoablation.  Thank you for this interesting consult.  I greatly enjoyed meeting MARENA WITTS and look forward to participating in their care.  A copy of this report was sent to the requesting provider on this date.  Electronically Signed: Rickard Rhymes 06/18/2019, 11:14 AM   I spent a total of    15 Minutes in remote  clinical consultation, greater than 50% of which was counseling/coordinating care for right renal neoplasm, post cryoablation.  Visit type: Audio only (telephone). Audio (no video) only due to patient's lack of internet/smartphone capability. Alternative for in-person consultation at [facility], [address], [place], [place]. This visit type was conducted due to national recommendations for restrictions regarding the COVID-19 Pandemic (e.g. social distancing).  This format is felt to be most appropriate for this patient at this time.  All issues noted in this document were discussed and addressed.

## 2019-07-03 DIAGNOSIS — R69 Illness, unspecified: Secondary | ICD-10-CM | POA: Diagnosis not present

## 2019-07-07 ENCOUNTER — Encounter: Payer: Self-pay | Admitting: Gastroenterology

## 2019-07-07 ENCOUNTER — Encounter: Payer: Self-pay | Admitting: Family Medicine

## 2019-07-07 ENCOUNTER — Other Ambulatory Visit: Payer: Self-pay

## 2019-07-07 ENCOUNTER — Ambulatory Visit (INDEPENDENT_AMBULATORY_CARE_PROVIDER_SITE_OTHER): Payer: Medicare HMO | Admitting: Family Medicine

## 2019-07-07 VITALS — BP 102/58 | HR 82 | Temp 98.2°F | Resp 12 | Ht 64.0 in | Wt 217.2 lb

## 2019-07-07 DIAGNOSIS — M8580 Other specified disorders of bone density and structure, unspecified site: Secondary | ICD-10-CM | POA: Diagnosis not present

## 2019-07-07 DIAGNOSIS — E785 Hyperlipidemia, unspecified: Secondary | ICD-10-CM | POA: Diagnosis not present

## 2019-07-07 DIAGNOSIS — R7989 Other specified abnormal findings of blood chemistry: Secondary | ICD-10-CM

## 2019-07-07 DIAGNOSIS — M1A9XX0 Chronic gout, unspecified, without tophus (tophi): Secondary | ICD-10-CM | POA: Diagnosis not present

## 2019-07-07 DIAGNOSIS — Z86010 Personal history of colonic polyps: Secondary | ICD-10-CM

## 2019-07-07 DIAGNOSIS — I5022 Chronic systolic (congestive) heart failure: Secondary | ICD-10-CM

## 2019-07-07 DIAGNOSIS — R109 Unspecified abdominal pain: Secondary | ICD-10-CM

## 2019-07-07 DIAGNOSIS — N289 Disorder of kidney and ureter, unspecified: Secondary | ICD-10-CM

## 2019-07-07 DIAGNOSIS — E119 Type 2 diabetes mellitus without complications: Secondary | ICD-10-CM | POA: Diagnosis not present

## 2019-07-07 DIAGNOSIS — D126 Benign neoplasm of colon, unspecified: Secondary | ICD-10-CM | POA: Diagnosis not present

## 2019-07-07 MED ORDER — HYOSCYAMINE SULFATE 0.125 MG SL SUBL: 0.1250 mg | SUBLINGUAL_TABLET | Freq: Four times a day (QID) | SUBLINGUAL | 0 refills | Status: DC | PRN

## 2019-07-07 NOTE — Assessment & Plan Note (Signed)
No recent exacerbation, no change in meds

## 2019-07-07 NOTE — Patient Instructions (Signed)
Carbohydrate Counting for Diabetes Mellitus, Adult  Carbohydrate counting is a method of keeping track of how many carbohydrates you eat. Eating carbohydrates naturally increases the amount of sugar (glucose) in the blood. Counting how many carbohydrates you eat helps keep your blood glucose within normal limits, which helps you manage your diabetes (diabetes mellitus). It is important to know how many carbohydrates you can safely have in each meal. This is different for every person. A diet and nutrition specialist (registered dietitian) can help you make a meal plan and calculate how many carbohydrates you should have at each meal and snack. Carbohydrates are found in the following foods:  Grains, such as breads and cereals.  Dried beans and soy products.  Starchy vegetables, such as potatoes, peas, and corn.  Fruit and fruit juices.  Milk and yogurt.  Sweets and snack foods, such as cake, cookies, candy, chips, and soft drinks. How do I count carbohydrates? There are two ways to count carbohydrates in food. You can use either of the methods or a combination of both. Reading "Nutrition Facts" on packaged food The "Nutrition Facts" list is included on the labels of almost all packaged foods and beverages in the [place] It includes:  The serving size.  Information about nutrients in each serving, including the grams (g) of carbohydrate per serving. To use the "Nutrition Facts":  Decide how many servings you will have.  Multiply the number of servings by the number of carbohydrates per serving.  The resulting number is the total amount of carbohydrates that you will be having. Learning standard serving sizes of other foods When you eat carbohydrate foods that are not packaged or do not include "Nutrition Facts" on the label, you need to measure the servings in order to count the amount of carbohydrates:  Measure the foods that you will eat with a food scale or measuring cup, if  needed.  Decide how many standard-size servings you will eat.  Multiply the number of servings by 15. Most carbohydrate-rich foods have about 15 g of carbohydrates per serving. ? For example, if you eat 8 oz (170 g) of strawberries, you will have eaten 2 servings and 30 g of carbohydrates (2 servings x 15 g = 30 g).  For foods that have more than one food mixed, such as soups and casseroles, you must count the carbohydrates in each food that is included. The following list contains standard serving sizes of common carbohydrate-rich foods. Each of these servings has about 15 g of carbohydrates:   hamburger bun or  English muffin.   oz (15 mL) syrup.   oz (14 g) jelly.  1 slice of bread.  1 six-inch tortilla.  3 oz (85 g) cooked rice or pasta.  4 oz (113 g) cooked dried beans.  4 oz (113 g) starchy vegetable, such as peas, corn, or potatoes.  4 oz (113 g) hot cereal.  4 oz (113 g) mashed potatoes or  of a large baked potato.  4 oz (113 g) canned or frozen fruit.  4 oz (120 mL) fruit juice.  4-6 crackers.  6 chicken nuggets.  6 oz (170 g) unsweetened dry cereal.  6 oz (170 g) plain fat-free yogurt or yogurt sweetened with artificial sweeteners.  8 oz (240 mL) milk.  8 oz (170 g) fresh fruit or one small piece of fruit.  24 oz (680 g) popped popcorn. Example of carbohydrate counting Sample meal  3 oz (85 g) chicken breast.  6 oz (170 g)   brown rice.  4 oz (113 g) corn.  8 oz (240 mL) milk.  8 oz (170 g) strawberries with sugar-free whipped topping. Carbohydrate calculation 1. Identify the foods that contain carbohydrates: ? Rice. ? Corn. ? Milk. ? Strawberries. 2. Calculate how many servings you have of each food: ? 2 servings rice. ? 1 serving corn. ? 1 serving milk. ? 1 serving strawberries. 3. Multiply each number of servings by 15 g: ? 2 servings rice x 15 g = 30 g. ? 1 serving corn x 15 g = 15 g. ? 1 serving milk x 15 g = 15 g. ? 1  serving strawberries x 15 g = 15 g. 4. Add together all of the amounts to find the total grams of carbohydrates eaten: ? 30 g + 15 g + 15 g + 15 g = 75 g of carbohydrates total. Summary  Carbohydrate counting is a method of keeping track of how many carbohydrates you eat.  Eating carbohydrates naturally increases the amount of sugar (glucose) in the blood.  Counting how many carbohydrates you eat helps keep your blood glucose within normal limits, which helps you manage your diabetes.  A diet and nutrition specialist (registered dietitian) can help you make a meal plan and calculate how many carbohydrates you should have at each meal and snack. This information is not intended to replace advice given to you by your health care provider. Make sure you discuss any questions you have with your health care provider. Document Revised: 08/10/2016 Document Reviewed: 06/30/2015 Elsevier Patient Education  2020 [employer]  

## 2019-07-07 NOTE — Assessment & Plan Note (Signed)
Hydrate and monitor uric acid, no recent outbreak

## 2019-07-07 NOTE — Assessment & Plan Note (Signed)
Hydrate and monitor 

## 2019-07-07 NOTE — Progress Notes (Signed)
Patient ID: Amanda Crawford, female   DOB: 1945/03/11, 74 y.o.   MRN: [id number]   Subjective:    Patient ID: Amanda Crawford, female    DOB: 29-Dec-1945, 74 y.o.   MRN: [id number]  Chief Complaint  Patient presents with   2 month follow up    HPI Patient is in today for follow up on chronic medical concerns. No recent febrile illness or hospitalizations. She has been very stressed as her husband has been very ill. He had open heart surgery with CABG x 4, AVR and pacer placement. He has recovered well though and the patient herself feels well. Her greatest concern is GI issues. Intermittent abdominal pain with diarrhea and constipation alternating. No bloody or tarry stool. Denies CP/palp/SOB/HA/congestion/fevers/GI or GU c/o. Taking meds as prescribed  Past Medical History:  Diagnosis Date   Allergic state 03/12/2015   Arthritis    Colon polyp 11/19/2014   Dermatitis 03/12/2015   Diarrhea 06/29/2016   DM (diabetes mellitus), type 2 ([place]) 06/29/2016   GERD (gastroesophageal reflux disease)    occ   Gout 03/12/2015   H/O measles    H/O mumps    Headache(784.0)    migraines   History of chicken pox 11/23/2014   Hyperglycemia 06/29/2016   Lung cancer ([place])    Migraine 11/23/2014   Obesity 11/23/2014   Pneumonia    child   Preventative health care 09/12/2015   Renal cell cancer ([place])    renal cell ca dx 9/01 and 8/08;   Shortness of breath    occ   Skin lesion of breast 03/12/2015    Past Surgical History:  Procedure Laterality Date   ABDOMINAL HYSTERECTOMY     APPENDECTOMY     CARDIAC CATHETERIZATION     yrs ago neg   CHOLECYSTECTOMY     IR RADIOLOGIST EVAL & MGMT  07/17/2018   IR RADIOLOGIST EVAL & MGMT  09/12/2018   IR RADIOLOGIST EVAL & MGMT  06/18/2019   LUNG CANCER SURGERY Left 08   RADIOLOGY WITH ANESTHESIA N/A 08/21/2018   Procedure: CT WITH ANESTHESIA RENAL CRYOABLATION;  Surgeon: Arne Cleveland, MD;  Location: [facility];  Service: Radiology;   Laterality: N/A;   RENAL MASS EXCISION Left 01   RIGHT/LEFT HEART CATH AND CORONARY ANGIOGRAPHY N/A 07/27/2016   Procedure: Right/Left Heart Cath and Coronary Angiography;  Surgeon: Leonie Man, MD;  Location: [facility];  Service: Cardiovascular;  Laterality: N/A;   THORACOTOMY Left 05/09/2012   Procedure: THORACOTOMY MAJOR;  Surgeon: Gaye Pollack, MD;  Location: [facility];  Service: Thoracic;  Laterality: Left;   VIDEO BRONCHOSCOPY N/A 05/09/2012   Procedure: VIDEO BRONCHOSCOPY;  Surgeon: Gaye Pollack, MD;  Location: [facility];  Service: Thoracic;  Laterality: N/A;   WEDGE RESECTION Left 05/09/2012   Procedure: LEFT UPPER LOBE WEDGE RESECTION;  Surgeon: Gaye Pollack, MD;  Location: [facility];  Service: Thoracic;  Laterality: Left;    Family History  Problem Relation Age of Onset   Heart failure Father    Diabetes Father    Hyperlipidemia Father    Heart disease Father    Hypertension Father    Asthma Brother    Asthma Daughter    Cancer Paternal Grandmother    Hearing loss Paternal Grandfather    Stroke Paternal Grandfather    Alcohol abuse Maternal Aunt     Social History   Socioeconomic History   Marital status: Married    Spouse name: Not on  file   Number of children: Not on file   Years of education: Not on file   Highest education level: Not on file  Occupational History   Not on file  Tobacco Use   Smoking status: Former Smoker    Packs/day: 0.50    Years: 35.00    Pack years: 17.50    Types: Cigarettes    Quit date: 05/08/2006    Years since quitting: 13.1   Smokeless tobacco: Never Used  Substance and Sexual Activity   Alcohol use: No   Drug use: No   Sexual activity: Not on file    Comment: lives with husband, no dietary restrictions.   Other Topics Concern   Not on file  Social History Narrative   Not on file   Social Determinants of Health   Financial Resource Strain:    Difficulty of Paying Living Expenses:   Food  Insecurity:    Worried About Charity fundraiser in the Last Year:    Arboriculturist in the Last Year:   Transportation Needs:    Film/video editor (Medical):    Lack of Transportation (Non-Medical):   Physical Activity:    Days of Exercise per Week:    Minutes of Exercise per Session:   Stress:    Feeling of Stress :   Social Connections:    Frequency of Communication with Friends and Family:    Frequency of Social Gatherings with Friends and Family:    Attends Religious Services:    Active Member of Clubs or Organizations:    Attends Music therapist:    Marital Status:   Intimate Partner Violence:    Fear of Current or Ex-Partner:    Emotionally Abused:    Physically Abused:    Sexually Abused:     Outpatient Medications Prior to Visit  Medication Sig Dispense Refill   allopurinol (ZYLOPRIM) 300 MG tablet TAKE 1 TABLET BY MOUTH EVERY DAY 90 tablet 1   aspirin EC 81 MG tablet Take 81 mg by mouth daily.     atorvastatin (LIPITOR) 20 MG tablet Take 0.5 tablets (10 mg total) by mouth daily. 90 tablet 3   Calcium Carb-Cholecalciferol (CALCIUM 600 + D PO) Take 1 tablet by mouth daily.     carvedilol (COREG) 6.25 MG tablet TAKE 1 TABLET (6.25 MG TOTAL) BY MOUTH 2 (TWO) TIMES DAILY. PT NEEDS AN OV FOR FUTURE REFILLS 30 tablet 5   diphenhydramine-acetaminophen (TYLENOL PM) 25-500 MG TABS Take 1 tablet by mouth at bedtime as needed (sleep).      furosemide (LASIX) 40 MG tablet TAKE 1 TABLET BY MOUTH EVERY DAY 90 tablet 1   glimepiride (AMARYL) 2 MG tablet Take 1 tablet (2 mg total) by mouth daily before breakfast. 30 tablet 3   losartan (COZAAR) 25 MG tablet Take 1 tablet (25 mg total) by mouth daily. 90 tablet 1   meclizine (ANTIVERT) 12.5 MG tablet Take 1 tablet (12.5 mg total) by mouth 3 (three) times daily as needed for dizziness. As needed for   Dizziness or nausea 30 tablet 1   metFORMIN (GLUCOPHAGE) 500 MG tablet 1/2 TABLET TWICE A  DAY 90 tablet 1   nystatin cream (MYCOSTATIN) Apply 1 application topically 2 (two) times daily. 30 g 1   Omega-3 Fatty Acids (FISH OIL) 1000 MG CAPS Take 1 capsule (1,000 mg total) by mouth 2 (two) times daily.  0   OneTouch Delica Lancets 10U MISC USE AS DIRECTED TO  TEST DAILY 100 each 2   ONETOUCH VERIO test strip CEHCK BLOOD SUGAR DAILY AS NEEDED 100 strip 2   Vitamin D, Ergocalciferol, (DRISDOL) 1.25 MG (50000 UNIT) CAPS capsule TAKE 1 CAPSULE (50,000 UNITS TOTAL) BY MOUTH EVERY 7 (SEVEN) DAYS. 12 capsule 1   No facility-administered medications prior to visit.    No Known Allergies  Review of Systems  Constitutional: Positive for malaise/fatigue. Negative for fever.  HENT: Negative for congestion.   Eyes: Negative for blurred vision.  Respiratory: Negative for shortness of breath.   Cardiovascular: Negative for chest pain, palpitations and leg swelling.  Gastrointestinal: Positive for abdominal pain, constipation and diarrhea. Negative for blood in stool and nausea.  Genitourinary: Negative for dysuria and frequency.  Musculoskeletal: Negative for falls.  Skin: Negative for rash.  Neurological: Negative for dizziness, loss of consciousness and headaches.  Endo/Heme/Allergies: Negative for environmental allergies.  Psychiatric/Behavioral: Negative for depression. The patient is nervous/anxious.        Objective:    Physical Exam Vitals and nursing note reviewed.  Constitutional:      General: She is not in acute distress.    Appearance: She is well-developed.  HENT:     Head: Normocephalic and atraumatic.     Nose: Nose normal.  Eyes:     General:        Right eye: No discharge.        Left eye: No discharge.  Cardiovascular:     Rate and Rhythm: Normal rate and regular rhythm.     Heart sounds: No murmur.  Pulmonary:     Effort: Pulmonary effort is normal.     Breath sounds: Normal breath sounds.  Abdominal:     General: Bowel sounds are normal.      Palpations: Abdomen is soft.     Tenderness: There is no abdominal tenderness.  Musculoskeletal:     Cervical back: Normal range of motion and neck supple.  Skin:    General: Skin is warm and dry.  Neurological:     Mental Status: She is alert and oriented to person, place, and time.     BP (!) 102/58 (BP Location: Left Arm, Cuff Size: Large)    Pulse 82    Temp 98.2 F (36.8 C) (Temporal)    Resp 12    Ht _0  (1.626 m)    Wt 217 lb 3.2 oz (98.5 kg)    SpO2 97%    BMI 37.28 kg/m  Wt Readings from Last 3 Encounters:  07/07/19 217 lb 3.2 oz (98.5 kg)  06/10/19 213 lb 14.4 oz (97 kg)  04/28/19 213 lb 9.6 oz (96.9 kg)    Diabetic Foot Exam - Simple   No data filed     Lab Results  Component Value Date   WBC 7.2 06/05/2019   HGB 12.2 06/05/2019   HCT 38.4 06/05/2019   PLT 175 06/05/2019   GLUCOSE 127 (H) 06/05/2019   CHOL 187 04/28/2019   TRIG 344.0 (H) 04/28/2019   HDL 46.80 04/28/2019   LDLDIRECT 74.0 04/28/2019   LDLCALC (H) 01/31/2007    117        Total Cholesterol/HDL:CHD Risk Coronary Heart Disease Risk Table                     Men   Women  1/2 Average Risk   3.4   3.3  Average Risk       5.0   4.4  2 X Average  Risk   9.6   7.1  3 X Average Risk  23.4   11.0        Use the calculated Patient Ratio above and the CHD Risk Table to determine the patient's CHD Risk.        ATP III CLASSIFICATION (LDL):  <100     mg/dL   Optimal  100-129  mg/dL   Near or Above                    Optimal  130-159  mg/dL   Borderline  160-189  mg/dL   High  >190     mg/dL   Very High   ALT 34 06/05/2019   AST 43 (H) 06/05/2019   NA 140 06/05/2019   K 4.8 06/05/2019   CL 109 06/05/2019   CREATININE 1.35 (H) 06/05/2019   BUN 28 (H) 06/05/2019   CO2 23 06/05/2019   TSH 2.56 04/28/2019   INR 1.1 08/19/2018   HGBA1C 6.9 (H) 04/28/2019    Lab Results  Component Value Date   TSH 2.56 04/28/2019   Lab Results  Component Value Date   WBC 7.2 06/05/2019   HGB 12.2  06/05/2019   HCT 38.4 06/05/2019   MCV 101.9 (H) 06/05/2019   PLT 175 06/05/2019   Lab Results  Component Value Date   NA 140 06/05/2019   K 4.8 06/05/2019   CHLORIDE 104 11/01/2016   CO2 23 06/05/2019   GLUCOSE 127 (H) 06/05/2019   BUN 28 (H) 06/05/2019   CREATININE 1.35 (H) 06/05/2019   BILITOT 0.6 06/05/2019   ALKPHOS 65 06/05/2019   AST 43 (H) 06/05/2019   ALT 34 06/05/2019   PROT 7.6 06/05/2019   ALBUMIN 3.9 06/05/2019   CALCIUM 9.9 06/05/2019   ANIONGAP 8 06/05/2019   EGFR 50 (L) 11/01/2016   GFR 41.29 (L) 04/28/2019   Lab Results  Component Value Date   CHOL 187 04/28/2019   Lab Results  Component Value Date   HDL 46.80 04/28/2019   Lab Results  Component Value Date   LDLCALC (H) 01/31/2007    117        Total Cholesterol/HDL:CHD Risk Coronary Heart Disease Risk Table                     Men   Women  1/2 Average Risk   3.4   3.3  Average Risk       5.0   4.4  2 X Average Risk   9.6   7.1  3 X Average Risk  23.4   11.0        Use the calculated Patient Ratio above and the CHD Risk Table to determine the patient's CHD Risk.        ATP III CLASSIFICATION (LDL):  <100     mg/dL   Optimal  100-129  mg/dL   Near or Above                    Optimal  130-159  mg/dL   Borderline  160-189  mg/dL   High  >190     mg/dL   Very High   Lab Results  Component Value Date   TRIG 344.0 (H) 04/28/2019   Lab Results  Component Value Date   CHOLHDL 4 04/28/2019   Lab Results  Component Value Date   HGBA1C 6.9 (H) 04/28/2019       Assessment & Plan:  Problem List Items Addressed This Visit    Hx of colonic polyp    [facility]s with [facility] and she lives in [place]. Is due for repeat colonoscopy on 3 year recall this fall but continues to note bowel changes with intermittent abdominal pain, diarrhea and constipation. Is given a course of Hyoscyamine to use prn for pain and referred back to GI for further consideration      Hyperlipidemia     Encouraged heart healthy diet, increase exercise, avoid trans fats, consider a krill oil cap daily. Tolerating Atorvastatin      Relevant Orders   Lipid panel   Lipid panel   Osteopenia    Encouraged to get adequate exercise, calcium and vitamin d intake      Low serum vitamin D    Supplement and monitor      Relevant Orders   VITAMIN D 25 Hydroxy (Vit-D Deficiency, Fractures)   VITAMIN D 25 Hydroxy (Vit-D Deficiency, Fractures)   Gout    Hydrate and monitor uric acid, no recent outbreak      Relevant Orders   Uric acid   Uric acid   DM (diabetes mellitus), type 2 (HCC)    hgba1c acceptable, minimize simple carbs. Increase exercise as tolerated. Continue current meds. She brings in her Glucometer. She notes her blood sugars fasting have been running 140s to 160s the last couple of days prior to that they were 110s and 130s. No polyuria but does note polydipsia      Relevant Orders   Hemoglobin A1c   Comprehensive metabolic panel   Hemoglobin K0O   Chronic systolic heart failure (HCC) - Primary    No recent exacerbation, no change in meds      Relevant Orders   CBC   TSH   CBC   TSH   Renal insufficiency    Hydrate and monitor      Relevant Orders   Comprehensive metabolic panel    Other Visit Diagnoses    Abdominal pain, unspecified abdominal location       Relevant Orders   Ambulatory referral to Gastroenterology      I am having Amanda Crawford. Partain start on hyoscyamine. I am also having her maintain her diphenhydramine-acetaminophen, meclizine, aspirin EC, Fish Oil, nystatin cream, Calcium Carb-Cholecalciferol (CALCIUM 600 + D PO), OneTouch Verio, OneTouch Delica Lancets 77C, losartan, carvedilol, atorvastatin, Vitamin D (Ergocalciferol), furosemide, metFORMIN, glimepiride, and allopurinol.  Meds ordered this encounter  Medications   hyoscyamine (LEVSIN SL) 0.125 MG SL tablet    Sig: Place 1 tablet (0.125 mg total) under the tongue every 6 (six) hours as  needed.    Dispense:  30 tablet    Refill:  0     Penni Homans, MD

## 2019-07-07 NOTE — Assessment & Plan Note (Signed)
Follows with [facility] and she lives in [place]. Is due for repeat colonoscopy on 3 year recall this fall but continues to note bowel changes with intermittent abdominal pain, diarrhea and constipation. Is given a course of Hyoscyamine to use prn for pain and referred back to GI for further consideration

## 2019-07-07 NOTE — Assessment & Plan Note (Signed)
Encouraged heart healthy diet, increase exercise, avoid trans fats, consider a krill oil cap daily. Tolerating Atorvastatin 

## 2019-07-07 NOTE — Assessment & Plan Note (Signed)
Supplement and monitor 

## 2019-07-07 NOTE — Assessment & Plan Note (Addendum)
hgba1c acceptable, minimize simple carbs. Increase exercise as tolerated. Continue current meds. She brings in her Glucometer. She notes her blood sugars fasting have been running 140s to 160s the last couple of days prior to that they were 110s and 130s. No polyuria but does note polydipsia

## 2019-07-07 NOTE — Assessment & Plan Note (Signed)
Encouraged to get adequate exercise, calcium and vitamin d intake 

## 2019-07-10 ENCOUNTER — Other Ambulatory Visit: Payer: Self-pay | Admitting: Internal Medicine

## 2019-07-20 ENCOUNTER — Other Ambulatory Visit: Payer: Self-pay | Admitting: Family Medicine

## 2019-07-21 DIAGNOSIS — H40013 Open angle with borderline findings, low risk, bilateral: Secondary | ICD-10-CM | POA: Diagnosis not present

## 2019-07-21 DIAGNOSIS — H04123 Dry eye syndrome of bilateral lacrimal glands: Secondary | ICD-10-CM | POA: Diagnosis not present

## 2019-07-21 DIAGNOSIS — H25013 Cortical age-related cataract, bilateral: Secondary | ICD-10-CM | POA: Diagnosis not present

## 2019-07-21 DIAGNOSIS — H2513 Age-related nuclear cataract, bilateral: Secondary | ICD-10-CM | POA: Diagnosis not present

## 2019-07-24 ENCOUNTER — Telehealth: Payer: Self-pay | Admitting: Family Medicine

## 2019-07-24 MED ORDER — ATORVASTATIN CALCIUM 10 MG PO TABS
10.0000 mg | ORAL_TABLET | Freq: Every day | ORAL | 1 refills | Status: DC
Start: 2019-07-24 — End: 2019-08-06

## 2019-07-24 NOTE — Telephone Encounter (Signed)
Caller Amanda Crawford Call Back # [phone]  Per Amanda Crawford, provider decreased medication dose of atorvastatin from 20MG  to 10mg  at her last office visit but, never sent in an update prescription. Patient has been cutting pill in half per pharmacist.

## 2019-07-24 NOTE — Telephone Encounter (Signed)
New rx sent in

## 2019-07-26 ENCOUNTER — Other Ambulatory Visit: Payer: Self-pay | Admitting: Family Medicine

## 2019-07-28 DIAGNOSIS — R69 Illness, unspecified: Secondary | ICD-10-CM | POA: Diagnosis not present

## 2019-08-05 ENCOUNTER — Other Ambulatory Visit: Payer: Self-pay

## 2019-08-05 ENCOUNTER — Encounter: Payer: Self-pay | Admitting: Family Medicine

## 2019-08-05 ENCOUNTER — Ambulatory Visit (INDEPENDENT_AMBULATORY_CARE_PROVIDER_SITE_OTHER): Payer: Medicare HMO | Admitting: Family Medicine

## 2019-08-05 DIAGNOSIS — E785 Hyperlipidemia, unspecified: Secondary | ICD-10-CM

## 2019-08-05 DIAGNOSIS — M1A9XX0 Chronic gout, unspecified, without tophus (tophi): Secondary | ICD-10-CM | POA: Diagnosis not present

## 2019-08-05 DIAGNOSIS — E119 Type 2 diabetes mellitus without complications: Secondary | ICD-10-CM

## 2019-08-05 DIAGNOSIS — I5022 Chronic systolic (congestive) heart failure: Secondary | ICD-10-CM | POA: Diagnosis not present

## 2019-08-05 DIAGNOSIS — N289 Disorder of kidney and ureter, unspecified: Secondary | ICD-10-CM | POA: Diagnosis not present

## 2019-08-05 DIAGNOSIS — R197 Diarrhea, unspecified: Secondary | ICD-10-CM | POA: Diagnosis not present

## 2019-08-05 DIAGNOSIS — R7989 Other specified abnormal findings of blood chemistry: Secondary | ICD-10-CM

## 2019-08-05 LAB — TSH: TSH: 2.4 u[IU]/mL (ref 0.35–4.50)

## 2019-08-05 LAB — CBC
HCT: 37.9 % (ref 36.0–46.0)
Hemoglobin: 12.3 g/dL (ref 12.0–15.0)
MCHC: 32.5 g/dL (ref 30.0–36.0)
MCV: 99.5 fl (ref 78.0–100.0)
Platelets: 176 10*3/uL (ref 150.0–400.0)
RBC: 3.81 Mil/uL — ABNORMAL LOW (ref 3.87–5.11)
RDW: 16 % — ABNORMAL HIGH (ref 11.5–15.5)
WBC: 8.2 10*3/uL (ref 4.0–10.5)

## 2019-08-05 LAB — COMPREHENSIVE METABOLIC PANEL
ALT: 39 U/L — ABNORMAL HIGH (ref 0–35)
AST: 44 U/L — ABNORMAL HIGH (ref 0–37)
Albumin: 4.4 g/dL (ref 3.5–5.2)
Alkaline Phosphatase: 60 U/L (ref 39–117)
BUN: 43 mg/dL — ABNORMAL HIGH (ref 6–23)
CO2: 25 mEq/L (ref 19–32)
Calcium: 10.7 mg/dL — ABNORMAL HIGH (ref 8.4–10.5)
Chloride: 103 mEq/L (ref 96–112)
Creatinine, Ser: 1.42 mg/dL — ABNORMAL HIGH (ref 0.40–1.20)
GFR: 35.94 mL/min — ABNORMAL LOW (ref >60.00)
Glucose, Bld: 147 mg/dL — ABNORMAL HIGH (ref 70–99)
Potassium: 5.2 mEq/L — ABNORMAL HIGH (ref 3.5–5.1)
Sodium: 138 mEq/L (ref 135–145)
Total Bilirubin: 0.6 mg/dL (ref 0.2–1.2)
Total Protein: 7.3 g/dL (ref 6.0–8.3)

## 2019-08-05 LAB — LIPID PANEL
Cholesterol: 189 mg/dL (ref 0–200)
HDL: 52 mg/dL (ref >39.00)
NonHDL: 136.69
Total CHOL/HDL Ratio: 4
Triglycerides: 306 mg/dL — ABNORMAL HIGH (ref 0.0–149.0)
VLDL: 61.2 mg/dL — ABNORMAL HIGH (ref 0.0–40.0)

## 2019-08-05 LAB — VITAMIN D 25 HYDROXY (VIT D DEFICIENCY, FRACTURES): VITD: 66.48 ng/mL (ref 30.00–100.00)

## 2019-08-05 LAB — LDL CHOLESTEROL, DIRECT: Direct LDL: 71 mg/dL

## 2019-08-05 LAB — HEMOGLOBIN A1C: Hgb A1c MFr Bld: 6.6 % — ABNORMAL HIGH (ref 4.6–6.5)

## 2019-08-05 LAB — URIC ACID: Uric Acid, Serum: 4.5 mg/dL (ref 2.4–7.0)

## 2019-08-05 NOTE — Patient Instructions (Addendum)
Try adding Beneifiber twice a day  Imodium as needed for diarrhea  Encouraged increased hydration and fiber in diet. Daily probiotics. If bowels not moving can use MOM 2 tbls po in 4 oz of warm prune juice by mouth every 2-3 days. If no results then repeat in 4 hours with  Dulcolax suppository pr, may repeat again in 4 more hours as needed. Seek care if symptoms worsen. Consider daily Miralax and/or Dulcolax if symptoms persist.  Diarrhea, Adult Diarrhea is frequent loose and watery bowel movements. Diarrhea can make you feel weak and cause you to become dehydrated. Dehydration can make you tired and thirsty, cause you to have a dry mouth, and decrease how often you urinate. Diarrhea typically lasts 2-3 days. However, it can last longer if it is a sign of something more serious. It is important to treat your diarrhea as told by your health care provider. Follow these instructions at home: Eating and drinking   Follow these recommendations as told by your health care provider:  Take an oral rehydration solution (ORS). This is an over-the-counter medicine that helps return your body to its normal balance of nutrients and water. It is found at pharmacies and retail stores.  Drink plenty of fluids, such as water, ice chips, diluted fruit juice, and low-calorie sports drinks. You can drink milk also, if desired.  Avoid drinking fluids that contain a lot of sugar or caffeine, such as energy drinks, sports drinks, and soda.  Eat bland, easy-to-digest foods in small amounts as you are able. These foods include bananas, applesauce, rice, lean meats, toast, and crackers.  Avoid alcohol.  Avoid spicy or fatty foods.  Medicines  Take over-the-counter and prescription medicines only as told by your health care provider.  If you were prescribed an antibiotic medicine, take it as told by your health care provider. Do not stop using the antibiotic even if you start to feel better. General  instructions   Wash your hands often using soap and water. If soap and water are not available, use a hand sanitizer. Others in the household should wash their hands as well. Hands should be washed: ? After using the toilet or changing a diaper. ? Before preparing, cooking, or serving food. ? While caring for a sick person or while visiting someone in a hospital.  Drink enough fluid to keep your urine pale yellow.  Rest at home while you recover.  Watch your condition for any changes.  Take a warm bath to relieve any burning or pain from frequent diarrhea episodes.  Keep all follow-up visits as told by your health care provider. This is important. Contact a health care provider if:  You have a fever.  Your diarrhea gets worse.  You have new symptoms.  You cannot keep fluids down.  You feel light-headed or dizzy.  You have a headache.  You have muscle cramps. Get help right away if:  You have chest pain.  You feel extremely weak or you faint.  You have bloody or black stools or stools that look like tar.  You have severe pain, cramping, or bloating in your abdomen.  You have trouble breathing or you are breathing very quickly.  Your heart is beating very quickly.  Your skin feels cold and clammy.  You feel confused.  You have signs of dehydration, such as: ? Dark urine, very little urine, or no urine. ? Cracked lips. ? Dry mouth. ? Sunken eyes. ? Sleepiness. ? Weakness. Summary  Diarrhea is  frequent loose and watery bowel movements. Diarrhea can make you feel weak and cause you to become dehydrated.  Drink enough fluids to keep your urine pale yellow.  Make sure that you wash your hands after using the toilet. If soap and water are not available, use hand sanitizer.  Contact a health care provider if your diarrhea gets worse or you have new symptoms.  Get help right away if you have signs of dehydration. This information is not intended to replace  advice given to you by your health care provider. Make sure you discuss any questions you have with your health care provider. Document Revised: 06/04/2018 Document Reviewed: 06/22/2017 Elsevier Patient Education  [place].

## 2019-08-05 NOTE — Progress Notes (Signed)
Subjective:    Patient ID: Amanda Crawford, female    DOB: 1945/02/18, 74 y.o.   MRN: [id number]  Chief Complaint  Patient presents with  . Follow-up    HPI Patient is in today for follow up on chronic medical concerns. No recent febrile illness or hospitalizations. She is headed to [place] next week to visit her 8 year old mother whose health is fading. Her hiusband is recovering well from his open heart surgery performed back in April. No c/o polyuria or polydipsia. Denies CP/palp/SOB/HA/congestion/fevers/GI or GU c/o. Taking meds as prescribed. Yesterday she had an episode with her bowels, she had an explosive episode of diarrhea. Happens about once a week. No bloody or tarry stool. No fevers or chills  Past Medical History:  Diagnosis Date  . Allergic state 03/12/2015  . Arthritis   . Colon polyp 11/19/2014  . Dermatitis 03/12/2015  . Diarrhea 06/29/2016  . DM (diabetes mellitus), type 2 ([facility]) 06/29/2016  . GERD (gastroesophageal reflux disease)    occ  . Gout 03/12/2015  . H/O measles   . H/O mumps   . Headache(784.0)    migraines  . History of chicken pox 11/23/2014  . Hyperglycemia 06/29/2016  . Lung cancer ([facility])   . Migraine 11/23/2014  . Obesity 11/23/2014  . Pneumonia    child  . Preventative health care 09/12/2015  . Renal cell cancer ([facility])    renal cell ca dx 9/01 and 8/08;  . Shortness of breath    occ  . Skin lesion of breast 03/12/2015    Past Surgical History:  Procedure Laterality Date  . ABDOMINAL HYSTERECTOMY    . APPENDECTOMY    . CARDIAC CATHETERIZATION     yrs ago neg  . CHOLECYSTECTOMY    . IR RADIOLOGIST EVAL & MGMT  07/17/2018  . IR RADIOLOGIST EVAL & MGMT  09/12/2018  . IR RADIOLOGIST EVAL & MGMT  06/18/2019  . LUNG CANCER SURGERY Left 08  . RADIOLOGY WITH ANESTHESIA N/A 08/21/2018   Procedure: CT WITH ANESTHESIA RENAL CRYOABLATION;  Surgeon: Arne Cleveland, MD;  Location: [facility];  Service: Radiology;  Laterality: N/A;  . RENAL MASS EXCISION  Left 01  . RIGHT/LEFT HEART CATH AND CORONARY ANGIOGRAPHY N/A 07/27/2016   Procedure: Right/Left Heart Cath and Coronary Angiography;  Surgeon: Leonie Man, MD;  Location: [facility];  Service: Cardiovascular;  Laterality: N/A;  . THORACOTOMY Left 05/09/2012   Procedure: THORACOTOMY MAJOR;  Surgeon: Gaye Pollack, MD;  Location: [facility];  Service: Thoracic;  Laterality: Left;  Marland Kitchen VIDEO BRONCHOSCOPY N/A 05/09/2012   Procedure: VIDEO BRONCHOSCOPY;  Surgeon: Gaye Pollack, MD;  Location: [facility] OR;  Service: Thoracic;  Laterality: N/A;  . WEDGE RESECTION Left 05/09/2012   Procedure: LEFT UPPER LOBE WEDGE RESECTION;  Surgeon: Gaye Pollack, MD;  Location: [facility];  Service: Thoracic;  Laterality: Left;    Family History  Problem Relation Age of Onset  . Heart failure Father   . Diabetes Father   . Hyperlipidemia Father   . Heart disease Father   . Hypertension Father   . Asthma Brother   . Asthma Daughter   . Cancer Paternal Grandmother   . Hearing loss Paternal Grandfather   . Stroke Paternal Grandfather   . Alcohol abuse Maternal Aunt     Social History   Socioeconomic History  . Marital status: Married    Spouse name: Not on file  . Number of children: Not on file  .  Years of education: Not on file  . Highest education level: Not on file  Occupational History  . Not on file  Tobacco Use  . Smoking status: Former Smoker    Packs/day: 0.50    Years: 35.00    Pack years: 17.50    Types: Cigarettes    Quit date: 05/08/2006    Years since quitting: 13.2  . Smokeless tobacco: Never Used  Substance and Sexual Activity  . Alcohol use: No  . Drug use: No  . Sexual activity: Not on file    Comment: lives with husband, no dietary restrictions.   Other Topics Concern  . Not on file  Social History Narrative  . Not on file   Social Determinants of Health   Financial Resource Strain:   . Difficulty of Paying Living Expenses:   Food Insecurity:   . Worried About Paediatric nurse in the Last Year:   . Arboriculturist in the Last Year:   Transportation Needs:   . Film/video editor (Medical):   Marland Kitchen Lack of Transportation (Non-Medical):   Physical Activity:   . Days of Exercise per Week:   . Minutes of Exercise per Session:   Stress:   . Feeling of Stress :   Social Connections:   . Frequency of Communication with Friends and Family:   . Frequency of Social Gatherings with Friends and Family:   . Attends Religious Services:   . Active Member of Clubs or Organizations:   . Attends Archivist Meetings:   Marland Kitchen Marital Status:   Intimate Partner Violence:   . Fear of Current or Ex-Partner:   . Emotionally Abused:   Marland Kitchen Physically Abused:   . Sexually Abused:     Outpatient Medications Prior to Visit  Medication Sig Dispense Refill  . allopurinol (ZYLOPRIM) 300 MG tablet TAKE 1 TABLET BY MOUTH EVERY DAY 90 tablet 1  . aspirin EC 81 MG tablet Take 81 mg by mouth daily.    Marland Kitchen atorvastatin (LIPITOR) 10 MG tablet Take 1 tablet (10 mg total) by mouth daily. 90 tablet 1  . Calcium Carb-Cholecalciferol (CALCIUM 600 + D PO) Take 1 tablet by mouth daily.    . carvedilol (COREG) 6.25 MG tablet TAKE 1 TABLET (6.25 MG TOTAL) BY MOUTH 2 (TWO) TIMES DAILY. PT NEEDS AN OV FOR FUTURE REFILLS 30 tablet 5  . diphenhydramine-acetaminophen (TYLENOL PM) 25-500 MG TABS Take 1 tablet by mouth at bedtime as needed (sleep).     . furosemide (LASIX) 40 MG tablet TAKE 1 TABLET BY MOUTH EVERY DAY 90 tablet 1  . glimepiride (AMARYL) 2 MG tablet TAKE 1 TABLET BY MOUTH DAILY BEFORE BREAKFAST. 90 tablet 1  . hyoscyamine (LEVSIN SL) 0.125 MG SL tablet Place 1 tablet (0.125 mg total) under the tongue every 6 (six) hours as needed. 30 tablet 0  . losartan (COZAAR) 25 MG tablet TAKE 1 TABLET BY MOUTH EVERY DAY 90 tablet 1  . meclizine (ANTIVERT) 12.5 MG tablet Take 1 tablet (12.5 mg total) by mouth 3 (three) times daily as needed for dizziness. As needed for   Dizziness or nausea  30 tablet 1  . metFORMIN (GLUCOPHAGE) 500 MG tablet 1/2 TABLET TWICE A DAY 90 tablet 1  . nystatin cream (MYCOSTATIN) Apply 1 application topically 2 (two) times daily. 30 g 1  . Omega-3 Fatty Acids (FISH OIL) 1000 MG CAPS Take 1 capsule (1,000 mg total) by mouth 2 (two) times daily.  0  .  OneTouch Delica Lancets 07M MISC USE AS DIRECTED TO TEST DAILY 100 each 2  . ONETOUCH VERIO test strip CEHCK BLOOD SUGAR DAILY AS NEEDED 100 strip 2  . Vitamin D, Ergocalciferol, (DRISDOL) 1.25 MG (50000 UNIT) CAPS capsule TAKE 1 CAPSULE (50,000 UNITS TOTAL) BY MOUTH EVERY 7 (SEVEN) DAYS. 12 capsule 1   No facility-administered medications prior to visit.    No Known Allergies  Review of Systems  Constitutional: Negative for fever and malaise/fatigue.  HENT: Negative for congestion.   Eyes: Negative for blurred vision.  Respiratory: Negative for shortness of breath.   Cardiovascular: Negative for chest pain, palpitations and leg swelling.  Gastrointestinal: Negative for abdominal pain, blood in stool and nausea.  Genitourinary: Negative for dysuria and frequency.  Musculoskeletal: Negative for falls.  Skin: Negative for rash.  Neurological: Negative for dizziness, loss of consciousness and headaches.  Endo/Heme/Allergies: Negative for environmental allergies.  Psychiatric/Behavioral: Negative for depression. The patient is not nervous/anxious.        Objective:    Physical Exam Constitutional:      General: She is not in acute distress.    Appearance: She is not diaphoretic.  HENT:     Head: Normocephalic and atraumatic.     Right Ear: External ear normal.     Left Ear: External ear normal.     Nose: Nose normal.     Mouth/Throat:     Pharynx: No oropharyngeal exudate.  Eyes:     General: No scleral icterus.       Right eye: No discharge.        Left eye: No discharge.     Conjunctiva/sclera: Conjunctivae normal.     Pupils: Pupils are equal, round, and reactive to light.  Neck:      Thyroid: No thyromegaly.  Cardiovascular:     Rate and Rhythm: Regular rhythm. Bradycardia present.     Heart sounds: Normal heart sounds. No murmur heard.   Pulmonary:     Effort: Pulmonary effort is normal. No respiratory distress.     Breath sounds: Normal breath sounds. No wheezing or rales.  Abdominal:     General: Bowel sounds are normal. There is no distension.     Palpations: Abdomen is soft. There is no mass.     Tenderness: There is no abdominal tenderness.  Musculoskeletal:        General: No tenderness. Normal range of motion.     Cervical back: Normal range of motion and neck supple.  Lymphadenopathy:     Cervical: No cervical adenopathy.  Skin:    General: Skin is warm and dry.     Findings: No rash.  Neurological:     Mental Status: She is alert and oriented to person, place, and time.     Cranial Nerves: No cranial nerve deficit.     Coordination: Coordination normal.     Deep Tendon Reflexes: Reflexes are normal and symmetric. Reflexes normal.     BP (!) 113/44 (BP Location: Left Arm, Cuff Size: Large)   Pulse 79   Temp 97.9 F (36.6 C) (Temporal)   Resp 16   Ht _0  (1.626 m)   Wt 217 lb 6.4 oz (98.6 kg)   SpO2 96%   BMI 37.32 kg/m  Wt Readings from Last 3 Encounters:  08/05/19 217 lb 6.4 oz (98.6 kg)  07/07/19 217 lb 3.2 oz (98.5 kg)  06/10/19 213 lb 14.4 oz (97 kg)    Diabetic Foot Exam - Simple  No data filed     Lab Results  Component Value Date   WBC 7.2 06/05/2019   HGB 12.2 06/05/2019   HCT 38.4 06/05/2019   PLT 175 06/05/2019   GLUCOSE 127 (H) 06/05/2019   CHOL 187 04/28/2019   TRIG 344.0 (H) 04/28/2019   HDL 46.80 04/28/2019   LDLDIRECT 74.0 04/28/2019   LDLCALC (H) 01/31/2007    117        Total Cholesterol/HDL:CHD Risk Coronary Heart Disease Risk Table                     Men   Women  1/2 Average Risk   3.4   3.3  Average Risk       5.0   4.4  2 X Average Risk   9.6   7.1  3 X Average Risk  23.4   11.0        Use the  calculated Patient Ratio above and the CHD Risk Table to determine the patient's CHD Risk.        ATP III CLASSIFICATION (LDL):  <100     mg/dL   Optimal  100-129  mg/dL   Near or Above                    Optimal  130-159  mg/dL   Borderline  160-189  mg/dL   High  >190     mg/dL   Very High   ALT 34 06/05/2019   AST 43 (H) 06/05/2019   NA 140 06/05/2019   K 4.8 06/05/2019   CL 109 06/05/2019   CREATININE 1.35 (H) 06/05/2019   BUN 28 (H) 06/05/2019   CO2 23 06/05/2019   TSH 2.56 04/28/2019   INR 1.1 08/19/2018   HGBA1C 6.9 (H) 04/28/2019    Lab Results  Component Value Date   TSH 2.56 04/28/2019   Lab Results  Component Value Date   WBC 7.2 06/05/2019   HGB 12.2 06/05/2019   HCT 38.4 06/05/2019   MCV 101.9 (H) 06/05/2019   PLT 175 06/05/2019   Lab Results  Component Value Date   NA 140 06/05/2019   K 4.8 06/05/2019   CHLORIDE 104 11/01/2016   CO2 23 06/05/2019   GLUCOSE 127 (H) 06/05/2019   BUN 28 (H) 06/05/2019   CREATININE 1.35 (H) 06/05/2019   BILITOT 0.6 06/05/2019   ALKPHOS 65 06/05/2019   AST 43 (H) 06/05/2019   ALT 34 06/05/2019   PROT 7.6 06/05/2019   ALBUMIN 3.9 06/05/2019   CALCIUM 9.9 06/05/2019   ANIONGAP 8 06/05/2019   EGFR 50 (L) 11/01/2016   GFR 41.29 (L) 04/28/2019   Lab Results  Component Value Date   CHOL 187 04/28/2019   Lab Results  Component Value Date   HDL 46.80 04/28/2019   Lab Results  Component Value Date   LDLCALC (H) 01/31/2007    117        Total Cholesterol/HDL:CHD Risk Coronary Heart Disease Risk Table                     Men   Women  1/2 Average Risk   3.4   3.3  Average Risk       5.0   4.4  2 X Average Risk   9.6   7.1  3 X Average Risk  23.4   11.0        Use the calculated Patient Ratio above and the CHD  Risk Table to determine the patient's CHD Risk.        ATP III CLASSIFICATION (LDL):  <100     mg/dL   Optimal  100-129  mg/dL   Near or Above                    Optimal  130-159  mg/dL    Borderline  160-189  mg/dL   High  >190     mg/dL   Very High   Lab Results  Component Value Date   TRIG 344.0 (H) 04/28/2019   Lab Results  Component Value Date   CHOLHDL 4 04/28/2019   Lab Results  Component Value Date   HGBA1C 6.9 (H) 04/28/2019       Assessment & Plan:   Problem List Items Addressed This Visit    Hyperlipidemia    Tolerating statin, encouraged heart healthy diet, avoid trans fats, minimize simple carbs and saturated fats. Increase exercise as tolerated      Low serum vitamin D    Supplement and monitor      Gout    No recent flares, continue to monitor uric acid      DM (diabetes mellitus), type 2 ([facility])    hgba1c acceptable, minimize simple carbs. Increase exercise as tolerated. Continue current meds      Chronic systolic heart failure ([place])   Renal insufficiency    Hydrate and monitor         I am having Ileana Ladd. Leatham maintain her diphenhydramine-acetaminophen, meclizine, aspirin EC, Fish Oil, nystatin cream, Calcium Carb-Cholecalciferol (CALCIUM 600 + D PO), OneTouch Delica Lancets 51G, carvedilol, Vitamin D (Ergocalciferol), furosemide, metFORMIN, allopurinol, hyoscyamine, losartan, glimepiride, atorvastatin, and OneTouch Verio.  No orders of the defined types were placed in this encounter.    Penni Homans, MD

## 2019-08-05 NOTE — Assessment & Plan Note (Signed)
hgba1c acceptable, minimize simple carbs. Increase exercise as tolerated. Continue current meds 

## 2019-08-05 NOTE — Assessment & Plan Note (Signed)
No recent flares, continue to monitor uric acid

## 2019-08-05 NOTE — Assessment & Plan Note (Signed)
Hydrate and monitor 

## 2019-08-05 NOTE — Assessment & Plan Note (Signed)
Tolerating statin, encouraged heart healthy diet, avoid trans fats, minimize simple carbs and saturated fats. Increase exercise as tolerated 

## 2019-08-05 NOTE — Assessment & Plan Note (Addendum)
Has an episode a week, the hyoscyamine helps the pain but she still has explosive episodes no blood or tarry stool. She has an appt with Gastroenterology on August 12. She is to add Benefiber bid and see if it helps

## 2019-08-05 NOTE — Assessment & Plan Note (Signed)
Supplement and monitor 

## 2019-08-06 ENCOUNTER — Telehealth: Payer: Self-pay | Admitting: *Deleted

## 2019-08-06 ENCOUNTER — Other Ambulatory Visit: Payer: Medicare HMO

## 2019-08-06 MED ORDER — ATORVASTATIN CALCIUM 20 MG PO TABS
20.0000 mg | ORAL_TABLET | Freq: Every day | ORAL | 1 refills | Status: DC
Start: 2019-08-06 — End: 2020-08-20

## 2019-08-06 NOTE — Telephone Encounter (Signed)
PTH and calcium. The D must not have been back yet

## 2019-08-06 NOTE — Telephone Encounter (Signed)
For the orders to recheck next week you wanted PTH and Vitamin D.  Did you mean PTH with calcium?  Vitamin D was order with these results and was normal.

## 2019-08-06 NOTE — Telephone Encounter (Signed)
-----   Message from [name], MD sent at 08/05/2019  8:55 PM EDT ----- Notify cholesterol still up some increase Atorvastatin to 20 mg po qhs disp #30 with 5 rf or #90 with 1 per patient preference. Also potassium up slightly, minimize in diet, hydrate well and then recheck in 1 week. Also calcium up also minimize in diet and check the cmp in 1 week with PTH and Vitamin D added for hypercalcemia. Creatinine up some so increase hydration and we will check levels in blood draw next week

## 2019-08-07 ENCOUNTER — Other Ambulatory Visit: Payer: Self-pay | Admitting: *Deleted

## 2019-08-07 DIAGNOSIS — E875 Hyperkalemia: Secondary | ICD-10-CM

## 2019-08-07 NOTE — Telephone Encounter (Signed)
Future orders placed 

## 2019-08-20 ENCOUNTER — Other Ambulatory Visit (INDEPENDENT_AMBULATORY_CARE_PROVIDER_SITE_OTHER): Payer: Medicare HMO

## 2019-08-20 ENCOUNTER — Other Ambulatory Visit: Payer: Self-pay

## 2019-08-20 DIAGNOSIS — E875 Hyperkalemia: Secondary | ICD-10-CM | POA: Diagnosis not present

## 2019-08-20 LAB — COMPREHENSIVE METABOLIC PANEL
ALT: 41 U/L — ABNORMAL HIGH (ref 0–35)
AST: 47 U/L — ABNORMAL HIGH (ref 0–37)
Albumin: 4 g/dL (ref 3.5–5.2)
Alkaline Phosphatase: 64 U/L (ref 39–117)
BUN: 27 mg/dL — ABNORMAL HIGH (ref 6–23)
CO2: 25 mEq/L (ref 19–32)
Calcium: 10.3 mg/dL (ref 8.4–10.5)
Chloride: 102 mEq/L (ref 96–112)
Creatinine, Ser: 1.27 mg/dL — ABNORMAL HIGH (ref 0.40–1.20)
GFR: 40.88 mL/min — ABNORMAL LOW (ref >60.00)
Glucose, Bld: 195 mg/dL — ABNORMAL HIGH (ref 70–99)
Potassium: 4.6 mEq/L (ref 3.5–5.1)
Sodium: 136 mEq/L (ref 135–145)
Total Bilirubin: 0.7 mg/dL (ref 0.2–1.2)
Total Protein: 7.1 g/dL (ref 6.0–8.3)

## 2019-08-22 LAB — PTH, INTACT AND CALCIUM: PTH: 77 pg/mL — ABNORMAL HIGH (ref 14–64)

## 2019-09-02 ENCOUNTER — Other Ambulatory Visit: Payer: Self-pay | Admitting: Oncology

## 2019-09-02 ENCOUNTER — Ambulatory Visit
Admission: RE | Admit: 2019-09-02 | Discharge: 2019-09-02 | Disposition: A | Discharge status: Home / Self Care | Payer: Medicare HMO | Source: Ambulatory Visit | Attending: Oncology | Admitting: Oncology

## 2019-09-02 ENCOUNTER — Other Ambulatory Visit: Payer: Self-pay | Admitting: Family Medicine

## 2019-09-02 ENCOUNTER — Ambulatory Visit: Payer: Medicare HMO | Admitting: Gastroenterology

## 2019-09-02 ENCOUNTER — Other Ambulatory Visit: Payer: Self-pay

## 2019-09-02 DIAGNOSIS — R921 Mammographic calcification found on diagnostic imaging of breast: Secondary | ICD-10-CM

## 2019-09-02 NOTE — Telephone Encounter (Signed)
Do you want Pt to continue Ergocalciferol?

## 2019-09-11 ENCOUNTER — Ambulatory Visit (INDEPENDENT_AMBULATORY_CARE_PROVIDER_SITE_OTHER): Payer: Medicare HMO | Admitting: Gastroenterology

## 2019-09-11 ENCOUNTER — Other Ambulatory Visit: Payer: Self-pay

## 2019-09-11 VITALS — BP 116/60 | HR 78 | Ht 64.0 in | Wt 218.4 lb

## 2019-09-11 DIAGNOSIS — R7401 Elevation of levels of liver transaminase levels: Secondary | ICD-10-CM | POA: Diagnosis not present

## 2019-09-11 DIAGNOSIS — R1032 Left lower quadrant pain: Secondary | ICD-10-CM

## 2019-09-11 DIAGNOSIS — R932 Abnormal findings on diagnostic imaging of liver and biliary tract: Secondary | ICD-10-CM | POA: Diagnosis not present

## 2019-09-11 DIAGNOSIS — R197 Diarrhea, unspecified: Secondary | ICD-10-CM

## 2019-09-11 NOTE — Patient Instructions (Addendum)
If you are age 76 or older, your body mass index should be between 23-30. Your Body mass index is 37.48 kg/m. If this is out of the aforementioned range listed, please consider follow up with your Primary Care Provider.  If you are age 39 or younger, your body mass index should be between 19-25. Your Body mass index is 37.48 kg/m. If this is out of the aformentioned range listed, please consider follow up with your Primary Care Provider.    Your provider has requested that you go to the basement level for lab work at [facility]. [place], [place]. Press "B" on the elevator. The lab is located at the first door on the left as you exit the elevator. HAVE LABS DONE ON 09/18/19.  You have been scheduled for an abdominal ultrasound at [facility] (1st floor of hospital) on 09/18/19 at 10 am. Please arrive 15 minutes prior to your appointment for registration. Make certain not to have anything to eat or drink after midnight the day prior to your appointment. Should you need to reschedule your appointment, please contact radiology at [phone]. This test typically takes about 30 minutes to perform.  Start Benefiber twice daily.  This is over-the-counter.  Follow up in three months.  Please call the office for an appointment as the schedule is not available at this time.   It was a pleasure to see you today!  Vito Cirigliano, D.O.

## 2019-09-11 NOTE — Progress Notes (Signed)
Chief Complaint: Diarrhea, abdominal pain  Referring Provider:    [name], MD  HPI:     Amanda Crawford is a 74 y.o. female with a history of renal cell cancer, lung cancer, DM, GERD, obesity (BMI 37.5), hyperlipidemia, gout, systolic heart failure, previous hysterectomy, appendectomy, cholecystectomy, lung cancer surgery (left thoracotomy with wedge resection), left nephrectomy 2001, cryoablation of right renal mass 07/2018, referred to the [facility] for evaluation of episodic diarrhea and abdominal pain.  No associated hematochezia or melena.  She states she has an episode of explosive diarrhea approximately once/week.   Also with intermittent LLQ pain tends to be associated with diarrhea.  Pain occurs at random- no provoking/exacerbating factors. Can go 1 week without sxs, then daily sxs x1 week. Sxs present on/off for several years. Has had episodes of urge incontinence. No fecal seepage.  Previously prescribed hyoscyamine prn which helps with pain episodes (but she stopped taking). Recently recommended trial of Benefiber twice daily and Imodium as needed by [facility], but she didn't take these. Has had episodes of tenesmus with her LLQ pain sxs as well.   Prior baseline was 1-2 formed, soft stools/day.   -08/20/2019: AST/ALT 47/41 (stable from previous), otherwise normal liver enzymes.  BUN/creatinine 27/1.27 (stable/chronic), otherwise normal BMP -08/05/2019: Normal CBC, TSH, vitamin D -06/05/2019: CT abdomen/pelvis: Cirrhotic appearing liver, 6 mm right hepatic lobe lesion similar to previous exam, no duct dilatation, fatty replacement of the pancreas.:  Diverticulosis otherwise normal GI tract.  Moderate pelvic floor laxity -11/20/2018: CT abdomen/pelvis: Nodular hepatic contours, mild hepatic steatosis.  Normal pancreas.  Reactive lymph nodes near the porta hepatis.  Previously followed by [facility], with colonoscopy 09/2016. Was hospitalized in  [place] in 12/2018 with left sided abdominal pain with essentially unrevealing w/u per patient (no inpatient records for review today). Was seen by GI and trialed on unknown medication for a brief period- unsure if it was efficacious.   Endoscopic History: -Colonoscopy (09/2016, [facility]): 1 polyp in transverse colon, mild sigmoid diverticulosis.  Repeat in 5 years -Colonoscopy (08/2013, [facility]): 5 polyps (tubular adenomas, SSP's, hyperplastic polyp), sigmoid diverticulosis, cecal AVM.  Repeat 3 years  Past Medical History:  Diagnosis Date  . Allergic state 03/12/2015  . Arthritis   . Colon polyp 11/19/2014  . Dermatitis 03/12/2015  . Diarrhea 06/29/2016  . Diverticulitis   . DM (diabetes mellitus), type 2 ([place]) 06/29/2016  . GERD (gastroesophageal reflux disease)    occ  . Gout 03/12/2015  . H/O measles   . H/O mumps   . Headache(784.0)    migraines  . History of chicken pox 11/23/2014  . Hyperglycemia 06/29/2016  . Lung cancer ([place])   . Migraine 11/23/2014  . Obesity 11/23/2014  . Pneumonia    child  . Preventative health care 09/12/2015  . Renal cell cancer ([place])    renal cell ca dx 9/01 and 8/08;  . Shortness of breath    occ  . Skin lesion of breast 03/12/2015     Past Surgical History:  Procedure Laterality Date  . ABDOMINAL HYSTERECTOMY    . APPENDECTOMY    . CARDIAC CATHETERIZATION     yrs ago neg  . CHOLECYSTECTOMY    . COLONOSCOPY  2018  . ESOPHAGOGASTRODUODENOSCOPY     years ago  . IR RADIOLOGIST EVAL & MGMT  07/17/2018  . IR RADIOLOGIST EVAL & MGMT  09/12/2018  . IR RADIOLOGIST  EVAL & MGMT  06/18/2019  . KIDNEY SURGERY  2001   Removed of left kidney   . LUNG CANCER SURGERY Left 08  . RADIOLOGY WITH ANESTHESIA N/A 08/21/2018   Procedure: CT WITH ANESTHESIA RENAL CRYOABLATION;  Surgeon: Arne Cleveland, MD;  Location: [facility];  Service: Radiology;  Laterality: N/A;  . RENAL MASS EXCISION Left 01  . RIGHT/LEFT HEART CATH AND CORONARY  ANGIOGRAPHY N/A 07/27/2016   Procedure: Right/Left Heart Cath and Coronary Angiography;  Surgeon: Leonie Man, MD;  Location: [place] [facility];  Service: Cardiovascular;  Laterality: N/A;  . THORACOTOMY Left 05/09/2012   Procedure: THORACOTOMY MAJOR;  Surgeon: Gaye Pollack, MD;  Location: [place];  Service: Thoracic;  Laterality: Left;  Marland Kitchen VIDEO BRONCHOSCOPY N/A 05/09/2012   Procedure: VIDEO BRONCHOSCOPY;  Surgeon: Gaye Pollack, MD;  Location: [facility] OR;  Service: Thoracic;  Laterality: N/A;  . WEDGE RESECTION Left 05/09/2012   Procedure: LEFT UPPER LOBE WEDGE RESECTION;  Surgeon: Gaye Pollack, MD;  Location: [facility] OR;  Service: Thoracic;  Laterality: Left;   Family History  Problem Relation Age of Onset  . Heart failure Father   . Diabetes Father   . Hyperlipidemia Father   . Heart disease Father   . Hypertension Father   . Asthma Brother   . Asthma Daughter   . Cancer Paternal Grandmother   . Hearing loss Paternal Grandfather   . Stroke Paternal Grandfather   . Alcohol abuse Maternal Aunt   . Colon cancer Paternal Aunt    Social History   Tobacco Use  . Smoking status: Former Smoker    Packs/day: 0.50    Years: 35.00    Pack years: 17.50    Types: Cigarettes    Quit date: 05/08/2006    Years since quitting: 13.3  . Smokeless tobacco: Never Used  Vaping Use  . Vaping Use: Never used  Substance Use Topics  . Alcohol use: Yes    Comment: seldomly  . Drug use: No   Current Outpatient Medications  Medication Sig Dispense Refill  . allopurinol (ZYLOPRIM) 300 MG tablet TAKE 1 TABLET BY MOUTH EVERY DAY 90 tablet 1  . aspirin EC 81 MG tablet Take 81 mg by mouth daily.    Marland Kitchen atorvastatin (LIPITOR) 20 MG tablet Take 1 tablet (20 mg total) by mouth daily. 90 tablet 1  . Calcium Carb-Cholecalciferol (CALCIUM 600 + D PO) Take 1 tablet by mouth daily.    . carvedilol (COREG) 6.25 MG tablet TAKE 1 TABLET (6.25 MG TOTAL) BY MOUTH 2 (TWO) TIMES DAILY. PT NEEDS AN OV FOR FUTURE REFILLS 30  tablet 5  . diphenhydramine-acetaminophen (TYLENOL PM) 25-500 MG TABS Take 1 tablet by mouth at bedtime as needed (sleep).     . furosemide (LASIX) 40 MG tablet TAKE 1 TABLET BY MOUTH EVERY DAY 90 tablet 1  . glimepiride (AMARYL) 2 MG tablet TAKE 1 TABLET BY MOUTH DAILY BEFORE BREAKFAST. 90 tablet 1  . hyoscyamine (LEVSIN SL) 0.125 MG SL tablet Place 1 tablet (0.125 mg total) under the tongue every 6 (six) hours as needed. 30 tablet 0  . losartan (COZAAR) 25 MG tablet TAKE 1 TABLET BY MOUTH EVERY DAY 90 tablet 1  . meclizine (ANTIVERT) 12.5 MG tablet Take 1 tablet (12.5 mg total) by mouth 3 (three) times daily as needed for dizziness. As needed for   Dizziness or nausea 30 tablet 1  . metFORMIN (GLUCOPHAGE) 500 MG tablet 1/2 TABLET TWICE A DAY 90  tablet 1  . nystatin cream (MYCOSTATIN) Apply 1 application topically 2 (two) times daily. 30 g 1  . OneTouch Delica Lancets 07M MISC USE AS DIRECTED TO TEST DAILY 100 each 2  . ONETOUCH VERIO test strip CEHCK BLOOD SUGAR DAILY AS NEEDED 100 strip 2  . Vitamin D, Ergocalciferol, (DRISDOL) 1.25 MG (50000 UNIT) CAPS capsule TAKE 1 CAPSULE (50,000 UNITS TOTAL) BY MOUTH EVERY 7 (SEVEN) DAYS. 12 capsule 1   No current facility-administered medications for this visit.   No Known Allergies   Review of Systems: All systems reviewed and negative except where noted in HPI.     Physical Exam:    Wt Readings from Last 3 Encounters:  09/11/19 218 lb 6 oz (99.1 kg)  08/05/19 217 lb 6.4 oz (98.6 kg)  07/07/19 217 lb 3.2 oz (98.5 kg)    BP 116/60   Pulse 78   Ht 5\' 4"  (1.626 m)   Wt 218 lb 6 oz (99.1 kg)   BMI 37.48 kg/m  Constitutional:  Pleasant, in no acute distress. Psychiatric: Normal mood and affect. Behavior is normal. EENT: Pupils normal.  Conjunctivae are normal. No scleral icterus. Neck supple. No cervical LAD. Cardiovascular: Normal rate, regular rhythm. No edema Pulmonary/chest: Effort normal and breath sounds normal. No wheezing, rales  or rhonchi. Abdominal: Minimal TTP in LLQ without rebound or guarding.  No peritoneal signs.  Soft, nondistended. Bowel sounds active throughout. There are no masses palpable. No hepatomegaly. Neurological: Alert and oriented to person place and time. Skin: Skin is warm and dry. No rashes noted.   ASSESSMENT AND PLAN;   1) Diarrhea 2) LLQ pain 3) Urge fecal incontinence Several year history of episodic LLQ pain followed by explosive watery stools and occasional episodes of urge incontinence. -Check pancreatic fecal elastase, GI PCR panel, fecal calprotectin - Benefiber BID - Levsin prn ok to resume - If not improving, plan for colonoscopy to evaluate for mucosal/luminal pathology along with biopsies to evaluate for microscopic colitis -Antidiarrheal agents okay to use having recurrent loose stools on any given day  4) Elevated AST/ALT 5) Abnormal liver imaging -Nodular appearing liver CT scan, but otherwise without serologic or clinical markers of impaired hepatic synthetic function (normal albumin, PLT, T bili, INR) - RUQ [place] to assess -Discussed the lab and imaging findings with patient at length today - APRI score 0.72 which indicates significant fibrosis or possibly cirrhosis -Suspected to be some element NAFLD or perhaps NASH.  Pending RUQ [place], could also consider elastography vs further non-invasive serologic studies (ie, FibroSure, etc).    - RTC in 3 months or sooner prn  I spent 45 minutes of time, including in depth chart review, independent review of results as outlined above, communicating results with the patient directly, face-to-face time with the patient, coordinating care, ordering studies and medications as appropriate, and documentation.     Lavena Bullion, DO, FACG  09/11/2019, 9:45 AM   [name], MD

## 2019-09-18 ENCOUNTER — Other Ambulatory Visit: Payer: Medicare HMO

## 2019-09-18 ENCOUNTER — Ambulatory Visit (HOSPITAL_COMMUNITY)
Admission: RE | Admit: 2019-09-18 | Discharge: 2019-09-18 | Disposition: A | Discharge status: Home / Self Care | Payer: Medicare HMO | Source: Ambulatory Visit | Attending: Gastroenterology | Admitting: Gastroenterology

## 2019-09-18 ENCOUNTER — Other Ambulatory Visit: Payer: Self-pay

## 2019-09-18 DIAGNOSIS — K7460 Unspecified cirrhosis of liver: Secondary | ICD-10-CM | POA: Diagnosis not present

## 2019-09-18 DIAGNOSIS — R7401 Elevation of levels of liver transaminase levels: Secondary | ICD-10-CM | POA: Insufficient documentation

## 2019-09-18 DIAGNOSIS — R197 Diarrhea, unspecified: Secondary | ICD-10-CM | POA: Insufficient documentation

## 2019-09-18 DIAGNOSIS — R1032 Left lower quadrant pain: Secondary | ICD-10-CM | POA: Diagnosis not present

## 2019-09-19 ENCOUNTER — Telehealth: Payer: Self-pay | Admitting: Gastroenterology

## 2019-09-19 DIAGNOSIS — R1032 Left lower quadrant pain: Secondary | ICD-10-CM | POA: Diagnosis not present

## 2019-09-19 DIAGNOSIS — R7401 Elevation of levels of liver transaminase levels: Secondary | ICD-10-CM | POA: Diagnosis not present

## 2019-09-19 DIAGNOSIS — R197 Diarrhea, unspecified: Secondary | ICD-10-CM | POA: Diagnosis not present

## 2019-09-19 NOTE — Telephone Encounter (Signed)
I sent a result message earlier recommending MR elastography based on the abnormal ultrasound. However, I didn't realize we have [place] elastography available, and plan to change to that instead. Also, I reviewed her previous labs, and recommend the following:  - Ultrasound Elastography as above - Send FibroSure panel - Check INR, ANA, ASMA, Immunoglobulin panel (IgG, IgA, IgM), A1-AT,  Chronic viral hepatitis panel, ferritin, iron panel, LKM Ab, tTG IgA

## 2019-09-22 ENCOUNTER — Other Ambulatory Visit: Payer: Self-pay

## 2019-09-22 DIAGNOSIS — R932 Abnormal findings on diagnostic imaging of liver and biliary tract: Secondary | ICD-10-CM

## 2019-09-22 DIAGNOSIS — R7989 Other specified abnormal findings of blood chemistry: Secondary | ICD-10-CM

## 2019-09-22 NOTE — Progress Notes (Signed)
FIBROSURE

## 2019-09-24 LAB — GI PROFILE, STOOL, PCR

## 2019-09-24 LAB — PANCREATIC ELASTASE, FECAL: Pancreatic Elastase-1, Stool: >500 mcg/g

## 2019-09-24 LAB — CALPROTECTIN, FECAL: Calprotectin, Fecal: <16 ug/g (ref 0–120)

## 2019-09-25 ENCOUNTER — Other Ambulatory Visit: Payer: Self-pay | Admitting: Family Medicine

## 2019-09-25 ENCOUNTER — Other Ambulatory Visit: Payer: Self-pay

## 2019-09-25 MED ORDER — FIDAXOMICIN 200 MG PO TABS: 200.0000 mg | ORAL_TABLET | Freq: Two times a day (BID) | ORAL | 0 refills | Status: DC

## 2019-09-26 ENCOUNTER — Telehealth: Payer: Self-pay | Admitting: Gastroenterology

## 2019-09-29 ENCOUNTER — Other Ambulatory Visit: Payer: Self-pay | Admitting: Gastroenterology

## 2019-09-29 ENCOUNTER — Other Ambulatory Visit: Payer: Self-pay

## 2019-09-29 MED ORDER — VANCOMYCIN HCL 125 MG PO CAPS: 125.0000 mg | ORAL_CAPSULE | Freq: Four times a day (QID) | ORAL | 0 refills | Status: AC

## 2019-09-29 NOTE — Telephone Encounter (Signed)
Left message on patients voicemail explaining her diagnosis.  Also a new prescription for Vancocin sent to her pharmacy.  Pt is to call back if she still has questions.

## 2019-09-30 ENCOUNTER — Ambulatory Visit (HOSPITAL_COMMUNITY)
Admission: RE | Admit: 2019-09-30 | Discharge: 2019-09-30 | Disposition: A | Discharge status: Home / Self Care | Payer: Medicare HMO | Source: Ambulatory Visit | Attending: Gastroenterology | Admitting: Gastroenterology

## 2019-09-30 ENCOUNTER — Other Ambulatory Visit (INDEPENDENT_AMBULATORY_CARE_PROVIDER_SITE_OTHER): Payer: Medicare HMO

## 2019-09-30 DIAGNOSIS — R932 Abnormal findings on diagnostic imaging of liver and biliary tract: Secondary | ICD-10-CM

## 2019-09-30 DIAGNOSIS — K7689 Other specified diseases of liver: Secondary | ICD-10-CM | POA: Diagnosis not present

## 2019-09-30 DIAGNOSIS — R197 Diarrhea, unspecified: Secondary | ICD-10-CM | POA: Diagnosis not present

## 2019-09-30 DIAGNOSIS — R748 Abnormal levels of other serum enzymes: Secondary | ICD-10-CM | POA: Diagnosis not present

## 2019-09-30 LAB — IBC + FERRITIN
Ferritin: 36.8 ng/mL (ref 10.0–291.0)
Iron: 95 ug/dL (ref 42–145)
Saturation Ratios: 18.9 % — ABNORMAL LOW (ref 20.0–50.0)
Transferrin: 359 mg/dL (ref 212.0–360.0)

## 2019-09-30 LAB — PROTIME-INR
INR: 1.1 ratio — ABNORMAL HIGH (ref 0.8–1.0)
Prothrombin Time: 12.1 s (ref 9.6–13.1)

## 2019-10-02 NOTE — Telephone Encounter (Signed)
Spoke with patient this morning regarding her medication sent to the pharmacy three days ago.  She states she will pick that up today. She is awaiting results from Elastography and labs.  I told her we will be calling her back regarding those results.

## 2019-10-04 LAB — IGG, IGA, IGM
IgG (Immunoglobin G), Serum: 1347 mg/dL (ref 600–1540)
IgM, Serum: 89 mg/dL (ref 50–300)
Immunoglobulin A: 231 mg/dL (ref 70–320)

## 2019-10-04 LAB — ANTI-MICROSOMAL ANTIBODY LIVER / KIDNEY: LKM1 Ab: <=20 U (ref <=20.0)

## 2019-10-04 LAB — HEPATITIS PANEL, ACUTE
Hep A IgM: NONREACTIVE
Hep B C IgM: NONREACTIVE
Hepatitis B Surface Ag: NONREACTIVE
Hepatitis C Ab: NONREACTIVE
SIGNAL TO CUT-OFF: 0.02 (ref <1.00)

## 2019-10-04 LAB — ANA: Anti Nuclear Antibody (ANA): NEGATIVE

## 2019-10-04 LAB — TISSUE TRANSGLUTAMINASE, IGA: (tTG) Ab, IgA: 1 U/mL

## 2019-10-04 LAB — ANTI-SMOOTH MUSCLE ANTIBODY, IGG: Actin (Smooth Muscle) Antibody (IGG): <20 U (ref <20)

## 2019-10-04 LAB — ALPHA-1-ANTITRYPSIN: A-1 Antitrypsin, Ser: 110 mg/dL (ref 83–199)

## 2019-10-08 LAB — NASH FIBROSURE
ALPHA 2-MACROGLOBULINS, QN: 365 mg/dL — ABNORMAL HIGH (ref 110–276)
ALT (SGPT) P5P: 52 IU/L — ABNORMAL HIGH (ref 0–40)
AST (SGOT) P5P: 56 IU/L — ABNORMAL HIGH (ref 0–40)
Apolipoprotein A-1: 189 mg/dL (ref 116–209)
Bilirubin, Total: 0.4 mg/dL (ref 0.0–1.2)
Cholesterol, Total: 193 mg/dL (ref 100–199)
Fibrosis Score: 0.59 — ABNORMAL HIGH (ref 0.00–0.21)
GGT: 124 IU/L — ABNORMAL HIGH (ref 0–60)
Glucose: 151 mg/dL — ABNORMAL HIGH (ref 65–99)
Haptoglobin: 120 mg/dL (ref 42–346)
Height: 64 in
NASH Score: 0.75 — ABNORMAL HIGH
Steatosis Score: 0.79 — ABNORMAL HIGH (ref 0.00–0.30)
Triglycerides: 267 mg/dL — ABNORMAL HIGH (ref 0–149)
Weight: 215 [lb_av]

## 2019-10-24 DIAGNOSIS — R69 Illness, unspecified: Secondary | ICD-10-CM | POA: Diagnosis not present

## 2019-11-05 DIAGNOSIS — R69 Illness, unspecified: Secondary | ICD-10-CM | POA: Diagnosis not present

## 2019-11-15 ENCOUNTER — Other Ambulatory Visit: Payer: Self-pay | Admitting: Internal Medicine

## 2019-11-26 ENCOUNTER — Other Ambulatory Visit: Payer: Self-pay | Admitting: Interventional Radiology

## 2019-11-26 DIAGNOSIS — N2889 Other specified disorders of kidney and ureter: Secondary | ICD-10-CM

## 2019-12-04 ENCOUNTER — Ambulatory Visit (HOSPITAL_COMMUNITY)
Admission: RE | Admit: 2019-12-04 | Discharge: 2019-12-04 | Disposition: A | Discharge status: Home / Self Care | Payer: Medicare HMO | Source: Ambulatory Visit | Attending: Oncology | Admitting: Oncology

## 2019-12-04 ENCOUNTER — Inpatient Hospital Stay: Payer: Medicare HMO | Attending: Oncology

## 2019-12-04 ENCOUNTER — Encounter (HOSPITAL_COMMUNITY): Payer: Self-pay

## 2019-12-04 ENCOUNTER — Other Ambulatory Visit: Payer: Self-pay

## 2019-12-04 DIAGNOSIS — M47816 Spondylosis without myelopathy or radiculopathy, lumbar region: Secondary | ICD-10-CM | POA: Diagnosis not present

## 2019-12-04 DIAGNOSIS — I2510 Atherosclerotic heart disease of native coronary artery without angina pectoris: Secondary | ICD-10-CM | POA: Insufficient documentation

## 2019-12-04 DIAGNOSIS — I700 Atherosclerosis of aorta: Secondary | ICD-10-CM | POA: Insufficient documentation

## 2019-12-04 DIAGNOSIS — C642 Malignant neoplasm of left kidney, except renal pelvis: Secondary | ICD-10-CM | POA: Insufficient documentation

## 2019-12-04 DIAGNOSIS — R0609 Other forms of dyspnea: Secondary | ICD-10-CM | POA: Insufficient documentation

## 2019-12-04 DIAGNOSIS — K5730 Diverticulosis of large intestine without perforation or abscess without bleeding: Secondary | ICD-10-CM | POA: Insufficient documentation

## 2019-12-04 DIAGNOSIS — Z79899 Other long term (current) drug therapy: Secondary | ICD-10-CM | POA: Insufficient documentation

## 2019-12-04 DIAGNOSIS — J984 Other disorders of lung: Secondary | ICD-10-CM | POA: Insufficient documentation

## 2019-12-04 DIAGNOSIS — Z85528 Personal history of other malignant neoplasm of kidney: Secondary | ICD-10-CM | POA: Diagnosis not present

## 2019-12-04 DIAGNOSIS — K7460 Unspecified cirrhosis of liver: Secondary | ICD-10-CM | POA: Insufficient documentation

## 2019-12-04 DIAGNOSIS — Z905 Acquired absence of kidney: Secondary | ICD-10-CM | POA: Diagnosis not present

## 2019-12-04 DIAGNOSIS — D4989 Neoplasm of unspecified behavior of other specified sites: Secondary | ICD-10-CM | POA: Diagnosis present

## 2019-12-04 HISTORY — DX: Disorder of kidney and ureter, unspecified: N28.9

## 2019-12-04 LAB — CBC WITH DIFFERENTIAL (CANCER CENTER ONLY)
Abs Immature Granulocytes: 0.03 10*3/uL (ref 0.00–0.07)
Basophils Absolute: 0.1 10*3/uL (ref 0.0–0.1)
Basophils Relative: 1 %
Eosinophils Absolute: 0.2 10*3/uL (ref 0.0–0.5)
Eosinophils Relative: 3 %
HCT: 39.1 % (ref 36.0–46.0)
Hemoglobin: 12.3 g/dL (ref 12.0–15.0)
Immature Granulocytes: 0 %
Lymphocytes Relative: 25 %
Lymphs Abs: 2.1 10*3/uL (ref 0.7–4.0)
MCH: 31.5 pg (ref 26.0–34.0)
MCHC: 31.5 g/dL (ref 30.0–36.0)
MCV: 100 fL (ref 80.0–100.0)
Monocytes Absolute: 0.6 10*3/uL (ref 0.1–1.0)
Monocytes Relative: 7 %
Neutro Abs: 5.2 10*3/uL (ref 1.7–7.7)
Neutrophils Relative %: 64 %
Platelet Count: 171 10*3/uL (ref 150–400)
RBC: 3.91 MIL/uL (ref 3.87–5.11)
RDW: 15.4 % (ref 11.5–15.5)
WBC Count: 8.1 10*3/uL (ref 4.0–10.5)
nRBC: 0 % (ref 0.0–0.2)

## 2019-12-04 LAB — CMP (CANCER CENTER ONLY)
ALT: 41 U/L (ref 0–44)
AST: 51 U/L — ABNORMAL HIGH (ref 15–41)
Albumin: 4.1 g/dL (ref 3.5–5.0)
Alkaline Phosphatase: 69 U/L (ref 38–126)
Anion gap: 8 (ref 5–15)
BUN: 26 mg/dL — ABNORMAL HIGH (ref 8–23)
CO2: 28 mmol/L (ref 22–32)
Calcium: 10.7 mg/dL — ABNORMAL HIGH (ref 8.9–10.3)
Chloride: 103 mmol/L (ref 98–111)
Creatinine: 1.45 mg/dL — ABNORMAL HIGH (ref 0.44–1.00)
GFR, Estimated: 37 mL/min — ABNORMAL LOW (ref >60)
Glucose, Bld: 147 mg/dL — ABNORMAL HIGH (ref 70–99)
Potassium: 4.6 mmol/L (ref 3.5–5.1)
Sodium: 139 mmol/L (ref 135–145)
Total Bilirubin: 1 mg/dL (ref 0.3–1.2)
Total Protein: 7.9 g/dL (ref 6.5–8.1)

## 2019-12-04 MED ORDER — IOHEXOL 300 MG/ML  SOLN
75.0000 mL | Freq: Once | INTRAMUSCULAR | Status: AC | PRN
  Administered 2019-12-04: 75 mL via INTRAVENOUS

## 2019-12-10 ENCOUNTER — Other Ambulatory Visit: Payer: Self-pay | Admitting: Family Medicine

## 2019-12-11 ENCOUNTER — Other Ambulatory Visit: Payer: Self-pay | Admitting: Internal Medicine

## 2019-12-11 ENCOUNTER — Other Ambulatory Visit: Payer: Self-pay

## 2019-12-11 ENCOUNTER — Inpatient Hospital Stay: Payer: Medicare HMO | Admitting: Oncology

## 2019-12-11 VITALS — BP 123/56 | HR 81 | Temp 97.8°F | Resp 18 | Ht 64.0 in | Wt 223.3 lb

## 2019-12-11 DIAGNOSIS — C642 Malignant neoplasm of left kidney, except renal pelvis: Secondary | ICD-10-CM | POA: Diagnosis not present

## 2019-12-11 DIAGNOSIS — D4989 Neoplasm of unspecified behavior of other specified sites: Secondary | ICD-10-CM

## 2019-12-11 NOTE — Progress Notes (Signed)
Hematology and Oncology Follow Up   Amanda Crawford [id number] Oct 11, 1945 74 y.o. 12/11/2019 9:43 AM [name], MDBlyth, [name], MD       Principle Diagnosis: 74 year old woman with left kidney cancer diagnosed in 2001.  She was found to have stage IV clear-cell with pulmonary involvement.  She has no active disease after surgical resection.   Secondary diagnosis: Right renal neoplasm.  She is status post cryoablation completed on August 21, 2018.  Prior Therapy:  She is status post left nephrectomy in 2001.  She developed a left lingular mass, status post VATS procedure.  Pathology revealed a recurrent renal cell carcinoma resected in October 2008.   She developed relapse disease in 2014 and subsequently underwent a VATS and left upper lobe wedge resection of a tumor done on 05/09/2012. Pathology confirmed another relapse. She is status post cryoablation of a right renal neoplasm on August 21, 2018.  Current therapy: Active surveillance.   Interim History: Amanda Crawford presents today for a repeat evaluation.  Since the last visit, she reports no major changes in her health.  She denies any nausea, vomiting or abdominal pain.  She denies any worsening respiratory status.  She does report some occasional dyspnea on exertion.  She did not take any cough or wheezing.    Medications: Unchanged on review. Current Outpatient Medications  Medication Sig Dispense Refill  . allopurinol (ZYLOPRIM) 300 MG tablet Take 1 tablet (300 mg total) by mouth daily. 90 tablet 0  . aspirin EC 81 MG tablet Take 81 mg by mouth daily.    Marland Kitchen atorvastatin (LIPITOR) 20 MG tablet Take 1 tablet (20 mg total) by mouth daily. 90 tablet 1  . Calcium Carb-Cholecalciferol (CALCIUM 600 + D PO) Take 1 tablet by mouth daily.    . carvedilol (COREG) 6.25 MG tablet TAKE 1 TABLET (6.25 MG TOTAL) BY MOUTH 2 (TWO) TIMES DAILY. PT NEEDS AN OV FOR FUTURE REFILLS 30 tablet 5  . diphenhydramine-acetaminophen (TYLENOL PM)  25-500 MG TABS Take 1 tablet by mouth at bedtime as needed (sleep).     . furosemide (LASIX) 40 MG tablet TAKE 1 TABLET BY MOUTH EVERY DAY 30 tablet 1  . glimepiride (AMARYL) 2 MG tablet TAKE 1 TABLET BY MOUTH DAILY BEFORE BREAKFAST. 90 tablet 1  . hyoscyamine (LEVSIN SL) 0.125 MG SL tablet Place 1 tablet (0.125 mg total) under the tongue every 6 (six) hours as needed. 30 tablet 0  . losartan (COZAAR) 25 MG tablet TAKE 1 TABLET BY MOUTH EVERY DAY 90 tablet 1  . meclizine (ANTIVERT) 12.5 MG tablet Take 1 tablet (12.5 mg total) by mouth 3 (three) times daily as needed for dizziness. As needed for   Dizziness or nausea 30 tablet 1  . metFORMIN (GLUCOPHAGE) 500 MG tablet 1/2 TABLET TWICE A DAY 90 tablet 1  . nystatin cream (MYCOSTATIN) Apply 1 application topically 2 (two) times daily. 30 g 1  . OneTouch Delica Lancets 17E MISC USE AS DIRECTED TO TEST DAILY 100 each 2  . ONETOUCH VERIO test strip CEHCK BLOOD SUGAR DAILY AS NEEDED 100 strip 2  . Vitamin D, Ergocalciferol, (DRISDOL) 1.25 MG (50000 UNIT) CAPS capsule TAKE 1 CAPSULE (50,000 UNITS TOTAL) BY MOUTH EVERY 7 (SEVEN) DAYS. 12 capsule 1   No current facility-administered medications for this visit.     Allergies: No Known Allergies  Physical exam:   Blood pressure (!) 123/56, pulse 81, temperature 97.8 F (36.6 C), temperature source Tympanic, resp. rate 18, height  5\' 4"  (1.626 m), weight 223 lb 4.8 oz (101.3 kg), SpO2 93 %.   ECOG 1    General appearance: Alert, awake without any distress. Head: Atraumatic without abnormalities Oropharynx: Without any thrush or ulcers. Eyes: No scleral icterus. Lymph nodes: No lymphadenopathy noted in the cervical, supraclavicular, or axillary nodes Heart:regular rate and rhythm, without any murmurs or gallops.   Lung: Clear to auscultation without any rhonchi, wheezes or dullness to percussion. Abdomin: Soft, nontender without any shifting dullness or ascites. Musculoskeletal: No clubbing or  cyanosis. Neurological: No motor or sensory deficits. Skin: No rashes or lesions.         Lab Results: Lab Results  Component Value Date   WBC 8.1 12/04/2019   HGB 12.3 12/04/2019   HCT 39.1 12/04/2019   MCV 100.0 12/04/2019   PLT 171 12/04/2019     Chemistry      Component Value Date/Time   NA 139 12/04/2019 0933   NA 139 08/23/2017 0933   NA 140 11/01/2016 0818   K 4.6 12/04/2019 0933   K 4.4 11/01/2016 0818   CL 103 12/04/2019 0933   CL 107 04/16/2012 0923   CO2 28 12/04/2019 0933   CO2 26 11/01/2016 0818   BUN 26 (H) 12/04/2019 0933   BUN 24 08/23/2017 0933   BUN 17.9 11/01/2016 0818   CREATININE 1.45 (H) 12/04/2019 0933   CREATININE 1.1 11/01/2016 0818   GLU 151 (H) 09/30/2019 1014      Component Value Date/Time   CALCIUM 10.7 (H) 12/04/2019 0933   CALCIUM 10.3 11/01/2016 0818   ALKPHOS 69 12/04/2019 0933   ALKPHOS 72 11/01/2016 0818   AST 51 (H) 12/04/2019 0933   AST 75 (H) 11/01/2016 0818   ALT 41 12/04/2019 0933   ALT 75 (H) 11/01/2016 0818   BILITOT 1.0 12/04/2019 0933   BILITOT 0.78 11/01/2016 0818       IMPRESSION: 1. Newly appreciable 0.3 cm right lower lobe pulmonary nodule in the posterior basal segment, surveillance is suggested. 2. Other imaging findings of potential clinical significance: Coronary atherosclerosis. Mild airway plugging in the right lower lobe. Hepatic cirrhosis. Descending and sigmoid colon diverticulosis. Stable likely reactive porta hepatis and peripancreatic lymph nodes. Hypodense lesion in the left omentum measuring 1.9 by 1.2 cm, formerly the same, highly likely to be incidental. Old left lateral fifth and sixth rib deformities with a small amount of bridging callus. Lumbar spondylosis and degenerative disc disease. 3. Aortic atherosclerosis.  Aortic Atherosclerosis (ICD10-I70.0).    Impression and Plan:   74 year old woman with:    1.    Left kidney cancer diagnosed in 2001.  She developed a stage  IV disease with pulmonary involvement that has been resected with clear-cell histology previously.    She is currently on active surveillance and has not required any systemic treatment.  CT scan obtained on 12/04/2019 was personally reviewed and discussed with the patient today.  She does have a cyst 0.3 cm right lower lobe pulmonary nodule that is questionable in etiology.  The differential diagnosis was reviewed at this time.  Metastatic malignancy is always a concern given her history of benign etiologies could also be a consideration.  Given the size at this time I would recommend close observation and repeat imaging studies in 4 to 6 months.  If this amount shows continuous growth I would be in favor of ablative therapy at this time given her likelihood of metastatic renal cell carcinoma.  I am not in favor  of surgical resection given her previous pulmonary surgeries as well as respiratory status.  2.    Right kidney tumor: No evidence of recurrence at this time.  3.  Follow-up: In 6 months for repeat evaluation.  30  minutes were spent on this encounter.  The time was dedicated to reviewing her disease status, discussing treatment options and future plan of care review.   Zola Button, MD 12/11/2019 9:43 AM

## 2019-12-15 ENCOUNTER — Other Ambulatory Visit: Payer: Self-pay | Admitting: *Deleted

## 2019-12-15 MED ORDER — CARVEDILOL 6.25 MG PO TABS
6.2500 mg | ORAL_TABLET | Freq: Two times a day (BID) | ORAL | 0 refills | Status: DC
Start: 2019-12-15 — End: 2019-12-24

## 2019-12-21 DIAGNOSIS — R69 Illness, unspecified: Secondary | ICD-10-CM | POA: Diagnosis not present

## 2019-12-24 ENCOUNTER — Other Ambulatory Visit: Payer: Self-pay | Admitting: Internal Medicine

## 2020-01-01 ENCOUNTER — Other Ambulatory Visit: Payer: Self-pay | Admitting: Internal Medicine

## 2020-01-01 ENCOUNTER — Telehealth: Payer: Self-pay

## 2020-01-01 NOTE — Telephone Encounter (Signed)
Pt stated she is returning a call about scheduling labs for an A1C, but I did not see any orders in for that.

## 2020-01-02 NOTE — Telephone Encounter (Signed)
Pt has been covered by other providers not seen in this office since July. Needs to contact other providers . No call documented from this office

## 2020-01-05 ENCOUNTER — Other Ambulatory Visit: Payer: Self-pay | Admitting: Internal Medicine

## 2020-01-06 ENCOUNTER — Other Ambulatory Visit: Payer: Self-pay | Admitting: Internal Medicine

## 2020-01-09 ENCOUNTER — Other Ambulatory Visit: Payer: Self-pay | Admitting: Family Medicine

## 2020-01-13 ENCOUNTER — Other Ambulatory Visit: Payer: Self-pay

## 2020-01-13 ENCOUNTER — Ambulatory Visit
Admission: RE | Admit: 2020-01-13 | Discharge: 2020-01-13 | Disposition: A | Discharge status: Home / Self Care | Payer: Medicare HMO | Source: Ambulatory Visit | Attending: Interventional Radiology | Admitting: Interventional Radiology

## 2020-01-13 ENCOUNTER — Encounter: Payer: Self-pay | Admitting: *Deleted

## 2020-01-13 DIAGNOSIS — C641 Malignant neoplasm of right kidney, except renal pelvis: Secondary | ICD-10-CM | POA: Diagnosis not present

## 2020-01-13 DIAGNOSIS — Z905 Acquired absence of kidney: Secondary | ICD-10-CM | POA: Diagnosis not present

## 2020-01-13 DIAGNOSIS — Z9889 Other specified postprocedural states: Secondary | ICD-10-CM | POA: Diagnosis not present

## 2020-01-13 DIAGNOSIS — N2889 Other specified disorders of kidney and ureter: Secondary | ICD-10-CM

## 2020-01-13 HISTORY — PX: IR RADIOLOGIST EVAL & MGMT: IMG5224

## 2020-01-13 NOTE — Progress Notes (Signed)
Patient ID: Amanda Crawford, female   DOB: 05/13/1945, 74 y.o.   MRN: [id number]       Chief Complaint: Patient was consulted remotely today (TeleHealth) for follow-up right renal cryoablation at the request of Maye Parkinson.    Referring Physician(s): Shadad  History of Present Illness: Amanda Crawford is a 74 y.o. female history of 2001 left nephrectomy for renal cell carcinoma   by [name], with concurrent cholecystectomy by [name].  2014  Solitary lung metastases x2 were resected from the left lung by Drs.[name] and [place],  On subsequent surveillance imaging, a new right lower pole enhancing renal mass was identified concerning for renal cell carcinoma.  No regional adenopathy.  No evidence of renal vein involvement. 05/24/2018 CT demonstrates slight increase in size of 1.4 cm hypervascular right renal mass   08/21/2018 technically successful CT-guided cryoablation of renal lesion without immediate complication.  Patient was discharged home the same day.  11/20/2018 CT showed ablation defect, no residual enhancing lesion 06/05/2019 CT shows embolization of ablation defect, no evidence of residual or recurrent lesion; no new metastatic disease, stable right cardiophrenic adenopathy  12/04/2019 CTShows no residual/recurrent tissue and ablation site. 3 mm nonspecific right lower lobe pulmonary nodule described. No new adenopathy or other suggestion of metastatic disease.   She is doing well.  No significant pain at the ablation site around the abdomen.  No hematuria.   Past Medical History:  Diagnosis Date  . Allergic state 03/12/2015  . Arthritis   . Colon polyp 11/19/2014  . Dermatitis 03/12/2015  . Diarrhea 06/29/2016  . Diverticulitis   . DM (diabetes mellitus), type 2 ([place]) 06/29/2016  . GERD (gastroesophageal reflux disease)    occ  . Gout 03/12/2015  . H/O measles   . H/O mumps   . Headache(784.0)    migraines  . History of chicken pox 11/23/2014  . Hyperglycemia  06/29/2016  . Lung cancer ([place])   . Migraine 11/23/2014  . Obesity 11/23/2014  . Pneumonia    child  . Preventative health care 09/12/2015  . Renal cell cancer ([place])    renal cell ca dx 9/01 and 8/08;  . Renal insufficiency   . Shortness of breath    occ  . Skin lesion of breast 03/12/2015    Past Surgical History:  Procedure Laterality Date  . ABDOMINAL HYSTERECTOMY    . APPENDECTOMY    . CARDIAC CATHETERIZATION     yrs ago neg  . CHOLECYSTECTOMY    . COLONOSCOPY  2018  . ESOPHAGOGASTRODUODENOSCOPY     years ago  . IR RADIOLOGIST EVAL & MGMT  07/17/2018  . IR RADIOLOGIST EVAL & MGMT  09/12/2018  . IR RADIOLOGIST EVAL & MGMT  06/18/2019  . KIDNEY SURGERY  2001   Removed of left kidney   . LUNG CANCER SURGERY Left 08  . RADIOLOGY WITH ANESTHESIA N/A 08/21/2018   Procedure: CT WITH ANESTHESIA RENAL CRYOABLATION;  Surgeon: Arne Cleveland, MD;  Location: [facility];  Service: Radiology;  Laterality: N/A;  . RENAL MASS EXCISION Left 01  . RIGHT/LEFT HEART CATH AND CORONARY ANGIOGRAPHY N/A 07/27/2016   Procedure: Right/Left Heart Cath and Coronary Angiography;  Surgeon: Leonie Man, MD;  Location: [facility];  Service: Cardiovascular;  Laterality: N/A;  . THORACOTOMY Left 05/09/2012   Procedure: THORACOTOMY MAJOR;  Surgeon: Gaye Pollack, MD;  Location: [facility];  Service: Thoracic;  Laterality: Left;  Marland Kitchen VIDEO BRONCHOSCOPY N/A 05/09/2012   Procedure: VIDEO BRONCHOSCOPY;  Surgeon: Gaye Pollack, MD;  Location: [facility];  Service: Thoracic;  Laterality: N/A;  . WEDGE RESECTION Left 05/09/2012   Procedure: LEFT UPPER LOBE WEDGE RESECTION;  Surgeon: Gaye Pollack, MD;  Location: [facility];  Service: Thoracic;  Laterality: Left;    Allergies: Patient has no known allergies.  Medications: Prior to Admission medications   Medication Sig Start Date End Date Taking? Authorizing Provider  allopurinol (ZYLOPRIM) 300 MG tablet Take 1 tablet (300 mg total) by mouth daily. 12/10/19   [name], MD  aspirin EC 81 MG tablet Take 81 mg by mouth daily.    [provider]  atorvastatin (LIPITOR) 20 MG tablet Take 1 tablet (20 mg total) by mouth daily. 08/06/19   [name], MD  Calcium Carb-Cholecalciferol (CALCIUM 600 + D PO) Take 1 tablet by mouth daily.    [provider]  carvedilol (COREG) 6.25 MG tablet TAKE 1 TABLET (6.25 MG TOTAL) BY MOUTH 2 (TWO) TIMES DAILY. PT NEEDS AN OV FOR FUTURE REFILLS 12/24/19   Hilty, Nadean Corwin, MD  diphenhydramine-acetaminophen (TYLENOL PM) 25-500 MG TABS Take 1 tablet by mouth at bedtime as needed (sleep).     [provider]  furosemide (LASIX) 40 MG tablet TAKE 1 TABLET BY MOUTH EVERY DAY 12/11/19   Hilty, Nadean Corwin, MD  glimepiride (AMARYL) 2 MG tablet TAKE 1 TABLET BY MOUTH DAILY BEFORE BREAKFAST. 07/21/19   [name], MD  hyoscyamine (LEVSIN SL) 0.125 MG SL tablet Place 1 tablet (0.125 mg total) under the tongue every 6 (six) hours as needed. 07/07/19   [name], MD  losartan (COZAAR) 25 MG tablet TAKE 1 TABLET BY MOUTH EVERY DAY 01/01/20   Hilty, Nadean Corwin, MD  meclizine (ANTIVERT) 12.5 MG tablet Take 1 tablet (12.5 mg total) by mouth 3 (three) times daily as needed for dizziness. As needed for   Dizziness or nausea 11/23/14   [name], MD  metFORMIN (GLUCOPHAGE) 500 MG tablet TAKE 1 TABLET (500 MG TOTAL) BY MOUTH 2 (TWO) TIMES DAILY WITH A MEAL. 01/09/20   [name], MD  nystatin cream (MYCOSTATIN) Apply 1 application topically 2 (two) times daily. 06/07/17   [name], MD  OneTouch Delica Lancets 85I MISC USE AS DIRECTED TO TEST DAILY 09/25/19   [name], MD  [facility] VERIO test strip [facility] BLOOD SUGAR DAILY AS NEEDED 07/28/19   [name], MD  Vitamin D, Ergocalciferol, (DRISDOL) 1.25 MG (50000 UNIT) CAPS capsule TAKE 1 CAPSULE (50,000 UNITS TOTAL) BY MOUTH EVERY 7 (SEVEN) DAYS. 09/02/19   [name], MD  glyBURIDE-metformin [name]) 1.25-250 MG tablet Take 1 tablet by  mouth 2 (two) times daily with a meal. 03/25/19 03/25/19  [name], MD     Family History  Problem Relation Age of Onset  . Heart failure Father   . Diabetes Father   . Hyperlipidemia Father   . Heart disease Father   . Hypertension Father   . Asthma Brother   . Asthma Daughter   . Cancer Paternal Grandmother   . Hearing loss Paternal Grandfather   . Stroke Paternal Grandfather   . Alcohol abuse Maternal Aunt   . Colon cancer Paternal Aunt     Social History   Socioeconomic History  . Marital status: Married    Spouse name: Not on file  . Number of children: Not on file  . Years of education: Not on file  .  Highest education level: Not on file  Occupational History  . Not on file  Tobacco Use  . Smoking status: Former Smoker    Packs/day: 0.50    Years: 35.00    Pack years: 17.50    Types: Cigarettes    Quit date: 05/08/2006    Years since quitting: 13.6  . Smokeless tobacco: Never Used  Vaping Use  . Vaping Use: Never used  Substance and Sexual Activity  . Alcohol use: Yes    Comment: seldomly  . Drug use: No  . Sexual activity: Not on file    Comment: lives with husband, no dietary restrictions.   Other Topics Concern  . Not on file  Social History Narrative  . Not on file   Social Determinants of Health   Financial Resource Strain: Not on file  Food Insecurity: Not on file  Transportation Needs: Not on file  Physical Activity: Not on file  Stress: Not on file  Social Connections: Not on file    ECOG Status: 0 - Asymptomatic  Review of Systems  Review of Systems: A 12 point ROS discussed and pertinent positives are indicated in the HPI above.  All other systems are negative.  Physical Exam No direct physical exam was performed (except for noted visual exam findings with Video Visits).     Vital Signs: There were no vitals taken for this visit.  Imaging: No results found.  Labs:  CBC: Recent Labs    04/28/19 1138 06/05/19 0933  08/05/19 0957 12/04/19 0933  WBC 8.3 7.2 8.2 8.1  HGB 12.1 12.2 12.3 12.3  HCT 36.2 38.4 37.9 39.1  PLT 185.0 175 176.0 171    COAGS: Recent Labs    09/30/19 1014  INR 1.1*    BMP: Recent Labs    06/05/19 0933 08/05/19 0957 08/20/19 0836 12/04/19 0933  NA 140 138 136 139  K 4.8 5.2 No hemolysis seen* 4.6 4.6  CL 109 103 102 103  CO2 23 25 25 28   GLUCOSE 127* 147* 195* 147*  BUN 28* 43* 27* 26*  CALCIUM 9.9 10.7* CANCELED  10.3 10.7*  CREATININE 1.35* 1.42* 1.27* 1.45*  GFRNONAA 38*  --   --  37*  GFRAA 44*  --   --   --     LIVER FUNCTION TESTS: Recent Labs    06/05/19 0933 08/05/19 0957 08/20/19 0836 12/04/19 0933  BILITOT 0.6 0.6 0.7 1.0  AST 43* 44* 47* 51*  ALT 34 39* 41* 41  ALKPHOS 65 60 64 69  PROT 7.6 7.3 7.1 7.9  ALBUMIN 3.9 4.4 4.0 4.1    TUMOR MARKERS: No results for input(s): AFPTM, CEA, CA199, CHROMGRNA in the last 8760 hours.  Assessment and Plan: My impression is that the patient is doing very well post cryoablation of right renal neoplasm in this patient with a history of left nephrectomy. Stable renal function. No evidence of residual/recurrent lesion on follow-up imaging. The 3 mm lung nodule is very nonspecific. However, follow-up indicated. I reviewed the findings with the patient. She seemed to understand, was very pleasant as always, and asked appropriate questions which were answered.   Plan: Follow-up CT  in 6 months to assess lung nodule.  If this looks stable or smaller, we can switch to annual for total of 5 years surveillance. Follow creatinine. Follow-up with me after   June 2022 scan, clinical or virtual. She knows to telephone in the interval with any questions or problems related to the right  renal cryoablation.   Thank you for this interesting consult.  I greatly enjoyed meeting Amanda Crawford and look forward to participating in their care.  A copy of this report was sent to the requesting provider on this  date.   Thank you for this interesting consult.  I greatly enjoyed meeting Amanda Crawford and look forward to participating in their care.  A copy of this report was sent to the requesting provider on this date.  Electronically Signed: Rickard Rhymes 01/13/2020, 2:46 PM   I spent a total of     15 Minutes in remote  clinical consultation, greater than 50% of which was counseling/coordinating care for right renal neoplasm, post ablation.    Visit type: Audio only (telephone). Audio (no video) only due to patient's lack of internet/smartphone capability. Alternative for in-person consultation at [facility], [address] [place], [place], [place]. This visit type was conducted due to national recommendations for restrictions regarding the COVID-19 Pandemic (e.g. social distancing).  This format is felt to be most appropriate for this patient at this time.  All issues noted in this document were discussed and addressed.

## 2020-01-14 ENCOUNTER — Ambulatory Visit: Payer: Medicare HMO | Admitting: Gastroenterology

## 2020-01-14 ENCOUNTER — Encounter: Payer: Self-pay | Admitting: Gastroenterology

## 2020-01-14 VITALS — BP 134/78 | HR 76 | Ht 64.0 in | Wt 219.0 lb

## 2020-01-14 DIAGNOSIS — K7581 Nonalcoholic steatohepatitis (NASH): Secondary | ICD-10-CM

## 2020-01-14 DIAGNOSIS — Z8619 Personal history of other infectious and parasitic diseases: Secondary | ICD-10-CM

## 2020-01-14 DIAGNOSIS — R159 Full incontinence of feces: Secondary | ICD-10-CM | POA: Diagnosis not present

## 2020-01-14 NOTE — Patient Instructions (Signed)
If you are age 76 or older, your body mass index should be between 23-30. Your Body mass index is 37.59 kg/m. If this is out of the aforementioned range listed, please consider follow up with your Primary Care Provider.  If you are age 33 or younger, your body mass index should be between 19-25. Your Body mass index is 37.59 kg/m. If this is out of the aformentioned range listed, please consider follow up with your Primary Care Provider.    Due to recent changes in healthcare laws, you may see the results of your imaging and laboratory studies on MyChart before your provider has had a chance to review them.  We understand that in some cases there may be results that are confusing or concerning to you. Not all laboratory results come back in the same time frame and the provider may be waiting for multiple results in order to interpret others.  Please give [place] 48 hours in order for your provider to thoroughly review all the results before contacting the office for clarification of your results.   Thank you for choosing me and [facility].  Vito Cirigliano, D.O.

## 2020-01-14 NOTE — Progress Notes (Signed)
P  Chief Complaint:    C diff follow-up  GI History: SHEMECA LUKASIK is a 74 y.o. female with a history of renal cell cancer, lung cancer, DM, GERD, obesity (BMI 37.5), hyperlipidemia, gout, systolic heart failure, previous hysterectomy, appendectomy, cholecystectomy, lung cancer surgery (left thoracotomy with wedge resection), left nephrectomy 2001, cryoablation of right renal mass 07/2018 initially seen in the GI clinic on 09/11/2019 for evaluation of episodic diarrhea and abdominal pain.  No hematochezia or melena.  History of episodic explosive diarrhea approximately once/week with associated LLQ pain, but symptoms worsening at that time.  Symptoms occur at random with clear exacerbating factors.  Symptoms have been present on/off for many years.  Has had episodes of urge incontinence without fecal seepage.  Previously prescribed hyoscyamine prn which helps with pain episodes. -GI PCR panel with C. difficile toxin positive; treated with fidaxomicin and probiotics in 08/2019 with clinical improvement -Normal pancreatic fecal elastase, fecal calprotectin -Continued Benefiber  Previously followed by [facility], with colonoscopy 09/2016. Was hospitalized in [place] in 12/2018 with left sided abdominal pain with essentially unrevealing w/u per patient (no inpatient records for review).    Separately with mildly elevated liver enzymes in 07/2019 (AST/ALT 47/41, stable from previous).  Further work-up as below: -11/20/2018: CT abdomen/pelvis: Nodular hepatic contours, mild hepatic steatosis.  Normal pancreas.  Reactive lymph nodes near the porta hepatis. -06/05/2019: CT abdomen/pelvis: Cirrhotic appearing liver, 6 mm right hepatic lobe lesion similar to previous exam, no duct dilatation, fatty replacement of the pancreas.:  Diverticulosis otherwise normal GI tract.  Moderate pelvic floor laxity -APRI score 0.72 which indicates significant fibrosis or possibly cirrhosis -Negative/normal  extended serologic work-up for concomitant liver disease.  Normal INR -RUQ [place] (08/2019): Cirrhotic appearing liver without liver lesions.  Patent PV with appropriate flow.  S/p ccy -Ultrasound elastography (08/2019): Nodular contour on ultrasound but normal elastography portion without increased liver stiffness -FibroSure panel: Highly suggestive of NASH F3 -CT C/A/P (12/2019; malignancy surveillance): Hepatic cirrhosis without HCC, diverticulosis, stable reactive porta hepatis and peripancreatic lymph nodes. 3 mm RLL nodule      Endoscopic History: -Colonoscopy (09/2016, [facility]): 1 polyp in transverse colon, mild sigmoid diverticulosis.  Repeat in 5 years -Colonoscopy (08/2013, [facility]): 5 polyps (tubular adenomas, SSP's, hyperplastic polyp), sigmoid diverticulosis, cecal AVM.  Repeat 3 years  HPI:     Patient is a 74 y.o. female presenting to the Gastroenterology Clinic for follow-up.  Initially seen by me on 09/11/2019 for evaluation of diarrhea, fecal incontinence, and LLQ pain as outlined above.  Diagnosed with C. difficile toxin positive on GI PCR panel-treated with fidaxomicin and probiotics.    Today, states she overall feels better. Very rare episode of fecal urgency with incontinence. Still taking Benefiber daily. Good PO intake.   Reduced exercise tolerance due to underlying lung issues/prior lung surgeries.   Review of systems:     No chest pain, no SOB, no fevers, no urinary sx   Past Medical History:  Diagnosis Date  . Allergic state 03/12/2015  . Arthritis   . Colon polyp 11/19/2014  . Dermatitis 03/12/2015  . Diarrhea 06/29/2016  . Diverticulitis   . DM (diabetes mellitus), type 2 ([place]) 06/29/2016  . GERD (gastroesophageal reflux disease)    occ  . Gout 03/12/2015  . H/O measles   . H/O mumps   . Headache(784.0)    migraines  . History of chicken pox 11/23/2014  . Hyperglycemia 06/29/2016  . Lung cancer ([place])   .  Migraine 11/23/2014  . Obesity  11/23/2014  . Pneumonia    child  . Preventative health care 09/12/2015  . Renal cell cancer ([place])    renal cell ca dx 9/01 and 8/08;  . Renal insufficiency   . Shortness of breath    occ  . Skin lesion of breast 03/12/2015    Patient's surgical history, family medical history, social history, medications and allergies were all reviewed in Epic    Current Outpatient Medications  Medication Sig Dispense Refill  . allopurinol (ZYLOPRIM) 300 MG tablet Take 1 tablet (300 mg total) by mouth daily. 90 tablet 0  . aspirin EC 81 MG tablet Take 81 mg by mouth daily.    Marland Kitchen atorvastatin (LIPITOR) 20 MG tablet Take 1 tablet (20 mg total) by mouth daily. 90 tablet 1  . Calcium Carb-Cholecalciferol (CALCIUM 600 + D PO) Take 1 tablet by mouth daily.    . carvedilol (COREG) 6.25 MG tablet TAKE 1 TABLET (6.25 MG TOTAL) BY MOUTH 2 (TWO) TIMES DAILY. PT NEEDS AN OV FOR FUTURE REFILLS 30 tablet 0  . diphenhydramine-acetaminophen (TYLENOL PM) 25-500 MG TABS Take 1 tablet by mouth at bedtime as needed (sleep).     . furosemide (LASIX) 40 MG tablet TAKE 1 TABLET BY MOUTH EVERY DAY 30 tablet 1  . glimepiride (AMARYL) 2 MG tablet TAKE 1 TABLET BY MOUTH DAILY BEFORE BREAKFAST. 90 tablet 1  . hyoscyamine (LEVSIN SL) 0.125 MG SL tablet Place 1 tablet (0.125 mg total) under the tongue every 6 (six) hours as needed. 30 tablet 0  . losartan (COZAAR) 25 MG tablet TAKE 1 TABLET BY MOUTH EVERY DAY 90 tablet 1  . meclizine (ANTIVERT) 12.5 MG tablet Take 1 tablet (12.5 mg total) by mouth 3 (three) times daily as needed for dizziness. As needed for   Dizziness or nausea 30 tablet 1  . metFORMIN (GLUCOPHAGE) 500 MG tablet TAKE 1 TABLET (500 MG TOTAL) BY MOUTH 2 (TWO) TIMES DAILY WITH A MEAL. 180 tablet 0  . nystatin cream (MYCOSTATIN) Apply 1 application topically 2 (two) times daily. 30 g 1  . OneTouch Delica Lancets 16X MISC USE AS DIRECTED TO TEST DAILY 100 each 2  . ONETOUCH VERIO test strip CEHCK BLOOD SUGAR DAILY AS  NEEDED 100 strip 2  . Vitamin D, Ergocalciferol, (DRISDOL) 1.25 MG (50000 UNIT) CAPS capsule TAKE 1 CAPSULE (50,000 UNITS TOTAL) BY MOUTH EVERY 7 (SEVEN) DAYS. 12 capsule 1   No current facility-administered medications for this visit.    Physical Exam:     BP 134/78   Pulse 76   Ht 5\' 4"  (1.626 m)   Wt 219 lb (99.3 kg)   BMI 37.59 kg/m   GENERAL:  Pleasant female in NAD PSYCH: : Cooperative, normal affect EENT:  conjunctiva pink, mucous membranes moist, neck supple without masses ABDOMEN: Left flank scar, well-healed.  Minimal TTP near left flank scar line.  Does have asymmetry of abdominal cavity above left flank scar, but no TTP, rebound, guarding.  No peritoneal signs.  Abdomen otherwise soft, nondistended. No obvious masses, no hepatomegaly,  normal bowel sounds SKIN:  turgor, no lesions seen Musculoskeletal:  Normal muscle tone, normal strength NEURO: Alert and oriented x 3, no focal neurologic deficits   IMPRESSION and PLAN:    1) Fecal incontinence -Longstanding history of intermittent fecal urgency with incontinence.  No seepage in between these episodes.  Suspect some degree of reduced sphincter tone or perhaps some decreased rectal sensation.  Has overall  improved with conservative management -Continue Benefiber -Okay to use Imodium or other antidiarrheal on demand, particularly prior to travel, etc. -If symptoms return/worsen, plan for ARM and possible colonoscopy with biopsies  2) History of C. difficile -Symptoms much improved after course of fidaxomicin and probiotics  3) NASH  -Discussed the extensive serologic and radiographic work-up for liver disease.  Essentially with radiographic evidence of NASH, but overall seems to have preserved hepatic synthetic function based on labs and imaging -Discussed limited treatment options. Could consider pioglitazone if any plans of changing DM meds in the future, but data not conclusive enough to make an empiric change for  fatty liver disease alone. -Change to black coffee  - Yearly CMP and INR  4) History of colon polyps -In the absence of new symptoms, repeat colonoscopy in 2023 for ongoing surveillance   - RTC in 1 year, or sooner prn  I spent 45 minutes of time, including in depth chart review, independent review of results as outlined above, communicating results with the patient directly, face-to-face time with the patient, coordinating care, ordering studies and medications as appropriate, and documentation.           Lavena Bullion ,DO, FACG 01/14/2020, 11:23 AM

## 2020-01-18 ENCOUNTER — Other Ambulatory Visit: Payer: Self-pay | Admitting: Family Medicine

## 2020-01-23 DIAGNOSIS — R69 Illness, unspecified: Secondary | ICD-10-CM | POA: Diagnosis not present

## 2020-02-11 ENCOUNTER — Other Ambulatory Visit: Payer: Self-pay | Admitting: Family Medicine

## 2020-02-11 ENCOUNTER — Other Ambulatory Visit: Payer: Self-pay | Admitting: Internal Medicine

## 2020-02-12 ENCOUNTER — Telehealth: Payer: Self-pay | Admitting: Internal Medicine

## 2020-02-12 NOTE — Telephone Encounter (Signed)
*  STAT* If patient is at the pharmacy, call can be transferred to refill team.   1. Which medications need to be refilled? (please list name of each medication and dose if known)  Carvedilol  2. Which pharmacy/location (including street and city if local pharmacy) is medication to be sent to? [place], [place]  3. Do they need a 30 day or 90 day supply?enough until her appt on 2-4-211

## 2020-02-13 MED ORDER — CARVEDILOL 6.25 MG PO TABS: 6.2500 mg | ORAL_TABLET | Freq: Two times a day (BID) | ORAL | 2 refills | Status: DC

## 2020-02-17 ENCOUNTER — Other Ambulatory Visit: Payer: Self-pay | Admitting: Family Medicine

## 2020-02-17 ENCOUNTER — Other Ambulatory Visit: Payer: Self-pay | Admitting: Internal Medicine

## 2020-03-01 DIAGNOSIS — M5412 Radiculopathy, cervical region: Secondary | ICD-10-CM | POA: Diagnosis not present

## 2020-03-01 DIAGNOSIS — M9901 Segmental and somatic dysfunction of cervical region: Secondary | ICD-10-CM | POA: Diagnosis not present

## 2020-03-02 ENCOUNTER — Other Ambulatory Visit: Payer: Self-pay | Admitting: Family Medicine

## 2020-03-04 ENCOUNTER — Encounter: Payer: Self-pay | Admitting: Internal Medicine

## 2020-03-04 ENCOUNTER — Ambulatory Visit: Payer: Medicare HMO | Admitting: Internal Medicine

## 2020-03-04 ENCOUNTER — Other Ambulatory Visit: Payer: Self-pay

## 2020-03-04 VITALS — BP 114/60 | HR 89 | Ht 64.0 in | Wt 219.6 lb

## 2020-03-04 DIAGNOSIS — I5022 Chronic systolic (congestive) heart failure: Secondary | ICD-10-CM | POA: Diagnosis not present

## 2020-03-04 DIAGNOSIS — I10 Essential (primary) hypertension: Secondary | ICD-10-CM | POA: Diagnosis not present

## 2020-03-04 DIAGNOSIS — I447 Left bundle-branch block, unspecified: Secondary | ICD-10-CM

## 2020-03-04 DIAGNOSIS — I428 Other cardiomyopathies: Secondary | ICD-10-CM

## 2020-03-04 NOTE — Patient Instructions (Signed)
Medication Instructions:  Your physician recommends that you continue on your current medications as directed. Please refer to the Current Medication list given to you today.  *If you need a refill on your cardiac medications before your next appointment, please call your pharmacy*   Lab Work: NONE If you have labs (blood work) drawn today and your tests are completely normal, you will receive your results only by: Marland Kitchen MyChart Message (if you have MyChart) OR . A paper copy in the mail If you have any lab test that is abnormal or we need to change your treatment, we will call you to review the results.   Testing/Procedures: Your physician has requested that you have an echocardiogram. Echocardiography is a painless test that uses sound waves to create images of your heart. It provides your doctor with information about the size and shape of your heart and how well your heart's chambers and valves are working. This procedure takes approximately one hour. There are no restrictions for this procedure. -- [address] - [address]rd Floor   Follow-Up: At [facility], you and your health needs are our priority.  As part of our continuing mission to provide you with exceptional heart care, we have created designated Provider Care Teams.  These Care Teams include your primary Cardiologist (physician) and Advanced Practice Providers (APPs -  Physician Assistants and Nurse Practitioners) who all work together to provide you with the care you need, when you need it.  We recommend signing up for the patient portal called "MyChart".  Sign up information is provided on this After Visit Summary.  MyChart is used to connect with patients for Virtual Visits (Telemedicine).  Patients are able to view lab/test results, encounter notes, upcoming appointments, etc.  Non-urgent messages can be sent to your provider as well.   To learn more about what you can do with MyChart, go to [email or web address].     Your next appointment:   1 year   The format for your next appointment:   In Person  Provider:   You may see Dr. Debara Pickett or one of the following Advanced Practice Providers on your designated Care Team:    Almyra Deforest, PA-C  Fabian Sharp, [place] or   Roby Lofts, [place]    Other Instructions

## 2020-03-04 NOTE — Progress Notes (Signed)
OFFICE CONSULT NOTE  Chief Complaint:  Follow-up heart failure  Primary Care Physician: [name], MD  HPI:  Amanda Crawford is a 75 y.o. female who is being seen today for the evaluation of shortness of breath at the request of [name], MD. Amanda Crawford has a past medical history significant for known left bundle branch block dating back to 2008. At the time she had presented with chest pain that radiated to her back and was found to have the abnormal EKG which led to left heart catheterization in January 2009. This was with Dr. Tamala Julian and indicated angiographically normal coronaries. Subsequently she was found to have renal cell carcinoma and underwent nephrectomy and ultimately was found to have recurrent metastatic renal disease to the long and required 2 partial lobectomies, the last in 2014. She also is moderately obese and has mild dyslipidemia on atorvastatin, but is not diabetic. She reports her shortness of breath has recently worsened and she finds that she has difficulty for example singing at church or caring on a long conversation. She also gets fatigue easier and has difficulty walking up stairs or laying flat. She does report a mild nonproductive cough and slight wheezing. Her PCP ordered an echocardiogram, performed on 07/12/2016 which demonstrated a newly decreased EF to 35-40% from 50%. There was LVH and mild diastolic dysfunction as well as mild mitral regurgitation.  11/08/2016  Amanda Crawford returns today for follow-up. She reports improvement in her heart failure symptoms. Her diuretic was increased to 40 mg daily. She also had an increase in carvedilol to 6.25 mg twice a day. She is on a low-dose are due to having a solitary kidney. In June she underwent left and right heart catheterization by Dr. Ellyn Hack which demonstrated a mild nonobstructive coronary disease which is out of proportion for LV function. She was felt to be volume overloaded necessitating the above  changes. Since then she says she's done very well. Weight is down at least 4 pounds but is been as much is 8 pounds lower since June.  07/30/2017  Amanda Crawford returns today for follow-up.  I last saw her in October.  At the time we made additional adjustments on her medications started low-dose losartan.  The plan was to recheck an echocardiogram to see if LV function had improved.  She did have a repeat echocardiogram in April 2019 which showed EF had improved to 45 to 50%.  He does have persistent left bundle branch block.  We had considered CRT-P therapy, however since her LVEF has improved that is not likely to be necessary.  Blood pressures noted to be low normal today 106/58 but she says generally runs around 161-096 systolic.  Symptomatically she is doing well without any worsening shortness of breath or chest pain.  03/04/2020  Amanda Crawford returns today for follow-up.  Seems to be doing pretty well.  She endorses NYHA class I-II symptoms.  EF had been low in the past but improved possibly related to left bundle branch block.  She had nonobstructive coronary disease by cath.  She is not had a repeat echo since 2019.  Blood pressure is excellent today.  PMHx:  Past Medical History:  Diagnosis Date  . Allergic state 03/12/2015  . Arthritis   . Colon polyp 11/19/2014  . Dermatitis 03/12/2015  . Diarrhea 06/29/2016  . Diverticulitis   . DM (diabetes mellitus), type 2 ([place]) 06/29/2016  . GERD (gastroesophageal reflux disease)    occ  .  Gout 03/12/2015  . H/O measles   . H/O mumps   . Headache(784.0)    migraines  . History of chicken pox 11/23/2014  . Hyperglycemia 06/29/2016  . Lung cancer ([place])   . Migraine 11/23/2014  . Obesity 11/23/2014  . Pneumonia    child  . Preventative health care 09/12/2015  . Renal cell cancer ([place])    renal cell ca dx 9/01 and 8/08;  . Renal insufficiency   . Shortness of breath    occ  . Skin lesion of breast 03/12/2015    Past Surgical History:   Procedure Laterality Date  . ABDOMINAL HYSTERECTOMY    . APPENDECTOMY    . CARDIAC CATHETERIZATION     yrs ago neg  . CHOLECYSTECTOMY    . COLONOSCOPY  2018  . ESOPHAGOGASTRODUODENOSCOPY     years ago  . IR RADIOLOGIST EVAL & MGMT  07/17/2018  . IR RADIOLOGIST EVAL & MGMT  09/12/2018  . IR RADIOLOGIST EVAL & MGMT  06/18/2019  . IR RADIOLOGIST EVAL & MGMT  01/13/2020  . KIDNEY SURGERY  2001   Removed of left kidney   . LUNG CANCER SURGERY Left 08  . RADIOLOGY WITH ANESTHESIA N/A 08/21/2018   Procedure: CT WITH ANESTHESIA RENAL CRYOABLATION;  Surgeon: Arne Cleveland, MD;  Location: [facility];  Service: Radiology;  Laterality: N/A;  . RENAL MASS EXCISION Left 01  . RIGHT/LEFT HEART CATH AND CORONARY ANGIOGRAPHY N/A 07/27/2016   Procedure: Right/Left Heart Cath and Coronary Angiography;  Surgeon: Leonie Man, MD;  Location: [facility];  Service: Cardiovascular;  Laterality: N/A;  . THORACOTOMY Left 05/09/2012   Procedure: THORACOTOMY MAJOR;  Surgeon: Gaye Pollack, MD;  Location: [facility];  Service: Thoracic;  Laterality: Left;  Marland Kitchen VIDEO BRONCHOSCOPY N/A 05/09/2012   Procedure: VIDEO BRONCHOSCOPY;  Surgeon: Gaye Pollack, MD;  [facility];  Service: Thoracic;  Laterality: N/A;  . WEDGE RESECTION Left 05/09/2012   Procedure: LEFT UPPER LOBE WEDGE RESECTION;  Surgeon: Gaye Pollack, MD;  Location: [facility] OR;  Service: Thoracic;  Laterality: Left;    FAMHx:  Family History  Problem Relation Age of Onset  . Heart failure Father   . Diabetes Father   . Hyperlipidemia Father   . Heart disease Father   . Hypertension Father   . Asthma Brother   . Asthma Daughter   . Cancer Paternal Grandmother   . Hearing loss Paternal Grandfather   . Stroke Paternal Grandfather   . Alcohol abuse Maternal Aunt   . Colon cancer Paternal Aunt     SOCHx:   reports that she quit smoking about 13 years ago. Her smoking use included cigarettes. She has a 17.50 pack-year smoking history. She has  never used smokeless tobacco. She reports current alcohol use. She reports that she does not use drugs.  ALLERGIES:  No Known Allergies  ROS: Pertinent items noted in HPI and remainder of comprehensive ROS otherwise negative.  HOME MEDS: Current Outpatient Medications on File Prior to Visit  Medication Sig Dispense Refill  . allopurinol (ZYLOPRIM) 300 MG tablet Take 1 tablet (300 mg total) by mouth daily. 30 tablet 0  . aspirin EC 81 MG tablet Take 81 mg by mouth daily.    Marland Kitchen atorvastatin (LIPITOR) 20 MG tablet Take 1 tablet (20 mg total) by mouth daily. 90 tablet 1  . Calcium Carb-Cholecalciferol (CALCIUM 600 + D PO) Take 1 tablet by mouth daily.    . carvedilol (COREG) 6.25  MG tablet Take 1 tablet (6.25 mg total) by mouth 2 (two) times daily. Pt needs an OV for future refills 30 tablet 2  . diphenhydramine-acetaminophen (TYLENOL PM) 25-500 MG TABS Take 1 tablet by mouth at bedtime as needed (sleep).     . furosemide (LASIX) 40 MG tablet TAKE 1 TABLET BY MOUTH EVERY DAY 30 tablet 1  . glimepiride (AMARYL) 2 MG tablet Take 1 tablet (2 mg total) by mouth daily with breakfast. 30 tablet 0  . hyoscyamine (LEVSIN SL) 0.125 MG SL tablet Place 1 tablet (0.125 mg total) under the tongue every 6 (six) hours as needed. 30 tablet 0  . losartan (COZAAR) 25 MG tablet TAKE 1 TABLET BY MOUTH EVERY DAY 90 tablet 1  . meclizine (ANTIVERT) 12.5 MG tablet Take 1 tablet (12.5 mg total) by mouth 3 (three) times daily as needed for dizziness. As needed for   Dizziness or nausea 30 tablet 1  . metFORMIN (GLUCOPHAGE) 500 MG tablet Take 1 tablet (500 mg total) by mouth 2 (two) times daily with a meal. 60 tablet 0  . nystatin cream (MYCOSTATIN) Apply 1 application topically 2 (two) times daily. 30 g 1  . OneTouch Delica Lancets 40J MISC USE AS DIRECTED TO TEST DAILY 100 each 2  . ONETOUCH VERIO test strip CEHCK BLOOD SUGAR DAILY AS NEEDED 100 strip 12  . Vitamin D, Ergocalciferol, (DRISDOL) 1.25 MG (50000 UNIT) CAPS  capsule TAKE 1 CAPSULE (50,000 UNITS TOTAL) BY MOUTH EVERY 7 (SEVEN) DAYS. 12 capsule 1  . [DISCONTINUED] glyBURIDE-metformin (GLUCOVANCE) 1.25-250 MG tablet Take 1 tablet by mouth 2 (two) times daily with a meal. 60 tablet 3   No current facility-administered medications on file prior to visit.    LABS/IMAGING: No results found for this or any previous visit (from the past 48 hour(s)). No results found.  LIPID PANEL:    Component Value Date/Time   CHOL 193 09/30/2019 1014   TRIG 267 (H) 09/30/2019 1014   HDL 52.00 08/05/2019 0957   CHOLHDL 4 08/05/2019 0957   VLDL 61.2 (H) 08/05/2019 0957   LDLCALC (H) 01/31/2007 0800    117        Total Cholesterol/HDL:CHD Risk Coronary Heart Disease Risk Table                     Men   Women  1/2 Average Risk   3.4   3.3  Average Risk       5.0   4.4  2 X Average Risk   9.6   7.1  3 X Average Risk  23.4   11.0        Use the calculated Patient Ratio above and the CHD Risk Table to determine the patient's CHD Risk.        ATP III CLASSIFICATION (LDL):  <100     mg/dL   Optimal  100-129  mg/dL   Near or Above                    Optimal  130-159  mg/dL   Borderline  160-189  mg/dL   High  >190     mg/dL   Very High   LDLDIRECT 71.0 08/05/2019 0957    WEIGHTS: Wt Readings from Last 3 Encounters:  03/04/20 219 lb 9.6 oz (99.6 kg)  01/14/20 219 lb (99.3 kg)  12/11/19 223 lb 4.8 oz (101.3 kg)    VITALS: BP 114/60   Pulse 89   Ht  5\' 4"  (1.626 m)   Wt 219 lb 9.6 oz (99.6 kg)   SpO2 96%   BMI 37.69 kg/m   EXAM: General appearance: alert and no distress Neck: no carotid bruit and no JVD Lungs: clear to auscultation bilaterally Heart: regular rate and rhythm, S1, S2 normal, no murmur, click, rub or gallop Abdomen: soft, non-tender; bowel sounds normal; no masses,  no organomegaly Extremities: edema Trace sock line edema Pulses: 2+ and symmetric Skin: Skin color, texture, turgor normal. No rashes or lesions Neurologic: Grossly  normal Psych: Pleasant  EKG: Normal sinus rhythm 89, LBBB-personally reviewed  ASSESSMENT: 1. NICM/Chronic systolic congestive heart failure, LVEF improved 45 to 50% (04/2017) from 35-40% 2. LBBB 3. Mild non-obstructive CAD by cath (06/2016) 4. Dyslipidemia  PLAN: 1.   Amanda Crawford has NYHA class I-II symptoms and seems to be pretty stable.  Her last echo in 2019 showed EF 45 to 50%.  This is probably related to left bundle branch block.  Blood pressure is low today and probably will not allow any increase in losartan.  I like to repeat her echo since has been about 3 years to see if her EF is stable or improved.  If it has declined, we may need to consider switching up her losartan to [facility] or possibly a biventricular pacer.  Follow-up with me annually or sooner as necessary.  Pixie Casino, MD, [facility], [place] Director of the Advanced Lipid Disorders &  Cardiovascular Risk Reduction Clinic Diplomate of the American Board of Clinical Lipidology Attending Cardiologist  Direct Dial: [phone]  Fax: [phone]  Website:  [email or web address] 03/04/2020, 4:26 PM

## 2020-03-09 DIAGNOSIS — M9901 Segmental and somatic dysfunction of cervical region: Secondary | ICD-10-CM | POA: Diagnosis not present

## 2020-03-09 DIAGNOSIS — M5412 Radiculopathy, cervical region: Secondary | ICD-10-CM | POA: Diagnosis not present

## 2020-03-11 DIAGNOSIS — M5412 Radiculopathy, cervical region: Secondary | ICD-10-CM | POA: Diagnosis not present

## 2020-03-11 DIAGNOSIS — M9901 Segmental and somatic dysfunction of cervical region: Secondary | ICD-10-CM | POA: Diagnosis not present

## 2020-03-12 ENCOUNTER — Other Ambulatory Visit: Payer: Self-pay | Admitting: Family Medicine

## 2020-03-12 ENCOUNTER — Other Ambulatory Visit: Payer: Self-pay | Admitting: Internal Medicine

## 2020-03-15 DIAGNOSIS — M5412 Radiculopathy, cervical region: Secondary | ICD-10-CM | POA: Diagnosis not present

## 2020-03-15 DIAGNOSIS — M9901 Segmental and somatic dysfunction of cervical region: Secondary | ICD-10-CM | POA: Diagnosis not present

## 2020-03-17 DIAGNOSIS — M5412 Radiculopathy, cervical region: Secondary | ICD-10-CM | POA: Diagnosis not present

## 2020-03-17 DIAGNOSIS — M9901 Segmental and somatic dysfunction of cervical region: Secondary | ICD-10-CM | POA: Diagnosis not present

## 2020-03-21 ENCOUNTER — Other Ambulatory Visit: Payer: Self-pay | Admitting: Internal Medicine

## 2020-03-22 DIAGNOSIS — M5412 Radiculopathy, cervical region: Secondary | ICD-10-CM | POA: Diagnosis not present

## 2020-03-22 DIAGNOSIS — M9901 Segmental and somatic dysfunction of cervical region: Secondary | ICD-10-CM | POA: Diagnosis not present

## 2020-03-24 ENCOUNTER — Other Ambulatory Visit: Payer: Self-pay

## 2020-03-24 ENCOUNTER — Ambulatory Visit
Admission: RE | Admit: 2020-03-24 | Discharge: 2020-03-24 | Disposition: A | Discharge status: Home / Self Care | Payer: Medicare HMO | Source: Ambulatory Visit | Attending: Oncology | Admitting: Oncology

## 2020-03-24 DIAGNOSIS — R921 Mammographic calcification found on diagnostic imaging of breast: Secondary | ICD-10-CM | POA: Diagnosis not present

## 2020-03-24 DIAGNOSIS — M5412 Radiculopathy, cervical region: Secondary | ICD-10-CM | POA: Diagnosis not present

## 2020-03-24 DIAGNOSIS — R922 Inconclusive mammogram: Secondary | ICD-10-CM | POA: Diagnosis not present

## 2020-03-24 DIAGNOSIS — M9901 Segmental and somatic dysfunction of cervical region: Secondary | ICD-10-CM | POA: Diagnosis not present

## 2020-03-29 ENCOUNTER — Ambulatory Visit (HOSPITAL_COMMUNITY): Payer: Medicare HMO | Attending: Cardiology

## 2020-03-29 ENCOUNTER — Other Ambulatory Visit: Payer: Self-pay

## 2020-03-29 DIAGNOSIS — I5022 Chronic systolic (congestive) heart failure: Secondary | ICD-10-CM | POA: Insufficient documentation

## 2020-03-29 DIAGNOSIS — I428 Other cardiomyopathies: Secondary | ICD-10-CM | POA: Diagnosis not present

## 2020-03-29 DIAGNOSIS — I447 Left bundle-branch block, unspecified: Secondary | ICD-10-CM

## 2020-03-29 DIAGNOSIS — M9901 Segmental and somatic dysfunction of cervical region: Secondary | ICD-10-CM | POA: Diagnosis not present

## 2020-03-29 DIAGNOSIS — M5412 Radiculopathy, cervical region: Secondary | ICD-10-CM | POA: Diagnosis not present

## 2020-03-29 LAB — ECHOCARDIOGRAM COMPLETE
Area-P 1/2: 7.99 cm2
S' Lateral: 3.6 cm

## 2020-03-31 DIAGNOSIS — M9901 Segmental and somatic dysfunction of cervical region: Secondary | ICD-10-CM | POA: Diagnosis not present

## 2020-03-31 DIAGNOSIS — M5412 Radiculopathy, cervical region: Secondary | ICD-10-CM | POA: Diagnosis not present

## 2020-04-03 ENCOUNTER — Other Ambulatory Visit: Payer: Self-pay | Admitting: Internal Medicine

## 2020-04-05 DIAGNOSIS — M9901 Segmental and somatic dysfunction of cervical region: Secondary | ICD-10-CM | POA: Diagnosis not present

## 2020-04-05 DIAGNOSIS — M5412 Radiculopathy, cervical region: Secondary | ICD-10-CM | POA: Diagnosis not present

## 2020-04-05 NOTE — Telephone Encounter (Signed)
Rx(s) sent to pharmacy electronically.  

## 2020-04-05 NOTE — Telephone Encounter (Signed)
Ok to switch ARB if losartan is not available - consider irbesartan 75 mg daily or valsartan 40 mg daily.   Dr Lemmie Evens

## 2020-04-06 ENCOUNTER — Other Ambulatory Visit: Payer: Self-pay | Admitting: Internal Medicine

## 2020-04-07 DIAGNOSIS — M5412 Radiculopathy, cervical region: Secondary | ICD-10-CM | POA: Diagnosis not present

## 2020-04-07 DIAGNOSIS — M9901 Segmental and somatic dysfunction of cervical region: Secondary | ICD-10-CM | POA: Diagnosis not present

## 2020-04-10 ENCOUNTER — Other Ambulatory Visit: Payer: Self-pay | Admitting: Family Medicine

## 2020-04-12 DIAGNOSIS — M5412 Radiculopathy, cervical region: Secondary | ICD-10-CM | POA: Diagnosis not present

## 2020-04-12 DIAGNOSIS — M9901 Segmental and somatic dysfunction of cervical region: Secondary | ICD-10-CM | POA: Diagnosis not present

## 2020-04-19 DIAGNOSIS — M9901 Segmental and somatic dysfunction of cervical region: Secondary | ICD-10-CM | POA: Diagnosis not present

## 2020-04-19 DIAGNOSIS — M5412 Radiculopathy, cervical region: Secondary | ICD-10-CM | POA: Diagnosis not present

## 2020-05-01 ENCOUNTER — Other Ambulatory Visit: Payer: Self-pay | Admitting: Internal Medicine

## 2020-05-06 ENCOUNTER — Other Ambulatory Visit: Payer: Self-pay | Admitting: Family Medicine

## 2020-05-06 DIAGNOSIS — G4700 Insomnia, unspecified: Secondary | ICD-10-CM | POA: Diagnosis not present

## 2020-05-06 DIAGNOSIS — E11620 Type 2 diabetes mellitus with diabetic dermatitis: Secondary | ICD-10-CM | POA: Diagnosis not present

## 2020-05-06 DIAGNOSIS — K219 Gastro-esophageal reflux disease without esophagitis: Secondary | ICD-10-CM | POA: Diagnosis not present

## 2020-05-06 DIAGNOSIS — I110 Hypertensive heart disease with heart failure: Secondary | ICD-10-CM | POA: Diagnosis not present

## 2020-05-06 DIAGNOSIS — I429 Cardiomyopathy, unspecified: Secondary | ICD-10-CM | POA: Diagnosis not present

## 2020-05-06 DIAGNOSIS — E785 Hyperlipidemia, unspecified: Secondary | ICD-10-CM | POA: Diagnosis not present

## 2020-05-06 DIAGNOSIS — M109 Gout, unspecified: Secondary | ICD-10-CM | POA: Diagnosis not present

## 2020-05-06 DIAGNOSIS — M1990 Unspecified osteoarthritis, unspecified site: Secondary | ICD-10-CM | POA: Diagnosis not present

## 2020-05-06 DIAGNOSIS — I509 Heart failure, unspecified: Secondary | ICD-10-CM | POA: Diagnosis not present

## 2020-05-10 ENCOUNTER — Other Ambulatory Visit: Payer: Self-pay | Admitting: Family Medicine

## 2020-06-02 ENCOUNTER — Inpatient Hospital Stay: Payer: Medicare HMO | Attending: Oncology

## 2020-06-02 ENCOUNTER — Other Ambulatory Visit: Payer: Self-pay

## 2020-06-02 DIAGNOSIS — D4989 Neoplasm of unspecified behavior of other specified sites: Secondary | ICD-10-CM | POA: Insufficient documentation

## 2020-06-02 LAB — CBC WITH DIFFERENTIAL (CANCER CENTER ONLY)
Abs Immature Granulocytes: 0.03 K/uL (ref 0.00–0.07)
Basophils Absolute: 0 K/uL (ref 0.0–0.1)
Basophils Relative: 0 %
Eosinophils Absolute: 0.2 K/uL (ref 0.0–0.5)
Eosinophils Relative: 3 %
HCT: 33 % — ABNORMAL LOW (ref 36.0–46.0)
Hemoglobin: 10.5 g/dL — ABNORMAL LOW (ref 12.0–15.0)
Immature Granulocytes: 0 %
Lymphocytes Relative: 24 %
Lymphs Abs: 1.8 K/uL (ref 0.7–4.0)
MCH: 31.4 pg (ref 26.0–34.0)
MCHC: 31.8 g/dL (ref 30.0–36.0)
MCV: 98.8 fL (ref 80.0–100.0)
Monocytes Absolute: 0.6 K/uL (ref 0.1–1.0)
Monocytes Relative: 7 %
Neutro Abs: 4.9 K/uL (ref 1.7–7.7)
Neutrophils Relative %: 66 %
Platelet Count: 164 K/uL (ref 150–400)
RBC: 3.34 MIL/uL — ABNORMAL LOW (ref 3.87–5.11)
RDW: 15.1 % (ref 11.5–15.5)
WBC Count: 7.5 K/uL (ref 4.0–10.5)
nRBC: 0 % (ref 0.0–0.2)

## 2020-06-02 LAB — CMP (CANCER CENTER ONLY)
ALT: 29 U/L (ref 0–44)
AST: 40 U/L (ref 15–41)
Albumin: 3.6 g/dL (ref 3.5–5.0)
Alkaline Phosphatase: 63 U/L (ref 38–126)
Anion gap: 10 (ref 5–15)
BUN: 34 mg/dL — ABNORMAL HIGH (ref 8–23)
CO2: 23 mmol/L (ref 22–32)
Calcium: 9.7 mg/dL (ref 8.9–10.3)
Chloride: 107 mmol/L (ref 98–111)
Creatinine: 1.58 mg/dL — ABNORMAL HIGH (ref 0.44–1.00)
GFR, Estimated: 34 mL/min — ABNORMAL LOW
Glucose, Bld: 157 mg/dL — ABNORMAL HIGH (ref 70–99)
Potassium: 4.6 mmol/L (ref 3.5–5.1)
Sodium: 140 mmol/L (ref 135–145)
Total Bilirubin: 0.8 mg/dL (ref 0.3–1.2)
Total Protein: 7.1 g/dL (ref 6.5–8.1)

## 2020-06-03 ENCOUNTER — Encounter (HOSPITAL_COMMUNITY): Payer: Self-pay

## 2020-06-03 ENCOUNTER — Ambulatory Visit (HOSPITAL_COMMUNITY)
Admission: RE | Admit: 2020-06-03 | Discharge: 2020-06-03 | Disposition: A | Discharge status: Home / Self Care | Payer: Medicare HMO | Source: Ambulatory Visit | Attending: Oncology | Admitting: Oncology

## 2020-06-03 DIAGNOSIS — Z905 Acquired absence of kidney: Secondary | ICD-10-CM | POA: Diagnosis not present

## 2020-06-03 DIAGNOSIS — C642 Malignant neoplasm of left kidney, except renal pelvis: Secondary | ICD-10-CM | POA: Diagnosis not present

## 2020-06-03 DIAGNOSIS — K7689 Other specified diseases of liver: Secondary | ICD-10-CM | POA: Diagnosis not present

## 2020-06-03 DIAGNOSIS — K7460 Unspecified cirrhosis of liver: Secondary | ICD-10-CM | POA: Insufficient documentation

## 2020-06-03 DIAGNOSIS — K5730 Diverticulosis of large intestine without perforation or abscess without bleeding: Secondary | ICD-10-CM | POA: Insufficient documentation

## 2020-06-03 DIAGNOSIS — I700 Atherosclerosis of aorta: Secondary | ICD-10-CM | POA: Insufficient documentation

## 2020-06-03 DIAGNOSIS — R918 Other nonspecific abnormal finding of lung field: Secondary | ICD-10-CM | POA: Diagnosis not present

## 2020-06-03 DIAGNOSIS — D4989 Neoplasm of unspecified behavior of other specified sites: Secondary | ICD-10-CM

## 2020-06-03 DIAGNOSIS — C7800 Secondary malignant neoplasm of unspecified lung: Secondary | ICD-10-CM | POA: Diagnosis not present

## 2020-06-03 MED ORDER — IOHEXOL 300 MG/ML  SOLN
75.0000 mL | Freq: Once | INTRAMUSCULAR | Status: AC | PRN
  Administered 2020-06-03: 75 mL via INTRAVENOUS

## 2020-06-03 MED ORDER — SODIUM CHLORIDE (PF) 0.9 % IJ SOLN
INTRAMUSCULAR | Status: AC
  Filled 2020-06-03: qty 50

## 2020-06-03 MED ORDER — IOHEXOL 300 MG/ML  SOLN: 100.0000 mL | Freq: Once | INTRAMUSCULAR | Status: DC | PRN

## 2020-06-05 DIAGNOSIS — R051 Acute cough: Secondary | ICD-10-CM | POA: Diagnosis not present

## 2020-06-09 ENCOUNTER — Telehealth: Payer: Self-pay | Admitting: *Deleted

## 2020-06-09 ENCOUNTER — Inpatient Hospital Stay (HOSPITAL_BASED_OUTPATIENT_CLINIC_OR_DEPARTMENT_OTHER): Payer: Medicare HMO | Admitting: Oncology

## 2020-06-09 DIAGNOSIS — D4989 Neoplasm of unspecified behavior of other specified sites: Secondary | ICD-10-CM

## 2020-06-09 DIAGNOSIS — R918 Other nonspecific abnormal finding of lung field: Secondary | ICD-10-CM

## 2020-06-09 NOTE — Progress Notes (Signed)
Hematology and Oncology Follow Up for Telemedicine Visits  Amanda Crawford [id number] 09/23/1945 75 y.o. 06/09/2020 11:19 AM Amanda Crawford, Amanda Crawford, Amanda Levan, MD   I connected with Amanda Crawford on 06/09/20 at 10:00 AM EDT by telephone visit and verified that I am speaking with the correct person using two identifiers.   I discussed the limitations, risks, security and privacy concerns of performing an evaluation and management service by telemedicine and the availability of in-person appointments. I also discussed with the patient that there may be a patient responsible charge related to this service. The patient expressed understanding and agreed to proceed.  Other persons participating in the visit and their role in the encounter: None  Patient's location: Home Provider's location: Office    Principle Diagnosis:  75 year old womanwith Stage IV clear-cell  renal cell carcinoma diagnosed with a left kidney mass in 2001.  She developed recurrent pulmonary involvement and underwent resection multiple occasions.    Secondary diagnosis: Right renal neoplasm diagnosed in July 2020.  She is status post cryoablation without any evidence of relapse.    Prior Therapy:  She is status post left nephrectomy in 2001. She developed a left lingular mass, status post VATS procedure. Pathology revealed a recurrent renal cell carcinoma resected in October 2008.  She developed relapse disease in 2014 and subsequently underwent a VATS and left upper lobe wedge resection of a tumor done on 05/09/2012. Pathology confirmed another relapse. She is status post cryoablation of a right renal neoplasm on August 21, 2018.  Current therapy: Active surveillance.  Interim History: Amanda Crawford reports feeling well at this time.  She did develop respiratory complaints including cough and shortness of breath and was treated with prednisone as well as Z-Pak for presumed bronchitis and pneumonia.  Her symptoms has  resolved at this time.  She denies any chest pain, difficulty breathing or cough.  She denies any constitutional symptoms.      Medications: I have reviewed the patient's current medications.  Current Outpatient Medications  Medication Sig Dispense Refill  . allopurinol (ZYLOPRIM) 300 MG tablet TAKE 1 TABLET BY MOUTH EVERY DAY 30 tablet 4  . aspirin EC 81 MG tablet Take 81 mg by mouth daily.    Marland Kitchen atorvastatin (LIPITOR) 20 MG tablet Take 1 tablet (20 mg total) by mouth daily. 90 tablet 1  . Calcium Carb-Cholecalciferol (CALCIUM 600 + D PO) Take 1 tablet by mouth daily.    . carvedilol (COREG) 6.25 MG tablet TAKE 1 TABLET (6.25 MG TOTAL) BY MOUTH 2 (TWO) TIMES DAILY. PT NEEDS AN OV FOR FUTURE REFILLS 90 tablet 3  . diphenhydramine-acetaminophen (TYLENOL PM) 25-500 MG TABS Take 1 tablet by mouth at bedtime as needed (sleep).     . furosemide (LASIX) 40 MG tablet TAKE 1 TABLET BY MOUTH EVERY DAY 90 tablet 3  . glimepiride (AMARYL) 2 MG tablet Take 1 tablet (2 mg total) by mouth daily with breakfast. 30 tablet 0  . hyoscyamine (LEVSIN SL) 0.125 MG SL tablet Place 1 tablet (0.125 mg total) under the tongue every 6 (six) hours as needed. 30 tablet 0  . irbesartan (AVAPRO) 75 MG tablet Take 1 tablet (75 mg total) by mouth daily. 90 tablet 1  . meclizine (ANTIVERT) 12.5 MG tablet Take 1 tablet (12.5 mg total) by mouth 3 (three) times daily as needed for dizziness. As needed for   Dizziness or nausea 30 tablet 1  . metFORMIN (GLUCOPHAGE) 500 MG tablet TAKE 1 TABLET BY MOUTH 2  TIMES DAILY WITH A MEAL. 60 tablet 0  . nystatin cream (MYCOSTATIN) Apply 1 application topically 2 (two) times daily. 30 g 1  . OneTouch Delica Lancets 39J MISC USE AS DIRECTED TO TEST DAILY 100 each 2  . ONETOUCH VERIO test strip CEHCK BLOOD SUGAR DAILY AS NEEDED 100 strip 12  . Vitamin D, Ergocalciferol, (DRISDOL) 1.25 MG (50000 UNIT) CAPS capsule TAKE 1 CAPSULE (50,000 UNITS TOTAL) BY MOUTH EVERY 7 (SEVEN) DAYS. 12 capsule 1    No current facility-administered medications for this visit.        Lab Results: Lab Results  Component Value Date   WBC 7.5 06/02/2020   HGB 10.5 (L) 06/02/2020   HCT 33.0 (L) 06/02/2020   MCV 98.8 06/02/2020   PLT 164 06/02/2020     Chemistry      Component Value Date/Time   NA 140 06/02/2020 0954   NA 139 08/23/2017 0933   NA 140 11/01/2016 0818   K 4.6 06/02/2020 0954   K 4.4 11/01/2016 0818   CL 107 06/02/2020 0954   CL 107 04/16/2012 0923   CO2 23 06/02/2020 0954   CO2 26 11/01/2016 0818   BUN 34 (H) 06/02/2020 0954   BUN 24 08/23/2017 0933   BUN 17.9 11/01/2016 0818   CREATININE 1.58 (H) 06/02/2020 0954   CREATININE 1.1 11/01/2016 0818   GLU 151 (H) 09/30/2019 1014      Component Value Date/Time   CALCIUM 9.7 06/02/2020 0954   CALCIUM 10.3 11/01/2016 0818   ALKPHOS 63 06/02/2020 0954   ALKPHOS 72 11/01/2016 0818   AST 40 06/02/2020 0954   AST 75 (H) 11/01/2016 0818   ALT 29 06/02/2020 0954   ALT 75 (H) 11/01/2016 0818   BILITOT 0.8 06/02/2020 0954   BILITOT 0.78 11/01/2016 0818       Radiological Studies:    IMPRESSION: 1. A cluster of several nodules is identified in the posterior right costophrenic sulcus. Individual nodules range in size from 5-6 mm up to 11 mm. While metastatic disease is a concern given the patient's cancer history, the clustering of these nodules in a single location is more suggestive of infectious/inflammatory etiology. Close follow-up recommended and repeat CT chest in 3 months after therapy suggested to re-evaluate. 2. Status post left nephrectomy without definite evidence for local recurrence. There is a new thin wispy focus of low-density soft tissue attenuation in the inferior aspect of the nephrectomy bed, indeterminate. Close attention on follow-up recommended. 3. Stable 2 cm left lateral omental nodule described previously. Continued attention on follow-up recommended. 4. No definite evidence for  metastatic disease in the abdomen/pelvis. 5. Cirrhotic liver morphology. 6. Left colonic diverticulosis without diverticulitis. 7. Aortic Atherosclerosis (ICD10-I70.0).  Impression and Plan:  75 year old woman with:    1.Stage IV clear-cell renal cell carcinoma of the left side with pulmonary involvement diagnosed originally in 2001 with recurrence as outlined above.  Her disease status was updated at this time including repeat imaging studies completed and May 2022.  PET scan showed no evidence of clear-cut metastatic disease although she does have a cluster of pulmonary nodules noted in the posterior right lung.  She is getting lower respiratory infection could represent these findings but malignancy cannot be ruled out.  At this time I recommended repeat imaging studies in 3 months to ensure stability or resolution of these nodules.  If she has indeed recurrent disease these nodules will grow and treatment will be considered at that time.  This will  be in the form of local ablation possible systemic therapy.  2.  Right kidney tumor:  Imaging studies showed no evidence of recurrent tumor at this time.  3. Follow-up: In  3 months for repeat imaging studies of the chest.     I discussed the assessment and treatment plan with the patient. The patient was provided an opportunity to ask questions and all were answered. The patient agreed with the plan and demonstrated an understanding of the instructions.   The patient was advised to call back or seek an in-person evaluation if the symptoms worsen or if the condition fails to improve as anticipated.  I provided 20 minutes of non face-to-face telephone visit time during this encounter.  Time was dedicated to reviewing imaging studies, discussing differential diagnosis and management choices for the future.  Zola Button, MD 06/09/2020 11:19 AM

## 2020-06-09 NOTE — Telephone Encounter (Addendum)
Patient did not come to appt 06/09/20 with Dr. Alen Blew.  Contacted patient - LVM to call office to reschedule appt Informed Dr. Alen Blew - per Dr. Alen Blew, change today's appt to phone appt. Contacted patient and she is inagreement

## 2020-06-12 ENCOUNTER — Other Ambulatory Visit: Payer: Self-pay | Admitting: Family Medicine

## 2020-06-30 ENCOUNTER — Other Ambulatory Visit: Payer: Self-pay | Admitting: Family Medicine

## 2020-07-05 ENCOUNTER — Other Ambulatory Visit: Payer: Self-pay | Admitting: Family Medicine

## 2020-07-05 ENCOUNTER — Ambulatory Visit (INDEPENDENT_AMBULATORY_CARE_PROVIDER_SITE_OTHER): Payer: Medicare HMO

## 2020-07-05 ENCOUNTER — Telehealth: Payer: Self-pay

## 2020-07-05 VITALS — Ht 64.0 in | Wt 219.0 lb

## 2020-07-05 DIAGNOSIS — Z0000 Encounter for general adult medical examination without abnormal findings: Secondary | ICD-10-CM

## 2020-07-05 DIAGNOSIS — Z780 Asymptomatic menopausal state: Secondary | ICD-10-CM | POA: Diagnosis not present

## 2020-07-05 MED ORDER — VITAMIN D (ERGOCALCIFEROL) 1.25 MG (50000 UNIT) PO CAPS: 50000.0000 [IU] | ORAL_CAPSULE | ORAL | 1 refills | Status: DC

## 2020-07-05 NOTE — Progress Notes (Signed)
Subjective:   Amanda Crawford is a 75 y.o. female who presents for Medicare Annual (Subsequent) preventive examination.   I connected with Theodosia today by telephone and verified that I am speaking with the correct person using two identifiers. Location patient: home Location provider: work Persons participating in the virtual visit: patient, Marine scientist.    I discussed the limitations, risks, security and privacy concerns of performing an evaluation and management service by telephone and the availability of in person appointments. I also discussed with the patient that there may be a patient responsible charge related to this service. The patient expressed understanding and verbally consented to this telephonic visit.    Interactive audio and video telecommunications were attempted between this provider and patient, however failed, due to patient having technical difficulties OR patient did not have access to video capability.  We continued and completed visit with audio only.  Some vital signs may be absent or patient reported.   Time Spent with patient on telephone encounter: 30 minutes  Review of Systems     Cardiac Risk Factors include: advanced age (>32men, >97 women);diabetes mellitus;dyslipidemia;obesity (BMI >30kg/m2);sedentary lifestyle     Objective:    Today's Vitals   07/05/20 0817  Weight: 219 lb (99.3 kg)  Height: 5\' 4"  (1.626 m)   Body mass index is 37.59 kg/m.  Advanced Directives 07/05/2020 02/13/2019 08/21/2018 08/21/2018 08/21/2018 08/19/2018 07/27/2016  Does Patient Have a Medical Advance Directive? Yes Yes Yes Yes Yes Yes Yes  Type of Advance Directive Living will [place];Living will Living will Living will Living will Living will [place];Living will  Does patient want to make changes to medical advance directive? - No - Patient declined - No - Patient declined No - Patient declined - No - Patient declined  Copy of [place] in Chart? - Yes - validated most recent copy scanned in chart (See row information) - - - - No - copy requested  Pre-existing out of facility DNR order (yellow form or pink MOST form) - - - - - - -    Current Medications (verified) Outpatient Encounter Medications as of 07/05/2020  Medication Sig  . allopurinol (ZYLOPRIM) 300 MG tablet TAKE 1 TABLET BY MOUTH EVERY DAY  . aspirin EC 81 MG tablet Take 81 mg by mouth daily.  Marland Kitchen atorvastatin (LIPITOR) 20 MG tablet Take 1 tablet (20 mg total) by mouth daily.  . carvedilol (COREG) 6.25 MG tablet TAKE 1 TABLET (6.25 MG TOTAL) BY MOUTH 2 (TWO) TIMES DAILY. PT NEEDS AN OV FOR FUTURE REFILLS  . diphenhydramine-acetaminophen (TYLENOL PM) 25-500 MG TABS Take 1 tablet by mouth at bedtime as needed (sleep).   . furosemide (LASIX) 40 MG tablet TAKE 1 TABLET BY MOUTH EVERY DAY  . glimepiride (AMARYL) 2 MG tablet TAKE 1 TABLET BY MOUTH DAILY WITH BREAKFAST  . hyoscyamine (LEVSIN SL) 0.125 MG SL tablet Place 1 tablet (0.125 mg total) under the tongue every 6 (six) hours as needed.  . irbesartan (AVAPRO) 75 MG tablet Take 1 tablet (75 mg total) by mouth daily.  . meclizine (ANTIVERT) 12.5 MG tablet Take 1 tablet (12.5 mg total) by mouth 3 (three) times daily as needed for dizziness. As needed for   Dizziness or nausea  . metFORMIN (GLUCOPHAGE) 500 MG tablet TAKE 1 TABLET BY MOUTH 2 TIMES DAILY WITH A MEAL.  Marland Kitchen nystatin cream (MYCOSTATIN) Apply 1 application topically 2 (two) times daily.  Glory Rosebush Delica Lancets  33G MISC USE AS DIRECTED TO TEST DAILY  . ONETOUCH VERIO test strip CEHCK BLOOD SUGAR DAILY AS NEEDED  . Vitamin D, Ergocalciferol, (DRISDOL) 1.25 MG (50000 UNIT) CAPS capsule TAKE 1 CAPSULE (50,000 UNITS TOTAL) BY MOUTH EVERY 7 (SEVEN) DAYS.  . Calcium Carb-Cholecalciferol (CALCIUM 600 + D PO) Take 1 tablet by mouth daily. (Patient not taking: Reported on 07/05/2020)  . [DISCONTINUED] glyBURIDE-metformin (GLUCOVANCE) 1.25-250 MG tablet  Take 1 tablet by mouth 2 (two) times daily with a meal.   No facility-administered encounter medications on file as of 07/05/2020.    Allergies (verified) Patient has no known allergies.   History: Past Medical History:  Diagnosis Date  . Allergic state 03/12/2015  . Arthritis   . Colon polyp 11/19/2014  . Dermatitis 03/12/2015  . Diarrhea 06/29/2016  . Diverticulitis   . DM (diabetes mellitus), type 2 ([place]) 06/29/2016  . GERD (gastroesophageal reflux disease)    occ  . Gout 03/12/2015  . H/O measles   . H/O mumps   . Headache(784.0)    migraines  . History of chicken pox 11/23/2014  . Hyperglycemia 06/29/2016  . Lung cancer ([place])   . Migraine 11/23/2014  . Obesity 11/23/2014  . Pneumonia    child  . Preventative health care 09/12/2015  . Renal cell cancer ([place])    renal cell ca dx 9/01 and 8/08;  . Renal insufficiency   . Shortness of breath    occ  . Skin lesion of breast 03/12/2015   Past Surgical History:  Procedure Laterality Date  . ABDOMINAL HYSTERECTOMY    . APPENDECTOMY    . CARDIAC CATHETERIZATION     yrs ago neg  . CHOLECYSTECTOMY    . COLONOSCOPY  2018  . ESOPHAGOGASTRODUODENOSCOPY     years ago  . IR RADIOLOGIST EVAL & MGMT  07/17/2018  . IR RADIOLOGIST EVAL & MGMT  09/12/2018  . IR RADIOLOGIST EVAL & MGMT  06/18/2019  . IR RADIOLOGIST EVAL & MGMT  01/13/2020  . KIDNEY SURGERY  2001   Removed of left kidney   . LUNG CANCER SURGERY Left 08  . RADIOLOGY WITH ANESTHESIA N/A 08/21/2018   Procedure: CT WITH ANESTHESIA RENAL CRYOABLATION;  Surgeon: Arne Cleveland, MD;  Location: [facility];  Service: Radiology;  Laterality: N/A;  . RENAL MASS EXCISION Left 01  . RIGHT/LEFT HEART CATH AND CORONARY ANGIOGRAPHY N/A 07/27/2016   Procedure: Right/Left Heart Cath and Coronary Angiography;  Surgeon: Leonie Man, MD;  Location: [facility];  Service: Cardiovascular;  Laterality: N/A;  . THORACOTOMY Left 05/09/2012   Procedure: THORACOTOMY MAJOR;  Surgeon: Gaye Pollack, MD;  Location: [facility];  Service: Thoracic;  Laterality: Left;  Marland Kitchen VIDEO BRONCHOSCOPY N/A 05/09/2012   Procedure: VIDEO BRONCHOSCOPY;  Surgeon: Gaye Pollack, MD;  Location: [facility];  Service: Thoracic;  Laterality: N/A;  . WEDGE RESECTION Left 05/09/2012   Procedure: LEFT UPPER LOBE WEDGE RESECTION;  Surgeon: Gaye Pollack, MD;  Location: [facility];  Service: Thoracic;  Laterality: Left;   Family History  Problem Relation Age of Onset  . Heart failure Father   . Diabetes Father   . Hyperlipidemia Father   . Heart disease Father   . Hypertension Father   . Asthma Brother   . Asthma Daughter   . Cancer Paternal Grandmother   . Hearing loss Paternal Grandfather   . Stroke Paternal Grandfather   . Alcohol abuse Maternal Aunt   . Colon cancer Paternal Aunt  Social History   Socioeconomic History  . Marital status: Married    Spouse name: Not on file  . Number of children: Not on file  . Years of education: Not on file  . Highest education level: Not on file  Occupational History  . Occupation: retired  Tobacco Use  . Smoking status: Former Smoker    Packs/day: 0.50    Years: 35.00    Pack years: 17.50    Types: Cigarettes    Quit date: 05/08/2006    Years since quitting: 14.1  . Smokeless tobacco: Never Used  Vaping Use  . Vaping Use: Never used  Substance and Sexual Activity  . Alcohol use: Yes    Comment: seldomly  . Drug use: No  . Sexual activity: Not on file    Comment: lives with husband, no dietary restrictions.   Other Topics Concern  . Not on file  Social History Narrative  . Not on file   Social Determinants of Health   Financial Resource Strain: Low Risk   . Difficulty of Paying Living Expenses: Not hard at all  Food Insecurity: No Food Insecurity  . Worried About Charity fundraiser in the Last Year: Never true  . Ran Out of Food in the Last Year: Never true  Transportation Needs: No Transportation Needs  . Lack of Transportation (Medical): No  .  Lack of Transportation (Non-Medical): No  Physical Activity: Inactive  . Days of Exercise per Week: 0 days  . Minutes of Exercise per Session: 0 min  Stress: No Stress Concern Present  . Feeling of Stress : Only a little  Social Connections: Moderately Integrated  . Frequency of Communication with Friends and Family: More than three times a week  . Frequency of Social Gatherings with Friends and Family: Once a week  . Attends Religious Services: More than 4 times per year  . Active Member of Clubs or Organizations: No  . Attends Archivist Meetings: Never  . Marital Status: Married    Tobacco Counseling Counseling given: Not Answered   Clinical Intake:  Pre-visit preparation completed: Yes  Pain : No/denies pain     Nutritional Status: BMI > 30  Obese Nutritional Risks: None Diabetes: Yes CBG done?: No Did pt. bring in CBG monitor from home?: No (phone visit)  How often do you need to have someone help you when you read instructions, pamphlets, or other written materials from your doctor or pharmacy?: 1 - Never  Diabetes:  Is the patient diabetic?  Yes  If diabetic, was a CBG obtained today?  No  Did the patient bring in their glucometer from home?  No phone visit How often do you monitor your CBG's? daily.   Financial Strains and Diabetes Management:  Are you having any financial strains with the device, your supplies or your medication? No .  Does the patient want to be seen by Chronic Care Management for management of their diabetes?  No  Would the patient like to be referred to a Nutritionist or for Diabetic Management?  No   Diabetic Exams:  Diabetic Eye Exam: . Overdue for diabetic eye exam. Pt has been advised about the importance in completing this exam.Patient has an appt scheduled for this month.  Diabetic Foot Exam:  Pt has been advised about the importance in completing this exam. To be completed by PCP     Interpreter Needed?:  No  Information entered by :: Caroleen Hamman LPN   Activities of  Daily Living In your present state of health, do you have any difficulty performing the following activities: 07/05/2020  Hearing? N  Vision? N  Difficulty concentrating or making decisions? N  Walking or climbing stairs? N  Dressing or bathing? N  Doing errands, shopping? N  Preparing Food and eating ? N  Using the Toilet? N  In the past six months, have you accidently leaked urine? Y  Do you have problems with loss of bowel control? Y  Managing your Medications? N  Managing your Finances? N  Housekeeping or managing your Housekeeping? N  Some recent data might be hidden    Patient Care Team: [name], MD as PCP - General (Family Medicine) Wyatt Portela, MD (Hematology and Oncology) Gaye Pollack, MD as Attending Physician (Cardiothoracic Surgery)  Indicate any recent Medical Services you may have received from other than [facility] providers in the past year (date may be approximate).     Assessment:   This is a routine wellness examination for [place].  Hearing/Vision screen  Hearing Screening   125Hz  250Hz  500Hz  1000Hz  2000Hz  3000Hz  4000Hz  6000Hz  8000Hz   Right ear:           Left ear:           Comments: No issues  Vision Screening Comments: Wears glasses Last eye exam-2021  Dietary issues and exercise activities discussed: Current Exercise Habits: The patient does not participate in regular exercise at present, Exercise limited by: None identified  Goals Addressed            This Visit's Progress   . Increase physical activity   Not on track     Depression Screen PHQ 2/9 Scores 07/05/2020 08/05/2019 02/13/2019 06/07/2017 03/02/2016 02/18/2015 10/09/2013  PHQ - 2 Score 1 0 0 0 0 0 0    Fall Risk Fall Risk  07/05/2020 08/05/2019 02/13/2019 06/07/2017 03/02/2016  Falls in the past year? 0 0 0 No No  Number falls in past yr: 0 - 0 - -  Injury with Fall? 0 - 0 - -  Follow up Falls prevention discussed -  Falls prevention discussed;Education provided - -    FALL RISK PREVENTION PERTAINING TO THE HOME:  Any stairs in or around the home? Yes  If so, are there any without handrails? No  Home free of loose throw rugs in walkways, pet beds, electrical cords, etc? Yes  Adequate lighting in your home to reduce risk of falls? Yes   ASSISTIVE DEVICES UTILIZED TO PREVENT FALLS:  Life alert? No  Use of a cane, walker or w/c? No  Grab bars in the bathroom? Yes  Shower chair or bench in shower? No  Elevated toilet seat or a handicapped toilet? No   TIMED UP AND GO:  Was the test performed? No . phone visit   Cognitive Function:Normal cognitive status assessed by  this Nurse Health Advisor. No abnormalities found.          Immunizations Immunization History  Administered Date(s) Administered  . Influenza, High Dose Seasonal PF 10/27/2016, 10/08/2017  . Influenza, Seasonal, Injecte, Preservative Fre 11/21/2014  . Influenza-Unspecified 11/21/2014, 10/31/2018  . PFIZER(Purple Top)SARS-COV-2 Vaccination 03/14/2019, 04/08/2019, 10/01/2019  . Pneumococcal Conjugate-13 03/12/2015  . Pneumococcal Polysaccharide-23 10/23/2016  . Td 08/31/2015  . Tdap 08/31/2015  . Zoster Recombinat (Shingrix) 12/14/2017, 04/29/2018    TDAP status: Up to date  Flu Vaccine status: Up to date  Pneumococcal vaccine status: Up to date  Covid-19 vaccine status: Completed vaccines  Qualifies for Shingles Vaccine? No   Zostavax completed No   Shingrix Completed?: Yes  Screening Tests Health Maintenance  Topic Date Due  . Pneumococcal Vaccine 41-59 Years old (1 of 4 - PCV13) Never done  . FOOT EXAM  Never done  . OPHTHALMOLOGY EXAM  07/18/2019  . COVID-19 Vaccine (4 - Booster for [place] series) 12/31/2019  . HEMOGLOBIN A1C  02/05/2020  . INFLUENZA VACCINE  08/30/2020  . TETANUS/TDAP  08/30/2025  . DEXA SCAN  Completed  . Hepatitis C Screening  Completed  . PNA vac Low Risk Adult  Completed  . Zoster  Vaccines- Shingrix  Completed  . HPV VACCINES  Aged Out    Health Maintenance  Health Maintenance Due  Topic Date Due  . Pneumococcal Vaccine 55-19 Years old (1 of 4 - PCV13) Never done  . FOOT EXAM  Never done  . OPHTHALMOLOGY EXAM  07/18/2019  . COVID-19 Vaccine (4 - Booster for [place] series) 12/31/2019  . HEMOGLOBIN A1C  02/05/2020    Colorectal cancer screening: No longer required.   Mammogram status: Completed Bilateral 03/24/2020. Repeat every year  Bone Density status: Ordered today. Pt provided with contact info and advised to call to schedule appt.  Lung Cancer Screening: (Low Dose CT Chest recommended if Age 77-80 years, 30 pack-year currently smoking OR have quit w/in 15years.) does not qualify.    Additional Screening:  Hepatitis C Screening:Completed 09/30/2019  Vision Screening: Recommended annual ophthalmology exams for early detection of glaucoma and other disorders of the eye. Is the patient up to date with their annual eye exam?  No  Who is the provider or what is the name of the office in which the patient attends annual eye exams? Dr. Kathlen Mody Patient has an appt scheduled for this month.  Dental Screening: Recommended annual dental exams for proper oral hygiene  Community Resource Referral / Chronic Care Management: CRR required this visit?  No   CCM required this visit?  No      Plan:     I have personally reviewed and noted the following in the patient's chart:   . Medical and social history . Use of alcohol, tobacco or illicit drugs  . Current medications and supplements including opioid prescriptions.  . Functional ability and status . Nutritional status . Physical activity . Advanced directives . List of other physicians . Hospitalizations, surgeries, and ER visits in previous 12 months . Vitals . Screenings to include cognitive, depression, and falls . Referrals and appointments  In addition, I have reviewed and discussed with patient  certain preventive protocols, quality metrics, and best practice recommendations. A written personalized care plan for preventive services as well as general preventive health recommendations were provided to patient.   Due to this being a telephonic visit, the after visit summary with patients personalized plan was offered to patient via mail or my-chart.  Per request, copy of avs mailed to patient.    Marta Antu, LPN   07/31/1655  Nurse Health Advisor  Nurse Notes: None

## 2020-07-05 NOTE — Patient Instructions (Signed)
Amanda Crawford , Thank you for taking time to complete your Medicare Wellness Visit. I appreciate your ongoing commitment to your health goals. Please review the following plan we discussed and let me know if I can assist you in the future.   Screening recommendations/referrals: Colonoscopy: No longer required Mammogram: Completed 03/24/2020-Due 03/24/2021 Bone Density: Ordered today. Someone will be calling you to schedule. Recommended yearly ophthalmology/optometry visit for glaucoma screening and checkup Recommended yearly dental visit for hygiene and checkup  Vaccinations: Influenza vaccine: Up to date Pneumococcal vaccine: Completed vaccines Tdap vaccine: Up to date -Due-08/30/2025 Shingles vaccine: Completed vaccines   Covid-19:Up to date  Advanced directives: Copy in chart  Conditions/risks identified: See problem list  Next appointment: Follow up in one year for your annual wellness visit 07/07/2021 @ 8:20   Preventive Care 36 Years and Older, Female Preventive care refers to lifestyle choices and visits with your health care provider that can promote health and wellness. What does preventive care include?  A yearly physical exam. This is also called an annual well check.  Dental exams once or twice a year.  Routine eye exams. Ask your health care provider how often you should have your eyes checked.  Personal lifestyle choices, including:  Daily care of your teeth and gums.  Regular physical activity.  Eating a healthy diet.  Avoiding tobacco and drug use.  Limiting alcohol use.  Practicing safe sex.  Taking low-dose aspirin every day.  Taking vitamin and mineral supplements as recommended by your health care provider. What happens during an annual well check? The services and screenings done by your health care provider during your annual well check will depend on your age, overall health, lifestyle risk factors, and family history of disease. Counseling  Your  health care provider may ask you questions about your:  Alcohol use.  Tobacco use.  Drug use.  Emotional well-being.  Home and relationship well-being.  Sexual activity.  Eating habits.  History of falls.  Memory and ability to understand (cognition).  Work and work Statistician.  Reproductive health. Screening  You may have the following tests or measurements:  Height, weight, and BMI.  Blood pressure.  Lipid and cholesterol levels. These may be checked every 5 years, or more frequently if you are over 65 years old.  Skin check.  Lung cancer screening. You may have this screening every year starting at age 23 if you have a 30-pack-year history of smoking and currently smoke or have quit within the past 15 years.  Fecal occult blood test (FOBT) of the stool. You may have this test every year starting at age 65.  Flexible sigmoidoscopy or colonoscopy. You may have a sigmoidoscopy every 5 years or a colonoscopy every 10 years starting at age 86.  Hepatitis C blood test.  Hepatitis B blood test.  Sexually transmitted disease (STD) testing.  Diabetes screening. This is done by checking your blood sugar (glucose) after you have not eaten for a while (fasting). You may have this done every 1-3 years.  Bone density scan. This is done to screen for osteoporosis. You may have this done starting at age 32.  Mammogram. This may be done every 1-2 years. Talk to your health care provider about how often you should have regular mammograms. Talk with your health care provider about your test results, treatment options, and if necessary, the need for more tests. Vaccines  Your health care provider may recommend certain vaccines, such as:  Influenza vaccine. This is recommended  every year.  Tetanus, diphtheria, and acellular pertussis (Tdap, Td) vaccine. You may need a Td booster every 10 years.  Zoster vaccine. You may need this after age 46.  Pneumococcal 13-valent  conjugate (PCV13) vaccine. One dose is recommended after age 10.  Pneumococcal polysaccharide (PPSV23) vaccine. One dose is recommended after age 68. Talk to your health care provider about which screenings and vaccines you need and how often you need them. This information is not intended to replace advice given to you by your health care provider. Make sure you discuss any questions you have with your health care provider. Document Released: 02/12/2015 Document Revised: 10/06/2015 Document Reviewed: 11/17/2014 Elsevier Interactive Patient Education  2017 [place] Prevention in the Home Falls can cause injuries. They can happen to people of all ages. There are many things you can do to make your home safe and to help prevent falls. What can I do on the outside of my home?  Regularly fix the edges of walkways and driveways and fix any cracks.  Remove anything that might make you trip as you walk through a door, such as a raised step or threshold.  Trim any bushes or trees on the path to your home.  Use bright outdoor lighting.  Clear any walking paths of anything that might make someone trip, such as rocks or tools.  Regularly check to see if handrails are loose or broken. Make sure that both sides of any steps have handrails.  Any raised decks and porches should have guardrails on the edges.  Have any leaves, snow, or ice cleared regularly.  Use sand or salt on walking paths during winter.  Clean up any spills in your garage right away. This includes oil or grease spills. What can I do in the bathroom?  Use night lights.  Install grab bars by the toilet and in the tub and shower. Do not use towel bars as grab bars.  Use non-skid mats or decals in the tub or shower.  If you need to sit down in the shower, use a plastic, non-slip stool.  Keep the floor dry. Clean up any water that spills on the floor as soon as it happens.  Remove soap buildup in the tub or  shower regularly.  Attach bath mats securely with double-sided non-slip rug tape.  Do not have throw rugs and other things on the floor that can make you trip. What can I do in the bedroom?  Use night lights.  Make sure that you have a light by your bed that is easy to reach.  Do not use any sheets or blankets that are too big for your bed. They should not hang down onto the floor.  Have a firm chair that has side arms. You can use this for support while you get dressed.  Do not have throw rugs and other things on the floor that can make you trip. What can I do in the kitchen?  Clean up any spills right away.  Avoid walking on wet floors.  Keep items that you use a lot in easy-to-reach places.  If you need to reach something above you, use a strong step stool that has a grab bar.  Keep electrical cords out of the way.  Do not use floor polish or wax that makes floors slippery. If you must use wax, use non-skid floor wax.  Do not have throw rugs and other things on the floor that can make you trip. What  can I do with my stairs?  Do not leave any items on the stairs.  Make sure that there are handrails on both sides of the stairs and use them. Fix handrails that are broken or loose. Make sure that handrails are as long as the stairways.  Check any carpeting to make sure that it is firmly attached to the stairs. Fix any carpet that is loose or worn.  Avoid having throw rugs at the top or bottom of the stairs. If you do have throw rugs, attach them to the floor with carpet tape.  Make sure that you have a light switch at the top of the stairs and the bottom of the stairs. If you do not have them, ask someone to add them for you. What else can I do to help prevent falls?  Wear shoes that:  Do not have high heels.  Have rubber bottoms.  Are comfortable and fit you well.  Are closed at the toe. Do not wear sandals.  If you use a stepladder:  Make sure that it is fully  opened. Do not climb a closed stepladder.  Make sure that both sides of the stepladder are locked into place.  Ask someone to hold it for you, if possible.  Clearly mark and make sure that you can see:  Any grab bars or handrails.  First and last steps.  Where the edge of each step is.  Use tools that help you move around (mobility aids) if they are needed. These include:  Canes.  Walkers.  Scooters.  Crutches.  Turn on the lights when you go into a dark area. Replace any light bulbs as soon as they burn out.  Set up your furniture so you have a clear path. Avoid moving your furniture around.  If any of your floors are uneven, fix them.  If there are any pets around you, be aware of where they are.  Review your medicines with your doctor. Some medicines can make you feel dizzy. This can increase your chance of falling. Ask your doctor what other things that you can do to help prevent falls. This information is not intended to replace advice given to you by your health care provider. Make sure you discuss any questions you have with your health care provider. Document Released: 11/12/2008 Document Revised: 06/24/2015 Document Reviewed: 02/20/2014 Elsevier Interactive Patient Education  2017 [employer].

## 2020-07-05 NOTE — Telephone Encounter (Signed)
Patient is requesting a refill of Vitamin D. Uses [facility] in [place].

## 2020-07-05 NOTE — Telephone Encounter (Signed)
done

## 2020-07-06 ENCOUNTER — Other Ambulatory Visit: Payer: Self-pay | Admitting: Interventional Radiology

## 2020-07-06 DIAGNOSIS — R911 Solitary pulmonary nodule: Secondary | ICD-10-CM

## 2020-07-07 ENCOUNTER — Other Ambulatory Visit: Payer: Self-pay

## 2020-07-07 ENCOUNTER — Ambulatory Visit
Admission: RE | Admit: 2020-07-07 | Discharge: 2020-07-07 | Disposition: A | Discharge status: Home / Self Care | Payer: Medicare HMO | Source: Ambulatory Visit | Attending: Interventional Radiology | Admitting: Interventional Radiology

## 2020-07-07 DIAGNOSIS — R911 Solitary pulmonary nodule: Secondary | ICD-10-CM

## 2020-07-07 DIAGNOSIS — Z85528 Personal history of other malignant neoplasm of kidney: Secondary | ICD-10-CM | POA: Diagnosis not present

## 2020-07-08 ENCOUNTER — Encounter: Payer: Self-pay | Admitting: *Deleted

## 2020-07-08 HISTORY — PX: IR RADIOLOGIST EVAL & MGMT: IMG5224

## 2020-07-08 NOTE — Progress Notes (Signed)
Patient ID: Amanda Crawford, female   DOB: 1945/02/25, 75 y.o.   MRN: [id number]       Chief Complaint: Patient was consulted remotely today ([place]) for  at the request of [place].    Referring Physician(s): Zola Button MD  History of Present Illness: Amanda Crawford is a 75 y.o. female history of 2001 left nephrectomy for renal cell carcinoma   by [name], with concurrent cholecystectomy by [name].  2014  Solitary lung metastases x2 were resected from the left lung by Drs.[name] and [place], On subsequent surveillance imaging, a new right lower pole enhancing renal mass was identified concerning for renal cell carcinoma.  No regional adenopathy.  No evidence of renal vein involvement. 05/24/2018 CT demonstrates slight increase in size of 1.4 cm hypervascular right renal mass   08/21/2018 technically successful CT-guided cryoablation of renal lesion without immediate complication.  Patient was discharged home the same day.  11/20/2018 CT showed ablation defect, no residual enhancing lesion 06/05/2019 CT shows embolization of ablation defect, no evidence of residual or recurrent lesion; no new metastatic disease, stable right cardiophrenic adenopathy   12/04/2019 CT Shows no residual/recurrent tissue and ablation site. 3 mm nonspecific right lower lobe pulmonary nodule described. No new adenopathy or other suggestion of metastatic disease.     She is doing well.  No significant pain at the ablation site around the abdomen.  No hematuria.  She had a recent upper respiratory episode.  She had follow-up CT on 06/03/2020.      Past Medical History:  Diagnosis Date   Allergic state 03/12/2015   Arthritis    Colon polyp 11/19/2014   Dermatitis 03/12/2015   Diarrhea 06/29/2016   Diverticulitis    DM (diabetes mellitus), type 2 ([place]) 06/29/2016   GERD (gastroesophageal reflux disease)    occ   Gout 03/12/2015   H/O measles    H/O mumps    Headache(784.0)    migraines   History  of chicken pox 11/23/2014   Hyperglycemia 06/29/2016   Lung cancer ([place])    Migraine 11/23/2014   Obesity 11/23/2014   Pneumonia    child   Preventative health care 09/12/2015   Renal cell cancer ([place])    renal cell ca dx 9/01 and 8/08;   Renal insufficiency    Shortness of breath    occ   Skin lesion of breast 03/12/2015    Past Surgical History:  Procedure Laterality Date   ABDOMINAL HYSTERECTOMY     APPENDECTOMY     CARDIAC CATHETERIZATION     yrs ago neg   CHOLECYSTECTOMY     COLONOSCOPY  2018   ESOPHAGOGASTRODUODENOSCOPY     years ago   IR RADIOLOGIST EVAL & MGMT  07/17/2018   IR RADIOLOGIST EVAL & MGMT  09/12/2018   IR RADIOLOGIST EVAL & MGMT  06/18/2019   IR RADIOLOGIST EVAL & MGMT  01/13/2020   KIDNEY SURGERY  2001   Removed of left kidney    LUNG CANCER SURGERY Left 08   RADIOLOGY WITH ANESTHESIA N/A 08/21/2018   Procedure: CT WITH ANESTHESIA RENAL CRYOABLATION;  Surgeon: Arne Cleveland, MD;  Location: [facility];  Service: Radiology;  Laterality: N/A;   RENAL MASS EXCISION Left 01   RIGHT/LEFT HEART CATH AND CORONARY ANGIOGRAPHY N/A 07/27/2016   Procedure: Right/Left Heart Cath and Coronary Angiography;  Surgeon: Leonie Man, MD;  Location: [facility];  Service: Cardiovascular;  Laterality: N/A;   THORACOTOMY Left 05/09/2012   Procedure:  THORACOTOMY MAJOR;  Surgeon: Gaye Pollack, MD;  Location: [facility];  Service: Thoracic;  Laterality: Left;   VIDEO BRONCHOSCOPY N/A 05/09/2012   Procedure: VIDEO BRONCHOSCOPY;  Surgeon: Gaye Pollack, MD;  Location: [facility];  Service: Thoracic;  Laterality: N/A;   WEDGE RESECTION Left 05/09/2012   Procedure: LEFT UPPER LOBE WEDGE RESECTION;  Surgeon: Gaye Pollack, MD;  Location: [facility];  Service: Thoracic;  Laterality: Left;    Allergies: Patient has no known allergies.  Medications: Prior to Admission medications   Medication Sig Start Date End Date Taking? Authorizing Provider  allopurinol (ZYLOPRIM) 300 MG tablet TAKE 1  TABLET BY MOUTH EVERY DAY 03/12/20   [name], MD  aspirin EC 81 MG tablet Take 81 mg by mouth daily.    [provider]  atorvastatin (LIPITOR) 20 MG tablet Take 1 tablet (20 mg total) by mouth daily. 08/06/19   [name], MD  Calcium Carb-Cholecalciferol (CALCIUM 600 + D PO) Take 1 tablet by mouth daily. Patient not taking: Reported on 07/05/2020    [provider]  carvedilol (COREG) 6.25 MG tablet TAKE 1 TABLET (6.25 MG TOTAL) BY MOUTH 2 (TWO) TIMES DAILY. PT NEEDS AN OV FOR FUTURE REFILLS 03/22/20   Pixie Casino, MD  diphenhydramine-acetaminophen (TYLENOL PM) 25-500 MG TABS Take 1 tablet by mouth at bedtime as needed (sleep).     [provider]  furosemide (LASIX) 40 MG tablet TAKE 1 TABLET BY MOUTH EVERY DAY 05/03/20   Hilty, Nadean Corwin, MD  glimepiride (AMARYL) 2 MG tablet TAKE 1 TABLET BY MOUTH DAILY WITH BREAKFAST 06/30/20   [name], MD  hyoscyamine (LEVSIN SL) 0.125 MG SL tablet Place 1 tablet (0.125 mg total) under the tongue every 6 (six) hours as needed. 07/07/19   [name], MD  irbesartan (AVAPRO) 75 MG tablet Take 1 tablet (75 mg total) by mouth daily. 04/05/20   Hilty, Nadean Corwin, MD  meclizine (ANTIVERT) 12.5 MG tablet Take 1 tablet (12.5 mg total) by mouth 3 (three) times daily as needed for dizziness. As needed for   Dizziness or nausea 11/23/14   [name], MD  metFORMIN (GLUCOPHAGE) 500 MG tablet TAKE 1 TABLET BY MOUTH 2 TIMES DAILY WITH A MEAL. 06/30/20   [name], MD  nystatin cream (MYCOSTATIN) Apply 1 application topically 2 (two) times daily. 06/07/17   [name], MD  OneTouch Delica Lancets 75Z MISC USE AS DIRECTED TO TEST DAILY 09/25/19   [name], MD  [facility] VERIO test strip [facility] BLOOD SUGAR DAILY AS NEEDED 02/17/20   [name], MD  Vitamin D, Ergocalciferol, (DRISDOL) 1.25 MG (50000 UNIT) CAPS capsule Take 1 capsule (50,000 Units total) by mouth every 7 (seven) days. 07/05/20   [name], MD   glyBURIDE-metformin [facility]) 1.25-250 MG tablet Take 1 tablet by mouth 2 (two) times daily with a meal. 03/25/19 03/25/19  [name], MD     Family History  Problem Relation Age of Onset   Heart failure Father    Diabetes Father    Hyperlipidemia Father    Heart disease Father    Hypertension Father    Asthma Brother    Asthma Daughter    Cancer Paternal Grandmother    Hearing loss Paternal Grandfather    Stroke Paternal Grandfather    Alcohol abuse Maternal Aunt    Colon cancer Paternal Aunt     Social History   Socioeconomic  History   Marital status: Married    Spouse name: Not on file   Number of children: Not on file   Years of education: Not on file   Highest education level: Not on file  Occupational History   Occupation: retired  Tobacco Use   Smoking status: Former    Packs/day: 0.50    Years: 35.00    Pack years: 17.50    Types: Cigarettes    Quit date: 05/08/2006    Years since quitting: 14.1   Smokeless tobacco: Never  Vaping Use   Vaping Use: Never used  Substance and Sexual Activity   Alcohol use: Yes    Comment: seldomly   Drug use: No   Sexual activity: Not on file    Comment: lives with husband, no dietary restrictions.   Other Topics Concern   Not on file  Social History Narrative   Not on file   Social Determinants of Health   Financial Resource Strain: Low Risk    Difficulty of Paying Living Expenses: Not hard at all  Food Insecurity: No Food Insecurity   Worried About Charity fundraiser in the Last Year: Never true   [place] in the Last Year: Never true  Transportation Needs: No Transportation Needs   Lack of Transportation (Medical): No   Lack of Transportation (Non-Medical): No  Physical Activity: Inactive   Days of Exercise per Week: 0 days   Minutes of Exercise per Session: 0 min  Stress: No Stress Concern Present   Feeling of Stress : Only a little  Social Connections: Moderately Integrated   Frequency of  Communication with Friends and Family: More than three times a week   Frequency of Social Gatherings with Friends and Family: Once a week   Attends Religious Services: More than 4 times per year   Active Member of [employer] or Organizations: No   Attends Archivist Meetings: Never   Marital Status: Married    ECOG Status: 0 - Asymptomatic  Review of Systems  Review of Systems: A 12 point ROS discussed and pertinent positives are indicated in the HPI above.  All other systems are negative.  Physical Exam No direct physical exam was performed (except for noted visual exam findings with Video Visits).     Vital Signs: There were no vitals taken for this visit.  Imaging:  CT CHEST WITH CONTRAST   CT ABDOMEN AND PELVIS WITH AND WITHOUT CONTRAST   TECHNIQUE: Multidetector CT imaging of the chest was performed during intravenous contrast administration. Multidetector CT imaging of the abdomen and pelvis was performed following the standard protocol before and during bolus administration of intravenous contrast.   CONTRAST:  87mL OMNIPAQUE IOHEXOL 300 MG/ML  SOLN   COMPARISON:  Multiple exams, including 06/05/2019   FINDINGS: CT CHEST FINDINGS   Cardiovascular: Coronary, aortic arch, and branch vessel atherosclerotic vascular disease.   Mediastinum/Nodes: Right anterior pericardial node 1.0 cm in short axis on image 66 of series 1, previously the same by my measurements.   Lungs/Pleura: Stable scarring in the right lower lobe. Newly appreciable 0.3 cm right lower lobe nodule in the posterior basal segment on image 98 of series 4. Postoperative findings medially in the left upper lobe. Mild airway plugging in the right lower lobe.   Musculoskeletal: Old left lateral fifth and sixth rib deformities with a small amount of bridging callus. This is unchanged. Minimal thoracic spondylosis.   CT ABDOMEN AND PELVIS FINDINGS   Hepatobiliary:  Hepatic cirrhosis. No  arterial phase enhancing lesion in the liver is identified to suggest hepatocellular carcinoma at this time. Cholecystectomy noted. Stable 6 mm hypodense lesion in the dome of the right hepatic lobe on image 12 of series 6.   Pancreas: Unremarkable   Spleen: The spleen measures 12.6 by 4.7 by 11.9 cm (volume = 370 cm^3). No focal splenic lesion.   Adrenals/Urinary Tract: Adrenal glands unremarkable. Left nephrectomy. Right kidney lower pole ablation site without findings of recurrence. Stable nonspecific 5 mm right kidney upper pole hypodense lesion medially on image 67 of series 6. Urinary bladder unremarkable. No urinary tract calculi observed.   Stomach/Bowel: Descending and sigmoid colon diverticulosis.   Vascular/Lymphatic: Aortoiliac atherosclerotic vascular disease. Stable likely reactive porta hepatis and peripancreatic lymph nodes including a 1.1 cm peripancreatic node on image 52 of series 10 (formerly 1.3 cm) and a stable portacaval node measuring 1.3 cm in short axis on image 60 of series 10 (formerly the same). Small retroperitoneal lymph nodes are similar to prior.   Reproductive: Uterus absent.  Adnexa unremarkable.   Other: Hypodense (-10 Hounsfield units) lesion in the left omentum measuring 1.9 by 1.2 cm on image 63 of series 10, formerly the same, highly likely to be incidental.   Musculoskeletal: Lumbar spondylosis and degenerative disc disease. No compelling findings of osseous metastatic disease.   IMPRESSION: 1. Newly appreciable 0.3 cm right lower lobe pulmonary nodule in the posterior basal segment, surveillance is suggested. 2. Other imaging findings of potential clinical significance: Coronary atherosclerosis. Mild airway plugging in the right lower lobe. Hepatic cirrhosis. Descending and sigmoid colon diverticulosis. Stable likely reactive porta hepatis and peripancreatic lymph nodes. Hypodense lesion in the left omentum measuring 1.9 by 1.2 cm,  formerly the same, highly likely to be incidental. Old left lateral fifth and sixth rib deformities with a small amount of bridging callus. Lumbar spondylosis and degenerative disc disease. 3. Aortic atherosclerosis.   Aortic Atherosclerosis (ICD10-I70.0).     Electronically Signed   By: Van Clines M.D.   On: 12/04/2019 14:04 Labs:  CBC: Recent Labs    08/05/19 0957 12/04/19 0933 06/02/20 0954  WBC 8.2 8.1 7.5  HGB 12.3 12.3 10.5*  HCT 37.9 39.1 33.0*  PLT 176.0 171 164    COAGS: Recent Labs    09/30/19 1014  INR 1.1*    BMP: Recent Labs    08/05/19 0957 08/20/19 0836 12/04/19 0933 06/02/20 0954  NA 138 136 139 140  K 5.2 No hemolysis seen* 4.6 4.6 4.6  CL 103 102 103 107  CO2 25 25 28 23   GLUCOSE 147* 195* 147* 157*  BUN 43* 27* 26* 34*  CALCIUM 10.7* CANCELED  10.3 10.7* 9.7  CREATININE 1.42* 1.27* 1.45* 1.58*  GFRNONAA  --   --  37* 34*    LIVER FUNCTION TESTS: Recent Labs    08/05/19 0957 08/20/19 0836 12/04/19 0933 06/02/20 0954  BILITOT 0.6 0.7 1.0 0.8  AST 44* 47* 51* 40  ALT 39* 41* 41 29  ALKPHOS 60 64 69 63  PROT 7.3 7.1 7.9 7.1  ALBUMIN 4.4 4.0 4.1 3.6    TUMOR MARKERS: No results for input(s): AFPTM, CEA, CA199, CHROMGRNA in the last 8760 hours.  Assessment and Plan:  My impression is that Ms. Sadlowski is doing very well with respect to the ablation of her renal lesion.  No evidence of local recurrence. The tiny nodule seen at the right lung base on the prior study is no longer  conspicuous, although there is a new cluster in the right lower lobe which given its clustering and morphology and lack of any other lesions elsewhere in the chest would be most consistent with infectious/inflammatory etiology, although short-term follow-up is appropriate.  Assuming this clears, we will keep her on the usual surveillance schedule until stability X 5 years is confirmed.  I reviewed the CT findings over the phone with the patient, and  the plan going forward.  She seemed to understand and  asked appropriate questions, which were answered.  We will make sure she gets her CT chest scheduled for early August to confirm resolution of the right lower lobe process.   Thank you for this interesting consult.  I greatly enjoyed meeting MYKIAH SCHMUCK and look forward to participating in their care.  A copy of this report was sent to the requesting provider on this date.  Electronically Signed: Rickard Rhymes 07/08/2020, 10:56 AM   I spent a total of    25 Minutes in remote  clinical consultation, greater than 50% of which was counseling/coordinating care for history of renal cell carcinoma.    Visit type: Audio only (telephone). Audio (no video) only due to patient's lack of internet/smartphone capability. Alternative for in-person consultation at [facility], [address], [place], [place]. This visit type was conducted due to national recommendations for restrictions regarding the COVID-19 Pandemic (e.g. social distancing).  This format is felt to be most appropriate for this patient at this time.  All issues noted in this document were discussed and addressed.

## 2020-07-19 ENCOUNTER — Telehealth: Payer: Self-pay | Admitting: Oncology

## 2020-07-19 NOTE — Telephone Encounter (Signed)
R/s appts per 6/20 sch msg. Pt aware.

## 2020-07-20 ENCOUNTER — Other Ambulatory Visit: Payer: Self-pay | Admitting: Family Medicine

## 2020-07-22 ENCOUNTER — Encounter: Payer: Self-pay | Admitting: *Deleted

## 2020-07-22 DIAGNOSIS — H04123 Dry eye syndrome of bilateral lacrimal glands: Secondary | ICD-10-CM | POA: Diagnosis not present

## 2020-07-22 DIAGNOSIS — H25013 Cortical age-related cataract, bilateral: Secondary | ICD-10-CM | POA: Diagnosis not present

## 2020-07-22 DIAGNOSIS — H2513 Age-related nuclear cataract, bilateral: Secondary | ICD-10-CM | POA: Diagnosis not present

## 2020-07-22 DIAGNOSIS — H40013 Open angle with borderline findings, low risk, bilateral: Secondary | ICD-10-CM | POA: Diagnosis not present

## 2020-07-22 DIAGNOSIS — H524 Presbyopia: Secondary | ICD-10-CM | POA: Diagnosis not present

## 2020-07-22 LAB — HM DIABETES EYE EXAM

## 2020-07-24 ENCOUNTER — Other Ambulatory Visit: Payer: Self-pay | Admitting: Family Medicine

## 2020-07-26 ENCOUNTER — Other Ambulatory Visit: Payer: Self-pay

## 2020-07-26 ENCOUNTER — Ambulatory Visit (HOSPITAL_BASED_OUTPATIENT_CLINIC_OR_DEPARTMENT_OTHER)
Admission: RE | Admit: 2020-07-26 | Discharge: 2020-07-26 | Disposition: A | Discharge status: Home / Self Care | Payer: Medicare HMO | Source: Ambulatory Visit | Attending: Family Medicine | Admitting: Family Medicine

## 2020-07-26 DIAGNOSIS — Z780 Asymptomatic menopausal state: Secondary | ICD-10-CM | POA: Insufficient documentation

## 2020-07-26 DIAGNOSIS — M85832 Other specified disorders of bone density and structure, left forearm: Secondary | ICD-10-CM | POA: Diagnosis not present

## 2020-07-27 IMAGING — CT CT ABD-PEL WO/W CM
2 of 13 series · 9 of 46 positions shown, 15 images · IV contrast (APPLIED)
Comparison: 11/20/2018 abdominal CT. Most recent chest and pelvic
CTs of 11/29/2017.

CLINICAL DATA: Stage IV renal cell carcinoma. Diagnosed in 5554
with lung metastasis in 7661, 14, 20. Diverticulitis. Asymptomatic.

EXAM:
CT CHEST WITH CONTRAST
CT ABDOMEN AND PELVIS WITH AND WITHOUT CONTRAST
TECHNIQUE: Multidetector CT imaging of the chest was performed during
intravenous contrast administration. Multidetector CT imaging of the
abdomen and pelvis was performed following the standard protocol
before and during bolus administration of intravenous contrast.
CONTRAST:  75mL OMNIPAQUE IOHEXOL 300 MG/ML  SOLN

[Series 3: coronal pre · coronal · non-contrast · 0.57mm/px · 2 of 109 slices shown, 3 images]
[im 37/109  soft-tissue]
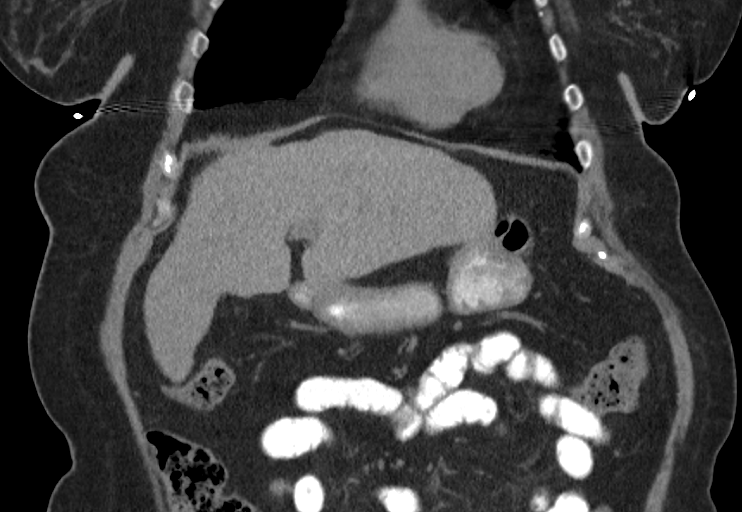
[im 37/109  bone]
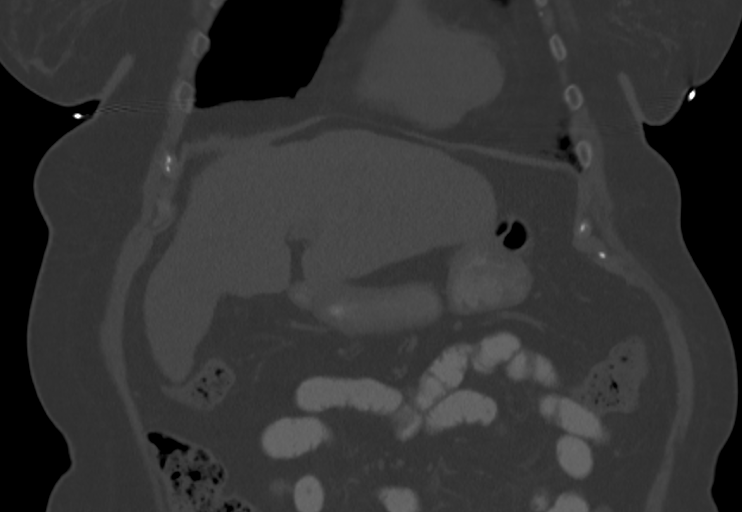
[im 73/109  soft-tissue]
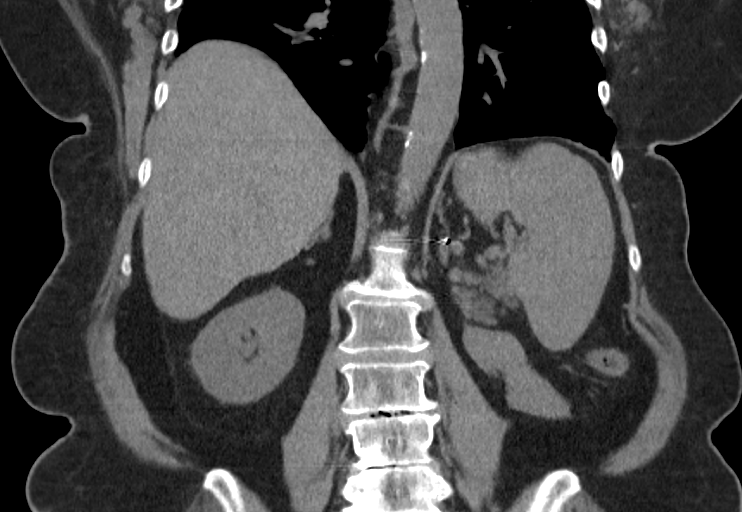

[Series 11: axial nephro · axial · 0.91mm/px · z∈[+1152,+1620]mm · 7 of 210 slices shown, 12 images]
[im 27/210  soft-tissue]
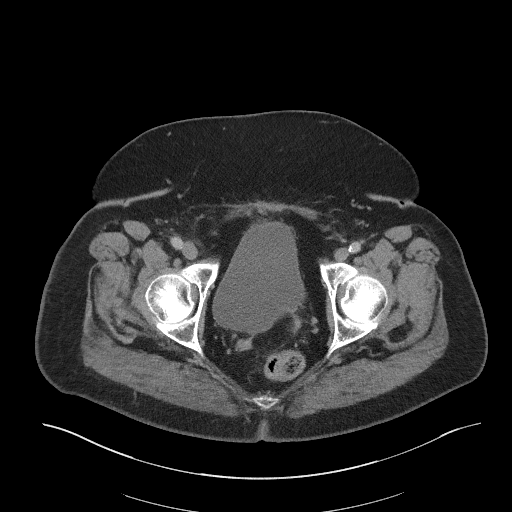
[im 27/210  bone]
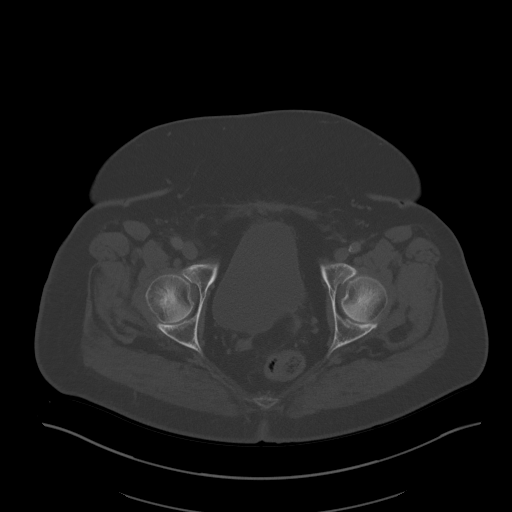
[im 53/210  soft-tissue]
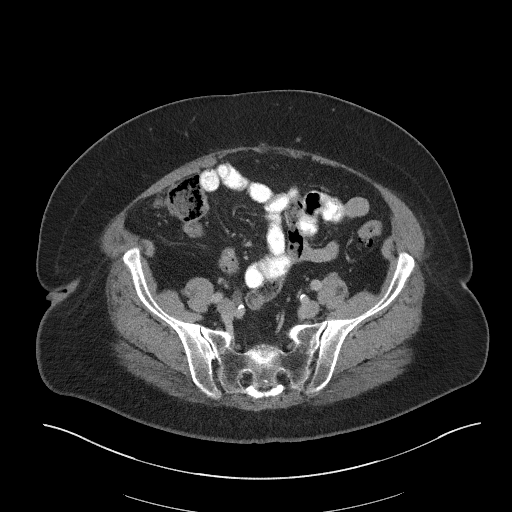
[im 79/210  soft-tissue]
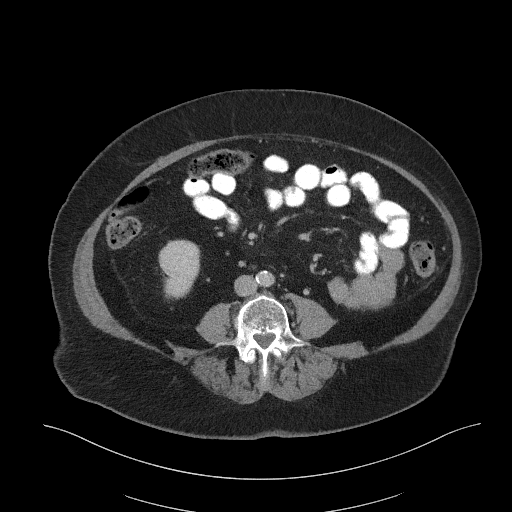
[im 105/210  soft-tissue]
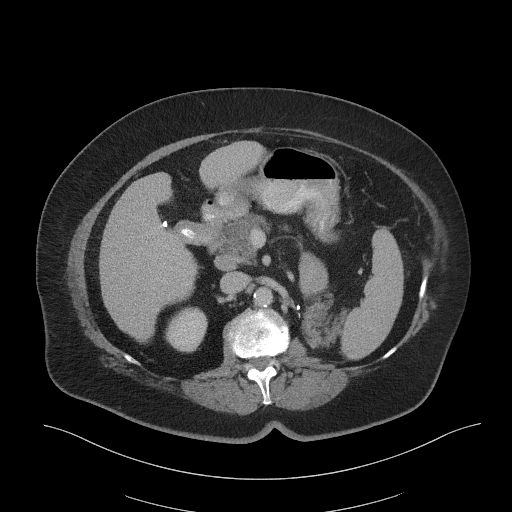
[im 105/210  lung]
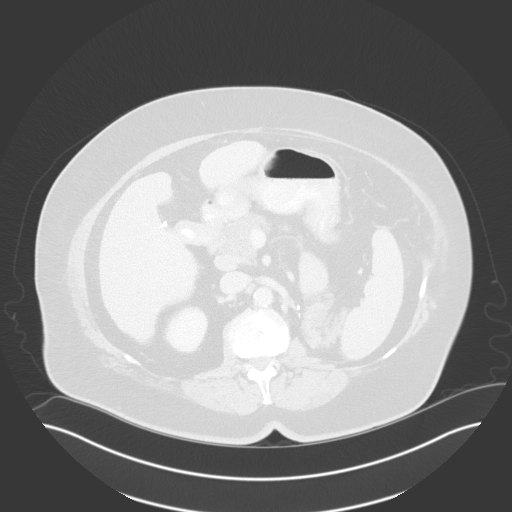
[im 131/210  soft-tissue]
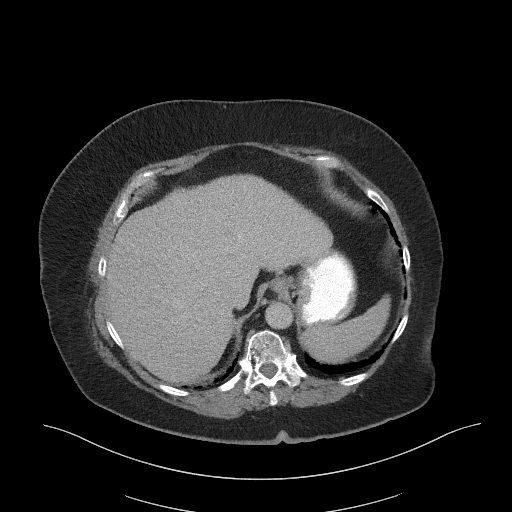
[im 131/210  lung]
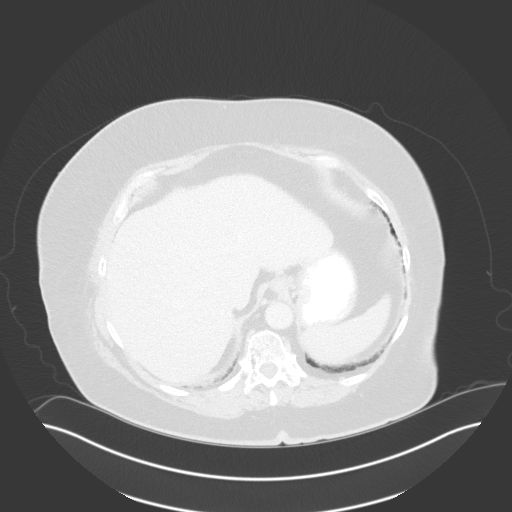
[im 157/210  soft-tissue]
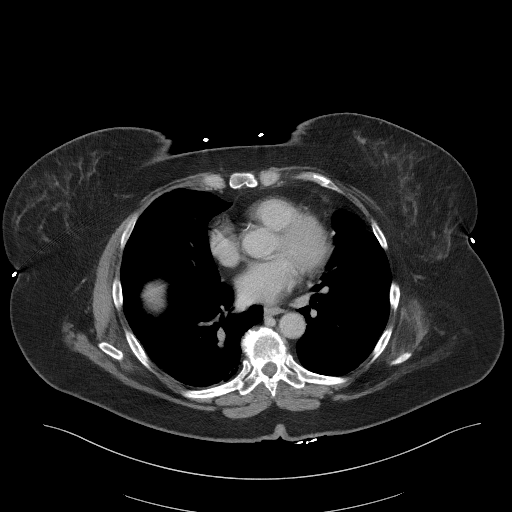
[im 157/210  lung]
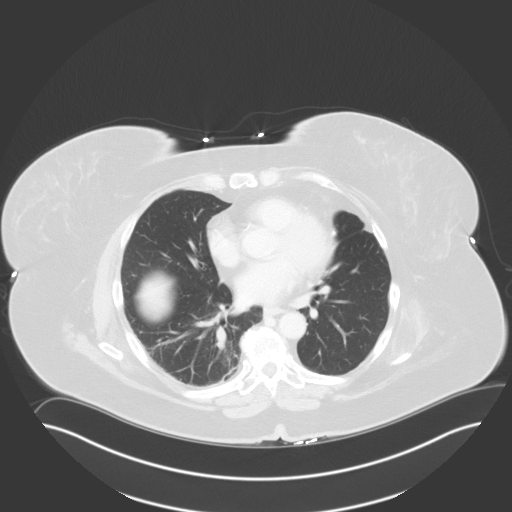
[im 183/210  soft-tissue]
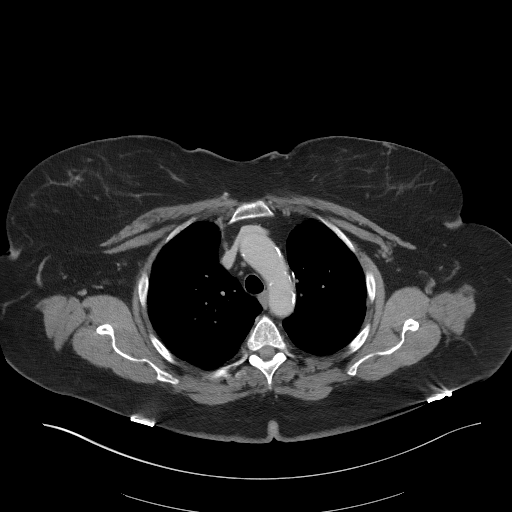
[im 183/210  lung]
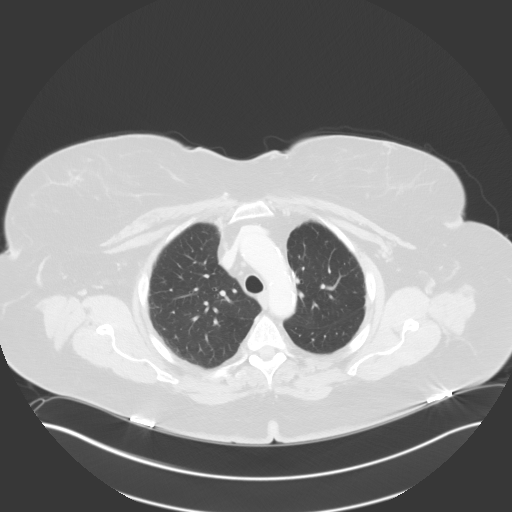

[9 of 46 positions shown; findings below may reference images not displayed]

FINDINGS: CT CHEST FINDINGS

Cardiovascular: Aortic atherosclerosis. Normal heart size, without
pericardial effusion. Multivessel coronary artery atherosclerosis.
No central pulmonary embolism, on this non-dedicated study.

Mediastinum/Nodes: No supraclavicular adenopathy. No mediastinal or
hilar adenopathy.

Right cardiophrenic angle node measures 1.5 x 0.9 cm on 68/11.
Similar to on the 11/20/2018 exam (when remeasured).

Lungs/Pleura: No pleural fluid. Left upper lobe wedge resection.
Mild centrilobular and paraseptal emphysema.

Right lower lobe scarring.  Right hemidiaphragm elevation.

Musculoskeletal: Lower cervical spondylosis.

CT ABDOMEN AND PELVIS FINDINGS

Hepatobiliary: Advanced cirrhosis. A too small to characterize 6 mm
high right hepatic lobe lesion on 55/11 similar to on the most
recent exam. Moderate hepatomegaly at 19.5 cm craniocaudal.
Cholecystectomy, without biliary ductal dilatation.

Pancreas: Fatty replacement throughout portions of the pancreatic
head and uncinate process. No duct dilatation or acute inflammation.

Spleen: Normal in size, without focal abnormality.

Adrenals/Urinary Tract: Normal adrenal glands. Left nephrectomy,
without locally recurrent disease.

Too small to characterize upper pole right renal lesion. Volume loss
within the inter/lower pole right kidney laterally, without findings
of locally recurrent disease. No hydronephrosis. Vascular
calcifications are immediately adjacent to the distal right ureter.
Normal urinary bladder.

Stomach/Bowel: Normal stomach, without wall thickening. Extensive
colonic diverticulosis. Normal terminal ileum. Normal small bowel.

Vascular/Lymphatic: Advanced aortic and branch vessel
atherosclerosis. Patent portal, splenic veins. No specific evidence
of portal venous hypertension. Mild upper abdominal adenopathy is
similar and likely related to cirrhosis. Example portacaval node at
1.3 cm on 102/11. No pelvic sidewall adenopathy.

Reproductive: Hysterectomy.  No adnexal mass.

Other: No significant free fluid.  Moderate pelvic floor laxity.

Musculoskeletal: Mild osteopenia.  Lumbosacral spondylosis.
IMPRESSION: 1. Status post left nephrectomy and right inter/lower pole right
renal ablation. No findings of metastatic disease in the chest,
abdomen, or pelvis.
2. Cirrhosis. Right cardiophrenic angle adenopathy is similar to the
11/20/2018 exam, most likely reactive. Recommend attention on
follow-up.
3. Aortic atherosclerosis (BO5CO-WNF.F), coronary artery
atherosclerosis and emphysema (BO5CO-[id number].J).
4. Pelvic floor laxity.

## 2020-08-11 ENCOUNTER — Other Ambulatory Visit: Payer: Self-pay | Admitting: Interventional Radiology

## 2020-08-11 DIAGNOSIS — R911 Solitary pulmonary nodule: Secondary | ICD-10-CM

## 2020-08-13 ENCOUNTER — Other Ambulatory Visit: Payer: Self-pay | Admitting: Family Medicine

## 2020-08-18 ENCOUNTER — Other Ambulatory Visit: Payer: Self-pay | Admitting: Family Medicine

## 2020-08-20 ENCOUNTER — Other Ambulatory Visit: Payer: Self-pay | Admitting: Family Medicine

## 2020-08-20 ENCOUNTER — Other Ambulatory Visit: Payer: Self-pay | Admitting: Internal Medicine

## 2020-08-27 ENCOUNTER — Other Ambulatory Visit: Payer: Self-pay | Admitting: Family Medicine

## 2020-08-31 ENCOUNTER — Other Ambulatory Visit: Payer: Medicare HMO

## 2020-09-07 ENCOUNTER — Ambulatory Visit: Payer: Medicare HMO | Admitting: Oncology

## 2020-09-08 ENCOUNTER — Other Ambulatory Visit: Payer: Self-pay

## 2020-09-08 ENCOUNTER — Telehealth: Payer: Self-pay | Admitting: Family Medicine

## 2020-09-08 ENCOUNTER — Telehealth: Payer: Medicare HMO

## 2020-09-08 MED ORDER — GLIMEPIRIDE 2 MG PO TABS: ORAL_TABLET | ORAL | 0 refills | Status: DC

## 2020-09-08 NOTE — Telephone Encounter (Signed)
Medication: glimepiride (AMARYL) 2 MG tablet [[id number]]     Has the patient contacted their pharmacy? Yes.   (If no, request that the patient contact the pharmacy for the refill.) (If yes, when and what did the pharmacy advise?)    Preferred Pharmacy (with phone number or street name): [facility] [place]

## 2020-09-09 ENCOUNTER — Ambulatory Visit (INDEPENDENT_AMBULATORY_CARE_PROVIDER_SITE_OTHER): Payer: Medicare HMO | Admitting: Family Medicine

## 2020-09-09 ENCOUNTER — Other Ambulatory Visit: Payer: Self-pay

## 2020-09-09 ENCOUNTER — Encounter: Payer: Self-pay | Admitting: Family Medicine

## 2020-09-09 ENCOUNTER — Ambulatory Visit (HOSPITAL_BASED_OUTPATIENT_CLINIC_OR_DEPARTMENT_OTHER)
Admission: RE | Admit: 2020-09-09 | Discharge: 2020-09-09 | Disposition: A | Discharge status: Home / Self Care | Payer: Medicare HMO | Source: Ambulatory Visit | Attending: Family Medicine | Admitting: Family Medicine

## 2020-09-09 VITALS — BP 130/88 | HR 83 | Temp 97.7°F | Resp 18 | Ht 64.0 in | Wt 216.8 lb

## 2020-09-09 DIAGNOSIS — R350 Frequency of micturition: Secondary | ICD-10-CM | POA: Diagnosis not present

## 2020-09-09 DIAGNOSIS — G8929 Other chronic pain: Secondary | ICD-10-CM | POA: Insufficient documentation

## 2020-09-09 DIAGNOSIS — M545 Low back pain, unspecified: Secondary | ICD-10-CM | POA: Diagnosis not present

## 2020-09-09 DIAGNOSIS — R300 Dysuria: Secondary | ICD-10-CM

## 2020-09-09 LAB — POC URINALSYSI DIPSTICK (AUTOMATED)
Bilirubin, UA: NEGATIVE
Blood, UA: NEGATIVE
Glucose, UA: NEGATIVE
Ketones, UA: NEGATIVE
Leukocytes, UA: NEGATIVE
Nitrite, UA: NEGATIVE
Protein, UA: NEGATIVE
Spec Grav, UA: 1.02 (ref 1.010–1.025)
Urobilinogen, UA: 0.2 E.U./dL
pH, UA: 5.5 (ref 5.0–8.0)

## 2020-09-09 LAB — COMPREHENSIVE METABOLIC PANEL
ALT: 27 U/L (ref 0–35)
AST: 33 U/L (ref 0–37)
Albumin: 4.2 g/dL (ref 3.5–5.2)
Alkaline Phosphatase: 67 U/L (ref 39–117)
BUN: 36 mg/dL — ABNORMAL HIGH (ref 6–23)
CO2: 25 mEq/L (ref 19–32)
Calcium: 10.6 mg/dL — ABNORMAL HIGH (ref 8.4–10.5)
Chloride: 103 mEq/L (ref 96–112)
Creatinine, Ser: 1.54 mg/dL — ABNORMAL HIGH (ref 0.40–1.20)
GFR: 32.4 mL/min — ABNORMAL LOW (ref >60.00)
Glucose, Bld: 170 mg/dL — ABNORMAL HIGH (ref 70–99)
Potassium: 5.2 mEq/L — ABNORMAL HIGH (ref 3.5–5.1)
Sodium: 137 mEq/L (ref 135–145)
Total Bilirubin: 0.9 mg/dL (ref 0.2–1.2)
Total Protein: 7 g/dL (ref 6.0–8.3)

## 2020-09-09 MED ORDER — METHOCARBAMOL 500 MG PO TABS: 500.0000 mg | ORAL_TABLET | Freq: Four times a day (QID) | ORAL | 1 refills | Status: DC

## 2020-09-09 NOTE — Progress Notes (Signed)
Subjective:   By signing my name below, I, Shehryar Baig, attest that this documentation has been prepared under the direction and in the presence of Dr. Roma Schanz, DO. 09/09/2020      Patient ID: Amanda Crawford, female    DOB: [birth year], 75 y.o.   MRN: [id number]  Chief Complaint  Patient presents with   Dysuria    Pt states having burning, freq, lower back pain.    HPI Patient is in today for a office visit. She complains of sore right lower back pain, frequency, and urgency at this time. She thinks she has a UTI. Her back pain started before she left for her vacation in [place]. The pain does not radiate to anywhere else and does not worsens when moving. She takes tylenol PM to manage her pain at night. She has a history of degenerative disk disease a while ago and sees a chiropractor to manage her symptoms. She does not remember what level her degenerative disk is. She also notes having only one kidney due to have the other removed.    Past Medical History:  Diagnosis Date   Allergic state 03/12/2015   Arthritis    Colon polyp 11/19/2014   Dermatitis 03/12/2015   Diarrhea 06/29/2016   Diverticulitis    DM (diabetes mellitus), type 2 ([place]) 06/29/2016   GERD (gastroesophageal reflux disease)    occ   Gout 03/12/2015   H/O measles    H/O mumps    Headache(784.0)    migraines   History of chicken pox 11/23/2014   Hyperglycemia 06/29/2016   Lung cancer ([place])    Migraine 11/23/2014   Obesity 11/23/2014   Pneumonia    child   Preventative health care 09/12/2015   Renal cell cancer ([place])    renal cell ca dx 9/01 and 8/08;   Renal insufficiency    Shortness of breath    occ   Skin lesion of breast 03/12/2015    Past Surgical History:  Procedure Laterality Date   ABDOMINAL HYSTERECTOMY     APPENDECTOMY     CARDIAC CATHETERIZATION     yrs ago neg   CHOLECYSTECTOMY     COLONOSCOPY  2018   ESOPHAGOGASTRODUODENOSCOPY     years ago   IR RADIOLOGIST EVAL & MGMT   07/17/2018   IR RADIOLOGIST EVAL & MGMT  09/12/2018   IR RADIOLOGIST EVAL & MGMT  06/18/2019   IR RADIOLOGIST EVAL & MGMT  01/13/2020   IR RADIOLOGIST EVAL & MGMT  07/08/2020   KIDNEY SURGERY  2001   Removed of left kidney    LUNG CANCER SURGERY Left 08   RADIOLOGY WITH ANESTHESIA N/A 08/21/2018   Procedure: CT WITH ANESTHESIA RENAL CRYOABLATION;  Surgeon: Arne Cleveland, MD;  Location: [facility];  Service: Radiology;  Laterality: N/A;   RENAL MASS EXCISION Left 01   RIGHT/LEFT HEART CATH AND CORONARY ANGIOGRAPHY N/A 07/27/2016   Procedure: Right/Left Heart Cath and Coronary Angiography;  Surgeon: Leonie Man, MD;  Location: [facility];  Service: Cardiovascular;  Laterality: N/A;   THORACOTOMY Left 05/09/2012   Procedure: THORACOTOMY MAJOR;  Surgeon: Gaye Pollack, MD;  Location: [facility];  Service: Thoracic;  Laterality: Left;   VIDEO BRONCHOSCOPY N/A 05/09/2012   Procedure: VIDEO BRONCHOSCOPY;  Surgeon: Gaye Pollack, MD;  Location: [facility];  Service: Thoracic;  Laterality: N/A;   WEDGE RESECTION Left 05/09/2012   Procedure: LEFT UPPER LOBE WEDGE RESECTION;  Surgeon: Gaye Pollack, MD;  Location: [facility];  Service: Thoracic;  Laterality: Left;    Family History  Problem Relation Age of Onset   Heart failure Father    Diabetes Father    Hyperlipidemia Father    Heart disease Father    Hypertension Father    Asthma Brother    Asthma Daughter    Cancer Paternal Grandmother    Hearing loss Paternal Grandfather    Stroke Paternal Grandfather    Alcohol abuse Maternal Aunt    Colon cancer Paternal Aunt     Social History   Socioeconomic History   Marital status: Married    Spouse name: Not on file   Number of children: Not on file   Years of education: Not on file   Highest education level: Not on file  Occupational History   Occupation: retired  Tobacco Use   Smoking status: Former    Packs/day: 0.50    Years: 35.00    Pack years: 17.50    Types: Cigarettes    Quit  date: 05/08/2006    Years since quitting: 14.3   Smokeless tobacco: Never  Vaping Use   Vaping Use: Never used  Substance and Sexual Activity   Alcohol use: Yes    Comment: seldomly   Drug use: No   Sexual activity: Not on file    Comment: lives with husband, no dietary restrictions.   Other Topics Concern   Not on file  Social History Narrative   Not on file   Social Determinants of Health   Financial Resource Strain: Low Risk    Difficulty of Paying Living Expenses: Not hard at all  Food Insecurity: No Food Insecurity   Worried About Charity fundraiser in the Last Year: Never true   [place] in the Last Year: Never true  Transportation Needs: No Transportation Needs   Lack of Transportation (Medical): No   Lack of Transportation (Non-Medical): No  Physical Activity: Inactive   Days of Exercise per Week: 0 days   Minutes of Exercise per Session: 0 min  Stress: No Stress Concern Present   Feeling of Stress : Only a little  Social Connections: Moderately Integrated   Frequency of Communication with Friends and Family: More than three times a week   Frequency of Social Gatherings with Friends and Family: Once a week   Attends Religious Services: More than 4 times per year   Active Member of [employer] or Organizations: No   Attends Music therapist: Never   Marital Status: Married  Human resources officer Violence: Not At Risk   Fear of Current or Ex-Partner: No   Emotionally Abused: No   Physically Abused: No   Sexually Abused: No    Outpatient Medications Prior to Visit  Medication Sig Dispense Refill   allopurinol (ZYLOPRIM) 300 MG tablet TAKE 1 TABLET BY MOUTH EVERY DAY 90 tablet 0   aspirin EC 81 MG tablet Take 81 mg by mouth daily.     atorvastatin (LIPITOR) 20 MG tablet TAKE 1 TABLET BY MOUTH EVERY DAY 90 tablet 0   carvedilol (COREG) 6.25 MG tablet TAKE 1 TABLET (6.25 MG TOTAL) BY MOUTH 2 (TWO) TIMES DAILY. PT NEEDS AN OV FOR FUTURE REFILLS 90 tablet 3    diphenhydramine-acetaminophen (TYLENOL PM) 25-500 MG TABS Take 1 tablet by mouth at bedtime as needed (sleep).      furosemide (LASIX) 40 MG tablet TAKE 1 TABLET BY MOUTH EVERY DAY 90 tablet 3   glimepiride (AMARYL)  2 MG tablet TAKE 1 TABLET BY MOUTH EVERY DAY WITH BREAKFAST 15 tablet 0   hyoscyamine (LEVSIN SL) 0.125 MG SL tablet Place 1 tablet (0.125 mg total) under the tongue every 6 (six) hours as needed. 30 tablet 0   irbesartan (AVAPRO) 75 MG tablet TAKE 1 TABLET BY MOUTH EVERY DAY 90 tablet 1   Lancets (ONETOUCH DELICA PLUS [id number]) MISC USE AS DIRECTED TO TEST DAILY 100 each 12   meclizine (ANTIVERT) 12.5 MG tablet Take 1 tablet (12.5 mg total) by mouth 3 (three) times daily as needed for dizziness. As needed for   Dizziness or nausea 30 tablet 1   metFORMIN (GLUCOPHAGE) 500 MG tablet TAKE 1 TABLET BY MOUTH 2 TIMES DAILY WITH A MEAL. 60 tablet 0   nystatin cream (MYCOSTATIN) Apply 1 application topically 2 (two) times daily. 30 g 1   ONETOUCH VERIO test strip CEHCK BLOOD SUGAR DAILY AS NEEDED 100 strip 12   Vitamin D, Ergocalciferol, (DRISDOL) 1.25 MG (50000 UNIT) CAPS capsule Take 1 capsule (50,000 Units total) by mouth every 7 (seven) days. 12 capsule 1   Calcium Carb-Cholecalciferol (CALCIUM 600 + D PO) Take 1 tablet by mouth daily. (Patient not taking: No sig reported)     No facility-administered medications prior to visit.    No Known Allergies  Review of Systems  Constitutional:  Negative for fever and malaise/fatigue.  HENT:  Negative for congestion.   Eyes:  Negative for blurred vision.  Respiratory:  Negative for cough and shortness of breath.   Cardiovascular:  Negative for chest pain, palpitations and leg swelling.  Gastrointestinal:  Negative for vomiting.  Genitourinary:  Positive for flank pain, frequency and urgency.  Musculoskeletal:  Negative for back pain.  Skin:  Negative for rash.  Neurological:  Negative for loss of consciousness and headaches.       Objective:    Physical Exam Vitals and nursing note reviewed.  Constitutional:      General: She is not in acute distress.    Appearance: Normal appearance. She is not ill-appearing.  HENT:     Head: Normocephalic and atraumatic.     Right Ear: External ear normal.     Left Ear: External ear normal.  Eyes:     Extraocular Movements: Extraocular movements intact.     Pupils: Pupils are equal, round, and reactive to light.  Cardiovascular:     Rate and Rhythm: Normal rate and regular rhythm.     Heart sounds: Normal heart sounds. No murmur heard.   No gallop.  Pulmonary:     Effort: Pulmonary effort is normal. No respiratory distress.     Breath sounds: Normal breath sounds. No wheezing or rales.  Musculoskeletal:     Comments: 5/5 strength in lower extremities. Full hip ROM and pain with straight leg raise Tenderness in right flank   Skin:    General: Skin is warm and dry.  Neurological:     Mental Status: She is alert and oriented to person, place, and time.  Psychiatric:        Behavior: Behavior normal.        Judgment: Judgment normal.    BP 130/88 (BP Location: Right Arm, Patient Position: Sitting, Cuff Size: Large)   Pulse 83   Temp 97.7 F (36.5 C) (Oral)   Resp 18   Ht 5' 4"  (1.626 m)   Wt 216 lb 12.8 oz (98.3 kg)   SpO2 95%   BMI 37.21 kg/m  Wt Readings from  Last 3 Encounters:  09/09/20 216 lb 12.8 oz (98.3 kg)  07/05/20 219 lb (99.3 kg)  03/04/20 219 lb 9.6 oz (99.6 kg)    Diabetic Foot Exam - Simple   No data filed    Lab Results  Component Value Date   WBC 7.5 06/02/2020   HGB 10.5 (L) 06/02/2020   HCT 33.0 (L) 06/02/2020   PLT 164 06/02/2020   GLUCOSE 157 (H) 06/02/2020   CHOL 193 09/30/2019   TRIG 267 (H) 09/30/2019   HDL 52.00 08/05/2019   LDLDIRECT 71.0 08/05/2019   LDLCALC (H) 01/31/2007    117        Total Cholesterol/HDL:CHD Risk Coronary Heart Disease Risk Table                     Men   Women  1/2 Average Risk   3.4   3.3   Average Risk       5.0   4.4  2 X Average Risk   9.6   7.1  3 X Average Risk  23.4   11.0        Use the calculated Patient Ratio above and the CHD Risk Table to determine the patient's CHD Risk.        ATP III CLASSIFICATION (LDL):  <100     mg/dL   Optimal  100-129  mg/dL   Near or Above                    Optimal  130-159  mg/dL   Borderline  160-189  mg/dL   High  >190     mg/dL   Very High   ALT 29 06/02/2020   AST 40 06/02/2020   NA 140 06/02/2020   K 4.6 06/02/2020   CL 107 06/02/2020   CREATININE 1.58 (H) 06/02/2020   BUN 34 (H) 06/02/2020   CO2 23 06/02/2020   TSH 2.40 08/05/2019   INR 1.1 (H) 09/30/2019   HGBA1C 6.6 (H) 08/05/2019    Lab Results  Component Value Date   TSH 2.40 08/05/2019   Lab Results  Component Value Date   WBC 7.5 06/02/2020   HGB 10.5 (L) 06/02/2020   HCT 33.0 (L) 06/02/2020   MCV 98.8 06/02/2020   PLT 164 06/02/2020   Lab Results  Component Value Date   NA 140 06/02/2020   K 4.6 06/02/2020   CHLORIDE 104 11/01/2016   CO2 23 06/02/2020   GLUCOSE 157 (H) 06/02/2020   BUN 34 (H) 06/02/2020   CREATININE 1.58 (H) 06/02/2020   BILITOT 0.8 06/02/2020   ALKPHOS 63 06/02/2020   AST 40 06/02/2020   ALT 29 06/02/2020   PROT 7.1 06/02/2020   ALBUMIN 3.6 06/02/2020   CALCIUM 9.7 06/02/2020   ANIONGAP 10 06/02/2020   EGFR 50 (L) 11/01/2016   GFR 40.88 (L) 08/20/2019   Lab Results  Component Value Date   CHOL 193 09/30/2019   Lab Results  Component Value Date   HDL 52.00 08/05/2019   Lab Results  Component Value Date   LDLCALC (H) 01/31/2007    117        Total Cholesterol/HDL:CHD Risk Coronary Heart Disease Risk Table                     Men   Women  1/2 Average Risk   3.4   3.3  Average Risk       5.0   4.4  2 X Average Risk   9.6   7.1  3 X Average Risk  23.4   11.0        Use the calculated Patient Ratio above and the CHD Risk Table to determine the patient's CHD Risk.        ATP III CLASSIFICATION (LDL):  <100      mg/dL   Optimal  100-129  mg/dL   Near or Above                    Optimal  130-159  mg/dL   Borderline  160-189  mg/dL   High  >190     mg/dL   Very High   Lab Results  Component Value Date   TRIG 267 (H) 09/30/2019   Lab Results  Component Value Date   CHOLHDL 4 08/05/2019   Lab Results  Component Value Date   HGBA1C 6.6 (H) 08/05/2019       Assessment & Plan:   Problem List Items Addressed This Visit       Unprioritized   Dysuria   Relevant Orders   POCT Urinalysis Dipstick (Automated) (Completed)   Urine Culture   Chronic midline low back pain without sciatica    Check xrays  ua normal  Check labs -- -pt is concerned because she only has 1 kidney      Relevant Medications   methocarbamol (ROBAXIN) 500 MG tablet   Other Relevant Orders   DG Lumbar Spine Complete   Comprehensive metabolic panel   Frequent urination - Primary    ua normal Check culture         Meds ordered this encounter  Medications   methocarbamol (ROBAXIN) 500 MG tablet    Sig: Take 1 tablet (500 mg total) by mouth 4 (four) times daily.    Dispense:  45 tablet    Refill:  1    I, Dr. Roma Schanz, DO, personally preformed the services described in this documentation.  All medical record entries made by the scribe were at my direction and in my presence.  I have reviewed the chart and discharge instructions (if applicable) and agree that the record reflects my personal performance and is accurate and complete. 09/09/2020   I,Shehryar Baig,acting as a Education administrator for [employer], DO.,have documented all relevant documentation on the behalf of Ann Held, DO,as directed by  Ann Held, DO while in the presence of Ann Held, DO.   Ann Held, DO

## 2020-09-09 NOTE — Patient Instructions (Signed)
[email or web address]">  Chronic Back Pain When back pain lasts longer than 3 months, it is called chronic back pain. The cause of your back pain may not be known. Some common causes include: Wear and tear (degenerative disease) of the bones, ligaments, or disks in your back. Inflammation and stiffness in your back (arthritis). People who have chronic back pain often go through certain periods in which the pain is more intense (flare-ups). Many people can learn to manage the pain with home care. Follow these instructions at home: Pay attention to any changes in your symptoms. Take these actions to help withyour pain: Managing pain and stiffness     If directed, apply ice to the painful area. Your health care provider may recommend applying ice during the first 24-48 hours after a flare-up begins. To do this: Put ice in a plastic bag. Place a towel between your skin and the bag. Leave the ice on for 20 minutes, 2-3 times per day. If directed, apply heat to the affected area as often as told by your health care provider. Use the heat source that your health care provider recommends, such as a moist heat pack or a heating pad. Place a towel between your skin and the heat source. Leave the heat on for 20-30 minutes. Remove the heat if your skin turns bright red. This is especially important if you are unable to feel pain, heat, or cold. You may have a greater risk of getting burned. Try soaking in a warm tub. Activity  Avoid bending and other activities that make the problem worse. Maintain a proper position when standing or sitting: When standing, keep your upper back and neck straight, with your shoulders pulled back. Avoid slouching. When sitting, keep your back straight and relax your shoulders. Do not round your shoulders or pull them backward. Do not sit or stand in one place for long periods of time. Take brief periods of rest throughout the day. This will reduce your  pain. Resting in a lying or standing position is usually better than sitting to rest. When you are resting for longer periods, mix in some mild activity or stretching between periods of rest. This will help to prevent stiffness and pain. Get regular exercise. Ask your health care provider what activities are safe for you. Do not lift anything that is heavier than 10 lb (4.5 kg), or the limit that you are told, until your health care provider says that it is safe. Always use proper lifting technique, which includes: Bending your knees. Keeping the load close to your body. Avoiding twisting. Sleep on a firm mattress in a comfortable position. Try lying on your side with your knees slightly bent. If you lie on your back, put a pillow under your knees.  Medicines Treatment may include medicines for pain and inflammation taken by mouth or applied to the skin, prescription pain medicine, or muscle relaxants. Take over-the-counter and prescription medicines only as told by your health care provider. Ask your health care provider if the medicine prescribed to you: Requires you to avoid driving or using machinery. Can cause constipation. You may need to take these actions to prevent or treat constipation: Drink enough fluid to keep your urine pale yellow. Take over-the-counter or prescription medicines. Eat foods that are high in fiber, such as beans, whole grains, and fresh fruits and vegetables. Limit foods that are high in fat and processed sugars, such as fried or sweet foods. General instructions Do not use any products that contain   nicotine or tobacco, such as cigarettes, e-cigarettes, and chewing tobacco. If you need help quitting, ask your health care provider. Keep all follow-up visits as told by your health care provider. This is important. Contact a health care provider if: You have pain that is not relieved with rest or medicine. Your pain gets worse, or you have new pain. You have a high  fever. You have rapid weight loss. You have trouble doing your normal activities. Get help right away if: You have weakness or numbness in one or both of your legs or feet. You have trouble controlling your bladder or your bowels. You have severe back pain and have any of the following: Nausea or vomiting. Pain in your abdomen. Shortness of breath or you faint. Summary Chronic back pain is back pain that lasts longer than 3 months. When a flare-up begins, apply ice to the painful area for the first 24-48 hours. Apply a moist heat pad or use a heating pad on the painful area as directed by your health care provider. When you are resting for longer periods, mix in some mild activity or stretching between periods of rest. This will help to prevent stiffness and pain. This information is not intended to replace advice given to you by your health care provider. Make sure you discuss any questions you have with your healthcare provider. Document Revised: 02/26/2019 Document Reviewed: 02/26/2019 Elsevier Patient Education  2022 [employer]vier Inc.  

## 2020-09-09 NOTE — Assessment & Plan Note (Signed)
ua normal Check culture

## 2020-09-09 NOTE — Assessment & Plan Note (Signed)
Check xrays  ua normal  Check labs -- -pt is concerned because she only has 1 kidney

## 2020-09-11 ENCOUNTER — Other Ambulatory Visit: Payer: Self-pay | Admitting: Family Medicine

## 2020-09-11 LAB — URINE CULTURE
MICRO NUMBER:: 12230527
SPECIMEN QUALITY:: ADEQUATE

## 2020-09-14 ENCOUNTER — Other Ambulatory Visit: Payer: Self-pay

## 2020-09-14 DIAGNOSIS — E875 Hyperkalemia: Secondary | ICD-10-CM

## 2020-09-14 MED ORDER — CEPHALEXIN 500 MG PO CAPS: 500.0000 mg | ORAL_CAPSULE | Freq: Two times a day (BID) | ORAL | 0 refills | Status: DC

## 2020-09-15 ENCOUNTER — Ambulatory Visit (HOSPITAL_COMMUNITY)
Admission: RE | Admit: 2020-09-15 | Discharge: 2020-09-15 | Disposition: A | Discharge status: Home / Self Care | Payer: Medicare HMO | Source: Ambulatory Visit | Attending: Oncology | Admitting: Oncology

## 2020-09-15 ENCOUNTER — Other Ambulatory Visit: Payer: Self-pay

## 2020-09-15 ENCOUNTER — Inpatient Hospital Stay: Payer: Medicare HMO | Attending: Oncology

## 2020-09-15 DIAGNOSIS — Z905 Acquired absence of kidney: Secondary | ICD-10-CM | POA: Diagnosis not present

## 2020-09-15 DIAGNOSIS — C641 Malignant neoplasm of right kidney, except renal pelvis: Secondary | ICD-10-CM | POA: Insufficient documentation

## 2020-09-15 DIAGNOSIS — Z79899 Other long term (current) drug therapy: Secondary | ICD-10-CM | POA: Insufficient documentation

## 2020-09-15 DIAGNOSIS — D4989 Neoplasm of unspecified behavior of other specified sites: Secondary | ICD-10-CM

## 2020-09-15 DIAGNOSIS — R918 Other nonspecific abnormal finding of lung field: Secondary | ICD-10-CM | POA: Diagnosis not present

## 2020-09-15 DIAGNOSIS — Z85528 Personal history of other malignant neoplasm of kidney: Secondary | ICD-10-CM | POA: Diagnosis not present

## 2020-09-15 DIAGNOSIS — Z9889 Other specified postprocedural states: Secondary | ICD-10-CM | POA: Insufficient documentation

## 2020-09-15 DIAGNOSIS — I700 Atherosclerosis of aorta: Secondary | ICD-10-CM | POA: Diagnosis not present

## 2020-09-15 DIAGNOSIS — R911 Solitary pulmonary nodule: Secondary | ICD-10-CM | POA: Diagnosis not present

## 2020-09-15 LAB — CBC WITH DIFFERENTIAL (CANCER CENTER ONLY)
Abs Immature Granulocytes: 0.02 10*3/uL (ref 0.00–0.07)
Basophils Absolute: 0 10*3/uL (ref 0.0–0.1)
Basophils Relative: 0 %
Eosinophils Absolute: 0.2 10*3/uL (ref 0.0–0.5)
Eosinophils Relative: 3 %
HCT: 36 % (ref 36.0–46.0)
Hemoglobin: 11.6 g/dL — ABNORMAL LOW (ref 12.0–15.0)
Immature Granulocytes: 0 %
Lymphocytes Relative: 23 %
Lymphs Abs: 1.7 10*3/uL (ref 0.7–4.0)
MCH: 31.7 pg (ref 26.0–34.0)
MCHC: 32.2 g/dL (ref 30.0–36.0)
MCV: 98.4 fL (ref 80.0–100.0)
Monocytes Absolute: 0.4 10*3/uL (ref 0.1–1.0)
Monocytes Relative: 6 %
Neutro Abs: 5 10*3/uL (ref 1.7–7.7)
Neutrophils Relative %: 68 %
Platelet Count: 151 10*3/uL (ref 150–400)
RBC: 3.66 MIL/uL — ABNORMAL LOW (ref 3.87–5.11)
RDW: 15.9 % — ABNORMAL HIGH (ref 11.5–15.5)
WBC Count: 7.3 10*3/uL (ref 4.0–10.5)
nRBC: 0 % (ref 0.0–0.2)

## 2020-09-15 LAB — CMP (CANCER CENTER ONLY)
ALT: 33 U/L (ref 0–44)
AST: 38 U/L (ref 15–41)
Albumin: 3.8 g/dL (ref 3.5–5.0)
Alkaline Phosphatase: 64 U/L (ref 38–126)
Anion gap: 9 (ref 5–15)
BUN: 34 mg/dL — ABNORMAL HIGH (ref 8–23)
CO2: 25 mmol/L (ref 22–32)
Calcium: 10.4 mg/dL — ABNORMAL HIGH (ref 8.9–10.3)
Chloride: 107 mmol/L (ref 98–111)
Creatinine: 1.32 mg/dL — ABNORMAL HIGH (ref 0.44–1.00)
GFR, Estimated: 42 mL/min — ABNORMAL LOW (ref >60)
Glucose, Bld: 161 mg/dL — ABNORMAL HIGH (ref 70–99)
Potassium: 4.8 mmol/L (ref 3.5–5.1)
Sodium: 141 mmol/L (ref 135–145)
Total Bilirubin: 0.7 mg/dL (ref 0.3–1.2)
Total Protein: 7.1 g/dL (ref 6.5–8.1)

## 2020-09-16 ENCOUNTER — Other Ambulatory Visit: Payer: Self-pay | Admitting: Family Medicine

## 2020-09-21 ENCOUNTER — Other Ambulatory Visit (INDEPENDENT_AMBULATORY_CARE_PROVIDER_SITE_OTHER): Payer: Medicare HMO

## 2020-09-21 ENCOUNTER — Other Ambulatory Visit: Payer: Self-pay

## 2020-09-21 DIAGNOSIS — E875 Hyperkalemia: Secondary | ICD-10-CM | POA: Diagnosis not present

## 2020-09-22 LAB — COMPREHENSIVE METABOLIC PANEL
ALT: 29 U/L (ref 0–35)
AST: 34 U/L (ref 0–37)
Albumin: 4.1 g/dL (ref 3.5–5.2)
Alkaline Phosphatase: 66 U/L (ref 39–117)
BUN: 48 mg/dL — ABNORMAL HIGH (ref 6–23)
CO2: 24 mEq/L (ref 19–32)
Calcium: 10.3 mg/dL (ref 8.4–10.5)
Chloride: 100 mEq/L (ref 96–112)
Creatinine, Ser: 1.95 mg/dL — ABNORMAL HIGH (ref 0.40–1.20)
GFR: 24.4 mL/min — ABNORMAL LOW (ref >60.00)
Glucose, Bld: 144 mg/dL — ABNORMAL HIGH (ref 70–99)
Potassium: 5.1 mEq/L (ref 3.5–5.1)
Sodium: 136 mEq/L (ref 135–145)
Total Bilirubin: 0.9 mg/dL (ref 0.2–1.2)
Total Protein: 6.9 g/dL (ref 6.0–8.3)

## 2020-09-23 ENCOUNTER — Inpatient Hospital Stay: Payer: Medicare HMO | Admitting: Oncology

## 2020-09-23 ENCOUNTER — Other Ambulatory Visit: Payer: Self-pay

## 2020-09-23 VITALS — BP 103/54 | HR 77 | Temp 97.7°F | Resp 17 | Wt 216.4 lb

## 2020-09-23 DIAGNOSIS — D4989 Neoplasm of unspecified behavior of other specified sites: Secondary | ICD-10-CM

## 2020-09-23 DIAGNOSIS — I700 Atherosclerosis of aorta: Secondary | ICD-10-CM | POA: Diagnosis not present

## 2020-09-23 DIAGNOSIS — C649 Malignant neoplasm of unspecified kidney, except renal pelvis: Secondary | ICD-10-CM

## 2020-09-23 DIAGNOSIS — Z85528 Personal history of other malignant neoplasm of kidney: Secondary | ICD-10-CM | POA: Diagnosis not present

## 2020-09-23 DIAGNOSIS — R918 Other nonspecific abnormal finding of lung field: Secondary | ICD-10-CM | POA: Diagnosis not present

## 2020-09-23 DIAGNOSIS — C641 Malignant neoplasm of right kidney, except renal pelvis: Secondary | ICD-10-CM | POA: Diagnosis not present

## 2020-09-23 DIAGNOSIS — Z9889 Other specified postprocedural states: Secondary | ICD-10-CM | POA: Diagnosis not present

## 2020-09-23 DIAGNOSIS — Z79899 Other long term (current) drug therapy: Secondary | ICD-10-CM | POA: Diagnosis not present

## 2020-09-23 DIAGNOSIS — Z905 Acquired absence of kidney: Secondary | ICD-10-CM | POA: Diagnosis not present

## 2020-09-23 NOTE — Progress Notes (Signed)
Hematology and Oncology Follow Up   Amanda Crawford [id number] [birth year] 75 y.o. 09/23/2020 9:03 AM Amanda Crawford, MDBlyth, [name], MD       Principle Diagnosis:  65 year old woman with kidney cancer diagnosed in 2001.  She developed stage IV clear-cell with isolated pulmonary metastasis and remains disease-free after surgical resection.     Secondary diagnosis: Right renal neoplasm diagnosed in July 2020.  She is status post cryoablation without any evidence of relapse.     Prior Therapy:  She is status post left nephrectomy in 2001.   She developed a left lingular mass, status post VATS procedure.  Pathology revealed a recurrent renal cell carcinoma resected in October 2008.    She developed relapse disease in 2014 and subsequently underwent a VATS and left upper lobe wedge resection of a tumor done on 05/09/2012. Pathology confirmed another relapse.  She is status post cryoablation of a right renal neoplasm on August 21, 2018.   Current therapy: Active surveillance.   Interim History: Amanda Crawford returns today for a follow-up visit.  Since her last visit, she reports no major changes in her health.  She denies any pulmonary complaints at this time.  She denies shortness of breath, difficulty breathing and very little cough at this point.  She denies any nausea vomiting or abdominal pain.  She denies any hospitalization or illnesses.      Medications: Reviewed without changes. Current Outpatient Medications  Medication Sig Dispense Refill   allopurinol (ZYLOPRIM) 300 MG tablet TAKE 1 TABLET BY MOUTH EVERY DAY 90 tablet 0   aspirin EC 81 MG tablet Take 81 mg by mouth daily.     atorvastatin (LIPITOR) 20 MG tablet TAKE 1 TABLET BY MOUTH EVERY DAY 90 tablet 0   Calcium Carb-Cholecalciferol (CALCIUM 600 + D PO) Take 1 tablet by mouth daily. (Patient not taking: No sig reported)     carvedilol (COREG) 6.25 MG tablet TAKE 1 TABLET (6.25 MG TOTAL) BY MOUTH 2 (TWO) TIMES DAILY. PT  NEEDS AN OV FOR FUTURE REFILLS 90 tablet 3   cephALEXin (KEFLEX) 500 MG capsule Take 1 capsule (500 mg total) by mouth 2 (two) times daily. 14 capsule 0   diphenhydramine-acetaminophen (TYLENOL PM) 25-500 MG TABS Take 1 tablet by mouth at bedtime as needed (sleep).      furosemide (LASIX) 40 MG tablet TAKE 1 TABLET BY MOUTH EVERY DAY 90 tablet 3   glimepiride (AMARYL) 2 MG tablet TAKE 1 TABLET BY MOUTH EVERY DAY WITH BREAKFAST 15 tablet 0   hyoscyamine (LEVSIN SL) 0.125 MG SL tablet Place 1 tablet (0.125 mg total) under the tongue every 6 (six) hours as needed. 30 tablet 0   irbesartan (AVAPRO) 75 MG tablet TAKE 1 TABLET BY MOUTH EVERY DAY 90 tablet 1   Lancets (ONETOUCH DELICA PLUS [id number]) MISC USE AS DIRECTED TO TEST DAILY 100 each 12   meclizine (ANTIVERT) 12.5 MG tablet Take 1 tablet (12.5 mg total) by mouth 3 (three) times daily as needed for dizziness. As needed for   Dizziness or nausea 30 tablet 1   metFORMIN (GLUCOPHAGE) 500 MG tablet TAKE 1 TABLET BY MOUTH 2 TIMES DAILY WITH A MEAL. 180 tablet 0   methocarbamol (ROBAXIN) 500 MG tablet Take 1 tablet (500 mg total) by mouth 4 (four) times daily. 45 tablet 1   nystatin cream (MYCOSTATIN) Apply 1 application topically 2 (two) times daily. 30 g 1   ONETOUCH VERIO test strip CEHCK BLOOD SUGAR DAILY AS NEEDED  100 strip 12   Vitamin D, Ergocalciferol, (DRISDOL) 1.25 MG (50000 UNIT) CAPS capsule Take 1 capsule (50,000 Units total) by mouth every 7 (seven) days. 12 capsule 1   No current facility-administered medications for this visit.   Physical exam:  ECOG 1 Blood pressure (!) 103/54, pulse 77, temperature 97.7 F (36.5 C), temperature source Oral, resp. rate 17, weight 216 lb 6.4 oz (98.2 kg), SpO2 96 %.   General appearance: Comfortable appearing without any discomfort Head: Normocephalic without any trauma Oropharynx: Mucous membranes are moist and pink without any thrush or ulcers. Eyes: Pupils are equal and round reactive to  light. Lymph nodes: No cervical, supraclavicular, inguinal or axillary lymphadenopathy.   Heart:regular rate and rhythm.  S1 and S2 without leg edema. Lung: Clear without any rhonchi or wheezes.  No dullness to percussion. Abdomin: Soft, nontender, nondistended with good bowel sounds.  No hepatosplenomegaly. Musculoskeletal: No joint deformity or effusion.  Full range of motion noted. Neurological: No deficits noted on motor, sensory and deep tendon reflex exam. Skin: No petechial rash or dryness.  Appeared moist.      Lab Results: Lab Results  Component Value Date   WBC 7.3 09/15/2020   HGB 11.6 (L) 09/15/2020   HCT 36.0 09/15/2020   MCV 98.4 09/15/2020   PLT 151 09/15/2020     Chemistry      Component Value Date/Time   NA 136 09/21/2020 1343   NA 139 08/23/2017 0933   NA 140 11/01/2016 0818   K 5.1 09/21/2020 1343   K 4.4 11/01/2016 0818   CL 100 09/21/2020 1343   CL 107 04/16/2012 0923   CO2 24 09/21/2020 1343   CO2 26 11/01/2016 0818   BUN 48 (H) 09/21/2020 1343   BUN 24 08/23/2017 0933   BUN 17.9 11/01/2016 0818   CREATININE 1.95 (H) 09/21/2020 1343   CREATININE 1.32 (H) 09/15/2020 0939   CREATININE 1.1 11/01/2016 0818   GLU 151 (H) 09/30/2019 1014      Component Value Date/Time   CALCIUM 10.3 09/21/2020 1343   CALCIUM 10.3 11/01/2016 0818   ALKPHOS 66 09/21/2020 1343   ALKPHOS 72 11/01/2016 0818   AST 34 09/21/2020 1343   AST 38 09/15/2020 0939   AST 75 (H) 11/01/2016 0818   ALT 29 09/21/2020 1343   ALT 33 09/15/2020 0939   ALT 75 (H) 11/01/2016 0818   BILITOT 0.9 09/21/2020 1343   BILITOT 0.7 09/15/2020 0939   BILITOT 0.78 11/01/2016 0818       IMPRESSION: 1. Decreased size of the cluster of several nodules in the posterior right costophrenic sulcus now measuring up to 7 mm, which are favored infectious or inflammatory continued attention on follow-up imaging suggested. No new suspicious pulmonary nodules or masses. 2. Cirrhotic hepatic  morphology. 3.  Aortic Atherosclerosis (ICD10-I70.0).  Impression and Plan:  75 year old woman with:       1.  Kidney cancer diagnosed in 2001.  She has stage IV clear-cell renal cell without any evidence of disease after surgical resection.   The natural course of her disease was reviewed and treatment choices were discussed at this time.  CT scan of the chest obtained on September 15, 2020 showed no evidence of any pulmonary metastasis.  Several nodules in the posterior right lung appear to be inflammatory and infectious in etiology rather than malignancy.  At this time I recommended continued active surveillance and monitoring her with scans in the future.  Systemic therapy will be used if  she has metastatic disease and is not resectable.  His options include immunotherapy, oral targeted therapy or combination of the above.   He is agreeable with this plan and we will update her staging scans in 6 months.   2.    Right kidney tumor: She is status post ablation without any evidence of recurrent disease.    3.  Follow-up: In 6 months for repeat evaluation.     30  minutes were dedicated to this visit. The time was spent on reviewing laboratory data, imaging studies, discussing treatment options,  and answering questions regarding future plan.   Zola Button, MD 09/23/2020 9:03 AM

## 2020-09-27 ENCOUNTER — Other Ambulatory Visit: Payer: Self-pay | Admitting: Family Medicine

## 2020-10-12 ENCOUNTER — Encounter: Payer: Self-pay | Admitting: *Deleted

## 2020-10-12 ENCOUNTER — Other Ambulatory Visit: Payer: Self-pay

## 2020-10-12 ENCOUNTER — Ambulatory Visit
Admission: RE | Admit: 2020-10-12 | Discharge: 2020-10-12 | Disposition: A | Discharge status: Home / Self Care | Payer: Medicare HMO | Source: Ambulatory Visit | Attending: Interventional Radiology | Admitting: Interventional Radiology

## 2020-10-12 DIAGNOSIS — N2889 Other specified disorders of kidney and ureter: Secondary | ICD-10-CM | POA: Diagnosis not present

## 2020-10-12 DIAGNOSIS — R918 Other nonspecific abnormal finding of lung field: Secondary | ICD-10-CM | POA: Diagnosis not present

## 2020-10-12 DIAGNOSIS — R911 Solitary pulmonary nodule: Secondary | ICD-10-CM

## 2020-10-12 DIAGNOSIS — Z9889 Other specified postprocedural states: Secondary | ICD-10-CM | POA: Diagnosis not present

## 2020-10-12 HISTORY — PX: IR RADIOLOGIST EVAL & MGMT: IMG5224

## 2020-10-12 NOTE — Progress Notes (Signed)
Patient ID: Amanda Crawford, female   DOB: May 03, 1945, 75 y.o.   MRN: [id number]       Chief Complaint: Patient was consulted remotely today (TeleHealth) for follow-up renal cryoablation at the request of Macarena Langseth.    Referring Physician(s): Zola Button MD  History of Present Illness: Amanda Crawford is a 75 y.o. female  history of 2001 left nephrectomy for renal cell carcinoma   by [name], with concurrent cholecystectomy by [name].  2014  Solitary lung metastases x2 were resected from the left lung by Drs.[name] and [place], On subsequent surveillance imaging, a new right lower pole enhancing renal mass was identified concerning for renal cell carcinoma.  No regional adenopathy.  No evidence of renal vein involvement. 05/24/2018 CT demonstrates slight increase in size of 1.4 cm hypervascular right renal mass   08/21/2018 technically successful CT-guided cryoablation of renal lesion without immediate complication.  Patient was discharged home the same day.  11/20/2018 CT showed ablation defect, no residual enhancing lesion 06/05/2019 CT shows embolization of ablation defect, no evidence of residual or recurrent lesion; no new metastatic disease, stable right cardiophrenic adenopathy   12/04/2019 CT Shows no residual/recurrent tissue and ablation site. 3 mm nonspecific right lower lobe pulmonary nodule described. No new adenopathy or other suggestion of metastatic disease.   06/04/2020 CT demonstrated no residual recurrent tissue at the ablation site.  Cluster of nodules in the posterior right costophrenic sulcus up to 11 mm  09/15/2020 she had follow-up CT chest.  She is doing well.  No pain at the treatment site.  No hematuria.  As always she seems in very good spirits.  Past Medical History:  Diagnosis Date   Allergic state 03/12/2015   Arthritis    Colon polyp 11/19/2014   Dermatitis 03/12/2015   Diarrhea 06/29/2016   Diverticulitis    DM (diabetes mellitus), type 2 ([place])  06/29/2016   GERD (gastroesophageal reflux disease)    occ   Gout 03/12/2015   H/O measles    H/O mumps    Headache(784.0)    migraines   History of chicken pox 11/23/2014   Hyperglycemia 06/29/2016   Lung cancer ([place])    Migraine 11/23/2014   Obesity 11/23/2014   Pneumonia    child   Preventative health care 09/12/2015   Renal cell cancer ([place])    renal cell ca dx 9/01 and 8/08;   Renal insufficiency    Shortness of breath    occ   Skin lesion of breast 03/12/2015    Past Surgical History:  Procedure Laterality Date   ABDOMINAL HYSTERECTOMY     APPENDECTOMY     CARDIAC CATHETERIZATION     yrs ago neg   CHOLECYSTECTOMY     COLONOSCOPY  2018   ESOPHAGOGASTRODUODENOSCOPY     years ago   IR RADIOLOGIST EVAL & MGMT  07/17/2018   IR RADIOLOGIST EVAL & MGMT  09/12/2018   IR RADIOLOGIST EVAL & MGMT  06/18/2019   IR RADIOLOGIST EVAL & MGMT  01/13/2020   IR RADIOLOGIST EVAL & MGMT  07/08/2020   KIDNEY SURGERY  2001   Removed of left kidney    LUNG CANCER SURGERY Left 08   RADIOLOGY WITH ANESTHESIA N/A 08/21/2018   Procedure: CT WITH ANESTHESIA RENAL CRYOABLATION;  Surgeon: Arne Cleveland, MD;  Location: [facility];  Service: Radiology;  Laterality: N/A;   RENAL MASS EXCISION Left 01   RIGHT/LEFT HEART CATH AND CORONARY ANGIOGRAPHY N/A 07/27/2016   Procedure: Right/Left Heart Cath  and Coronary Angiography;  Surgeon: Leonie Man, MD;  Location: [facility];  Service: Cardiovascular;  Laterality: N/A;   THORACOTOMY Left 05/09/2012   Procedure: THORACOTOMY MAJOR;  Surgeon: Gaye Pollack, MD;  Location: [facility];  Service: Thoracic;  Laterality: Left;   VIDEO BRONCHOSCOPY N/A 05/09/2012   Procedure: VIDEO BRONCHOSCOPY;  Surgeon: Gaye Pollack, MD;  Location: [facility];  Service: Thoracic;  Laterality: N/A;   WEDGE RESECTION Left 05/09/2012   Procedure: LEFT UPPER LOBE WEDGE RESECTION;  Surgeon: Gaye Pollack, MD;  Location: [facility];  Service: Thoracic;  Laterality: Left;     Allergies: Patient has no known allergies.  Medications: Prior to Admission medications   Medication Sig Start Date End Date Taking? Authorizing Provider  allopurinol (ZYLOPRIM) 300 MG tablet TAKE 1 TABLET BY MOUTH EVERY DAY 08/20/20   [name], MD  aspirin EC 81 MG tablet Take 81 mg by mouth daily.    [provider]  atorvastatin (LIPITOR) 20 MG tablet TAKE 1 TABLET BY MOUTH EVERY DAY 08/20/20   [name], MD  Calcium Carb-Cholecalciferol (CALCIUM 600 + D PO) Take 1 tablet by mouth daily. Patient not taking: No sig reported    [provider]  carvedilol (COREG) 6.25 MG tablet TAKE 1 TABLET (6.25 MG TOTAL) BY MOUTH 2 (TWO) TIMES DAILY. PT NEEDS AN OV FOR FUTURE REFILLS 03/22/20   Pixie Casino, MD  cephALEXin (KEFLEX) 500 MG capsule Take 1 capsule (500 mg total) by mouth 2 (two) times daily. 09/14/20   [place] Schanz R, DO  diphenhydramine-acetaminophen (TYLENOL PM) 25-500 MG TABS Take 1 tablet by mouth at bedtime as needed (sleep).     [provider]  furosemide (LASIX) 40 MG tablet TAKE 1 TABLET BY MOUTH EVERY DAY 05/03/20   Hilty, Nadean Corwin, MD  glimepiride (AMARYL) 2 MG tablet TAKE 1 TABLET BY MOUTH EVERY DAY WITH BREAKFAST 09/27/20   [name], MD  hyoscyamine (LEVSIN SL) 0.125 MG SL tablet Place 1 tablet (0.125 mg total) under the tongue every 6 (six) hours as needed. 07/07/19   [name], MD  irbesartan (AVAPRO) 75 MG tablet TAKE 1 TABLET BY MOUTH EVERY DAY 08/20/20   Hilty, Nadean Corwin, MD  Lancets [facility] DELICA PLUS [id number]) MISC USE AS DIRECTED TO TEST DAILY 07/21/20   [name], MD  meclizine (ANTIVERT) 12.5 MG tablet Take 1 tablet (12.5 mg total) by mouth 3 (three) times daily as needed for dizziness. As needed for   Dizziness or nausea 11/23/14   [name], MD  metFORMIN (GLUCOPHAGE) 500 MG tablet TAKE 1 TABLET BY MOUTH 2 TIMES DAILY WITH A MEAL. 09/13/20   [name], MD  methocarbamol (ROBAXIN) 500 MG  tablet Take 1 tablet (500 mg total) by mouth 4 (four) times daily. 09/09/20   Ann Held, DO  nystatin cream (MYCOSTATIN) Apply 1 application topically 2 (two) times daily. 06/07/17   [name], MD  ONETOUCH VERIO test strip [facility] BLOOD SUGAR DAILY AS NEEDED 02/17/20   [name], MD  Vitamin D, Ergocalciferol, (DRISDOL) 1.25 MG (50000 UNIT) CAPS capsule Take 1 capsule (50,000 Units total) by mouth every 7 (seven) days. 07/05/20   [name], MD  glyBURIDE-metformin [facility]) 1.25-250 MG tablet Take 1 tablet by mouth 2 (two) times daily with a meal. 03/25/19 03/25/19  [name], MD     Family History  Problem Relation Age of  Onset   Heart failure Father    Diabetes Father    Hyperlipidemia Father    Heart disease Father    Hypertension Father    Asthma Brother    Asthma Daughter    Cancer Paternal Grandmother    Hearing loss Paternal Grandfather    Stroke Paternal Grandfather    Alcohol abuse Maternal Aunt    Colon cancer Paternal Aunt     Social History   Socioeconomic History   Marital status: Married    Spouse name: Not on file   Number of children: Not on file   Years of education: Not on file   Highest education level: Not on file  Occupational History   Occupation: retired  Tobacco Use   Smoking status: Former    Packs/day: 0.50    Years: 35.00    Pack years: 17.50    Types: Cigarettes    Quit date: 05/08/2006    Years since quitting: 14.4   Smokeless tobacco: Never  Vaping Use   Vaping Use: Never used  Substance and Sexual Activity   Alcohol use: Yes    Comment: seldomly   Drug use: No   Sexual activity: Not on file    Comment: lives with husband, no dietary restrictions.   Other Topics Concern   Not on file  Social History Narrative   Not on file   Social Determinants of Health   Financial Resource Strain: Low Risk    Difficulty of Paying Living Expenses: Not hard at all  Food Insecurity: No Food Insecurity   Worried About  Charity fundraiser in the Last Year: Never true   [place] in the Last Year: Never true  Transportation Needs: No Transportation Needs   Lack of Transportation (Medical): No   Lack of Transportation (Non-Medical): No  Physical Activity: Inactive   Days of Exercise per Week: 0 days   Minutes of Exercise per Session: 0 min  Stress: No Stress Concern Present   Feeling of Stress : Only a little  Social Connections: Moderately Integrated   Frequency of Communication with Friends and Family: More than three times a week   Frequency of Social Gatherings with Friends and Family: Once a week   Attends Religious Services: More than 4 times per year   Active Member of [employer] or Organizations: No   Attends Archivist Meetings: Never   Marital Status: Married    ECOG Status: 0 - Asymptomatic  Review of Systems  Review of Systems: A 12 point ROS discussed and pertinent positives are indicated in the HPI above.  All other systems are negative.  Physical Exam No direct physical exam was performed (except for noted visual exam findings with Video Visits).     Vital Signs: There were no vitals taken for this visit.  Imaging: CT Chest Wo Contrast  Result Date: 09/16/2020 CLINICAL DATA:  History of renal cancer with lung metastases, post lung resection and chemotherapy, assess treatment response. EXAM: CT CHEST WITHOUT CONTRAST TECHNIQUE: Multidetector CT imaging of the chest was performed following the standard protocol without IV contrast. COMPARISON:  Jun 03, 2020. FINDINGS: Cardiovascular: Atherosclerotic calcifications of the thoracic aorta without aneurysmal dilation. Normal caliber central pulmonary arteries. Coronary artery calcifications. Normal size heart. No significant pericardial effusion/thickening. Mediastinum/Nodes: No discrete thyroid nodule. No pathologically enlarged mediastinal or axillary lymph nodes. Stable 9 mm right juxta cardiac lymph node on image 101/2.  Lungs/Pleura: Decreased size of the cluster of several nodules in  the posterior right costophrenic sulcus now measuring up to 7 mm, previously 11 mm on image 112/5. No new suspicious pulmonary nodules or masses. Surgical change of wedge resection in the left upper lobe. No pleural effusion. No pneumothorax. Upper Abdomen: Cirrhotic hepatic morphology.  No acute abnormality. Musculoskeletal: Thoracic spondylosis. No aggressive lytic or blastic lesion of bone. IMPRESSION: 1. Decreased size of the cluster of several nodules in the posterior right costophrenic sulcus now measuring up to 7 mm, which are favored infectious or inflammatory continued attention on follow-up imaging suggested. No new suspicious pulmonary nodules or masses. 2. Cirrhotic hepatic morphology. 3.  Aortic Atherosclerosis (ICD10-I70.0). Electronically Signed   By: Dahlia Bailiff M.D.   On: 09/16/2020 08:38    Labs:  CBC: Recent Labs    12/04/19 0933 06/02/20 0954 09/15/20 0939  WBC 8.1 7.5 7.3  HGB 12.3 10.5* 11.6*  HCT 39.1 33.0* 36.0  PLT 171 164 151    COAGS: No results for input(s): INR, APTT in the last 8760 hours.  BMP: Recent Labs    12/04/19 0933 06/02/20 0954 09/09/20 1104 09/15/20 0939 09/21/20 1343  NA 139 140 137 141 136  K 4.6 4.6 5.2 No hemolysis seen* 4.8 5.1  CL 103 107 103 107 100  CO2 28 23 25 25 24   GLUCOSE 147* 157* 170* 161* 144*  BUN 26* 34* 36* 34* 48*  CALCIUM 10.7* 9.7 10.6* 10.4* 10.3  CREATININE 1.45* 1.58* 1.54* 1.32* 1.95*  GFRNONAA 37* 34*  --  42*  --     LIVER FUNCTION TESTS: Recent Labs    06/02/20 0954 09/09/20 1104 09/15/20 0939 09/21/20 1343  BILITOT 0.8 0.9 0.7 0.9  AST 40 33 38 34  ALT 29 27 33 29  ALKPHOS 63 67 64 66  PROT 7.1 7.0 7.1 6.9  ALBUMIN 3.6 4.2 3.8 4.1    TUMOR MARKERS: No results for input(s): AFPTM, CEA, CA199, CHROMGRNA in the last 8760 hours.  Assessment and Plan:  My impression is that she is doing very well post ablation of her renal  neoplasm.  I am happy to see some regression of the pulmonary nodules at the right lung base suggesting infectious/inflammatory etiology instead of metastatic disease.  Will continue CT surveillance of the left renal ablation site   For total of 5 years post.  Next scan in 9 months, unless she gets an interval CT abdomen without and with contrast in the interval at which point we will review and base timing off off of that 1.  Thank you for this interesting consult.  I greatly enjoyed meeting Amanda Crawford and look forward to participating in their care.  A copy of this report was sent to the requesting provider on this date.  Electronically Signed: Rickard Rhymes 10/12/2020, 11:48 AM   I spent a total of    15 Minutes in remote  clinical consultation, greater than 50% of which was counseling/coordinating care for Right renal mass, post cryoablation.    Visit type: Audio only (telephone). Audio (no video) only due to patient's lack of internet/smartphone capability. Alternative for in-person consultation at [facility], [address] [place], [place], [place]. This visit type was conducted due to national recommendations for restrictions regarding the COVID-19 Pandemic (e.g. social distancing).  This format is felt to be most appropriate for this patient at this time.  All issues noted in this document were discussed and addressed.

## 2020-10-14 ENCOUNTER — Encounter: Payer: Self-pay | Admitting: Family Medicine

## 2020-10-14 ENCOUNTER — Other Ambulatory Visit: Payer: Self-pay

## 2020-10-14 ENCOUNTER — Ambulatory Visit (INDEPENDENT_AMBULATORY_CARE_PROVIDER_SITE_OTHER): Payer: Medicare HMO | Admitting: Family Medicine

## 2020-10-14 VITALS — BP 126/50 | HR 67 | Temp 98.1°F | Resp 16 | Wt 218.0 lb

## 2020-10-14 DIAGNOSIS — M1A9XX0 Chronic gout, unspecified, without tophus (tophi): Secondary | ICD-10-CM | POA: Diagnosis not present

## 2020-10-14 DIAGNOSIS — I5022 Chronic systolic (congestive) heart failure: Secondary | ICD-10-CM | POA: Diagnosis not present

## 2020-10-14 DIAGNOSIS — E119 Type 2 diabetes mellitus without complications: Secondary | ICD-10-CM

## 2020-10-14 DIAGNOSIS — R252 Cramp and spasm: Secondary | ICD-10-CM

## 2020-10-14 DIAGNOSIS — E785 Hyperlipidemia, unspecified: Secondary | ICD-10-CM | POA: Diagnosis not present

## 2020-10-14 DIAGNOSIS — L309 Dermatitis, unspecified: Secondary | ICD-10-CM

## 2020-10-14 DIAGNOSIS — R8279 Other abnormal findings on microbiological examination of urine: Secondary | ICD-10-CM | POA: Diagnosis not present

## 2020-10-14 DIAGNOSIS — I2510 Atherosclerotic heart disease of native coronary artery without angina pectoris: Secondary | ICD-10-CM | POA: Diagnosis not present

## 2020-10-14 DIAGNOSIS — N3000 Acute cystitis without hematuria: Secondary | ICD-10-CM | POA: Diagnosis not present

## 2020-10-14 DIAGNOSIS — R32 Unspecified urinary incontinence: Secondary | ICD-10-CM

## 2020-10-14 DIAGNOSIS — E875 Hyperkalemia: Secondary | ICD-10-CM

## 2020-10-14 DIAGNOSIS — R7989 Other specified abnormal findings of blood chemistry: Secondary | ICD-10-CM | POA: Diagnosis not present

## 2020-10-14 DIAGNOSIS — N289 Disorder of kidney and ureter, unspecified: Secondary | ICD-10-CM

## 2020-10-14 DIAGNOSIS — R197 Diarrhea, unspecified: Secondary | ICD-10-CM

## 2020-10-14 MED ORDER — HYOSCYAMINE SULFATE 0.125 MG SL SUBL: 0.1250 mg | SUBLINGUAL_TABLET | Freq: Four times a day (QID) | SUBLINGUAL | 1 refills | Status: DC | PRN

## 2020-10-14 MED ORDER — FLUCONAZOLE 150 MG PO TABS: 150.0000 mg | ORAL_TABLET | ORAL | 0 refills | Status: DC

## 2020-10-14 NOTE — Progress Notes (Signed)
Subjective:   By signing my name below, I, Burnett Corrente, attest that this documentation has been prepared under the direction and in the presence of Penni Homans 10/14/2020   Patient ID: Amanda Crawford, female    DOB: [birth year], 75 y.o.   MRN: [id number]  Chief Complaint  Patient presents with   Follow-up    HPI Patient is in today for office visit and other chronic medical concerns.  She reports, that she still is dealing with her mother passed in January at the age of 56 from dementia but she includes, she has been doing fine. After last visit, she experienced dysuria and was told her potasium was high normal but recently talked to her oncologist and had a CT scan that showed normal results and potassium had dropped.  She watches her salt and sugar intake in her meals.   Vision exam completed 07/27/2020 and DEXA scan on 07/26/2020 which showed osteopenic. and mammogram 03/24/2020 which showed left breast probably benign calcifications.   Patient denies chest pains, shortness of breath, palpitations, chest tightness, blood in stool, dizziness, fatigue, headaches, syncope, GU or GI c/o.   Past Medical History:  Diagnosis Date   Allergic state 03/12/2015   Arthritis    Colon polyp 11/19/2014   Dermatitis 03/12/2015   Diarrhea 06/29/2016   Diverticulitis    DM (diabetes mellitus), type 2 ([place]) 06/29/2016   GERD (gastroesophageal reflux disease)    occ   Gout 03/12/2015   H/O measles    H/O mumps    Headache(784.0)    migraines   History of chicken pox 11/23/2014   Hyperglycemia 06/29/2016   Lung cancer ([place])    Migraine 11/23/2014   Obesity 11/23/2014   Pneumonia    child   Preventative health care 09/12/2015   Renal cell cancer ([place])    renal cell ca dx 9/01 and 8/08;   Renal insufficiency    Shortness of breath    occ   Skin lesion of breast 03/12/2015    Past Surgical History:  Procedure Laterality Date   ABDOMINAL HYSTERECTOMY     APPENDECTOMY     CARDIAC  CATHETERIZATION     yrs ago neg   CHOLECYSTECTOMY     COLONOSCOPY  2018   ESOPHAGOGASTRODUODENOSCOPY     years ago   IR RADIOLOGIST EVAL & MGMT  07/17/2018   IR RADIOLOGIST EVAL & MGMT  09/12/2018   IR RADIOLOGIST EVAL & MGMT  06/18/2019   IR RADIOLOGIST EVAL & MGMT  01/13/2020   IR RADIOLOGIST EVAL & MGMT  07/08/2020   IR RADIOLOGIST EVAL & MGMT  10/12/2020   KIDNEY SURGERY  2001   Removed of left kidney    LUNG CANCER SURGERY Left 08   RADIOLOGY WITH ANESTHESIA N/A 08/21/2018   Procedure: CT WITH ANESTHESIA RENAL CRYOABLATION;  Surgeon: Arne Cleveland, MD;  Location: [facility];  Service: Radiology;  Laterality: N/A;   RENAL MASS EXCISION Left 01   RIGHT/LEFT HEART CATH AND CORONARY ANGIOGRAPHY N/A 07/27/2016   Procedure: Right/Left Heart Cath and Coronary Angiography;  Surgeon: Leonie Man, MD;  Location: [facility];  Service: Cardiovascular;  Laterality: N/A;   THORACOTOMY Left 05/09/2012   Procedure: THORACOTOMY MAJOR;  Surgeon: Gaye Pollack, MD;  Location: [facility];  Service: Thoracic;  Laterality: Left;   VIDEO BRONCHOSCOPY N/A 05/09/2012   Procedure: VIDEO BRONCHOSCOPY;  Surgeon: Gaye Pollack, MD;  Location: [facility];  Service: Thoracic;  Laterality: N/A;  WEDGE RESECTION Left 05/09/2012   Procedure: LEFT UPPER LOBE WEDGE RESECTION;  Surgeon: Gaye Pollack, MD;  Location: [facility];  Service: Thoracic;  Laterality: Left;    Family History  Problem Relation Age of Onset   Dementia Mother    Heart failure Father    Diabetes Father    Hyperlipidemia Father    Heart disease Father    Hypertension Father    Asthma Brother    Asthma Daughter    Alcohol abuse Maternal Aunt    Colon cancer Paternal Aunt    Cancer Paternal Grandmother    Hearing loss Paternal Grandfather    Stroke Paternal Grandfather     Social History   Socioeconomic History   Marital status: Married    Spouse name: Not on file   Number of children: Not on file   Years of education: Not on file    Highest education level: Not on file  Occupational History   Occupation: retired  Tobacco Use   Smoking status: Former    Packs/day: 0.50    Years: 35.00    Pack years: 17.50    Types: Cigarettes    Quit date: 05/08/2006    Years since quitting: 14.4   Smokeless tobacco: Never  Vaping Use   Vaping Use: Never used  Substance and Sexual Activity   Alcohol use: Yes    Comment: seldomly   Drug use: No   Sexual activity: Not on file    Comment: lives with husband, no dietary restrictions.   Other Topics Concern   Not on file  Social History Narrative   Not on file   Social Determinants of Health   Financial Resource Strain: Low Risk    Difficulty of Paying Living Expenses: Not hard at all  Food Insecurity: No Food Insecurity   Worried About Charity fundraiser in the Last Year: Never true   [place] in the Last Year: Never true  Transportation Needs: No Transportation Needs   Lack of Transportation (Medical): No   Lack of Transportation (Non-Medical): No  Physical Activity: Inactive   Days of Exercise per Week: 0 days   Minutes of Exercise per Session: 0 min  Stress: No Stress Concern Present   Feeling of Stress : Only a little  Social Connections: Moderately Integrated   Frequency of Communication with Friends and Family: More than three times a week   Frequency of Social Gatherings with Friends and Family: Once a week   Attends Religious Services: More than 4 times per year   Active Member of [employer] or Organizations: No   Attends Music therapist: Never   Marital Status: Married  Human resources officer Violence: Not At Risk   Fear of Current or Ex-Partner: No   Emotionally Abused: No   Physically Abused: No   Sexually Abused: No    Outpatient Medications Prior to Visit  Medication Sig Dispense Refill   allopurinol (ZYLOPRIM) 300 MG tablet TAKE 1 TABLET BY MOUTH EVERY DAY 90 tablet 0   aspirin EC 81 MG tablet Take 81 mg by mouth daily.     atorvastatin  (LIPITOR) 20 MG tablet TAKE 1 TABLET BY MOUTH EVERY DAY 90 tablet 0   carvedilol (COREG) 6.25 MG tablet TAKE 1 TABLET (6.25 MG TOTAL) BY MOUTH 2 (TWO) TIMES DAILY. PT NEEDS AN OV FOR FUTURE REFILLS 90 tablet 3   diphenhydramine-acetaminophen (TYLENOL PM) 25-500 MG TABS Take 1 tablet by mouth at bedtime as needed (sleep).  furosemide (LASIX) 40 MG tablet TAKE 1 TABLET BY MOUTH EVERY DAY 90 tablet 3   glimepiride (AMARYL) 2 MG tablet TAKE 1 TABLET BY MOUTH EVERY DAY WITH BREAKFAST 30 tablet 0   irbesartan (AVAPRO) 75 MG tablet TAKE 1 TABLET BY MOUTH EVERY DAY 90 tablet 1   Lancets (ONETOUCH DELICA PLUS [id number]) MISC USE AS DIRECTED TO TEST DAILY 100 each 12   meclizine (ANTIVERT) 12.5 MG tablet Take 1 tablet (12.5 mg total) by mouth 3 (three) times daily as needed for dizziness. As needed for   Dizziness or nausea 30 tablet 1   metFORMIN (GLUCOPHAGE) 500 MG tablet TAKE 1 TABLET BY MOUTH 2 TIMES DAILY WITH A MEAL. 180 tablet 0   methocarbamol (ROBAXIN) 500 MG tablet Take 1 tablet (500 mg total) by mouth 4 (four) times daily. 45 tablet 1   nystatin cream (MYCOSTATIN) Apply 1 application topically 2 (two) times daily. 30 g 1   ONETOUCH VERIO test strip CEHCK BLOOD SUGAR DAILY AS NEEDED 100 strip 12   Vitamin D, Ergocalciferol, (DRISDOL) 1.25 MG (50000 UNIT) CAPS capsule Take 1 capsule (50,000 Units total) by mouth every 7 (seven) days. 12 capsule 1   hyoscyamine (LEVSIN SL) 0.125 MG SL tablet Place 1 tablet (0.125 mg total) under the tongue every 6 (six) hours as needed. 30 tablet 0   Calcium Carb-Cholecalciferol (CALCIUM 600 + D PO) Take 1 tablet by mouth daily. (Patient not taking: No sig reported)     cephALEXin (KEFLEX) 500 MG capsule Take 1 capsule (500 mg total) by mouth 2 (two) times daily. 14 capsule 0   No facility-administered medications prior to visit.    No Known Allergies  Review of Systems  Constitutional:  Negative for chills, fever and malaise/fatigue.  HENT:  Negative for  congestion, hearing loss and sinus pain.   Eyes:  Negative for blurred vision, pain and redness.  Respiratory:  Negative for cough, sputum production and shortness of breath.   Cardiovascular:  Negative for chest pain, palpitations and leg swelling.  Gastrointestinal:  Negative for abdominal pain, blood in stool and nausea.  Genitourinary:  Negative for dysuria, frequency and urgency.  Musculoskeletal:  Negative for back pain, falls and myalgias.  Neurological:  Negative for dizziness, weakness and headaches.  Psychiatric/Behavioral:  Negative for depression. The patient is not nervous/anxious and does not have insomnia.       Objective:    Physical Exam Constitutional:      Appearance: Normal appearance. She is not ill-appearing or toxic-appearing.  HENT:     Head: Normocephalic and atraumatic.     Right Ear: Tympanic membrane and external ear normal.     Left Ear: Tympanic membrane and external ear normal.  Eyes:     General: No scleral icterus.    Extraocular Movements: Extraocular movements intact.     Pupils: Pupils are equal, round, and reactive to light.  Cardiovascular:     Rate and Rhythm: Regular rhythm.     Pulses: Normal pulses.     Heart sounds: Normal heart sounds. No murmur heard. Pulmonary:     Effort: Pulmonary effort is normal.     Breath sounds: Normal breath sounds.  Abdominal:     General: Abdomen is flat.     Palpations: Abdomen is soft. There is no mass.     Tenderness: There is no right CVA tenderness or left CVA tenderness.  Musculoskeletal:     Cervical back: Normal range of motion and neck supple.  Lymphadenopathy:  Cervical: No cervical adenopathy.  Skin:    General: Skin is warm and dry.  Neurological:     General: No focal deficit present.     Mental Status: She is alert and oriented to person, place, and time.     Deep Tendon Reflexes: Reflexes normal.  Psychiatric:        Mood and Affect: Mood normal.        Behavior: Behavior normal.     BP (!) 126/50   Pulse 67   Temp 98.1 F (36.7 C)   Resp 16   Wt 218 lb (98.9 kg)   SpO2 96%   BMI 37.42 kg/m  Wt Readings from Last 3 Encounters:  10/14/20 218 lb (98.9 kg)  09/23/20 216 lb 6.4 oz (98.2 kg)  09/09/20 216 lb 12.8 oz (98.3 kg)    Diabetic Foot Exam - Simple   No data filed    Lab Results  Component Value Date   WBC 7.3 09/15/2020   HGB 11.6 (L) 09/15/2020   HCT 36.0 09/15/2020   PLT 151 09/15/2020   GLUCOSE 144 (H) 09/21/2020   CHOL 193 09/30/2019   TRIG 267 (H) 09/30/2019   HDL 52.00 08/05/2019   LDLDIRECT 71.0 08/05/2019   LDLCALC (H) 01/31/2007    117        Total Cholesterol/HDL:CHD Risk Coronary Heart Disease Risk Table                     Men   Women  1/2 Average Risk   3.4   3.3  Average Risk       5.0   4.4  2 X Average Risk   9.6   7.1  3 X Average Risk  23.4   11.0        Use the calculated Patient Ratio above and the CHD Risk Table to determine the patient's CHD Risk.        ATP III CLASSIFICATION (LDL):  <100     mg/dL   Optimal  100-129  mg/dL   Near or Above                    Optimal  130-159  mg/dL   Borderline  160-189  mg/dL   High  >190     mg/dL   Very High   ALT 29 09/21/2020   AST 34 09/21/2020   NA 136 09/21/2020   K 5.1 09/21/2020   CL 100 09/21/2020   CREATININE 1.95 (H) 09/21/2020   BUN 48 (H) 09/21/2020   CO2 24 09/21/2020   TSH 2.40 08/05/2019   INR 1.1 (H) 09/30/2019   HGBA1C 6.6 (H) 08/05/2019    Lab Results  Component Value Date   TSH 2.40 08/05/2019   Lab Results  Component Value Date   WBC 7.3 09/15/2020   HGB 11.6 (L) 09/15/2020   HCT 36.0 09/15/2020   MCV 98.4 09/15/2020   PLT 151 09/15/2020   Lab Results  Component Value Date   NA 136 09/21/2020   K 5.1 09/21/2020   CHLORIDE 104 11/01/2016   CO2 24 09/21/2020   GLUCOSE 144 (H) 09/21/2020   BUN 48 (H) 09/21/2020   CREATININE 1.95 (H) 09/21/2020   BILITOT 0.9 09/21/2020   ALKPHOS 66 09/21/2020   AST 34 09/21/2020   ALT 29  09/21/2020   PROT 6.9 09/21/2020   ALBUMIN 4.1 09/21/2020   CALCIUM 10.3 09/21/2020   ANIONGAP 9 09/15/2020   EGFR  50 (L) 11/01/2016   GFR 24.40 (L) 09/21/2020   Lab Results  Component Value Date   CHOL 193 09/30/2019   Lab Results  Component Value Date   HDL 52.00 08/05/2019   Lab Results  Component Value Date   LDLCALC (H) 01/31/2007    117        Total Cholesterol/HDL:CHD Risk Coronary Heart Disease Risk Table                     Men   Women  1/2 Average Risk   3.4   3.3  Average Risk       5.0   4.4  2 X Average Risk   9.6   7.1  3 X Average Risk  23.4   11.0        Use the calculated Patient Ratio above and the CHD Risk Table to determine the patient's CHD Risk.        ATP III CLASSIFICATION (LDL):  <100     mg/dL   Optimal  100-129  mg/dL   Near or Above                    Optimal  130-159  mg/dL   Borderline  160-189  mg/dL   High  >190     mg/dL   Very High   Lab Results  Component Value Date   TRIG 267 (H) 09/30/2019   Lab Results  Component Value Date   CHOLHDL 4 08/05/2019   Lab Results  Component Value Date   HGBA1C 6.6 (H) 08/05/2019       Assessment & Plan:   Problem List Items Addressed This Visit     Morbid obesity ([place]) (Chronic)   Hyperlipidemia - Primary    Encourage heart healthy diet such as MIND or DASH diet, increase exercise, avoid trans fats, simple carbohydrates and processed foods, consider a krill or fish or flaxseed oil cap daily. Tolerating Atorvastatin      Relevant Orders   Lipid panel   Low serum vitamin D    Supplement and monitor      Dermatitis    Under breast and recurrent. Nystatin is partially helpful. Is given a prescription for Diflucan to try for yeast over growth to try. Continue to keep area clean and dry and add [place] to the cleansing regimen.       Gout    Hydrate and monitor      Diarrhea    Intermittent with cramping and incontinence at times. Given refill on Hyoscyamine  which is helpful in those moments. She is encouraged to increase fiber to bid and to f/u with GI if persistent      DM (diabetes mellitus), type 2 ([facility])    hgba1c acceptable, minimize simple carbs. Increase exercise as tolerated. Continue current meds      Relevant Orders   Hemoglobin A1c   TSH   Lipid panel   UTI (urinary tract infection)    Recent UTI will repeat UA and culture today      Relevant Medications   fluconazole (DIFLUCAN) 150 MG tablet   Coronary artery disease involving native coronary artery of native heart without angina pectoris   Relevant Orders   TSH   CBC with Differential/Platelet   Chronic systolic heart failure (H[facility])    No recent exacerbation      Renal insufficiency    Hydrate and monitor      Relevant Orders  Comprehensive metabolic panel   Hyperkalemia    She has had a recent spike in potassium and she drinks propel every day, she is asked to stop drinking [place] we will monitor      Urinary incontinence   Other Visit Diagnoses     Urine culture positive       Relevant Orders   Urinalysis   Urine Culture   Muscle cramps       Relevant Orders   Magnesium       F/U in 4-6 months CPE    I, Penni Homans, MD, personally preformed the services described in this documentation.  All medical record entries made by the scribe were at my direction and in my presence.  I have reviewed the chart and discharge instructions (if applicable) and agree that the record reflects my personal performance and is accurate and complete. Penni Homans 10/14/2020    I,Jada Bradford,acting as a scribe for Penni Homans, MD.,have documented all relevant documentation on the behalf of Penni Homans, MD,as directed by  Penni Homans, MD while in the presence of Penni Homans, MD.  I, [name], MD personally performed the services described in this documentation. All medical record entries made by the scribe were at my direction and in my presence. I have  reviewed the chart and agree that the record reflects my personal performance and is accurate and complete    Penni Homans, MD

## 2020-10-14 NOTE — Assessment & Plan Note (Signed)
Hydrate and monitor 

## 2020-10-14 NOTE — Patient Instructions (Addendum)
Do not take Atorvastatin the day you take the Diflucan/Fluconazole Increase Benefiber to twice daily with water behind Call Dr Bryan Lemma about your ongoing stomach concerns Body Ringworm Body ringworm is an infection of the skin that often causes a ring-shaped rash. Body ringworm is also called tinea corporis. Body ringworm can affect any part of your skin. This condition is easily spread from person to person (is very contagious). What are the causes? This condition is caused by fungi called dermatophytes. The condition develops when these fungi grow out of control on the skin. You can get this condition if you touch a person or animal that has it. You can also get it if you share any items with an infected person or pet. These include: Clothing, bedding, and towels. Brushes or combs. Gym equipment. Any other object that has the fungus on it. What increases the risk? You are more likely to develop this condition if you: Play sports that involve close physical contact, such as wrestling. Sweat a lot. Live in areas that are hot and humid. Use public showers. Have a weakened immune system. What are the signs or symptoms? Symptoms of this condition include: Itchy, raised red spots and bumps. Red scaly patches. A ring-shaped rash. The rash may have: A clear center. Scales or red bumps at its center. Redness near its borders. Dry and scaly skin on or around it. How is this diagnosed? This condition can usually be diagnosed with a skin exam. A skin scraping may be taken from the affected area and examined under a microscope to see if the fungus is present. How is this treated? This condition may be treated with: An antifungal cream or ointment. An antifungal shampoo. Antifungal medicines. These may be prescribed if your ringworm: Is severe. Keeps coming back. Lasts a long time. Follow these instructions at home: Take over-the-counter and prescription medicines only as told by your  health care provider. If you were given an antifungal cream or ointment: Use it as told by your health care provider. Wash the infected area and dry it completely before applying the cream or ointment. If you were given an antifungal shampoo: Use it as told by your health care provider. Leave the shampoo on your body for 3-5 minutes before rinsing. While you have a rash: Wear loose clothing to stop clothes from rubbing and irritating it. Wash or change your bed sheets every night. Disinfect or throw out items that may be infected. Wash clothes and bed sheets in hot water. Wash your hands often with soap and water. If soap and water are not available, use hand sanitizer. If your pet has the same infection, take your pet to see a veterinarian for treatment. How is this prevented? Take a bath or shower every day and after every time you work out or play sports. Dry your skin completely after bathing. Wear sandals or shoes in public places and showers. Change your clothes every day. Wash athletic clothes after each use. Do not share personal items with others. Avoid touching red patches of skin on other people. Avoid touching pets that have bald spots. If you touch an animal that has a bald spot, wash your hands. Contact a health care provider if: Your rash continues to spread after 7 days of treatment. Your rash is not gone in 4 weeks. The area around your rash gets red, warm, tender, and swollen. Summary Body ringworm is an infection of the skin that often causes a ring-shaped rash. This condition is easily  spread from person to person (is very contagious). This condition may be treated with antifungal cream or ointment, antifungal shampoo, or antifungal medicines. Take over-the-counter and prescription medicines only as told by your health care provider. This information is not intended to replace advice given to you by your health care provider. Make sure you discuss any questions  you have with your health care provider. Document Revised: 09/14/2017 Document Reviewed: 09/14/2017 Elsevier Patient Education  [place].

## 2020-10-14 NOTE — Assessment & Plan Note (Signed)
Recent UTI will repeat UA and culture today

## 2020-10-14 NOTE — Assessment & Plan Note (Signed)
No recent exacerbation 

## 2020-10-14 NOTE — Assessment & Plan Note (Signed)
She has had a recent spike in potassium and she drinks propel every day, she is asked to stop drinking [place] we will monitor

## 2020-10-14 NOTE — Assessment & Plan Note (Signed)
Encourage heart healthy diet such as MIND or DASH diet, increase exercise, avoid trans fats, simple carbohydrates and processed foods, consider a krill or fish or flaxseed oil cap daily. Tolerating Atorvastatin

## 2020-10-14 NOTE — Assessment & Plan Note (Signed)
Supplement and monitor 

## 2020-10-14 NOTE — Assessment & Plan Note (Signed)
hgba1c acceptable, minimize simple carbs. Increase exercise as tolerated. Continue current meds 

## 2020-10-15 LAB — COMPREHENSIVE METABOLIC PANEL
ALT: 32 U/L (ref 0–35)
AST: 33 U/L (ref 0–37)
Albumin: 4.3 g/dL (ref 3.5–5.2)
Alkaline Phosphatase: 59 U/L (ref 39–117)
BUN: 37 mg/dL — ABNORMAL HIGH (ref 6–23)
CO2: 25 mEq/L (ref 19–32)
Calcium: 10.9 mg/dL — ABNORMAL HIGH (ref 8.4–10.5)
Chloride: 105 mEq/L (ref 96–112)
Creatinine, Ser: 1.56 mg/dL — ABNORMAL HIGH (ref 0.40–1.20)
GFR: 31.88 mL/min — ABNORMAL LOW (ref >60.00)
Glucose, Bld: 66 mg/dL — ABNORMAL LOW (ref 70–99)
Potassium: 5.1 mEq/L (ref 3.5–5.1)
Sodium: 139 mEq/L (ref 135–145)
Total Bilirubin: 0.7 mg/dL (ref 0.2–1.2)
Total Protein: 7.5 g/dL (ref 6.0–8.3)

## 2020-10-15 LAB — LDL CHOLESTEROL, DIRECT: Direct LDL: 74 mg/dL

## 2020-10-15 LAB — CBC WITH DIFFERENTIAL/PLATELET
Basophils Absolute: 0.1 10*3/uL (ref 0.0–0.1)
Basophils Relative: 0.8 % (ref 0.0–3.0)
Eosinophils Absolute: 0.2 10*3/uL (ref 0.0–0.7)
Eosinophils Relative: 2.3 % (ref 0.0–5.0)
HCT: 37 % (ref 36.0–46.0)
Hemoglobin: 12 g/dL (ref 12.0–15.0)
Lymphocytes Relative: 27.8 % (ref 12.0–46.0)
Lymphs Abs: 2.1 10*3/uL (ref 0.7–4.0)
MCHC: 32.5 g/dL (ref 30.0–36.0)
MCV: 98.5 fl (ref 78.0–100.0)
Monocytes Absolute: 0.7 10*3/uL (ref 0.1–1.0)
Monocytes Relative: 9 % (ref 3.0–12.0)
Neutro Abs: 4.6 10*3/uL (ref 1.4–7.7)
Neutrophils Relative %: 60.1 % (ref 43.0–77.0)
Platelets: 158 10*3/uL (ref 150.0–400.0)
RBC: 3.76 Mil/uL — ABNORMAL LOW (ref 3.87–5.11)
RDW: 16.4 % — ABNORMAL HIGH (ref 11.5–15.5)
WBC: 7.6 10*3/uL (ref 4.0–10.5)

## 2020-10-15 LAB — LIPID PANEL
Cholesterol: 177 mg/dL (ref 0–200)
HDL: 51.7 mg/dL (ref >39.00)
NonHDL: 125.37
Total CHOL/HDL Ratio: 3
Triglycerides: 303 mg/dL — ABNORMAL HIGH (ref 0.0–149.0)
VLDL: 60.6 mg/dL — ABNORMAL HIGH (ref 0.0–40.0)

## 2020-10-15 LAB — URINALYSIS
Bilirubin Urine: NEGATIVE
Hgb urine dipstick: NEGATIVE
Ketones, ur: NEGATIVE
Leukocytes,Ua: NEGATIVE
Nitrite: NEGATIVE
Specific Gravity, Urine: 1.015 (ref 1.000–1.030)
Total Protein, Urine: NEGATIVE
Urine Glucose: NEGATIVE
Urobilinogen, UA: 0.2 (ref 0.0–1.0)
pH: 6 (ref 5.0–8.0)

## 2020-10-15 LAB — URINE CULTURE
MICRO NUMBER:: 12379068
SPECIMEN QUALITY:: ADEQUATE

## 2020-10-15 LAB — HEMOGLOBIN A1C: Hgb A1c MFr Bld: 6.7 % — ABNORMAL HIGH (ref 4.6–6.5)

## 2020-10-15 LAB — TSH: TSH: 2.31 u[IU]/mL (ref 0.35–5.50)

## 2020-10-15 NOTE — Assessment & Plan Note (Signed)
Under breast and recurrent. Nystatin is partially helpful. Is given a prescription for Diflucan to try for yeast over growth to try. Continue to keep area clean and dry and add [place] to the cleansing regimen.

## 2020-10-15 NOTE — Assessment & Plan Note (Signed)
Intermittent with cramping and incontinence at times. Given refill on Hyoscyamine which is helpful in those moments. She is encouraged to increase fiber to bid and to f/u with GI if persistent

## 2020-10-18 ENCOUNTER — Other Ambulatory Visit: Payer: Self-pay

## 2020-10-18 MED ORDER — NYSTATIN 100000 UNIT/GM EX CREA: 1.0000 "application " | TOPICAL_CREAM | Freq: Two times a day (BID) | CUTANEOUS | 1 refills | Status: DC

## 2020-10-28 ENCOUNTER — Other Ambulatory Visit: Payer: Self-pay | Admitting: Family Medicine

## 2020-11-12 ENCOUNTER — Other Ambulatory Visit: Payer: Self-pay | Admitting: Family Medicine

## 2020-11-18 ENCOUNTER — Other Ambulatory Visit: Payer: Self-pay | Admitting: Family Medicine

## 2020-11-20 ENCOUNTER — Other Ambulatory Visit: Payer: Self-pay | Admitting: Internal Medicine

## 2020-11-20 ENCOUNTER — Other Ambulatory Visit: Payer: Self-pay | Admitting: Family Medicine

## 2020-12-05 DIAGNOSIS — Z9049 Acquired absence of other specified parts of digestive tract: Secondary | ICD-10-CM | POA: Diagnosis not present

## 2020-12-05 DIAGNOSIS — Z20822 Contact with and (suspected) exposure to covid-19: Secondary | ICD-10-CM | POA: Diagnosis not present

## 2020-12-05 DIAGNOSIS — N2889 Other specified disorders of kidney and ureter: Secondary | ICD-10-CM | POA: Diagnosis not present

## 2020-12-05 DIAGNOSIS — R42 Dizziness and giddiness: Secondary | ICD-10-CM | POA: Diagnosis not present

## 2020-12-05 DIAGNOSIS — K5730 Diverticulosis of large intestine without perforation or abscess without bleeding: Secondary | ICD-10-CM | POA: Diagnosis not present

## 2020-12-05 DIAGNOSIS — R112 Nausea with vomiting, unspecified: Secondary | ICD-10-CM | POA: Diagnosis not present

## 2020-12-05 DIAGNOSIS — R0602 Shortness of breath: Secondary | ICD-10-CM | POA: Diagnosis not present

## 2020-12-05 DIAGNOSIS — J986 Disorders of diaphragm: Secondary | ICD-10-CM | POA: Diagnosis not present

## 2020-12-05 DIAGNOSIS — R1013 Epigastric pain: Secondary | ICD-10-CM | POA: Diagnosis not present

## 2020-12-05 DIAGNOSIS — K449 Diaphragmatic hernia without obstruction or gangrene: Secondary | ICD-10-CM | POA: Diagnosis not present

## 2020-12-05 DIAGNOSIS — Z7984 Long term (current) use of oral hypoglycemic drugs: Secondary | ICD-10-CM | POA: Diagnosis not present

## 2020-12-05 DIAGNOSIS — Z79899 Other long term (current) drug therapy: Secondary | ICD-10-CM | POA: Diagnosis not present

## 2020-12-05 DIAGNOSIS — R197 Diarrhea, unspecified: Secondary | ICD-10-CM | POA: Diagnosis not present

## 2020-12-05 DIAGNOSIS — R079 Chest pain, unspecified: Secondary | ICD-10-CM | POA: Diagnosis not present

## 2020-12-05 DIAGNOSIS — I447 Left bundle-branch block, unspecified: Secondary | ICD-10-CM | POA: Diagnosis not present

## 2020-12-06 DIAGNOSIS — I447 Left bundle-branch block, unspecified: Secondary | ICD-10-CM | POA: Diagnosis not present

## 2020-12-07 ENCOUNTER — Ambulatory Visit: Payer: Medicare HMO | Admitting: General Practice

## 2020-12-07 NOTE — Progress Notes (Deleted)
Cardiology Clinic Note   Patient Name: Amanda Crawford Date of Encounter: 12/07/2020  Primary Care Provider:  [name], MD Primary Cardiologist:  Pixie Casino, MD  Patient Profile    Amanda Crawford 75 year old female presents the clinic today for follow-up evaluation of her chronic systolic CHF and essential hypertension.  Past Medical History    Past Medical History:  Diagnosis Date   Allergic state 03/12/2015   Arthritis    Colon polyp 11/19/2014   Dermatitis 03/12/2015   Diarrhea 06/29/2016   Diverticulitis    DM (diabetes mellitus), type 2 ([place]) 06/29/2016   GERD (gastroesophageal reflux disease)    occ   Gout 03/12/2015   H/O measles    H/O mumps    Headache(784.0)    migraines   History of chicken pox 11/23/2014   Hyperglycemia 06/29/2016   Lung cancer ([place])    Migraine 11/23/2014   Obesity 11/23/2014   Pneumonia    child   Preventative health care 09/12/2015   Renal cell cancer ([place])    renal cell ca dx 9/01 and 8/08;   Renal insufficiency    Shortness of breath    occ   Skin lesion of breast 03/12/2015   Past Surgical History:  Procedure Laterality Date   ABDOMINAL HYSTERECTOMY     APPENDECTOMY     CARDIAC CATHETERIZATION     yrs ago neg   CHOLECYSTECTOMY     COLONOSCOPY  2018   ESOPHAGOGASTRODUODENOSCOPY     years ago   IR RADIOLOGIST EVAL & MGMT  07/17/2018   IR RADIOLOGIST EVAL & MGMT  09/12/2018   IR RADIOLOGIST EVAL & MGMT  06/18/2019   IR RADIOLOGIST EVAL & MGMT  01/13/2020   IR RADIOLOGIST EVAL & MGMT  07/08/2020   IR RADIOLOGIST EVAL & MGMT  10/12/2020   KIDNEY SURGERY  2001   Removed of left kidney    LUNG CANCER SURGERY Left 08   RADIOLOGY WITH ANESTHESIA N/A 08/21/2018   Procedure: CT WITH ANESTHESIA RENAL CRYOABLATION;  Surgeon: Arne Cleveland, MD;  Location: [facility];  Service: Radiology;  Laterality: N/A;   RENAL MASS EXCISION Left 01   RIGHT/LEFT HEART CATH AND CORONARY ANGIOGRAPHY N/A 07/27/2016   Procedure: Right/Left Heart  Cath and Coronary Angiography;  Surgeon: Leonie Man, MD;  Location: [facility];  Service: Cardiovascular;  Laterality: N/A;   THORACOTOMY Left 05/09/2012   Procedure: THORACOTOMY MAJOR;  Surgeon: Gaye Pollack, MD;  Location: [facility];  Service: Thoracic;  Laterality: Left;   VIDEO BRONCHOSCOPY N/A 05/09/2012   Procedure: VIDEO BRONCHOSCOPY;  Surgeon: Gaye Pollack, MD;  Location: [facility];  Service: Thoracic;  Laterality: N/A;   WEDGE RESECTION Left 05/09/2012   Procedure: LEFT UPPER LOBE WEDGE RESECTION;  Surgeon: Gaye Pollack, MD;  Location: [facility];  Service: Thoracic;  Laterality: Left;    Allergies  No Known Allergies  History of Present Illness    [name] has a PMH of chronic systolic and diastolic CHF, LBBB, and nonischemic cardiomyopathy, essential hypertension, type 2 diabetes, GERD, migraine headaches, HLD, lung cancer, and renal cell cancer.  Echocardiogram 2019 showed an EF of 45-50%.  She was noted to have a abnormal EKG with LBBB dating back to 2008.  She presented with chest pain which radiated to her back and was found to have an abnormal EKG which led to cardiac catheterization 1/09.  She was noted to have normal coronary arteries.  She was  later found to have renal cell carcinoma and underwent nephrectomy.  She was ultimately found to have recurrent metastatic renal disease and required to partial lobectomies 2014.  She was initially seen by Dr. Debara Pickett at the request of her PCP due to shortness of breath.  She reported that she was fatigued easier and had difficulty walking up stairs and laying flat.  She reported a mild nonproductive cough and slight wheezing.  Her PCP had ordered an echocardiogram 6/18 which showed newly decreased EF from 50% down to 35-40%.  She was noted to have LVH and mild diastolic dysfunction as well as mild mitral regurgitation.  She was seen in follow-up 11/08/2016.  During that time she reported some improvement with her heart failure  symptoms.  Her diuresis has been increased to 40 mg daily.  Carvedilol 6.25 mg twice daily was added to her medication regimen.  She was on low-dose due to her solitary kidney.  She was again seen in follow-up 07/30/2017.  During that time she was tolerating low-dose losartan well.  Her echocardiogram showed slightly improved LVEF.  4/19 echocardiogram showed an EF of 45-50%.  CRT-P therapy had been considered however since her LVEF had improved it was felt that was not necessary.  Her blood pressure was 106/58 and she reported that at home it would run around 366-440 systolic.  She continued to do well without any increased shortness of breath or chest discomfort.  She was seen by Dr. Debara Pickett 03/04/2020.  During that time she continued to do well.  She was NYHA class I-2.  Her blood pressure continued to be well controlled.  Her blood pressure would not allow increase in her losartan and her repeat echocardiogram 03/29/2020 showed an LVEF of 55-60%, moderate concentric LVH, and trivial mitral valve regurgitation.  She presented to the emergency department [facility] at [facility].  She complained of vomiting and diarrhea that had started the Wednesday prior.  She noted abdominal cramping.  She shared concern for dehydration due to decreased oral intake.  Her blood pressure at that time was 132/58.  She reported that she felt somewhat lightheaded.  At the time of her exam she reported that her loose stools had resolved.  She denied any abdominal pain.  She reported continued nausea and vomiting.  She reported tolerating some p.o. but reported that she did not feel well.  She denied chest pain and increased shortness of breath her BUN was 31 with creatinine of 1.54.  Her potassium was 5.0.  Her UA was negative.  Her flu swab was negative for AB and RSV.  COVID was also not detected.  A CT of her abdomen pelvis showed diverticulosis without diverticulitis cardiomegaly, and patchy opacities in her right lung base which  were felt to be representative of atelectasis or infiltrates.  She was prescribed Zofran 4 mg p.o. every 8 hours as needed for nausea and discharged in stable condition 12/06/2020 12:47 AM.  Her EKG showed normal sinus rhythm with left bundle branch block.  She presents the clinic today for follow-up evaluation states***  *** denies chest pain, shortness of breath, lower extremity edema, fatigue, palpitations, melena, hematuria, hemoptysis, diaphoresis, weakness, presyncope, syncope, orthopnea, and PND.   Home Medications    Prior to Admission medications   Medication Sig Start Date End Date Taking? Authorizing Provider  allopurinol (ZYLOPRIM) 300 MG tablet TAKE 1 TABLET BY MOUTH EVERY DAY 11/18/20   [name], MD  aspirin EC 81 MG tablet Take 81 mg by  mouth daily.    [provider]  atorvastatin (LIPITOR) 20 MG tablet TAKE 1 TABLET BY MOUTH EVERY DAY 11/12/20   [name], MD  Calcium Carb-Cholecalciferol (CALCIUM 600 + D PO) Take 1 tablet by mouth daily. Patient not taking: No sig reported    [provider]  carvedilol (COREG) 6.25 MG tablet TAKE 1 TABLET (6.25 MG TOTAL) BY MOUTH 2 (TWO) TIMES DAILY. PT NEEDS AN OV FOR FUTURE REFILLS 11/22/20   Hilty, Nadean Corwin, MD  diphenhydramine-acetaminophen (TYLENOL PM) 25-500 MG TABS Take 1 tablet by mouth at bedtime as needed (sleep).     [provider]  fluconazole (DIFLUCAN) 150 MG tablet Take 1 tablet (150 mg total) by mouth once a week. 10/14/20   [name], MD  furosemide (LASIX) 40 MG tablet TAKE 1 TABLET BY MOUTH EVERY DAY 05/03/20   Hilty, Nadean Corwin, MD  glimepiride (AMARYL) 2 MG tablet TAKE 1 TABLET BY MOUTH EVERY DAY WITH BREAKFAST 10/28/20   [name], MD  hyoscyamine (LEVSIN SL) 0.125 MG SL tablet Place 1 tablet (0.125 mg total) under the tongue every 6 (six) hours as needed. 10/14/20   [name], MD  irbesartan (AVAPRO) 75 MG tablet TAKE 1 TABLET BY MOUTH EVERY DAY 08/20/20   Hilty,  Nadean Corwin, MD  Lancets [facility] DELICA PLUS [id number]) MISC USE AS DIRECTED TO TEST DAILY 07/21/20   [name], MD  meclizine (ANTIVERT) 12.5 MG tablet Take 1 tablet (12.5 mg total) by mouth 3 (three) times daily as needed for dizziness. As needed for   Dizziness or nausea 11/23/14   [name], MD  metFORMIN (GLUCOPHAGE) 500 MG tablet TAKE 1 TABLET BY MOUTH 2 TIMES DAILY WITH A MEAL. 11/22/20   [name], MD  methocarbamol (ROBAXIN) 500 MG tablet Take 1 tablet (500 mg total) by mouth 4 (four) times daily. 09/09/20   Ann Held, DO  nystatin cream (MYCOSTATIN) Apply 1 application topically 2 (two) times daily. 10/18/20   [name], MD  ONETOUCH VERIO test strip [facility] BLOOD SUGAR DAILY AS NEEDED 02/17/20   [name], MD  Vitamin D, Ergocalciferol, (DRISDOL) 1.25 MG (50000 UNIT) CAPS capsule TAKE 1 CAPSULE (50,000 UNITS TOTAL) BY MOUTH EVERY 7 (SEVEN) DAYS 11/22/20   [name], MD  glyBURIDE-metformin [facility]) 1.25-250 MG tablet Take 1 tablet by mouth 2 (two) times daily with a meal. 03/25/19 03/25/19  [name], MD    Family History    Family History  Problem Relation Age of Onset   Dementia Mother    Heart failure Father    Diabetes Father    Hyperlipidemia Father    Heart disease Father    Hypertension Father    Asthma Brother    Asthma Daughter    Alcohol abuse Maternal Aunt    Colon cancer Paternal Aunt    Cancer Paternal Grandmother    Hearing loss Paternal Grandfather    Stroke Paternal Grandfather    She indicated that her mother is deceased. She indicated that her father is deceased. She indicated that her sister is alive. She indicated that her brother is alive. She indicated that her maternal grandmother is deceased. She indicated that her maternal grandfather is deceased. She indicated that her paternal grandmother is deceased. She indicated that her paternal grandfather is deceased. She indicated that her daughter is alive.  She indicated that her son is alive. She indicated that the status of  her maternal aunt is unknown. She indicated that the status of her paternal aunt is unknown.  Social History    Social History   Socioeconomic History   Marital status: Married    Spouse name: Not on file   Number of children: Not on file   Years of education: Not on file   Highest education level: Not on file  Occupational History   Occupation: retired  Tobacco Use   Smoking status: Former    Packs/day: 0.50    Years: 35.00    Pack years: 17.50    Types: Cigarettes    Quit date: 05/08/2006    Years since quitting: 14.5   Smokeless tobacco: Never  Vaping Use   Vaping Use: Never used  Substance and Sexual Activity   Alcohol use: Yes    Comment: seldomly   Drug use: No   Sexual activity: Not on file    Comment: lives with husband, no dietary restrictions.   Other Topics Concern   Not on file  Social History Narrative   Not on file   Social Determinants of Health   Financial Resource Strain: Low Risk    Difficulty of Paying Living Expenses: Not hard at all  Food Insecurity: No Food Insecurity   Worried About Charity fundraiser in the Last Year: Never true   Blessing in the Last Year: Never true  Transportation Needs: No Transportation Needs   Lack of Transportation (Medical): No   Lack of Transportation (Non-Medical): No  Physical Activity: Inactive   Days of Exercise per Week: 0 days   Minutes of Exercise per Session: 0 min  Stress: No Stress Concern Present   Feeling of Stress : Only a little  Social Connections: Moderately Integrated   Frequency of Communication with Friends and Family: More than three times a week   Frequency of Social Gatherings with Friends and Family: Once a week   Attends Religious Services: More than 4 times per year   Active Member of [employer] or Organizations: No   Attends Music therapist: Never   Marital Status: Married  Human resources officer Violence:  Not At Risk   Fear of Current or Ex-Partner: No   Emotionally Abused: No   Physically Abused: No   Sexually Abused: No     Review of Systems    General:  No chills, fever, night sweats or weight changes.  Cardiovascular:  No chest pain, dyspnea on exertion, edema, orthopnea, palpitations, paroxysmal nocturnal dyspnea. Dermatological: No rash, lesions/masses Respiratory: No cough, dyspnea Urologic: No hematuria, dysuria Abdominal:   No nausea, vomiting, diarrhea, bright red blood per rectum, melena, or hematemesis Neurologic:  No visual changes, wkns, changes in mental status. All other systems reviewed and are otherwise negative except as noted above.  Physical Exam    VS:  There were no vitals taken for this visit. , BMI There is no height or weight on file to calculate BMI. GEN: Well nourished, well developed, in no acute distress. HEENT: normal. Neck: Supple, no JVD, carotid bruits, or masses. Cardiac: RRR, no murmurs, rubs, or gallops. No clubbing, cyanosis, edema.  Radials/DP/PT 2+ and equal bilaterally.  Respiratory:  Respirations regular and unlabored, clear to auscultation bilaterally. GI: Soft, nontender, nondistended, BS + x 4. MS: no deformity or atrophy. Skin: warm and dry, no rash. Neuro:  Strength and sensation are intact. Psych: Normal affect.  Accessory Clinical Findings    Recent Labs: 10/14/2020: ALT 32; BUN 37; Creatinine, Ser  1.56; Hemoglobin 12.0; Platelets 158.0; Potassium 5.1; Sodium 139; TSH 2.31   Recent Lipid Panel    Component Value Date/Time   CHOL 177 10/14/2020 1628   CHOL 193 09/30/2019 1014   TRIG 303.0 (H) 10/14/2020 1628   TRIG 267 (H) 09/30/2019 1014   HDL 51.70 10/14/2020 1628   CHOLHDL 3 10/14/2020 1628   VLDL 60.6 (H) 10/14/2020 1628   LDLCALC (H) 01/31/2007 0800    117        Total Cholesterol/HDL:CHD Risk Coronary Heart Disease Risk Table                     Men   Women  1/2 Average Risk   3.4   3.3  Average Risk       5.0    4.4  2 X Average Risk   9.6   7.1  3 X Average Risk  23.4   11.0        Use the calculated Patient Ratio above and the CHD Risk Table to determine the patient's CHD Risk.        ATP III CLASSIFICATION (LDL):  <100     mg/dL   Optimal  100-129  mg/dL   Near or Above                    Optimal  130-159  mg/dL   Borderline  160-189  mg/dL   High  >190     mg/dL   Very High   LDLDIRECT 74.0 10/14/2020 1628    ECG personally reviewed by me today- *** - No acute changes  Echocardiogram 03/29/2020 IMPRESSIONS     1. Left ventricular ejection fraction, by estimation, is 55 to 60%. The  left ventricle has normal function. The left ventricle has no regional  wall motion abnormalities. There is moderate concentric left ventricular  hypertrophy. Indeterminate diastolic  filling due to E-A fusion. The average left ventricular global  longitudinal strain is -19.5 %. The global longitudinal strain is normal.   2. Right ventricular systolic function is normal. The right ventricular  size is normal. Tricuspid regurgitation signal is inadequate for assessing  PA pressure.   3. The mitral valve is normal in structure. Trivial mitral valve  regurgitation. No evidence of mitral stenosis.   4. The aortic valve is tricuspid. Aortic valve regurgitation is not  visualized. Mild aortic valve sclerosis is present, with no evidence of  aortic valve stenosis.   5. The inferior vena cava is dilated in size with >50% respiratory  variability, suggesting right atrial pressure of 8 mmHg.   Comparison(s): 05/21/17 45-50%.  Assessment & Plan   1.  Nausea/vomiting-resolved.  Had several days of nausea vomiting and diarrhea that started on 12/01/20.  She presented to the emergency department at [facility] 12/05/2020 and was discharged on 12/06/2020 after receiving a prescriptions for Zofran.  Recent lab work stable. Continue bland diet Continue p.o. hydration Follow-up with PCP  Chronic systolic CHF-no  increased DOE or activity intolerance.  Reports that she continues to be somewhat fatigued after her recent GI upset/viral illness.  Echocardiogram 03/29/2020 showed LVEF 55-60% Continue furosemide, irbesartan, carvedilol Heart healthy low-sodium diet Increase physical activity as tolerated  Nonischemic cardiomyopathy-was noted to have LBB B in 2008 and underwent cardiac catheterization 1/09 which showed normal coronary arteries.  Recovered EF. Continue carvedilol, irbesartan Heart healthy low-sodium diet Increase physical activity as tolerated  Essential hypertension-BP today***.  Continues to be well controlled at  home. Continue carvedilol, irbesartan, Heart healthy low-sodium diet Increase physical activity as tolerated   Disposition: Follow-up with Dr. Debara Pickett in 3-4 months.  Jossie Ng. Sherel Fennell NP-C    12/07/2020, 4:39 AM [address] Office [phone] Fax [phone]  Notice: This dictation was prepared with Dragon dictation along with smaller phrase technology. Any transcriptional errors that result from this process are unintentional and may not be corrected upon review.  I spent***minutes examining this patient, reviewing medications, and using patient centered shared decision making involving her cardiac care.  Prior to her visit I spent greater than 20 minutes reviewing her past medical history,  medications, and prior cardiac tests.

## 2020-12-08 ENCOUNTER — Other Ambulatory Visit: Payer: Self-pay

## 2020-12-08 ENCOUNTER — Ambulatory Visit (INDEPENDENT_AMBULATORY_CARE_PROVIDER_SITE_OTHER): Payer: Medicare HMO | Admitting: Family

## 2020-12-08 VITALS — BP 97/38 | HR 74 | Temp 98.4°F | Resp 16 | Ht 64.0 in | Wt 217.0 lb

## 2020-12-08 DIAGNOSIS — R109 Unspecified abdominal pain: Secondary | ICD-10-CM | POA: Diagnosis not present

## 2020-12-08 DIAGNOSIS — I2510 Atherosclerotic heart disease of native coronary artery without angina pectoris: Secondary | ICD-10-CM

## 2020-12-08 LAB — COMPREHENSIVE METABOLIC PANEL
ALT: 28 U/L (ref 0–35)
AST: 32 U/L (ref 0–37)
Albumin: 4.2 g/dL (ref 3.5–5.2)
Alkaline Phosphatase: 56 U/L (ref 39–117)
BUN: 33 mg/dL — ABNORMAL HIGH (ref 6–23)
CO2: 29 mEq/L (ref 19–32)
Calcium: 10.4 mg/dL (ref 8.4–10.5)
Chloride: 101 mEq/L (ref 96–112)
Creatinine, Ser: 1.51 mg/dL — ABNORMAL HIGH (ref 0.40–1.20)
GFR: 33.12 mL/min — ABNORMAL LOW (ref >60.00)
Glucose, Bld: 162 mg/dL — ABNORMAL HIGH (ref 70–99)
Potassium: 5.3 mEq/L — ABNORMAL HIGH (ref 3.5–5.1)
Sodium: 137 mEq/L (ref 135–145)
Total Bilirubin: 0.8 mg/dL (ref 0.2–1.2)
Total Protein: 6.8 g/dL (ref 6.0–8.3)

## 2020-12-08 LAB — CBC WITH DIFFERENTIAL/PLATELET
Basophils Absolute: 0 10*3/uL (ref 0.0–0.1)
Basophils Relative: 0.7 % (ref 0.0–3.0)
Eosinophils Absolute: 0.2 10*3/uL (ref 0.0–0.7)
Eosinophils Relative: 2.5 % (ref 0.0–5.0)
HCT: 33.5 % — ABNORMAL LOW (ref 36.0–46.0)
Hemoglobin: 10.9 g/dL — ABNORMAL LOW (ref 12.0–15.0)
Lymphocytes Relative: 24.3 % (ref 12.0–46.0)
Lymphs Abs: 1.7 10*3/uL (ref 0.7–4.0)
MCHC: 32.5 g/dL (ref 30.0–36.0)
MCV: 100 fl (ref 78.0–100.0)
Monocytes Absolute: 0.4 10*3/uL (ref 0.1–1.0)
Monocytes Relative: 5.9 % (ref 3.0–12.0)
Neutro Abs: 4.7 10*3/uL (ref 1.4–7.7)
Neutrophils Relative %: 66.6 % (ref 43.0–77.0)
Platelets: 171 10*3/uL (ref 150.0–400.0)
RBC: 3.35 Mil/uL — ABNORMAL LOW (ref 3.87–5.11)
RDW: 16.3 % — ABNORMAL HIGH (ref 11.5–15.5)
WBC: 7 10*3/uL (ref 4.0–10.5)

## 2020-12-08 LAB — LIPASE: Lipase: 106 U/L — ABNORMAL HIGH (ref 11.0–59.0)

## 2020-12-08 MED ORDER — CARVEDILOL 6.25 MG PO TABS: 3.1250 mg | ORAL_TABLET | Freq: Two times a day (BID) | ORAL | 1 refills | Status: DC

## 2020-12-08 NOTE — Assessment & Plan Note (Signed)
Resolved.  I would like to repeat her lab work including lipase.  Suspect she had an acute viral gastroenteritis.

## 2020-12-08 NOTE — Progress Notes (Addendum)
Subjective:   By signing my name below, I, Shehryar Baig, attest that this documentation has been prepared under the direction and in the presence of Debbrah Alar NP. 12/01/2020    Patient ID: Amanda Crawford, female    DOB: May 09, 1945, 75 y.o.   MRN: [id number]  Chief Complaint  Patient presents with   Follow-up    Follow up after er visit    HPI Patient is in today for a ER follow up.  Patient is a 75 year old female with hx of metastatic renal cell carcinoma, and CHF, who presents today for ED follow-up.  She presented to the [facility] ED on 12/05/2020 with complaint of abdominal pain, nausea, vomiting, and diarrhea.  Work-up included the following:  Normal BNP Negative D-dimer Elevated Genfiber cardiac troponin Negative flu A, flu B, and COVID testing Negative GI stool panel Unremarkable urinalysis CBC noting mild macrocytic anemia with a hemoglobin of 11 Complete metabolic panel noting mild elevation of calcium and creatinine of 1.54.  In addition AST was mildly elevated at 42. Lipase was also elevated at 94. Head CT was performed which noted no acute changes Chest x-ray noted medial elevated right hemidiaphragm otherwise unremarkable CT abdomen pelvis-noted patchy right basilar opacities, diverticulosis without diverticulitis, unchanged cortical scarring in the right kidney midpole, otherwise unremarkable. Patient thought she was experiencing a stomach virus at the time of being admitted. She was given Zofran on discharge. She was not given any anti-biotics. Patient denies having any cough at this time. She reports having improvement in abdominal pain but continues having pain with heavy foods. She is on a blander diet to manage it at this time.   Cariology- She reports that she missed her appointment with her cardiologist on 12/05/2020 due to a misunderstanding on check-in. She has another upcoming appointment scheduled in December, 2023. She denies having any chest  pain at this time. She reports her paternal family has a history of heart problems.  Immunizations- She is received a Flu vaccine and a bivalent Covid-19 vaccine 2 weeks ago.    Health Maintenance Due  Topic Date Due   FOOT EXAM  Never done   INFLUENZA VACCINE  08/30/2020   COVID-19 Vaccine (5 - Booster for Pfizer series) 09/21/2020    Past Medical History:  Diagnosis Date   Allergic state 03/12/2015   Arthritis    Colon polyp 11/19/2014   Dermatitis 03/12/2015   Diarrhea 06/29/2016   Diverticulitis    DM (diabetes mellitus), type 2 ([place]) 06/29/2016   GERD (gastroesophageal reflux disease)    occ   Gout 03/12/2015   H/O measles    H/O mumps    Headache(784.0)    migraines   History of chicken pox 11/23/2014   Hyperglycemia 06/29/2016   Lung cancer ([place])    Migraine 11/23/2014   Obesity 11/23/2014   Pneumonia    child   Preventative health care 09/12/2015   Renal cell cancer ([place])    renal cell ca dx 9/01 and 8/08;   Renal insufficiency    Shortness of breath    occ   Skin lesion of breast 03/12/2015    Past Surgical History:  Procedure Laterality Date   ABDOMINAL HYSTERECTOMY     APPENDECTOMY     CARDIAC CATHETERIZATION     yrs ago neg   CHOLECYSTECTOMY     COLONOSCOPY  2018   ESOPHAGOGASTRODUODENOSCOPY     years ago   IR RADIOLOGIST EVAL & MGMT  07/17/2018   IR RADIOLOGIST  EVAL & MGMT  09/12/2018   IR RADIOLOGIST EVAL & MGMT  06/18/2019   IR RADIOLOGIST EVAL & MGMT  01/13/2020   IR RADIOLOGIST EVAL & MGMT  07/08/2020   IR RADIOLOGIST EVAL & MGMT  10/12/2020   KIDNEY SURGERY  2001   Removed of left kidney    LUNG CANCER SURGERY Left 08   RADIOLOGY WITH ANESTHESIA N/A 08/21/2018   Procedure: CT WITH ANESTHESIA RENAL CRYOABLATION;  Surgeon: Arne Cleveland, MD;  Location: [facility];  Service: Radiology;  Laterality: N/A;   RENAL MASS EXCISION Left 01   RIGHT/LEFT HEART CATH AND CORONARY ANGIOGRAPHY N/A 07/27/2016   Procedure: Right/Left Heart Cath and Coronary  Angiography;  Surgeon: Leonie Man, MD;  Location: [place] [facility];  Service: Cardiovascular;  Laterality: N/A;   THORACOTOMY Left 05/09/2012   Procedure: THORACOTOMY MAJOR;  Surgeon: Gaye Pollack, MD;  Location: [facility];  Service: Thoracic;  Laterality: Left;   VIDEO BRONCHOSCOPY N/A 05/09/2012   Procedure: VIDEO BRONCHOSCOPY;  Surgeon: Gaye Pollack, MD;  Location: [facility];  Service: Thoracic;  Laterality: N/A;   WEDGE RESECTION Left 05/09/2012   Procedure: LEFT UPPER LOBE WEDGE RESECTION;  Surgeon: Gaye Pollack, MD;  Location: [place];  Service: Thoracic;  Laterality: Left;    Family History  Problem Relation Age of Onset   Dementia Mother    Heart failure Father    Diabetes Father    Hyperlipidemia Father    Heart disease Father    Hypertension Father    Asthma Brother    Asthma Daughter    Alcohol abuse Maternal Aunt    Colon cancer Paternal Aunt    Cancer Paternal Grandmother    Hearing loss Paternal Grandfather    Stroke Paternal Grandfather     Social History   Socioeconomic History   Marital status: Married    Spouse name: Not on file   Number of children: Not on file   Years of education: Not on file   Highest education level: Not on file  Occupational History   Occupation: retired  Tobacco Use   Smoking status: Former    Packs/day: 0.50    Years: 35.00    Pack years: 17.50    Types: Cigarettes    Quit date: 05/08/2006    Years since quitting: 14.5   Smokeless tobacco: Never  Vaping Use   Vaping Use: Never used  Substance and Sexual Activity   Alcohol use: Yes    Comment: seldomly   Drug use: No   Sexual activity: Not on file    Comment: lives with husband, no dietary restrictions.   Other Topics Concern   Not on file  Social History Narrative   Not on file   Social Determinants of Health   Financial Resource Strain: Low Risk    Difficulty of Paying Living Expenses: Not hard at all  Food Insecurity: No Food Insecurity   Worried About Paediatric nurse in the Last Year: Never true   [place] in the Last Year: Never true  Transportation Needs: No Transportation Needs   Lack of Transportation (Medical): No   Lack of Transportation (Non-Medical): No  Physical Activity: Inactive   Days of Exercise per Week: 0 days   Minutes of Exercise per Session: 0 min  Stress: No Stress Concern Present   Feeling of Stress : Only a little  Social Connections: Moderately Integrated   Frequency of Communication with Friends and Family: More  than three times a week   Frequency of Social Gatherings with Friends and Family: Once a week   Attends Religious Services: More than 4 times per year   Active Member of [employer] or Organizations: No   Attends Music therapist: Never   Marital Status: Married  Human resources officer Violence: Not At Risk   Fear of Current or Ex-Partner: No   Emotionally Abused: No   Physically Abused: No   Sexually Abused: No    Outpatient Medications Prior to Visit  Medication Sig Dispense Refill   allopurinol (ZYLOPRIM) 300 MG tablet TAKE 1 TABLET BY MOUTH EVERY DAY 90 tablet 0   aspirin EC 81 MG tablet Take 81 mg by mouth daily.     atorvastatin (LIPITOR) 20 MG tablet TAKE 1 TABLET BY MOUTH EVERY DAY 90 tablet 1   Calcium Carb-Cholecalciferol (CALCIUM 600 + D PO) Take 1 tablet by mouth daily.     diphenhydramine-acetaminophen (TYLENOL PM) 25-500 MG TABS Take 1 tablet by mouth at bedtime as needed (sleep).      furosemide (LASIX) 40 MG tablet TAKE 1 TABLET BY MOUTH EVERY DAY 90 tablet 3   glimepiride (AMARYL) 2 MG tablet TAKE 1 TABLET BY MOUTH EVERY DAY WITH BREAKFAST 90 tablet 1   hyoscyamine (LEVSIN SL) 0.125 MG SL tablet Place 1 tablet (0.125 mg total) under the tongue every 6 (six) hours as needed. 30 tablet 1   irbesartan (AVAPRO) 75 MG tablet TAKE 1 TABLET BY MOUTH EVERY DAY 90 tablet 1   Lancets (ONETOUCH DELICA PLUS [id number]) MISC USE AS DIRECTED TO TEST DAILY 100 each 12   meclizine (ANTIVERT)  12.5 MG tablet Take 1 tablet (12.5 mg total) by mouth 3 (three) times daily as needed for dizziness. As needed for   Dizziness or nausea 30 tablet 1   metFORMIN (GLUCOPHAGE) 500 MG tablet TAKE 1 TABLET BY MOUTH 2 TIMES DAILY WITH A MEAL. 180 tablet 1   methocarbamol (ROBAXIN) 500 MG tablet Take 1 tablet (500 mg total) by mouth 4 (four) times daily. 45 tablet 1   nystatin cream (MYCOSTATIN) Apply 1 application topically 2 (two) times daily. 30 g 1   ondansetron (ZOFRAN-ODT) 4 MG disintegrating tablet Take by mouth.     ONETOUCH VERIO test strip CEHCK BLOOD SUGAR DAILY AS NEEDED 100 strip 12   Vitamin D, Ergocalciferol, (DRISDOL) 1.25 MG (50000 UNIT) CAPS capsule TAKE 1 CAPSULE (50,000 UNITS TOTAL) BY MOUTH EVERY 7 (SEVEN) DAYS 12 capsule 1   carvedilol (COREG) 6.25 MG tablet TAKE 1 TABLET (6.25 MG TOTAL) BY MOUTH 2 (TWO) TIMES DAILY. PT NEEDS AN OV FOR FUTURE REFILLS 180 tablet 1   fluconazole (DIFLUCAN) 150 MG tablet Take 1 tablet (150 mg total) by mouth once a week. 2 tablet 0   No facility-administered medications prior to visit.    No Known Allergies  ROS See HPI    Objective:    Physical Exam Constitutional:      General: She is not in acute distress.    Appearance: Normal appearance. She is not ill-appearing.  HENT:     Head: Normocephalic and atraumatic.     Right Ear: External ear normal.     Left Ear: External ear normal.  Eyes:     Extraocular Movements: Extraocular movements intact.     Pupils: Pupils are equal, round, and reactive to light.  Cardiovascular:     Rate and Rhythm: Normal rate and regular rhythm.     Heart sounds: Normal  heart sounds. No murmur heard.   No gallop.  Pulmonary:     Effort: Pulmonary effort is normal. No respiratory distress.     Breath sounds: Normal breath sounds. No wheezing or rales.  Abdominal:     General: Bowel sounds are normal.     Palpations: Abdomen is soft.     Tenderness: There is no abdominal tenderness.  Skin:    General:  Skin is warm and dry.  Neurological:     Mental Status: She is alert and oriented to person, place, and time.  Psychiatric:        Behavior: Behavior normal.    BP (!) 97/38 (BP Location: Right Arm, Patient Position: Sitting, Cuff Size: Large)   Pulse 74   Temp 98.4 F (36.9 C) (Oral)   Resp 16   Ht _0  (1.626 m)   Wt 217 lb (98.4 kg)   SpO2 97%   BMI 37.25 kg/m  Wt Readings from Last 3 Encounters:  12/08/20 217 lb (98.4 kg)  10/14/20 218 lb (98.9 kg)  09/23/20 216 lb 6.4 oz (98.2 kg)       Assessment & Plan:   Problem List Items Addressed This Visit       Unprioritized   Abdominal pain - Primary    Resolved.  I would like to repeat her lab work including lipase.  Suspect she had an acute viral gastroenteritis.        Relevant Orders   Comp Met (CMET)   Lipase   CBC with Differential/Platelet   CAD- She denies chest pain or sob today.  I would like her cardiologist to be aware of her elevated troponin while in the ED and will reach out to her cardiology team.  ? If the elevation was related to cardiac stress or renal insufficiency?   Her BP was a bit soft today.  I have advised her to decrease her carvedilol from 6.45m bid to 3.1275mbid and to follow back up in 1 week.   Meds ordered this encounter  Medications   carvedilol (COREG) 6.25 MG tablet    Sig: Take 0.5 tablets (3.125 mg total) by mouth 2 (two) times daily with a meal. Pt needs an OV for future refills    Dispense:  180 tablet    Refill:  1    Order Specific Question:   Supervising Provider    Answer:   BLMosie [id number]]    I, MeDebbrah AlarP, personally preformed the services described in this documentation.  All medical record entries made by the scribe were at my direction and in my presence.  I have reviewed the chart and discharge instructions (if applicable) and agree that the record reflects my personal performance and is accurate and complete. 12/08/2020   I,Shehryar Baig,acting  as a scribe for MeNance PearNP.,have documented all relevant documentation on the behalf of MeNance PearNP,as directed by  MeNance PearNP while in the presence of MeNance PearNP.   MeNance PearNP

## 2020-12-08 NOTE — Assessment & Plan Note (Addendum)
She denies chest pain or sob today.  I would like her cardiologist to be aware of her elevated troponin while in the ED and will reach out to her cardiology team.  ? If the elevation was related to cardiac stress or renal insufficiency?   Her BP was a bit soft today.  I have advised her to decrease her carvedilol from 6.25mg  bid to 3.125mg  bid and to follow back up in 1 week.   BP Readings from Last 3 Encounters:  12/08/20 (!) 97/38  10/14/20 (!) 126/50  09/23/20 (!) 103/54

## 2020-12-08 NOTE — Patient Instructions (Addendum)
Please complete lab work prior to leaving.   Please cut your carvedilol tablet in half and take 1/2 tab twice daily.

## 2020-12-08 NOTE — Progress Notes (Deleted)
Patient is a 75 year old female who presents today for ED follow-up.  She presented to the [facility] ED on 12/05/2020 with complaint of abdominal pain, nausea, vomiting, and diarrhea.  Work-up included the following:  Normal BNP Negative D-dimer Elevated Genfiber cardiac troponin Negative flu A, flu B, and COVID testing Negative GI stool panel Unremarkable urinalysis CBC noting mild macrocytic anemia with a hemoglobin of 11 Complete metabolic panel noting mild elevation of calcium and creatinine of 1.54.  In addition AST was mildly elevated at 42. Lipase was also elevated at 94. Head CT was performed which noted no acute changes Chest x-ray noted medial elevated right hemidiaphragm otherwise unremarkable CT abdomen pelvis-noted patchy right basilar opacities, diverticulosis without diverticulitis, unchanged cortical scarring in the right kidney midpole, otherwise unremarkable.

## 2020-12-15 ENCOUNTER — Encounter: Payer: Self-pay | Admitting: Family

## 2020-12-15 ENCOUNTER — Ambulatory Visit (INDEPENDENT_AMBULATORY_CARE_PROVIDER_SITE_OTHER): Payer: Medicare HMO | Admitting: Family

## 2020-12-15 ENCOUNTER — Other Ambulatory Visit: Payer: Self-pay

## 2020-12-15 VITALS — BP 109/47 | HR 80 | Temp 97.9°F | Resp 16 | Wt 218.0 lb

## 2020-12-15 DIAGNOSIS — D649 Anemia, unspecified: Secondary | ICD-10-CM | POA: Diagnosis not present

## 2020-12-15 DIAGNOSIS — E875 Hyperkalemia: Secondary | ICD-10-CM

## 2020-12-15 DIAGNOSIS — I10 Essential (primary) hypertension: Secondary | ICD-10-CM | POA: Insufficient documentation

## 2020-12-15 DIAGNOSIS — K859 Acute pancreatitis without necrosis or infection, unspecified: Secondary | ICD-10-CM | POA: Diagnosis not present

## 2020-12-15 HISTORY — DX: Essential (primary) hypertension: I10

## 2020-12-15 LAB — COMPREHENSIVE METABOLIC PANEL
ALT: 29 U/L (ref 0–35)
AST: 35 U/L (ref 0–37)
Albumin: 4.1 g/dL (ref 3.5–5.2)
Alkaline Phosphatase: 55 U/L (ref 39–117)
BUN: 25 mg/dL — ABNORMAL HIGH (ref 6–23)
CO2: 25 mEq/L (ref 19–32)
Calcium: 10.2 mg/dL (ref 8.4–10.5)
Chloride: 103 mEq/L (ref 96–112)
Creatinine, Ser: 1.28 mg/dL — ABNORMAL HIGH (ref 0.40–1.20)
GFR: 40.38 mL/min — ABNORMAL LOW (ref >60.00)
Glucose, Bld: 158 mg/dL — ABNORMAL HIGH (ref 70–99)
Potassium: 4.7 mEq/L (ref 3.5–5.1)
Sodium: 138 mEq/L (ref 135–145)
Total Bilirubin: 0.9 mg/dL (ref 0.2–1.2)
Total Protein: 6.7 g/dL (ref 6.0–8.3)

## 2020-12-15 LAB — CBC WITH DIFFERENTIAL/PLATELET
Basophils Absolute: 0 10*3/uL (ref 0.0–0.1)
Basophils Relative: 0.6 % (ref 0.0–3.0)
Eosinophils Absolute: 0.1 10*3/uL (ref 0.0–0.7)
Eosinophils Relative: 2.2 % (ref 0.0–5.0)
HCT: 33.3 % — ABNORMAL LOW (ref 36.0–46.0)
Hemoglobin: 10.8 g/dL — ABNORMAL LOW (ref 12.0–15.0)
Lymphocytes Relative: 23.3 % (ref 12.0–46.0)
Lymphs Abs: 1.6 10*3/uL (ref 0.7–4.0)
MCHC: 32.4 g/dL (ref 30.0–36.0)
MCV: 100.1 fl — ABNORMAL HIGH (ref 78.0–100.0)
Monocytes Absolute: 0.4 10*3/uL (ref 0.1–1.0)
Monocytes Relative: 5.5 % (ref 3.0–12.0)
Neutro Abs: 4.6 10*3/uL (ref 1.4–7.7)
Neutrophils Relative %: 68.4 % (ref 43.0–77.0)
Platelets: 177 10*3/uL (ref 150.0–400.0)
RBC: 3.32 Mil/uL — ABNORMAL LOW (ref 3.87–5.11)
RDW: 16.3 % — ABNORMAL HIGH (ref 11.5–15.5)
WBC: 6.7 10*3/uL (ref 4.0–10.5)

## 2020-12-15 LAB — IRON: Iron: 77 ug/dL (ref 42–145)

## 2020-12-15 LAB — VITAMIN B12: Vitamin B-12: 367 pg/mL (ref 211–911)

## 2020-12-15 LAB — LIPASE: Lipase: 107 U/L — ABNORMAL HIGH (ref 11.0–59.0)

## 2020-12-15 LAB — FOLATE: Folate: 13.5 ng/mL (ref >5.9)

## 2020-12-15 NOTE — Progress Notes (Deleted)
Subjective:     Patient ID: Amanda Crawford, female    DOB: 1945/08/08, 75 y.o.   MRN: [id number]  Chief Complaint  Patient presents with   Hypertension    Follow up on low bp last week    HPI Patient is in today for ***  Health Maintenance Due  Topic Date Due   FOOT EXAM  Never done   INFLUENZA VACCINE  08/30/2020   COVID-19 Vaccine (5 - Booster for Pfizer series) 09/21/2020    Past Medical History:  Diagnosis Date   Allergic state 03/12/2015   Arthritis    Colon polyp 11/19/2014   Dermatitis 03/12/2015   Diarrhea 06/29/2016   Diverticulitis    DM (diabetes mellitus), type 2 ([place]) 06/29/2016   GERD (gastroesophageal reflux disease)    occ   Gout 03/12/2015   H/O measles    H/O mumps    Headache(784.0)    migraines   History of chicken pox 11/23/2014   Hyperglycemia 06/29/2016   Lung cancer ([place])    Migraine 11/23/2014   Obesity 11/23/2014   Pneumonia    child   Preventative health care 09/12/2015   Renal cell cancer ([place])    renal cell ca dx 9/01 and 8/08;   Renal insufficiency    Shortness of breath    occ   Skin lesion of breast 03/12/2015    Past Surgical History:  Procedure Laterality Date   ABDOMINAL HYSTERECTOMY     APPENDECTOMY     CARDIAC CATHETERIZATION     yrs ago neg   CHOLECYSTECTOMY     COLONOSCOPY  2018   ESOPHAGOGASTRODUODENOSCOPY     years ago   IR RADIOLOGIST EVAL & MGMT  07/17/2018   IR RADIOLOGIST EVAL & MGMT  09/12/2018   IR RADIOLOGIST EVAL & MGMT  06/18/2019   IR RADIOLOGIST EVAL & MGMT  01/13/2020   IR RADIOLOGIST EVAL & MGMT  07/08/2020   IR RADIOLOGIST EVAL & MGMT  10/12/2020   KIDNEY SURGERY  2001   Removed of left kidney    LUNG CANCER SURGERY Left 08   RADIOLOGY WITH ANESTHESIA N/A 08/21/2018   Procedure: CT WITH ANESTHESIA RENAL CRYOABLATION;  Surgeon: Arne Cleveland, MD;  Location: [facility];  Service: Radiology;  Laterality: N/A;   RENAL MASS EXCISION Left 01   RIGHT/LEFT HEART CATH AND CORONARY ANGIOGRAPHY N/A 07/27/2016    Procedure: Right/Left Heart Cath and Coronary Angiography;  Surgeon: Leonie Man, MD;  Location: [place] [facility];  Service: Cardiovascular;  Laterality: N/A;   THORACOTOMY Left 05/09/2012   Procedure: THORACOTOMY MAJOR;  Surgeon: Gaye Pollack, MD;  Location: [facility];  Service: Thoracic;  Laterality: Left;   VIDEO BRONCHOSCOPY N/A 05/09/2012   Procedure: VIDEO BRONCHOSCOPY;  Surgeon: Gaye Pollack, MD;  [facility];  Service: Thoracic;  Laterality: N/A;   WEDGE RESECTION Left 05/09/2012   Procedure: LEFT UPPER LOBE WEDGE RESECTION;  Surgeon: Gaye Pollack, MD;  [facility];  Service: Thoracic;  Laterality: Left;    Family History  Problem Relation Age of Onset   Dementia Mother    Heart failure Father    Diabetes Father    Hyperlipidemia Father    Heart disease Father    Hypertension Father    Asthma Brother    Asthma Daughter    Alcohol abuse Maternal Aunt    Colon cancer Paternal Aunt    Cancer Paternal Grandmother    Hearing loss Paternal Grandfather  Stroke Paternal Grandfather     Social History   Socioeconomic History   Marital status: Married    Spouse name: Not on file   Number of children: Not on file   Years of education: Not on file   Highest education level: Not on file  Occupational History   Occupation: retired  Tobacco Use   Smoking status: Former    Packs/day: 0.50    Years: 35.00    Pack years: 17.50    Types: Cigarettes    Quit date: 05/08/2006    Years since quitting: 14.6   Smokeless tobacco: Never  Vaping Use   Vaping Use: Never used  Substance and Sexual Activity   Alcohol use: Yes    Comment: seldomly   Drug use: No   Sexual activity: Not on file    Comment: lives with husband, no dietary restrictions.   Other Topics Concern   Not on file  Social History Narrative   Not on file   Social Determinants of Health   Financial Resource Strain: Low Risk    Difficulty of Paying Living Expenses: Not hard at all  Food  Insecurity: No Food Insecurity   Worried About Charity fundraiser in the Last Year: Never true   [employer] in the Last Year: Never true  Transportation Needs: No Transportation Needs   Lack of Transportation (Medical): No   Lack of Transportation (Non-Medical): No  Physical Activity: Inactive   Days of Exercise per Week: 0 days   Minutes of Exercise per Session: 0 min  Stress: No Stress Concern Present   Feeling of Stress : Only a little  Social Connections: Moderately Integrated   Frequency of Communication with Friends and Family: More than three times a week   Frequency of Social Gatherings with Friends and Family: Once a week   Attends Religious Services: More than 4 times per year   Active Member of [employer] or Organizations: No   Attends Music therapist: Never   Marital Status: Married  Human resources officer Violence: Not At Risk   Fear of Current or Ex-Partner: No   Emotionally Abused: No   Physically Abused: No   Sexually Abused: No    Outpatient Medications Prior to Visit  Medication Sig Dispense Refill   allopurinol (ZYLOPRIM) 300 MG tablet TAKE 1 TABLET BY MOUTH EVERY DAY 90 tablet 0   aspirin EC 81 MG tablet Take 81 mg by mouth daily.     atorvastatin (LIPITOR) 20 MG tablet TAKE 1 TABLET BY MOUTH EVERY DAY 90 tablet 1   Calcium Carb-Cholecalciferol (CALCIUM 600 + D PO) Take 1 tablet by mouth daily.     carvedilol (COREG) 6.25 MG tablet Take 0.5 tablets (3.125 mg total) by mouth 2 (two) times daily with a meal. Pt needs an OV for future refills 180 tablet 1   diphenhydramine-acetaminophen (TYLENOL PM) 25-500 MG TABS Take 1 tablet by mouth at bedtime as needed (sleep).      furosemide (LASIX) 40 MG tablet TAKE 1 TABLET BY MOUTH EVERY DAY 90 tablet 3   glimepiride (AMARYL) 2 MG tablet TAKE 1 TABLET BY MOUTH EVERY DAY WITH BREAKFAST 90 tablet 1   hyoscyamine (LEVSIN SL) 0.125 MG SL tablet Place 1 tablet (0.125 mg total) under the tongue every 6 (six) hours as  needed. 30 tablet 1   irbesartan (AVAPRO) 75 MG tablet TAKE 1 TABLET BY MOUTH EVERY DAY 90 tablet 1   Lancets (ONETOUCH DELICA PLUS [id number])  MISC USE AS DIRECTED TO TEST DAILY 100 each 12   meclizine (ANTIVERT) 12.5 MG tablet Take 1 tablet (12.5 mg total) by mouth 3 (three) times daily as needed for dizziness. As needed for   Dizziness or nausea 30 tablet 1   metFORMIN (GLUCOPHAGE) 500 MG tablet TAKE 1 TABLET BY MOUTH 2 TIMES DAILY WITH A MEAL. 180 tablet 1   methocarbamol (ROBAXIN) 500 MG tablet Take 1 tablet (500 mg total) by mouth 4 (four) times daily. 45 tablet 1   nystatin cream (MYCOSTATIN) Apply 1 application topically 2 (two) times daily. 30 g 1   ondansetron (ZOFRAN-ODT) 4 MG disintegrating tablet Take by mouth.     ONETOUCH VERIO test strip CEHCK BLOOD SUGAR DAILY AS NEEDED 100 strip 12   Vitamin D, Ergocalciferol, (DRISDOL) 1.25 MG (50000 UNIT) CAPS capsule TAKE 1 CAPSULE (50,000 UNITS TOTAL) BY MOUTH EVERY 7 (SEVEN) DAYS 12 capsule 1   No facility-administered medications prior to visit.    No Known Allergies  ROS     Objective:    Physical Exam  BP (!) 109/47 (BP Location: Right Arm, Patient Position: Sitting, Cuff Size: Large)   Pulse 80   Temp 97.9 F (36.6 C) (Oral)   Resp 16   Wt 218 lb (98.9 kg)   SpO2 94%   BMI 37.42 kg/m  Wt Readings from Last 3 Encounters:  12/15/20 218 lb (98.9 kg)  12/08/20 217 lb (98.4 kg)  10/14/20 218 lb (98.9 kg)       Assessment & Plan:   Problem List Items Addressed This Visit       Unprioritized   Anemia   Relevant Orders   CBC with Differential/Platelet   B12   Folate   Iron   Fecal occult blood, imunochemical([facility])   Acute pancreatitis - Primary    Based on last lipase results. She is currently asymptomatic. Will repeat lipase today. Hopefully it is trending downward.       Relevant Orders   Comp Met (CMET)   Lipase    I am having Ileana Ladd. Hadley maintain her diphenhydramine-acetaminophen,  meclizine, aspirin EC, Calcium Carb-Cholecalciferol (CALCIUM 600 + D PO), OneTouch Verio, furosemide, OneTouch Delica Plus [id number], irbesartan, methocarbamol, hyoscyamine, nystatin cream, glimepiride, atorvastatin, allopurinol, metFORMIN, Vitamin D (Ergocalciferol), ondansetron, and carvedilol.  No orders of the defined types were placed in this encounter.

## 2020-12-15 NOTE — Progress Notes (Signed)
Subjective:   By signing my name below, I, Shehryar Baig, attest that this documentation has been prepared under the direction and in the presence of Debbrah Alar NP. 12/15/2020    Patient ID: Amanda Crawford, female    DOB: August 28, 1945, 75 y.o.   MRN: [id number]  Chief Complaint  Patient presents with   Hypertension    Follow up on low bp last week    Hypertension  Patient is in today for a office visit.   Blood pressure- During last visit her blood pressure was decreased and her carvedilol was dosage was reduced. Her blood pressure is still low during this visit. She measured her blood pressure this morning was 122/77. She continues taking 6.25 mg carvedilol daily PO, 75 mg irbesartan daily PO, 40 mg lasix daily PO and reports having low energy throughout the day. She has an upcomming appointment with her cardiologist on 12/29/2020. Her potasium levels and iron levels were slightly low during her last blood work and is interested in checking it during lab work this visit.   BP Readings from Last 3 Encounters:  12/15/20 (!) 109/47  12/08/20 (!) 97/38  10/14/20 (!) 126/50   Pulse Readings from Last 3 Encounters:  12/15/20 80  12/08/20 74  10/14/20 67   Alcohol- She rarely drinks alcohol at social settings. She only drinks 1 glass of wine and does not drink hard liquor or beer.  Nausea/Vomiting- She has no other episodes of nausea or vomiting. She denies having any abdominal pain at this time.  Colonoscopy- Her last colonoscopy was 10/03/2016.    Health Maintenance Due  Topic Date Due   FOOT EXAM  Never done   INFLUENZA VACCINE  08/30/2020   COVID-19 Vaccine (5 - Booster for Pfizer series) 09/21/2020    Past Medical History:  Diagnosis Date   Allergic state 03/12/2015   Arthritis    Colon polyp 11/19/2014   Dermatitis 03/12/2015   Diarrhea 06/29/2016   Diverticulitis    DM (diabetes mellitus), type 2 ([place]) 06/29/2016   GERD (gastroesophageal reflux disease)    occ    Gout 03/12/2015   H/O measles    H/O mumps    Headache(784.0)    migraines   History of chicken pox 11/23/2014   Hyperglycemia 06/29/2016   Lung cancer ([place])    Migraine 11/23/2014   Obesity 11/23/2014   Pneumonia    child   Preventative health care 09/12/2015   Renal cell cancer ([place])    renal cell ca dx 9/01 and 8/08;   Renal insufficiency    Shortness of breath    occ   Skin lesion of breast 03/12/2015    Past Surgical History:  Procedure Laterality Date   ABDOMINAL HYSTERECTOMY     APPENDECTOMY     CARDIAC CATHETERIZATION     yrs ago neg   CHOLECYSTECTOMY     COLONOSCOPY  2018   ESOPHAGOGASTRODUODENOSCOPY     years ago   IR RADIOLOGIST EVAL & MGMT  07/17/2018   IR RADIOLOGIST EVAL & MGMT  09/12/2018   IR RADIOLOGIST EVAL & MGMT  06/18/2019   IR RADIOLOGIST EVAL & MGMT  01/13/2020   IR RADIOLOGIST EVAL & MGMT  07/08/2020   IR RADIOLOGIST EVAL & MGMT  10/12/2020   KIDNEY SURGERY  2001   Removed of left kidney    LUNG CANCER SURGERY Left 08   RADIOLOGY WITH ANESTHESIA N/A 08/21/2018   Procedure: CT WITH ANESTHESIA RENAL CRYOABLATION;  Surgeon: Arne Cleveland,  MD;  Location: [facility];  Service: Radiology;  Laterality: N/A;   RENAL MASS EXCISION Left 01   RIGHT/LEFT HEART CATH AND CORONARY ANGIOGRAPHY N/A 07/27/2016   Procedure: Right/Left Heart Cath and Coronary Angiography;  Surgeon: Leonie Man, MD;  Location: [facility];  Service: Cardiovascular;  Laterality: N/A;   THORACOTOMY Left 05/09/2012   Procedure: THORACOTOMY MAJOR;  Surgeon: Gaye Pollack, MD;  Location: [facility];  Service: Thoracic;  Laterality: Left;   VIDEO BRONCHOSCOPY N/A 05/09/2012   Procedure: VIDEO BRONCHOSCOPY;  Surgeon: Gaye Pollack, MD;  Location: [facility];  Service: Thoracic;  Laterality: N/A;   WEDGE RESECTION Left 05/09/2012   Procedure: LEFT UPPER LOBE WEDGE RESECTION;  Surgeon: Gaye Pollack, MD;  Location: [facility];  Service: Thoracic;  Laterality: Left;    Family History  Problem  Relation Age of Onset   Dementia Mother    Heart failure Father    Diabetes Father    Hyperlipidemia Father    Heart disease Father    Hypertension Father    Asthma Brother    Asthma Daughter    Alcohol abuse Maternal Aunt    Colon cancer Paternal Aunt    Cancer Paternal Grandmother    Hearing loss Paternal Grandfather    Stroke Paternal Grandfather     Social History   Socioeconomic History   Marital status: Married    Spouse name: Not on file   Number of children: Not on file   Years of education: Not on file   Highest education level: Not on file  Occupational History   Occupation: retired  Tobacco Use   Smoking status: Former    Packs/day: 0.50    Years: 35.00    Pack years: 17.50    Types: Cigarettes    Quit date: 05/08/2006    Years since quitting: 14.6   Smokeless tobacco: Never  Vaping Use   Vaping Use: Never used  Substance and Sexual Activity   Alcohol use: Yes    Comment: seldomly   Drug use: No   Sexual activity: Not on file    Comment: lives with husband, no dietary restrictions.   Other Topics Concern   Not on file  Social History Narrative   Not on file   Social Determinants of Health   Financial Resource Strain: Low Risk    Difficulty of Paying Living Expenses: Not hard at all  Food Insecurity: No Food Insecurity   Worried About Charity fundraiser in the Last Year: Never true   [place] in the Last Year: Never true  Transportation Needs: No Transportation Needs   Lack of Transportation (Medical): No   Lack of Transportation (Non-Medical): No  Physical Activity: Inactive   Days of Exercise per Week: 0 days   Minutes of Exercise per Session: 0 min  Stress: No Stress Concern Present   Feeling of Stress : Only a little  Social Connections: Moderately Integrated   Frequency of Communication with Friends and Family: More than three times a week   Frequency of Social Gatherings with Friends and Family: Once a week   Attends Religious  Services: More than 4 times per year   Active Member of [employer] or Organizations: No   Attends Archivist Meetings: Never   Marital Status: Married  Human resources officer Violence: Not At Risk   Fear of Current or Ex-Partner: No   Emotionally Abused: No   Physically Abused: No   Sexually Abused:  No    Outpatient Medications Prior to Visit  Medication Sig Dispense Refill   allopurinol (ZYLOPRIM) 300 MG tablet TAKE 1 TABLET BY MOUTH EVERY DAY 90 tablet 0   aspirin EC 81 MG tablet Take 81 mg by mouth daily.     atorvastatin (LIPITOR) 20 MG tablet TAKE 1 TABLET BY MOUTH EVERY DAY 90 tablet 1   Calcium Carb-Cholecalciferol (CALCIUM 600 + D PO) Take 1 tablet by mouth daily.     carvedilol (COREG) 6.25 MG tablet Take 0.5 tablets (3.125 mg total) by mouth 2 (two) times daily with a meal. Pt needs an OV for future refills 180 tablet 1   diphenhydramine-acetaminophen (TYLENOL PM) 25-500 MG TABS Take 1 tablet by mouth at bedtime as needed (sleep).      furosemide (LASIX) 40 MG tablet TAKE 1 TABLET BY MOUTH EVERY DAY 90 tablet 3   glimepiride (AMARYL) 2 MG tablet TAKE 1 TABLET BY MOUTH EVERY DAY WITH BREAKFAST 90 tablet 1   hyoscyamine (LEVSIN SL) 0.125 MG SL tablet Place 1 tablet (0.125 mg total) under the tongue every 6 (six) hours as needed. 30 tablet 1   irbesartan (AVAPRO) 75 MG tablet TAKE 1 TABLET BY MOUTH EVERY DAY 90 tablet 1   Lancets (ONETOUCH DELICA PLUS [id number]) MISC USE AS DIRECTED TO TEST DAILY 100 each 12   meclizine (ANTIVERT) 12.5 MG tablet Take 1 tablet (12.5 mg total) by mouth 3 (three) times daily as needed for dizziness. As needed for   Dizziness or nausea 30 tablet 1   metFORMIN (GLUCOPHAGE) 500 MG tablet TAKE 1 TABLET BY MOUTH 2 TIMES DAILY WITH A MEAL. 180 tablet 1   methocarbamol (ROBAXIN) 500 MG tablet Take 1 tablet (500 mg total) by mouth 4 (four) times daily. 45 tablet 1   nystatin cream (MYCOSTATIN) Apply 1 application topically 2 (two) times daily. 30 g 1    ondansetron (ZOFRAN-ODT) 4 MG disintegrating tablet Take by mouth.     ONETOUCH VERIO test strip CEHCK BLOOD SUGAR DAILY AS NEEDED 100 strip 12   Vitamin D, Ergocalciferol, (DRISDOL) 1.25 MG (50000 UNIT) CAPS capsule TAKE 1 CAPSULE (50,000 UNITS TOTAL) BY MOUTH EVERY 7 (SEVEN) DAYS 12 capsule 1   No facility-administered medications prior to visit.    No Known Allergies  ROS See HPI    Objective:    Physical Exam Constitutional:      General: She is not in acute distress.    Appearance: Normal appearance. She is not ill-appearing.  HENT:     Head: Normocephalic and atraumatic.     Right Ear: External ear normal.     Left Ear: External ear normal.  Eyes:     Extraocular Movements: Extraocular movements intact.     Pupils: Pupils are equal, round, and reactive to light.  Cardiovascular:     Rate and Rhythm: Normal rate and regular rhythm.     Heart sounds: Normal heart sounds. No murmur heard.   No gallop.  Pulmonary:     Effort: Pulmonary effort is normal. No respiratory distress.     Breath sounds: Normal breath sounds. No wheezing or rales.  Abdominal:     Palpations: Abdomen is soft.     Tenderness: There is no abdominal tenderness.  Skin:    General: Skin is warm and dry.  Neurological:     Mental Status: She is alert and oriented to person, place, and time.  Psychiatric:        Behavior: Behavior normal.  BP (!) 109/47 (BP Location: Right Arm, Patient Position: Sitting, Cuff Size: Large)   Pulse 80   Temp 97.9 F (36.6 C) (Oral)   Resp 16   Wt 218 lb (98.9 kg)   SpO2 94%   BMI 37.42 kg/m  Wt Readings from Last 3 Encounters:  12/15/20 218 lb (98.9 kg)  12/08/20 217 lb (98.4 kg)  10/14/20 218 lb (98.9 kg)       Assessment & Plan:   Problem List Items Addressed This Visit       Unprioritized   Hyperkalemia    Will repeat K+.        Anemia   Relevant Orders   CBC with Differential/Platelet   B12   Folate   Iron   Fecal occult blood,  imunochemical([facility])   Acute pancreatitis - Primary    Based on last lipase results. She is currently asymptomatic. Will repeat lipase today. Hopefully it is trending downward.       Relevant Orders   Comp Met (CMET)   Lipase   BP mildly low but improved.  I reached out to Dr. Debara Pickett and he will recheck at her upcoming appointment in a few weeks.   No orders of the defined types were placed in this encounter.   I, Debbrah Alar NP, personally preformed the services described in this documentation.  All medical record entries made by the scribe were at my direction and in my presence.  I have reviewed the chart and discharge instructions (if applicable) and agree that the record reflects my personal performance and is accurate and complete. 12/15/2020   I,Shehryar Baig,acting as a Education administrator for Nance Pear, NP.,have documented all relevant documentation on the behalf of Nance Pear, NP,as directed by  Nance Pear, NP while in the presence of Nance Pear, NP.   Nance Pear, NP

## 2020-12-15 NOTE — Assessment & Plan Note (Signed)
Will repeat K+.

## 2020-12-15 NOTE — Patient Instructions (Signed)
Please complete lab work prior to leaving.   

## 2020-12-15 NOTE — Assessment & Plan Note (Signed)
Based on last lipase results. She is currently asymptomatic. Will repeat lipase today. Hopefully it is trending downward.

## 2020-12-17 ENCOUNTER — Telehealth: Payer: Self-pay | Admitting: Family

## 2020-12-17 DIAGNOSIS — K859 Acute pancreatitis without necrosis or infection, unspecified: Secondary | ICD-10-CM

## 2020-12-17 NOTE — Telephone Encounter (Signed)
Please advise pt that her lipase (pancreatic enzyme) is still elevated. I would like to obtain CT abd for further evaluation. Order placed.

## 2020-12-27 DIAGNOSIS — J101 Influenza due to other identified influenza virus with other respiratory manifestations: Secondary | ICD-10-CM | POA: Diagnosis not present

## 2020-12-27 NOTE — Telephone Encounter (Signed)
Called a few times but no answer, will let patient know CT was ordered but not authorized by insurance yet.   Per G. Rosana Hoes (Please advise CT was denied and peer to peer is required for approval tel# [phone] case # [id number]. Please provide auth # and valid dates once peer to peer is done)

## 2020-12-27 NOTE — Progress Notes (Deleted)
Cardiology Clinic Note   Patient Name: Amanda Crawford Date of Encounter: 12/27/2020  Primary Care Provider:  [name], MD Primary Cardiologist:  Pixie Casino, MD  Patient Profile    Amanda Crawford 75 year old female presents the clinic today for follow-up evaluation of her chronic systolic CHF and essential hypertension.  Past Medical History    Past Medical History:  Diagnosis Date   Allergic state 03/12/2015   Arthritis    Colon polyp 11/19/2014   Dermatitis 03/12/2015   Diarrhea 06/29/2016   Diverticulitis    DM (diabetes mellitus), type 2 ([place]) 06/29/2016   GERD (gastroesophageal reflux disease)    occ   Gout 03/12/2015   H/O measles    H/O mumps    Headache(784.0)    migraines   History of chicken pox 11/23/2014   Hyperglycemia 06/29/2016   Lung cancer ([place])    Migraine 11/23/2014   Obesity 11/23/2014   Pneumonia    child   Preventative health care 09/12/2015   Primary hypertension 12/15/2020   Renal cell cancer ([place])    renal cell ca dx 9/01 and 8/08;   Renal insufficiency    Shortness of breath    occ   Skin lesion of breast 03/12/2015   Past Surgical History:  Procedure Laterality Date   ABDOMINAL HYSTERECTOMY     APPENDECTOMY     CARDIAC CATHETERIZATION     yrs ago neg   CHOLECYSTECTOMY     COLONOSCOPY  2018   ESOPHAGOGASTRODUODENOSCOPY     years ago   IR RADIOLOGIST EVAL & MGMT  07/17/2018   IR RADIOLOGIST EVAL & MGMT  09/12/2018   IR RADIOLOGIST EVAL & MGMT  06/18/2019   IR RADIOLOGIST EVAL & MGMT  01/13/2020   IR RADIOLOGIST EVAL & MGMT  07/08/2020   IR RADIOLOGIST EVAL & MGMT  10/12/2020   KIDNEY SURGERY  2001   Removed of left kidney    LUNG CANCER SURGERY Left 08   RADIOLOGY WITH ANESTHESIA N/A 08/21/2018   Procedure: CT WITH ANESTHESIA RENAL CRYOABLATION;  Surgeon: Arne Cleveland, MD;  Location: [facility];  Service: Radiology;  Laterality: N/A;   RENAL MASS EXCISION Left 01   RIGHT/LEFT HEART CATH AND CORONARY ANGIOGRAPHY N/A  07/27/2016   Procedure: Right/Left Heart Cath and Coronary Angiography;  Surgeon: Leonie Man, MD;  Location: [facility];  Service: Cardiovascular;  Laterality: N/A;   THORACOTOMY Left 05/09/2012   Procedure: THORACOTOMY MAJOR;  Surgeon: Gaye Pollack, MD;  Location: [facility];  Service: Thoracic;  Laterality: Left;   VIDEO BRONCHOSCOPY N/A 05/09/2012   Procedure: VIDEO BRONCHOSCOPY;  Surgeon: Gaye Pollack, MD;  Location: [facility];  Service: Thoracic;  Laterality: N/A;   WEDGE RESECTION Left 05/09/2012   Procedure: LEFT UPPER LOBE WEDGE RESECTION;  Surgeon: Gaye Pollack, MD;  Location: [facility];  Service: Thoracic;  Laterality: Left;    Allergies  No Known Allergies  History of Present Illness    Amanda Crawford has a PMH of chronic systolic and diastolic CHF, LBBB, and nonischemic cardiomyopathy, essential hypertension, type 2 diabetes, GERD, migraine headaches, HLD, lung cancer, and renal cell cancer.  Echocardiogram 2019 showed an EF of 45-50%.  She was noted to have a abnormal EKG with LBBB dating back to 2008.  She presented with chest pain which radiated to her back and was found to have an abnormal EKG which led to cardiac catheterization 1/09.  She was noted to have normal  coronary arteries.  She was later found to have renal cell carcinoma and underwent nephrectomy.  She was ultimately found to have recurrent metastatic renal disease and required to partial lobectomies 2014.  She was initially seen by Dr. Debara Pickett at the request of her PCP due to shortness of breath.  She reported that she was fatigued easier and had difficulty walking up stairs and laying flat.  She reported a mild nonproductive cough and slight wheezing.  Her PCP had ordered an echocardiogram 6/18 which showed newly decreased EF from 50% down to 35-40%.  She was noted to have LVH and mild diastolic dysfunction as well as mild mitral regurgitation.  She was seen in follow-up 11/08/2016.  During that time she reported  some improvement with her heart failure symptoms.  Her diuresis has been increased to 40 mg daily.  Carvedilol 6.25 mg twice daily was added to her medication regimen.  She was on low-dose due to her solitary kidney.  She was again seen in follow-up 07/30/2017.  During that time she was tolerating low-dose losartan well.  Her echocardiogram showed slightly improved LVEF.  4/19 echocardiogram showed an EF of 45-50%.  CRT-P therapy had been considered however since her LVEF had improved it was felt that was not necessary.  Her blood pressure was 106/58 and she reported that at home it would run around 400-867 systolic.  She continued to do well without any increased shortness of breath or chest discomfort.  She was seen by Dr. Debara Pickett 03/04/2020.  During that time she continued to do well.  She was NYHA class I-2.  Her blood pressure continued to be well controlled.  Her blood pressure would not allow increase in her losartan and her repeat echocardiogram 03/29/2020 showed an LVEF of 55-60%, moderate concentric LVH, and trivial mitral valve regurgitation.  She presented to the emergency department 12/05/2020 at [facility].  She complained of vomiting and diarrhea that had started the Wednesday prior.  She noted abdominal cramping.  She shared concern for dehydration due to decreased oral intake.  Her blood pressure at that time was 132/58.  She reported that she felt somewhat lightheaded.  At the time of her exam she reported that her loose stools had resolved.  She denied any abdominal pain.  She reported continued nausea and vomiting.  She reported tolerating some p.o. but reported that she did not feel well.  She denied chest pain and increased shortness of breath her BUN was 31 with creatinine of 1.54.  Her potassium was 5.0.  Her UA was negative.  Her flu swab was negative for AB and RSV.  COVID was also not detected.  A CT of her abdomen pelvis showed diverticulosis without diverticulitis cardiomegaly, and patchy  opacities in her right lung base which were felt to be representative of atelectasis or infiltrates.  She was prescribed Zofran 4 mg p.o. every 8 hours as needed for nausea and discharged in stable condition 12/06/2020 12:47 AM.  Her EKG showed normal sinus rhythm with left bundle branch block.  She presents the clinic today for follow-up evaluation states***  *** denies chest pain, shortness of breath, lower extremity edema, fatigue, palpitations, melena, hematuria, hemoptysis, diaphoresis, weakness, presyncope, syncope, orthopnea, and PND.   Home Medications    Prior to Admission medications   Medication Sig Start Date End Date Taking? Authorizing Provider  allopurinol (ZYLOPRIM) 300 MG tablet TAKE 1 TABLET BY MOUTH EVERY DAY 11/18/20   [name], MD  aspirin EC 81 MG  tablet Take 81 mg by mouth daily.    [provider]  atorvastatin (LIPITOR) 20 MG tablet TAKE 1 TABLET BY MOUTH EVERY DAY 11/12/20   [name], MD  Calcium Carb-Cholecalciferol (CALCIUM 600 + D PO) Take 1 tablet by mouth daily. Patient not taking: No sig reported    [provider]  carvedilol (COREG) 6.25 MG tablet TAKE 1 TABLET (6.25 MG TOTAL) BY MOUTH 2 (TWO) TIMES DAILY. PT NEEDS AN OV FOR FUTURE REFILLS 11/22/20   Hilty, Nadean Corwin, MD  diphenhydramine-acetaminophen (TYLENOL PM) 25-500 MG TABS Take 1 tablet by mouth at bedtime as needed (sleep).     [provider]  fluconazole (DIFLUCAN) 150 MG tablet Take 1 tablet (150 mg total) by mouth once a week. 10/14/20   [name], MD  furosemide (LASIX) 40 MG tablet TAKE 1 TABLET BY MOUTH EVERY DAY 05/03/20   Hilty, Nadean Corwin, MD  glimepiride (AMARYL) 2 MG tablet TAKE 1 TABLET BY MOUTH EVERY DAY WITH BREAKFAST 10/28/20   [name], MD  hyoscyamine (LEVSIN SL) 0.125 MG SL tablet Place 1 tablet (0.125 mg total) under the tongue every 6 (six) hours as needed. 10/14/20   [name], MD  irbesartan (AVAPRO) 75 MG tablet TAKE 1 TABLET BY  MOUTH EVERY DAY 08/20/20   Hilty, Nadean Corwin, MD  Lancets [facility] DELICA PLUS [id number]) MISC USE AS DIRECTED TO TEST DAILY 07/21/20   [name], MD  meclizine (ANTIVERT) 12.5 MG tablet Take 1 tablet (12.5 mg total) by mouth 3 (three) times daily as needed for dizziness. As needed for   Dizziness or nausea 11/23/14   [name], MD  metFORMIN (GLUCOPHAGE) 500 MG tablet TAKE 1 TABLET BY MOUTH 2 TIMES DAILY WITH A MEAL. 11/22/20   [name], MD  methocarbamol (ROBAXIN) 500 MG tablet Take 1 tablet (500 mg total) by mouth 4 (four) times daily. 09/09/20   Ann Held, DO  nystatin cream (MYCOSTATIN) Apply 1 application topically 2 (two) times daily. 10/18/20   [name], MD  ONETOUCH VERIO test strip [facility] BLOOD SUGAR DAILY AS NEEDED 02/17/20   [name], MD  Vitamin D, Ergocalciferol, (DRISDOL) 1.25 MG (50000 UNIT) CAPS capsule TAKE 1 CAPSULE (50,000 UNITS TOTAL) BY MOUTH EVERY 7 (SEVEN) DAYS 11/22/20   [name], MD  glyBURIDE-metformin [facility]) 1.25-250 MG tablet Take 1 tablet by mouth 2 (two) times daily with a meal. 03/25/19 03/25/19  [name], MD    Family History    Family History  Problem Relation Age of Onset   Dementia Mother    Heart failure Father    Diabetes Father    Hyperlipidemia Father    Heart disease Father    Hypertension Father    Asthma Brother    Asthma Daughter    Alcohol abuse Maternal Aunt    Colon cancer Paternal Aunt    Cancer Paternal Grandmother    Hearing loss Paternal Grandfather    Stroke Paternal Grandfather    She indicated that her mother is deceased. She indicated that her father is deceased. She indicated that her sister is alive. She indicated that her brother is alive. She indicated that her maternal grandmother is deceased. She indicated that her maternal grandfather is deceased. She indicated that her paternal grandmother is deceased. She indicated that her paternal grandfather is deceased. She  indicated that her daughter is alive. She indicated that her son is alive. She  indicated that the status of her maternal aunt is unknown. She indicated that the status of her paternal aunt is unknown.  Social History    Social History   Socioeconomic History   Marital status: Married    Spouse name: Not on file   Number of children: Not on file   Years of education: Not on file   Highest education level: Not on file  Occupational History   Occupation: retired  Tobacco Use   Smoking status: Former    Packs/day: 0.50    Years: 35.00    Pack years: 17.50    Types: Cigarettes    Quit date: 05/08/2006    Years since quitting: 14.6   Smokeless tobacco: Never  Vaping Use   Vaping Use: Never used  Substance and Sexual Activity   Alcohol use: Yes    Comment: seldomly   Drug use: No   Sexual activity: Not on file    Comment: lives with husband, no dietary restrictions.   Other Topics Concern   Not on file  Social History Narrative   Not on file   Social Determinants of Health   Financial Resource Strain: Low Risk    Difficulty of Paying Living Expenses: Not hard at all  Food Insecurity: No Food Insecurity   Worried About Charity fundraiser in the Last Year: Never true   [place] in the Last Year: Never true  Transportation Needs: No Transportation Needs   Lack of Transportation (Medical): No   Lack of Transportation (Non-Medical): No  Physical Activity: Inactive   Days of Exercise per Week: 0 days   Minutes of Exercise per Session: 0 min  Stress: No Stress Concern Present   Feeling of Stress : Only a little  Social Connections: Moderately Integrated   Frequency of Communication with Friends and Family: More than three times a week   Frequency of Social Gatherings with Friends and Family: Once a week   Attends Religious Services: More than 4 times per year   Active Member of [employer] or Organizations: No   Attends Music therapist: Never   Marital Status:  Married  Human resources officer Violence: Not At Risk   Fear of Current or Ex-Partner: No   Emotionally Abused: No   Physically Abused: No   Sexually Abused: No     Review of Systems    General:  No chills, fever, night sweats or weight changes.  Cardiovascular:  No chest pain, dyspnea on exertion, edema, orthopnea, palpitations, paroxysmal nocturnal dyspnea. Dermatological: No rash, lesions/masses Respiratory: No cough, dyspnea Urologic: No hematuria, dysuria Abdominal:   No nausea, vomiting, diarrhea, bright red blood per rectum, melena, or hematemesis Neurologic:  No visual changes, wkns, changes in mental status. All other systems reviewed and are otherwise negative except as noted above.  Physical Exam    VS:  There were no vitals taken for this visit. , BMI There is no height or weight on file to calculate BMI. GEN: Well nourished, well developed, in no acute distress. HEENT: normal. Neck: Supple, no JVD, carotid bruits, or masses. Cardiac: RRR, no murmurs, rubs, or gallops. No clubbing, cyanosis, edema.  Radials/DP/PT 2+ and equal bilaterally.  Respiratory:  Respirations regular and unlabored, clear to auscultation bilaterally. GI: Soft, nontender, nondistended, BS + x 4. MS: no deformity or atrophy. Skin: warm and dry, no rash. Neuro:  Strength and sensation are intact. Psych: Normal affect.  Accessory Clinical Findings    Recent Labs: 10/14/2020: TSH  2.31 12/15/2020: ALT 29; BUN 25; Creatinine, Ser 1.28; Hemoglobin 10.8; Platelets 177.0; Potassium 4.7; Sodium 138   Recent Lipid Panel    Component Value Date/Time   CHOL 177 10/14/2020 1628   CHOL 193 09/30/2019 1014   TRIG 303.0 (H) 10/14/2020 1628   TRIG 267 (H) 09/30/2019 1014   HDL 51.70 10/14/2020 1628   CHOLHDL 3 10/14/2020 1628   VLDL 60.6 (H) 10/14/2020 1628   LDLCALC (H) 01/31/2007 0800    117        Total Cholesterol/HDL:CHD Risk Coronary Heart Disease Risk Table                     Men   Women  1/2  Average Risk   3.4   3.3  Average Risk       5.0   4.4  2 X Average Risk   9.6   7.1  3 X Average Risk  23.4   11.0        Use the calculated Patient Ratio above and the CHD Risk Table to determine the patient's CHD Risk.        ATP III CLASSIFICATION (LDL):  <100     mg/dL   Optimal  100-129  mg/dL   Near or Above                    Optimal  130-159  mg/dL   Borderline  160-189  mg/dL   High  >190     mg/dL   Very High   LDLDIRECT 74.0 10/14/2020 1628    ECG personally reviewed by me today- *** - No acute changes  Echocardiogram 03/29/2020 IMPRESSIONS     1. Left ventricular ejection fraction, by estimation, is 55 to 60%. The  left ventricle has normal function. The left ventricle has no regional  wall motion abnormalities. There is moderate concentric left ventricular  hypertrophy. Indeterminate diastolic  filling due to E-A fusion. The average left ventricular global  longitudinal strain is -19.5 %. The global longitudinal strain is normal.   2. Right ventricular systolic function is normal. The right ventricular  size is normal. Tricuspid regurgitation signal is inadequate for assessing  PA pressure.   3. The mitral valve is normal in structure. Trivial mitral valve  regurgitation. No evidence of mitral stenosis.   4. The aortic valve is tricuspid. Aortic valve regurgitation is not  visualized. Mild aortic valve sclerosis is present, with no evidence of  aortic valve stenosis.   5. The inferior vena cava is dilated in size with >50% respiratory  variability, suggesting right atrial pressure of 8 mmHg.   Comparison(s): 05/21/17 45-50%.  Assessment & Plan   1.  Nausea/vomiting-resolved.  Had several days of nausea vomiting and diarrhea that started on 12/01/20.  She presented to the emergency department at [facility] 12/[facility]/2020 and was discharged on 12/06/2020 after receiving a prescriptions for Zofran.  Recent lab work stable. Continue bland diet and progress as  tolerated Continue p.o. hydration Follow-up with PCP  Chronic systolic CHF-no increased DOE or activity intolerance.  Reports that she continues to be somewhat fatigued after her recent GI upset/viral illness.  Echocardiogram 03/29/2020 showed LVEF 55-60% Continue furosemide, irbesartan, carvedilol Heart healthy low-sodium diet Increase physical activity as tolerated  Nonischemic cardiomyopathy-was noted to have LBB B in 2008 and underwent cardiac catheterization 1/09 which showed normal coronary arteries.  Recovered EF. Continue carvedilol, irbesartan Heart healthy low-sodium diet Increase physical activity as tolerated  Essential hypertension-BP today***.  Continues to be well controlled at home. Continue carvedilol, irbesartan, Heart healthy low-sodium diet Increase physical activity as tolerated   Disposition: Follow-up with Dr. Debara Pickett in 3-4 months.  Jossie Ng. Lorriann Hansmann NP-C    12/27/2020, 7:57 AM [address] Office [phone] Fax [phone]  Notice: This dictation was prepared with Dragon dictation along with smaller phrase technology. Any transcriptional errors that result from this process are unintentional and may not be corrected upon review.  I spent***minutes examining this patient, reviewing medications, and using patient centered shared decision making involving her cardiac care.  Prior to her visit I spent greater than 20 minutes reviewing her past medical history,  medications, and prior cardiac tests.

## 2020-12-28 NOTE — Telephone Encounter (Signed)
Patient advised of results and reason for CT, she verbalized understanding and reports she was diagnosed with the flu yesterday

## 2020-12-29 ENCOUNTER — Ambulatory Visit: Payer: Medicare HMO | Admitting: General Practice

## 2020-12-31 ENCOUNTER — Telehealth: Payer: Self-pay | Admitting: Family

## 2020-12-31 NOTE — Telephone Encounter (Signed)
See letter.

## 2021-01-24 ENCOUNTER — Telehealth: Payer: Self-pay | Admitting: Family

## 2021-01-24 DIAGNOSIS — K859 Acute pancreatitis without necrosis or infection, unspecified: Secondary | ICD-10-CM

## 2021-01-24 NOTE — Telephone Encounter (Signed)
Amanda Crawford, Any update on the appeal for her CT scan?  Rod Holler or covering- In the meantime, I would like for her to repeat her lab work and obtain an abdominal US.  Orders have been placed.

## 2021-01-24 NOTE — Telephone Encounter (Signed)
Opened in error

## 2021-01-25 NOTE — Telephone Encounter (Signed)
Spoke with patient advised her of note below.  She tested positive for covid today and has a virtual visit tomorrow, so after that she will call to schedule lab only visit.

## 2021-01-26 ENCOUNTER — Encounter: Payer: Self-pay | Admitting: Internal Medicine

## 2021-01-26 ENCOUNTER — Telehealth (INDEPENDENT_AMBULATORY_CARE_PROVIDER_SITE_OTHER): Payer: Medicare HMO | Admitting: Internal Medicine

## 2021-01-26 VITALS — BP 126/75 | HR 90 | Temp 97.6°F | Ht 64.0 in | Wt 215.0 lb

## 2021-01-26 DIAGNOSIS — U071 COVID-19: Secondary | ICD-10-CM

## 2021-01-26 MED ORDER — MOLNUPIRAVIR EUA 200MG CAPSULE
4.0000 | ORAL_CAPSULE | Freq: Two times a day (BID) | ORAL | 0 refills | Status: AC
Start: 2021-01-26 — End: 2021-01-31

## 2021-01-26 MED ORDER — BENZONATATE 200 MG PO CAPS: 200.0000 mg | ORAL_CAPSULE | Freq: Three times a day (TID) | ORAL | 0 refills | Status: DC | PRN

## 2021-01-26 NOTE — Progress Notes (Signed)
Subjective:    Patient ID: Amanda Crawford, female    DOB: [birth year], 75 y.o.   MRN: [id number]  DOS:  01/26/2021 Type of visit - description: Virtual Visit via Telephone    I connected with above mentioned patient  by telephone and verified that I am speaking with the correct person using two identifiers.  THIS ENCOUNTER IS A VIRTUAL VISIT DUE TO COVID-19 - PATIENT WAS NOT SEEN IN THE OFFICE. PATIENT HAS CONSENTED TO VIRTUAL VISIT / TELEMEDICINE VISIT   Location of patient: home  Location of provider: office  Persons participating in the virtual visit: patient, provider   I discussed the limitations, risks, security and privacy concerns of performing an evaluation and management service by telephone and the availability of in person appointments. I also discussed with the patient that there may be a patient responsible charge related to this service. The patient expressed understanding and agreed to proceed.  Acute Symptoms a started yesterday with sore throat and a persisting cough. Cough is productive, light green mucus, small amounts. She also had diarrhea yesterday. Energy is low.  She denies fever or chills. Has some shortness of breath which is at baseline + Sinus congestion. No rash.   Review of Systems See above   Past Medical History:  Diagnosis Date   Allergic state 03/12/2015   Arthritis    Colon polyp 11/19/2014   Dermatitis 03/12/2015   Diarrhea 06/29/2016   Diverticulitis    DM (diabetes mellitus), type 2 ([place]) 06/29/2016   GERD (gastroesophageal reflux disease)    occ   Gout 03/12/2015   H/O measles    H/O mumps    Headache(784.0)    migraines   History of chicken pox 11/23/2014   Hyperglycemia 06/29/2016   Lung cancer ([place])    Migraine 11/23/2014   Obesity 11/23/2014   Pneumonia    child   Preventative health care 09/12/2015   Primary hypertension 12/15/2020   Renal cell cancer ([place])    renal cell ca dx 9/01 and 8/08;   Renal insufficiency     Shortness of breath    occ   Skin lesion of breast 03/12/2015    Past Surgical History:  Procedure Laterality Date   ABDOMINAL HYSTERECTOMY     APPENDECTOMY     CARDIAC CATHETERIZATION     yrs ago neg   CHOLECYSTECTOMY     COLONOSCOPY  2018   ESOPHAGOGASTRODUODENOSCOPY     years ago   IR RADIOLOGIST EVAL & MGMT  07/17/2018   IR RADIOLOGIST EVAL & MGMT  09/12/2018   IR RADIOLOGIST EVAL & MGMT  06/18/2019   IR RADIOLOGIST EVAL & MGMT  01/13/2020   IR RADIOLOGIST EVAL & MGMT  07/08/2020   IR RADIOLOGIST EVAL & MGMT  10/12/2020   KIDNEY SURGERY  2001   Removed of left kidney    LUNG CANCER SURGERY Left 08   RADIOLOGY WITH ANESTHESIA N/A 08/21/2018   Procedure: CT WITH ANESTHESIA RENAL CRYOABLATION;  Surgeon: Arne Cleveland, MD;  Location: [facility];  Service: Radiology;  Laterality: N/A;   RENAL MASS EXCISION Left 01   RIGHT/LEFT HEART CATH AND CORONARY ANGIOGRAPHY N/A 07/27/2016   Procedure: Right/Left Heart Cath and Coronary Angiography;  Surgeon: Leonie Man, MD;  Location: [facility] [place] [facility];  Service: Cardiovascular;  Laterality: N/A;   THORACOTOMY Left 05/09/2012   Procedure: THORACOTOMY MAJOR;  Surgeon: Gaye Pollack, MD;  Location: [facility];  Service: Thoracic;  Laterality: Left;   VIDEO BRONCHOSCOPY  N/A 05/09/2012   Procedure: VIDEO BRONCHOSCOPY;  Surgeon: Gaye Pollack, MD;  [facility];  Service: Thoracic;  Laterality: N/A;   WEDGE RESECTION Left 05/09/2012   Procedure: LEFT UPPER LOBE WEDGE RESECTION;  Surgeon: Gaye Pollack, MD;  Location: [facility];  Service: Thoracic;  Laterality: Left;    Allergies as of 01/26/2021   No Known Allergies      Medication List        Accurate as of January 26, 2021  3:46 PM. If you have any questions, ask your nurse or doctor.          allopurinol 300 MG tablet Commonly known as: ZYLOPRIM TAKE 1 TABLET BY MOUTH EVERY DAY   aspirin EC 81 MG tablet Take 81 mg by mouth daily.   atorvastatin 20 MG tablet Commonly known as:  LIPITOR TAKE 1 TABLET BY MOUTH EVERY DAY   benzonatate 200 MG capsule Commonly known as: TESSALON Take 1 capsule (200 mg total) by mouth 3 (three) times daily as needed for cough. Started by: Kathlene November, MD   CALCIUM 600 + D PO Take 1 tablet by mouth daily.   carvedilol 6.25 MG tablet Commonly known as: COREG Take 0.5 tablets (3.125 mg total) by mouth 2 (two) times daily with a meal. Pt needs an OV for future refills   diphenhydramine-acetaminophen 25-500 MG Tabs tablet Commonly known as: TYLENOL PM Take 1 tablet by mouth at bedtime as needed (sleep).   furosemide 40 MG tablet Commonly known as: LASIX TAKE 1 TABLET BY MOUTH EVERY DAY   glimepiride 2 MG tablet Commonly known as: AMARYL TAKE 1 TABLET BY MOUTH EVERY DAY WITH BREAKFAST   hyoscyamine 0.125 MG SL tablet Commonly known as: LEVSIN SL Place 1 tablet (0.125 mg total) under the tongue every 6 (six) hours as needed.   irbesartan 75 MG tablet Commonly known as: AVAPRO TAKE 1 TABLET BY MOUTH EVERY DAY   meclizine 12.5 MG tablet Commonly known as: ANTIVERT Take 1 tablet (12.5 mg total) by mouth 3 (three) times daily as needed for dizziness. As needed for   Dizziness or nausea   metFORMIN 500 MG tablet Commonly known as: GLUCOPHAGE TAKE 1 TABLET BY MOUTH 2 TIMES DAILY WITH A MEAL.   methocarbamol 500 MG tablet Commonly known as: Robaxin Take 1 tablet (500 mg total) by mouth 4 (four) times daily.   molnupiravir EUA 200 mg Caps capsule Commonly known as: LAGEVRIO Take 4 capsules (800 mg total) by mouth 2 (two) times daily for 5 days. Started by: Kathlene November, MD   nystatin cream Commonly known as: MYCOSTATIN Apply 1 application topically 2 (two) times daily.   ondansetron 4 MG disintegrating tablet Commonly known as: ZOFRAN-ODT Take by mouth.   OneTouch Delica Plus [id number] Misc USE AS DIRECTED TO TEST DAILY   OneTouch Verio test strip Generic drug: glucose blood CEHCK BLOOD SUGAR DAILY AS NEEDED   Vitamin  D (Ergocalciferol) 1.25 MG (50000 UNIT) Caps capsule Commonly known as: DRISDOL TAKE 1 CAPSULE (50,000 UNITS TOTAL) BY MOUTH EVERY 7 (SEVEN) DAYS           Objective:   Physical Exam BP 126/75    Pulse 90    Temp 97.6 F (36.4 C)    Ht 5\' 4"  (1.626 m)    Wt 215 lb (97.5 kg)    BMI 36.90 kg/m  This is telephone visit, patient was unable to get in the computer. Vital signs are stable, she sounded well, in  no distress, speaking in complete sentences, no cough     Assessment     75 year old female, PMH includes morbid obesity, nonischemic cardiomyopathy, history of kidney cancer, diabetes, high cholesterol, HTN,  among other issues, presents with  COVID infection: Last COVID-vaccine 10-2020, symptoms a started yesterday, tested positive yesterday.  She does not sound toxic on the phone. We agreed on the following: Antivirals, molnupiravir Cough control with Mucinex DM, Tessalon Perles, prescription sent. Nasal congestion: Astepro OTC Isolate for 7 days Call if not gradually better. She does not have asthma and is not a smoker but has leftover inhaler, okay to use if she hears wheezing or has persisting cough. Patient verbalized understanding at my request she took notes regards instructions.    I discussed the assessment and treatment plan with the patient. The patient was provided an opportunity to ask questions and all were answered. The patient agreed with the plan and demonstrated an understanding of the instructions.   The patient was advised to call back or seek an in-person evaluation if the symptoms worsen or if the condition fails to improve as anticipated.  I provided 18 minutes of non-face-to-face time during this encounter.  Kathlene November, MD

## 2021-02-02 NOTE — Telephone Encounter (Signed)
Any Update? tks

## 2021-02-07 ENCOUNTER — Ambulatory Visit (HOSPITAL_BASED_OUTPATIENT_CLINIC_OR_DEPARTMENT_OTHER)
Admission: RE | Admit: 2021-02-07 | Discharge: 2021-02-07 | Disposition: A | Discharge status: Home / Self Care | Payer: No Typology Code available for payment source | Source: Ambulatory Visit | Attending: Family | Admitting: Family

## 2021-02-07 ENCOUNTER — Other Ambulatory Visit: Payer: Self-pay

## 2021-02-07 DIAGNOSIS — I700 Atherosclerosis of aorta: Secondary | ICD-10-CM | POA: Diagnosis not present

## 2021-02-07 DIAGNOSIS — K760 Fatty (change of) liver, not elsewhere classified: Secondary | ICD-10-CM | POA: Diagnosis not present

## 2021-02-07 DIAGNOSIS — K859 Acute pancreatitis without necrosis or infection, unspecified: Secondary | ICD-10-CM | POA: Diagnosis not present

## 2021-02-08 ENCOUNTER — Telehealth: Payer: Self-pay | Admitting: Family

## 2021-02-08 DIAGNOSIS — R748 Abnormal levels of other serum enzymes: Secondary | ICD-10-CM

## 2021-02-08 DIAGNOSIS — K7460 Unspecified cirrhosis of liver: Secondary | ICD-10-CM

## 2021-02-08 NOTE — Telephone Encounter (Signed)
Called but no answer, lvm for patient to call back about results

## 2021-02-08 NOTE — Telephone Encounter (Signed)
Please advise pt that the area of the pancreas that was seen on ultrasound looks good.  Again noted some scarring in her liver- this was addressed by Dr. Bryan Lemma back in 2021 and it looks like he wanted to see her back.  I would recommend that we get her back in with Dr. Bryan Lemma.  I am still waiting on the insurance appeal for her CT scan and am told it can take up to 60 days.  In the meantime, I would like her to complete ordered lab work.

## 2021-02-08 NOTE — Telephone Encounter (Signed)
Patient advised of results and to call Dr. Bryan Lemma for follow up

## 2021-02-14 ENCOUNTER — Other Ambulatory Visit: Payer: Self-pay | Admitting: Family Medicine

## 2021-02-23 NOTE — Progress Notes (Signed)
Cardiology Office Note:    Date:  02/24/2021   ID:  COREE RIESTER, DOB [birth year], MRN [id number]  PCP:  [name], [place] Cardiologist: Pixie Casino, MD   Reason for visit: Hospital follow-up  History of Present Illness:    Amanda Crawford is a 76 y.o. female with a hx of renal cell carcinoma status post nephrectomy, hyperlipidemia, heart failure with intermediate EF, left bundle branch block, nonobstructive coronary disease by cath.  She last saw Dr. Debara Pickett in February 2022 and was doing well with NYHA class I-II symptoms.   She went to the ED with epigastric pain, nausea and vomiting.  Troponins were minimally elevated but not typical for ACS presentation patient was discharged from the ED.  Today, she is doing well.  Weight has been stable.  She denies PND, orthopnea or lower extremity edema.  She takes Lasix 40 mg daily.  Her last creatinine was 1.28 in November 2022 which was down from her previous.  She has chronic dyspnea on exertion.  Is difficult for her to walk a flight of stairs.  She knows she needs to increase her walking that would help get her weight off.  She states has been a difficult 6 months as she has had flu and COVID.  She denies chest pain.  Her LDL was 74 in September on Lipitor.  She has elevated triglycerides but states she had a reaction to fish oil in the past.  She has a annual physical coming up with her PCP to further address.    Past Medical History:  Diagnosis Date   Allergic state 03/12/2015   Arthritis    Colon polyp 11/19/2014   Dermatitis 03/12/2015   Diarrhea 06/29/2016   Diverticulitis    DM (diabetes mellitus), type 2 ([place]) 06/29/2016   GERD (gastroesophageal reflux disease)    occ   Gout 03/12/2015   H/O measles    H/O mumps    Headache(784.0)    migraines   History of chicken pox 11/23/2014   Hyperglycemia 06/29/2016   Lung cancer ([place])    Migraine 11/23/2014   Obesity 11/23/2014   Pneumonia    child    Preventative health care 09/12/2015   Primary hypertension 12/15/2020   Renal cell cancer ([place])    renal cell ca dx 9/01 and 8/08;   Renal insufficiency    Shortness of breath    occ   Skin lesion of breast 03/12/2015    Past Surgical History:  Procedure Laterality Date   ABDOMINAL HYSTERECTOMY     APPENDECTOMY     CARDIAC CATHETERIZATION     yrs ago neg   CHOLECYSTECTOMY     COLONOSCOPY  2018   ESOPHAGOGASTRODUODENOSCOPY     years ago   IR RADIOLOGIST EVAL & MGMT  07/17/2018   IR RADIOLOGIST EVAL & MGMT  09/12/2018   IR RADIOLOGIST EVAL & MGMT  06/18/2019   IR RADIOLOGIST EVAL & MGMT  01/13/2020   IR RADIOLOGIST EVAL & MGMT  07/08/2020   IR RADIOLOGIST EVAL & MGMT  10/12/2020   KIDNEY SURGERY  2001   Removed of left kidney    LUNG CANCER SURGERY Left 08   RADIOLOGY WITH ANESTHESIA N/A 08/21/2018   Procedure: CT WITH ANESTHESIA RENAL CRYOABLATION;  Surgeon: Arne Cleveland, MD;  [facility];  Service: Radiology;  Laterality: N/A;   RENAL MASS EXCISION Left 01   RIGHT/LEFT HEART CATH AND CORONARY ANGIOGRAPHY N/A 07/27/2016   Procedure: Right/Left  Heart Cath and Coronary Angiography;  Surgeon: Leonie Man, MD;  Location: [facility];  Service: Cardiovascular;  Laterality: N/A;   THORACOTOMY Left 05/09/2012   Procedure: THORACOTOMY MAJOR;  Surgeon: Gaye Pollack, MD;  [facility];  Service: Thoracic;  Laterality: Left;   VIDEO BRONCHOSCOPY N/A 05/09/2012   Procedure: VIDEO BRONCHOSCOPY;  Surgeon: Gaye Pollack, MD;  [facility];  Service: Thoracic;  Laterality: N/A;   WEDGE RESECTION Left 05/09/2012   Procedure: LEFT UPPER LOBE WEDGE RESECTION;  Surgeon: Gaye Pollack, MD;  [facility];  Service: Thoracic;  Laterality: Left;    Current Medications: Current Meds  Medication Sig   albuterol (VENTOLIN HFA) 108 (90 Base) MCG/ACT inhaler PLEASE SEE ATTACHED FOR DETAILED DIRECTIONS   allopurinol (ZYLOPRIM) 300 MG tablet TAKE 1 TABLET BY MOUTH EVERY DAY    aspirin EC 81 MG tablet Take 81 mg by mouth daily.   atorvastatin (LIPITOR) 20 MG tablet TAKE 1 TABLET BY MOUTH EVERY DAY   Calcium Carb-Cholecalciferol (CALCIUM 600 + D PO) Take 1 tablet by mouth daily.   carvedilol (COREG) 6.25 MG tablet Take 0.5 tablets (3.125 mg total) by mouth 2 (two) times daily with a meal. Pt needs an OV for future refills   diphenhydramine-acetaminophen (TYLENOL PM) 25-500 MG TABS Take 1 tablet by mouth at bedtime as needed (sleep).    furosemide (LASIX) 40 MG tablet TAKE 1 TABLET BY MOUTH EVERY DAY   glimepiride (AMARYL) 2 MG tablet TAKE 1 TABLET BY MOUTH EVERY DAY WITH BREAKFAST   hyoscyamine (LEVSIN SL) 0.125 MG SL tablet Place 1 tablet (0.125 mg total) under the tongue every 6 (six) hours as needed.   irbesartan (AVAPRO) 75 MG tablet TAKE 1 TABLET BY MOUTH EVERY DAY   Lancets (ONETOUCH DELICA PLUS [id number]) MISC USE AS DIRECTED TO TEST DAILY   meclizine (ANTIVERT) 12.5 MG tablet Take 1 tablet (12.5 mg total) by mouth 3 (three) times daily as needed for dizziness. As needed for   Dizziness or nausea   metFORMIN (GLUCOPHAGE) 500 MG tablet TAKE 1 TABLET BY MOUTH 2 TIMES DAILY WITH A MEAL.   methocarbamol (ROBAXIN) 500 MG tablet Take 1 tablet (500 mg total) by mouth 4 (four) times daily.   nystatin cream (MYCOSTATIN) Apply 1 application topically 2 (two) times daily.   ondansetron (ZOFRAN-ODT) 4 MG disintegrating tablet Take by mouth.   ONETOUCH VERIO test strip CEHCK BLOOD SUGAR DAILY AS NEEDED   Vitamin D, Ergocalciferol, (DRISDOL) 1.25 MG (50000 UNIT) CAPS capsule TAKE 1 CAPSULE (50,000 UNITS TOTAL) BY MOUTH EVERY 7 (SEVEN) DAYS   Vitamin D, Ergocalciferol, 50000 units CAPS Take 1 capsule by mouth every 7 (seven) days.     Allergies:   Patient has no known allergies.   Social History   Socioeconomic History   Marital status: Married    Spouse name: Not on file   Number of children: Not on file   Years of education: Not on file   Highest education level: Not  on file  Occupational History   Occupation: retired  Tobacco Use   Smoking status: Former    Packs/day: 0.50    Years: 35.00    Pack years: 17.50    Types: Cigarettes    Quit date: 05/08/2006    Years since quitting: 14.8   Smokeless tobacco: Never  Vaping Use   Vaping Use: Never used  Substance and Sexual Activity   Alcohol use: Yes    Comment: seldomly  Drug use: No   Sexual activity: Not on file    Comment: lives with husband, no dietary restrictions.   Other Topics Concern   Not on file  Social History Narrative   Not on file   Social Determinants of Health   Financial Resource Strain: Low Risk    Difficulty of Paying Living Expenses: Not hard at all  Food Insecurity: No Food Insecurity   Worried About Charity fundraiser in the Last Year: Never true   [place] in the Last Year: Never true  Transportation Needs: No Transportation Needs   Lack of Transportation (Medical): No   Lack of Transportation (Non-Medical): No  Physical Activity: Inactive   Days of Exercise per Week: 0 days   Minutes of Exercise per Session: 0 min  Stress: No Stress Concern Present   Feeling of Stress : Only a little  Social Connections: Moderately Integrated   Frequency of Communication with Friends and Family: More than three times a week   Frequency of Social Gatherings with Friends and Family: Once a week   Attends Religious Services: More than 4 times per year   Active Member of [employer] or Organizations: No   Attends Archivist Meetings: Never   Marital Status: Married     Family History: The patient's family history includes Alcohol abuse in her maternal aunt; Asthma in her brother and daughter; Cancer in her paternal grandmother; Colon cancer in her paternal aunt; Dementia in her mother; Diabetes in her father; Hearing loss in her paternal grandfather; Heart disease in her father; Heart failure in her father; Hyperlipidemia in her father; Hypertension in her father;  Stroke in her paternal grandfather.  ROS:   Please see the history of present illness.     EKGs/Labs/Other Studies Reviewed:    EKG: Normal sinus rhythm, left bundle branch block, heart rate 91, PR interval 152 ms, QRS duration 142 ms.  Recent Labs: 10/14/2020: TSH 2.31 12/15/2020: ALT 29; BUN 25; Creatinine, Ser 1.28; Hemoglobin 10.8; Platelets 177.0; Potassium 4.7; Sodium 138   Recent Lipid Panel Lab Results  Component Value Date/Time   CHOL 177 10/14/2020 04:28 PM   CHOL 193 09/30/2019 10:14 AM   TRIG 303.0 (H) 10/14/2020 04:28 PM   TRIG 267 (H) 09/30/2019 10:14 AM   HDL 51.70 10/14/2020 04:28 PM   LDLCALC (H) 01/31/2007 08:00 AM    117        Total Cholesterol/HDL:CHD Risk Coronary Heart Disease Risk Table                     Men   Women  1/2 Average Risk   3.4   3.3  Average Risk       5.0   4.4  2 X Average Risk   9.6   7.1  3 X Average Risk  23.4   11.0        Use the calculated Patient Ratio above and the CHD Risk Table to determine the patient's CHD Risk.        ATP III CLASSIFICATION (LDL):  <100     mg/dL   Optimal  100-129  mg/dL   Near or Above                    Optimal  130-159  mg/dL   Borderline  160-189  mg/dL   High  >190     mg/dL   Very High   LDLDIRECT 74.0 10/14/2020 04:28  PM    Physical Exam:    VS:  BP 105/62    Pulse 91    Ht 5\' 4"  (1.626 m)    Wt 218 lb 12.8 oz (99.2 kg)    SpO2 97%    BMI 37.56 kg/m    No data found.  Wt Readings from Last 3 Encounters:  02/24/21 218 lb 12.8 oz (99.2 kg)  01/26/21 215 lb (97.5 kg)  12/15/20 218 lb (98.9 kg)     GEN:  Well nourished, well developed in no acute distress, obese HEENT: Normal NECK: No JVD; No carotid bruits CARDIAC: RRR, no murmurs, rubs, gallops RESPIRATORY:  Clear to auscultation without rales, wheezing or rhonchi  ABDOMEN: Soft, non-tender, non-distended MUSCULOSKELETAL: No edema; No deformity  SKIN: Warm and dry NEUROLOGIC:  Alert and oriented PSYCHIATRIC:  Normal affect      ASSESSMENT AND PLAN   Nonischemic cardiomyopathy with recovered EF, euvolemic -Left bundle branch block -2D echo February 2022 with EF 55 to 60%, moderate LVH, normal RV, no significant valve disease -Continue low-dose Coreg and irbesartan.  Continue Lasix.  Nonobstructive CAD with no angina -By left heart cath in 2018 (25% stenosis in mid LAD) -Continue beta-blocker, aspirin and statin therapy.  Hypertension, well controlled -Continue current medications. -Goal BP is <130/80.  Recommend DASH diet (high in vegetables, fruits, low-fat dairy products, whole grains, poultry, fish, and nuts and low in sweets, sugar-sweetened beverages, and red meats), salt restriction and increase physical activity.  Hyperlipidemia  -LDL 74 in September 2022.  Continue Lipitor. -Printed patient education on hypertriglyceridemia.  Obesity -Gave information on [place] and our [employer] prep program. -Discussed how even a 5-10% weight loss can have cardiovascular benefits.   -Recommend moderate intensity activity for 30 minutes 5 days/week and the DASH diet.  Disposition - Follow-up in 1 year with Dr. Debara Pickett.        Medication Adjustments/Labs and Tests Ordered: Current medicines are reviewed at length with the patient today.  Concerns regarding medicines are outlined above.  Orders Placed This Encounter  Procedures   Amb Referral To Provider Referral Exercise Program (P.R.E.P)   EKG 12-Lead   No orders of the defined types were placed in this encounter.   Patient Instructions  Medication Instructions:  No changes *If you need a refill on your cardiac medications before your next appointment, please call your pharmacy*   Lab Work: No Labs If you have labs (blood work) drawn today and your tests are completely normal, you will receive your results only by: [place] (if you have MyChart) OR A paper copy in the mail If you have any lab test that is abnormal or  we need to change your treatment, we will call you to review the results.   Testing/Procedures: No Testing   Follow-Up: At [facility], you and your health needs are our priority.  As part of our continuing mission to provide you with exceptional heart care, we have created designated Provider Care Teams.  These Care Teams include your primary Cardiologist (physician) and Advanced Practice Providers (APPs -  Physician Assistants and Nurse Practitioners) who all work together to provide you with the care you need, when you need it.  We recommend signing up for the patient portal called "MyChart".  Sign up information is provided on this After Visit Summary.  MyChart is used to connect with patients for Virtual Visits (Telemedicine).  Patients are able to view lab/test results, encounter notes, upcoming appointments, etc.  Non-urgent messages can be sent to your provider as well.   To learn more about what you can do with MyChart, go to [email or web address].    Your next appointment:   1 year(s)  The format for your next appointment:   In Person  Provider:   Pixie Casino, MD         Signed, Warren Lacy, PA-C  02/24/2021 10:10 AM    [place]

## 2021-02-24 ENCOUNTER — Encounter: Payer: Self-pay | Admitting: Physician Assistant

## 2021-02-24 ENCOUNTER — Other Ambulatory Visit: Payer: Self-pay

## 2021-02-24 ENCOUNTER — Ambulatory Visit (INDEPENDENT_AMBULATORY_CARE_PROVIDER_SITE_OTHER): Payer: No Typology Code available for payment source | Admitting: Physician Assistant

## 2021-02-24 VITALS — BP 105/62 | HR 91 | Ht 64.0 in | Wt 218.8 lb

## 2021-02-24 DIAGNOSIS — I428 Other cardiomyopathies: Secondary | ICD-10-CM | POA: Diagnosis not present

## 2021-02-24 DIAGNOSIS — I2510 Atherosclerotic heart disease of native coronary artery without angina pectoris: Secondary | ICD-10-CM | POA: Diagnosis not present

## 2021-02-24 DIAGNOSIS — I10 Essential (primary) hypertension: Secondary | ICD-10-CM | POA: Diagnosis not present

## 2021-02-24 DIAGNOSIS — E785 Hyperlipidemia, unspecified: Secondary | ICD-10-CM | POA: Diagnosis not present

## 2021-02-24 NOTE — Patient Instructions (Signed)
Medication Instructions:  No changes *If you need a refill on your cardiac medications before your next appointment, please call your pharmacy*   Lab Work: No Labs If you have labs (blood work) drawn today and your tests are completely normal, you will receive your results only by: [place] (if you have MyChart) OR A paper copy in the mail If you have any lab test that is abnormal or we need to change your treatment, we will call you to review the results.   Testing/Procedures: No Testing   Follow-Up: At [facility], you and your health needs are our priority.  As part of our continuing mission to provide you with exceptional heart care, we have created designated Provider Care Teams.  These Care Teams include your primary Cardiologist (physician) and Advanced Practice Providers (APPs -  Physician Assistants and Nurse Practitioners) who all work together to provide you with the care you need, when you need it.  We recommend signing up for the patient portal called "MyChart".  Sign up information is provided on this After Visit Summary.  MyChart is used to connect with patients for Virtual Visits (Telemedicine).  Patients are able to view lab/test results, encounter notes, upcoming appointments, etc.  Non-urgent messages can be sent to your provider as well.   To learn more about what you can do with MyChart, go to [email or web address].    Your next appointment:   1 year(s)  The format for your next appointment:   In Person  Provider:   Pixie Casino, MD

## 2021-02-25 ENCOUNTER — Telehealth: Payer: Self-pay

## 2021-02-25 NOTE — Telephone Encounter (Signed)
Returned her call, she was leaving for an appointment, will call me back at her convenience; PREP program referral

## 2021-02-25 NOTE — Telephone Encounter (Signed)
Called to discuss PREP program referral, left voicemail  

## 2021-03-03 ENCOUNTER — Ambulatory Visit (INDEPENDENT_AMBULATORY_CARE_PROVIDER_SITE_OTHER): Payer: No Typology Code available for payment source | Admitting: Family Medicine

## 2021-03-03 ENCOUNTER — Encounter: Payer: Self-pay | Admitting: Family Medicine

## 2021-03-03 VITALS — BP 130/66 | HR 84 | Temp 98.1°F | Resp 16 | Wt 218.0 lb

## 2021-03-03 DIAGNOSIS — E785 Hyperlipidemia, unspecified: Secondary | ICD-10-CM

## 2021-03-03 DIAGNOSIS — M1A9XX0 Chronic gout, unspecified, without tophus (tophi): Secondary | ICD-10-CM | POA: Diagnosis not present

## 2021-03-03 DIAGNOSIS — D539 Nutritional anemia, unspecified: Secondary | ICD-10-CM | POA: Diagnosis not present

## 2021-03-03 DIAGNOSIS — D649 Anemia, unspecified: Secondary | ICD-10-CM

## 2021-03-03 DIAGNOSIS — I10 Essential (primary) hypertension: Secondary | ICD-10-CM | POA: Diagnosis not present

## 2021-03-03 DIAGNOSIS — E559 Vitamin D deficiency, unspecified: Secondary | ICD-10-CM | POA: Diagnosis not present

## 2021-03-03 DIAGNOSIS — N289 Disorder of kidney and ureter, unspecified: Secondary | ICD-10-CM

## 2021-03-03 DIAGNOSIS — R748 Abnormal levels of other serum enzymes: Secondary | ICD-10-CM

## 2021-03-03 DIAGNOSIS — M25551 Pain in right hip: Secondary | ICD-10-CM

## 2021-03-03 DIAGNOSIS — C649 Malignant neoplasm of unspecified kidney, except renal pelvis: Secondary | ICD-10-CM

## 2021-03-03 DIAGNOSIS — E119 Type 2 diabetes mellitus without complications: Secondary | ICD-10-CM

## 2021-03-03 DIAGNOSIS — R7989 Other specified abnormal findings of blood chemistry: Secondary | ICD-10-CM

## 2021-03-03 DIAGNOSIS — R059 Cough, unspecified: Secondary | ICD-10-CM | POA: Diagnosis not present

## 2021-03-03 DIAGNOSIS — K219 Gastro-esophageal reflux disease without esophagitis: Secondary | ICD-10-CM

## 2021-03-03 DIAGNOSIS — R197 Diarrhea, unspecified: Secondary | ICD-10-CM | POA: Diagnosis not present

## 2021-03-03 LAB — COMPREHENSIVE METABOLIC PANEL
ALT: 37 U/L — ABNORMAL HIGH (ref 0–35)
AST: 45 U/L — ABNORMAL HIGH (ref 0–37)
Albumin: 4.3 g/dL (ref 3.5–5.2)
Alkaline Phosphatase: 61 U/L (ref 39–117)
BUN: 29 mg/dL — ABNORMAL HIGH (ref 6–23)
CO2: 30 mEq/L (ref 19–32)
Calcium: 11 mg/dL — ABNORMAL HIGH (ref 8.4–10.5)
Chloride: 102 mEq/L (ref 96–112)
Creatinine, Ser: 1.25 mg/dL — ABNORMAL HIGH (ref 0.40–1.20)
GFR: 41.48 mL/min — ABNORMAL LOW (ref >60.00)
Glucose, Bld: 135 mg/dL — ABNORMAL HIGH (ref 70–99)
Potassium: 4.9 mEq/L (ref 3.5–5.1)
Sodium: 138 mEq/L (ref 135–145)
Total Bilirubin: 0.8 mg/dL (ref 0.2–1.2)
Total Protein: 7.2 g/dL (ref 6.0–8.3)

## 2021-03-03 LAB — URIC ACID: Uric Acid, Serum: 5.3 mg/dL (ref 2.4–7.0)

## 2021-03-03 LAB — LIPID PANEL
Cholesterol: 190 mg/dL (ref 0–200)
HDL: 52.6 mg/dL (ref >39.00)
NonHDL: 137.62
Total CHOL/HDL Ratio: 4
Triglycerides: 319 mg/dL — ABNORMAL HIGH (ref 0.0–149.0)
VLDL: 63.8 mg/dL — ABNORMAL HIGH (ref 0.0–40.0)

## 2021-03-03 LAB — VITAMIN D 25 HYDROXY (VIT D DEFICIENCY, FRACTURES): VITD: 64.76 ng/mL (ref 30.00–100.00)

## 2021-03-03 LAB — TSH: TSH: 2.86 u[IU]/mL (ref 0.35–5.50)

## 2021-03-03 LAB — CBC
HCT: 36.6 % (ref 36.0–46.0)
Hemoglobin: 12 g/dL (ref 12.0–15.0)
MCHC: 32.7 g/dL (ref 30.0–36.0)
MCV: 99.7 fl (ref 78.0–100.0)
Platelets: 176 10*3/uL (ref 150.0–400.0)
RBC: 3.67 Mil/uL — ABNORMAL LOW (ref 3.87–5.11)
RDW: 16.5 % — ABNORMAL HIGH (ref 11.5–15.5)
WBC: 7.3 10*3/uL (ref 4.0–10.5)

## 2021-03-03 LAB — LDL CHOLESTEROL, DIRECT: Direct LDL: 81 mg/dL

## 2021-03-03 LAB — HEMOGLOBIN A1C: Hgb A1c MFr Bld: 6.4 % (ref 4.6–6.5)

## 2021-03-03 LAB — LIPASE: Lipase: 114 U/L — ABNORMAL HIGH (ref 11.0–59.0)

## 2021-03-03 LAB — VITAMIN B12: Vitamin B-12: 557 pg/mL (ref 211–911)

## 2021-03-03 NOTE — Assessment & Plan Note (Signed)
Supplement and monitor 

## 2021-03-03 NOTE — Assessment & Plan Note (Signed)
Hydrate and monitor 

## 2021-03-03 NOTE — Progress Notes (Signed)
Subjective:    Patient ID: Amanda Crawford, female    DOB: [birth year], 76 y.o.   MRN: [id number]  Chief Complaint  Patient presents with   3 months follow up    HPI Patient is in today for follow up on chronic medical concerns. No recent febrile illness or hospitalizations. Notes persistent trouble with loose stool,no every day but certainly every week and struggles with episodes of incontinence when this occurs. She had Covid recently and the flu back in November and pneumonia before that so she has had a hard time. She still has mild congestion with clear rhinorrhea a mild cough and occasional sneezing. No fevers or chills. She notes significant right hip pain the last month but denies any falls or trauma. Denies CP/palp/SOB/HA/congestion/fevers or GU c/o. Taking meds as prescribed   Past Medical History:  Diagnosis Date   Allergic state 03/12/2015   Arthritis    Colon polyp 11/19/2014   Dermatitis 03/12/2015   Diarrhea 06/29/2016   Diverticulitis    DM (diabetes mellitus), type 2 ([place]) 06/29/2016   GERD (gastroesophageal reflux disease)    occ   Gout 03/12/2015   H/O measles    H/O mumps    Headache(784.0)    migraines   History of chicken pox 11/23/2014   Hyperglycemia 06/29/2016   Lung cancer ([place])    Migraine 11/23/2014   Obesity 11/23/2014   Pneumonia    child   Preventative health care 09/12/2015   Primary hypertension 12/15/2020   Renal cell cancer ([place])    renal cell ca dx 9/01 and 8/08;   Renal insufficiency    Shortness of breath    occ   Skin lesion of breast 03/12/2015    Past Surgical History:  Procedure Laterality Date   ABDOMINAL HYSTERECTOMY     APPENDECTOMY     CARDIAC CATHETERIZATION     yrs ago neg   CHOLECYSTECTOMY     COLONOSCOPY  2018   ESOPHAGOGASTRODUODENOSCOPY     years ago   IR RADIOLOGIST EVAL & MGMT  07/17/2018   IR RADIOLOGIST EVAL & MGMT  09/12/2018   IR RADIOLOGIST EVAL & MGMT  06/18/2019   IR RADIOLOGIST EVAL & MGMT  01/13/2020   IR  RADIOLOGIST EVAL & MGMT  07/08/2020   IR RADIOLOGIST EVAL & MGMT  10/12/2020   KIDNEY SURGERY  2001   Removed of left kidney    LUNG CANCER SURGERY Left 08   RADIOLOGY WITH ANESTHESIA N/A 08/21/2018   Procedure: CT WITH ANESTHESIA RENAL CRYOABLATION;  Surgeon: Arne Cleveland, MD;  Location: [facility];  Service: Radiology;  Laterality: N/A;   RENAL MASS EXCISION Left 01   RIGHT/LEFT HEART CATH AND CORONARY ANGIOGRAPHY N/A 07/27/2016   Procedure: Right/Left Heart Cath and Coronary Angiography;  Surgeon: Leonie Man, MD;  Location: [facility];  Service: Cardiovascular;  Laterality: N/A;   THORACOTOMY Left 05/09/2012   Procedure: THORACOTOMY MAJOR;  Surgeon: Gaye Pollack, MD;  Location: [facility];  Service: Thoracic;  Laterality: Left;   VIDEO BRONCHOSCOPY N/A 05/09/2012   Procedure: VIDEO BRONCHOSCOPY;  Surgeon: Gaye Pollack, MD;  Location: [facility];  Service: Thoracic;  Laterality: N/A;   WEDGE RESECTION Left 05/09/2012   Procedure: LEFT UPPER LOBE WEDGE RESECTION;  Surgeon: Gaye Pollack, MD;  Location: [facility];  Service: Thoracic;  Laterality: Left;    Family History  Problem Relation Age of Onset   Dementia Mother    Heart failure Father  Diabetes Father    Hyperlipidemia Father    Heart disease Father    Hypertension Father    Asthma Brother    Asthma Daughter    Alcohol abuse Maternal Aunt    Colon cancer Paternal Aunt    Cancer Paternal Grandmother    Hearing loss Paternal Grandfather    Stroke Paternal Grandfather     Social History   Socioeconomic History   Marital status: Married    Spouse name: Not on file   Number of children: Not on file   Years of education: Not on file   Highest education level: Not on file  Occupational History   Occupation: retired  Tobacco Use   Smoking status: Former    Packs/day: 0.50    Years: 35.00    Pack years: 17.50    Types: Cigarettes    Quit date: 05/08/2006    Years since quitting: 14.8   Smokeless tobacco: Never  Vaping  Use   Vaping Use: Never used  Substance and Sexual Activity   Alcohol use: Yes    Comment: seldomly   Drug use: No   Sexual activity: Not on file    Comment: lives with husband, no dietary restrictions.   Other Topics Concern   Not on file  Social History Narrative   Not on file   Social Determinants of Health   Financial Resource Strain: Low Risk    Difficulty of Paying Living Expenses: Not hard at all  Food Insecurity: No Food Insecurity   Worried About Charity fundraiser in the Last Year: Never true   [place] in the Last Year: Never true  Transportation Needs: No Transportation Needs   Lack of Transportation (Medical): No   Lack of Transportation (Non-Medical): No  Physical Activity: Inactive   Days of Exercise per Week: 0 days   Minutes of Exercise per Session: 0 min  Stress: No Stress Concern Present   Feeling of Stress : Only a little  Social Connections: Moderately Integrated   Frequency of Communication with Friends and Family: More than three times a week   Frequency of Social Gatherings with Friends and Family: Once a week   Attends Religious Services: More than 4 times per year   Active Member of [employer] or Organizations: No   Attends Archivist Meetings: Never   Marital Status: Married  Human resources officer Violence: Not At Risk   Fear of Current or Ex-Partner: No   Emotionally Abused: No   Physically Abused: No   Sexually Abused: No    Outpatient Medications Prior to Visit  Medication Sig Dispense Refill   albuterol (VENTOLIN HFA) 108 (90 Base) MCG/ACT inhaler PLEASE SEE ATTACHED FOR DETAILED DIRECTIONS     allopurinol (ZYLOPRIM) 300 MG tablet TAKE 1 TABLET BY MOUTH EVERY DAY 90 tablet 1   aspirin EC 81 MG tablet Take 81 mg by mouth daily.     atorvastatin (LIPITOR) 20 MG tablet TAKE 1 TABLET BY MOUTH EVERY DAY 90 tablet 1   benzonatate (TESSALON) 200 MG capsule Take 1 capsule (200 mg total) by mouth 3 (three) times daily as needed for cough.  20 capsule 0   Calcium Carb-Cholecalciferol (CALCIUM 600 + D PO) Take 1 tablet by mouth daily.     carvedilol (COREG) 6.25 MG tablet Take 0.5 tablets (3.125 mg total) by mouth 2 (two) times daily with a meal. Pt needs an OV for future refills 180 tablet 1   diphenhydramine-acetaminophen (TYLENOL PM) 25-500  MG TABS Take 1 tablet by mouth at bedtime as needed (sleep).      furosemide (LASIX) 40 MG tablet TAKE 1 TABLET BY MOUTH EVERY DAY 90 tablet 3   glimepiride (AMARYL) 2 MG tablet TAKE 1 TABLET BY MOUTH EVERY DAY WITH BREAKFAST 90 tablet 1   hyoscyamine (LEVSIN SL) 0.125 MG SL tablet Place 1 tablet (0.125 mg total) under the tongue every 6 (six) hours as needed. 30 tablet 1   irbesartan (AVAPRO) 75 MG tablet TAKE 1 TABLET BY MOUTH EVERY DAY 90 tablet 1   Lancets (ONETOUCH DELICA PLUS OACZYS06T) MISC USE AS DIRECTED TO TEST DAILY 100 each 12   meclizine (ANTIVERT) 12.5 MG tablet Take 1 tablet (12.5 mg total) by mouth 3 (three) times daily as needed for dizziness. As needed for   Dizziness or nausea 30 tablet 1   metFORMIN (GLUCOPHAGE) 500 MG tablet TAKE 1 TABLET BY MOUTH 2 TIMES DAILY WITH A MEAL. 180 tablet 1   methocarbamol (ROBAXIN) 500 MG tablet Take 1 tablet (500 mg total) by mouth 4 (four) times daily. 45 tablet 1   nystatin cream (MYCOSTATIN) Apply 1 application topically 2 (two) times daily. 30 g 1   ondansetron (ZOFRAN-ODT) 4 MG disintegrating tablet Take by mouth.     ONETOUCH VERIO test strip CEHCK BLOOD SUGAR DAILY AS NEEDED 100 strip 12   Vitamin D, Ergocalciferol, (DRISDOL) 1.25 MG (50000 UNIT) CAPS capsule TAKE 1 CAPSULE (50,000 UNITS TOTAL) BY MOUTH EVERY 7 (SEVEN) DAYS 12 capsule 1   Vitamin D, Ergocalciferol, 50000 units CAPS Take 1 capsule by mouth every 7 (seven) days.     No facility-administered medications prior to visit.    No Known Allergies  Review of Systems  Constitutional:  Positive for malaise/fatigue. Negative for fever.  HENT:  Positive for sinus pain. Negative  for congestion.   Eyes:  Negative for blurred vision.  Respiratory:  Positive for cough and sputum production. Negative for shortness of breath.   Cardiovascular:  Negative for chest pain, palpitations and leg swelling.  Gastrointestinal:  Positive for abdominal pain and diarrhea. Negative for blood in stool and nausea.  Genitourinary:  Positive for frequency. Negative for dysuria.  Musculoskeletal:  Negative for falls.  Skin:  Negative for rash.  Neurological:  Negative for dizziness, loss of consciousness and headaches.  Endo/Heme/Allergies:  Negative for environmental allergies.  Psychiatric/Behavioral:  Negative for depression. The patient is not nervous/anxious.       Objective:    Physical Exam Constitutional:      General: She is not in acute distress.    Appearance: She is well-developed.  HENT:     Head: Normocephalic and atraumatic.  Eyes:     Conjunctiva/sclera: Conjunctivae normal.  Neck:     Thyroid: No thyromegaly.  Cardiovascular:     Rate and Rhythm: Normal rate and regular rhythm.     Heart sounds: Normal heart sounds. No murmur heard. Pulmonary:     Effort: Pulmonary effort is normal. No respiratory distress.     Breath sounds: Normal breath sounds.  Abdominal:     General: Bowel sounds are normal. There is no distension.     Palpations: Abdomen is soft. There is no mass.     Tenderness: There is no abdominal tenderness.  Musculoskeletal:     Cervical back: Neck supple.  Lymphadenopathy:     Cervical: No cervical adenopathy.  Skin:    General: Skin is warm and dry.  Neurological:     Mental  Status: She is alert and oriented to person, place, and time.  Psychiatric:        Behavior: Behavior normal.    BP 130/66    Pulse 84    Temp 98.1 F (36.7 C)    Resp 16    Wt 218 lb (98.9 kg)    SpO2 91%    BMI 37.42 kg/m  Wt Readings from Last 3 Encounters:  03/03/21 218 lb (98.9 kg)  02/24/21 218 lb 12.8 oz (99.2 kg)  01/26/21 215 lb (97.5 kg)     Diabetic Foot Exam - Simple   No data filed    Lab Results  Component Value Date   WBC 6.7 12/15/2020   HGB 10.8 (L) 12/15/2020   HCT 33.3 (L) 12/15/2020   PLT 177.0 12/15/2020   GLUCOSE 158 (H) 12/15/2020   CHOL 177 10/14/2020   TRIG 303.0 (H) 10/14/2020   HDL 51.70 10/14/2020   LDLDIRECT 74.0 10/14/2020   LDLCALC (H) 01/31/2007    117        Total Cholesterol/HDL:CHD Risk Coronary Heart Disease Risk Table                     Men   Women  1/2 Average Risk   3.4   3.3  Average Risk       5.0   4.4  2 X Average Risk   9.6   7.1  3 X Average Risk  23.4   11.0        Use the calculated Patient Ratio above and the CHD Risk Table to determine the patient's CHD Risk.        ATP III CLASSIFICATION (LDL):  <100     mg/dL   Optimal  100-129  mg/dL   Near or Above                    Optimal  130-159  mg/dL   Borderline  160-189  mg/dL   High  >190     mg/dL   Very High   ALT 29 12/15/2020   AST 35 12/15/2020   NA 138 12/15/2020   K 4.7 12/15/2020   CL 103 12/15/2020   CREATININE 1.28 (H) 12/15/2020   BUN 25 (H) 12/15/2020   CO2 25 12/15/2020   TSH 2.31 10/14/2020   INR 1.1 (H) 09/30/2019   HGBA1C 6.7 (H) 10/14/2020    Lab Results  Component Value Date   TSH 2.31 10/14/2020   Lab Results  Component Value Date   WBC 6.7 12/15/2020   HGB 10.8 (L) 12/15/2020   HCT 33.3 (L) 12/15/2020   MCV 100.1 (H) 12/15/2020   PLT 177.0 12/15/2020   Lab Results  Component Value Date   NA 138 12/15/2020   K 4.7 12/15/2020   CHLORIDE 104 11/01/2016   CO2 25 12/15/2020   GLUCOSE 158 (H) 12/15/2020   BUN 25 (H) 12/15/2020   CREATININE 1.28 (H) 12/15/2020   BILITOT 0.9 12/15/2020   ALKPHOS 55 12/15/2020   AST 35 12/15/2020   ALT 29 12/15/2020   PROT 6.7 12/15/2020   ALBUMIN 4.1 12/15/2020   CALCIUM 10.2 12/15/2020   ANIONGAP 9 09/15/2020   EGFR 50 (L) 11/01/2016   GFR 40.38 (L) 12/15/2020   Lab Results  Component Value Date   CHOL 177 10/14/2020   Lab Results   Component Value Date   HDL 51.70 10/14/2020   Lab Results  Component Value Date   LDLCALC (  H) 01/31/2007    117        Total Cholesterol/HDL:CHD Risk Coronary Heart Disease Risk Table                     Men   Women  1/2 Average Risk   3.4   3.3  Average Risk       5.0   4.4  2 X Average Risk   9.6   7.1  3 X Average Risk  23.4   11.0        Use the calculated Patient Ratio above and the CHD Risk Table to determine the patient's CHD Risk.        ATP III CLASSIFICATION (LDL):  <100     mg/dL   Optimal  100-129  mg/dL   Near or Above                    Optimal  130-159  mg/dL   Borderline  160-189  mg/dL   High  >190     mg/dL   Very High   Lab Results  Component Value Date   TRIG 303.0 (H) 10/14/2020   Lab Results  Component Value Date   CHOLHDL 3 10/14/2020   Lab Results  Component Value Date   HGBA1C 6.7 (H) 10/14/2020       Assessment & Plan:   Problem List Items Addressed This Visit     Cancer of kidney ([place])    On left, kidney excised. She still notes tenderness and fullness in left flank she sees her oncologist, Dr Alen Blew later this month and they plan to repeat her CT scan      COUGH    She had covid in December but feels largely recovered, had the flu before that her greatest complaints are an occasional mild cough, clear congestion and intermittent sneezing. Encouraged increased hydration and plain Mucinex and report if symptoms worsen      GERD (gastroesophageal reflux disease)    Has some intermittent dysphagia must eat small bites and chew well, Avoid offending foods, start probiotics. Do not eat large meals in late evening and consider raising head of bed. Referred back to GI      Hyperlipidemia    Tolerating statin, encouraged heart healthy diet, avoid trans fats, minimize simple carbs and saturated fats. Increase exercise as tolerated      Relevant Orders   Lipid panel   Low serum vitamin D    Supplement and monitor      Relevant  Orders   VITAMIN D 25 Hydroxy (Vit-D Deficiency, Fractures)   Gout    No recent flares noted. Hydrate and monitor      Relevant Orders   Uric acid   Diarrhea    Continues to have frequent diarrhea and has not had colonoscopy in approaching 5 years. She requests referral back to GI, orders placed. Avoid offending foods and report worsening symptoms      Relevant Orders   Ambulatory referral to Gastroenterology   DM (diabetes mellitus), type 2 ([place])    hgba1c acceptable, minimize simple carbs. Increase exercise as tolerated. Continue current meds      Relevant Orders   Hemoglobin A1c   Renal insufficiency    Hydrate and monitor      Anemia   Relevant Orders   CBC   Primary hypertension    Well controlled, no changes to meds. Encouraged heart healthy diet such as the DASH diet and  exercise as tolerated.       Relevant Orders   Comprehensive metabolic panel   TSH   Right hip pain - Primary   Relevant Orders   Ambulatory referral to Orthopedic Surgery   Elevated lipase    Mild and no new symptoms repeat today      Relevant Orders   Lipase   Ambulatory referral to Gastroenterology   Other Visit Diagnoses     Macrocytic anemia       Relevant Orders   Vitamin B12   Vitamin B1       I am having Ileana Ladd. Pisani maintain her diphenhydramine-acetaminophen, meclizine, aspirin EC, Calcium Carb-Cholecalciferol (CALCIUM 600 + D PO), OneTouch Verio, furosemide, OneTouch Delica Plus [id number], irbesartan, methocarbamol, hyoscyamine, nystatin cream, glimepiride, atorvastatin, metFORMIN, Vitamin D (Ergocalciferol), ondansetron, carvedilol, benzonatate, allopurinol, and albuterol.  No orders of the defined types were placed in this encounter.    Penni Homans, MD

## 2021-03-03 NOTE — Assessment & Plan Note (Signed)
Well controlled, no changes to meds. Encouraged heart healthy diet such as the DASH diet and exercise as tolerated.  °

## 2021-03-03 NOTE — Assessment & Plan Note (Signed)
Has some intermittent dysphagia must eat small bites and chew well, Avoid offending foods, start probiotics. Do not eat large meals in late evening and consider raising head of bed. Referred back to GI

## 2021-03-03 NOTE — Assessment & Plan Note (Signed)
Continues to have frequent diarrhea and has not had colonoscopy in approaching 5 years. She requests referral back to GI, orders placed. Avoid offending foods and report worsening symptoms

## 2021-03-03 NOTE — Assessment & Plan Note (Signed)
She had covid in December but feels largely recovered, had the flu before that her greatest complaints are an occasional mild cough, clear congestion and intermittent sneezing. Encouraged increased hydration and plain Mucinex and report if symptoms worsen

## 2021-03-03 NOTE — Assessment & Plan Note (Signed)
Mild and no new symptoms repeat today

## 2021-03-03 NOTE — Assessment & Plan Note (Signed)
Tolerating statin, encouraged heart healthy diet, avoid trans fats, minimize simple carbs and saturated fats. Increase exercise as tolerated 

## 2021-03-03 NOTE — Assessment & Plan Note (Signed)
hgba1c acceptable, minimize simple carbs. Increase exercise as tolerated. Continue current meds 

## 2021-03-03 NOTE — Patient Instructions (Addendum)
Sublingual vitamin B12 tabs 500 to 1000 mg daily Hydrate 60-80 ounces of fluids a day Take a Mucinex once to twice a day for the mucus  Acute Pancreatitis The pancreas is a gland that is located behind the stomach on the left side of the abdomen. It produces enzymes that help to digest food. The pancreas also releases the hormones glucagon and insulin, which help to regulate blood sugar. Acute pancreatitis happens when inflammation of the pancreas suddenly occurs and the pancreas becomes irritated and swollen. Most acute attacks last a few days and cause serious problems. Some people become dehydrated and develop low blood pressure. In severe cases, bleeding in the abdomen can lead to shock and can be life-threatening. The lungs, heart, and kidneys may fail. What are the causes? This condition may be caused by: Alcohol abuse. Drug abuse. Gallstones or other conditions that can block the tube that drains the pancreas (pancreatic duct). A tumor in the pancreas. Other causes include: Certain medicines. Exposure to certain chemicals. Diabetes. An infection in the pancreas. Damage caused by an accident (trauma). The poison (venom) from a scorpion bite. Abdominal surgery. Autoimmune pancreatitis. This is when the body's disease-fighting (immune) system attacks the pancreas. Genes that are passed from parent to child (inherited). In some cases, the cause of this condition is not known. What are the signs or symptoms? Symptoms of this condition include: Pain in the upper abdomen that may radiate to the back. Pain may be severe. Tenderness and swelling of the abdomen. Nausea and vomiting. Fever. How is this diagnosed? This condition may be diagnosed based on: A physical exam. Blood tests. Imaging tests, such as X-rays, CT or MRI scans, or an ultrasound of the abdomen. How is this treated? Treatment for this condition usually requires a stay in the hospital. Treatment for this condition may  include: Pain medicine. Fluid replacement through an IV. Placing a tube in the stomach to remove stomach contents and to control vomiting (NG tube, or nasogastric tube). Not eating for 3-4 days. This gives the pancreas a rest, because enzymes are not being produced that can cause further damage. Antibiotic medicines, if your condition is caused by an infection. Treating any underlying conditions that may be the cause. Steroid medicines, if your condition is caused by your immune system attacking your body's own tissues (autoimmune disease). Surgery on the pancreas or gallbladder. Follow these instructions at home: Eating and drinking  Follow instructions from your health care provider about diet. This may involve avoiding alcohol and decreasing the amount of fat in your diet. Eat smaller, more frequent meals. This reduces the amount of digestive fluids that the pancreas produces. Drink enough fluid to keep your urine pale yellow. Do not drink alcohol if it caused your condition. General instructions Take over-the-counter and prescription medicines only as told by your health care provider. Do not drive or use heavy machinery while taking prescription pain medicine. Ask your health care provider if the medicine prescribed to you can cause constipation. You may need to take steps to prevent or treat constipation, such as: Take an over-the-counter or prescription medicine for constipation. Eat foods that are high in fiber such as whole grains and beans. Limit foods that are high in fat and processed sugars, such as fried or sweet foods. Do not use any products that contain nicotine or tobacco, such as cigarettes, e-cigarettes, and chewing tobacco. If you need help quitting, ask your health care provider. Get plenty of rest. If directed, check your blood  sugar at home as told by your health care provider. Keep all follow-up visits as told by your health care provider. This is  important. Contact a health care provider if you: Do not recover as quickly as expected. Develop new or worsening symptoms. Have persistent pain, weakness, or nausea. Recover and then have another episode of pain. Have a fever. Get help right away if: You cannot eat or keep fluids down. Your pain becomes severe. Your skin or the white part of your eyes turns yellow (jaundice). You have sudden swelling in your abdomen. You vomit. You feel dizzy or you faint. Your blood sugar is high (over 300 mg/dL). Summary Acute pancreatitis happens when inflammation of the pancreas suddenly occurs and the pancreas becomes irritated and swollen. This condition is typically caused by alcohol abuse, drug abuse, or gallstones. Treatment for this condition usually requires a stay in the hospital. This information is not intended to replace advice given to you by your health care provider. Make sure you discuss any questions you have with your health care provider. Document Revised: 11/05/2017 Document Reviewed: 07/23/2017 Elsevier Patient Education  [employer].    Anemia Anemia is a condition in which there is not enough red blood cells or hemoglobin in the blood. Hemoglobin is a substance in red blood cells that carries oxygen. When you do not have enough red blood cells or hemoglobin (are anemic), your body cannot get enough oxygen and your organs may not work properly. As a result, you may feel very tired or have other problems. What are the causes? Common causes of anemia include: Excessive bleeding. Anemia can be caused by excessive bleeding inside or outside the body, including bleeding from the intestines or from heavy menstrual periods in females. Poor nutrition. Long-lasting (chronic) kidney, thyroid, and liver disease. Bone marrow disorders, spleen problems, and blood disorders. Cancer and treatments for cancer. HIV (human immunodeficiency virus) and AIDS (acquired immunodeficiency  syndrome). Infections, medicines, and autoimmune disorders that destroy red blood cells. What are the signs or symptoms? Symptoms of this condition include: Minor weakness. Dizziness. Headache, or difficulties concentrating and sleeping. Heartbeats that feel irregular or faster than normal (palpitations). Shortness of breath, especially with exercise. Pale skin, lips, and nails, or cold hands and feet. Indigestion and nausea. Symptoms may occur suddenly or develop slowly. If your anemia is mild, you may not have symptoms. How is this diagnosed? This condition is diagnosed based on blood tests, your medical history, and a physical exam. In some cases, a test may be needed in which cells are removed from the soft tissue inside of a bone and looked at under a microscope (bone marrow biopsy). Your health care provider may also check your stool (feces) for blood and may do additional testing to look for the cause of your bleeding. Other tests may include: Imaging tests, such as a CT scan or MRI. A procedure to see inside your esophagus and stomach (endoscopy). A procedure to see inside your colon and rectum (colonoscopy). How is this treated? Treatment for this condition depends on the cause. If you continue to lose a lot of blood, you may need to be treated at a hospital. Treatment may include: Taking supplements of iron, vitamin Z66, or folic acid. Taking a hormone medicine (erythropoietin) that can help to stimulate red blood cell growth. Having a blood transfusion. This may be needed if you lose a lot of blood. Making changes to your diet. Having surgery to remove your spleen. Follow these instructions  at home: Take over-the-counter and prescription medicines only as told by your health care provider. Take supplements only as told by your health care provider. Follow any diet instructions that you were given by your health care provider. Keep all follow-up visits as told by your health  care provider. This is important. Contact a health care provider if: You develop new bleeding anywhere in the body. Get help right away if: You are very weak. You are short of breath. You have pain in your abdomen or chest. You are dizzy or feel faint. You have trouble concentrating. You have bloody stools, black stools, or tarry stools. You vomit repeatedly or you vomit up blood. These symptoms may represent a serious problem that is an emergency. Do not wait to see if the symptoms will go away. Get medical help right away. Call your local emergency services (911 in the [place] Do not drive yourself to the hospital. Summary Anemia is a condition in which you do not have enough red blood cells or enough of a substance in your red blood cells that carries oxygen (hemoglobin). Symptoms may occur suddenly or develop slowly. If your anemia is mild, you may not have symptoms. This condition is diagnosed with blood tests, a medical history, and a physical exam. Other tests may be needed. Treatment for this condition depends on the cause of the anemia. This information is not intended to replace advice given to you by your health care provider. Make sure you discuss any questions you have with your health care provider. Document Revised: 12/24/2018 Document Reviewed: 12/24/2018 Elsevier Patient Education  2022 [employer].

## 2021-03-03 NOTE — Assessment & Plan Note (Signed)
No recent flares noted. Hydrate and monitor

## 2021-03-03 NOTE — Assessment & Plan Note (Signed)
On left, kidney excised. She still notes tenderness and fullness in left flank she sees her oncologist, Dr Alen Blew later this month and they plan to repeat her CT scan

## 2021-03-04 ENCOUNTER — Telehealth: Payer: Self-pay | Admitting: Family Medicine

## 2021-03-04 NOTE — Telephone Encounter (Signed)
Pt aware of results 

## 2021-03-04 NOTE — Telephone Encounter (Signed)
Pt called to go over lab results. Please advise.

## 2021-03-10 ENCOUNTER — Telehealth: Payer: Self-pay | Admitting: Oncology

## 2021-03-10 LAB — VITAMIN B1: Vitamin B1 (Thiamine): 8 nmol/L (ref 8–30)

## 2021-03-10 NOTE — Telephone Encounter (Signed)
Called patient regarding upcoming appointments, left a voicemail. 

## 2021-03-15 ENCOUNTER — Telehealth: Payer: Self-pay | Admitting: Family Medicine

## 2021-03-16 ENCOUNTER — Other Ambulatory Visit: Payer: Self-pay

## 2021-03-16 ENCOUNTER — Ambulatory Visit (INDEPENDENT_AMBULATORY_CARE_PROVIDER_SITE_OTHER): Payer: No Typology Code available for payment source

## 2021-03-16 ENCOUNTER — Ambulatory Visit (INDEPENDENT_AMBULATORY_CARE_PROVIDER_SITE_OTHER): Payer: No Typology Code available for payment source | Admitting: Orthopaedic Surgery

## 2021-03-16 DIAGNOSIS — M79604 Pain in right leg: Secondary | ICD-10-CM

## 2021-03-16 DIAGNOSIS — G8929 Other chronic pain: Secondary | ICD-10-CM

## 2021-03-16 DIAGNOSIS — M5441 Lumbago with sciatica, right side: Secondary | ICD-10-CM | POA: Diagnosis not present

## 2021-03-16 NOTE — Progress Notes (Signed)
Office Visit Note   Patient: Amanda Crawford           Date of Birth: 1945-04-18           MRN: [id number] Visit Date: 03/16/2021              Requested by: [name], MD [address] [place],  [place] [address] PCP: [name], MD   Assessment & Plan: Visit Diagnoses:  1. Pain in right leg   2. Chronic right-sided low back pain with right-sided sciatica     Plan: I went over her x-rays with her and her clinical exam findings.  I think it is appropriate we send her to outpatient physical therapy at [facility] which is near her for any modalities that can help decrease her low back pain to the right side and improve her mobility and function overall.  She agrees with this treatment plan.  I gave her the prescription for outpatient therapy and we can see her back in 6 weeks to see how she is doing overall.  All questions and concerns were answered and addressed.  If this conservative treatment fails, we would recommend a MRI of her lumbar spine.  Follow-Up Instructions: Return in about 6 weeks (around 04/27/2021).   Orders:  Orders Placed This Encounter  Procedures   XR Lumbar Spine 2-3 Views   No orders of the defined types were placed in this encounter.     Procedures: No procedures performed   Clinical Data: No additional findings.   Subjective: Chief Complaint  Patient presents with   Right Hip - Pain  The patient is a very pleasant 76 year old female who comes in for evaluation treatment of right-sided low back and hip pain and buttocks pain.  She says is been going on for several months.  Some days are worse than others and sometimes she can barely get out of bed other times she is fine.  She is chronically short of breath from having a wedge resection of her left lung.  She is also had a kidney resection due to renal cancer.  She is a diabetic but has good blood glucose control.  She cannot take anti-inflammatories.  She likes to defer any type  of pain medications.  She has not had any type of therapy.  She denies any radicular symptoms going down her legs or weakness in her legs.  There is been no change in bowel bladder function.  HPI  Review of Systems She does report chronic shortness of breath.  She denies any chest pain, fever, chills, nausea, vomiting  Objective: Vital Signs: There were no vitals taken for this visit.  Physical Exam She is alert and oriented today and in no acute distress Ortho Exam Examination of both hips show they are normal in terms of range of motion and no pain over the trochanteric area or in the groin with palpation or range of motion.  She has good strength in her bilateral lower extremities as well.  When I have her lay in a supine position she has negative straight leg raise.  Again she is neurovascular intact in both legs with good muscle tone.  When I have her lay with her right hip up in a decubitus position her pain seems to be in the paraspinal muscles on the right side and does radiate into the pelvis and sciatic region. Specialty Comments:  No specialty comments available.  Imaging: XR Lumbar Spine  2-3 Views  Result Date: 03/16/2021 2 views of the lumbar spine show degenerative changes at multiple levels.    PMFS History: Patient Active Problem List   Diagnosis Date Noted   Right hip pain 03/03/2021   Elevated lipase 03/03/2021   Acute pancreatitis 12/15/2020   Anemia 12/15/2020   Primary hypertension 12/15/2020   Hyperkalemia 10/14/2020   Urinary incontinence 10/14/2020   Frequent urination 09/09/2020   Chronic midline low back pain without sciatica 09/09/2020   Diverticulitis 01/06/2019   Renal insufficiency 09/22/2018   Right renal mass 08/21/2018   Malignant tumor of renal pelvis, right ([place]) 08/21/2018   Coronary artery disease involving native coronary artery of native heart without angina pectoris 47/09/6281   Chronic systolic heart failure ([place]) 07/30/2017    Abdominal pain 06/07/2017   Skin tags, multiple acquired 02/08/2017   UTI (urinary tract infection) 10/29/2016   Nonischemic cardiomyopathy ([place]) 07/24/2016   LBBB (left bundle branch block) 07/24/2016   Diarrhea 06/29/2016   DM (diabetes mellitus), type 2 ([place]) 06/29/2016   Bilateral thoracic back pain 09/12/2015   Preventative health care 09/12/2015   Allergic state 03/12/2015   Dermatitis 03/12/2015   Skin lesion of breast 03/12/2015   Gout 03/12/2015   Morbid obesity ([place]) 01/20/2015   Migraine 11/23/2014   DJD (degenerative joint disease) 11/23/2014   Benign paroxysmal positional vertigo 11/23/2014   H/O measles    Hx of colonic polyp 11/19/2014   Hx of cancer of lung 11/19/2014   GERD (gastroesophageal reflux disease) 11/19/2014   Hyperlipidemia 11/19/2014   Osteopenia 11/19/2014   Low serum vitamin D 11/19/2014   COUGH 09/16/2007   Cancer of kidney ([place]) 08/14/2007   WEIGHT GAIN, ABNORMAL 08/14/2007   Dyspnea 08/14/2007   Past Medical History:  Diagnosis Date   Allergic state 03/12/2015   Arthritis    Colon polyp 11/19/2014   Dermatitis 03/12/2015   Diarrhea 06/29/2016   Diverticulitis    DM (diabetes mellitus), type 2 ([place]) 06/29/2016   GERD (gastroesophageal reflux disease)    occ   Gout 03/12/2015   H/O measles    H/O mumps    Headache(784.0)    migraines   History of chicken pox 11/23/2014   Hyperglycemia 06/29/2016   Lung cancer ([place])    Migraine 11/23/2014   Obesity 11/23/2014   Pneumonia    child   Preventative health care 09/12/2015   Primary hypertension 12/15/2020   Renal cell cancer ([place])    renal cell ca dx 9/01 and 8/08;   Renal insufficiency    Shortness of breath    occ   Skin lesion of breast 03/12/2015    Family History  Problem Relation Age of Onset   Dementia Mother    Heart failure Father    Diabetes Father    Hyperlipidemia Father    Heart disease Father    Hypertension Father    Asthma Brother    Asthma Daughter    Alcohol  abuse Maternal Aunt    Colon cancer Paternal Aunt    Cancer Paternal Grandmother    Hearing loss Paternal Grandfather    Stroke Paternal Grandfather     Past Surgical History:  Procedure Laterality Date   ABDOMINAL HYSTERECTOMY     APPENDECTOMY     CARDIAC CATHETERIZATION     yrs ago neg   CHOLECYSTECTOMY     COLONOSCOPY  2018   ESOPHAGOGASTRODUODENOSCOPY     years ago   IR RADIOLOGIST EVAL & MGMT  07/17/2018  IR RADIOLOGIST EVAL & MGMT  09/12/2018   IR RADIOLOGIST EVAL & MGMT  06/18/2019   IR RADIOLOGIST EVAL & MGMT  01/13/2020   IR RADIOLOGIST EVAL & MGMT  07/08/2020   IR RADIOLOGIST EVAL & MGMT  10/12/2020   KIDNEY SURGERY  2001   Removed of left kidney    LUNG CANCER SURGERY Left 08   RADIOLOGY WITH ANESTHESIA N/A 08/21/2018   Procedure: CT WITH ANESTHESIA RENAL CRYOABLATION;  Surgeon: Arne Cleveland, MD;  Location: [facility];  Service: Radiology;  Laterality: N/A;   RENAL MASS EXCISION Left 01   RIGHT/LEFT HEART CATH AND CORONARY ANGIOGRAPHY N/A 07/27/2016   Procedure: Right/Left Heart Cath and Coronary Angiography;  Surgeon: Leonie Man, MD;  Location: [facility];  Service: Cardiovascular;  Laterality: N/A;   THORACOTOMY Left 05/09/2012   Procedure: THORACOTOMY MAJOR;  Surgeon: Gaye Pollack, MD;  Location: [facility];  Service: Thoracic;  Laterality: Left;   VIDEO BRONCHOSCOPY N/A 05/09/2012   Procedure: VIDEO BRONCHOSCOPY;  Surgeon: Gaye Pollack, MD;  Location: [facility];  Service: Thoracic;  Laterality: N/A;   WEDGE RESECTION Left 05/09/2012   Procedure: LEFT UPPER LOBE WEDGE RESECTION;  Surgeon: Gaye Pollack, MD;  Location: [facility];  Service: Thoracic;  Laterality: Left;   Social History   Occupational History   Occupation: retired  Tobacco Use   Smoking status: Former    Packs/day: 0.50    Years: 35.00    Pack years: 17.50    Types: Cigarettes    Quit date: 05/08/2006    Years since quitting: 14.8   Smokeless tobacco: Never  Vaping Use   Vaping Use: Never used   Substance and Sexual Activity   Alcohol use: Yes    Comment: seldomly   Drug use: No   Sexual activity: Not on file    Comment: lives with husband, no dietary restrictions.

## 2021-03-18 ENCOUNTER — Other Ambulatory Visit: Payer: Self-pay | Admitting: Internal Medicine

## 2021-03-22 ENCOUNTER — Ambulatory Visit (INDEPENDENT_AMBULATORY_CARE_PROVIDER_SITE_OTHER): Payer: No Typology Code available for payment source | Admitting: Gastroenterology

## 2021-03-22 ENCOUNTER — Encounter: Payer: Self-pay | Admitting: Gastroenterology

## 2021-03-22 ENCOUNTER — Other Ambulatory Visit: Payer: Self-pay

## 2021-03-22 ENCOUNTER — Other Ambulatory Visit (INDEPENDENT_AMBULATORY_CARE_PROVIDER_SITE_OTHER): Payer: No Typology Code available for payment source

## 2021-03-22 VITALS — BP 102/60 | HR 76 | Ht 64.0 in | Wt 216.0 lb

## 2021-03-22 DIAGNOSIS — K7581 Nonalcoholic steatohepatitis (NASH): Secondary | ICD-10-CM | POA: Diagnosis not present

## 2021-03-22 DIAGNOSIS — R159 Full incontinence of feces: Secondary | ICD-10-CM

## 2021-03-22 DIAGNOSIS — R195 Other fecal abnormalities: Secondary | ICD-10-CM

## 2021-03-22 DIAGNOSIS — N3946 Mixed incontinence: Secondary | ICD-10-CM | POA: Diagnosis not present

## 2021-03-22 DIAGNOSIS — R197 Diarrhea, unspecified: Secondary | ICD-10-CM

## 2021-03-22 DIAGNOSIS — R748 Abnormal levels of other serum enzymes: Secondary | ICD-10-CM

## 2021-03-22 DIAGNOSIS — Z86010 Personal history of colonic polyps: Secondary | ICD-10-CM | POA: Diagnosis not present

## 2021-03-22 LAB — PROTIME-INR
INR: 1.1 ratio — ABNORMAL HIGH (ref 0.8–1.0)
Prothrombin Time: 12.3 s (ref 9.6–13.1)

## 2021-03-22 NOTE — Progress Notes (Signed)
Chief Complaint:    Abdominal pain, fecal incontinence  GI History: Amanda Crawford is a 76 y.o. female with a history of renal cell cancer, lung cancer, DM, GERD, obesity (BMI 37.5), hyperlipidemia, gout, systolic heart failure, previous hysterectomy, appendectomy, cholecystectomy, lung cancer surgery (left thoracotomy with wedge resection), left nephrectomy 2001, cryoablation of right renal mass 07/2018 initially seen in the GI clinic on 09/11/2019 for evaluation of episodic diarrhea and abdominal pain.  1) Fecal incontinence: History of episodic explosive diarrhea approximately once/week with associated LLQ pain, but symptoms worsening at that time.  Symptoms occur at random with clear exacerbating factors.  Symptoms have been present on/off for many years.  Has had episodes of urge incontinence without fecal seepage.  Previously prescribed hyoscyamine prn which helps with pain episodes. -08/2019: Normal pancreatic fecal elastase, fecal calprotectin. GI PCR panel with C. difficile toxin positive; treated with fidaxomicin and probiotics with clinical improvement - 12/2019: Follow-up in the GI clinic.  Feeling much better with very rare episodes of fecal urgency with incontinence.  No fecal seepage in between episodes.  Using Benefiber daily with good p.o. intake.  Plan for ARM +/- colonoscopy with biopsies if symptoms recur/worsen - 11/2020: Negative/normal GI PCR panel  2) NASH: Mildly elevated liver enzymes in 07/2019 (AST/ALT 47/41, stable from previous).  Further work-up as below: -11/20/2018: CT abdomen/pelvis: Nodular hepatic contours, mild hepatic steatosis.  Normal pancreas.  Reactive lymph nodes near the porta hepatis. -06/05/2019: CT abdomen/pelvis: Cirrhotic appearing liver, 6 mm right hepatic lobe lesion similar to previous exam, no duct dilatation, fatty replacement of the pancreas. Diverticulosis otherwise normal GI tract.  Moderate pelvic floor laxity -APRI score 0.72 which indicates  significant fibrosis or possibly cirrhosis - 08/2019: Negative/normal extended serologic work-up for concomitant liver disease.  Normal INR -RUQ [place] (08/2019): Cirrhotic appearing liver without liver lesions.  Patent PV with appropriate flow.  S/p ccy -Ultrasound elastography (08/2019): Nodular contour on ultrasound but normal elastography portion without increased liver stiffness -FibroSure panel: Highly suggestive of NASH F3 -CT C/A/P (12/2019; malignancy surveillance): Hepatic cirrhosis without HCC, diverticulosis, stable reactive porta hepatis and peripancreatic lymph nodes. 3 mm RLL nodule - 03/2021: AST/ALT 45/37, T. bili 0.8, ALP 61, albumin 4.3, creatinine 1.25, PLT 176.  Normal CBC, vitamin D, B12.  Lipase 114 -CTAP (05/2020): Cirrhotic liver morphology, ccy without duct dilation.  Normal pancreas.  Diverticulosis, otherwise normal GI tract - Abdominal US (01/2021): Steatosis with nodular liver contours.  Patent PV.  Normal visualized portions of pancreas.        Endoscopic History: -Colonoscopy (09/2016, [facility]): 1 polyp in transverse colon, mild sigmoid diverticulosis.  Repeat in 5 years -Colonoscopy (08/2013, [facility]): 5 polyps (tubular adenomas, SSP's, hyperplastic polyp), sigmoid diverticulosis, cecal AVM.  Repeat 3 years   HPI:     Patient is a 76 y.o. female presenting to the Gastroenterology Clinic for evaluation of loose stools and fecal incontinence recurrence. Last seen by me in 12/2019, and at that time was doing much better.   Over last several months has had recurrence of fecal urgency and a couple episodes of incontinence. +loose, non-bloody stools. Sxs occur every 7-10 days or so. No fecal seepage in between episodes. In between episodes will generally have formed stools, but can also have days with episodes of loose stools or increased frequency. Now worried to go out in public. Sxs can occur at home as well though. No related food types, etc. No nocturnal  sxs.   Can also have  urinary incontinence. Doe snot think she has seen Urology for that issue in the past.   Separately, she would like to discuss elevated lipase on recent labs (ULN 59).  No associated epigastric pain, nausea/vomiting. - 12/08/2020: 106 - 12/15/2020: 107 - 03/03/2021: 114  Review of systems:     No chest pain, no SOB, no fevers, no urinary sx   Past Medical History:  Diagnosis Date   Allergic state 03/12/2015   Arthritis    Colon polyp 11/19/2014   Dermatitis 03/12/2015   Diarrhea 06/29/2016   Diverticulitis    DM (diabetes mellitus), type 2 ([place]) 06/29/2016   GERD (gastroesophageal reflux disease)    occ   Gout 03/12/2015   H/O measles    H/O mumps    Headache(784.0)    migraines   History of chicken pox 11/23/2014   Hyperglycemia 06/29/2016   Lung cancer ([place])    Migraine 11/23/2014   Obesity 11/23/2014   Pneumonia    child   Preventative health care 09/12/2015   Primary hypertension 12/15/2020   Renal cell cancer ([place])    renal cell ca dx 9/01 and 8/08;   Renal insufficiency    Shortness of breath    occ   Skin lesion of breast 03/12/2015    Patient's surgical history, family medical history, social history, medications and allergies were all reviewed in Epic    Current Outpatient Medications  Medication Sig Dispense Refill   albuterol (VENTOLIN HFA) 108 (90 Base) MCG/ACT inhaler PLEASE SEE ATTACHED FOR DETAILED DIRECTIONS     allopurinol (ZYLOPRIM) 300 MG tablet TAKE 1 TABLET BY MOUTH EVERY DAY 90 tablet 1   aspirin EC 81 MG tablet Take 81 mg by mouth daily.     atorvastatin (LIPITOR) 20 MG tablet TAKE 1 TABLET BY MOUTH EVERY DAY 90 tablet 1   Calcium Carb-Cholecalciferol (CALCIUM 600 + D PO) Take 1 tablet by mouth daily.     carvedilol (COREG) 6.25 MG tablet Take 0.5 tablets (3.125 mg total) by mouth 2 (two) times daily with a meal. Pt needs an OV for future refills 180 tablet 1   diphenhydramine-acetaminophen (TYLENOL PM) 25-500 MG TABS Take 1  tablet by mouth at bedtime as needed (sleep).      furosemide (LASIX) 40 MG tablet TAKE 1 TABLET BY MOUTH EVERY DAY 90 tablet 3   glimepiride (AMARYL) 2 MG tablet TAKE 1 TABLET BY MOUTH EVERY DAY WITH BREAKFAST 90 tablet 1   hyoscyamine (LEVSIN SL) 0.125 MG SL tablet Place 1 tablet (0.125 mg total) under the tongue every 6 (six) hours as needed. 30 tablet 1   irbesartan (AVAPRO) 75 MG tablet TAKE 1 TABLET BY MOUTH EVERY DAY 90 tablet 1   Lancets (ONETOUCH DELICA PLUS [id number]) MISC USE AS DIRECTED TO TEST DAILY 100 each 12   meclizine (ANTIVERT) 12.5 MG tablet Take 1 tablet (12.5 mg total) by mouth 3 (three) times daily as needed for dizziness. As needed for   Dizziness or nausea 30 tablet 1   metFORMIN (GLUCOPHAGE) 500 MG tablet TAKE 1 TABLET BY MOUTH 2 TIMES DAILY WITH A MEAL. 180 tablet 1   methocarbamol (ROBAXIN) 500 MG tablet Take 1 tablet (500 mg total) by mouth 4 (four) times daily. 45 tablet 1   nystatin cream (MYCOSTATIN) Apply 1 application topically 2 (two) times daily. 30 g 1   ondansetron (ZOFRAN-ODT) 4 MG disintegrating tablet Take by mouth.     ONETOUCH VERIO test strip CEHCK BLOOD SUGAR DAILY AS NEEDED  100 strip 12   Vitamin D, Ergocalciferol, (DRISDOL) 1.25 MG (50000 UNIT) CAPS capsule TAKE 1 CAPSULE (50,000 UNITS TOTAL) BY MOUTH EVERY 7 (SEVEN) DAYS 12 capsule 1   No current facility-administered medications for this visit.    Physical Exam:     BP 102/60    Pulse 76    Ht 5\' 4"  (1.626 m)    Wt 216 lb (98 kg)    SpO2 97%    BMI 37.08 kg/m   GENERAL:  Pleasant female in NAD PSYCH: : Cooperative, normal affect NEURO: Alert and oriented x 3, no focal neurologic deficits   IMPRESSION and PLAN:    1) Fecal incontinence 2) Diarrhea 3) Change in bowel habits Seemingly the most problematic symptom for her is the episodic urge fecal incontinence.  Does not have episodes of fecal seepage in between.  Plan to evaluate as follows:  - Anorectal Manometry - Recheck GI PCR  panel, fecal calprotectin, and pancreatic elastase - She is due for repeat colonoscopy for polyp surveillance.  Prefers to wait to schedule until after ARM and stool studies are complete which is reasonable.  If unremarkable work-up as above, plan for colonic biopsies at that time as well  4) Elevated lipase - Mildly elevated lipase in November and again earlier this month.  All <2x ULN.  No associated epigastric pain, nausea/vomiting.  Possibly related to fatty replacement noted on CT in 2021.  Otherwise no PD dilation or mass on previous imaging - Is already scheduled for CT C/A/P ordered for later this week as part of cancer surveillance.  Evaluate for intra-abdominal pathology at that time. - Due to elevated patient concerns and persistently elevated lipase, further evaluation with MRCP  5) Urinary incontinence - Referral to Urology for stress and urge incontinence  6) NASH Known history of NASH.  While she does have some radiographic features of cirrhosis (nodular liver contour), she has otherwise had preserved hepatic synthetic function and no e/o portal hypertension to date.  Otherwise, she does remain at elevated risk for progression to cirrhosis given underlying comorbidities.   -Check INR.  Otherwise preserved hepatic synthetic function on most recent labs  7) History of colon polyps - Due for ongoing polyp surveillance colonoscopy - Plan to schedule colonoscopy after completion of studies as outlined above  I spent 35 minutes of time, including in depth chart review, independent review of results as outlined above, communicating results with the patient directly, face-to-face time with the patient, coordinating care, and ordering studies and medications as appropriate, and documentation.       Lavena Bullion ,DO, FACG 03/22/2021, 10:33 AM

## 2021-03-22 NOTE — Patient Instructions (Signed)
If you are age 78 or older, your body mass index should be between 23-30. Your Body mass index is 37.08 kg/m. If this is out of the aforementioned range listed, please consider follow up with your Primary Care Provider.  If you are age 49 or younger, your body mass index should be between 19-25. Your Body mass index is 37.08 kg/m. If this is out of the aformentioned range listed, please consider follow up with your Primary Care Provider.   ________________________________________________________  The [place] [facility] providers would like to encourage you to use [facility] to communicate with providers for non-urgent requests or questions.  Due to long hold times on the telephone, sending your provider a message by [facility] may be a faster and more efficient way to get a response.  Please allow 48 business hours for a response.  Please remember that this is for non-urgent requests.  _______________________________________________________  Please go to the lab on the 2nd floor [address] before you leave the office today.   You have been scheduled for an MRI at  [facility] 04-05-2021  at [place]. Your appointment arrival time is 830am. Please arrive to admitting (at main entrance of the hospital) 30 minutes prior to your appointment time for registration purposes. Please make certain not to have anything to eat or drink 4 hours prior to your test. In addition, if you have any metal in your body, have a pacemaker or defibrillator, please be sure to let your ordering physician know. This test typically takes 45 minutes to 1 hour to complete. Should you need to reschedule, please call [phone] to do so.  You have been scheduled to have an anorectal manometry at [facility] on May 31 at 12:30pm. Please arrive 30 minutes prior to your appointment time for registration (1st floor of the hospital-admissions).  Please make certain to use 1 Fleets enema 2 hours prior to coming for your appointment. You  can purchase Fleets enemas from the laxative section at your drug store. You should not eat anything during the two hours prior to the procedure. You may take regular medications with small sips of water at least 2 hours prior to the study.  Anorectal manometry is a test performed to evaluate patients with constipation or fecal incontinence. This test measures the pressures of the anal sphincter muscles, the sensation in the rectum, and the neural reflexes that are needed for normal bowel movements.  THE PROCEDURE The test takes approximately 30 minutes to 1 hour. You will be asked to change into a hospital gown. A technician or nurse will explain the procedure to you, take a brief health history, and answer any questions you may have. The patient then lies on his or her left side. A small, flexible tube, about the size of a thermometer, with a balloon at the end is inserted into the rectum. The catheter is connected to a machine that measures the pressure. During the test, the small balloon attached to the catheter may be inflated in the rectum to assess the normal reflex pathways. The nurse or technician may also ask the person to squeeze, relax, and push at various times. The anal sphincter muscle pressures are measured during each of these maneuvers. To squeeze, the patient tightens the sphincter muscles as if trying to prevent anything from coming out. To push or bear down, the patient strains down as if trying to have a bowel movement.   Please call with any questions or concerns.  It was  a pleasure to see you today!  Vito Cirigliano, D.O.

## 2021-03-22 NOTE — Telephone Encounter (Signed)
Opened in error

## 2021-03-24 ENCOUNTER — Other Ambulatory Visit: Payer: Self-pay

## 2021-03-24 ENCOUNTER — Ambulatory Visit (HOSPITAL_COMMUNITY)
Admission: RE | Admit: 2021-03-24 | Discharge: 2021-03-24 | Disposition: A | Discharge status: Home / Self Care | Payer: No Typology Code available for payment source | Source: Ambulatory Visit | Attending: Oncology | Admitting: Oncology

## 2021-03-24 ENCOUNTER — Inpatient Hospital Stay: Payer: No Typology Code available for payment source | Attending: Oncology

## 2021-03-24 DIAGNOSIS — Z905 Acquired absence of kidney: Secondary | ICD-10-CM | POA: Insufficient documentation

## 2021-03-24 DIAGNOSIS — I700 Atherosclerosis of aorta: Secondary | ICD-10-CM | POA: Insufficient documentation

## 2021-03-24 DIAGNOSIS — R918 Other nonspecific abnormal finding of lung field: Secondary | ICD-10-CM | POA: Insufficient documentation

## 2021-03-24 DIAGNOSIS — D4989 Neoplasm of unspecified behavior of other specified sites: Secondary | ICD-10-CM | POA: Diagnosis not present

## 2021-03-24 DIAGNOSIS — R911 Solitary pulmonary nodule: Secondary | ICD-10-CM | POA: Diagnosis not present

## 2021-03-24 DIAGNOSIS — K5730 Diverticulosis of large intestine without perforation or abscess without bleeding: Secondary | ICD-10-CM | POA: Insufficient documentation

## 2021-03-24 DIAGNOSIS — C649 Malignant neoplasm of unspecified kidney, except renal pelvis: Secondary | ICD-10-CM | POA: Diagnosis not present

## 2021-03-24 LAB — CBC WITH DIFFERENTIAL (CANCER CENTER ONLY)
Abs Immature Granulocytes: 0.04 10*3/uL (ref 0.00–0.07)
Basophils Absolute: 0.1 10*3/uL (ref 0.0–0.1)
Basophils Relative: 1 %
Eosinophils Absolute: 0.4 10*3/uL (ref 0.0–0.5)
Eosinophils Relative: 5 %
HCT: 37.8 % (ref 36.0–46.0)
Hemoglobin: 12.2 g/dL (ref 12.0–15.0)
Immature Granulocytes: 1 %
Lymphocytes Relative: 23 %
Lymphs Abs: 1.9 10*3/uL (ref 0.7–4.0)
MCH: 32 pg (ref 26.0–34.0)
MCHC: 32.3 g/dL (ref 30.0–36.0)
MCV: 99.2 fL (ref 80.0–100.0)
Monocytes Absolute: 0.6 10*3/uL (ref 0.1–1.0)
Monocytes Relative: 7 %
Neutro Abs: 5.5 10*3/uL (ref 1.7–7.7)
Neutrophils Relative %: 63 %
Platelet Count: 187 10*3/uL (ref 150–400)
RBC: 3.81 MIL/uL — ABNORMAL LOW (ref 3.87–5.11)
RDW: 15 % (ref 11.5–15.5)
WBC Count: 8.5 10*3/uL (ref 4.0–10.5)
nRBC: 0 % (ref 0.0–0.2)

## 2021-03-24 LAB — CMP (CANCER CENTER ONLY)
ALT: 24 U/L (ref 0–44)
AST: 34 U/L (ref 15–41)
Albumin: 4.1 g/dL (ref 3.5–5.0)
Alkaline Phosphatase: 58 U/L (ref 38–126)
Anion gap: 6 (ref 5–15)
BUN: 39 mg/dL — ABNORMAL HIGH (ref 8–23)
CO2: 27 mmol/L (ref 22–32)
Calcium: 11.3 mg/dL — ABNORMAL HIGH (ref 8.9–10.3)
Chloride: 103 mmol/L (ref 98–111)
Creatinine: 1.48 mg/dL — ABNORMAL HIGH (ref 0.44–1.00)
GFR, Estimated: 36 mL/min — ABNORMAL LOW (ref >60)
Glucose, Bld: 121 mg/dL — ABNORMAL HIGH (ref 70–99)
Potassium: 4.9 mmol/L (ref 3.5–5.1)
Sodium: 136 mmol/L (ref 135–145)
Total Bilirubin: 0.9 mg/dL (ref 0.3–1.2)
Total Protein: 7.4 g/dL (ref 6.5–8.1)

## 2021-03-30 ENCOUNTER — Telehealth: Payer: Self-pay | Admitting: Family Medicine

## 2021-03-30 NOTE — Telephone Encounter (Signed)
[facility] stated they faxed over request on 2/20 for cpt code for procedure. Please advise. ?

## 2021-03-31 ENCOUNTER — Other Ambulatory Visit: Payer: Self-pay

## 2021-03-31 ENCOUNTER — Inpatient Hospital Stay: Payer: No Typology Code available for payment source | Attending: Oncology | Admitting: Oncology

## 2021-03-31 VITALS — BP 95/51 | HR 86 | Temp 97.2°F | Resp 18 | Wt 214.2 lb

## 2021-03-31 DIAGNOSIS — R0609 Other forms of dyspnea: Secondary | ICD-10-CM | POA: Insufficient documentation

## 2021-03-31 DIAGNOSIS — Z79899 Other long term (current) drug therapy: Secondary | ICD-10-CM | POA: Insufficient documentation

## 2021-03-31 DIAGNOSIS — R918 Other nonspecific abnormal finding of lung field: Secondary | ICD-10-CM | POA: Diagnosis not present

## 2021-03-31 DIAGNOSIS — K5730 Diverticulosis of large intestine without perforation or abscess without bleeding: Secondary | ICD-10-CM | POA: Insufficient documentation

## 2021-03-31 DIAGNOSIS — R748 Abnormal levels of other serum enzymes: Secondary | ICD-10-CM | POA: Diagnosis not present

## 2021-03-31 DIAGNOSIS — Z905 Acquired absence of kidney: Secondary | ICD-10-CM | POA: Diagnosis not present

## 2021-03-31 DIAGNOSIS — C649 Malignant neoplasm of unspecified kidney, except renal pelvis: Secondary | ICD-10-CM

## 2021-03-31 DIAGNOSIS — R159 Full incontinence of feces: Secondary | ICD-10-CM | POA: Insufficient documentation

## 2021-03-31 DIAGNOSIS — I700 Atherosclerosis of aorta: Secondary | ICD-10-CM | POA: Diagnosis not present

## 2021-03-31 DIAGNOSIS — Z85528 Personal history of other malignant neoplasm of kidney: Secondary | ICD-10-CM | POA: Insufficient documentation

## 2021-03-31 DIAGNOSIS — D4989 Neoplasm of unspecified behavior of other specified sites: Secondary | ICD-10-CM

## 2021-03-31 NOTE — Telephone Encounter (Signed)
[facility] in regards to CPT code needed. Representative stated that there were no current procedures that needed a CPT code. Representative stated to disregard fax.  ?

## 2021-03-31 NOTE — Progress Notes (Signed)
Hematology and Oncology Follow Up  ? ?GWENDOLEN HEWLETT ?[id number] ?Dec 14, 1945 76 y.o. ?03/31/2021 9:55 AM ?[name], MDBlyth, [name], MD  ? ? ? ? ? ?Principle Diagnosis:  76 year old woman with stage IV clear-cell renal cell carcinoma without any evidence of active disease.  She initially presented with localized disease in 2001 and subsequently developed with isolated pulmonary metastasis that was surgically resected. ?  ?  ?Secondary diagnosis: Right renal neoplasm diagnosed in July 2020.   ?  ?Prior Therapy:  ?She is status post left nephrectomy in 2001.  ? ?She developed a left lingular mass, status post VATS procedure.  Pathology revealed a recurrent renal cell carcinoma resected in October 2008.   ? ?She developed relapse disease in 2014 and subsequently underwent a VATS and left upper lobe wedge resection of a tumor done on 05/09/2012. Pathology confirmed another relapse. ? ?She is status post cryoablation of a right renal neoplasm on August 21, 2018. ?  ?Current therapy: Active surveillance. ?  ?Interim History: Mrs. Sieh is here for a follow-up evaluation.  Since the last visit, she reports no major changes in her health.  She denies any recent hospitalizations or illnesses.  She denies any back pain or pathological fractures.  She does report occasional dyspnea on exertion.  He is currently under evaluation for possible liver disease and fecal incontinence and will have MRCP in the near future. ? ? ? ? ? ?Medications: Updated on review. ?Current Outpatient Medications  ?Medication Sig Dispense Refill  ? albuterol (VENTOLIN HFA) 108 (90 Base) MCG/ACT inhaler PLEASE SEE ATTACHED FOR DETAILED DIRECTIONS    ? allopurinol (ZYLOPRIM) 300 MG tablet TAKE 1 TABLET BY MOUTH EVERY DAY 90 tablet 1  ? aspirin EC 81 MG tablet Take 81 mg by mouth daily.    ? atorvastatin (LIPITOR) 20 MG tablet TAKE 1 TABLET BY MOUTH EVERY DAY 90 tablet 1  ? Calcium Carb-Cholecalciferol (CALCIUM 600 + D PO) Take 1 tablet by mouth daily.     ? carvedilol (COREG) 6.25 MG tablet Take 0.5 tablets (3.125 mg total) by mouth 2 (two) times daily with a meal. Pt needs an OV for future refills 180 tablet 1  ? diphenhydramine-acetaminophen (TYLENOL PM) 25-500 MG TABS Take 1 tablet by mouth at bedtime as needed (sleep).     ? furosemide (LASIX) 40 MG tablet TAKE 1 TABLET BY MOUTH EVERY DAY 90 tablet 3  ? glimepiride (AMARYL) 2 MG tablet TAKE 1 TABLET BY MOUTH EVERY DAY WITH BREAKFAST 90 tablet 1  ? hyoscyamine (LEVSIN SL) 0.125 MG SL tablet Place 1 tablet (0.125 mg total) under the tongue every 6 (six) hours as needed. 30 tablet 1  ? irbesartan (AVAPRO) 75 MG tablet TAKE 1 TABLET BY MOUTH EVERY DAY 90 tablet 1  ? Lancets (ONETOUCH DELICA PLUS [id number]) MISC USE AS DIRECTED TO TEST DAILY 100 each 12  ? meclizine (ANTIVERT) 12.5 MG tablet Take 1 tablet (12.5 mg total) by mouth 3 (three) times daily as needed for dizziness. As needed for   Dizziness or nausea 30 tablet 1  ? metFORMIN (GLUCOPHAGE) 500 MG tablet TAKE 1 TABLET BY MOUTH 2 TIMES DAILY WITH A MEAL. 180 tablet 1  ? methocarbamol (ROBAXIN) 500 MG tablet Take 1 tablet (500 mg total) by mouth 4 (four) times daily. 45 tablet 1  ? nystatin cream (MYCOSTATIN) Apply 1 application topically 2 (two) times daily. 30 g 1  ? ondansetron (ZOFRAN-ODT) 4 MG disintegrating tablet Take by mouth.    ?  ONETOUCH VERIO test strip CEHCK BLOOD SUGAR DAILY AS NEEDED 100 strip 12  ? Vitamin D, Ergocalciferol, (DRISDOL) 1.25 MG (50000 UNIT) CAPS capsule TAKE 1 CAPSULE (50,000 UNITS TOTAL) BY MOUTH EVERY 7 (SEVEN) DAYS 12 capsule 1  ? ?No current facility-administered medications for this visit.  ? ?Physical exam: ?Blood pressure (!) 95/51, pulse 86, temperature (!) 97.2 ?F (36.2 ?C), resp. rate 18, weight 214 lb 3.2 oz (97.2 kg), SpO2 93 %. ? ?ECOG 1 ? ? ? ? ?General appearance: Alert, awake without any distress. ?Head: Atraumatic without abnormalities ?Oropharynx: Without any thrush or ulcers. ?Eyes: No scleral icterus. ?Lymph  nodes: No lymphadenopathy noted in the cervical, supraclavicular, or axillary nodes ?Heart:regular rate and rhythm, without any murmurs or gallops.   ?Lung: Clear to auscultation without any rhonchi, wheezes or dullness to percussion. ?Abdomin: Soft, nontender without any shifting dullness or ascites. ?Musculoskeletal: No clubbing or cyanosis. ?Neurological: No motor or sensory deficits. ?Skin: No rashes or lesions. ? ? ? ? ? ?Lab Results: ?Lab Results  ?Component Value Date  ? WBC 8.5 03/24/2021  ? HGB 12.2 03/24/2021  ? HCT 37.8 03/24/2021  ? MCV 99.2 03/24/2021  ? PLT 187 03/24/2021  ? ?  Chemistry   ?   ?Component Value Date/Time  ? NA 136 03/24/2021 1044  ? NA 139 08/23/2017 0933  ? NA 140 11/01/2016 0818  ? K 4.9 03/24/2021 1044  ? K 4.4 11/01/2016 0818  ? CL 103 03/24/2021 1044  ? CL 107 04/16/2012 0923  ? CO2 27 03/24/2021 1044  ? CO2 26 11/01/2016 0818  ? BUN 39 (H) 03/24/2021 1044  ? BUN 24 08/23/2017 0933  ? BUN 17.9 11/01/2016 0818  ? CREATININE 1.48 (H) 03/24/2021 1044  ? CREATININE 1.1 11/01/2016 0818  ? GLU 151 (H) 09/30/2019 1014  ?    ?Component Value Date/Time  ? CALCIUM 11.3 (H) 03/24/2021 1044  ? CALCIUM 10.3 11/01/2016 0818  ? ALKPHOS 58 03/24/2021 1044  ? ALKPHOS 72 11/01/2016 0818  ? AST 34 03/24/2021 1044  ? AST 75 (H) 11/01/2016 0818  ? ALT 24 03/24/2021 1044  ? ALT 75 (H) 11/01/2016 0818  ? BILITOT 0.9 03/24/2021 1044  ? BILITOT 0.78 11/01/2016 0818  ?  ? ?IMPRESSION: ?1. Interval improvement and near complete resolution of previously ?described clustered nodules within the posterior right costophrenic ?sulcus suggestive of resolving infectious/inflammatory process. ?2. Patient is status post left nephrectomy without evidence for ?localized recurrence. ?3. Stable 2 cm left lateral omental nodule. Recommend attention on ?follow-up. ?4. Colonic diverticulosis without evidence for acute diverticulitis. ?5. Aortic atherosclerosis. ?6. Cirrhotic morphology of the liver. ? ? ?Impression and  Plan: ? ?76 year old woman with: ?  ?  ?  ?1. Stage IV clear-cell renal cell carcinoma with pulmonary involvement that has been resected on few occasions.  She has no evidence of active disease at this time.   ?  ?CT scan obtained on March 24, 2021 was personally reviewed and showed no evidence of relapsed disease at this time.  The natural course of this disease was reviewed and risk of relapse was assessed.  At this time I recommended continued active surveillance and institute systemic therapy if she has relapsed disease that cannot be treated locally.  Treatment options including immunotherapy, oral targeted therapy or combination of the above. ?  ?2.    Right kidney tumor: Status post ablative therapy without any evidence of relapse. ? ?3.  Fecal incontinence and elevated lipase: She will have MRCP  in the near future care gastroenterology. ? ? ? 4.  Follow-up: In 8 months for repeat follow-up and imaging studies. ?  ? ? 30  minutes were spent on this encounter.  The time was dedicated to reviewing laboratory data, disease status update and outlining future plan of care discussion. ? ? ?Zola Button, MD 03/31/2021 9:55 AM ? ?

## 2021-04-05 ENCOUNTER — Ambulatory Visit (HOSPITAL_COMMUNITY): Payer: No Typology Code available for payment source

## 2021-04-07 ENCOUNTER — Other Ambulatory Visit: Payer: Self-pay | Admitting: Oncology

## 2021-04-07 ENCOUNTER — Other Ambulatory Visit: Payer: Self-pay | Admitting: Family Medicine

## 2021-04-07 DIAGNOSIS — Z09 Encounter for follow-up examination after completed treatment for conditions other than malignant neoplasm: Secondary | ICD-10-CM

## 2021-04-14 ENCOUNTER — Other Ambulatory Visit: Payer: Self-pay

## 2021-04-14 ENCOUNTER — Other Ambulatory Visit: Payer: Self-pay | Admitting: Gastroenterology

## 2021-04-14 ENCOUNTER — Ambulatory Visit (HOSPITAL_COMMUNITY)
Admission: RE | Admit: 2021-04-14 | Discharge: 2021-04-14 | Disposition: A | Discharge status: Home / Self Care | Payer: No Typology Code available for payment source | Source: Ambulatory Visit | Attending: Gastroenterology | Admitting: Gastroenterology

## 2021-04-14 DIAGNOSIS — Z860100 Personal history of colon polyps, unspecified: Secondary | ICD-10-CM

## 2021-04-14 DIAGNOSIS — R159 Full incontinence of feces: Secondary | ICD-10-CM | POA: Diagnosis not present

## 2021-04-14 DIAGNOSIS — K668 Other specified disorders of peritoneum: Secondary | ICD-10-CM | POA: Diagnosis not present

## 2021-04-14 DIAGNOSIS — R748 Abnormal levels of other serum enzymes: Secondary | ICD-10-CM | POA: Diagnosis not present

## 2021-04-14 DIAGNOSIS — R197 Diarrhea, unspecified: Secondary | ICD-10-CM | POA: Diagnosis not present

## 2021-04-14 DIAGNOSIS — N281 Cyst of kidney, acquired: Secondary | ICD-10-CM | POA: Diagnosis not present

## 2021-04-14 DIAGNOSIS — R195 Other fecal abnormalities: Secondary | ICD-10-CM

## 2021-04-14 MED ORDER — GADOBUTROL 1 MMOL/ML IV SOLN
10.0000 mL | Freq: Once | INTRAVENOUS | Status: AC | PRN
  Administered 2021-04-14: 10 mL via INTRAVENOUS

## 2021-04-22 ENCOUNTER — Ambulatory Visit
Admission: RE | Admit: 2021-04-22 | Discharge: 2021-04-22 | Disposition: A | Discharge status: Home / Self Care | Payer: No Typology Code available for payment source | Source: Ambulatory Visit | Attending: Family Medicine | Admitting: Family Medicine

## 2021-04-22 DIAGNOSIS — R922 Inconclusive mammogram: Secondary | ICD-10-CM | POA: Diagnosis not present

## 2021-04-22 DIAGNOSIS — Z09 Encounter for follow-up examination after completed treatment for conditions other than malignant neoplasm: Secondary | ICD-10-CM

## 2021-04-22 DIAGNOSIS — R921 Mammographic calcification found on diagnostic imaging of breast: Secondary | ICD-10-CM | POA: Diagnosis not present

## 2021-04-27 ENCOUNTER — Ambulatory Visit: Payer: No Typology Code available for payment source | Admitting: Orthopaedic Surgery

## 2021-05-06 ENCOUNTER — Other Ambulatory Visit: Payer: Self-pay | Admitting: Internal Medicine

## 2021-05-06 ENCOUNTER — Other Ambulatory Visit: Payer: Self-pay | Admitting: Family Medicine

## 2021-05-07 ENCOUNTER — Other Ambulatory Visit: Payer: Self-pay | Admitting: Family Medicine

## 2021-05-11 ENCOUNTER — Other Ambulatory Visit: Payer: Self-pay

## 2021-05-11 MED ORDER — METFORMIN HCL 500 MG PO TABS: 500.0000 mg | ORAL_TABLET | Freq: Two times a day (BID) | ORAL | 1 refills | Status: DC

## 2021-05-11 NOTE — Progress Notes (Signed)
? ?Subjective:  ? ? Patient ID: Amanda Crawford, female    DOB: 08/16/1945, 76 y.o.   MRN: [id number] ? ?Chief Complaint  ?Patient presents with  ? Annual Exam  ? ? ?HPI ?Patient is in today for her annual physical exam and follow-up on chronic medical conditions.  No recent febrile illness or hospitalizations.  She was seen earlier in the week for a recurrence of bronchitis and is now on amoxicillin and prednisone.  Her cough and congestion are improving but still present.  She does note the prednisone is disrupting her sleep.  She denies any other recent illness or acute concerns.  She acknowledges minimal activity levels lately.  Tries to maintain a heart healthy diet but eats carbohydrates regularly. Denies CP/palp/SOB/HA/congestion/fevers/GI or GU c/o. Taking meds as prescribed  ? ? ?Past Medical History:  ?Diagnosis Date  ? Allergic state 03/12/2015  ? Arthritis   ? Colon polyp 11/19/2014  ? Dermatitis 03/12/2015  ? Diarrhea 06/29/2016  ? Diverticulitis   ? DM (diabetes mellitus), type 2 ([place]) 06/29/2016  ? GERD (gastroesophageal reflux disease)   ? occ  ? Gout 03/12/2015  ? H/O measles   ? H/O mumps   ? Headache(784.0)   ? migraines  ? History of chicken pox 11/23/2014  ? Hyperglycemia 06/29/2016  ? Lung cancer ([place])   ? Migraine 11/23/2014  ? Obesity 11/23/2014  ? Pneumonia   ? child  ? Preventative health care 09/12/2015  ? Primary hypertension 12/15/2020  ? Renal cell cancer ([place])   ? renal cell ca dx 9/01 and 8/08;  ? Renal insufficiency   ? Shortness of breath   ? occ  ? Skin lesion of breast 03/12/2015  ? ? ?Past Surgical History:  ?Procedure Laterality Date  ? ABDOMINAL HYSTERECTOMY    ? APPENDECTOMY    ? CARDIAC CATHETERIZATION    ? yrs ago neg  ? CHOLECYSTECTOMY    ? COLONOSCOPY  2018  ? ESOPHAGOGASTRODUODENOSCOPY    ? years ago  ? IR RADIOLOGIST EVAL & MGMT  07/17/2018  ? IR RADIOLOGIST EVAL & MGMT  09/12/2018  ? IR RADIOLOGIST EVAL & MGMT  06/18/2019  ? IR RADIOLOGIST EVAL & MGMT  01/13/2020  ? IR RADIOLOGIST  EVAL & MGMT  07/08/2020  ? IR RADIOLOGIST EVAL & MGMT  10/12/2020  ? KIDNEY SURGERY  2001  ? Removed of left kidney   ? LUNG CANCER SURGERY Left 08  ? RADIOLOGY WITH ANESTHESIA N/A 08/21/2018  ? Procedure: CT WITH ANESTHESIA RENAL CRYOABLATION;  Surgeon: Arne Cleveland, MD;  Location: [facility];  Service: Radiology;  Laterality: N/A;  ? RENAL MASS EXCISION Left 01  ? RIGHT/LEFT HEART CATH AND CORONARY ANGIOGRAPHY N/A 07/27/2016  ? Procedure: Right/Left Heart Cath and Coronary Angiography;  Surgeon: Leonie Man, MD;  Location: [facility];  Service: Cardiovascular;  Laterality: N/A;  ? THORACOTOMY Left 05/09/2012  ? Procedure: THORACOTOMY MAJOR;  Surgeon: Gaye Pollack, MD;  Location: [facility];  Service: Thoracic;  Laterality: Left;  ? VIDEO BRONCHOSCOPY N/A 05/09/2012  ? Procedure: VIDEO BRONCHOSCOPY;  Surgeon: Gaye Pollack, MD;  Location: [facility];  Service: Thoracic;  Laterality: N/A;  ? WEDGE RESECTION Left 05/09/2012  ? Procedure: LEFT UPPER LOBE WEDGE RESECTION;  Surgeon: Gaye Pollack, MD;  Location: [facility];  Service: Thoracic;  Laterality: Left;  ? ? ?Family History  ?Problem Relation Age of Onset  ? Dementia Mother   ? Heart failure Father   ? Diabetes  Father   ? Hyperlipidemia Father   ? Heart disease Father   ? Hypertension Father   ? Asthma Brother   ? Asthma Daughter   ? Alcohol abuse Maternal Aunt   ? Colon cancer Paternal Aunt   ? Cancer Paternal Grandmother   ? Hearing loss Paternal Grandfather   ? Stroke Paternal Grandfather   ? ? ?Social History  ? ?Socioeconomic History  ? Marital status: Married  ?  Spouse name: Not on file  ? Number of children: Not on file  ? Years of education: Not on file  ? Highest education level: Not on file  ?Occupational History  ? Occupation: retired  ?Tobacco Use  ? Smoking status: Former  ?  Packs/day: 0.50  ?  Years: 35.00  ?  Pack years: 17.50  ?  Types: Cigarettes  ?  Quit date: 05/08/2006  ?  Years since quitting: 15.0  ? Smokeless tobacco: Never  ?Vaping Use  ?  Vaping Use: Never used  ?Substance and Sexual Activity  ? Alcohol use: Yes  ?  Comment: seldomly  ? Drug use: No  ? Sexual activity: Not on file  ?  Comment: lives with husband, no dietary restrictions.   ?Other Topics Concern  ? Not on file  ?Social History Narrative  ? Not on file  ? ?Social Determinants of Health  ? ?Financial Resource Strain: Low Risk   ? Difficulty of Paying Living Expenses: Not hard at all  ?Food Insecurity: No Food Insecurity  ? Worried About Charity fundraiser in the Last Year: Never true  ? Ran Out of Food in the Last Year: Never true  ?Transportation Needs: No Transportation Needs  ? Lack of Transportation (Medical): No  ? Lack of Transportation (Non-Medical): No  ?Physical Activity: Inactive  ? Days of Exercise per Week: 0 days  ? Minutes of Exercise per Session: 0 min  ?Stress: No Stress Concern Present  ? Feeling of Stress : Only a little  ?Social Connections: Moderately Integrated  ? Frequency of Communication with Friends and Family: More than three times a week  ? Frequency of Social Gatherings with Friends and Family: Once a week  ? Attends Religious Services: More than 4 times per year  ? Active Member of Clubs or Organizations: No  ? Attends Archivist Meetings: Never  ? Marital Status: Married  ?Intimate Partner Violence: Not At Risk  ? Fear of Current or Ex-Partner: No  ? Emotionally Abused: No  ? Physically Abused: No  ? Sexually Abused: No  ? ? ?Outpatient Medications Prior to Visit  ?Medication Sig Dispense Refill  ? albuterol (VENTOLIN HFA) 108 (90 Base) MCG/ACT inhaler PLEASE SEE ATTACHED FOR DETAILED DIRECTIONS    ? allopurinol (ZYLOPRIM) 300 MG tablet TAKE 1 TABLET BY MOUTH EVERY DAY 90 tablet 1  ? amoxicillin-clavulanate (AUGMENTIN) 875-125 MG tablet Take 1 tablet by mouth 2 (two) times daily.    ? aspirin EC 81 MG tablet Take 81 mg by mouth daily.    ? atorvastatin (LIPITOR) 20 MG tablet TAKE 1 TABLET BY MOUTH EVERY DAY 90 tablet 1  ? Calcium  Carb-Cholecalciferol (CALCIUM 600 + D PO) Take 1 tablet by mouth daily.    ? carvedilol (COREG) 6.25 MG tablet Take 0.5 tablets (3.125 mg total) by mouth 2 (two) times daily with a meal. Pt needs an OV for future refills 180 tablet 1  ? diphenhydramine-acetaminophen (TYLENOL PM) 25-500 MG TABS Take 1 tablet by mouth at bedtime  as needed (sleep).     ? furosemide (LASIX) 40 MG tablet TAKE 1 TABLET BY MOUTH EVERY DAY 90 tablet 3  ? glimepiride (AMARYL) 2 MG tablet TAKE 1 TABLET BY MOUTH EVERY DAY WITH BREAKFAST 90 tablet 1  ? hyoscyamine (LEVSIN SL) 0.125 MG SL tablet Place 1 tablet (0.125 mg total) under the tongue every 6 (six) hours as needed. 30 tablet 1  ? irbesartan (AVAPRO) 75 MG tablet TAKE 1 TABLET BY MOUTH EVERY DAY 90 tablet 1  ? Lancets (ONETOUCH DELICA PLUS [id number]) MISC USE AS DIRECTED TO TEST DAILY 100 each 12  ? meclizine (ANTIVERT) 12.5 MG tablet Take 1 tablet (12.5 mg total) by mouth 3 (three) times daily as needed for dizziness. As needed for   Dizziness or nausea 30 tablet 1  ? metFORMIN (GLUCOPHAGE) 500 MG tablet Take 1 tablet (500 mg total) by mouth 2 (two) times daily with a meal. 180 tablet 1  ? methocarbamol (ROBAXIN) 500 MG tablet Take 1 tablet (500 mg total) by mouth 4 (four) times daily. 45 tablet 1  ? nystatin cream (MYCOSTATIN) Apply 1 application topically 2 (two) times daily. 30 g 1  ? ondansetron (ZOFRAN-ODT) 4 MG disintegrating tablet Take by mouth.    ? ONETOUCH VERIO test strip CEHCK BLOOD SUGAR DAILY AS NEEDED 100 strip 12  ? predniSONE (STERAPRED UNI-PAK 21 TAB) 10 MG (21) TBPK tablet Take by mouth as directed.    ? Vitamin D, Ergocalciferol, (DRISDOL) 1.25 MG (50000 UNIT) CAPS capsule TAKE 1 CAPSULE (50,000 UNITS TOTAL) BY MOUTH EVERY 7 (SEVEN) DAYS 12 capsule 1  ? ?No facility-administered medications prior to visit.  ? ? ?No Known Allergies ? ?Review of Systems  ?Constitutional:  Negative for chills, fever and malaise/fatigue.  ?HENT:  Negative for congestion and hearing  loss.   ?Eyes:  Negative for discharge.  ?Respiratory:  Negative for cough, sputum production and shortness of breath.   ?Cardiovascular:  Negative for chest pain, palpitations and leg swelling.  ?Gastrointestinal:  Negative f

## 2021-05-12 ENCOUNTER — Encounter: Payer: Self-pay | Admitting: Family Medicine

## 2021-05-12 ENCOUNTER — Ambulatory Visit (INDEPENDENT_AMBULATORY_CARE_PROVIDER_SITE_OTHER): Payer: No Typology Code available for payment source | Admitting: Family Medicine

## 2021-05-12 VITALS — BP 118/70 | HR 77 | Resp 22 | Ht 64.0 in | Wt 213.2 lb

## 2021-05-12 DIAGNOSIS — Z0000 Encounter for general adult medical examination without abnormal findings: Secondary | ICD-10-CM

## 2021-05-12 DIAGNOSIS — E119 Type 2 diabetes mellitus without complications: Secondary | ICD-10-CM

## 2021-05-12 DIAGNOSIS — K219 Gastro-esophageal reflux disease without esophagitis: Secondary | ICD-10-CM

## 2021-05-12 DIAGNOSIS — M8580 Other specified disorders of bone density and structure, unspecified site: Secondary | ICD-10-CM

## 2021-05-12 DIAGNOSIS — K769 Liver disease, unspecified: Secondary | ICD-10-CM

## 2021-05-12 DIAGNOSIS — N289 Disorder of kidney and ureter, unspecified: Secondary | ICD-10-CM

## 2021-05-12 DIAGNOSIS — R7989 Other specified abnormal findings of blood chemistry: Secondary | ICD-10-CM

## 2021-05-12 DIAGNOSIS — R32 Unspecified urinary incontinence: Secondary | ICD-10-CM | POA: Diagnosis not present

## 2021-05-12 DIAGNOSIS — R748 Abnormal levels of other serum enzymes: Secondary | ICD-10-CM | POA: Diagnosis not present

## 2021-05-12 DIAGNOSIS — I10 Essential (primary) hypertension: Secondary | ICD-10-CM | POA: Diagnosis not present

## 2021-05-12 DIAGNOSIS — J40 Bronchitis, not specified as acute or chronic: Secondary | ICD-10-CM | POA: Insufficient documentation

## 2021-05-12 DIAGNOSIS — E785 Hyperlipidemia, unspecified: Secondary | ICD-10-CM

## 2021-05-12 DIAGNOSIS — R197 Diarrhea, unspecified: Secondary | ICD-10-CM

## 2021-05-12 DIAGNOSIS — M1A9XX0 Chronic gout, unspecified, without tophus (tophi): Secondary | ICD-10-CM | POA: Diagnosis not present

## 2021-05-12 LAB — URINALYSIS
Bilirubin Urine: NEGATIVE
Hgb urine dipstick: NEGATIVE
Ketones, ur: NEGATIVE
Leukocytes,Ua: NEGATIVE
Nitrite: NEGATIVE
Specific Gravity, Urine: 1.015 (ref 1.000–1.030)
Total Protein, Urine: NEGATIVE
Urine Glucose: NEGATIVE
Urobilinogen, UA: 0.2 (ref 0.0–1.0)
pH: 5.5 (ref 5.0–8.0)

## 2021-05-12 LAB — COMPREHENSIVE METABOLIC PANEL
ALT: 40 U/L — ABNORMAL HIGH (ref 0–35)
AST: 37 U/L (ref 0–37)
Albumin: 4.2 g/dL (ref 3.5–5.2)
Alkaline Phosphatase: 56 U/L (ref 39–117)
BUN: 45 mg/dL — ABNORMAL HIGH (ref 6–23)
CO2: 25 mEq/L (ref 19–32)
Calcium: 10.2 mg/dL (ref 8.4–10.5)
Chloride: 103 mEq/L (ref 96–112)
Creatinine, Ser: 1.62 mg/dL — ABNORMAL HIGH (ref 0.40–1.20)
GFR: 30.35 mL/min — ABNORMAL LOW (ref >60.00)
Glucose, Bld: 61 mg/dL — ABNORMAL LOW (ref 70–99)
Potassium: 5.2 mEq/L — ABNORMAL HIGH (ref 3.5–5.1)
Sodium: 136 mEq/L (ref 135–145)
Total Bilirubin: 0.8 mg/dL (ref 0.2–1.2)
Total Protein: 7 g/dL (ref 6.0–8.3)

## 2021-05-12 LAB — MAGNESIUM: Magnesium: 2.1 mg/dL (ref 1.5–2.5)

## 2021-05-12 LAB — LIPASE: Lipase: 117 U/L — ABNORMAL HIGH (ref 11.0–59.0)

## 2021-05-12 NOTE — Assessment & Plan Note (Signed)
Still struggling with loose stool and incontinent episodes at times, follows with [facility] Dr Bryan Lemma ?

## 2021-05-12 NOTE — Assessment & Plan Note (Addendum)
Patient encouraged to maintain heart healthy diet, regular exercise, adequate sleep. Consider daily probiotics. Take medications as prescribed. Labs ordered and reviewed. Dexa June 2022 repeat in 2 years, [facility] March 2023 repeat next year. Colonoscopy 2018 may repeat soon due to symptoms ?

## 2021-05-12 NOTE — Patient Instructions (Signed)
Preventive Care 34 Years and Older, Female ?Preventive care refers to lifestyle choices and visits with your health care provider that can promote health and wellness. Preventive care visits are also called wellness exams. ?What can I expect for my preventive care visit? ?Counseling ?Your health care provider may ask you questions about your: ?Medical history, including: ?Past medical problems. ?Family medical history. ?Pregnancy and menstrual history. ?History of falls. ?Current health, including: ?Memory and ability to understand (cognition). ?Emotional well-being. ?Home life and relationship well-being. ?Sexual activity and sexual health. ?Lifestyle, including: ?Alcohol, nicotine or tobacco, and drug use. ?Access to firearms. ?Diet, exercise, and sleep habits. ?Work and work Statistician. ?Sunscreen use. ?Safety issues such as seatbelt and bike helmet use. ?Physical exam ?Your health care provider will check your: ?Height and weight. These may be used to calculate your BMI (body mass index). BMI is a measurement that tells if you are at a healthy weight. ?Waist circumference. This measures the distance around your waistline. This measurement also tells if you are at a healthy weight and may help predict your risk of certain diseases, such as type 2 diabetes and high blood pressure. ?Heart rate and blood pressure. ?Body temperature. ?Skin for abnormal spots. ?What immunizations do I need? ?Vaccines are usually given at various ages, according to a schedule. Your health care provider will recommend vaccines for you based on your age, medical history, and lifestyle or other factors, such as travel or where you work. ?What tests do I need? ?Screening ?Your health care provider may recommend screening tests for certain conditions. This may include: ?Lipid and cholesterol levels. ?Hepatitis C test. ?Hepatitis B test. ?HIV (human immunodeficiency virus) test. ?STI (sexually transmitted infection) testing, if you are at  risk. ?Lung cancer screening. ?Colorectal cancer screening. ?Diabetes screening. This is done by checking your blood sugar (glucose) after you have not eaten for a while (fasting). ?Mammogram. Talk with your health care provider about how often you should have regular mammograms. ?BRCA-related cancer screening. This may be done if you have a family history of breast, ovarian, tubal, or peritoneal cancers. ?Bone density scan. This is done to screen for osteoporosis. ?Talk with your health care provider about your test results, treatment options, and if necessary, the need for more tests. ?Follow these instructions at home: ?Eating and drinking ? ?Eat a diet that includes fresh fruits and vegetables, whole grains, lean protein, and low-fat dairy products. Limit your intake of foods with high amounts of sugar, saturated fats, and salt. ?Take vitamin and mineral supplements as recommended by your health care provider. ?Do not drink alcohol if your health care provider tells you not to drink. ?If you drink alcohol: ?Limit how much you have to 0-1 drink a day. ?Know how much alcohol is in your drink. In the [place] one drink equals one 12 oz bottle of beer (355 mL), one 5 oz glass of wine (148 mL), or one 1? oz glass of hard liquor (44 mL). ?Lifestyle ?Brush your teeth every morning and night with fluoride toothpaste. Floss one time each day. ?Exercise for at least 30 minutes 5 or more days each week. ?Do not use any products that contain nicotine or tobacco. These products include cigarettes, chewing tobacco, and vaping devices, such as e-cigarettes. If you need help quitting, ask your health care provider. ?Do not use drugs. ?If you are sexually active, practice safe sex. Use a condom or other form of protection in order to prevent STIs. ?Take aspirin only as told by your  health care provider. Make sure that you understand how much to take and what form to take. Work with your health care provider to find out whether it  is safe and beneficial for you to take aspirin daily. ?Ask your health care provider if you need to take a cholesterol-lowering medicine (statin). ?Find healthy ways to manage stress, such as: ?Meditation, yoga, or listening to music. ?Journaling. ?Talking to a trusted person. ?Spending time with friends and family. ?Minimize exposure to UV radiation to reduce your risk of skin cancer. ?Safety ?Always wear your seat belt while driving or riding in a vehicle. ?Do not drive: ?If you have been drinking alcohol. Do not ride with someone who has been drinking. ?When you are tired or distracted. ?While texting. ?If you have been using any mind-altering substances or drugs. ?Wear a helmet and other protective equipment during sports activities. ?If you have firearms in your house, make sure you follow all gun safety procedures. ?What's next? ?Visit your health care provider once a year for an annual wellness visit. ?Ask your health care provider how often you should have your eyes and teeth checked. ?Stay up to date on all vaccines. ?This information is not intended to replace advice given to you by your health care provider. Make sure you discuss any questions you have with your health care provider. ?Document Revised: 07/14/2020 Document Reviewed: 07/14/2020 ?Elsevier Patient Education ? [place]. ? ?

## 2021-05-12 NOTE — Assessment & Plan Note (Signed)
Avoid offending foods, start probiotics. Do not eat large meals in late evening and consider raising head of bed.  

## 2021-05-12 NOTE — Assessment & Plan Note (Signed)
Responding to antibiotic and prednisone but having trouble sleeping, move prednisone away from bed time and minimize carbohydrate intake ?

## 2021-05-12 NOTE — Assessment & Plan Note (Signed)
Hydrate and monitor 

## 2021-05-12 NOTE — Assessment & Plan Note (Signed)
Recent imaging suggestive of cirrhosis. Encouraged to minimize Tylenol, alcohol, may use small amounts occasionally of Tylenol ?

## 2021-05-12 NOTE — Assessment & Plan Note (Signed)
Recheck today. 

## 2021-05-12 NOTE — Assessment & Plan Note (Signed)
Well controlled, no changes to meds. Encouraged heart healthy diet such as the DASH diet and exercise as tolerated.  °

## 2021-05-12 NOTE — Assessment & Plan Note (Signed)
Encouraged to get adequate exercise, calcium and vitamin d intake 

## 2021-05-12 NOTE — Assessment & Plan Note (Signed)
Supplement and monitor 

## 2021-05-12 NOTE — Assessment & Plan Note (Signed)
hgba1c acceptable, minimize simple carbs. Increase exercise as tolerated. Continue current meds 

## 2021-05-12 NOTE — Assessment & Plan Note (Signed)
Encourage heart healthy diet such as MIND or DASH diet, increase exercise, avoid trans fats, simple carbohydrates and processed foods, consider a krill or fish or flaxseed oil cap daily.  °

## 2021-05-13 ENCOUNTER — Other Ambulatory Visit: Payer: Self-pay

## 2021-05-13 LAB — URINE CULTURE
MICRO NUMBER:: 13259672
Result:: NO GROWTH
SPECIMEN QUALITY:: ADEQUATE

## 2021-05-13 MED ORDER — LANTUS SOLOSTAR 100 UNIT/ML ~~LOC~~ SOPN: 10.0000 [IU] | PEN_INJECTOR | Freq: Every day | SUBCUTANEOUS | 99 refills | Status: DC

## 2021-05-17 ENCOUNTER — Ambulatory Visit: Payer: No Typology Code available for payment source | Admitting: *Deleted

## 2021-05-17 MED ORDER — PEN NEEDLES 33G X 4 MM MISC
1 refills | Status: DC
Start: 2021-05-17 — End: 2021-11-21

## 2021-05-17 NOTE — Progress Notes (Signed)
Pt is here for injection education. Pt will be taking Lantus 10 units. Pt supplied her own Lantus pen. Demonstrated and walked through proper injection technique with pt.   ?  ?Pt verbalized understanding and explained back to me injection instructions. Pt now feels comfortable self administering Lantus.   ? ?

## 2021-05-18 ENCOUNTER — Other Ambulatory Visit: Payer: No Typology Code available for payment source

## 2021-05-18 DIAGNOSIS — R748 Abnormal levels of other serum enzymes: Secondary | ICD-10-CM

## 2021-05-18 DIAGNOSIS — R195 Other fecal abnormalities: Secondary | ICD-10-CM

## 2021-05-18 DIAGNOSIS — Z86010 Personal history of colonic polyps: Secondary | ICD-10-CM | POA: Diagnosis not present

## 2021-05-18 DIAGNOSIS — R197 Diarrhea, unspecified: Secondary | ICD-10-CM

## 2021-05-18 DIAGNOSIS — R159 Full incontinence of feces: Secondary | ICD-10-CM

## 2021-05-22 ENCOUNTER — Other Ambulatory Visit: Payer: Self-pay | Admitting: Internal Medicine

## 2021-05-22 LAB — GI PROFILE, STOOL, PCR

## 2021-05-23 LAB — CALPROTECTIN, FECAL: Calprotectin, Fecal: 22 ug/g (ref 0–120)

## 2021-05-26 LAB — PANCREATIC ELASTASE, FECAL: Pancreatic Elastase-1, Stool: 392 mcg/g

## 2021-06-03 ENCOUNTER — Telehealth: Payer: Self-pay | Admitting: Family Medicine

## 2021-06-03 NOTE — Telephone Encounter (Signed)
Pt called stating she has had a reaction to the Insulin that she was put on. Pt stated that her blood sugar has gone up since she has started taking it and her last reading (this morning) was 172 and b/w 5.3-5.4 is was in the 180s. Please Advise. ?

## 2021-06-06 ENCOUNTER — Telehealth: Payer: Self-pay | Admitting: Family Medicine

## 2021-06-06 NOTE — Telephone Encounter (Signed)
LMOM for pt to return call. 

## 2021-06-06 NOTE — Telephone Encounter (Signed)
Pt called back and was given provider instructions.  Pt voiced understanding. ?

## 2021-06-08 NOTE — Telephone Encounter (Signed)
Opened in error

## 2021-06-15 ENCOUNTER — Other Ambulatory Visit: Payer: Self-pay | Admitting: Interventional Radiology

## 2021-06-15 DIAGNOSIS — R911 Solitary pulmonary nodule: Secondary | ICD-10-CM

## 2021-06-29 ENCOUNTER — Ambulatory Visit (HOSPITAL_COMMUNITY)
Admission: RE | Admit: 2021-06-29 | Discharge: 2021-06-29 | Disposition: A | Discharge status: Home / Self Care | Payer: No Typology Code available for payment source | Attending: Gastroenterology | Admitting: Gastroenterology

## 2021-06-29 ENCOUNTER — Encounter (HOSPITAL_COMMUNITY)
Admission: RE | Disposition: A | Discharge status: Home / Self Care | Payer: Self-pay | Source: Home / Self Care | Attending: Gastroenterology

## 2021-06-29 ENCOUNTER — Encounter (HOSPITAL_COMMUNITY): Payer: Self-pay | Admitting: Gastroenterology

## 2021-06-29 DIAGNOSIS — R197 Diarrhea, unspecified: Secondary | ICD-10-CM | POA: Diagnosis not present

## 2021-06-29 DIAGNOSIS — R194 Change in bowel habit: Secondary | ICD-10-CM | POA: Diagnosis not present

## 2021-06-29 DIAGNOSIS — R159 Full incontinence of feces: Secondary | ICD-10-CM | POA: Diagnosis not present

## 2021-06-29 HISTORY — PX: ANAL RECTAL MANOMETRY: SHX6358

## 2021-06-29 SURGERY — MANOMETRY, ANORECTAL
Anesthesia: Choice

## 2021-06-29 NOTE — Progress Notes (Signed)
Anal manometry done per protocol. Pt tolerated well without distress or complication

## 2021-07-07 ENCOUNTER — Ambulatory Visit: Payer: Medicare HMO

## 2021-07-12 DIAGNOSIS — R159 Full incontinence of feces: Secondary | ICD-10-CM

## 2021-07-14 ENCOUNTER — Telehealth: Payer: Self-pay

## 2021-07-14 ENCOUNTER — Ambulatory Visit (INDEPENDENT_AMBULATORY_CARE_PROVIDER_SITE_OTHER): Payer: No Typology Code available for payment source

## 2021-07-14 VITALS — Ht 64.0 in | Wt 213.0 lb

## 2021-07-14 DIAGNOSIS — Z0000 Encounter for general adult medical examination without abnormal findings: Secondary | ICD-10-CM

## 2021-07-14 DIAGNOSIS — I10 Essential (primary) hypertension: Secondary | ICD-10-CM

## 2021-07-14 DIAGNOSIS — R748 Abnormal levels of other serum enzymes: Secondary | ICD-10-CM

## 2021-07-14 DIAGNOSIS — R7989 Other specified abnormal findings of blood chemistry: Secondary | ICD-10-CM

## 2021-07-14 DIAGNOSIS — E785 Hyperlipidemia, unspecified: Secondary | ICD-10-CM

## 2021-07-14 NOTE — Telephone Encounter (Addendum)
Pt would like to know if she should still take the prescribed Vitamin D or stop taking it. Also would like to know if she should be concerned because her blood sugar are 150's -180.

## 2021-07-14 NOTE — Progress Notes (Cosign Needed Addendum)
Subjective:   Amanda Crawford is a 76 y.o. female who presents for Medicare Annual (Subsequent) preventive examination.  I connected with  Amanda Crawford on 07/14/21 by a audio enabled telemedicine application and verified that I am speaking with the correct person using two identifiers.  Patient Location: Home  Provider Location: Home Office  I discussed the limitations of evaluation and management by telemedicine. The patient expressed understanding and agreed to proceed.   Review of Systems     Cardiac Risk Factors include: advanced age (>58men, >56 women);diabetes mellitus;dyslipidemia;obesity (BMI >30kg/m2)     Objective:    Today's Vitals   07/14/21 0827 07/14/21 0828  Weight: 213 lb (96.6 kg)   Height: 5\' 4"  (1.626 m)   PainSc:  4    Body mass index is 36.56 kg/m.     07/14/2021    8:26 AM 07/05/2020    8:23 AM 02/13/2019    2:37 PM 08/21/2018    4:42 PM 08/21/2018    1:34 PM 08/21/2018    7:37 AM 08/19/2018    1:41 PM  Advanced Directives  Does Patient Have a Medical Advance Directive? Yes Yes Yes Yes Yes Yes Yes  Type of Paramedic of [place];Out of facility DNR (pink MOST or yellow form);Living will Living will [place];Living will Living will Living will Living will Living will  Does patient want to make changes to medical advance directive? No - Patient declined  No - Patient declined  No - Patient declined No - Patient declined   Copy of [place] in Chart? No - copy requested  Yes - validated most recent copy scanned in chart (See row information)        Current Medications (verified) Outpatient Encounter Medications as of 07/14/2021  Medication Sig   albuterol (VENTOLIN HFA) 108 (90 Base) MCG/ACT inhaler PLEASE SEE ATTACHED FOR DETAILED DIRECTIONS   allopurinol (ZYLOPRIM) 300 MG tablet TAKE 1 TABLET BY MOUTH EVERY DAY   amoxicillin-clavulanate (AUGMENTIN) 875-125 MG tablet Take 1 tablet by mouth  2 (two) times daily.   aspirin EC 81 MG tablet Take 81 mg by mouth daily.   atorvastatin (LIPITOR) 20 MG tablet TAKE 1 TABLET BY MOUTH EVERY DAY   Calcium Carb-Cholecalciferol (CALCIUM 600 + D PO) Take 1 tablet by mouth daily.   carvedilol (COREG) 6.25 MG tablet Take 1 tablet (6.25 mg total) by mouth 2 (two) times daily with a meal.   diphenhydramine-acetaminophen (TYLENOL PM) 25-500 MG TABS Take 1 tablet by mouth at bedtime as needed (sleep).    furosemide (LASIX) 40 MG tablet TAKE 1 TABLET BY MOUTH EVERY DAY   glimepiride (AMARYL) 2 MG tablet TAKE 1 TABLET BY MOUTH EVERY DAY WITH BREAKFAST   hyoscyamine (LEVSIN SL) 0.125 MG SL tablet Place 1 tablet (0.125 mg total) under the tongue every 6 (six) hours as needed.   insulin glargine (LANTUS SOLOSTAR) 100 UNIT/ML Solostar Pen Inject 10 Units into the skin daily.   Insulin Pen Needle (PEN NEEDLES) 33G X 4 MM MISC Use as directed with Lantus once a day.   irbesartan (AVAPRO) 75 MG tablet TAKE 1 TABLET BY MOUTH EVERY DAY   Lancets (ONETOUCH DELICA PLUS [id number]) MISC USE AS DIRECTED TO TEST DAILY   meclizine (ANTIVERT) 12.5 MG tablet Take 1 tablet (12.5 mg total) by mouth 3 (three) times daily as needed for dizziness. As needed for   Dizziness or nausea   methocarbamol (ROBAXIN) 500 MG tablet  Take 1 tablet (500 mg total) by mouth 4 (four) times daily.   nystatin cream (MYCOSTATIN) Apply 1 application topically 2 (two) times daily.   ondansetron (ZOFRAN-ODT) 4 MG disintegrating tablet Take by mouth.   ONETOUCH VERIO test strip CEHCK BLOOD SUGAR DAILY AS NEEDED   predniSONE (STERAPRED UNI-PAK 21 TAB) 10 MG (21) TBPK tablet Take by mouth as directed.   Vitamin D, Ergocalciferol, (DRISDOL) 1.25 MG (50000 UNIT) CAPS capsule TAKE 1 CAPSULE (50,000 UNITS TOTAL) BY MOUTH EVERY 7 (SEVEN) DAYS   [DISCONTINUED] glyBURIDE-metformin (GLUCOVANCE) 1.25-250 MG tablet Take 1 tablet by mouth 2 (two) times daily with a meal.   No facility-administered encounter  medications on file as of 07/14/2021.    Allergies (verified) Patient has no known allergies.   History: Past Medical History:  Diagnosis Date   Allergic state 03/12/2015   Arthritis    Colon polyp 11/19/2014   Dermatitis 03/12/2015   Diarrhea 06/29/2016   Diverticulitis    DM (diabetes mellitus), type 2 ([place]) 06/29/2016   GERD (gastroesophageal reflux disease)    occ   Gout 03/12/2015   H/O measles    H/O mumps    Headache(784.0)    migraines   History of chicken pox 11/23/2014   Hyperglycemia 06/29/2016   Lung cancer ([place])    Migraine 11/23/2014   Obesity 11/23/2014   Pneumonia    child   Preventative health care 09/12/2015   Primary hypertension 12/15/2020   Renal cell cancer ([place])    renal cell ca dx 9/01 and 8/08;   Renal insufficiency    Shortness of breath    occ   Skin lesion of breast 03/12/2015   Past Surgical History:  Procedure Laterality Date   ABDOMINAL HYSTERECTOMY     ANAL RECTAL MANOMETRY N/A 06/29/2021   Procedure: ANO RECTAL MANOMETRY;  Surgeon: Lavena Bullion, DO;  Location: [facility];  Service: Gastroenterology;  Laterality: N/A;   APPENDECTOMY     CARDIAC CATHETERIZATION     yrs ago neg   CHOLECYSTECTOMY     COLONOSCOPY  2018   ESOPHAGOGASTRODUODENOSCOPY     years ago   IR RADIOLOGIST EVAL & MGMT  07/17/2018   IR RADIOLOGIST EVAL & MGMT  09/12/2018   IR RADIOLOGIST EVAL & MGMT  06/18/2019   IR RADIOLOGIST EVAL & MGMT  01/13/2020   IR RADIOLOGIST EVAL & MGMT  07/08/2020   IR RADIOLOGIST EVAL & MGMT  10/12/2020   KIDNEY SURGERY  2001   Removed of left kidney    LUNG CANCER SURGERY Left 08   RADIOLOGY WITH ANESTHESIA N/A 08/21/2018   Procedure: CT WITH ANESTHESIA RENAL CRYOABLATION;  Surgeon: Arne Cleveland, MD;  Location: [facility];  Service: Radiology;  Laterality: N/A;   RENAL MASS EXCISION Left 01   RIGHT/LEFT HEART CATH AND CORONARY ANGIOGRAPHY N/A 07/27/2016   Procedure: Right/Left Heart Cath and Coronary Angiography;  Surgeon: Leonie Man, MD;  Location: [facility];  Service: Cardiovascular;  Laterality: N/A;   THORACOTOMY Left 05/09/2012   Procedure: THORACOTOMY MAJOR;  Surgeon: Gaye Pollack, MD;  Location: [facility];  Service: Thoracic;  Laterality: Left;   VIDEO BRONCHOSCOPY N/A 05/09/2012   Procedure: VIDEO BRONCHOSCOPY;  Surgeon: Gaye Pollack, MD;  Location: [facility];  Service: Thoracic;  Laterality: N/A;   WEDGE RESECTION Left 05/09/2012   Procedure: LEFT UPPER LOBE WEDGE RESECTION;  Surgeon: Gaye Pollack, MD;  Location: [facility];  Service: Thoracic;  Laterality: Left;   Family  History  Problem Relation Age of Onset   Dementia Mother    Heart failure Father    Diabetes Father    Hyperlipidemia Father    Heart disease Father    Hypertension Father    Asthma Brother    Asthma Daughter    Alcohol abuse Maternal Aunt    Colon cancer Paternal Aunt    Cancer Paternal Grandmother    Hearing loss Paternal Grandfather    Stroke Paternal Grandfather    Social History   Socioeconomic History   Marital status: Married    Spouse name: Not on file   Number of children: Not on file   Years of education: Not on file   Highest education level: Not on file  Occupational History   Occupation: retired  Tobacco Use   Smoking status: Former    Packs/day: 0.50    Years: 35.00    Total pack years: 17.50    Types: Cigarettes    Quit date: 05/08/2006    Years since quitting: 15.1   Smokeless tobacco: Never  Vaping Use   Vaping Use: Never used  Substance and Sexual Activity   Alcohol use: Yes    Comment: seldomly   Drug use: No   Sexual activity: Not on file    Comment: lives with husband, no dietary restrictions.   Other Topics Concern   Not on file  Social History Narrative   Not on file   Social Determinants of Health   Financial Resource Strain: Low Risk  (07/05/2020)   Overall Financial Resource Strain (CARDIA)    Difficulty of Paying Living Expenses: Not hard at all  Food Insecurity: No Food  Insecurity (07/05/2020)   Hunger Vital Sign    Worried About Running Out of Food in the Last Year: Never true    Ran Out of Food in the Last Year: Never true  Transportation Needs: No Transportation Needs (07/05/2020)   PRAPARE - Hydrologist (Medical): No    Lack of Transportation (Non-Medical): No  Physical Activity: Inactive (07/05/2020)   Exercise Vital Sign    Days of Exercise per Week: 0 days    Minutes of Exercise per Session: 0 min  Stress: No Stress Concern Present (07/05/2020)   [place]    Feeling of Stress : Only a little  Social Connections: Moderately Integrated (07/05/2020)   Social Connection and Isolation Panel [NHANES]    Frequency of Communication with Friends and Family: More than three times a week    Frequency of Social Gatherings with Friends and Family: Once a week    Attends Religious Services: More than 4 times per year    Active Member of [employer] or Organizations: No    Attends Music therapist: Never    Marital Status: Married    Tobacco Counseling Counseling given: Not Answered   Clinical Intake:  Pre-visit preparation completed: Yes  Pain : 0-10 Pain Score: 4  Pain Location: Back Pain Descriptors / Indicators: Aching, Dull, Sore Pain Onset: More than a month ago Pain Frequency: Constant     BMI - recorded: 36.56 Nutritional Status: BMI > 30  Obese Nutritional Risks: Nausea/ vomitting/ diarrhea Diabetes: Yes CBG done?: No Did pt. bring in CBG monitor from home?: No  How often do you need to have someone help you when you read instructions, pamphlets, or other written materials from your doctor or pharmacy?: 1 - Never  Diabetic?yes Nutrition  Risk Assessment:  Has the patient had any N/V/D within the last 2 months?  Yes  Does the patient have any non-healing wounds?  No  Has the patient had any unintentional weight loss or weight gain?  No    Diabetes:  Is the patient diabetic?  Yes  If diabetic, was a CBG obtained today?  No  Did the patient bring in their glucometer from home?  No  How often do you monitor your CBG's? Once daily.   Financial Strains and Diabetes Management:  Are you having any financial strains with the device, your supplies or your medication? No .  Does the patient want to be seen by Chronic Care Management for management of their diabetes?  No  Would the patient like to be referred to a Nutritionist or for Diabetic Management?  No   Diabetic Exams:  Diabetic Eye Exam: Completed 07/22/20 Diabetic Foot Exam: Overdue, Pt has been advised about the importance in completing this exam. Pt is scheduled for diabetic foot exam on n/a.   Interpreter Needed?: No  Information entered by :: [place] of Daily Living    07/14/2021    8:32 AM  In your present state of health, do you have any difficulty performing the following activities:  Hearing? 0  Vision? 1  Difficulty concentrating or making decisions? 0  Walking or climbing stairs? 0  Dressing or bathing? 0  Doing errands, shopping? 0  Preparing Food and eating ? N  Using the Toilet? N  In the past six months, have you accidently leaked urine? Y  Do you have problems with loss of bowel control? Y  Managing your Medications? N  Managing your Finances? N  Housekeeping or managing your Housekeeping? N    Patient Care Team: [name], MD as PCP - General (Family Medicine) Debara Pickett Nadean Corwin, MD as PCP - Cardiology (Cardiology) Wyatt Portela, MD (Hematology and Oncology) Gaye Pollack, MD as Attending Physician (Cardiothoracic Surgery)  Indicate any recent Medical Services you may have received from other than [facility] providers in the past year (date may be approximate).     Assessment:   This is a routine wellness examination for Amanda Crawford.  Hearing/Vision screen No results found.  Dietary issues and exercise  activities discussed: Current Exercise Habits: The patient does not participate in regular exercise at present, Exercise limited by: None identified   Goals Addressed   None    Depression Screen    07/14/2021    8:27 AM 05/12/2021    9:55 AM 01/26/2021   11:25 AM 07/05/2020    8:27 AM 08/05/2019    8:44 AM 02/13/2019    2:43 PM 06/07/2017    9:09 AM  PHQ 2/9 Scores  PHQ - 2 Score 0 0 0 1 0 0 0    Fall Risk    07/14/2021    8:27 AM 05/12/2021    9:55 AM 01/26/2021   11:25 AM 07/05/2020    8:26 AM 08/05/2019    8:45 AM  Fall Risk   Falls in the past year? 0 0 0 0 0  Number falls in past yr: 0 0 0 0   Injury with Fall? 0 0 0 0   Risk for fall due to : No Fall Risks No Fall Risks     Follow up Falls evaluation completed Falls evaluation completed Falls evaluation completed Falls prevention discussed     FALL RISK PREVENTION PERTAINING TO THE HOME:  Any  stairs in or around the home? Yes  If so, are there any without handrails? No  Home free of loose throw rugs in walkways, pet beds, electrical cords, etc? Yes  Adequate lighting in your home to reduce risk of falls? Yes   ASSISTIVE DEVICES UTILIZED TO PREVENT FALLS:  Life alert? No  Use of a cane, walker or w/c? No  Grab bars in the bathroom? Yes  Shower chair or bench in shower? No  Elevated toilet seat or a handicapped toilet? No   TIMED UP AND GO:  Was the test performed? No .    Cognitive Function:        07/14/2021    8:34 AM  6CIT Screen  What Year? 0 points  What month? 0 points  What time? 0 points  Count back from 20 0 points  Months in reverse 0 points  Repeat phrase 0 points  Total Score 0 points    Immunizations Immunization History  Administered Date(s) Administered   Influenza, High Dose Seasonal PF 10/27/2016, 10/08/2017   Influenza, Seasonal, Injecte, Preservative Fre 11/21/2014   Influenza-Unspecified 11/21/2014, 10/31/2018, 11/20/2020   PFIZER Comirnaty(Gray Top)Covid-19 Tri-Sucrose Vaccine  07/27/2020   PFIZER(Purple Top)SARS-COV-2 Vaccination 03/14/2019, 04/08/2019, 10/01/2019   Pfizer Covid-19 Vaccine Bivalent Booster 36yrs & up 11/20/2020   Pneumococcal Conjugate-13 03/12/2015   Pneumococcal Polysaccharide-23 10/23/2016   Td 08/31/2015   Tdap 08/31/2015   Zoster Recombinat (Shingrix) 12/14/2017, 04/29/2018    TDAP status: Up to date  Flu Vaccine status: Up to date  Pneumococcal vaccine status: Up to date  Covid-19 vaccine status: Completed vaccines  Qualifies for Shingles Vaccine? No   Zostavax completed No   Shingrix Completed?: Yes  Screening Tests Health Maintenance  Topic Date Due   FOOT EXAM  Never done   OPHTHALMOLOGY EXAM  07/22/2021   INFLUENZA VACCINE  08/30/2021   HEMOGLOBIN A1C  08/31/2021   TETANUS/TDAP  08/30/2025   Pneumonia Vaccine 31+ Years old  Completed   DEXA SCAN  Completed   COVID-19 Vaccine  Completed   Hepatitis C Screening  Completed   Zoster Vaccines- Shingrix  Completed   HPV VACCINES  Aged Out   COLONOSCOPY (Pts 45-62yrs Insurance coverage will need to be confirmed)  Discontinued    Health Maintenance  Health Maintenance Due  Topic Date Due   FOOT EXAM  Never done    Colorectal cancer screening: No longer required.   Mammogram status: Completed 04/22/21. Repeat every year  Bone Density status: Completed 07/26/20. Results reflect: Bone density results: OSTEOPENIA. Repeat every 2 years.  Lung Cancer Screening: (Low Dose CT Chest recommended if Age 63-80 years, 30 pack-year currently smoking OR have quit w/in 15years.) does not qualify.   Lung Cancer Screening Referral: n/a  Additional Screening:  Hepatitis C Screening: does qualify; Completed 09/30/19  Vision Screening: Recommended annual ophthalmology exams for early detection of glaucoma and other disorders of the eye. Is the patient up to date with their annual eye exam?  Yes  Who is the provider or what is the name of the office in which the patient attends  annual eye exams? Dr.Weaver If pt is not established with a provider, would they like to be referred to a provider to establish care? No .   Dental Screening: Recommended annual dental exams for proper oral hygiene  Community Resource Referral / Chronic Care Management: CRR required this visit?  No   CCM required this visit?  No      Plan:  I have personally reviewed and noted the following in the patient's chart:   Medical and social history Use of alcohol, tobacco or illicit drugs  Current medications and supplements including opioid prescriptions.  Functional ability and status Nutritional status Physical activity Advanced directives List of other physicians Hospitalizations, surgeries, and ER visits in previous 12 months Vitals Screenings to include cognitive, depression, and falls Referrals and appointments  In addition, I have reviewed and discussed with patient certain preventive protocols, quality metrics, and best practice recommendations. A written personalized care plan for preventive services as well as general preventive health recommendations were provided to patient.   Due to this being a telephonic visit, the after visit summary with patients personalized plan was offered to patient via mail or my-chart.  Patient would like to access on my-chart.    Duard Brady Vincenzina Jagoda, [place]   07/14/2021   Nurse Notes: none

## 2021-07-14 NOTE — Patient Instructions (Signed)
[name] , Thank you for taking time to come for your Medicare Wellness Visit. I appreciate your ongoing commitment to your health goals. Please review the following plan we discussed and let me know if I can assist you in the future.   Screening recommendations/referrals: Colonoscopy: no longer needed Mammogram: 04/22/21 due 04/23/22 Bone Density: 07/26/20 due 07/26/21 Recommended yearly ophthalmology/optometry visit for glaucoma screening and checkup Recommended yearly dental visit for hygiene and checkup  Vaccinations: Influenza vaccine: up to date Pneumococcal vaccine: up to date Tdap vaccine: up to date Shingles vaccine: up to date   Covid-19:completed  Advanced directives: yes, not on file  Conditions/risks identified: see problem list  Next appointment: Follow up in one year for your annual wellness visit    Preventive Care 78 Years and Older, Female Preventive care refers to lifestyle choices and visits with your health care provider that can promote health and wellness. What does preventive care include? A yearly physical exam. This is also called an annual well check. Dental exams once or twice a year. Routine eye exams. Ask your health care provider how often you should have your eyes checked. Personal lifestyle choices, including: Daily care of your teeth and gums. Regular physical activity. Eating a healthy diet. Avoiding tobacco and drug use. Limiting alcohol use. Practicing safe sex. Taking low-dose aspirin every day. Taking vitamin and mineral supplements as recommended by your health care provider. What happens during an annual well check? The services and screenings done by your health care provider during your annual well check will depend on your age, overall health, lifestyle risk factors, and family history of disease. Counseling  Your health care provider may ask you questions about your: Alcohol use. Tobacco use. Drug use. Emotional well-being. Home  and relationship well-being. Sexual activity. Eating habits. History of falls. Memory and ability to understand (cognition). Work and work Statistician. Reproductive health. Screening  You may have the following tests or measurements: Height, weight, and BMI. Blood pressure. Lipid and cholesterol levels. These may be checked every 5 years, or more frequently if you are over 59 years old. Skin check. Lung cancer screening. You may have this screening every year starting at age 42 if you have a 30-pack-year history of smoking and currently smoke or have quit within the past 15 years. Fecal occult blood test (FOBT) of the stool. You may have this test every year starting at age 71. Flexible sigmoidoscopy or colonoscopy. You may have a sigmoidoscopy every 5 years or a colonoscopy every 10 years starting at age 74. Hepatitis C blood test. Hepatitis B blood test. Sexually transmitted disease (STD) testing. Diabetes screening. This is done by checking your blood sugar (glucose) after you have not eaten for a while (fasting). You may have this done every 1-3 years. Bone density scan. This is done to screen for osteoporosis. You may have this done starting at age 8. Mammogram. This may be done every 1-2 years. Talk to your health care provider about how often you should have regular mammograms. Talk with your health care provider about your test results, treatment options, and if necessary, the need for more tests. Vaccines  Your health care provider may recommend certain vaccines, such as: Influenza vaccine. This is recommended every year. Tetanus, diphtheria, and acellular pertussis (Tdap, Td) vaccine. You may need a Td booster every 10 years. Zoster vaccine. You may need this after age 110. Pneumococcal 13-valent conjugate (PCV13) vaccine. One dose is recommended after age 77. Pneumococcal polysaccharide (PPSV23) vaccine.  One dose is recommended after age 88. Talk to your health care provider  about which screenings and vaccines you need and how often you need them. This information is not intended to replace advice given to you by your health care provider. Make sure you discuss any questions you have with your health care provider. Document Released: 02/12/2015 Document Revised: 10/06/2015 Document Reviewed: 11/17/2014 Elsevier Interactive Patient Education  2017 [place] Prevention in the Home Falls can cause injuries. They can happen to people of all ages. There are many things you can do to make your home safe and to help prevent falls. What can I do on the outside of my home? Regularly fix the edges of walkways and driveways and fix any cracks. Remove anything that might make you trip as you walk through a door, such as a raised step or threshold. Trim any bushes or trees on the path to your home. Use bright outdoor lighting. Clear any walking paths of anything that might make someone trip, such as rocks or tools. Regularly check to see if handrails are loose or broken. Make sure that both sides of any steps have handrails. Any raised decks and porches should have guardrails on the edges. Have any leaves, snow, or ice cleared regularly. Use sand or salt on walking paths during winter. Clean up any spills in your garage right away. This includes oil or grease spills. What can I do in the bathroom? Use night lights. Install grab bars by the toilet and in the tub and shower. Do not use towel bars as grab bars. Use non-skid mats or decals in the tub or shower. If you need to sit down in the shower, use a plastic, non-slip stool. Keep the floor dry. Clean up any water that spills on the floor as soon as it happens. Remove soap buildup in the tub or shower regularly. Attach bath mats securely with double-sided non-slip rug tape. Do not have throw rugs and other things on the floor that can make you trip. What can I do in the bedroom? Use night lights. Make sure  that you have a light by your bed that is easy to reach. Do not use any sheets or blankets that are too big for your bed. They should not hang down onto the floor. Have a firm chair that has side arms. You can use this for support while you get dressed. Do not have throw rugs and other things on the floor that can make you trip. What can I do in the kitchen? Clean up any spills right away. Avoid walking on wet floors. Keep items that you use a lot in easy-to-reach places. If you need to reach something above you, use a strong step stool that has a grab bar. Keep electrical cords out of the way. Do not use floor polish or wax that makes floors slippery. If you must use wax, use non-skid floor wax. Do not have throw rugs and other things on the floor that can make you trip. What can I do with my stairs? Do not leave any items on the stairs. Make sure that there are handrails on both sides of the stairs and use them. Fix handrails that are broken or loose. Make sure that handrails are as long as the stairways. Check any carpeting to make sure that it is firmly attached to the stairs. Fix any carpet that is loose or worn. Avoid having throw rugs at the top or bottom of  the stairs. If you do have throw rugs, attach them to the floor with carpet tape. Make sure that you have a light switch at the top of the stairs and the bottom of the stairs. If you do not have them, ask someone to add them for you. What else can I do to help prevent falls? Wear shoes that: Do not have high heels. Have rubber bottoms. Are comfortable and fit you well. Are closed at the toe. Do not wear sandals. If you use a stepladder: Make sure that it is fully opened. Do not climb a closed stepladder. Make sure that both sides of the stepladder are locked into place. Ask someone to hold it for you, if possible. Clearly mark and make sure that you can see: Any grab bars or handrails. First and last steps. Where the edge of  each step is. Use tools that help you move around (mobility aids) if they are needed. These include: Canes. Walkers. Scooters. Crutches. Turn on the lights when you go into a dark area. Replace any light bulbs as soon as they burn out. Set up your furniture so you have a clear path. Avoid moving your furniture around. If any of your floors are uneven, fix them. If there are any pets around you, be aware of where they are. Review your medicines with your doctor. Some medicines can make you feel dizzy. This can increase your chance of falling. Ask your doctor what other things that you can do to help prevent falls. This information is not intended to replace advice given to you by your health care provider. Make sure you discuss any questions you have with your health care provider. Document Released: 11/12/2008 Document Revised: 06/24/2015 Document Reviewed: 02/20/2014 Elsevier Interactive Patient Education  2017 [employer].

## 2021-07-15 ENCOUNTER — Ambulatory Visit
Admission: RE | Admit: 2021-07-15 | Discharge: 2021-07-15 | Disposition: A | Discharge status: Home / Self Care | Payer: No Typology Code available for payment source | Source: Ambulatory Visit | Attending: Interventional Radiology | Admitting: Interventional Radiology

## 2021-07-15 DIAGNOSIS — R911 Solitary pulmonary nodule: Secondary | ICD-10-CM

## 2021-07-15 NOTE — Progress Notes (Signed)
No answer on calls x2 at time of scheduled tele visit. Left message to call [facility] clinic main number back. We will try to reach patient. She will need f/u imaging in about 6 months.

## 2021-07-20 NOTE — Telephone Encounter (Signed)
Left Vm regarding vitamin D and repeat labs. Future labs ordered, instructed pt to call and make lab appointment for whenever she is available to come in.

## 2021-07-22 DIAGNOSIS — H25813 Combined forms of age-related cataract, bilateral: Secondary | ICD-10-CM | POA: Diagnosis not present

## 2021-07-22 DIAGNOSIS — H524 Presbyopia: Secondary | ICD-10-CM | POA: Diagnosis not present

## 2021-07-22 DIAGNOSIS — H04123 Dry eye syndrome of bilateral lacrimal glands: Secondary | ICD-10-CM | POA: Diagnosis not present

## 2021-07-22 DIAGNOSIS — H40013 Open angle with borderline findings, low risk, bilateral: Secondary | ICD-10-CM | POA: Diagnosis not present

## 2021-07-22 DIAGNOSIS — H18593 Other hereditary corneal dystrophies, bilateral: Secondary | ICD-10-CM | POA: Diagnosis not present

## 2021-07-22 LAB — HM DIABETES EYE EXAM

## 2021-07-30 DIAGNOSIS — H524 Presbyopia: Secondary | ICD-10-CM | POA: Diagnosis not present

## 2021-07-30 DIAGNOSIS — H5203 Hypermetropia, bilateral: Secondary | ICD-10-CM | POA: Diagnosis not present

## 2021-07-30 DIAGNOSIS — H52209 Unspecified astigmatism, unspecified eye: Secondary | ICD-10-CM | POA: Diagnosis not present

## 2021-08-10 ENCOUNTER — Other Ambulatory Visit (INDEPENDENT_AMBULATORY_CARE_PROVIDER_SITE_OTHER): Payer: No Typology Code available for payment source

## 2021-08-10 DIAGNOSIS — R748 Abnormal levels of other serum enzymes: Secondary | ICD-10-CM | POA: Diagnosis not present

## 2021-08-10 DIAGNOSIS — E785 Hyperlipidemia, unspecified: Secondary | ICD-10-CM | POA: Diagnosis not present

## 2021-08-10 DIAGNOSIS — R7989 Other specified abnormal findings of blood chemistry: Secondary | ICD-10-CM

## 2021-08-10 DIAGNOSIS — I10 Essential (primary) hypertension: Secondary | ICD-10-CM | POA: Diagnosis not present

## 2021-08-10 LAB — CBC
HCT: 35.7 % — ABNORMAL LOW (ref 36.0–46.0)
Hemoglobin: 11.6 g/dL — ABNORMAL LOW (ref 12.0–15.0)
MCHC: 32.4 g/dL (ref 30.0–36.0)
MCV: 103.3 fl — ABNORMAL HIGH (ref 78.0–100.0)
Platelets: 154 10*3/uL (ref 150.0–400.0)
RBC: 3.45 Mil/uL — ABNORMAL LOW (ref 3.87–5.11)
RDW: 16.4 % — ABNORMAL HIGH (ref 11.5–15.5)
WBC: 5.9 10*3/uL (ref 4.0–10.5)

## 2021-08-10 LAB — VITAMIN D 25 HYDROXY (VIT D DEFICIENCY, FRACTURES): VITD: 75.66 ng/mL (ref 30.00–100.00)

## 2021-08-10 LAB — COMPREHENSIVE METABOLIC PANEL
ALT: 26 U/L (ref 0–35)
AST: 31 U/L (ref 0–37)
Albumin: 4.1 g/dL (ref 3.5–5.2)
Alkaline Phosphatase: 54 U/L (ref 39–117)
BUN: 33 mg/dL — ABNORMAL HIGH (ref 6–23)
CO2: 25 mEq/L (ref 19–32)
Calcium: 10.5 mg/dL (ref 8.4–10.5)
Chloride: 104 mEq/L (ref 96–112)
Creatinine, Ser: 1.46 mg/dL — ABNORMAL HIGH (ref 0.40–1.20)
GFR: 34.32 mL/min — ABNORMAL LOW (ref >60.00)
Glucose, Bld: 208 mg/dL — ABNORMAL HIGH (ref 70–99)
Potassium: 4.6 mEq/L (ref 3.5–5.1)
Sodium: 137 mEq/L (ref 135–145)
Total Bilirubin: 0.8 mg/dL (ref 0.2–1.2)
Total Protein: 6.8 g/dL (ref 6.0–8.3)

## 2021-08-10 LAB — LDL CHOLESTEROL, DIRECT: Direct LDL: 71 mg/dL

## 2021-08-10 LAB — LIPID PANEL
Cholesterol: 206 mg/dL — ABNORMAL HIGH (ref 0–200)
HDL: 45.9 mg/dL (ref >39.00)
Total CHOL/HDL Ratio: 4
Triglycerides: 418 mg/dL — ABNORMAL HIGH (ref 0.0–149.0)

## 2021-08-10 LAB — TSH: TSH: 2.73 u[IU]/mL (ref 0.35–5.50)

## 2021-08-10 LAB — LIPASE: Lipase: 99 U/L — ABNORMAL HIGH (ref 11.0–59.0)

## 2021-08-11 ENCOUNTER — Other Ambulatory Visit: Payer: Self-pay | Admitting: Family Medicine

## 2021-08-11 ENCOUNTER — Other Ambulatory Visit: Payer: Self-pay

## 2021-08-11 ENCOUNTER — Other Ambulatory Visit (INDEPENDENT_AMBULATORY_CARE_PROVIDER_SITE_OTHER): Payer: No Typology Code available for payment source

## 2021-08-11 DIAGNOSIS — D539 Nutritional anemia, unspecified: Secondary | ICD-10-CM

## 2021-08-11 LAB — VITAMIN B12: Vitamin B-12: >1504 pg/mL — ABNORMAL HIGH (ref 211–911)

## 2021-08-22 ENCOUNTER — Other Ambulatory Visit: Payer: Self-pay | Admitting: Family Medicine

## 2021-08-24 ENCOUNTER — Telehealth: Payer: Self-pay | Admitting: Oncology

## 2021-08-24 NOTE — Telephone Encounter (Signed)
Called patient regarding upcoming October appointments, patient has been called and notified.

## 2021-11-02 ENCOUNTER — Telehealth: Payer: Self-pay | Admitting: Internal Medicine

## 2021-11-02 MED ORDER — IRBESARTAN 75 MG PO TABS: 75.0000 mg | ORAL_TABLET | Freq: Every day | ORAL | 1 refills | Status: DC

## 2021-11-02 NOTE — Telephone Encounter (Signed)
Pt c/o medication issue:  1. Name of Medication: irbesartan (AVAPRO) 75 MG tablet  2. How are you currently taking this medication (dosage and times per day)? TAKE 1 TABLET BY MOUTH EVERY DAY  3. Are you having a reaction (difficulty breathing--STAT)? no  4. What is your medication issue? Calling to see if this patient suppose to be taking this medication, because currently she is not taking the medication. Please advise

## 2021-11-02 NOTE — Telephone Encounter (Signed)
Reviewed chart, [facility]. [facility] spoke with Kristin Bruins she states that she found call from [place].  Left detailed message for pt to call to discuss medication and follow up appointment.

## 2021-11-07 IMAGING — CT CT CHEST W/O CM
2 of 4 series · 15 of 36 positions shown, 18 images · non-contrast
Comparison: June 03, 2020.

CLINICAL DATA: History of renal cancer with lung metastases, post
lung resection and chemotherapy, assess treatment response.

EXAM:
CT CHEST WITHOUT CONTRAST
TECHNIQUE: Multidetector CT imaging of the chest was performed following the
standard protocol without IV contrast.

[Series 2: thorax · axial · 0.74mm/px · z∈[-389,-145]mm · 12 of 146 slices shown, 15 images]
[im 12/146  mediastinal]
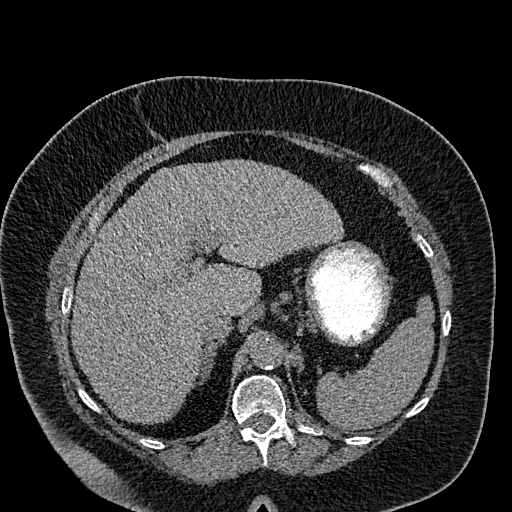
[im 12/146  lung]
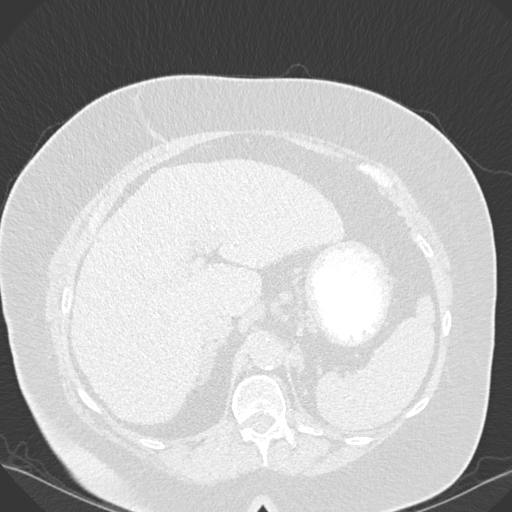
[im 23/146  lung]
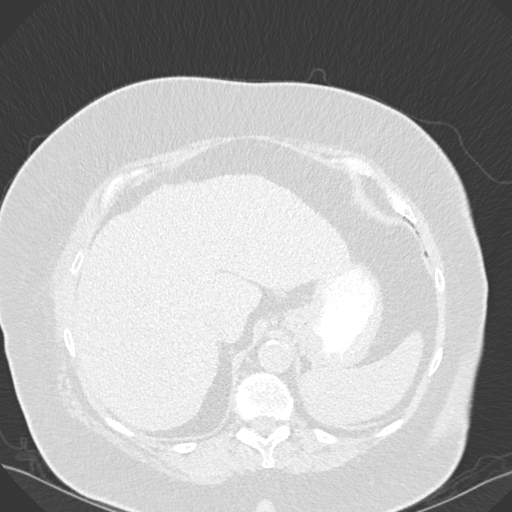
[im 34/146  lung]
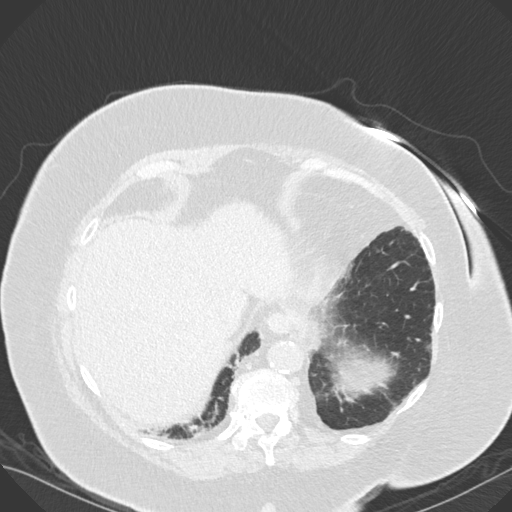
[im 45/146  lung]
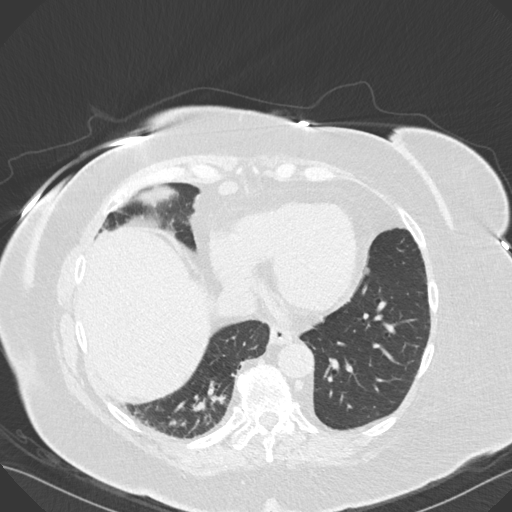
[im 56/146  mediastinal]
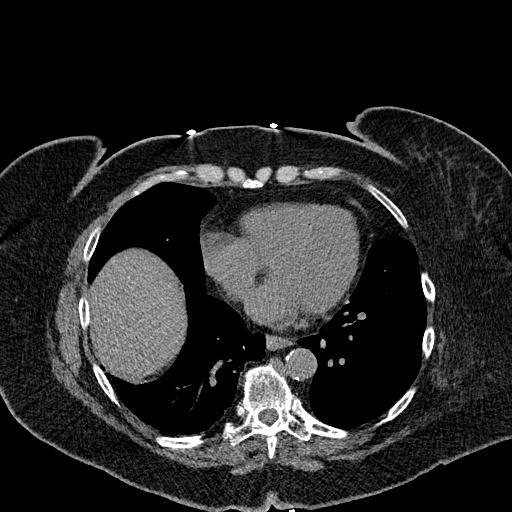
[im 56/146  lung]
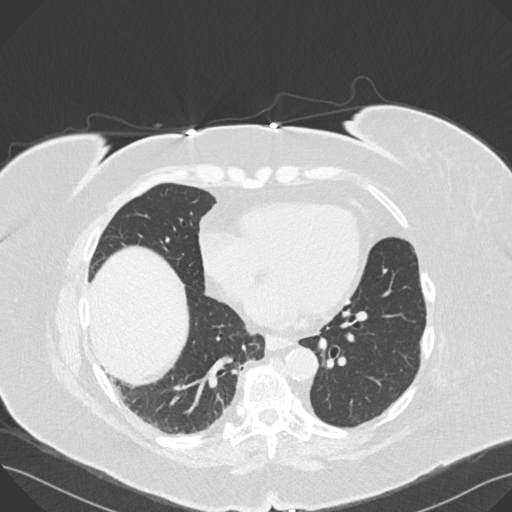
[im 67/146  lung]
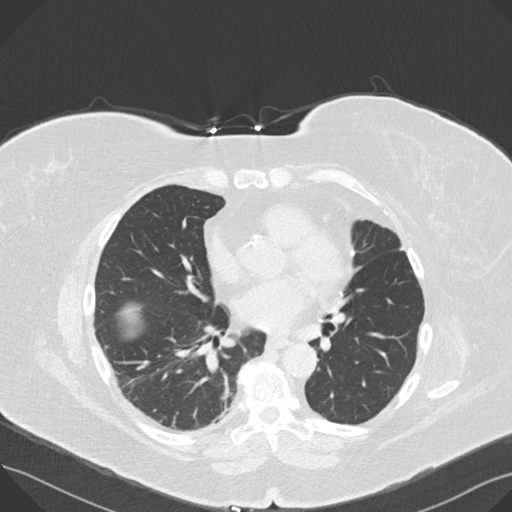
[im 79/146  lung]
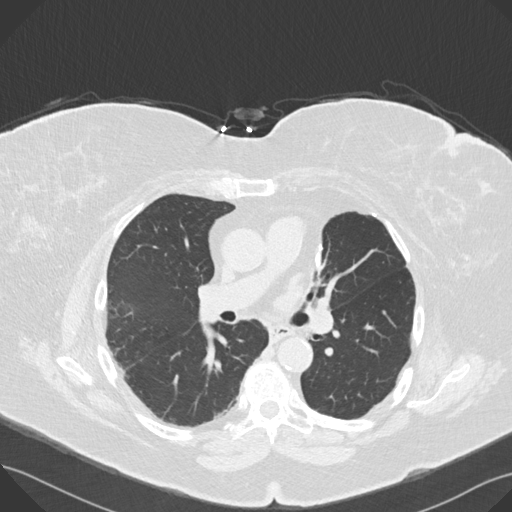
[im 90/146  lung]
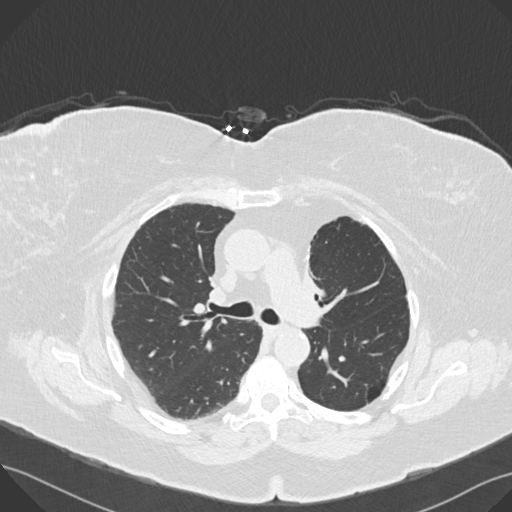
[im 101/146  mediastinal]
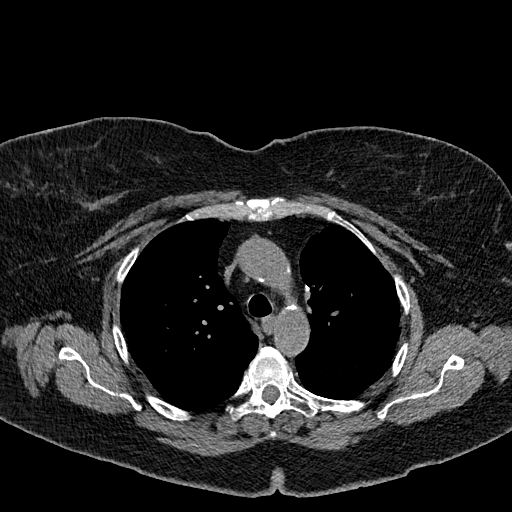
[im 101/146  lung]
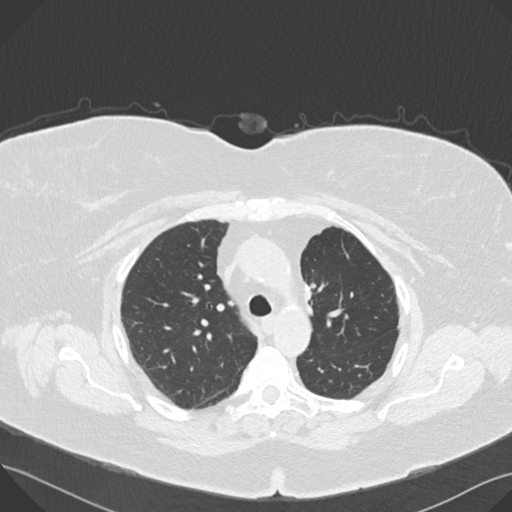
[im 112/146  lung]
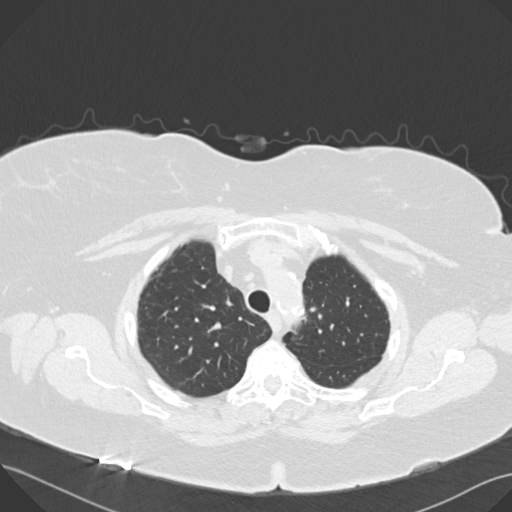
[im 123/146  lung]
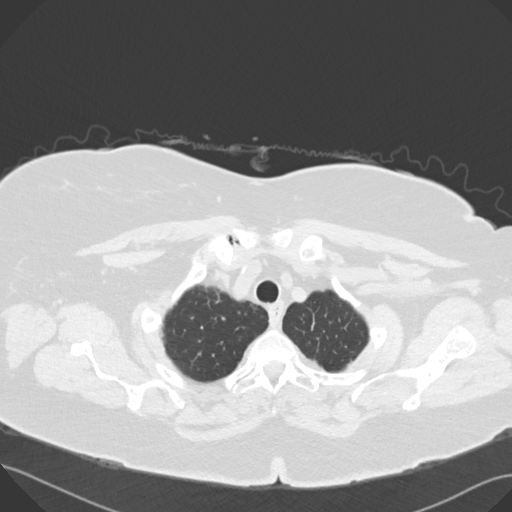
[im 134/146  lung]
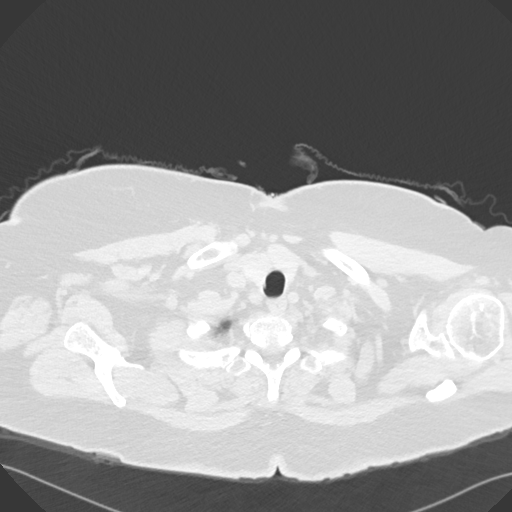

[Series 6: coronal · coronal · 0.59mm/px · 3 of 158 slices shown]
[im 32/158  lung]
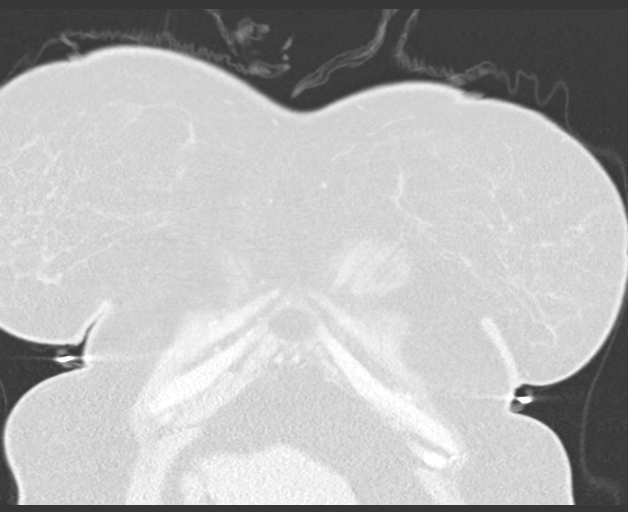
[im 63/158  lung]
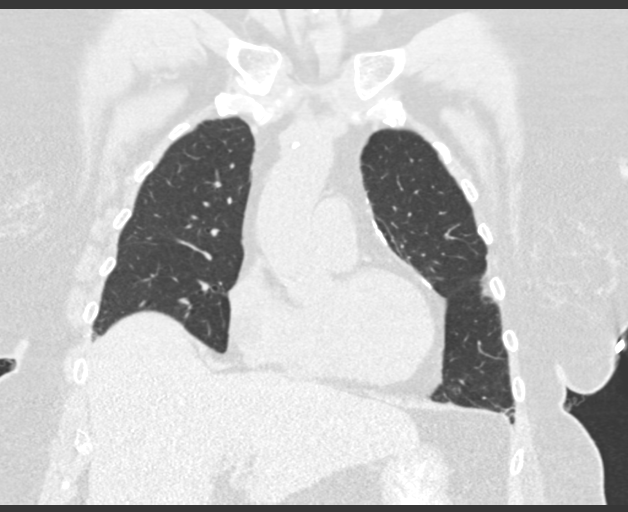
[im 95/158  lung]
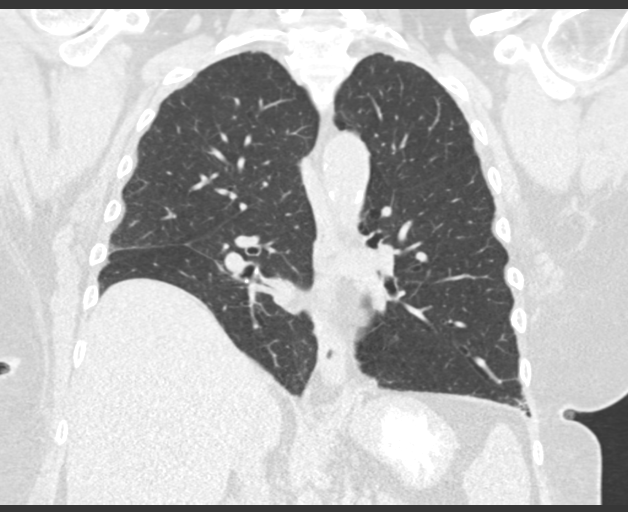

[15 of 36 positions shown; findings below may reference images not displayed]

FINDINGS: Cardiovascular: Atherosclerotic calcifications of the thoracic aorta
without aneurysmal dilation. Normal caliber central pulmonary
arteries. Coronary artery calcifications. Normal size heart. No
significant pericardial effusion/thickening.

Mediastinum/Nodes: No discrete thyroid nodule. No pathologically
enlarged mediastinal or axillary lymph nodes. Stable 9 mm right
juxta cardiac lymph node on image 101/2.

Lungs/Pleura: Decreased size of the cluster of several nodules in
the posterior right costophrenic sulcus now measuring up to 7 mm,
previously 11 mm on image 112/5. No new suspicious pulmonary nodules
or masses. Surgical change of wedge resection in the left upper
lobe. No pleural effusion. No pneumothorax.

Upper Abdomen: Cirrhotic hepatic morphology.  No acute abnormality.

Musculoskeletal: Thoracic spondylosis. No aggressive lytic or
blastic lesion of bone.
IMPRESSION: 1. Decreased size of the cluster of several nodules in the posterior
right costophrenic sulcus now measuring up to 7 mm, which are
favored infectious or inflammatory continued attention on follow-up
imaging suggested. No new suspicious pulmonary nodules or masses.
2. Cirrhotic hepatic morphology.
3.  Aortic Atherosclerosis ([id number]).

## 2021-11-08 ENCOUNTER — Other Ambulatory Visit: Payer: Self-pay

## 2021-11-08 ENCOUNTER — Telehealth: Payer: Self-pay | Admitting: Family Medicine

## 2021-11-08 MED ORDER — ATORVASTATIN CALCIUM 20 MG PO TABS: 20.0000 mg | ORAL_TABLET | Freq: Every day | ORAL | 1 refills | Status: DC

## 2021-11-08 NOTE — Telephone Encounter (Signed)
Medication was sent

## 2021-11-08 NOTE — Telephone Encounter (Signed)
Medication: atorvastatin (LIPITOR) 20 MG tablet  Has the patient contacted their pharmacy? No.   Preferred Pharmacy:   [id number] [place] [place] [address]Phone: [phone] Fax: [phone]

## 2021-11-09 NOTE — Telephone Encounter (Signed)
Refill sent to the pharmacy electronically.  

## 2021-11-16 ENCOUNTER — Telehealth: Payer: Self-pay

## 2021-11-16 NOTE — Telephone Encounter (Signed)
Hello I just received a call from [facility]. They stated that you have stopped your Irbesartan and I was just calling to discuss this with you. Also, I see you are due for a follow up in January. I have scheduled you follow up appointment with Dr Debara Pickett 02-23-2022 at 1045am. Please give [place] a call back to discuss medication issue and your follow up appointment.     Called pt, she states that she is taking her Irbesartan and it was just refilled. She states that the appt time is good for her. She will arrive early.

## 2021-11-19 ENCOUNTER — Other Ambulatory Visit: Payer: Self-pay | Admitting: Family Medicine

## 2021-11-21 ENCOUNTER — Telehealth: Payer: Self-pay | Admitting: Family Medicine

## 2021-11-21 ENCOUNTER — Other Ambulatory Visit: Payer: Self-pay

## 2021-11-21 ENCOUNTER — Other Ambulatory Visit: Payer: Self-pay | Admitting: Family Medicine

## 2021-11-21 MED ORDER — GLIMEPIRIDE 2 MG PO TABS: ORAL_TABLET | ORAL | 0 refills | Status: DC

## 2021-11-21 NOTE — Telephone Encounter (Signed)
Medication: glimepiride (AMARYL) 2 MG tablet [[id number]   Has the patient contacted their pharmacy? Yes.   Refill request sent but patient is totally out  Preferred Pharmacy (with phone number or street name): [id number] [place], [place] [address]Phone: [phone] Fax: [phone]  Agent: Please be advised that RX refills may take up to 3 business days. We ask that you follow-up with your pharmacy.

## 2021-11-21 NOTE — Telephone Encounter (Signed)
Medication was sent

## 2021-11-30 ENCOUNTER — Other Ambulatory Visit: Payer: No Typology Code available for payment source

## 2021-12-01 ENCOUNTER — Other Ambulatory Visit: Payer: Self-pay

## 2021-12-01 ENCOUNTER — Inpatient Hospital Stay: Payer: No Typology Code available for payment source | Attending: Oncology

## 2021-12-01 ENCOUNTER — Ambulatory Visit (HOSPITAL_COMMUNITY)
Admission: RE | Admit: 2021-12-01 | Discharge: 2021-12-01 | Disposition: A | Discharge status: Home / Self Care | Payer: No Typology Code available for payment source | Source: Ambulatory Visit | Attending: Oncology | Admitting: Oncology

## 2021-12-01 DIAGNOSIS — J439 Emphysema, unspecified: Secondary | ICD-10-CM | POA: Insufficient documentation

## 2021-12-01 DIAGNOSIS — I700 Atherosclerosis of aorta: Secondary | ICD-10-CM | POA: Diagnosis not present

## 2021-12-01 DIAGNOSIS — C642 Malignant neoplasm of left kidney, except renal pelvis: Secondary | ICD-10-CM | POA: Diagnosis not present

## 2021-12-01 DIAGNOSIS — R635 Abnormal weight gain: Secondary | ICD-10-CM | POA: Diagnosis not present

## 2021-12-01 DIAGNOSIS — C649 Malignant neoplasm of unspecified kidney, except renal pelvis: Secondary | ICD-10-CM | POA: Diagnosis not present

## 2021-12-01 DIAGNOSIS — K7460 Unspecified cirrhosis of liver: Secondary | ICD-10-CM | POA: Insufficient documentation

## 2021-12-01 DIAGNOSIS — R918 Other nonspecific abnormal finding of lung field: Secondary | ICD-10-CM | POA: Insufficient documentation

## 2021-12-01 DIAGNOSIS — D4989 Neoplasm of unspecified behavior of other specified sites: Secondary | ICD-10-CM | POA: Diagnosis not present

## 2021-12-01 DIAGNOSIS — K5730 Diverticulosis of large intestine without perforation or abscess without bleeding: Secondary | ICD-10-CM | POA: Insufficient documentation

## 2021-12-01 DIAGNOSIS — Z79899 Other long term (current) drug therapy: Secondary | ICD-10-CM | POA: Diagnosis not present

## 2021-12-01 DIAGNOSIS — Z905 Acquired absence of kidney: Secondary | ICD-10-CM | POA: Insufficient documentation

## 2021-12-01 LAB — CMP (CANCER CENTER ONLY)
ALT: 40 U/L (ref 0–44)
AST: 41 U/L (ref 15–41)
Albumin: 4.1 g/dL (ref 3.5–5.0)
Alkaline Phosphatase: 61 U/L (ref 38–126)
Anion gap: 5 (ref 5–15)
BUN: 35 mg/dL — ABNORMAL HIGH (ref 8–23)
CO2: 27 mmol/L (ref 22–32)
Calcium: 10.9 mg/dL — ABNORMAL HIGH (ref 8.9–10.3)
Chloride: 105 mmol/L (ref 98–111)
Creatinine: 1.41 mg/dL — ABNORMAL HIGH (ref 0.44–1.00)
GFR, Estimated: 38 mL/min — ABNORMAL LOW (ref >60)
Glucose, Bld: 170 mg/dL — ABNORMAL HIGH (ref 70–99)
Potassium: 5.3 mmol/L — ABNORMAL HIGH (ref 3.5–5.1)
Sodium: 137 mmol/L (ref 135–145)
Total Bilirubin: 1.1 mg/dL (ref 0.3–1.2)
Total Protein: 7.3 g/dL (ref 6.5–8.1)

## 2021-12-01 LAB — CBC WITH DIFFERENTIAL (CANCER CENTER ONLY)
Abs Immature Granulocytes: 0.03 10*3/uL (ref 0.00–0.07)
Basophils Absolute: 0 10*3/uL (ref 0.0–0.1)
Basophils Relative: 1 %
Eosinophils Absolute: 0.2 10*3/uL (ref 0.0–0.5)
Eosinophils Relative: 3 %
HCT: 38.7 % (ref 36.0–46.0)
Hemoglobin: 12.7 g/dL (ref 12.0–15.0)
Immature Granulocytes: 0 %
Lymphocytes Relative: 24 %
Lymphs Abs: 1.7 10*3/uL (ref 0.7–4.0)
MCH: 33.5 pg (ref 26.0–34.0)
MCHC: 32.8 g/dL (ref 30.0–36.0)
MCV: 102.1 fL — ABNORMAL HIGH (ref 80.0–100.0)
Monocytes Absolute: 0.5 10*3/uL (ref 0.1–1.0)
Monocytes Relative: 7 %
Neutro Abs: 4.5 10*3/uL (ref 1.7–7.7)
Neutrophils Relative %: 65 %
Platelet Count: 153 10*3/uL (ref 150–400)
RBC: 3.79 MIL/uL — ABNORMAL LOW (ref 3.87–5.11)
RDW: 14.6 % (ref 11.5–15.5)
WBC Count: 6.9 10*3/uL (ref 4.0–10.5)
nRBC: 0 % (ref 0.0–0.2)

## 2021-12-05 ENCOUNTER — Other Ambulatory Visit: Payer: Self-pay | Admitting: Interventional Radiology

## 2021-12-05 DIAGNOSIS — N2889 Other specified disorders of kidney and ureter: Secondary | ICD-10-CM

## 2021-12-07 ENCOUNTER — Other Ambulatory Visit: Payer: Self-pay

## 2021-12-07 ENCOUNTER — Inpatient Hospital Stay (HOSPITAL_BASED_OUTPATIENT_CLINIC_OR_DEPARTMENT_OTHER): Payer: No Typology Code available for payment source | Admitting: Oncology

## 2021-12-07 VITALS — BP 122/57 | HR 90 | Temp 97.7°F | Resp 18 | Wt 221.2 lb

## 2021-12-07 DIAGNOSIS — D4989 Neoplasm of unspecified behavior of other specified sites: Secondary | ICD-10-CM | POA: Diagnosis not present

## 2021-12-07 DIAGNOSIS — C642 Malignant neoplasm of left kidney, except renal pelvis: Secondary | ICD-10-CM | POA: Diagnosis not present

## 2021-12-07 NOTE — Progress Notes (Signed)
Hematology and Oncology Follow Up   SAPHIRE BARNHART [id number] 02-May-1945 76 y.o. 12/07/2021 10:24 AM [name], MDBlyth, [name], MD       Principle Diagnosis:  76 year old woman with left kidney cancer diagnosed in 2001.  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  He has not required any systemic treatment.     Secondary diagnosis: Right renal neoplasm diagnosed in July 2020.     Prior Therapy:  She is status post left nephrectomy in 2001.   She developed a left lingular mass, status post VATS procedure.  Pathology revealed a recurrent renal cell carcinoma resected in October 2008.    She developed relapse disease in 2014 and subsequently underwent a VATS and left upper lobe wedge resection of a tumor done on 05/09/2012. Pathology confirmed another relapse.  She is status post cryoablation of a right renal neoplasm on August 21, 2018.   Current therapy: Active surveillance.   Interim History: Mrs. Capili is here for a follow-up visit.  Since the last visit, she reports no major changes in her health.  She denies any chest pain or shortness of breath.  She denies any difficulty breathing.  Her performance status quality of life remains unchanged.  She has gained weight and does report some occasional dyspnea on exertion.  He denies any abdominal pain or distention.  She denies any hospitalizations or illnesses.      Medications: Reviewed without changes. Current Outpatient Medications  Medication Sig Dispense Refill   albuterol (VENTOLIN HFA) 108 (90 Base) MCG/ACT inhaler PLEASE SEE ATTACHED FOR DETAILED DIRECTIONS     allopurinol (ZYLOPRIM) 300 MG tablet TAKE 1 TABLET BY MOUTH EVERY DAY 90 tablet 1   amoxicillin-clavulanate (AUGMENTIN) 875-125 MG tablet Take 1 tablet by mouth 2 (two) times daily.     aspirin EC 81 MG tablet Take 81 mg by mouth  daily.     atorvastatin (LIPITOR) 20 MG tablet Take 1 tablet (20 mg total) by mouth daily. 90 tablet 1   Calcium Carb-Cholecalciferol (CALCIUM 600 + D PO) Take 1 tablet by mouth daily.     carvedilol (COREG) 6.25 MG tablet Take 1 tablet (6.25 mg total) by mouth 2 (two) times daily with a meal. 180 tablet 2   diphenhydramine-acetaminophen (TYLENOL PM) 25-500 MG TABS Take 1 tablet by mouth at bedtime as needed (sleep).      furosemide (LASIX) 40 MG tablet TAKE 1 TABLET BY MOUTH EVERY DAY 90 tablet 3   glimepiride (AMARYL) 2 MG tablet TAKE 1 TABLET BY MOUTH EVERY DAY WITH BREAKFAST 90 tablet 0   hyoscyamine (LEVSIN SL) 0.125 MG SL tablet Place 1 tablet (0.125 mg total) under the tongue every 6 (six) hours as needed. 30 tablet 1   insulin glargine (LANTUS SOLOSTAR) 100 UNIT/ML Solostar Pen Inject 10 Units into the skin daily. 15 mL PRN   Insulin Pen Needle (BD PEN NEEDLE NANO U/F) 32G X 4 MM MISC USE AS DIRECTED WITH LANTUS ONCE A DAY. 100 each 1   irbesartan (AVAPRO) 75 MG tablet Take 1 tablet (75 mg total) by mouth daily. 90 tablet 1   Lancets (ONETOUCH DELICA PLUS [id number]) MISC USE AS DIRECTED TO TEST DAILY 100 each 12   meclizine (ANTIVERT) 12.5 MG tablet Take 1 tablet (12.5 mg total) by mouth 3 (three) times daily as needed for dizziness. As needed for   Dizziness  or nausea 30 tablet 1   methocarbamol (ROBAXIN) 500 MG tablet Take 1 tablet (500 mg total) by mouth 4 (four) times daily. 45 tablet 1   nystatin cream (MYCOSTATIN) Apply 1 application topically 2 (two) times daily. 30 g 1   ondansetron (ZOFRAN-ODT) 4 MG disintegrating tablet Take by mouth.     ONETOUCH VERIO test strip CEHCK BLOOD SUGAR DAILY AS NEEDED 100 strip 12   predniSONE (STERAPRED UNI-PAK 21 TAB) 10 MG (21) TBPK tablet Take by mouth as directed.     Vitamin D, Ergocalciferol, (DRISDOL) 1.25 MG (50000 UNIT) CAPS capsule TAKE 1 CAPSULE (50,000 UNITS TOTAL) BY MOUTH EVERY 7 (SEVEN) DAYS 12 capsule 1   No current  facility-administered medications for this visit.   Physical exam: Blood pressure (!) 122/57, pulse 90, temperature 97.7 F (36.5 C), temperature source Temporal, resp. rate 18, weight 221 lb 3.2 oz (100.3 kg), SpO2 95 %.   ECOG 1    General appearance: Comfortable appearing without any discomfort Head: Normocephalic without any trauma Oropharynx: Mucous membranes are moist and pink without any thrush or ulcers. Eyes: Pupils are equal and round reactive to light. Lymph nodes: No cervical, supraclavicular, inguinal or axillary lymphadenopathy.   Heart:regular rate and rhythm.  S1 and S2 without leg edema. Lung: Clear without any rhonchi or wheezes.  No dullness to percussion. Abdomin: Soft, nontender, nondistended with good bowel sounds.  No hepatosplenomegaly. Musculoskeletal: No joint deformity or effusion.  Full range of motion noted. Neurological: No deficits noted on motor, sensory and deep tendon reflex exam. Skin: No petechial rash or dryness.  Appeared moist.       Lab Results: Lab Results  Component Value Date   WBC 6.9 12/01/2021   HGB 12.7 12/01/2021   HCT 38.7 12/01/2021   MCV 102.1 (H) 12/01/2021   PLT 153 12/01/2021     Chemistry      Component Value Date/Time   NA 137 12/01/2021 0954   NA 139 08/23/2017 0933   NA 140 11/01/2016 0818   K 5.3 (H) 12/01/2021 0954   K 4.4 11/01/2016 0818   CL 105 12/01/2021 0954   CL 107 04/16/2012 0923   CO2 27 12/01/2021 0954   CO2 26 11/01/2016 0818   BUN 35 (H) 12/01/2021 0954   BUN 24 08/23/2017 0933   BUN 17.9 11/01/2016 0818   CREATININE 1.41 (H) 12/01/2021 0954   CREATININE 1.1 11/01/2016 0818   GLU 151 (H) 09/30/2019 1014      Component Value Date/Time   CALCIUM 10.9 (H) 12/01/2021 0954   CALCIUM 10.3 11/01/2016 0818   ALKPHOS 61 12/01/2021 0954   ALKPHOS 72 11/01/2016 0818   AST 41 12/01/2021 0954   AST 75 (H) 11/01/2016 0818   ALT 40 12/01/2021 0954   ALT 75 (H) 11/01/2016 0818   BILITOT 1.1  12/01/2021 0954   BILITOT 0.78 11/01/2016 0818      Study Result  Narrative & Impression  CLINICAL DATA:  Stage IV clear cell renal cell carcinoma with history of left nephrectomy in 2001 with resected left pulmonary metastases in 2008 and 2014. Cryoablation of right renal neoplasm 08/21/2018. Active surveillance.   * Tracking Code: BO *   EXAM: CT CHEST, ABDOMEN AND PELVIS WITHOUT CONTRAST   TECHNIQUE: Multidetector CT imaging of the chest, abdomen and pelvis was performed following the standard protocol without IV contrast.   RADIATION DOSE REDUCTION: This exam was performed according to the departmental dose-optimization program which includes automated exposure control, adjustment  of the mA and/or kV according to patient size and/or use of iterative reconstruction technique.   COMPARISON:  03/24/2021 CT chest, abdomen and pelvis. 04/14/2021 MRI abdomen.   FINDINGS: CT CHEST FINDINGS   Cardiovascular: Normal heart size. No significant pericardial effusion/thickening. Left anterior descending and left circumflex coronary atherosclerosis. Atherosclerotic nonaneurysmal thoracic aorta. Normal caliber pulmonary arteries.   Mediastinum/Nodes: No significant thyroid nodules. Unremarkable esophagus. No pathologically enlarged axillary, mediastinal or hilar lymph nodes, noting limited sensitivity for the detection of hilar adenopathy on this noncontrast study.   Lungs/Pleura: No pneumothorax. No pleural effusion. Mild paraseptal emphysema. No acute consolidative airspace disease or lung masses. Status post left upper lobe wedge resections. Clustered indistinct centrilobular solid posterior basilar right lower lobe pulmonary nodules, largest 0.4 cm (series 505/image 112), not appreciably changed back to 09/15/2020 chest CT and associated with mild cylindrical bronchiolectasis. No new significant pulmonary nodules.   Musculoskeletal: No aggressive appearing focal osseous  lesions. Moderate thoracic spondylosis.   CT ABDOMEN PELVIS FINDINGS   Hepatobiliary: Diffusely irregular liver surface, compatible with cirrhosis. No liver masses. Cholecystectomy. Bile ducts are stable and within normal post cholecystectomy limits with CBD diameter 8 mm.   Pancreas: Normal, with no mass or duct dilation.   Spleen: Normal size. No mass.   Adrenals/Urinary Tract: No discrete adrenal nodules. Status post left nephrectomy with no recurrent mass or fluid collection in the left nephrectomy bed. Stable ablation defect in the lateral lower right kidney. No contour deforming right renal mass. No right hydronephrosis. No right renal stones. Normal caliber right ureter. Normal bladder.   Stomach/Bowel: Normal non-distended stomach. Normal caliber small bowel with no small bowel wall thickening. Appendectomy. Moderate diffuse colonic diverticulosis, most prominent in the sigmoid colon, with no new large bowel wall thickening or acute pericolonic fat stranding.   Vascular/Lymphatic: Atherosclerotic nonaneurysmal abdominal aorta. Chronic mild gastroesophageal varices. No pathologically enlarged lymph nodes in the abdomen or pelvis.   Reproductive: Status post hysterectomy, with no abnormal findings at the vaginal cuff. No adnexal mass.   Other: No pneumoperitoneum, ascites or focal fluid collection.   Musculoskeletal: No aggressive appearing focal osseous lesions. Moderate to marked lumbar degenerative disc disease.   IMPRESSION: 1. No evidence of metastatic disease in the chest, abdomen or pelvis. 2. Clustered small centrilobular solid posterior basilar right lower lobe pulmonary nodules, not appreciably changed back to 09/15/2020 chest CT and associated with mild cylindrical bronchiolectasis, probably benign infectious nodularity. 3. Cirrhosis. 4. Moderate diffuse colonic diverticulosis. 5. Aortic Atherosclerosis (ICD10-I70.0) and Emphysema (ICD10-J43.9).       Impression and Plan:  76 year old woman with:       1.  Left kidney cancer diagnosed in 2001.  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   She continues to be on active surveillance without any evidence of relapse.  CT scan obtained on December 01, 2021 was personally reviewed and continues to show no evidence of malignancy.  Management options at this time were discussed and I recommended annual imaging.  Local therapy for any recurrent disease could be considered.  Systemic therapy will be deferred if she has more advanced widespread disease.   2.    Right kidney tumor: She continues to be on active surveillance after ablative therapy.  She will be scheduled on MRI and follow-up with interventional radiology.   3.  Follow-up: She will return in 1 year for a follow-up.     Orient  minutes were dedicated to this visit.  The time was spent on reviewing laboratory data, imaging studies, treatment choices and future plan of care discussion.   Zola Button, MD 12/07/2021 10:24 AM

## 2021-12-30 ENCOUNTER — Other Ambulatory Visit: Payer: No Typology Code available for payment source

## 2021-12-30 ENCOUNTER — Ambulatory Visit (HOSPITAL_COMMUNITY)
Admission: RE | Admit: 2021-12-30 | Discharge: 2021-12-30 | Disposition: A | Discharge status: Home / Self Care | Payer: No Typology Code available for payment source | Source: Ambulatory Visit | Attending: Interventional Radiology | Admitting: Interventional Radiology

## 2021-12-30 DIAGNOSIS — N2889 Other specified disorders of kidney and ureter: Secondary | ICD-10-CM | POA: Diagnosis not present

## 2021-12-30 DIAGNOSIS — R160 Hepatomegaly, not elsewhere classified: Secondary | ICD-10-CM | POA: Diagnosis not present

## 2021-12-30 DIAGNOSIS — C7800 Secondary malignant neoplasm of unspecified lung: Secondary | ICD-10-CM | POA: Diagnosis not present

## 2021-12-30 DIAGNOSIS — K838 Other specified diseases of biliary tract: Secondary | ICD-10-CM | POA: Diagnosis not present

## 2021-12-30 DIAGNOSIS — C649 Malignant neoplasm of unspecified kidney, except renal pelvis: Secondary | ICD-10-CM | POA: Diagnosis not present

## 2021-12-30 MED ORDER — GADOBUTROL 1 MMOL/ML IV SOLN
10.0000 mL | Freq: Once | INTRAVENOUS | Status: AC | PRN
  Administered 2021-12-30: 10 mL via INTRAVENOUS

## 2022-01-04 ENCOUNTER — Ambulatory Visit
Admission: RE | Admit: 2022-01-04 | Discharge: 2022-01-04 | Disposition: A | Discharge status: Home / Self Care | Payer: No Typology Code available for payment source | Source: Ambulatory Visit | Attending: Interventional Radiology | Admitting: Interventional Radiology

## 2022-01-04 DIAGNOSIS — Z9889 Other specified postprocedural states: Secondary | ICD-10-CM | POA: Diagnosis not present

## 2022-01-04 DIAGNOSIS — N2889 Other specified disorders of kidney and ureter: Secondary | ICD-10-CM | POA: Diagnosis not present

## 2022-01-04 NOTE — Progress Notes (Signed)
Chief Complaint: Patient was consulted remotely today (TeleHealth) for renal neoplasm post ablation  at the request of [name].    Referring Physician(s): Zola Button MD  History of Present Illness: Amanda Crawford is a 76 y.o. female  history of 2001 left nephrectomy for renal cell carcinoma   by [name], with concurrent cholecystectomy by [name].  2014  Solitary lung metastases x2 were resected from the left lung by Drs.[name] and [name], On subsequent surveillance imaging, a new right lower pole enhancing renal mass was identified concerning for renal cell carcinoma.  No regional adenopathy.  No evidence of renal vein involvement. 05/24/2018 CT demonstrates slight increase in size of 1.4 cm hypervascular right renal mass   08/21/2018 technically successful CT-guided cryoablation of renal lesion without immediate complication.  Patient was discharged home the same day.  11/20/2018 CT showed ablation defect, no residual enhancing lesion 06/05/2019 CT shows embolization of ablation defect, no evidence of residual or recurrent lesion; no new metastatic disease, stable right cardiophrenic adenopathy   12/04/2019 CT Shows no residual/recurrent tissue and ablation site. 3 mm nonspecific right lower lobe pulmonary nodule described. No new adenopathy or other suggestion of metastatic disease.   06/04/2020 CT demonstrated no residual recurrent tissue at the ablation site.  Cluster of nodules in the posterior right costophrenic sulcus up to 11 mm  09/15/2020 she had follow-up CT chest showing Decreased size of the cluster of several nodules in the posterior right costophrenic sulcus now measuring up to 7 mm, which are favored infectious or inflammatory 03/24/2021 CT chest/abd/pelvis revealed Interval improvement and near complete resolution of previously described clustered nodules within the posterior right costophrenic sulcus suggestive of resolving infectious/inflammatory process;  status post left nephrectomy without evidence for localized recurrence; Stable 2 cm left lateral omental nodule.  04/14/2021 MR abdomen redemonstrates Previous left nephrectomy and partial right nephrectomy at the lower pole. No suspicious enhancement visualized.  12/01/21 CT c/a/p No evidence of recurrent or metastatic disease in the chest, abdomen or Pelvis;  Clustered small centrilobular solid posterior basilar right lower lobe pulmonary nodules, not appreciably changed, associated with mild cylindrical bronchiolectasis, probably benign infectious nodularity.  12/30/21 MRI abdomen reports Status post cryoablation of the inferior pole of the right kidney, Unchanged appearance of the ablation site without evidence of recurrent soft tissue or suspicious contrast enhancement;  Status post left nephrectomy, no evidence of locally recurrent soft tissue or suspicious contrast enhancement;  No evidence of lymphadenopathy or metastatic disease in the abdomen.   She is doing well.  No pain at the treatment site.  No hematuria. Some urinary incontinence. Her husband took a tumble down staris recently and is recovering.   As always she seems in very good spirits.  Past Medical History:  Diagnosis Date   Allergic state 03/12/2015   Arthritis    Colon polyp 11/19/2014   Dermatitis 03/12/2015   Diarrhea 06/29/2016   Diverticulitis    DM (diabetes mellitus), type 2 ([place]) 06/29/2016   GERD (gastroesophageal reflux disease)    occ   Gout 03/12/2015   H/O measles    H/O mumps    Headache(784.0)    migraines   History of chicken pox 11/23/2014   Hyperglycemia 06/29/2016   Lung cancer ([place])    Migraine 11/23/2014   Obesity 11/23/2014   Pneumonia    child   Preventative health care 09/12/2015   Primary hypertension 12/15/2020   Renal cell cancer ([name])    renal cell ca dx 9/01  and 8/08;   Renal insufficiency    Shortness of breath    occ   Skin lesion of breast 03/12/2015    Past Surgical History:   Procedure Laterality Date   ABDOMINAL HYSTERECTOMY     ANAL RECTAL MANOMETRY N/A 06/29/2021   Procedure: ANO RECTAL MANOMETRY;  Surgeon: Lavena Bullion, DO;  Location: [facility];  Service: Gastroenterology;  Laterality: N/A;   APPENDECTOMY     CARDIAC CATHETERIZATION     yrs ago neg   CHOLECYSTECTOMY     COLONOSCOPY  2018   ESOPHAGOGASTRODUODENOSCOPY     years ago   IR RADIOLOGIST EVAL & MGMT  07/17/2018   IR RADIOLOGIST EVAL & MGMT  09/12/2018   IR RADIOLOGIST EVAL & MGMT  06/18/2019   IR RADIOLOGIST EVAL & MGMT  01/13/2020   IR RADIOLOGIST EVAL & MGMT  07/08/2020   IR RADIOLOGIST EVAL & MGMT  10/12/2020   KIDNEY SURGERY  2001   Removed of left kidney    LUNG CANCER SURGERY Left 08   RADIOLOGY WITH ANESTHESIA N/A 08/21/2018   Procedure: CT WITH ANESTHESIA RENAL CRYOABLATION;  Surgeon: Arne Cleveland, MD;  Location: [facility];  Service: Radiology;  Laterality: N/A;   RENAL MASS EXCISION Left 01   RIGHT/LEFT HEART CATH AND CORONARY ANGIOGRAPHY N/A 07/27/2016   Procedure: Right/Left Heart Cath and Coronary Angiography;  Surgeon: Leonie Man, MD;  Location: [facility];  Service: Cardiovascular;  Laterality: N/A;   THORACOTOMY Left 05/09/2012   Procedure: THORACOTOMY MAJOR;  Surgeon: Gaye Pollack, MD;  Location: [facility];  Service: Thoracic;  Laterality: Left;   VIDEO BRONCHOSCOPY N/A 05/09/2012   Procedure: VIDEO BRONCHOSCOPY;  Surgeon: Gaye Pollack, MD;  Location: [facility];  Service: Thoracic;  Laterality: N/A;   WEDGE RESECTION Left 05/09/2012   Procedure: LEFT UPPER LOBE WEDGE RESECTION;  Surgeon: Gaye Pollack, MD;  Location: [facility];  Service: Thoracic;  Laterality: Left;    Allergies: Patient has no known allergies.  Medications: Prior to Admission medications   Medication Sig Start Date End Date Taking? Authorizing Provider  albuterol (VENTOLIN HFA) 108 (90 Base) MCG/ACT inhaler PLEASE SEE ATTACHED FOR DETAILED DIRECTIONS 12/27/20   [provider]   allopurinol (ZYLOPRIM) 300 MG tablet TAKE 1 TABLET BY MOUTH EVERY DAY 08/22/21   [name], MD  amoxicillin-clavulanate (AUGMENTIN) 875-125 MG tablet Take 1 tablet by mouth 2 (two) times daily. 05/08/21   [provider]  aspirin EC 81 MG tablet Take 81 mg by mouth daily.    [provider]  atorvastatin (LIPITOR) 20 MG tablet Take 1 tablet (20 mg total) by mouth daily. 11/08/21   [name], MD  Calcium Carb-Cholecalciferol (CALCIUM 600 + D PO) Take 1 tablet by mouth daily.    [provider]  carvedilol (COREG) 6.25 MG tablet Take 1 tablet (6.25 mg total) by mouth 2 (two) times daily with a meal. 05/23/21   Hilty, Nadean Corwin, MD  diphenhydramine-acetaminophen (TYLENOL PM) 25-500 MG TABS Take 1 tablet by mouth at bedtime as needed (sleep).     [provider]  furosemide (LASIX) 40 MG tablet TAKE 1 TABLET BY MOUTH EVERY DAY 05/06/21   Hilty, Nadean Corwin, MD  glimepiride (AMARYL) 2 MG tablet TAKE 1 TABLET BY MOUTH EVERY DAY WITH BREAKFAST 11/21/21   [name], MD  hyoscyamine (LEVSIN SL) 0.125 MG SL tablet Place 1 tablet (0.125 mg total) under the tongue every 6 (six) hours as  needed. 10/14/20   [name], MD  insulin glargine (LANTUS SOLOSTAR) 100 UNIT/ML Solostar Pen Inject 10 Units into the skin daily. 05/13/21   [name], MD  Insulin Pen Needle (BD PEN NEEDLE NANO U/F) 32G X 4 MM MISC USE AS DIRECTED WITH LANTUS ONCE A DAY. 11/21/21   [name], MD  irbesartan (AVAPRO) 75 MG tablet Take 1 tablet (75 mg total) by mouth daily. 11/02/21   Hilty, Nadean Corwin, MD  Lancets (ONETOUCH DELICA PLUS [id number]) MISC USE AS DIRECTED TO TEST DAILY 08/11/21   [name], MD  meclizine (ANTIVERT) 12.5 MG tablet Take 1 tablet (12.5 mg total) by mouth 3 (three) times daily as needed for dizziness. As needed for   Dizziness or nausea 11/23/14   [name], MD  methocarbamol (ROBAXIN) 500 MG tablet Take 1 tablet (500 mg total) by mouth 4 (four)  times daily. 09/09/20   Ann Held, DO  nystatin cream (MYCOSTATIN) Apply 1 application topically 2 (two) times daily. 10/18/20   [name], MD  ondansetron (ZOFRAN-ODT) 4 MG disintegrating tablet Take by mouth. 12/06/20   [provider]  ONETOUCH VERIO test strip [facility] BLOOD SUGAR DAILY AS NEEDED 05/09/21   [name], MD  predniSONE (STERAPRED UNI-PAK 21 TAB) 10 MG (21) TBPK tablet Take by mouth as directed. 05/08/21   [provider]  Vitamin D, Ergocalciferol, (DRISDOL) 1.25 MG (50000 UNIT) CAPS capsule TAKE 1 CAPSULE (50,000 UNITS TOTAL) BY MOUTH EVERY 7 (SEVEN) DAYS 11/22/20   [name], MD  glyBURIDE-metformin [facility]) 1.25-250 MG tablet Take 1 tablet by mouth 2 (two) times daily with a meal. 03/25/19 03/25/19  [name], MD     Family History  Problem Relation Age of Onset   Dementia Mother    Heart failure Father    Diabetes Father    Hyperlipidemia Father    Heart disease Father    Hypertension Father    Asthma Brother    Asthma Daughter    Alcohol abuse Maternal Aunt    Colon cancer Paternal Aunt    Cancer Paternal Grandmother    Hearing loss Paternal Grandfather    Stroke Paternal Grandfather     Social History   Socioeconomic History   Marital status: Married    Spouse name: Not on file   Number of children: Not on file   Years of education: Not on file   Highest education level: Not on file  Occupational History   Occupation: retired  Tobacco Use   Smoking status: Former    Packs/day: 0.50    Years: 35.00    Total pack years: 17.50    Types: Cigarettes    Quit date: 05/08/2006    Years since quitting: 15.6   Smokeless tobacco: Never  Vaping Use   Vaping Use: Never used  Substance and Sexual Activity   Alcohol use: Yes    Comment: seldomly   Drug use: No   Sexual activity: Not on file    Comment: lives with husband, no dietary restrictions.   Other Topics Concern   Not on file  Social History Narrative    Not on file   Social Determinants of Health   Financial Resource Strain: Low Risk  (07/05/2020)   Overall Financial Resource Strain (CARDIA)    Difficulty of Paying Living Expenses: Not hard at all  Food Insecurity: No Food Insecurity (07/05/2020)   Hunger Vital Sign    Worried  About Running Out of Food in the Last Year: Never true    Ran Out of Food in the Last Year: Never true  Transportation Needs: No Transportation Needs (07/05/2020)   PRAPARE - Hydrologist (Medical): No    Lack of Transportation (Non-Medical): No  Physical Activity: Inactive (07/05/2020)   Exercise Vital Sign    Days of Exercise per Week: 0 days    Minutes of Exercise per Session: 0 min  Stress: No Stress Concern Present (07/05/2020)   [place]    Feeling of Stress : Only a little  Social Connections: Moderately Integrated (07/05/2020)   Social Connection and Isolation Panel [NHANES]    Frequency of Communication with Friends and Family: More than three times a week    Frequency of Social Gatherings with Friends and Family: Once a week    Attends Religious Services: More than 4 times per year    Active Member of [employer] or Organizations: No    Attends Archivist Meetings: Never    Marital Status: Married    ECOG Status: 0 - Asymptomatic  Review of Systems  Review of Systems: A 12 point ROS discussed and pertinent positives are indicated in the HPI above.  All other systems are negative.      Physical Exam No direct physical exam was performed (except for noted visual exam findings with Video Visits).     Vital Signs: There were no vitals taken for this visit.  Imaging: MR ABDOMEN WWO CONTRAST  Result Date: 12/30/2021 CLINICAL DATA:  Follow-up renal cell carcinoma, status post left nephrectomy and right cryoablation, pulmonary metastases EXAM: MRI ABDOMEN WITHOUT AND WITH CONTRAST TECHNIQUE:  Multiplanar multisequence MR imaging of the abdomen was performed both before and after the administration of intravenous contrast. CONTRAST:  4m GADAVIST GADOBUTROL 1 MMOL/ML IV SOLN COMPARISON:  CT abdomen pelvis, 12/01/2021 FINDINGS: Lower chest: No acute abnormality. Hepatobiliary: No focal liver abnormality is seen. Somewhat coarse contour of the liver with heterogeneous parenchymal enhancement and mild hypertrophy of the left lobe. Status post cholecystectomy. Mild postoperative biliary dilatation, common bile duct measuring up to 0.9 cm. Pancreas: Unremarkable. No pancreatic ductal dilatation or surrounding inflammatory changes. Spleen: Normal in size without significant abnormality. Adrenals/Urinary Tract: Adrenal glands are unremarkable. Status post left nephrectomy. Status post cryoablation of the inferior pole of the right kidney. Unchanged appearance of the ablation site without evidence of recurrent soft tissue or suspicious contrast enhancement. Stomach/Bowel: Stomach is within normal limits. No evidence of bowel wall thickening, distention, or inflammatory changes. Vascular/Lymphatic: No significant vascular findings are present. No enlarged abdominal lymph nodes. Other: No abdominal wall hernia or abnormality. No ascites. Musculoskeletal: No acute or significant osseous findings. IMPRESSION: 1. Status post cryoablation of the inferior pole of the right kidney. Unchanged appearance of the ablation site without evidence of recurrent soft tissue or suspicious contrast enhancement. 2. Status post left nephrectomy. No evidence of locally recurrent soft tissue or suspicious contrast enhancement. 3. No evidence of lymphadenopathy or metastatic disease in the abdomen. 4. Somewhat coarse contour of the liver with heterogeneous parenchymal enhancement and mild hypertrophy of the left lobe, suggestive of cirrhosis. No focal liver lesion. 5. Status post cholecystectomy. Electronically Signed   By: ADelanna AhmadiM.D.   On: 12/30/2021 16:07    Labs:  CBC: Recent Labs    03/03/21 1157 03/24/21 1044 08/10/21 1008 12/01/21 0954  WBC 7.3 8.5 5.9 6.9  HGB 12.0 12.2 11.6* 12.7  HCT 36.6 37.8 35.7* 38.7  PLT 176.0 187 154.0 153    COAGS: Recent Labs    03/22/21 1150  INR 1.1*    BMP: Recent Labs    03/24/21 1044 05/12/21 1102 08/10/21 1008 12/01/21 0954  NA 136 136 137 137  K 4.9 5.2 No hemolysis seen* 4.6 5.3*  CL 103 103 104 105  CO2 27 25 25 27   GLUCOSE 121* 61* 208* 170*  BUN 39* 45* 33* 35*  CALCIUM 11.3* 10.2 10.5 10.9*  CREATININE 1.48* 1.62* 1.46* 1.41*  GFRNONAA 36*  --   --  38*    LIVER FUNCTION TESTS: Recent Labs    03/24/21 1044 05/12/21 1102 08/10/21 1008 12/01/21 0954  BILITOT 0.9 0.8 0.8 1.1  AST 34 37 31 41  ALT 24 40* 26 40  ALKPHOS 58 56 54 61  PROT 7.4 7.0 6.8 7.3  ALBUMIN 4.1 4.2 4.1 4.1    TUMOR MARKERS: No results for input(s): "AFPTM", "CEA", "CA199", "CHROMGRNA" in the last 8760 hours.  Assessment and Plan:  My impression is that she is doing very well post ablation of her renal neoplasm.  No local recurrence nor any metastatic disease.  Will continue CT surveillance of the left renal ablation site until total of 5 years post ablation.  Next scan in 12 months, unless she gets an interval CT abdomen without and with contrast in the interval at which point we will review and base timing off off of that . She understands and agrees.  I did encourage discussion with urology as incontinence has become bothersome.    Thank you for this interesting consult.  I greatly enjoyed meeting DARRIELLE PFLIEGER and look forward to participating in their care.  A copy of this report was sent to the requesting provider on this date.  Electronically Signed: Rickard Rhymes 01/04/2022, 1:51 PM   I spent a total of    25 Minutes in remote  clinical consultation, greater than 50% of which was counseling/coordinating care for left renal neoplasm  post ablation.    Visit type: Audio only (telephone). Audio (no video) only due to patient's lack of internet/smartphone capability. Alternative for in-person consultation at [facility], [place]  [address], [place], [place]. This visit type was conducted due to national recommendations for restrictions regarding the COVID-19 Pandemic (e.g. social distancing).  This format is felt to be most appropriate for this patient at this time.  All issues noted in this document were discussed and addressed.

## 2022-01-19 DIAGNOSIS — J439 Emphysema, unspecified: Secondary | ICD-10-CM | POA: Diagnosis not present

## 2022-01-19 DIAGNOSIS — E261 Secondary hyperaldosteronism: Secondary | ICD-10-CM | POA: Diagnosis not present

## 2022-01-19 DIAGNOSIS — I77819 Aortic ectasia, unspecified site: Secondary | ICD-10-CM | POA: Diagnosis not present

## 2022-01-19 DIAGNOSIS — Z6836 Body mass index (BMI) 36.0-36.9, adult: Secondary | ICD-10-CM | POA: Diagnosis not present

## 2022-01-19 DIAGNOSIS — Z794 Long term (current) use of insulin: Secondary | ICD-10-CM | POA: Diagnosis not present

## 2022-01-19 DIAGNOSIS — I110 Hypertensive heart disease with heart failure: Secondary | ICD-10-CM | POA: Diagnosis not present

## 2022-01-19 DIAGNOSIS — Z008 Encounter for other general examination: Secondary | ICD-10-CM | POA: Diagnosis not present

## 2022-01-19 DIAGNOSIS — E1169 Type 2 diabetes mellitus with other specified complication: Secondary | ICD-10-CM | POA: Diagnosis not present

## 2022-01-19 DIAGNOSIS — I509 Heart failure, unspecified: Secondary | ICD-10-CM | POA: Diagnosis not present

## 2022-01-19 DIAGNOSIS — R32 Unspecified urinary incontinence: Secondary | ICD-10-CM | POA: Diagnosis not present

## 2022-01-19 DIAGNOSIS — E785 Hyperlipidemia, unspecified: Secondary | ICD-10-CM | POA: Diagnosis not present

## 2022-01-19 DIAGNOSIS — F17211 Nicotine dependence, cigarettes, in remission: Secondary | ICD-10-CM | POA: Diagnosis not present

## 2022-01-19 DIAGNOSIS — I700 Atherosclerosis of aorta: Secondary | ICD-10-CM | POA: Diagnosis not present

## 2022-01-19 DIAGNOSIS — R159 Full incontinence of feces: Secondary | ICD-10-CM | POA: Diagnosis not present

## 2022-02-13 ENCOUNTER — Other Ambulatory Visit: Payer: Self-pay | Admitting: Internal Medicine

## 2022-02-21 ENCOUNTER — Other Ambulatory Visit: Payer: Self-pay | Admitting: Family Medicine

## 2022-02-21 ENCOUNTER — Other Ambulatory Visit: Payer: Self-pay | Admitting: Internal Medicine

## 2022-02-23 ENCOUNTER — Telehealth: Payer: Self-pay | Admitting: *Deleted

## 2022-02-23 ENCOUNTER — Ambulatory Visit: Payer: No Typology Code available for payment source | Attending: Internal Medicine | Admitting: Internal Medicine

## 2022-02-23 ENCOUNTER — Encounter: Payer: Self-pay | Admitting: Internal Medicine

## 2022-02-23 VITALS — BP 120/62 | HR 88 | Ht 64.0 in | Wt 214.0 lb

## 2022-02-23 DIAGNOSIS — I5022 Chronic systolic (congestive) heart failure: Secondary | ICD-10-CM

## 2022-02-23 DIAGNOSIS — I10 Essential (primary) hypertension: Secondary | ICD-10-CM | POA: Diagnosis not present

## 2022-02-23 DIAGNOSIS — I447 Left bundle-branch block, unspecified: Secondary | ICD-10-CM

## 2022-02-23 DIAGNOSIS — E785 Hyperlipidemia, unspecified: Secondary | ICD-10-CM | POA: Diagnosis not present

## 2022-02-23 NOTE — Telephone Encounter (Signed)
Called patient and lvm of upcoming appointments - requested call back to confirm - mailed calendar with welcome packet.

## 2022-02-23 NOTE — Patient Instructions (Signed)
Medication Instructions:  Your physician recommends that you continue on your current medications as directed. Please refer to the Current Medication list given to you today.  *If you need a refill on your cardiac medications before your next appointment, please call your pharmacy*   Follow-Up: At [facility], you and your health needs are our priority.  As part of our continuing mission to provide you with exceptional heart care, we have created designated Provider Care Teams.  These Care Teams include your primary Cardiologist (physician) and Advanced Practice Providers (APPs -  Physician Assistants and Nurse Practitioners) who all work together to provide you with the care you need, when you need it.  We recommend signing up for the patient portal called "MyChart".  Sign up information is provided on this After Visit Summary.  MyChart is used to connect with patients for Virtual Visits (Telemedicine).  Patients are able to view lab/test results, encounter notes, upcoming appointments, etc.  Non-urgent messages can be sent to your provider as well.   To learn more about what you can do with MyChart, go to [email or web address].    Your next appointment:   12 month(s)  Provider:   Pixie Casino, MD

## 2022-02-23 NOTE — Progress Notes (Signed)
OFFICE CONSULT NOTE  Chief Complaint:  Follow-up heart failure  Primary Care Physician: [name], MD  HPI:  Amanda Crawford is a 77 y.o. female who is being seen today for the evaluation of shortness of breath at the request of [name], MD. Amanda Crawford has a past medical history significant for known left bundle branch block dating back to 2008. At the time she had presented with chest pain that radiated to her back and was found to have the abnormal EKG which led to left heart catheterization in January 2009. This was with Dr. Tamala Julian and indicated angiographically normal coronaries. Subsequently she was found to have renal cell carcinoma and underwent nephrectomy and ultimately was found to have recurrent metastatic renal disease to the long and required 2 partial lobectomies, the last in 2014. She also is moderately obese and has mild dyslipidemia on atorvastatin, but is not diabetic. She reports her shortness of breath has recently worsened and she finds that she has difficulty for example singing at church or caring on a long conversation. She also gets fatigue easier and has difficulty walking up stairs or laying flat. She does report a mild nonproductive cough and slight wheezing. Her PCP ordered an echocardiogram, performed on 07/12/2016 which demonstrated a newly decreased EF to 35-40% from 50%. There was LVH and mild diastolic dysfunction as well as mild mitral regurgitation.  11/08/2016  Amanda Crawford returns today for follow-up. She reports improvement in her heart failure symptoms. Her diuretic was increased to 40 mg daily. She also had an increase in carvedilol to 6.25 mg twice a day. She is on a low-dose are due to having a solitary kidney. In June she underwent left and right heart catheterization by Dr. Ellyn Hack which demonstrated a mild nonobstructive coronary disease which is out of proportion for LV function. She was felt to be volume overloaded necessitating the above  changes. Since then she says she's done very well. Weight is down at least 4 pounds but is been as much is 8 pounds lower since June.  07/30/2017  Amanda Crawford returns today for follow-up.  I last saw her in October.  At the time we made additional adjustments on her medications started low-dose losartan.  The plan was to recheck an echocardiogram to see if LV function had improved.  She did have a repeat echocardiogram in April 2019 which showed EF had improved to 45 to 50%.  He does have persistent left bundle branch block.  We had considered CRT-P therapy, however since her LVEF has improved that is not likely to be necessary.  Blood pressures noted to be low normal today 106/58 but she says generally runs around 151-761 systolic.  Symptomatically she is doing well without any worsening shortness of breath or chest pain.  03/04/2020  Amanda Crawford returns today for follow-up.  Seems to be doing pretty well.  She endorses NYHA class I-II symptoms.  EF had been low in the past but improved possibly related to left bundle branch block.  She had nonobstructive coronary disease by cath.  She is not had a repeat echo since 2019.  Blood pressure is excellent today.  02/23/2022  Amanda Crawford is seen today in follow-up.  Overall she seems to be doing well.  She saw Caron Presume, PA-see back in January 2023.  She was doing well and felt to be euvolemic.  She has had some difficulty losing weight and is [place] able [place]  She is aware that she can use Silver sneakers for free activity but she has to go to the [facility] to do that.  Blood pressure is well-controlled today.  Her last echo had showed normalization of LVEF.  She does have a left bundle branch block which is stable on EKG.  She is due for repeat labs and in fact is due for repeat visit with her primary care provider which she says she wants to arrange.  PMHx:  Past Medical History:  Diagnosis Date   Allergic  state 03/12/2015   Arthritis    Colon polyp 11/19/2014   Dermatitis 03/12/2015   Diarrhea 06/29/2016   Diverticulitis    DM (diabetes mellitus), type 2 ([place]) 06/29/2016   GERD (gastroesophageal reflux disease)    occ   Gout 03/12/2015   H/O measles    H/O mumps    Headache(784.0)    migraines   History of chicken pox 11/23/2014   Hyperglycemia 06/29/2016   Lung cancer ([place])    Migraine 11/23/2014   Obesity 11/23/2014   Pneumonia    child   Preventative health care 09/12/2015   Primary hypertension 12/15/2020   Renal cell cancer ([place])    renal cell ca dx 9/01 and 8/08;   Renal insufficiency    Shortness of breath    occ   Skin lesion of breast 03/12/2015    Past Surgical History:  Procedure Laterality Date   ABDOMINAL HYSTERECTOMY     ANAL RECTAL MANOMETRY N/A 06/29/2021   Procedure: ANO RECTAL MANOMETRY;  Surgeon: Lavena Bullion, DO;  Location: [facility];  Service: Gastroenterology;  Laterality: N/A;   APPENDECTOMY     CARDIAC CATHETERIZATION     yrs ago neg   CHOLECYSTECTOMY     COLONOSCOPY  2018   ESOPHAGOGASTRODUODENOSCOPY     years ago   IR RADIOLOGIST EVAL & MGMT  07/17/2018   IR RADIOLOGIST EVAL & MGMT  09/12/2018   IR RADIOLOGIST EVAL & MGMT  06/18/2019   IR RADIOLOGIST EVAL & MGMT  01/13/2020   IR RADIOLOGIST EVAL & MGMT  07/08/2020   IR RADIOLOGIST EVAL & MGMT  10/12/2020   KIDNEY SURGERY  2001   Removed of left kidney    LUNG CANCER SURGERY Left 08   RADIOLOGY WITH ANESTHESIA N/A 08/21/2018   Procedure: CT WITH ANESTHESIA RENAL CRYOABLATION;  Surgeon: Arne Cleveland, MD;  Location: [facility];  Service: Radiology;  Laterality: N/A;   RENAL MASS EXCISION Left 01   RIGHT/LEFT HEART CATH AND CORONARY ANGIOGRAPHY N/A 07/27/2016   Procedure: Right/Left Heart Cath and Coronary Angiography;  Surgeon: Leonie Man, MD;  Location: [facility];  Service: Cardiovascular;  Laterality: N/A;   THORACOTOMY Left 05/09/2012   Procedure: THORACOTOMY MAJOR;  Surgeon:  Gaye Pollack, MD;  Location: [facility];  Service: Thoracic;  Laterality: Left;   VIDEO BRONCHOSCOPY N/A 05/09/2012   Procedure: VIDEO BRONCHOSCOPY;  Surgeon: Gaye Pollack, MD;  Location: [facility];  Service: Thoracic;  Laterality: N/A;   WEDGE RESECTION Left 05/09/2012   Procedure: LEFT UPPER LOBE WEDGE RESECTION;  Surgeon: Gaye Pollack, MD;  Location: [facility];  Service: Thoracic;  Laterality: Left;    FAMHx:  Family History  Problem Relation Age of Onset   Dementia Mother    Heart failure Father    Diabetes Father    Hyperlipidemia Father    Heart disease Father    Hypertension Father    Asthma  Brother    Asthma Daughter    Alcohol abuse Maternal Aunt    Colon cancer Paternal Aunt    Cancer Paternal Grandmother    Hearing loss Paternal Grandfather    Stroke Paternal Grandfather     SOCHx:   reports that she quit smoking about 15 years ago. Her smoking use included cigarettes. She has a 17.50 pack-year smoking history. She has never used smokeless tobacco. She reports current alcohol use. She reports that she does not use drugs.  ALLERGIES:  No Known Allergies  ROS: Pertinent items noted in HPI and remainder of comprehensive ROS otherwise negative.  HOME MEDS: Current Outpatient Medications on File Prior to Visit  Medication Sig Dispense Refill   allopurinol (ZYLOPRIM) 300 MG tablet Take 1 tablet (300 mg total) by mouth daily. 30 tablet 0   aspirin EC 81 MG tablet Take 81 mg by mouth daily.     atorvastatin (LIPITOR) 20 MG tablet Take 1 tablet (20 mg total) by mouth daily. 30 tablet 0   Calcium Carb-Cholecalciferol (CALCIUM 600 + D PO) Take 1 tablet by mouth daily.     carvedilol (COREG) 6.25 MG tablet TAKE 1 TABLET BY MOUTH 2 TIMES DAILY WITH A MEAL. (Patient taking differently: Take 0.5 tablets by mouth 2 (two) times daily with a meal.) 180 tablet 2   diphenhydramine-acetaminophen (TYLENOL PM) 25-500 MG TABS Take 1 tablet by mouth at bedtime as needed (sleep).      furosemide  (LASIX) 40 MG tablet TAKE 1 TABLET BY MOUTH EVERY DAY 90 tablet 3   glimepiride (AMARYL) 2 MG tablet TAKE 1 TABLET BY MOUTH EVERY DAY WITH BREAKFAST 90 tablet 0   hyoscyamine (LEVSIN SL) 0.125 MG SL tablet Place 1 tablet (0.125 mg total) under the tongue every 6 (six) hours as needed. 30 tablet 1   insulin glargine (LANTUS SOLOSTAR) 100 UNIT/ML Solostar Pen Inject 10 Units into the skin daily. 15 mL PRN   Insulin Pen Needle (BD PEN NEEDLE NANO U/F) 32G X 4 MM MISC USE AS DIRECTED WITH LANTUS ONCE A DAY. 100 each 1   irbesartan (AVAPRO) 75 MG tablet Take 1 tablet (75 mg total) by mouth daily. 90 tablet 3   Lancets (ONETOUCH DELICA PLUS GNFAOZ30Q) MISC USE AS DIRECTED TO TEST DAILY 100 each 12   meclizine (ANTIVERT) 12.5 MG tablet Take 1 tablet (12.5 mg total) by mouth 3 (three) times daily as needed for dizziness. As needed for   Dizziness or nausea 30 tablet 1   nystatin cream (MYCOSTATIN) Apply 1 application topically 2 (two) times daily. 30 g 1   ONETOUCH VERIO test strip CEHCK BLOOD SUGAR DAILY AS NEEDED 100 strip 12   Vitamin D, Ergocalciferol, (DRISDOL) 1.25 MG (50000 UNIT) CAPS capsule TAKE 1 CAPSULE (50,000 UNITS TOTAL) BY MOUTH EVERY 7 (SEVEN) DAYS 12 capsule 1   [DISCONTINUED] glyBURIDE-metformin (GLUCOVANCE) 1.25-250 MG tablet Take 1 tablet by mouth 2 (two) times daily with a meal. 60 tablet 3   No current facility-administered medications on file prior to visit.    LABS/IMAGING: No results found for this or any previous visit (from the past 48 hour(s)). No results found.  LIPID PANEL:    Component Value Date/Time   CHOL 206 (H) 08/10/2021 1008   CHOL 193 09/30/2019 1014   TRIG (H) 08/10/2021 1008    418.0 Triglyceride is over 400; calculations on Lipids are invalid.   TRIG 267 (H) 09/30/2019 1014   HDL 45.90 08/10/2021 1008   CHOLHDL 4  08/10/2021 1008   VLDL 63.8 (H) 03/03/2021 1157   LDLCALC (H) 01/31/2007 0800    117        Total Cholesterol/HDL:CHD Risk Coronary Heart  Disease Risk Table                     Men   Women  1/2 Average Risk   3.4   3.3  Average Risk       5.0   4.4  2 X Average Risk   9.6   7.1  3 X Average Risk  23.4   11.0        Use the calculated Patient Ratio above and the CHD Risk Table to determine the patient's CHD Risk.        ATP III CLASSIFICATION (LDL):  <100     mg/dL   Optimal  100-129  mg/dL   Near or Above                    Optimal  130-159  mg/dL   Borderline  160-189  mg/dL   High  >190     mg/dL   Very High   LDLDIRECT 71.0 08/10/2021 1008    WEIGHTS: Wt Readings from Last 3 Encounters:  02/23/22 214 lb (97.1 kg)  12/07/21 221 lb 3.2 oz (100.3 kg)  07/14/21 213 lb (96.6 kg)    VITALS: BP 120/62   Pulse 88   Ht 5\' 4"  (1.626 m)   Wt 214 lb (97.1 kg)   SpO2 98%   BMI 36.73 kg/m   EXAM: General appearance: alert and no distress Neck: no carotid bruit and no JVD Lungs: clear to auscultation bilaterally Heart: regular rate and rhythm, S1, S2 normal, no murmur, click, rub or gallop Abdomen: soft, non-tender; bowel sounds normal; no masses,  no organomegaly Extremities: edema Trace sock line edema Pulses: 2+ and symmetric Skin: Skin color, texture, turgor normal. No rashes or lesions Neurologic: Grossly normal Psych: Pleasant  EKG: Normal sinus rhythm at 88, LBBB-personally reviewed  ASSESSMENT: NICM/Chronic systolic congestive heart failure, LVEF improved 55-60% (03/2020) from 35-40% LBBB Mild non-obstructive CAD by cath (06/2016) Dyslipidemia  PLAN: 1.   Amanda Crawford continues to endorse no more than NYHA class II symptoms.  Her LVEF had improved up to 55 to 60% back in 2022.  She has a stable left bundle branch block with a QRS D of 142 ms.  She was found only to have mild nonobstructive coronary disease by cath in 2018.  She does need reassessment of her lipids and follow-up of her diabetes.  This will be accomplished by her PCP in the next few months.  I advised her to contact me if that is not  able to be arranged I be happy to order lab work.  Plan follow-up with me annually or sooner as necessary.  Pixie Casino, MD, [facility], [place] Director of the Advanced Lipid Disorders &  Cardiovascular Risk Reduction Clinic Diplomate of the American Board of Clinical Lipidology Attending Cardiologist  Direct Dial: [phone]  Fax: [phone]  Website:  [email or web address] Josias Tomerlin 02/23/2022, 11:04 AM

## 2022-03-17 ENCOUNTER — Other Ambulatory Visit: Payer: Self-pay | Admitting: Family Medicine

## 2022-03-28 ENCOUNTER — Other Ambulatory Visit: Payer: Self-pay | Admitting: Family Medicine

## 2022-03-28 DIAGNOSIS — Z1231 Encounter for screening mammogram for malignant neoplasm of breast: Secondary | ICD-10-CM

## 2022-04-10 NOTE — Assessment & Plan Note (Signed)
hgba1c acceptable, minimize simple carbs. Increase exercise as tolerated. Continue current meds 

## 2022-04-10 NOTE — Assessment & Plan Note (Signed)
Hydrate and monitor 

## 2022-04-10 NOTE — Assessment & Plan Note (Signed)
No recent exacerbation 

## 2022-04-10 NOTE — Assessment & Plan Note (Signed)
Encourage heart healthy diet such as MIND or DASH diet, increase exercise, avoid trans fats, simple carbohydrates and processed foods, consider a krill or fish or flaxseed oil cap daily.  °

## 2022-04-10 NOTE — Assessment & Plan Note (Signed)
Supplement and monitor 

## 2022-04-11 ENCOUNTER — Ambulatory Visit (INDEPENDENT_AMBULATORY_CARE_PROVIDER_SITE_OTHER): Payer: No Typology Code available for payment source | Admitting: Family Medicine

## 2022-04-11 VITALS — BP 122/64 | HR 90 | Temp 97.5°F | Resp 16 | Ht 64.0 in | Wt 214.0 lb

## 2022-04-11 DIAGNOSIS — I5022 Chronic systolic (congestive) heart failure: Secondary | ICD-10-CM | POA: Diagnosis not present

## 2022-04-11 DIAGNOSIS — E119 Type 2 diabetes mellitus without complications: Secondary | ICD-10-CM | POA: Diagnosis not present

## 2022-04-11 DIAGNOSIS — E785 Hyperlipidemia, unspecified: Secondary | ICD-10-CM | POA: Diagnosis not present

## 2022-04-11 DIAGNOSIS — E559 Vitamin D deficiency, unspecified: Secondary | ICD-10-CM | POA: Diagnosis not present

## 2022-04-11 DIAGNOSIS — N289 Disorder of kidney and ureter, unspecified: Secondary | ICD-10-CM

## 2022-04-11 MED ORDER — CARVEDILOL 3.125 MG PO TABS: 3.1250 mg | ORAL_TABLET | Freq: Two times a day (BID) | ORAL | 3 refills | Status: DC

## 2022-04-11 NOTE — Patient Instructions (Addendum)
Increase Benefiber to three Add back a probiotic sucha the [employer] company  Magnesium Glycinate 200-400 mg at bedtime  L Tryptophan capsule at bedtime  Diarrhea, Adult Diarrhea is frequent loose and sometimes watery bowel movements. Diarrhea can make you feel weak and cause you to become dehydrated. Dehydration is a condition in which there is not enough water or other fluids in the body. Dehydration can make you tired and thirsty, cause you to have a dry mouth, and decrease how often you urinate. Diarrhea typically lasts 2-3 days. However, it can last longer if it is a sign of something more serious. It is important to treat your diarrhea as told by your health care provider. Follow these instructions at home: Eating and drinking     Follow these recommendations as told by your health care provider: Take an oral rehydration solution (ORS). This is an over-the-counter medicine that helps return your body to its normal balance of nutrients and water. It is found at pharmacies and retail stores. Drink enough fluid to keep your urine pale yellow. Drink fluids such as water, diluted fruit juice, and low-calorie sports drinks. You can drink milk also, if desired. Sucking on ice chips is another way to get fluids. Avoid drinking fluids that contain a lot of sugar or caffeine, such as soda, energy drinks, and regular sports drinks. Avoid alcohol. Eat bland, easy-to-digest foods in small amounts as you are able. These foods include bananas, applesauce, rice, lean meats, toast, and crackers. Avoid spicy or fatty foods.  Medicines Take over-the-counter and prescription medicines only as told by your health care provider. If you were prescribed antibiotics, take them as told by your health care provider. Do not stop using the antibiotic even if you start to feel better. General instructions  Wash your hands often using soap and water for at least 20 seconds. If soap and water are not available, use  hand sanitizer. Others in the household should wash their hands as well. Hands should be washed: After using the toilet or changing a diaper. Before preparing, cooking, or serving food. While caring for a sick person or while visiting someone in a hospital. Rest at home while you recover. Take a warm bath to relieve any burning or pain from frequent diarrhea episodes. Watch your condition for any changes. Contact a health care provider if: You have a fever. Your diarrhea gets worse. You have new symptoms. You vomit every time you eat or drink. You feel light-headed, dizzy, or have a headache. You have muscle cramps. You have signs of dehydration, such as: Dark urine, very little urine, or no urine. Cracked lips. Dry mouth. Sunken eyes. Sleepiness. Weakness. You have bloody or black stools or stools that look like tar. You have severe pain, cramping, or bloating in your abdomen. Your skin feels cold and clammy. You feel confused. Get help right away if: You have chest pain or your heart is beating very quickly. You have trouble breathing or you are breathing very quickly. You feel extremely weak or you faint. These symptoms may be an emergency. Get help right away. Call 911. Do not wait to see if the symptoms will go away. Do not drive yourself to the hospital. This information is not intended to replace advice given to you by your health care provider. Make sure you discuss any questions you have with your health care provider. Document Revised: 07/05/2021 Document Reviewed: 07/05/2021 Elsevier Patient Education  [place] for Diabetes Mellitus, Adult Carbohydrate  counting is a method of keeping track of how many carbohydrates you eat. Eating carbohydrates increases the amount of sugar (glucose) in the blood. Counting how many carbohydrates you eat improves how well you manage your blood glucose. This, in turn, helps you manage your  diabetes. Carbohydrates are measured in grams (g) per serving. It is important to know how many carbohydrates (in grams or by serving size) you can have in each meal. This is different for every person. A dietitian can help you make a meal plan and calculate how many carbohydrates you should have at each meal and snack. What foods contain carbohydrates? Carbohydrates are found in the following foods: Grains, such as breads and cereals. Dried beans and soy products. Starchy vegetables, such as potatoes, peas, and corn. Fruit and fruit juices. Milk and yogurt. Sweets and snack foods, such as cake, cookies, candy, chips, and soft drinks. How do I count carbohydrates in foods? There are two ways to count carbohydrates in food. You can read food labels or learn standard serving sizes of foods. You can use either of these methods or a combination of both. Using the Nutrition Facts label The Nutrition Facts list is included on the labels of almost all packaged foods and beverages in the [place]. It includes: The serving size. Information about nutrients in each serving, including the grams of carbohydrate per serving. To use the Nutrition Facts, decide how many servings you will have. Then, multiply the number of servings by the number of carbohydrates per serving. The resulting number is the total grams of carbohydrates that you will be having. Learning the standard serving sizes of foods When you eat carbohydrate foods that are not packaged or do not include Nutrition Facts on the label, you need to measure the servings in order to count the grams of carbohydrates. Measure the foods that you will eat with a food scale or measuring cup, if needed. Decide how many standard-size servings you will eat. Multiply the number of servings by 15. For foods that contain carbohydrates, one serving equals 15 g of carbohydrates. For example, if you eat 2 cups or 10 oz (300 g) of strawberries, you will have  eaten 2 servings and 30 g of carbohydrates (2 servings x 15 g = 30 g). For foods that have more than one food mixed, such as soups and casseroles, you must count the carbohydrates in each food that is included. The following list contains standard serving sizes of common carbohydrate-rich foods. Each of these servings has about 15 g of carbohydrates: 1 slice of bread. 1 six-inch (15 cm) tortilla. ? cup or 2 oz (53 g) cooked rice or pasta.  cup or 3 oz (85 g) cooked or canned, drained and rinsed beans or lentils.  cup or 3 oz (85 g) starchy vegetable, such as peas, corn, or squash.  cup or 4 oz (120 g) hot cereal.  cup or 3 oz (85 g) boiled or mashed potatoes, or  or 3 oz (85 g) of a large baked potato.  cup or 4 fl oz (118 mL) fruit juice. 1 cup or 8 fl oz (237 mL) milk. 1 small or 4 oz (106 g) apple.  or 2 oz (63 g) of a medium banana. 1 cup or 5 oz (150 g) strawberries. 3 cups or 1 oz (28.3 g) popped popcorn. What is an example of carbohydrate counting? To calculate the grams of carbohydrates in this sample meal, follow the steps shown below. Sample meal 3 oz (  85 g) chicken breast. ? cup or 4 oz (106 g) brown rice.  cup or 3 oz (85 g) corn. 1 cup or 8 fl oz (237 mL) milk. 1 cup or 5 oz (150 g) strawberries with sugar-free whipped topping. Carbohydrate calculation Identify the foods that contain carbohydrates: Rice. Corn. Milk. Strawberries. Calculate how many servings you have of each food: 2 servings rice. 1 serving corn. 1 serving milk. 1 serving strawberries. Multiply each number of servings by 15 g: 2 servings rice x 15 g = 30 g. 1 serving corn x 15 g = 15 g. 1 serving milk x 15 g = 15 g. 1 serving strawberries x 15 g = 15 g. Add together all of the amounts to find the total grams of carbohydrates eaten: 30 g + 15 g + 15 g + 15 g = 75 g of carbohydrates total. What are tips for following this plan? Shopping Develop a meal plan and then make a shopping  list. Buy fresh and frozen vegetables, fresh and frozen fruit, dairy, eggs, beans, lentils, and whole grains. Look at food labels. Choose foods that have more fiber and less sugar. Avoid processed foods and foods with added sugars. Meal planning Aim to have the same number of grams of carbohydrates at each meal and for each snack time. Plan to have regular, balanced meals and snacks. Where to find more information American Diabetes Association: diabetes.org Centers for Disease Control and Prevention: StoreMirror.com.[place] Academy of Nutrition and Dietetics: eatright.org Association of Diabetes Care & Education Specialists: diabeteseducator.org Summary Carbohydrate counting is a method of keeping track of how many carbohydrates you eat. Eating carbohydrates increases the amount of sugar (glucose) in your blood. Counting how many carbohydrates you eat improves how well you manage your blood glucose. This helps you manage your diabetes. A dietitian can help you make a meal plan and calculate how many carbohydrates you should have at each meal and snack. This information is not intended to replace advice given to you by your health care provider. Make sure you discuss any questions you have with your health care provider. Document Revised: 08/20/2019 Document Reviewed: 08/20/2019 Elsevier Patient Education  [place].

## 2022-04-11 NOTE — Progress Notes (Signed)
Subjective:   By signing my name below, I, [name], attest that this documentation has been prepared under the direction and in the presence of [name], MD., 04/11/2022.   Patient ID: Amanda Crawford, female    DOB: 04/22/1945, 77 y.o.   MRN: [id number]  No chief complaint on file.  HPI Patient is in today for an office visit.  Abdominal Asymmetry Patient states that she is unable to sleep on her left side due to asymmetry of the abdomen. This is due to multiple surgeries that is more prominent on the left side and making it difficulty for her to move.  Chronic Diarrhea Patient complains of chronic diarrhea and states that she is having 3-4 bowel movements daily. She has been experiencing fecal incontinence for several years and has been managing this with GI Dr. Bryan Lemma. She denies abdominal pain/ blood in stool/CP/palpitations/HA/fever/ chills/nausea.  Hypertension/Dyspnea Patient's blood pressure is controlled. She states she has been taking 0.5 tablets of Carvedilol 6.25 mg twice daily. Additionally, she reports she experiences dyspnea upon exertion and is struggling to lose weight. Body mass index is 36.73 kg/m. BP Readings from Last 3 Encounters:  04/11/22 122/64  02/23/22 120/62  12/07/21 (!) 122/57   Pulse Readings from Last 3 Encounters:  04/11/22 90  02/23/22 88  12/07/21 90   Wt Readings from Last 3 Encounters:  04/11/22 214 lb (97.1 kg)  02/23/22 214 lb (97.1 kg)  12/07/21 221 lb 3.2 oz (100.3 kg)   Insomnia Patient reports that she has difficulty falling asleep and is interested in taking supplements.  Past Medical History:  Diagnosis Date   Allergic state 03/12/2015   Arthritis    Colon polyp 11/19/2014   Dermatitis 03/12/2015   Diarrhea 06/29/2016   Diverticulitis    DM (diabetes mellitus), type 2 ([place]) 06/29/2016   GERD (gastroesophageal reflux disease)    occ   Gout 03/12/2015   H/O measles    H/O mumps    Headache(784.0)     migraines   History of chicken pox 11/23/2014   Hyperglycemia 06/29/2016   Lung cancer ([place])    Migraine 11/23/2014   Obesity 11/23/2014   Pneumonia    child   Preventative health care 09/12/2015   Primary hypertension 12/15/2020   Renal cell cancer ([place])    renal cell ca dx 9/01 and 8/08;   Renal insufficiency    Shortness of breath    occ   Skin lesion of breast 03/12/2015    Past Surgical History:  Procedure Laterality Date   ABDOMINAL HYSTERECTOMY     ANAL RECTAL MANOMETRY N/A 06/29/2021   Procedure: ANO RECTAL MANOMETRY;  Surgeon: Lavena Bullion, DO;  Location: [facility];  Service: Gastroenterology;  Laterality: N/A;   APPENDECTOMY     CARDIAC CATHETERIZATION     yrs ago neg   CHOLECYSTECTOMY     COLONOSCOPY  2018   ESOPHAGOGASTRODUODENOSCOPY     years ago   IR RADIOLOGIST EVAL & MGMT  07/17/2018   IR RADIOLOGIST EVAL & MGMT  09/12/2018   IR RADIOLOGIST EVAL & MGMT  06/18/2019   IR RADIOLOGIST EVAL & MGMT  01/13/2020   IR RADIOLOGIST EVAL & MGMT  07/08/2020   IR RADIOLOGIST EVAL & MGMT  10/12/2020   KIDNEY SURGERY  2001   Removed of left kidney    LUNG CANCER SURGERY Left 08   RADIOLOGY WITH ANESTHESIA N/A 08/21/2018   Procedure: CT WITH ANESTHESIA RENAL CRYOABLATION;  Surgeon: Arne Cleveland,  MD;  Location: [facility];  Service: Radiology;  Laterality: N/A;   RENAL MASS EXCISION Left 01   RIGHT/LEFT HEART CATH AND CORONARY ANGIOGRAPHY N/A 07/27/2016   Procedure: Right/Left Heart Cath and Coronary Angiography;  Surgeon: Leonie Man, MD;  Location: [facility];  Service: Cardiovascular;  Laterality: N/A;   THORACOTOMY Left 05/09/2012   Procedure: THORACOTOMY MAJOR;  Surgeon: Gaye Pollack, MD;  Location: [facility];  Service: Thoracic;  Laterality: Left;   VIDEO BRONCHOSCOPY N/A 05/09/2012   Procedure: VIDEO BRONCHOSCOPY;  Surgeon: Gaye Pollack, MD;  Location: [facility];  Service: Thoracic;  Laterality: N/A;   WEDGE RESECTION Left 05/09/2012   Procedure: LEFT UPPER  LOBE WEDGE RESECTION;  Surgeon: Gaye Pollack, MD;  Location: [facility];  Service: Thoracic;  Laterality: Left;    Family History  Problem Relation Age of Onset   Dementia Mother    Heart failure Father    Diabetes Father    Hyperlipidemia Father    Heart disease Father    Hypertension Father    Asthma Brother    Asthma Daughter    Alcohol abuse Maternal Aunt    Colon cancer Paternal Aunt    Cancer Paternal Grandmother    Hearing loss Paternal Grandfather    Stroke Paternal Grandfather     Social History   Socioeconomic History   Marital status: Married    Spouse name: Not on file   Number of children: Not on file   Years of education: Not on file   Highest education level: Not on file  Occupational History   Occupation: retired  Tobacco Use   Smoking status: Former    Packs/day: 0.50    Years: 35.00    Total pack years: 17.50    Types: Cigarettes    Quit date: 05/08/2006    Years since quitting: 15.9   Smokeless tobacco: Never  Vaping Use   Vaping Use: Never used  Substance and Sexual Activity   Alcohol use: Yes    Comment: seldomly   Drug use: No   Sexual activity: Not on file    Comment: lives with husband, no dietary restrictions.   Other Topics Concern   Not on file  Social History Narrative   Not on file   Social Determinants of Health   Financial Resource Strain: Low Risk  (07/05/2020)   Overall Financial Resource Strain (CARDIA)    Difficulty of Paying Living Expenses: Not hard at all  Food Insecurity: No Food Insecurity (07/05/2020)   Hunger Vital Sign    Worried About Running Out of Food in the Last Year: Never true    Ran Out of Food in the Last Year: Never true  Transportation Needs: No Transportation Needs (07/05/2020)   PRAPARE - Hydrologist (Medical): No    Lack of Transportation (Non-Medical): No  Physical Activity: Inactive (07/05/2020)   Exercise Vital Sign    Days of Exercise per Week: 0 days    Minutes of Exercise  per Session: 0 min  Stress: No Stress Concern Present (07/05/2020)   [place]    Feeling of Stress : Only a little  Social Connections: Moderately Integrated (07/05/2020)   Social Connection and Isolation Panel [NHANES]    Frequency of Communication with Friends and Family: More than three times a week    Frequency of Social Gatherings with Friends and Family: Once a week    Attends  Religious Services: More than 4 times per year    Active Member of Clubs or Organizations: No    Attends Archivist Meetings: Never    Marital Status: Married  Human resources officer Violence: Not At Risk (07/05/2020)   Humiliation, Afraid, Rape, and Kick questionnaire    Fear of Current or Ex-Partner: No    Emotionally Abused: No    Physically Abused: No    Sexually Abused: No    Outpatient Medications Prior to Visit  Medication Sig Dispense Refill   allopurinol (ZYLOPRIM) 300 MG tablet Take 1 tablet (300 mg total) by mouth daily. 30 tablet 0   aspirin EC 81 MG tablet Take 81 mg by mouth daily.     atorvastatin (LIPITOR) 20 MG tablet Take 1 tablet (20 mg total) by mouth daily. 30 tablet 0   Calcium Carb-Cholecalciferol (CALCIUM 600 + D PO) Take 1 tablet by mouth daily.     carvedilol (COREG) 6.25 MG tablet TAKE 1 TABLET BY MOUTH 2 TIMES DAILY WITH A MEAL. (Patient taking differently: Take 0.5 tablets by mouth 2 (two) times daily with a meal.) 180 tablet 2   diphenhydramine-acetaminophen (TYLENOL PM) 25-500 MG TABS Take 1 tablet by mouth at bedtime as needed (sleep).      furosemide (LASIX) 40 MG tablet TAKE 1 TABLET BY MOUTH EVERY DAY 90 tablet 3   glimepiride (AMARYL) 2 MG tablet TAKE 1 TABLET BY MOUTH EVERY DAY WITH BREAKFAST 90 tablet 0   hyoscyamine (LEVSIN SL) 0.125 MG SL tablet Place 1 tablet (0.125 mg total) under the tongue every 6 (six) hours as needed. 30 tablet 1   insulin glargine (LANTUS SOLOSTAR) 100 UNIT/ML Solostar Pen Inject  10 Units into the skin daily. 15 mL PRN   Insulin Pen Needle (BD PEN NEEDLE NANO U/F) 32G X 4 MM MISC USE AS DIRECTED WITH LANTUS ONCE A DAY. 100 each 1   irbesartan (AVAPRO) 75 MG tablet Take 1 tablet (75 mg total) by mouth daily. 90 tablet 3   Lancets (ONETOUCH DELICA PLUS 123XX123) MISC USE AS DIRECTED TO TEST DAILY 100 each 12   meclizine (ANTIVERT) 12.5 MG tablet Take 1 tablet (12.5 mg total) by mouth 3 (three) times daily as needed for dizziness. As needed for   Dizziness or nausea 30 tablet 1   nystatin cream (MYCOSTATIN) Apply 1 application topically 2 (two) times daily. 30 g 1   ONETOUCH VERIO test strip CEHCK BLOOD SUGAR DAILY AS NEEDED 100 strip 12   Vitamin D, Ergocalciferol, (DRISDOL) 1.25 MG (50000 UNIT) CAPS capsule TAKE 1 CAPSULE (50,000 UNITS TOTAL) BY MOUTH EVERY 7 (SEVEN) DAYS 12 capsule 1   No facility-administered medications prior to visit.    No Known Allergies  Review of Systems  Constitutional:  Negative for chills and fever.  Respiratory:  Positive for shortness of breath.   Cardiovascular:  Negative for chest pain and palpitations.  Gastrointestinal:  Positive for diarrhea. Negative for abdominal pain, blood in stool and nausea.  Genitourinary:  Negative for dysuria, frequency, hematuria and urgency.  Skin:           Neurological:  Negative for headaches.       Objective:    Physical Exam Constitutional:      General: She is not in acute distress.    Appearance: Normal appearance. She is normal weight. She is not ill-appearing.  HENT:     Head: Normocephalic and atraumatic.     Right Ear: External ear normal.  Left Ear: External ear normal.     Nose: Nose normal.     Mouth/Throat:     Mouth: Mucous membranes are moist.     Pharynx: Oropharynx is clear.  Eyes:     General:        Right eye: No discharge.        Left eye: No discharge.     Extraocular Movements: Extraocular movements intact.     Conjunctiva/sclera: Conjunctivae normal.      Pupils: Pupils are equal, round, and reactive to light.  Cardiovascular:     Rate and Rhythm: Normal rate and regular rhythm.     Pulses: Normal pulses.     Heart sounds: Normal heart sounds. No murmur heard.    No gallop.  Pulmonary:     Effort: Pulmonary effort is normal. No respiratory distress.     Breath sounds: Normal breath sounds. No wheezing or rales.  Abdominal:     General: Bowel sounds are normal.     Palpations: Abdomen is soft.     Tenderness: There is no abdominal tenderness. There is no guarding.  Musculoskeletal:        General: Normal range of motion.     Cervical back: Normal range of motion.     Right lower leg: No edema.     Left lower leg: No edema.     Comments: Asymmetry of the abdomen due to multiple surgeries; more prominent on the left side.  Skin:    General: Skin is warm and dry.     Coloration: Skin is not pale.  Neurological:     Mental Status: She is alert and oriented to person, place, and time.  Psychiatric:        Mood and Affect: Mood normal.        Behavior: Behavior normal.        Judgment: Judgment normal.    There were no vitals taken for this visit. Wt Readings from Last 3 Encounters:  02/23/22 214 lb (97.1 kg)  12/07/21 221 lb 3.2 oz (100.3 kg)  07/14/21 213 lb (96.6 kg)   Diabetic Foot Exam - Simple   No data filed    Lab Results  Component Value Date   WBC 6.9 12/01/2021   HGB 12.7 12/01/2021   HCT 38.7 12/01/2021   PLT 153 12/01/2021   GLUCOSE 170 (H) 12/01/2021   CHOL 206 (H) 08/10/2021   TRIG (H) 08/10/2021    418.0 Triglyceride is over 400; calculations on Lipids are invalid.   HDL 45.90 08/10/2021   LDLDIRECT 71.0 08/10/2021   LDLCALC (H) 01/31/2007    117        Total Cholesterol/HDL:CHD Risk Coronary Heart Disease Risk Table                     Men   Women  1/2 Average Risk   3.4   3.3  Average Risk       5.0   4.4  2 X Average Risk   9.6   7.1  3 X Average Risk  23.4   11.0        Use the calculated  Patient Ratio above and the CHD Risk Table to determine the patient's CHD Risk.        ATP III CLASSIFICATION (LDL):  <100     mg/dL   Optimal  100-129  mg/dL   Near or Above  Optimal  130-159  mg/dL   Borderline  160-189  mg/dL   High  >190     mg/dL   Very High   ALT 40 12/01/2021   AST 41 12/01/2021   NA 137 12/01/2021   K 5.3 (H) 12/01/2021   CL 105 12/01/2021   CREATININE 1.41 (H) 12/01/2021   BUN 35 (H) 12/01/2021   CO2 27 12/01/2021   TSH 2.73 08/10/2021   INR 1.1 (H) 03/22/2021   HGBA1C 6.4 03/03/2021    Lab Results  Component Value Date   TSH 2.73 08/10/2021   Lab Results  Component Value Date   WBC 6.9 12/01/2021   HGB 12.7 12/01/2021   HCT 38.7 12/01/2021   MCV 102.1 (H) 12/01/2021   PLT 153 12/01/2021   Lab Results  Component Value Date   NA 137 12/01/2021   K 5.3 (H) 12/01/2021   CHLORIDE 104 11/01/2016   CO2 27 12/01/2021   GLUCOSE 170 (H) 12/01/2021   BUN 35 (H) 12/01/2021   CREATININE 1.41 (H) 12/01/2021   BILITOT 1.1 12/01/2021   ALKPHOS 61 12/01/2021   AST 41 12/01/2021   ALT 40 12/01/2021   PROT 7.3 12/01/2021   ALBUMIN 4.1 12/01/2021   CALCIUM 10.9 (H) 12/01/2021   ANIONGAP 5 12/01/2021   EGFR 50 (L) 11/01/2016   GFR 34.32 (L) 08/10/2021   Lab Results  Component Value Date   CHOL 206 (H) 08/10/2021   Lab Results  Component Value Date   HDL 45.90 08/10/2021   Lab Results  Component Value Date   LDLCALC (H) 01/31/2007    117        Total Cholesterol/HDL:CHD Risk Coronary Heart Disease Risk Table                     Men   Women  1/2 Average Risk   3.4   3.3  Average Risk       5.0   4.4  2 X Average Risk   9.6   7.1  3 X Average Risk  23.4   11.0        Use the calculated Patient Ratio above and the CHD Risk Table to determine the patient's CHD Risk.        ATP III CLASSIFICATION (LDL):  <100     mg/dL   Optimal  100-129  mg/dL   Near or Above                    Optimal  130-159  mg/dL    Borderline  160-189  mg/dL   High  >190     mg/dL   Very High   Lab Results  Component Value Date   TRIG (H) 08/10/2021    418.0 Triglyceride is over 400; calculations on Lipids are invalid.   Lab Results  Component Value Date   CHOLHDL 4 08/10/2021   Lab Results  Component Value Date   HGBA1C 6.4 03/03/2021      Assessment & Plan:  Chronic Diarrhea: Encouraged additional fiber and probiotics to diet.  Hypertension: Carvedilol 6.25 mg twice daily decreased to 3.125 mg twice daily.  Insomnia: Recommended magnesium glycinate, melatonin 2-5 mg, and  L-tryptophan.  Labs: Routine blood work ordered.  Weight Management: Encouraged heart healthy diet, 60-80 oz of non-alcohol/ non-caffeinated fluids, and minimum of 4000 steps daily. Problem List Items Addressed This Visit       Cardiovascular and Mediastinum   Chronic systolic heart failure (  [place])     Endocrine   DM (diabetes mellitus), type 2 ([place]) - Primary     Genitourinary   Renal insufficiency     Other   Hyperlipidemia   Vitamin D deficiency   No orders of the defined types were placed in this encounter.  I, [name], personally preformed the services described in this documentation.  All medical record entries made by the scribe were at my direction and in my presence.  I have reviewed the chart and discharge instructions (if applicable) and agree that the record reflects my personal performance and is accurate and complete. 04/11/2022  I,Mohammed Iqbal,acting as a scribe for Penni Homans, MD.,have documented all relevant documentation on the behalf of Penni Homans, MD,as directed by  Penni Homans, MD while in the presence of Penni Homans, MD.  [name]

## 2022-04-12 ENCOUNTER — Encounter: Payer: Self-pay | Admitting: Family Medicine

## 2022-04-12 LAB — PTH, INTACT AND CALCIUM
Calcium: 10.4 mg/dL (ref 8.6–10.4)
PTH: 118 pg/mL — ABNORMAL HIGH (ref 16–77)

## 2022-04-12 LAB — CBC WITH DIFFERENTIAL/PLATELET
Basophils Absolute: 0.1 10*3/uL (ref 0.0–0.1)
Basophils Relative: 1.1 % (ref 0.0–3.0)
Eosinophils Absolute: 0.2 10*3/uL (ref 0.0–0.7)
Eosinophils Relative: 3 % (ref 0.0–5.0)
HCT: 36 % (ref 36.0–46.0)
Hemoglobin: 12.1 g/dL (ref 12.0–15.0)
Lymphocytes Relative: 26.2 % (ref 12.0–46.0)
Lymphs Abs: 1.9 10*3/uL (ref 0.7–4.0)
MCHC: 33.7 g/dL (ref 30.0–36.0)
MCV: 100.6 fl — ABNORMAL HIGH (ref 78.0–100.0)
Monocytes Absolute: 0.5 10*3/uL (ref 0.1–1.0)
Monocytes Relative: 6.7 % (ref 3.0–12.0)
Neutro Abs: 4.6 10*3/uL (ref 1.4–7.7)
Neutrophils Relative %: 63 % (ref 43.0–77.0)
Platelets: 181 10*3/uL (ref 150.0–400.0)
RBC: 3.57 Mil/uL — ABNORMAL LOW (ref 3.87–5.11)
RDW: 16.1 % — ABNORMAL HIGH (ref 11.5–15.5)
WBC: 7.3 10*3/uL (ref 4.0–10.5)

## 2022-04-12 LAB — COMPREHENSIVE METABOLIC PANEL
ALT: 30 U/L (ref 0–35)
AST: 35 U/L (ref 0–37)
Albumin: 3.8 g/dL (ref 3.5–5.2)
Alkaline Phosphatase: 69 U/L (ref 39–117)
BUN: 43 mg/dL — ABNORMAL HIGH (ref 6–23)
CO2: 28 mEq/L (ref 19–32)
Calcium: 10.5 mg/dL (ref 8.4–10.5)
Chloride: 103 mEq/L (ref 96–112)
Creatinine, Ser: 1.69 mg/dL — ABNORMAL HIGH (ref 0.40–1.20)
GFR: 28.66 mL/min — ABNORMAL LOW (ref >60.00)
Glucose, Bld: 192 mg/dL — ABNORMAL HIGH (ref 70–99)
Potassium: 4.8 mEq/L (ref 3.5–5.1)
Sodium: 140 mEq/L (ref 135–145)
Total Bilirubin: 0.9 mg/dL (ref 0.2–1.2)
Total Protein: 6.5 g/dL (ref 6.0–8.3)

## 2022-04-12 LAB — LDL CHOLESTEROL, DIRECT: Direct LDL: 71 mg/dL

## 2022-04-12 LAB — TSH: TSH: 2.08 u[IU]/mL (ref 0.35–5.50)

## 2022-04-12 LAB — LIPID PANEL
Cholesterol: 192 mg/dL (ref 0–200)
HDL: 46.1 mg/dL (ref >39.00)
Total CHOL/HDL Ratio: 4
Triglycerides: 530 mg/dL — ABNORMAL HIGH (ref 0.0–149.0)

## 2022-04-12 LAB — VITAMIN D 25 HYDROXY (VIT D DEFICIENCY, FRACTURES): VITD: 60.84 ng/mL (ref 30.00–100.00)

## 2022-04-12 LAB — MICROALBUMIN / CREATININE URINE RATIO
Creatinine,U: 47.4 mg/dL
Microalb Creat Ratio: 1.5 mg/g (ref 0.0–30.0)
Microalb, Ur: <0.7 mg/dL (ref 0.0–1.9)

## 2022-04-12 LAB — HEMOGLOBIN A1C: Hgb A1c MFr Bld: 7.2 % — ABNORMAL HIGH (ref 4.6–6.5)

## 2022-04-12 NOTE — Assessment & Plan Note (Signed)
Asymptomatic, continue to monitor.

## 2022-04-13 ENCOUNTER — Telehealth: Payer: Self-pay | Admitting: Family Medicine

## 2022-04-13 ENCOUNTER — Other Ambulatory Visit: Payer: Self-pay

## 2022-04-13 DIAGNOSIS — E785 Hyperlipidemia, unspecified: Secondary | ICD-10-CM

## 2022-04-13 DIAGNOSIS — I5022 Chronic systolic (congestive) heart failure: Secondary | ICD-10-CM

## 2022-04-13 DIAGNOSIS — E119 Type 2 diabetes mellitus without complications: Secondary | ICD-10-CM

## 2022-04-13 DIAGNOSIS — E559 Vitamin D deficiency, unspecified: Secondary | ICD-10-CM

## 2022-04-13 NOTE — Telephone Encounter (Signed)
Pt called to go over lab results.Please Advise.

## 2022-04-13 NOTE — Telephone Encounter (Signed)
Called pt was advised

## 2022-04-17 ENCOUNTER — Other Ambulatory Visit: Payer: Self-pay | Admitting: Family Medicine

## 2022-04-25 ENCOUNTER — Other Ambulatory Visit: Payer: Self-pay

## 2022-04-25 MED ORDER — IRBESARTAN 75 MG PO TABS: 75.0000 mg | ORAL_TABLET | Freq: Every day | ORAL | 3 refills | Status: DC

## 2022-05-09 ENCOUNTER — Other Ambulatory Visit: Payer: Self-pay

## 2022-05-11 ENCOUNTER — Other Ambulatory Visit: Payer: Self-pay

## 2022-05-11 ENCOUNTER — Telehealth: Payer: Self-pay | Admitting: Family Medicine

## 2022-05-11 MED ORDER — GLIMEPIRIDE 2 MG PO TABS: ORAL_TABLET | ORAL | 0 refills | Status: DC

## 2022-05-11 NOTE — Telephone Encounter (Signed)
Medication sent.

## 2022-05-11 NOTE — Telephone Encounter (Signed)
Prescription Request  05/11/2022  Is this a "Controlled Substance" medicine? No  LOV: 04/11/2022  What is the name of the medication or equipment?  glimepiride (AMARYL) 2 MG tablet   Have you contacted your pharmacy to request a refill? No   Which pharmacy would you like this sent to?  [id number] - [place], [place] - [address] [place] [address] Phone: [phone] Fax: [phone]    Patient notified that their request is being sent to the clinical staff for review and that they should receive a response within 2 business days.   Please advise at [facility] [phone]

## 2022-05-12 ENCOUNTER — Ambulatory Visit
Admission: RE | Admit: 2022-05-12 | Discharge: 2022-05-12 | Disposition: A | Discharge status: Home / Self Care | Payer: No Typology Code available for payment source | Source: Ambulatory Visit | Attending: Family Medicine | Admitting: Family Medicine

## 2022-05-12 DIAGNOSIS — Z1231 Encounter for screening mammogram for malignant neoplasm of breast: Secondary | ICD-10-CM

## 2022-05-19 ENCOUNTER — Other Ambulatory Visit: Payer: Self-pay | Admitting: Family Medicine

## 2022-05-20 ENCOUNTER — Other Ambulatory Visit: Payer: Self-pay | Admitting: Family Medicine

## 2022-05-23 ENCOUNTER — Other Ambulatory Visit: Payer: Self-pay

## 2022-05-23 MED ORDER — ATORVASTATIN CALCIUM 20 MG PO TABS: 20.0000 mg | ORAL_TABLET | Freq: Every day | ORAL | 0 refills | Status: DC

## 2022-06-01 ENCOUNTER — Other Ambulatory Visit: Payer: Self-pay | Admitting: Family Medicine

## 2022-06-07 ENCOUNTER — Telehealth: Payer: Self-pay | Admitting: Family Medicine

## 2022-06-07 NOTE — Telephone Encounter (Signed)
Patient called to say she is having issues with getting her Verio Test Strips filled at [facility]. Pt [facility] but unable to get anywhere and she is close to being out. Please send the prescription to  [facility]  [address] [place] [address] [phone]   Please call patient to advise when filled so she can go pick up.

## 2022-06-08 ENCOUNTER — Other Ambulatory Visit: Payer: Self-pay

## 2022-06-08 MED ORDER — ONETOUCH VERIO VI STRP: ORAL_STRIP | 4 refills | Status: DC

## 2022-06-08 MED ORDER — ONETOUCH VERIO VI STRP: ORAL_STRIP | 12 refills | Status: DC

## 2022-06-08 NOTE — Telephone Encounter (Signed)
Pharmacy states the prescription is missing how many times a day the patient is supposed to check her sugar. It cannot say as needed. Please advise.

## 2022-06-08 NOTE — Telephone Encounter (Signed)
Resent test strips. 

## 2022-06-08 NOTE — Telephone Encounter (Signed)
[facility]

## 2022-06-22 ENCOUNTER — Other Ambulatory Visit: Payer: Self-pay

## 2022-06-22 MED ORDER — ATORVASTATIN CALCIUM 20 MG PO TABS: 20.0000 mg | ORAL_TABLET | Freq: Every day | ORAL | 3 refills | Status: DC

## 2022-07-07 ENCOUNTER — Telehealth: Payer: Self-pay | Admitting: Family Medicine

## 2022-07-07 NOTE — Telephone Encounter (Signed)
Copied from CRM [id number]. Topic: Medicare AWV >> Jul 07, 2022  2:09 PM Payton Doughty wrote: Reason for CRM: LM 07/07/2022 to schedule AWV   Verlee Rossetti; Care Guide Ambulatory Clinical Support [place] [facility] Direct Dial: [phone]

## 2022-07-13 ENCOUNTER — Other Ambulatory Visit: Payer: Self-pay

## 2022-07-13 ENCOUNTER — Other Ambulatory Visit (INDEPENDENT_AMBULATORY_CARE_PROVIDER_SITE_OTHER): Payer: No Typology Code available for payment source

## 2022-07-13 DIAGNOSIS — R300 Dysuria: Secondary | ICD-10-CM

## 2022-07-13 DIAGNOSIS — E559 Vitamin D deficiency, unspecified: Secondary | ICD-10-CM

## 2022-07-13 DIAGNOSIS — E785 Hyperlipidemia, unspecified: Secondary | ICD-10-CM | POA: Diagnosis not present

## 2022-07-13 DIAGNOSIS — E119 Type 2 diabetes mellitus without complications: Secondary | ICD-10-CM

## 2022-07-13 DIAGNOSIS — I5022 Chronic systolic (congestive) heart failure: Secondary | ICD-10-CM

## 2022-07-13 LAB — COMPREHENSIVE METABOLIC PANEL
ALT: 49 U/L — ABNORMAL HIGH (ref 0–35)
AST: 41 U/L — ABNORMAL HIGH (ref 0–37)
Albumin: 3.9 g/dL (ref 3.5–5.2)
Alkaline Phosphatase: 82 U/L (ref 39–117)
BUN: 48 mg/dL — ABNORMAL HIGH (ref 6–23)
CO2: 23 mEq/L (ref 19–32)
Calcium: 9.9 mg/dL (ref 8.4–10.5)
Chloride: 101 mEq/L (ref 96–112)
Creatinine, Ser: 1.86 mg/dL — ABNORMAL HIGH (ref 0.40–1.20)
GFR: 25.5 mL/min — ABNORMAL LOW (ref >60.00)
Glucose, Bld: 285 mg/dL — ABNORMAL HIGH (ref 70–99)
Potassium: 5.1 mEq/L (ref 3.5–5.1)
Sodium: 134 mEq/L — ABNORMAL LOW (ref 135–145)
Total Bilirubin: 1.4 mg/dL — ABNORMAL HIGH (ref 0.2–1.2)
Total Protein: 7 g/dL (ref 6.0–8.3)

## 2022-07-13 LAB — CBC WITH DIFFERENTIAL/PLATELET
Basophils Absolute: 0 10*3/uL (ref 0.0–0.1)
Basophils Relative: 0.3 % (ref 0.0–3.0)
Eosinophils Absolute: 0 10*3/uL (ref 0.0–0.7)
Eosinophils Relative: 0.1 % (ref 0.0–5.0)
HCT: 38.1 % (ref 36.0–46.0)
Hemoglobin: 12.2 g/dL (ref 12.0–15.0)
Lymphocytes Relative: 5.3 % — ABNORMAL LOW (ref 12.0–46.0)
Lymphs Abs: 0.7 10*3/uL (ref 0.7–4.0)
MCHC: 31.9 g/dL (ref 30.0–36.0)
MCV: 100.8 fl — ABNORMAL HIGH (ref 78.0–100.0)
Monocytes Absolute: 0.9 10*3/uL (ref 0.1–1.0)
Monocytes Relative: 6.4 % (ref 3.0–12.0)
Neutro Abs: 11.9 10*3/uL — ABNORMAL HIGH (ref 1.4–7.7)
Neutrophils Relative %: 87.9 % — ABNORMAL HIGH (ref 43.0–77.0)
Platelets: 164 10*3/uL (ref 150.0–400.0)
RBC: 3.78 Mil/uL — ABNORMAL LOW (ref 3.87–5.11)
RDW: 15.9 % — ABNORMAL HIGH (ref 11.5–15.5)
WBC: 13.6 10*3/uL — ABNORMAL HIGH (ref 4.0–10.5)

## 2022-07-13 LAB — LIPID PANEL
Cholesterol: 159 mg/dL (ref 0–200)
HDL: 48.8 mg/dL (ref >39.00)
LDL Cholesterol: 75 mg/dL (ref 0–99)
NonHDL: 109.98
Total CHOL/HDL Ratio: 3
Triglycerides: 175 mg/dL — ABNORMAL HIGH (ref 0.0–149.0)
VLDL: 35 mg/dL (ref 0.0–40.0)

## 2022-07-13 LAB — POCT URINALYSIS DIP (MANUAL ENTRY)
Bilirubin, UA: NEGATIVE
Glucose, UA: NEGATIVE mg/dL
Ketones, POC UA: NEGATIVE mg/dL
Nitrite, UA: POSITIVE — AB
Protein Ur, POC: 30 mg/dL — AB
Spec Grav, UA: 1.015 (ref 1.010–1.025)
Urobilinogen, UA: 0.2 E.U./dL
pH, UA: 5 (ref 5.0–8.0)

## 2022-07-13 LAB — VITAMIN D 25 HYDROXY (VIT D DEFICIENCY, FRACTURES): VITD: 91.45 ng/mL (ref 30.00–100.00)

## 2022-07-13 LAB — TSH: TSH: 0.71 u[IU]/mL (ref 0.35–5.50)

## 2022-07-13 LAB — HEMOGLOBIN A1C: Hgb A1c MFr Bld: 7.4 % — ABNORMAL HIGH (ref 4.6–6.5)

## 2022-07-13 MED ORDER — CEFDINIR 300 MG PO CAPS: 300.0000 mg | ORAL_CAPSULE | Freq: Two times a day (BID) | ORAL | 0 refills | Status: DC

## 2022-07-13 NOTE — Addendum Note (Signed)
Addended by: Rosita Kea on: 07/13/2022 11:24 AM   Modules accepted: Orders

## 2022-07-13 NOTE — Addendum Note (Signed)
Addended by: Rosita Kea on: 07/13/2022 11:16 AM   Modules accepted: Orders

## 2022-07-13 NOTE — Addendum Note (Signed)
Addended by: Judieth Keens on: 07/13/2022 01:13 PM   Modules accepted: Orders

## 2022-07-15 LAB — URINE CULTURE
MICRO NUMBER:: 15078893
SPECIMEN QUALITY:: ADEQUATE

## 2022-07-21 ENCOUNTER — Telehealth: Payer: Self-pay | Admitting: Family Medicine

## 2022-07-21 NOTE — Telephone Encounter (Signed)
Copied from CRM [id number]. Topic: Medicare AWV >> Jul 21, 2022 10:39 AM Payton Doughty wrote: Reason for CRM: LM 07/21/2022 to schedule AWV   Verlee Rossetti; Care Guide Ambulatory Clinical Support [place] [facility] Direct Dial: [phone]

## 2022-08-04 DIAGNOSIS — J189 Pneumonia, unspecified organism: Secondary | ICD-10-CM | POA: Diagnosis not present

## 2022-08-04 DIAGNOSIS — Z03818 Encounter for observation for suspected exposure to other biological agents ruled out: Secondary | ICD-10-CM | POA: Diagnosis not present

## 2022-08-04 DIAGNOSIS — R051 Acute cough: Secondary | ICD-10-CM | POA: Diagnosis not present

## 2022-08-04 DIAGNOSIS — R509 Fever, unspecified: Secondary | ICD-10-CM | POA: Diagnosis not present

## 2022-08-04 DIAGNOSIS — R110 Nausea: Secondary | ICD-10-CM | POA: Diagnosis not present

## 2022-08-12 DIAGNOSIS — N179 Acute kidney failure, unspecified: Secondary | ICD-10-CM | POA: Diagnosis not present

## 2022-08-12 DIAGNOSIS — J9601 Acute respiratory failure with hypoxia: Secondary | ICD-10-CM | POA: Diagnosis not present

## 2022-08-12 DIAGNOSIS — R0602 Shortness of breath: Secondary | ICD-10-CM | POA: Diagnosis not present

## 2022-08-12 DIAGNOSIS — R918 Other nonspecific abnormal finding of lung field: Secondary | ICD-10-CM | POA: Diagnosis not present

## 2022-08-12 DIAGNOSIS — I2510 Atherosclerotic heart disease of native coronary artery without angina pectoris: Secondary | ICD-10-CM | POA: Diagnosis not present

## 2022-08-12 DIAGNOSIS — E1165 Type 2 diabetes mellitus with hyperglycemia: Secondary | ICD-10-CM | POA: Diagnosis not present

## 2022-08-13 DIAGNOSIS — I447 Left bundle-branch block, unspecified: Secondary | ICD-10-CM | POA: Diagnosis not present

## 2022-08-13 DIAGNOSIS — E1165 Type 2 diabetes mellitus with hyperglycemia: Secondary | ICD-10-CM | POA: Diagnosis not present

## 2022-08-13 DIAGNOSIS — Z6835 Body mass index (BMI) 35.0-35.9, adult: Secondary | ICD-10-CM | POA: Diagnosis not present

## 2022-08-13 DIAGNOSIS — N179 Acute kidney failure, unspecified: Secondary | ICD-10-CM | POA: Diagnosis not present

## 2022-08-13 DIAGNOSIS — R918 Other nonspecific abnormal finding of lung field: Secondary | ICD-10-CM | POA: Diagnosis not present

## 2022-08-13 DIAGNOSIS — Z794 Long term (current) use of insulin: Secondary | ICD-10-CM | POA: Diagnosis not present

## 2022-08-13 DIAGNOSIS — Z7982 Long term (current) use of aspirin: Secondary | ICD-10-CM | POA: Diagnosis not present

## 2022-08-13 DIAGNOSIS — Z79899 Other long term (current) drug therapy: Secondary | ICD-10-CM | POA: Diagnosis not present

## 2022-08-13 DIAGNOSIS — J9602 Acute respiratory failure with hypercapnia: Secondary | ICD-10-CM | POA: Diagnosis not present

## 2022-08-13 DIAGNOSIS — J9601 Acute respiratory failure with hypoxia: Secondary | ICD-10-CM | POA: Diagnosis not present

## 2022-08-13 DIAGNOSIS — Z85528 Personal history of other malignant neoplasm of kidney: Secondary | ICD-10-CM | POA: Diagnosis not present

## 2022-08-13 DIAGNOSIS — R0602 Shortness of breath: Secondary | ICD-10-CM | POA: Diagnosis not present

## 2022-08-13 DIAGNOSIS — N184 Chronic kidney disease, stage 4 (severe): Secondary | ICD-10-CM | POA: Diagnosis not present

## 2022-08-13 DIAGNOSIS — K7460 Unspecified cirrhosis of liver: Secondary | ICD-10-CM | POA: Diagnosis not present

## 2022-08-13 DIAGNOSIS — E669 Obesity, unspecified: Secondary | ICD-10-CM | POA: Diagnosis not present

## 2022-08-13 DIAGNOSIS — I129 Hypertensive chronic kidney disease with stage 1 through stage 4 chronic kidney disease, or unspecified chronic kidney disease: Secondary | ICD-10-CM | POA: Diagnosis not present

## 2022-08-13 DIAGNOSIS — E785 Hyperlipidemia, unspecified: Secondary | ICD-10-CM | POA: Diagnosis not present

## 2022-08-13 DIAGNOSIS — N390 Urinary tract infection, site not specified: Secondary | ICD-10-CM | POA: Diagnosis not present

## 2022-08-13 DIAGNOSIS — R791 Abnormal coagulation profile: Secondary | ICD-10-CM | POA: Diagnosis not present

## 2022-08-13 DIAGNOSIS — E1122 Type 2 diabetes mellitus with diabetic chronic kidney disease: Secondary | ICD-10-CM | POA: Diagnosis not present

## 2022-08-13 DIAGNOSIS — I2510 Atherosclerotic heart disease of native coronary artery without angina pectoris: Secondary | ICD-10-CM | POA: Diagnosis not present

## 2022-08-14 DIAGNOSIS — J439 Emphysema, unspecified: Secondary | ICD-10-CM | POA: Diagnosis not present

## 2022-08-14 DIAGNOSIS — R0602 Shortness of breath: Secondary | ICD-10-CM | POA: Diagnosis not present

## 2022-08-14 DIAGNOSIS — R911 Solitary pulmonary nodule: Secondary | ICD-10-CM | POA: Diagnosis not present

## 2022-08-14 DIAGNOSIS — I2510 Atherosclerotic heart disease of native coronary artery without angina pectoris: Secondary | ICD-10-CM | POA: Diagnosis not present

## 2022-08-14 DIAGNOSIS — I071 Rheumatic tricuspid insufficiency: Secondary | ICD-10-CM | POA: Diagnosis not present

## 2022-08-16 ENCOUNTER — Telehealth: Payer: Self-pay

## 2022-08-16 NOTE — Transitions of Care (Post Inpatient/ED Visit) (Signed)
   08/16/2022  Name: Amanda Crawford MRN: [id number] DOB: [birth year]  Today's TOC FU Call Status: Today's TOC FU Call Status:: Unsuccessul Call (1st Attempt) Unsuccessful Call (1st Attempt) Date: 08/16/22  Attempted to reach the patient regarding the most recent Inpatient/ED visit.  Follow Up Plan: Additional outreach attempts will be made to reach the patient to complete the Transitions of Care (Post Inpatient/ED visit) call.     Antionette Fairy, RN,BSN,CCM [facility] Care Management Care Management Community Coordinator Direct Phone: [phone] Toll Free: [phone] Fax: [phone]

## 2022-08-17 ENCOUNTER — Telehealth: Payer: Self-pay

## 2022-08-17 NOTE — Transitions of Care (Post Inpatient/ED Visit) (Signed)
08/17/2022  Name: Amanda Crawford MRN: [id number] DOB: 03/19/1945  Today's TOC FU Call Status: Today's TOC FU Call Status:: Successful TOC FU Call Competed TOC FU Call Complete Date: 08/17/22  Transition Care Management Follow-up Telephone Call Date of Discharge: 08/15/22 Discharge Facility: Other ([facility]) Name of Other ([facility]) Discharge Facility: [facility] Type of Discharge: Inpatient Admission Primary Inpatient Discharge Diagnosis:: 'acute resp failure with hypoxia" How have you been since you were released from the hospital?: Better (Pt voices she is "getting strength back & feeling stronger." She reports some SOB w / exertion at times-rests and it goes away. Appetite good. BM today. Blood sugar yest was 135.) Any questions or concerns?: Yes Patient Questions/Concerns:: Pt wants to talk with PCP about being told she has non-alcoholic liver cirrhosis while in the hospital and needing to see GI MD-wants to see [facility] [facility] and not the one hospital referred her to in [place].Also,wants to talk w/ PCP about seeing if she qualifies for home oxygen-thinks it might help her Patient Questions/Concerns Addressed: Other:, Notified Provider of Patient Questions/Concerns  Items Reviewed: Did you receive and understand the discharge instructions provided?: Yes Medications obtained,verified, and reconciled?: Yes (Medications Reviewed) Any new allergies since your discharge?: No Dietary orders reviewed?: Yes Type of Diet Ordered:: low salt/heart healthy/carb modified Do you have support at home?: Yes People in Home: spouse Name of Support/Comfort Primary Source: [name]  Medications Reviewed Today: Medications Reviewed Today     Reviewed by Charlyn Minerva, RN (Registered Nurse) on 08/17/22 at 1018  Med List Status: <None>   Medication Order Taking? Sig Documenting Provider Last Dose Status Informant  allopurinol (ZYLOPRIM) 300 MG tablet [id number] Yes Take 1 tablet (300  mg total) by mouth daily. [name], MD Taking Active   aspirin EC 81 MG tablet [id number] Yes Take 81 mg by mouth daily. [provider] Taking Active Self  atorvastatin (LIPITOR) 20 MG tablet [id number] Yes Take 1 tablet (20 mg total) by mouth daily. [name], MD Taking Active   BD PEN NEEDLE NANO 2ND GEN 32G X 4 MM MISC [id number]  USE AS DIRECTED WITH LANTUS ONCE A DAY. [name], MD  Active   Calcium Carb-Cholecalciferol (CALCIUM 600 + D PO) [id number] Yes Take 1 tablet by mouth daily. [provider] Taking Active   carvedilol (COREG) 3.125 MG tablet [id number] Yes Take 1 tablet (3.125 mg total) by mouth 2 (two) times daily with a meal. [name], MD Taking Active   cefdinir (OMNICEF) 300 MG capsule [id number] Yes Take 1 capsule (300 mg total) by mouth 2 (two) times daily.  Patient taking differently: Take 300 mg by mouth 2 (two) times daily. for 3 days., Starting Tue 08/15/2022, Until Fri 08/18/2022   [name], MD Taking Active Self  diphenhydramine-acetaminophen (TYLENOL PM) 25-500 MG TABS [id number] Yes Take 1 tablet by mouth at bedtime as needed (sleep).  [provider] Taking Active Self  furosemide (LASIX) 40 MG tablet [id number] Yes TAKE 1 TABLET BY MOUTH EVERY DAY Hilty, Lisette Abu, MD Taking Active   glimepiride (AMARYL) 2 MG tablet [id number] Yes TAKE 1 TABLET BY MOUTH EVERY DAY WITH BREAKFAST [name], MD Taking Active   glucose blood (ONETOUCH VERIO) test strip [id number] Yes Check Blood Sugar 3 times daily. [name], MD Taking Active     Discontinued 03/25/19 1548   hyoscyamine (LEVSIN SL) 0.125 MG SL tablet [id number]  Place 1 tablet (0.125 mg total) under  the tongue every 6 (six) hours as needed. [name], MD  Active   insulin glargine (LANTUS SOLOSTAR) 100 UNIT/ML Solostar Pen [id number] Yes INJECT 10 UNITS INTO THE SKIN DAILY  Patient taking differently: Inject 10 Units into the skin daily. Pt taking 12 units QHS    [name], MD Taking Active Self  irbesartan (AVAPRO) 75 MG tablet [id number] Yes Take 1 tablet (75 mg total) by mouth daily. [name], MD Taking Active   Lancets [facility] DELICA PLUS [place]) MISC [id number] Yes USE AS DIRECTED TO TEST DAILY [name], MD Taking Active   meclizine (ANTIVERT) 12.5 MG tablet [id number]  Take 1 tablet (12.5 mg total) by mouth 3 (three) times daily as needed for dizziness. As needed for   Dizziness or nausea [name], MD  Active Self           Med Note Willa Rough, Aldona Bar Feb 23, 2022 10:59 AM)    nystatin cream (MYCOSTATIN) [id number] Yes Apply 1 application topically 2 (two) times daily. [name], MD Taking Active   Vitamin D, Ergocalciferol, (DRISDOL) 1.25 MG (50000 UNIT) CAPS capsule [id number] Yes TAKE 1 CAPSULE (50,000 UNITS TOTAL) BY MOUTH EVERY 7 (SEVEN) DAYS [name], MD Taking Active             Home Care and Equipment/Supplies: Were Home Health Services Ordered?: NA Any new equipment or medical supplies ordered?: NA  Functional Questionnaire: Do you need assistance with bathing/showering or dressing?: No Do you need assistance with meal preparation?: No Do you need assistance with eating?: No Do you have difficulty maintaining continence: No Do you need assistance with getting out of bed/getting out of a chair/moving?: No Do you have difficulty managing or taking your medications?: No  Follow up appointments reviewed: PCP Follow-up appointment confirmed?: Yes Date of PCP follow-up appointment?: 08/22/22 Follow-up Provider: Dr. Abner Greenspan Specialist [facility] Follow-up appointment confirmed?: No Reason Specialist Follow-Up Not Confirmed: Patient has Specialist Provider Number and will Call for Appointment (Reviewed with pt info to GI MD in [facility] on d/c paperwork-she will discuss with PCP about GI referral to [place]) Do you need transportation to your follow-up appointment?: No (pt voices spouse  will take her) Do you understand care options if your condition(s) worsen?: Yes-patient verbalized understanding   TOC Interventions Today    Flowsheet Row Most Recent Value  TOC Interventions   TOC Interventions Discussed/Reviewed TOC Interventions Discussed, Arranged PCP follow up less than 12 days/Care Guide scheduled, Contacted provider for patient needs      Interventions Today    Flowsheet Row Most Recent Value  General Interventions   General Interventions Discussed/Reviewed General Interventions Discussed, Doctor Visits, Durable Medical Equipment (DME)  Doctor Visits Discussed/Reviewed Doctor Visits Discussed, Specialist, PCP  Durable Medical Equipment (DME) Glucomoter, Oxygen  PCP/Specialist Visits Compliance with follow-up visit  Education Interventions   Education Provided Provided Education  Provided Verbal Education On Nutrition, When to see the doctor, Medication, Blood Sugar Monitoring, Other  [resp sx mgmt]  Nutrition Interventions   Nutrition Discussed/Reviewed Nutrition Discussed, Adding fruits and vegetables, Decreasing fats, Decreasing sugar intake, Decreasing salt, Increasing proteins  Pharmacy Interventions   Pharmacy Dicussed/Reviewed Pharmacy Topics Discussed, Medications and their functions  Safety Interventions   Safety Discussed/Reviewed Safety Discussed       [facility] Care Management Care Management Community Coordinator Direct Phone: [phone] Toll Free: [phone] Fax: [phone]

## 2022-08-22 ENCOUNTER — Ambulatory Visit: Payer: No Typology Code available for payment source | Admitting: Family Medicine

## 2022-08-22 VITALS — BP 112/62 | HR 92 | Temp 97.8°F | Resp 16 | Ht 64.0 in | Wt 209.8 lb

## 2022-08-22 DIAGNOSIS — J189 Pneumonia, unspecified organism: Secondary | ICD-10-CM | POA: Diagnosis not present

## 2022-08-22 DIAGNOSIS — E559 Vitamin D deficiency, unspecified: Secondary | ICD-10-CM

## 2022-08-22 DIAGNOSIS — J40 Bronchitis, not specified as acute or chronic: Secondary | ICD-10-CM

## 2022-08-22 DIAGNOSIS — K5792 Diverticulitis of intestine, part unspecified, without perforation or abscess without bleeding: Secondary | ICD-10-CM | POA: Diagnosis not present

## 2022-08-22 DIAGNOSIS — N289 Disorder of kidney and ureter, unspecified: Secondary | ICD-10-CM | POA: Diagnosis not present

## 2022-08-22 DIAGNOSIS — R5383 Other fatigue: Secondary | ICD-10-CM

## 2022-08-22 DIAGNOSIS — R0683 Snoring: Secondary | ICD-10-CM

## 2022-08-22 DIAGNOSIS — E875 Hyperkalemia: Secondary | ICD-10-CM

## 2022-08-22 DIAGNOSIS — I509 Heart failure, unspecified: Secondary | ICD-10-CM

## 2022-08-22 DIAGNOSIS — C649 Malignant neoplasm of unspecified kidney, except renal pelvis: Secondary | ICD-10-CM | POA: Diagnosis not present

## 2022-08-22 DIAGNOSIS — N390 Urinary tract infection, site not specified: Secondary | ICD-10-CM

## 2022-08-22 DIAGNOSIS — I10 Essential (primary) hypertension: Secondary | ICD-10-CM | POA: Diagnosis not present

## 2022-08-22 DIAGNOSIS — M1A9XX0 Chronic gout, unspecified, without tophus (tophi): Secondary | ICD-10-CM | POA: Diagnosis not present

## 2022-08-22 DIAGNOSIS — G479 Sleep disorder, unspecified: Secondary | ICD-10-CM

## 2022-08-22 DIAGNOSIS — R197 Diarrhea, unspecified: Secondary | ICD-10-CM

## 2022-08-22 DIAGNOSIS — E119 Type 2 diabetes mellitus without complications: Secondary | ICD-10-CM | POA: Diagnosis not present

## 2022-08-22 DIAGNOSIS — I5022 Chronic systolic (congestive) heart failure: Secondary | ICD-10-CM | POA: Diagnosis not present

## 2022-08-22 DIAGNOSIS — I428 Other cardiomyopathies: Secondary | ICD-10-CM

## 2022-08-22 NOTE — Patient Instructions (Addendum)
C. Diff Infection C. diffinfection, or C. diff, is an infection that is caused by C. diff germs. These germs, called bacteria, cause watery poop (diarrhea) and very bad inflammation in the colon. This infection often happens after taking antibiotics. C. diffcan spread easily to others. What are the causes? Taking certain antibiotics. Coming in contact with people, food, or things that have the germ on it. What increases the risk? Taking certain antibiotics for a long time. Staying in a hospital or nursing home for a long time. Being age 17 or older. Having had C. diff infection before or been exposed to C. diff. Having a weak disease-fighting, or immune, system. Having serious health problems, including: Colon cancer. Inflammatory bowel disease (IBD). Taking medicines that treat stomach acid. Having had surgery on your digestive system. What are the signs or symptoms? Watery poop. Fever. Feeling like you may vomit. Not feeling hungry. Swelling, pain, cramps, or a tender belly. How is this treated? Treatment may include: Stopping the antibiotics that caused the C. diff infection. Taking antibiotics that kill C. diff. Placing poop from a healthy person into your colon. This is called a fecal transplant. Having surgery to take out the infected part of the colon. This is rare. Follow these instructions at home: Medicines Take over-the-counter and prescription medicines only as told by your doctor. Take your antibiotics as told by your doctor. Do not stop taking them even if you start to feel better. Do not take medicines that treat watery poop unless your doctor tells you to. Eating and drinking Follow instructions from your  doctor about what you may eat and drink. Eat bland foods in small amounts, such as: Bananas. Applesauce. Rice. Toast. Avoid milk, caffeine, and alcohol. To prevent loss of fluid in your body, or dehydration: Drink clear fluids to keep your pee (urine) pale yellow. This includes water, ice chips, clear fruit juice with water added to it, or low-calorie sports drinks. Have a drink called an oral rehydration solution (ORS). You can buy this drink at pharmacies and retail stores. General instructions Wash your hands often with soap and water. Do this for at least 20 seconds. Take a bath or shower every day. Return to your normal activities when your doctor says that it's safe. Be sure your home is clean before you leave the hospital or clinic. Clean every day for at least a week. Keep all follow-up visits to make sure your infection is gone. How is this prevented? Wash [employer] your hands often with soap and water for at least 20 seconds. Wash your hands before you cook and after you use the bathroom. Other people should wash their hands too, especially: People who live with you. People who visit you  in a hospital or clinic. Stop germs from spreading Tell your doctor if you get watery poop while you are in the hospital or nursing home. When you visit someone in the hospital or nursing home, wear a gown, gloves, or other protection. Try to: Stay away from people who have watery poop. Use a different bathroom if you're sick and live with other people. Clean surfaces Clean surfaces that you touch every day. Use a product that has a 10% chlorine bleach solution. Be sure to: Read the product label to make sure that the product will kill the germs on your surfaces. Clean toilets and flush handles, bathtubs, sinks, doorknobs and handles, countertops, and work surfaces. If you're in the hospital, make sure the surfaces in your room are cleaned each day. Tell someone right away if body fluids  have splashed or spilled. Wash clothes and linens Wash clothes and linens using laundry soap that has chlorine bleach. Be sure to: Use powder soap instead of liquid. Clean your washing machine once a month. To do this, turn on the hot setting with only soap in it. Contact a doctor if: Your symptoms do not get better or get worse. Your symptoms go away and then come back. You have a fever. You have new symptoms. Get help right away if: Your belly is more tender or painful. Your poop is bloody. Your poop looks black. You vomit every time you eat or drink. You have signs of not having enough fluids in your body. These include: Dark yellow pee, very little pee, or no pee. Cracked lips or dry mouth. No tears when you cry. Sunken eyes. Feeling tired. Feeling weak or dizzy. This information is not intended to replace advice given to you by your health care provider. Make sure you discuss any questions you have with your health care provider. Document Revised: 04/10/2022 Document Reviewed: 04/10/2022 Elsevier Patient Education  2024 [employer].

## 2022-08-23 ENCOUNTER — Encounter: Payer: Self-pay | Admitting: Family Medicine

## 2022-08-23 ENCOUNTER — Other Ambulatory Visit: Payer: Self-pay

## 2022-08-23 DIAGNOSIS — J189 Pneumonia, unspecified organism: Secondary | ICD-10-CM

## 2022-08-23 DIAGNOSIS — N289 Disorder of kidney and ureter, unspecified: Secondary | ICD-10-CM

## 2022-08-23 DIAGNOSIS — D649 Anemia, unspecified: Secondary | ICD-10-CM

## 2022-08-23 LAB — CBC WITH DIFFERENTIAL/PLATELET
Basophils Absolute: 0.1 10*3/uL (ref 0.0–0.1)
Basophils Relative: 0.8 % (ref 0.0–3.0)
Eosinophils Absolute: 0.2 10*3/uL (ref 0.0–0.7)
Eosinophils Relative: 2.5 % (ref 0.0–5.0)
HCT: 35.3 % — ABNORMAL LOW (ref 36.0–46.0)
Hemoglobin: 11.4 g/dL — ABNORMAL LOW (ref 12.0–15.0)
Lymphocytes Relative: 27.5 % (ref 12.0–46.0)
Lymphs Abs: 2.2 10*3/uL (ref 0.7–4.0)
MCHC: 32.3 g/dL (ref 30.0–36.0)
MCV: 100.1 fl — ABNORMAL HIGH (ref 78.0–100.0)
Monocytes Absolute: 0.5 10*3/uL (ref 0.1–1.0)
Monocytes Relative: 6.4 % (ref 3.0–12.0)
Neutro Abs: 5 10*3/uL (ref 1.4–7.7)
Neutrophils Relative %: 62.8 % (ref 43.0–77.0)
Platelets: 184 10*3/uL (ref 150.0–400.0)
RBC: 3.53 Mil/uL — ABNORMAL LOW (ref 3.87–5.11)
RDW: 16.3 % — ABNORMAL HIGH (ref 11.5–15.5)
WBC: 8 10*3/uL (ref 4.0–10.5)

## 2022-08-23 LAB — URINALYSIS, ROUTINE W REFLEX MICROSCOPIC
Bilirubin Urine: NEGATIVE
Hgb urine dipstick: NEGATIVE
Ketones, ur: NEGATIVE
Leukocytes,Ua: NEGATIVE
Nitrite: NEGATIVE
Specific Gravity, Urine: 1.015 (ref 1.000–1.030)
Total Protein, Urine: NEGATIVE
Urine Glucose: NEGATIVE
Urobilinogen, UA: 0.2 (ref 0.0–1.0)
pH: 5.5 (ref 5.0–8.0)

## 2022-08-23 LAB — COMPREHENSIVE METABOLIC PANEL
ALT: 39 U/L — ABNORMAL HIGH (ref 0–35)
AST: 46 U/L — ABNORMAL HIGH (ref 0–37)
Albumin: 4 g/dL (ref 3.5–5.2)
Alkaline Phosphatase: 74 U/L (ref 39–117)
BUN: 37 mg/dL — ABNORMAL HIGH (ref 6–23)
CO2: 25 mEq/L (ref 19–32)
Calcium: 10.1 mg/dL (ref 8.4–10.5)
Chloride: 106 mEq/L (ref 96–112)
Creatinine, Ser: 2.07 mg/dL — ABNORMAL HIGH (ref 0.40–1.20)
GFR: 22.41 mL/min — ABNORMAL LOW (ref >60.00)
Glucose, Bld: 86 mg/dL (ref 70–99)
Potassium: 5 mEq/L (ref 3.5–5.1)
Sodium: 141 mEq/L (ref 135–145)
Total Bilirubin: 0.9 mg/dL (ref 0.2–1.2)
Total Protein: 6.8 g/dL (ref 6.0–8.3)

## 2022-08-23 LAB — URINE CULTURE
MICRO NUMBER:: 15235317
SPECIMEN QUALITY:: ADEQUATE

## 2022-08-23 NOTE — Assessment & Plan Note (Signed)
No recent exacerbation 

## 2022-08-23 NOTE — Assessment & Plan Note (Signed)
hgba1c acceptable, minimize simple carbs. Increase exercise as tolerated. Continue current meds 

## 2022-08-23 NOTE — Assessment & Plan Note (Signed)
Well controlled, no changes to meds. Encouraged heart healthy diet such as the DASH diet and exercise as tolerated. Well controlled, no changes to meds. Encouraged heart healthy diet such as the DASH diet and exercise as tolerated.

## 2022-08-23 NOTE — Assessment & Plan Note (Signed)
Has just one kidney

## 2022-08-23 NOTE — Assessment & Plan Note (Signed)
Hydrate and monitor 

## 2022-08-23 NOTE — Progress Notes (Signed)
Subjective:    Patient ID: Amanda Crawford, female    DOB: 1945/11/30, 77 y.o.   MRN: [id number]  Chief Complaint  Patient presents with  . Hospitalization Follow-up    Follow up    HPI Discussed the use of AI scribe software for clinical note transcription with the patient, who gave verbal consent to proceed.  History of Present Illness   The patient, with a complex medical history including lung surgery, kidney removal, and cirrhosis of the liver, presents with shortness of breath, fatigue, loose bowel movements, and urinary urgency. The patient reports feeling weak and dizzy, with difficulty breathing. They also report frequent bowel movements, describing them as very loose and occurring three to four times a day. The patient also experiences urinary urgency, particularly after standing up from a seated position.  The patient has a history of smoking, having started at a young age and quitting in 2008. They underwent lung surgery twice, in 2008 and 2014, and had a kidney removed. They were also diagnosed with cirrhosis of the liver, which was discovered during a CT scan. The patient reports a history of diverticulitis and is currently experiencing symptoms consistent with this condition.  The patient also reports experiencing heavy snoring and excessive fatigue, which may suggest sleep apnea. They have been sleeping in a recliner and report frequent napping during the day. The patient's spouse confirms the heavy snoring and daytime sleeping.  The patient has been trying to manage their symptoms at home, including using a treadmill for exercise, although it recently broke. They report a recent episode of pneumonia, treated with antibiotics, which left them feeling weak and experiencing sweats. The patient also reports a painful spot on their leg, which has been draining.        Past Medical History:  Diagnosis Date  . Allergic state 03/12/2015  . Arthritis   . Colon polyp 11/19/2014   . Dermatitis 03/12/2015  . Diarrhea 06/29/2016  . Diverticulitis   . DM (diabetes mellitus), type 2 (HCC) 06/29/2016  . GERD (gastroesophageal reflux disease)    occ  . Gout 03/12/2015  . H/O measles   . H/O mumps   . Headache(784.0)    migraines  . History of chicken pox 11/23/2014  . Hyperglycemia 06/29/2016  . Lung cancer (HCC)   . Migraine 11/23/2014  . Obesity 11/23/2014  . Pneumonia    child  . Preventative health care 09/12/2015  . Primary hypertension 12/15/2020  . Renal cell cancer (HCC)    renal cell ca dx 9/01 and 8/08;  . Renal insufficiency   . Shortness of breath    occ  . Skin lesion of breast 03/12/2015    Past Surgical History:  Procedure Laterality Date  . ABDOMINAL HYSTERECTOMY    . ANAL RECTAL MANOMETRY N/A 06/29/2021   Procedure: ANO RECTAL MANOMETRY;  Surgeon: Shellia Cleverly, DO;  Location: [facility];  Service: Gastroenterology;  Laterality: N/A;  . APPENDECTOMY    . CARDIAC CATHETERIZATION     yrs ago neg  . CHOLECYSTECTOMY    . COLONOSCOPY  2018  . ESOPHAGOGASTRODUODENOSCOPY     years ago  . IR RADIOLOGIST EVAL & MGMT  07/17/2018  . IR RADIOLOGIST EVAL & MGMT  09/12/2018  . IR RADIOLOGIST EVAL & MGMT  06/18/2019  . IR RADIOLOGIST EVAL & MGMT  01/13/2020  . IR RADIOLOGIST EVAL & MGMT  07/08/2020  . IR RADIOLOGIST EVAL & MGMT  10/12/2020  . KIDNEY SURGERY  2001  Removed of left kidney   . LUNG CANCER SURGERY Left 08  . RADIOLOGY WITH ANESTHESIA N/A 08/21/2018   Procedure: CT WITH ANESTHESIA RENAL CRYOABLATION;  Surgeon: Oley Balm, MD;  Location: [facility];  Service: Radiology;  Laterality: N/A;  . RENAL MASS EXCISION Left 01  . RIGHT/LEFT HEART CATH AND CORONARY ANGIOGRAPHY N/A 07/27/2016   Procedure: Right/Left Heart Cath and Coronary Angiography;  Surgeon: Marykay Lex, MD;  Location: [facility] INVASIVE CV LAB;  Service: Cardiovascular;  Laterality: N/A;  . THORACOTOMY Left 05/09/2012   Procedure: THORACOTOMY MAJOR;  Surgeon: Alleen Borne, MD;   Location: [facility] OR;  Service: Thoracic;  Laterality: Left;  Marland Kitchen VIDEO BRONCHOSCOPY N/A 05/09/2012   Procedure: VIDEO BRONCHOSCOPY;  Surgeon: Alleen Borne, MD;  Location: [facility] OR;  Service: Thoracic;  Laterality: N/A;  . WEDGE RESECTION Left 05/09/2012   Procedure: LEFT UPPER LOBE WEDGE RESECTION;  Surgeon: Alleen Borne, MD;  Location: [facility] OR;  Service: Thoracic;  Laterality: Left;    Family History  Problem Relation Age of Onset  . Dementia Mother   . Heart failure Father   . Diabetes Father   . Hyperlipidemia Father   . Heart disease Father   . Hypertension Father   . Asthma Brother   . Asthma Daughter   . Alcohol abuse Maternal Aunt   . Colon cancer Paternal Aunt   . Cancer Paternal Grandmother   . Hearing loss Paternal Grandfather   . Stroke Paternal Grandfather     Social History   Socioeconomic History  . Marital status: Married    Spouse name: Not on file  . Number of children: Not on file  . Years of education: Not on file  . Highest education level: Not on file  Occupational History  . Occupation: retired  Tobacco Use  . Smoking status: Former    Current packs/day: 0.00    Average packs/day: 0.5 packs/day for 35.0 years (17.5 ttl pk-yrs)    Types: Cigarettes    Start date: 05/08/1971    Quit date: 05/08/2006    Years since quitting: 16.3  . Smokeless tobacco: Never  Vaping Use  . Vaping status: Never Used  Substance and Sexual Activity  . Alcohol use: Yes    Comment: seldomly  . Drug use: No  . Sexual activity: Not on file    Comment: lives with husband, no dietary restrictions.   Other Topics Concern  . Not on file  Social History Narrative  . Not on file   Social Determinants of Health   Financial Resource Strain: Low Risk  (07/05/2020)   Overall Financial Resource Strain (CARDIA)   . Difficulty of Paying Living Expenses: Not hard at all  Food Insecurity: No Food Insecurity (08/12/2022)   [facility]   Hunger Vital Sign   . Worried About  Programme researcher, broadcasting/film/video in the Last Year: Never true   . Ran Out of Food in the Last Year: Never true  Transportation Needs: No Transportation Needs (08/12/2022)   Received from [facility] - Transportation   . Lack of Transportation (Medical): No   . Lack of Transportation (Non-Medical): No  Physical Activity: Inactive (07/05/2020)   Exercise Vital Sign   . Days of Exercise per Week: 0 days   . Minutes of Exercise per Session: 0 min  Stress: No Stress Concern Present (08/12/2022)   Received from [facility] - Occupational Stress Questionnaire   .  Feeling of Stress : Not at all  Social Connections: Unknown (06/06/2021)   Received from [facility]   Social Network   . Social Network: Not on file  Intimate Partner Violence: Not At Risk (08/12/2022)   Received from [facility]   HITS   . Over the last 12 months how often did your partner physically hurt you?: 1   . Over the last 12 months how often did your partner insult you or talk down to you?: 1   . Over the last 12 months how often did your partner threaten you with physical harm?: 1   . Over the last 12 months how often did your partner scream or curse at you?: 1    Outpatient Medications Prior to Visit  Medication Sig Dispense Refill  . allopurinol (ZYLOPRIM) 300 MG tablet Take 1 tablet (300 mg total) by mouth daily. 90 tablet 1  . aspirin EC 81 MG tablet Take 81 mg by mouth daily.    Marland Kitchen atorvastatin (LIPITOR) 20 MG tablet Take 1 tablet (20 mg total) by mouth daily. 90 tablet 3  . BD PEN NEEDLE NANO 2ND GEN 32G X 4 MM MISC USE AS DIRECTED WITH LANTUS ONCE A DAY. 100 each 1  . Calcium Carb-Cholecalciferol (CALCIUM 600 + D PO) Take 1 tablet by mouth daily.    . carvedilol (COREG) 3.125 MG tablet Take 1 tablet (3.125 mg total) by mouth 2 (two) times daily with a meal. 60 tablet 3  . cefdinir (OMNICEF) 300 MG capsule Take 1 capsule (300 mg total) by mouth 2 (two)  times daily. (Patient taking differently: Take 300 mg by mouth 2 (two) times daily. for 3 days., Starting Tue 08/15/2022, Until Fri 08/18/2022) 10 capsule 0  . diphenhydramine-acetaminophen (TYLENOL PM) 25-500 MG TABS Take 1 tablet by mouth at bedtime as needed (sleep).     . furosemide (LASIX) 40 MG tablet TAKE 1 TABLET BY MOUTH EVERY DAY 90 tablet 3  . glimepiride (AMARYL) 2 MG tablet TAKE 1 TABLET BY MOUTH EVERY DAY WITH BREAKFAST 90 tablet 1  . glucose blood (ONETOUCH VERIO) test strip Check Blood Sugar 3 times daily. 100 strip 4  . hyoscyamine (LEVSIN SL) 0.125 MG SL tablet Place 1 tablet (0.125 mg total) under the tongue every 6 (six) hours as needed. 30 tablet 1  . insulin glargine (LANTUS SOLOSTAR) 100 UNIT/ML Solostar Pen INJECT 10 UNITS INTO THE SKIN DAILY (Patient taking differently: Inject 10 Units into the skin daily. Pt taking 12 units QHS) 15 mL 1  . irbesartan (AVAPRO) 75 MG tablet Take 1 tablet (75 mg total) by mouth daily. 90 tablet 3  . Lancets (ONETOUCH DELICA PLUS LANCET33G) MISC USE AS DIRECTED TO TEST DAILY 100 each 12  . meclizine (ANTIVERT) 12.5 MG tablet Take 1 tablet (12.5 mg total) by mouth 3 (three) times daily as needed for dizziness. As needed for   Dizziness or nausea 30 tablet 1  . nystatin cream (MYCOSTATIN) Apply 1 application topically 2 (two) times daily. 30 g 1  . Vitamin D, Ergocalciferol, (DRISDOL) 1.25 MG (50000 UNIT) CAPS capsule TAKE 1 CAPSULE (50,000 UNITS TOTAL) BY MOUTH EVERY 7 (SEVEN) DAYS 12 capsule 1   No facility-administered medications prior to visit.    No Known Allergies  Review of Systems  Constitutional:  Positive for malaise/fatigue. Negative for fever.  HENT:  Positive for congestion.   Eyes:  Negative for blurred vision.  Respiratory:  Positive for sputum production. Negative for  shortness of breath.   Cardiovascular:  Negative for chest pain, palpitations and leg swelling.  Gastrointestinal:  Positive for diarrhea. Negative for  abdominal pain, blood in stool and nausea.  Genitourinary:  Positive for frequency. Negative for dysuria.  Musculoskeletal:  Negative for falls.  Skin:  Negative for rash.  Neurological:  Positive for weakness. Negative for dizziness, loss of consciousness and headaches.  Endo/Heme/Allergies:  Negative for environmental allergies.  Psychiatric/Behavioral:  Positive for hallucinations. Negative for depression. The patient is not nervous/anxious.        Objective:    Physical Exam Constitutional:      General: She is not in acute distress.    Appearance: Normal appearance. She is well-developed. She is not toxic-appearing.  HENT:     Head: Normocephalic and atraumatic.     Right Ear: External ear normal.     Left Ear: External ear normal.     Nose: Nose normal.  Eyes:     General:        Right eye: No discharge.        Left eye: No discharge.     Conjunctiva/sclera: Conjunctivae normal.  Neck:     Thyroid: No thyromegaly.  Cardiovascular:     Rate and Rhythm: Normal rate.     Heart sounds: Murmur heard.  Pulmonary:     Effort: Pulmonary effort is normal. No respiratory distress.     Breath sounds: Normal breath sounds.  Abdominal:     General: Bowel sounds are normal.     Palpations: Abdomen is soft.     Tenderness: There is no abdominal tenderness. There is no guarding.  Musculoskeletal:        General: Normal range of motion.     Cervical back: Neck supple.  Lymphadenopathy:     Cervical: No cervical adenopathy.  Skin:    General: Skin is warm and dry.  Neurological:     Mental Status: She is alert and oriented to person, place, and time.  Psychiatric:        Mood and Affect: Mood normal.        Behavior: Behavior normal.        Thought Content: Thought content normal.        Judgment: Judgment normal.   BP 112/62 (BP Location: Left Arm, Patient Position: Sitting, Cuff Size: Normal)   Pulse 92   Temp 97.8 F (36.6 C) (Oral)   Resp 16   Ht 5\' 4"  (1.626 m)    Wt 209 lb 12.8 oz (95.2 kg)   SpO2 95%   BMI 36.01 kg/m  Wt Readings from Last 3 Encounters:  08/22/22 209 lb 12.8 oz (95.2 kg)  04/11/22 214 lb (97.1 kg)  02/23/22 214 lb (97.1 kg)    Diabetic Foot Exam - Simple   No data filed    Lab Results  Component Value Date   WBC 8.0 08/22/2022   HGB 11.4 (L) 08/22/2022   HCT 35.3 (L) 08/22/2022   PLT 184.0 08/22/2022   GLUCOSE 86 08/22/2022   CHOL 159 07/13/2022   TRIG 175.0 (H) 07/13/2022   HDL 48.80 07/13/2022   LDLDIRECT 71.0 04/11/2022   LDLCALC 75 07/13/2022   ALT 39 (H) 08/22/2022   AST 46 (H) 08/22/2022   NA 141 08/22/2022   K 5.0 08/22/2022   CL 106 08/22/2022   CREATININE 2.07 (H) 08/22/2022   BUN 37 (H) 08/22/2022   CO2 25 08/22/2022   TSH 0.71 07/13/2022   INR 1.1 (H)  03/22/2021   HGBA1C 7.4 (H) 07/13/2022   MICROALBUR <0.7 04/11/2022    Lab Results  Component Value Date   TSH 0.71 07/13/2022   Lab Results  Component Value Date   WBC 8.0 08/22/2022   HGB 11.4 (L) 08/22/2022   HCT 35.3 (L) 08/22/2022   MCV 100.1 (H) 08/22/2022   PLT 184.0 08/22/2022   Lab Results  Component Value Date   NA 141 08/22/2022   K 5.0 08/22/2022   CHLORIDE 104 11/01/2016   CO2 25 08/22/2022   GLUCOSE 86 08/22/2022   BUN 37 (H) 08/22/2022   CREATININE 2.07 (H) 08/22/2022   BILITOT 0.9 08/22/2022   ALKPHOS 74 08/22/2022   AST 46 (H) 08/22/2022   ALT 39 (H) 08/22/2022   PROT 6.8 08/22/2022   ALBUMIN 4.0 08/22/2022   CALCIUM 10.1 08/22/2022   ANIONGAP 5 12/01/2021   EGFR 50 (L) 11/01/2016   GFR 22.41 (L) 08/22/2022   Lab Results  Component Value Date   CHOL 159 07/13/2022   Lab Results  Component Value Date   HDL 48.80 07/13/2022   Lab Results  Component Value Date   LDLCALC 75 07/13/2022   Lab Results  Component Value Date   TRIG 175.0 (H) 07/13/2022   Lab Results  Component Value Date   CHOLHDL 3 07/13/2022   Lab Results  Component Value Date   HGBA1C 7.4 (H) 07/13/2022       Assessment &  Plan:  Snoring -     Ambulatory referral to Pulmonology  Restless sleeper -     Ambulatory referral to Pulmonology  Other fatigue -     Ambulatory referral to Pulmonology  Diverticulitis Assessment & Plan: With abdominal pain and diarrhea now after treatment with antibiotics, check cdiff and referred to gastroenterology for further consideration  Orders: -     Ambulatory referral to Gastroenterology  Nonischemic cardiomyopathy [facility]) -     Ambulatory referral to Cardiology  Hyperkalemia  Renal insufficiency -     Comprehensive metabolic panel  Primary hypertension Assessment & Plan: Well controlled, no changes to meds. Encouraged heart healthy diet such as the DASH diet and exercise as tolerated. Well controlled, no changes to meds. Encouraged heart healthy diet such as the DASH diet and exercise as tolerated.   Orders: -     Ambulatory referral to Cardiology  Congestive heart failure, unspecified HF chronicity, unspecified heart failure type (HCC) -     Ambulatory referral to Cardiology  Diarrhea, unspecified type Assessment & Plan: Worse since antibiotics. Check CDiff  Orders: -     Ambulatory referral to Gastroenterology -     Clostridium Difficile by PCR  Urinary tract infection without hematuria, site unspecified -     Urinalysis, Routine w reflex microscopic -     Urine Culture  Pneumonia due to infectious organism, unspecified laterality, unspecified part of lung -     CBC with Differential/Platelet  Chronic gout without tophus, unspecified cause, unspecified site Assessment & Plan: Hydrate and monitor   Vitamin D deficiency Assessment & Plan: Hydrate and monitor    Malignant neoplasm of kidney, unspecified laterality (HCC) Assessment & Plan: Has just one kidney   Chronic systolic heart failure (HCC) Assessment & Plan: No recent exacerbation   Type 2 diabetes mellitus without complication, without long-term current use of insulin  (HCC) Assessment & Plan: hgba1c acceptable, minimize simple carbs. Increase exercise as tolerated. Continue current meds    Bronchitis Assessment & Plan: Recently hospitalized with possible pneumonia  vs bronchitis will refer back to pulmonology for evaluation     Assessment and Plan    Chronic Obstructive Pulmonary Disease (COPD): History of smoking, with cessation in 2008. Recent pneumonia with residual chronic changes in the left lower lobe. -Schedule follow-up with pulmonologist to assess new baseline post-pneumonia. -Repeat chest x-ray to evaluate resolution of pneumonia.  Chronic Kidney Disease (CKD): Single kidney with creatinine of 1.86 in June. -Order labs to assess current kidney function. -Encourage hydration and avoidance of nephrotoxic substances.  Cirrhosis: Likely secondary to Non-Alcoholic Steatohepatitis (NASH) given history of glucose intolerance and central adiposity. -Schedule follow-up with gastroenterologist to assess severity of cirrhosis. -Advise on diet modification, focusing on lean meats, poultry, and fish.  Sleep Apnea: Reports of heavy snoring and daytime sleepiness. -Order sleep study to evaluate for sleep apnea.  Diverticulitis: Recent diagnosis, managed by gastroenterologist. -Continue follow-up with gastroenterologist.  Urinary Incontinence: Reports of urgency and incontinence upon standing. -Consider pelvic floor physical therapy.  Diarrhea: Reports of loose stools 3-4 times daily since recent illness. -Order stool sample to rule out C. diff infection.  Cardiac Function: Mild valvular leakage and slight enlargement of the heart muscle noted on recent echocardiogram. -Continue blood pressure and weight management to reduce cardiac strain. -Schedule follow-up with cardiologist.  General Health Maintenance / Followup Plans: -Encourage hydration, especially during episodes of diarrhea or vomiting. -Advise on the benefits of short naps to reduce  stress on the heart. -Order labs to assess kidney function and blood count. -Plan follow-up appointment in September.         Danise Edge, MD

## 2022-08-23 NOTE — Assessment & Plan Note (Signed)
With abdominal pain and diarrhea now after treatment with antibiotics, check cdiff and referred to gastroenterology for further consideration

## 2022-08-23 NOTE — Assessment & Plan Note (Signed)
Recently hospitalized with possible pneumonia vs bronchitis will refer back to pulmonology for evaluation

## 2022-08-23 NOTE — Assessment & Plan Note (Signed)
Worse since antibiotics. Check CDiff

## 2022-08-25 ENCOUNTER — Other Ambulatory Visit (INDEPENDENT_AMBULATORY_CARE_PROVIDER_SITE_OTHER): Payer: No Typology Code available for payment source

## 2022-08-25 DIAGNOSIS — R197 Diarrhea, unspecified: Secondary | ICD-10-CM

## 2022-08-25 NOTE — Addendum Note (Signed)
Addended by: Mervin Kung A on: 08/25/2022 11:32 AM   Modules accepted: Orders

## 2022-08-27 ENCOUNTER — Other Ambulatory Visit: Payer: Self-pay | Admitting: Family Medicine

## 2022-08-27 MED ORDER — VANCOMYCIN HCL 125 MG PO CAPS: 125.0000 mg | ORAL_CAPSULE | Freq: Four times a day (QID) | ORAL | 0 refills | Status: AC

## 2022-09-01 NOTE — Progress Notes (Unsigned)
Cardiology Clinic Note   Patient Name: Amanda Crawford Date of Encounter: 09/04/2022  Primary Care Provider:  [name], MD Primary Cardiologist:  Chrystie Nose, MD  Patient Profile     Amanda Crawford 77 year old female presents to the clinic today for follow-up evaluation of her nonischemic cardiomyopathy and hypertension.  Past Medical History    Past Medical History:  Diagnosis Date   Allergic state 03/12/2015   Arthritis    Colon polyp 11/19/2014   Dermatitis 03/12/2015   Diarrhea 06/29/2016   Diverticulitis    DM (diabetes mellitus), type 2 (HCC) 06/29/2016   GERD (gastroesophageal reflux disease)    occ   Gout 03/12/2015   H/O measles    H/O mumps    Headache(784.0)    migraines   History of chicken pox 11/23/2014   Hyperglycemia 06/29/2016   Lung cancer (HCC)    Migraine 11/23/2014   Obesity 11/23/2014   Pneumonia    child   Preventative health care 09/12/2015   Primary hypertension 12/15/2020   Renal cell cancer (HCC)    renal cell ca dx 9/01 and 8/08;   Renal insufficiency    Shortness of breath    occ   Skin lesion of breast 03/12/2015   Past Surgical History:  Procedure Laterality Date   ABDOMINAL HYSTERECTOMY     ANAL RECTAL MANOMETRY N/A 06/29/2021   Procedure: ANO RECTAL MANOMETRY;  Surgeon: Shellia Cleverly, DO;  Location: [facility];  Service: Gastroenterology;  Laterality: N/A;   APPENDECTOMY     CARDIAC CATHETERIZATION     yrs ago neg   CHOLECYSTECTOMY     COLONOSCOPY  2018   ESOPHAGOGASTRODUODENOSCOPY     years ago   IR RADIOLOGIST EVAL & MGMT  07/17/2018   IR RADIOLOGIST EVAL & MGMT  09/12/2018   IR RADIOLOGIST EVAL & MGMT  06/18/2019   IR RADIOLOGIST EVAL & MGMT  01/13/2020   IR RADIOLOGIST EVAL & MGMT  07/08/2020   IR RADIOLOGIST EVAL & MGMT  10/12/2020   KIDNEY SURGERY  2001   Removed of left kidney    LUNG CANCER SURGERY Left 08   RADIOLOGY WITH ANESTHESIA N/A 08/21/2018   Procedure: CT WITH ANESTHESIA RENAL CRYOABLATION;   Surgeon: Oley Balm, MD;  Location: [facility];  Service: Radiology;  Laterality: N/A;   RENAL MASS EXCISION Left 01   RIGHT/LEFT HEART CATH AND CORONARY ANGIOGRAPHY N/A 07/27/2016   Procedure: Right/Left Heart Cath and Coronary Angiography;  Surgeon: Marykay Lex, MD;  Location: [facility] INVASIVE CV LAB;  Service: Cardiovascular;  Laterality: N/A;   THORACOTOMY Left 05/09/2012   Procedure: THORACOTOMY MAJOR;  Surgeon: Alleen Borne, MD;  Location: [facility] OR;  Service: Thoracic;  Laterality: Left;   VIDEO BRONCHOSCOPY N/A 05/09/2012   Procedure: VIDEO BRONCHOSCOPY;  Surgeon: Alleen Borne, MD;  Location: [facility] OR;  Service: Thoracic;  Laterality: N/A;   WEDGE RESECTION Left 05/09/2012   Procedure: LEFT UPPER LOBE WEDGE RESECTION;  Surgeon: Alleen Borne, MD;  Location: [facility] OR;  Service: Thoracic;  Laterality: Left;    Allergies  No Known Allergies  History of Present Illness     Amanda Crawford has a PMH of nonischemic cardiomyopathy, LBBB, chronic systolic CHF, bronchitis, GERD, type 2 diabetes, osteopenia, DJD, cancer of the kidney, dyspnea, morbid obesity, diarrhea, anemia, elevated lipase, and hyperlipidemia.  She was seen in follow-up by Dr. Rennis Golden on 02/23/2022.  During that time she was stable from a cardiac standpoint.  Her blood pressure was well-controlled.  She was considering starting Silver sne[facility] at the [facility].  Her left bundle branch block was stable.  Follow-up labs were due to be drawn by her PCP.  Follow-up was planned for 1 year and as needed.   She was admitted to the hospital on 07/2022 at [facility] with acute respiratory failure with hypoxia.  She reported feeling fatigued and short of breath with activity.  Her vital signs are stable.  She denied nausea and vomiting.  Her chest CT showed chronic lung changes with no acute pulmonary findings.  She was negative for PE.  It was felt that her symptoms were noninfectious.  Echocardiogram was ordered and showed an LVEF of 60 to 65%,  mild diastolic dysfunction, low-normal LA pressures, mild concentric LVH and mild tricuspid valve regurgitation.  Her EKG showed normal sinus rhythm.  Blood cultures were drawn but showed no growth.  Due to her symptoms she was treated for pneumonia.  She was instructed to complete her course of antibiotics and follow-up with her PCP.  She followed up with her PCP and was noted to have abdominal pain and diarrhea after treatment with antibiotics.  She had a stool sample ordered to check for C. difficile and was referred to gastroenterology for further consultation.  She was referred back to pulmonology for further evaluation of her bronchitis versus pneumonia.  She was instructed to follow-up with cardiology for review of her nonischemic cardiomyopathy and CHF.  She presents to the clinic today for follow-up evaluation and states she is feeling much better after hospitalization.  We reviewed her urgent care visit and her hospitalization.  She expressed understanding.  Her EKG today shows normal sinus rhythm with nonspecific intraventricular conduction block 83 bpm.  Her blood pressure is well-controlled.  She brings in her blood pressure log and heart rates which have also been well-controlled.  We reviewed her echocardiogram from [facility].  I will continue her current medication regimen and plan follow-up in 4 months..  Today she denies chest pain, increased shortness of breath, lower extremity edema, fatigue, palpitations, melena, hematuria, hemoptysis, diaphoresis.  Home Medications    Prior to Admission medications   Medication Sig Start Date End Date Taking? Authorizing Provider  allopurinol (ZYLOPRIM) 300 MG tablet Take 1 tablet (300 mg total) by mouth daily. 04/17/22   [name], MD  aspirin EC 81 MG tablet Take 81 mg by mouth daily.    [provider]  atorvastatin (LIPITOR) 20 MG tablet Take 1 tablet (20 mg total) by mouth daily. 06/22/22   [name], MD  BD PEN NEEDLE NANO 2ND  GEN 32G X 4 MM MISC USE AS DIRECTED WITH LANTUS ONCE A DAY. 05/22/22   [name], MD  Calcium Carb-Cholecalciferol (CALCIUM 600 + D PO) Take 1 tablet by mouth daily.    [provider]  carvedilol (COREG) 3.125 MG tablet Take 1 tablet (3.125 mg total) by mouth 2 (two) times daily with a meal. 04/11/22   [name], MD  cefdinir (OMNICEF) 300 MG capsule Take 1 capsule (300 mg total) by mouth 2 (two) times daily. Patient taking differently: Take 300 mg by mouth 2 (two) times daily. for 3 days., Starting Tue 08/15/2022, Until Fri 08/18/2022 07/13/22   [name], MD  diphenhydramine-acetaminophen (TYLENOL PM) 25-500 MG TABS Take 1 tablet by mouth at bedtime as needed (sleep).     [provider]  furosemide (LASIX) 40 MG tablet TAKE 1  TABLET BY MOUTH EVERY DAY 02/22/22   Hilty, Lisette Abu, MD  glimepiride (AMARYL) 2 MG tablet TAKE 1 TABLET BY MOUTH EVERY DAY WITH BREAKFAST 06/01/22   [name], MD  glucose blood (ONETOUCH VERIO) test strip Check Blood Sugar 3 times daily. 06/08/22   [name], MD  hyoscyamine (LEVSIN SL) 0.125 MG SL tablet Place 1 tablet (0.125 mg total) under the tongue every 6 (six) hours as needed. 10/14/20   [name], MD  insulin glargine (LANTUS SOLOSTAR) 100 UNIT/ML Solostar Pen INJECT 10 UNITS INTO THE SKIN DAILY Patient taking differently: Inject 10 Units into the skin daily. Pt taking 12 units QHS 06/01/22   [name], MD  irbesartan (AVAPRO) 75 MG tablet Take 1 tablet (75 mg total) by mouth daily. 04/25/22   [name], MD  Lancets [facility] DELICA PLUS LANCET33G) MISC USE AS DIRECTED TO TEST DAILY 08/11/21   [name], MD  meclizine (ANTIVERT) 12.5 MG tablet Take 1 tablet (12.5 mg total) by mouth 3 (three) times daily as needed for dizziness. As needed for   Dizziness or nausea 11/23/14   [name], MD  nystatin cream (MYCOSTATIN) Apply 1 application topically 2 (two) times daily. 10/18/20   [name], MD   vancomycin (VANCOCIN) 125 MG capsule Take 1 capsule (125 mg total) by mouth 4 (four) times daily for 10 days. 08/27/22 09/06/22  [name], MD  Vitamin D, Ergocalciferol, (DRISDOL) 1.25 MG (50000 UNIT) CAPS capsule TAKE 1 CAPSULE (50,000 UNITS TOTAL) BY MOUTH EVERY 7 (SEVEN) DAYS 11/22/20   [name], MD  glyBURIDE-metformin [facility]) 1.25-250 MG tablet Take 1 tablet by mouth 2 (two) times daily with a meal. 03/25/19 03/25/19  [name], MD    Family History    Family History  Problem Relation Age of Onset   Dementia Mother    Heart failure Father    Diabetes Father    Hyperlipidemia Father    Heart disease Father    Hypertension Father    Asthma Brother    Asthma Daughter    Alcohol abuse Maternal Aunt    Colon cancer Paternal Aunt    Cancer Paternal Grandmother    Hearing loss Paternal Grandfather    Stroke Paternal Grandfather    She indicated that her mother is deceased. She indicated that her father is deceased. She indicated that her sister is alive. She indicated that her brother is alive. She indicated that her maternal grandmother is deceased. She indicated that her maternal grandfather is deceased. She indicated that her paternal grandmother is deceased. She indicated that her paternal grandfather is deceased. She indicated that her daughter is alive. She indicated that her son is alive. She indicated that the status of her maternal aunt is unknown. She indicated that the status of her paternal aunt is unknown.  Social History    Social History   Socioeconomic History   Marital status: Married    Spouse name: Not on file   Number of children: Not on file   Years of education: Not on file   Highest education level: Not on file  Occupational History   Occupation: retired  Tobacco Use   Smoking status: Former    Current packs/day: 0.00    Average packs/day: 0.5 packs/day for 35.0 years (17.5 ttl pk-yrs)    Types: Cigarettes    Start date: 05/08/1971     Quit date: 05/08/2006    Years since quitting:  16.3   Smokeless tobacco: Never  Vaping Use   Vaping status: Never Used  Substance and Sexual Activity   Alcohol use: Yes    Comment: seldomly   Drug use: No   Sexual activity: Not on file    Comment: lives with husband, no dietary restrictions.   Other Topics Concern   Not on file  Social History Narrative   Not on file   Social Determinants of Health   Financial Resource Strain: Low Risk  (07/05/2020)   Overall Financial Resource Strain (CARDIA)    Difficulty of Paying Living Expenses: Not hard at all  Food Insecurity: No Food Insecurity (08/12/2022)   Received from [facility]   Hunger Vital Sign    Worried About Running Out of Food in the Last Year: Never true    Ran Out of Food in the Last Year: Never true  Transportation Needs: No Transportation Needs (08/12/2022)   Received from [facility] - Transportation    Lack of Transportation (Medical): No    Lack of Transportation (Non-Medical): No  Physical Activity: Inactive (07/05/2020)   Exercise Vital Sign    Days of Exercise per Week: 0 days    Minutes of Exercise per Session: 0 min  Stress: No Stress Concern Present (08/12/2022)   Received from [facility] of Occupational [facility] - Occupational Stress Questionnaire    Feeling of Stress : Not at all  Social Connections: Unknown (06/06/2021)   Received from [facility]   Social Network    Social Network: Not on file  Intimate Partner Violence: Not At Risk (08/12/2022)   Received from [facility]   HITS    Over the last 12 months how often did your partner physically hurt you?: 1    Over the last 12 months how often did your partner insult you or talk down to you?: 1    Over the last 12 months how often did your partner threaten you with physical harm?: 1    Over the last 12 months how often did your partner scream or curse at you?: 1     Review of Systems    General:  No  chills, fever, night sweats or weight changes.  Cardiovascular:  No chest pain, dyspnea on exertion, edema, orthopnea, palpitations, paroxysmal nocturnal dyspnea. Dermatological: No rash, lesions/masses Respiratory: No cough, dyspnea Urologic: No hematuria, dysuria Abdominal:   No nausea, vomiting, diarrhea, bright red blood per rectum, melena, or hematemesis Neurologic:  No visual changes, wkns, changes in mental status. All other systems reviewed and are otherwise negative except as noted above.  Physical Exam    VS:  BP (!) 104/58 (BP Location: Left Arm, Patient Position: Sitting, Cuff Size: Normal)   Pulse 87   Ht 5\' 4"  (1.626 m)   Wt 213 lb 9.6 oz (96.9 kg)   SpO2 97%   BMI 36.66 kg/m  , BMI Body mass index is 36.66 kg/m. GEN: Well nourished, well developed, in no acute distress. HEENT: normal. Neck: Supple, no JVD, carotid bruits, or masses. Cardiac: RRR, no murmurs, rubs, or gallops. No clubbing, cyanosis, edema.  Radials/DP/PT 2+ and equal bilaterally.  Respiratory:  Respirations regular and unlabored, clear to auscultation bilaterally. GI: Soft, nontender, nondistended, BS + x 4. MS: no deformity or atrophy. Skin: warm and dry, no rash. Neuro:  Strength and sensation are intact. Psych: Normal affect.  Accessory Clinical Findings    Recent Labs: 07/13/2022: TSH 0.71 08/22/2022:  ALT 39; BUN 37; Creatinine, Ser 2.07; Hemoglobin 11.4; Platelets 184.0; Potassium 5.0; Sodium 141   Recent Lipid Panel    Component Value Date/Time   CHOL 159 07/13/2022 0904   CHOL 193 09/30/2019 1014   TRIG 175.0 (H) 07/13/2022 0904   TRIG 267 (H) 09/30/2019 1014   HDL 48.80 07/13/2022 0904   CHOLHDL 3 07/13/2022 0904   VLDL 35.0 07/13/2022 0904   LDLCALC 75 07/13/2022 0904   LDLDIRECT 71.0 04/11/2022 1422         ECG personally reviewed by me today- EKG Interpretation Date/Time:  Monday September 04 2022 10:26:47 EDT Ventricular Rate:  83 PR Interval:  148 QRS Duration:  140 QT  Interval:  398 QTC Calculation: 467 R Axis:   56  Text Interpretation: Normal sinus rhythm Non-specific intra-ventricular conduction block Confirmed by Edd Fabian [phone]) on 09/04/2022 10:30:03 AM     Echocardiogram 03/29/2020  IMPRESSIONS     1. Left ventricular ejection fraction, by estimation, is 55 to 60%. The  left ventricle has normal function. The left ventricle has no regional  wall motion abnormalities. There is moderate concentric left ventricular  hypertrophy. Indeterminate diastolic  filling due to E-A fusion. The average left ventricular global  longitudinal strain is -19.5 %. The global longitudinal strain is normal.   2. Right ventricular systolic function is normal. The right ventricular  size is normal. Tricuspid regurgitation signal is inadequate for assessing  PA pressure.   3. The mitral valve is normal in structure. Trivial mitral valve  regurgitation. No evidence of mitral stenosis.   4. The aortic valve is tricuspid. Aortic valve regurgitation is not  visualized. Mild aortic valve sclerosis is present, with no evidence of  aortic valve stenosis.   5. The inferior vena cava is dilated in size with >50% respiratory  variability, suggesting right atrial pressure of 8 mmHg.   Comparison(s): 05/21/17 45-50%.   FINDINGS   Left Ventricle: Left ventricular ejection fraction, by estimation, is 55  to 60%. The left ventricle has normal function. The left ventricle has no  regional wall motion abnormalities. The average left ventricular global  longitudinal strain is -19.5 %.  The global longitudinal strain is normal. The left ventricular internal  cavity size was normal in size. There is moderate concentric left  ventricular hypertrophy. Indeterminate diastolic filling due to E-A  fusion. Normal left ventricular filling  pressure.   Right Ventricle: The right ventricular size is normal. No increase in  right ventricular wall thickness. Right ventricular  systolic function is  normal. Tricuspid regurgitation signal is inadequate for assessing PA  pressure.   Left Atrium: Left atrial size was normal in size.   Right Atrium: Right atrial size was normal in size.   Pericardium: There is no evidence of pericardial effusion.   Mitral Valve: The mitral valve is normal in structure. There is mild  calcification of the anterior mitral valve leaflet(s). Trivial mitral  valve regurgitation. No evidence of mitral valve stenosis.   Tricuspid Valve: The tricuspid valve is normal in structure. Tricuspid  valve regurgitation is trivial. No evidence of tricuspid stenosis.   Aortic Valve: The aortic valve is tricuspid. Aortic valve regurgitation is  not visualized. Mild aortic valve sclerosis is present, with no evidence  of aortic valve stenosis.   Pulmonic Valve: The pulmonic valve was normal in structure. Pulmonic valve  regurgitation is trivial. No evidence of pulmonic stenosis.   Aorta: The aortic root is normal in size and structure.  Venous: The inferior vena cava is dilated in size with greater than 50%  respiratory variability, suggesting right atrial pressure of 8 mmHg.   IAS/Shunts: No atrial level shunt detected by color flow Doppler.      Assessment & Plan   1.  Nonischemic cardiomyopathy-euvolemic.  Recent echocardiogram at [facility] showed an LVEF of 60 to 65%, mild diastolic dysfunction, and mild tricuspid regurgitation. Continue current medical therapy Heart healthy low-sodium diet Increase physical activity as tolerated  Hyperlipidemia-LDL 75 on 07/13/22. High-fiber diet Continue aspirin, atorvastatin  Coronary artery disease-denies recent episodes of chest discomfort.  Underwent cardiac catheterization 6/18 which showed mild nonobstructive CAD Continue aspirin, atorvastatin  Left bundle branch block-denies recent episodes of lightheadedness, presyncope or syncope. Heart rate today 83.bpm Continue to monitor   Thomasene Ripple.  NP-C     09/04/2022, 10:39 AM [facility] [address] Office [phone] Fax [phone]    I spent 14 minutes examining this patient, reviewing medications, and using patient centered shared decision making involving her cardiac care.  Prior to her visit I spent greater than 20 minutes reviewing her past medical history,  medications, and prior cardiac tests.

## 2022-09-04 ENCOUNTER — Encounter: Payer: Self-pay | Admitting: General Practice

## 2022-09-04 ENCOUNTER — Ambulatory Visit: Payer: No Typology Code available for payment source | Attending: General Practice | Admitting: General Practice

## 2022-09-04 VITALS — BP 104/58 | HR 87 | Ht 64.0 in | Wt 213.6 lb

## 2022-09-04 DIAGNOSIS — I447 Left bundle-branch block, unspecified: Secondary | ICD-10-CM | POA: Diagnosis not present

## 2022-09-04 DIAGNOSIS — E785 Hyperlipidemia, unspecified: Secondary | ICD-10-CM

## 2022-09-04 DIAGNOSIS — I428 Other cardiomyopathies: Secondary | ICD-10-CM | POA: Diagnosis not present

## 2022-09-04 DIAGNOSIS — I5022 Chronic systolic (congestive) heart failure: Secondary | ICD-10-CM

## 2022-09-04 NOTE — Patient Instructions (Signed)
Medication Instructions:  The current medical regimen is effective;  continue present plan and medications as directed. Please refer to the Current Medication list given to you today.  *If you need a refill on your cardiac medications before your next appointment, please call your pharmacy*  Lab Work: NONE If you have labs (blood work) drawn today and your tests are completely normal, you will receive your results only by:  MyChart Message (if you have MyChart) OR  A paper copy in the mail If you have any lab test that is abnormal or we need to change your treatment, we will call you to review the results.  Testing/Procedures: NONE  Other Instructions PLEASE TAKE AND LOG BP/HR DAILY  Follow-Up: At [facility], you and your health needs are our priority.  As part of our continuing mission to provide you with exceptional heart care, we have created designated Provider Care Teams.  These Care Teams include your primary Cardiologist (physician) and Advanced Practice Providers (APPs -  Physician Assistants and Nurse Practitioners) who all work together to provide you with the care you need, when you need it.  We recommend signing up for the patient portal called "MyChart".  Sign up information is provided on this After Visit Summary.  MyChart is used to connect with patients for Virtual Visits (Telemedicine).  Patients are able to view lab/test results, encounter notes, upcoming appointments, etc.  Non-urgent messages can be sent to your provider as well.   To learn more about what you can do with MyChart, go to [email or web address].    Your next appointment:   4 month(s)  Provider:   Chrystie Nose, MD  or Edd Fabian, FNP

## 2022-09-06 ENCOUNTER — Other Ambulatory Visit (INDEPENDENT_AMBULATORY_CARE_PROVIDER_SITE_OTHER): Payer: No Typology Code available for payment source

## 2022-09-06 DIAGNOSIS — J189 Pneumonia, unspecified organism: Secondary | ICD-10-CM

## 2022-09-06 DIAGNOSIS — N289 Disorder of kidney and ureter, unspecified: Secondary | ICD-10-CM | POA: Diagnosis not present

## 2022-09-06 LAB — COMPREHENSIVE METABOLIC PANEL
ALT: 58 U/L — ABNORMAL HIGH (ref 0–35)
AST: 64 U/L — ABNORMAL HIGH (ref 0–37)
Albumin: 4 g/dL (ref 3.5–5.2)
Alkaline Phosphatase: 76 U/L (ref 39–117)
BUN: 29 mg/dL — ABNORMAL HIGH (ref 6–23)
CO2: 26 mEq/L (ref 19–32)
Calcium: 10.5 mg/dL (ref 8.4–10.5)
Chloride: 103 mEq/L (ref 96–112)
Creatinine, Ser: 1.4 mg/dL — ABNORMAL HIGH (ref 0.40–1.20)
GFR: 35.82 mL/min — ABNORMAL LOW (ref >60.00)
Glucose, Bld: 149 mg/dL — ABNORMAL HIGH (ref 70–99)
Potassium: 4.6 mEq/L (ref 3.5–5.1)
Sodium: 138 mEq/L (ref 135–145)
Total Bilirubin: 1 mg/dL (ref 0.2–1.2)
Total Protein: 6.7 g/dL (ref 6.0–8.3)

## 2022-09-06 LAB — CBC WITH DIFFERENTIAL/PLATELET
Basophils Absolute: 0 10*3/uL (ref 0.0–0.1)
Basophils Relative: 0.6 % (ref 0.0–3.0)
Eosinophils Absolute: 0.2 10*3/uL (ref 0.0–0.7)
Eosinophils Relative: 2.6 % (ref 0.0–5.0)
HCT: 35.6 % — ABNORMAL LOW (ref 36.0–46.0)
Hemoglobin: 11.4 g/dL — ABNORMAL LOW (ref 12.0–15.0)
Lymphocytes Relative: 28.7 % (ref 12.0–46.0)
Lymphs Abs: 1.7 10*3/uL (ref 0.7–4.0)
MCHC: 32 g/dL (ref 30.0–36.0)
MCV: 101.4 fl — ABNORMAL HIGH (ref 78.0–100.0)
Monocytes Absolute: 0.5 10*3/uL (ref 0.1–1.0)
Monocytes Relative: 8.4 % (ref 3.0–12.0)
Neutro Abs: 3.6 10*3/uL (ref 1.4–7.7)
Neutrophils Relative %: 59.7 % (ref 43.0–77.0)
Platelets: 177 10*3/uL (ref 150.0–400.0)
RBC: 3.51 Mil/uL — ABNORMAL LOW (ref 3.87–5.11)
RDW: 16.5 % — ABNORMAL HIGH (ref 11.5–15.5)
WBC: 6 10*3/uL (ref 4.0–10.5)

## 2022-09-07 ENCOUNTER — Other Ambulatory Visit: Payer: Self-pay | Admitting: *Deleted

## 2022-09-07 ENCOUNTER — Ambulatory Visit (INDEPENDENT_AMBULATORY_CARE_PROVIDER_SITE_OTHER): Payer: No Typology Code available for payment source

## 2022-09-07 DIAGNOSIS — D539 Nutritional anemia, unspecified: Secondary | ICD-10-CM

## 2022-09-07 LAB — VITAMIN B12: Vitamin B-12: 612 pg/mL (ref 211–911)

## 2022-09-10 ENCOUNTER — Other Ambulatory Visit: Payer: Self-pay | Admitting: Family Medicine

## 2022-09-13 ENCOUNTER — Other Ambulatory Visit (INDEPENDENT_AMBULATORY_CARE_PROVIDER_SITE_OTHER): Payer: No Typology Code available for payment source

## 2022-09-13 DIAGNOSIS — D649 Anemia, unspecified: Secondary | ICD-10-CM

## 2022-09-15 LAB — FECAL OCCULT BLOOD, IMMUNOCHEMICAL: Fecal Occult Bld: NEGATIVE

## 2022-09-20 DIAGNOSIS — K7460 Unspecified cirrhosis of liver: Secondary | ICD-10-CM | POA: Diagnosis not present

## 2022-09-20 DIAGNOSIS — Z86010 Personal history of colonic polyps: Secondary | ICD-10-CM | POA: Diagnosis not present

## 2022-09-20 DIAGNOSIS — K766 Portal hypertension: Secondary | ICD-10-CM | POA: Diagnosis not present

## 2022-09-24 DIAGNOSIS — S92351A Displaced fracture of fifth metatarsal bone, right foot, initial encounter for closed fracture: Secondary | ICD-10-CM | POA: Diagnosis not present

## 2022-09-26 DIAGNOSIS — M79671 Pain in right foot: Secondary | ICD-10-CM | POA: Diagnosis not present

## 2022-10-04 DIAGNOSIS — Z23 Encounter for immunization: Secondary | ICD-10-CM | POA: Diagnosis not present

## 2022-10-06 ENCOUNTER — Other Ambulatory Visit: Payer: Self-pay | Admitting: Family Medicine

## 2022-10-10 DIAGNOSIS — M25571 Pain in right ankle and joints of right foot: Secondary | ICD-10-CM | POA: Diagnosis not present

## 2022-10-12 ENCOUNTER — Ambulatory Visit: Payer: No Typology Code available for payment source | Admitting: Family Medicine

## 2022-10-12 VITALS — BP 122/62 | HR 83 | Temp 97.5°F | Resp 16 | Ht 64.0 in | Wt 213.0 lb

## 2022-10-12 DIAGNOSIS — I10 Essential (primary) hypertension: Secondary | ICD-10-CM

## 2022-10-12 DIAGNOSIS — M79671 Pain in right foot: Secondary | ICD-10-CM | POA: Insufficient documentation

## 2022-10-12 DIAGNOSIS — Z0000 Encounter for general adult medical examination without abnormal findings: Secondary | ICD-10-CM

## 2022-10-12 DIAGNOSIS — R748 Abnormal levels of other serum enzymes: Secondary | ICD-10-CM | POA: Diagnosis not present

## 2022-10-12 DIAGNOSIS — E785 Hyperlipidemia, unspecified: Secondary | ICD-10-CM

## 2022-10-12 DIAGNOSIS — I5022 Chronic systolic (congestive) heart failure: Secondary | ICD-10-CM

## 2022-10-12 DIAGNOSIS — M8580 Other specified disorders of bone density and structure, unspecified site: Secondary | ICD-10-CM

## 2022-10-12 DIAGNOSIS — D649 Anemia, unspecified: Secondary | ICD-10-CM

## 2022-10-12 DIAGNOSIS — M1A9XX0 Chronic gout, unspecified, without tophus (tophi): Secondary | ICD-10-CM

## 2022-10-12 DIAGNOSIS — N289 Disorder of kidney and ureter, unspecified: Secondary | ICD-10-CM | POA: Diagnosis not present

## 2022-10-12 DIAGNOSIS — I428 Other cardiomyopathies: Secondary | ICD-10-CM

## 2022-10-12 DIAGNOSIS — E119 Type 2 diabetes mellitus without complications: Secondary | ICD-10-CM | POA: Diagnosis not present

## 2022-10-12 DIAGNOSIS — E559 Vitamin D deficiency, unspecified: Secondary | ICD-10-CM

## 2022-10-12 LAB — CBC WITH DIFFERENTIAL/PLATELET
Basophils Absolute: 0.1 10*3/uL (ref 0.0–0.1)
Basophils Relative: 0.9 % (ref 0.0–3.0)
Eosinophils Absolute: 0.1 10*3/uL (ref 0.0–0.7)
Eosinophils Relative: 2.2 % (ref 0.0–5.0)
HCT: 37.8 % (ref 36.0–46.0)
Hemoglobin: 12.1 g/dL (ref 12.0–15.0)
Lymphocytes Relative: 25.5 % (ref 12.0–46.0)
Lymphs Abs: 1.6 10*3/uL (ref 0.7–4.0)
MCHC: 32 g/dL (ref 30.0–36.0)
MCV: 102.3 fl — ABNORMAL HIGH (ref 78.0–100.0)
Monocytes Absolute: 0.4 10*3/uL (ref 0.1–1.0)
Monocytes Relative: 6.2 % (ref 3.0–12.0)
Neutro Abs: 4.1 10*3/uL (ref 1.4–7.7)
Neutrophils Relative %: 65.2 % (ref 43.0–77.0)
Platelets: 168 10*3/uL (ref 150.0–400.0)
RBC: 3.69 Mil/uL — ABNORMAL LOW (ref 3.87–5.11)
RDW: 16.4 % — ABNORMAL HIGH (ref 11.5–15.5)
WBC: 6.3 10*3/uL (ref 4.0–10.5)

## 2022-10-12 LAB — COMPREHENSIVE METABOLIC PANEL
ALT: 43 U/L — ABNORMAL HIGH (ref 0–35)
AST: 52 U/L — ABNORMAL HIGH (ref 0–37)
Albumin: 4 g/dL (ref 3.5–5.2)
Alkaline Phosphatase: 85 U/L (ref 39–117)
BUN: 31 mg/dL — ABNORMAL HIGH (ref 6–23)
CO2: 27 meq/L (ref 19–32)
Calcium: 10.6 mg/dL — ABNORMAL HIGH (ref 8.4–10.5)
Chloride: 104 meq/L (ref 96–112)
Creatinine, Ser: 1.34 mg/dL — ABNORMAL HIGH (ref 0.40–1.20)
GFR: 37.73 mL/min — ABNORMAL LOW (ref >60.00)
Glucose, Bld: 186 mg/dL — ABNORMAL HIGH (ref 70–99)
Potassium: 5.2 meq/L — ABNORMAL HIGH (ref 3.5–5.1)
Sodium: 139 meq/L (ref 135–145)
Total Bilirubin: 0.7 mg/dL (ref 0.2–1.2)
Total Protein: 6.8 g/dL (ref 6.0–8.3)

## 2022-10-12 LAB — LIPID PANEL
Cholesterol: 175 mg/dL (ref 0–200)
HDL: 47.8 mg/dL (ref >39.00)
LDL Cholesterol: 53 mg/dL (ref 0–99)
NonHDL: 127.05
Total CHOL/HDL Ratio: 4
Triglycerides: 368 mg/dL — ABNORMAL HIGH (ref 0.0–149.0)
VLDL: 73.6 mg/dL — ABNORMAL HIGH (ref 0.0–40.0)

## 2022-10-12 LAB — MICROALBUMIN / CREATININE URINE RATIO
Creatinine,U: 90.8 mg/dL
Microalb Creat Ratio: 1.1 mg/g (ref 0.0–30.0)
Microalb, Ur: 1 mg/dL (ref 0.0–1.9)

## 2022-10-12 LAB — HEMOGLOBIN A1C: Hgb A1c MFr Bld: 6.8 % — ABNORMAL HIGH (ref 4.6–6.5)

## 2022-10-12 LAB — LIPASE: Lipase: 130 U/L — ABNORMAL HIGH (ref 11.0–59.0)

## 2022-10-12 LAB — TSH: TSH: 2.58 u[IU]/mL (ref 0.35–5.50)

## 2022-10-12 LAB — URIC ACID: Uric Acid, Serum: 4 mg/dL (ref 2.4–7.0)

## 2022-10-12 NOTE — Assessment & Plan Note (Signed)
Supplement and monitor 

## 2022-10-12 NOTE — Assessment & Plan Note (Signed)
Hydrate and monitor 

## 2022-10-12 NOTE — Assessment & Plan Note (Signed)
Well controlled, no changes to meds. Encouraged heart healthy diet such as the DASH diet and exercise as tolerated.  °

## 2022-10-12 NOTE — Assessment & Plan Note (Signed)
Encourage heart healthy diet such as MIND or DASH diet, increase exercise, avoid trans fats, simple carbohydrates and processed foods, consider a krill or fish or flaxseed oil cap daily.  °

## 2022-10-12 NOTE — Assessment & Plan Note (Signed)
Well controlled, no changes to meds. Encouraged heart healthy diet such as the DASH diet and exercise as tolerated. Well controlled, no changes to meds. Encouraged heart healthy diet such as the DASH diet and exercise as tolerated.

## 2022-10-12 NOTE — Assessment & Plan Note (Addendum)
Patient encouraged to maintain heart healthy diet, regular exercise, adequate sleep. Consider daily probiotics. Take medications as prescribed. Labs ordered and reviewed. Dexa June 2022 repeat in 2 years, [facility] March 2023 repeat next year. Given and reviewed copy of ACP documents from [employer] and encouraged to complete and return. Has aged out of screening colonoscopy

## 2022-10-12 NOTE — Assessment & Plan Note (Signed)
Encouraged DASH diet, decrease po intake and increase exercise as tolerated. Needs 7-8 hours of sleep nightly. Avoid trans fats, eat small, frequent meals every 4-5 hours with lean proteins, complex carbs and healthy fats. Minimize simple carbs 

## 2022-10-12 NOTE — Assessment & Plan Note (Signed)
Suffered an injury while getting out of the bed of her truck and is now following with [facility], Dr Karen Chafe. She is in a walking boot at present.

## 2022-10-12 NOTE — Assessment & Plan Note (Signed)
No recent exacerbation no changes 

## 2022-10-12 NOTE — Assessment & Plan Note (Signed)
hgba1c acceptable, minimize simple carbs. Increase exercise as tolerated. Continue current meds 

## 2022-10-12 NOTE — Assessment & Plan Note (Signed)
Encouraged to get adequate exercise, calcium and vitamin d intake 

## 2022-10-12 NOTE — Assessment & Plan Note (Signed)
Follows with cardiology 

## 2022-10-12 NOTE — Patient Instructions (Addendum)
4000, 8000 steps daily     Preventive Care 65 Years and Older, Female Preventive care refers to lifestyle choices and visits with your health care provider that can promote health and wellness. Preventive care visits are also called wellness exams. What can I expect for my preventive care visit? Counseling Your health care provider may ask you questions about your: Medical history, including: Past medical problems. Family medical history. Pregnancy and menstrual history. History of falls. Current health, including: Memory and ability to understand (cognition). Emotional well-being. Home life and relationship well-being. Sexual activity and sexual health. Lifestyle, including: Alcohol, nicotine or tobacco, and drug use. Access to firearms. Diet, exercise, and sleep habits. Work and work Astronomer. Sunscreen use. Safety issues such as seatbelt and bike helmet use. Physical exam Your health care provider will check your: Height and weight. These may be used to calculate your BMI (body mass index). BMI is a measurement that tells if you are at a healthy weight. Waist circumference. This measures the distance around your waistline. This measurement also tells if you are at a healthy weight and may help predict your risk of certain diseases, such as type 2 diabetes and high blood pressure. Heart rate and blood pressure. Body temperature. Skin for abnormal spots. What immunizations do I need?  Vaccines are usually given at various ages, according to a schedule. Your health care provider will recommend vaccines for you based on your age, medical history, and lifestyle or other factors, such as travel or where you work. What tests do I need? Screening Your health care provider may recommend screening tests for certain conditions. This may include: Lipid and cholesterol levels. Hepatitis C test. Hepatitis B test. HIV (human immunodeficiency virus) test. STI (sexually transmitted  infection) testing, if you are at risk. Lung cancer screening. Colorectal cancer screening. Diabetes screening. This is done by checking your blood sugar (glucose) after you have not eaten for a while (fasting). Mammogram. Talk with your health care provider about how often you should have regular mammograms. BRCA-related cancer screening. This may be done if you have a family history of breast, ovarian, tubal, or peritoneal cancers. Bone density scan. This is done to screen for osteoporosis. Talk with your health care provider about your test results, treatment options, and if necessary, the need for more tests. Follow these instructions at home: Eating and drinking  Eat a diet that includes fresh fruits and vegetables, whole grains, lean protein, and low-fat dairy products. Limit your intake of foods with high amounts of sugar, saturated fats, and salt. Take vitamin and mineral supplements as recommended by your health care provider. Do not drink alcohol if your health care provider tells you not to drink. If you drink alcohol: Limit how much you have to 0-1 drink a day. Know how much alcohol is in your drink. In the [place] one drink equals one 12 oz bottle of beer (355 mL), one 5 oz glass of wine (148 mL), or one 1 oz glass of hard liquor (44 mL). Lifestyle Brush your teeth every morning and night with fluoride toothpaste. Floss one time each day. Exercise for at least 30 minutes 5 or more days each week. Do not use any products that contain nicotine or tobacco. These products include cigarettes, chewing tobacco, and vaping devices, such as e-cigarettes. If you need help quitting, ask your health care provider. Do not use drugs. If you are sexually active, practice safe sex. Use a condom or other form of protection in order to  prevent STIs. Take aspirin only as told by your health care provider. Make sure that you understand how much to take and what form to take. Work with your health care  provider to find out whether it is safe and beneficial for you to take aspirin daily. Ask your health care provider if you need to take a cholesterol-lowering medicine (statin). Find healthy ways to manage stress, such as: Meditation, yoga, or listening to music. Journaling. Talking to a trusted person. Spending time with friends and family. Minimize exposure to UV radiation to reduce your risk of skin cancer. Safety Always wear your seat belt while driving or riding in a vehicle. Do not drive: If you have been drinking alcohol. Do not ride with someone who has been drinking. When you are tired or distracted. While texting. If you have been using any mind-altering substances or drugs. Wear a helmet and other protective equipment during sports activities. If you have firearms in your house, make sure you follow all gun safety procedures. What's next? Visit your health care provider once a year for an annual wellness visit. Ask your health care provider how often you should have your eyes and teeth checked. Stay up to date on all vaccines. This information is not intended to replace advice given to you by your health care provider. Make sure you discuss any questions you have with your health care provider. Document Revised: 07/14/2020 Document Reviewed: 07/14/2020 Elsevier Patient Education  2024 [employer].

## 2022-10-12 NOTE — Assessment & Plan Note (Signed)
Recheck today. 

## 2022-10-13 ENCOUNTER — Other Ambulatory Visit: Payer: Self-pay | Admitting: Family Medicine

## 2022-10-13 ENCOUNTER — Encounter: Payer: Self-pay | Admitting: Family Medicine

## 2022-10-13 NOTE — Progress Notes (Signed)
Subjective:    Patient ID: Amanda Crawford, female    DOB: [birth year], 77 y.o.   MRN: [id number]  Chief Complaint  Patient presents with  . Annual Exam    Annual Exam    HPI Discussed the use of AI scribe software for clinical note transcription with the patient, who gave verbal consent to proceed.  History of Present Illness   The patient, with a history of C. diff infection, reports having stayed at home for two weeks due to the infection. She expresses frustration about the lack of clarity regarding the duration of infectivity. The patient also discusses the importance of antibiotics in fighting off infections and the role of bacteria in the human body.  In addition to the C. diff infection, the patient reports a recent fall that resulted in a fracture of the fifth metatarsal. She describes the incident and the subsequent bruising and swelling. The patient mentions that she has been seen by a doctor for this injury and that healing is evident in recent x-rays.  The patient also mentions a recent hepatitis vaccine and upcoming hip surgery. She expresses some concern about the effects of aging, particularly in relation to injuries and recovery.        Past Medical History:  Diagnosis Date  . Allergic state 03/12/2015  . Arthritis   . Colon polyp 11/19/2014  . Dermatitis 03/12/2015  . Diarrhea 06/29/2016  . Diverticulitis   . DM (diabetes mellitus), type 2 (HCC) 06/29/2016  . GERD (gastroesophageal reflux disease)    occ  . Gout 03/12/2015  . H/O measles   . H/O mumps   . Headache(784.0)    migraines  . History of chicken pox 11/23/2014  . Hyperglycemia 06/29/2016  . Lung cancer (HCC)   . Migraine 11/23/2014  . Obesity 11/23/2014  . Pneumonia    child  . Preventative health care 09/12/2015  . Primary hypertension 12/15/2020  . Renal cell cancer (HCC)    renal cell ca dx 9/01 and 8/08;  . Renal insufficiency   . Shortness of breath    occ  . Skin lesion of breast  03/12/2015    Past Surgical History:  Procedure Laterality Date  . ABDOMINAL HYSTERECTOMY    . ANAL RECTAL MANOMETRY N/A 06/29/2021   Procedure: ANO RECTAL MANOMETRY;  Surgeon: Shellia Cleverly, DO;  Location: [facility];  Service: Gastroenterology;  Laterality: N/A;  . APPENDECTOMY    . CARDIAC CATHETERIZATION     yrs ago neg  . CHOLECYSTECTOMY    . COLONOSCOPY  2018  . ESOPHAGOGASTRODUODENOSCOPY     years ago  . IR RADIOLOGIST EVAL & MGMT  07/17/2018  . IR RADIOLOGIST EVAL & MGMT  09/12/2018  . IR RADIOLOGIST EVAL & MGMT  06/18/2019  . IR RADIOLOGIST EVAL & MGMT  01/13/2020  . IR RADIOLOGIST EVAL & MGMT  07/08/2020  . IR RADIOLOGIST EVAL & MGMT  10/12/2020  . KIDNEY SURGERY  2001   Removed of left kidney   . LUNG CANCER SURGERY Left 08  . RADIOLOGY WITH ANESTHESIA N/A 08/21/2018   Procedure: CT WITH ANESTHESIA RENAL CRYOABLATION;  Surgeon: Oley Balm, MD;  Location: [facility];  Service: Radiology;  Laterality: N/A;  . RENAL MASS EXCISION Left 01  . RIGHT/LEFT HEART CATH AND CORONARY ANGIOGRAPHY N/A 07/27/2016   Procedure: Right/Left Heart Cath and Coronary Angiography;  Surgeon: Marykay Lex, MD;  Location: [facility] INVASIVE CV LAB;  Service: Cardiovascular;  Laterality: N/A;  . THORACOTOMY Left  05/09/2012   Procedure: THORACOTOMY MAJOR;  Surgeon: Alleen Borne, MD;  Location: [facility] OR;  Service: Thoracic;  Laterality: Left;  Marland Kitchen VIDEO BRONCHOSCOPY N/A 05/09/2012   Procedure: VIDEO BRONCHOSCOPY;  Surgeon: Alleen Borne, MD;  Location: [facility] OR;  Service: Thoracic;  Laterality: N/A;  . WEDGE RESECTION Left 05/09/2012   Procedure: LEFT UPPER LOBE WEDGE RESECTION;  Surgeon: Alleen Borne, MD;  Location: [facility] OR;  Service: Thoracic;  Laterality: Left;    Family History  Problem Relation Age of Onset  . Dementia Mother   . Heart failure Father   . Diabetes Father   . Hyperlipidemia Father   . Heart disease Father   . Hypertension Father   . Asthma Brother   . Asthma Daughter   . Alcohol  abuse Maternal Aunt   . Colon cancer Paternal Aunt   . Cancer Paternal Grandmother   . Hearing loss Paternal Grandfather   . Stroke Paternal Grandfather     Social History   Socioeconomic History  . Marital status: Married    Spouse name: Not on file  . Number of children: Not on file  . Years of education: Not on file  . Highest education level: Not on file  Occupational History  . Occupation: retired  Tobacco Use  . Smoking status: Former    Current packs/day: 0.00    Average packs/day: 0.5 packs/day for 35.0 years (17.5 ttl pk-yrs)    Types: Cigarettes    Start date: 05/08/1971    Quit date: 05/08/2006    Years since quitting: 16.4  . Smokeless tobacco: Never  Vaping Use  . Vaping status: Never Used  Substance and Sexual Activity  . Alcohol use: Yes    Comment: seldomly  . Drug use: No  . Sexual activity: Not on file    Comment: lives with husband, no dietary restrictions.   Other Topics Concern  . Not on file  Social History Narrative  . Not on file   Social Determinants of Health   Financial Resource Strain: Low Risk  (07/05/2020)   Overall Financial Resource Strain (CARDIA)   . Difficulty of Paying Living Expenses: Not hard at all  Food Insecurity: No Food Insecurity (08/12/2022)   [facility]   Hunger Vital Sign   . Worried About Programme researcher, broadcasting/film/video in the Last Year: Never true   . Ran Out of Food in the Last Year: Never true  Transportation Needs: No Transportation Needs (08/12/2022)   Received from [facility] - Transportation   . Lack of Transportation (Medical): No   . Lack of Transportation (Non-Medical): No  Physical Activity: Inactive (07/05/2020)   Exercise Vital Sign   . Days of Exercise per Week: 0 days   . Minutes of Exercise per Session: 0 min  Stress: No Stress Concern Present (08/12/2022)   Received from [facility] - Occupational Stress Questionnaire   . Feeling of Stress :  Not at all  Social Connections: Unknown (06/06/2021)   [facility]   Social Network   . Social Network: Not on file  Intimate Partner Violence: Not At Risk (08/12/2022)   Received from [facility]   HITS   . Over the last 12 months how often did your partner physically hurt you?: 1   . Over the last 12 months how often did your partner insult you or talk down to you?: 1   .  Over the last 12 months how often did your partner threaten you with physical harm?: 1   . Over the last 12 months how often did your partner scream or curse at you?: 1    Outpatient Medications Prior to Visit  Medication Sig Dispense Refill  . allopurinol (ZYLOPRIM) 300 MG tablet TAKE 1 TABLET BY MOUTH EVERY DAY 90 tablet 1  . aspirin EC 81 MG tablet Take 81 mg by mouth daily.    Marland Kitchen atorvastatin (LIPITOR) 20 MG tablet Take 1 tablet (20 mg total) by mouth daily. 90 tablet 3  . BD PEN NEEDLE NANO 2ND GEN 32G X 4 MM MISC USE AS DIRECTED WITH LANTUS ONCE A DAY. 100 each 1  . Calcium Carb-Cholecalciferol (CALCIUM 600 + D PO) Take 1 tablet by mouth daily.    . carvedilol (COREG) 3.125 MG tablet Take 1 tablet (3.125 mg total) by mouth 2 (two) times daily with a meal. 60 tablet 3  . diphenhydramine-acetaminophen (TYLENOL PM) 25-500 MG TABS Take 1 tablet by mouth at bedtime as needed (sleep).    . furosemide (LASIX) 40 MG tablet TAKE 1 TABLET BY MOUTH EVERY DAY 90 tablet 3  . glimepiride (AMARYL) 2 MG tablet TAKE 1 TABLET BY MOUTH EVERY DAY WITH BREAKFAST 90 tablet 1  . glucose blood (ONETOUCH VERIO) test strip Check Blood Sugar 3 times daily. 100 strip 4  . hyoscyamine (LEVSIN SL) 0.125 MG SL tablet Place 1 tablet (0.125 mg total) under the tongue every 6 (six) hours as needed. 30 tablet 1  . insulin glargine (LANTUS SOLOSTAR) 100 UNIT/ML Solostar Pen INJECT 10 UNITS INTO THE SKIN DAILY (Patient taking differently: Inject 10 Units into the skin daily. Pt taking 12 units QHS) 15 mL 1  . irbesartan  (AVAPRO) 75 MG tablet Take 1 tablet (75 mg total) by mouth daily. 90 tablet 3  . Lancets (ONETOUCH DELICA PLUS LANCET33G) MISC USE AS DIRECTED TO TEST DAILY 100 each 12  . meclizine (ANTIVERT) 12.5 MG tablet Take 1 tablet (12.5 mg total) by mouth 3 (three) times daily as needed for dizziness. As needed for   Dizziness or nausea 30 tablet 1  . nystatin cream (MYCOSTATIN) Apply 1 application topically 2 (two) times daily. 30 g 1  . Vitamin D, Ergocalciferol, (DRISDOL) 1.25 MG (50000 UNIT) CAPS capsule TAKE 1 CAPSULE (50,000 UNITS TOTAL) BY MOUTH EVERY 7 (SEVEN) DAYS 12 capsule 1  . cefdinir (OMNICEF) 300 MG capsule Take 1 capsule (300 mg total) by mouth 2 (two) times daily. 10 capsule 0   No facility-administered medications prior to visit.    No Known Allergies  ROS     Objective:    Physical Exam Constitutional:      General: She is not in acute distress.    Appearance: Normal appearance. She is not diaphoretic.  HENT:     Head: Normocephalic and atraumatic.     Right Ear: Tympanic membrane, ear canal and external ear normal.     Left Ear: Tympanic membrane, ear canal and external ear normal.     Nose: Nose normal.     Mouth/Throat:     Mouth: Mucous membranes are moist.     Pharynx: Oropharynx is clear. No oropharyngeal exudate.  Eyes:     General: No scleral icterus.       Right eye: No discharge.        Left eye: No discharge.     Conjunctiva/sclera: Conjunctivae normal.     Pupils: Pupils are  equal, round, and reactive to light.  Neck:     Thyroid: No thyromegaly.  Cardiovascular:     Rate and Rhythm: Normal rate and regular rhythm.     Heart sounds: Murmur heard.  Pulmonary:     Effort: Pulmonary effort is normal. No respiratory distress.     Breath sounds: Normal breath sounds. No wheezing or rales.  Abdominal:     General: Bowel sounds are normal. There is no distension.     Palpations: Abdomen is soft. There is no mass.     Tenderness: There is no abdominal  tenderness.  Musculoskeletal:        General: No tenderness. Normal range of motion.     Cervical back: Normal range of motion and neck supple.  Lymphadenopathy:     Cervical: No cervical adenopathy.  Skin:    General: Skin is warm and dry.     Findings: No rash.  Neurological:     General: No focal deficit present.     Mental Status: She is alert and oriented to person, place, and time.     Cranial Nerves: No cranial nerve deficit.     Coordination: Coordination normal.     Deep Tendon Reflexes: Reflexes are normal and symmetric. Reflexes normal.  Psychiatric:        Mood and Affect: Mood normal.        Behavior: Behavior normal.        Thought Content: Thought content normal.        Judgment: Judgment normal.   BP 122/62 (BP Location: Left Arm, Patient Position: Sitting, Cuff Size: Normal)   Pulse 83   Temp (!) 97.5 F (36.4 C) (Oral)   Resp 16   Ht 5\' 4"  (1.626 m)   Wt 213 lb (96.6 kg)   SpO2 98%   BMI 36.56 kg/m  Wt Readings from Last 3 Encounters:  10/12/22 213 lb (96.6 kg)  09/04/22 213 lb 9.6 oz (96.9 kg)  08/22/22 209 lb 12.8 oz (95.2 kg)    Diabetic Foot Exam - Simple   No data filed    Lab Results  Component Value Date   WBC 6.3 10/12/2022   HGB 12.1 10/12/2022   HCT 37.8 10/12/2022   PLT 168.0 10/12/2022   GLUCOSE 186 (H) 10/12/2022   CHOL 175 10/12/2022   TRIG 368.0 (H) 10/12/2022   HDL 47.80 10/12/2022   LDLDIRECT 71.0 04/11/2022   LDLCALC 53 10/12/2022   ALT 43 (H) 10/12/2022   AST 52 (H) 10/12/2022   NA 139 10/12/2022   K 5.2 (H) 10/12/2022   CL 104 10/12/2022   CREATININE 1.34 (H) 10/12/2022   BUN 31 (H) 10/12/2022   CO2 27 10/12/2022   TSH 2.58 10/12/2022   INR 1.1 (H) 03/22/2021   HGBA1C 6.8 (H) 10/12/2022   MICROALBUR 1.0 10/12/2022    Lab Results  Component Value Date   TSH 2.58 10/12/2022   Lab Results  Component Value Date   WBC 6.3 10/12/2022   HGB 12.1 10/12/2022   HCT 37.8 10/12/2022   MCV 102.3 (H) 10/12/2022   PLT  168.0 10/12/2022   Lab Results  Component Value Date   NA 139 10/12/2022   K 5.2 (H) 10/12/2022   CHLORIDE 104 11/01/2016   CO2 27 10/12/2022   GLUCOSE 186 (H) 10/12/2022   BUN 31 (H) 10/12/2022   CREATININE 1.34 (H) 10/12/2022   BILITOT 0.7 10/12/2022   ALKPHOS 85 10/12/2022   AST 52 (H) 10/12/2022  ALT 43 (H) 10/12/2022   PROT 6.8 10/12/2022   ALBUMIN 4.0 10/12/2022   CALCIUM 10.6 (H) 10/12/2022   ANIONGAP 5 12/01/2021   EGFR 50 (L) 11/01/2016   GFR 37.73 (L) 10/12/2022   Lab Results  Component Value Date   CHOL 175 10/12/2022   Lab Results  Component Value Date   HDL 47.80 10/12/2022   Lab Results  Component Value Date   LDLCALC 53 10/12/2022   Lab Results  Component Value Date   TRIG 368.0 (H) 10/12/2022   Lab Results  Component Value Date   CHOLHDL 4 10/12/2022   Lab Results  Component Value Date   HGBA1C 6.8 (H) 10/12/2022       Assessment & Plan:  Anemia, unspecified type Assessment & Plan: Supplement and monitor    Chronic systolic heart failure (HCC) Assessment & Plan: No recent exacerbation no changes  Orders: -     CBC with Differential/Platelet  Type 2 diabetes mellitus without complication, without long-term current use of insulin (HCC) Assessment & Plan: hgba1c acceptable, minimize simple carbs. Increase exercise as tolerated. Continue current meds   Orders: -     Comprehensive metabolic panel -     Hemoglobin A1c -     Microalbumin / creatinine urine ratio  Elevated lipase Assessment & Plan: Recheck today  Orders: -     Lipase  Chronic gout without tophus, unspecified cause, unspecified site Assessment & Plan: Hydrate and monitor  Orders: -     Uric acid  Hyperlipidemia, unspecified hyperlipidemia type Assessment & Plan: Encourage heart healthy diet such as MIND or DASH diet, increase exercise, avoid trans fats, simple carbohydrates and processed foods, consider a krill or fish or flaxseed oil cap daily.     Orders: -     Lipid panel  Morbid obesity (HCC) Assessment & Plan: Encouraged DASH diet, decrease po intake and increase exercise as tolerated. Needs 7-8 hours of sleep nightly. Avoid trans fats, eat small, frequent meals every 4-5 hours with lean proteins, complex carbs and healthy fats. Minimize simple carbs   Osteopenia, unspecified location Assessment & Plan: Encouraged to get adequate exercise, calcium and vitamin d intake   Nonischemic cardiomyopathy [facility]) Assessment & Plan: Follows with cardiology   Preventative health care Assessment & Plan: Patient encouraged to maintain heart healthy diet, regular exercise, adequate sleep. Consider daily probiotics. Take medications as prescribed. Labs ordered and reviewed. Dexa June 2022 repeat in 2 years, [facility] March 2023 repeat next year. Given and reviewed copy of ACP documents from [employer] and encouraged to complete and return. Has aged out of screening colonoscopy   Primary hypertension Assessment & Plan: Well controlled, no changes to meds. Encouraged heart healthy diet such as the DASH diet and exercise as tolerated. Well controlled, no changes to meds. Encouraged heart healthy diet such as the DASH diet and exercise as tolerated.   Orders: -     TSH  Renal insufficiency Assessment & Plan: Well controlled, no changes to meds. Encouraged heart healthy diet such as the DASH diet and exercise as tolerated.     Vitamin D deficiency Assessment & Plan: Hydrate and monitor   Orders: -     Vitamin D 1,25 dihydroxy  Right foot pain Assessment & Plan: Suffered an injury while getting out of the bed of her truck and is now following with [facility], Dr Karen Chafe. She is in a walking boot at present.     Assessment and Plan  Clostridium difficile infection Recovering from recent infection. Discussed the role of antibiotics and gut flora in the development of C. diff overgrowth. -Continue current treatment  plan and monitor for recurrence of symptoms.  Fracture of the fifth metatarsal Healing well with evidence of bone growth on recent imaging. -Follow-up with orthopedic specialist in two weeks for further imaging and assessment.  Right shoulder pain Possible musculoskeletal issue. No immediate intervention required. -Consider referral to [facility] if symptoms persist or worsen.  Osteopenia Last bone density scan in 2022 showed osteopenia. -Plan for repeat bone density scan in March 2025.  Breast cancer screening Last mammogram in March 2023. -Plan for repeat mammogram in March 2025.  Hepatitis B vaccination Recently started series. -Next dose due on November 01, 2022.  Influenza vaccination Last received in February 2024. -Plan to administer next dose in October 2024.  General Health Maintenance / Followup Plans -Order routine blood work including lipase, glucose, and thyroid function tests. -Plan for follow-up visit in January 2025 to discuss results and coordinate mammogram and bone density scan. -Continue regular dental visits.         Danise Edge, MD

## 2022-10-15 LAB — VITAMIN D 1,25 DIHYDROXY
Vitamin D 1, 25 (OH)2 Total: 8 pg/mL — ABNORMAL LOW (ref 18–72)
Vitamin D2 1, 25 (OH)2: <8 pg/mL
Vitamin D3 1, 25 (OH)2: 8 pg/mL

## 2022-10-17 ENCOUNTER — Other Ambulatory Visit: Payer: Self-pay

## 2022-10-17 MED ORDER — VITAMIN D (ERGOCALCIFEROL) 1.25 MG (50000 UNIT) PO CAPS: 50000.0000 [IU] | ORAL_CAPSULE | ORAL | 4 refills | Status: DC

## 2022-10-19 ENCOUNTER — Other Ambulatory Visit (INDEPENDENT_AMBULATORY_CARE_PROVIDER_SITE_OTHER): Payer: No Typology Code available for payment source

## 2022-10-19 DIAGNOSIS — I10 Essential (primary) hypertension: Secondary | ICD-10-CM | POA: Diagnosis not present

## 2022-10-19 LAB — COMPREHENSIVE METABOLIC PANEL
ALT: 30 U/L (ref 0–35)
AST: 34 U/L (ref 0–37)
Albumin: 3.9 g/dL (ref 3.5–5.2)
Alkaline Phosphatase: 74 U/L (ref 39–117)
BUN: 36 mg/dL — ABNORMAL HIGH (ref 6–23)
CO2: 25 mEq/L (ref 19–32)
Calcium: 10.4 mg/dL (ref 8.4–10.5)
Chloride: 105 mEq/L (ref 96–112)
Creatinine, Ser: 1.28 mg/dL — ABNORMAL HIGH (ref 0.40–1.20)
GFR: 39.86 mL/min — ABNORMAL LOW (ref >60.00)
Glucose, Bld: 182 mg/dL — ABNORMAL HIGH (ref 70–99)
Potassium: 4.5 mEq/L (ref 3.5–5.1)
Sodium: 139 mEq/L (ref 135–145)
Total Bilirubin: 0.7 mg/dL (ref 0.2–1.2)
Total Protein: 6.5 g/dL (ref 6.0–8.3)

## 2022-10-20 ENCOUNTER — Other Ambulatory Visit: Payer: Self-pay

## 2022-10-20 DIAGNOSIS — R748 Abnormal levels of other serum enzymes: Secondary | ICD-10-CM

## 2022-10-24 DIAGNOSIS — M79671 Pain in right foot: Secondary | ICD-10-CM | POA: Diagnosis not present

## 2022-11-01 DIAGNOSIS — Z23 Encounter for immunization: Secondary | ICD-10-CM | POA: Diagnosis not present

## 2022-11-01 DIAGNOSIS — K7460 Unspecified cirrhosis of liver: Secondary | ICD-10-CM | POA: Diagnosis not present

## 2022-11-07 DIAGNOSIS — M79671 Pain in right foot: Secondary | ICD-10-CM | POA: Diagnosis not present

## 2022-11-17 ENCOUNTER — Other Ambulatory Visit (INDEPENDENT_AMBULATORY_CARE_PROVIDER_SITE_OTHER): Payer: No Typology Code available for payment source

## 2022-11-17 DIAGNOSIS — R748 Abnormal levels of other serum enzymes: Secondary | ICD-10-CM

## 2022-11-17 LAB — LIPASE: Lipase: 106 U/L — ABNORMAL HIGH (ref 11.0–59.0)

## 2022-11-18 ENCOUNTER — Other Ambulatory Visit: Payer: Self-pay | Admitting: Family Medicine

## 2022-11-19 ENCOUNTER — Other Ambulatory Visit: Payer: Self-pay | Admitting: Family Medicine

## 2022-11-19 DIAGNOSIS — R748 Abnormal levels of other serum enzymes: Secondary | ICD-10-CM

## 2022-11-24 DIAGNOSIS — H35361 Drusen (degenerative) of macula, right eye: Secondary | ICD-10-CM | POA: Diagnosis not present

## 2022-11-24 DIAGNOSIS — H04123 Dry eye syndrome of bilateral lacrimal glands: Secondary | ICD-10-CM | POA: Diagnosis not present

## 2022-11-24 DIAGNOSIS — H25813 Combined forms of age-related cataract, bilateral: Secondary | ICD-10-CM | POA: Diagnosis not present

## 2022-11-24 DIAGNOSIS — H40013 Open angle with borderline findings, low risk, bilateral: Secondary | ICD-10-CM | POA: Diagnosis not present

## 2022-11-24 DIAGNOSIS — H524 Presbyopia: Secondary | ICD-10-CM | POA: Diagnosis not present

## 2022-11-24 LAB — HM DIABETES EYE EXAM

## 2022-11-25 DIAGNOSIS — H524 Presbyopia: Secondary | ICD-10-CM | POA: Diagnosis not present

## 2022-11-28 ENCOUNTER — Other Ambulatory Visit: Payer: Self-pay | Admitting: Pulmonary Disease

## 2022-11-28 ENCOUNTER — Encounter: Payer: Self-pay | Admitting: Pulmonary Disease

## 2022-11-28 ENCOUNTER — Ambulatory Visit (INDEPENDENT_AMBULATORY_CARE_PROVIDER_SITE_OTHER): Payer: No Typology Code available for payment source | Admitting: Pulmonary Disease

## 2022-11-28 VITALS — BP 89/48 | HR 77 | Temp 97.7°F | Ht 64.0 in | Wt 214.0 lb

## 2022-11-28 DIAGNOSIS — G478 Other sleep disorders: Secondary | ICD-10-CM | POA: Diagnosis not present

## 2022-11-28 DIAGNOSIS — R0683 Snoring: Secondary | ICD-10-CM | POA: Diagnosis not present

## 2022-11-28 MED ORDER — RAMELTEON 8 MG PO TABS: 8.0000 mg | ORAL_TABLET | Freq: Every day | ORAL | 1 refills | Status: DC

## 2022-11-28 NOTE — Patient Instructions (Signed)
I will see you back in about 8 weeks  Prescription for Rozerem which can help you fall asleep  Behavioral modifications to help you decompress her mind will help  Regular exercises as able  Schedule for home sleep study  Call us with significant concerns

## 2022-11-28 NOTE — Progress Notes (Signed)
Amanda Crawford    [id number]    Sep 09, 1945  Primary Care Physician:[name], Bryon Lions, MD  Referring Physician: [name], MD [address] [place],  [place] [address]  Chief complaint:   Patient is being seen for insomnia  HPI:  Chronic insomnia over 3 to 4 years Does not recollect any significant trigger Unable to shut her mind down to get some sleep  Does have a history of snoring No history of witnessed apneas She is tired during the day not particularly sleepy Will take a nap if the day permits  Reformed smoker quit about 2008  Has a history of kidney cancer for which she had a resection, history of lung cancer for which she had 2 resections, first in 2008, second one in 2014  Has never used any sleep aids, occasionally takes Tylenol PM  Usually goes to bed about 11 PM, sometimes takes up to 2 hours before she is able to fall asleep 2-3 awakenings  Variable awakening time between 9 and 10 AM  Weight has been stable, may be up about 5 pounds  Does have dryness of the mouth in the morning No morning headaches Occasional night sweats Never been told about apneic breathing   Outpatient Encounter Medications as of 11/28/2022  Medication Sig   allopurinol (ZYLOPRIM) 300 MG tablet TAKE 1 TABLET BY MOUTH EVERY DAY   aspirin EC 81 MG tablet Take 81 mg by mouth daily.   atorvastatin (LIPITOR) 20 MG tablet Take 1 tablet (20 mg total) by mouth daily.   Calcium Carb-Cholecalciferol (CALCIUM 600 + D PO) Take 1 tablet by mouth daily.   carvedilol (COREG) 3.125 MG tablet Take 1 tablet (3.125 mg total) by mouth 2 (two) times daily with a meal.   diphenhydramine-acetaminophen (TYLENOL PM) 25-500 MG TABS Take 1 tablet by mouth at bedtime as needed (sleep).   furosemide (LASIX) 40 MG tablet TAKE 1 TABLET BY MOUTH EVERY DAY   glimepiride (AMARYL) 2 MG tablet TAKE 1 TABLET BY MOUTH EVERY DAY WITH BREAKFAST   glucose blood (ONETOUCH VERIO) test strip  Check Blood Sugar 3 times daily.   hyoscyamine (LEVSIN SL) 0.125 MG SL tablet Place 1 tablet (0.125 mg total) under the tongue every 6 (six) hours as needed.   insulin glargine (LANTUS SOLOSTAR) 100 UNIT/ML Solostar Pen INJECT 10 UNITS INTO THE SKIN DAILY (Patient taking differently: Inject 10 Units into the skin daily. Pt taking 12 units QHS)   Insulin Pen Needle (BD PEN NEEDLE NANO 2ND GEN) 32G X 4 MM MISC USE AS DIRECTED WITH LANTUS ONCE A DAY.   irbesartan (AVAPRO) 75 MG tablet Take 1 tablet (75 mg total) by mouth daily.   Lancets (ONETOUCH DELICA PLUS LANCET33G) MISC USE AS DIRECTED TO TEST DAILY   meclizine (ANTIVERT) 12.5 MG tablet Take 1 tablet (12.5 mg total) by mouth 3 (three) times daily as needed for dizziness. As needed for   Dizziness or nausea   nystatin cream (MYCOSTATIN) Apply 1 application topically 2 (two) times daily.   Vitamin D, Ergocalciferol, (DRISDOL) 1.25 MG (50000 UNIT) CAPS capsule Take 1 capsule (50,000 Units total) by mouth every 7 (seven) days. TAKE 1 CAPSULE (50,000 UNITS TOTAL) BY MOUTH EVERY 7 (SEVEN) DAYS   [DISCONTINUED] glyBURIDE-metformin (GLUCOVANCE) 1.25-250 MG tablet Take 1 tablet by mouth 2 (two) times daily with a meal.   No facility-administered encounter medications on file as of 11/28/2022.    Allergies  as of 11/28/2022   (No Known Allergies)    Past Medical History:  Diagnosis Date   Allergic state 03/12/2015   Arthritis    Colon polyp 11/19/2014   Dermatitis 03/12/2015   Diarrhea 06/29/2016   Diverticulitis    DM (diabetes mellitus), type 2 (HCC) 06/29/2016   GERD (gastroesophageal reflux disease)    occ   Gout 03/12/2015   H/O measles    H/O mumps    Headache(784.0)    migraines   History of chicken pox 11/23/2014   Hyperglycemia 06/29/2016   Lung cancer (HCC)    Migraine 11/23/2014   Obesity 11/23/2014   Pneumonia    child   Preventative health care 09/12/2015   Primary hypertension 12/15/2020   Renal cell cancer (HCC)    renal  cell ca dx 9/01 and 8/08;   Renal insufficiency    Shortness of breath    occ   Skin lesion of breast 03/12/2015    Past Surgical History:  Procedure Laterality Date   ABDOMINAL HYSTERECTOMY     ANAL RECTAL MANOMETRY N/A 06/29/2021   Procedure: ANO RECTAL MANOMETRY;  Surgeon: Shellia Cleverly, DO;  Location: [facility];  Service: Gastroenterology;  Laterality: N/A;   APPENDECTOMY     CARDIAC CATHETERIZATION     yrs ago neg   CHOLECYSTECTOMY     COLONOSCOPY  2018   ESOPHAGOGASTRODUODENOSCOPY     years ago   IR RADIOLOGIST EVAL & MGMT  07/17/2018   IR RADIOLOGIST EVAL & MGMT  09/12/2018   IR RADIOLOGIST EVAL & MGMT  06/18/2019   IR RADIOLOGIST EVAL & MGMT  01/13/2020   IR RADIOLOGIST EVAL & MGMT  07/08/2020   IR RADIOLOGIST EVAL & MGMT  10/12/2020   KIDNEY SURGERY  2001   Removed of left kidney    LUNG CANCER SURGERY Left 08   RADIOLOGY WITH ANESTHESIA N/A 08/21/2018   Procedure: CT WITH ANESTHESIA RENAL CRYOABLATION;  Surgeon: Oley Balm, MD;  Location: [facility];  Service: Radiology;  Laterality: N/A;   RENAL MASS EXCISION Left 01   RIGHT/LEFT HEART CATH AND CORONARY ANGIOGRAPHY N/A 07/27/2016   Procedure: Right/Left Heart Cath and Coronary Angiography;  Surgeon: Marykay Lex, MD;  Location: [facility] INVASIVE CV LAB;  Service: Cardiovascular;  Laterality: N/A;   THORACOTOMY Left 05/09/2012   Procedure: THORACOTOMY MAJOR;  Surgeon: Alleen Borne, MD;  Location: [facility] OR;  Service: Thoracic;  Laterality: Left;   VIDEO BRONCHOSCOPY N/A 05/09/2012   Procedure: VIDEO BRONCHOSCOPY;  Surgeon: Alleen Borne, MD;  Location: [facility] OR;  Service: Thoracic;  Laterality: N/A;   WEDGE RESECTION Left 05/09/2012   Procedure: LEFT UPPER LOBE WEDGE RESECTION;  Surgeon: Alleen Borne, MD;  Location: [facility] OR;  Service: Thoracic;  Laterality: Left;    Family History  Problem Relation Age of Onset   Dementia Mother    Heart failure Father    Diabetes Father    Hyperlipidemia Father    Heart disease Father     Hypertension Father    Asthma Brother    Asthma Daughter    Alcohol abuse Maternal Aunt    Colon cancer Paternal Aunt    Cancer Paternal Grandmother    Hearing loss Paternal Grandfather    Stroke Paternal Grandfather     Social History   Socioeconomic History   Marital status: Married    Spouse name: Not on file   Number of children: Not on file   Years of education: Not on file  Highest education level: Not on file  Occupational History   Occupation: retired  Tobacco Use   Smoking status: Former    Current packs/day: 0.00    Average packs/day: 0.5 packs/day for 35.0 years (17.5 ttl pk-yrs)    Types: Cigarettes    Start date: 05/08/1971    Quit date: 05/08/2006    Years since quitting: 16.5   Smokeless tobacco: Never  Vaping Use   Vaping status: Never Used  Substance and Sexual Activity   Alcohol use: Yes    Comment: seldomly   Drug use: No   Sexual activity: Not on file    Comment: lives with husband, no dietary restrictions.   Other Topics Concern   Not on file  Social History Narrative   Not on file   Social Determinants of Health   Financial Resource Strain: Low Risk  (07/05/2020)   Overall Financial Resource Strain (CARDIA)    Difficulty of Paying Living Expenses: Not hard at all  Food Insecurity: No Food Insecurity (08/12/2022)   [facility]   Hunger Vital Sign    Worried About Running Out of Food in the Last Year: Never true    Ran Out of Food in the Last Year: Never true  Transportation Needs: No Transportation Needs (08/12/2022)   Received from [facility] - Transportation    Lack of Transportation (Medical): No    Lack of Transportation (Non-Medical): No  Physical Activity: Inactive (07/05/2020)   Exercise Vital Sign    Days of Exercise per Week: 0 days    Minutes of Exercise per Session: 0 min  Stress: No Stress Concern Present (08/12/2022)   Received from [facility] -  Occupational Stress Questionnaire    Feeling of Stress : Not at all  Social Connections: Unknown (06/06/2021)   Received from [facility]   Social Network    Social Network: Not on file  Intimate Partner Violence: Not At Risk (08/12/2022)   Received from [facility]   HITS    Over the last 12 months how often did your partner physically hurt you?: 1    Over the last 12 months how often did your partner insult you or talk down to you?: 1    Over the last 12 months how often did your partner threaten you with physical harm?: 1    Over the last 12 months how often did your partner scream or curse at you?: 1    Review of Systems  Constitutional:  Positive for fatigue.  Respiratory:  Negative for apnea and shortness of breath.   Psychiatric/Behavioral:  Positive for sleep disturbance.     Vitals:   11/28/22 1326  BP: (!) 89/48  Pulse: 77  Temp: 97.7 F (36.5 C)  SpO2: 93%     Physical Exam Constitutional:      Appearance: She is obese.  HENT:     Head: Normocephalic.     Mouth/Throat:     Mouth: Mucous membranes are moist.  Eyes:     General: No scleral icterus. Cardiovascular:     Rate and Rhythm: Normal rate and regular rhythm.     Heart sounds: No murmur heard.    No friction rub.  Pulmonary:     Breath sounds: No stridor. No wheezing or rhonchi.  Musculoskeletal:     Cervical back: No rigidity or tenderness.  Neurological:     Mental Status: She is alert.  Psychiatric:        Mood and Affect: Mood normal.       11/28/2022    1:00 PM  Results of the Epworth flowsheet  Sitting and reading 1  Watching TV 1  Sitting, inactive in a public place (e.g. a theatre or a meeting) 0  As a passenger in a car for an hour without a break 0  Lying down to rest in the afternoon when circumstances permit 0  Sitting and talking to someone 0  Sitting quietly after a lunch without alcohol 1  In a car, while stopped for a few minutes in traffic 0  Total  score 3    Data Reviewed: No previous sleep study on record  Assessment:  Insomnia  Snoring, nonrestorative sleep  Class II obesity  History of renal cancer in 2000, status post nephrectomy in 2001  Lung cancer x 2 for which she had resection via VATS  Post cryoablation of right renal cancer July 2020  Bronchiectasis on CT  Plan/Recommendations: Prescription for Rozerem for insomnia  Schedule for home sleep study -Patient stated she will not be able to sleep in the sleep lab  Follow-up in about 8 weeks  Encouraged BVR modifications to help with sleep habits     Virl Diamond MD [facility] 11/28/2022, 1:35 PM  CC: [name], MD

## 2022-11-29 ENCOUNTER — Other Ambulatory Visit: Payer: Self-pay | Admitting: Oncology

## 2022-11-29 ENCOUNTER — Other Ambulatory Visit: Payer: No Typology Code available for payment source

## 2022-11-29 ENCOUNTER — Ambulatory Visit (HOSPITAL_COMMUNITY)
Admission: RE | Admit: 2022-11-29 | Discharge: 2022-11-29 | Disposition: A | Discharge status: Home / Self Care | Payer: No Typology Code available for payment source | Source: Ambulatory Visit | Attending: Oncology | Admitting: Oncology

## 2022-11-29 DIAGNOSIS — K5730 Diverticulosis of large intestine without perforation or abscess without bleeding: Secondary | ICD-10-CM | POA: Diagnosis not present

## 2022-11-29 DIAGNOSIS — K766 Portal hypertension: Secondary | ICD-10-CM | POA: Diagnosis not present

## 2022-11-29 DIAGNOSIS — I700 Atherosclerosis of aorta: Secondary | ICD-10-CM | POA: Diagnosis not present

## 2022-11-29 DIAGNOSIS — D4989 Neoplasm of unspecified behavior of other specified sites: Secondary | ICD-10-CM | POA: Diagnosis not present

## 2022-11-29 DIAGNOSIS — R911 Solitary pulmonary nodule: Secondary | ICD-10-CM | POA: Insufficient documentation

## 2022-11-29 DIAGNOSIS — Z905 Acquired absence of kidney: Secondary | ICD-10-CM | POA: Insufficient documentation

## 2022-12-01 ENCOUNTER — Other Ambulatory Visit: Payer: Self-pay | Admitting: Pulmonary Disease

## 2022-12-01 ENCOUNTER — Telehealth: Payer: Self-pay | Admitting: *Deleted

## 2022-12-01 ENCOUNTER — Other Ambulatory Visit (HOSPITAL_COMMUNITY): Payer: Self-pay

## 2022-12-01 ENCOUNTER — Other Ambulatory Visit: Payer: Self-pay | Admitting: *Deleted

## 2022-12-01 DIAGNOSIS — D4989 Neoplasm of unspecified behavior of other specified sites: Secondary | ICD-10-CM

## 2022-12-01 NOTE — Telephone Encounter (Signed)
Name from pharmacy: RAMELTEON 8 MG TABLET       Will file in chart as: ramelteon (ROZEREM) 8 MG tablet   Sig: TAKE 1 TABLET BY MOUTH AT BEDTIME.   Disp: 30 tablet    Refills: 1   Start: 11/28/2022   Class: Normal   Non-formulary   Last ordered: 3 days ago (11/28/2022) by Tomma Lightning, MD   Last refill: 11/28/2022   Rx #: [id number]   Pharmacy comment: Alternative Requested:NOT COVERED.     To be filled at: [id number] - [place], [place] - [address]      Dr. Wynona Neat,  Patient's insurance does not cover this medication, pharmacy is requesting an alternative.  Please advise.  Thank you.

## 2022-12-01 NOTE — Telephone Encounter (Signed)
Test claim does not provide [place] with alternatives either. We are unable to see patient formulary. Patient will need to contact insurance to get information about what is or is not covered.

## 2022-12-01 NOTE — Telephone Encounter (Signed)
Can you tell us what is covered by her plan as an alternative to Ramelteon (Rozerem) 8 mg tablet?  Insurance does not cover this and did not give the pharmacy any alternatives.  Thank you.

## 2022-12-04 ENCOUNTER — Other Ambulatory Visit: Payer: Self-pay

## 2022-12-04 ENCOUNTER — Encounter: Payer: Self-pay | Admitting: Medical Oncology

## 2022-12-04 ENCOUNTER — Inpatient Hospital Stay: Payer: No Typology Code available for payment source

## 2022-12-04 ENCOUNTER — Inpatient Hospital Stay: Payer: No Typology Code available for payment source | Attending: Family | Admitting: Medical Oncology

## 2022-12-04 VITALS — BP 98/52 | HR 77 | Temp 97.8°F | Resp 18 | Ht 64.0 in | Wt 214.1 lb

## 2022-12-04 DIAGNOSIS — K766 Portal hypertension: Secondary | ICD-10-CM | POA: Insufficient documentation

## 2022-12-04 DIAGNOSIS — R918 Other nonspecific abnormal finding of lung field: Secondary | ICD-10-CM | POA: Diagnosis not present

## 2022-12-04 DIAGNOSIS — C642 Malignant neoplasm of left kidney, except renal pelvis: Secondary | ICD-10-CM | POA: Diagnosis not present

## 2022-12-04 DIAGNOSIS — K5730 Diverticulosis of large intestine without perforation or abscess without bleeding: Secondary | ICD-10-CM | POA: Diagnosis not present

## 2022-12-04 DIAGNOSIS — I700 Atherosclerosis of aorta: Secondary | ICD-10-CM | POA: Diagnosis not present

## 2022-12-04 DIAGNOSIS — C649 Malignant neoplasm of unspecified kidney, except renal pelvis: Secondary | ICD-10-CM

## 2022-12-04 DIAGNOSIS — Z79899 Other long term (current) drug therapy: Secondary | ICD-10-CM | POA: Insufficient documentation

## 2022-12-04 DIAGNOSIS — Z905 Acquired absence of kidney: Secondary | ICD-10-CM | POA: Insufficient documentation

## 2022-12-04 NOTE — Progress Notes (Signed)
Hematology and Oncology Follow Up   Amanda Crawford [id number] [birth year] 77 y.o. 12/04/2022 11:57 AM [name], MDBlyth, Bryon Lions, MD   Principle Diagnosis:  77 year old woman with left kidney cancer diagnosed in 2001.  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  He has not required any systemic treatment.     Secondary diagnosis: Right renal neoplasm diagnosed in July 2020.     Prior Therapy:  She is status post left nephrectomy in 2001.   She developed a left lingular mass, status post VATS procedure.  Pathology revealed a recurrent renal cell carcinoma resected in October 2008.    She developed relapse disease in 2014 and subsequently underwent a VATS and left upper lobe wedge resection of a tumor done on 05/09/2012. Pathology confirmed another relapse.  She is status post cryoablation of a right renal neoplasm on August 21, 2018.   Current therapy: Active surveillance.   Interim History: Mrs. Bacha is here for a follow-up visit.  She is new to our practice location but has previously been seen by Dr. Clelia Croft.   Since the last visit, she reports no major changes in her health.  She denies any chest pain or shortness of breath.  She denies any difficulty breathing.    She denies any abdominal pain or distention.  No urinary changes or flank pain. No hematuria. She denies any hospitalizations or illnesses.  No SOB, chest pain, peripheral edema.  Wt Readings from Last 3 Encounters:  12/04/22 214 lb 1.3 oz (97.1 kg)  11/28/22 214 lb (97.1 kg)  10/12/22 213 lb (96.6 kg)     Medications: Reviewed without changes. Current Outpatient Medications  Medication Sig Dispense Refill   allopurinol (ZYLOPRIM) 300 MG tablet TAKE 1 TABLET BY MOUTH EVERY DAY 90 tablet 1   aspirin EC 81 MG tablet Take 81 mg by mouth daily.     atorvastatin (LIPITOR) 20 MG  tablet Take 1 tablet (20 mg total) by mouth daily. 90 tablet 3   Calcium Carb-Cholecalciferol (CALCIUM 600 + D PO) Take 1 tablet by mouth daily.     carvedilol (COREG) 3.125 MG tablet Take 1 tablet (3.125 mg total) by mouth 2 (two) times daily with a meal. 60 tablet 3   diphenhydramine-acetaminophen (TYLENOL PM) 25-500 MG TABS Take 1 tablet by mouth at bedtime as needed (sleep).     furosemide (LASIX) 40 MG tablet TAKE 1 TABLET BY MOUTH EVERY DAY 90 tablet 3   glimepiride (AMARYL) 2 MG tablet TAKE 1 TABLET BY MOUTH EVERY DAY WITH BREAKFAST 90 tablet 1   glucose blood (ONETOUCH VERIO) test strip Check Blood Sugar 3 times daily. 100 strip 4   hyoscyamine (LEVSIN SL) 0.125 MG SL tablet Place 1 tablet (0.125 mg total) under the tongue every 6 (six) hours as needed. 30 tablet 1   insulin glargine (LANTUS SOLOSTAR) 100 UNIT/ML Solostar Pen INJECT 10 UNITS INTO THE SKIN DAILY (Patient taking differently: Inject 10 Units into the skin daily. Pt taking 12 units QHS) 15 mL 1   Insulin Pen Needle (BD PEN NEEDLE NANO 2ND GEN) 32G X 4 MM MISC USE AS DIRECTED WITH LANTUS ONCE A DAY. 100 each 3   irbesartan (AVAPRO) 75 MG tablet Take 1 tablet (75 mg total) by mouth daily. 90 tablet 3   Lancets (ONETOUCH DELICA PLUS LANCET33G) MISC USE AS DIRECTED TO TEST DAILY 100  each 12   meclizine (ANTIVERT) 12.5 MG tablet Take 1 tablet (12.5 mg total) by mouth 3 (three) times daily as needed for dizziness. As needed for   Dizziness or nausea 30 tablet 1   nystatin cream (MYCOSTATIN) Apply 1 application topically 2 (two) times daily. 30 g 1   ramelteon (ROZEREM) 8 MG tablet Take 1 tablet (8 mg total) by mouth at bedtime. 30 tablet 1   Vitamin D, Ergocalciferol, (DRISDOL) 1.25 MG (50000 UNIT) CAPS capsule Take 1 capsule (50,000 Units total) by mouth every 7 (seven) days. TAKE 1 CAPSULE (50,000 UNITS TOTAL) BY MOUTH EVERY 7 (SEVEN) DAYS 5 capsule 4   No current facility-administered medications for this visit.   Physical  exam: Blood pressure (!) 98/52, pulse 77, temperature 97.8 F (36.6 C), temperature source Oral, resp. rate 18, height 5\' 4"  (1.626 m), weight 214 lb 1.3 oz (97.1 kg), SpO2 99%.  General appearance: Comfortable appearing without any discomfort Head: Normocephalic without any trauma Oropharynx: Mucous membranes are moist and pink without any thrush or ulcers. Eyes: Pupils are equal and round reactive to light. Lymph nodes: No cervical, supraclavicular, inguinal or axillary lymphadenopathy.   Heart:regular rate and rhythm.  S1 and S2 without leg edema. Lung: Clear without any rhonchi or wheezes.  No dullness to percussion. Abdomin: Soft, nontender, nondistended with good bowel sounds.  No hepatosplenomegaly. Musculoskeletal: No joint deformity or effusion.  Full range of motion noted. Neurological: No deficits noted on motor, sensory and deep tendon reflex exam. Skin: No petechial rash or dryness.  Appeared moist.    Lab Results: Lab Results  Component Value Date   WBC 6.3 10/12/2022   HGB 12.1 10/12/2022   HCT 37.8 10/12/2022   MCV 102.3 (H) 10/12/2022   PLT 168.0 10/12/2022     Chemistry      Component Value Date/Time   NA 139 10/19/2022 1025   NA 139 08/23/2017 0933   NA 140 11/01/2016 0818   K 4.5 10/19/2022 1025   K 4.4 11/01/2016 0818   CL 105 10/19/2022 1025   CL 107 04/16/2012 0923   CO2 25 10/19/2022 1025   CO2 26 11/01/2016 0818   BUN 36 (H) 10/19/2022 1025   BUN 24 08/23/2017 0933   BUN 17.9 11/01/2016 0818   CREATININE 1.28 (H) 10/19/2022 1025   CREATININE 1.41 (H) 12/01/2021 0954   CREATININE 1.1 11/01/2016 0818   GLU 151 (H) 09/30/2019 1014      Component Value Date/Time   CALCIUM 10.4 10/19/2022 1025   CALCIUM 10.3 11/01/2016 0818   ALKPHOS 74 10/19/2022 1025   ALKPHOS 72 11/01/2016 0818   AST 34 10/19/2022 1025   AST 41 12/01/2021 0954   AST 75 (H) 11/01/2016 0818   ALT 30 10/19/2022 1025   ALT 40 12/01/2021 0954   ALT 75 (H) 11/01/2016 0818    BILITOT 0.7 10/19/2022 1025   BILITOT 1.1 12/01/2021 0954   BILITOT 0.78 11/01/2016 0818      Study Result CT CHEST ABDOMEN PELVIS WO CONTRAST CLINICAL DATA:  Renal cell carcinoma, active surveillance. * Tracking Code: BO *  EXAM: CT CHEST, ABDOMEN AND PELVIS WITHOUT CONTRAST  TECHNIQUE: Multidetector CT imaging of the chest, abdomen and pelvis was performed following the standard protocol without IV contrast.  RADIATION DOSE REDUCTION: This exam was performed according to the departmental dose-optimization program which includes automated exposure control, adjustment of the mA and/or kV according to patient size and/or use of iterative reconstruction technique.  COMPARISON:  Multiple priors including MRI December 30, 2021 and CT December 01, 2021  FINDINGS: CT CHEST FINDINGS  Cardiovascular: Aortic atherosclerosis. Coronary artery calcifications. Normal size heart. No significant pericardial effusion/thickening.  Mediastinum/Nodes: No suspicious thyroid nodule. No pathologically enlarged mediastinal, hilar or axillary lymph nodes. The esophagus is grossly unremarkable.  Lungs/Pleura: Biapical pleuroparenchymal scarring. Scattered scarring/atelectasis.  New solid 13 mm right lower lobe pulmonary nodule on image 78/5.  Musculoskeletal: No aggressive lytic or blastic lesion of bone. Multilevel degenerative change of the spine.  CT ABDOMEN PELVIS FINDINGS  Hepatobiliary: No suspicious hepatic lesion on noncontrast enhanced examination. Cirrhotic hepatic morphology. Gallbladder surgically absent. Similar prominence of the biliary tree compatible with reservoir effect post cholecystectomy.  Pancreas: No pancreatic ductal dilation or evidence of acute inflammation.  Spleen: No splenomegaly. Soft tissue nodules along the inferior and lateral margin of the spleen compatible with splenules are stable from prior examinations.  Adrenals/Urinary Tract: No discrete adrenal  nodule.  Status post left nephrectomy without new suspicious nodularity in the nephrectomy bed.  Prior right lower pole cryoablation appears similar measuring up to 18 mm on image 77/3. No noncontrast enhanced CT evidence of local recurrence.  Urinary bladder is unremarkable for degree of distension.  Stomach/Bowel: No radiopaque enteric contrast material was administered. Stomach is nondistended limiting evaluation. No pathologic dilation of small or large bowel. Colonic diverticulosis with mild wall thickening and stranding along the sigmoid colon.  Vascular/Lymphatic: Aortic atherosclerosis. Smooth IVC contours. Chronic upper abdominal varices. Prominent retroperitoneal lymph nodes are similar prior examination.  Reproductive: Status post hysterectomy. No adnexal masses.  Other: No walled off fluid collection.  No pneumoperitoneum.  Musculoskeletal: No aggressive lytic or blastic lesion of bone. Multilevel degenerative changes spine.  IMPRESSION: 1. New solid 13 mm right lower lobe pulmonary nodule, suspicious for metastatic disease. 2. Prior right lower pole cryoablation appears similar without noncontrast enhanced CT evidence of local recurrence. 3. Status post left nephrectomy without new suspicious nodularity in the nephrectomy bed. 4. Colonic diverticulosis with mild wall thickening and stranding along the sigmoid colon, suggestive of mild acute uncomplicated diverticulitis. 5. Cirrhotic hepatic morphology with sequela of portal hypertension including chronic upper abdominal varices. 6.  Aortic Atherosclerosis (ICD10-I70.0).  These results will be called to the ordering clinician or representative by the Radiologist Assistant, and communication documented in the PACS or [employer].  Electronically Signed   By: Maudry Mayhew M.D.   On: 11/29/2022 10:23  Assessment and Plan: Ms. Goldberger is a 77 y.o. female with a history of left stage IV clear cell renal  carcinoma(2001) with pulmonary involvement. Resected.  She is currently on active surveillance without any recent history of active disease. She also has a history of a right kidney tumor s/p ablation therapy. She is followed by IR for this with MRI.    Today we reviewed her recent CT Chest/Abd/Pelvis which showed the new nodule of concern. She is followed by Pulmonology with her last visit being on 11/28/2022. She will need further work up for this area. I will reach out to her thoracic surgery team to schedule follow up.   Per Dr. Alver Fisher notes, local therapy for any recurrent disease could be considered.  Systemic therapy will be deferred if she has more advanced widespread disease.  Review of a recent CMP shows stable kidney function and no evidence of anemia.    We will see her again in 1 month with Dr. Myna Hidalgo, labs (CBC w/, CMP, [facility]   Rushie Chestnut, PA-C 12/04/2022  11:57 AM

## 2022-12-06 ENCOUNTER — Ambulatory Visit: Payer: No Typology Code available for payment source | Admitting: Oncology

## 2022-12-07 ENCOUNTER — Other Ambulatory Visit: Payer: Self-pay | Admitting: Pulmonary Disease

## 2022-12-07 MED ORDER — ESZOPICLONE 1 MG PO TABS: 1.0000 mg | ORAL_TABLET | Freq: Every evening | ORAL | 0 refills | Status: DC | PRN

## 2022-12-07 NOTE — Progress Notes (Signed)
Lunesta called in in place of Rozerem

## 2022-12-07 NOTE — Telephone Encounter (Signed)
Lunesta called in in place of Rozerem

## 2022-12-08 NOTE — Telephone Encounter (Signed)
Called and spoke with patient and advised her to call her insurance company to see what is on the formulary that is comparable to Rozerem.  I saw that Dr. Wynona Neat had sent in [place] on 11/7, advised her to call the pharmacy and see what the cost will be and if it is too expensive to call her insurance company to find out what is on the formulary.  She verbalized understanding.  She has our number to call us back.

## 2022-12-27 ENCOUNTER — Other Ambulatory Visit: Payer: Self-pay | Admitting: Family Medicine

## 2023-01-01 ENCOUNTER — Inpatient Hospital Stay: Payer: No Typology Code available for payment source | Attending: Family

## 2023-01-01 ENCOUNTER — Inpatient Hospital Stay (HOSPITAL_BASED_OUTPATIENT_CLINIC_OR_DEPARTMENT_OTHER): Payer: No Typology Code available for payment source | Admitting: Hematology & Oncology

## 2023-01-01 ENCOUNTER — Encounter: Payer: Self-pay | Admitting: Hematology & Oncology

## 2023-01-01 VITALS — BP 119/49 | HR 76 | Temp 98.7°F | Resp 24 | Ht 64.0 in | Wt 213.1 lb

## 2023-01-01 DIAGNOSIS — Z79899 Other long term (current) drug therapy: Secondary | ICD-10-CM | POA: Insufficient documentation

## 2023-01-01 DIAGNOSIS — R911 Solitary pulmonary nodule: Secondary | ICD-10-CM | POA: Diagnosis not present

## 2023-01-01 DIAGNOSIS — Z905 Acquired absence of kidney: Secondary | ICD-10-CM | POA: Insufficient documentation

## 2023-01-01 DIAGNOSIS — C651 Malignant neoplasm of right renal pelvis: Secondary | ICD-10-CM

## 2023-01-01 DIAGNOSIS — C642 Malignant neoplasm of left kidney, except renal pelvis: Secondary | ICD-10-CM | POA: Insufficient documentation

## 2023-01-01 DIAGNOSIS — Z87891 Personal history of nicotine dependence: Secondary | ICD-10-CM | POA: Insufficient documentation

## 2023-01-01 DIAGNOSIS — D4989 Neoplasm of unspecified behavior of other specified sites: Secondary | ICD-10-CM

## 2023-01-01 LAB — CBC WITH DIFFERENTIAL (CANCER CENTER ONLY)
Abs Immature Granulocytes: 0.04 10*3/uL (ref 0.00–0.07)
Basophils Absolute: 0 10*3/uL (ref 0.0–0.1)
Basophils Relative: 1 %
Eosinophils Absolute: 0.2 10*3/uL (ref 0.0–0.5)
Eosinophils Relative: 2 %
HCT: 40 % (ref 36.0–46.0)
Hemoglobin: 12.9 g/dL (ref 12.0–15.0)
Immature Granulocytes: 1 %
Lymphocytes Relative: 30 %
Lymphs Abs: 2.4 10*3/uL (ref 0.7–4.0)
MCH: 32.6 pg (ref 26.0–34.0)
MCHC: 32.3 g/dL (ref 30.0–36.0)
MCV: 101 fL — ABNORMAL HIGH (ref 80.0–100.0)
Monocytes Absolute: 0.6 10*3/uL (ref 0.1–1.0)
Monocytes Relative: 8 %
Neutro Abs: 4.8 10*3/uL (ref 1.7–7.7)
Neutrophils Relative %: 58 %
Platelet Count: 157 10*3/uL (ref 150–400)
RBC: 3.96 MIL/uL (ref 3.87–5.11)
RDW: 14.6 % (ref 11.5–15.5)
WBC Count: 8.1 10*3/uL (ref 4.0–10.5)
nRBC: 0 % (ref 0.0–0.2)

## 2023-01-01 LAB — COMPREHENSIVE METABOLIC PANEL
ALT: 33 U/L (ref 0–44)
AST: 37 U/L (ref 15–41)
Albumin: 4.1 g/dL (ref 3.5–5.0)
Alkaline Phosphatase: 67 U/L (ref 38–126)
Anion gap: 9 (ref 5–15)
BUN: 41 mg/dL — ABNORMAL HIGH (ref 8–23)
CO2: 26 mmol/L (ref 22–32)
Calcium: 11.2 mg/dL — ABNORMAL HIGH (ref 8.9–10.3)
Chloride: 105 mmol/L (ref 98–111)
Creatinine, Ser: 1.51 mg/dL — ABNORMAL HIGH (ref 0.44–1.00)
GFR, Estimated: 35 mL/min — ABNORMAL LOW (ref >60)
Glucose, Bld: 186 mg/dL — ABNORMAL HIGH (ref 70–99)
Potassium: 6 mmol/L — ABNORMAL HIGH (ref 3.5–5.1)
Sodium: 140 mmol/L (ref 135–145)
Total Bilirubin: 1 mg/dL (ref <1.2)
Total Protein: 7.5 g/dL (ref 6.5–8.1)

## 2023-01-01 NOTE — Progress Notes (Signed)
Hematology and Oncology Follow Up   Amanda Crawford [id number] [birth year] 77 y.o. 01/01/2023 4:10 PM Amanda Crawford, MDBlyth, Amanda Lions, MD   Principle Diagnosis:  77 year old woman with left kidney cancer diagnosed in 2001.  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  He has not required any systemic treatment.     Secondary diagnosis: Right renal neoplasm diagnosed in July 2020.     Prior Therapy:  She is status post left nephrectomy in 2001.   She developed a left lingular mass, status post VATS procedure.  Pathology revealed a recurrent renal cell carcinoma resected in October 2008.    She developed relapse disease in 2014 and subsequently underwent a VATS and left upper lobe wedge resection of a tumor done on 05/09/2012. Pathology confirmed another relapse.  She is status post cryoablation of a right renal neoplasm on August 21, 2018.   Current therapy: Active surveillance.   Interim History: Amanda Crawford is here for a follow-up visit.  I think that the issue right now is that there may be no issue with recurrence.  She had a CT scan of the chest/abdomen/pelvis on 11/29/2022.  This seem to show that there was a new 13 mm nodule in the right lower lobe.  She I think saw Pulmonary medicine.  I am not sure they have followed up with her.  She was supposed to have a colonoscopy but was told that she had diverticulitis.  I am not sure when another colonoscopy will be done.  She does not have any problems with increased cough or shortness of breath.  She was a past smoker.  She has had no fever.  She has had no abdominal pain.  She has had no rashes.  There has been no bleeding.  She has had no leg swelling.  She has had no headache.  Overall, I would say that her performance status is probably ECOG 1.  Wt Readings from Last 3 Encounters:  01/01/23 213 lb 1.9  oz (96.7 kg)  12/04/22 214 lb 1.3 oz (97.1 kg)  11/28/22 214 lb (97.1 kg)     Medications: Reviewed without changes. Current Outpatient Medications  Medication Sig Dispense Refill   allopurinol (ZYLOPRIM) 300 MG tablet TAKE 1 TABLET BY MOUTH EVERY DAY 90 tablet 1   aspirin EC 81 MG tablet Take 81 mg by mouth daily.     atorvastatin (LIPITOR) 20 MG tablet Take 1 tablet (20 mg total) by mouth daily. 90 tablet 3   carvedilol (COREG) 3.125 MG tablet Take 1 tablet (3.125 mg total) by mouth 2 (two) times daily with a meal. 60 tablet 3   diphenhydramine-acetaminophen (TYLENOL PM) 25-500 MG TABS Take 1 tablet by mouth at bedtime as needed (sleep).     furosemide (LASIX) 40 MG tablet TAKE 1 TABLET BY MOUTH EVERY DAY 90 tablet 3   glimepiride (AMARYL) 2 MG tablet TAKE 1 TABLET BY MOUTH EVERY DAY WITH BREAKFAST 90 tablet 1   glucose blood (ONETOUCH VERIO) test strip Check Blood Sugar 3 times daily. 100 strip 4   insulin glargine (LANTUS SOLOSTAR) 100 UNIT/ML Solostar Pen Inject 12 Units into the skin daily. 15 mL 1   Insulin Pen Needle (BD PEN NEEDLE NANO 2ND GEN) 32G X 4 MM MISC USE AS DIRECTED WITH LANTUS ONCE A DAY. 100 each 3   irbesartan (AVAPRO) 75 MG tablet Take  1 tablet (75 mg total) by mouth daily. 90 tablet 3   Lancets (ONETOUCH DELICA PLUS LANCET33G) MISC USE AS DIRECTED TO TEST DAILY 100 each 12   nystatin cream (MYCOSTATIN) Apply 1 application topically 2 (two) times daily. 30 g 1   Vitamin D, Ergocalciferol, (DRISDOL) 1.25 MG (50000 UNIT) CAPS capsule Take 1 capsule (50,000 Units total) by mouth every 7 (seven) days. TAKE 1 CAPSULE (50,000 UNITS TOTAL) BY MOUTH EVERY 7 (SEVEN) DAYS 5 capsule 4   Calcium Carb-Cholecalciferol (CALCIUM 600 + D PO) Take 1 tablet by mouth daily. (Patient not taking: Reported on 01/01/2023)     eszopiclone (LUNESTA) 1 MG TABS tablet Take 1 tablet (1 mg total) by mouth at bedtime as needed for sleep. Take immediately before bedtime (Patient not taking: Reported on  01/01/2023) 30 tablet 0   hyoscyamine (LEVSIN SL) 0.125 MG SL tablet Place 1 tablet (0.125 mg total) under the tongue every 6 (six) hours as needed. (Patient not taking: Reported on 01/01/2023) 30 tablet 1   meclizine (ANTIVERT) 12.5 MG tablet Take 1 tablet (12.5 mg total) by mouth 3 (three) times daily as needed for dizziness. As needed for   Dizziness or nausea (Patient not taking: Reported on 01/01/2023) 30 tablet 1   ramelteon (ROZEREM) 8 MG tablet Take 1 tablet (8 mg total) by mouth at bedtime. (Patient not taking: Reported on 01/01/2023) 30 tablet 1   No current facility-administered medications for this visit.   Physical exam: Blood pressure (!) 119/49, pulse 76, temperature 98.7 F (37.1 C), temperature source Oral, resp. rate (!) 24, height 5\' 4"  (1.626 m), weight 213 lb 1.9 oz (96.7 kg), SpO2 94%.  General appearance: Comfortable appearing without any discomfort Head: Normocephalic without any trauma Oropharynx: Mucous membranes are moist and pink without any thrush or ulcers. Eyes: Pupils are equal and round reactive to light. Lymph nodes: No cervical, supraclavicular, inguinal or axillary lymphadenopathy.   Heart:regular rate and rhythm.  S1 and S2 without leg edema. Lung: Clear without any rhonchi or wheezes.  No dullness to percussion. Abdomin: Soft, nontender, nondistended with good bowel sounds.  No hepatosplenomegaly. Musculoskeletal: No joint deformity or effusion.  Full range of motion noted. Neurological: No deficits noted on motor, sensory and deep tendon reflex exam. Skin: No petechial rash or dryness.  Appeared moist.    Lab Results: Lab Results  Component Value Date   WBC 8.1 01/01/2023   HGB 12.9 01/01/2023   HCT 40.0 01/01/2023   MCV 101.0 (H) 01/01/2023   PLT 157 01/01/2023     Chemistry      Component Value Date/Time   NA 139 10/19/2022 1025   NA 139 08/23/2017 0933   NA 140 11/01/2016 0818   K 4.5 10/19/2022 1025   K 4.4 11/01/2016 0818   CL 105  10/19/2022 1025   CL 107 04/16/2012 0923   CO2 25 10/19/2022 1025   CO2 26 11/01/2016 0818   BUN 36 (H) 10/19/2022 1025   BUN 24 08/23/2017 0933   BUN 17.9 11/01/2016 0818   CREATININE 1.28 (H) 10/19/2022 1025   CREATININE 1.41 (H) 12/01/2021 0954   CREATININE 1.1 11/01/2016 0818   GLU 151 (H) 09/30/2019 1014      Component Value Date/Time   CALCIUM 10.4 10/19/2022 1025   CALCIUM 10.3 11/01/2016 0818   ALKPHOS 74 10/19/2022 1025   ALKPHOS 72 11/01/2016 0818   AST 34 10/19/2022 1025   AST 41 12/01/2021 0954   AST 75 (H) 11/01/2016 0818  ALT 30 10/19/2022 1025   ALT 40 12/01/2021 0954   ALT 75 (H) 11/01/2016 0818   BILITOT 0.7 10/19/2022 1025   BILITOT 1.1 12/01/2021 0954   BILITOT 0.78 11/01/2016 0818      Study Result  Assessment and Plan: Ms. Feenstra is a 77 y.o. female with a history of left stage IV clear cell renal carcinoma(2001) with pulmonary involvement.  She had the metastatic lesion resected.  She is currently on active surveillance without any recent history of active disease. She also has a history of a right kidney tumor s/p ablation therapy. She is followed by IR for this with MRI.   Again, she had this right lower lobe nodule.  Again I have to get another CT scan on her.  I think all needs a CT of the chest to see what is going on.  I do not think we need to have contrast.  I think an option might be radiosurgery.  I do not think she has had radiosurgery for any type of metastatic nodule.  I think this would be reasonable for her.  We will see about getting the CT of the chest in about 3 to 4 weeks.  I will then plan to see her back after the Holiday season and then we can decide as to how we can proceed.    Josph Macho, MD 01/01/2023 4:10 PM

## 2023-01-02 ENCOUNTER — Other Ambulatory Visit: Payer: Self-pay | Admitting: *Deleted

## 2023-01-02 ENCOUNTER — Telehealth: Payer: Self-pay | Admitting: *Deleted

## 2023-01-02 DIAGNOSIS — R918 Other nonspecific abnormal finding of lung field: Secondary | ICD-10-CM

## 2023-01-02 NOTE — Telephone Encounter (Addendum)
-----   Message from Josph Macho sent at 01/01/2023  5:26 PM EST ----- Called patient to let her know that she has to come in on Wednesday have lab work done again.  Her potassium is on the high side.  Her calcium also is a little on the high side.  We need to repeat a c-Met on Wednesday. Appt confirmed with patient

## 2023-01-03 ENCOUNTER — Inpatient Hospital Stay: Payer: No Typology Code available for payment source

## 2023-01-03 DIAGNOSIS — D4989 Neoplasm of unspecified behavior of other specified sites: Secondary | ICD-10-CM

## 2023-01-03 DIAGNOSIS — C642 Malignant neoplasm of left kidney, except renal pelvis: Secondary | ICD-10-CM | POA: Diagnosis not present

## 2023-01-03 LAB — CMP (CANCER CENTER ONLY)
ALT: 30 U/L (ref 0–44)
AST: 36 U/L (ref 15–41)
Albumin: 4 g/dL (ref 3.5–5.0)
Alkaline Phosphatase: 64 U/L (ref 38–126)
Anion gap: 7 (ref 5–15)
BUN: 37 mg/dL — ABNORMAL HIGH (ref 8–23)
CO2: 25 mmol/L (ref 22–32)
Calcium: 10.8 mg/dL — ABNORMAL HIGH (ref 8.9–10.3)
Chloride: 107 mmol/L (ref 98–111)
Creatinine: 1.31 mg/dL — ABNORMAL HIGH (ref 0.44–1.00)
GFR, Estimated: 41 mL/min — ABNORMAL LOW (ref >60)
Glucose, Bld: 228 mg/dL — ABNORMAL HIGH (ref 70–99)
Potassium: 5.4 mmol/L — ABNORMAL HIGH (ref 3.5–5.1)
Sodium: 139 mmol/L (ref 135–145)
Total Bilirubin: 0.9 mg/dL (ref <1.2)
Total Protein: 7.3 g/dL (ref 6.5–8.1)

## 2023-01-08 NOTE — Progress Notes (Unsigned)
Cardiology Clinic Note   Patient Name: Amanda Crawford Date of Encounter: 01/08/2023  Primary Care Provider:  [name], MD Primary Cardiologist:  Chrystie Nose, MD  Patient Profile     Amanda Crawford 77 year old female presents to the clinic today for follow-up evaluation of her nonischemic cardiomyopathy and hypertension.  Past Medical History    Past Medical History:  Diagnosis Date   Allergic state 03/12/2015   Arthritis    Colon polyp 11/19/2014   Dermatitis 03/12/2015   Diarrhea 06/29/2016   Diverticulitis    DM (diabetes mellitus), type 2 (HCC) 06/29/2016   GERD (gastroesophageal reflux disease)    occ   Gout 03/12/2015   H/O measles    H/O mumps    Headache(784.0)    migraines   History of chicken pox 11/23/2014   Hyperglycemia 06/29/2016   Lung cancer (HCC)    Migraine 11/23/2014   Obesity 11/23/2014   Pneumonia    child   Preventative health care 09/12/2015   Primary hypertension 12/15/2020   Renal cell cancer (HCC)    renal cell ca dx 9/01 and 8/08;   Renal insufficiency    Shortness of breath    occ   Skin lesion of breast 03/12/2015   Past Surgical History:  Procedure Laterality Date   ABDOMINAL HYSTERECTOMY     ANAL RECTAL MANOMETRY N/A 06/29/2021   Procedure: ANO RECTAL MANOMETRY;  Surgeon: Shellia Cleverly, DO;  Location: [facility];  Service: Gastroenterology;  Laterality: N/A;   APPENDECTOMY     CARDIAC CATHETERIZATION     yrs ago neg   CHOLECYSTECTOMY     COLONOSCOPY  2018   ESOPHAGOGASTRODUODENOSCOPY     years ago   IR RADIOLOGIST EVAL & MGMT  07/17/2018   IR RADIOLOGIST EVAL & MGMT  09/12/2018   IR RADIOLOGIST EVAL & MGMT  06/18/2019   IR RADIOLOGIST EVAL & MGMT  01/13/2020   IR RADIOLOGIST EVAL & MGMT  07/08/2020   IR RADIOLOGIST EVAL & MGMT  10/12/2020   KIDNEY SURGERY  2001   Removed of left kidney    LUNG CANCER SURGERY Left 08   RADIOLOGY WITH ANESTHESIA N/A 08/21/2018   Procedure: CT WITH ANESTHESIA RENAL CRYOABLATION;   Surgeon: Oley Balm, MD;  Location: [facility];  Service: Radiology;  Laterality: N/A;   RENAL MASS EXCISION Left 01   RIGHT/LEFT HEART CATH AND CORONARY ANGIOGRAPHY N/A 07/27/2016   Procedure: Right/Left Heart Cath and Coronary Angiography;  Surgeon: Marykay Lex, MD;  [facility]: [facility] INVASIVE CV LAB;  Service: Cardiovascular;  Laterality: N/A;   THORACOTOMY Left 05/09/2012   Procedure: THORACOTOMY MAJOR;  Surgeon: Alleen Borne, MD;  Location: [facility] OR;  Service: Thoracic;  Laterality: Left;   VIDEO BRONCHOSCOPY N/A 05/09/2012   Procedure: VIDEO BRONCHOSCOPY;  Surgeon: Alleen Borne, MD;  Location: [facility] OR;  Service: Thoracic;  Laterality: N/A;   WEDGE RESECTION Left 05/09/2012   Procedure: LEFT UPPER LOBE WEDGE RESECTION;  Surgeon: Alleen Borne, MD;  Location: [facility] OR;  Service: Thoracic;  Laterality: Left;    Allergies  No Known Allergies  History of Present Illness     Amanda Crawford has a PMH of nonischemic cardiomyopathy, LBBB, chronic systolic CHF, bronchitis, GERD, type 2 diabetes, osteopenia, DJD, cancer of the kidney, dyspnea, morbid obesity, diarrhea, anemia, elevated lipase, and hyperlipidemia.  She was seen in follow-up by Dr. Rennis Golden on 02/23/2022.  During that time she was stable from a cardiac standpoint.  Her blood pressure was well-controlled.  She was considering starting Silver sneakers at the [facility].  Her left bundle branch block was stable.  Follow-up labs were due to be drawn by her PCP.  Follow-up was planned for 1 year and as needed.   She was admitted to the hospital on 07/2022 at [facility] with acute respiratory failure with hypoxia.  She reported feeling fatigued and short of breath with activity.  Her vital signs are stable.  She denied nausea and vomiting.  Her chest CT showed chronic lung changes with no acute pulmonary findings.  She was negative for PE.  It was felt that her symptoms were noninfectious.  Echocardiogram was ordered and showed an LVEF of 60 to 65%,  mild diastolic dysfunction, low-normal LA pressures, mild concentric LVH and mild tricuspid valve regurgitation.  Her EKG showed normal sinus rhythm.  Blood cultures were drawn but showed no growth.  Due to her symptoms she was treated for pneumonia.  She was instructed to complete her course of antibiotics and follow-up with her PCP.  She followed up with her PCP and was noted to have abdominal pain and diarrhea after treatment with antibiotics.  She had a stool sample ordered to check for C. difficile and was referred to gastroenterology for further consultation.  She was referred back to pulmonology for further evaluation of her bronchitis versus pneumonia.  She was instructed to follow-up with cardiology for review of her nonischemic cardiomyopathy and CHF.  She presents to the clinic 09/04/22 for follow-up evaluation and stated she was feeling much better after hospitalization.  We reviewed her urgent care visit and her hospitalization.  She expressed understanding.  Her EKG  showed normal sinus rhythm with nonspecific intraventricular conduction block 83 bpm.  Her blood pressure was well-controlled.  She brings in her blood pressure log and heart rates which had also been well-controlled.  We reviewed her echocardiogram from [facility].  I continued her current medication regimen and planned follow-up in 4 months.  She presents to the clinic today for follow-up evaluation and states***.  Today she denies chest pain, increased shortness of breath, lower extremity edema, fatigue, palpitations, melena, hematuria, hemoptysis, diaphoresis.  Home Medications    Prior to Admission medications   Medication Sig Start Date End Date Taking? Authorizing Provider  allopurinol (ZYLOPRIM) 300 MG tablet Take 1 tablet (300 mg total) by mouth daily. 04/17/22   [name], MD  aspirin EC 81 MG tablet Take 81 mg by mouth daily.    [provider]  atorvastatin (LIPITOR) 20 MG tablet Take 1 tablet (20 mg  total) by mouth daily. 06/22/22   [name], MD  BD PEN NEEDLE NANO 2ND GEN 32G X 4 MM MISC USE AS DIRECTED WITH LANTUS ONCE A DAY. 05/22/22   [name], MD  Calcium Carb-Cholecalciferol (CALCIUM 600 + D PO) Take 1 tablet by mouth daily.    [provider]  carvedilol (COREG) 3.125 MG tablet Take 1 tablet (3.125 mg total) by mouth 2 (two) times daily with a meal. 04/11/22   [name], MD  cefdinir (OMNICEF) 300 MG capsule Take 1 capsule (300 mg total) by mouth 2 (two) times daily. Patient taking differently: Take 300 mg by mouth 2 (two) times daily. for 3 days., Starting Tue 08/15/2022, Until Fri 08/18/2022 07/13/22   [name], MD  diphenhydramine-acetaminophen (TYLENOL PM) 25-500 MG TABS Take 1 tablet by mouth at bedtime as needed (sleep).  [provider]  furosemide (LASIX) 40 MG tablet TAKE 1 TABLET BY MOUTH EVERY DAY 02/22/22   Hilty, Lisette Abu, MD  glimepiride (AMARYL) 2 MG tablet TAKE 1 TABLET BY MOUTH EVERY DAY WITH BREAKFAST 06/01/22   [name], MD  glucose blood (ONETOUCH VERIO) test strip Check Blood Sugar 3 times daily. 06/08/22   [name], MD  hyoscyamine (LEVSIN SL) 0.125 MG SL tablet Place 1 tablet (0.125 mg total) under the tongue every 6 (six) hours as needed. 10/14/20   [name], MD  insulin glargine (LANTUS SOLOSTAR) 100 UNIT/ML Solostar Pen INJECT 10 UNITS INTO THE SKIN DAILY Patient taking differently: Inject 10 Units into the skin daily. Pt taking 12 units QHS 06/01/22   [name], MD  irbesartan (AVAPRO) 75 MG tablet Take 1 tablet (75 mg total) by mouth daily. 04/25/22   [name], MD  Lancets [facility] DELICA PLUS LANCET33G) MISC USE AS DIRECTED TO TEST DAILY 08/11/21   [name], MD  meclizine (ANTIVERT) 12.5 MG tablet Take 1 tablet (12.5 mg total) by mouth 3 (three) times daily as needed for dizziness. As needed for   Dizziness or nausea 11/23/14   [name], MD  nystatin cream (MYCOSTATIN) Apply 1  application topically 2 (two) times daily. 10/18/20   [name], MD  vancomycin (VANCOCIN) 125 MG capsule Take 1 capsule (125 mg total) by mouth 4 (four) times daily for 10 days. 08/27/22 09/06/22  [name], MD  Vitamin D, Ergocalciferol, (DRISDOL) 1.25 MG (50000 UNIT) CAPS capsule TAKE 1 CAPSULE (50,000 UNITS TOTAL) BY MOUTH EVERY 7 (SEVEN) DAYS 11/22/20   [name], MD  glyBURIDE-metformin [facility]) 1.25-250 MG tablet Take 1 tablet by mouth 2 (two) times daily with a meal. 03/25/19 03/25/19  [name], MD    Family History    Family History  Problem Relation Age of Onset   Dementia Mother    Heart failure Father    Diabetes Father    Hyperlipidemia Father    Heart disease Father    Hypertension Father    Asthma Brother    Asthma Daughter    Alcohol abuse Maternal Aunt    Colon cancer Paternal Aunt    Cancer Paternal Grandmother    Hearing loss Paternal Grandfather    Stroke Paternal Grandfather    She indicated that her mother is deceased. She indicated that her father is deceased. She indicated that her sister is alive. She indicated that her brother is alive. She indicated that her maternal grandmother is deceased. She indicated that her maternal grandfather is deceased. She indicated that her paternal grandmother is deceased. She indicated that her paternal grandfather is deceased. She indicated that her daughter is alive. She indicated that her son is alive. She indicated that the status of her maternal aunt is unknown. She indicated that the status of her paternal aunt is unknown.  Social History    Social History   Socioeconomic History   Marital status: Married    Spouse name: Not on file   Number of children: Not on file   Years of education: Not on file   Highest education level: Not on file  Occupational History   Occupation: retired  Tobacco Use   Smoking status: Former    Current packs/day: 0.00    Average packs/day: 0.5 packs/day for 35.0  years (17.5 ttl pk-yrs)    Types: Cigarettes    Start date: 05/08/1971  Quit date: 05/08/2006    Years since quitting: 16.6   Smokeless tobacco: Never  Vaping Use   Vaping status: Never Used  Substance and Sexual Activity   Alcohol use: Yes    Comment: seldomly   Drug use: No   Sexual activity: Not Currently    Comment: lives with husband, no dietary restrictions.   Other Topics Concern   Not on file  Social History Narrative   Not on file   Social Determinants of Health   Financial Resource Strain: Low Risk  (07/05/2020)   Overall Financial Resource Strain (CARDIA)    Difficulty of Paying Living Expenses: Not hard at all  Food Insecurity: No Food Insecurity (12/04/2022)   Hunger Vital Sign    Worried About Running Out of Food in the Last Year: Never true    Ran Out of Food in the Last Year: Never true  Transportation Needs: No Transportation Needs (12/04/2022)   PRAPARE - Administrator, Civil Service (Medical): No    Lack of Transportation (Non-Medical): No  Physical Activity: Inactive (07/05/2020)   Exercise Vital Sign    Days of Exercise per Week: 0 days    Minutes of Exercise per Session: 0 min  Stress: No Stress Concern Present (08/12/2022)   Received from [facility] - Occupational Stress Questionnaire    Feeling of Stress : Not at all  Social Connections: Unknown (06/06/2021)   [facility] from [facility]   Social Network    Social Network: Not on file  Intimate Partner Violence: Not At Risk (12/04/2022)   Humiliation, Afraid, Rape, and Kick questionnaire    Fear of Current or Ex-Partner: No    Emotionally Abused: No    Physically Abused: No    Sexually Abused: No     Review of Systems    General:  No chills, fever, night sweats or weight changes.  Cardiovascular:  No chest pain, dyspnea on exertion, edema, orthopnea, palpitations, paroxysmal nocturnal dyspnea. Dermatological: No rash,  lesions/masses Respiratory: No cough, dyspnea Urologic: No hematuria, dysuria Abdominal:   No nausea, vomiting, diarrhea, bright red blood per rectum, melena, or hematemesis Neurologic:  No visual changes, wkns, changes in mental status. All other systems reviewed and are otherwise negative except as noted above.  Physical Exam    VS:  There were no vitals taken for this visit. , BMI There is no height or weight on file to calculate BMI. GEN: Well nourished, well developed, in no acute distress. HEENT: normal. Neck: Supple, no JVD, carotid bruits, or masses. Cardiac: RRR, no murmurs, rubs, or gallops. No clubbing, cyanosis, edema.  Radials/DP/PT 2+ and equal bilaterally.  Respiratory:  Respirations regular and unlabored, clear to auscultation bilaterally. GI: Soft, nontender, nondistended, BS + x 4. MS: no deformity or atrophy. Skin: warm and dry, no rash. Neuro:  Strength and sensation are intact. Psych: Normal affect.  Accessory Clinical Findings    Recent Labs: 10/12/2022: TSH 2.58 01/01/2023: Hemoglobin 12.9; Platelet Count 157 01/03/2023: ALT 30; BUN 37; Creatinine 1.31; Potassium 5.4; Sodium 139   Recent Lipid Panel    Component Value Date/Time   CHOL 175 10/12/2022 1036   CHOL 193 09/30/2019 1014   TRIG 368.0 (H) 10/12/2022 1036   TRIG 267 (H) 09/30/2019 1014   HDL 47.80 10/12/2022 1036   CHOLHDL 4 10/12/2022 1036   VLDL 73.6 (H) 10/12/2022 1036   LDLCALC 53 10/12/2022 1036   LDLDIRECT 71.0 04/11/2022 1422  No BP recorded.  {Refresh Note OR Click here to enter BP  :1}***    ECG personally reviewed by me today- EKG Interpretation Date/Time:    Ventricular Rate:    PR Interval:    QRS Duration:    QT Interval:    QTC Calculation:   R Axis:      Text Interpretation:       Echocardiogram 03/29/2020  IMPRESSIONS     1. Left ventricular ejection fraction, by estimation, is 55 to 60%. The  left ventricle has normal function. The left ventricle has no  regional  wall motion abnormalities. There is moderate concentric left ventricular  hypertrophy. Indeterminate diastolic  filling due to E-A fusion. The average left ventricular global  longitudinal strain is -19.5 %. The global longitudinal strain is normal.   2. Right ventricular systolic function is normal. The right ventricular  size is normal. Tricuspid regurgitation signal is inadequate for assessing  PA pressure.   3. The mitral valve is normal in structure. Trivial mitral valve  regurgitation. No evidence of mitral stenosis.   4. The aortic valve is tricuspid. Aortic valve regurgitation is not  visualized. Mild aortic valve sclerosis is present, with no evidence of  aortic valve stenosis.   5. The inferior vena cava is dilated in size with >50% respiratory  variability, suggesting right atrial pressure of 8 mmHg.   Comparison(s): 05/21/17 45-50%.   FINDINGS   Left Ventricle: Left ventricular ejection fraction, by estimation, is 55  to 60%. The left ventricle has normal function. The left ventricle has no  regional wall motion abnormalities. The average left ventricular global  longitudinal strain is -19.5 %.  The global longitudinal strain is normal. The left ventricular internal  cavity size was normal in size. There is moderate concentric left  ventricular hypertrophy. Indeterminate diastolic filling due to E-A  fusion. Normal left ventricular filling  pressure.   Right Ventricle: The right ventricular size is normal. No increase in  right ventricular wall thickness. Right ventricular systolic function is  normal. Tricuspid regurgitation signal is inadequate for assessing PA  pressure.   Left Atrium: Left atrial size was normal in size.   Right Atrium: Right atrial size was normal in size.   Pericardium: There is no evidence of pericardial effusion.   Mitral Valve: The mitral valve is normal in structure. There is mild  calcification of the anterior mitral valve  leaflet(s). Trivial mitral  valve regurgitation. No evidence of mitral valve stenosis.   Tricuspid Valve: The tricuspid valve is normal in structure. Tricuspid  valve regurgitation is trivial. No evidence of tricuspid stenosis.   Aortic Valve: The aortic valve is tricuspid. Aortic valve regurgitation is  not visualized. Mild aortic valve sclerosis is present, with no evidence  of aortic valve stenosis.   Pulmonic Valve: The pulmonic valve was normal in structure. Pulmonic valve  regurgitation is trivial. No evidence of pulmonic stenosis.   Aorta: The aortic root is normal in size and structure.   Venous: The inferior vena cava is dilated in size with greater than 50%  respiratory variability, suggesting right atrial pressure of 8 mmHg.   IAS/Shunts: No atrial level shunt detected by color flow Doppler.      Assessment & Plan   1.  Nonischemic cardiomyopathy-no increased activity intolerance.  Denies increased work of breathing.  E[facility] at [facility] showed an LVEF of 60 to 65%, mild diastolic dysfunction, and mild tricuspid regurgitation. Continue carvedilol, furosemide Heart healthy low-sodium diet Increase physical  activity as tolerated  Coronary artery disease-denies anginal symptoms at rest and with exertion.  With cardiac catheterization 6/18 which showed mild nonobstructive CAD Continue current medical therapy Heart healthy low-sodium diet  Hyperlipidemia-LDL 75 on 07/13/22. High-fiber diet Continue aspirin, atorvastatin  Left bundle branch block-asymptomatic.  Continues to deny episodes of lightheadedness, presyncope or syncope. Heart rate today***bpm Continue to monitor  Disposition: Follow-up with Dr. Rennis Golden or me in 9-12 months.  Thomasene Ripple. Ileen Kahre NP-C     01/08/2023, 7:29 AM [facility] [address] Office [phone] Fax [phone]    I spent 14*** minutes examining this patient, reviewing medications, and  using patient centered shared decision making involving her cardiac care.  Prior to her visit I spent greater than 20 minutes reviewing her past medical history,  medications, and prior cardiac tests.

## 2023-01-10 ENCOUNTER — Ambulatory Visit: Payer: No Typology Code available for payment source | Attending: General Practice | Admitting: General Practice

## 2023-01-10 ENCOUNTER — Ambulatory Visit: Payer: No Typology Code available for payment source

## 2023-01-10 ENCOUNTER — Encounter: Payer: Self-pay | Admitting: General Practice

## 2023-01-10 VITALS — BP 106/58 | HR 86 | Ht 64.0 in | Wt 215.0 lb

## 2023-01-10 DIAGNOSIS — I2510 Atherosclerotic heart disease of native coronary artery without angina pectoris: Secondary | ICD-10-CM | POA: Diagnosis not present

## 2023-01-10 DIAGNOSIS — I2584 Coronary atherosclerosis due to calcified coronary lesion: Secondary | ICD-10-CM | POA: Diagnosis not present

## 2023-01-10 DIAGNOSIS — E785 Hyperlipidemia, unspecified: Secondary | ICD-10-CM | POA: Diagnosis not present

## 2023-01-10 DIAGNOSIS — K7460 Unspecified cirrhosis of liver: Secondary | ICD-10-CM | POA: Diagnosis not present

## 2023-01-10 DIAGNOSIS — J439 Emphysema, unspecified: Secondary | ICD-10-CM | POA: Diagnosis not present

## 2023-01-10 DIAGNOSIS — I447 Left bundle-branch block, unspecified: Secondary | ICD-10-CM | POA: Diagnosis not present

## 2023-01-10 DIAGNOSIS — I428 Other cardiomyopathies: Secondary | ICD-10-CM | POA: Diagnosis not present

## 2023-01-10 DIAGNOSIS — Z85528 Personal history of other malignant neoplasm of kidney: Secondary | ICD-10-CM | POA: Diagnosis not present

## 2023-01-10 DIAGNOSIS — C651 Malignant neoplasm of right renal pelvis: Secondary | ICD-10-CM | POA: Diagnosis not present

## 2023-01-10 DIAGNOSIS — R911 Solitary pulmonary nodule: Secondary | ICD-10-CM

## 2023-01-10 NOTE — Patient Instructions (Addendum)
Medication Instructions:  Continue current medications *If you need a refill on your cardiac medications before your next appointment, please call your pharmacy*   Lab Work: none If you have labs (blood work) drawn today and your tests are completely normal, you will receive your results only by: MyChart Message (if you have MyChart) OR A paper copy in the mail If you have any lab test that is abnormal or we need to change your treatment, we will call you to review the results.   Testing/Procedures: none   Follow-Up: At [facility], you and your health needs are our priority.  As part of our continuing mission to provide you with exceptional heart care, we have created designated Provider Care Teams.  These Care Teams include your primary Cardiologist (physician) and Advanced Practice Providers (APPs -  Physician Assistants and Nurse Practitioners) who all work together to provide you with the care you need, when you need it.  We recommend signing up for the patient portal called "MyChart".  Sign up information is provided on this After Visit Summary.  MyChart is used to connect with patients for Virtual Visits (Telemedicine).  Patients are able to view lab/test results, encounter notes, upcoming appointments, etc.  Non-urgent messages can be sent to your provider as well.   To learn more about what you can do with MyChart, go to [email or web address].    Your next appointment:   6 month(s)  Provider:   Edd Fabian, FNP        Other Instructions:  Your Provider wants you to :  Do chair exercises once daily   And   [place]:

## 2023-01-24 DIAGNOSIS — R0683 Snoring: Secondary | ICD-10-CM

## 2023-01-24 DIAGNOSIS — G4730 Sleep apnea, unspecified: Secondary | ICD-10-CM | POA: Diagnosis not present

## 2023-01-24 DIAGNOSIS — G478 Other sleep disorders: Secondary | ICD-10-CM

## 2023-01-24 HISTORY — DX: Sleep apnea, unspecified: G47.30

## 2023-02-01 DIAGNOSIS — K7460 Unspecified cirrhosis of liver: Secondary | ICD-10-CM | POA: Diagnosis not present

## 2023-02-06 ENCOUNTER — Other Ambulatory Visit: Payer: Self-pay

## 2023-02-06 DIAGNOSIS — C651 Malignant neoplasm of right renal pelvis: Secondary | ICD-10-CM

## 2023-02-07 ENCOUNTER — Inpatient Hospital Stay: Payer: No Typology Code available for payment source | Admitting: Hematology & Oncology

## 2023-02-07 ENCOUNTER — Inpatient Hospital Stay: Payer: No Typology Code available for payment source | Attending: Hematology & Oncology

## 2023-02-07 ENCOUNTER — Encounter: Payer: Self-pay | Admitting: Hematology & Oncology

## 2023-02-07 ENCOUNTER — Encounter: Payer: Self-pay | Admitting: Pulmonary Disease

## 2023-02-07 ENCOUNTER — Ambulatory Visit (INDEPENDENT_AMBULATORY_CARE_PROVIDER_SITE_OTHER): Payer: No Typology Code available for payment source | Admitting: Acute Care

## 2023-02-07 ENCOUNTER — Encounter: Payer: Self-pay | Admitting: Acute Care

## 2023-02-07 VITALS — BP 125/75 | HR 84 | Temp 97.6°F | Ht 64.0 in | Wt 215.4 lb

## 2023-02-07 VITALS — BP 120/56 | HR 97 | Temp 98.7°F | Resp 28 | Ht 64.0 in | Wt 214.8 lb

## 2023-02-07 DIAGNOSIS — Z85118 Personal history of other malignant neoplasm of bronchus and lung: Secondary | ICD-10-CM

## 2023-02-07 DIAGNOSIS — E119 Type 2 diabetes mellitus without complications: Secondary | ICD-10-CM | POA: Diagnosis not present

## 2023-02-07 DIAGNOSIS — Z85528 Personal history of other malignant neoplasm of kidney: Secondary | ICD-10-CM | POA: Insufficient documentation

## 2023-02-07 DIAGNOSIS — Z87891 Personal history of nicotine dependence: Secondary | ICD-10-CM

## 2023-02-07 DIAGNOSIS — R911 Solitary pulmonary nodule: Secondary | ICD-10-CM | POA: Insufficient documentation

## 2023-02-07 DIAGNOSIS — G4733 Obstructive sleep apnea (adult) (pediatric): Secondary | ICD-10-CM

## 2023-02-07 DIAGNOSIS — R590 Localized enlarged lymph nodes: Secondary | ICD-10-CM | POA: Diagnosis not present

## 2023-02-07 DIAGNOSIS — Z905 Acquired absence of kidney: Secondary | ICD-10-CM | POA: Insufficient documentation

## 2023-02-07 DIAGNOSIS — C651 Malignant neoplasm of right renal pelvis: Secondary | ICD-10-CM

## 2023-02-07 DIAGNOSIS — Z79899 Other long term (current) drug therapy: Secondary | ICD-10-CM | POA: Insufficient documentation

## 2023-02-07 DIAGNOSIS — C642 Malignant neoplasm of left kidney, except renal pelvis: Secondary | ICD-10-CM | POA: Diagnosis not present

## 2023-02-07 LAB — LACTATE DEHYDROGENASE: LDH: 162 U/L (ref 98–192)

## 2023-02-07 LAB — CBC WITH DIFFERENTIAL (CANCER CENTER ONLY)
Abs Immature Granulocytes: 0.03 10*3/uL (ref 0.00–0.07)
Basophils Absolute: 0 10*3/uL (ref 0.0–0.1)
Basophils Relative: 1 %
Eosinophils Absolute: 0.2 10*3/uL (ref 0.0–0.5)
Eosinophils Relative: 3 %
HCT: 39.5 % (ref 36.0–46.0)
Hemoglobin: 13 g/dL (ref 12.0–15.0)
Immature Granulocytes: 0 %
Lymphocytes Relative: 28 %
Lymphs Abs: 2.3 10*3/uL (ref 0.7–4.0)
MCH: 33.2 pg (ref 26.0–34.0)
MCHC: 32.9 g/dL (ref 30.0–36.0)
MCV: 100.8 fL — ABNORMAL HIGH (ref 80.0–100.0)
Monocytes Absolute: 0.6 10*3/uL (ref 0.1–1.0)
Monocytes Relative: 7 %
Neutro Abs: 5.2 10*3/uL (ref 1.7–7.7)
Neutrophils Relative %: 61 %
Platelet Count: 151 10*3/uL (ref 150–400)
RBC: 3.92 MIL/uL (ref 3.87–5.11)
RDW: 14.6 % (ref 11.5–15.5)
WBC Count: 8.4 10*3/uL (ref 4.0–10.5)
nRBC: 0 % (ref 0.0–0.2)

## 2023-02-07 LAB — CMP (CANCER CENTER ONLY)
ALT: 31 U/L (ref 0–44)
AST: 32 U/L (ref 15–41)
Albumin: 4.3 g/dL (ref 3.5–5.0)
Alkaline Phosphatase: 70 U/L (ref 38–126)
Anion gap: 8 (ref 5–15)
BUN: 29 mg/dL — ABNORMAL HIGH (ref 8–23)
CO2: 30 mmol/L (ref 22–32)
Calcium: 10.8 mg/dL — ABNORMAL HIGH (ref 8.9–10.3)
Chloride: 103 mmol/L (ref 98–111)
Creatinine: 1.57 mg/dL — ABNORMAL HIGH (ref 0.44–1.00)
GFR, Estimated: 33 mL/min — ABNORMAL LOW (ref >60)
Glucose, Bld: 131 mg/dL — ABNORMAL HIGH (ref 70–99)
Potassium: 5 mmol/L (ref 3.5–5.1)
Sodium: 141 mmol/L (ref 135–145)
Total Bilirubin: 0.9 mg/dL (ref 0.0–1.2)
Total Protein: 7.2 g/dL (ref 6.5–8.1)

## 2023-02-07 NOTE — H&P (View-Only) (Signed)
History of Present Illness Amanda Crawford is a 78 y.o. female former smoker with past medical history  , - Renal cell cancer diagnosed in 2000, Recurrent renal cell carcinoma with mets to the lung, Sleep apnea And Prediabetes, she was referred to see Dr. Delton Coombes for consideration of bronchoscopy with biopsies of a new 13 mm right lower lobe lung nodule concerning for recurrent renal cell carcinoma versus new primary lung malignancy.  Synopsis The patient, with a history of renal cell carcinoma, she underwent a nephrectomy in 2001. Subsequent surveillance scans revealed a mass in the left lung in 2008, confirmed as recurrent renal cell carcinoma and resected. Another relapse occurred in 2014, necessitating a VATS and left upper lobe web resection of the tumor. In July 2020, the patient underwent cryoablation of a right renal neoplasm. A surveillance CT scan in October 2020 revealed a new 13mm nodule in the right lower lobe of the lung, necessitating a biopsy.  The patient denies an increased cough but reports shortness of breath with exertion. They have not previously undergone a bronchoscopy. The patient also reports a history of prediabetes, managed with daily medication, and is currently being evaluated for sleep apnea. They take a low-dose aspirin daily, which will be held for two days prior to the upcoming bronchoscopy. The patient denies any known allergies and has not had any issues with anesthesia in the past.  The patient's most recent CT scan in December showed an interval increase in the size of the pulmonary nodule from 1.3 to 1.8cm over two months. The patient denies any weight loss and reports a slight weight gain since their last doctor's visit.   Principle Diagnosis:  78 year old woman with left kidney cancer diagnosed in 2001.  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 He has not required any systemic  treatment.   Secondary diagnosis: Right renal neoplasm diagnosed in July 2020.     Prior Therapy:  She is status post left nephrectomy in 2001.    She developed a left lingular mass, status post VATS procedure.  Pathology revealed a recurrent renal cell carcinoma resected in October 2008.     She developed relapse disease in 2014 and subsequently underwent a VATS and left upper lobe wedge resection of a tumor done on 05/09/2012. Pathology confirmed another relapse.   She is status post cryoablation of a right renal neoplasm on August 21, 2018. She was under surveillance, and  CT scan of the chest/abdomen/pelvis on 11/29/2022 showed  a new 13 mm nodule in the right lower lobe which has increased in size to 1.8 mm on December 2024 follow up imaging.  Pt is here for evaluation for consideration of bronchoscopy with biopsies for tissue diagnosis.   02/07/2023  Consultation for consideration of robotic assisted bronchoscopy with biopsies of new 13 mm >> 1.8 mm nodule ( October 2024-12/2022) in the right lower lobe.   Pt. Has an extensive history of recurrent renal cell carcinoma ( see synopsis above). She has been under surveillance. Most recent imaging of the chest showed a new 13 mm nodule in the right lower lobe.  There is concern that this could be yet another recurrent of her renal cell carcinoma that has metastasized to her lung.  She is followed by Dr. Myna Hidalgo through the [facility] who has sent her for evaluation for bronchoscopy with biopsies for tissue sampling.  Patient states she has been doing  well from a respiratory standpoint.  She does not have any respiratory symptoms no cough no hemoptysis no weight loss.  We discussed options for biopsy and tissue sampling to determine if this is a metastatic lesion versus a new primary lung lesion.  Patient is a former smoker with a 17.5-pack-year smoking history.  She smoked for a total of 35 years.  We discussed that biopsies for  lung nodules are done via bronchoscopy with robotic assistance.  We discussed that they are done under general anesthesia in the endoscopy suite at [facility] as a day procedure.  We discussed that the risks include infection, bleeding, pneumothorax, and adverse reaction to anesthesia.  She states she has had multiple surgeries and has never had an issue with anesthesia which is reassuring. Patient gave consent to me to move forward with robotic assisted bronchoscopy with biopsies to determine the nature of this lung nodule.  We discussed that she would need someone to to come with her to the appointment, stay in the hospital while the procedure is being done, drive her home, and stay with her for 24 hours after the procedure.  She stated she had that covered with family members and that was not a problem.  The patient does take 81 mg enteric-coated aspirin daily which I have asked her to hold for 2 days prior to the procedure.  She is diabetic.  She does use insulin but she does not take any once weekly oral medications for her diabetes.  As she is in agreement with proceeding with bronchoscopy I have placed the order, the bronchoscopy has been scheduled for February 20, 2023 at 10:30 AM at [facility] With Dr. Tonia Brooms.  She understands she must hold her aspirin for 48 hours, she has follow-up with me to review the results of the biopsy and ensure she is tolerated the procedure well on February 27, 2023 at 9:15 AM.    Test Results: CT Chest 01/10/2023 Interval increase in size of pulmonary nodule within the posterior-medial right lower lobe now measuring 1.8 cm. Previously 1.3 cm. Findings are concerning for metastatic disease. 2. Interval increase in size of subcarinal lymph nodes. Concerning for nodal metastasis. 3. Coronary artery calcifications. 4. Cirrhotic morphology of the liver. 5. Aortic Atherosclerosis (ICD10-I70.0) and Emphysema (ICD10-J43.9).     Latest Ref Rng &  Units 01/01/2023    3:24 PM 10/12/2022   10:36 AM 09/06/2022    9:10 AM  CBC  WBC 4.0 - 10.5 K/uL 8.1  6.3  6.0   Hemoglobin 12.0 - 15.0 g/dL 47.4  25.9  56.3   Hematocrit 36.0 - 46.0 % 40.0  37.8  35.6   Platelets 150 - 400 K/uL 157  168.0  177.0        Latest Ref Rng & Units 01/03/2023   10:44 AM 01/01/2023    3:24 PM 10/19/2022   10:25 AM  BMP  Glucose 70 - 99 mg/dL 875  643  329   BUN 8 - 23 mg/dL 37  41  36   Creatinine 0.44 - 1.00 mg/dL 5.18  8.41  6.60   Sodium 135 - 145 mmol/L 139  140  139   Potassium 3.5 - 5.1 mmol/L 5.4  6.0  4.5   Chloride 98 - 111 mmol/L 107  105  105   CO2 22 - 32 mmol/L 25  26  25    Calcium 8.9 - 10.3 mg/dL 63.0  16.0  10.9     BNP  Component Value Date/Time   BNP 43.8 07/24/2016 1550    ProBNP    Component Value Date/Time   PROBNP <30.0 01/30/2007 1710    PFT    Component Value Date/Time   FEV1PRE 1.73 01/20/2015 1249   FEV1POST 1.80 01/20/2015 1249   FVCPRE 2.31 01/20/2015 1249   FVCPOST 2.34 01/20/2015 1249   TLC 3.86 01/20/2015 1249   DLCOUNC 14.33 01/20/2015 1249   PREFEV1FVCRT 75 01/20/2015 1249   PSTFEV1FVCRT 77 01/20/2015 1249    CT Chest Wo Contrast Result Date: 01/21/2023 CLINICAL DATA:  History of renal cell carcinoma, active surveillance. * Tracking Code: BO * EXAM: CT CHEST WITHOUT CONTRAST TECHNIQUE: Multidetector CT imaging of the chest was performed following the standard protocol without IV contrast. RADIATION DOSE REDUCTION: This exam was performed according to the departmental dose-optimization program which includes automated exposure control, adjustment of the mA and/or kV according to patient size and/or use of iterative reconstruction technique. COMPARISON:  11/29/2022 FINDINGS: Cardiovascular: The heart size appears within normal limits. No pericardial effusion identified. Aortic atherosclerosis and coronary artery calcifications. Mediastinum/Nodes: Thyroid gland, trachea, and esophagus appear normal. Subcarinal  lymph node measures 1.2 cm, image 63/2. Previously 0.4 cm. Adjacent node measures 1.1 cm, image 70/2. Formally 0.6 cm. Sub optimal visualization of the hilar nodes secondary to lack of IV contrast. Lungs/Pleura: Postsurgical changes identified with suture chain in the medial left upper lobe. Pulmonary nodule within the posterior-medial right lower lobe measures 1.8 cm, image 85/3. Previously 1.3 cm. Posterior right lung base nodule is unchanged measuring 2 mm, image 103/3. Emphysema. Scarring in the right lower lobe. No pleural effusion, airspace consolidation or pneumothorax. Upper Abdomen: No acute abnormality. Status post left nephrectomy. Cirrhotic morphology of the liver. Musculoskeletal: No acute or suspicious osseous findings. IMPRESSION: 1. Interval increase in size of pulmonary nodule within the posterior-medial right lower lobe now measuring 1.8 cm. Previously 1.3 cm. Findings are concerning for metastatic disease. 2. Interval increase in size of subcarinal lymph nodes. Concerning for nodal metastasis. 3. Coronary artery calcifications. 4. Cirrhotic morphology of the liver. 5. Aortic Atherosclerosis (ICD10-I70.0) and Emphysema (ICD10-J43.9). Electronically Signed   By: Signa Kell M.D.   On: 01/21/2023 06:50     Past medical hx Past Medical History:  Diagnosis Date   Allergic state 03/12/2015   Arthritis    Colon polyp 11/19/2014   Dermatitis 03/12/2015   Diarrhea 06/29/2016   Diverticulitis    DM (diabetes mellitus), type 2 (HCC) 06/29/2016   GERD (gastroesophageal reflux disease)    occ   Gout 03/12/2015   H/O measles    H/O mumps    Headache(784.0)    migraines   History of chicken pox 11/23/2014   Hyperglycemia 06/29/2016   Lung cancer (HCC)    Migraine 11/23/2014   Obesity 11/23/2014   Pneumonia    child   Preventative health care 09/12/2015   Primary hypertension 12/15/2020   Renal cell cancer (HCC)    renal cell ca dx 9/01 and 8/08;   Renal insufficiency    Shortness of  breath    occ   Skin lesion of breast 03/12/2015     Social History   Tobacco Use   Smoking status: Former    Current packs/day: 0.00    Average packs/day: 0.5 packs/day for 35.0 years (17.5 ttl pk-yrs)    Types: Cigarettes    Start date: 05/08/1971    Quit date: 05/08/2006    Years since quitting: 16.7   Smokeless tobacco: Never  Vaping Use   Vaping status: Never Used  Substance Use Topics   Alcohol use: Yes    Comment: seldomly   Drug use: No    Ms.Zieger reports that she quit smoking about 16 years ago. Her smoking use included cigarettes. She started smoking about 51 years ago. She has a 17.5 pack-year smoking history. She has never used smokeless tobacco. She reports current alcohol use. She reports that she does not use drugs.  Tobacco Cessation: Patient is a former smoker, quit in 2008 with a 17.5-pack-year smoking history   Past surgical hx, Family hx, Social hx all reviewed.  Current Outpatient Medications on File Prior to Visit  Medication Sig   allopurinol (ZYLOPRIM) 300 MG tablet TAKE 1 TABLET BY MOUTH EVERY DAY   aspirin EC 81 MG tablet Take 81 mg by mouth daily.   atorvastatin (LIPITOR) 20 MG tablet Take 1 tablet (20 mg total) by mouth daily.   Calcium Carb-Cholecalciferol (CALCIUM 600 + D PO) Take 1 tablet by mouth daily.   carvedilol (COREG) 3.125 MG tablet Take 1 tablet (3.125 mg total) by mouth 2 (two) times daily with a meal.   diphenhydramine-acetaminophen (TYLENOL PM) 25-500 MG TABS Take 1 tablet by mouth at bedtime as needed (sleep).   eszopiclone (LUNESTA) 1 MG TABS tablet Take 1 tablet (1 mg total) by mouth at bedtime as needed for sleep. Take immediately before bedtime   furosemide (LASIX) 40 MG tablet TAKE 1 TABLET BY MOUTH EVERY DAY   glimepiride (AMARYL) 2 MG tablet TAKE 1 TABLET BY MOUTH EVERY DAY WITH BREAKFAST   glucose blood (ONETOUCH VERIO) test strip Check Blood Sugar 3 times daily.   hyoscyamine (LEVSIN SL) 0.125 MG SL tablet Place 1 tablet  (0.125 mg total) under the tongue every 6 (six) hours as needed.   insulin glargine (LANTUS SOLOSTAR) 100 UNIT/ML Solostar Pen Inject 12 Units into the skin daily.   Insulin Pen Needle (BD PEN NEEDLE NANO 2ND GEN) 32G X 4 MM MISC USE AS DIRECTED WITH LANTUS ONCE A DAY.   irbesartan (AVAPRO) 75 MG tablet Take 1 tablet (75 mg total) by mouth daily.   Lancets (ONETOUCH DELICA PLUS LANCET33G) MISC USE AS DIRECTED TO TEST DAILY   meclizine (ANTIVERT) 12.5 MG tablet Take 1 tablet (12.5 mg total) by mouth 3 (three) times daily as needed for dizziness. As needed for   Dizziness or nausea   nystatin cream (MYCOSTATIN) Apply 1 application topically 2 (two) times daily.   ramelteon (ROZEREM) 8 MG tablet Take 1 tablet (8 mg total) by mouth at bedtime.   Vitamin D, Ergocalciferol, (DRISDOL) 1.25 MG (50000 UNIT) CAPS capsule Take 1 capsule (50,000 Units total) by mouth every 7 (seven) days. TAKE 1 CAPSULE (50,000 UNITS TOTAL) BY MOUTH EVERY 7 (SEVEN) DAYS   [DISCONTINUED] glyBURIDE-metformin (GLUCOVANCE) 1.25-250 MG tablet Take 1 tablet by mouth 2 (two) times daily with a meal.   No current facility-administered medications on file prior to visit.     No Known Allergies  Review Of Systems:  Constitutional:   No  weight loss, night sweats,  Fevers, chills, fatigue, or  lassitude.  HEENT:   No headaches,  Difficulty swallowing,  Tooth/dental problems, or  Sore throat,                No sneezing, itching, ear ache, nasal congestion, post nasal drip,   CV:  No chest pain,  Orthopnea, PND, swelling in lower extremities, anasarca, dizziness, palpitations, syncope.   GI  No heartburn, indigestion, abdominal pain, nausea, vomiting, diarrhea, change in bowel habits, loss of appetite, bloody stools.   Resp: No shortness of breath with exertion or at rest.  No excess mucus, no productive cough,  No non-productive cough,  No coughing up of blood.  No change in color of mucus.  No wheezing.  No chest wall  deformity  Skin: no rash or lesions.  GU: no dysuria, change in color of urine, no urgency or frequency.  No flank pain, no hematuria   MS:  No joint pain or swelling.  No decreased range of motion.  No back pain.  Psych:  No change in mood or affect. No depression or anxiety.  No memory loss.   Vital Signs BP 125/75 (BP Location: Left Arm, Patient Position: Sitting, Cuff Size: Normal)   Pulse 84   Temp 97.6 F (36.4 C) (Oral)   Ht 5\' 4"  (1.626 m)   Wt 215 lb 6.4 oz (97.7 kg)   SpO2 94%   BMI 36.97 kg/m    Physical Exam:  General- No distress,  A&O x 3, pleasant and appropriate ENT: No sinus tenderness, TM clear, pale nasal mucosa, no oral exudate,no post nasal drip, no LAN Cardiac: S1, S2, regular rate and rhythm, no murmur Chest: No wheeze/ rales/ dullness; no accessory muscle use, no nasal flaring, no sternal retractions, diminished per bases Abd.: Soft Non-tender, nondistended, bowel sounds positive,Body mass index is 36.97 kg/m.  Ext: No clubbing cyanosis, edema, no obvious deformities Neuro:  normal strength, moving all extremities x 4, alert and oriented x 3, Skin: No rashes, warm and dry, no obvious lesions Psych: normal mood and behavior   Assessment/Plan  Recurrent Renal Cell Carcinoma History of nephrectomy in 2001, lung resection in 2008 and 2014, and cryoablation of right renal neoplasm in 2020.  Recent surveillance CT scan in October 2024 showed a new 13mm nodule in the right lower lobe of the lung, which has increased in size to 1.8cm as of December 2024. Enlarging subcarinal nodes concerning for nodal metastasis Plan -Schedule bronchoscopy for biopsy of right lower lobe nodule to determine if it is recurrent renal cell carcinoma or another pathology. -Stop aspirin 2 days prior to procedure to minimize bleeding risk.  Shortness of Breath Reports difficulty breathing with exertion only that she feels is related to physical deconditioning. Plan -Consider  further evaluation post-biopsy depending on results.  Sleep Apnea Undergoing evaluation for sleep apnea. -Follow up with Dr. Wynona Neat for results and management plan as is scheduled.  Prediabetes Managed with daily medication. Plan -Continue current management.  General Health Maintenance -Consider additional CT scan for mapping prior to bronchoscopy, pending discussion with Dr. Sammie Bench. -Follow up post-bronchoscopy to discuss biopsy results and next steps.  I spent 45 minutes dedicated to the care of this patient on the date of this encounter to include pre-visit review of records, face-to-face time with the patient discussing conditions above, post visit ordering of testing, clinical documentation with the electronic health record, making appropriate referrals as documented, and communicating necessary information to the patient's healthcare team.    Bevelyn Ngo, NP 02/07/2023  9:08 AM

## 2023-02-07 NOTE — Assessment & Plan Note (Signed)
No recent exacerbation, no changes 

## 2023-02-07 NOTE — Assessment & Plan Note (Signed)
 Hydrate and monitor

## 2023-02-07 NOTE — Progress Notes (Signed)
 Hematology and Oncology Follow Up   Amanda Crawford [id number] 1945/03/08 77 y.o. 02/07/2023 3:58 PM Domenica Harlene LABOR, MDBlyth, Harlene LABOR, MD   Principle Diagnosis:  78 year old woman with left kidney cancer diagnosed in 2001.  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  He has not required any systemic treatment.     Secondary diagnosis: Right renal neoplasm diagnosed in July 2020.     Prior Therapy:  She is status post left nephrectomy in 2001.   She developed a left lingular mass, status post VATS procedure.  Pathology revealed a recurrent renal cell carcinoma resected in October 2008.    She developed relapse disease in 2014 and subsequently underwent a VATS and left upper lobe wedge resection of a tumor done on 05/09/2012. Pathology confirmed another relapse.  She is status post cryoablation of a right renal neoplasm on August 21, 2018.   Current therapy: Active surveillance.   Interim History: Amanda Crawford is here for a follow-up visit.  Unfortunately, she is yet to have her bronchoscopy.  She has seen Pulmonary Medicine.  They will do a bronchoscopy on 02/20/2023.  She is having no cough.  There is no hoarseness.  She is having no fever.  There is no sweats.  She is having no nausea or vomiting.  She is having no change in bowel or bladder habits.  We did do a CT scan of the chest.  This was done on 01/10/2023.  This did show some increase in mediastinal adenopathy.  She had a subcarinal lymph node measuring 1.2 cm.  There was an adjacent lymph node measuring 1.1 cm.  There is a right lower lobe pulmonary nodule measuring 1.8 cm.    Again, we have to worry that she has metastasis to the lungs from her renal cell carcinoma.  She is supposed to have a colonoscopy.  However, this has been delayed.  Currently, I would have to say that her performance status is  probably ECOG 1.   Wt Readings from Last 3 Encounters:  02/07/23 214 lb 12.8 oz (97.4 kg)  02/07/23 215 lb 6.4 oz (97.7 kg)  01/10/23 215 lb (97.5 kg)     Medications: Reviewed without changes. Current Outpatient Medications  Medication Sig Dispense Refill   allopurinol  (ZYLOPRIM ) 300 MG tablet TAKE 1 TABLET BY MOUTH EVERY DAY 90 tablet 1   aspirin  EC 81 MG tablet Take 81 mg by mouth daily.     atorvastatin  (LIPITOR) 20 MG tablet Take 1 tablet (20 mg total) by mouth daily. 90 tablet 3   Calcium  Carb-Cholecalciferol (CALCIUM  600 + D PO) Take 1 tablet by mouth daily.     carvedilol  (COREG ) 3.125 MG tablet Take 1 tablet (3.125 mg total) by mouth 2 (two) times daily with a meal. 60 tablet 3   diphenhydramine -acetaminophen  (TYLENOL  PM) 25-500 MG TABS Take 1 tablet by mouth at bedtime as needed (sleep).     furosemide  (LASIX ) 40 MG tablet TAKE 1 TABLET BY MOUTH EVERY DAY 90 tablet 3   glimepiride  (AMARYL ) 2 MG tablet TAKE 1 TABLET BY MOUTH EVERY DAY WITH BREAKFAST 90 tablet 1   glucose blood (ONETOUCH VERIO) test strip Check Blood Sugar 3 times daily. 100 strip 4   insulin  glargine (LANTUS  SOLOSTAR) 100 UNIT/ML Solostar Pen Inject 12 Units into the skin daily. 15 mL 1   Insulin  Pen Needle (BD PEN NEEDLE  NANO 2ND GEN) 32G X 4 MM MISC USE AS DIRECTED WITH LANTUS  ONCE A DAY. 100 each 3   irbesartan  (AVAPRO ) 75 MG tablet Take 1 tablet (75 mg total) by mouth daily. 90 tablet 3   Lancets (ONETOUCH DELICA PLUS LANCET33G) MISC USE AS DIRECTED TO TEST DAILY 100 each 12   nystatin  cream (MYCOSTATIN ) Apply 1 application topically 2 (two) times daily. 30 g 1   Vitamin D , Ergocalciferol , (DRISDOL ) 1.25 MG (50000 UNIT) CAPS capsule Take 1 capsule (50,000 Units total) by mouth every 7 (seven) days. TAKE 1 CAPSULE (50,000 UNITS TOTAL) BY MOUTH EVERY 7 (SEVEN) DAYS 5 capsule 4   eszopiclone  (LUNESTA ) 1 MG TABS tablet Take 1 tablet (1 mg total) by mouth at bedtime as needed for sleep. Take immediately before  bedtime (Patient not taking: Reported on 02/07/2023) 30 tablet 0   hyoscyamine  (LEVSIN  SL) 0.125 MG SL tablet Place 1 tablet (0.125 mg total) under the tongue every 6 (six) hours as needed. (Patient not taking: Reported on 02/07/2023) 30 tablet 1   meclizine  (ANTIVERT ) 12.5 MG tablet Take 1 tablet (12.5 mg total) by mouth 3 (three) times daily as needed for dizziness. As needed for   Dizziness or nausea (Patient not taking: Reported on 02/07/2023) 30 tablet 1   ramelteon  (ROZEREM ) 8 MG tablet Take 1 tablet (8 mg total) by mouth at bedtime. (Patient not taking: Reported on 02/07/2023) 30 tablet 1   No current facility-administered medications for this visit.   Physical exam: Blood pressure (!) 120/56, pulse 97, temperature 98.7 F (37.1 C), temperature source Oral, resp. rate (!) 28, height 5' 4 (1.626 m), weight 214 lb 12.8 oz (97.4 kg), SpO2 94%.  General appearance: Comfortable appearing without any discomfort Head: Normocephalic without any trauma Oropharynx: Mucous membranes are moist and pink without any thrush or ulcers. Eyes: Pupils are equal and round reactive to light. Lymph nodes: No cervical, supraclavicular, inguinal or axillary lymphadenopathy.   Heart:regular rate and rhythm.  S1 and S2 without leg edema. Lung: Clear without any rhonchi or wheezes.  No dullness to percussion. Abdomin: Soft, nontender, nondistended with good bowel sounds.  No hepatosplenomegaly. Musculoskeletal: No joint deformity or effusion.  Full range of motion noted. Neurological: No deficits noted on motor, sensory and deep tendon reflex exam. Skin: No petechial rash or dryness.  Appeared moist.    Lab Results: Lab Results  Component Value Date   WBC 8.4 02/07/2023   HGB 13.0 02/07/2023   HCT 39.5 02/07/2023   MCV 100.8 (H) 02/07/2023   PLT 151 02/07/2023     Chemistry      Component Value Date/Time   NA 141 02/07/2023 1451   NA 139 08/23/2017 0933   NA 140 11/01/2016 0818   K 5.0 02/07/2023 1451    K 4.4 11/01/2016 0818   CL 103 02/07/2023 1451   CL 107 04/16/2012 0923   CO2 30 02/07/2023 1451   CO2 26 11/01/2016 0818   BUN 29 (H) 02/07/2023 1451   BUN 24 08/23/2017 0933   BUN 17.9 11/01/2016 0818   CREATININE 1.57 (H) 02/07/2023 1451   CREATININE 1.1 11/01/2016 0818   GLU 151 (H) 09/30/2019 1014      Component Value Date/Time   CALCIUM  10.8 (H) 02/07/2023 1451   CALCIUM  10.3 11/01/2016 0818   ALKPHOS 70 02/07/2023 1451   ALKPHOS 72 11/01/2016 0818   AST 32 02/07/2023 1451   AST 75 (H) 11/01/2016 0818   ALT 31 02/07/2023 1451  ALT 75 (H) 11/01/2016 0818   BILITOT 0.9 02/07/2023 1451   BILITOT 0.78 11/01/2016 0818        Assessment and Plan: Amanda Crawford is a 78 y.o. female with a history of left stage IV clear cell renal carcinoma(2001) with pulmonary involvement.  She had the metastatic lesion resected.  She is currently on active surveillance without any recent history of active disease. She also has a history of a right kidney tumor s/p ablation therapy. She is followed by IR for this with MRI.   Again, she had this right lower lobe nodule.  The bronchoscopy will be critical.  I know she has had a past use of tobacco use.  As such, this is going to be very helpful with respect to deciding what this nodule or adenopathy might be.  We will plan to get her back about 2 or 3 weeks after she has the bronchoscopy.  By then, we should have biopsy results back and hopefully molecular markers.    Amanda JONELLE Crease, MD 02/07/2023 3:58 PM

## 2023-02-07 NOTE — Assessment & Plan Note (Signed)
 hgba1c acceptable, minimize simple carbs. Increase exercise as tolerated. Continue current meds

## 2023-02-07 NOTE — Patient Instructions (Addendum)
 It is good to see you. We will schedule you for a biopsy. You will get a letter with the details before you leave the office today. We have discussed the risks of the procedure, and you have agreed to proceed. You will see me the week after to review the results of the scan, and to make sure you have done well after the procedure. Call if you need us  sooner.  Please contact office for sooner follow up if symptoms do not improve or worsen or seek emergency care

## 2023-02-07 NOTE — Assessment & Plan Note (Signed)
 Encourage heart healthy diet such as MIND or DASH diet, increase exercise, avoid trans fats, simple carbohydrates and processed foods, consider a krill or fish or flaxseed oil cap daily.

## 2023-02-07 NOTE — Assessment & Plan Note (Signed)
 Supplement and monitor

## 2023-02-07 NOTE — Progress Notes (Signed)
 History of Present Illness Amanda Crawford is a 78 y.o. female former smoker with past medical history  , - Renal cell cancer diagnosed in 2000, Recurrent renal cell carcinoma with mets to the lung, Sleep apnea And Prediabetes, she was referred to see Dr. Delton Coombes for consideration of bronchoscopy with biopsies of a new 13 mm right lower lobe lung nodule concerning for recurrent renal cell carcinoma versus new primary lung malignancy.  Synopsis The patient, with a history of renal cell carcinoma, she underwent a nephrectomy in 2001. Subsequent surveillance scans revealed a mass in the left lung in 2008, confirmed as recurrent renal cell carcinoma and resected. Another relapse occurred in 2014, necessitating a VATS and left upper lobe web resection of the tumor. In July 2020, the patient underwent cryoablation of a right renal neoplasm. A surveillance CT scan in October 2020 revealed a new 13mm nodule in the right lower lobe of the lung, necessitating a biopsy.  The patient denies an increased cough but reports shortness of breath with exertion. They have not previously undergone a bronchoscopy. The patient also reports a history of prediabetes, managed with daily medication, and is currently being evaluated for sleep apnea. They take a low-dose aspirin daily, which will be held for two days prior to the upcoming bronchoscopy. The patient denies any known allergies and has not had any issues with anesthesia in the past.  The patient's most recent CT scan in December showed an interval increase in the size of the pulmonary nodule from 1.3 to 1.8cm over two months. The patient denies any weight loss and reports a slight weight gain since their last doctor's visit.   Principle Diagnosis:  78 year old woman with left kidney cancer diagnosed in 2001.  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 He has not required any systemic  treatment.   Secondary diagnosis: Right renal neoplasm diagnosed in July 2020.     Prior Therapy:  She is status post left nephrectomy in 2001.    She developed a left lingular mass, status post VATS procedure.  Pathology revealed a recurrent renal cell carcinoma resected in October 2008.     She developed relapse disease in 2014 and subsequently underwent a VATS and left upper lobe wedge resection of a tumor done on 05/09/2012. Pathology confirmed another relapse.   She is status post cryoablation of a right renal neoplasm on August 21, 2018. She was under surveillance, and  CT scan of the chest/abdomen/pelvis on 11/29/2022 showed  a new 13 mm nodule in the right lower lobe which has increased in size to 1.8 mm on December 2024 follow up imaging.  Pt is here for evaluation for consideration of bronchoscopy with biopsies for tissue diagnosis.   02/07/2023  Consultation for consideration of robotic assisted bronchoscopy with biopsies of new 13 mm >> 1.8 mm nodule ( October 2024-12/2022) in the right lower lobe.   Pt. Has an extensive history of recurrent renal cell carcinoma ( see synopsis above). She has been under surveillance. Most recent imaging of the chest showed a new 13 mm nodule in the right lower lobe.  There is concern that this could be yet another recurrent of her renal cell carcinoma that has metastasized to her lung.  She is followed by Dr. Myna Hidalgo through the [facility] who has sent her for evaluation for bronchoscopy with biopsies for tissue sampling.  Patient states she has been doing  well from a respiratory standpoint.  She does not have any respiratory symptoms no cough no hemoptysis no weight loss.  We discussed options for biopsy and tissue sampling to determine if this is a metastatic lesion versus a new primary lung lesion.  Patient is a former smoker with a 17.5-pack-year smoking history.  She smoked for a total of 35 years.  We discussed that biopsies for  lung nodules are done via bronchoscopy with robotic assistance.  We discussed that they are done under general anesthesia in the endoscopy suite at [facility] as a day procedure.  We discussed that the risks include infection, bleeding, pneumothorax, and adverse reaction to anesthesia.  She states she has had multiple surgeries and has never had an issue with anesthesia which is reassuring. Patient gave consent to me to move forward with robotic assisted bronchoscopy with biopsies to determine the nature of this lung nodule.  We discussed that she would need someone to to come with her to the appointment, stay in the hospital while the procedure is being done, drive her home, and stay with her for 24 hours after the procedure.  She stated she had that covered with family members and that was not a problem.  The patient does take 81 mg enteric-coated aspirin daily which I have asked her to hold for 2 days prior to the procedure.  She is diabetic.  She does use insulin but she does not take any once weekly oral medications for her diabetes.  As she is in agreement with proceeding with bronchoscopy I have placed the order, the bronchoscopy has been scheduled for February 20, 2023 at 10:30 AM at [facility] With Dr. Tonia Brooms.  She understands she must hold her aspirin for 48 hours, she has follow-up with me to review the results of the biopsy and ensure she is tolerated the procedure well on February 27, 2023 at 9:15 AM.    Test Results: CT Chest 01/10/2023 Interval increase in size of pulmonary nodule within the posterior-medial right lower lobe now measuring 1.8 cm. Previously 1.3 cm. Findings are concerning for metastatic disease. 2. Interval increase in size of subcarinal lymph nodes. Concerning for nodal metastasis. 3. Coronary artery calcifications. 4. Cirrhotic morphology of the liver. 5. Aortic Atherosclerosis (ICD10-I70.0) and Emphysema (ICD10-J43.9).     Latest Ref Rng &  Units 01/01/2023    3:24 PM 10/12/2022   10:36 AM 09/06/2022    9:10 AM  CBC  WBC 4.0 - 10.5 K/uL 8.1  6.3  6.0   Hemoglobin 12.0 - 15.0 g/dL 47.4  25.9  56.3   Hematocrit 36.0 - 46.0 % 40.0  37.8  35.6   Platelets 150 - 400 K/uL 157  168.0  177.0        Latest Ref Rng & Units 01/03/2023   10:44 AM 01/01/2023    3:24 PM 10/19/2022   10:25 AM  BMP  Glucose 70 - 99 mg/dL 875  643  329   BUN 8 - 23 mg/dL 37  41  36   Creatinine 0.44 - 1.00 mg/dL 5.18  8.41  6.60   Sodium 135 - 145 mmol/L 139  140  139   Potassium 3.5 - 5.1 mmol/L 5.4  6.0  4.5   Chloride 98 - 111 mmol/L 107  105  105   CO2 22 - 32 mmol/L 25  26  25    Calcium 8.9 - 10.3 mg/dL 63.0  16.0  10.9     BNP  Component Value Date/Time   BNP 43.8 07/24/2016 1550    ProBNP    Component Value Date/Time   PROBNP <30.0 01/30/2007 1710    PFT    Component Value Date/Time   FEV1PRE 1.73 01/20/2015 1249   FEV1POST 1.80 01/20/2015 1249   FVCPRE 2.31 01/20/2015 1249   FVCPOST 2.34 01/20/2015 1249   TLC 3.86 01/20/2015 1249   DLCOUNC 14.33 01/20/2015 1249   PREFEV1FVCRT 75 01/20/2015 1249   PSTFEV1FVCRT 77 01/20/2015 1249    CT Chest Wo Contrast Result Date: 01/21/2023 CLINICAL DATA:  History of renal cell carcinoma, active surveillance. * Tracking Code: BO * EXAM: CT CHEST WITHOUT CONTRAST TECHNIQUE: Multidetector CT imaging of the chest was performed following the standard protocol without IV contrast. RADIATION DOSE REDUCTION: This exam was performed according to the departmental dose-optimization program which includes automated exposure control, adjustment of the mA and/or kV according to patient size and/or use of iterative reconstruction technique. COMPARISON:  11/29/2022 FINDINGS: Cardiovascular: The heart size appears within normal limits. No pericardial effusion identified. Aortic atherosclerosis and coronary artery calcifications. Mediastinum/Nodes: Thyroid gland, trachea, and esophagus appear normal. Subcarinal  lymph node measures 1.2 cm, image 63/2. Previously 0.4 cm. Adjacent node measures 1.1 cm, image 70/2. Formally 0.6 cm. Sub optimal visualization of the hilar nodes secondary to lack of IV contrast. Lungs/Pleura: Postsurgical changes identified with suture chain in the medial left upper lobe. Pulmonary nodule within the posterior-medial right lower lobe measures 1.8 cm, image 85/3. Previously 1.3 cm. Posterior right lung base nodule is unchanged measuring 2 mm, image 103/3. Emphysema. Scarring in the right lower lobe. No pleural effusion, airspace consolidation or pneumothorax. Upper Abdomen: No acute abnormality. Status post left nephrectomy. Cirrhotic morphology of the liver. Musculoskeletal: No acute or suspicious osseous findings. IMPRESSION: 1. Interval increase in size of pulmonary nodule within the posterior-medial right lower lobe now measuring 1.8 cm. Previously 1.3 cm. Findings are concerning for metastatic disease. 2. Interval increase in size of subcarinal lymph nodes. Concerning for nodal metastasis. 3. Coronary artery calcifications. 4. Cirrhotic morphology of the liver. 5. Aortic Atherosclerosis (ICD10-I70.0) and Emphysema (ICD10-J43.9). Electronically Signed   By: Signa Kell M.D.   On: 01/21/2023 06:50     Past medical hx Past Medical History:  Diagnosis Date   Allergic state 03/12/2015   Arthritis    Colon polyp 11/19/2014   Dermatitis 03/12/2015   Diarrhea 06/29/2016   Diverticulitis    DM (diabetes mellitus), type 2 (HCC) 06/29/2016   GERD (gastroesophageal reflux disease)    occ   Gout 03/12/2015   H/O measles    H/O mumps    Headache(784.0)    migraines   History of chicken pox 11/23/2014   Hyperglycemia 06/29/2016   Lung cancer (HCC)    Migraine 11/23/2014   Obesity 11/23/2014   Pneumonia    child   Preventative health care 09/12/2015   Primary hypertension 12/15/2020   Renal cell cancer (HCC)    renal cell ca dx 9/01 and 8/08;   Renal insufficiency    Shortness of  breath    occ   Skin lesion of breast 03/12/2015     Social History   Tobacco Use   Smoking status: Former    Current packs/day: 0.00    Average packs/day: 0.5 packs/day for 35.0 years (17.5 ttl pk-yrs)    Types: Cigarettes    Start date: 05/08/1971    Quit date: 05/08/2006    Years since quitting: 16.7   Smokeless tobacco: Never  Vaping Use   Vaping status: Never Used  Substance Use Topics   Alcohol use: Yes    Comment: seldomly   Drug use: No    Ms.Zieger reports that she quit smoking about 16 years ago. Her smoking use included cigarettes. She started smoking about 51 years ago. She has a 17.5 pack-year smoking history. She has never used smokeless tobacco. She reports current alcohol use. She reports that she does not use drugs.  Tobacco Cessation: Patient is a former smoker, quit in 2008 with a 17.5-pack-year smoking history   Past surgical hx, Family hx, Social hx all reviewed.  Current Outpatient Medications on File Prior to Visit  Medication Sig   allopurinol (ZYLOPRIM) 300 MG tablet TAKE 1 TABLET BY MOUTH EVERY DAY   aspirin EC 81 MG tablet Take 81 mg by mouth daily.   atorvastatin (LIPITOR) 20 MG tablet Take 1 tablet (20 mg total) by mouth daily.   Calcium Carb-Cholecalciferol (CALCIUM 600 + D PO) Take 1 tablet by mouth daily.   carvedilol (COREG) 3.125 MG tablet Take 1 tablet (3.125 mg total) by mouth 2 (two) times daily with a meal.   diphenhydramine-acetaminophen (TYLENOL PM) 25-500 MG TABS Take 1 tablet by mouth at bedtime as needed (sleep).   eszopiclone (LUNESTA) 1 MG TABS tablet Take 1 tablet (1 mg total) by mouth at bedtime as needed for sleep. Take immediately before bedtime   furosemide (LASIX) 40 MG tablet TAKE 1 TABLET BY MOUTH EVERY DAY   glimepiride (AMARYL) 2 MG tablet TAKE 1 TABLET BY MOUTH EVERY DAY WITH BREAKFAST   glucose blood (ONETOUCH VERIO) test strip Check Blood Sugar 3 times daily.   hyoscyamine (LEVSIN SL) 0.125 MG SL tablet Place 1 tablet  (0.125 mg total) under the tongue every 6 (six) hours as needed.   insulin glargine (LANTUS SOLOSTAR) 100 UNIT/ML Solostar Pen Inject 12 Units into the skin daily.   Insulin Pen Needle (BD PEN NEEDLE NANO 2ND GEN) 32G X 4 MM MISC USE AS DIRECTED WITH LANTUS ONCE A DAY.   irbesartan (AVAPRO) 75 MG tablet Take 1 tablet (75 mg total) by mouth daily.   Lancets (ONETOUCH DELICA PLUS LANCET33G) MISC USE AS DIRECTED TO TEST DAILY   meclizine (ANTIVERT) 12.5 MG tablet Take 1 tablet (12.5 mg total) by mouth 3 (three) times daily as needed for dizziness. As needed for   Dizziness or nausea   nystatin cream (MYCOSTATIN) Apply 1 application topically 2 (two) times daily.   ramelteon (ROZEREM) 8 MG tablet Take 1 tablet (8 mg total) by mouth at bedtime.   Vitamin D, Ergocalciferol, (DRISDOL) 1.25 MG (50000 UNIT) CAPS capsule Take 1 capsule (50,000 Units total) by mouth every 7 (seven) days. TAKE 1 CAPSULE (50,000 UNITS TOTAL) BY MOUTH EVERY 7 (SEVEN) DAYS   [DISCONTINUED] glyBURIDE-metformin (GLUCOVANCE) 1.25-250 MG tablet Take 1 tablet by mouth 2 (two) times daily with a meal.   No current facility-administered medications on file prior to visit.     No Known Allergies  Review Of Systems:  Constitutional:   No  weight loss, night sweats,  Fevers, chills, fatigue, or  lassitude.  HEENT:   No headaches,  Difficulty swallowing,  Tooth/dental problems, or  Sore throat,                No sneezing, itching, ear ache, nasal congestion, post nasal drip,   CV:  No chest pain,  Orthopnea, PND, swelling in lower extremities, anasarca, dizziness, palpitations, syncope.   GI  No heartburn, indigestion, abdominal pain, nausea, vomiting, diarrhea, change in bowel habits, loss of appetite, bloody stools.   Resp: No shortness of breath with exertion or at rest.  No excess mucus, no productive cough,  No non-productive cough,  No coughing up of blood.  No change in color of mucus.  No wheezing.  No chest wall  deformity  Skin: no rash or lesions.  GU: no dysuria, change in color of urine, no urgency or frequency.  No flank pain, no hematuria   MS:  No joint pain or swelling.  No decreased range of motion.  No back pain.  Psych:  No change in mood or affect. No depression or anxiety.  No memory loss.   Vital Signs BP 125/75 (BP Location: Left Arm, Patient Position: Sitting, Cuff Size: Normal)   Pulse 84   Temp 97.6 F (36.4 C) (Oral)   Ht 5\' 4"  (1.626 m)   Wt 215 lb 6.4 oz (97.7 kg)   SpO2 94%   BMI 36.97 kg/m    Physical Exam:  General- No distress,  A&O x 3, pleasant and appropriate ENT: No sinus tenderness, TM clear, pale nasal mucosa, no oral exudate,no post nasal drip, no LAN Cardiac: S1, S2, regular rate and rhythm, no murmur Chest: No wheeze/ rales/ dullness; no accessory muscle use, no nasal flaring, no sternal retractions, diminished per bases Abd.: Soft Non-tender, nondistended, bowel sounds positive,Body mass index is 36.97 kg/m.  Ext: No clubbing cyanosis, edema, no obvious deformities Neuro:  normal strength, moving all extremities x 4, alert and oriented x 3, Skin: No rashes, warm and dry, no obvious lesions Psych: normal mood and behavior   Assessment/Plan  Recurrent Renal Cell Carcinoma History of nephrectomy in 2001, lung resection in 2008 and 2014, and cryoablation of right renal neoplasm in 2020.  Recent surveillance CT scan in October 2024 showed a new 13mm nodule in the right lower lobe of the lung, which has increased in size to 1.8cm as of December 2024. Enlarging subcarinal nodes concerning for nodal metastasis Plan -Schedule bronchoscopy for biopsy of right lower lobe nodule to determine if it is recurrent renal cell carcinoma or another pathology. -Stop aspirin 2 days prior to procedure to minimize bleeding risk.  Shortness of Breath Reports difficulty breathing with exertion only that she feels is related to physical deconditioning. Plan -Consider  further evaluation post-biopsy depending on results.  Sleep Apnea Undergoing evaluation for sleep apnea. -Follow up with Dr. Wynona Neat for results and management plan as is scheduled.  Prediabetes Managed with daily medication. Plan -Continue current management.  General Health Maintenance -Consider additional CT scan for mapping prior to bronchoscopy, pending discussion with Dr. Sammie Bench. -Follow up post-bronchoscopy to discuss biopsy results and next steps.  I spent 45 minutes dedicated to the care of this patient on the date of this encounter to include pre-visit review of records, face-to-face time with the patient discussing conditions above, post visit ordering of testing, clinical documentation with the electronic health record, making appropriate referrals as documented, and communicating necessary information to the patient's healthcare team.    Bevelyn Ngo, NP 02/07/2023  9:08 AM

## 2023-02-08 ENCOUNTER — Ambulatory Visit (INDEPENDENT_AMBULATORY_CARE_PROVIDER_SITE_OTHER): Payer: No Typology Code available for payment source | Admitting: Family Medicine

## 2023-02-08 ENCOUNTER — Encounter: Payer: Self-pay | Admitting: Acute Care

## 2023-02-08 ENCOUNTER — Other Ambulatory Visit: Payer: Self-pay | Admitting: Family Medicine

## 2023-02-08 VITALS — BP 100/62 | HR 75 | Temp 98.2°F | Resp 18 | Ht 64.0 in | Wt 215.4 lb

## 2023-02-08 DIAGNOSIS — N289 Disorder of kidney and ureter, unspecified: Secondary | ICD-10-CM

## 2023-02-08 DIAGNOSIS — E119 Type 2 diabetes mellitus without complications: Secondary | ICD-10-CM | POA: Diagnosis not present

## 2023-02-08 DIAGNOSIS — E785 Hyperlipidemia, unspecified: Secondary | ICD-10-CM

## 2023-02-08 DIAGNOSIS — E538 Deficiency of other specified B group vitamins: Secondary | ICD-10-CM

## 2023-02-08 DIAGNOSIS — E559 Vitamin D deficiency, unspecified: Secondary | ICD-10-CM

## 2023-02-08 DIAGNOSIS — I5022 Chronic systolic (congestive) heart failure: Secondary | ICD-10-CM | POA: Diagnosis not present

## 2023-02-08 DIAGNOSIS — M1A9XX0 Chronic gout, unspecified, without tophus (tophi): Secondary | ICD-10-CM

## 2023-02-08 LAB — CBC WITH DIFFERENTIAL/PLATELET
Basophils Absolute: 0 10*3/uL (ref 0.0–0.1)
Basophils Relative: 0.5 % (ref 0.0–3.0)
Eosinophils Absolute: 0.2 10*3/uL (ref 0.0–0.7)
Eosinophils Relative: 3.1 % (ref 0.0–5.0)
HCT: 40 % (ref 36.0–46.0)
Hemoglobin: 13 g/dL (ref 12.0–15.0)
Lymphocytes Relative: 26.4 % (ref 12.0–46.0)
Lymphs Abs: 2 10*3/uL (ref 0.7–4.0)
MCHC: 32.6 g/dL (ref 30.0–36.0)
MCV: 101.7 fL — ABNORMAL HIGH (ref 78.0–100.0)
Monocytes Absolute: 0.5 10*3/uL (ref 0.1–1.0)
Monocytes Relative: 7 % (ref 3.0–12.0)
Neutro Abs: 4.8 10*3/uL (ref 1.4–7.7)
Neutrophils Relative %: 63 % (ref 43.0–77.0)
Platelets: 161 10*3/uL (ref 150.0–400.0)
RBC: 3.93 Mil/uL (ref 3.87–5.11)
RDW: 15.9 % — ABNORMAL HIGH (ref 11.5–15.5)
WBC: 7.6 10*3/uL (ref 4.0–10.5)

## 2023-02-08 LAB — COMPREHENSIVE METABOLIC PANEL WITH GFR
ALT: 36 U/L — ABNORMAL HIGH (ref 0–35)
AST: 40 U/L — ABNORMAL HIGH (ref 0–37)
Albumin: 4.2 g/dL (ref 3.5–5.2)
Alkaline Phosphatase: 70 U/L (ref 39–117)
BUN: 32 mg/dL — ABNORMAL HIGH (ref 6–23)
CO2: 27 meq/L (ref 19–32)
Calcium: 10.2 mg/dL (ref 8.4–10.5)
Chloride: 101 meq/L (ref 96–112)
Creatinine, Ser: 1.55 mg/dL — ABNORMAL HIGH (ref 0.40–1.20)
GFR: 31.61 mL/min — ABNORMAL LOW
Glucose, Bld: 156 mg/dL — ABNORMAL HIGH (ref 70–99)
Potassium: 4.9 meq/L (ref 3.5–5.1)
Sodium: 137 meq/L (ref 135–145)
Total Bilirubin: 1 mg/dL (ref 0.2–1.2)
Total Protein: 7 g/dL (ref 6.0–8.3)

## 2023-02-08 LAB — LIPID PANEL
Cholesterol: 191 mg/dL (ref 0–200)
HDL: 47.4 mg/dL
LDL Cholesterol: 89 mg/dL (ref 0–99)
NonHDL: 143.92
Total CHOL/HDL Ratio: 4
Triglycerides: 275 mg/dL — ABNORMAL HIGH (ref 0.0–149.0)
VLDL: 55 mg/dL — ABNORMAL HIGH (ref 0.0–40.0)

## 2023-02-08 LAB — TSH: TSH: 2.29 u[IU]/mL (ref 0.35–5.50)

## 2023-02-08 LAB — MICROALBUMIN / CREATININE URINE RATIO
Creatinine,U: 108 mg/dL
Microalb Creat Ratio: 0.6 mg/g (ref 0.0–30.0)
Microalb, Ur: <0.7 mg/dL (ref 0.0–1.9)

## 2023-02-08 LAB — VITAMIN D 25 HYDROXY (VIT D DEFICIENCY, FRACTURES): VITD: 72.4 ng/mL (ref 30.00–100.00)

## 2023-02-08 LAB — VITAMIN B12: Vitamin B-12: 784 pg/mL (ref 211–911)

## 2023-02-08 LAB — HEMOGLOBIN A1C: Hgb A1c MFr Bld: 7.6 % — ABNORMAL HIGH (ref 4.6–6.5)

## 2023-02-08 NOTE — Patient Instructions (Signed)

## 2023-02-10 ENCOUNTER — Encounter: Payer: Self-pay | Admitting: Family Medicine

## 2023-02-10 NOTE — Progress Notes (Signed)
 Subjective:    Patient ID: Amanda Crawford, female    DOB: May 16, 1945, 78 y.o.   MRN: [id number]  Chief Complaint  Patient presents with  . Follow-up    4 month    HPI Discussed the use of AI scribe software for clinical note transcription with the patient, who gave verbal consent to proceed.  History of Present Illness   The patient, with a history of diverticulitis and a potential hernia, presents with sleep disturbances. They report difficulty falling asleep, often not achieving sleep until 2 am, and then waking up around 4 or 4:30 am. They then return to sleep and often do not wake until around noon. The patient expresses frustration with this disrupted sleep pattern and its impact on their daily routine. They also mention a recent three-day sleep study, but results are not yet available.  The patient also discusses an upcoming bronchoscopy due to a growing lesion in the lung. They express anxiety about the procedure and the potential for a cancer diagnosis. They mention that the lesion could be a recurrence of a previous issue or a different type of lung cancer.  The patient also reports a recent diverticulitis flare-up around Christmas, which has since resolved. They mention a cancelled colonoscopy due to this flare-up. Additionally, the patient mentions a potential hernia, which causes occasional discomfort.  The patient also discusses issues with hydration, noting that they often drink large amounts of liquid after working in the yard, which leads to frequent urination. They express a desire to better balance their hydration throughout the day.        Past Medical History:  Diagnosis Date  . Allergic state 03/12/2015  . Arthritis   . Colon polyp 11/19/2014  . Dermatitis 03/12/2015  . Diarrhea 06/29/2016  . Diverticulitis   . DM (diabetes mellitus), type 2 (HCC) 06/29/2016  . GERD (gastroesophageal reflux disease)    occ  . Gout 03/12/2015  . H/O measles   . H/O mumps   .  Headache(784.0)    migraines  . History of chicken pox 11/23/2014  . Hyperglycemia 06/29/2016  . Lung cancer (HCC)   . Migraine 11/23/2014  . Obesity 11/23/2014  . Pneumonia    child  . Preventative health care 09/12/2015  . Primary hypertension 12/15/2020  . Renal cell cancer (HCC)    renal cell ca dx 9/01 and 8/08;  . Renal insufficiency   . Shortness of breath    occ  . Skin lesion of breast 03/12/2015    Past Surgical History:  Procedure Laterality Date  . ABDOMINAL HYSTERECTOMY    . ANAL RECTAL MANOMETRY N/A 06/29/2021   Procedure: ANO RECTAL MANOMETRY;  Surgeon: San Sandor GAILS, DO;  Location: [facility];  Service: Gastroenterology;  Laterality: N/A;  . APPENDECTOMY    . CARDIAC CATHETERIZATION     yrs ago neg  . CHOLECYSTECTOMY    . COLONOSCOPY  2018  . ESOPHAGOGASTRODUODENOSCOPY     years ago  . IR RADIOLOGIST EVAL & MGMT  07/17/2018  . IR RADIOLOGIST EVAL & MGMT  09/12/2018  . IR RADIOLOGIST EVAL & MGMT  06/18/2019  . IR RADIOLOGIST EVAL & MGMT  01/13/2020  . IR RADIOLOGIST EVAL & MGMT  07/08/2020  . IR RADIOLOGIST EVAL & MGMT  10/12/2020  . KIDNEY SURGERY  2001   Removed of left kidney   . LUNG CANCER SURGERY Left 08  . RADIOLOGY WITH ANESTHESIA N/A 08/21/2018   Procedure: CT WITH ANESTHESIA RENAL CRYOABLATION;  Surgeon: Johann Sieving, MD;  Location: [facility];  Service: Radiology;  Laterality: N/A;  . RENAL MASS EXCISION Left 01  . RIGHT/LEFT HEART CATH AND CORONARY ANGIOGRAPHY N/A 07/27/2016   Procedure: Right/Left Heart Cath and Coronary Angiography;  Surgeon: Anner Alm ORN, MD;  [facility] INVASIVE CV LAB;  Service: Cardiovascular;  Laterality: N/A;  . THORACOTOMY Left 05/09/2012   Procedure: THORACOTOMY MAJOR;  Surgeon: Dorise MARLA Fellers, MD;  Location: [facility] OR;  Service: Thoracic;  Laterality: Left;  SABRA VIDEO BRONCHOSCOPY N/A 05/09/2012   Procedure: VIDEO BRONCHOSCOPY;  Surgeon: Dorise MARLA Fellers, MD;  Location: [facility] OR;  Service: Thoracic;  Laterality: N/A;  . WEDGE  RESECTION Left 05/09/2012   Procedure: LEFT UPPER LOBE WEDGE RESECTION;  Surgeon: Dorise MARLA Fellers, MD;  Location: [facility] OR;  Service: Thoracic;  Laterality: Left;    Family History  Problem Relation Age of Onset  . Dementia Mother   . Heart failure Father   . Diabetes Father   . Hyperlipidemia Father   . Heart disease Father   . Hypertension Father   . Asthma Brother   . Asthma Daughter   . Alcohol abuse Maternal Aunt   . Colon cancer Paternal Aunt   . Cancer Paternal Grandmother   . Hearing loss Paternal Grandfather   . Stroke Paternal Grandfather     Social History   Socioeconomic History  . Marital status: Married    Spouse name: Not on file  . Number of children: Not on file  . Years of education: Not on file  . Highest education level: Not on file  Occupational History  . Occupation: retired  Tobacco Use  . Smoking status: Former    Current packs/day: 0.00    Average packs/day: 0.5 packs/day for 35.0 years (17.5 ttl pk-yrs)    Types: Cigarettes    Start date: 05/08/1971    Quit date: 05/08/2006    Years since quitting: 16.7  . Smokeless tobacco: Never  Vaping Use  . Vaping status: Never Used  Substance and Sexual Activity  . Alcohol use: Yes    Comment: seldomly  . Drug use: No  . Sexual activity: Not Currently    Comment: lives with husband, no dietary restrictions.   Other Topics Concern  . Not on file  Social History Narrative  . Not on file   Social Drivers of Health   Financial Resource Strain: Low Risk  (07/05/2020)   Overall Financial Resource Strain (CARDIA)   . Difficulty of Paying Living Expenses: Not hard at all  Food Insecurity: No Food Insecurity (12/04/2022)   Hunger Vital Sign   . Worried About Programme Researcher, Broadcasting/film/video in the Last Year: Never true   . Ran Out of Food in the Last Year: Never true  Transportation Needs: No Transportation Needs (12/04/2022)   PRAPARE - Transportation   . Lack of Transportation (Medical): No   . Lack of Transportation  (Non-Medical): No  Physical Activity: Inactive (07/05/2020)   Exercise Vital Sign   . Days of Exercise per Week: 0 days   . Minutes of Exercise per Session: 0 min  Stress: No Stress Concern Present (08/12/2022)   Received from [facility] - Occupational Stress Questionnaire   . Feeling of Stress : Not at all  Social Connections: Unknown (06/06/2021)   Received from [facility]   Social Network   . Social Network: Not on file  Intimate Partner Violence: Not At Risk (  12/04/2022)   Humiliation, Afraid, Rape, and Kick questionnaire   . Fear of Current or Ex-Partner: No   . Emotionally Abused: No   . Physically Abused: No   . Sexually Abused: No    Outpatient Medications Prior to Visit  Medication Sig Dispense Refill  . allopurinol  (ZYLOPRIM ) 300 MG tablet TAKE 1 TABLET BY MOUTH EVERY DAY 90 tablet 1  . aspirin  EC 81 MG tablet Take 81 mg by mouth daily.    . atorvastatin  (LIPITOR) 20 MG tablet Take 1 tablet (20 mg total) by mouth daily. 90 tablet 3  . Calcium  Carb-Cholecalciferol (CALCIUM  600 + D PO) Take 1 tablet by mouth daily.    . carvedilol  (COREG ) 3.125 MG tablet Take 1 tablet (3.125 mg total) by mouth 2 (two) times daily with a meal. 60 tablet 3  . diphenhydramine -acetaminophen  (TYLENOL  PM) 25-500 MG TABS Take 1 tablet by mouth at bedtime as needed (sleep).    . eszopiclone  (LUNESTA ) 1 MG TABS tablet Take 1 tablet (1 mg total) by mouth at bedtime as needed for sleep. Take immediately before bedtime 30 tablet 0  . furosemide  (LASIX ) 40 MG tablet TAKE 1 TABLET BY MOUTH EVERY DAY 90 tablet 3  . glimepiride  (AMARYL ) 2 MG tablet TAKE 1 TABLET BY MOUTH EVERY DAY WITH BREAKFAST 90 tablet 1  . glucose blood (ONETOUCH VERIO) test strip Check Blood Sugar 3 times daily. 100 strip 4  . hyoscyamine  (LEVSIN  SL) 0.125 MG SL tablet Place 1 tablet (0.125 mg total) under the tongue every 6 (six) hours as needed. 30 tablet 1  . insulin  glargine  (LANTUS  SOLOSTAR) 100 UNIT/ML Solostar Pen Inject 12 Units into the skin daily. 15 mL 1  . Insulin  Pen Needle (BD PEN NEEDLE NANO 2ND GEN) 32G X 4 MM MISC USE AS DIRECTED WITH LANTUS  ONCE A DAY. 100 each 3  . irbesartan  (AVAPRO ) 75 MG tablet Take 1 tablet (75 mg total) by mouth daily. 90 tablet 3  . Lancets (ONETOUCH DELICA PLUS LANCET33G) MISC USE AS DIRECTED TO TEST DAILY 100 each 12  . meclizine  (ANTIVERT ) 12.5 MG tablet Take 1 tablet (12.5 mg total) by mouth 3 (three) times daily as needed for dizziness. As needed for   Dizziness or nausea 30 tablet 1  . nystatin  cream (MYCOSTATIN ) Apply 1 application topically 2 (two) times daily. 30 g 1  . ramelteon  (ROZEREM ) 8 MG tablet Take 1 tablet (8 mg total) by mouth at bedtime. 30 tablet 1  . Vitamin D , Ergocalciferol , (DRISDOL ) 1.25 MG (50000 UNIT) CAPS capsule Take 1 capsule (50,000 Units total) by mouth every 7 (seven) days. TAKE 1 CAPSULE (50,000 UNITS TOTAL) BY MOUTH EVERY 7 (SEVEN) DAYS 5 capsule 4   No facility-administered medications prior to visit.    No Known Allergies  Review of Systems  Constitutional:  Negative for fever and malaise/fatigue.  HENT:  Negative for congestion.   Eyes:  Negative for blurred vision.  Respiratory:  Negative for shortness of breath.   Cardiovascular:  Negative for chest pain, palpitations and leg swelling.  Gastrointestinal:  Negative for abdominal pain, blood in stool and nausea.  Genitourinary:  Negative for dysuria and frequency.  Musculoskeletal:  Negative for falls.  Skin:  Negative for rash.  Neurological:  Negative for dizziness, loss of consciousness and headaches.  Endo/Heme/Allergies:  Negative for environmental allergies.  Psychiatric/Behavioral:  Negative for depression. The patient has insomnia. The patient is not nervous/anxious.        Objective:  Physical Exam Constitutional:      General: She is not in acute distress.    Appearance: Normal appearance. She is well-developed.  She is not toxic-appearing.  HENT:     Head: Normocephalic and atraumatic.     Right Ear: External ear normal.     Left Ear: External ear normal.     Nose: Nose normal.  Eyes:     General:        Right eye: No discharge.        Left eye: No discharge.     Conjunctiva/sclera: Conjunctivae normal.  Neck:     Thyroid : No thyromegaly.  Cardiovascular:     Rate and Rhythm: Normal rate and regular rhythm.     Heart sounds: Normal heart sounds. No murmur heard. Pulmonary:     Effort: Pulmonary effort is normal. No respiratory distress.     Breath sounds: Normal breath sounds.  Abdominal:     General: Bowel sounds are normal.     Palpations: Abdomen is soft.     Tenderness: There is no abdominal tenderness. There is no guarding.  Musculoskeletal:        General: Normal range of motion.     Cervical back: Neck supple.  Lymphadenopathy:     Cervical: No cervical adenopathy.  Skin:    General: Skin is warm and dry.  Neurological:     Mental Status: She is alert and oriented to person, place, and time.  Psychiatric:        Mood and Affect: Mood normal.        Behavior: Behavior normal.        Thought Content: Thought content normal.        Judgment: Judgment normal.    BP 100/62 (BP Location: Left Arm, Patient Position: Sitting, Cuff Size: Large)   Pulse 75   Temp 98.2 F (36.8 C) (Oral)   Resp 18   Ht 5' 4 (1.626 m)   Wt 215 lb 6.4 oz (97.7 kg)   SpO2 97%   BMI 36.97 kg/m  Wt Readings from Last 3 Encounters:  02/08/23 215 lb 6.4 oz (97.7 kg)  02/07/23 214 lb 12.8 oz (97.4 kg)  02/07/23 215 lb 6.4 oz (97.7 kg)    Diabetic Foot Exam - Simple   No data filed    Lab Results  Component Value Date   WBC 7.6 02/08/2023   HGB 13.0 02/08/2023   HCT 40.0 02/08/2023   PLT 161.0 02/08/2023   GLUCOSE 156 (H) 02/08/2023   CHOL 191 02/08/2023   TRIG 275.0 (H) 02/08/2023   HDL 47.40 02/08/2023   LDLDIRECT 71.0 04/11/2022   LDLCALC 89 02/08/2023   ALT 36 (H) 02/08/2023    AST 40 (H) 02/08/2023   NA 137 02/08/2023   K 4.9 02/08/2023   CL 101 02/08/2023   CREATININE 1.55 (H) 02/08/2023   BUN 32 (H) 02/08/2023   CO2 27 02/08/2023   TSH 2.29 02/08/2023   INR 1.1 (H) 03/22/2021   HGBA1C 7.6 (H) 02/08/2023   MICROALBUR <0.7 02/08/2023    Lab Results  Component Value Date   TSH 2.29 02/08/2023   Lab Results  Component Value Date   WBC 7.6 02/08/2023   HGB 13.0 02/08/2023   HCT 40.0 02/08/2023   MCV 101.7 (H) 02/08/2023   PLT 161.0 02/08/2023   Lab Results  Component Value Date   NA 137 02/08/2023   K 4.9 02/08/2023   CHLORIDE 104 11/01/2016   CO2 27  02/08/2023   GLUCOSE 156 (H) 02/08/2023   BUN 32 (H) 02/08/2023   CREATININE 1.55 (H) 02/08/2023   BILITOT 1.0 02/08/2023   ALKPHOS 70 02/08/2023   AST 40 (H) 02/08/2023   ALT 36 (H) 02/08/2023   PROT 7.0 02/08/2023   ALBUMIN  4.2 02/08/2023   CALCIUM  10.2 02/08/2023   ANIONGAP 8 02/07/2023   EGFR 50 (L) 11/01/2016   GFR 31.61 (L) 02/08/2023   Lab Results  Component Value Date   CHOL 191 02/08/2023   Lab Results  Component Value Date   HDL 47.40 02/08/2023   Lab Results  Component Value Date   LDLCALC 89 02/08/2023   Lab Results  Component Value Date   TRIG 275.0 (H) 02/08/2023   Lab Results  Component Value Date   CHOLHDL 4 02/08/2023   Lab Results  Component Value Date   HGBA1C 7.6 (H) 02/08/2023       Assessment & Plan:  Chronic systolic heart failure (HCC) Assessment & Plan: No recent exacerbation, no changes  Orders: -     CBC with Differential/Platelet -     Comprehensive metabolic panel -     TSH  Type 2 diabetes mellitus without complication, without long-term current use of insulin  (HCC) Assessment & Plan: hgba1c acceptable, minimize simple carbs. Increase exercise as tolerated. Continue current meds   Orders: -     CBC with Differential/Platelet -     Comprehensive metabolic panel -     TSH -     Hemoglobin A1c -     Microalbumin / creatinine urine  ratio  Chronic gout without tophus, unspecified cause, unspecified site Assessment & Plan: Hydrate and monitor    Hyperlipidemia, unspecified hyperlipidemia type Assessment & Plan: Encourage heart healthy diet such as MIND or DASH diet, increase exercise, avoid trans fats, simple carbohydrates and processed foods, consider a krill or fish or flaxseed oil cap daily.    Orders: -     Lipid panel  Renal insufficiency Assessment & Plan: Hydrate and monitor   Orders: -     TSH  Vitamin D  deficiency Assessment & Plan: Supplement and monitor   Orders: -     VITAMIN D  25 Hydroxy (Vit-D Deficiency, Fractures)  Disorder of vitamin B12 -     Vitamin B12    Assessment and Plan    Lung Nodule Growth observed on imaging, requiring biopsy for definitive diagnosis. Differential includes recurrence of previous lung cancer, new primary lung cancer, or benign growth. -Await biopsy results for definitive diagnosis.  Sleep Disturbance Recent sleep study performed, results pending. Patient reports difficulty falling asleep and disrupted sleep-wake cycle. -Encourage sleep compression technique to improve sleep hygiene. -Consider Rozerem  for sleep aid, pending sleep study results.  Vitamin D  Deficiency Previous low levels, currently on supplementation. -Check Vitamin D  levels today to assess response to supplementation.  Hyperlipidemia Elevated triglycerides noted on previous labs. -Check lipid panel today.  Possible Hernia Patient reports chronic soreness in surgical scar area, possible hernia suggested by another provider. -Consider further evaluation if symptoms persist or worsen.  General Health Maintenance -Check A1c today. -Encourage regular hydration, aiming for 10 ounces every hour or two. -Schedule follow-up virtual visit in 6-8 weeks and in-person visit in 3-4 months.         Harlene Horton, MD

## 2023-02-13 ENCOUNTER — Encounter: Payer: Self-pay | Admitting: Pulmonary Disease

## 2023-02-13 ENCOUNTER — Ambulatory Visit (INDEPENDENT_AMBULATORY_CARE_PROVIDER_SITE_OTHER): Payer: No Typology Code available for payment source | Admitting: Pulmonary Disease

## 2023-02-13 ENCOUNTER — Telehealth: Payer: Self-pay | Admitting: Pulmonary Disease

## 2023-02-13 VITALS — BP 102/62 | HR 74 | Temp 97.7°F | Ht 64.0 in | Wt 214.0 lb

## 2023-02-13 DIAGNOSIS — G4733 Obstructive sleep apnea (adult) (pediatric): Secondary | ICD-10-CM

## 2023-02-13 DIAGNOSIS — G4700 Insomnia, unspecified: Secondary | ICD-10-CM

## 2023-02-13 HISTORY — DX: Insomnia, unspecified: G47.00

## 2023-02-13 MED ORDER — ZOLPIDEM TARTRATE 5 MG PO TABS: 5.0000 mg | ORAL_TABLET | Freq: Every evening | ORAL | 2 refills | Status: DC | PRN

## 2023-02-13 NOTE — Telephone Encounter (Signed)
 Will discuss in office at her appt on 02/13/23

## 2023-02-13 NOTE — Patient Instructions (Signed)
 I will see you back in about 3 months  We will contact the medical supply company to set you up with a CPAP for your sleep apnea  Prescription for Ambien  5 mg called into pharmacy for you for insomnia  Good luck with your procedure next week-bronchoscopy  Call us  with significant concerns  It does take most people a few days to get used to using CPAP, just on make sure you put it on every night

## 2023-02-13 NOTE — Progress Notes (Signed)
 Amanda Crawford    [id number]    1946/01/26  Primary Care Physician:[name], Harlene LABOR, MD  Referring Physician: Domenica Harlene LABOR, MD [address] [place],  [place] [address]  Chief complaint:   Patient is being seen for insomnia  HPI:  Between the last time she was here and presently she had a sleep study done showing moderate obstructive sleep apnea with AHI of 18.5 We did discuss the findings on the report and agrees to start CPAP  She has chronic insomnia  Lunesta  is unaffordable at about $400, prescription was placed for Rozerem  but has not been able to get Rozerem  as well Will place a prescription for Ambien  5 mg  She is having a bronchoscopy for a lung nodule performed next week  She feels fine otherwise  Reformed smoker quit about 2008  Has a history of kidney cancer for which she had a resection, history of lung cancer for which she had 2 resections, first in 2008, second one in 2014  Has never used any sleep aids, occasionally takes Tylenol  PM  Usually goes to bed about 11 PM, sometimes takes up to 2 hours before she is able to fall asleep 2-3 awakenings  Variable awakening time between 9 and 10 AM  Weight has been stable, may be up about 5 pounds  Does have dryness of the mouth in the morning No morning headaches Occasional night sweats Never been told about apneic breathing   Outpatient Encounter Medications as of 02/13/2023  Medication Sig   allopurinol  (ZYLOPRIM ) 300 MG tablet TAKE 1 TABLET BY MOUTH EVERY DAY   aspirin  EC 81 MG tablet Take 81 mg by mouth daily.   atorvastatin  (LIPITOR) 20 MG tablet Take 1 tablet (20 mg total) by mouth daily.   Calcium  Carb-Cholecalciferol (CALCIUM  600 + D PO) Take 1 tablet by mouth daily.   carvedilol  (COREG ) 3.125 MG tablet Take 1 tablet (3.125 mg total) by mouth 2 (two) times daily with a meal.   diphenhydramine -acetaminophen  (TYLENOL  PM) 25-500 MG TABS Take 1 tablet by mouth at bedtime as needed  (sleep).   eszopiclone  (LUNESTA ) 1 MG TABS tablet Take 1 tablet (1 mg total) by mouth at bedtime as needed for sleep. Take immediately before bedtime   furosemide  (LASIX ) 40 MG tablet TAKE 1 TABLET BY MOUTH EVERY DAY   glimepiride  (AMARYL ) 2 MG tablet TAKE 1 TABLET BY MOUTH EVERY DAY WITH BREAKFAST   glucose blood (ONETOUCH VERIO) test strip Check Blood Sugar 3 times daily.   hyoscyamine  (LEVSIN  SL) 0.125 MG SL tablet Place 1 tablet (0.125 mg total) under the tongue every 6 (six) hours as needed.   insulin  glargine (LANTUS  SOLOSTAR) 100 UNIT/ML Solostar Pen Inject 12 Units into the skin daily.   Insulin  Pen Needle (BD PEN NEEDLE NANO 2ND GEN) 32G X 4 MM MISC USE AS DIRECTED WITH LANTUS  ONCE A DAY.   irbesartan  (AVAPRO ) 75 MG tablet Take 1 tablet (75 mg total) by mouth daily.   Lancets (ONETOUCH DELICA PLUS LANCET33G) MISC USE AS DIRECTED TO TEST DAILY   meclizine  (ANTIVERT ) 12.5 MG tablet Take 1 tablet (12.5 mg total) by mouth 3 (three) times daily as needed for dizziness. As needed for   Dizziness or nausea   nystatin  cream (MYCOSTATIN ) Apply 1 application topically 2 (two) times daily.   ramelteon  (ROZEREM ) 8 MG tablet Take 1 tablet (8 mg total) by mouth at bedtime.   Vitamin D , Ergocalciferol , (  DRISDOL ) 1.25 MG (50000 UNIT) CAPS capsule Take 1 capsule (50,000 Units total) by mouth every 7 (seven) days. TAKE 1 CAPSULE (50,000 UNITS TOTAL) BY MOUTH EVERY 7 (SEVEN) DAYS   [DISCONTINUED] glyBURIDE -metformin  (GLUCOVANCE ) 1.25-250 MG tablet Take 1 tablet by mouth 2 (two) times daily with a meal.   No facility-administered encounter medications on file as of 02/13/2023.    Allergies as of 02/13/2023   (No Known Allergies)    Past Medical History:  Diagnosis Date   Allergic state 03/12/2015   Arthritis    Colon polyp 11/19/2014   Dermatitis 03/12/2015   Diarrhea 06/29/2016   Diverticulitis    DM (diabetes mellitus), type 2 (HCC) 06/29/2016   GERD (gastroesophageal reflux disease)    occ    Gout 03/12/2015   H/O measles    H/O mumps    Headache(784.0)    migraines   History of chicken pox 11/23/2014   Hyperglycemia 06/29/2016   Lung cancer (HCC)    Migraine 11/23/2014   Obesity 11/23/2014   Pneumonia    child   Preventative health care 09/12/2015   Primary hypertension 12/15/2020   Renal cell cancer (HCC)    renal cell ca dx 9/01 and 8/08;   Renal insufficiency    Shortness of breath    occ   Skin lesion of breast 03/12/2015    Past Surgical History:  Procedure Laterality Date   ABDOMINAL HYSTERECTOMY     ANAL RECTAL MANOMETRY N/A 06/29/2021   Procedure: ANO RECTAL MANOMETRY;  Surgeon: San Sandor GAILS, DO;  Location: [facility];  Service: Gastroenterology;  Laterality: N/A;   APPENDECTOMY     CARDIAC CATHETERIZATION     yrs ago neg   CHOLECYSTECTOMY     COLONOSCOPY  2018   ESOPHAGOGASTRODUODENOSCOPY     years ago   IR RADIOLOGIST EVAL & MGMT  07/17/2018   IR RADIOLOGIST EVAL & MGMT  09/12/2018   IR RADIOLOGIST EVAL & MGMT  06/18/2019   IR RADIOLOGIST EVAL & MGMT  01/13/2020   IR RADIOLOGIST EVAL & MGMT  07/08/2020   IR RADIOLOGIST EVAL & MGMT  10/12/2020   KIDNEY SURGERY  2001   Removed of left kidney    LUNG CANCER SURGERY Left 08   RADIOLOGY WITH ANESTHESIA N/A 08/21/2018   Procedure: CT WITH ANESTHESIA RENAL CRYOABLATION;  Surgeon: Johann Sieving, MD;  Location: [facility];  Service: Radiology;  Laterality: N/A;   RENAL MASS EXCISION Left 01   RIGHT/LEFT HEART CATH AND CORONARY ANGIOGRAPHY N/A 07/27/2016   Procedure: Right/Left Heart Cath and Coronary Angiography;  Surgeon: Anner Alm ORN, MD;  Location: [facility] CV [facility];  Service: Cardiovascular;  Laterality: N/A;   THORACOTOMY Left 05/09/2012   Procedure: THORACOTOMY MAJOR;  Surgeon: Dorise MARLA Fellers, MD;  Location: [facility];  Service: Thoracic;  Laterality: Left;   VIDEO BRONCHOSCOPY N/A 05/09/2012   Procedure: VIDEO BRONCHOSCOPY;  Surgeon: Dorise MARLA Fellers, MD;  Location: [facility];  Service: Thoracic;  Laterality:  N/A;   WEDGE RESECTION Left 05/09/2012   Procedure: LEFT UPPER LOBE WEDGE RESECTION;  Surgeon: Dorise MARLA Fellers, MD;  Location: [facility];  Service: Thoracic;  Laterality: Left;    Family History  Problem Relation Age of Onset   Dementia Mother    Heart failure Father    Diabetes Father    Hyperlipidemia Father    Heart disease Father    Hypertension Father    Asthma Brother    Asthma Daughter    Alcohol abuse Maternal  Aunt    Colon cancer Paternal Aunt    Cancer Paternal Grandmother    Hearing loss Paternal Grandfather    Stroke Paternal Grandfather     Social History   Socioeconomic History   Marital status: Married    Spouse name: Not on file   Number of children: Not on file   Years of education: Not on file   Highest education level: Not on file  Occupational History   Occupation: retired  Tobacco Use   Smoking status: Former    Current packs/day: 0.00    Average packs/day: 0.5 packs/day for 35.0 years (17.5 ttl pk-yrs)    Types: Cigarettes    Start date: 05/08/1971    Quit date: 05/08/2006    Years since quitting: 16.7   Smokeless tobacco: Never  Vaping Use   Vaping status: Never Used  Substance and Sexual Activity   Alcohol use: Yes    Comment: seldomly   Drug use: No   Sexual activity: Not Currently    Comment: lives with husband, no dietary restrictions.   Other Topics Concern   Not on file  Social History Narrative   Not on file   Social Drivers of Health   Financial Resource Strain: Low Risk  (07/05/2020)   Overall Financial Resource Strain (CARDIA)    Difficulty of Paying Living Expenses: Not hard at all  Food Insecurity: No Food Insecurity (12/04/2022)   Hunger Vital Sign    Worried About Running Out of Food in the Last Year: Never true    Ran Out of Food in the Last Year: Never true  Transportation Needs: No Transportation Needs (12/04/2022)   PRAPARE - Administrator, Civil Service (Medical): No    Lack of Transportation (Non-Medical): No   Physical Activity: Inactive (07/05/2020)   Exercise Vital Sign    Days of Exercise per Week: 0 days    Minutes of Exercise per Session: 0 min  Stress: No Stress Concern Present (08/12/2022)   Received from [facility] - Occupational Stress Questionnaire    Feeling of Stress : Not at all  Social Connections: Unknown (06/06/2021)   Received from [facility]   Social Network    Social Network: Not on file  Intimate Partner Violence: Not At Risk (12/04/2022)   Humiliation, Afraid, Rape, and Kick questionnaire    Fear of Current or Ex-Partner: No    Emotionally Abused: No    Physically Abused: No    Sexually Abused: No    Review of Systems  Constitutional:  Positive for fatigue.  Respiratory:  Negative for apnea and shortness of breath.   Psychiatric/Behavioral:  Positive for sleep disturbance.     There were no vitals filed for this visit.    Physical Exam Constitutional:      Appearance: She is obese.  HENT:     Head: Normocephalic.     Mouth/Throat:     Mouth: Mucous membranes are moist.  Eyes:     General: No scleral icterus. Cardiovascular:     Rate and Rhythm: Normal rate and regular rhythm.     Heart sounds: No murmur heard.    No friction rub.  Pulmonary:     Effort: No respiratory distress.     Breath sounds: No stridor. No wheezing or rhonchi.  Musculoskeletal:     Cervical back: No rigidity or tenderness.  Neurological:     Mental Status: She is alert.  Psychiatric:  Mood and Affect: Mood normal.       11/28/2022    1:00 PM  Results of the Epworth flowsheet  Sitting and reading 1  Watching TV 1  Sitting, inactive in a public place (e.g. a theatre or a meeting) 0  As a passenger in a car for an hour without a break 0  Lying down to rest in the afternoon when circumstances permit 0  Sitting and talking to someone 0  Sitting quietly after a lunch without alcohol 1  In a car, while  stopped for a few minutes in traffic 0  Total score 3    Data Reviewed: Sleep study reviewed showing moderate obstructive sleep apnea with AHI of 18.5-was performed 01/24/2023  Assessment:  Insomnia -This continues -Multiple awakenings  Moderate obstructive sleep apnea -Agreeable to start CPAP therapy  Class II obesity  History of renal cancer in 2000, status post nephrectomy in 2001  Lung cancer x 2 for which she had resection via VATS  New lung nodule for which she is scheduled for navigational bronchoscopy  Post cryoablation of right renal cancer July 2020  Bronchiectasis on CT  Plan/Recommendations: Prescription for Ambien  5 mg placed for insomnia  Will initiate CPAP therapy 5-15 with heated humidification, will benefit from a fullface mask as she does have her mouth open sometimes  Tentative follow-up in about 3 months  Encouraged to call with significant concerns  I spent 30 minutes dedicated to the care of this patient on the date of this encounter to include previsit review of records, face-to-face time with the patient discussing conditions above, post visit ordering of testing,ordering medications,independentlyinterpreting results, clinical documentation with electronic health record and communicated necessary findings to members of the patient's care team  Jennet Epley MD [facility] 02/13/2023, 12:46 PM  CC: Domenica Harlene LABOR, MD

## 2023-02-13 NOTE — Telephone Encounter (Signed)
 Call patient  Sleep study result  Date of study: 01/24/2023  Impression: Moderate obstructive sleep apnea with moderate oxygen desaturations AHI of 18.5  Recommendation: DME referral  Recommend CPAP therapy for moderate obstructive sleep apnea  Auto titrating CPAP with pressure settings of 5-15 will be appropriate  Encourage weight loss measures  Follow-up in the office 4 to 6 weeks following initiation of treatment

## 2023-02-14 DIAGNOSIS — I700 Atherosclerosis of aorta: Secondary | ICD-10-CM | POA: Diagnosis not present

## 2023-02-14 DIAGNOSIS — N2889 Other specified disorders of kidney and ureter: Secondary | ICD-10-CM | POA: Diagnosis not present

## 2023-02-14 DIAGNOSIS — K7460 Unspecified cirrhosis of liver: Secondary | ICD-10-CM | POA: Diagnosis not present

## 2023-02-14 DIAGNOSIS — Z9049 Acquired absence of other specified parts of digestive tract: Secondary | ICD-10-CM | POA: Diagnosis not present

## 2023-02-16 ENCOUNTER — Encounter (HOSPITAL_COMMUNITY): Payer: Self-pay | Admitting: Emergency Medicine

## 2023-02-16 ENCOUNTER — Other Ambulatory Visit: Payer: Self-pay

## 2023-02-16 NOTE — Progress Notes (Signed)
PCP - Dr Danise Edge Cardiologist - Dr Zoila Shutter Gastroenterology- Dr Zachery Dakins Oncology - Dr Arlan Organ Pulmonology - Dr Virl Diamond  CT Chest x-ray - 01/10/23 EKG - 09/04/22 Stress Test - 08/14/22 CE ECHO - 08/14/22 CE Cardiac Cath - 07/27/16  ICD Pacemaker/Loop - n/a  Sleep Study -  Yes (recently dx 01/24/23)  CPAP - CPAP was ordered but not received yet  Diabetes Type 2 Fasting Blood Sugar - 140s-160s Checks Blood Sugar __1___ times a day  Do not take Glimepiride on the morning of surgery.     Take 7 units of Lantus Insulin on DOS.   If your blood sugar is less than 70 mg/dL, you will need to treat for low blood sugar: Treat a low blood sugar (less than 70 mg/dL) with  cup of clear juice (cranberry or apple), 4 glucose tablets, OR glucose gel. Recheck blood sugar in 15 minutes after treatment (to make sure it is greater than 70 mg/dL). If your blood sugar is not greater than 70 mg/dL on recheck, call [phone] for further instructions.  Aspirin Instructions: Hold ASA 2 days prior to procedure per patient, per MD.  Last dose will be on 02/17/23.  NPO  Anesthesia review: Yes  STOP now taking any Aspirin (unless otherwise instructed by your surgeon), Aleve, Naproxen, Ibuprofen, Motrin, Advil, Goody's, BC's, all herbal medications, fish oil, and all vitamins.   Coronavirus Screening Do you have any of the following symptoms:  Cough yes/no: No Fever (>100.60F)  yes/no: No Runny nose yes/no: No Sore throat yes/no: No Difficulty breathing/shortness of breath  Yes  Have you traveled in the last 14 days and where? yes/no: No  Patient verbalized understanding of instructions that were given via phone.

## 2023-02-16 NOTE — Progress Notes (Addendum)
Anesthesia Chart Review: Same-day workup  78 year old female follows with cardiology for history of nonischemic cardiomyopathy and left bundle branch block.  Recent echo 08/14/2022 showed EF 60 to 65%, mild diastolic dysfunction, mild tricuspid regurgitation.  Cath 2018 showed mild nonobstructive CAD.  Molli Hazard, NP.  Noted to be stable at that time, no changes to management, 13-month follow-up recommended.  Follows with oncologist Dr. Myna Hidalgo for history of metastatic renal carcinoma.  She had left nephrectomy in 2001. She developed a left lingular mass, status post VATS procedure.  Pathology revealed a recurrent renal cell carcinoma resected in October 2008. She developed relapse disease in 2014 and subsequently underwent a VATS and left upper lobe wedge resection of a tumor done on 05/09/2012. She is status post cryoablation of a right renal neoplasm on August 21, 2018.  Chest CT 01/10/23 showed some increase in mediastinal adenopathy.  She had a subcarinal lymph node measuring 1.2 cm, adjacent lymph node measuring 1.1 cm, right lower lobe pulmonary nodule measuring 1.8 cm.  Recently diagnosed with OSA on 02/13/2023, recommended to use CPAP.  Other pertinent history includes renal insufficiency, GERD, cirrhosis, IDDM2.  CMP and CBC from cancer center 02/08/2023 reviewed, creatinine mildly elevated at 1.55 (appears to be near baseline), other unremarkable.  A1c 7.6 at that time.  Patient will need day of surgery evaluation.  EKG 09/04/2022: Normal sinus rhythm.  Rate 83. Non-specific intra-ventricular conduction block  CT chest 01/10/23: IMPRESSION: 1. Interval increase in size of pulmonary nodule within the posterior-medial right lower lobe now measuring 1.8 cm. Previously 1.3 cm. Findings are concerning for metastatic disease. 2. Interval increase in size of subcarinal lymph nodes. Concerning for nodal metastasis. 3. Coronary artery calcifications. 4. Cirrhotic morphology of the liver. 5. Aortic  Atherosclerosis (ICD10-I70.0) and Emphysema (ICD10-J43.9).  TTE 08/14/2022 (Care Everywhere): Left Ventricle: Systolic function is normal. EF: 60-65%. Ejection  fraction measured by 3D is 55%,    Left Ventricle: Doppler parameters consistent with mild diastolic  dysfunction and low to normal LA pressure.    Left Ventricle: There is mild concentric hypertrophy.    Tricuspid Valve: There is mild regurgitation.   Cath 07/27/2016: Mid LAD lesion, 25 %stenosed. There is moderate left ventricular systolic dysfunction. LV end diastolic pressure is moderately elevated. The left ventricular ejection fraction is 35-45% by visual estimate. Hemodynamic findings consistent with mild pulmonary hypertension.     Nonischemic cardiomyopathy: EF appears more to be 35-45% with apical hypokinesis. Angiographically normal coronary arteries with a Circumflex dominant system.     Plan: There is clearly evidence of combined systolic and diastolic heart failure: We will start Lasix. - 40 mg twice a day for 2 days then 40 mm daily until follow-up Discharge after bedrest    Zannie Cove [facility]/Anesthesiology Phone [phone] 02/16/2023 12:29 PM

## 2023-02-16 NOTE — Anesthesia Preprocedure Evaluation (Addendum)
Anesthesia Evaluation  Patient identified by MRN, date of birth, ID band Patient awake    Reviewed: Allergy & Precautions, H&P , NPO status , Patient's Chart, lab work & pertinent test results  Airway Mallampati: II  TM Distance: >3 FB Neck ROM: Full    Dental no notable dental hx.    Pulmonary sleep apnea , former smoker LUNG NODULE, SUSPICIOUS RENAL CELL CARCINOMA METS   Pulmonary exam normal breath sounds clear to auscultation       Cardiovascular hypertension, + CAD  Normal cardiovascular exam Rhythm:Regular Rate:Normal     Neuro/Psych  Headaches  negative psych ROS   GI/Hepatic ,GERD  ,,(+) Cirrhosis         Endo/Other  negative endocrine ROSdiabetes, Type 2    Renal/GU Renal diseaseHx of RCC  negative genitourinary   Musculoskeletal  (+) Arthritis ,    Abdominal   Peds negative pediatric ROS (+)  Hematology  (+) Blood dyscrasia, anemia   Anesthesia Other Findings   Reproductive/Obstetrics negative OB ROS                             Anesthesia Physical Anesthesia Plan  ASA: 3  Anesthesia Plan: General   Post-op Pain Management:    Induction: Intravenous  PONV Risk Score and Plan: 2 and Ondansetron, Dexamethasone and Treatment may vary due to age or medical condition  Airway Management Planned: Oral ETT  Additional Equipment:   Intra-op Plan:   Post-operative Plan: Extubation in OR  Informed Consent: I have reviewed the patients History and Physical, chart, labs and discussed the procedure including the risks, benefits and alternatives for the proposed anesthesia with the patient or authorized representative who has indicated his/her understanding and acceptance.     Dental advisory given  Plan Discussed with: CRNA  Anesthesia Plan Comments: (PAT note by Antionette Poles, PA-C: 78 year old female follows with cardiology for history of nonischemic cardiomyopathy and  left bundle branch block.  Recent echo 08/14/2022 showed EF 60 to 65%, mild diastolic dysfunction, mild tricuspid regurgitation.  Cath 2018 showed mild nonobstructive CAD.  Molli Hazard, NP.  Noted to be stable at that time, no changes to management, 73-month follow-up recommended.   Follows with oncologist Dr. Myna Hidalgo for history of metastatic renal carcinoma.  She had left nephrectomy in 2001. She developed a left lingular mass, status post VATS procedure. Pathology revealed a recurrent renal cell carcinoma resected in October 2008. She developed relapse disease in 2014 and subsequently underwent a VATS and left upper lobe wedge resection of a tumor done on 05/09/2012.She is status post cryoablation of a right renal neoplasm on August 21, 2018.  Chest CT 01/10/23 showed some increase in mediastinal adenopathy.  She had a subcarinal lymph node measuring 1.2 cm, adjacent lymph node measuring 1.1 cm, right lower lobe pulmonary nodule measuring 1.8 cm.   Recently diagnosed with OSA on 02/13/2023, recommended to use CPAP.   Other pertinent history includes renal insufficiency, GERD, cirrhosis, IDDM2.   CMP and CBC from cancer center 02/08/2023 reviewed, creatinine mildly elevated at 1.55 (appears to be near baseline), other unremarkable.  A1c 7.6 at that time.   Patient will need day of surgery evaluation.   EKG 09/04/2022: Normal sinus rhythm.  Rate 83. Non-specific intra-ventricular conduction block   CT chest 01/10/23: IMPRESSION: 1. Interval increase in size of pulmonary nodule within the posterior-medial right lower lobe now measuring 1.8 cm. Previously 1.3 cm. Findings are concerning for metastatic  disease. 2. Interval increase in size of subcarinal lymph nodes. Concerning for nodal metastasis. 3. Coronary artery calcifications. 4. Cirrhotic morphology of the liver. 5. Aortic Atherosclerosis (ICD10-I70.0) and Emphysema (ICD10-J43.9).   TTE 08/14/2022 (Care Everywhere): Left Ventricle: Systolic  function is normal. EF: 60-65%. Ejection  fraction measured by 3D is 55%,    Left Ventricle: Doppler parameters consistent with mild diastolic  dysfunction and low to normal LA pressure.    Left Ventricle: There is mild concentric hypertrophy.    Tricuspid Valve: There is mild regurgitation.    Cath 07/27/2016:  Mid LAD lesion, 25 %stenosed.  There is moderate left ventricular systolic dysfunction.  LV end diastolic pressure is moderately elevated.  The left ventricular ejection fraction is 35-45% by visual estimate.  Hemodynamic findings consistent with mild pulmonary hypertension.      Nonischemic cardiomyopathy: EF appears more to be 35-45% with apical hypokinesis.  Angiographically normal coronary arteries with a Circumflex dominant system.     Plan:  There is clearly evidence of combined systolic and diastolic heart failure: We will start Lasix. - 40 mg twice a day for 2 days then 40 mm daily until follow-up  Discharge after bedrest     )        Anesthesia Quick Evaluation

## 2023-02-20 ENCOUNTER — Encounter (HOSPITAL_COMMUNITY)
Admission: RE | Disposition: A | Discharge status: Home / Self Care | Payer: Self-pay | Source: Home / Self Care | Attending: Emergency Medicine

## 2023-02-20 ENCOUNTER — Ambulatory Visit (HOSPITAL_COMMUNITY): Payer: Self-pay | Admitting: Physician Assistant

## 2023-02-20 ENCOUNTER — Ambulatory Visit (HOSPITAL_COMMUNITY): Payer: No Typology Code available for payment source

## 2023-02-20 ENCOUNTER — Ambulatory Visit (HOSPITAL_BASED_OUTPATIENT_CLINIC_OR_DEPARTMENT_OTHER): Payer: Self-pay | Admitting: Physician Assistant

## 2023-02-20 ENCOUNTER — Encounter (HOSPITAL_COMMUNITY): Payer: Self-pay | Admitting: Emergency Medicine

## 2023-02-20 ENCOUNTER — Ambulatory Visit (HOSPITAL_COMMUNITY)
Admission: RE | Admit: 2023-02-20 | Discharge: 2023-02-20 | Disposition: A | Discharge status: Home / Self Care | Payer: No Typology Code available for payment source | Attending: Emergency Medicine | Admitting: Emergency Medicine

## 2023-02-20 DIAGNOSIS — I447 Left bundle-branch block, unspecified: Secondary | ICD-10-CM | POA: Insufficient documentation

## 2023-02-20 DIAGNOSIS — R848 Other abnormal findings in specimens from respiratory organs and thorax: Secondary | ICD-10-CM | POA: Diagnosis not present

## 2023-02-20 DIAGNOSIS — R590 Localized enlarged lymph nodes: Secondary | ICD-10-CM | POA: Diagnosis not present

## 2023-02-20 DIAGNOSIS — R911 Solitary pulmonary nodule: Secondary | ICD-10-CM | POA: Diagnosis not present

## 2023-02-20 DIAGNOSIS — I110 Hypertensive heart disease with heart failure: Secondary | ICD-10-CM | POA: Insufficient documentation

## 2023-02-20 DIAGNOSIS — J9811 Atelectasis: Secondary | ICD-10-CM | POA: Diagnosis not present

## 2023-02-20 DIAGNOSIS — Z87891 Personal history of nicotine dependence: Secondary | ICD-10-CM | POA: Insufficient documentation

## 2023-02-20 DIAGNOSIS — C3431 Malignant neoplasm of lower lobe, right bronchus or lung: Secondary | ICD-10-CM | POA: Diagnosis not present

## 2023-02-20 DIAGNOSIS — N289 Disorder of kidney and ureter, unspecified: Secondary | ICD-10-CM | POA: Insufficient documentation

## 2023-02-20 DIAGNOSIS — I2510 Atherosclerotic heart disease of native coronary artery without angina pectoris: Secondary | ICD-10-CM | POA: Insufficient documentation

## 2023-02-20 DIAGNOSIS — Z794 Long term (current) use of insulin: Secondary | ICD-10-CM | POA: Insufficient documentation

## 2023-02-20 DIAGNOSIS — C969 Malignant neoplasm of lymphoid, hematopoietic and related tissue, unspecified: Secondary | ICD-10-CM

## 2023-02-20 DIAGNOSIS — G4733 Obstructive sleep apnea (adult) (pediatric): Secondary | ICD-10-CM | POA: Diagnosis not present

## 2023-02-20 DIAGNOSIS — Z7984 Long term (current) use of oral hypoglycemic drugs: Secondary | ICD-10-CM | POA: Diagnosis not present

## 2023-02-20 DIAGNOSIS — C649 Malignant neoplasm of unspecified kidney, except renal pelvis: Secondary | ICD-10-CM | POA: Diagnosis not present

## 2023-02-20 DIAGNOSIS — E119 Type 2 diabetes mellitus without complications: Secondary | ICD-10-CM | POA: Insufficient documentation

## 2023-02-20 DIAGNOSIS — C771 Secondary and unspecified malignant neoplasm of intrathoracic lymph nodes: Secondary | ICD-10-CM | POA: Insufficient documentation

## 2023-02-20 DIAGNOSIS — M1990 Unspecified osteoarthritis, unspecified site: Secondary | ICD-10-CM | POA: Diagnosis not present

## 2023-02-20 DIAGNOSIS — Z85528 Personal history of other malignant neoplasm of kidney: Secondary | ICD-10-CM | POA: Insufficient documentation

## 2023-02-20 DIAGNOSIS — Z48813 Encounter for surgical aftercare following surgery on the respiratory system: Secondary | ICD-10-CM | POA: Diagnosis not present

## 2023-02-20 DIAGNOSIS — I5040 Unspecified combined systolic (congestive) and diastolic (congestive) heart failure: Secondary | ICD-10-CM | POA: Insufficient documentation

## 2023-02-20 DIAGNOSIS — I428 Other cardiomyopathies: Secondary | ICD-10-CM | POA: Insufficient documentation

## 2023-02-20 DIAGNOSIS — K7460 Unspecified cirrhosis of liver: Secondary | ICD-10-CM | POA: Insufficient documentation

## 2023-02-20 DIAGNOSIS — I5022 Chronic systolic (congestive) heart failure: Secondary | ICD-10-CM | POA: Diagnosis not present

## 2023-02-20 DIAGNOSIS — Z905 Acquired absence of kidney: Secondary | ICD-10-CM | POA: Insufficient documentation

## 2023-02-20 DIAGNOSIS — I10 Essential (primary) hypertension: Secondary | ICD-10-CM | POA: Diagnosis not present

## 2023-02-20 HISTORY — PX: FINE NEEDLE ASPIRATION: SHX5430

## 2023-02-20 HISTORY — PX: BRONCHIAL BRUSHINGS: SHX5108

## 2023-02-20 HISTORY — PX: BRONCHIAL NEEDLE ASPIRATION BIOPSY: SHX5106

## 2023-02-20 HISTORY — DX: Unspecified cirrhosis of liver: K74.60

## 2023-02-20 HISTORY — PX: BRONCHIAL BIOPSY: SHX5109

## 2023-02-20 HISTORY — PX: VIDEO BRONCHOSCOPY WITH ENDOBRONCHIAL ULTRASOUND: SHX6177

## 2023-02-20 LAB — GLUCOSE, CAPILLARY
Glucose-Capillary: 175 mg/dL — ABNORMAL HIGH (ref 70–99)
Glucose-Capillary: 158 mg/dL — ABNORMAL HIGH (ref 70–99)
Glucose-Capillary: 123 mg/dL — ABNORMAL HIGH (ref 70–99)

## 2023-02-20 SURGERY — BRONCHOSCOPY, WITH BIOPSY USING ELECTROMAGNETIC NAVIGATION
Anesthesia: General

## 2023-02-20 MED ORDER — DEXAMETHASONE SODIUM PHOSPHATE 10 MG/ML IJ SOLN
INTRAMUSCULAR | Status: DC | PRN
  Administered 2023-02-20: 10 mg via INTRAVENOUS

## 2023-02-20 MED ORDER — PHENYLEPHRINE 80 MCG/ML (10ML) SYRINGE FOR IV PUSH (FOR BLOOD PRESSURE SUPPORT)
PREFILLED_SYRINGE | INTRAVENOUS | Status: DC | PRN
  Administered 2023-02-20 (×6): 160 ug via INTRAVENOUS

## 2023-02-20 MED ORDER — FENTANYL CITRATE (PF) 100 MCG/2ML IJ SOLN: 25.0000 ug | INTRAMUSCULAR | Status: DC | PRN

## 2023-02-20 MED ORDER — CHLORHEXIDINE GLUCONATE 0.12 % MT SOLN
15.0000 mL | Freq: Once | OROMUCOSAL | Status: AC
  Administered 2023-02-20: 15 mL via OROMUCOSAL
  Filled 2023-02-20: qty 15

## 2023-02-20 MED ORDER — PROPOFOL 500 MG/50ML IV EMUL
INTRAVENOUS | Status: DC | PRN
  Administered 2023-02-20: 60 ug/kg/min via INTRAVENOUS

## 2023-02-20 MED ORDER — FENTANYL CITRATE (PF) 100 MCG/2ML IJ SOLN
INTRAMUSCULAR | Status: AC
  Filled 2023-02-20: qty 2

## 2023-02-20 MED ORDER — ROCURONIUM BROMIDE 10 MG/ML (PF) SYRINGE
PREFILLED_SYRINGE | INTRAVENOUS | Status: DC | PRN
  Administered 2023-02-20: 50 mg via INTRAVENOUS
  Administered 2023-02-20: 10 mg via INTRAVENOUS

## 2023-02-20 MED ORDER — CARVEDILOL 3.125 MG PO TABS: 1.5625 mg | ORAL_TABLET | Freq: Two times a day (BID) | ORAL | Status: DC

## 2023-02-20 MED ORDER — PROPOFOL 10 MG/ML IV BOLUS
INTRAVENOUS | Status: DC | PRN
  Administered 2023-02-20: 100 mg via INTRAVENOUS

## 2023-02-20 MED ORDER — DROPERIDOL 2.5 MG/ML IJ SOLN: 0.6250 mg | Freq: Once | INTRAMUSCULAR | Status: DC | PRN

## 2023-02-20 MED ORDER — ACETAMINOPHEN 10 MG/ML IV SOLN: 1000.0000 mg | Freq: Once | INTRAVENOUS | Status: DC | PRN

## 2023-02-20 MED ORDER — FENTANYL CITRATE (PF) 250 MCG/5ML IJ SOLN
INTRAMUSCULAR | Status: DC | PRN
  Administered 2023-02-20: 50 ug via INTRAVENOUS

## 2023-02-20 MED ORDER — LIDOCAINE 2% (20 MG/ML) 5 ML SYRINGE
INTRAMUSCULAR | Status: DC | PRN
  Administered 2023-02-20: 60 mg via INTRAVENOUS

## 2023-02-20 MED ORDER — LANTUS SOLOSTAR 100 UNIT/ML ~~LOC~~ SOPN: 14.0000 [IU] | PEN_INJECTOR | Freq: Every morning | SUBCUTANEOUS | Status: DC

## 2023-02-20 MED ORDER — LACTATED RINGERS IV SOLN: INTRAVENOUS | Status: DC

## 2023-02-20 MED ORDER — SUGAMMADEX SODIUM 200 MG/2ML IV SOLN
INTRAVENOUS | Status: DC | PRN
  Administered 2023-02-20: 200 mg via INTRAVENOUS

## 2023-02-20 NOTE — Anesthesia Postprocedure Evaluation (Signed)
Anesthesia Post Note  Patient: Amanda Crawford  Procedure(s) Performed: ROBOTIC ASSISTED NAVIGATIONAL BRONCHOSCOPY VIDEO BRONCHOSCOPY WITH ENDOBRONCHIAL ULTRASOUND BRONCHIAL BRUSHINGS BRONCHIAL NEEDLE ASPIRATION BIOPSIES BRONCHIAL BIOPSIES FINE NEEDLE ASPIRATION (FNA) LINEAR     Patient location during evaluation: PACU Anesthesia Type: General Level of consciousness: awake and alert Pain management: pain level controlled Vital Signs Assessment: post-procedure vital signs reviewed and stable Respiratory status: spontaneous breathing, nonlabored ventilation, respiratory function stable and patient connected to nasal cannula oxygen Cardiovascular status: blood pressure returned to baseline and stable Postop Assessment: no apparent nausea or vomiting Anesthetic complications: no   No notable events documented.  Last Vitals:  Vitals:   02/20/23 1245 02/20/23 1300  BP: 110/63 114/66  Pulse: 83 85  Resp: 16 (!) 21  Temp:    SpO2: 91% 90%    Last Pain:  Vitals:   02/20/23 1237  PainSc: 0-No pain                 Dubberly Nation

## 2023-02-20 NOTE — Anesthesia Procedure Notes (Addendum)
Procedure Name: Intubation Date/Time: 02/20/2023 11:23 AM  Performed by: Thomasene Ripple, CRNAPre-anesthesia Checklist: Patient identified, Emergency Drugs available, Suction available and Patient being monitored Patient Re-evaluated:Patient Re-evaluated prior to induction Oxygen Delivery Method: Circle System Utilized Preoxygenation: Pre-oxygenation with 100% oxygen Induction Type: IV induction Ventilation: Mask ventilation without difficulty Laryngoscope Size: Miller and 3 Grade View: Grade I Tube type: Oral Tube size: 8.5 mm Number of attempts: 1 Airway Equipment and Method: Stylet and Oral airway Placement Confirmation: ETT inserted through vocal cords under direct vision, positive ETCO2 and breath sounds checked- equal and bilateral Secured at: 21 cm Tube secured with: Tape Dental Injury: Teeth and Oropharynx as per pre-operative assessment

## 2023-02-20 NOTE — Transfer of Care (Signed)
Immediate Anesthesia Transfer of Care Note  Patient: Amanda Crawford  Procedure(s) Performed: ROBOTIC ASSISTED NAVIGATIONAL BRONCHOSCOPY VIDEO BRONCHOSCOPY WITH ENDOBRONCHIAL ULTRASOUND BRONCHIAL BRUSHINGS BRONCHIAL NEEDLE ASPIRATION BIOPSIES BRONCHIAL BIOPSIES FINE NEEDLE ASPIRATION (FNA) LINEAR  Patient Location: PACU  Anesthesia Type:General  Level of Consciousness: awake, alert , and oriented  Airway & Oxygen Therapy: Patient Spontanous Breathing and Patient connected to face mask oxygen  Post-op Assessment: Report given to RN and Post -op Vital signs reviewed and stable  Post vital signs: Reviewed and stable  Last Vitals:  Vitals Value Taken Time  BP 113/57 02/20/23 1237  Temp    Pulse 84 02/20/23 1242  Resp 17 02/20/23 1242  SpO2 91 % 02/20/23 1242  Vitals shown include unfiled device data.  Last Pain:  Vitals:   02/20/23 0846  PainSc: 0-No pain         Complications: No notable events documented.

## 2023-02-20 NOTE — Discharge Instructions (Addendum)
 Flexible Bronchoscopy, Care After This sheet gives you information about how to care for yourself after your test. Your doctor may also give you more specific instructions. If you have problems or questions, contact your doctor. Follow these instructions at home: Eating and drinking When your numbness is gone and your cough and gag reflexes have come back, you may: Eat only soft foods. Slowly drink liquids. When you get home after the test, go back to your normal diet. Driving Do not drive for 24 hours if you were given a medicine to help you relax (sedative). Do not drive or use heavy machinery while taking prescription pain medicine. General instructions  Take over-the-counter and prescription medicines only as told by your doctor. Return to your normal activities as told. Ask what activities are safe for you. Do not use any products that have nicotine or tobacco in them. This includes cigarettes and e-cigarettes. If you need help quitting, ask your doctor. Keep all follow-up visits as told by your doctor. This is important. It is very important if you had a tissue sample (biopsy) taken. Get help right away if: You have shortness of breath that gets worse. You get light-headed. You feel like you are going to pass out (faint). You have chest pain. You cough up: More than a little blood. More blood than before. Summary Do not eat or drink anything (not even water) for 2 hours after your test, or until your numbing medicine wears off. Do not use cigarettes. Do not use e-cigarettes. Get help right away if you have chest pain.  Please call our office for any questions or concerns.  [phone].  This information is not intended to replace advice given to you by your health care provider. Make sure you discuss any questions you have with your health care provider. Document Released: 11/13/2008 Document Revised: 12/29/2016 Document Reviewed: 02/04/2016 Elsevier Patient Education  2020  [employer].

## 2023-02-20 NOTE — Op Note (Signed)
Video Bronchoscopy with Robotic Assisted Bronchoscopic Navigation and  Endobronchial Ultrasound Procedure Note  Date of Operation: 02/20/2023   Pre-op Diagnosis: Right lower lobe nodule, subcarinal adenopathy  Post-op Diagnosis: Same  Surgeon: Levy Pupa  Assistants: None  Anesthesia: General endotracheal anesthesia  Operation: Flexible video fiberoptic bronchoscopy with robotic assistance and biopsies.  Estimated Blood Loss: Minimal  Complications: None  Indications and History: Amanda Crawford is a 78 y.o. female with history of clear-cell renal cell carcinoma.  She was found to have a new right lower lobe pulmonary nodule and subcarinal adenopathy on surveillance CT imaging.  Recommendation made to achieve a tissue diagnosis via robotic assisted navigational bronchoscopy and endobronchial ultrasound.  The risks, benefits, complications, treatment options and expected outcomes were discussed with the patient.  The possibilities of pneumothorax, pneumonia, reaction to medication, pulmonary aspiration, perforation of a viscus, bleeding, failure to diagnose a condition and creating a complication requiring transfusion or operation were discussed with the patient who freely signed the consent.    Description of Procedure: The patient was seen in the Preoperative Area, was examined and was deemed appropriate to proceed.  The patient was taken to [facility] endoscopy room 3, identified as Alene Mires and the procedure verified as Flexible Video Fiberoptic Bronchoscopy.  A Time Out was held and the above information confirmed.   Prior to the date of the procedure a high-resolution CT scan of the chest was performed. Utilizing ION software program a virtual tracheobronchial tree was generated to allow the creation of distinct navigation pathways to the patient's parenchymal abnormalities. After being taken to the operating room general anesthesia was initiated and the patient  was orally intubated.  The video fiberoptic bronchoscope was introduced via the endotracheal tube and a general inspection was performed which showed globally edematous airways but otherwise normal right and left lung anatomy. Aspiration of the bilateral mainstems was completed to remove any remaining secretions. Robotic catheter inserted into patient's endotracheal tube.   Target #1 right lower lobe pulmonary nodule: The distinct navigation pathways prepared prior to this procedure were then utilized to navigate to patient's lesion identified on CT scan. The robotic catheter was secured into place and the vision probe was withdrawn.  Lesion location was approximated using fluoroscopy.  Local registration and targeting was performed using Cios three-dimensional imaging. Under fluoroscopic guidance transbronchial needle brushings, transbronchial needle biopsies, and transbronchial forceps biopsies were performed to be sent for cytology and pathology.  The robotic scope was then withdrawn and the endobronchial ultrasound was used to identify and characterize the peritracheal, hilar and bronchial lymph nodes. Inspection showed normal-sized node at station 4L.  There was no visible node at station 4R.  There was a significantly enlarged subcarinal node, station 7. Using real-time ultrasound guidance Wang needle biopsies were take from Station 7 nodes and were sent for cytology.    At the end of the procedure a general airway inspection was performed and there was no evidence of active bleeding. The bronchoscope was removed.  The patient tolerated the procedure well. There was no significant blood loss and there were no obvious complications. A post-procedural chest x-ray is pending.  Samples Target #1: 1. Transbronchial needle brushings from right lower lobe nodule 2. Transbronchial Wang needle biopsies from right lower lobe nodule 3. Transbronchial forceps biopsies from right lower lobe nodule  Samples: 1. Wang needle  biopsies from 7 node    Plans:  The patient will be discharged from the PACU to home when recovered from  anesthesia and after chest x-ray is reviewed. We will review the cytology, pathology and microbiology results with the patient when they become available. Outpatient followup will be with Saralyn Pilar, NP and Dr Myna Hidalgo .   Levy Pupa, MD, PhD 02/20/2023, 12:29 PM [facility] [phone] or if no answer before 7:00PM call [phone] For any issues after 7:00PM please call eLink [phone]

## 2023-02-20 NOTE — Interval H&P Note (Signed)
History and Physical Interval Note:  02/20/2023 9:11 AM  Amanda Crawford  has presented today for surgery, with the diagnosis of LUNG NODULE, SUSPICIOUS RENAL CELL CARCINOMA METS.  The various methods of treatment have been discussed with the patient and family. After consideration of risks, benefits and other options for treatment, the patient has consented to  Procedure(s): ROBOTIC ASSISTED NAVIGATIONAL BRONCHOSCOPY (N/A) and ENDOBRONCHIAL ULTRASOUND as a surgical intervention.  The patient's history has been reviewed, patient examined, no change in status, stable for surgery.  I have reviewed the patient's chart and labs.  Questions were answered to the patient's satisfaction.     Leslye Peer

## 2023-02-23 ENCOUNTER — Encounter (HOSPITAL_COMMUNITY): Payer: Self-pay | Admitting: Emergency Medicine

## 2023-02-23 ENCOUNTER — Telehealth: Payer: Self-pay | Admitting: Emergency Medicine

## 2023-02-23 LAB — CYTOLOGY - NON PAP

## 2023-02-23 NOTE — Telephone Encounter (Signed)
Discussed bronchoscopy results with the patient.  Confirmed small cell lung cancer.  She has follow-up in our office next week and will need follow-up with Dr. Myna Hidalgo

## 2023-02-27 ENCOUNTER — Ambulatory Visit: Payer: No Typology Code available for payment source | Admitting: Acute Care

## 2023-03-01 ENCOUNTER — Ambulatory Visit (INDEPENDENT_AMBULATORY_CARE_PROVIDER_SITE_OTHER): Payer: No Typology Code available for payment source | Admitting: Acute Care

## 2023-03-01 ENCOUNTER — Encounter: Payer: Self-pay | Admitting: Acute Care

## 2023-03-01 ENCOUNTER — Other Ambulatory Visit: Payer: Self-pay

## 2023-03-01 VITALS — BP 92/55 | HR 90 | Ht 64.0 in | Wt 215.5 lb

## 2023-03-01 DIAGNOSIS — C7801 Secondary malignant neoplasm of right lung: Secondary | ICD-10-CM

## 2023-03-01 DIAGNOSIS — Z87891 Personal history of nicotine dependence: Secondary | ICD-10-CM

## 2023-03-01 DIAGNOSIS — Z85528 Personal history of other malignant neoplasm of kidney: Secondary | ICD-10-CM | POA: Diagnosis not present

## 2023-03-01 DIAGNOSIS — Z9889 Other specified postprocedural states: Secondary | ICD-10-CM | POA: Diagnosis not present

## 2023-03-01 DIAGNOSIS — R911 Solitary pulmonary nodule: Secondary | ICD-10-CM

## 2023-03-01 NOTE — Progress Notes (Signed)
History of Present Illness Amanda Crawford is a 78 y.o. female former smoker, quit 2008 with past medical history  ,  Renal cell cancer diagnosed in 2000, Recurrent renal cell carcinoma with mets to the lung, Sleep apnea And Prediabetes, she was referred to see Dr. Delton Coombes for consideration of bronchoscopy with biopsies of a new 13 mm right lower lobe lung nodule concerning for recurrent renal cell carcinoma versus new primary lung malignancy.   Synopsis The patient, with a history of renal cell carcinoma, she underwent a nephrectomy in 2001. Subsequent surveillance scans revealed a mass in the left lung in 2008, confirmed as recurrent renal cell carcinoma and resected. Another relapse occurred in 2014, necessitating a VATS and left upper lobe web resection of the tumor. In July 2020, the patient underwent cryoablation of a right renal neoplasm. A surveillance CT scan in October 2020 revealed a new 13mm nodule in the right lower lobe of the lung, necessitating a biopsy.   The patient denies an increased cough but reports shortness of breath with exertion. They have not previously undergone a bronchoscopy. The patient also reports a history of prediabetes, managed with daily medication, and is currently being evaluated for sleep apnea. They take a low-dose aspirin daily, which will be held for two days prior to the upcoming bronchoscopy. The patient denies any known allergies and has not had any issues with anesthesia in the past.   The patient's most recent CT scan in December showed an interval increase in the size of the pulmonary nodule from 1.3 to 1.8cm over two months. The patient denies any weight loss and reports a slight weight gain since their last doctor's visit.    Principle Diagnosis:  78 year old woman with left kidney cancer diagnosed in 2001.  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 He has not required  any systemic treatment.    Secondary diagnosis: Right renal neoplasm diagnosed in July 2020.     Prior Therapy:  She is status post left nephrectomy in 2001.    She developed a left lingular mass, status post VATS procedure.  Pathology revealed a recurrent renal cell carcinoma resected in October 2008.     She developed relapse disease in 2014 and subsequently underwent a VATS and left upper lobe wedge resection of a tumor done on 05/09/2012. Pathology confirmed another relapse.   She is status post cryoablation of a right renal neoplasm on August 21, 2018. She was under surveillance, and  CT scan of the chest/abdomen/pelvis on 11/29/2022 showed  a new 13 mm nodule in the right lower lobe which has increased in size to 1.8 mm on December 2024 follow up imaging.  Pt underwent video bronchoscopy with robotic assisted bronchoscopic navigation and endobronchial ultrasound on February 20, 2023  for tissue diagnosis of the right lower lobe nodule.  She is here today to follow-up post bronchoscopy with biopsies to review cytology and also to ensure she is doing well postprocedure.  03/01/2023 Pt. Presents for follow up after bronchoscopy with biopsies . She states she has done well after the procedure with exception of some body aches, specifically her back. She has not been taking any over the counter medications for this, but she said she will see if that will help. She denies and bleeding, infection, fever, discolored secretions, and no adverse  reaction to anesthesia.  Dr. Delton Coombes called her with the results of her biopsies. She understands  she has small cell lung cancer. She will follow up with Dr. Myna Hidalgo 03/14/2023 , as an established patient.   I have ordered both PET scan and MRI of the brain to complete staging process.  I have asked the patient to try Tylenol and ibuprofen for body aches postprocedure.  I have told her if this does not soon resolve to give [place] a call so that we can reevaluate her at  that time.  She does endorse some shortness of breath with exertion however would prefer not to use rescue inhalers at this present time.  She states when used in the past they gave her terrible headaches.   Test Results: Cytology 02/20/2023 A. LUNG, RLL, FINE NEEDLE ASPIRATION:  - Small cell carcinoma       - See comment   B. LUNG, RLL, BRUSHING:  - Rare atypical cells present    FINAL MICROSCOPIC DIAGNOSIS:  C. LYMPH NODE, 7, FINE NEEDLE ASPIRATION:  - Metastatic carcinoma, consistent with small cell carcinoma    SPECIMEN ADEQUACY:  C. Satisfactory for Evaluation      Latest Ref Rng & Units 02/08/2023   11:10 AM 02/07/2023    2:51 PM 01/01/2023    3:24 PM  CBC  WBC 4.0 - 10.5 K/uL 7.6  8.4  8.1   Hemoglobin 12.0 - 15.0 g/dL 10.6  26.9  48.5   Hematocrit 36.0 - 46.0 % 40.0  39.5  40.0   Platelets 150.0 - 400.0 K/uL 161.0  151  157        Latest Ref Rng & Units 02/08/2023   11:10 AM 02/07/2023    2:51 PM 01/03/2023   10:44 AM  BMP  Glucose 70 - 99 mg/dL 462  703  500   BUN 6 - 23 mg/dL 32  29  37   Creatinine 0.40 - 1.20 mg/dL 9.38  1.82  9.93   Sodium 135 - 145 mEq/L 137  141  139   Potassium 3.5 - 5.1 mEq/L 4.9  5.0  5.4   Chloride 96 - 112 mEq/L 101  103  107   CO2 19 - 32 mEq/L 27  30  25    Calcium 8.4 - 10.5 mg/dL 71.6  96.7  89.3     BNP    Component Value Date/Time   BNP 43.8 07/24/2016 1550    ProBNP    Component Value Date/Time   PROBNP <30.0 01/30/2007 1710    PFT    Component Value Date/Time   FEV1PRE 1.73 01/20/2015 1249   FEV1POST 1.80 01/20/2015 1249   FVCPRE 2.31 01/20/2015 1249   FVCPOST 2.34 01/20/2015 1249   TLC 3.86 01/20/2015 1249   DLCOUNC 14.33 01/20/2015 1249   PREFEV1FVCRT 75 01/20/2015 1249   PSTFEV1FVCRT 77 01/20/2015 1249    DG Chest Port 1 View Result Date: 02/20/2023 CLINICAL DATA:  Status post bronchoscopy with biopsy. EXAM: PORTABLE CHEST 1 VIEW COMPARISON:  CT chest dated January 10, 2023. FINDINGS: The heart size and  mediastinal contours are within normal limits. Postsurgical changes near the left hilum. Normal pulmonary vascularity. Mild right basilar atelectasis. No focal consolidation, pleural effusion, or pneumothorax. No acute osseous abnormality. IMPRESSION: 1. No pneumothorax. Mild right basilar atelectasis. Electronically Signed   By: Obie Dredge M.D.   On: 02/20/2023 13:28   DG C-ARM BRONCHOSCOPY Result Date: 02/20/2023 C-ARM BRONCHOSCOPY: Fluoroscopy was utilized by the requesting physician.  No radiographic interpretation.     Past medical hx Past Medical History:  Diagnosis Date  Allergic state 03/12/2015   Arthritis    Cirrhosis (HCC)    Colon polyp 11/19/2014   Dermatitis 03/12/2015   Diarrhea 06/29/2016   Diverticulitis    DM (diabetes mellitus), type 2 (HCC) 06/29/2016   GERD (gastroesophageal reflux disease)    occ   Gout 03/12/2015   H/O measles    H/O mumps    Headache(784.0)    migraines   History of chicken pox 11/23/2014   Hyperglycemia 06/29/2016   Insomnia 02/13/2023   Lung cancer (HCC)    Migraine 11/23/2014   Obesity 11/23/2014   Pneumonia    child   Preventative health care 09/12/2015   Primary hypertension 12/15/2020   Renal cell cancer (HCC)    renal cell ca dx 9/01 and 8/08;   Renal insufficiency    Shortness of breath    occ   Skin lesion of breast 03/12/2015   Sleep apnea 01/24/2023   recently dx 01/24/23 - CPAP ordered by not received yet     Social History   Tobacco Use   Smoking status: Former    Current packs/day: 0.00    Average packs/day: 0.5 packs/day for 35.0 years (17.5 ttl pk-yrs)    Types: Cigarettes    Start date: 05/08/1971    Quit date: 05/08/2006    Years since quitting: 16.8   Smokeless tobacco: Never  Vaping Use   Vaping status: Never Used  Substance Use Topics   Alcohol use: Yes    Comment: seldomly   Drug use: No    Ms.Stoffers reports that she quit smoking about 16 years ago. Her smoking use included cigarettes. She  started smoking about 51 years ago. She has a 17.5 pack-year smoking history. She has never used smokeless tobacco. She reports current alcohol use. She reports that she does not use drugs.  Tobacco Cessation: Former smoker with a 20-pack-year smoking history, quit 2008   Past surgical hx, Family hx, Social hx all reviewed.  Current Outpatient Medications on File Prior to Visit  Medication Sig   allopurinol (ZYLOPRIM) 300 MG tablet TAKE 1 TABLET BY MOUTH EVERY DAY   aspirin EC 81 MG tablet Take 81 mg by mouth daily.   atorvastatin (LIPITOR) 20 MG tablet Take 1 tablet (20 mg total) by mouth daily.   Calcium Carb-Cholecalciferol (CALCIUM 600 + D PO) Take 1 tablet by mouth daily.   carvedilol (COREG) 3.125 MG tablet Take 0.5 tablets (1.5625 mg total) by mouth 2 (two) times daily with a meal.   diphenhydramine-acetaminophen (TYLENOL PM) 25-500 MG TABS Take 1 tablet by mouth at bedtime as needed (sleep).   furosemide (LASIX) 40 MG tablet TAKE 1 TABLET BY MOUTH EVERY DAY   glimepiride (AMARYL) 2 MG tablet TAKE 1 TABLET BY MOUTH EVERY DAY WITH BREAKFAST   glucose blood (ONETOUCH VERIO) test strip Check Blood Sugar 3 times daily.   hyoscyamine (LEVSIN SL) 0.125 MG SL tablet Place 1 tablet (0.125 mg total) under the tongue every 6 (six) hours as needed.   insulin glargine (LANTUS SOLOSTAR) 100 UNIT/ML Solostar Pen Inject 14 Units into the skin every morning.   Insulin Pen Needle (BD PEN NEEDLE NANO 2ND GEN) 32G X 4 MM MISC USE AS DIRECTED WITH LANTUS ONCE A DAY.   irbesartan (AVAPRO) 75 MG tablet Take 1 tablet (75 mg total) by mouth daily.   Lancets (ONETOUCH DELICA PLUS LANCET33G) MISC USE AS DIRECTED TO TEST DAILY   meclizine (ANTIVERT) 12.5 MG tablet Take 1 tablet (12.5 mg total) by  mouth 3 (three) times daily as needed for dizziness. As needed for   Dizziness or nausea   nystatin cream (MYCOSTATIN) Apply 1 application topically 2 (two) times daily.   ramelteon (ROZEREM) 8 MG tablet Take 1 tablet  (8 mg total) by mouth at bedtime.   Vitamin D, Ergocalciferol, (DRISDOL) 1.25 MG (50000 UNIT) CAPS capsule Take 1 capsule (50,000 Units total) by mouth every 7 (seven) days. TAKE 1 CAPSULE (50,000 UNITS TOTAL) BY MOUTH EVERY 7 (SEVEN) DAYS (Patient taking differently: Take 50,000 Units by mouth every 7 (seven) days. TAKE 1 CAPSULE (50,000 UNITS TOTAL) BY MOUTH EVERY 7 (SEVEN) DAYS - takes on sunday)   zolpidem (AMBIEN) 5 MG tablet Take 1 tablet (5 mg total) by mouth at bedtime as needed for sleep.   [DISCONTINUED] glyBURIDE-metformin (GLUCOVANCE) 1.25-250 MG tablet Take 1 tablet by mouth 2 (two) times daily with a meal.   No current facility-administered medications on file prior to visit.     No Known Allergies  Review Of Systems:  Constitutional:   No  weight loss, night sweats,  Fevers, chills, fatigue, or  lassitude.  HEENT:   No headaches,  Difficulty swallowing,  Tooth/dental problems, or  Sore throat,                No sneezing, itching, ear ache, nasal congestion, post nasal drip,   CV:  No chest pain,  Orthopnea, PND, swelling in lower extremities, anasarca, dizziness, palpitations, syncope.   GI  No heartburn, indigestion, abdominal pain, nausea, vomiting, diarrhea, change in bowel habits, loss of appetite, bloody stools.   Resp: No shortness of breath with exertion or at rest.  No excess mucus, no productive cough,  No non-productive cough,  No coughing up of blood.  No change in color of mucus.  No wheezing.  No chest wall deformity  Skin: no rash or lesions.  GU: no dysuria, change in color of urine, no urgency or frequency.  No flank pain, no hematuria   MS:  + joint pain or swelling.  No decreased range of motion.  + Musculoskeletal pain postprocedure, No back pain.  Psych:  No change in mood or affect. No depression or anxiety.  No memory loss.   Vital Signs BP (!) 92/55 (Cuff Size: Large)   Pulse 90   Ht 5\' 4"  (1.626 m)   Wt 215 lb 8 oz (97.8 kg)   SpO2 94%    BMI 36.99 kg/m    Physical Exam:  General- No distress,  A&Ox3, appropriate ENT: No sinus tenderness, TM clear, pale nasal mucosa, no oral exudate,no post nasal drip, no LAN Cardiac: S1, S2, regular rate and rhythm, no murmur Chest: No wheeze/ rales/ dullness; no accessory muscle use, no nasal flaring, no sternal retractions, diminished per bases Abd.: Soft Non-tender, nondistended, bowel sounds positive,Body mass index is 36.99 kg/m.  Ext: No clubbing cyanosis, edema Neuro:  normal strength, moving all extremities x 4, alert and oriented x 3, appropriate Skin: No rashes, warm and dry, no obvious skin lesions Psych: normal mood and behavior   Assessment/Plan Metastatic carcinoma, to the lung consistent with small cell carcinoma right lower lobe and number 7 lymph node.   Post bronchoscopy with biopsies Former smoker History of renal cell cancer Plan I am glad you have done well after the procedure. Your biopsies did show small cell lung cancer.,  Which is believed to be metastasis from your kidney. You have an appointment with Dr. Myna Hidalgo 03/13/2022 to go over options  for treatment Take Tylenol  and Ibuprofen for pain as directed on the bottle.  Consider using ice and heat alternating to the area on your back where you have some pain. Call if you need [place] sooner. Call if you want an inhaler for rescue  Please contact office for sooner follow up if symptoms do not improve or worsen or seek emergency care    Blood pressure was rechecked at the end of the visit and had improved to 105 systolic.  Patient denied any dizziness or syncope.  I spent 30 minutes dedicated to the care of this patient on the date of this encounter to include pre-visit review of records, face-to-face time with the patient discussing conditions above, post visit ordering of testing, clinical documentation with the electronic health record, making appropriate referrals as documented, and communicating necessary  information to the patient's healthcare team.   Bevelyn Ngo, NP 03/01/2023  10:29 AM

## 2023-03-01 NOTE — Patient Instructions (Addendum)
It is good to see you today. I am glad you have done well after the procedure. Your biopsies did show small cell lung cancer. You have an appointment with Dr. Myna Hidalgo 03/13/2022 to go over options for treatment Take Tylenol  and Ibuprofen for pain as directed on the bottle.  Consider using ice and heat alternating to the area on your back where you have some pain. Call if you need [place] sooner. Call if you want an inhaler for rescue  Please contact office for sooner follow up if symptoms do not improve or worsen or seek emergency care

## 2023-03-02 NOTE — Progress Notes (Signed)
 The proposed treatment discussed in conference is for discussion purpose only and is not a binding recommendation.  The patients have not been physically examined, or presented with their treatment options.  Therefore, final treatment plans cannot be decided.

## 2023-03-04 ENCOUNTER — Encounter: Payer: Self-pay | Admitting: Acute Care

## 2023-03-06 ENCOUNTER — Encounter: Payer: Self-pay | Admitting: *Deleted

## 2023-03-06 NOTE — Progress Notes (Signed)
 Per Dr Timmy, request for [facility] One testing sent on specimen [id number] DOS 02/20/2023.  Notified regarding this patient today. This is an established patient who had been seen for history of renal cancer. Her scans in October showed a pulmonary nodule suspicious for mets. Repeat scan in December showed the nodule had grown. Recent bronch showed new primary diagnosis of Small Cell Lung Cancer. Pulmonary placed orders for MRI brain and PET.   Reached out to their office as scans were still listed as pending. They were able to get patient scheduled for MRI on  03/10/2023 and PET on 03/16/2023. Patient has a follow up appointment with Dr Timmy on 03/14/2023.   Oncology Nurse Navigator Documentation     03/06/2023   11:30 AM  Oncology Nurse Navigator Flowsheets  Abnormal Finding Date 11/29/2022  Confirmed Diagnosis Date 02/20/2023  Diagnosis Status Additional Work Up  Navigator Follow Up Date: 03/14/2023  Navigator Follow Up Reason: New Patient Appointment  Navigator Location [facility]  Navigator Encounter Type Molecular Studies  Patient Visit Type MedOnc  Treatment Phase Pre-Tx/Tx Discussion  Barriers/Navigation Needs Coordination of Care  Interventions Coordination of Care  Acuity Level 2-Minimal Needs (1-2 Barriers Identified)  Coordination of Care Pathology;Other  Time Spent with Patient 30

## 2023-03-10 ENCOUNTER — Ambulatory Visit (HOSPITAL_COMMUNITY)
Admission: RE | Admit: 2023-03-10 | Discharge: 2023-03-10 | Disposition: A | Discharge status: Home / Self Care | Payer: No Typology Code available for payment source | Source: Ambulatory Visit | Attending: Acute Care | Admitting: Acute Care

## 2023-03-10 DIAGNOSIS — I6782 Cerebral ischemia: Secondary | ICD-10-CM | POA: Diagnosis not present

## 2023-03-10 DIAGNOSIS — R911 Solitary pulmonary nodule: Secondary | ICD-10-CM | POA: Diagnosis not present

## 2023-03-10 DIAGNOSIS — G319 Degenerative disease of nervous system, unspecified: Secondary | ICD-10-CM | POA: Diagnosis not present

## 2023-03-10 MED ORDER — GADOBUTROL 1 MMOL/ML IV SOLN
9.0000 mL | Freq: Once | INTRAVENOUS | Status: AC | PRN
  Administered 2023-03-10: 9 mL via INTRAVENOUS

## 2023-03-14 ENCOUNTER — Encounter: Payer: Self-pay | Admitting: *Deleted

## 2023-03-14 ENCOUNTER — Other Ambulatory Visit: Payer: Self-pay | Admitting: Internal Medicine

## 2023-03-14 ENCOUNTER — Inpatient Hospital Stay: Payer: No Typology Code available for payment source | Attending: Hematology & Oncology

## 2023-03-14 ENCOUNTER — Encounter: Payer: Self-pay | Admitting: Hematology & Oncology

## 2023-03-14 ENCOUNTER — Inpatient Hospital Stay: Payer: No Typology Code available for payment source | Admitting: Licensed Clinical Social Worker

## 2023-03-14 ENCOUNTER — Other Ambulatory Visit: Payer: Self-pay | Admitting: *Deleted

## 2023-03-14 ENCOUNTER — Inpatient Hospital Stay (HOSPITAL_BASED_OUTPATIENT_CLINIC_OR_DEPARTMENT_OTHER): Payer: No Typology Code available for payment source | Admitting: Hematology & Oncology

## 2023-03-14 ENCOUNTER — Other Ambulatory Visit: Payer: Self-pay | Admitting: Family Medicine

## 2023-03-14 VITALS — BP 124/59 | HR 87 | Temp 98.5°F | Resp 16 | Ht 64.0 in | Wt 215.0 lb

## 2023-03-14 DIAGNOSIS — M25552 Pain in left hip: Secondary | ICD-10-CM | POA: Insufficient documentation

## 2023-03-14 DIAGNOSIS — Z5111 Encounter for antineoplastic chemotherapy: Secondary | ICD-10-CM | POA: Insufficient documentation

## 2023-03-14 DIAGNOSIS — C3490 Malignant neoplasm of unspecified part of unspecified bronchus or lung: Secondary | ICD-10-CM | POA: Diagnosis not present

## 2023-03-14 DIAGNOSIS — Z79634 Long term (current) use of topoisomerase inhibitor: Secondary | ICD-10-CM | POA: Diagnosis not present

## 2023-03-14 DIAGNOSIS — C642 Malignant neoplasm of left kidney, except renal pelvis: Secondary | ICD-10-CM

## 2023-03-14 DIAGNOSIS — Z905 Acquired absence of kidney: Secondary | ICD-10-CM | POA: Insufficient documentation

## 2023-03-14 DIAGNOSIS — C342 Malignant neoplasm of middle lobe, bronchus or lung: Secondary | ICD-10-CM

## 2023-03-14 DIAGNOSIS — Z79899 Other long term (current) drug therapy: Secondary | ICD-10-CM | POA: Diagnosis not present

## 2023-03-14 DIAGNOSIS — Z79620 Long term (current) use of immunosuppressive biologic: Secondary | ICD-10-CM | POA: Insufficient documentation

## 2023-03-14 DIAGNOSIS — Z5112 Encounter for antineoplastic immunotherapy: Secondary | ICD-10-CM | POA: Diagnosis not present

## 2023-03-14 DIAGNOSIS — Z79630 Long term (current) use of alkylating agent: Secondary | ICD-10-CM | POA: Diagnosis not present

## 2023-03-14 DIAGNOSIS — Z85528 Personal history of other malignant neoplasm of kidney: Secondary | ICD-10-CM | POA: Insufficient documentation

## 2023-03-14 HISTORY — DX: Malignant neoplasm of middle lobe, bronchus or lung: C34.2

## 2023-03-14 LAB — CBC WITH DIFFERENTIAL (CANCER CENTER ONLY)
Abs Immature Granulocytes: 0.02 10*3/uL (ref 0.00–0.07)
Basophils Absolute: 0 10*3/uL (ref 0.0–0.1)
Basophils Relative: 1 %
Eosinophils Absolute: 0.2 10*3/uL (ref 0.0–0.5)
Eosinophils Relative: 3 %
HCT: 38 % (ref 36.0–46.0)
Hemoglobin: 12.7 g/dL (ref 12.0–15.0)
Immature Granulocytes: 0 %
Lymphocytes Relative: 27 %
Lymphs Abs: 1.9 10*3/uL (ref 0.7–4.0)
MCH: 33.8 pg (ref 26.0–34.0)
MCHC: 33.4 g/dL (ref 30.0–36.0)
MCV: 101.1 fL — ABNORMAL HIGH (ref 80.0–100.0)
Monocytes Absolute: 0.5 10*3/uL (ref 0.1–1.0)
Monocytes Relative: 7 %
Neutro Abs: 4.5 10*3/uL (ref 1.7–7.7)
Neutrophils Relative %: 62 %
Platelet Count: 144 10*3/uL — ABNORMAL LOW (ref 150–400)
RBC: 3.76 MIL/uL — ABNORMAL LOW (ref 3.87–5.11)
RDW: 14.3 % (ref 11.5–15.5)
WBC Count: 7.2 10*3/uL (ref 4.0–10.5)
nRBC: 0 % (ref 0.0–0.2)

## 2023-03-14 LAB — CMP (CANCER CENTER ONLY)
ALT: 30 U/L (ref 0–44)
AST: 34 U/L (ref 15–41)
Albumin: 4.3 g/dL (ref 3.5–5.0)
Alkaline Phosphatase: 70 U/L (ref 38–126)
Anion gap: 7 (ref 5–15)
BUN: 45 mg/dL — ABNORMAL HIGH (ref 8–23)
CO2: 27 mmol/L (ref 22–32)
Calcium: 10.9 mg/dL — ABNORMAL HIGH (ref 8.9–10.3)
Chloride: 105 mmol/L (ref 98–111)
Creatinine: 1.46 mg/dL — ABNORMAL HIGH (ref 0.44–1.00)
GFR, Estimated: 36 mL/min — ABNORMAL LOW (ref >60)
Glucose, Bld: 148 mg/dL — ABNORMAL HIGH (ref 70–99)
Potassium: 5.4 mmol/L — ABNORMAL HIGH (ref 3.5–5.1)
Sodium: 139 mmol/L (ref 135–145)
Total Bilirubin: 0.8 mg/dL (ref 0.0–1.2)
Total Protein: 7.2 g/dL (ref 6.5–8.1)

## 2023-03-14 MED ORDER — DEXAMETHASONE 4 MG PO TABS: ORAL_TABLET | ORAL | 1 refills | Status: DC

## 2023-03-14 MED ORDER — PROCHLORPERAZINE MALEATE 10 MG PO TABS: 10.0000 mg | ORAL_TABLET | Freq: Four times a day (QID) | ORAL | 1 refills | Status: DC | PRN

## 2023-03-14 MED ORDER — ONDANSETRON HCL 8 MG PO TABS: 8.0000 mg | ORAL_TABLET | Freq: Three times a day (TID) | ORAL | 1 refills | Status: DC | PRN

## 2023-03-14 NOTE — Progress Notes (Deleted)
Hematology and Oncology Follow Up   Amanda Crawford [id number] 1945/05/17 78 y.o. 03/14/2023 11:45 AM [name], MDBlyth, Bryon Lions, MD   Principle Diagnosis:  78 year old woman with left kidney cancer diagnosed in 2001.  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  He has not required any systemic treatment.     Secondary diagnosis: Right renal neoplasm diagnosed in July 2020.     Prior Therapy:  She is status post left nephrectomy in 2001.   She developed a left lingular mass, status post VATS procedure.  Pathology revealed a recurrent renal cell carcinoma resected in October 2008.    She developed relapse disease in 2014 and subsequently underwent a VATS and left upper lobe wedge resection of a tumor done on 05/09/2012. Pathology confirmed another relapse.  She is status post cryoablation of a right renal neoplasm on August 21, 2018.   Current therapy: Active surveillance.   Interim History: Amanda Crawford is here for a follow-up visit.  Unfortunately, she is yet to have her bronchoscopy.  She has seen Pulmonary Medicine.  They will do a bronchoscopy on 02/20/2023.  She is having no cough.  There is no hoarseness.  She is having no fever.  There is no sweats.  She is having no nausea or vomiting.  She is having no change in bowel or bladder habits.  We did do a CT scan of the chest.  This was done on 01/10/2023.  This did show some increase in mediastinal adenopathy.  She had a subcarinal lymph node measuring 1.2 cm.  There was an adjacent lymph node measuring 1.1 cm.  There is a right lower lobe pulmonary nodule measuring 1.8 cm.    Again, we have to worry that she has metastasis to the lungs from her renal cell carcinoma.  She is supposed to have a colonoscopy.  However, this has been delayed.  Currently, I would have to say that her performance status  is probably ECOG 1.   Wt Readings from Last 3 Encounters:  03/14/23 215 lb (97.5 kg)  03/01/23 215 lb 8 oz (97.8 kg)  02/20/23 212 lb (96.2 kg)     Medications: Reviewed without changes. Current Outpatient Medications  Medication Sig Dispense Refill   allopurinol (ZYLOPRIM) 300 MG tablet TAKE 1 TABLET BY MOUTH EVERY DAY 90 tablet 1   aspirin EC 81 MG tablet Take 81 mg by mouth daily.     atorvastatin (LIPITOR) 20 MG tablet Take 1 tablet (20 mg total) by mouth daily. 90 tablet 3   Calcium Carb-Cholecalciferol (CALCIUM 600 + D PO) Take 1 tablet by mouth daily.     carvedilol (COREG) 3.125 MG tablet Take 0.5 tablets (1.5625 mg total) by mouth 2 (two) times daily with a meal.     diphenhydramine-acetaminophen (TYLENOL PM) 25-500 MG TABS Take 1 tablet by mouth at bedtime as needed (sleep).     furosemide (LASIX) 40 MG tablet TAKE 1 TABLET BY MOUTH EVERY DAY 90 tablet 3   glimepiride (AMARYL) 2 MG tablet TAKE 1 TABLET BY MOUTH EVERY DAY WITH BREAKFAST 90 tablet 1   glucose blood (ONETOUCH VERIO) test strip Check Blood Sugar 3 times daily. 100 strip 4   hyoscyamine (LEVSIN SL) 0.125 MG SL tablet Place 1 tablet (0.125 mg total) under the tongue every 6 (six) hours as needed. 30 tablet 1   insulin glargine (  LANTUS SOLOSTAR) 100 UNIT/ML Solostar Pen Inject 14 Units into the skin every morning.     Insulin Pen Needle (BD PEN NEEDLE NANO 2ND GEN) 32G X 4 MM MISC USE AS DIRECTED WITH LANTUS ONCE A DAY. 100 each 3   irbesartan (AVAPRO) 75 MG tablet Take 1 tablet (75 mg total) by mouth daily. 90 tablet 3   Lancets (ONETOUCH DELICA PLUS LANCET33G) MISC USE AS DIRECTED TO TEST DAILY 100 each 12   meclizine (ANTIVERT) 12.5 MG tablet Take 1 tablet (12.5 mg total) by mouth 3 (three) times daily as needed for dizziness. As needed for   Dizziness or nausea 30 tablet 1   nystatin cream (MYCOSTATIN) Apply 1 application topically 2 (two) times daily. 30 g 1   ramelteon (ROZEREM) 8 MG tablet Take 1 tablet (8 mg  total) by mouth at bedtime. 30 tablet 1   Vitamin D, Ergocalciferol, (DRISDOL) 1.25 MG (50000 UNIT) CAPS capsule Take 1 capsule (50,000 Units total) by mouth every 7 (seven) days. TAKE 1 CAPSULE (50,000 UNITS TOTAL) BY MOUTH EVERY 7 (SEVEN) DAYS (Patient taking differently: Take 50,000 Units by mouth every 7 (seven) days. TAKE 1 CAPSULE (50,000 UNITS TOTAL) BY MOUTH EVERY 7 (SEVEN) DAYS - takes on sunday) 5 capsule 4   zolpidem (AMBIEN) 5 MG tablet Take 1 tablet (5 mg total) by mouth at bedtime as needed for sleep. 30 tablet 2   No current facility-administered medications for this visit.   Physical exam: Blood pressure (!) 124/59, pulse 87, temperature 98.5 F (36.9 C), temperature source Oral, resp. rate 16, height 5\' 4"  (1.626 m), weight 215 lb (97.5 kg), SpO2 94%.  General appearance: Comfortable appearing without any discomfort Head: Normocephalic without any trauma Oropharynx: Mucous membranes are moist and pink without any thrush or ulcers. Eyes: Pupils are equal and round reactive to light. Lymph nodes: No cervical, supraclavicular, inguinal or axillary lymphadenopathy.   Heart:regular rate and rhythm.  S1 and S2 without leg edema. Lung: Clear without any rhonchi or wheezes.  No dullness to percussion. Abdomin: Soft, nontender, nondistended with good bowel sounds.  No hepatosplenomegaly. Musculoskeletal: No joint deformity or effusion.  Full range of motion noted. Neurological: No deficits noted on motor, sensory and deep tendon reflex exam. Skin: No petechial rash or dryness.  Appeared moist.    Lab Results: Lab Results  Component Value Date   WBC 7.2 03/14/2023   HGB 12.7 03/14/2023   HCT 38.0 03/14/2023   MCV 101.1 (H) 03/14/2023   PLT 144 (L) 03/14/2023     Chemistry      Component Value Date/Time   NA 137 02/08/2023 1110   NA 139 08/23/2017 0933   NA 140 11/01/2016 0818   K 4.9 02/08/2023 1110   K 4.4 11/01/2016 0818   CL 101 02/08/2023 1110   CL 107 04/16/2012  0923   CO2 27 02/08/2023 1110   CO2 26 11/01/2016 0818   BUN 32 (H) 02/08/2023 1110   BUN 24 08/23/2017 0933   BUN 17.9 11/01/2016 0818   CREATININE 1.55 (H) 02/08/2023 1110   CREATININE 1.57 (H) 02/07/2023 1451   CREATININE 1.1 11/01/2016 0818   GLU 151 (H) 09/30/2019 1014      Component Value Date/Time   CALCIUM 10.2 02/08/2023 1110   CALCIUM 10.3 11/01/2016 0818   ALKPHOS 70 02/08/2023 1110   ALKPHOS 72 11/01/2016 0818   AST 40 (H) 02/08/2023 1110   AST 32 02/07/2023 1451   AST 75 (H) 11/01/2016 0818  ALT 36 (H) 02/08/2023 1110   ALT 31 02/07/2023 1451   ALT 75 (H) 11/01/2016 0818   BILITOT 1.0 02/08/2023 1110   BILITOT 0.9 02/07/2023 1451   BILITOT 0.78 11/01/2016 0818        Assessment and Plan: Ms. Nunziata is a 78 y.o. female with a history of left stage IV clear cell renal carcinoma(2001) with pulmonary involvement.  She had the metastatic lesion resected.  She is currently on active surveillance without any recent history of active disease. She also has a history of a right kidney tumor s/p ablation therapy. She is followed by IR for this with MRI.   Again, she had this right lower lobe nodule.  The bronchoscopy will be critical.  I know she has had a past use of tobacco use.  As such, this is going to be very helpful with respect to deciding what this nodule or adenopathy might be.  We will plan to get her back about 2 or 3 weeks after she has the bronchoscopy.  By then, we should have biopsy results back and hopefully molecular markers.    Josph Macho, MD 03/14/2023 11:45 AM

## 2023-03-14 NOTE — Progress Notes (Signed)
START ON PATHWAY REGIMEN - Small Cell Lung     A cycle is every 21 days:     Carboplatin      Etoposide   **Always confirm dose/schedule in your pharmacy ordering system**  Patient Characteristics: Newly Diagnosed, Preoperative or Nonsurgical Candidate (Clinical Staging), First Line, Limited Stage, Nonsurgical Candidate Therapeutic Status: Newly Diagnosed, Preoperative or Nonsurgical Candidate (Clinical Staging) AJCC T Category: cT1 AJCC N Category: cN2 AJCC M Category: cM0 AJCC 9 Stage Grouping: Unknown Check here if patient was staged using an edition other than AJCC Staging 9th Edition: false Stage Classification: Limited Surgical Candidacy: Nonsurgical Candidate Intent of Therapy: Curative Intent, Discussed with Patient

## 2023-03-14 NOTE — Progress Notes (Signed)
[facility] Clinical Social Work  Initial Assessment   Amanda Crawford is a 78 y.o. year old female accompanied by daughter and spouse. Clinical Social Work was referred by nurse navigator for assessment of psychosocial needs.   SDOH (Social Determinants of Health) assessments performed: Yes SDOH Interventions    Flowsheet Row Clinical Support from 07/05/2020 in [facility]  SDOH Interventions   Physical Activity Interventions Intervention Not Indicated  [pt has set a goal to increase activity]       SDOH Screenings   Food Insecurity: No Food Insecurity (12/04/2022)  Housing: Low Risk  (12/04/2022)  Transportation Needs: No Transportation Needs (12/04/2022)  Utilities: Not At Risk (12/04/2022)  Alcohol Screen: Low Risk  (07/05/2020)  Depression (PHQ2-9): Low Risk  (12/04/2022)  Financial Resource Strain: Low Risk  (07/05/2020)  Physical Activity: Inactive (07/05/2020)  Social Connections: Unknown (06/06/2021)   Received from [facility]  Stress: No Stress Concern Present (08/12/2022)   Received from [facility]  Tobacco Use: Medium Risk (03/04/2023)     Distress Screen completed: No     No data to display            Family/Social Information:  Housing Arrangement: patient lives with her husband,  Amanda Crawford. Family members/support persons in your life? Family and Friends Transportation concerns: no  Employment: Retired Income source: Actor concerns: No Type of concern: None Food access concerns: no Religious or spiritual practice: Yes-Patient identifies as Paediatric nurse: No Services Currently in place:  Medicare  Coping/ Adjustment to diagnosis: Patient understands treatment plan and what happens next? yes Concerns about diagnosis and/or treatment: I'm not especially worried about anything Patient reported stressors:  None per patient. Hopes and/or priorities: Family Patient  enjoys time with family/ friends Current coping skills/ strengths: Active sense of humor , Capable of independent living , Manufacturing systems engineer , Contractor , General fund of knowledge , and Supportive family/friends     SUMMARY: Current SDOH Barriers:  None  Clinical Social Work Clinical Goal(s):  No clinical social work goals at this time  Interventions: Discussed common feeling and emotions when being diagnosed with cancer, and the importance of support during treatment Informed patient of the support team roles and support services at [facility] Provided CSW contact information and encouraged patient to call with any questions or concerns Provided patient with information about CSW role and the [employer].   Follow Up Plan: Patient will contact CSW with any support or resource needs Patient verbalizes understanding of plan: Yes    Dorothey Baseman, LCSW Clinical Social Worker [facility]

## 2023-03-14 NOTE — Progress Notes (Signed)
Hematology and Oncology Follow Up Visit  Amanda Crawford [id number] 02-21-1945 78 y.o. 03/14/2023   Principle Diagnosis:  Small cell lung cancer -- ?? stage Renal cell carcinoma -- Clear cell  Current Therapy:   S/p LEFT nephrectomy - 2001     Interim History:  Amanda Crawford is back for follow-up.  We saw her back in early January.  At that time, she had a CT of the chest which showed some increase in mediastinal adenopathy.  We subsequently sent her to pulmonary medicine.  They were very thorough and did a bronchoscopy on her.  This was done on 02/20/2023.  Surprisingly, the pathology report ([id number]) showed small cell lung cancer.  I really do not be surprised.  I really do not think this is going to be her renal cell carcinoma since has been over 20 years that she had the renal cell.  At this point time, we need to stage the small cell cancer.  She is due for a PET scan this week.  She had a MRI of the brain last Friday.  The MRI of the brain is being read.  She really has had no symptoms.  She has had no chest pain.  There has been no cough.  She has had no hemoptysis.  She has had no weight loss or weight gain.  She has had no headache.  There is been no change in bowel or bladder habits.  She has had no leg swelling.  She has had no rashes or adenopathy.  Overall, I would have said that her performance status is ECOG 1.  Medications:  Current Outpatient Medications:    allopurinol (ZYLOPRIM) 300 MG tablet, TAKE 1 TABLET BY MOUTH EVERY DAY, Disp: 90 tablet, Rfl: 1   aspirin EC 81 MG tablet, Take 81 mg by mouth daily., Disp: , Rfl:    atorvastatin (LIPITOR) 20 MG tablet, Take 1 tablet (20 mg total) by mouth daily., Disp: 90 tablet, Rfl: 3   Calcium Carb-Cholecalciferol (CALCIUM 600 + D PO), Take 1 tablet by mouth daily., Disp: , Rfl:    carvedilol (COREG) 3.125 MG tablet, Take 0.5 tablets (1.5625 mg total) by mouth 2 (two) times daily with a meal., Disp: , Rfl:     diphenhydramine-acetaminophen (TYLENOL PM) 25-500 MG TABS, Take 1 tablet by mouth at bedtime as needed (sleep)., Disp: , Rfl:    furosemide (LASIX) 40 MG tablet, TAKE 1 TABLET BY MOUTH EVERY DAY, Disp: 90 tablet, Rfl: 3   glimepiride (AMARYL) 2 MG tablet, TAKE 1 TABLET BY MOUTH EVERY DAY WITH BREAKFAST, Disp: 90 tablet, Rfl: 1   glucose blood (ONETOUCH VERIO) test strip, Check Blood Sugar 3 times daily., Disp: 100 strip, Rfl: 4   hyoscyamine (LEVSIN SL) 0.125 MG SL tablet, Place 1 tablet (0.125 mg total) under the tongue every 6 (six) hours as needed., Disp: 30 tablet, Rfl: 1   insulin glargine (LANTUS SOLOSTAR) 100 UNIT/ML Solostar Pen, Inject 14 Units into the skin every morning., Disp: , Rfl:    Insulin Pen Needle (BD PEN NEEDLE NANO 2ND GEN) 32G X 4 MM MISC, USE AS DIRECTED WITH LANTUS ONCE A DAY., Disp: 100 each, Rfl: 3   irbesartan (AVAPRO) 75 MG tablet, Take 1 tablet (75 mg total) by mouth daily., Disp: 90 tablet, Rfl: 3   Lancets (ONETOUCH DELICA PLUS LANCET33G) MISC, USE AS DIRECTED TO TEST DAILY, Disp: 100 each, Rfl: 12   meclizine (ANTIVERT) 12.5 MG tablet, Take 1 tablet (12.5 mg total) by  mouth 3 (three) times daily as needed for dizziness. As needed for   Dizziness or nausea, Disp: 30 tablet, Rfl: 1   nystatin cream (MYCOSTATIN), Apply 1 application topically 2 (two) times daily., Disp: 30 g, Rfl: 1   ramelteon (ROZEREM) 8 MG tablet, Take 1 tablet (8 mg total) by mouth at bedtime., Disp: 30 tablet, Rfl: 1   Vitamin D, Ergocalciferol, (DRISDOL) 1.25 MG (50000 UNIT) CAPS capsule, Take 1 capsule (50,000 Units total) by mouth every 7 (seven) days. TAKE 1 CAPSULE (50,000 UNITS TOTAL) BY MOUTH EVERY 7 (SEVEN) DAYS (Patient taking differently: Take 50,000 Units by mouth every 7 (seven) days. TAKE 1 CAPSULE (50,000 UNITS TOTAL) BY MOUTH EVERY 7 (SEVEN) DAYS - takes on sunday), Disp: 5 capsule, Rfl: 4   zolpidem (AMBIEN) 5 MG tablet, Take 1 tablet (5 mg total) by mouth at bedtime as needed for  sleep., Disp: 30 tablet, Rfl: 2  Allergies: No Known Allergies  Past Medical History, Surgical history, Social history, and Family History were reviewed and updated.  Review of Systems: Review of Systems  Constitutional: Negative.   HENT:  Negative.    Eyes: Negative.   Respiratory: Negative.    Cardiovascular: Negative.   Gastrointestinal: Negative.   Endocrine: Negative.   Genitourinary: Negative.    Musculoskeletal: Negative.   Skin: Negative.   Neurological: Negative.   Hematological: Negative.   Psychiatric/Behavioral: Negative.      Physical Exam:  height is 5\' 4"  (1.626 m) and weight is 215 lb (97.5 kg). Her oral temperature is 98.5 F (36.9 C). Her blood pressure is 124/59 (abnormal) and her pulse is 87. Her respiration is 16 and oxygen saturation is 94%.   Wt Readings from Last 3 Encounters:  03/14/23 215 lb (97.5 kg)  03/01/23 215 lb 8 oz (97.8 kg)  02/20/23 212 lb (96.2 kg)    Physical Exam Vitals reviewed.  HENT:     Head: Normocephalic and atraumatic.  Eyes:     Pupils: Pupils are equal, round, and reactive to light.  Cardiovascular:     Rate and Rhythm: Normal rate and regular rhythm.     Heart sounds: Normal heart sounds.  Pulmonary:     Effort: Pulmonary effort is normal.     Breath sounds: Normal breath sounds.     Comments: Pulmonary exam shows good breath sounds bilaterally.  I hear no wheezing.  She has good air movement bilaterally. Abdominal:     General: Bowel sounds are normal.     Palpations: Abdomen is soft.     Comments: Abdominal exam shows well-healed laparotomy scar left side of the abdomen.  There is no fluid wave.  There is no palpable liver or spleen tip.  Musculoskeletal:        General: No tenderness or deformity. Normal range of motion.     Cervical back: Normal range of motion.  Lymphadenopathy:     Cervical: No cervical adenopathy.  Skin:    General: Skin is warm and dry.     Findings: No erythema or rash.  Neurological:      Mental Status: She is alert and oriented to person, place, and time.  Psychiatric:        Behavior: Behavior normal.        Thought Content: Thought content normal.        Judgment: Judgment normal.      Lab Results  Component Value Date   WBC 7.2 03/14/2023   HGB 12.7 03/14/2023   HCT  38.0 03/14/2023   MCV 101.1 (H) 03/14/2023   PLT 144 (L) 03/14/2023     Chemistry      Component Value Date/Time   NA 139 03/14/2023 1116   NA 139 08/23/2017 0933   NA 140 11/01/2016 0818   K 5.4 (H) 03/14/2023 1116   K 4.4 11/01/2016 0818   CL 105 03/14/2023 1116   CL 107 04/16/2012 0923   CO2 27 03/14/2023 1116   CO2 26 11/01/2016 0818   BUN 45 (H) 03/14/2023 1116   BUN 24 08/23/2017 0933   BUN 17.9 11/01/2016 0818   CREATININE 1.46 (H) 03/14/2023 1116   CREATININE 1.1 11/01/2016 0818   GLU 151 (H) 09/30/2019 1014      Component Value Date/Time   CALCIUM 10.9 (H) 03/14/2023 1116   CALCIUM 10.3 11/01/2016 0818   ALKPHOS 70 03/14/2023 1116   ALKPHOS 72 11/01/2016 0818   AST 34 03/14/2023 1116   AST 75 (H) 11/01/2016 0818   ALT 30 03/14/2023 1116   ALT 75 (H) 11/01/2016 0818   BILITOT 0.8 03/14/2023 1116   BILITOT 0.78 11/01/2016 0818       Impression and Plan: Ms. Lacap is a very charming 78 year old white female.  Surprising, she now has a new malignancy.  This is a small cell lung cancer.  The key now is the staging.  I do not know if this is limited stage or extensive stage.  She had the MRI of the brain last week.  Hopefully, we will get the results back.  She is due for a PET scan this week.  She is going to need to have chemotherapy regardless.  Chronic chemotherapy we use will be dictated by the stage.  She will need to have a Port-A-Cath placed.  I will see about getting a Port-A-Cath put in.  I will then have to see about getting her started on treatment.  I told she and her family what the options were.  If she has limited stage disease, she will get  chemotherapy with radiation therapy.  If this is extensive stage disease, she will have chemotherapy with immunotherapy.  I went over the chemotherapy toxicities.  Again, her renal function will really dictate what chemotherapy we use.  Hopefully, this is limited stage in which we would have a chance of cure.  She is in good shape.  I discussed, I really think we can be aggressive with her.  I think she has her birthday hot coming up on [birth year] 1.  Her daughter and granddaughters want to take her to [place], [place] where she used to live.  We will certainly have to make some adjustments with this.  I do not know if she will need radiotherapy.  Again, the PET scan will show [place] this.   Josph Macho, MD 2/12/20251:23 PM

## 2023-03-14 NOTE — Progress Notes (Signed)
Initial RN Navigator Patient Visit  Name: Amanda Crawford Diagnosis: Small Cell Lung  Patient is an established patient with a history of renal cancer, with new diagnosis of Small Cell Lung. She had an MRI brain which is still pending read, and is scheduled for PET this Friday. She needs to complete staging for firm treatment plan, however we know chemo will be a component.   Dr Myna Hidalgo would like to start chemo on 03/21/2023. Earliest port appointment is also the 19th. After team coordination, will bring patient in on the afternoon of the 19th to start treatment after port placement.   Called and spoke to patient. Reviewed her appointments for chemo education and labs on the 18th. She is aware of time and location.   Reviewed port placement on the 19th. She is aware of 7am arrival, NPO after midnight. Need for driver and supervision. She will use Entrance A. She is also aware to come to the office once discharged from [facility] to initiate treatment.   Referral to nutrition and social work placed per protocol.   Patient understands all follow up procedures and expectations. They have my number to reach out for any further clarification or additional needs.   Oncology Nurse Navigator Documentation     03/14/2023    1:00 PM  Oncology Nurse Navigator Flowsheets  Phase of Treatment Chemotherapy  Navigator Follow Up Date: 03/20/2023  Navigator Follow Up Reason: Chemo Class  Navigator Location [facility]  Navigator Encounter Type Initial MedOnc;Telephone  Telephone Outgoing Call  Patient Visit Type MedOnc  Treatment Phase Pre-Tx/Tx Discussion  Barriers/Navigation Needs Coordination of Therapist, art for Upcoming Surgery/ Treatment;Pain/ Symptom Management  Interventions Coordination of Care;Referrals;Education  Acuity Level 2-Minimal Needs (1-2 Barriers Identified)  Referrals Nutrition/dietician;Social Work  Furniture conservator/restorer of Care Appts;Radiology  Education Method  Verbal;Teach-back  Support Groups/Services Friends and Family  Time Spent with Patient 45  Genetic Counseling Type None

## 2023-03-15 ENCOUNTER — Other Ambulatory Visit: Payer: Self-pay

## 2023-03-16 ENCOUNTER — Ambulatory Visit (HOSPITAL_COMMUNITY)
Admission: RE | Admit: 2023-03-16 | Discharge: 2023-03-16 | Disposition: A | Discharge status: Home / Self Care | Payer: No Typology Code available for payment source | Source: Ambulatory Visit | Attending: Acute Care | Admitting: Acute Care

## 2023-03-16 DIAGNOSIS — R911 Solitary pulmonary nodule: Secondary | ICD-10-CM | POA: Insufficient documentation

## 2023-03-16 DIAGNOSIS — C342 Malignant neoplasm of middle lobe, bronchus or lung: Secondary | ICD-10-CM | POA: Diagnosis not present

## 2023-03-16 DIAGNOSIS — C642 Malignant neoplasm of left kidney, except renal pelvis: Secondary | ICD-10-CM | POA: Diagnosis not present

## 2023-03-16 LAB — GLUCOSE, CAPILLARY: Glucose-Capillary: 144 mg/dL — ABNORMAL HIGH (ref 70–99)

## 2023-03-16 MED ORDER — FLUDEOXYGLUCOSE F - 18 (FDG) INJECTION
10.7300 | Freq: Once | INTRAVENOUS | Status: AC
  Administered 2023-03-16: 10.73 via INTRAVENOUS

## 2023-03-20 ENCOUNTER — Encounter: Payer: Self-pay | Admitting: *Deleted

## 2023-03-20 ENCOUNTER — Inpatient Hospital Stay (HOSPITAL_BASED_OUTPATIENT_CLINIC_OR_DEPARTMENT_OTHER): Payer: No Typology Code available for payment source | Admitting: Hematology & Oncology

## 2023-03-20 ENCOUNTER — Other Ambulatory Visit: Payer: No Typology Code available for payment source

## 2023-03-20 ENCOUNTER — Inpatient Hospital Stay: Payer: No Typology Code available for payment source

## 2023-03-20 ENCOUNTER — Encounter: Payer: Self-pay | Admitting: Hematology & Oncology

## 2023-03-20 ENCOUNTER — Telehealth: Payer: Self-pay

## 2023-03-20 DIAGNOSIS — Z5112 Encounter for antineoplastic immunotherapy: Secondary | ICD-10-CM | POA: Diagnosis not present

## 2023-03-20 DIAGNOSIS — D4989 Neoplasm of unspecified behavior of other specified sites: Secondary | ICD-10-CM

## 2023-03-20 DIAGNOSIS — C342 Malignant neoplasm of middle lobe, bronchus or lung: Secondary | ICD-10-CM | POA: Diagnosis not present

## 2023-03-20 DIAGNOSIS — C3490 Malignant neoplasm of unspecified part of unspecified bronchus or lung: Secondary | ICD-10-CM | POA: Diagnosis not present

## 2023-03-20 LAB — CMP (CANCER CENTER ONLY)
ALT: 26 U/L (ref 0–44)
AST: 27 U/L (ref 15–41)
Albumin: 4.3 g/dL (ref 3.5–5.0)
Alkaline Phosphatase: 75 U/L (ref 38–126)
Anion gap: 8 (ref 5–15)
BUN: 46 mg/dL — ABNORMAL HIGH (ref 8–23)
CO2: 25 mmol/L (ref 22–32)
Calcium: 10.9 mg/dL — ABNORMAL HIGH (ref 8.9–10.3)
Chloride: 101 mmol/L (ref 98–111)
Creatinine: 1.54 mg/dL — ABNORMAL HIGH (ref 0.44–1.00)
GFR, Estimated: 34 mL/min — ABNORMAL LOW (ref >60)
Glucose, Bld: 138 mg/dL — ABNORMAL HIGH (ref 70–99)
Potassium: 5.3 mmol/L — ABNORMAL HIGH (ref 3.5–5.1)
Sodium: 134 mmol/L — ABNORMAL LOW (ref 135–145)
Total Bilirubin: 1 mg/dL (ref 0.0–1.2)
Total Protein: 7 g/dL (ref 6.5–8.1)

## 2023-03-20 LAB — CBC WITH DIFFERENTIAL (CANCER CENTER ONLY)
Abs Immature Granulocytes: 0.03 10*3/uL (ref 0.00–0.07)
Basophils Absolute: 0 10*3/uL (ref 0.0–0.1)
Basophils Relative: 0 %
Eosinophils Absolute: 0.3 10*3/uL (ref 0.0–0.5)
Eosinophils Relative: 3 %
HCT: 38.5 % (ref 36.0–46.0)
Hemoglobin: 12.6 g/dL (ref 12.0–15.0)
Immature Granulocytes: 0 %
Lymphocytes Relative: 26 %
Lymphs Abs: 2.2 10*3/uL (ref 0.7–4.0)
MCH: 32.9 pg (ref 26.0–34.0)
MCHC: 32.7 g/dL (ref 30.0–36.0)
MCV: 100.5 fL — ABNORMAL HIGH (ref 80.0–100.0)
Monocytes Absolute: 0.6 10*3/uL (ref 0.1–1.0)
Monocytes Relative: 7 %
Neutro Abs: 5.3 10*3/uL (ref 1.7–7.7)
Neutrophils Relative %: 64 %
Platelet Count: 172 10*3/uL (ref 150–400)
RBC: 3.83 MIL/uL — ABNORMAL LOW (ref 3.87–5.11)
RDW: 14.4 % (ref 11.5–15.5)
WBC Count: 8.5 10*3/uL (ref 4.0–10.5)
nRBC: 0 % (ref 0.0–0.2)
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
## 2023-03-20 NOTE — Telephone Encounter (Signed)
Sarah,   Patient's PET and MRI have resulted. Call report for MRI below from Diane at [facility].   IMPRESSION: 1. 6 mm enhancing dural-based mass within the posterior fossa on the right, as described. This is favored to reflect a meningioma. However, a dural-based metastasis cannot be excluded. A short-interval follow-up brain MRI (with and without contrast) is recommended 2-3 months. 2. No enhancing parenchymal lesions to suggest metastatic disease to the brain itself. 3. Mild-to-moderate chronic small vessel ischemic changes within the cerebral white matter. 4. Small chronic infarct within the right cerebellar hemisphere. 5. Mild generalized parenchymal atrophy.  Revonda Standard

## 2023-03-20 NOTE — Progress Notes (Signed)
Patient in chemotherapy education class with daughter Rosey Bath and husband [name].  Discussed side effects of Carboplatin, Etoposide  which include but are not limited to myelosuppression, decreased appetite, fatigue, fever, allergic or infusional reaction, mucositis, cardiac toxicity, cough, SOB, altered taste, nausea and vomiting, diarrhea, constipation, elevated LFTs myalgia and arthralgias, hair loss or thinning, rash, skin dryness, nail changes, peripheral neuropathy,  delayed wound healing, mental changes (Chemo brain), increased risk of infections, weight loss.  Reviewed infusion room and office policy and procedure and phone numbers 24 hours x 7 days a week.  Reviewed when to call the office with any concerns or problems.  Transport planner given.  Discussed portacath insertion and EMLA cream administration.  Antiemetic protocol and chemotherapy schedule reviewed. Patient verbalized understanding of chemotherapy indications and possible side effects.  Teachback done

## 2023-03-21 ENCOUNTER — Other Ambulatory Visit: Payer: Self-pay | Admitting: Hematology & Oncology

## 2023-03-21 ENCOUNTER — Ambulatory Visit (HOSPITAL_COMMUNITY)
Admission: RE | Admit: 2023-03-21 | Discharge: 2023-03-21 | Disposition: A | Discharge status: Home / Self Care | Payer: No Typology Code available for payment source | Source: Ambulatory Visit | Attending: Hematology & Oncology | Admitting: Hematology & Oncology

## 2023-03-21 ENCOUNTER — Other Ambulatory Visit: Payer: Self-pay

## 2023-03-21 ENCOUNTER — Inpatient Hospital Stay: Payer: No Typology Code available for payment source

## 2023-03-21 ENCOUNTER — Encounter (HOSPITAL_COMMUNITY): Payer: Self-pay

## 2023-03-21 ENCOUNTER — Encounter: Payer: Self-pay | Admitting: Hematology & Oncology

## 2023-03-21 DIAGNOSIS — Z800 Family history of malignant neoplasm of digestive organs: Secondary | ICD-10-CM | POA: Diagnosis not present

## 2023-03-21 DIAGNOSIS — C342 Malignant neoplasm of middle lobe, bronchus or lung: Secondary | ICD-10-CM | POA: Diagnosis not present

## 2023-03-21 DIAGNOSIS — C3490 Malignant neoplasm of unspecified part of unspecified bronchus or lung: Secondary | ICD-10-CM | POA: Diagnosis not present

## 2023-03-21 DIAGNOSIS — Z87891 Personal history of nicotine dependence: Secondary | ICD-10-CM | POA: Diagnosis not present

## 2023-03-21 DIAGNOSIS — Z85528 Personal history of other malignant neoplasm of kidney: Secondary | ICD-10-CM | POA: Diagnosis not present

## 2023-03-21 HISTORY — PX: IR IMAGING GUIDED PORT INSERTION: IMG5740

## 2023-03-21 LAB — GLUCOSE, CAPILLARY: Glucose-Capillary: 154 mg/dL — ABNORMAL HIGH (ref 70–99)

## 2023-03-21 MED ORDER — MIDAZOLAM HCL 2 MG/2ML IJ SOLN
INTRAMUSCULAR | Status: AC
  Filled 2023-03-21: qty 2

## 2023-03-21 MED ORDER — FENTANYL CITRATE (PF) 100 MCG/2ML IJ SOLN
INTRAMUSCULAR | Status: AC | PRN
  Administered 2023-03-21: 50 ug via INTRAVENOUS

## 2023-03-21 MED ORDER — LIDOCAINE-EPINEPHRINE 1 %-1:100000 IJ SOLN
INTRAMUSCULAR | Status: AC
  Filled 2023-03-21: qty 1

## 2023-03-21 MED ORDER — HEPARIN SOD (PORK) LOCK FLUSH 100 UNIT/ML IV SOLN
INTRAVENOUS | Status: AC
  Filled 2023-03-21: qty 5

## 2023-03-21 MED ORDER — FENTANYL CITRATE (PF) 100 MCG/2ML IJ SOLN
INTRAMUSCULAR | Status: AC
  Filled 2023-03-21: qty 2

## 2023-03-21 MED ORDER — HEPARIN SOD (PORK) LOCK FLUSH 100 UNIT/ML IV SOLN
500.0000 [IU] | Freq: Once | INTRAVENOUS | Status: AC
  Administered 2023-03-21: 500 [IU] via INTRAVENOUS

## 2023-03-21 MED ORDER — LIDOCAINE-EPINEPHRINE 1 %-1:100000 IJ SOLN
20.0000 mL | Freq: Once | INTRAMUSCULAR | Status: AC
  Administered 2023-03-21: 20 mL via INTRADERMAL

## 2023-03-21 MED ORDER — MIDAZOLAM HCL 2 MG/2ML IJ SOLN
INTRAMUSCULAR | Status: AC | PRN
  Administered 2023-03-21: 1 mg via INTRAVENOUS

## 2023-03-21 NOTE — Procedures (Signed)
 Interventional Radiology Procedure Note  Procedure: Single Lumen Power Port Placement    Access:  Right internal jugular vein  Findings: Catheter tip positioned at cavoatrial junction. Port is ready for immediate use.   Complications: None  EBL: < 10 mL  Recommendations:  - Ok to shower in 24 hours - Do not submerge for 7 days - Routine line care    Marliss Coots, MD

## 2023-03-21 NOTE — H&P (Signed)
Chief Complaint: Patient was seen in consultation today for small cell lung cancer  Referring Physician(s): Ennever,Peter R  Supervising Physician: Marliss Coots  Patient Status: [facility] - Out-pt  History of Present Illness: Amanda Crawford is a 78 y.o. female with past medical history of DM, GERD, renal cell carcinoma recently noted to have mediastinal lymphadenopathy.  She underwent diagnostic bronchoscopy which returned positive for small cell lung cancer.  She now has plans for upcoming chemotherapy.  Port-A-Cath requested by Dr. Myna Hidalgo.   Patient presents in her usual state of health today.  She has been NPO. She does not take blood thinners.  Her husband is available for care and transportation post-procedure.    Patient is a FULL CODE.   Past Medical History:  Diagnosis Date   Allergic state 03/12/2015   Arthritis    Cirrhosis (HCC)    Colon polyp 11/19/2014   Dermatitis 03/12/2015   Diarrhea 06/29/2016   Diverticulitis    DM (diabetes mellitus), type 2 (HCC) 06/29/2016   GERD (gastroesophageal reflux disease)    occ   Gout 03/12/2015   H/O measles    H/O mumps    Headache(784.0)    migraines   History of chicken pox 11/23/2014   Hyperglycemia 06/29/2016   Insomnia 02/13/2023   Lung cancer (HCC)    Migraine 11/23/2014   Obesity 11/23/2014   Pneumonia    child   Preventative health care 09/12/2015   Primary hypertension 12/15/2020   Renal cell cancer (HCC)    renal cell ca dx 9/01 and 8/08;   Renal insufficiency    Shortness of breath    occ   Skin lesion of breast 03/12/2015   Sleep apnea 01/24/2023   recently dx 01/24/23 - CPAP ordered by not received yet   Small cell carcinoma of middle lobe of right lung (HCC) 03/14/2023   Small cell lung cancer, right middle lobe (HCC) 03/14/2023    Past Surgical History:  Procedure Laterality Date   ABDOMINAL HYSTERECTOMY     ANAL RECTAL MANOMETRY N/A 06/29/2021   Procedure: ANO RECTAL MANOMETRY;  Surgeon:  Shellia Cleverly, DO;  Location: [facility];  Service: Gastroenterology;  Laterality: N/A;   APPENDECTOMY     BRONCHIAL BIOPSY  02/20/2023   Procedure: BRONCHIAL BIOPSIES;  Surgeon: Leslye Peer, MD;  Location: [facility];  Service: Pulmonary;;   BRONCHIAL BRUSHINGS  02/20/2023   Procedure: BRONCHIAL BRUSHINGS;  Surgeon: Leslye Peer, MD;  Location: [facility] END[facility]COPY;  Service: Pulmonary;;   BRONCHIAL NEEDLE ASPIRATION BIOPSY  02/20/2023   Procedure: BRONCHIAL NEEDLE ASPIRATION BIOPSIES;  Surgeon: Leslye Peer, MD;  Location: [facility];  Service: Pulmonary;;   CARDIAC CATHETERIZATION     yrs ago neg   CHOLECYSTECTOMY     COLONOSCOPY  2018   ESOPHAGOGASTRODUODENOSCOPY     years ago   FINE NEEDLE ASPIRATION  02/20/2023   Procedure: FINE NEEDLE ASPIRATION (FNA) LINEAR;  Surgeon: Leslye Peer, MD;  Location: [facility];  Service: Pulmonary;;   IR RADIOLOGIST EVAL & MGMT  07/17/2018   IR RADIOLOGIST EVAL & MGMT  09/12/2018   IR RADIOLOGIST EVAL & MGMT  06/18/2019   IR RADIOLOGIST EVAL & MGMT  01/13/2020   IR RADIOLOGIST EVAL & MGMT  07/08/2020   IR RADIOLOGIST EVAL & MGMT  10/12/2020   KIDNEY SURGERY  2001   Removed of left kidney    LUNG CANCER SURGERY Left 2008   RADIOLOGY WITH ANESTHESIA N/A 08/21/2018   Procedure: CT  WITH ANESTHESIA RENAL CRYOABLATION;  Surgeon: Oley Balm, MD;  Location: [facility];  Service: Radiology;  Laterality: N/A;   RENAL MASS EXCISION Left 2001   RIGHT/LEFT HEART CATH AND CORONARY ANGIOGRAPHY N/A 07/27/2016   Procedure: Right/Left Heart Cath and Coronary Angiography;  Surgeon: Marykay Lex, MD;  Location: [facility] INVASIVE CV LAB;  Service: Cardiovascular;  Laterality: N/A;   THORACOTOMY Left 05/09/2012   Procedure: THORACOTOMY MAJOR;  Surgeon: Alleen Borne, MD;  Location: [facility] OR;  Service: Thoracic;  Laterality: Left;   VIDEO BRONCHOSCOPY N/A 05/09/2012   Procedure: VIDEO BRONCHOSCOPY;  Surgeon: Alleen Borne, MD;  Location: [facility] OR;  Service:  Thoracic;  Laterality: N/A;   VIDEO BRONCHOSCOPY WITH ENDOBRONCHIAL ULTRASOUND  02/20/2023   Procedure: VIDEO BRONCHOSCOPY WITH ENDOBRONCHIAL ULTRASOUND;  Surgeon: Leslye Peer, MD;  Location: [facility];  Service: Pulmonary;;   WEDGE RESECTION Left 05/09/2012   Procedure: LEFT UPPER LOBE WEDGE RESECTION;  Surgeon: Alleen Borne, MD;  Location: [facility] OR;  Service: Thoracic;  Laterality: Left;    Allergies: Patient has no known allergies.  Medications: Prior to Admission medications   Medication Sig Start Date End Date Taking? Authorizing Provider  allopurinol (ZYLOPRIM) 300 MG tablet TAKE 1 TABLET BY MOUTH EVERY DAY 03/15/23  Yes [name], MD  atorvastatin (LIPITOR) 20 MG tablet Take 1 tablet (20 mg total) by mouth daily. 06/22/22  Yes [name], MD  Calcium Carb-Cholecalciferol (CALCIUM 600 + D PO) Take 1 tablet by mouth daily.   Yes [provider]  carvedilol (COREG) 3.125 MG tablet Take 0.5 tablets (1.5625 mg total) by mouth 2 (two) times daily with a meal. 02/20/23  Yes Byrum, Les Pou, MD  furosemide (LASIX) 40 MG tablet TAKE 1 TABLET BY MOUTH EVERY DAY 03/14/23  Yes Hilty, Lisette Abu, MD  glimepiride (AMARYL) 2 MG tablet TAKE 1 TABLET BY MOUTH EVERY DAY WITH BREAKFAST 02/08/23  Yes [name], MD  insulin glargine (LANTUS SOLOSTAR) 100 UNIT/ML Solostar Pen Inject 14 Units into the skin every morning. 02/20/23  Yes Byrum, Les Pou, MD  irbesartan (AVAPRO) 75 MG tablet Take 1 tablet (75 mg total) by mouth daily. 04/25/22  Yes [name], MD  Vitamin D, Ergocalciferol, (DRISDOL) 1.25 MG (50000 UNIT) CAPS capsule Take 1 capsule (50,000 Units total) by mouth every 7 (seven) days. TAKE 1 CAPSULE (50,000 UNITS TOTAL) BY MOUTH EVERY 7 (SEVEN) DAYS Patient taking differently: Take 50,000 Units by mouth every 7 (seven) days. TAKE 1 CAPSULE (50,000 UNITS TOTAL) BY MOUTH EVERY 7 (SEVEN) DAYS - takes on sunday 10/17/22  Yes [name], MD  zolpidem (AMBIEN) 5 MG tablet  Take 1 tablet (5 mg total) by mouth at bedtime as needed for sleep. 02/13/23  Yes Olalere, Adewale A, MD  aspirin EC 81 MG tablet Take 81 mg by mouth daily.    [provider]  dexamethasone (DECADRON) 4 MG tablet Take 2 tablets daily for 2 days, on days 4 and 5.Take with food. Every 21 days. 03/14/23   Josph Macho, MD  diphenhydramine-acetaminophen (TYLENOL PM) 25-500 MG TABS Take 1 tablet by mouth at bedtime as needed (sleep).    [provider]  glucose blood (ONETOUCH VERIO) test strip Check Blood Sugar 3 times daily. 06/08/22   [name], MD  hyoscyamine (LEVSIN SL) 0.125 MG SL tablet Place 1 tablet (0.125 mg total) under the tongue every 6 (six) hours as needed. 10/14/20   [name], MD  Insulin Pen Needle (BD PEN NEEDLE NANO 2ND GEN) 32G X 4 MM MISC USE AS DIRECTED WITH LANTUS ONCE A DAY. 11/20/22   [name], MD  Lancets [facility] DELICA PLUS LANCET33G) MISC USE AS DIRECTED TO TEST DAILY 09/11/22   [name], MD  meclizine (ANTIVERT) 12.5 MG tablet Take 1 tablet (12.5 mg total) by mouth 3 (three) times daily as needed for dizziness. As needed for   Dizziness or nausea 11/23/14   [name], MD  nystatin cream (MYCOSTATIN) Apply 1 application topically 2 (two) times daily. 10/18/20   [name], MD  ondansetron (ZOFRAN) 8 MG tablet Take 1 tablet (8 mg total) by mouth every 8 (eight) hours as needed for nausea or vomiting. Start on third day after chemotherapy. 03/14/23   Josph Macho, MD  prochlorperazine (COMPAZINE) 10 MG tablet Take 1 tablet (10 mg total) by mouth every 6 (six) hours as needed for nausea or vomiting (Nausea or vomiting). 03/14/23   Josph Macho, MD  ramelteon (ROZEREM) 8 MG tablet Take 1 tablet (8 mg total) by mouth at bedtime. 11/28/22   Tomma Lightning, MD  glyBURIDE-metformin (GLUCOVANCE) 1.25-250 MG tablet Take 1 tablet by mouth 2 (two) times daily with a meal. 03/25/19 03/25/19  [name], MD     Family  History  Problem Relation Age of Onset   Dementia Mother    Heart failure Father    Diabetes Father    Hyperlipidemia Father    Heart disease Father    Hypertension Father    Asthma Brother    Asthma Daughter    Alcohol abuse Maternal Aunt    Colon cancer Paternal Aunt    Cancer Paternal Grandmother    Hearing loss Paternal Grandfather    Stroke Paternal Grandfather     Social History   Socioeconomic History   Marital status: Married    Spouse name: Not on file   Number of children: Not on file   Years of education: Not on file   Highest education level: Not on file  Occupational History   Occupation: retired  Tobacco Use   Smoking status: Former    Current packs/day: 0.00    Average packs/day: 0.5 packs/day for 35.0 years (17.5 ttl pk-yrs)    Types: Cigarettes    Start date: 05/08/1971    Quit date: 05/08/2006    Years since quitting: 16.8   Smokeless tobacco: Never  Vaping Use   Vaping status: Never Used  Substance and Sexual Activity   Alcohol use: Yes    Comment: seldomly   Drug use: No   Sexual activity: Not Currently    Comment: lives with husband, no dietary restrictions.   Other Topics Concern   Not on file  Social History Narrative   Not on file   Social Drivers of Health   Financial Resource Strain: Low Risk  (07/05/2020)   Overall Financial Resource Strain (CARDIA)    Difficulty of Paying Living Expenses: Not hard at all  Food Insecurity: No Food Insecurity (12/04/2022)   Hunger Vital Sign    Worried About Running Out of Food in the Last Year: Never true    Ran Out of Food in the Last Year: Never true  Transportation Needs: No Transportation Needs (12/04/2022)   PRAPARE - Administrator, Civil Service (Medical): No    Lack of Transportation (Non-Medical): No  Physical Activity: Inactive (07/05/2020)   Exercise Vital Sign  Days of Exercise per Week: 0 days    Minutes of Exercise per Session: 0 min  Stress: No Stress Concern Present  (08/12/2022)   Received from [facility] - Occupational Stress Questionnaire    Feeling of Stress : Not at all  Social Connections: Unknown (06/06/2021)   Received from [facility]   Social Network    Social Network: Not on file     Review of Systems: A 12 point ROS discussed and pertinent positives are indicated in the HPI above.  All other systems are negative.  Review of Systems  Constitutional:  Negative for fatigue and fever.  Respiratory:  Negative for cough and shortness of breath.   Cardiovascular:  Negative for chest pain.  Gastrointestinal:  Negative for abdominal pain, nausea and vomiting.  Musculoskeletal:  Negative for back pain.  Psychiatric/Behavioral:  Negative for behavioral problems and confusion.     Vital Signs: BP 117/69   Pulse 79   Temp 97.8 F (36.6 C) (Oral)   Resp 18   Ht 5\' 4"  (1.626 m)   Wt 212 lb (96.2 kg)   SpO2 94%   BMI 36.39 kg/m   Physical Exam Vitals and nursing note reviewed.  Constitutional:      General: She is not in acute distress.    Appearance: Normal appearance. She is not ill-appearing.  HENT:     Mouth/Throat:     Mouth: Mucous membranes are moist.     Pharynx: Oropharynx is clear.  Cardiovascular:     Rate and Rhythm: Normal rate and regular rhythm.     Pulses: Normal pulses.  Pulmonary:     Effort: Pulmonary effort is normal.     Breath sounds: Normal breath sounds.  Abdominal:     General: Abdomen is flat. There is no distension.     Palpations: Abdomen is soft.  Skin:    General: Skin is warm and dry.  Neurological:     General: No focal deficit present.     Mental Status: She is alert and oriented to person, place, and time. Mental status is at baseline.  Psychiatric:        Mood and Affect: Mood normal.        Behavior: Behavior normal.        Thought Content: Thought content normal.        Judgment: Judgment normal.      MD  Evaluation Airway: WNL Heart: WNL Abdomen: WNL Chest/ Lungs: WNL ASA  Classification: 3 Mallampati/Airway Score: Two   Imaging: MR BRAIN W WO CONTRAST Result Date: 03/20/2023 CLINICAL DATA:  Provided history: Lung nodule/nodules. Additional history provided: Small-cell lung cancer staging. EXAM: MRI HEAD WITHOUT AND WITH CONTRAST TECHNIQUE: Multiplanar, multiecho pulse sequences of the brain and surrounding structures were obtained without and with intravenous contrast. CONTRAST:  9mL GADAVIST GADOBUTROL 1 MMOL/ML IV SOLN COMPARISON:  None. FINDINGS: Brain: Mild generalized parenchymal atrophy. 6 mm enhancing dural-based mass within the posterior fossa on the right (overlying the lateral aspect of the right cerebellar hemisphere)(series 16, image 50). Multifocal T2 FLAIR hyperintense signal abnormality within the cerebral white matter, nonspecific but compatible with mild-to-moderate chronic small vessel ischemic disease. Small chronic infarct within the right cerebellar hemisphere. No cortical encephalomalacia is identified. There is no acute infarct. No chronic intracranial blood products. No extra-axial fluid collection. No midline shift. No pathologic parenchymal enhancement. Vascular: Maintained flow voids within the proximal large arterial vessels. Skull and upper  cervical spine: No focal worrisome marrow lesion. Incompletely assessed cervical spondylosis. Slight C2-C3 grade 1 retrolisthesis. Slight C4-C5 grade 1 anterolisthesis. Sinuses/Orbits: No mass or acute finding within the imaged orbits. No significant paranasal sinus disease. Impression #1 will be called to the ordering clinician or representative by the Radiologist Assistant, and communication documented in the PACS or [employer]. IMPRESSION: 1. 6 mm enhancing dural-based mass within the posterior fossa on the right, as described. This is favored to reflect a meningioma. However, a dural-based metastasis cannot be excluded. A  short-interval follow-up brain MRI (with and without contrast) is recommended 2-3 months. 2. No enhancing parenchymal lesions to suggest metastatic disease to the brain itself. 3. Mild-to-moderate chronic small vessel ischemic changes within the cerebral white matter. 4. Small chronic infarct within the right cerebellar hemisphere. 5. Mild generalized parenchymal atrophy. Electronically Signed   By: Jackey Loge D.O.   On: 03/20/2023 10:14   NM PET Image Initial (PI) Skull Base To Thigh Result Date: 03/20/2023 CLINICAL DATA:  Subsequent treatment strategy for renal cell carcinoma. Evaluate pulmonary nodules. EXAM: NUCLEAR MEDICINE PET SKULL BASE TO THIGH TECHNIQUE: 10.7 mCi F-18 FDG was injected intravenously. Full-ring PET imaging was performed from the skull base to thigh after the radiotracer. CT data was obtained and used for attenuation correction and anatomic localization. Fasting blood glucose: 144 mg/dl COMPARISON:  CT 16/10/9602 FINDINGS: NECK: No hypermetabolic lymph nodes in the neck. Incidental CT findings: None. CHEST: Hypermetabolic subcarinal lymph node with SUV max 10.1 measures 20 mm on image 61/4. Hypermetabolic 10 mm RIGHT lower paratracheal node on image 51. Enlarged hypermetabolic RIGHT infrahilar node SUV max equal 9.4 on image 65. The enlarged RIGHT lobe mass measuring 3.4 cm (image 73/4) has hypermetabolic activity SUV max equal 7.1 on image 68. There is misregistration related to breathing. Incidental CT findings: None. ABDOMEN/PELVIS: Post LEFT nephrectomy. No hypermetabolic activity in the nephrectomy bed or nodularity. No hypermetabolic abdominopelvic lymph nodes. Incidental CT findings: Liver has a lobular contour. RIGHT kidney appears normal. Cryoablation site of the RIGHT kidney without radiotracer activity. SKELETON: Moderate radiotracer activity within the LEFT iliac bone with SUV max equal 7.3 on image 143. There is subtle sclerotic lesion measuring 2.7 cm on the CT. Metabolic  activity associated with a RIGHT lateral rib fracture on image 64 SUV max equal 6. Incidental CT findings: None. IMPRESSION: 1. Hypermetabolic mediastinal and RIGHT hilar lymph nodes consistent with nodal metastasis. 2. Hypermetabolic activity associated with the RIGHT lower lobe mass consistent with metastatic disease. 3. Hypermetabolic LEFT iliac bone lesion is concerning for osseous metastasis. 4. Post left nephrectomy without evidence local recurrence. 5. Cryoablation site of the RIGHT kidney without evidence of local recurrence. 6. Metabolic activity associated with a RIGHT lateral rib fracture is favored posttraumatic. Recommend clinical correlation Electronically Signed   By: Genevive Bi M.D.   On: 03/20/2023 09:40   DG Chest Port 1 View Result Date: 02/20/2023 CLINICAL DATA:  Status post bronchoscopy with biopsy. EXAM: PORTABLE CHEST 1 VIEW COMPARISON:  CT chest dated January 10, 2023. FINDINGS: The heart size and mediastinal contours are within normal limits. Postsurgical changes near the left hilum. Normal pulmonary vascularity. Mild right basilar atelectasis. No focal consolidation, pleural effusion, or pneumothorax. No acute osseous abnormality. IMPRESSION: 1. No pneumothorax. Mild right basilar atelectasis. Electronically Signed   By: Obie Dredge M.D.   On: 02/20/2023 13:28   DG C-ARM BRONCHOSCOPY Result Date: 02/20/2023 C-ARM BRONCHOSCOPY: Fluoroscopy was utilized by the requesting physician.  No radiographic interpretation.  Labs:  CBC: Recent Labs    02/07/23 1451 02/08/23 1110 03/14/23 1116 03/20/23 1235  WBC 8.4 7.6 7.2 8.5  HGB 13.0 13.0 12.7 12.6  HCT 39.5 40.0 38.0 38.5  PLT 151 161.0 144* 172    COAGS: No results for input(s): "INR", "APTT" in the last 8760 hours.  BMP: Recent Labs    01/03/23 1044 02/07/23 1451 02/08/23 1110 03/14/23 1116 03/20/23 1235  NA 139 141 137 139 134*  K 5.4* 5.0 4.9 5.4* 5.3*  CL 107 103 101 105 101  CO2 25 30 27 27  25   GLUCOSE 228* 131* 156* 148* 138*  BUN 37* 29* 32* 45* 46*  CALCIUM 10.8* 10.8* 10.2 10.9* 10.9*  CREATININE 1.31* 1.57* 1.55* 1.46* 1.54*  GFRNONAA 41* 33*  --  36* 34*    LIVER FUNCTION TESTS: Recent Labs    02/07/23 1451 02/08/23 1110 03/14/23 1116 03/20/23 1235  BILITOT 0.9 1.0 0.8 1.0  AST 32 40* 34 27  ALT 31 36* 30 26  ALKPHOS 70 70 70 75  PROT 7.2 7.0 7.2 7.0  ALBUMIN 4.3 4.2 4.3 4.3    TUMOR MARKERS: No results for input(s): "AFPTM", "CEA", "CA199", "CHROMGRNA" in the last 8760 hours.  Assessment and Plan: Patient with past medical history of renal cell carcinoma s/p treatment presents with recently diagnosed small cell lung cancer.  IR consulted for Port-A-Cath placement at the request of Dr. Myna Hidalgo. Case reviewed by Dr. Elby Showers who approves patient for procedure.  Patient presents today in their usual state of health.  She has been NPO and is not currently on blood thinners.   Risks and benefits of image guided port-a-catheter placement was discussed with the patient including, but not limited to bleeding, infection, pneumothorax, or fibrin sheath development and need for additional procedures.  All of the patient's questions were answered, patient is agreeable to proceed. Consent signed and in chart.  Advance Care Plan: The advanced care plan/surrogate decision maker was discussed at the time of visit and documented in the medical record.     Thank you for this interesting consult.  I greatly enjoyed meeting GAE BIHL and look forward to participating in their care.  A copy of this report was sent to the requesting provider on this date.  Electronically Signed: Hoyt Koch, PA 03/21/2023, 9:16 AM   I spent a total of  30 Minutes   in face to face in clinical consultation, greater than 50% of which was counseling/coordinating care for small cell lung cancer.

## 2023-03-21 NOTE — Progress Notes (Signed)
Hematology and Oncology Follow Up Visit  ALLESHA ARONOFF [id number] 1945-05-21 78 y.o. 03/21/2023   Principle Diagnosis:  Small cell lung cancer -- Liane Comber stage Renal cell carcinoma -- Clear cell  Current Therapy:   S/p LEFT nephrectomy - 2001 Carbo/VP-16/Durvalumab -- start cycle #1 on 03/26/2023     Interim History:  Ms. Brubacher is back for follow-up.  We did get the results back for PET scan and probably MRI of the brain.  Unfortunately, looks like she does have excessive stage disease.  She does have activity in the left iliac bone.  Also seems to be some activity in a right rib.  The MRI of the brain looks okay.  She may have a meningioma.  Due to this, we will add immunotherapy to her chemotherapy protocol.  I think this would be the standard of care for extensive stage small cell lung cancer.  We are awaiting the molecular studies that have been sent off.  She is still looks good.  She has a good performance status.  She is eating well.  She does have some pain in the left hip.  We will have to watch this closely.  She has had no nausea or vomiting.  She has had no chest wall pain.  There is been no rib pain.  Overall, I would say that her performance status is ECOG 1.    Medications:  Current Outpatient Medications:    allopurinol (ZYLOPRIM) 300 MG tablet, TAKE 1 TABLET BY MOUTH EVERY DAY, Disp: 90 tablet, Rfl: 1   aspirin EC 81 MG tablet, Take 81 mg by mouth daily., Disp: , Rfl:    atorvastatin (LIPITOR) 20 MG tablet, Take 1 tablet (20 mg total) by mouth daily., Disp: 90 tablet, Rfl: 3   Calcium Carb-Cholecalciferol (CALCIUM 600 + D PO), Take 1 tablet by mouth daily., Disp: , Rfl:    carvedilol (COREG) 3.125 MG tablet, Take 0.5 tablets (1.5625 mg total) by mouth 2 (two) times daily with a meal., Disp: , Rfl:    dexamethasone (DECADRON) 4 MG tablet, Take 2 tablets daily for 2 days, on days 4 and 5.Take with food. Every 21 days., Disp: 30 tablet, Rfl: 1    diphenhydramine-acetaminophen (TYLENOL PM) 25-500 MG TABS, Take 1 tablet by mouth at bedtime as needed (sleep)., Disp: , Rfl:    furosemide (LASIX) 40 MG tablet, TAKE 1 TABLET BY MOUTH EVERY DAY, Disp: 90 tablet, Rfl: 1   glimepiride (AMARYL) 2 MG tablet, TAKE 1 TABLET BY MOUTH EVERY DAY WITH BREAKFAST, Disp: 90 tablet, Rfl: 1   glucose blood (ONETOUCH VERIO) test strip, Check Blood Sugar 3 times daily., Disp: 100 strip, Rfl: 4   hyoscyamine (LEVSIN SL) 0.125 MG SL tablet, Place 1 tablet (0.125 mg total) under the tongue every 6 (six) hours as needed., Disp: 30 tablet, Rfl: 1   insulin glargine (LANTUS SOLOSTAR) 100 UNIT/ML Solostar Pen, Inject 14 Units into the skin every morning., Disp: , Rfl:    Insulin Pen Needle (BD PEN NEEDLE NANO 2ND GEN) 32G X 4 MM MISC, USE AS DIRECTED WITH LANTUS ONCE A DAY., Disp: 100 each, Rfl: 3   irbesartan (AVAPRO) 75 MG tablet, Take 1 tablet (75 mg total) by mouth daily., Disp: 90 tablet, Rfl: 3   Lancets (ONETOUCH DELICA PLUS LANCET33G) MISC, USE AS DIRECTED TO TEST DAILY, Disp: 100 each, Rfl: 12   meclizine (ANTIVERT) 12.5 MG tablet, Take 1 tablet (12.5 mg total) by mouth 3 (three) times daily as needed for  dizziness. As needed for   Dizziness or nausea, Disp: 30 tablet, Rfl: 1   nystatin cream (MYCOSTATIN), Apply 1 application topically 2 (two) times daily., Disp: 30 g, Rfl: 1   ondansetron (ZOFRAN) 8 MG tablet, Take 1 tablet (8 mg total) by mouth every 8 (eight) hours as needed for nausea or vomiting. Start on third day after chemotherapy., Disp: 30 tablet, Rfl: 1   prochlorperazine (COMPAZINE) 10 MG tablet, Take 1 tablet (10 mg total) by mouth every 6 (six) hours as needed for nausea or vomiting (Nausea or vomiting)., Disp: 30 tablet, Rfl: 1   ramelteon (ROZEREM) 8 MG tablet, Take 1 tablet (8 mg total) by mouth at bedtime., Disp: 30 tablet, Rfl: 1   Vitamin D, Ergocalciferol, (DRISDOL) 1.25 MG (50000 UNIT) CAPS capsule, Take 1 capsule (50,000 Units total) by mouth  every 7 (seven) days. TAKE 1 CAPSULE (50,000 UNITS TOTAL) BY MOUTH EVERY 7 (SEVEN) DAYS (Patient taking differently: Take 50,000 Units by mouth every 7 (seven) days. TAKE 1 CAPSULE (50,000 UNITS TOTAL) BY MOUTH EVERY 7 (SEVEN) DAYS - takes on sunday), Disp: 5 capsule, Rfl: 4   zolpidem (AMBIEN) 5 MG tablet, Take 1 tablet (5 mg total) by mouth at bedtime as needed for sleep., Disp: 30 tablet, Rfl: 2  Allergies: No Known Allergies  Past Medical History, Surgical history, Social history, and Family History were reviewed and updated.  Review of Systems: Review of Systems  Constitutional: Negative.   HENT:  Negative.    Eyes: Negative.   Respiratory: Negative.    Cardiovascular: Negative.   Gastrointestinal: Negative.   Endocrine: Negative.   Genitourinary: Negative.    Musculoskeletal: Negative.   Skin: Negative.   Neurological: Negative.   Hematological: Negative.   Psychiatric/Behavioral: Negative.      Physical Exam: Vital signs show temperature of 98.5.  Pulse 87.  Blood pressure 124/59.  Weight is 215 pounds.  Wt Readings from Last 3 Encounters:  03/21/23 212 lb (96.2 kg)  03/14/23 215 lb (97.5 kg)  03/01/23 215 lb 8 oz (97.8 kg)    Physical Exam Vitals reviewed.  HENT:     Head: Normocephalic and atraumatic.  Eyes:     Pupils: Pupils are equal, round, and reactive to light.  Cardiovascular:     Rate and Rhythm: Normal rate and regular rhythm.     Heart sounds: Normal heart sounds.  Pulmonary:     Effort: Pulmonary effort is normal.     Breath sounds: Normal breath sounds.     Comments: Pulmonary exam shows good breath sounds bilaterally.  I hear no wheezing.  She has good air movement bilaterally. Abdominal:     General: Bowel sounds are normal.     Palpations: Abdomen is soft.     Comments: Abdominal exam shows well-healed laparotomy scar left side of the abdomen.  There is no fluid wave.  There is no palpable liver or spleen tip.  Musculoskeletal:         General: No tenderness or deformity. Normal range of motion.     Cervical back: Normal range of motion.  Lymphadenopathy:     Cervical: No cervical adenopathy.  Skin:    General: Skin is warm and dry.     Findings: No erythema or rash.  Neurological:     Mental Status: She is alert and oriented to person, place, and time.  Psychiatric:        Behavior: Behavior normal.        Thought Content: Thought  content normal.        Judgment: Judgment normal.      Lab Results  Component Value Date   WBC 8.5 03/20/2023   HGB 12.6 03/20/2023   HCT 38.5 03/20/2023   MCV 100.5 (H) 03/20/2023   PLT 172 03/20/2023     Chemistry      Component Value Date/Time   NA 134 (L) 03/20/2023 1235   NA 139 08/23/2017 0933   NA 140 11/01/2016 0818   K 5.3 (H) 03/20/2023 1235   K 4.4 11/01/2016 0818   CL 101 03/20/2023 1235   CL 107 04/16/2012 0923   CO2 25 03/20/2023 1235   CO2 26 11/01/2016 0818   BUN 46 (H) 03/20/2023 1235   BUN 24 08/23/2017 0933   BUN 17.9 11/01/2016 0818   CREATININE 1.54 (H) 03/20/2023 1235   CREATININE 1.1 11/01/2016 0818   GLU 151 (H) 09/30/2019 1014      Component Value Date/Time   CALCIUM 10.9 (H) 03/20/2023 1235   CALCIUM 10.3 11/01/2016 0818   ALKPHOS 75 03/20/2023 1235   ALKPHOS 72 11/01/2016 0818   AST 27 03/20/2023 1235   AST 75 (H) 11/01/2016 0818   ALT 26 03/20/2023 1235   ALT 75 (H) 11/01/2016 0818   BILITOT 1.0 03/20/2023 1235   BILITOT 0.78 11/01/2016 0818       Impression and Plan: Ms. Melrose is a very charming 78 year old white female.  Surprising, she now has a new malignancy.  This is a small cell lung cancer.  This is extensive stage small cell lung cancer.  She will have her chemotherapy teaching today.  Again, I think that Durvalumab along with carboplatinum and etoposide would be a great idea for her.  I think she would benefit.  I think she could tolerate this.  I think that she has a good 70-75% chance of responding.  I told she and  her family that this is a tumor that we can treat but we cannot cure because of the fact that it is metastasized.  Again, we want to make sure quality of life is the key.  We will going get everything started next week.   Josph Macho, MD 2/19/202512:58 PM

## 2023-03-22 ENCOUNTER — Inpatient Hospital Stay: Payer: No Typology Code available for payment source

## 2023-03-23 ENCOUNTER — Ambulatory Visit: Payer: No Typology Code available for payment source

## 2023-03-23 NOTE — Telephone Encounter (Signed)
 Patient did not return call. Closing encounter.

## 2023-03-25 NOTE — Assessment & Plan Note (Signed)
 hgba1c acceptable, minimize simple carbs. Increase exercise as tolerated. Continue current meds

## 2023-03-25 NOTE — Assessment & Plan Note (Signed)
 Supplement and monitor

## 2023-03-25 NOTE — Assessment & Plan Note (Signed)
 Asymptomatic, continue to monitor.

## 2023-03-25 NOTE — Assessment & Plan Note (Signed)
 Hydrate and monitor

## 2023-03-25 NOTE — Assessment & Plan Note (Signed)
 Following with oncology

## 2023-03-25 NOTE — Assessment & Plan Note (Signed)
 No recent exacerbation

## 2023-03-25 NOTE — Assessment & Plan Note (Signed)
 Encourage heart healthy diet such as MIND or DASH diet, increase exercise, avoid trans fats, simple carbohydrates and processed foods, consider a krill or fish or flaxseed oil cap daily.

## 2023-03-26 ENCOUNTER — Inpatient Hospital Stay: Payer: No Typology Code available for payment source

## 2023-03-26 ENCOUNTER — Encounter: Payer: Self-pay | Admitting: *Deleted

## 2023-03-26 VITALS — BP 120/54 | HR 68 | Temp 98.0°F | Resp 16

## 2023-03-26 DIAGNOSIS — Z5112 Encounter for antineoplastic immunotherapy: Secondary | ICD-10-CM | POA: Diagnosis not present

## 2023-03-26 DIAGNOSIS — C342 Malignant neoplasm of middle lobe, bronchus or lung: Secondary | ICD-10-CM

## 2023-03-26 MED ORDER — SODIUM CHLORIDE 0.9 % IV SOLN
80.0000 mg/m2 | Freq: Once | INTRAVENOUS | Status: AC
  Administered 2023-03-26: 168 mg via INTRAVENOUS
  Filled 2023-03-26: qty 8.4

## 2023-03-26 MED ORDER — SODIUM CHLORIDE 0.9 % IV SOLN
1500.0000 mg | Freq: Once | INTRAVENOUS | Status: AC
  Administered 2023-03-26: 1500 mg via INTRAVENOUS
  Filled 2023-03-26: qty 30

## 2023-03-26 MED ORDER — DEXAMETHASONE SODIUM PHOSPHATE 10 MG/ML IJ SOLN
10.0000 mg | Freq: Once | INTRAMUSCULAR | Status: AC
  Administered 2023-03-26: 10 mg via INTRAVENOUS
  Filled 2023-03-26: qty 1

## 2023-03-26 MED ORDER — PALONOSETRON HCL INJECTION 0.25 MG/5ML
0.2500 mg | Freq: Once | INTRAVENOUS | Status: AC
Start: 2023-03-26 — End: 2023-03-26
  Administered 2023-03-26: 0.25 mg via INTRAVENOUS
  Filled 2023-03-26: qty 5

## 2023-03-26 MED ORDER — HEPARIN SOD (PORK) LOCK FLUSH 100 UNIT/ML IV SOLN
500.0000 [IU] | Freq: Once | INTRAVENOUS | Status: DC | PRN
Start: 2023-03-26 — End: 2023-03-26

## 2023-03-26 MED ORDER — SODIUM CHLORIDE 0.9 % IV SOLN
150.0000 mg | Freq: Once | INTRAVENOUS | Status: AC
  Administered 2023-03-26: 150 mg via INTRAVENOUS
  Filled 2023-03-26: qty 150

## 2023-03-26 MED ORDER — SODIUM CHLORIDE 0.9% FLUSH: 10.0000 mL | INTRAVENOUS | Status: DC | PRN

## 2023-03-26 MED ORDER — SODIUM CHLORIDE 0.9 % IV SOLN
282.4000 mg | Freq: Once | INTRAVENOUS | Status: AC
  Administered 2023-03-26: 280 mg via INTRAVENOUS
  Filled 2023-03-26: qty 28

## 2023-03-26 MED ORDER — SODIUM CHLORIDE 0.9 % IV SOLN: INTRAVENOUS | Status: DC

## 2023-03-26 NOTE — Progress Notes (Signed)
 Patient here to begin treatment. She had her port placed last week and had chemo education. She is ready to begin treatment today. She has all her symptom management meds at home. Reviewed on-call services and when to call the office. Answered her, and her daughters  questions to their satisfaction.   Oncology Nurse Navigator Documentation     03/26/2023   12:15 PM  Oncology Nurse Navigator Flowsheets  Phase of Treatment Chemotherapy  Chemotherapy Actual Start Date: 03/26/2023  Chemotherapy Expected End Date: 05/30/2023  Navigator Follow Up Date: 04/16/2023  Navigator Follow Up Reason: Follow-up Appointment;Chemotherapy  Navigator Radiographer, therapeutic Encounter Type Treatment  Patient Visit Type MedOnc  Treatment Phase First Chemo Tx  Barriers/Navigation Needs Coordination of Care;Education  Education Pain/ Symptom Management;Other  Interventions Education;Psycho-Social Support  Acuity Level 2-Minimal Needs (1-2 Barriers Identified)  Education Method Verbal  Support Groups/Services Friends and Family  Time Spent with Patient 30

## 2023-03-26 NOTE — Patient Instructions (Signed)
 [facility] - A DEPT OF [facility]  Discharge Instructions: Thank you for choosing [facility] to provide your oncology and hematology care.   If you have a lab appointment with the Cancer Center, please go directly to the Cancer Center and check in at the registration area.  Wear comfortable clothing and clothing appropriate for easy access to any Portacath or PICC line.   We strive to give you quality time with your provider. You may need to reschedule your appointment if you arrive late (15 or more minutes).  Arriving late affects you and other patients whose appointments are after yours.  Also, if you miss three or more appointments without notifying the office, you may be dismissed from the clinic at the provider's discretion.      For prescription refill requests, have your pharmacy contact our office and allow 72 hours for refills to be completed.    Today you received the following chemotherapy and/or immunotherapy agents VP16 Carboplatin Imfinzi       To help prevent nausea and vomiting after your treatment, we encourage you to take your nausea medication as directed.  BELOW ARE SYMPTOMS THAT SHOULD BE REPORTED IMMEDIATELY: *FEVER GREATER THAN 100.4 F (38 C) OR HIGHER *CHILLS OR SWEATING *NAUSEA AND VOMITING THAT IS NOT CONTROLLED WITH YOUR NAUSEA MEDICATION *UNUSUAL SHORTNESS OF BREATH *UNUSUAL BRUISING OR BLEEDING *URINARY PROBLEMS (pain or burning when urinating, or frequent urination) *BOWEL PROBLEMS (unusual diarrhea, constipation, pain near the anus) TENDERNESS IN MOUTH AND THROAT WITH OR WITHOUT PRESENCE OF ULCERS (sore throat, sores in mouth, or a toothache) UNUSUAL RASH, SWELLING OR PAIN  UNUSUAL VAGINAL DISCHARGE OR ITCHING   Items with * indicate a potential emergency and should be followed up as soon as possible or go to the Emergency Department if any problems should occur.  Please show the CHEMOTHERAPY ALERT CARD or  IMMUNOTHERAPY ALERT CARD at check-in to the Emergency Department and triage nurse. Should you have questions after your visit or need to cancel or reschedule your appointment, please contact [facility] - A DEPT OF [facility]  [phone] and follow the prompts.  Office hours are 8:00 a.m. to 4:30 p.m. Monday - Friday. Please note that voicemails left after 4:00 p.m. may not be returned until the following business day.  We are closed weekends and major holidays. You have access to a nurse at all times for urgent questions. Please call the main number to the clinic [phone] and follow the prompts.  For any non-urgent questions, you may also contact your provider using MyChart. We now offer e-Visits for anyone 41 and older to request care online for non-urgent symptoms. For details visit [email or web address].   Also download the MyChart app! Go to the app store, search "MyChart", open the app, select [place], and log in with your MyChart username and password.

## 2023-03-26 NOTE — Progress Notes (Signed)
 Per Dr Myna Hidalgo, ok to treat with labs from 03/20/2023 today. dph

## 2023-03-27 ENCOUNTER — Inpatient Hospital Stay: Payer: No Typology Code available for payment source

## 2023-03-27 VITALS — BP 93/56 | HR 97 | Temp 97.8°F | Resp 17

## 2023-03-27 DIAGNOSIS — C342 Malignant neoplasm of middle lobe, bronchus or lung: Secondary | ICD-10-CM

## 2023-03-27 DIAGNOSIS — Z5112 Encounter for antineoplastic immunotherapy: Secondary | ICD-10-CM | POA: Diagnosis not present

## 2023-03-27 MED ORDER — SODIUM CHLORIDE 0.9 % IV SOLN: INTRAVENOUS | Status: DC

## 2023-03-27 MED ORDER — HOT PACK MISC ONCOLOGY
1.0000 | Freq: Once | Status: DC | PRN
Start: 2023-03-27 — End: 2023-03-27

## 2023-03-27 MED ORDER — DEXAMETHASONE SODIUM PHOSPHATE 10 MG/ML IJ SOLN
10.0000 mg | Freq: Once | INTRAMUSCULAR | Status: AC
  Administered 2023-03-27: 10 mg via INTRAVENOUS
  Filled 2023-03-27: qty 1

## 2023-03-27 MED ORDER — SODIUM CHLORIDE 0.9% FLUSH
10.0000 mL | INTRAVENOUS | Status: DC | PRN
  Administered 2023-03-27: 10 mL

## 2023-03-27 MED ORDER — HEPARIN SOD (PORK) LOCK FLUSH 100 UNIT/ML IV SOLN
500.0000 [IU] | Freq: Once | INTRAVENOUS | Status: AC | PRN
  Administered 2023-03-27: 500 [IU]

## 2023-03-27 MED ORDER — SODIUM CHLORIDE 0.9 % IV SOLN
80.0000 mg/m2 | Freq: Once | INTRAVENOUS | Status: AC
  Administered 2023-03-27: 168 mg via INTRAVENOUS
  Filled 2023-03-27: qty 8.4

## 2023-03-28 ENCOUNTER — Inpatient Hospital Stay: Payer: No Typology Code available for payment source

## 2023-03-28 VITALS — BP 123/56 | HR 89 | Temp 98.1°F | Resp 17

## 2023-03-28 DIAGNOSIS — C342 Malignant neoplasm of middle lobe, bronchus or lung: Secondary | ICD-10-CM

## 2023-03-28 DIAGNOSIS — Z5112 Encounter for antineoplastic immunotherapy: Secondary | ICD-10-CM | POA: Diagnosis not present

## 2023-03-28 MED ORDER — DEXAMETHASONE SODIUM PHOSPHATE 10 MG/ML IJ SOLN
10.0000 mg | Freq: Once | INTRAMUSCULAR | Status: AC
  Administered 2023-03-28: 10 mg via INTRAVENOUS
  Filled 2023-03-28: qty 1

## 2023-03-28 MED ORDER — HEPARIN SOD (PORK) LOCK FLUSH 100 UNIT/ML IV SOLN
500.0000 [IU] | Freq: Once | INTRAVENOUS | Status: AC | PRN
Start: 2023-03-28 — End: 2023-03-28
  Administered 2023-03-28: 500 [IU]

## 2023-03-28 MED ORDER — SODIUM CHLORIDE 0.9 % IV SOLN
80.0000 mg/m2 | Freq: Once | INTRAVENOUS | Status: AC
  Administered 2023-03-28: 168 mg via INTRAVENOUS
  Filled 2023-03-28: qty 8.4

## 2023-03-28 MED ORDER — SODIUM CHLORIDE 0.9% FLUSH
10.0000 mL | INTRAVENOUS | Status: DC | PRN
  Administered 2023-03-28: 10 mL

## 2023-03-28 MED ORDER — SODIUM CHLORIDE 0.9 % IV SOLN: INTRAVENOUS | Status: DC

## 2023-03-28 NOTE — Patient Instructions (Signed)
 [facility]  Discharge Instructions: Thank you for choosing [facility] to provide your oncology and hematology care.   If you have a lab appointment with the Cancer Center, please go directly to the Cancer Center and check in at the registration area.  Wear comfortable clothing and clothing appropriate for easy access to any Portacath or PICC line.   We strive to give you quality time with your provider. You may need to reschedule your appointment if you arrive late (15 or more minutes).  Arriving late affects you and other patients whose appointments are after yours.  Also, if you miss three or more appointments without notifying the office, you may be dismissed from the clinic at the provider's discretion.      For prescription refill requests, have your pharmacy contact our office and allow 72 hours for refills to be completed.    Today you received the following chemotherapy and/or immunotherapy agents Etoposide       To help prevent nausea and vomiting after your treatment, we encourage you to take your nausea medication as directed.  BELOW ARE SYMPTOMS THAT SHOULD BE REPORTED IMMEDIATELY: *FEVER GREATER THAN 100.4 F (38 C) OR HIGHER *CHILLS OR SWEATING *NAUSEA AND VOMITING THAT IS NOT CONTROLLED WITH YOUR NAUSEA MEDICATION *UNUSUAL SHORTNESS OF BREATH *UNUSUAL BRUISING OR BLEEDING *URINARY PROBLEMS (pain or burning when urinating, or frequent urination) *BOWEL PROBLEMS (unusual diarrhea, constipation, pain near the anus) TENDERNESS IN MOUTH AND THROAT WITH OR WITHOUT PRESENCE OF ULCERS (sore throat, sores in mouth, or a toothache) UNUSUAL RASH, SWELLING OR PAIN  UNUSUAL VAGINAL DISCHARGE OR ITCHING   Items with * indicate a potential emergency and should be followed up as soon as possible or go to the Emergency Department if any problems should occur.  Please show the CHEMOTHERAPY ALERT CARD or IMMUNOTHERAPY  ALERT CARD at check-in to the Emergency Department and triage nurse. Should you have questions after your visit or need to cancel or reschedule your appointment, please contact [facility] - A DEPT OF [facility]  [phone] and follow the prompts.  Office hours are 8:00 a.m. to 4:30 p.m. Monday - Friday. Please note that voicemails left after 4:00 p.m. may not be returned until the following business day.  We are closed weekends and major holidays. You have access to a nurse at all times for urgent questions. Please call the main number to the clinic [phone] and follow the prompts.  For any non-urgent questions, you may also contact your provider using MyChart. We now offer e-Visits for anyone 13 and older to request care online for non-urgent symptoms. For details visit [email or web address].   Also download the MyChart app! Go to the app store, search "MyChart", open the app, select [place], and log in with your MyChart username and password.

## 2023-03-29 ENCOUNTER — Inpatient Hospital Stay: Payer: No Typology Code available for payment source | Admitting: Dietician

## 2023-03-29 ENCOUNTER — Telehealth (INDEPENDENT_AMBULATORY_CARE_PROVIDER_SITE_OTHER): Payer: No Typology Code available for payment source | Admitting: Family Medicine

## 2023-03-29 VITALS — BP 121/76 | HR 88

## 2023-03-29 DIAGNOSIS — M1A9XX0 Chronic gout, unspecified, without tophus (tophi): Secondary | ICD-10-CM | POA: Diagnosis not present

## 2023-03-29 DIAGNOSIS — E119 Type 2 diabetes mellitus without complications: Secondary | ICD-10-CM | POA: Diagnosis not present

## 2023-03-29 DIAGNOSIS — E785 Hyperlipidemia, unspecified: Secondary | ICD-10-CM

## 2023-03-29 DIAGNOSIS — N289 Disorder of kidney and ureter, unspecified: Secondary | ICD-10-CM | POA: Diagnosis not present

## 2023-03-29 DIAGNOSIS — I5022 Chronic systolic (congestive) heart failure: Secondary | ICD-10-CM

## 2023-03-29 DIAGNOSIS — C342 Malignant neoplasm of middle lobe, bronchus or lung: Secondary | ICD-10-CM

## 2023-03-29 DIAGNOSIS — E559 Vitamin D deficiency, unspecified: Secondary | ICD-10-CM

## 2023-03-29 NOTE — Progress Notes (Signed)
 Nutrition Assessment: Reached out to patient at home telephone number she had me on speaker and spouse was with her.    Reason for Assessment: New Patient Assessment   ASSESSMENT: Patient is 78 year old female with Small cell lung cancer -- Extensive stage just starting Carbo/VP-16/Durvalumab treatment with Dr. Myna Hidalgo.  She has PMHX that includes Renal cell carcinoma -- Clear cell S/p LEFT nephrectomy - 2001, DM2, GERD, Diverticulitis, Hyperkalemia, Hypercalcemia, Vit D Deficiency, She reports no nutritional concerns at this time other than sleeping altered which reduces the number of meals she eats per day.  She has supportive neighbors, family and church friends nearby.  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 Usual foods: Brunch and supper due to sleeping concerns, OSA can't wear mask. Eggs over easy, grits, sometimes sausage and grits with fruit (peach or banana) Baked chicken or broiled meat at least a vegetable, not always a starch (spouse loves potatoes) Fluids: water (3 bottles), Propel, no sugar soft drinks, Ginger ale or lemon lime, Coffee decaf with creamer   Nutrition Focused Physical Exam: unable to perform NFPE   Medications: Vit D   Labs: 03/1823  GFR 34, K+ 5.3, BUN 46, Creat 1.54   Anthropometrics: little fluctuations lately  Height: 64" Weight: 212# UBW: 212-215# BMI: 36.39   Estimated Energy Needs  Kcals: 2400 Protein: 76-96 Fluid: 2.5 L   NUTRITION DIAGNOSIS: Food and Nutrition Related Knowledge Deficit related to cancer and associated treatments as evidenced by no prior need for nutrition related information.   INTERVENTION:   Relayed that nutrition services are wrap around service provided at no charge and encouraged continued communication if experiencing any nutritional impact symptoms (NIS). Educated on importance of adequate nourishment with calorie and protein energy intake with nutrient dense foods when possible to maintain  weight/strength and QOL.   Encouraged soft moist foods if she develops esophagitis Discussed strategies for controlling potassium (relayed top 4 plant sources and suggested limiting to 1 1/2 cup portion per day to start) Emailed Nutrition Tip sheet  for  potassium to daughter's email on file with contact information provided.  MONITORING, EVALUATION, GOAL: weight trends, nutrition impact symptoms, PO intake, labs   Next Visit: remote next month  Gennaro Africa, RDN, LDN Registered Dietitian, [facility] Part Time Remote (Usual office hours: Tuesday-Thursday) Mobile: [phone]

## 2023-03-30 ENCOUNTER — Inpatient Hospital Stay: Payer: No Typology Code available for payment source

## 2023-03-30 ENCOUNTER — Telehealth: Payer: Self-pay

## 2023-03-30 VITALS — BP 95/45 | HR 73 | Temp 97.8°F

## 2023-03-30 DIAGNOSIS — Z5112 Encounter for antineoplastic immunotherapy: Secondary | ICD-10-CM | POA: Diagnosis not present

## 2023-03-30 DIAGNOSIS — C342 Malignant neoplasm of middle lobe, bronchus or lung: Secondary | ICD-10-CM

## 2023-03-30 MED ORDER — PEGFILGRASTIM-JMDB 6 MG/0.6ML ~~LOC~~ SOSY
6.0000 mg | PREFILLED_SYRINGE | Freq: Once | SUBCUTANEOUS | Status: AC
  Administered 2023-03-30: 6 mg via SUBCUTANEOUS
  Filled 2023-03-30: qty 0.6

## 2023-03-30 NOTE — Patient Instructions (Signed)

## 2023-03-30 NOTE — Telephone Encounter (Signed)
 Clinical Social Work was referred by Charity fundraiser for assessment of psychosocial needs.  CSW attempted to contact patient by phone.  Left voicemail with contact information and request for return call.

## 2023-04-01 ENCOUNTER — Encounter: Payer: Self-pay | Admitting: Family Medicine

## 2023-04-01 DIAGNOSIS — R0602 Shortness of breath: Secondary | ICD-10-CM | POA: Diagnosis not present

## 2023-04-01 DIAGNOSIS — R042 Hemoptysis: Secondary | ICD-10-CM | POA: Diagnosis not present

## 2023-04-01 DIAGNOSIS — C3490 Malignant neoplasm of unspecified part of unspecified bronchus or lung: Secondary | ICD-10-CM | POA: Diagnosis not present

## 2023-04-01 DIAGNOSIS — I2510 Atherosclerotic heart disease of native coronary artery without angina pectoris: Secondary | ICD-10-CM | POA: Diagnosis not present

## 2023-04-01 DIAGNOSIS — R918 Other nonspecific abnormal finding of lung field: Secondary | ICD-10-CM | POA: Diagnosis not present

## 2023-04-01 DIAGNOSIS — J986 Disorders of diaphragm: Secondary | ICD-10-CM | POA: Diagnosis not present

## 2023-04-01 DIAGNOSIS — Z87891 Personal history of nicotine dependence: Secondary | ICD-10-CM | POA: Diagnosis not present

## 2023-04-01 DIAGNOSIS — Z79899 Other long term (current) drug therapy: Secondary | ICD-10-CM | POA: Diagnosis not present

## 2023-04-01 DIAGNOSIS — Z85118 Personal history of other malignant neoplasm of bronchus and lung: Secondary | ICD-10-CM | POA: Diagnosis not present

## 2023-04-01 DIAGNOSIS — I700 Atherosclerosis of aorta: Secondary | ICD-10-CM | POA: Diagnosis not present

## 2023-04-01 DIAGNOSIS — I517 Cardiomegaly: Secondary | ICD-10-CM | POA: Diagnosis not present

## 2023-04-01 NOTE — Progress Notes (Addendum)
 "  MyChart Video Visit    Virtual Visit via Video Note   This patient is at least at moderate risk for complications without adequate follow up. This format is felt to be most appropriate for this patient at this time. Physical exam was limited by quality of the video and audio technology used for the visit. Amanda Crawford, Amanda Crawford was able to get the patient set up on a video visit.  Patient location: home Patient and provider in visit Provider location: Office  I discussed the limitations of evaluation and management by telemedicine and the availability of in person appointments. The patient expressed understanding and agreed to proceed.  Visit Date: 04/03/2023  Today's healthcare provider: Harlene Horton, MD  Subjective:    Patient ID: Amanda Crawford, female    DOB: March 19, 1945, 78 y.o.   MRN: [id number]  Chief Complaint  Patient presents with   Follow-up    HPI Discussed the use of AI scribe software for clinical note transcription with the patient, who gave verbal consent to proceed.  History of Present Illness Amanda Crawford is a 78 year old female who presents for follow-up regarding her cancer treatment and management.of chronic medical concerns. No recent febrile illness.No recent complaints of CP/palp/SOB/HA/congestion/fevers/GI or GU c/o. Taking meds as prescribed   She is currently undergoing treatment for a different type of cancer. She is managing her treatment regimen well and is able to maintain her fluid and protein intake, which is crucial for her strength during this period.  She does not report any significant issues with her current medications or treatment plan and is not aware of any refills needed at this time.    Past Medical History:  Diagnosis Date   Allergic state 03/12/2015   Arthritis    Cirrhosis (HCC)    Colon polyp 11/19/2014   Dermatitis 03/12/2015   Diarrhea 06/29/2016   Diverticulitis    DM (diabetes mellitus), type 2 (HCC) 06/29/2016   GERD  (gastroesophageal reflux disease)    occ   Gout 03/12/2015   H/O measles    H/O mumps    Headache(784.0)    migraines   History of chicken pox 11/23/2014   Hyperglycemia 06/29/2016   Insomnia 02/13/2023   Lung cancer (HCC)    Migraine 11/23/2014   Obesity 11/23/2014   Pneumonia    child   Preventative health care 09/12/2015   Primary hypertension 12/15/2020   Renal cell cancer (HCC)    renal cell ca dx 9/01 and 8/08;   Renal insufficiency    Shortness of breath    occ   Skin lesion of breast 03/12/2015   Sleep apnea 01/24/2023   recently dx 01/24/23 - CPAP ordered by not received yet   Small cell carcinoma of middle lobe of right lung (HCC) 03/14/2023   Small cell lung cancer, right middle lobe (HCC) 03/14/2023    Past Surgical History:  Procedure Laterality Date   ABDOMINAL HYSTERECTOMY     ANAL RECTAL MANOMETRY N/A 06/29/2021   Procedure: ANO RECTAL MANOMETRY;  Surgeon: San Sandor GAILS, DO;  Location: [facility] ENDOSCOPY;  Service: Gastroenterology;  Laterality: N/A;   APPENDECTOMY     BRONCHIAL BIOPSY  02/20/2023   Procedure: BRONCHIAL BIOPSIES;  Surgeon: Shelah Lamar RAMAN, MD;  Location: [facility] ENDOSCOPY;  Service: Pulmonary;;   BRONCHIAL BRUSHINGS  02/20/2023   Procedure: BRONCHIAL BRUSHINGS;  Surgeon: Shelah Lamar RAMAN, MD;  Location: [facility] ENDOSCOPY;  Service: Pulmonary;;   BRONCHIAL NEEDLE ASPIRATION BIOPSY  02/20/2023   Procedure:  BRONCHIAL NEEDLE ASPIRATION BIOPSIES;  Surgeon: Shelah Lamar RAMAN, MD;  Location: [facility] ENDOSCOPY;  Service: Pulmonary;;   CARDIAC CATHETERIZATION     yrs ago neg   CHOLECYSTECTOMY     COLONOSCOPY  2018   ESOPHAGOGASTRODUODENOSCOPY     years ago   FINE NEEDLE ASPIRATION  02/20/2023   Procedure: FINE NEEDLE ASPIRATION (FNA) LINEAR;  Surgeon: Shelah Lamar RAMAN, MD;  Location: [facility] ENDOSCOPY;  Service: Pulmonary;;   IR IMAGING GUIDED PORT INSERTION  03/21/2023   IR RADIOLOGIST EVAL & MGMT  07/17/2018   IR RADIOLOGIST EVAL & MGMT  09/12/2018   IR RADIOLOGIST  EVAL & MGMT  06/18/2019   IR RADIOLOGIST EVAL & MGMT  01/13/2020   IR RADIOLOGIST EVAL & MGMT  07/08/2020   IR RADIOLOGIST EVAL & MGMT  10/12/2020   KIDNEY SURGERY  2001   Removed of left kidney    LUNG CANCER SURGERY Left 2008   RADIOLOGY WITH ANESTHESIA N/A 08/21/2018   Procedure: CT WITH ANESTHESIA RENAL CRYOABLATION;  Surgeon: Johann Sieving, MD;  Location: [facility] ORS;  Service: Radiology;  Laterality: N/A;   RENAL MASS EXCISION Left 2001   RIGHT/LEFT HEART CATH AND CORONARY ANGIOGRAPHY N/A 07/27/2016   Procedure: Right/Left Heart Cath and Coronary Angiography;  Surgeon: Anner Alm ORN, MD;  Location: [facility] INVASIVE CV LAB;  Service: Cardiovascular;  Laterality: N/A;   THORACOTOMY Left 05/09/2012   Procedure: THORACOTOMY MAJOR;  Surgeon: Dorise MARLA Fellers, MD;  Location: [facility] OR;  Service: Thoracic;  Laterality: Left;   VIDEO BRONCHOSCOPY N/A 05/09/2012   Procedure: VIDEO BRONCHOSCOPY;  Surgeon: Dorise MARLA Fellers, MD;  Location: [facility] OR;  Service: Thoracic;  Laterality: N/A;   VIDEO BRONCHOSCOPY WITH ENDOBRONCHIAL ULTRASOUND  02/20/2023   Procedure: VIDEO BRONCHOSCOPY WITH ENDOBRONCHIAL ULTRASOUND;  Surgeon: Shelah Lamar RAMAN, MD;  Location: [facility] ENDOSCOPY;  Service: Pulmonary;;   WEDGE RESECTION Left 05/09/2012   Procedure: LEFT UPPER LOBE WEDGE RESECTION;  Surgeon: Dorise MARLA Fellers, MD;  Location: [facility] OR;  Service: Thoracic;  Laterality: Left;    Family History  Problem Relation Age of Onset   Dementia Mother    Heart failure Father    Diabetes Father    Hyperlipidemia Father    Heart disease Father    Hypertension Father    Asthma Brother    Asthma Daughter    Alcohol abuse Maternal Aunt    Colon cancer Paternal Aunt    Cancer Paternal Grandmother    Hearing loss Paternal Grandfather    Stroke Paternal Grandfather     Social History   Socioeconomic History   Marital status: Married    Spouse name: Not on file   Number of children: Not on file   Years of education: Not on file   Highest  education level: Not on file  Occupational History   Occupation: retired  Tobacco Use   Smoking status: Former    Current packs/day: 0.00    Average packs/day: 0.5 packs/day for 35.0 years (17.5 ttl pk-yrs)    Types: Cigarettes    Start date: 05/08/1971    Quit date: 05/08/2006    Years since quitting: 16.9   Smokeless tobacco: Never  Vaping Use   Vaping status: Never Used  Substance and Sexual Activity   Alcohol use: Yes    Comment: seldomly   Drug use: No   Sexual activity: Not Currently    Comment: lives with husband, no dietary restrictions.   Other Topics Concern   Not on file  Social History  Narrative   Not on file   Social Drivers of Health   Financial Resource Strain: Low Risk  (07/05/2020)   Overall Financial Resource Strain (CARDIA)    Difficulty of Paying Living Expenses: Not hard at all  Food Insecurity: No Food Insecurity (12/04/2022)   Hunger Vital Sign    Worried About Running Out of Food in the Last Year: Never true    Ran Out of Food in the Last Year: Never true  Transportation Needs: No Transportation Needs (12/04/2022)   PRAPARE - Administrator, Civil Service (Medical): No    Lack of Transportation (Non-Medical): No  Physical Activity: Inactive (07/05/2020)   Exercise Vital Sign    Days of Exercise per Week: 0 days    Minutes of Exercise per Session: 0 min  Stress: No Stress Concern Present (08/12/2022)   Received from [facility] - Occupational Stress Questionnaire    Feeling of Stress : Not at all  Social Connections: Unknown (06/06/2021)   Received from [facility]   Social Network    Social Network: Not on file  Intimate Partner Violence: Not At Risk (12/04/2022)   Humiliation, Afraid, Rape, and Kick questionnaire    Fear of Current or Ex-Partner: No    Emotionally Abused: No    Physically Abused: No    Sexually Abused: No    Outpatient Medications Prior to Visit  Medication  Sig Dispense Refill   allopurinol  (ZYLOPRIM ) 300 MG tablet TAKE 1 TABLET BY MOUTH EVERY DAY 90 tablet 1   aspirin  EC 81 MG tablet Take 81 mg by mouth daily.     atorvastatin  (LIPITOR) 20 MG tablet Take 1 tablet (20 mg total) by mouth daily. 90 tablet 3   Calcium  Carb-Cholecalciferol (CALCIUM  600 + D PO) Take 1 tablet by mouth daily.     carvedilol  (COREG ) 3.125 MG tablet Take 0.5 tablets (1.5625 mg total) by mouth 2 (two) times daily with a meal.     dexamethasone  (DECADRON ) 4 MG tablet Take 2 tablets daily for 2 days, on days 4 and 5.Take with food. Every 21 days. 30 tablet 1   diphenhydramine -acetaminophen  (TYLENOL  PM) 25-500 MG TABS Take 1 tablet by mouth at bedtime as needed (sleep).     furosemide  (LASIX ) 40 MG tablet TAKE 1 TABLET BY MOUTH EVERY DAY 90 tablet 1   glimepiride  (AMARYL ) 2 MG tablet TAKE 1 TABLET BY MOUTH EVERY DAY WITH BREAKFAST 90 tablet 1   glucose blood (ONETOUCH VERIO) test strip Check Blood Sugar 3 times daily. 100 strip 4   hyoscyamine  (LEVSIN  SL) 0.125 MG SL tablet Place 1 tablet (0.125 mg total) under the tongue every 6 (six) hours as needed. 30 tablet 1   insulin  glargine (LANTUS  SOLOSTAR) 100 UNIT/ML Solostar Pen Inject 14 Units into the skin every morning.     Insulin  Pen Needle (BD PEN NEEDLE NANO 2ND GEN) 32G X 4 MM MISC USE AS DIRECTED WITH LANTUS  ONCE A DAY. 100 each 3   irbesartan  (AVAPRO ) 75 MG tablet Take 1 tablet (75 mg total) by mouth daily. 90 tablet 3   Lancets (ONETOUCH DELICA PLUS LANCET33G) MISC USE AS DIRECTED TO TEST DAILY 100 each 12   meclizine  (ANTIVERT ) 12.5 MG tablet Take 1 tablet (12.5 mg total) by mouth 3 (three) times daily as needed for dizziness. As needed for   Dizziness or nausea 30 tablet 1   nystatin  cream (MYCOSTATIN ) Apply 1 application topically 2 (  two) times daily. 30 g 1   ondansetron  (ZOFRAN ) 8 MG tablet Take 1 tablet (8 mg total) by mouth every 8 (eight) hours as needed for nausea or vomiting. Start on third day after chemotherapy.  30 tablet 1   prochlorperazine  (COMPAZINE ) 10 MG tablet Take 1 tablet (10 mg total) by mouth every 6 (six) hours as needed for nausea or vomiting (Nausea or vomiting). 30 tablet 1   ramelteon  (ROZEREM ) 8 MG tablet Take 1 tablet (8 mg total) by mouth at bedtime. 30 tablet 1   Vitamin D , Ergocalciferol , (DRISDOL ) 1.25 MG (50000 UNIT) CAPS capsule Take 1 capsule (50,000 Units total) by mouth every 7 (seven) days. TAKE 1 CAPSULE (50,000 UNITS TOTAL) BY MOUTH EVERY 7 (SEVEN) DAYS (Patient taking differently: Take 50,000 Units by mouth every 7 (seven) days. TAKE 1 CAPSULE (50,000 UNITS TOTAL) BY MOUTH EVERY 7 (SEVEN) DAYS - takes on sunday) 5 capsule 4   zolpidem  (AMBIEN ) 5 MG tablet Take 1 tablet (5 mg total) by mouth at bedtime as needed for sleep. 30 tablet 2   No facility-administered medications prior to visit.    No Known Allergies  Review of Systems  Constitutional:  Negative for fever and malaise/fatigue.  HENT:  Negative for congestion.   Eyes:  Negative for blurred vision.  Respiratory:  Negative for shortness of breath.   Cardiovascular:  Negative for chest pain, palpitations and leg swelling.  Gastrointestinal:  Negative for abdominal pain, blood in stool and nausea.  Genitourinary:  Negative for dysuria and frequency.  Musculoskeletal:  Negative for falls.  Skin:  Negative for rash.  Neurological:  Negative for dizziness, loss of consciousness and headaches.  Endo/Heme/Allergies:  Negative for environmental allergies.  Psychiatric/Behavioral:  Negative for depression. The patient is not nervous/anxious.        Objective:    Physical Exam Constitutional:      General: She is not in acute distress.    Appearance: Normal appearance. She is well-developed. She is not toxic-appearing.  HENT:     Head: Normocephalic and atraumatic.     Right Ear: External ear normal.     Left Ear: External ear normal.     Nose: Nose normal.  Eyes:     General:        Right eye: No discharge.         Left eye: No discharge.     Conjunctiva/sclera: Conjunctivae normal.  Neck:     Thyroid : No thyromegaly.  Cardiovascular:     Rate and Rhythm: Normal rate and regular rhythm.     Heart sounds: Normal heart sounds. No murmur heard. Pulmonary:     Effort: Pulmonary effort is normal. No respiratory distress.     Breath sounds: Normal breath sounds.  Abdominal:     General: Bowel sounds are normal.     Palpations: Abdomen is soft.     Tenderness: There is no abdominal tenderness. There is no guarding.  Musculoskeletal:        General: Normal range of motion.     Cervical back: Neck supple.  Lymphadenopathy:     Cervical: No cervical adenopathy.  Skin:    General: Skin is warm and dry.  Neurological:     Mental Status: She is alert and oriented to person, place, and time.  Psychiatric:        Mood and Affect: Mood normal.        Behavior: Behavior normal.        Thought Content: Thought  content normal.        Judgment: Judgment normal.     BP 121/76 Comment: Pt obtained  Pulse 88 Comment: Pt obtained Wt Readings from Last 3 Encounters:  03/21/23 212 lb (96.2 kg)  03/14/23 215 lb (97.5 kg)  03/01/23 215 lb 8 oz (97.8 kg)    Diabetic Foot Exam - Simple   No data filed    Lab Results  Component Value Date   WBC 8.5 03/20/2023   HGB 12.6 03/20/2023   HCT 38.5 03/20/2023   PLT 172 03/20/2023   GLUCOSE 138 (H) 03/20/2023   CHOL 191 02/08/2023   TRIG 275.0 (H) 02/08/2023   HDL 47.40 02/08/2023   LDLDIRECT 71.0 04/11/2022   LDLCALC 89 02/08/2023   ALT 26 03/20/2023   AST 27 03/20/2023   NA 134 (L) 03/20/2023   K 5.3 (H) 03/20/2023   CL 101 03/20/2023   CREATININE 1.54 (H) 03/20/2023   BUN 46 (H) 03/20/2023   CO2 25 03/20/2023   TSH 2.29 02/08/2023   INR 1.1 (H) 03/22/2021   HGBA1C 7.6 (H) 02/08/2023   MICROALBUR <0.7 02/08/2023    Lab Results  Component Value Date   TSH 2.29 02/08/2023   Lab Results  Component Value Date   WBC 8.5 03/20/2023   HGB  12.6 03/20/2023   HCT 38.5 03/20/2023   MCV 100.5 (H) 03/20/2023   PLT 172 03/20/2023   Lab Results  Component Value Date   NA 134 (L) 03/20/2023   K 5.3 (H) 03/20/2023   CHLORIDE 104 11/01/2016   CO2 25 03/20/2023   GLUCOSE 138 (H) 03/20/2023   BUN 46 (H) 03/20/2023   CREATININE 1.54 (H) 03/20/2023   BILITOT 1.0 03/20/2023   ALKPHOS 75 03/20/2023   AST 27 03/20/2023   ALT 26 03/20/2023   PROT 7.0 03/20/2023   ALBUMIN  4.3 03/20/2023   CALCIUM  10.9 (H) 03/20/2023   ANIONGAP 8 03/20/2023   EGFR 50 (L) 11/01/2016   GFR 31.61 (L) 02/08/2023   Lab Results  Component Value Date   CHOL 191 02/08/2023   Lab Results  Component Value Date   HDL 47.40 02/08/2023   Lab Results  Component Value Date   LDLCALC 89 02/08/2023   Lab Results  Component Value Date   TRIG 275.0 (H) 02/08/2023   Lab Results  Component Value Date   CHOLHDL 4 02/08/2023   Lab Results  Component Value Date   HGBA1C 7.6 (H) 02/08/2023       Assessment & Plan:  Chronic systolic heart failure (HCC) Assessment & Plan: No recent exacerbation   Type 2 diabetes mellitus without complication, without long-term current use of insulin  (HCC) Assessment & Plan: hgba1c acceptable, minimize simple carbs. Increase exercise as tolerated. Continue current meds    Chronic gout without tophus, unspecified cause, unspecified site Assessment & Plan: Hydrate and monitor    Hypercalcemia Assessment & Plan: Asymptomatic, continue to monitor   Hyperlipidemia, unspecified hyperlipidemia type Assessment & Plan: Encourage heart healthy diet such as MIND or DASH diet, increase exercise, avoid trans fats, simple carbohydrates and processed foods, consider a krill or fish or flaxseed oil cap daily.     Renal insufficiency Assessment & Plan: Hydrate and monitor    Vitamin D  deficiency Assessment & Plan: Supplement and monitor    Small cell carcinoma of middle lobe of right lung [facility]) Assessment &  Plan: Following with oncology     Assessment and Plan Assessment & Plan Cancer Undergoing treatment with good  tolerance. Advised on hyperkalemia risk and dietary modifications. - Monitor potassium levels. - Advise adequate fluid and protein intake. - Provide reassurance and support.  Follow-up Follow-up scheduled for April. - Schedule follow-up in April. - Encourage contact if issues arise before next appointment.     Harlene Horton, MD "

## 2023-04-04 ENCOUNTER — Encounter: Payer: Self-pay | Admitting: *Deleted

## 2023-04-04 ENCOUNTER — Encounter: Payer: Self-pay | Admitting: Hematology & Oncology

## 2023-04-04 ENCOUNTER — Other Ambulatory Visit: Payer: Self-pay | Admitting: *Deleted

## 2023-04-04 DIAGNOSIS — C342 Malignant neoplasm of middle lobe, bronchus or lung: Secondary | ICD-10-CM

## 2023-04-04 MED ORDER — AMOXICILLIN-POT CLAVULANATE 875-125 MG PO TABS: 1.0000 | ORAL_TABLET | Freq: Two times a day (BID) | ORAL | 0 refills | Status: DC

## 2023-04-04 NOTE — Progress Notes (Signed)
 Patient calling with c/o cold. She has developed upper chest and nasal congestion. She also has a sore throat. She would like to know what she can use to treat symptoms.   Spoke to Dr Myna Hidalgo. She just received cycle one last week. He would like her to start Augmentin BID for seven days and treat symptoms using OTC options.   Called and spoke to the patient. She's aware of new prescription with pharmacy confirmed. Reviewed OTC remedies for symptoms. Also encouraged rest and adequate fluid intake. She did have a recent sick contact with her daughter who had similar symptoms. She is encouraged to call back if she develops fevers.   Oncology Nurse Navigator Documentation     04/04/2023    8:45 AM  Oncology Nurse Navigator Flowsheets  Navigator Follow Up Date: 04/16/2023  Navigator Follow Up Reason: Follow-up Appointment;Chemotherapy  Financial risk analyst Encounter Type Telephone  Telephone Symptom Mgt;Incoming Call  Patient Visit Type MedOnc  Treatment Phase Active Tx  Barriers/Navigation Needs Coordination of Care;Education  Education Pain/ Symptom Management  Interventions Medication Assistance;Education;Psycho-Social Support  Acuity Level 2-Minimal Needs (1-2 Barriers Identified)  Education Method Verbal  Support Groups/Services Friends and Family  Time Spent with Patient 15

## 2023-04-05 ENCOUNTER — Other Ambulatory Visit: Payer: Self-pay | Admitting: Family Medicine

## 2023-04-08 ENCOUNTER — Emergency Department (HOSPITAL_COMMUNITY)

## 2023-04-08 ENCOUNTER — Inpatient Hospital Stay (HOSPITAL_COMMUNITY)
Admission: EM | Admit: 2023-04-08 | Discharge: 2023-04-12 | DRG: 871 | Disposition: A | Discharge status: Home Health | Attending: Internal Medicine | Admitting: Internal Medicine

## 2023-04-08 ENCOUNTER — Encounter (HOSPITAL_COMMUNITY): Payer: Self-pay | Admitting: Internal Medicine

## 2023-04-08 ENCOUNTER — Other Ambulatory Visit: Payer: Self-pay

## 2023-04-08 DIAGNOSIS — Z818 Family history of other mental and behavioral disorders: Secondary | ICD-10-CM

## 2023-04-08 DIAGNOSIS — E119 Type 2 diabetes mellitus without complications: Principal | ICD-10-CM

## 2023-04-08 DIAGNOSIS — R4182 Altered mental status, unspecified: Secondary | ICD-10-CM

## 2023-04-08 DIAGNOSIS — Z823 Family history of stroke: Secondary | ICD-10-CM

## 2023-04-08 DIAGNOSIS — E11649 Type 2 diabetes mellitus with hypoglycemia without coma: Secondary | ICD-10-CM | POA: Diagnosis present

## 2023-04-08 DIAGNOSIS — K529 Noninfective gastroenteritis and colitis, unspecified: Secondary | ICD-10-CM | POA: Diagnosis present

## 2023-04-08 DIAGNOSIS — K219 Gastro-esophageal reflux disease without esophagitis: Secondary | ICD-10-CM | POA: Diagnosis present

## 2023-04-08 DIAGNOSIS — Z8249 Family history of ischemic heart disease and other diseases of the circulatory system: Secondary | ICD-10-CM

## 2023-04-08 DIAGNOSIS — K859 Acute pancreatitis without necrosis or infection, unspecified: Secondary | ICD-10-CM | POA: Diagnosis present

## 2023-04-08 DIAGNOSIS — L89156 Pressure-induced deep tissue damage of sacral region: Secondary | ICD-10-CM | POA: Diagnosis present

## 2023-04-08 DIAGNOSIS — N1832 Chronic kidney disease, stage 3b: Secondary | ICD-10-CM | POA: Diagnosis not present

## 2023-04-08 DIAGNOSIS — A419 Sepsis, unspecified organism: Secondary | ICD-10-CM | POA: Diagnosis not present

## 2023-04-08 DIAGNOSIS — R4781 Slurred speech: Secondary | ICD-10-CM | POA: Diagnosis not present

## 2023-04-08 DIAGNOSIS — R6521 Severe sepsis with septic shock: Secondary | ICD-10-CM | POA: Diagnosis not present

## 2023-04-08 DIAGNOSIS — D630 Anemia in neoplastic disease: Secondary | ICD-10-CM | POA: Diagnosis present

## 2023-04-08 DIAGNOSIS — E860 Dehydration: Secondary | ICD-10-CM | POA: Diagnosis present

## 2023-04-08 DIAGNOSIS — C3490 Malignant neoplasm of unspecified part of unspecified bronchus or lung: Secondary | ICD-10-CM | POA: Diagnosis not present

## 2023-04-08 DIAGNOSIS — K7460 Unspecified cirrhosis of liver: Secondary | ICD-10-CM | POA: Diagnosis present

## 2023-04-08 DIAGNOSIS — E861 Hypovolemia: Secondary | ICD-10-CM | POA: Diagnosis present

## 2023-04-08 DIAGNOSIS — K5792 Diverticulitis of intestine, part unspecified, without perforation or abscess without bleeding: Secondary | ICD-10-CM | POA: Diagnosis not present

## 2023-04-08 DIAGNOSIS — K8689 Other specified diseases of pancreas: Secondary | ICD-10-CM | POA: Diagnosis present

## 2023-04-08 DIAGNOSIS — E871 Hypo-osmolality and hyponatremia: Secondary | ICD-10-CM | POA: Diagnosis present

## 2023-04-08 DIAGNOSIS — J9811 Atelectasis: Secondary | ICD-10-CM | POA: Diagnosis not present

## 2023-04-08 DIAGNOSIS — Z800 Family history of malignant neoplasm of digestive organs: Secondary | ICD-10-CM

## 2023-04-08 DIAGNOSIS — I129 Hypertensive chronic kidney disease with stage 1 through stage 4 chronic kidney disease, or unspecified chronic kidney disease: Secondary | ICD-10-CM | POA: Diagnosis present

## 2023-04-08 DIAGNOSIS — K2980 Duodenitis without bleeding: Secondary | ICD-10-CM | POA: Diagnosis present

## 2023-04-08 DIAGNOSIS — Z1152 Encounter for screening for COVID-19: Secondary | ICD-10-CM

## 2023-04-08 DIAGNOSIS — K5730 Diverticulosis of large intestine without perforation or abscess without bleeding: Secondary | ICD-10-CM | POA: Diagnosis not present

## 2023-04-08 DIAGNOSIS — E1122 Type 2 diabetes mellitus with diabetic chronic kidney disease: Secondary | ICD-10-CM | POA: Diagnosis present

## 2023-04-08 DIAGNOSIS — E161 Other hypoglycemia: Secondary | ICD-10-CM | POA: Diagnosis not present

## 2023-04-08 DIAGNOSIS — R29818 Other symptoms and signs involving the nervous system: Secondary | ICD-10-CM | POA: Diagnosis not present

## 2023-04-08 DIAGNOSIS — E1165 Type 2 diabetes mellitus with hyperglycemia: Secondary | ICD-10-CM | POA: Diagnosis not present

## 2023-04-08 DIAGNOSIS — D6959 Other secondary thrombocytopenia: Secondary | ICD-10-CM | POA: Diagnosis present

## 2023-04-08 DIAGNOSIS — C342 Malignant neoplasm of middle lobe, bronchus or lung: Secondary | ICD-10-CM

## 2023-04-08 DIAGNOSIS — Z833 Family history of diabetes mellitus: Secondary | ICD-10-CM

## 2023-04-08 DIAGNOSIS — Z794 Long term (current) use of insulin: Secondary | ICD-10-CM | POA: Diagnosis not present

## 2023-04-08 DIAGNOSIS — I213 ST elevation (STEMI) myocardial infarction of unspecified site: Secondary | ICD-10-CM | POA: Diagnosis not present

## 2023-04-08 DIAGNOSIS — T451X5A Adverse effect of antineoplastic and immunosuppressive drugs, initial encounter: Secondary | ICD-10-CM | POA: Diagnosis present

## 2023-04-08 DIAGNOSIS — Z825 Family history of asthma and other chronic lower respiratory diseases: Secondary | ICD-10-CM

## 2023-04-08 DIAGNOSIS — R579 Shock, unspecified: Secondary | ICD-10-CM | POA: Diagnosis present

## 2023-04-08 DIAGNOSIS — Z85528 Personal history of other malignant neoplasm of kidney: Secondary | ICD-10-CM

## 2023-04-08 DIAGNOSIS — R0902 Hypoxemia: Secondary | ICD-10-CM | POA: Diagnosis not present

## 2023-04-08 DIAGNOSIS — Z905 Acquired absence of kidney: Secondary | ICD-10-CM

## 2023-04-08 DIAGNOSIS — Z7982 Long term (current) use of aspirin: Secondary | ICD-10-CM

## 2023-04-08 DIAGNOSIS — E669 Obesity, unspecified: Secondary | ICD-10-CM | POA: Diagnosis not present

## 2023-04-08 DIAGNOSIS — Z860100 Personal history of colon polyps, unspecified: Secondary | ICD-10-CM

## 2023-04-08 DIAGNOSIS — Z90710 Acquired absence of both cervix and uterus: Secondary | ICD-10-CM

## 2023-04-08 DIAGNOSIS — E162 Hypoglycemia, unspecified: Secondary | ICD-10-CM | POA: Diagnosis not present

## 2023-04-08 DIAGNOSIS — I864 Gastric varices: Secondary | ICD-10-CM | POA: Diagnosis not present

## 2023-04-08 DIAGNOSIS — Z87891 Personal history of nicotine dependence: Secondary | ICD-10-CM

## 2023-04-08 DIAGNOSIS — J069 Acute upper respiratory infection, unspecified: Secondary | ICD-10-CM | POA: Diagnosis present

## 2023-04-08 DIAGNOSIS — K5732 Diverticulitis of large intestine without perforation or abscess without bleeding: Secondary | ICD-10-CM | POA: Diagnosis present

## 2023-04-08 DIAGNOSIS — Z7984 Long term (current) use of oral hypoglycemic drugs: Secondary | ICD-10-CM

## 2023-04-08 DIAGNOSIS — C7951 Secondary malignant neoplasm of bone: Secondary | ICD-10-CM | POA: Diagnosis not present

## 2023-04-08 DIAGNOSIS — G9341 Metabolic encephalopathy: Secondary | ICD-10-CM | POA: Diagnosis not present

## 2023-04-08 DIAGNOSIS — Z6839 Body mass index (BMI) 39.0-39.9, adult: Secondary | ICD-10-CM

## 2023-04-08 DIAGNOSIS — Z79899 Other long term (current) drug therapy: Secondary | ICD-10-CM

## 2023-04-08 DIAGNOSIS — J189 Pneumonia, unspecified organism: Secondary | ICD-10-CM | POA: Diagnosis present

## 2023-04-08 DIAGNOSIS — R197 Diarrhea, unspecified: Secondary | ICD-10-CM

## 2023-04-08 DIAGNOSIS — K5900 Constipation, unspecified: Secondary | ICD-10-CM | POA: Diagnosis present

## 2023-04-08 LAB — COMPREHENSIVE METABOLIC PANEL
ALT: 47 U/L — ABNORMAL HIGH (ref 0–44)
AST: 47 U/L — ABNORMAL HIGH (ref 15–41)
Albumin: 2.4 g/dL — ABNORMAL LOW (ref 3.5–5.0)
Alkaline Phosphatase: 85 U/L (ref 38–126)
Anion gap: 13 (ref 5–15)
BUN: 56 mg/dL — ABNORMAL HIGH (ref 8–23)
CO2: 18 mmol/L — ABNORMAL LOW (ref 22–32)
Calcium: 9.3 mg/dL (ref 8.9–10.3)
Chloride: 100 mmol/L (ref 98–111)
Creatinine, Ser: 1.64 mg/dL — ABNORMAL HIGH (ref 0.44–1.00)
GFR, Estimated: 32 mL/min — ABNORMAL LOW (ref >60)
Glucose, Bld: 152 mg/dL — ABNORMAL HIGH (ref 70–99)
Potassium: 3.2 mmol/L — ABNORMAL LOW (ref 3.5–5.1)
Sodium: 131 mmol/L — ABNORMAL LOW (ref 135–145)
Total Bilirubin: 0.6 mg/dL (ref 0.0–1.2)
Total Protein: 5.3 g/dL — ABNORMAL LOW (ref 6.5–8.1)

## 2023-04-08 LAB — LIPASE, BLOOD: Lipase: 62 U/L — ABNORMAL HIGH (ref 11–51)

## 2023-04-08 LAB — CBC WITH DIFFERENTIAL/PLATELET
Abs Immature Granulocytes: 1.37 10*3/uL — ABNORMAL HIGH (ref 0.00–0.07)
Basophils Absolute: 0 10*3/uL (ref 0.0–0.1)
Basophils Relative: 0 %
Eosinophils Absolute: 0 10*3/uL (ref 0.0–0.5)
Eosinophils Relative: 0 %
HCT: 30.5 % — ABNORMAL LOW (ref 36.0–46.0)
Hemoglobin: 10.4 g/dL — ABNORMAL LOW (ref 12.0–15.0)
Immature Granulocytes: 10 %
Lymphocytes Relative: 14 %
Lymphs Abs: 2 10*3/uL (ref 0.7–4.0)
MCH: 33.3 pg (ref 26.0–34.0)
MCHC: 34.1 g/dL (ref 30.0–36.0)
MCV: 97.8 fL (ref 80.0–100.0)
Monocytes Absolute: 1.2 10*3/uL — ABNORMAL HIGH (ref 0.1–1.0)
Monocytes Relative: 9 %
Neutro Abs: 9.6 10*3/uL — ABNORMAL HIGH (ref 1.7–7.7)
Neutrophils Relative %: 67 %
Platelets: 41 10*3/uL — ABNORMAL LOW (ref 150–400)
RBC: 3.12 MIL/uL — ABNORMAL LOW (ref 3.87–5.11)
RDW: 13.9 % (ref 11.5–15.5)
Smear Review: NORMAL
WBC: 14.2 10*3/uL — ABNORMAL HIGH (ref 4.0–10.5)
nRBC: 0 % (ref 0.0–0.2)

## 2023-04-08 LAB — RESPIRATORY PANEL BY PCR

## 2023-04-08 LAB — URINALYSIS, ROUTINE W REFLEX MICROSCOPIC
Bilirubin Urine: NEGATIVE
Glucose, UA: NEGATIVE mg/dL
Hgb urine dipstick: NEGATIVE
Ketones, ur: NEGATIVE mg/dL
Leukocytes,Ua: NEGATIVE
Nitrite: NEGATIVE
Protein, ur: NEGATIVE mg/dL
Specific Gravity, Urine: 1.009 (ref 1.005–1.030)
pH: 5 (ref 5.0–8.0)

## 2023-04-08 LAB — CBG MONITORING, ED
Glucose-Capillary: 120 mg/dL — ABNORMAL HIGH (ref 70–99)
Glucose-Capillary: 113 mg/dL — ABNORMAL HIGH (ref 70–99)
Glucose-Capillary: <10 mg/dL — CL (ref 70–99)
Glucose-Capillary: 119 mg/dL — ABNORMAL HIGH (ref 70–99)
Glucose-Capillary: 98 mg/dL (ref 70–99)
Glucose-Capillary: 117 mg/dL — ABNORMAL HIGH (ref 70–99)
Glucose-Capillary: 127 mg/dL — ABNORMAL HIGH (ref 70–99)

## 2023-04-08 LAB — RESP PANEL BY RT-PCR (RSV, FLU A&B, COVID)  RVPGX2
Influenza A by PCR: NEGATIVE
Influenza B by PCR: NEGATIVE
Resp Syncytial Virus by PCR: NEGATIVE
SARS Coronavirus 2 by RT PCR: NEGATIVE

## 2023-04-08 LAB — I-STAT CG4 LACTIC ACID, ED
Lactic Acid, Venous: 2.4 mmol/L (ref 0.5–1.9)
Lactic Acid, Venous: 1.8 mmol/L (ref 0.5–1.9)

## 2023-04-08 LAB — MRSA NEXT GEN BY PCR, NASAL: MRSA by PCR Next Gen: NOT DETECTED

## 2023-04-08 LAB — GLUCOSE, CAPILLARY
Glucose-Capillary: 126 mg/dL — ABNORMAL HIGH (ref 70–99)
Glucose-Capillary: 157 mg/dL — ABNORMAL HIGH (ref 70–99)
Glucose-Capillary: 165 mg/dL — ABNORMAL HIGH (ref 70–99)

## 2023-04-08 LAB — CORTISOL: Cortisol, Plasma: 24.1 ug/dL

## 2023-04-08 LAB — STREP PNEUMONIAE URINARY ANTIGEN: Strep Pneumo Urinary Antigen: NEGATIVE

## 2023-04-08 MED ORDER — PIPERACILLIN-TAZOBACTAM 3.375 G IVPB
3.3750 g | Freq: Three times a day (TID) | INTRAVENOUS | Status: DC
  Administered 2023-04-08 – 2023-04-12 (×11): 3.375 g via INTRAVENOUS
  Filled 2023-04-08 (×11): qty 50

## 2023-04-08 MED ORDER — VANCOMYCIN HCL 2000 MG/400ML IV SOLN
2000.0000 mg | Freq: Once | INTRAVENOUS | Status: AC
Start: 2023-04-08 — End: 2023-04-08
  Administered 2023-04-08: 2000 mg via INTRAVENOUS
  Filled 2023-04-08: qty 400

## 2023-04-08 MED ORDER — PIPERACILLIN-TAZOBACTAM 3.375 G IVPB 30 MIN
3.3750 g | Freq: Once | INTRAVENOUS | Status: AC
  Administered 2023-04-08: 3.375 g via INTRAVENOUS
  Filled 2023-04-08: qty 50

## 2023-04-08 MED ORDER — ACETAMINOPHEN 325 MG PO TABS
650.0000 mg | ORAL_TABLET | Freq: Four times a day (QID) | ORAL | Status: DC | PRN
Start: 2023-04-08 — End: 2023-04-12
  Administered 2023-04-08 – 2023-04-10 (×3): 650 mg via ORAL
  Filled 2023-04-08 (×3): qty 2

## 2023-04-08 MED ORDER — LACTATED RINGERS IV SOLN
INTRAVENOUS | Status: DC
Start: 2023-04-08 — End: 2023-04-09

## 2023-04-08 MED ORDER — POLYETHYLENE GLYCOL 3350 17 G PO PACK: 17.0000 g | PACK | Freq: Every day | ORAL | Status: DC | PRN

## 2023-04-08 MED ORDER — METRONIDAZOLE 500 MG/100ML IV SOLN
500.0000 mg | Freq: Once | INTRAVENOUS | Status: DC
  Administered 2023-04-08: 500 mg via INTRAVENOUS
  Filled 2023-04-08: qty 100

## 2023-04-08 MED ORDER — VANCOMYCIN HCL 2000 MG/400ML IV SOLN: 2000.0000 mg | INTRAVENOUS | Status: DC

## 2023-04-08 MED ORDER — SODIUM CHLORIDE 0.9 % IV SOLN: 250.0000 mL | INTRAVENOUS | Status: DC

## 2023-04-08 MED ORDER — ZOLPIDEM TARTRATE 5 MG PO TABS
5.0000 mg | ORAL_TABLET | Freq: Every evening | ORAL | Status: DC | PRN
Start: 2023-04-08 — End: 2023-04-11
  Filled 2023-04-08: qty 1

## 2023-04-08 MED ORDER — INSULIN ASPART 100 UNIT/ML IJ SOLN: 0.0000 [IU] | Freq: Three times a day (TID) | INTRAMUSCULAR | Status: DC

## 2023-04-08 MED ORDER — NOREPINEPHRINE 4 MG/250ML-% IV SOLN
INTRAVENOUS | Status: AC
  Filled 2023-04-08: qty 250

## 2023-04-08 MED ORDER — SODIUM CHLORIDE 0.9 % IV BOLUS
1000.0000 mL | Freq: Once | INTRAVENOUS | Status: AC
  Administered 2023-04-08: 1000 mL via INTRAVENOUS

## 2023-04-08 MED ORDER — POTASSIUM CHLORIDE CRYS ER 20 MEQ PO TBCR
40.0000 meq | EXTENDED_RELEASE_TABLET | Freq: Once | ORAL | Status: AC
  Administered 2023-04-08: 40 meq via ORAL
  Filled 2023-04-08: qty 2

## 2023-04-08 MED ORDER — SODIUM CHLORIDE 0.9 % IV SOLN
100.0000 mg | Freq: Two times a day (BID) | INTRAVENOUS | Status: AC
  Administered 2023-04-08 – 2023-04-09 (×3): 100 mg via INTRAVENOUS
  Filled 2023-04-08 (×4): qty 100

## 2023-04-08 MED ORDER — IOHEXOL 350 MG/ML SOLN
60.0000 mL | Freq: Once | INTRAVENOUS | Status: AC | PRN
  Administered 2023-04-08: 60 mL via INTRAVENOUS

## 2023-04-08 MED ORDER — PANTOPRAZOLE SODIUM 40 MG IV SOLR
40.0000 mg | Freq: Every day | INTRAVENOUS | Status: DC
  Administered 2023-04-08 – 2023-04-10 (×3): 40 mg via INTRAVENOUS
  Filled 2023-04-08 (×3): qty 10

## 2023-04-08 MED ORDER — SODIUM CHLORIDE 0.9 % IV BOLUS
500.0000 mL | Freq: Once | INTRAVENOUS | Status: AC
  Administered 2023-04-08: 500 mL via INTRAVENOUS

## 2023-04-08 MED ORDER — NOREPINEPHRINE 4 MG/250ML-% IV SOLN
2.0000 ug/min | INTRAVENOUS | Status: DC
  Administered 2023-04-08: 2 ug/min via INTRAVENOUS

## 2023-04-08 MED ORDER — DEXTROSE 10 % IV SOLN
INTRAVENOUS | Status: DC
  Filled 2023-04-08: qty 1000

## 2023-04-08 MED ORDER — DEXTROSE 50 % IV SOLN
1.0000 | Freq: Once | INTRAVENOUS | Status: AC
  Administered 2023-04-08: 50 mL via INTRAVENOUS
  Filled 2023-04-08: qty 50

## 2023-04-08 MED ORDER — SODIUM CHLORIDE 0.9 % IV SOLN
2.0000 g | Freq: Once | INTRAVENOUS | Status: DC
  Administered 2023-04-08: 2 g via INTRAVENOUS
  Filled 2023-04-08: qty 12.5

## 2023-04-08 MED ORDER — ORAL CARE MOUTH RINSE: 15.0000 mL | OROMUCOSAL | Status: DC | PRN

## 2023-04-08 MED ORDER — DOCUSATE SODIUM 100 MG PO CAPS: 100.0000 mg | ORAL_CAPSULE | Freq: Two times a day (BID) | ORAL | Status: DC | PRN

## 2023-04-08 MED ORDER — CHLORHEXIDINE GLUCONATE CLOTH 2 % EX PADS
6.0000 | MEDICATED_PAD | Freq: Every day | CUTANEOUS | Status: DC
  Administered 2023-04-08 – 2023-04-12 (×5): 6 via TOPICAL

## 2023-04-08 NOTE — Progress Notes (Signed)
 eLink Physician-Brief Progress Note Patient Name: Amanda Crawford DOB: 1945-04-25 MRN: [id number]   Date of Service  04/08/2023  HPI/Events of Note  78 year old woman w/ hx of small cell lung cancer recently started on chemo, prior renal cell cancer w isolated met s/p left lung wedge resection for met and nephrectomy, CKD, IDDM, chronic GI issues presenting with increasing confusion and GI upset   Requesting Tylenol for back pain.  eICU Interventions  Ordered   [phone] -now showing evidence of hyperglycemia with CBGs in the 160s.  Currently running D10 at 80 cc/h.  Will reduce the dose to 40 cc and continue monitoring.  May be able to wean off but likely still needs dextrose infusion.  Intervention Category Minor Interventions: Routine modifications to care plan (e.g. PRN medications for pain, fever)  Luna Audia 04/08/2023, 9:19 PM

## 2023-04-08 NOTE — ED Notes (Signed)
 Patient transported to CT

## 2023-04-08 NOTE — Progress Notes (Signed)
 ED Pharmacy Antibiotic Sign Off An antibiotic consult was received from an ED provider for vancomycin and zosyn per pharmacy dosing for sepsis. A chart review was completed to assess appropriateness.   The following one time order(s) were placed:  Zosyn 3.375g Vancomycin 2g  Further antibiotic and/or antibiotic pharmacy consults should be ordered by the admitting provider if indicated.   Thank you for allowing pharmacy to be a part of this patient's care.   Marja Kays, [facility]  Clinical Pharmacist 04/08/23 4:08 AM

## 2023-04-08 NOTE — ED Notes (Signed)
 Pt assisted to bedside commode and back to bed without incident. Pt weak, but able to stand a pivot with minimal assistance.

## 2023-04-08 NOTE — ED Provider Notes (Signed)
 [place] EMERGENCY DEPARTMENT AT [facility] Provider Note   CSN: [id number] Arrival date & time: 04/08/23  0106     History  Chief Complaint  Patient presents with   Hypoglycemia    Amanda Crawford is a 78 y.o. female.  With a history of type 2 diabetes, lung cancer on chemotherapy (small cell carcinoma), renal cell carcinoma and left bundle branch block who presents to the ED for altered mental status.  Patient recently treated for community-acquired pneumonia with amoxicillin.  She is experienced cough and congestion over the last 2 days.  She was also found to have a UTI recently.  Has taken amoxicillin for 2 days.  Family noted her to be exhibiting signs of slurred speech and global confusion.  Hypoglycemic at home with improvement in her mental status after receiving oral glucose and glucagon from EMS.  Takes nightly insulin but did not take her insulin tonight.  Patient has also noted hypotension at home on recent blood pressure monitoring.  She feels well now without chest pain shortness of breath nausea vomiting fevers or chills.   Hypoglycemia      Home Medications Prior to Admission medications   Medication Sig Start Date End Date Taking? Authorizing Provider  allopurinol (ZYLOPRIM) 300 MG tablet TAKE 1 TABLET BY MOUTH EVERY DAY 03/15/23   [name], MD  amoxicillin-clavulanate (AUGMENTIN) 875-125 MG tablet Take 1 tablet by mouth 2 (two) times daily. 04/04/23   Josph Macho, MD  aspirin EC 81 MG tablet Take 81 mg by mouth daily.    [provider]  atorvastatin (LIPITOR) 20 MG tablet Take 1 tablet (20 mg total) by mouth daily. 06/22/22   [name], MD  Calcium Carb-Cholecalciferol (CALCIUM 600 + D PO) Take 1 tablet by mouth daily.    [provider]  carvedilol (COREG) 3.125 MG tablet Take 0.5 tablets (1.5625 mg total) by mouth 2 (two) times daily with a meal. 02/20/23   Byrum, Les Pou, MD  dexamethasone (DECADRON) 4 MG tablet Take 2  tablets daily for 2 days, on days 4 and 5.Take with food. Every 21 days. 03/14/23   Josph Macho, MD  diphenhydramine-acetaminophen (TYLENOL PM) 25-500 MG TABS Take 1 tablet by mouth at bedtime as needed (sleep).    [provider]  furosemide (LASIX) 40 MG tablet TAKE 1 TABLET BY MOUTH EVERY DAY 03/14/23   Hilty, Lisette Abu, MD  glimepiride (AMARYL) 2 MG tablet TAKE 1 TABLET BY MOUTH EVERY DAY WITH BREAKFAST 02/08/23   [name], MD  glucose blood (ONETOUCH VERIO) test strip Check Blood Sugar 3 times daily. 06/08/22   [name], MD  hyoscyamine (LEVSIN SL) 0.125 MG SL tablet Place 1 tablet (0.125 mg total) under the tongue every 6 (six) hours as needed. 10/14/20   [name], MD  insulin glargine (LANTUS SOLOSTAR) 100 UNIT/ML Solostar Pen Inject 14 Units into the skin every morning. 02/20/23   Byrum, Les Pou, MD  Insulin Pen Needle (BD PEN NEEDLE NANO 2ND GEN) 32G X 4 MM MISC USE AS DIRECTED WITH LANTUS ONCE A DAY. 11/20/22   [name], MD  irbesartan (AVAPRO) 75 MG tablet Take 1 tablet (75 mg total) by mouth daily. 04/25/22   [name], MD  Lancets [facility] DELICA PLUS LANCET33G) MISC USE AS DIRECTED TO TEST DAILY 09/11/22   [name], MD  meclizine (ANTIVERT) 12.5 MG tablet Take 1 tablet (12.5 mg total) by mouth  3 (three) times daily as needed for dizziness. As needed for   Dizziness or nausea 11/23/14   [name], MD  nystatin cream (MYCOSTATIN) Apply 1 application topically 2 (two) times daily. 10/18/20   [name], MD  ondansetron (ZOFRAN) 8 MG tablet Take 1 tablet (8 mg total) by mouth every 8 (eight) hours as needed for nausea or vomiting. Start on third day after chemotherapy. 03/14/23   Josph Macho, MD  prochlorperazine (COMPAZINE) 10 MG tablet Take 1 tablet (10 mg total) by mouth every 6 (six) hours as needed for nausea or vomiting (Nausea or vomiting). 03/14/23   Josph Macho, MD  ramelteon (ROZEREM) 8 MG tablet Take 1 tablet (8 mg  total) by mouth at bedtime. 11/28/22   Olalere, Minna Antis, MD  Vitamin D, Ergocalciferol, (DRISDOL) 1.25 MG (50000 UNIT) CAPS capsule TAKE 1 CAPSULE (50,000 UNITS TOTAL) BY MOUTH EVERY 7 (SEVEN) DAYS. 04/05/23   Worthy Rancher B, FNP  zolpidem (AMBIEN) 5 MG tablet Take 1 tablet (5 mg total) by mouth at bedtime as needed for sleep. 02/13/23   Tomma Lightning, MD  glyBURIDE-metformin (GLUCOVANCE) 1.25-250 MG tablet Take 1 tablet by mouth 2 (two) times daily with a meal. 03/25/19 03/25/19  [name], MD      Allergies    Patient has no known allergies.    Review of Systems   Review of Systems  Physical Exam Updated Vital Signs BP (!) 108/55   Pulse 76   Temp 97.6 F (36.4 C) (Oral)   Resp 20   Ht 5\' 4"  (1.626 m)   Wt 96.2 kg   SpO2 100%   BMI 36.39 kg/m  Physical Exam Vitals and nursing note reviewed.  HENT:     Head: Normocephalic and atraumatic.  Eyes:     Pupils: Pupils are equal, round, and reactive to light.  Cardiovascular:     Rate and Rhythm: Normal rate and regular rhythm.  Pulmonary:     Effort: Pulmonary effort is normal.     Breath sounds: Normal breath sounds.  Abdominal:     Palpations: Abdomen is soft.     Tenderness: There is no abdominal tenderness.  Skin:    General: Skin is warm and dry.  Neurological:     General: No focal deficit present.     Mental Status: She is alert and oriented to person, place, and time.     Sensory: No sensory deficit.     Motor: No weakness.  Psychiatric:        Mood and Affect: Mood normal.     ED Results / Procedures / Treatments   Labs (all labs ordered are listed, but only abnormal results are displayed) Labs Reviewed  COMPREHENSIVE METABOLIC PANEL - Abnormal; Notable for the following components:      Result Value   Sodium 131 (*)    Potassium 3.2 (*)    CO2 18 (*)    Glucose, Bld 152 (*)    BUN 56 (*)    Creatinine, Ser 1.64 (*)    Total Protein 5.3 (*)    Albumin 2.4 (*)    AST 47 (*)    ALT 47 (*)     GFR, Estimated 32 (*)    All other components within normal limits  CBC WITH DIFFERENTIAL/PLATELET - Abnormal; Notable for the following components:   WBC 14.2 (*)    RBC 3.12 (*)    Hemoglobin 10.4 (*)    HCT 30.5 (*)  Platelets 41 (*)    Neutro Abs 9.6 (*)    Monocytes Absolute 1.2 (*)    Abs Immature Granulocytes 1.37 (*)    All other components within normal limits  CBG MONITORING, ED - Abnormal; Notable for the following components:   Glucose-Capillary 120 (*)    All other components within normal limits  I-STAT CG4 LACTIC ACID, ED - Abnormal; Notable for the following components:   Lactic Acid, Venous 2.4 (*)    All other components within normal limits  CBG MONITORING, ED - Abnormal; Notable for the following components:   Glucose-Capillary 113 (*)    All other components within normal limits  RESP PANEL BY RT-PCR (RSV, FLU A&B, COVID)  RVPGX2  CULTURE, BLOOD (ROUTINE X 2)  CULTURE, BLOOD (ROUTINE X 2)  URINALYSIS, ROUTINE W REFLEX MICROSCOPIC  I-STAT CG4 LACTIC ACID, ED    EKG None  Radiology CT Head Wo Contrast Result Date: 04/08/2023 CLINICAL DATA:  Neuro deficit, acute, stroke suspected EXAM: CT HEAD WITHOUT CONTRAST TECHNIQUE: Contiguous axial images were obtained from the base of the skull through the vertex without intravenous contrast. RADIATION DOSE REDUCTION: This exam was performed according to the departmental dose-optimization program which includes automated exposure control, adjustment of the mA and/or kV according to patient size and/or use of iterative reconstruction technique. COMPARISON:  MRI head March 10, 2023. FINDINGS: Brain: Small probable meningioma better evaluated on recent MRI head. No evidence of acute large vascular territory infarct, acute hemorrhage, mass lesion or midline shift. Patchy white matter hypodensities are compatible with chronic microvascular ischemic change. Vascular: No hyperdense vessel. Skull: No acute fracture.  Sinuses/Orbits: Mild paranasal sinus mucosal thickening. No acute orbital findings. Other: No mastoid effusions. IMPRESSION: No evidence of acute intracranial abnormality. Electronically Signed   By: Feliberto Harts M.D.   On: 04/08/2023 04:10   DG Chest Portable 1 View Result Date: 04/08/2023 CLINICAL DATA:  Hypoglycemia EXAM: PORTABLE CHEST 1 VIEW COMPARISON:  03/02/2023 FINDINGS: Lungs are symmetrically expanded. Linear atelectasis at the right lung base. No pneumothorax or pleural effusion. Interval right internal jugular chest port placement with its tip within the superior vena cava. A a cardiac size within normal limits. Pulmonary vascularity is normal. Osseous structures are age-appropriate. No acute bone abnormality. IMPRESSION: 1. Right basilar atelectasis. Electronically Signed   By: Helyn Numbers M.D.   On: 04/08/2023 01:51    Procedures Procedures    Medications Ordered in ED Medications  vancomycin (VANCOREADY) IVPB 2000 mg/400 mL (2,000 mg Intravenous New Bag/Given 04/08/23 0534)  sodium chloride 0.9 % bolus 1,000 mL (0 mLs Intravenous Stopped 04/08/23 0458)  piperacillin-tazobactam (ZOSYN) IVPB 3.375 g (0 g Intravenous Stopped 04/08/23 0507)  sodium chloride 0.9 % bolus 1,000 mL (1,000 mLs Intravenous New Bag/Given 04/08/23 0533)    ED Course/ Medical Decision Making/ A&P Clinical Course as of 04/08/23 1610  Wynelle Link Apr 08, 2023  0631 Laboratory workup noted for leukocytosis of 14.2.  Chest x-ray shows no obvious focal consolidation but she has had recent respiratory symptoms.  UA is negative.  COVID RSV influenza all negative.  Unclear cause of suspected infection next Patient now mentions that she has had increased lower abdominal pain over the last 2 days as well which feels similar to prior episodes of diverticulitis.  Will obtain CT abdomen pelvis to look for diverticulitis and other potential intra-abdominal source of infection along with CT chest to better evaluate for occult  pneumonia [MP]  0651 I, Estelle June DO, am transitioning care  of this patient to the oncoming provider pending CT chest abdomen pelvis, reevaluation and likely admission [MP]    Clinical Course User Index [MP] Royanne Foots, DO                                 Medical Decision Making 78 year old female with history as above presenting given concern for altered mental status.  Noted to be hypoglycemic with EMS.  Slurred speech and global confusion which improved after glucagon and oral glucose.  Remains neurologically intact.  Afebrile and slightly hypotensive here.  Recently treated for pneumonia and found to have UTI.  Considering altered mental status and hypotension will evaluate for potential infectious causes such as UTI and pneumonia and obtain infectious workup.  Will also obtain CT head to look for evidence of stroke or intracranial lesion in the setting of known lung cancer, although neurologic symptoms most likely due to hypoglycemia based on clinical history.  Will provide IV fluids for rehydration and continue to monitor hemodynamic status  Amount and/or Complexity of Data Reviewed Labs: ordered. Radiology: ordered.  Risk Prescription drug management.           Final Clinical Impression(s) / ED Diagnoses Final diagnoses:  Hypoglycemia  Altered mental status, unspecified altered mental status type    Rx / DC Orders ED Discharge Orders     None         Royanne Foots, DO 04/08/23 [phone]

## 2023-04-08 NOTE — ED Provider Notes (Signed)
 Patient was initially seen by Dr. Elayne Snare.  Patient presented for hypoglycemia.  Patient noted to have leukocytosis.  She is pending CT chest abdomen pelvis.  8:04 AM patient noted to have recurrent hypoglycemia.  Will administer another amp of D50.  Will start patient on D10 infusion.  Will continue to monitor blood sugar closely 8:49 AM patient's mental status improved after dextrose infusion.  CT scan has been performed.  Will continue to monitor closely. 9:51 AM blood pressure has decreased into the 80s.  Additional fluids been ordered will add on peripheral pressors.  CT scan does show evidence of diverticulitis.  Presentation concerning for the possibility of evolving sepsis.  Will consult with critical care service for admission  Case discussed with [facility]  .Critical Care  Performed by: Linwood Dibbles, MD Authorized by: Linwood Dibbles, MD   Critical care provider statement:    Critical care time (minutes):  30   Critical care was time spent personally by me on the following activities:  Development of treatment plan with patient or surrogate, discussions with consultants, evaluation of patient's response to treatment, examination of patient, ordering and review of laboratory studies, ordering and review of radiographic studies, ordering and performing treatments and interventions, pulse oximetry, re-evaluation of patient's condition and review of old charts      Linwood Dibbles, MD 04/08/23 1011

## 2023-04-08 NOTE — ED Triage Notes (Addendum)
 Pt arrived via [facility] from home for AMS, slurred speech noted by family. Upon EMS arrival, BS noted to be 33, 2 doses of 15g Oral glucose given, 1mg  glucagon IM. Improvement in BS to 149 with improvement in symptoms, speech clear, alert and oriented. DM 2. Pt hypotensive in route. Pt self checking BP at home reporting hypotension the last 5 days. EMS administered 250cc NS via 20g LAC.  Pt reporting cold and flu symptoms since Friday (weakness, chills, sore throat, diarrhea, nausea,fatigue), currently being treated for UTI with amoxicillin.   Current chemotherapy pt for lung cancer.   EMS sent EKG with concerned findings, reviewed by MD. Pt denies cardiac history, chest pain.  PTA EMS Vitals HR 90 SPO2 94-95% RR 16 BP 86/P CBG 33 -> 149

## 2023-04-08 NOTE — Progress Notes (Signed)
 Pharmacy Antibiotic Note  Amanda Crawford is a 78 y.o. female admitted on 04/08/2023 with pneumonia.  Being treated for lung cancer, recent sick contacts WBC 14 Cr 1.6 crcl approx 51ml/min.  Pharmacy has been consulted for vancomycin and zosyn dosing. Received vancomycin 2gm x1  cefepime 2gmx1   and flagyl 500mg  x1 in ED 3/9  Plan: Zosyn 3.375gm IV q8h EI Vancomycin 2gm IV q48h start 3/11  Height: 5\' 4"  (162.6 cm) Weight: 96.2 kg (212 lb) IBW/kg (Calculated) : 54.7  Temp (24hrs), Avg:97.8 F (36.6 C), Min:97.5 F (36.4 C), Max:98.2 F (36.8 C)  Recent Labs  Lab 04/08/23 0305 04/08/23 0313 04/08/23 0530  WBC 14.2*  --   --   CREATININE 1.64*  --   --   LATICACIDVEN  --  2.4* 1.8    Estimated Creatinine Clearance: 31.3 mL/min (A) (by C-G formula based on SCr of 1.64 mg/dL (H)).    No Known Allergies  Antimicrobials this admission:   Dose adjustments this admission:   Microbiology results:   Leota Sauers Pharm.D. CPP, BCPS Clinical Pharmacist [phone] 04/08/2023 4:24 PM

## 2023-04-08 NOTE — Plan of Care (Signed)
  Problem: Education: Goal: Ability to describe self-care measures that may prevent or decrease complications (Diabetes Survival Skills Education) will improve Outcome: Progressing   Problem: Elimination: Goal: Will not experience complications related to urinary retention Outcome: Progressing   Problem: Pain Managment: Goal: General experience of comfort will improve and/or be controlled Outcome: Progressing   Problem: Skin Integrity: Goal: Risk for impaired skin integrity will decrease Outcome: Progressing

## 2023-04-08 NOTE — H&P (Signed)
 NAME:  Amanda Crawford, MRN:  [id number], DOB:  May 03, 1945, LOS: 0 ADMISSION DATE:  04/08/2023, CONSULTATION DATE:  04/08/23 REFERRING MD:  EDP, CHIEF COMPLAINT:  encephalopathy   History of Present Illness:  78 year old woman w/ hx of small cell lung cancer recently started on chemo, prior renal cell cancer w isolated met s/p left lung wedge resection for met and nephrectomy, CKD, IDDM, chronic GI issues presenting with increasing confusion and GI upset.  GI upset is primarily manifested by diarrhea and inability to hold in long enough to get to toilet. This has been an issue for her in the past.  Carries diagnosis of recurrent diverticulitis.  Workup in ER notable for very low blood glucose, shock state, and abnormal CT C/A/P: query pancreatitis, query colitis, query RLL PNA.  +sick contacts multiple family members with URI symptoms.  Persistently hypoglycemic and hypotensive so pressors, D10 started and PCCM consulted for admission.  Family (daughter primarily) notes patient having increasing confusion on which meds to take since starting chemo.  Sugars have also been trending lower during checks due to decreased appetite.  Baseline Bps are around 90-100 systolic  Pertinent  Medical History  See HPI  Significant Hospital Events: Including procedures, antibiotic start and stop dates in addition to other pertinent events   3/8 ER arrival 3/9 PCCM admit  Interim History / Subjective:  Overall feeling better just tired.  Objective   Blood pressure (!) 80/45, pulse 81, temperature 98.2 F (36.8 C), temperature source Oral, resp. rate 18, height 5\' 4"  (1.626 m), weight 96.2 kg, SpO2 100%.       No intake or output data in the 24 hours ending 04/08/23 1642 Filed Weights   04/08/23 0115  Weight: 96.2 kg    Examination: General: no distress HENT: MM dry, trachea midline Lungs: clear, no wheezing or rhonci, nonlabored breathing pattern Cardiovascular: RRR, ext warm Abdomen: soft, +BS,  mild TTP epigastrum radiating to LLQ Extremities: no edema, port in place Neuro: moves to command Skin: no rashes  Labs reviewed and actually look pretty good  Resolved Hospital Problem list   N/A  Assessment & Plan:  Hypovolemic and septic shock source either RLL PNA or colitis- on recent chemo so immunosuppressed. CKD Persistent hypoglycemia with associated encephalopathy improved on D10 gtt; suspicion here is that this is related to decreased PO and no change in home lantus dosing SCLC with mets to hip GERD RCC in remission ?cirrhosis- she is not aware of this diagnosis Thrombocytopenia, new, unclear cause  - Vanc/Zosyn/Doxy - Strep/legionella urine Ag, Pct, RVP - IV hydration as ordered - Levo for SBP 90 given baseline mild vasoplegia - SSI maybe tomorrow, adjust D10 gtt for CBG 100-180 - She/daughter/husband interested in more detailed med rec so she can know what pills to take and when, some concern that she is getting confused at home - Trend plts - See med rec, most things are held  [employer] (right click and "Reselect all SmartList Selections" daily)   Diet/type: Regular consistency (see orders) DVT prophylaxis SCD given thrombocytopenia Pressure ulcer(s): coccyx GI prophylaxis: PPI I don't know why Lines: N/A Foley:  N/A Code Status:  full code but would consider GOC rediscussion if things go wrong way Last date of multidisciplinary goals of care discussion [family updated]  Labs   CBC: Recent Labs  Lab 04/08/23 0305  WBC 14.2*  NEUTROABS 9.6*  HGB 10.4*  HCT 30.5*  MCV 97.8  PLT 41*    Basic  Metabolic Panel: Recent Labs  Lab 04/08/23 0305  NA 131*  K 3.2*  CL 100  CO2 18*  GLUCOSE 152*  BUN 56*  CREATININE 1.64*  CALCIUM 9.3   GFR: Estimated Creatinine Clearance: 31.3 mL/min (A) (by C-G formula based on SCr of 1.64 mg/dL (H)). Recent Labs  Lab 04/08/23 0305 04/08/23 0313 04/08/23 0530  WBC 14.2*  --   --   LATICACIDVEN  --  2.4*  1.8    Liver Function Tests: Recent Labs  Lab 04/08/23 0305  AST 47*  ALT 47*  ALKPHOS 85  BILITOT 0.6  PROT 5.3*  ALBUMIN 2.4*   Recent Labs  Lab 04/08/23 0940  LIPASE 62*   No results for input(s): "AMMONIA" in the last 168 hours.  ABG    Component Value Date/Time   PHART 7.349 (L) 07/27/2016 0802   PCO2ART 45.3 07/27/2016 0802   PO2ART 81.0 (L) 07/27/2016 0802   HCO3 26.7 07/27/2016 0811   TCO2 28 07/27/2016 0811   ACIDBASEDEF 1.0 07/27/2016 0802   O2SAT 64.0 07/27/2016 0811     Coagulation Profile: No results for input(s): "INR", "PROTIME" in the last 168 hours.  Cardiac Enzymes: No results for input(s): "CKTOTAL", "CKMB", "CKMBINDEX", "TROPONINI" in the last 168 hours.  HbA1C: Hgb A1c MFr Bld  Date/Time Value Ref Range Status  02/08/2023 11:10 AM 7.6 (H) 4.6 - 6.5 % Final    Comment:    Glycemic Control Guidelines for People with Diabetes:Non Diabetic:  <6%Goal of Therapy: <7%Additional Action Suggested:  >8%   10/12/2022 10:36 AM 6.8 (H) 4.6 - 6.5 % Final    Comment:    Glycemic Control Guidelines for People with Diabetes:Non Diabetic:  <6%Goal of Therapy: <7%Additional Action Suggested:  >8%     CBG: Recent Labs  Lab 04/08/23 1000 04/08/23 1059 04/08/23 1217 04/08/23 1324 04/08/23 1614  GLUCAP 98 117* 127* 126* 157*    Review of Systems:   Per HPI  Past Medical History:  She,  has a past medical history of Allergic state (03/12/2015), Arthritis, Cirrhosis (HCC), Colon polyp (11/19/2014), Dermatitis (03/12/2015), Diarrhea (06/29/2016), Diverticulitis, DM (diabetes mellitus), type 2 (HCC) (06/29/2016), GERD (gastroesophageal reflux disease), Gout (03/12/2015), H/O measles, H/O mumps, Headache(784.0), History of chicken pox (11/23/2014), Hyperglycemia (06/29/2016), Insomnia (02/13/2023), Lung cancer (HCC), Migraine (11/23/2014), Obesity (11/23/2014), Pneumonia, Preventative health care (09/12/2015), Primary hypertension (12/15/2020), Renal cell  cancer (HCC), Renal insufficiency, Shortness of breath, Skin lesion of breast (03/12/2015), Sleep apnea (01/24/2023), Small cell carcinoma of middle lobe of right lung (HCC) (03/14/2023), and Small cell lung cancer, right middle lobe (HCC) (03/14/2023).   Surgical History:   Past Surgical History:  Procedure Laterality Date   ABDOMINAL HYSTERECTOMY     ANAL RECTAL MANOMETRY N/A 06/29/2021   Procedure: ANO RECTAL MANOMETRY;  Surgeon: Shellia Cleverly, DO;  Location: [facility] ENDOSCOPY;  Service: Gastroenterology;  Laterality: N/A;   APPENDECTOMY     BRONCHIAL BIOPSY  02/20/2023   Procedure: BRONCHIAL BIOPSIES;  Surgeon: Leslye Peer, MD;  Location: [facility] ENDOSCOPY;  Service: Pulmonary;;   BRONCHIAL BRUSHINGS  02/20/2023   Procedure: BRONCHIAL BRUSHINGS;  Surgeon: Leslye Peer, MD;  Location: [facility] ENDOSCOPY;  Service: Pulmonary;;   BRONCHIAL NEEDLE ASPIRATION BIOPSY  02/20/2023   Procedure: BRONCHIAL NEEDLE ASPIRATION BIOPSIES;  Surgeon: Leslye Peer, MD;  Location: [facility] ENDOSCOPY;  Service: Pulmonary;;   CARDIAC CATHETERIZATION     yrs ago neg   CHOLECYSTECTOMY     COLONOSCOPY  2018   ESOPHAGOGASTRODUODENOSCOPY  years ago   FINE NEEDLE ASPIRATION  02/20/2023   Procedure: FINE NEEDLE ASPIRATION (FNA) LINEAR;  Surgeon: Leslye Peer, MD;  Location: [facility] ENDOSCOPY;  Service: Pulmonary;;   IR IMAGING GUIDED PORT INSERTION  03/21/2023   IR RADIOLOGIST EVAL & MGMT  07/17/2018   IR RADIOLOGIST EVAL & MGMT  09/12/2018   IR RADIOLOGIST EVAL & MGMT  06/18/2019   IR RADIOLOGIST EVAL & MGMT  01/13/2020   IR RADIOLOGIST EVAL & MGMT  07/08/2020   IR RADIOLOGIST EVAL & MGMT  10/12/2020   KIDNEY SURGERY  2001   Removed of left kidney    LUNG CANCER SURGERY Left 2008   RADIOLOGY WITH ANESTHESIA N/A 08/21/2018   Procedure: CT WITH ANESTHESIA RENAL CRYOABLATION;  Surgeon: Oley Balm, MD;  Location: [facility] ORS;  Service: Radiology;  Laterality: N/A;   RENAL MASS EXCISION Left 2001   RIGHT/LEFT HEART  CATH AND CORONARY ANGIOGRAPHY N/A 07/27/2016   Procedure: Right/Left Heart Cath and Coronary Angiography;  Surgeon: Marykay Lex, MD;  Location: [facility] INVASIVE CV LAB;  Service: Cardiovascular;  Laterality: N/A;   THORACOTOMY Left 05/09/2012   Procedure: THORACOTOMY MAJOR;  Surgeon: Alleen Borne, MD;  Location: [facility] OR;  Service: Thoracic;  Laterality: Left;   VIDEO BRONCHOSCOPY N/A 05/09/2012   Procedure: VIDEO BRONCHOSCOPY;  Surgeon: Alleen Borne, MD;  Location: [facility] OR;  Service: Thoracic;  Laterality: N/A;   VIDEO BRONCHOSCOPY WITH ENDOBRONCHIAL ULTRASOUND  02/20/2023   Procedure: VIDEO BRONCHOSCOPY WITH ENDOBRONCHIAL ULTRASOUND;  Surgeon: Leslye Peer, MD;  Location: [facility] ENDOSCOPY;  Service: Pulmonary;;   WEDGE RESECTION Left 05/09/2012   Procedure: LEFT UPPER LOBE WEDGE RESECTION;  Surgeon: Alleen Borne, MD;  Location: [facility] OR;  Service: Thoracic;  Laterality: Left;     Social History:   reports that she quit smoking about 16 years ago. Her smoking use included cigarettes. She started smoking about 51 years ago. She has a 17.5 pack-year smoking history. She has never used smokeless tobacco. She reports current alcohol use. She reports that she does not use drugs.   Family History:  Her family history includes Alcohol abuse in her maternal aunt; Asthma in her brother and daughter; Cancer in her paternal grandmother; Colon cancer in her paternal aunt; Dementia in her mother; Diabetes in her father; Hearing loss in her paternal grandfather; Heart disease in her father; Heart failure in her father; Hyperlipidemia in her father; Hypertension in her father; Stroke in her paternal grandfather.   Allergies No Known Allergies   Home Medications  Prior to Admission medications   Medication Sig Start Date End Date Taking? Authorizing Provider  allopurinol (ZYLOPRIM) 300 MG tablet TAKE 1 TABLET BY MOUTH EVERY DAY 03/15/23  Yes [name], MD  amoxicillin-clavulanate (AUGMENTIN) 875-125 MG  tablet Take 1 tablet by mouth 2 (two) times daily. 04/04/23  Yes Josph Macho, MD  aspirin EC 81 MG tablet Take 81 mg by mouth daily.   Yes [provider]  atorvastatin (LIPITOR) 20 MG tablet Take 1 tablet (20 mg total) by mouth daily. 06/22/22  Yes [name], MD  carvedilol (COREG) 3.125 MG tablet Take 0.5 tablets (1.5625 mg total) by mouth 2 (two) times daily with a meal. 02/20/23  Yes Byrum, Les Pou, MD  dexamethasone (DECADRON) 4 MG tablet Take 2 tablets daily for 2 days, on days 4 and 5.Take with food. Every 21 days. 03/14/23  Yes Josph Macho, MD  diphenhydramine-acetaminophen (TYLENOL PM) 25-500 MG TABS Take  1 tablet by mouth at bedtime as needed (sleep).   Yes [provider]  ferrous sulfate 325 (65 FE) MG tablet Take 325 mg by mouth at bedtime.   Yes [provider]  furosemide (LASIX) 40 MG tablet TAKE 1 TABLET BY MOUTH EVERY DAY 03/14/23  Yes Hilty, Lisette Abu, MD  glimepiride (AMARYL) 2 MG tablet TAKE 1 TABLET BY MOUTH EVERY DAY WITH BREAKFAST 02/08/23  Yes [name], MD  hyoscyamine (LEVSIN SL) 0.125 MG SL tablet Place 1 tablet (0.125 mg total) under the tongue every 6 (six) hours as needed. 10/14/20  Yes [name], MD  irbesartan (AVAPRO) 75 MG tablet Take 1 tablet (75 mg total) by mouth daily. 04/25/22  Yes [name], MD  meclizine (ANTIVERT) 12.5 MG tablet Take 1 tablet (12.5 mg total) by mouth 3 (three) times daily as needed for dizziness. As needed for   Dizziness or nausea 11/23/14  Yes [name], MD  nystatin cream (MYCOSTATIN) Apply 1 application topically 2 (two) times daily. Patient taking differently: Apply 1 application  topically 2 (two) times daily as needed (rash). 10/18/20  Yes [name], MD  ondansetron (ZOFRAN) 8 MG tablet Take 1 tablet (8 mg total) by mouth every 8 (eight) hours as needed for nausea or vomiting. Start on third day after chemotherapy. 03/14/23  Yes Josph Macho, MD  prochlorperazine  (COMPAZINE) 10 MG tablet Take 1 tablet (10 mg total) by mouth every 6 (six) hours as needed for nausea or vomiting (Nausea or vomiting). 03/14/23  Yes Ennever, Rose Phi, MD  SEMGLEE, YFGN, 100 UNIT/ML Pen Inject 14 Units into the skin at bedtime. 12/27/22  Yes [provider]  Vitamin D, Ergocalciferol, (DRISDOL) 1.25 MG (50000 UNIT) CAPS capsule TAKE 1 CAPSULE (50,000 UNITS TOTAL) BY MOUTH EVERY 7 (SEVEN) DAYS. 04/05/23  Yes Worthy Rancher B, FNP  zolpidem (AMBIEN) 5 MG tablet Take 1 tablet (5 mg total) by mouth at bedtime as needed for sleep. Patient taking differently: Take 5 mg by mouth at bedtime. 02/13/23  Yes Olalere, Adewale A, MD  cephALEXin (KEFLEX) 500 MG capsule Take 500 mg by mouth 2 (two) times daily. Patient not taking: Reported on 04/08/2023 04/05/23 04/12/23  [provider]  glucose blood (ONETOUCH VERIO) test strip Check Blood Sugar 3 times daily. 06/08/22   [name], MD  Insulin Pen Needle (BD PEN NEEDLE NANO 2ND GEN) 32G X 4 MM MISC USE AS DIRECTED WITH LANTUS ONCE A DAY. 11/20/22   [name], MD  Lancets [facility] DELICA PLUS LANCET33G) MISC USE AS DIRECTED TO TEST DAILY 09/11/22   [name], MD  glyBURIDE-metformin [facility]) 1.25-250 MG tablet Take 1 tablet by mouth 2 (two) times daily with a meal. 03/25/19 03/25/19  [name], MD     Critical care time: 32 mins

## 2023-04-08 NOTE — ED Notes (Signed)
 MD notified of decreasing blood pressures.

## 2023-04-08 NOTE — Progress Notes (Signed)
 04/08/2023 Coronavirus variant noted on RVP Okay for droplet If am Pct neg, consider DC abx as she's had a couple rounds of different ones recently. Patient signed out tentatively to [facility] given rapid improvement in BP  Myrla Halsted MD PCCM

## 2023-04-08 NOTE — ED Notes (Signed)
 Oxygen saturation decreased to 84 percent. Pt palced on 4L [place].

## 2023-04-08 NOTE — ED Notes (Addendum)
 Pt pale, radial pulses present, moderate. Speech is clear. Pt is alert and oriented at this time. CBG 120. Pt reports she has not experienced a hypoglycemic episode before, but reports decreased appetite due to feeling ill this last week.   Pt reported starting chemo infusions last month

## 2023-04-09 ENCOUNTER — Encounter: Payer: Self-pay | Admitting: Hematology & Oncology

## 2023-04-09 DIAGNOSIS — R579 Shock, unspecified: Secondary | ICD-10-CM | POA: Diagnosis not present

## 2023-04-09 LAB — GLUCOSE, CAPILLARY
Glucose-Capillary: 163 mg/dL — ABNORMAL HIGH (ref 70–99)
Glucose-Capillary: 160 mg/dL — ABNORMAL HIGH (ref 70–99)
Glucose-Capillary: 155 mg/dL — ABNORMAL HIGH (ref 70–99)
Glucose-Capillary: 133 mg/dL — ABNORMAL HIGH (ref 70–99)
Glucose-Capillary: 109 mg/dL — ABNORMAL HIGH (ref 70–99)

## 2023-04-09 LAB — CBC
HCT: 29.7 % — ABNORMAL LOW (ref 36.0–46.0)
Hemoglobin: 9.6 g/dL — ABNORMAL LOW (ref 12.0–15.0)
MCH: 33.3 pg (ref 26.0–34.0)
MCHC: 32.3 g/dL (ref 30.0–36.0)
MCV: 103.1 fL — ABNORMAL HIGH (ref 80.0–100.0)
Platelets: 59 10*3/uL — ABNORMAL LOW (ref 150–400)
RBC: 2.88 MIL/uL — ABNORMAL LOW (ref 3.87–5.11)
RDW: 14 % (ref 11.5–15.5)
WBC: 26.8 10*3/uL — ABNORMAL HIGH (ref 4.0–10.5)
nRBC: 0.1 % (ref 0.0–0.2)

## 2023-04-09 LAB — BASIC METABOLIC PANEL
Anion gap: 7 (ref 5–15)
BUN: 37 mg/dL — ABNORMAL HIGH (ref 8–23)
CO2: 14 mmol/L — ABNORMAL LOW (ref 22–32)
Calcium: 9.3 mg/dL (ref 8.9–10.3)
Chloride: 111 mmol/L (ref 98–111)
Creatinine, Ser: 1.4 mg/dL — ABNORMAL HIGH (ref 0.44–1.00)
GFR, Estimated: 38 mL/min — ABNORMAL LOW (ref >60)
Glucose, Bld: 177 mg/dL — ABNORMAL HIGH (ref 70–99)
Potassium: 4.3 mmol/L (ref 3.5–5.1)
Sodium: 132 mmol/L — ABNORMAL LOW (ref 135–145)

## 2023-04-09 LAB — PHOSPHORUS: Phosphorus: 2.4 mg/dL — ABNORMAL LOW (ref 2.5–4.6)

## 2023-04-09 LAB — MAGNESIUM: Magnesium: 1.7 mg/dL (ref 1.7–2.4)

## 2023-04-09 LAB — PROCALCITONIN: Procalcitonin: 1.19 ng/mL

## 2023-04-09 MED ORDER — SODIUM CHLORIDE 0.9% FLUSH
10.0000 mL | Freq: Two times a day (BID) | INTRAVENOUS | Status: DC
  Administered 2023-04-09 – 2023-04-12 (×6): 10 mL

## 2023-04-09 MED ORDER — SODIUM CHLORIDE 0.9% FLUSH: 10.0000 mL | INTRAVENOUS | Status: DC | PRN

## 2023-04-09 MED ORDER — ZINC OXIDE 40 % EX OINT
TOPICAL_OINTMENT | Freq: Three times a day (TID) | CUTANEOUS | Status: DC
  Filled 2023-04-09: qty 57

## 2023-04-09 MED ORDER — MAGNESIUM SULFATE 2 GM/50ML IV SOLN
2.0000 g | Freq: Once | INTRAVENOUS | Status: AC
  Administered 2023-04-09: 2 g via INTRAVENOUS
  Filled 2023-04-09: qty 50

## 2023-04-09 MED ORDER — MEDIHONEY WOUND/BURN DRESSING EX PSTE
1.0000 | PASTE | Freq: Every day | CUTANEOUS | Status: DC
  Administered 2023-04-09 – 2023-04-12 (×4): 1 via TOPICAL
  Filled 2023-04-09: qty 44

## 2023-04-09 MED ORDER — INSULIN ASPART 100 UNIT/ML IJ SOLN
0.0000 [IU] | Freq: Three times a day (TID) | INTRAMUSCULAR | Status: DC
  Administered 2023-04-11: 4 [IU] via SUBCUTANEOUS

## 2023-04-09 NOTE — TOC Initial Note (Signed)
 Transition of Care [facility]) - Initial/Assessment Note    Patient Details  Name: Amanda Crawford MRN: [id number] Date of Birth: 08/21/1945  [facility]) CM/SW Contact:    Gala Lewandowsky, RN Phone Number: 04/09/2023, 12:05 PM  Clinical Narrative: Patient presented for encephalopathy. Patient was discussed in progression rounds. PTA patient was from home with spouse. Patient states she was independent and does not use any DME. Patient has PCP and transportation to appointments. Case Manager will continue to follow for transition of care needs as the patient progresses.                 Expected Discharge Plan: Home w Home Health Services Barriers to Discharge: Continued Medical Work up  Patient Goals and CMS Choice Patient states their goals for this hospitalization and ongoing recovery are:: patient wants to return home onces stable.   Choice offered to / list presented to : NA   Expected Discharge Plan and Services   Discharge Planning Services: CM Consult   Living arrangements for the past 2 months: Single Family Home    Prior Living Arrangements/Services Living arrangements for the past 2 months: Single Family Home Lives with:: Spouse Patient language and need for interpreter reviewed:: Yes Do you feel safe going back to the place where you live?: Yes      Need for Family Participation in Patient Care: Yes (Comment) Care giver support system in place?: Yes (comment)   Criminal Activity/Legal Involvement Pertinent to Current Situation/Hospitalization: No - Comment as needed  Activities of Daily Living   ADL Screening (condition at time of admission) Independently performs ADLs?: Yes (appropriate for developmental age) Is the patient deaf or have difficulty hearing?: No Does the patient have difficulty seeing, even when wearing glasses/contacts?: No Does the patient have difficulty concentrating, remembering, or making decisions?: No  Permission  Sought/Granted Permission sought to share information with : Case Manager, Family Supports     Emotional Assessment Appearance:: Appears stated age Attitude/Demeanor/Rapport: Engaged Affect (typically observed): Appropriate Orientation: : Oriented to Place, Oriented to Self, Oriented to  Time Alcohol / Substance Use: Not Applicable Psych Involvement: No (comment)  Admission diagnosis:  Diverticulitis [K57.92] Shock (HCC) [R57.9] Hypoglycemia [E16.2] Altered mental status, unspecified altered mental status type [R41.82] Sepsis, due to unspecified organism, unspecified whether acute organ dysfunction present [facility]) [A41.9] Patient Active Problem List   Diagnosis Date Noted   Shock (HCC) 04/08/2023   Small cell carcinoma of middle lobe of right lung (HCC) 03/14/2023   Pulmonary nodule 02/20/2023   Mediastinal adenopathy 02/20/2023   Right foot pain 10/12/2022   Hypercalcemia 04/12/2022   Incontinence of feces    Liver disease 05/12/2021   Right hip pain 03/03/2021   Elevated lipase 03/03/2021   Acute pancreatitis 12/15/2020   Anemia 12/15/2020   Primary hypertension 12/15/2020   Hyperkalemia 10/14/2020   Urinary incontinence 10/14/2020   Frequent urination 09/09/2020   Chronic midline low back pain without sciatica 09/09/2020   Diverticulitis 01/06/2019   Renal insufficiency 09/22/2018   Right renal mass 08/21/2018   Malignant tumor of renal pelvis, right (HCC) 08/21/2018   Coronary artery disease involving native coronary artery of native heart without angina pectoris 07/30/2017   Chronic systolic heart failure (HCC) 07/30/2017   Skin tags, multiple acquired 02/08/2017   Nonischemic cardiomyopathy (HCC) 07/24/2016   LBBB (left bundle branch block) 07/24/2016   Diarrhea 06/29/2016   DM (diabetes mellitus), type 2 (HCC) 06/29/2016   Bilateral thoracic back pain 09/12/2015  Preventative health care 09/12/2015   Allergy 03/12/2015   Dermatitis 03/12/2015   Skin lesion of  breast 03/12/2015   Gout 03/12/2015   Morbid obesity (HCC) 01/20/2015   Migraine 11/23/2014   DJD (degenerative joint disease) 11/23/2014   Benign paroxysmal positional vertigo 11/23/2014   H/O measles    Hx of colonic polyp 11/19/2014   Hx of cancer of lung 11/19/2014   GERD (gastroesophageal reflux disease) 11/19/2014   Hyperlipidemia 11/19/2014   Osteopenia 11/19/2014   Vitamin D deficiency 11/19/2014   COUGH 09/16/2007   Cancer of kidney (HCC) 08/14/2007   WEIGHT GAIN, ABNORMAL 08/14/2007   Dyspnea 08/14/2007   PCP:  [name], MD Pharmacy:   [facility] [phone] - [place] - [address] [place] [address] Phone: [phone] Fax: [phone]  [facility] - [place], [place] - [address] [place] [address] Phone: [phone] Fax: [phone]   Social Drivers of Health (SDOH) Social History: SDOH Screenings   Food Insecurity: No Food Insecurity (04/09/2023)  Housing: Low Risk  (04/09/2023)  Transportation Needs: No Transportation Needs (04/09/2023)  Utilities: Not At Risk (04/09/2023)  Alcohol Screen: Low Risk  (07/05/2020)  Depression (PHQ2-9): Low Risk  (12/04/2022)  Financial Resource Strain: Low Risk  (07/05/2020)  Physical Activity: Inactive (07/05/2020)  Social Connections: Socially Integrated (04/09/2023)  Stress: No Stress Concern Present (08/12/2022)   Received from [facility]  Tobacco Use: Medium Risk (04/08/2023)   Readmission Risk Interventions    04/09/2023   12:01 PM  Readmission Risk Prevention Plan  Transportation Screening Complete  HRI or Home Care Consult Complete  Social Work Consult for Recovery Care Planning/Counseling Complete  Palliative Care Screening Not Applicable  Medication Review Oceanographer) Referral to Pharmacy

## 2023-04-09 NOTE — Plan of Care (Signed)

## 2023-04-09 NOTE — Progress Notes (Signed)
 PROGRESS NOTE    Amanda Crawford  ZOX:[id number] DOB: 05-06-1945 DOA: 04/08/2023 PCP: [name], MD    Brief Narrative:  78 year old woman w/ hx of small cell lung cancer recently started on chemo, prior renal cell cancer w isolated met s/p left lung wedge resection for met and nephrectomy, CKD, IDDM, chronic GI issues presenting with increasing confusion and GI upset.  GI upset is primarily manifested by diarrhea and inability to hold in long enough to get to toilet. This has been an issue for her in the past.  Carries diagnosis of recurrent diverticulitis.  Workup in ER notable for very low blood glucose, shock state, and abnormal CT C/A/P: query pancreatitis, query colitis, query RLL PNA.  tx to [facility] from PCCM 3/10   Assessment and Plan:  Hypovolemic and septic shock source either RLL PNA or colitis- on recent chemo so immunosuppressed.  -d/c vanc, continue doxy/zosyn for now? -off levophed -CT scan of abd: Edema/inflammation around the pancreatic head and proximal transverse duodenum with subtle peripancreatic edema in the region of the pancreatic tail. Imaging features are compatible with acute pancreatitis given changes in the pancreatic tail region. Ill definition of the descending and proximal transverse duodenum is likely secondary although duodenitis could have this appearance. Correlation with serum lipase recommended. -lipase normal  Diverticulitis -IV zosyn -liquid diet-- advance as tolerated  Hypoglycemia -off glucose ggt -monitor with accuchecks  Hyponatremia -mild -trend  Coronavirus variant -droplet -supportive care  SCLC with mets to hip   cirrhosis- she is not aware of this diagnosis: Cirrhosis with paraesophageal and gastric varices seen on CT scan.  -outpatient follow up  Thrombocytopenia -trending up  CKD stage IIIb -baseline around 1.4  HTN -hold BP Meds for now  DVT prophylaxis: SCDs Start: 04/08/23 1036    Code Status: Full  Code   Disposition Plan:  Level of care: Progressive Status is: Inpatient     Consultants:  PCCM  Subjective: Overall feeling better-- still with LLQ pain  Objective: Vitals:   04/09/23 0600 04/09/23 0700 04/09/23 0737 04/09/23 0800  BP: (!) 137/51 (!) 111/95  (!) 115/59  Pulse: 91 95  100  Resp: (!) 21 (!) 29  (!) 21  Temp:   98.5 F (36.9 C)   TempSrc:   Oral   SpO2: 96% 96%  95%  Weight:      Height:        Intake/Output Summary (Last 24 hours) at 04/09/2023 0944 Last data filed at 04/09/2023 0800 Gross per 24 hour  Intake 2642.88 ml  Output 1550 ml  Net 1092.88 ml   Filed Weights   04/08/23 0115 04/09/23 0500  Weight: 96.2 kg 98.2 kg    Examination:   General: Appearance:    Obese female in no acute distress     Lungs:    respirations unlabored  Heart:    Tachycardic.   MS:   All extremities are intact.    Neurologic:   Awake, alert       Data Reviewed: I have personally reviewed following labs and imaging studies  CBC: Recent Labs  Lab 04/08/23 0305 04/09/23 0335  WBC 14.2* 26.8*  NEUTROABS 9.6*  --   HGB 10.4* 9.6*  HCT 30.5* 29.7*  MCV 97.8 103.1*  PLT 41* 59*   Basic Metabolic Panel: Recent Labs  Lab 04/08/23 0305 04/09/23 0335  NA 131* 132*  K 3.2* 4.3  CL 100 111  CO2 18* 14*  GLUCOSE 152* 177*  BUN 56* 37*  CREATININE 1.64* 1.40*  CALCIUM 9.3 9.3  MG  --  1.7  PHOS  --  2.4*   GFR: Estimated Creatinine Clearance: 37.1 mL/min (A) (by C-G formula based on SCr of 1.4 mg/dL (H)). Liver Function Tests: Recent Labs  Lab 04/08/23 0305  AST 47*  ALT 47*  ALKPHOS 85  BILITOT 0.6  PROT 5.3*  ALBUMIN 2.4*   Recent Labs  Lab 04/08/23 0940  LIPASE 62*   No results for input(s): "AMMONIA" in the last 168 hours. Coagulation Profile: No results for input(s): "INR", "PROTIME" in the last 168 hours. Cardiac Enzymes: No results for input(s): "CKTOTAL", "CKMB", "CKMBINDEX", "TROPONINI" in the last 168 hours. BNP (last 3  results) No results for input(s): "PROBNP" in the last 8760 hours. HbA1C: No results for input(s): "HGBA1C" in the last 72 hours. CBG: Recent Labs  Lab 04/08/23 1614 04/08/23 2050 04/09/23 0156 04/09/23 0533 04/09/23 0744  GLUCAP 157* 165* 163* 160* 155*   Lipid Profile: No results for input(s): "CHOL", "HDL", "LDLCALC", "TRIG", "CHOLHDL", "LDLDIRECT" in the last 72 hours. Thyroid Function Tests: No results for input(s): "TSH", "T4TOTAL", "FREET4", "T3FREE", "THYROIDAB" in the last 72 hours. Anemia Panel: No results for input(s): "VITAMINB12", "FOLATE", "FERRITIN", "TIBC", "IRON", "RETICCTPCT" in the last 72 hours. Sepsis Labs: Recent Labs  Lab 04/08/23 0313 04/08/23 0530 04/09/23 0335  PROCALCITON  --   --  1.19  LATICACIDVEN 2.4* 1.8  --     Recent Results (from the past 240 hours)  Resp panel by RT-PCR (RSV, Flu A&B, Covid) Anterior Nasal Swab     Status: None   Collection Time: 04/08/23  4:11 AM   Specimen: Anterior Nasal Swab  Result Value Ref Range Status   SARS Coronavirus 2 by RT PCR NEGATIVE NEGATIVE Final   Influenza A by PCR NEGATIVE NEGATIVE Final   Influenza B by PCR NEGATIVE NEGATIVE Final    Comment: (NOTE) The Xpert Xpress SARS-CoV-2/FLU/RSV plus assay is intended as an aid in the diagnosis of influenza from Nasopharyngeal swab specimens and should not be used as a sole basis for treatment. Nasal washings and aspirates are unacceptable for Xpert Xpress SARS-CoV-2/FLU/RSV testing.  Fact Sheet for Patients: BloggerCourse.com  Fact Sheet for Healthcare Providers: SeriousBroker.it  This test is not yet approved or cleared by the [place] FDA and has been authorized for detection and/or diagnosis of SARS-CoV-2 by FDA under an Emergency Use Authorization (EUA). This EUA will remain in effect (meaning this test can be used) for the duration of the COVID-19 declaration under Section 564(b)(1) of the  Act, 21 U.S.C. section 360bbb-3(b)(1), unless the authorization is terminated or revoked.     Resp Syncytial Virus by PCR NEGATIVE NEGATIVE Final    Comment: (NOTE) Fact Sheet for Patients: BloggerCourse.com  Fact Sheet for Healthcare Providers: SeriousBroker.it  This test is not yet approved or cleared by the [place] FDA and has been authorized for detection and/or diagnosis of SARS-CoV-2 by FDA under an Emergency Use Authorization (EUA). This EUA will remain in effect (meaning this test can be used) for the duration of the COVID-19 declaration under Section 564(b)(1) of the Act, 21 U.S.C. section 360bbb-3(b)(1), unless the authorization is terminated or revoked.  Performed at [facility], [address], [place], [place] [address]   Culture, blood (routine x 2)     Status: None (Preliminary result)   Collection Time: 04/08/23  6:22 AM   Specimen: BLOOD RIGHT HAND  Result Value Ref Range  Status   Specimen Description BLOOD RIGHT HAND  Final   Special Requests   Final    BOTTLES DRAWN AEROBIC AND ANAEROBIC Blood Culture results may not be optimal due to an inadequate volume of blood received in culture bottles   Culture   Final    NO GROWTH 1 DAY Performed at [facility], [address], [place], [place] [address]    Report Status PENDING  Incomplete  Respiratory (~20 pathogens) panel by PCR     Status: Abnormal   Collection Time: 04/08/23 10:36 AM   Specimen: Nasopharyngeal Swab; Respiratory  Result Value Ref Range Status   Adenovirus NOT DETECTED NOT DETECTED Final   Coronavirus 229E NOT DETECTED NOT DETECTED Final    Comment: (NOTE) The Coronavirus on the Respiratory Panel, DOES NOT test for the novel  Coronavirus (2019 nCoV)    Coronavirus HKU1 NOT DETECTED NOT DETECTED Final   Coronavirus NL63 NOT DETECTED NOT DETECTED Final   Coronavirus OC43 DETECTED (A) NOT DETECTED Final   Metapneumovirus  NOT DETECTED NOT DETECTED Final   Rhinovirus / Enterovirus NOT DETECTED NOT DETECTED Final   Influenza A NOT DETECTED NOT DETECTED Final   Influenza B NOT DETECTED NOT DETECTED Final   Parainfluenza Virus 1 NOT DETECTED NOT DETECTED Final   Parainfluenza Virus 2 NOT DETECTED NOT DETECTED Final   Parainfluenza Virus 3 NOT DETECTED NOT DETECTED Final   Parainfluenza Virus 4 NOT DETECTED NOT DETECTED Final   Respiratory Syncytial Virus NOT DETECTED NOT DETECTED Final   Bordetella pertussis NOT DETECTED NOT DETECTED Final   Bordetella Parapertussis NOT DETECTED NOT DETECTED Final   Chlamydophila pneumoniae NOT DETECTED NOT DETECTED Final   Mycoplasma pneumoniae NOT DETECTED NOT DETECTED Final    Comment: Performed at [facility], [address], [place], [place] [address]  MRSA Next Gen by PCR, Nasal     Status: None   Collection Time: 04/08/23  1:09 PM   Specimen: Nasal Mucosa; Nasal Swab  Result Value Ref Range Status   MRSA by PCR Next Gen NOT DETECTED NOT DETECTED Final    Comment: (NOTE) The GeneXpert MRSA Assay (FDA approved for NASAL specimens only), is one component of a comprehensive MRSA colonization surveillance program. It is not intended to diagnose MRSA infection nor to guide or monitor treatment for MRSA infections. Test performance is not FDA approved in patients less than 17 years old. Performed at [facility], [address], [place], [place] [address]   Culture, blood (routine x 2)     Status: None (Preliminary result)   Collection Time: 04/08/23  1:26 PM   Specimen: BLOOD RIGHT HAND  Result Value Ref Range Status   Specimen Description BLOOD RIGHT HAND  Final   Special Requests   Final    BOTTLES DRAWN AEROBIC AND ANAEROBIC Blood Culture results may not be optimal due to an inadequate volume of blood received in culture bottles   Culture   Final    NO GROWTH < 24 HOURS Performed at [facility], [address], [place], [place] [address]     Report Status PENDING  Incomplete         Radiology Studies: CT CHEST ABDOMEN PELVIS W CONTRAST Result Date: 04/08/2023 CLINICAL DATA:  Cold and flu like symptoms. Weakness, chills, sore throat, diarrhea, nausea, fatigue. Personal history of lung cancer. * Tracking Code: BO * EXAM: CT CHEST, ABDOMEN, AND PELVIS WITH CONTRAST TECHNIQUE: Multidetector CT imaging of the  chest, abdomen and pelvis was performed following the standard protocol during bolus administration of intravenous contrast. RADIATION DOSE REDUCTION: This exam was performed according to the departmental dose-optimization program which includes automated exposure control, adjustment of the mA and/or kV according to patient size and/or use of iterative reconstruction technique. CONTRAST:  60mL OMNIPAQUE IOHEXOL 350 MG/ML SOLN COMPARISON:  PET-CT 03/16/2023 FINDINGS: CT CHEST FINDINGS Cardiovascular: The heart size is normal. No substantial pericardial effusion. Coronary artery calcification is evident. Mild atherosclerotic calcification is noted in the wall of the thoracic aorta. Right Port-A-Cath tip is positioned at the SVC/RA junction. Mediastinum/Nodes: Right paratracheal node measured previously at 11 mm short axis is 7 mm short axis today on 14/3. Subcarinal lymph node measured previously at 2 cm short axis is 1.4 cm short axis today. No evidence for gross hilar lymphadenopathy although assessment is limited by the lack of intravenous contrast on the current study. There is no axillary lymphadenopathy. The esophagus has normal imaging features. Paraesophageal varices evident. Lungs/Pleura: Right lower lobe mass measured previously at 3.4 cm is 2.7 cm today on image 80/4. Atelectasis or scarring is noted in the right lower lung. No new suspicious pulmonary nodule or mass. Musculoskeletal: No worrisome lytic or sclerotic osseous abnormality. Nonacute right rib fracture evident. CT ABDOMEN PELVIS FINDINGS Hepatobiliary: Nodular liver contour  is compatible with cirrhosis. Cholecystectomy. No intrahepatic or extrahepatic biliary dilation. Pancreas: Pancreatic parenchyma is ill-defined with edema/inflammation seen around the pancreatic head and proximal transverse duodenum (image 68-78 of series 3) in subtle peripancreatic edema in the region of the pancreatic tail (axial 63/3). No main duct dilatation. Spleen: No splenomegaly. No suspicious focal mass lesion. Adrenals/Urinary Tract: No adrenal nodule or mass. Focal scarring noted lower pole right kidney. Left kidney surgically absent. Right ureter unremarkable. The urinary bladder appears normal for the degree of distention. Stomach/Bowel: Gastric varices noted in the region of the cardia. Wall of the descending and proximal transverse duodenum is ill-defined with adjacent edema. Small bowel is otherwise unremarkable. The terminal ileum is normal. The appendix is not well visualized, but there is no edema or inflammation in the region of the cecal tip to suggest appendicitis. Diverticular changes seen in the left colon and there is subtle pericolonic edema/inflammation (axial 99/3) with some thickening of the transverse mesocolon. Vascular/Lymphatic: There is advanced atherosclerotic calcification of the abdominal aorta without aneurysm. There is no gastrohepatic or hepatoduodenal ligament lymphadenopathy. No retroperitoneal or mesenteric lymphadenopathy. No pelvic sidewall lymphadenopathy. Reproductive: Hysterectomy.  There is no adnexal mass. Other: No intraperitoneal free fluid. Musculoskeletal: No worrisome lytic or sclerotic osseous abnormality. Similar appearance of previously described 2.8 cm posterior left iliac bone lesion (image 92/3). IMPRESSION: 1. Edema/inflammation around the pancreatic head and proximal transverse duodenum with subtle peripancreatic edema in the region of the pancreatic tail. Imaging features are compatible with acute pancreatitis given changes in the pancreatic tail  region. Ill definition of the descending and proximal transverse duodenum is likely secondary although duodenitis could have this appearance. Correlation with serum lipase recommended. 2. Left colonic diverticulosis with subtle pericolonic edema/inflammation and some thickening of the transverse mesocolon. Imaging features compatible with acute diverticulitis. 3. Right lower lobe mass measured previously at 3.4 cm is 2.7 cm today. 4. Mediastinal lymphadenopathy has improved in the interval. 5. Cirrhosis with paraesophageal and gastric varices. 6. Similar appearance of previously described 2.8 cm posterior left iliac bone lesion noted to be hypermetabolic on PET imaging is suspicious for metastatic involvement. Electronically Signed   By: Kennith Center  M.D.   On: 04/08/2023 09:17   CT Head Wo Contrast Result Date: 04/08/2023 CLINICAL DATA:  Neuro deficit, acute, stroke suspected EXAM: CT HEAD WITHOUT CONTRAST TECHNIQUE: Contiguous axial images were obtained from the base of the skull through the vertex without intravenous contrast. RADIATION DOSE REDUCTION: This exam was performed according to the departmental dose-optimization program which includes automated exposure control, adjustment of the mA and/or kV according to patient size and/or use of iterative reconstruction technique. COMPARISON:  MRI head March 10, 2023. FINDINGS: Brain: Small probable meningioma better evaluated on recent MRI head. No evidence of acute large vascular territory infarct, acute hemorrhage, mass lesion or midline shift. Patchy white matter hypodensities are compatible with chronic microvascular ischemic change. Vascular: No hyperdense vessel. Skull: No acute fracture. Sinuses/Orbits: Mild paranasal sinus mucosal thickening. No acute orbital findings. Other: No mastoid effusions. IMPRESSION: No evidence of acute intracranial abnormality. Electronically Signed   By: Feliberto Harts M.D.   On: 04/08/2023 04:10   DG Chest Portable 1  View Result Date: 04/08/2023 CLINICAL DATA:  Hypoglycemia EXAM: PORTABLE CHEST 1 VIEW COMPARISON:  03/02/2023 FINDINGS: Lungs are symmetrically expanded. Linear atelectasis at the right lung base. No pneumothorax or pleural effusion. Interval right internal jugular chest port placement with its tip within the superior vena cava. A a cardiac size within normal limits. Pulmonary vascularity is normal. Osseous structures are age-appropriate. No acute bone abnormality. IMPRESSION: 1. Right basilar atelectasis. Electronically Signed   By: Helyn Numbers M.D.   On: 04/08/2023 01:51        Scheduled Meds:  Chlorhexidine Gluconate Cloth  6 each Topical Daily   insulin aspart  0-15 Units Subcutaneous TID WC   leptospermum manuka honey  1 Application Topical Daily   liver oil-zinc oxide   Topical TID   pantoprazole (PROTONIX) IV  40 mg Intravenous QHS   sodium chloride flush  10-40 mL Intracatheter Q12H   Continuous Infusions:  sodium chloride Stopped (04/08/23 1038)   dextrose Stopped (04/09/23 0821)   doxycycline (VIBRAMYCIN) IV Stopped (04/09/23 0743)   lactated ringers 40 mL/hr at 04/09/23 0800   norepinephrine (LEVOPHED) Adult infusion Stopped (04/08/23 2057)   piperacillin-tazobactam (ZOSYN)  IV 12.5 mL/hr at 04/09/23 0800   [START ON 04/10/2023] vancomycin       LOS: 1 day    Time spent: 45 minutes spent on chart review, discussion with nursing staff, consultants, updating family and interview/physical exam; more than 50% of that time was spent in counseling and/or coordination of care.    Joseph Art, DO [facility] Available via Epic secure chat 7am-7pm After these hours, please refer to coverage provider listed on [email or web address] 04/09/2023, 9:44 AM

## 2023-04-09 NOTE — Consult Note (Signed)
 WOC Nurse Consult Note: Reason for Consult: pressure injury coccyx  Wound type: Unstageable PI coccyx  Pressure Injury POA: Yes Measurement: see nursing flowsheet  Wound bed: 90% yellow necrotic tissue 10% pink moist  Drainage (amount, consistency, odor)  see nursing flowsheet  Periwound: moisture associated skin damage with partial thickness skin loss and erythema  Dressing procedure/placement/frequency: Cleanse coccyx ulcer and intact skin of buttocks with Vashe wound cleanser [place] [phone]), do not rinse and allow to air dry. Apply Medihoney to wound bed of coccyx ulcer daily, cover with dry gauze.  Apply a thin layer of zinc to skin surrounding ulcer and buttocks.  May cover with silicone foam or ABD pad whichever is preferred.   POC discussed with bedside nurse. WOC team will not follow. Re-consult if further needs arise.   Thank you,    Priscella Mann MSN, RN-BC, [employer] [phone]

## 2023-04-10 DIAGNOSIS — E162 Hypoglycemia, unspecified: Secondary | ICD-10-CM | POA: Diagnosis not present

## 2023-04-10 DIAGNOSIS — K5792 Diverticulitis of intestine, part unspecified, without perforation or abscess without bleeding: Secondary | ICD-10-CM | POA: Diagnosis not present

## 2023-04-10 DIAGNOSIS — A419 Sepsis, unspecified organism: Secondary | ICD-10-CM | POA: Diagnosis not present

## 2023-04-10 DIAGNOSIS — R579 Shock, unspecified: Secondary | ICD-10-CM | POA: Diagnosis not present

## 2023-04-10 LAB — CBC
HCT: 28.4 % — ABNORMAL LOW (ref 36.0–46.0)
Hemoglobin: 9.4 g/dL — ABNORMAL LOW (ref 12.0–15.0)
MCH: 33.1 pg (ref 26.0–34.0)
MCHC: 33.1 g/dL (ref 30.0–36.0)
MCV: 100 fL (ref 80.0–100.0)
Platelets: 79 10*3/uL — ABNORMAL LOW (ref 150–400)
RBC: 2.84 MIL/uL — ABNORMAL LOW (ref 3.87–5.11)
RDW: 14.2 % (ref 11.5–15.5)
WBC: 35.5 10*3/uL — ABNORMAL HIGH (ref 4.0–10.5)
nRBC: 0.1 % (ref 0.0–0.2)

## 2023-04-10 LAB — GLUCOSE, CAPILLARY
Glucose-Capillary: 80 mg/dL (ref 70–99)
Glucose-Capillary: 110 mg/dL — ABNORMAL HIGH (ref 70–99)
Glucose-Capillary: 130 mg/dL — ABNORMAL HIGH (ref 70–99)

## 2023-04-10 LAB — BASIC METABOLIC PANEL
Anion gap: 8 (ref 5–15)
BUN: 28 mg/dL — ABNORMAL HIGH (ref 8–23)
CO2: 18 mmol/L — ABNORMAL LOW (ref 22–32)
Calcium: 9.8 mg/dL (ref 8.9–10.3)
Chloride: 112 mmol/L — ABNORMAL HIGH (ref 98–111)
Creatinine, Ser: 1.45 mg/dL — ABNORMAL HIGH (ref 0.44–1.00)
GFR, Estimated: 37 mL/min — ABNORMAL LOW (ref >60)
Glucose, Bld: 101 mg/dL — ABNORMAL HIGH (ref 70–99)
Potassium: 4.1 mmol/L (ref 3.5–5.1)
Sodium: 138 mmol/L (ref 135–145)

## 2023-04-10 LAB — PROCALCITONIN: Procalcitonin: 1.01 ng/mL

## 2023-04-10 LAB — PHOSPHORUS: Phosphorus: 2.2 mg/dL — ABNORMAL LOW (ref 2.5–4.6)

## 2023-04-10 LAB — MAGNESIUM: Magnesium: 2.1 mg/dL (ref 1.7–2.4)

## 2023-04-10 MED ORDER — POLYETHYLENE GLYCOL 3350 17 G PO PACK
34.0000 g | PACK | Freq: Once | ORAL | Status: AC
  Administered 2023-04-10: 34 g via ORAL
  Filled 2023-04-10: qty 2

## 2023-04-10 MED ORDER — K PHOS MONO-SOD PHOS DI & MONO 155-852-130 MG PO TABS
500.0000 mg | ORAL_TABLET | Freq: Three times a day (TID) | ORAL | Status: AC
  Administered 2023-04-10 (×3): 500 mg via ORAL
  Filled 2023-04-10 (×3): qty 2

## 2023-04-10 MED ORDER — POLYETHYLENE GLYCOL 3350 17 G PO PACK
17.0000 g | PACK | Freq: Every day | ORAL | Status: DC
  Filled 2023-04-10: qty 1

## 2023-04-10 NOTE — Progress Notes (Cosign Needed)
 COLANDRA OHANIAN   DOB:03-13-1945   ZO#:[id number]      ASSESSMENT & PLAN:  Small cell lung cancer, extensive stage with nodal mets and bone mets -Bronchoscopy done 02/20/2023.  Pathology showed small cell lung cancer. - PET scan done 03/16/2023 showed hypermetabolic mediastinal and right hilar lymph nodes consistent with nodal mets, right lower lobe mass consistent with metastatic disease, left iliac bone lesion concerning for bone mets. -Started on carbo/VP-16/durvalumab, every 21 days, cycle 1 given on 03/26/2023 - CT scans done 04/08/2023 this admission shows acute pancreatitis.  Also showed right lower lobe mass previously measuring 3.4 cm, decreased to 2.7 cm today.  Mediastinal lymphadenopathy improved in the interval. - Follows with Dr. Oneta Rack oncology  Renal cell carcinoma, clear-cell - Diagnosed in 2001.  Status post left nephrectomy at that time. - Subsequently right renal neoplasm diagnosed in July 2020.  Status post cryoablation. - On active surveillance  Altered mental status Generalized weakness/deconditioning -Patient seen today, answers questions appropriately. -Patient admitted on 04/08/2023 with chief complaint of increasing confusion per patient's daughter. - CT of head done this admission 04/08/2023 showed no evidence of acute intracranial abnormality. - Continue supportive care  Diarrhea - Also admitted with complaint of increasing diarrhea and inability to get to bathroom on time. Admits to multiple sick family members with similar -Patient seen today diarrhea is ongoing. - Continue to monitor  Acute pancreatitis Cirrhosis Abdominal pain - Per CT scan done 04/08/2023 -On IV antibiotics, continue as ordered - Continue supportive care  CKD - Creatinine elevated in mid 1 range - Avoid nephrotoxic agents  Anemia Thrombocytopenia - Hemoglobin 9.4 today - Baseline hemoglobin appears to be 12-13 range - Likely due to malignancy and renal dysfunction - Transfuse  PRBC for Hgb <7.0.  No transfusional intervention at this time. - Platelets low 79K.  Improving at this time.  No transfusional intervention warranted. - Continue to monitor CBC with differential  Leukocytosis - WBCs increasing 35.5. - May be due to G-CSF - Continue antibiotics as ordered and monitor CBC - Monitor fever curve   Code Status Full  Subjective:  Patient seen awake and alert sitting up in chair at bedside.  Assisted patient to bedside commode, diarrhea is ongoing.  No other acute complaints offered.  Patient's daughter at bedside.  Objective:  Vitals:   04/10/23 1100 04/10/23 1142  BP:  (!) 120/52  Pulse:  69  Resp: (!) 22 20  Temp:  98 F (36.7 C)  SpO2:  93%     Intake/Output Summary (Last 24 hours) at 04/10/2023 1445 Last data filed at 04/10/2023 0300 Gross per 24 hour  Intake 50 ml  Output --  Net 50 ml     REVIEW OF SYSTEMS:   Constitutional: + Fatigue, denies fevers, chills or abnormal night sweats Eyes: Denies blurriness of vision, double vision or watery eyes Ears, nose, mouth, throat, and face: Denies mucositis or sore throat Respiratory: Denies cough, dyspnea or wheezes Cardiovascular: Denies palpitation, chest discomfort or lower extremity swelling Gastrointestinal:  Denies nausea, heartburn + diarrhea Skin: Denies abnormal skin rashes Lymphatics: Denies new lymphadenopathy or easy bruising Neurological: Denies numbness, tingling or new weaknesses Behavioral/Psych: Mood is stable, no new changes  All other systems were reviewed with the patient and are negative.  PHYSICAL EXAMINATION: ECOG PERFORMANCE STATUS: 3 - Symptomatic, >50% confined to bed  Vitals:   04/10/23 1100 04/10/23 1142  BP:  (!) 120/52  Pulse:  69  Resp: (!) 22 20  Temp:  98  F (36.7 C)  SpO2:  93%   Filed Weights   04/08/23 0115 04/09/23 0500  Weight: 212 lb (96.2 kg) 216 lb 7.9 oz (98.2 kg)    GENERAL: alert, + mild distress and comfortable SKIN: + Pale skin  color, texture, turgor are normal, no rashes or significant lesions EYES: normal, conjunctiva are pink and non-injected, sclera clear OROPHARYNX: no exudate, no erythema and lips, buccal mucosa, and tongue normal  NECK: supple, thyroid normal size, non-tender, without nodularity LYMPH: no palpable lymphadenopathy in the cervical, axillary or inguinal LUNGS: clear to auscultation and percussion with normal breathing effort HEART: regular rate & rhythm and no murmurs and no lower extremity edema ABDOMEN: abdomen soft, non-tender and normal bowel sounds MUSCULOSKELETAL: no cyanosis of digits and no clubbing  PSYCH: alert & oriented x 3 with fluent speech NEURO: no focal motor/sensory deficits   All questions were answered. The patient knows to call the clinic with any problems, questions or concerns.   The total time spent in the appointment was 40 minutes encounter with patient including review of chart and various tests results, discussions about plan of care and coordination of care plan  Dawson Bills, NP 04/10/2023 2:45 PM    Labs Reviewed:  Lab Results  Component Value Date   WBC 35.5 (H) 04/10/2023   HGB 9.4 (L) 04/10/2023   HCT 28.4 (L) 04/10/2023   MCV 100.0 04/10/2023   PLT 79 (L) 04/10/2023   Recent Labs    03/14/23 1116 03/20/23 1235 04/08/23 0305 04/09/23 0335 04/10/23 0233  NA 139 134* 131* 132* 138  K 5.4* 5.3* 3.2* 4.3 4.1  CL 105 101 100 111 112*  CO2 27 25 18* 14* 18*  GLUCOSE 148* 138* 152* 177* 101*  BUN 45* 46* 56* 37* 28*  CREATININE 1.46* 1.54* 1.64* 1.40* 1.45*  CALCIUM 10.9* 10.9* 9.3 9.3 9.8  GFRNONAA 36* 34* 32* 38* 37*  PROT 7.2 7.0 5.3*  --   --   ALBUMIN 4.3 4.3 2.4*  --   --   AST 34 27 47*  --   --   ALT 30 26 47*  --   --   ALKPHOS 70 75 85  --   --   BILITOT 0.8 1.0 0.6  --   --     Studies Reviewed:  CT CHEST ABDOMEN PELVIS W CONTRAST Result Date: 04/08/2023 CLINICAL DATA:  Cold and flu like symptoms. Weakness, chills, sore throat,  diarrhea, nausea, fatigue. Personal history of lung cancer. * Tracking Code: BO * EXAM: CT CHEST, ABDOMEN, AND PELVIS WITH CONTRAST TECHNIQUE: Multidetector CT imaging of the chest, abdomen and pelvis was performed following the standard protocol during bolus administration of intravenous contrast. RADIATION DOSE REDUCTION: This exam was performed according to the departmental dose-optimization program which includes automated exposure control, adjustment of the mA and/or kV according to patient size and/or use of iterative reconstruction technique. CONTRAST:  60mL OMNIPAQUE IOHEXOL 350 MG/ML SOLN COMPARISON:  PET-CT 03/16/2023 FINDINGS: CT CHEST FINDINGS Cardiovascular: The heart size is normal. No substantial pericardial effusion. Coronary artery calcification is evident. Mild atherosclerotic calcification is noted in the wall of the thoracic aorta. Right Port-A-Cath tip is positioned at the SVC/RA junction. Mediastinum/Nodes: Right paratracheal node measured previously at 11 mm short axis is 7 mm short axis today on 14/3. Subcarinal lymph node measured previously at 2 cm short axis is 1.4 cm short axis today. No evidence for gross hilar lymphadenopathy although assessment is limited by the lack  of intravenous contrast on the current study. There is no axillary lymphadenopathy. The esophagus has normal imaging features. Paraesophageal varices evident. Lungs/Pleura: Right lower lobe mass measured previously at 3.4 cm is 2.7 cm today on image 80/4. Atelectasis or scarring is noted in the right lower lung. No new suspicious pulmonary nodule or mass. Musculoskeletal: No worrisome lytic or sclerotic osseous abnormality. Nonacute right rib fracture evident. CT ABDOMEN PELVIS FINDINGS Hepatobiliary: Nodular liver contour is compatible with cirrhosis. Cholecystectomy. No intrahepatic or extrahepatic biliary dilation. Pancreas: Pancreatic parenchyma is ill-defined with edema/inflammation seen around the pancreatic head and  proximal transverse duodenum (image 68-78 of series 3) in subtle peripancreatic edema in the region of the pancreatic tail (axial 63/3). No main duct dilatation. Spleen: No splenomegaly. No suspicious focal mass lesion. Adrenals/Urinary Tract: No adrenal nodule or mass. Focal scarring noted lower pole right kidney. Left kidney surgically absent. Right ureter unremarkable. The urinary bladder appears normal for the degree of distention. Stomach/Bowel: Gastric varices noted in the region of the cardia. Wall of the descending and proximal transverse duodenum is ill-defined with adjacent edema. Small bowel is otherwise unremarkable. The terminal ileum is normal. The appendix is not well visualized, but there is no edema or inflammation in the region of the cecal tip to suggest appendicitis. Diverticular changes seen in the left colon and there is subtle pericolonic edema/inflammation (axial 99/3) with some thickening of the transverse mesocolon. Vascular/Lymphatic: There is advanced atherosclerotic calcification of the abdominal aorta without aneurysm. There is no gastrohepatic or hepatoduodenal ligament lymphadenopathy. No retroperitoneal or mesenteric lymphadenopathy. No pelvic sidewall lymphadenopathy. Reproductive: Hysterectomy.  There is no adnexal mass. Other: No intraperitoneal free fluid. Musculoskeletal: No worrisome lytic or sclerotic osseous abnormality. Similar appearance of previously described 2.8 cm posterior left iliac bone lesion (image 92/3). IMPRESSION: 1. Edema/inflammation around the pancreatic head and proximal transverse duodenum with subtle peripancreatic edema in the region of the pancreatic tail. Imaging features are compatible with acute pancreatitis given changes in the pancreatic tail region. Ill definition of the descending and proximal transverse duodenum is likely secondary although duodenitis could have this appearance. Correlation with serum lipase recommended. 2. Left colonic  diverticulosis with subtle pericolonic edema/inflammation and some thickening of the transverse mesocolon. Imaging features compatible with acute diverticulitis. 3. Right lower lobe mass measured previously at 3.4 cm is 2.7 cm today. 4. Mediastinal lymphadenopathy has improved in the interval. 5. Cirrhosis with paraesophageal and gastric varices. 6. Similar appearance of previously described 2.8 cm posterior left iliac bone lesion noted to be hypermetabolic on PET imaging is suspicious for metastatic involvement. Electronically Signed   By: Kennith Center M.D.   On: 04/08/2023 09:17   CT Head Wo Contrast Result Date: 04/08/2023 CLINICAL DATA:  Neuro deficit, acute, stroke suspected EXAM: CT HEAD WITHOUT CONTRAST TECHNIQUE: Contiguous axial images were obtained from the base of the skull through the vertex without intravenous contrast. RADIATION DOSE REDUCTION: This exam was performed according to the departmental dose-optimization program which includes automated exposure control, adjustment of the mA and/or kV according to patient size and/or use of iterative reconstruction technique. COMPARISON:  MRI head March 10, 2023. FINDINGS: Brain: Small probable meningioma better evaluated on recent MRI head. No evidence of acute large vascular territory infarct, acute hemorrhage, mass lesion or midline shift. Patchy white matter hypodensities are compatible with chronic microvascular ischemic change. Vascular: No hyperdense vessel. Skull: No acute fracture. Sinuses/Orbits: Mild paranasal sinus mucosal thickening. No acute orbital findings. Other: No mastoid effusions. IMPRESSION: No evidence of  acute intracranial abnormality. Electronically Signed   By: Feliberto Harts M.D.   On: 04/08/2023 04:10   DG Chest Portable 1 View Result Date: 04/08/2023 CLINICAL DATA:  Hypoglycemia EXAM: PORTABLE CHEST 1 VIEW COMPARISON:  03/02/2023 FINDINGS: Lungs are symmetrically expanded. Linear atelectasis at the right lung base. No  pneumothorax or pleural effusion. Interval right internal jugular chest port placement with its tip within the superior vena cava. A a cardiac size within normal limits. Pulmonary vascularity is normal. Osseous structures are age-appropriate. No acute bone abnormality. IMPRESSION: 1. Right basilar atelectasis. Electronically Signed   By: Helyn Numbers M.D.   On: 04/08/2023 01:51   IR IMAGING GUIDED PORT INSERTION Result Date: 03/21/2023 INDICATION: 78 year old female with history of lung cancer requiring central venous access for chemotherapy administration. EXAM: IMPLANTED PORT A CATH PLACEMENT WITH ULTRASOUND AND FLUOROSCOPIC GUIDANCE COMPARISON:  None Available. MEDICATIONS: None. ANESTHESIA/SEDATION: Moderate (conscious) sedation was employed during this procedure. A total of Versed 1 mg and Fentanyl 50 mcg was administered intravenously. Moderate Sedation Time: 13 minutes. The patient's level of consciousness and vital signs were monitored continuously by radiology nursing throughout the procedure under my direct supervision. CONTRAST:  None FLUOROSCOPY TIME:  Three mGy COMPLICATIONS: None immediate. PROCEDURE: The procedure, risks, benefits, and alternatives were explained to the patient. Questions regarding the procedure were encouraged and answered. The patient understands and consents to the procedure. The right neck and chest were prepped with chlorhexidine in a sterile fashion, and a sterile drape was applied covering the operative field. Maximum barrier sterile technique with sterile gowns and gloves were used for the procedure. A timeout was performed prior to the initiation of the procedure. Ultrasound was used to examine the jugular vein which was compressible and free of internal echoes. A skin marker was used to demarcate the planned venotomy and port pocket incision sites. Local anesthesia was provided to these sites and the subcutaneous tunnel track with 1% lidocaine with 1:100,000  epinephrine. A small incision was created at the jugular access site and blunt dissection was performed of the subcutaneous tissues. Under ultrasound guidance, the jugular vein was accessed with a 21 ga micropuncture needle and an 0.018" wire was inserted to the superior vena cava. Real-time ultrasound guidance was utilized for vascular access including the acquisition of a permanent ultrasound image documenting patency of the accessed vessel. A 5 Fr micopuncture set was then used, through which a 0.035" Rosen wire was passed under fluoroscopic guidance into the inferior vena cava. An 8 Fr dilator was then placed over the wire. A subcutaneous port pocket was then created along the upper chest wall utilizing a combination of sharp and blunt dissection. The pocket was irrigated with sterile saline, packed with gauze, and observed for hemorrhage. A single lumen "ISP" sized power injectable port was chosen for placement. The 8 Fr catheter was tunneled from the port pocket site to the venotomy incision. The port was placed in the pocket. The external catheter was trimmed to appropriate length. The dilator was exchanged for an 8 Fr peel-away sheath under fluoroscopic guidance. The catheter was then placed through the sheath and the sheath was removed. Final catheter positioning was confirmed and documented with a fluoroscopic spot radiograph. The port was accessed with a Huber needle, aspirated, and flushed with heparinized saline. The deep dermal layer of the port pocket incision was closed with interrupted 3-0 Vicryl suture. Dermabond was then placed over the port pocket and neck incisions. The patient tolerated the procedure well without immediate  post procedural complication. FINDINGS: After catheter placement, the tip lies within the superior cavoatrial junction. The catheter aspirates and flushes normally and is ready for immediate use. IMPRESSION: Successful placement of a power injectable Port-A-Cath via the right  internal jugular vein. The catheter is ready for immediate use. Marliss Coots, MD [facility] Electronically Signed   By: Marliss Coots M.D.   On: 03/21/2023 10:33   NM PET Image Initial (PI) Skull Base To Thigh Result Date: 03/20/2023 CLINICAL DATA:  Subsequent treatment strategy for renal cell carcinoma. Evaluate pulmonary nodules. EXAM: NUCLEAR MEDICINE PET SKULL BASE TO THIGH TECHNIQUE: 10.7 mCi F-18 FDG was injected intravenously. Full-ring PET imaging was performed from the skull base to thigh after the radiotracer. CT data was obtained and used for attenuation correction and anatomic localization. Fasting blood glucose: 144 mg/dl COMPARISON:  CT 40/98/1191 FINDINGS: NECK: No hypermetabolic lymph nodes in the neck. Incidental CT findings: None. CHEST: Hypermetabolic subcarinal lymph node with SUV max 10.1 measures 20 mm on image 61/4. Hypermetabolic 10 mm RIGHT lower paratracheal node on image 51. Enlarged hypermetabolic RIGHT infrahilar node SUV max equal 9.4 on image 65. The enlarged RIGHT lobe mass measuring 3.4 cm (image 73/4) has hypermetabolic activity SUV max equal 7.1 on image 68. There is misregistration related to breathing. Incidental CT findings: None. ABDOMEN/PELVIS: Post LEFT nephrectomy. No hypermetabolic activity in the nephrectomy bed or nodularity. No hypermetabolic abdominopelvic lymph nodes. Incidental CT findings: Liver has a lobular contour. RIGHT kidney appears normal. Cryoablation site of the RIGHT kidney without radiotracer activity. SKELETON: Moderate radiotracer activity within the LEFT iliac bone with SUV max equal 7.3 on image 143. There is subtle sclerotic lesion measuring 2.7 cm on the CT. Metabolic activity associated with a RIGHT lateral rib fracture on image 64 SUV max equal 6. Incidental CT findings: None. IMPRESSION: 1. Hypermetabolic mediastinal and RIGHT hilar lymph nodes consistent with nodal metastasis. 2.  Hypermetabolic activity associated with the RIGHT lower lobe mass consistent with metastatic disease. 3. Hypermetabolic LEFT iliac bone lesion is concerning for osseous metastasis. 4. Post left nephrectomy without evidence local recurrence. 5. Cryoablation site of the RIGHT kidney without evidence of local recurrence. 6. Metabolic activity associated with a RIGHT lateral rib fracture is favored posttraumatic. Recommend clinical correlation Electronically Signed   By: Genevive Bi M.D.   On: 03/20/2023 09:40    ADDENDUM: I saw and examined Ms. Lowrey this morning.  She looks pretty good.  She is having problems with some diarrhea and some bowel incontinence.  On her CT scan, looks like it may be some diverticulitis.  She has some change consistent with pancreatitis.  However, her lipase was only 62.  I think what is important is that the small cell lung cancer certainly seems to be responding to treatment.  I am not in chemotherapy as a factor in all of this.  I am sure that it is compromised her immune system.  Thankfully, she has not neutropenic.  She is on clear liquids right now.  She has a coronavirus variant.  I know that this qualifies her as having COVID.  The CT scan did not show any obvious pneumonia.  Sounds like she had hypoglycemia.  This morning, her blood sugar was 107.  So far, blood cultures  She is on antibiotics.  CBC shows a white cell count of 30.7.  Hemoglobin 9.2.  Blood count 100,000.  She had been, white cell count was 14.2.  Platelet count 41,000.  As such, I suspect that her marrow is recovering from the chemotherapy.  I have I told her that because she is responding already, we have flexibility as well we can do the next week.  Really need to get her through this episode.  We will follow along.  I know that she will get incredible care from everybody up on [facility] W.  Christin Bach, MD  [place] 14:14

## 2023-04-10 NOTE — Evaluation (Signed)
 Physical Therapy Evaluation Patient Details Name: Amanda Crawford MRN: [id number] DOB: 07-Apr-1945 Today's Date: 04/10/2023  History of Present Illness  Amanda Crawford is a 78 y.o. female with a history of type 2 diabetes, lung cancer on chemotherapy (small cell carcinoma), renal cell carcinoma and left bundle branch block who presents to the ED 04/08/23  for altered mental status.  Patient recently treated for community-acquired pneumonia with amoxicillin.  She is experienced cough and congestion over the last 2 days.  She was also found to have a UTI recently.  Has taken amoxicillin for 2 days.  Family noted her to be exhibiting signs of slurred speech and global confusion.   Clinical Impression  Patient received in bed, just got cleaned up by nursing staff. Patient is agreeable to PT assessment. She is mod I with bed mobility. Transfers with cga. Ambulated with min +2 hand held A. 25 feet. Patient moving well. Some hesitancy with mobility. She will continue to benefit from skilled PT to improve independence and activity tolerance.          If plan is discharge home, recommend the following: A little help with walking and/or transfers;A little help with bathing/dressing/bathroom;Assist for transportation;Assistance with cooking/housework   Can travel by private vehicle    yes    Equipment Recommendations BSC/3in1  Recommendations for Other Services       Functional Status Assessment Patient has had a recent decline in their functional status and demonstrates the ability to make significant improvements in function in a reasonable and predictable amount of time.     Precautions / Restrictions Precautions Precautions: Fall Restrictions Weight Bearing Restrictions Per Provider Order: No      Mobility  Bed Mobility Overal bed mobility: Modified Independent             General bed mobility comments: increased time needed, no physical assist    Transfers Overall transfer level:  Needs assistance Equipment used: None Transfers: Sit to/from Stand Sit to Stand: Contact guard assist                Ambulation/Gait Ambulation/Gait assistance: Min assist, +2 physical assistance Gait Distance (Feet): 25 Feet Assistive device: 2 person hand held assist Gait Pattern/deviations: Step-through pattern, Decreased step length - right, Decreased step length - left, Decreased stride length Gait velocity: decr     General Gait Details: patient ambulated in room with light UE support. patient with sob during activity. family reports this is not new for her.  Stairs            Wheelchair Mobility     Tilt Bed    Modified Rankin (Stroke Patients Only)       Balance Overall balance assessment: Mild deficits observed, not formally tested                                           Pertinent Vitals/Pain Pain Assessment Pain Assessment: No/denies pain    Home Living Family/patient expects to be discharged to:: Private residence Living Arrangements: Spouse/significant other Available Help at Discharge: Family;Available 24 hours/day Type of Home: House Home Access: Level entry       Home Layout: Two level;Able to live on main level with bedroom/bathroom Home Equipment: Rolling Walker (2 wheels)      Prior Function Prior Level of Function : Independent/Modified Independent;Driving  Extremity/Trunk Assessment   Upper Extremity Assessment Upper Extremity Assessment: Defer to OT evaluation    Lower Extremity Assessment Lower Extremity Assessment: Generalized weakness    Cervical / Trunk Assessment Cervical / Trunk Assessment: Normal  Communication   Communication Communication: No apparent difficulties    Cognition Arousal: Alert Behavior During Therapy: WFL for tasks assessed/performed   PT - Cognitive impairments: No apparent impairments                         Following commands:  Intact       Cueing Cueing Techniques: Verbal cues     General Comments      Exercises     Assessment/Plan    PT Assessment Patient needs continued PT services  PT Problem List Decreased strength;Decreased activity tolerance;Decreased mobility;Cardiopulmonary status limiting activity       PT Treatment Interventions DME instruction;Gait training;Functional mobility training;Therapeutic activities;Therapeutic exercise;Balance training;Neuromuscular re-education;Patient/family education    PT Goals (Current goals can be found in the Care Plan section)  Acute Rehab PT Goals Patient Stated Goal: return home PT Goal Formulation: With patient/family Time For Goal Achievement: 04/17/23 Potential to Achieve Goals: Good    Frequency Min 1X/week     Co-evaluation               AM-PAC PT "6 Clicks" Mobility  Outcome Measure Help needed turning from your back to your side while in a flat bed without using bedrails?: None Help needed moving from lying on your back to sitting on the side of a flat bed without using bedrails?: None Help needed moving to and from a bed to a chair (including a wheelchair)?: A Little Help needed standing up from a chair using your arms (e.g., wheelchair or bedside chair)?: A Little Help needed to walk in hospital room?: A Little Help needed climbing 3-5 steps with a railing? : A Little 6 Click Score: 20    End of Session   Activity Tolerance: Patient limited by fatigue Patient left: in chair;with call bell/phone within reach;with family/visitor present Nurse Communication: Mobility status PT Visit Diagnosis: Muscle weakness (generalized) (M62.81);Other abnormalities of gait and mobility (R26.89)    Time: 5621-3086 PT Time Calculation (min) (ACUTE ONLY): 22 min   Charges:   PT Evaluation $PT Eval Moderate Complexity: 1 Mod   PT General Charges $$ ACUTE PT VISIT: 1 Visit        Johnney Scarlata, PT, GCS 04/10/23,2:54 PM

## 2023-04-10 NOTE — Progress Notes (Signed)
 PROGRESS NOTE    Amanda Crawford  UEA:[id number] DOB: 1945-06-25 DOA: 04/08/2023 PCP: [name], MD    Brief Narrative:  78 year old woman w/ hx of small cell lung cancer recently started on chemo, prior renal cell cancer w isolated met s/p left lung wedge resection for met and nephrectomy, CKD, IDDM, chronic GI issues presenting with increasing confusion and GI upset.  GI upset is primarily manifested by diarrhea and inability to hold in long enough to get to toilet. This has been an issue for her in the past.  Carries diagnosis of recurrent diverticulitis.  Workup in ER notable for very low blood glucose, shock state, and abnormal CT C/A/P: query pancreatitis, query colitis  tx to [facility] from PCCM 3/10   Assessment and Plan:  Hypovolemic and septic shock source secondary duodenitis and diverticulitis and immunocompromise patient. Duodenitis/diverticulitis Coronavirus variant URI -No further requirement of pressor support, blood pressure has stabilized .  remains off Levophed -Workup Significant for duodenitis and diverticulitis . -Continue with IV Zosyn -Evidence of pancreatic edema on imaging, this is most likely related to duodenitis, repeat level minimally elevated (less than her baseline). -She is still reporting abdominal pain, and constipation, will start on stool softener, continue with clear liquid diet for now  Leuckocyocis -Recurrent elevated leukocytosis, this is most likely due to filgrastim received on Friday post chemo -Continue to trend procalcitonin  Hypoglycemia -off glucose ggt -monitor with accuchecks  Hyponatremia -Due to volume depletion and dehydration, improved with IV fluids  Coronavirus variant -Dyspnea, hypoxia, not pneumonia on imaging, she was encouraged to use incentive spirometer  SCLC with mets to hip  -Management per oncology, she is following with Dr. Myna Hidalgo, she is currently on chemotherapy/immunotherapy  Cirrhosis - she is not aware of  this diagnosis: Cirrhosis with paraesophageal and gastric varices seen on CT scan.  -outpatient follow up  Thrombocytopenia -Due to recent chemotherapy, trending up  CKD stage IIIb -baseline around 1.4  Hypophosphatemia -Replaced  HTN -hold BP Meds for now  Generalized weakness Deconditioning -will consult PT, OT   DVT prophylaxis: Place and maintain sequential compression device Start: 04/10/23 0628 SCDs Start: 04/08/23 1036    Code Status: Full Code   Disposition Plan:  Level of care: Progressive Status is: Inpatient     Consultants:  PCCM  Subjective:  Complains of abdominal cramps, reports no BM for few days, reports overall she is feeling better, but still feeling weak, frail .  Objective: Vitals:   04/09/23 2307 04/09/23 2314 04/10/23 0430 04/10/23 0807  BP: (!) 120/53  (!) 123/53 (!) 117/54  Pulse: (!) 105 (!) 103 97 99  Resp: 18 20 20 20   Temp:  98.2 F (36.8 C) 97.9 F (36.6 C) (!) 97.3 F (36.3 C)  TempSrc:  Oral Oral Oral  SpO2: 94% 95% 94%   Weight:      Height:        Intake/Output Summary (Last 24 hours) at 04/10/2023 1108 Last data filed at 04/10/2023 0300 Gross per 24 hour  Intake 99.37 ml  Output --  Net 99.37 ml   Filed Weights   04/08/23 0115 04/09/23 0500  Weight: 96.2 kg 98.2 kg    Examination:   Awake Alert, Oriented X 3, frail, deconditioned, appears older than stated age Symmetrical Chest wall movement, Good air movement bilaterally, CTAB RRR,No Gallops,Rubs or new Murmurs, No Parasternal Heave +ve B.Sounds, Abd Soft, mild diffuse tenderness to palpation, but no rebound or guarding No Cyanosis, Clubbing or  edema, No new Rash or bruise       Data Reviewed: I have personally reviewed following labs and imaging studies  CBC: Recent Labs  Lab 04/08/23 0305 04/09/23 0335 04/10/23 0233  WBC 14.2* 26.8* 35.5*  NEUTROABS 9.6*  --   --   HGB 10.4* 9.6* 9.4*  HCT 30.5* 29.7* 28.4*  MCV 97.8 103.1* 100.0  PLT 41*  59* 79*   Basic Metabolic Panel: Recent Labs  Lab 04/08/23 0305 04/09/23 0335 04/10/23 0233  NA 131* 132* 138  K 3.2* 4.3 4.1  CL 100 111 112*  CO2 18* 14* 18*  GLUCOSE 152* 177* 101*  BUN 56* 37* 28*  CREATININE 1.64* 1.40* 1.45*  CALCIUM 9.3 9.3 9.8  MG  --  1.7 2.1  PHOS  --  2.4* 2.2*   GFR: Estimated Creatinine Clearance: 35.8 mL/min (A) (by C-G formula based on SCr of 1.45 mg/dL (H)). Liver Function Tests: Recent Labs  Lab 04/08/23 0305  AST 47*  ALT 47*  ALKPHOS 85  BILITOT 0.6  PROT 5.3*  ALBUMIN 2.4*   Recent Labs  Lab 04/08/23 0940  LIPASE 62*   No results for input(s): "AMMONIA" in the last 168 hours. Coagulation Profile: No results for input(s): "INR", "PROTIME" in the last 168 hours. Cardiac Enzymes: No results for input(s): "CKTOTAL", "CKMB", "CKMBINDEX", "TROPONINI" in the last 168 hours. BNP (last 3 results) No results for input(s): "PROBNP" in the last 8760 hours. HbA1C: No results for input(s): "HGBA1C" in the last 72 hours. CBG: Recent Labs  Lab 04/09/23 0533 04/09/23 0744 04/09/23 1108 04/09/23 2112 04/10/23 0808  GLUCAP 160* 155* 133* 109* 80   Lipid Profile: No results for input(s): "CHOL", "HDL", "LDLCALC", "TRIG", "CHOLHDL", "LDLDIRECT" in the last 72 hours. Thyroid Function Tests: No results for input(s): "TSH", "T4TOTAL", "FREET4", "T3FREE", "THYROIDAB" in the last 72 hours. Anemia Panel: No results for input(s): "VITAMINB12", "FOLATE", "FERRITIN", "TIBC", "IRON", "RETICCTPCT" in the last 72 hours. Sepsis Labs: Recent Labs  Lab 04/08/23 0313 04/08/23 0530 04/09/23 0335 04/10/23 0233  PROCALCITON  --   --  1.19 1.01  LATICACIDVEN 2.4* 1.8  --   --     Recent Results (from the past 240 hours)  Resp panel by RT-PCR (RSV, Flu A&B, Covid) Anterior Nasal Swab     Status: None   Collection Time: 04/08/23  4:11 AM   Specimen: Anterior Nasal Swab  Result Value Ref Range Status   SARS Coronavirus 2 by RT PCR NEGATIVE  NEGATIVE Final   Influenza A by PCR NEGATIVE NEGATIVE Final   Influenza B by PCR NEGATIVE NEGATIVE Final    Comment: (NOTE) The Xpert Xpress SARS-CoV-2/FLU/RSV plus assay is intended as an aid in the diagnosis of influenza from Nasopharyngeal swab specimens and should not be used as a sole basis for treatment. Nasal washings and aspirates are unacceptable for Xpert Xpress SARS-CoV-2/FLU/RSV testing.  Fact Sheet for Patients: BloggerCourse.com  Fact Sheet for Healthcare Providers: SeriousBroker.it  This test is not yet approved or cleared by the [place] FDA and has been authorized for detection and/or diagnosis of SARS-CoV-2 by FDA under an Emergency Use Authorization (EUA). This EUA will remain in effect (meaning this test can be used) for the duration of the COVID-19 declaration under Section 564(b)(1) of the Act, 21 U.S.C. section 360bbb-3(b)(1), unless the authorization is terminated or revoked.     Resp Syncytial Virus by PCR NEGATIVE NEGATIVE Final    Comment: (NOTE) Fact Sheet for Patients: BloggerCourse.com  Fact  Sheet for Healthcare Providers: SeriousBroker.it  This test is not yet approved or cleared by the [place] and has been authorized for detection and/or diagnosis of SARS-CoV-2 by FDA under an Emergency Use Authorization (EUA). This EUA will remain in effect (meaning this test can be used) for the duration of the COVID-19 declaration under Section 564(b)(1) of the Act, 21 U.S.C. section 360bbb-3(b)(1), unless the authorization is terminated or revoked.  Performed at [facility], [address] [place], [place] [address]   Culture, blood (routine x 2)     Status: None (Preliminary result)   Collection Time: 04/08/23  6:22 AM   Specimen: BLOOD RIGHT HAND  Result Value Ref Range Status   Specimen Description BLOOD RIGHT HAND  Final    Special Requests   Final    BOTTLES DRAWN AEROBIC AND ANAEROBIC Blood Culture results may not be optimal due to an inadequate volume of blood received in culture bottles   Culture   Final    NO GROWTH 2 DAYS Performed at [facility], [address], [place], [place] [address]    Report Status PENDING  Incomplete  Respiratory (~20 pathogens) panel by PCR     Status: Abnormal   Collection Time: 04/08/23 10:36 AM   Specimen: Nasopharyngeal Swab; Respiratory  Result Value Ref Range Status   Adenovirus NOT DETECTED NOT DETECTED Final   Coronavirus 229E NOT DETECTED NOT DETECTED Final    Comment: (NOTE) The Coronavirus on the Respiratory Panel, DOES NOT test for the novel  Coronavirus (2019 nCoV)    Coronavirus HKU1 NOT DETECTED NOT DETECTED Final   Coronavirus NL63 NOT DETECTED NOT DETECTED Final   Coronavirus OC43 DETECTED (A) NOT DETECTED Final   Metapneumovirus NOT DETECTED NOT DETECTED Final   Rhinovirus / Enterovirus NOT DETECTED NOT DETECTED Final   Influenza A NOT DETECTED NOT DETECTED Final   Influenza B NOT DETECTED NOT DETECTED Final   Parainfluenza Virus 1 NOT DETECTED NOT DETECTED Final   Parainfluenza Virus 2 NOT DETECTED NOT DETECTED Final   Parainfluenza Virus 3 NOT DETECTED NOT DETECTED Final   Parainfluenza Virus 4 NOT DETECTED NOT DETECTED Final   Respiratory Syncytial Virus NOT DETECTED NOT DETECTED Final   Bordetella pertussis NOT DETECTED NOT DETECTED Final   Bordetella Parapertussis NOT DETECTED NOT DETECTED Final   Chlamydophila pneumoniae NOT DETECTED NOT DETECTED Final   Mycoplasma pneumoniae NOT DETECTED NOT DETECTED Final    Comment: Performed at [facility], [address] [place], [place] [address]  MRSA Next Gen by PCR, Nasal     Status: None   Collection Time: 04/08/23  1:09 PM   Specimen: Nasal Mucosa; Nasal Swab  Result Value Ref Range Status   MRSA by PCR Next Gen NOT DETECTED NOT DETECTED Final    Comment: (NOTE) The GeneXpert MRSA  Assay (FDA approved for NASAL specimens only), is one component of a comprehensive MRSA colonization surveillance program. It is not intended to diagnose MRSA infection nor to guide or monitor treatment for MRSA infections. Test performance is not FDA approved in patients less than 68 years old. Performed at [facility], [address], [place], [place] [address]   Culture, blood (routine x 2)     Status: None (Preliminary result)   Collection Time: 04/08/23  1:26 PM   Specimen: BLOOD RIGHT HAND  Result Value Ref Range Status   Specimen Description BLOOD RIGHT HAND  Final   Special Requests  Final    BOTTLES DRAWN AEROBIC AND ANAEROBIC Blood Culture results may not be optimal due to an inadequate volume of blood received in culture bottles   Culture   Final    NO GROWTH 2 DAYS Performed at [facility], [address], [place], [place] [address]    Report Status PENDING  Incomplete         Radiology Studies: No results found.       Scheduled Meds:  Chlorhexidine Gluconate Cloth  6 each Topical Daily   insulin aspart  0-6 Units Subcutaneous TID WC   leptospermum manuka honey  1 Application Topical Daily   liver oil-zinc oxide   Topical TID   pantoprazole (PROTONIX) IV  40 mg Intravenous QHS   phosphorus  500 mg Oral TID   [START ON 04/11/2023] polyethylene glycol  17 g Oral Daily   sodium chloride flush  10-40 mL Intracatheter Q12H   Continuous Infusions:  piperacillin-tazobactam (ZOSYN)  IV 3.375 g (04/10/23 0506)     LOS: 2 days    Time spent: 50 minutes spent on chart review, discussion with nursing staff, consultants, updating family and interview/physical exam; more than 50% of that time was spent in counseling and/or coordination of care.    Huey Bienenstock, MD [facility] Available via Epic secure chat 7am-7pm After these hours, please refer to coverage provider listed on [email or web address] 04/10/2023, 11:08 AM

## 2023-04-10 NOTE — Evaluation (Signed)
 Occupational Therapy Evaluation Patient Details Name: Amanda Crawford MRN: [id number] DOB: Sep 29, 1945 Today's Date: 04/10/2023   History of Present Illness   Amanda Crawford is a 78 y.o. female who presents to the ED 04/08/23 for AMS, GI upset and slurred speech. Workup in ER notable for very low blood glucose, shock state, and abnormal CT C/A/P: query pancreatitis, query colitis. PMHx: type 2 diabetes, lung cancer on chemotherapy (small cell carcinoma), renal cell carcinoma and left bundle branch block; recently treated for PNA and UTI     Clinical Impressions Amanda Crawford was evaluated s/p the above admission list. She is indep and lives with her spouse at baseline. Upon evaluation the pt was limited by generalized weakness, GI upset, SOB with activity and limited activity tolerance. Overall she needed up to CHG for transfers and mobility without a device. Due to the deficits listed below the pt also needs up to min A for LB ADLs and set up A for UB ADLs. Pt will benefit from continued acute OT services and discharge home with support of family.       If plan is discharge home, recommend the following:   Assistance with cooking/housework;Assist for transportation     Functional Status Assessment   Patient has had a recent decline in their functional status and demonstrates the ability to make significant improvements in function in a reasonable and predictable amount of time.     Equipment Recommendations   Other (comment) (pt's dtr planning to purchase shower chair)      Precautions/Restrictions   Precautions Precautions: Fall Restrictions Weight Bearing Restrictions Per Provider Order: No     Mobility Bed Mobility Overal bed mobility: Modified Independent                  Transfers Overall transfer level: Needs assistance Equipment used: None Transfers: Sit to/from Stand Sit to Stand: Contact guard assist                  Balance Overall balance  assessment: Mild deficits observed, not formally tested             ADL either performed or assessed with clinical judgement   ADL Overall ADL's : Needs assistance/impaired Eating/Feeding: Independent   Grooming: Supervision/safety;Standing   Upper Body Bathing: Set up;Sitting   Lower Body Bathing: Minimal assistance;Sit to/from stand   Upper Body Dressing : Set up;Sitting   Lower Body Dressing: Minimal assistance;Sit to/from stand   Toilet Transfer: Supervision/safety;Contact guard assist   Toileting- Clothing Manipulation and Hygiene: Supervision/safety;Sitting/lateral lean       Functional mobility during ADLs: Contact guard assist General ADL Comments: SOB noted with activity, limited activity tolerance, min A for LB ADLs, cues for pacing     Vision Baseline Vision/History: 1 Wears glasses Vision Assessment?: No apparent visual deficits     Perception Perception: Within Functional Limits       Praxis Praxis: WFL       Pertinent Vitals/Pain Pain Assessment Pain Assessment: No/denies pain     Extremity/Trunk Assessment Upper Extremity Assessment Upper Extremity Assessment: Generalized weakness;Overall [facility] for tasks assessed   Lower Extremity Assessment Lower Extremity Assessment: Defer to PT evaluation   Cervical / Trunk Assessment Cervical / Trunk Assessment: Normal   Communication Communication Communication: No apparent difficulties   Cognition Arousal: Alert Behavior During Therapy: WFL for tasks assessed/performed Cognition: No apparent impairments  Following commands: Intact       Cueing  General Comments      VSS, family present, SOB with activity           Home Living Family/patient expects to be discharged to:: Private residence Living Arrangements: Spouse/significant other Available Help at Discharge: Family;Available 24 hours/day Type of Home: House Home Access: Level entry      Home Layout: Two level;Able to live on main level with bedroom/bathroom     Bathroom Shower/Tub: Walk-in shower         Home Equipment: Agricultural consultant (2 wheels)          Prior Functioning/Environment Prior Level of Function : Independent/Modified Independent;Driving             Mobility Comments: no AD ADLs Comments: indep ADL/IADLs    OT Problem List: Decreased activity tolerance;Decreased safety awareness   OT Treatment/Interventions: Self-care/ADL training;Therapeutic exercise;DME and/or AE instruction;Therapeutic activities;Balance training;Patient/family education      OT Goals(Current goals can be found in the care plan section)   Acute Rehab OT Goals Patient Stated Goal: to feel better OT Goal Formulation: With patient Time For Goal Achievement: 04/24/23 Potential to Achieve Goals: Good ADL Goals Pt Will Perform Lower Body Dressing: Independently;sit to/from stand Pt Will Transfer to Toilet: Independently;ambulating Additional ADL Goal #1: Pt will demonstrated improved activity tolerance by completing at least 10 minutes of OOB activity without need for sitting rest break   OT Frequency:  Min 1X/week       AM-PAC OT "6 Clicks" Daily Activity     Outcome Measure Help from another person eating meals?: None Help from another person taking care of personal grooming?: A Little Help from another person toileting, which includes using toliet, bedpan, or urinal?: A Little Help from another person bathing (including washing, rinsing, drying)?: A Little Help from another person to put on and taking off regular upper body clothing?: A Little Help from another person to put on and taking off regular lower body clothing?: A Little 6 Click Score: 19   End of Session Nurse Communication: Mobility status  Activity Tolerance: Patient tolerated treatment well Patient left: in chair;with call bell/phone within reach;with family/visitor present  OT Visit  Diagnosis: Unsteadiness on feet (R26.81)                Time: 1610-9604 OT Time Calculation (min): 19 min Charges:  OT General Charges $OT Visit: 1 Visit OT Evaluation $OT Eval Moderate Complexity: 1 Mod  Amanda Crawford, OTR/L Acute Rehabilitation Services Office [phone] Secure Chat Communication Preferred   Donia Pounds 04/10/2023, 3:37 PM

## 2023-04-11 ENCOUNTER — Inpatient Hospital Stay: Payer: No Typology Code available for payment source

## 2023-04-11 ENCOUNTER — Inpatient Hospital Stay: Payer: No Typology Code available for payment source | Admitting: Medical Oncology

## 2023-04-11 DIAGNOSIS — R579 Shock, unspecified: Secondary | ICD-10-CM | POA: Diagnosis not present

## 2023-04-11 DIAGNOSIS — C342 Malignant neoplasm of middle lobe, bronchus or lung: Secondary | ICD-10-CM | POA: Diagnosis not present

## 2023-04-11 LAB — BASIC METABOLIC PANEL
Anion gap: 10 (ref 5–15)
BUN: 23 mg/dL (ref 8–23)
CO2: 17 mmol/L — ABNORMAL LOW (ref 22–32)
Calcium: 9.8 mg/dL (ref 8.9–10.3)
Chloride: 112 mmol/L — ABNORMAL HIGH (ref 98–111)
Creatinine, Ser: 1.27 mg/dL — ABNORMAL HIGH (ref 0.44–1.00)
GFR, Estimated: 43 mL/min — ABNORMAL LOW (ref >60)
Glucose, Bld: 107 mg/dL — ABNORMAL HIGH (ref 70–99)
Potassium: 3.9 mmol/L (ref 3.5–5.1)
Sodium: 139 mmol/L (ref 135–145)

## 2023-04-11 LAB — MAGNESIUM: Magnesium: 1.9 mg/dL (ref 1.7–2.4)

## 2023-04-11 LAB — CBC
HCT: 27.7 % — ABNORMAL LOW (ref 36.0–46.0)
Hemoglobin: 9.2 g/dL — ABNORMAL LOW (ref 12.0–15.0)
MCH: 33 pg (ref 26.0–34.0)
MCHC: 33.2 g/dL (ref 30.0–36.0)
MCV: 99.3 fL (ref 80.0–100.0)
Platelets: 100 10*3/uL — ABNORMAL LOW (ref 150–400)
RBC: 2.79 MIL/uL — ABNORMAL LOW (ref 3.87–5.11)
RDW: 14.2 % (ref 11.5–15.5)
WBC: 30.7 10*3/uL — ABNORMAL HIGH (ref 4.0–10.5)
nRBC: 0.1 % (ref 0.0–0.2)

## 2023-04-11 LAB — GLUCOSE, CAPILLARY
Glucose-Capillary: 138 mg/dL — ABNORMAL HIGH (ref 70–99)
Glucose-Capillary: 137 mg/dL — ABNORMAL HIGH (ref 70–99)
Glucose-Capillary: 164 mg/dL — ABNORMAL HIGH (ref 70–99)
Glucose-Capillary: 344 mg/dL — ABNORMAL HIGH (ref 70–99)
Glucose-Capillary: 121 mg/dL — ABNORMAL HIGH (ref 70–99)

## 2023-04-11 LAB — PHOSPHORUS: Phosphorus: 3.6 mg/dL (ref 2.5–4.6)

## 2023-04-11 LAB — LEGIONELLA PNEUMOPHILA SEROGP 1 UR AG: L. pneumophila Serogp 1 Ur Ag: NEGATIVE

## 2023-04-11 MED ORDER — RISAQUAD PO CAPS
1.0000 | ORAL_CAPSULE | Freq: Every day | ORAL | Status: DC
  Administered 2023-04-11 – 2023-04-12 (×2): 1 via ORAL
  Filled 2023-04-11 (×2): qty 1

## 2023-04-11 MED ORDER — ZOLPIDEM TARTRATE 5 MG PO TABS
5.0000 mg | ORAL_TABLET | Freq: Every day | ORAL | Status: DC
  Administered 2023-04-11: 5 mg via ORAL
  Filled 2023-04-11: qty 1

## 2023-04-11 MED ORDER — PANTOPRAZOLE SODIUM 40 MG PO TBEC
40.0000 mg | DELAYED_RELEASE_TABLET | Freq: Every day | ORAL | Status: DC
  Administered 2023-04-11 – 2023-04-12 (×2): 40 mg via ORAL
  Filled 2023-04-11 (×2): qty 1

## 2023-04-11 NOTE — Progress Notes (Addendum)
   04/11/23 1015  Mobility  Activity Transferred to/from [facility]  Level of Assistance Minimal assist, patient does 75% or more  Assistive Device Other (Comment) (HHA)  Distance Ambulated (ft) 5 ft  Range of Motion/Exercises All extremities  Activity Response Tolerated fair  Mobility Referral Yes  Mobility visit 1 Mobility  Mobility Specialist Start Time (ACUTE ONLY) 0958  Mobility Specialist Stop Time (ACUTE ONLY) 1015  Mobility Specialist Time Calculation (min) (ACUTE ONLY) 17 min   Mobility Specialist: Progress Note Visits:3  Pre-Mobility:      HR 106, SpO2 94% RA During Mobility: HR 113, SpO2 92% RA Post-Mobility:    HR 103, SpO2 94% RA  Pt agreeable to mobility session - received in chair as she just transferred with nursing staff. C/o  BR urgency and feeling winded, VSS. Pt voided upon STS asking to sit on BSC. Void and small light brown BM complete, peri care completed with assistance.  Returned to chair with all needs met - call bell within reach. Husband present.  ~~~~~~~~~~~~~~~~~~~~~~~~~~~~~~~~~~~~~~~~~~~~~~~~~~~~~~~~~~~~~~~~~~~~~~~~ Pt agreeable to mobility session- received on BSC. C/o bottom soreness. Pt with void and BM, pericare completed with assistance by NT. Pt deferred walking but agreeable to chair exercises: STS (x12), LLE extensions (x10), ankle circles (x10), and ankle pumps (x10). Returned to chair with all needs met - call bell within reach. Husband present.  ~~~~~~~~~~~~~~~~~~~~~~~~~~~~~~~~~~~~~~~~~~~~~~~~~~~~~~~~~~~~~~~~~~~~~~~~ Post-Mobility:    HR 110, SpO2 96%  Pt agreeable to mobility session requesting to go back to bed - received in chair. Pt was asymptomatic throughout session with no complaints. Returned to bed with all needs met - call bell within reach. RN, Husband, and daughter present.  ~~~~~~~~~~~~~~~~~~~~~~~~~~~~~~~~~~~~~~~~~~~~~~~~~~~~~~~~~~~~~~~~~~~~~~~~ Barnie Mort, BS Mobility Specialist Please contact via SecureChat or  Rehab office at  [phone].

## 2023-04-11 NOTE — Progress Notes (Addendum)
 PROGRESS NOTE    Amanda Crawford  EAV:[id number] DOB: [birth year] DOA: 04/08/2023 PCP: [name], MD    Brief Narrative:   78 year old female with past medical history of  small cell lung cancer recently started on chemo, prior renal cell cancer with isolated mets s/p left lung wedge resection f and nephrectomy, CKD, IDDM, chronic GI issues presented to hospital with confusion and diarrhea.  She has had diarrhea in the past as well.  Was admitted hospital for recurrent diverticulitis.  Initially patient had low blood glucose level and was hypotensive.  CT scan of the abdomen was notable for diverticulitis with query pancreatitis.  Patient was initially admitted to the ICU and was subsequently transferred to medical service.   Assessment and Plan:  Hypovolemic and septic shock source secondary duodenitis and diverticulitis  Coronavirus variant URI Patient was initially in the ICU on vasopressors.  On IV Zosyn.  Evidence of pancreatic edema but likely secondary to duodenitis, still complains of loose stools.  Off stool softeners.  Plan to change to Augmentin on discharge.  Leuckocyocis Likely secondary to recent filgrastim.  Recent procalcitonin of 1.0  Diabetes mellitus type 2.   Hypoglycemia Has improved at this time.  Latest POC glucose of 138.  Patient is on glimepiride and Semglee at home.  Currently on hold.  Hyponatremia Improved after hydration.  Latest sodium of 139.  Coronavirus variant Not hypoxic.  No infiltrate.  Encouraged incentive spirometry.  SCLC with mets to hip  -Management per oncology, she is following with Dr. Myna Hidalgo, she is currently on chemotherapy/immunotherapy  Cirrhosis - she is not aware of this diagnosis: Cirrhosis with paraesophageal and gastric varices seen on CT scan.  Will need outpatient follow-up.  Thrombocytopenia -Due to recent chemotherapy, latest platelet count of 100.  No evidence of bleeding.  CKD stage IIIb -baseline creatinine  around 1.4.  Creatinine today at 1.2.  Hypophosphatemia Replenished and improved.  Latest phosphorus of 3.6.  HTN Patient is on Avapro, Coreg at home.  Currently on hold  Generalized weakness Deconditioning PT OT has recommended home health on discharge.  Will continue out of bed to chair and mobilize the patient.   DVT prophylaxis: Place and maintain sequential compression device Start: 04/10/23 0628 SCDs Start: 04/08/23 1036    Code Status: Full Code   Disposition Plan: Likely home with home health in 1 to 2 days.  Family communication:  Spoke with the patient's husband at bedside.  Level of care: Progressive Status is: Inpatient   Consultants:  PCCM  Subjective:  Today, patient was seen and examined at bedside.  States that she feels a little stronger today.  No nausea vomiting today but had some cramps and loose bowel movement.  Had taken stool softener yesterday for constipation.  Objective: Vitals:   04/11/23 0604 04/11/23 0700 04/11/23 0737 04/11/23 1117  BP:   (!) 135/51 112/88  Pulse:   (!) 102 (!) 107  Resp: 20  20 15   Temp:   98.2 F (36.8 C) 98.1 F (36.7 C)  TempSrc:   Oral Oral  SpO2:   93% 93%  Weight:  99.9 kg    Height:       No intake or output data in the 24 hours ending 04/11/23 1119  Filed Weights   04/08/23 0115 04/09/23 0500 04/11/23 0700  Weight: 96.2 kg 98.2 kg 99.9 kg    Physical examination:  General:  Average built, not in obvious distress, appears deconditioned.  On room  air HENT:   No scleral pallor or icterus noted. Oral mucosa is moist.  Chest:  Clear breath sounds.  No crackles or wheezes.  CVS: S1 &S2 heard. No murmur.  Regular rate and rhythm. Abdomen: Soft, nontender, nondistended.  Bowel sounds are heard.  Left lower quadrant tenderness on palpation Extremities: No cyanosis, clubbing or edema.  Peripheral pulses are palpable. Psych: Alert, awake and oriented, normal mood CNS:  No cranial nerve deficits.  Power equal  in all extremities.   Skin: Warm and dry.  No rashes noted.  Data Reviewed: I have personally reviewed following labs and imaging studies  CBC: Recent Labs  Lab 04/08/23 0305 04/09/23 0335 04/10/23 0233 04/11/23 0242  WBC 14.2* 26.8* 35.5* 30.7*  NEUTROABS 9.6*  --   --   --   HGB 10.4* 9.6* 9.4* 9.2*  HCT 30.5* 29.7* 28.4* 27.7*  MCV 97.8 103.1* 100.0 99.3  PLT 41* 59* 79* 100*   Basic Metabolic Panel: Recent Labs  Lab 04/08/23 0305 04/09/23 0335 04/10/23 0233 04/11/23 0242  NA 131* 132* 138 139  K 3.2* 4.3 4.1 3.9  CL 100 111 112* 112*  CO2 18* 14* 18* 17*  GLUCOSE 152* 177* 101* 107*  BUN 56* 37* 28* 23  CREATININE 1.64* 1.40* 1.45* 1.27*  CALCIUM 9.3 9.3 9.8 9.8  MG  --  1.7 2.1 1.9  PHOS  --  2.4* 2.2* 3.6   GFR: Estimated Creatinine Clearance: 41.3 mL/min (A) (by C-G formula based on SCr of 1.27 mg/dL (H)). Liver Function Tests: Recent Labs  Lab 04/08/23 0305  AST 47*  ALT 47*  ALKPHOS 85  BILITOT 0.6  PROT 5.3*  ALBUMIN 2.4*   Recent Labs  Lab 04/08/23 0940  LIPASE 62*   No results for input(s): "AMMONIA" in the last 168 hours. Coagulation Profile: No results for input(s): "INR", "PROTIME" in the last 168 hours. Cardiac Enzymes: No results for input(s): "CKTOTAL", "CKMB", "CKMBINDEX", "TROPONINI" in the last 168 hours. BNP (last 3 results) No results for input(s): "PROBNP" in the last 8760 hours. HbA1C: No results for input(s): "HGBA1C" in the last 72 hours. CBG: Recent Labs  Lab 04/09/23 2112 04/10/23 0808 04/10/23 1139 04/10/23 2152 04/11/23 0736  GLUCAP 109* 80 110* 130* 138*   Lipid Profile: No results for input(s): "CHOL", "HDL", "LDLCALC", "TRIG", "CHOLHDL", "LDLDIRECT" in the last 72 hours. Thyroid Function Tests: No results for input(s): "TSH", "T4TOTAL", "FREET4", "T3FREE", "THYROIDAB" in the last 72 hours. Anemia Panel: No results for input(s): "VITAMINB12", "FOLATE", "FERRITIN", "TIBC", "IRON", "RETICCTPCT" in the last  72 hours. Sepsis Labs: Recent Labs  Lab 04/08/23 0313 04/08/23 0530 04/09/23 0335 04/10/23 0233  PROCALCITON  --   --  1.19 1.01  LATICACIDVEN 2.4* 1.8  --   --     Recent Results (from the past 240 hours)  Resp panel by RT-PCR (RSV, Flu A&B, Covid) Anterior Nasal Swab     Status: None   Collection Time: 04/08/23  4:11 AM   Specimen: Anterior Nasal Swab  Result Value Ref Range Status   SARS Coronavirus 2 by RT PCR NEGATIVE NEGATIVE Final   Influenza A by PCR NEGATIVE NEGATIVE Final   Influenza B by PCR NEGATIVE NEGATIVE Final    Comment: (NOTE) The Xpert Xpress SARS-CoV-2/FLU/RSV plus assay is intended as an aid in the diagnosis of influenza from Nasopharyngeal swab specimens and should not be used as a sole basis for treatment. Nasal washings and aspirates are unacceptable for Xpert Xpress SARS-CoV-2/FLU/RSV testing.  Fact Sheet for Patients: BloggerCourse.com  Fact Sheet for Healthcare Providers: SeriousBroker.it  This test is not yet approved or cleared by the [place] FDA and has been authorized for detection and/or diagnosis of SARS-CoV-2 by FDA under an Emergency Use Authorization (EUA). This EUA will remain in effect (meaning this test can be used) for the duration of the COVID-19 declaration under Section 564(b)(1) of the Act, 21 U.S.C. section 360bbb-3(b)(1), unless the authorization is terminated or revoked.     Resp Syncytial Virus by PCR NEGATIVE NEGATIVE Final    Comment: (NOTE) Fact Sheet for Patients: BloggerCourse.com  Fact Sheet for Healthcare Providers: SeriousBroker.it  This test is not yet approved or cleared by the [place] FDA and has been authorized for detection and/or diagnosis of SARS-CoV-2 by FDA under an Emergency Use Authorization (EUA). This EUA will remain in effect (meaning this test can be used) for the duration of  the COVID-19 declaration under Section 564(b)(1) of the Act, 21 U.S.C. section 360bbb-3(b)(1), unless the authorization is terminated or revoked.  Performed at [facility], [address] [place], [place] [address]   Culture, blood (routine x 2)     Status: None (Preliminary result)   Collection Time: 04/08/23  6:22 AM   Specimen: BLOOD RIGHT HAND  Result Value Ref Range Status   Specimen Description BLOOD RIGHT HAND  Final   Special Requests   Final    BOTTLES DRAWN AEROBIC AND ANAEROBIC Blood Culture results may not be optimal due to an inadequate volume of blood received in culture bottles   Culture   Final    NO GROWTH 3 DAYS Performed at [facility], [address] [place], [place] [address]    Report Status PENDING  Incomplete  Respiratory (~20 pathogens) panel by PCR     Status: Abnormal   Collection Time: 04/08/23 10:36 AM   Specimen: Nasopharyngeal Swab; Respiratory  Result Value Ref Range Status   Adenovirus NOT DETECTED NOT DETECTED Final   Coronavirus 229E NOT DETECTED NOT DETECTED Final    Comment: (NOTE) The Coronavirus on the Respiratory Panel, DOES NOT test for the novel  Coronavirus (2019 nCoV)    Coronavirus HKU1 NOT DETECTED NOT DETECTED Final   Coronavirus NL63 NOT DETECTED NOT DETECTED Final   Coronavirus OC43 DETECTED (A) NOT DETECTED Final   Metapneumovirus NOT DETECTED NOT DETECTED Final   Rhinovirus / Enterovirus NOT DETECTED NOT DETECTED Final   Influenza A NOT DETECTED NOT DETECTED Final   Influenza B NOT DETECTED NOT DETECTED Final   Parainfluenza Virus 1 NOT DETECTED NOT DETECTED Final   Parainfluenza Virus 2 NOT DETECTED NOT DETECTED Final   Parainfluenza Virus 3 NOT DETECTED NOT DETECTED Final   Parainfluenza Virus 4 NOT DETECTED NOT DETECTED Final   Respiratory Syncytial Virus NOT DETECTED NOT DETECTED Final   Bordetella pertussis NOT DETECTED NOT DETECTED Final   Bordetella Parapertussis NOT DETECTED NOT DETECTED Final    Chlamydophila pneumoniae NOT DETECTED NOT DETECTED Final   Mycoplasma pneumoniae NOT DETECTED NOT DETECTED Final    Comment: Performed at [facility], [address], [place], [place] [address]  MRSA Next Gen by PCR, Nasal     Status: None   Collection Time: 04/08/23  1:09 PM   Specimen: Nasal Mucosa; Nasal Swab  Result Value Ref Range Status   MRSA by PCR Next Gen NOT DETECTED NOT DETECTED Final    Comment: (NOTE) The GeneXpert MRSA Assay (FDA approved for NASAL  specimens only), is one component of a comprehensive MRSA colonization surveillance program. It is not intended to diagnose MRSA infection nor to guide or monitor treatment for MRSA infections. Test performance is not FDA approved in patients less than 25 years old. Performed at [facility], [address] [place], [place] [address]   Culture, blood (routine x 2)     Status: None (Preliminary result)   Collection Time: 04/08/23  1:26 PM   Specimen: BLOOD RIGHT HAND  Result Value Ref Range Status   Specimen Description BLOOD RIGHT HAND  Final   Special Requests   Final    BOTTLES DRAWN AEROBIC AND ANAEROBIC Blood Culture results may not be optimal due to an inadequate volume of blood received in culture bottles   Culture   Final    NO GROWTH 3 DAYS Performed at [facility], [address] [place], [place] [address]    Report Status PENDING  Incomplete      Radiology Studies: No results found.   Scheduled Meds:  Chlorhexidine Gluconate Cloth  6 each Topical Daily   insulin aspart  0-6 Units Subcutaneous TID WC   leptospermum manuka honey  1 Application Topical Daily   liver oil-zinc oxide   Topical TID   pantoprazole  40 mg Oral Daily   polyethylene glycol  17 g Oral Daily   sodium chloride flush  10-40 mL Intracatheter Q12H   zolpidem  5 mg Oral QHS   Continuous Infusions:  piperacillin-tazobactam (ZOSYN)  IV 3.375 g (04/11/23 0459)     LOS: 3 days   Joycelyn Das, MD [facility] Available via Epic secure chat 7am-7pm After these hours, please refer to coverage provider listed on [email or web address] 04/11/2023, 11:19 AM

## 2023-04-11 NOTE — Care Management Important Message (Signed)
 Important Message  Patient Details  Name: Amanda Crawford MRN: [id number] Date of Birth: Jan 17, 1946   Important Message Given:  Yes - Medicare IM     Dorena Bodo 04/11/2023, 1:48 PM

## 2023-04-12 ENCOUNTER — Inpatient Hospital Stay: Payer: No Typology Code available for payment source

## 2023-04-12 ENCOUNTER — Telehealth: Payer: Self-pay | Admitting: *Deleted

## 2023-04-12 DIAGNOSIS — R579 Shock, unspecified: Secondary | ICD-10-CM | POA: Diagnosis not present

## 2023-04-12 LAB — CBC WITH DIFFERENTIAL/PLATELET
Abs Immature Granulocytes: 1.1 10*3/uL — ABNORMAL HIGH (ref 0.00–0.07)
Basophils Absolute: 0 10*3/uL (ref 0.0–0.1)
Basophils Relative: 0 %
Eosinophils Absolute: 0 10*3/uL (ref 0.0–0.5)
Eosinophils Relative: 0 %
HCT: 27.3 % — ABNORMAL LOW (ref 36.0–46.0)
Hemoglobin: 8.9 g/dL — ABNORMAL LOW (ref 12.0–15.0)
Lymphocytes Relative: 8 %
Lymphs Abs: 1.8 10*3/uL (ref 0.7–4.0)
MCH: 32.6 pg (ref 26.0–34.0)
MCHC: 32.6 g/dL (ref 30.0–36.0)
MCV: 100 fL (ref 80.0–100.0)
Metamyelocytes Relative: 3 %
Monocytes Absolute: 0 10*3/uL — ABNORMAL LOW (ref 0.1–1.0)
Monocytes Relative: 0 %
Myelocytes: 2 %
Neutro Abs: 19.7 10*3/uL — ABNORMAL HIGH (ref 1.7–7.7)
Neutrophils Relative %: 87 %
Platelets: 115 10*3/uL — ABNORMAL LOW (ref 150–400)
RBC: 2.73 MIL/uL — ABNORMAL LOW (ref 3.87–5.11)
RDW: 14.7 % (ref 11.5–15.5)
WBC: 22.7 10*3/uL — ABNORMAL HIGH (ref 4.0–10.5)
nRBC: 0 /100{WBCs}
nRBC: 0.2 % (ref 0.0–0.2)

## 2023-04-12 LAB — COMPREHENSIVE METABOLIC PANEL
ALT: 42 U/L (ref 0–44)
AST: 32 U/L (ref 15–41)
Albumin: 2.2 g/dL — ABNORMAL LOW (ref 3.5–5.0)
Alkaline Phosphatase: 145 U/L — ABNORMAL HIGH (ref 38–126)
Anion gap: 9 (ref 5–15)
BUN: 20 mg/dL (ref 8–23)
CO2: 18 mmol/L — ABNORMAL LOW (ref 22–32)
Calcium: 9.8 mg/dL (ref 8.9–10.3)
Chloride: 111 mmol/L (ref 98–111)
Creatinine, Ser: 1.33 mg/dL — ABNORMAL HIGH (ref 0.44–1.00)
GFR, Estimated: 41 mL/min — ABNORMAL LOW (ref >60)
Glucose, Bld: 105 mg/dL — ABNORMAL HIGH (ref 70–99)
Potassium: 4.2 mmol/L (ref 3.5–5.1)
Sodium: 138 mmol/L (ref 135–145)
Total Bilirubin: 0.4 mg/dL (ref 0.0–1.2)
Total Protein: 5 g/dL — ABNORMAL LOW (ref 6.5–8.1)

## 2023-04-12 LAB — GLUCOSE, CAPILLARY
Glucose-Capillary: 102 mg/dL — ABNORMAL HIGH (ref 70–99)
Glucose-Capillary: 138 mg/dL — ABNORMAL HIGH (ref 70–99)

## 2023-04-12 LAB — MAGNESIUM: Magnesium: 1.7 mg/dL (ref 1.7–2.4)

## 2023-04-12 LAB — LIPASE, BLOOD: Lipase: 63 U/L — ABNORMAL HIGH (ref 11–51)

## 2023-04-12 LAB — PHOSPHORUS: Phosphorus: 2.9 mg/dL (ref 2.5–4.6)

## 2023-04-12 MED ORDER — HEPARIN SOD (PORK) LOCK FLUSH 100 UNIT/ML IV SOLN
500.0000 [IU] | INTRAVENOUS | Status: AC | PRN
  Administered 2023-04-12: 500 [IU]
  Filled 2023-04-12: qty 5

## 2023-04-12 MED ORDER — AMOXICILLIN-POT CLAVULANATE 875-125 MG PO TABS: 1.0000 | ORAL_TABLET | Freq: Two times a day (BID) | ORAL | 0 refills | Status: AC

## 2023-04-12 MED ORDER — RISAQUAD PO CAPS: 1.0000 | ORAL_CAPSULE | Freq: Every day | ORAL | 0 refills | Status: AC

## 2023-04-12 MED ORDER — PANTOPRAZOLE SODIUM 40 MG PO TBEC: 40.0000 mg | DELAYED_RELEASE_TABLET | Freq: Every day | ORAL | 0 refills | Status: DC

## 2023-04-12 MED ORDER — BIOTENE DRY MOUTH MT LIQD
15.0000 mL | OROMUCOSAL | Status: DC
  Administered 2023-04-12: 15 mL via OROMUCOSAL

## 2023-04-12 NOTE — Telephone Encounter (Signed)
 Copied from CRM [id number]. Topic: Clinical - Home Health Verbal Orders >> Apr 11, 2023  4:26 PM Sim Boast F wrote: Caller/Agency: Alinda Money with [facility]  Callback Number: [phone]  Service Requested: Physical Therapy Frequency: Wants to know if Dr. Abner Greenspan will sign home health orders for patient Any new concerns about the patient? No

## 2023-04-12 NOTE — TOC Transition Note (Addendum)
 Transition of Care [facility]) - Discharge Note   Patient Details  Name: Amanda Crawford MRN: [id number] Date of Birth: 09-01-1945  [facility] CM/SW Contact:  Gordy Clement, RN Phone Number: 04/12/2023, 10:26 AM   Clinical Narrative:     Update: 11:24 AM  PT has performed an ambulation study for O2 needs Patient maintained 92% on Room Air- No O2 needed RNCM has notified patient    Patient will DC to home today. [facility] will provide PT AVS updated  Family will transport       Barriers to Discharge: Continued Medical Work up   Patient Goals and CMS Choice Patient states their goals for this hospitalization and ongoing recovery are:: patient wants to return home onces stable.   Choice offered to / list presented to : NA      Discharge Placement                       Discharge Plan and Services Additional resources added to the After Visit Summary for     Discharge Planning Services: CM Consult                                 Social Drivers of Health (SDOH) Interventions SDOH Screenings   Food Insecurity: No Food Insecurity (04/09/2023)  Housing: Low Risk  (04/09/2023)  Transportation Needs: No Transportation Needs (04/09/2023)  Utilities: Not At Risk (04/09/2023)  Alcohol Screen: Low Risk  (07/05/2020)  Depression (PHQ2-9): Low Risk  (12/04/2022)  Financial Resource Strain: Low Risk  (07/05/2020)  Physical Activity: Inactive (07/05/2020)  Social Connections: Socially Integrated (04/09/2023)  Stress: No Stress Concern Present (08/12/2022)   Received from [facility]  Tobacco Use: Medium Risk (04/08/2023)     Readmission Risk Interventions    04/09/2023   12:01 PM  Readmission Risk Prevention Plan  Transportation Screening Complete  HRI or Home Care Consult Complete  Social Work Consult for Recovery Care Planning/Counseling Complete  Palliative Care Screening Not Applicable  Medication Review Oceanographer) Referral to Pharmacy

## 2023-04-12 NOTE — Progress Notes (Signed)
Patient in yellow mews at start of shift

## 2023-04-12 NOTE — Progress Notes (Signed)
 Ms. Matusik does look better.  She feels better.  She still has problems with diarrhea.  She did sit in the chair for quite a while yesterday.  Her blood counts show was a count of 22.7.  Hemoglobin 8.9.  Blood count 115,000.  Sodium 138.  Potassium 4.2.  BUN 20 creatinine 1.33.  Calcium 9.8 with an albumin of 2.2.  She is not complain of any fever.  She has a little bit of a sore throat.  Her mouth is quite dry.  Maybe some Biotene will help.  I think she is still on some clear liquids.  Maybe, her diet can be advanced a little bit today.  She has had no bleeding.  Temperature 98.1.  Pulse 109.  Blood pressure 123/59.  Oral exam does show dry oral mucosa.  There is no obvious buccal status or thrush.  Lungs sound relatively clear bilaterally.  She has good air movement bilaterally.  I hear no wheezing.  Cardiac exam tachycardic but regular.  Abdomen is obese but soft.  Bowel sounds are present.  There is no guarding or rebound tenderness.  There is no fluid wave.  There is no palpable liver or spleen tip.  Extremity shows no clubbing, cyanosis or edema.  Again it looks like she may have some diverticulitis.  Is hard to say if there is any pancreatitis.  Her lipase is only 63.  I am glad that she was able to sit up in a chair for most of the day.  She enjoyed this.  She is being seen by mobility specialist.  Hopefully, she will be able to improve her dietary intake.  We will have to watch her blood counts.  I am not surprised that her white cell count is going down now.  I know that she will get incredible care from the fantastic staff up on [address]  Christin Bach, MD  Ivin Booty 1:9

## 2023-04-12 NOTE — Progress Notes (Signed)
 Physical Therapy Treatment Patient Details Name: Amanda Crawford MRN: [id number] DOB: 1945-07-30 Today's Date: 04/12/2023   History of Present Illness Amanda Crawford is a 78 y.o. female who presents to the ED 04/08/23 for AMS, GI upset and slurred speech. Workup in ER notable for very low blood glucose, shock state, and abnormal CT C/A/P: query pancreatitis, query colitis. PMHx: type 2 diabetes, lung cancer on chemotherapy (small cell carcinoma), renal cell carcinoma and left bundle branch block; recently treated for PNA and UTI    PT Comments  Tolerated treatment well, eager and motivated to mobilize. Education on frequent pressure relief strategies and guidelines, verbalized understanding. Urinary incontinence upon standing, but able to further void and perform self peri-care in restroom. Ambulated 80 feet with RW for support, single standing rest break. SpO2 92% on room air, moderate dyspnea, HR to 130. Recovers quickly upon sitting. Patient will continue to benefit from skilled physical therapy services to further improve independence with functional mobility.     If plan is discharge home, recommend the following: A little help with walking and/or transfers;A little help with bathing/dressing/bathroom;Assist for transportation;Assistance with cooking/housework   Can travel by private vehicle        Equipment Recommendations  BSC/3in1    Recommendations for Other Services       Precautions / Restrictions Precautions Precautions: Fall Recall of Precautions/Restrictions: Intact Precaution/Restrictions Comments: Incontinent. monitor O2. Restrictions Weight Bearing Restrictions Per Provider Order: No     Mobility  Bed Mobility Overal bed mobility: Modified Independent             General bed mobility comments: increased time needed, no physical assist    Transfers Overall transfer level: Needs assistance Equipment used: Rolling walker (2 wheels) Transfers: Sit to/from  Stand Sit to Stand: Supervision           General transfer comment: Supervision for safety, performed from bed and toilet. Slow effortful rise but stable with bil UE support.    Ambulation/Gait Ambulation/Gait assistance: Supervision Gait Distance (Feet): 80 Feet Assistive device: Rolling walker (2 wheels) Gait Pattern/deviations: Step-through pattern, Decreased step length - right, Decreased step length - left, Decreased stride length Gait velocity: decr Gait velocity interpretation: <1.31 ft/sec, indicative of household ambulator   General Gait Details: Moderate dyspnea, with SpO2 92% on room air. HR increased to 130. Educted on energy conservation technique, rollator may be beneficial as well which she states she has at home. No overt LOB. Required 1 standing rest break to complete distance. No overt LOB. She does feel much weaker than baseline.   Stairs             Wheelchair Mobility     Tilt Bed    Modified Rankin (Stroke Patients Only)       Balance Overall balance assessment: Mild deficits observed, not formally tested                                          Communication Communication Communication: No apparent difficulties  Cognition Arousal: Alert Behavior During Therapy: WFL for tasks assessed/performed   PT - Cognitive impairments: No apparent impairments                         Following commands: Intact      Cueing Cueing Techniques: Verbal cues  Exercises      General  Comments General comments (skin integrity, edema, etc.): Urinary incontinence upon standing. Able to void further in rest-room. Performed self care/hygiene without assist.      Pertinent Vitals/Pain Pain Assessment Pain Assessment: No/denies pain    Home Living                          Prior Function            PT Goals (current goals can now be found in the care plan section) Acute Rehab PT Goals Patient Stated Goal:  return home PT Goal Formulation: With patient/family Time For Goal Achievement: 04/17/23 Potential to Achieve Goals: Good Progress towards PT goals: Progressing toward goals    Frequency    Min 1X/week      PT Plan      Co-evaluation              AM-PAC PT "6 Clicks" Mobility   Outcome Measure  Help needed turning from your back to your side while in a flat bed without using bedrails?: None Help needed moving from lying on your back to sitting on the side of a flat bed without using bedrails?: None Help needed moving to and from a bed to a chair (including a wheelchair)?: A Little Help needed standing up from a chair using your arms (e.g., wheelchair or bedside chair)?: A Little Help needed to walk in hospital room?: A Little Help needed climbing 3-5 steps with a railing? : A Little 6 Click Score: 20    End of Session Equipment Utilized During Treatment: Gait belt Activity Tolerance: Patient tolerated treatment well Patient left: with call bell/phone within reach;in bed;with bed alarm set (pillow for 1/4 roll)   PT Visit Diagnosis: Muscle weakness (generalized) (M62.81);Other abnormalities of gait and mobility (R26.89)     Time: 2440-1027 PT Time Calculation (min) (ACUTE ONLY): 36 min  Charges:    $Gait Training: 8-22 mins $Therapeutic Activity: 8-22 mins PT General Charges $$ ACUTE PT VISIT: 1 Visit                     Kathlyn Sacramento, PT, DPT [facility]  Rehabilitation Services Physical Therapist Office: [phone] Website: [email or web address]    Berton Mount 04/12/2023, 11:32 AM

## 2023-04-12 NOTE — Telephone Encounter (Signed)
 Called and spoke with Alinda Money from home health. She said patient is still in the hospital. She will be there for the next 2 days. She is going to fax home health orders for provider to sign

## 2023-04-12 NOTE — Plan of Care (Signed)
 progressing

## 2023-04-12 NOTE — Discharge Summary (Signed)
 Physician Discharge Summary  Amanda Crawford WNU:[id number] DOB: 06/23/1945 DOA: 04/08/2023  PCP: [name], MD  Admit date: 04/08/2023 Discharge date: 04/12/2023  Admitted From: Home  Discharge disposition: Home with home health  Recommendations for Outpatient Follow-Up:   Follow up with your primary care provider in one week.  Check CBC, BMP, magnesium in the next visit Follow-up with oncology as scheduled by you.  Discharge Diagnosis:   Principal Problem:   Shock [facility])   Discharge Condition: Improved.  Diet recommendation:   Carbohydrate-modified  Wound care: None.  Code status: Full.   History of Present Illness:   78 year old female with past medical history of  small cell lung cancer recently started on chemo, prior renal cell cancer with isolated mets s/p left lung wedge resection and nephrectomy, CKD, IDDM, chronic GI issues presented to hospital with confusion and diarrhea.  She has had diarrhea in the past as well.  Patient was admitted hospital for recurrent diverticulitis.  Initially, patient had low blood glucose level and was hypotensive.  CT scan of the abdomen was notable for diverticulitis with query pancreatitis.  Patient was initially admitted to the ICU and was subsequently transferred to medical service.   Hospital Course:   Following conditions were addressed during hospitalization as listed below,  Hypovolemic and septic shock source secondary to duodenitis and diverticulitis  Coronavirus variant URI Patient was initially in the ICU on vasopressors.  Received IV Zosyn during hospitalization and will be changed to Augmentin on discharge. Overall improving.  Tolerating oral diet.    Leuckocytosis Likely secondary to recent filgrastim.  Was followed by hematology during hospitalization.  Diabetes mellitus type 2.   Hypoglycemia   Latest POC glucose of 102.  Patient is on glimepiride and Semglee at home.     Hyponatremia Resolved.    Coronavirus variant Not hypoxic.  No infiltrate.  Encouraged incentive spirometry.   SCLC with mets to hip  -Management per oncology, she is following with Dr. Myna Hidalgo, she is currently on chemotherapy/immunotherapy   Cirrhosis She is not aware of this diagnosis: Cirrhosis with paraesophageal and gastric varices seen on CT scan.  Will need outpatient follow-up.   Thrombocytopenia -Due to recent chemotherapy, latest platelet count of 115.  No evidence of bleeding.   CKD stage IIIb -baseline creatinine around 1.4.  Creatinine today at 1.2.   Hypophosphatemia Replenished and improved.  Latest phosphorus of 3.6.   HTN Patient is on Avapro, Coreg at home.  Currently on hold   Generalized weakness Deconditioning PT OT has recommended home health on discharge.  Will continue out of bed to chair and mobilize the patient.   Disposition.  At this time, patient is stable for disposition home with home health.  Plan to follow-up with PCP and oncology as outpatient.  Medical Consultants:   None.  Procedures:    None Subjective:   Today, patient was seen and examined at bedside.  States that she feels good and was able to sleep well.  Has tolerated oral diet.  Complains of mild loose stools.  Discharge Exam:   Vitals:   04/12/23 0700 04/12/23 0753  BP:  131/68  Pulse: (!) 104 (!) 105  Resp: 17 (!) 23  Temp:  98.6 F (37 C)  SpO2: 98% 97%   Vitals:   04/12/23 0421 04/12/23 0500 04/12/23 0700 04/12/23 0753  BP: (!) 110/51   131/68  Pulse: (!) 107 (!) 112 (!) 104 (!) 105  Resp: 20 20 17  (!)  23  Temp:    98.6 F (37 C)  TempSrc:      SpO2: 93% 97% 98% 97%  Weight: 104.3 kg     Height:       Body mass index is 39.47 kg/m.   General: Alert awake, not in obvious distress, obese built HENT: pupils equally reacting to light,  No scleral pallor or icterus noted. Oral mucosa is moist.  Chest:  Clear breath sounds.  No crackles or wheezes.  CVS: S1 &S2 heard. No murmur.   Regular rate and rhythm. Abdomen: Soft, nontender, nondistended.  Bowel sounds are heard.  Mild left lower quadrant tenderness on palpation. Extremities: No cyanosis, clubbing or edema.  Peripheral pulses are palpable. Psych: Alert, awake and oriented, normal mood CNS:  No cranial nerve deficits.  Power equal in all extremities.   Skin: Warm and dry.  No rashes noted.  The results of significant diagnostics from this hospitalization (including imaging, microbiology, ancillary and laboratory) are listed below for reference.     Diagnostic Studies:   CT CHEST ABDOMEN PELVIS W CONTRAST Result Date: 04/08/2023 CLINICAL DATA:  Cold and flu like symptoms. Weakness, chills, sore throat, diarrhea, nausea, fatigue. Personal history of lung cancer. * Tracking Code: BO * EXAM: CT CHEST, ABDOMEN, AND PELVIS WITH CONTRAST TECHNIQUE: Multidetector CT imaging of the chest, abdomen and pelvis was performed following the standard protocol during bolus administration of intravenous contrast. RADIATION DOSE REDUCTION: This exam was performed according to the departmental dose-optimization program which includes automated exposure control, adjustment of the mA and/or kV according to patient size and/or use of iterative reconstruction technique. CONTRAST:  60mL OMNIPAQUE IOHEXOL 350 MG/ML SOLN COMPARISON:  PET-CT 03/16/2023 FINDINGS: CT CHEST FINDINGS Cardiovascular: The heart size is normal. No substantial pericardial effusion. Coronary artery calcification is evident. Mild atherosclerotic calcification is noted in the wall of the thoracic aorta. Right Port-A-Cath tip is positioned at the SVC/RA junction. Mediastinum/Nodes: Right paratracheal node measured previously at 11 mm short axis is 7 mm short axis today on 14/3. Subcarinal lymph node measured previously at 2 cm short axis is 1.4 cm short axis today. No evidence for gross hilar lymphadenopathy although assessment is limited by the lack of intravenous contrast on the  current study. There is no axillary lymphadenopathy. The esophagus has normal imaging features. Paraesophageal varices evident. Lungs/Pleura: Right lower lobe mass measured previously at 3.4 cm is 2.7 cm today on image 80/4. Atelectasis or scarring is noted in the right lower lung. No new suspicious pulmonary nodule or mass. Musculoskeletal: No worrisome lytic or sclerotic osseous abnormality. Nonacute right rib fracture evident. CT ABDOMEN PELVIS FINDINGS Hepatobiliary: Nodular liver contour is compatible with cirrhosis. Cholecystectomy. No intrahepatic or extrahepatic biliary dilation. Pancreas: Pancreatic parenchyma is ill-defined with edema/inflammation seen around the pancreatic head and proximal transverse duodenum (image 68-78 of series 3) in subtle peripancreatic edema in the region of the pancreatic tail (axial 63/3). No main duct dilatation. Spleen: No splenomegaly. No suspicious focal mass lesion. Adrenals/Urinary Tract: No adrenal nodule or mass. Focal scarring noted lower pole right kidney. Left kidney surgically absent. Right ureter unremarkable. The urinary bladder appears normal for the degree of distention. Stomach/Bowel: Gastric varices noted in the region of the cardia. Wall of the descending and proximal transverse duodenum is ill-defined with adjacent edema. Small bowel is otherwise unremarkable. The terminal ileum is normal. The appendix is not well visualized, but there is no edema or inflammation in the region of the cecal tip to suggest appendicitis. Diverticular  changes seen in the left colon and there is subtle pericolonic edema/inflammation (axial 99/3) with some thickening of the transverse mesocolon. Vascular/Lymphatic: There is advanced atherosclerotic calcification of the abdominal aorta without aneurysm. There is no gastrohepatic or hepatoduodenal ligament lymphadenopathy. No retroperitoneal or mesenteric lymphadenopathy. No pelvic sidewall lymphadenopathy. Reproductive:  Hysterectomy.  There is no adnexal mass. Other: No intraperitoneal free fluid. Musculoskeletal: No worrisome lytic or sclerotic osseous abnormality. Similar appearance of previously described 2.8 cm posterior left iliac bone lesion (image 92/3). IMPRESSION: 1. Edema/inflammation around the pancreatic head and proximal transverse duodenum with subtle peripancreatic edema in the region of the pancreatic tail. Imaging features are compatible with acute pancreatitis given changes in the pancreatic tail region. Ill definition of the descending and proximal transverse duodenum is likely secondary although duodenitis could have this appearance. Correlation with serum lipase recommended. 2. Left colonic diverticulosis with subtle pericolonic edema/inflammation and some thickening of the transverse mesocolon. Imaging features compatible with acute diverticulitis. 3. Right lower lobe mass measured previously at 3.4 cm is 2.7 cm today. 4. Mediastinal lymphadenopathy has improved in the interval. 5. Cirrhosis with paraesophageal and gastric varices. 6. Similar appearance of previously described 2.8 cm posterior left iliac bone lesion noted to be hypermetabolic on PET imaging is suspicious for metastatic involvement. Electronically Signed   By: Kennith Center M.D.   On: 04/08/2023 09:17   CT Head Wo Contrast Result Date: 04/08/2023 CLINICAL DATA:  Neuro deficit, acute, stroke suspected EXAM: CT HEAD WITHOUT CONTRAST TECHNIQUE: Contiguous axial images were obtained from the base of the skull through the vertex without intravenous contrast. RADIATION DOSE REDUCTION: This exam was performed according to the departmental dose-optimization program which includes automated exposure control, adjustment of the mA and/or kV according to patient size and/or use of iterative reconstruction technique. COMPARISON:  MRI head March 10, 2023. FINDINGS: Brain: Small probable meningioma better evaluated on recent MRI head. No evidence of acute  large vascular territory infarct, acute hemorrhage, mass lesion or midline shift. Patchy white matter hypodensities are compatible with chronic microvascular ischemic change. Vascular: No hyperdense vessel. Skull: No acute fracture. Sinuses/Orbits: Mild paranasal sinus mucosal thickening. No acute orbital findings. Other: No mastoid effusions. IMPRESSION: No evidence of acute intracranial abnormality. Electronically Signed   By: Feliberto Harts M.D.   On: 04/08/2023 04:10   DG Chest Portable 1 View Result Date: 04/08/2023 CLINICAL DATA:  Hypoglycemia EXAM: PORTABLE CHEST 1 VIEW COMPARISON:  03/02/2023 FINDINGS: Lungs are symmetrically expanded. Linear atelectasis at the right lung base. No pneumothorax or pleural effusion. Interval right internal jugular chest port placement with its tip within the superior vena cava. A a cardiac size within normal limits. Pulmonary vascularity is normal. Osseous structures are age-appropriate. No acute bone abnormality. IMPRESSION: 1. Right basilar atelectasis. Electronically Signed   By: Helyn Numbers M.D.   On: 04/08/2023 01:51     Labs:   Basic Metabolic Panel: Recent Labs  Lab 04/08/23 0305 04/09/23 0335 04/10/23 0233 04/11/23 0242 04/12/23 0333  NA 131* 132* 138 139 138  K 3.2* 4.3 4.1 3.9 4.2  CL 100 111 112* 112* 111  CO2 18* 14* 18* 17* 18*  GLUCOSE 152* 177* 101* 107* 105*  BUN 56* 37* 28* 23 20  CREATININE 1.64* 1.40* 1.45* 1.27* 1.33*  CALCIUM 9.3 9.3 9.8 9.8 9.8  MG  --  1.7 2.1 1.9 1.7  PHOS  --  2.4* 2.2* 3.6 2.9   GFR Estimated Creatinine Clearance: 40.3 mL/min (A) (by C-G formula based on  SCr of 1.33 mg/dL (H)). Liver Function Tests: Recent Labs  Lab 04/08/23 0305 04/12/23 0333  AST 47* 32  ALT 47* 42  ALKPHOS 85 145*  BILITOT 0.6 0.4  PROT 5.3* 5.0*  ALBUMIN 2.4* 2.2*   Recent Labs  Lab 04/08/23 0940 04/12/23 0333  LIPASE 62* 63*   No results for input(s): "AMMONIA" in the last 168 hours. Coagulation profile No  results for input(s): "INR", "PROTIME" in the last 168 hours.  CBC: Recent Labs  Lab 04/08/23 0305 04/09/23 0335 04/10/23 0233 04/11/23 0242 04/12/23 0333  WBC 14.2* 26.8* 35.5* 30.7* 22.7*  NEUTROABS 9.6*  --   --   --  19.7*  HGB 10.4* 9.6* 9.4* 9.2* 8.9*  HCT 30.5* 29.7* 28.4* 27.7* 27.3*  MCV 97.8 103.1* 100.0 99.3 100.0  PLT 41* 59* 79* 100* 115*   Cardiac Enzymes: No results for input(s): "CKTOTAL", "CKMB", "CKMBINDEX", "TROPONINI" in the last 168 hours. BNP: Invalid input(s): "POCBNP" CBG: Recent Labs  Lab 04/11/23 1544 04/11/23 1753 04/11/23 2211 04/12/23 0751 04/12/23 1206  GLUCAP 164* 344* 121* 102* 138*   D-Dimer No results for input(s): "DDIMER" in the last 72 hours. Hgb A1c No results for input(s): "HGBA1C" in the last 72 hours. Lipid Profile No results for input(s): "CHOL", "HDL", "LDLCALC", "TRIG", "CHOLHDL", "LDLDIRECT" in the last 72 hours. Thyroid function studies No results for input(s): "TSH", "T4TOTAL", "T3FREE", "THYROIDAB" in the last 72 hours.  Invalid input(s): "FREET3" Anemia work up No results for input(s): "VITAMINB12", "FOLATE", "FERRITIN", "TIBC", "IRON", "RETICCTPCT" in the last 72 hours. Microbiology Recent Results (from the past 240 hours)  Resp panel by RT-PCR (RSV, Flu A&B, Covid) Anterior Nasal Swab     Status: None   Collection Time: 04/08/23  4:11 AM   Specimen: Anterior Nasal Swab  Result Value Ref Range Status   SARS Coronavirus 2 by RT PCR NEGATIVE NEGATIVE Final   Influenza A by PCR NEGATIVE NEGATIVE Final   Influenza B by PCR NEGATIVE NEGATIVE Final    Comment: (NOTE) The Xpert Xpress SARS-CoV-2/FLU/RSV plus assay is intended as an aid in the diagnosis of influenza from Nasopharyngeal swab specimens and should not be used as a sole basis for treatment. Nasal washings and aspirates are unacceptable for Xpert Xpress SARS-CoV-2/FLU/RSV testing.  Fact Sheet for  Patients: BloggerCourse.com  Fact Sheet for Healthcare Providers: SeriousBroker.it  This test is not yet approved or cleared by the [place] FDA and has been authorized for detection and/or diagnosis of SARS-CoV-2 by FDA under an Emergency Use Authorization (EUA). This EUA will remain in effect (meaning this test can be used) for the duration of the COVID-19 declaration under Section 564(b)(1) of the Act, 21 U.S.C. section 360bbb-3(b)(1), unless the authorization is terminated or revoked.     Resp Syncytial Virus by PCR NEGATIVE NEGATIVE Final    Comment: (NOTE) Fact Sheet for Patients: BloggerCourse.com  Fact Sheet for Healthcare Providers: SeriousBroker.it  This test is not yet approved or cleared by the [place] FDA and has been authorized for detection and/or diagnosis of SARS-CoV-2 by FDA under an Emergency Use Authorization (EUA). This EUA will remain in effect (meaning this test can be used) for the duration of the COVID-19 declaration under Section 564(b)(1) of the Act, 21 U.S.C. section 360bbb-3(b)(1), unless the authorization is terminated or revoked.  Performed at [facility], [address] [place], [place] [address]   Culture, blood (routine x 2)     Status: None (Preliminary result)   Collection  Time: 04/08/23  6:22 AM   Specimen: BLOOD RIGHT HAND  Result Value Ref Range Status   Specimen Description BLOOD RIGHT HAND  Final   Special Requests   Final    BOTTLES DRAWN AEROBIC AND ANAEROBIC Blood Culture results may not be optimal due to an inadequate volume of blood received in culture bottles   Culture   Final    NO GROWTH 4 DAYS Performed at [facility], [address] [place], [place] [address]    Report Status PENDING  Incomplete  Respiratory (~20 pathogens) panel by PCR     Status: Abnormal   Collection Time: 04/08/23 10:36 AM    Specimen: Nasopharyngeal Swab; Respiratory  Result Value Ref Range Status   Adenovirus NOT DETECTED NOT DETECTED Final   Coronavirus 229E NOT DETECTED NOT DETECTED Final    Comment: (NOTE) The Coronavirus on the Respiratory Panel, DOES NOT test for the novel  Coronavirus (2019 nCoV)    Coronavirus HKU1 NOT DETECTED NOT DETECTED Final   Coronavirus NL63 NOT DETECTED NOT DETECTED Final   Coronavirus OC43 DETECTED (A) NOT DETECTED Final   Metapneumovirus NOT DETECTED NOT DETECTED Final   Rhinovirus / Enterovirus NOT DETECTED NOT DETECTED Final   Influenza A NOT DETECTED NOT DETECTED Final   Influenza B NOT DETECTED NOT DETECTED Final   Parainfluenza Virus 1 NOT DETECTED NOT DETECTED Final   Parainfluenza Virus 2 NOT DETECTED NOT DETECTED Final   Parainfluenza Virus 3 NOT DETECTED NOT DETECTED Final   Parainfluenza Virus 4 NOT DETECTED NOT DETECTED Final   Respiratory Syncytial Virus NOT DETECTED NOT DETECTED Final   Bordetella pertussis NOT DETECTED NOT DETECTED Final   Bordetella Parapertussis NOT DETECTED NOT DETECTED Final   Chlamydophila pneumoniae NOT DETECTED NOT DETECTED Final   Mycoplasma pneumoniae NOT DETECTED NOT DETECTED Final    Comment: Performed at [facility], [address] [place], [place] [address]  MRSA Next Gen by PCR, Nasal     Status: None   Collection Time: 04/08/23  1:09 PM   Specimen: Nasal Mucosa; Nasal Swab  Result Value Ref Range Status   MRSA by PCR Next Gen NOT DETECTED NOT DETECTED Final    Comment: (NOTE) The GeneXpert MRSA Assay (FDA approved for NASAL specimens only), is one component of a comprehensive MRSA colonization surveillance program. It is not intended to diagnose MRSA infection nor to guide or monitor treatment for MRSA infections. Test performance is not FDA approved in patients less than 62 years old. Performed at [facility], [address], [place], [place] [address]   Culture, blood (routine x 2)     Status: None  (Preliminary result)   Collection Time: 04/08/23  1:26 PM   Specimen: BLOOD RIGHT HAND  Result Value Ref Range Status   Specimen Description BLOOD RIGHT HAND  Final   Special Requests   Final    BOTTLES DRAWN AEROBIC AND ANAEROBIC Blood Culture results may not be optimal due to an inadequate volume of blood received in culture bottles   Culture   Final    NO GROWTH 4 DAYS Performed at [facility], [address] [place], [place] [address]    Report Status PENDING  Incomplete     Discharge Instructions:   Discharge Instructions     Call MD for:  temperature >100.4   Complete by: As directed    Diet Carb Modified   Complete by: As directed    Discharge instructions  Complete by: As directed    Follow-up with your primary care provider in 1 week.  Check blood work at that time.  Complete the course of antibiotic.  Take Imodium if needed for diarrhea.  Follow-up with your oncologist as scheduled by the clinic.   Discharge wound care:   Complete by: As directed    Cleanse coccyx ulcer and intact skin of buttocks with Vashe wound cleanser Hart [place] [phone]), do not rinse and allow to air dry. Apply Medihoney to wound bed of coccyx ulcer daily, cover with dry gauze.  Apply a thin layer of zinc to skin surrounding ulcer and buttocks.  May cover with silicone foam or ABD pad whichever is preferred   Increase activity slowly   Complete by: As directed       Allergies as of 04/12/2023   No Known Allergies      Medication List     PAUSE taking these medications    furosemide 40 MG tablet Wait to take this until: April 14, 2023 Commonly known as: LASIX TAKE 1 TABLET BY MOUTH EVERY DAY       STOP taking these medications    cephALEXin 500 MG capsule Commonly known as: KEFLEX       TAKE these medications    acidophilus Caps capsule Take 1 capsule by mouth daily. Start taking on: April 13, 2023   allopurinol 300 MG tablet Commonly known as: ZYLOPRIM TAKE 1  TABLET BY MOUTH EVERY DAY   amoxicillin-clavulanate 875-125 MG tablet Commonly known as: AUGMENTIN Take 1 tablet by mouth 2 (two) times daily for 3 days.   aspirin EC 81 MG tablet Take 81 mg by mouth daily.   atorvastatin 20 MG tablet Commonly known as: LIPITOR Take 1 tablet (20 mg total) by mouth daily.   BD Pen Needle Nano 2nd Gen 32G X 4 MM Misc Generic drug: Insulin Pen Needle USE AS DIRECTED WITH LANTUS ONCE A DAY.   carvedilol 3.125 MG tablet Commonly known as: COREG Take 0.5 tablets (1.5625 mg total) by mouth 2 (two) times daily with a meal.   dexamethasone 4 MG tablet Commonly known as: DECADRON Take 2 tablets daily for 2 days, on days 4 and 5.Take with food. Every 21 days.   diphenhydramine-acetaminophen 25-500 MG Tabs tablet Commonly known as: TYLENOL PM Take 1 tablet by mouth at bedtime as needed (sleep).   ferrous sulfate 325 (65 FE) MG tablet Take 325 mg by mouth at bedtime.   glimepiride 2 MG tablet Commonly known as: AMARYL TAKE 1 TABLET BY MOUTH EVERY DAY WITH BREAKFAST   hyoscyamine 0.125 MG SL tablet Commonly known as: LEVSIN SL Place 1 tablet (0.125 mg total) under the tongue every 6 (six) hours as needed.   irbesartan 75 MG tablet Commonly known as: AVAPRO Take 1 tablet (75 mg total) by mouth daily.   meclizine 12.5 MG tablet Commonly known as: ANTIVERT Take 1 tablet (12.5 mg total) by mouth 3 (three) times daily as needed for dizziness. As needed for   Dizziness or nausea   nystatin cream Commonly known as: MYCOSTATIN Apply 1 application topically 2 (two) times daily. What changed:  when to take this reasons to take this   ondansetron 8 MG tablet Commonly known as: Zofran Take 1 tablet (8 mg total) by mouth every 8 (eight) hours as needed for nausea or vomiting. Start on third day after chemotherapy.   OneTouch Delica Plus Lancet33G Misc USE AS DIRECTED TO TEST DAILY   OneTouch Verio  test strip Generic drug: glucose blood Check  Blood Sugar 3 times daily.   pantoprazole 40 MG tablet Commonly known as: PROTONIX Take 1 tablet (40 mg total) by mouth daily. Start taking on: April 13, 2023   prochlorperazine 10 MG tablet Commonly known as: COMPAZINE Take 1 tablet (10 mg total) by mouth every 6 (six) hours as needed for nausea or vomiting (Nausea or vomiting).   Semglee (yfgn) 100 UNIT/ML Pen Generic drug: insulin glargine-yfgn Inject 14 Units into the skin at bedtime.   Vitamin D (Ergocalciferol) 1.25 MG (50000 UNIT) Caps capsule Commonly known as: DRISDOL TAKE 1 CAPSULE (50,000 UNITS TOTAL) BY MOUTH EVERY 7 (SEVEN) DAYS.   zolpidem 5 MG tablet Commonly known as: Ambien Take 1 tablet (5 mg total) by mouth at bedtime as needed for sleep. What changed: when to take this               Discharge Care Instructions  (From admission, onward)           Start     Ordered   04/12/23 0000  Discharge wound care:       Comments: Cleanse coccyx ulcer and intact skin of buttocks with Vashe wound cleanser Hart [place] [phone]), do not rinse and allow to air dry. Apply Medihoney to wound bed of coccyx ulcer daily, cover with dry gauze.  Apply a thin layer of zinc to skin surrounding ulcer and buttocks.  May cover with silicone foam or ABD pad whichever is preferred   04/12/23 1015            Follow-up Information     [facility] Follow up.   Why: Iantha Fallen will call you to schedle a home health visit Contact information: [address] [address] [phone]         [name], MD Follow up in 1 week(s).   Specialty: Family Medicine Contact information: [address] [place] [address] [phone]                  Time coordinating discharge: 39 minutes  Signed:  Jewel Venditto  [facility] 04/12/2023, 3:08 PM

## 2023-04-12 NOTE — Progress Notes (Signed)
 Patient and husband both verbalized understanding of dc instructions.all belongings and [facility] given to patient.

## 2023-04-13 ENCOUNTER — Inpatient Hospital Stay: Payer: No Typology Code available for payment source

## 2023-04-13 ENCOUNTER — Telehealth: Payer: Self-pay | Admitting: *Deleted

## 2023-04-13 DIAGNOSIS — G4730 Sleep apnea, unspecified: Secondary | ICD-10-CM | POA: Diagnosis not present

## 2023-04-13 DIAGNOSIS — C342 Malignant neoplasm of middle lobe, bronchus or lung: Secondary | ICD-10-CM | POA: Diagnosis not present

## 2023-04-13 DIAGNOSIS — I959 Hypotension, unspecified: Secondary | ICD-10-CM | POA: Diagnosis not present

## 2023-04-13 DIAGNOSIS — K2980 Duodenitis without bleeding: Secondary | ICD-10-CM | POA: Diagnosis not present

## 2023-04-13 DIAGNOSIS — A419 Sepsis, unspecified organism: Secondary | ICD-10-CM | POA: Diagnosis not present

## 2023-04-13 DIAGNOSIS — C7951 Secondary malignant neoplasm of bone: Secondary | ICD-10-CM | POA: Diagnosis not present

## 2023-04-13 DIAGNOSIS — E11649 Type 2 diabetes mellitus with hypoglycemia without coma: Secondary | ICD-10-CM | POA: Diagnosis not present

## 2023-04-13 DIAGNOSIS — K5792 Diverticulitis of intestine, part unspecified, without perforation or abscess without bleeding: Secondary | ICD-10-CM | POA: Diagnosis not present

## 2023-04-13 DIAGNOSIS — G4700 Insomnia, unspecified: Secondary | ICD-10-CM | POA: Diagnosis not present

## 2023-04-13 DIAGNOSIS — U071 COVID-19: Secondary | ICD-10-CM | POA: Diagnosis not present

## 2023-04-13 LAB — CULTURE, BLOOD (ROUTINE X 2)
Culture: NO GROWTH
Culture: NO GROWTH

## 2023-04-13 NOTE — Telephone Encounter (Signed)
 Call received from patient's daughter, Amanda Crawford to notify Dr. Myna Hidalgo that pt was discharged from the hospital yesterday and she would like to know if pt should proceed with treatment as planned next week.  Dr. Myna Hidalgo notified.  Call placed back to Amanda Crawford and Amanda Crawford notified per order of Dr. Myna Hidalgo that appts for next week will be pushed back by one week.  Amanda Crawford is appreciative of call back and has no further questions at this time.  Message sent to scheduling.

## 2023-04-13 NOTE — Transitions of Care (Post Inpatient/ED Visit) (Signed)
 04/13/2023  Name: Amanda Crawford MRN: [id number] DOB: Apr 15, 1945  Today's TOC FU Call Status: Today's TOC FU Call Status:: Successful TOC FU Call Completed TOC FU Call Complete Date: 04/13/23 Patient's Name and Date of Birth confirmed.  Transition Care Management Follow-up Telephone Call Date of Discharge: 04/12/23 Discharge Facility: [facility]) Type of Discharge: Inpatient Admission Primary Inpatient Discharge Diagnosis:: encephalopathy/ shock/ hypoglycemia/ hypotension How have you been since you were released from the hospital?: Better (per daughter: "She is still very weak; I am handling things right now, normally she is independent.  Thanks for getting this doctor appointment scheduled and I am going to try to go with her to that appointment.  I will call you if any concerns pop up") Any questions or concerns?: Yes Patient Questions/Concerns:: Caregiver reports patient lost her cell phone while hospitalized: states she has called the hospital today to report and is going to the hospital today to follow up Patient Questions/Concerns Addressed: Other: (confirmed daughter/ caregiver has placed calls to report missing cell phone)  Items Reviewed: Did you receive and understand the discharge instructions provided?: Yes (thoroughly reviewed with patient's daughter/ caregiver who verbalizes good understanding of same) Medications obtained,verified, and reconciled?: Partial Review Completed Reason for Partial Mediation Review: Patient's daughter is not currently with patient/ in front of medication list: reports that she is currently assisting with medications and reviewed thoroughly last night with patient and set up weekly pill box for patient: she denies concerns around medications: but had several general questions which were thoroughly addressed during TOC call today Any new allergies since your discharge?: No Dietary orders reviewed?: Yes Type of Diet Ordered:: "Diabetic,  healthy" Do you have support at home?: Yes People in Home: spouse Name of Support/Comfort Primary Source: Reports independent in self-care activities at baseline; spouse assists as/ if needed/ indicated; local son and daughter also assists as indicated  Medications Reviewed Today: Medications Reviewed Today     Reviewed by Michaela Corner, RN (Registered Nurse) on 04/13/23 at 1510  Med List Status: <None>   Medication Order Taking? Sig Documenting Provider Last Dose Status Informant  acidophilus (RISAQUAD) CAPS capsule [id number] Yes Take 1 capsule by mouth daily. Joycelyn Das, MD Taking Active            Med Note Michaela Corner   Fri Apr 13, 2023  2:57 PM) 04/13/23: Reports during TOC call to pick up from outpatient pharmacy today  allopurinol (ZYLOPRIM) 300 MG tablet [id number]  TAKE 1 TABLET BY MOUTH EVERY DAY [name], MD  Active Self, Child  amoxicillin-clavulanate (AUGMENTIN) 875-125 MG tablet [id number] Yes Take 1 tablet by mouth 2 (two) times daily for 3 days. Pokhrel, Rebekah Chesterfield, MD Taking Active   aspirin EC 81 MG tablet [id number]  Take 81 mg by mouth daily. [provider]  Active Self, Child  atorvastatin (LIPITOR) 20 MG tablet [id number]  Take 1 tablet (20 mg total) by mouth daily. [name], MD  Active Self, Child  carvedilol (COREG) 3.125 MG tablet [id number]  Take 0.5 tablets (1.5625 mg total) by mouth 2 (two) times daily with a meal. Leslye Peer, MD  Active Self, Child  dexamethasone (DECADRON) 4 MG tablet [id number]  Take 2 tablets daily for 2 days, on days 4 and 5.Take with food. Every 21 days. Josph Macho, MD  Active Self, Child           Med Note Dorisann Frames Apr 08, 2023 11:30 AM)  Patient is to take after chemotherapy. Should have been taken Feb 27 and 28 but patient thought it was PRN med and did not take. Next chemo starts March 17 so patient should take March 20 and 21.  diphenhydramine-acetaminophen (TYLENOL PM) 25-500 MG TABS [id number]   Take 1 tablet by mouth at bedtime as needed (sleep). [provider]  Active Self, Child           Med Note Rutherford Nail, Lenard Lance Apr 08, 2023 11:17 AM)    ferrous sulfate 325 (65 FE) MG tablet [id number]  Take 325 mg by mouth at bedtime. [provider]  Active Self, Child  furosemide (LASIX) 40 MG tablet [id number] Yes TAKE 1 TABLET BY MOUTH EVERY DAY Hilty, Lisette Abu, MD Taking Active Self, Child           Med Note Michaela Corner   Fri Apr 13, 2023  3:01 PM) 04/13/23: Caregiver reports during [facility] call good understanding to restart on 04/14/23:  advised her to discuss with PCP during HFU office visit on 04/16/23- whether or not to adjust/ modify   glimepiride (AMARYL) 2 MG tablet [id number]  TAKE 1 TABLET BY MOUTH EVERY DAY WITH BREAKFAST [name], MD  Active Self, Child  glucose blood (ONETOUCH VERIO) test strip [id number]  Check Blood Sugar 3 times daily. [name], MD  Active Self, Child    Discontinued 03/25/19 1548            Med Note Moishe Spice   Wed Jan 10, 2023 10:26 AM) Pt is not taking.  hyoscyamine (LEVSIN SL) 0.125 MG SL tablet [id number]  Place 1 tablet (0.125 mg total) under the tongue every 6 (six) hours as needed. [name], MD  Active Self, Child  Insulin Pen Needle (BD PEN NEEDLE NANO 2ND GEN) 32G X 4 MM MISC [id number]  USE AS DIRECTED WITH LANTUS ONCE A DAY. [name], MD  Active Self, Child  irbesartan (AVAPRO) 75 MG tablet [id number]  Take 1 tablet (75 mg total) by mouth daily. [name], MD  Active Self, Child  Lancets [facility] DELICA PLUS [place]) MISC [id number]  USE AS DIRECTED TO TEST DAILY [name], MD  Active Self, Child  meclizine (ANTIVERT) 12.5 MG tablet [id number]  Take 1 tablet (12.5 mg total) by mouth 3 (three) times daily as needed for dizziness. As needed for   Dizziness or nausea [name], MD  Active Self, Child           Med Note Dorisann Frames Apr 08, 2023 11:19 AM)    nystatin cream  (MYCOSTATIN) [id number]  Apply 1 application topically 2 (two) times daily.  Patient taking differently: Apply 1 application  topically 2 (two) times daily as needed (rash).   [name], MD  Active Self, Child           Med Note Dorisann Frames Apr 08, 2023 11:19 AM)    ondansetron (ZOFRAN) 8 MG tablet [id number]  Take 1 tablet (8 mg total) by mouth every 8 (eight) hours as needed for nausea or vomiting. Start on third day after chemotherapy. Josph Macho, MD  Active Self, Child  pantoprazole (PROTONIX) 40 MG tablet [id number] Yes Take 1 tablet (40 mg total) by mouth daily. Pokhrel, Laxman, MD Taking Active   prochlorperazine (COMPAZINE) 10 MG tablet [id number]  Take 1 tablet (10 mg total) by mouth every 6 (six) hours  as needed for nausea or vomiting (Nausea or vomiting). Josph Macho, MD  Active Self, Child  SEMGLEE, YFGN, 100 UNIT/ML Pen [id number] Yes Inject 14 Units into the skin at bedtime. [provider] Taking Active Self, Child  Vitamin D, Ergocalciferol, (DRISDOL) 1.25 MG (50000 UNIT) CAPS capsule [id number]  TAKE 1 CAPSULE (50,000 UNITS TOTAL) BY MOUTH EVERY 7 (SEVEN) DAYS. Eulis Foster, FNP  Active Self, Child           Med Note Dorisann Frames Apr 08, 2023 11:21 AM) Sunday  zolpidem (AMBIEN) 5 MG tablet [id number]  Take 1 tablet (5 mg total) by mouth at bedtime as needed for sleep.  Patient taking differently: Take 5 mg by mouth at bedtime.   Tomma Lightning, MD  Active Self, Child           Home Care and Equipment/Supplies: Were Home Health Services Ordered?: Yes Name of Home Health Agency:: [facility]:  [phone] Has Agency set up a time to come to your home?: Yes First Home Health Visit Date: 04/13/23 Any new equipment or medical supplies ordered?: Yes [facility]) Name of Medical supply agency?: "I can't remember-- but she got the bedside commode" Were you able to get the equipment/medical supplies?: Yes Do you have any questions related to  the use of the equipment/supplies?: No  Functional Questionnaire: Do you need assistance with bathing/showering or dressing?: No (independent at baseline; family assisting as indicated post recent hospital discharge on 04/12/23) Do you need assistance with meal preparation?: No (independent at baseline; family assisting as indicated post recent hospital discharge on 04/12/23) Do you need assistance with eating?: No Do you have difficulty maintaining continence: Yes Aggie Cosier caregiver reports patient has occasional incontinence related to "diverticulitis") Do you need assistance with getting out of bed/getting out of a chair/moving?: No (independent at baseline; family assisting as indicated post recent hospital discharge on 04/12/23) Do you have difficulty managing or taking your medications?: Yes (independent at baseline; family (daughter) assisting as indicated post recent hospital discharge on 04/12/23)  Follow up appointments reviewed: PCP Follow-up appointment confirmed?: Yes (care coordination outreach in real-time with scheduling care guide to successfully schedule hospital follow up PCP appointment 04/16/23) Date of PCP follow-up appointment?: 04/16/23 Follow-up Provider: PCP- Dr. Rogelia Rohrer Specialist [facility] Follow-up appointment confirmed?: Yes Date of Specialist follow-up appointment?: 04/23/23 (verified this is recommended time frame for follow up per hospital discharging provider notes) Follow-Up Specialty Provider:: oncology provider Do you need transportation to your follow-up appointment?: No Do you understand care options if your condition(s) worsen?: Yes-patient verbalized understanding  SDOH Interventions Today    Flowsheet Row Most Recent Value  SDOH Interventions   Food Insecurity Interventions Intervention Not Indicated  Housing Interventions Intervention Not Indicated  Transportation Interventions Intervention Not Indicated  [family provides transportation]  Utilities  Interventions Intervention Not Indicated      Interventions Today    Flowsheet Row Most Recent Value  Chronic Disease   Chronic disease during today's visit Diabetes, Other  [encephalopathy/ hypoglycemia' hypotension/ shock/ URI- COVID]  General Interventions   General Interventions Discussed/Reviewed Durable Medical Equipment (DME), Doctor Visits, General Interventions Discussed  Doctor Visits Discussed/Reviewed Doctor Visits Discussed, PCP, Specialist  Durable Medical Equipment (DME) Bed side commode, Dan Humphreys  [confirmed currently requiring/ using assistive devices for ambulation - walker post-hospitalization,  does not use at baseline]  PCP/Specialist Visits Compliance with follow-up visit  Exercise Interventions   Exercise Discussed/Reviewed Exercise Discussed  [facility] services- confirmed active,  encouraged patient's active participation/ engagement,  role of home health services with importance of participation/ ongoing engagement]  Education Interventions   Education Provided Provided Education  Provided Verbal Education On Medication, When to see the doctor, Other  [daughter has extensive general questions around purpose of medications and new instructions post-hospital discharge- education provided,  process to have nurse added to home health orders, if indicated,  care of sacral wound]  Nutrition Interventions   Nutrition Discussed/Reviewed Nutrition Discussed  Pharmacy Interventions   Pharmacy Dicussed/Reviewed Pharmacy Topics Discussed  Safety Interventions   Safety Discussed/Reviewed Safety Discussed, Fall Risk  [confirmed currently requiring/ using assistive devices for ambulation- post hospital discharge- does not use at baseline]      TOC Interventions Today    Flowsheet Row Most Recent Value  TOC Interventions   TOC Interventions Discussed/Reviewed TOC Interventions Discussed, Arranged PCP follow up within 7 days/Care Guide scheduled, Contacted provider for  patient needs  [Patient declines need for ongoing/ further care management outreach,  declines enrollment in 30-day TOC program,  provided my direct contact information should questions/ concerns/ needs arise post-TOC call,  PCP communication]      Total time spent from review to signing of note/ including any care coordination interventions:  83 minutes  Pls call/ message for questions,  Caryl Pina, RN, BSN, Media planner  Transitions of Care  [facility] [phone]: direct office

## 2023-04-15 DIAGNOSIS — D72829 Elevated white blood cell count, unspecified: Secondary | ICD-10-CM | POA: Insufficient documentation

## 2023-04-15 NOTE — Assessment & Plan Note (Signed)
 Following with oncology and pursing treatments

## 2023-04-15 NOTE — Assessment & Plan Note (Signed)
 No recent exacerbation

## 2023-04-15 NOTE — Assessment & Plan Note (Signed)
Well controlled, no changes to meds. Encouraged heart healthy diet such as the DASH diet and exercise as tolerated. Well controlled, no changes to meds. Encouraged heart healthy diet such as the DASH diet and exercise as tolerated.

## 2023-04-15 NOTE — Assessment & Plan Note (Signed)
 Repeat cbc

## 2023-04-16 ENCOUNTER — Inpatient Hospital Stay: Payer: No Typology Code available for payment source

## 2023-04-16 ENCOUNTER — Inpatient Hospital Stay: Payer: No Typology Code available for payment source | Admitting: Medical Oncology

## 2023-04-16 ENCOUNTER — Ambulatory Visit (INDEPENDENT_AMBULATORY_CARE_PROVIDER_SITE_OTHER): Admitting: Family Medicine

## 2023-04-16 VITALS — BP 96/48 | HR 82 | Temp 97.8°F | Resp 18 | Ht 64.0 in | Wt 213.2 lb

## 2023-04-16 DIAGNOSIS — R197 Diarrhea, unspecified: Secondary | ICD-10-CM | POA: Diagnosis not present

## 2023-04-16 DIAGNOSIS — E119 Type 2 diabetes mellitus without complications: Secondary | ICD-10-CM

## 2023-04-16 DIAGNOSIS — C3490 Malignant neoplasm of unspecified part of unspecified bronchus or lung: Secondary | ICD-10-CM

## 2023-04-16 DIAGNOSIS — I5022 Chronic systolic (congestive) heart failure: Secondary | ICD-10-CM

## 2023-04-16 DIAGNOSIS — I10 Essential (primary) hypertension: Secondary | ICD-10-CM

## 2023-04-16 DIAGNOSIS — C342 Malignant neoplasm of middle lobe, bronchus or lung: Secondary | ICD-10-CM

## 2023-04-16 DIAGNOSIS — R579 Shock, unspecified: Secondary | ICD-10-CM | POA: Diagnosis not present

## 2023-04-16 DIAGNOSIS — D72829 Elevated white blood cell count, unspecified: Secondary | ICD-10-CM

## 2023-04-16 DIAGNOSIS — L98429 Non-pressure chronic ulcer of back with unspecified severity: Secondary | ICD-10-CM | POA: Diagnosis not present

## 2023-04-16 DIAGNOSIS — D721 Eosinophilia, unspecified: Secondary | ICD-10-CM

## 2023-04-16 MED ORDER — HYOSCYAMINE SULFATE 0.125 MG SL SUBL: 0.1250 mg | SUBLINGUAL_TABLET | Freq: Four times a day (QID) | SUBLINGUAL | 1 refills | Status: DC | PRN

## 2023-04-16 NOTE — Patient Instructions (Signed)
 Pressure Injury  A pressure injury, also called a pressure ulcer or bedsore, is an injury to skin and the tissue under the skin that is caused by pressure. It often affects people who must spend a long time in a bed or chair because of a medical condition. Pressure injuries often occur: Over bony parts of the body, such as the tailbone, shoulders, elbows, hips, heels, spine, ankles, and back of the head. Under medical devices that touch the body. These include stockings, equipment to help with breathing, tubes, and splints. Inside the mouth or nose from dentures or tubes. Pressure injuries start as red areas on the skin and can lead to pain and an open wound. What are the causes? This condition is caused by frequent or constant pressure to an area of the body. Less blood flow to the skin can make the tissue die and break down over time, causing a wound. What increases the risk? You are more likely to develop this condition if: You are in the hospital or an extended care facility. You are bedridden or in a wheelchair. You have an injury or disease that keeps you from moving well and feeling pain or pressure. You have a condition that: Makes you sleepy or less alert. Causes poor blood flow. You need to wear a medical device. You have poor control of your bladder or bowel movements (incontinence). You are not getting enough fluid or nutrients (malnutrition). Your health care provider may recommend certain types of mattresses, mattress covers, pillows, cushions, or boots to help prevent a pressure injury. These may include products filled with air, foam, gel, or sand. What are the signs or symptoms? Symptoms of this condition depend on how severe your injury is. Symptoms may include: Red or dark areas of the skin. Pain or a change in skin texture. Your skin may feel warmer, cooler, softer, or firmer. Blisters. An open wound. How is this diagnosed? This condition is diagnosed based on a  medical history and physical exam. You may also have tests, such as: Blood tests. Imaging tests. Blood flow tests. Your injury will be staged based on how severe it is. Staging is based on: How deep the tissue injury is. This includes whether muscle, bone, tendon, or dead tissue is exposed. The cause of the injury. How is this treated? This condition may be treated by: Reducing pressure on your skin. You may need to: Change your position often. Avoid positions that caused the wound or that may make the wound worse. Use certain mattresses, overlays, chair cushions, or protective boots. Move medical devices from an area of pressure, or place padding between the skin and the device. Use foams, creams, or powders to protect your skin from sweat, urine, and stool and reduce rubbing (friction) on the skin. Keeping your skin clean and dry. This may include using a skin cleanser or barrier as told by your health care provider. Cleaning your injury and getting rid of any dead tissue from the wound (debridement). Placing a protective medicine, such as a cream, or bandage (dressing) over your injury. Using medicines for pain or to prevent or treat infection. Surgery may be needed if other treatments are not working or if your injury is very deep. Follow these instructions at home: Medicines Take over-the-counter and prescription medicines only as told by your health care provider. If you were prescribed antibiotics, take or apply them as told by your health care provider. Do not stop using the antibiotic even if you start to  feel better. Eating and drinking Drink enough fluid to keep your urine pale yellow. Eat a healthy diet with lots of protein, as told by your health care provider. Do not use drugs or drink alcohol. Wound care Follow instructions from your health care provider about how to take care of your wound. Make sure you: Wash your hands with soap and water before and after you change  your dressing or apply medicine to your skin. If soap and water are not available, use hand sanitizer. Change your dressing as told by your health care provider. Check your wound every day for signs of infection. Have a caregiver do this for you if you are not able. Check for: Redness, swelling, or more pain. More fluid or blood. Warmth. Pus or a bad smell. Skin care Keep your skin clean and dry. Gently pat your skin dry. Do not rub or massage your skin. Check your skin every day for any changes in color or any new blisters or sores (ulcers). Reducing pressure Do not lie or sit in one position for a long time. Move or change position every 1-2 hours, or as told by your health care provider. Use pillows or cushions to reduce pressure. Ask your health care provider what cushions or pads you should use. General instructions Do not use any products that contain nicotine or tobacco. These products include cigarettes, chewing tobacco, and vaping devices, such as e-cigarettes. If you need help quitting, ask your health care provider. Try to be active every day. Ask your health care provider what exercises or activities are safe for you. Keep all follow-up visits. Your health care provider will check if your injury is healing. Contact a health care provider if: You have a fever or chills. You have pain that does not get better with medicine. Your skin changes color. You have new blisters or sores. You have signs of infection. Your wound does not get better after 1-2 weeks of treatment. This information is not intended to replace advice given to you by your health care provider. Make sure you discuss any questions you have with your health care provider. Document Revised: 07/12/2021 Document Reviewed: 06/17/2021 Elsevier Patient Education  2024 [employer].

## 2023-04-16 NOTE — Progress Notes (Unsigned)
 Subjective:    Patient ID: Amanda Crawford, female    DOB: [birth year], 78 y.o.   MRN: [id number]  Chief Complaint  Patient presents with  . Follow-up    HPI Discussed the use of AI scribe software for clinical note transcription with the patient, who gave verbal consent to proceed.  History of Present Illness     Past Medical History:  Diagnosis Date  . Allergic state 03/12/2015  . Arthritis   . Cirrhosis (HCC)   . Colon polyp 11/19/2014  . Dermatitis 03/12/2015  . Diarrhea 06/29/2016  . Diverticulitis   . DM (diabetes mellitus), type 2 (HCC) 06/29/2016  . GERD (gastroesophageal reflux disease)    occ  . Gout 03/12/2015  . H/O measles   . H/O mumps   . Headache(784.0)    migraines  . History of chicken pox 11/23/2014  . Hyperglycemia 06/29/2016  . Insomnia 02/13/2023  . Lung cancer (HCC)   . Migraine 11/23/2014  . Obesity 11/23/2014  . Pneumonia    child  . Preventative health care 09/12/2015  . Primary hypertension 12/15/2020  . Renal cell cancer (HCC)    renal cell ca dx 9/01 and 8/08;  . Renal insufficiency   . Shortness of breath    occ  . Skin lesion of breast 03/12/2015  . Sleep apnea 01/24/2023   recently dx 01/24/23 - CPAP ordered by not received yet  . Small cell carcinoma of middle lobe of right lung (HCC) 03/14/2023  . Small cell lung cancer, right middle lobe (HCC) 03/14/2023    Past Surgical History:  Procedure Laterality Date  . ABDOMINAL HYSTERECTOMY    . ANAL RECTAL MANOMETRY N/A 06/29/2021   Procedure: ANO RECTAL MANOMETRY;  Surgeon: Shellia Cleverly, DO;  Location: [facility] ENDOSCOPY;  Service: Gastroenterology;  Laterality: N/A;  . APPENDECTOMY    . BRONCHIAL BIOPSY  02/20/2023   Procedure: BRONCHIAL BIOPSIES;  Surgeon: Leslye Peer, MD;  Location: [facility] ENDOSCOPY;  Service: Pulmonary;;  . BRONCHIAL BRUSHINGS  02/20/2023   Procedure: BRONCHIAL BRUSHINGS;  Surgeon: Leslye Peer, MD;  Location: [facility] ENDOSCOPY;  Service: Pulmonary;;  .  BRONCHIAL NEEDLE ASPIRATION BIOPSY  02/20/2023   Procedure: BRONCHIAL NEEDLE ASPIRATION BIOPSIES;  Surgeon: Leslye Peer, MD;  Location: [facility] ENDOSCOPY;  Service: Pulmonary;;  . CARDIAC CATHETERIZATION     yrs ago neg  . CHOLECYSTECTOMY    . COLONOSCOPY  2018  . ESOPHAGOGASTRODUODENOSCOPY     years ago  . FINE NEEDLE ASPIRATION  02/20/2023   Procedure: FINE NEEDLE ASPIRATION (FNA) LINEAR;  Surgeon: Leslye Peer, MD;  Location: [facility] ENDOSCOPY;  Service: Pulmonary;;  . IR IMAGING GUIDED PORT INSERTION  03/21/2023  . IR RADIOLOGIST EVAL & MGMT  07/17/2018  . IR RADIOLOGIST EVAL & MGMT  09/12/2018  . IR RADIOLOGIST EVAL & MGMT  06/18/2019  . IR RADIOLOGIST EVAL & MGMT  01/13/2020  . IR RADIOLOGIST EVAL & MGMT  07/08/2020  . IR RADIOLOGIST EVAL & MGMT  10/12/2020  . KIDNEY SURGERY  2001   Removed of left kidney   . LUNG CANCER SURGERY Left 2008  . RADIOLOGY WITH ANESTHESIA N/A 08/21/2018   Procedure: CT WITH ANESTHESIA RENAL CRYOABLATION;  Surgeon: Oley Balm, MD;  Location: [facility] ORS;  Service: Radiology;  Laterality: N/A;  . RENAL MASS EXCISION Left 2001  . RIGHT/LEFT HEART CATH AND CORONARY ANGIOGRAPHY N/A 07/27/2016   Procedure: Right/Left Heart Cath and Coronary Angiography;  Surgeon: Marykay Lex, MD;  Location: [facility]  INVASIVE CV LAB;  Service: Cardiovascular;  Laterality: N/A;  . THORACOTOMY Left 05/09/2012   Procedure: THORACOTOMY MAJOR;  Surgeon: Alleen Borne, MD;  Location: [facility] OR;  Service: Thoracic;  Laterality: Left;  Marland Kitchen VIDEO BRONCHOSCOPY N/A 05/09/2012   Procedure: VIDEO BRONCHOSCOPY;  Surgeon: Alleen Borne, MD;  Location[facility] OR;  Service: Thoracic;  Laterality: N/A;  . VIDEO BRONCHOSCOPY WITH ENDOBRONCHIAL ULTRASOUND  02/20/2023   Procedure: VIDEO BRONCHOSCOPY WITH ENDOBRONCHIAL ULTRASOUND;  Surgeon: Leslye Peer, MD;  Location: [facility] ENDOSCOPY;  Service: Pulmonary;;  . WEDGE RESECTION Left 05/09/2012   Procedure: LEFT UPPER LOBE WEDGE RESECTION;  Surgeon: Alleen Borne, MD;  Location: [facility] OR;  Service: Thoracic;  Laterality: Left;    Family History  Problem Relation Age of Onset  . Dementia Mother   . Heart failure Father   . Diabetes Father   . Hyperlipidemia Father   . Heart disease Father   . Hypertension Father   . Asthma Brother   . Asthma Daughter   . Alcohol abuse Maternal Aunt   . Colon cancer Paternal Aunt   . Cancer Paternal Grandmother   . Hearing loss Paternal Grandfather   . Stroke Paternal Grandfather     Social History   Socioeconomic History  . Marital status: Married    Spouse name: Not on file  . Number of children: 2  . Years of education: Not on file  . Highest education level: 12th grade  Occupational History  . Occupation: retired  Tobacco Use  . Smoking status: Former    Current packs/day: 0.00    Average packs/day: 0.5 packs/day for 35.0 years (17.5 ttl pk-yrs)    Types: Cigarettes    Start date: 05/08/1971    Quit date: 05/08/2006    Years since quitting: 16.9  . Smokeless tobacco: Never  Vaping Use  . Vaping status: Never Used  Substance and Sexual Activity  . Alcohol use: Yes    Comment: seldomly  . Drug use: No  . Sexual activity: Not Currently    Comment: lives with husband, no dietary restrictions.   Other Topics Concern  . Not on file  Social History Narrative  . Not on file   Social Drivers of Health   Financial Resource Strain: Low Risk  (07/05/2020)   Overall Financial Resource Strain (CARDIA)   . Difficulty of Paying Living Expenses: Not hard at all  Food Insecurity: No Food Insecurity (04/13/2023)   Hunger Vital Sign   . Worried About Programme researcher, broadcasting/film/video in the Last Year: Never true   . Ran Out of Food in the Last Year: Never true  Transportation Needs: No Transportation Needs (04/13/2023)   PRAPARE - Transportation   . Lack of Transportation (Medical): No   . Lack of Transportation (Non-Medical): No  Physical Activity: Inactive (07/05/2020)   Exercise Vital Sign   . Days of Exercise  per Week: 0 days   . Minutes of Exercise per Session: 0 min  Stress: No Stress Concern Present (08/12/2022)   Received from [facility] - Occupational Stress Questionnaire   . Feeling of Stress : Not at all  Social Connections: Socially Integrated (04/09/2023)   Social Connection and Isolation Panel [NHANES]   . Frequency of Communication with Friends and Family: Three times a week   . Frequency of Social Gatherings with Friends and Family: Once a week   . Attends Religious Services: More than 4 times per year   .  Active Member of Clubs or Organizations: Yes   . Attends Banker Meetings: More than 4 times per year   . Marital Status: Married  Catering manager Violence: Patient Unable To Answer (04/13/2023)   Humiliation, Afraid, Rape, and Kick questionnaire   . Fear of Current or Ex-Partner: Patient unable to answer   . Emotionally Abused: Patient unable to answer   . Physically Abused: Patient unable to answer   . Sexually Abused: Patient unable to answer    Outpatient Medications Prior to Visit  Medication Sig Dispense Refill  . acidophilus (RISAQUAD) CAPS capsule Take 1 capsule by mouth daily. 30 capsule 0  . allopurinol (ZYLOPRIM) 300 MG tablet TAKE 1 TABLET BY MOUTH EVERY DAY 90 tablet 1  . aspirin EC 81 MG tablet Take 81 mg by mouth daily.    Marland Kitchen atorvastatin (LIPITOR) 20 MG tablet Take 1 tablet (20 mg total) by mouth daily. 90 tablet 3  . carvedilol (COREG) 3.125 MG tablet Take 0.5 tablets (1.5625 mg total) by mouth 2 (two) times daily with a meal.    . dexamethasone (DECADRON) 4 MG tablet Take 2 tablets daily for 2 days, on days 4 and 5.Take with food. Every 21 days. 30 tablet 1  . diphenhydramine-acetaminophen (TYLENOL PM) 25-500 MG TABS Take 1 tablet by mouth at bedtime as needed (sleep).    . ferrous sulfate 325 (65 FE) MG tablet Take 325 mg by mouth at bedtime.    . furosemide (LASIX) 40 MG tablet TAKE 1 TABLET BY MOUTH  EVERY DAY 90 tablet 1  . glimepiride (AMARYL) 2 MG tablet TAKE 1 TABLET BY MOUTH EVERY DAY WITH BREAKFAST 90 tablet 1  . glucose blood (ONETOUCH VERIO) test strip Check Blood Sugar 3 times daily. 100 strip 4  . Insulin Pen Needle (BD PEN NEEDLE NANO 2ND GEN) 32G X 4 MM MISC USE AS DIRECTED WITH LANTUS ONCE A DAY. 100 each 3  . irbesartan (AVAPRO) 75 MG tablet Take 1 tablet (75 mg total) by mouth daily. 90 tablet 3  . Lancets (ONETOUCH DELICA PLUS LANCET33G) MISC USE AS DIRECTED TO TEST DAILY 100 each 12  . meclizine (ANTIVERT) 12.5 MG tablet Take 1 tablet (12.5 mg total) by mouth 3 (three) times daily as needed for dizziness. As needed for   Dizziness or nausea 30 tablet 1  . nystatin cream (MYCOSTATIN) Apply 1 application topically 2 (two) times daily. (Patient taking differently: Apply 1 application  topically 2 (two) times daily as needed (rash).) 30 g 1  . ondansetron (ZOFRAN) 8 MG tablet Take 1 tablet (8 mg total) by mouth every 8 (eight) hours as needed for nausea or vomiting. Start on third day after chemotherapy. 30 tablet 1  . pantoprazole (PROTONIX) 40 MG tablet Take 1 tablet (40 mg total) by mouth daily. 30 tablet 0  . prochlorperazine (COMPAZINE) 10 MG tablet Take 1 tablet (10 mg total) by mouth every 6 (six) hours as needed for nausea or vomiting (Nausea or vomiting). 30 tablet 1  . SEMGLEE, YFGN, 100 UNIT/ML Pen Inject 14 Units into the skin at bedtime.    . Vitamin D, Ergocalciferol, (DRISDOL) 1.25 MG (50000 UNIT) CAPS capsule TAKE 1 CAPSULE (50,000 UNITS TOTAL) BY MOUTH EVERY 7 (SEVEN) DAYS. 5 capsule 4  . zolpidem (AMBIEN) 5 MG tablet Take 1 tablet (5 mg total) by mouth at bedtime as needed for sleep. (Patient taking differently: Take 5 mg by mouth at bedtime.) 30 tablet 2  . hyoscyamine (LEVSIN SL)  0.125 MG SL tablet Place 1 tablet (0.125 mg total) under the tongue every 6 (six) hours as needed. 30 tablet 1   No facility-administered medications prior to visit.    No Known  Allergies  Review of Systems  Constitutional:  Positive for malaise/fatigue. Negative for fever.  HENT:  Negative for congestion.   Eyes:  Negative for blurred vision.  Respiratory:  Negative for shortness of breath.   Cardiovascular:  Negative for chest pain, palpitations and leg swelling.  Gastrointestinal:  Positive for abdominal pain and diarrhea. Negative for blood in stool and nausea.  Genitourinary:  Negative for dysuria and frequency.  Musculoskeletal:  Negative for falls.  Skin:  Negative for rash.  Neurological:  Negative for dizziness, loss of consciousness and headaches.  Endo/Heme/Allergies:  Negative for environmental allergies.  Psychiatric/Behavioral:  Negative for depression. The patient is not nervous/anxious.        Objective:    Physical Exam Constitutional:      General: She is not in acute distress.    Appearance: Normal appearance. She is well-developed. She is not toxic-appearing.  HENT:     Head: Normocephalic and atraumatic.     Right Ear: External ear normal.     Left Ear: External ear normal.     Nose: Nose normal.  Eyes:     General:        Right eye: No discharge.        Left eye: No discharge.     Conjunctiva/sclera: Conjunctivae normal.  Neck:     Thyroid: No thyromegaly.  Cardiovascular:     Rate and Rhythm: Normal rate and regular rhythm.     Heart sounds: Normal heart sounds. No murmur heard. Pulmonary:     Effort: Pulmonary effort is normal. No respiratory distress.     Breath sounds: Normal breath sounds.  Abdominal:     General: Bowel sounds are normal.     Palpations: Abdomen is soft.     Tenderness: There is no abdominal tenderness. There is no guarding.  Musculoskeletal:        General: Normal range of motion.     Cervical back: Neck supple.  Lymphadenopathy:     Cervical: No cervical adenopathy.  Skin:    General: Skin is warm and dry.  Neurological:     Mental Status: She is alert and oriented to person, place, and time.   Psychiatric:        Mood and Affect: Mood normal.        Behavior: Behavior normal.        Thought Content: Thought content normal.        Judgment: Judgment normal.    BP (!) 96/48 (BP Location: Right Arm, Patient Position: Sitting, Cuff Size: Large)   Pulse 82   Temp 97.8 F (36.6 C) (Oral)   Resp 18   Ht 5\' 4"  (1.626 m)   Wt 213 lb 3.2 oz (96.7 kg)   SpO2 96%   BMI 36.60 kg/m  Wt Readings from Last 3 Encounters:  04/16/23 213 lb 3.2 oz (96.7 kg)  04/12/23 229 lb 15 oz (104.3 kg)  03/21/23 212 lb (96.2 kg)    Diabetic Foot Exam - Simple   No data filed    Lab Results  Component Value Date   WBC 22.7 (H) 04/12/2023   HGB 8.9 (L) 04/12/2023   HCT 27.3 (L) 04/12/2023   PLT 115 (L) 04/12/2023   GLUCOSE 105 (H) 04/12/2023   CHOL 191 02/08/2023  TRIG 275.0 (H) 02/08/2023   HDL 47.40 02/08/2023   LDLDIRECT 71.0 04/11/2022   LDLCALC 89 02/08/2023   ALT 42 04/12/2023   AST 32 04/12/2023   NA 138 04/12/2023   K 4.2 04/12/2023   CL 111 04/12/2023   CREATININE 1.33 (H) 04/12/2023   BUN 20 04/12/2023   CO2 18 (L) 04/12/2023   TSH 2.29 02/08/2023   INR 1.1 (H) 03/22/2021   HGBA1C 7.6 (H) 02/08/2023   MICROALBUR <0.7 02/08/2023    Lab Results  Component Value Date   TSH 2.29 02/08/2023   Lab Results  Component Value Date   WBC 22.7 (H) 04/12/2023   HGB 8.9 (L) 04/12/2023   HCT 27.3 (L) 04/12/2023   MCV 100.0 04/12/2023   PLT 115 (L) 04/12/2023   Lab Results  Component Value Date   NA 138 04/12/2023   K 4.2 04/12/2023   CHLORIDE 104 11/01/2016   CO2 18 (L) 04/12/2023   GLUCOSE 105 (H) 04/12/2023   BUN 20 04/12/2023   CREATININE 1.33 (H) 04/12/2023   BILITOT 0.4 04/12/2023   ALKPHOS 145 (H) 04/12/2023   AST 32 04/12/2023   ALT 42 04/12/2023   PROT 5.0 (L) 04/12/2023   ALBUMIN 2.2 (L) 04/12/2023   CALCIUM 9.8 04/12/2023   ANIONGAP 9 04/12/2023   EGFR 50 (L) 11/01/2016   GFR 31.61 (L) 02/08/2023   Lab Results  Component Value Date   CHOL 191  02/08/2023   Lab Results  Component Value Date   HDL 47.40 02/08/2023   Lab Results  Component Value Date   LDLCALC 89 02/08/2023   Lab Results  Component Value Date   TRIG 275.0 (H) 02/08/2023   Lab Results  Component Value Date   CHOLHDL 4 02/08/2023   Lab Results  Component Value Date   HGBA1C 7.6 (H) 02/08/2023       Assessment & Plan:  Eosinophilia, unspecified type Assessment & Plan: Repeat cbc   Chronic systolic heart failure (HCC) Assessment & Plan: No recent exacerbation  Orders: -     AMB Referral [facility] Care Management  Primary hypertension Assessment & Plan: Well controlled, no changes to meds. Encouraged heart healthy diet such as the DASH diet and exercise as tolerated. Well controlled, no changes to meds. Encouraged heart healthy diet such as the DASH diet and exercise as tolerated.   Orders: -     Comprehensive metabolic panel  Small cell carcinoma of middle lobe of right lung (HCC) Assessment & Plan: Following with oncology and pursing treatments  Orders: -     AMB Referral [facility]I Care Management  Hypomagnesemia -     Magnesium  Leukocytosis, unspecified type Assessment & Plan: Repeat cbc  Orders: -     AMB Referral [facility] Care Management -     CBC with Differential/Platelet  Skin ulcer of sacrum, unspecified ulcer stage (HCC) -     AMB Referral [facility]BCI Care Management  Diarrhea, unspecified type -     Clostridium Difficile by PCR -     Cdiff NAA+O+P+Stool Culture -     Fecal lactoferrin, quant  Other orders -     Hyoscyamine Sulfate; Place 1 tablet (0.125 mg total) under the tongue every 6 (six) hours as needed.  Dispense: 30 tablet; Refill: 1    Assessment and Plan Assessment & Plan      Danise Edge, MD

## 2023-04-17 ENCOUNTER — Other Ambulatory Visit

## 2023-04-17 ENCOUNTER — Encounter: Payer: Self-pay | Admitting: Hematology & Oncology

## 2023-04-17 ENCOUNTER — Encounter: Payer: Self-pay | Admitting: Family Medicine

## 2023-04-17 ENCOUNTER — Inpatient Hospital Stay: Payer: No Typology Code available for payment source

## 2023-04-17 DIAGNOSIS — C342 Malignant neoplasm of middle lobe, bronchus or lung: Secondary | ICD-10-CM

## 2023-04-17 DIAGNOSIS — R197 Diarrhea, unspecified: Secondary | ICD-10-CM

## 2023-04-17 LAB — COMPREHENSIVE METABOLIC PANEL
ALT: 29 U/L (ref 0–35)
AST: 32 U/L (ref 0–37)
Albumin: 3.1 g/dL — ABNORMAL LOW (ref 3.5–5.2)
Alkaline Phosphatase: 168 U/L — ABNORMAL HIGH (ref 39–117)
BUN: 15 mg/dL (ref 6–23)
CO2: 26 meq/L (ref 19–32)
Calcium: 9.3 mg/dL (ref 8.4–10.5)
Chloride: 103 meq/L (ref 96–112)
Creatinine, Ser: 1.24 mg/dL — ABNORMAL HIGH (ref 0.40–1.20)
GFR: 41.26 mL/min — ABNORMAL LOW (ref >60.00)
Glucose, Bld: 86 mg/dL (ref 70–99)
Potassium: 4.2 meq/L (ref 3.5–5.1)
Sodium: 140 meq/L (ref 135–145)
Total Bilirubin: 0.5 mg/dL (ref 0.2–1.2)
Total Protein: 5.9 g/dL — ABNORMAL LOW (ref 6.0–8.3)

## 2023-04-17 LAB — CBC WITH DIFFERENTIAL/PLATELET
Basophils Absolute: 0.1 10*3/uL (ref 0.0–0.1)
Basophils Relative: 0.6 % (ref 0.0–3.0)
Eosinophils Absolute: 0 10*3/uL (ref 0.0–0.7)
Eosinophils Relative: 0 % (ref 0.0–5.0)
HCT: 30.5 % — ABNORMAL LOW (ref 36.0–46.0)
Hemoglobin: 9.7 g/dL — ABNORMAL LOW (ref 12.0–15.0)
Lymphocytes Relative: 10.2 % — ABNORMAL LOW (ref 12.0–46.0)
Lymphs Abs: 1.7 10*3/uL (ref 0.7–4.0)
MCHC: 32 g/dL (ref 30.0–36.0)
MCV: 102.3 fl — ABNORMAL HIGH (ref 78.0–100.0)
Monocytes Absolute: 1.2 10*3/uL — ABNORMAL HIGH (ref 0.1–1.0)
Monocytes Relative: 7 % (ref 3.0–12.0)
Neutro Abs: 13.6 10*3/uL — ABNORMAL HIGH (ref 1.4–7.7)
Neutrophils Relative %: 82.2 % — ABNORMAL HIGH (ref 43.0–77.0)
Platelets: 273 10*3/uL (ref 150.0–400.0)
RBC: 2.98 Mil/uL — ABNORMAL LOW (ref 3.87–5.11)
RDW: 16.5 % — ABNORMAL HIGH (ref 11.5–15.5)
WBC: 16.6 10*3/uL — ABNORMAL HIGH (ref 4.0–10.5)

## 2023-04-17 LAB — MAGNESIUM: Magnesium: 1.5 mg/dL (ref 1.5–2.5)

## 2023-04-17 NOTE — Progress Notes (Signed)
 Specimen drop off

## 2023-04-18 ENCOUNTER — Encounter: Payer: Self-pay | Admitting: Family Medicine

## 2023-04-18 ENCOUNTER — Inpatient Hospital Stay: Payer: No Typology Code available for payment source

## 2023-04-18 ENCOUNTER — Encounter: Payer: Self-pay | Admitting: *Deleted

## 2023-04-18 ENCOUNTER — Other Ambulatory Visit: Payer: Self-pay | Admitting: Medical Oncology

## 2023-04-18 ENCOUNTER — Telehealth: Payer: Self-pay | Admitting: Family Medicine

## 2023-04-18 ENCOUNTER — Telehealth: Payer: Self-pay | Admitting: *Deleted

## 2023-04-18 ENCOUNTER — Other Ambulatory Visit: Payer: Self-pay | Admitting: Family Medicine

## 2023-04-18 DIAGNOSIS — C651 Malignant neoplasm of right renal pelvis: Secondary | ICD-10-CM

## 2023-04-18 DIAGNOSIS — C642 Malignant neoplasm of left kidney, except renal pelvis: Secondary | ICD-10-CM

## 2023-04-18 DIAGNOSIS — D649 Anemia, unspecified: Secondary | ICD-10-CM

## 2023-04-18 DIAGNOSIS — D4989 Neoplasm of unspecified behavior of other specified sites: Secondary | ICD-10-CM

## 2023-04-18 DIAGNOSIS — R918 Other nonspecific abnormal finding of lung field: Secondary | ICD-10-CM

## 2023-04-18 DIAGNOSIS — C342 Malignant neoplasm of middle lobe, bronchus or lung: Secondary | ICD-10-CM

## 2023-04-18 DIAGNOSIS — C3490 Malignant neoplasm of unspecified part of unspecified bronchus or lung: Secondary | ICD-10-CM | POA: Insufficient documentation

## 2023-04-18 DIAGNOSIS — L98429 Non-pressure chronic ulcer of back with unspecified severity: Secondary | ICD-10-CM | POA: Insufficient documentation

## 2023-04-18 DIAGNOSIS — C649 Malignant neoplasm of unspecified kidney, except renal pelvis: Secondary | ICD-10-CM

## 2023-04-18 LAB — FECAL LACTOFERRIN, QUANT
Fecal Lactoferrin: POSITIVE — AB
MICRO NUMBER:: 16215338
SPECIMEN QUALITY:: ADEQUATE

## 2023-04-18 MED ORDER — VANCOMYCIN HCL 125 MG PO CAPS
125.0000 mg | ORAL_CAPSULE | Freq: Four times a day (QID) | ORAL | 0 refills | Status: AC
Start: 2023-04-18 — End: 2023-04-28

## 2023-04-18 NOTE — Assessment & Plan Note (Signed)
 hgba1c acceptable, minimize simple carbs. Increase exercise as tolerated. Continue current meds

## 2023-04-18 NOTE — Assessment & Plan Note (Signed)
 Normal but low normal will continue to monitor

## 2023-04-18 NOTE — Assessment & Plan Note (Signed)
 Just released from the hospital after getting a respiratory infection from her family members and becoming septic. She is now home and accompanied by her daughter and husband and they report she is eating some better at home. Continue to monitor labs

## 2023-04-18 NOTE — Progress Notes (Signed)
 Subjective:    Patient ID: Amanda Crawford, female    DOB: [birth year], 78 y.o.   MRN: [id number]  Chief Complaint  Patient presents with   Follow-up    HPI Patient is in today for hospital follow up. She is accompanied by her husband and daughter. She has just been released from the hospital about going into septic shock after coming down with a respiratory illness while being treated for lung cancer. She is feeling better from a respiratory perspective but she is experiencing multiple episodes of diarrhea a day. No bloody or tarry stool. No fevers or chills. She is starting to eat better. She does note some swelling in her ankles and some fatigue as well. No CP/palp/GU concerns.   Past Medical History:  Diagnosis Date   Allergic state 03/12/2015   Arthritis    Cirrhosis (HCC)    Colon polyp 11/19/2014   Dermatitis 03/12/2015   Diarrhea 06/29/2016   Diverticulitis    DM (diabetes mellitus), type 2 (HCC) 06/29/2016   GERD (gastroesophageal reflux disease)    occ   Gout 03/12/2015   H/O measles    H/O mumps    Headache(784.0)    migraines   History of chicken pox 11/23/2014   Hyperglycemia 06/29/2016   Insomnia 02/13/2023   Lung cancer (HCC)    Migraine 11/23/2014   Obesity 11/23/2014   Pneumonia    child   Preventative health care 09/12/2015   Primary hypertension 12/15/2020   Renal cell cancer (HCC)    renal cell ca dx 9/01 and 8/08;   Renal insufficiency    Shortness of breath    occ   Skin lesion of breast 03/12/2015   Sleep apnea 01/24/2023   recently dx 01/24/23 - CPAP ordered by not received yet   Small cell carcinoma of middle lobe of right lung (HCC) 03/14/2023   Small cell lung cancer, right middle lobe (HCC) 03/14/2023    Past Surgical History:  Procedure Laterality Date   ABDOMINAL HYSTERECTOMY     ANAL RECTAL MANOMETRY N/A 06/29/2021   Procedure: ANO RECTAL MANOMETRY;  Surgeon: Shellia Cleverly, DO;  Location: [facility];  Service:  Gastroenterology;  Laterality: N/A;   APPENDECTOMY     BRONCHIAL BIOPSY  02/20/2023   Procedure: BRONCHIAL BIOPSIES;  Surgeon: Leslye Peer, MD;  Location: [facility];  Service: Pulmonary;;   BRONCHIAL BRUSHINGS  02/20/2023   Procedure: BRONCHIAL BRUSHINGS;  Surgeon: Leslye Peer, MD;  Location: [facility] ENDOSCOPY;  Service: Pulmonary;;   BRONCHIAL NEEDLE ASPIRATION BIOPSY  02/20/2023   Procedure: BRONCHIAL NEEDLE ASPIRATION BIOPSIES;  Surgeon: Leslye Peer, MD;  Location: [facility];  Service: Pulmonary;;   CARDIAC CATHETERIZATION     yrs ago neg   CHOLECYSTECTOMY     COLONOSCOPY  2018   ESOPHAGOGASTRODUODENOSCOPY     years ago   FINE NEEDLE ASPIRATION  02/20/2023   Procedure: FINE NEEDLE ASPIRATION (FNA) LINEAR;  Surgeon: Leslye Peer, MD;  Location: [facility];  Service: Pulmonary;;   IR IMAGING GUIDED PORT INSERTION  03/21/2023   IR RADIOLOGIST EVAL & MGMT  07/17/2018   IR RADIOLOGIST EVAL & MGMT  09/12/2018   IR RADIOLOGIST EVAL & MGMT  06/18/2019   IR RADIOLOGIST EVAL & MGMT  01/13/2020   IR RADIOLOGIST EVAL & MGMT  07/08/2020   IR RADIOLOGIST EVAL & MGMT  10/12/2020   KIDNEY SURGERY  2001   Removed of left kidney    LUNG CANCER SURGERY Left 2008  RADIOLOGY WITH ANESTHESIA N/A 08/21/2018   Procedure: CT WITH ANESTHESIA RENAL CRYOABLATION;  Surgeon: Oley Balm, MD;  Location: [facility];  Service: Radiology;  Laterality: N/A;   RENAL MASS EXCISION Left 2001   RIGHT/LEFT HEART CATH AND CORONARY ANGIOGRAPHY N/A 07/27/2016   Procedure: Right/Left Heart Cath and Coronary Angiography;  Surgeon: Marykay Lex, MD;  Location: [facility] INVASIVE CV LAB;  Service: Cardiovascular;  Laterality: N/A;   THORACOTOMY Left 05/09/2012   Procedure: THORACOTOMY MAJOR;  Surgeon: Alleen Borne, MD;  Location: [facility];  Service: Thoracic;  Laterality: Left;   VIDEO BRONCHOSCOPY N/A 05/09/2012   Procedure: VIDEO BRONCHOSCOPY;  Surgeon: Alleen Borne, MD;  Location: [facility] OR;  Service: Thoracic;   Laterality: N/A;   VIDEO BRONCHOSCOPY WITH ENDOBRONCHIAL ULTRASOUND  02/20/2023   Procedure: VIDEO BRONCHOSCOPY WITH ENDOBRONCHIAL ULTRASOUND;  Surgeon: Leslye Peer, MD;  Location: [facility];  Service: Pulmonary;;   WEDGE RESECTION Left 05/09/2012   Procedure: LEFT UPPER LOBE WEDGE RESECTION;  Surgeon: Alleen Borne, MD;  Location: [facility] OR;  Service: Thoracic;  Laterality: Left;    Family History  Problem Relation Age of Onset   Dementia Mother    Heart failure Father    Diabetes Father    Hyperlipidemia Father    Heart disease Father    Hypertension Father    Asthma Brother    Asthma Daughter    Alcohol abuse Maternal Aunt    Colon cancer Paternal Aunt    Cancer Paternal Grandmother    Hearing loss Paternal Grandfather    Stroke Paternal Grandfather     Social History   Socioeconomic History   Marital status: Married    Spouse name: Not on file   Number of children: 2   Years of education: Not on file   Highest education level: 12th grade  Occupational History   Occupation: retired  Tobacco Use   Smoking status: Former    Current packs/day: 0.00    Average packs/day: 0.5 packs/day for 35.0 years (17.5 ttl pk-yrs)    Types: Cigarettes    Start date: 05/08/1971    Quit date: 05/08/2006    Years since quitting: 16.9   Smokeless tobacco: Never  Vaping Use   Vaping status: Never Used  Substance and Sexual Activity   Alcohol use: Yes    Comment: seldomly   Drug use: No   Sexual activity: Not Currently    Comment: lives with husband, no dietary restrictions.   Other Topics Concern   Not on file  Social History Narrative   Not on file   Social Drivers of Health   Financial Resource Strain: Low Risk  (07/05/2020)   Overall Financial Resource Strain (CARDIA)    Difficulty of Paying Living Expenses: Not hard at all  Food Insecurity: No Food Insecurity (04/13/2023)   Hunger Vital Sign    Worried About Running Out of Food in the Last Year: Never true    Ran Out of  Food in the Last Year: Never true  Transportation Needs: No Transportation Needs (04/13/2023)   PRAPARE - Administrator, Civil Service (Medical): No    Lack of Transportation (Non-Medical): No  Physical Activity: Inactive (07/05/2020)   Exercise Vital Sign    Days of Exercise per Week: 0 days    Minutes of Exercise per Session: 0 min  Stress: No Stress Concern Present (08/12/2022)   Received from [facility] of Occupational Health - Occupational Stress Questionnaire  Feeling of Stress : Not at all  Social Connections: Socially Integrated (04/09/2023)   Social Connection and Isolation Panel [NHANES]    Frequency of Communication with Friends and Family: Three times a week    Frequency of Social Gatherings with Friends and Family: Once a week    Attends Religious Services: More than 4 times per year    Active Member of [employer] or Organizations: Yes    Attends Engineer, structural: More than 4 times per year    Marital Status: Married  Catering manager Violence: Patient Unable To Answer (04/13/2023)   Humiliation, Afraid, Rape, and Kick questionnaire    Fear of Current or Ex-Partner: Patient unable to answer    Emotionally Abused: Patient unable to answer    Physically Abused: Patient unable to answer    Sexually Abused: Patient unable to answer    Outpatient Medications Prior to Visit  Medication Sig Dispense Refill   acidophilus (RISAQUAD) CAPS capsule Take 1 capsule by mouth daily. 30 capsule 0   allopurinol (ZYLOPRIM) 300 MG tablet TAKE 1 TABLET BY MOUTH EVERY DAY 90 tablet 1   aspirin EC 81 MG tablet Take 81 mg by mouth daily.     atorvastatin (LIPITOR) 20 MG tablet Take 1 tablet (20 mg total) by mouth daily. 90 tablet 3   carvedilol (COREG) 3.125 MG tablet Take 0.5 tablets (1.5625 mg total) by mouth 2 (two) times daily with a meal.     dexamethasone (DECADRON) 4 MG tablet Take 2 tablets daily for 2 days, on days 4 and 5.Take with food. Every  21 days. 30 tablet 1   diphenhydramine-acetaminophen (TYLENOL PM) 25-500 MG TABS Take 1 tablet by mouth at bedtime as needed (sleep).     ferrous sulfate 325 (65 FE) MG tablet Take 325 mg by mouth at bedtime.     furosemide (LASIX) 40 MG tablet TAKE 1 TABLET BY MOUTH EVERY DAY 90 tablet 1   glimepiride (AMARYL) 2 MG tablet TAKE 1 TABLET BY MOUTH EVERY DAY WITH BREAKFAST 90 tablet 1   glucose blood (ONETOUCH VERIO) test strip Check Blood Sugar 3 times daily. 100 strip 4   Insulin Pen Needle (BD PEN NEEDLE NANO 2ND GEN) 32G X 4 MM MISC USE AS DIRECTED WITH LANTUS ONCE A DAY. 100 each 3   irbesartan (AVAPRO) 75 MG tablet Take 1 tablet (75 mg total) by mouth daily. 90 tablet 3   Lancets (ONETOUCH DELICA PLUS LANCET33G) MISC USE AS DIRECTED TO TEST DAILY 100 each 12   meclizine (ANTIVERT) 12.5 MG tablet Take 1 tablet (12.5 mg total) by mouth 3 (three) times daily as needed for dizziness. As needed for   Dizziness or nausea 30 tablet 1   nystatin cream (MYCOSTATIN) Apply 1 application topically 2 (two) times daily. (Patient taking differently: Apply 1 application  topically 2 (two) times daily as needed (rash).) 30 g 1   ondansetron (ZOFRAN) 8 MG tablet Take 1 tablet (8 mg total) by mouth every 8 (eight) hours as needed for nausea or vomiting. Start on third day after chemotherapy. 30 tablet 1   pantoprazole (PROTONIX) 40 MG tablet Take 1 tablet (40 mg total) by mouth daily. 30 tablet 0   prochlorperazine (COMPAZINE) 10 MG tablet Take 1 tablet (10 mg total) by mouth every 6 (six) hours as needed for nausea or vomiting (Nausea or vomiting). 30 tablet 1   SEMGLEE, YFGN, 100 UNIT/ML Pen Inject 14 Units into the skin at bedtime.  Vitamin D, Ergocalciferol, (DRISDOL) 1.25 MG (50000 UNIT) CAPS capsule TAKE 1 CAPSULE (50,000 UNITS TOTAL) BY MOUTH EVERY 7 (SEVEN) DAYS. 5 capsule 4   zolpidem (AMBIEN) 5 MG tablet Take 1 tablet (5 mg total) by mouth at bedtime as needed for sleep. (Patient taking differently:  Take 5 mg by mouth at bedtime.) 30 tablet 2   hyoscyamine (LEVSIN SL) 0.125 MG SL tablet Place 1 tablet (0.125 mg total) under the tongue every 6 (six) hours as needed. 30 tablet 1   No facility-administered medications prior to visit.    No Known Allergies  Review of Systems  Constitutional:  Positive for malaise/fatigue. Negative for fever.  HENT:  Negative for congestion.   Eyes:  Negative for blurred vision.  Respiratory:  Negative for shortness of breath.   Cardiovascular:  Positive for leg swelling. Negative for chest pain and palpitations.  Gastrointestinal:  Positive for abdominal pain and diarrhea. Negative for blood in stool and nausea.  Genitourinary:  Negative for dysuria and frequency.  Musculoskeletal:  Negative for falls.  Skin:  Negative for rash.  Neurological:  Negative for dizziness, loss of consciousness and headaches.  Endo/Heme/Allergies:  Negative for environmental allergies.  Psychiatric/Behavioral:  Negative for depression. The patient is not nervous/anxious.        Objective:    Physical Exam Constitutional:      General: She is not in acute distress.    Appearance: Normal appearance. She is well-developed. She is not toxic-appearing.  HENT:     Head: Normocephalic and atraumatic.     Right Ear: External ear normal.     Left Ear: External ear normal.     Nose: Nose normal.  Eyes:     General:        Right eye: No discharge.        Left eye: No discharge.     Conjunctiva/sclera: Conjunctivae normal.  Neck:     Thyroid: No thyromegaly.  Cardiovascular:     Rate and Rhythm: Normal rate and regular rhythm.     Heart sounds: Normal heart sounds. No murmur heard. Pulmonary:     Effort: Pulmonary effort is normal. No respiratory distress.     Breath sounds: Normal breath sounds.  Abdominal:     General: Bowel sounds are normal.     Palpations: Abdomen is soft.     Tenderness: There is no abdominal tenderness. There is no guarding.   Musculoskeletal:        General: Normal range of motion.     Cervical back: Neck supple.     Right lower leg: Edema present.     Left lower leg: Edema present.  Lymphadenopathy:     Cervical: No cervical adenopathy.  Skin:    General: Skin is warm and dry.  Neurological:     Mental Status: She is alert and oriented to person, place, and time.  Psychiatric:        Mood and Affect: Mood normal.        Behavior: Behavior normal.        Thought Content: Thought content normal.        Judgment: Judgment normal.     BP (!) 96/48 (BP Location: Right Arm, Patient Position: Sitting, Cuff Size: Large)   Pulse 82   Temp 97.8 F (36.6 C) (Oral)   Resp 18   Ht 5\' 4"  (1.626 m)   Wt 213 lb 3.2 oz (96.7 kg)   SpO2 96%   BMI 36.60 kg/m  Wt Readings from Last 3 Encounters:  04/16/23 213 lb 3.2 oz (96.7 kg)  04/12/23 229 lb 15 oz (104.3 kg)  03/21/23 212 lb (96.2 kg)    Diabetic Foot Exam - Simple   No data filed    Lab Results  Component Value Date   WBC 16.6 (H) 04/16/2023   HGB 9.7 (L) 04/16/2023   HCT 30.5 (L) 04/16/2023   PLT 273.0 04/16/2023   GLUCOSE 86 04/16/2023   CHOL 191 02/08/2023   TRIG 275.0 (H) 02/08/2023   HDL 47.40 02/08/2023   LDLDIRECT 71.0 04/11/2022   LDLCALC 89 02/08/2023   ALT 29 04/16/2023   AST 32 04/16/2023   NA 140 04/16/2023   K 4.2 04/16/2023   CL 103 04/16/2023   CREATININE 1.24 (H) 04/16/2023   BUN 15 04/16/2023   CO2 26 04/16/2023   TSH 2.29 02/08/2023   INR 1.1 (H) 03/22/2021   HGBA1C 7.6 (H) 02/08/2023   MICROALBUR <0.7 02/08/2023    Lab Results  Component Value Date   TSH 2.29 02/08/2023   Lab Results  Component Value Date   WBC 16.6 (H) 04/16/2023   HGB 9.7 (L) 04/16/2023   HCT 30.5 (L) 04/16/2023   MCV 102.3 (H) 04/16/2023   PLT 273.0 04/16/2023   Lab Results  Component Value Date   NA 140 04/16/2023   K 4.2 04/16/2023   CHLORIDE 104 11/01/2016   CO2 26 04/16/2023   GLUCOSE 86 04/16/2023   BUN 15 04/16/2023    CREATININE 1.24 (H) 04/16/2023   BILITOT 0.5 04/16/2023   ALKPHOS 168 (H) 04/16/2023   AST 32 04/16/2023   ALT 29 04/16/2023   PROT 5.9 (L) 04/16/2023   ALBUMIN 3.1 (L) 04/16/2023   CALCIUM 9.3 04/16/2023   ANIONGAP 9 04/12/2023   EGFR 50 (L) 11/01/2016   GFR 41.26 (L) 04/16/2023   Lab Results  Component Value Date   CHOL 191 02/08/2023   Lab Results  Component Value Date   HDL 47.40 02/08/2023   Lab Results  Component Value Date   LDLCALC 89 02/08/2023   Lab Results  Component Value Date   TRIG 275.0 (H) 02/08/2023   Lab Results  Component Value Date   CHOLHDL 4 02/08/2023   Lab Results  Component Value Date   HGBA1C 7.6 (H) 02/08/2023       Assessment & Plan:  Eosinophilia, unspecified type Assessment & Plan: Repeat cbc   Chronic systolic heart failure (HCC) Assessment & Plan: No recent exacerbation  Orders: -     AMB Referral [facility] Care Management  Primary hypertension Assessment & Plan: Well controlled, no changes to meds. Encouraged heart healthy diet such as the DASH diet and exercise as tolerated. Well controlled, no changes to meds. Encouraged heart healthy diet such as the DASH diet and exercise as tolerated.   Orders: -     Comprehensive metabolic panel  Small cell carcinoma of middle lobe of right lung (HCC) Assessment & Plan: Following with oncology and pursing treatments  Orders: -     AMB Referral [facility]BCI Care Management  Hypomagnesemia Assessment & Plan: Normal but low normal will continue to monitor  Orders: -     Magnesium  Leukocytosis, unspecified type Assessment & Plan: Repeat cbc  Orders: -     AMB Referral [facility]I Care Management -     CBC with Differential/Platelet  Skin ulcer of sacrum, unspecified ulcer stage (HCC) -     AMB Referral [facility]BCI Care Management  Diarrhea, unspecified  type Assessment & Plan: Cdiff is positive. She is prescribed Vancomycin 125 mg qid and she needs to start a probiotic. Increase fluids    Orders: -     Cdiff NAA+O+P+Stool Culture; Future -     Fecal lactoferrin, quant; Future  Type 2 diabetes mellitus without complication, without long-term current use of insulin (HCC) Assessment & Plan: hgba1c acceptable, minimize simple carbs. Increase exercise as tolerated. Continue current meds    Malignant neoplasm of lung, unspecified laterality, unspecified part of lung [facility]) Assessment & Plan: She is following with oncology and has an appointment next week.   [facility]) Assessment & Plan: Just released from the hospital after getting a respiratory infection from her family members and becoming septic. She is now home and accompanied by her daughter and husband and they report she is eating some better at home. Continue to monitor labs   Other orders -     Hyoscyamine Sulfate; Place 1 tablet (0.125 mg total) under the tongue every 6 (six) hours as needed.  Dispense: 30 tablet; Refill: 1    Danise Edge, MD

## 2023-04-18 NOTE — Assessment & Plan Note (Signed)
 She is following with oncology and has an appointment next week.

## 2023-04-18 NOTE — Progress Notes (Signed)
 After patient's first cycle, she was admitted for septic shock. She was discharged last week. Dr Myna Hidalgo requested that patient's treatment be delayed by one week. Rescheduled to 04/23/2023.  Oncology Nurse Navigator Documentation     04/18/2023    9:45 AM  Oncology Nurse Navigator Flowsheets  Navigator Follow Up Date: 04/23/2023  Navigator Follow Up Reason: Follow-up Appointment;Chemotherapy  Navigator Location [facility]  Navigator Encounter Type Appt/Treatment Plan Review  Patient Visit Type MedOnc  Treatment Phase Active Tx  Barriers/Navigation Needs Coordination of Care;Education  Interventions None Required  Acuity Level 2-Minimal Needs (1-2 Barriers Identified)  Support Groups/Services Friends and Family  Time Spent with Patient 15

## 2023-04-18 NOTE — Patient Outreach (Signed)
 Care Management  Transitions of Care Program Transitions of Care Post-discharge week 2/ day # 5   04/18/2023 Name: Amanda Crawford MRN: [id number] DOB: Mar 20, 1945  Subjective: Amanda Crawford is a 78 y.o. year old female who is a primary care patient of Amanda Canary, MD. The Care Management team Engaged with patient Engaged with patient by telephone to assess and address transitions of care needs.   Consent to Services:  Patient was given information about care management services, agreed to services, and gave verbal consent to participate.   Enrolled into TOC 30-day program:  04/18/23- today-- caregiver initially declined at time of initial TOC call on 04/13/23  Assessment:   "Thank you for calling me back.  I just found these antibiotics on the table, from before I went into the hospital-- and I am wondering should I have started taking them again when I got home from the hospital?  Should I start taking them now?  Okay, I will throw them out.  I have some new swelling in my ankles that I have never had before.  I did see my PCP this week, she gave me a good report.  The sore on my backside is getting better and we are keeping it very clean and dry and changing the dressing several times every day; it is much better already; yes, please do start calling me every week for the next few weeks.  I will start weighing myself at home; I would appreciate the extra calls"  Denies clinical concerns and sounds to be in no distress throughout [facility] 30-day program outreach call today           SDOH Interventions    Flowsheet Row Telephone from 04/13/2023 in [place] [facility] DEPARTMENT Clinical Support from 07/05/2020 in [facility] at [facility]  SDOH Interventions    Food Insecurity Interventions Intervention Not Indicated --  Housing Interventions Intervention Not Indicated --  Transportation Interventions Intervention Not Indicated  [family provides  transportation] --  Utilities Interventions Intervention Not Indicated --  Physical Activity Interventions -- Intervention Not Indicated  [pt has set a goal to increase activity]        Goals Addressed             This Visit's Progress    TOC 30-day Program Care Plan   On track    Current Barriers:  Medication management daughter now assisting with medication management; patient verbalizes good general understanding of current medication regimen Provider appointments confirmed has attended hospital follow up office visit with PCP on 04/16/23 Home Health services confirmed active with [facility] 04/18/23: Patient called me back after initial TOC call placed on 04/13/23: she has questions about a medication: she had found an old antibiotic bottle at her home, from prior to her recent hospital admission: she is asking if she should have taken this, and also asking if she should start taking it now; reviewed post-hospital discharge instructions: confirmed that patient was instructed to stop taking the antibiotic at time of hospital discharge- patient appreciative of the follow up information Given patient's call-back post- initial TOC call on 04/13/23:  Successfully enrolled into 30-day TOC program today 04/18/23- as week # 2 call Recent hospital admission: 3/9-13/2025: shock secondary to recurrent diverticulitis/ COVID-19; hypoglycemia/ hypotension Independent in self-care; resides with supportive spouse; has local adult children who are also very involved and supportive- adult daughter has initiated helping patient manage medications post-recent hospital discharge- patient was previously  independent in medication management Sacral bedsore- unknown cause 04/18/23: reports that he ankles have been swollen "for the last couple of days;" states she held only one day of the diuretic as advised post-hospital discharge: but "for some reason, my ankles are now puffy;" reports, "I take the fluid pill every day  and they have never been swollen before;"   Discussed possibility of IV fluid from hospital discharge causing this to show up late post-discharge Confirmed that patient does not monitor or record daily weights prior to this admission: "I was never told to, and my ankles never did swell before"  RNCM Clinical Goal(s):  Patient will work with the Care Management team over the next 30 days to address Transition of Care Barriers: Medication Management Provider appointments Home Health services attend all scheduled medical appointments: oncology office visits next week: 3/24; 3/25; 3/26; 3/28 as evidenced by review of same with patient during weekly TOC 30-day program outreaches with RN CM not experience hospital admission as evidenced by review of EMR. Hospital Admissions in last 6 months = 1  through collaboration with RN Care manager, provider, and care team.   Interventions: Evaluation of current treatment plan related to  self management and patient's adherence to plan as established by provider  Transitions of Care:  New goal. 04/18/23: care plan initiated on week # 2: patient/ caregiver initially declined TOC 30-day program participation on 04/13/23; agreed today on 04/18/23 Labs reviewed recent lab results for WBC- confirmed PCP message re: results post- office visit on 04/16/23 Durable Medical Equipment (DME) needs assessed with patient/caregiver Doctor Visits  - discussed the importance of doctor visits Communication with PCP re: successful enrollment into [facility] 30-day program, today 04/18/23  Post discharge activity limitations prescribed by provider reviewed Post-op wound/incision care reviewed with patient/caregiver Discussed current clinical condition:  "Thank you for calling me back.  I just found these antibiotics on the table, from before I went into the hospital-- and I am wondering should I have started taking them again when I got home from the hospital?  Should I start taking them now?   Okay, I will throw them out.  I have some new swelling in my ankles that I have never had before.  I did see my PCP this week, she gave me a good report.  The sore on my backside is getting better and we are keeping it very clean and dry and changing the dressing several times every day; it is much better already; yes, please do start calling me every week for the next few weeks.  I will start weighing myself at home; I would appreciate the extra calls;" she denies specific clinical concerns today Reviewed recent HFU office visit with PCP on 04/16/23- verbalizes good understanding of post-visit instructions and denies questions Thoroughly reinforced care of her sacral bedsore as per post-hospital follow up PCP office visit instructions Home Health PT services- confirmed active; encouraged patient's active participation/ engagement; reinforced role of home health services with importance of participation/ ongoing engagement Confirmed not currently requiring/ using assistive devices/ independent in self-care activities: occasionally uses walker/ [facility] Confirmed patient is NOT currently obtaining daily weights at home: provided education / rationale for daily weight monitoring at home as earliest indicator of fluid retention, along with weight gain guidelines/ action plan for weight gain; importance of taking diuretic as prescribed- she gladly agrees to begin daily weight monitoring at home starting tomorrow morning Confirmed patient continues monitoring blood sugars at home-- reports "they are okay;" she  is unable to review today: home health PT has arrived at her home at time of [facility] call completion TOC 30-day program initial assessment initiated today  Patient Goals/Self-Care Activities: Participate in Transition of Care Program/Attend TOC scheduled calls Take all medications as prescribed Attend all scheduled provider appointments Call provider office for new concerns or questions  Weigh yourself every  day to stay on top of early fluid retention: write down your weights every day so you remember what it is from day to day: follow the weight-gain guidelines and action plan to call your doctor if you gain more than 3 lbs overnight, or 5 lbs in one week Continue monitoring and recording your blood sugars at home Continue working with the home health team that is involved in your care Continue caring for the sore on your bottom exactly like the doctor instructed you to  Follow Up Plan:  Telephone follow up appointment with care management team member scheduled for:  Thursday 04/26/23 at 1:00 pm    Plan for next week's call: Review medications: full review Review daily weights since Thursday 04/19/23 Review home health PT visits Review oncology provider office visit/ WBC from 04/23/23 Reinforce education re: self health management of CHF/ daily weights Progression of TOC 30-day program initial assessment        Plan: Telephone follow up appointment with care management team member scheduled for:   Thursday 04/26/23 at 1:00 pm  Total time spent from review to signing of note/ including any care coordination interventions:  93 minutes; cold call with concerns/ questions/ additional review to address/ creation of complex care plan/ initiation of TOC 30-day program initial assessment  Pls call/ message for questions,  Caryl Pina, RN, BSN, Media planner  Transitions of Care  [facility] - [facility] [phone]: direct office

## 2023-04-18 NOTE — Patient Instructions (Signed)
 Visit Information  Thank you for taking time to visit with me today. Please don't hesitate to contact me if I can be of assistance to you before our next scheduled telephone appointment.  Telephone follow up appointment with care management team member scheduled for:  Thursday 04/26/23 at 1:00 pm  Please call the care guide team at [phone] if you need to cancel or reschedule your appointment.   Patient Goals/Self-Care Activities: Participate in Transition of Care Program/Attend TOC scheduled calls Take all medications as prescribed Attend all scheduled provider appointments Call provider office for new concerns or questions  Weigh yourself every day to stay on top of early fluid retention: write down your weights every day so you remember what it is from day to day: follow the weight-gain guidelines and action plan to call your doctor if you gain more than 3 lbs overnight, or 5 lbs in one week Continue monitoring and recording your blood sugars at home Continue working with the home health team that is involved in your care Continue caring for the sore on your bottom exactly like the doctor instructed you to  Following is a copy of your care plan:   Goals Addressed             This Visit's Progress    TOC 30-day Program Care Plan   On track    Current Barriers:  Medication management daughter now assisting with medication management; patient verbalizes good general understanding of current medication regimen Provider appointments confirmed has attended hospital follow up office visit with PCP on 04/16/23 Home Health services confirmed active with [facility] 04/18/23: Patient called me back after initial TOC call placed on 04/13/23: she has questions about a medication: she had found an old antibiotic bottle at her home, from prior to her recent hospital admission: she is asking if she should have taken this, and also asking if she should start taking it now; reviewed post-hospital  discharge instructions: confirmed that patient was instructed to stop taking the antibiotic at time of hospital discharge- patient appreciative of the follow up information Given patient's call-back post- initial TOC call on 04/13/23:  Successfully enrolled into 30-day TOC program today 04/18/23- as week # 2 call Recent hospital admission: 3/9-13/2025: shock secondary to recurrent diverticulitis/ COVID-19; hypoglycemia/ hypotension Independent in self-care; resides with supportive spouse; has local adult children who are also very involved and supportive- adult daughter has initiated helping patient manage medications post-recent hospital discharge- patient was previously independent in medication management Sacral bedsore- unknown cause 04/18/23: reports that he ankles have been swollen "for the last couple of days;" states she held only one day of the diuretic as advised post-hospital discharge: but "for some reason, my ankles are now puffy;" reports, "I take the fluid pill every day and they have never been swollen before;"   Discussed possibility of IV fluid from hospital discharge causing this to show up late post-discharge Confirmed that patient does not monitor or record daily weights prior to this admission: "I was never told to, and my ankles never did swell before"  RNCM Clinical Goal(s):  Patient will work with the Care Management team over the next 30 days to address Transition of Care Barriers: Medication Management Provider appointments Home Health services attend all scheduled medical appointments: oncology office visits next week: 3/24; 3/25; 3/26; 3/28 as evidenced by review of same with patient during weekly TOC 30-day program outreaches with RN CM not experience hospital admission as evidenced by review of EMR. Hospital Admissions  in last 6 months = 1  through collaboration with RN Care manager, provider, and care team.   Interventions: Evaluation of current treatment plan related to   self management and patient's adherence to plan as established by provider  Transitions of Care:  New goal. 04/18/23: care plan initiated on week # 2: patient/ caregiver initially declined TOC 30-day program participation on 04/13/23; agreed today on 04/18/23 Labs reviewed recent lab results for WBC- confirmed PCP message re: results post- office visit on 04/16/23 Durable Medical Equipment (DME) needs assessed with patient/caregiver Doctor Visits  - discussed the importance of doctor visits Communication with PCP re: successful enrollment into [facility] 30-day program, today 04/18/23  Post discharge activity limitations prescribed by provider reviewed Post-op wound/incision care reviewed with patient/caregiver Discussed current clinical condition:  "Thank you for calling me back.  I just found these antibiotics on the table, from before I went into the hospital-- and I am wondering should I have started taking them again when I got home from the hospital?  Should I start taking them now?  Okay, I will throw them out.  I have some new swelling in my ankles that I have never had before.  I did see my PCP this week, she gave me a good report.  The sore on my backside is getting better and we are keeping it very clean and dry and changing the dressing several times every day; it is much better already; yes, please do start calling me every week for the next few weeks.  I will start weighing myself at home; I would appreciate the extra calls;" she denies specific clinical concerns today Reviewed recent HFU office visit with PCP on 04/16/23- verbalizes good understanding of post-visit instructions and denies questions Thoroughly reinforced care of her sacral bedsore as per post-hospital follow up PCP office visit instructions Home Health PT services- confirmed active; encouraged patient's active participation/ engagement; reinforced role of home health services with importance of participation/ ongoing  engagement Confirmed not currently requiring/ using assistive devices/ independent in self-care activities: occasionally uses walker/ [facility] Confirmed patient is NOT currently obtaining daily weights at home: provided education / rationale for daily weight monitoring at home as earliest indicator of fluid retention, along with weight gain guidelines/ action plan for weight gain; importance of taking diuretic as prescribed- she gladly agrees to begin daily weight monitoring at home starting tomorrow morning Confirmed patient continues monitoring blood sugars at home-- reports "they are okay;" she is unable to review today: home health PT has arrived at her home at time of [facility] call completion TOC 30-day program initial assessment initiated today  Patient Goals/Self-Care Activities: Participate in Transition of Care Program/Attend TOC scheduled calls Take all medications as prescribed Attend all scheduled provider appointments Call provider office for new concerns or questions  Weigh yourself every day to stay on top of early fluid retention: write down your weights every day so you remember what it is from day to day: follow the weight-gain guidelines and action plan to call your doctor if you gain more than 3 lbs overnight, or 5 lbs in one week Continue monitoring and recording your blood sugars at home Continue working with the home health team that is involved in your care Continue caring for the sore on your bottom exactly like the doctor instructed you to  Follow Up Plan:  Telephone follow up appointment with care management team member scheduled for:  Thursday 04/26/23 at 1:00 pm    Plan for next  week's call: Review medications: full review Review daily weights since Thursday 04/19/23 Review home health PT visits Review oncology provider office visit/ WBC from 04/23/23 Reinforce education re: self health management of CHF/ daily weights Progression of TOC 30-day program initial assessment        Patient verbalizes understanding of instructions and care plan provided today and agrees to view in MyChart. Active MyChart status and patient understanding of how to access instructions and care plan via MyChart confirmed with patient.     if you are experiencing a Mental Health or Behavioral Health Crisis or need someone to talk to, please  call the Suicide and Crisis Lifeline: 988 call the [place] National Suicide Prevention Lifeline: 5184454705 or TTY: (905)048-0344 TTY 718 183 7610) to talk to a trained counselor call 1-800-273-TALK (toll free, 24 hour hotline) go to [facility] [address], [place] [phone]) call the [facility] Crisis Line: [phone] call 911    Caryl Pina, RN, BSN, CCRN Alumnus RN Care Manager  Transitions of Care  [facility] - Population Health  [place] [phone]: direct office

## 2023-04-18 NOTE — Telephone Encounter (Signed)
 Copied from CRM [id number]. Topic: Clinical - Home Health Verbal Orders >> Apr 18, 2023  4:37 PM Mayer Masker wrote: Caller/Agency: [facility]  Callback Number: [phone] Service Requested: Physical Therapy/ Skilled Nursing  Frequency: 2 times a week for 2 weeks  and 1 time a week for 1 week (off week with chemo) Any new concerns about the patient? No

## 2023-04-18 NOTE — Assessment & Plan Note (Signed)
 Cdiff is positive. She is prescribed Vancomycin 125 mg qid and she needs to start a probiotic. Increase fluids

## 2023-04-19 ENCOUNTER — Encounter: Payer: Self-pay | Admitting: *Deleted

## 2023-04-19 ENCOUNTER — Telehealth: Payer: Self-pay | Admitting: *Deleted

## 2023-04-19 ENCOUNTER — Telehealth: Payer: Self-pay

## 2023-04-19 NOTE — Progress Notes (Signed)
 Patient is engaged with [facility] TOC 30 day program and does not request additional engagement at this time. Please re-refer should patient be interested in CCM services in the future.  [facility]  [facility], [facility] Care Management Assistant Direct Dial: [phone]  Fax: [phone]

## 2023-04-19 NOTE — Progress Notes (Unsigned)
 Received MyChart message stating that patient had tested positive for c-diff. Her appointment had already been pushed back one week due to hospitalization. Spoke to Dr Myna Hidalgo and he would like treatment pushed back an additional one week to the week of March 31st.   Message sent to patient and scheduling.   Oncology Nurse Navigator Documentation     04/19/2023    9:30 AM  Oncology Nurse Navigator Flowsheets  Navigator Follow Up Date: 04/30/2023  Navigator Follow Up Reason: Follow-up Appointment;Chemotherapy  Financial risk analyst Encounter Type MyChart  Patient Visit Type MedOnc  Treatment Phase Active Tx  Barriers/Navigation Needs Coordination of Care;Education  Education Other  Interventions Coordination of Care;Education  Acuity Level 2-Minimal Needs (1-2 Barriers Identified)  Coordination of Care Appts  Education Method Written  Support Groups/Services Friends and Family  Time Spent with Patient 15

## 2023-04-19 NOTE — Telephone Encounter (Signed)
 Called Nynica back at [facility]. Verbal order was given for physical therapy.

## 2023-04-19 NOTE — Patient Outreach (Signed)
 Care Management  Transitions of Care Program Transitions of Care Post-discharge week 2/ day # 6   04/19/2023 Name: [name] MRN: [id number] DOB: 1945-09-28  Subjective: Amanda Crawford is a 78 y.o. year old female who is a primary care patient of Amanda Canary, MD. The Care Management team  Engaged with patient's caregiver/ daughter- she manages medications for patient--  by telephone to assess and address transitions of care needs.   Consent to Services:  Patient was given information about care management services, agreed to services, and gave verbal consent to participate.   Assessment:  Care Coordination outreach per PCP request, with patient's daughter re: positive C-Difficile result and new orders/ instructions- as per care plan entry below          SDOH Interventions    Flowsheet Row Telephone from 04/13/2023 in [place] POPULATION HEALTH DEPARTMENT Clinical Support from 07/05/2020 in [facility] at [facility]  SDOH Interventions    Food Insecurity Interventions Intervention Not Indicated --  Housing Interventions Intervention Not Indicated --  Transportation Interventions Intervention Not Indicated  [family provides transportation] --  Utilities Interventions Intervention Not Indicated --  Physical Activity Interventions -- Intervention Not Indicated  [pt has set a goal to increase activity]        Goals Addressed             This Visit's Progress    TOC 30-day Program Care Plan   On track    Current Barriers:  Medication management daughter now assisting with medication management; patient verbalizes good general understanding of current medication regimen Provider appointments confirmed has attended hospital follow up office visit with PCP on 04/16/23 Home Health services confirmed active with [facility] 04/18/23: Patient called me back after initial TOC call placed on 04/13/23: she has questions about a medication: she had found  an old antibiotic bottle at her home, from prior to her recent hospital admission: she is asking if she should have taken this, and also asking if she should start taking it now; reviewed post-hospital discharge instructions: confirmed that patient was instructed to stop taking the antibiotic at time of hospital discharge- patient appreciative of the follow up information Given patient's call-back post- initial TOC call on 04/13/23:  Successfully enrolled into 30-day TOC program today 04/18/23- as week # 2 call Recent hospital admission: 3/9-13/2025: shock secondary to recurrent diverticulitis/ COVID-19; hypoglycemia/ hypotension Independent in self-care; resides with supportive spouse; has local adult children who are also very involved and supportive- adult daughter has initiated helping patient manage medications post-recent hospital discharge- patient was previously independent in medication management Sacral bedsore- unknown cause 04/18/23: reports that he ankles have been swollen "for the last couple of days;" states she held only one day of the diuretic as advised post-hospital discharge: but "for some reason, my ankles are now puffy;" reports, "I take the fluid pill every day and they have never been swollen before;"   Discussed possibility of IV fluid from hospital discharge causing this to show up late post-discharge Confirmed that patient does not monitor or record daily weights prior to this admission: "I was never told to, and my ankles never did swell before"  RNCM Clinical Goal(s):  Patient will work with the Care Management team over the next 30 days to address Transition of Care Barriers: Medication Management Provider appointments Home Health services attend all scheduled medical appointments: oncology office visits next week: 3/24; 3/25; 3/26; 3/28 as evidenced by review of  same with patient during weekly TOC 30-day program outreaches with RN CM not experience hospital admission as  evidenced by review of EMR. Hospital Admissions in last 6 months = 1  through collaboration with RN Care manager, provider, and care team.   Interventions: Evaluation of current treatment plan related to  self management and patient's adherence to plan as established by provider  Transitions of Care:  Goal on track:  Yes. 04/19/23: care coordination outreach post- TOC 30- day program call yesterday  04/19/23: Received request from PCP this morning to contact patient and update her that her C-diff resulted positive; PCP requested that I review new orders as per her message from 04/18/23 note Initial outreach to both patient and daughter unsuccessful- left messages requesting call-back 11:30 am: daughter returned my call Updated daughter that C-Diff resulted positive: daughter was already aware; reports patient's husband is in route now to pick up Vancomycin- confirms ready at outpatient pharmacy Discussed options around which probiotic to use- the one patient is already taking, or obtain a new probiotic- daughter has already requested clarification via MyChart with PCP: I also recommended that daughter may wish to ask pharmacy team at outpatient pharmacy- she will consider Provided education/ reinforcement around C-Diff infection/ purpose of and benefits of pro-biotic in setting of prolonged antibiotic therapy; need to ensure strict infection control measures at home Confirmed with patient's daughter that patient is currently taking Lasix every day as per current Rx on medication list- "has been taking daily for quite some time now"- advised her to continue taking every day for now until I hear back from PCP with clarifying instructions- she is agreeable to this Reinforced recommendations to have patient elevate her feet above heart level for 15 minutes tid: daughter states she has asked patient to do this- is unsure if patient is doing it tid, but reports "I know she is at least doing it sometimes during  the day" Confirmed daughter is aware that oncology visits have further been pushed back, per review of EHR notes- due to new diagnosis of C-Diff 2:30 pm:  Received clarification from PCP re: Lasix dosing from previous message: PCP clarified that patient should continue taking Lasix as currently dosed every day-- (I.e.- do not start taking every-other-day, as previously instructed): PCP added that if LE swelling persists, they can increase the regular dose by 1/2 and give for up to 3 days Re- contacted daughter and updated her on these new instructions/ clarification- and daughter verbalizes excellent understanding of same  From 04/18/23: Labs reviewed recent lab results for WBC- confirmed PCP message re: results post- office visit on 04/16/23 Durable Medical Equipment (DME) needs assessed with patient/caregiver Doctor Visits  - discussed the importance of doctor visits Communication with PCP re: successful enrollment into [facility] 30-day program, today 04/18/23  Post discharge activity limitations prescribed by provider reviewed Post-op wound/incision care reviewed with patient/caregiver Discussed current clinical condition:  "Thank you for calling me back.  I just found these antibiotics on the table, from before I went into the hospital-- and I am wondering should I have started taking them again when I got home from the hospital?  Should I start taking them now?  Okay, I will throw them out.  I have some new swelling in my ankles that I have never had before.  I did see my PCP this week, she gave me a good report.  The sore on my backside is getting better and we are keeping it very clean and dry  and changing the dressing several times every day; it is much better already; yes, please do start calling me every week for the next few weeks.  I will start weighing myself at home; I would appreciate the extra calls;" she denies specific clinical concerns today Reviewed recent HFU office visit with PCP on  04/16/23- verbalizes good understanding of post-visit instructions and denies questions Thoroughly reinforced care of her sacral bedsore as per post-hospital follow up PCP office visit instructions Home Health PT services- confirmed active; encouraged patient's active participation/ engagement; reinforced role of home health services with importance of participation/ ongoing engagement Confirmed not currently requiring/ using assistive devices/ independent in self-care activities: occasionally uses walker/ BSC Confirmed patient is NOT currently obtaining daily weights at home: provided education / rationale for daily weight monitoring at home as earliest indicator of fluid retention, along with weight gain guidelines/ action plan for weight gain; importance of taking diuretic as prescribed- she gladly agrees to begin daily weight monitoring at home starting tomorrow morning Confirmed patient continues monitoring blood sugars at home-- reports "they are okay;" she is unable to review today: home health PT has arrived at her home at time of [facility] call completion TOC 30-day program initial assessment initiated today  Patient Goals/Self-Care Activities: Participate in Transition of Care Program/Attend TOC scheduled calls Take all medications as prescribed Attend all scheduled provider appointments Call provider office for new concerns or questions  Weigh yourself every day to stay on top of early fluid retention: write down your weights every day so you remember what it is from day to day: follow the weight-gain guidelines and action plan to call your doctor if you gain more than 3 lbs overnight, or 5 lbs in one week Continue monitoring and recording your blood sugars at home Continue working with the home health team that is involved in your care Continue caring for the sore on your bottom exactly like the doctor instructed you to   Follow Up Plan:  Telephone follow up appointment with care management  team member scheduled for:  Thursday 04/26/23 at 1:00 pm    Plan for next week's call: Review medications: full review Review daily weights since Thursday 04/19/23 Review home health PT visits Review oncology provider office visit/ WBC from 04/23/23 Reinforce education re: self health management of CHF/ daily weights Progression of TOC 30-day program initial assessment       Plan: Telephone follow up appointment with care management team member scheduled for:   Thursday 04/26/23 at 1:00 pm    Total time spent from review to signing of note/ including any care coordination interventions:  73 minutes: multiple outreaches with PCP and patient's daughter for care coordination, per PCP request  Pls call/ message for questions,  Caryl Pina, RN, BSN, Media planner  Transitions of Care  [facility] - [facility] [phone]: direct office

## 2023-04-20 ENCOUNTER — Inpatient Hospital Stay: Payer: No Typology Code available for payment source

## 2023-04-20 ENCOUNTER — Encounter: Payer: Self-pay | Admitting: Emergency Medicine

## 2023-04-20 ENCOUNTER — Other Ambulatory Visit: Payer: Self-pay

## 2023-04-20 ENCOUNTER — Encounter: Payer: Self-pay | Admitting: Hematology & Oncology

## 2023-04-21 ENCOUNTER — Other Ambulatory Visit: Payer: Self-pay | Admitting: Family Medicine

## 2023-04-21 ENCOUNTER — Other Ambulatory Visit: Payer: Self-pay | Admitting: Pulmonary Disease

## 2023-04-23 ENCOUNTER — Inpatient Hospital Stay

## 2023-04-23 ENCOUNTER — Inpatient Hospital Stay: Admitting: Hematology & Oncology

## 2023-04-23 LAB — CDIFF NAA+O+P+STOOL CULTURE
E coli, Shiga toxin Assay: NEGATIVE
Toxigenic C. Difficile by PCR: POSITIVE — AB
Toxigenic C. Difficile by PCR: POSITIVE — AB

## 2023-04-23 NOTE — Telephone Encounter (Signed)
 Please advise controlled substance refill request. Pt was last seen on 03-01-23 by Kandice Robinsons, NP.

## 2023-04-24 ENCOUNTER — Telehealth: Payer: Self-pay | Admitting: Dietician

## 2023-04-24 ENCOUNTER — Inpatient Hospital Stay: Payer: No Typology Code available for payment source | Attending: Hematology & Oncology | Admitting: Dietician

## 2023-04-24 ENCOUNTER — Inpatient Hospital Stay

## 2023-04-24 NOTE — Telephone Encounter (Signed)
Attempted to reach patient for a scheduled remote nutrition consult. Provided my cell# on voice mail to return call for her follow up nutrition consult.  Cyndi Helder Crisafulli, RDN, LDN Registered Dietitian, [facility] Part Time Remote (Usual office hours: Tuesday-Thursday) Cell: [phone]    

## 2023-04-25 ENCOUNTER — Inpatient Hospital Stay

## 2023-04-26 ENCOUNTER — Other Ambulatory Visit: Payer: Self-pay | Admitting: *Deleted

## 2023-04-26 NOTE — Patient Outreach (Signed)
 Care Management  Transitions of Care Program Transitions of Care Post-discharge week 3/ day # 13   04/26/2023 Name: Amanda Crawford MRN: [id number] DOB: 1945-10-25  Subjective: Amanda Crawford is a 78 y.o. year old female who is a primary care patient of [name], MD. The Care Management team Engaged with patient by telephone to assess and address transitions of care needs.   Consent to Services:  Patient was given information about care management services, agreed to services, and gave verbal consent to participate.   Enrolled into TOC 30-day program:  initial TOC call 04/13/23: enrolled on week # 2:  04/18/23- (initially declined on first TOC call, called back 5 days later and then enrolled)  Assessment: "I am doing better than I was last week; the swelling in my ankles has completely gone away after I started taking the lasix again after the hospital visit; the sore on my bottom seems better, but I was wondering if Dr. Rogelia Rohrer thinks I should have a nurse come in to check it?  Right now, I only have the PT coming in.  My husband is taking care of the sore, and he says it looks better and I know it sure doesn't hurt as bad.  I am taking the antibiotic and the probiotic and all of these medicines like they told me to.  I start my chemo back up next week, that is how I will be celebrating my birthday"    Denies clinical concerns and sounds to be in no distress throughout [facility] 30-day program outreach call today          SDOH Interventions    Flowsheet Row Telephone from 04/13/2023 in [place] POPULATION HEALTH DEPARTMENT Clinical Support from 07/05/2020 in [facility] Primary Care at [facility]  SDOH Interventions    Food Insecurity Interventions Intervention Not Indicated --  Housing Interventions Intervention Not Indicated --  Transportation Interventions Intervention Not Indicated  [family provides transportation] --  Utilities Interventions Intervention Not Indicated  --  Physical Activity Interventions -- Intervention Not Indicated  [pt has set a goal to increase activity]       Goals Addressed             This Visit's Progress    TOC 30-day Program Care Plan   On track    Current Barriers:  Medication management daughter now assisting with medication management; patient verbalizes good general understanding of current medication regimen Provider appointments confirmed has attended hospital follow up office visit with PCP on 04/16/23 Home Health services confirmed active with [facility] 04/18/23: Patient called me back after initial TOC call placed on 04/13/23: she has questions about a medication: she had found an old antibiotic bottle at her home, from prior to her recent hospital admission: she is asking if she should have taken this, and also asking if she should start taking it now; reviewed post-hospital discharge instructions: confirmed that patient was instructed to stop taking the antibiotic at time of hospital discharge- patient appreciative of the follow up information Given patient's call-back post- initial TOC call on 04/13/23:  Successfully enrolled into 30-day TOC program today 04/18/23- as week # 2 call Recent hospital admission: 3/9-13/2025: shock secondary to recurrent diverticulitis/ COVID-19; hypoglycemia/ hypotension Independent in self-care; resides with supportive spouse; has local adult children who are also very involved and supportive- adult daughter has initiated helping patient manage medications post-recent hospital discharge- patient was previously independent in medication management Sacral bedsore- unknown cause 04/18/23:  reports that he ankles have been swollen "for the last couple of days;" states she held only one day of the diuretic as advised post-hospital discharge: but "for some reason, my ankles are now puffy;" reports, "I take the fluid pill every day and they have never been swollen before;"   Discussed possibility of IV  fluid from hospital discharge causing this to show up late post-discharge Confirmed that patient does not monitor or record daily weights prior to this admission: "I was never told to, and my ankles never did swell before" 04/26/23: confirms LE edema "is much better;" confirms she has started daily weights at home: see interventions section 04/19/23:  Stool sample resulted positive for C-Diff- care coordination outreach with PCP and patient completed, as per PCP request: see note from 04/19/23   RNCM Clinical Goal(s):  Patient will work with the Care Management team over the next 30 days to address Transition of Care Barriers: Medication Management Provider appointments Home Health services attend all scheduled medical appointments: oncology office visits next week: 3/24; 3/25; 3/26; 3/28 as evidenced by review of same with patient during weekly TOC 30-day program outreaches with RN CM not experience hospital admission as evidenced by review of EMR. Hospital Admissions in last 6 months = 1  through collaboration with RN Care manager, provider, and care team.   Interventions: Evaluation of current treatment plan related to  self management and patient's adherence to plan as established by provider  Transitions of Care:  Goal on track:  Yes. 04/26/23 Durable Medical Equipment (DME) needs assessed with patient/caregiver Doctor Visits  - discussed the importance of doctor visits Communication with PCP re: patient's question regarding possibility of adding RN for wound care to current home health orders; confirmed patient has continued having home visits from [facility] [place]: [phone] Post discharge activity limitations prescribed by provider reviewed Post-op wound/incision care reviewed with patient/caregiver Discussed current clinical condition:  "I am doing better than I was last week; the swelling in my ankles has completely gone away after I started taking the lasix again after the  hospital visit; the sore on my bottom seems better, but I was wondering if Dr. Rogelia Rohrer thinks I should have a nurse come in to check it?  Right now, I only have the PT coming in.  My husband is taking care of the sore, and he says it looks better and I know it sure doesn't hurt as bad.  I am taking the antibiotic and the probiotic and all of these medicines like they told me to.  I start my chemo back up next week, that is how I will be celebrating my birthday;"  Denies clinical concerns and sounds to be in no distress throughout [facility] 30-day program outreach call today Full medication reconciliation/ review completed; no concerns or discrepancies identified; confirmed patient obtained/ is taking all newly/ recently Rx'd medications as instructed; self-manages medications and denies questions/ concerns around medications today: acknowledges that her daughter is currently assisting, "checking behind me" around medication preparation in weekly pill box: patient verbalizes good general understanding of the purpose/ dosing/ scheduling of her medications  Thoroughly reinforced care of her sacral bedsore as per post-hospital follow up PCP office visit instructions Home Health PT services- confirmed active; encouraged patient's active participation/ engagement; reinforced role of home health services with importance of participation/ ongoing engagement: care coordination outreach with PCP to ask patient's question re: possibility of home health RN for wound assessment/ treatment- care Confirmed still not currently requiring/  using assistive devices on a regular basis/ independent in self-care activities: occasionally uses walker/ BSC Confirmed patient has started and is currently now obtaining daily weights at home: reviewed recent weights from home monitoring with patient: she reports consistent weights between 206-207 lbs and denies weight gain > 3 lbs overnight/ 5 lbs in one week Reinforced previously provided  education / rationale for daily weight monitoring at home as earliest indicator of fluid retention, along with weight gain guidelines/ action plan for weight gain; importance of taking diuretic as prescribed- positive reinforcement provided with encouragement to continue efforts TOC 30-day program initial assessment progression today with full medication review  Patient Goals/Self-Care Activities: Participate in Transition of Care Program/Attend TOC scheduled calls Take all medications as prescribed Attend all scheduled provider appointments Call provider office for new concerns or questions  Weigh yourself every day to stay on top of early fluid retention: write down your weights every day so you remember what it is from day to day: follow the weight-gain guidelines and action plan to call your doctor if you gain more than 3 lbs overnight, or 5 lbs in one week Continue monitoring and recording your blood sugars at home Continue working with the home health team that is involved in your care Continue caring for the sore on your bottom exactly like the doctor instructed you to  Follow Up Plan:  Telephone follow up appointment with care management team member scheduled for:  Thursday 05/03/23 at 1:00 pm    Plan for next week's call: Review daily weights since Thursday 04/26/23 Review home health PT visits- follow up to see if home health nurse added for wound care/ assessment Patient report of wound (sacral) assessment Review oncology provider office visits for re-start of chemo/ WBC from 04/30/23; 05/01/23; 05/02/23: follow up to ensure patient understands/ received update on decadron administration post- chemotherapy sessions Ensure antibiotics completed as prescribed Reinforce education re: self health management of CHF/ daily weights Progression of TOC 30-day program initial assessment       Total time spent from review to signing of note/ including any care coordination interventions:   95 minutes- extensive medication review; patient requires extra time; has numerous questions during visit  Plan: Telephone follow up appointment with care management team member scheduled for:   Thursday 05/03/23 at 1:00 pm  Pls call/ message for questions,  Caryl Pina, RN, BSN, CCRN Alumnus RN Care Manager  Transitions of Care  [facility] - [facility] [phone]: direct office

## 2023-04-27 ENCOUNTER — Inpatient Hospital Stay

## 2023-04-30 ENCOUNTER — Inpatient Hospital Stay

## 2023-04-30 ENCOUNTER — Encounter: Payer: Self-pay | Admitting: Hematology & Oncology

## 2023-04-30 ENCOUNTER — Inpatient Hospital Stay (HOSPITAL_BASED_OUTPATIENT_CLINIC_OR_DEPARTMENT_OTHER): Admitting: Hematology & Oncology

## 2023-04-30 ENCOUNTER — Other Ambulatory Visit: Payer: Self-pay

## 2023-04-30 ENCOUNTER — Inpatient Hospital Stay: Attending: Hematology & Oncology

## 2023-04-30 VITALS — BP 105/55 | HR 97 | Temp 98.2°F | Resp 19 | Ht 64.0 in | Wt 209.0 lb

## 2023-04-30 VITALS — BP 120/57 | HR 97

## 2023-04-30 DIAGNOSIS — Z79620 Long term (current) use of immunosuppressive biologic: Secondary | ICD-10-CM | POA: Diagnosis not present

## 2023-04-30 DIAGNOSIS — Z79630 Long term (current) use of alkylating agent: Secondary | ICD-10-CM | POA: Insufficient documentation

## 2023-04-30 DIAGNOSIS — C342 Malignant neoplasm of middle lobe, bronchus or lung: Secondary | ICD-10-CM

## 2023-04-30 DIAGNOSIS — R918 Other nonspecific abnormal finding of lung field: Secondary | ICD-10-CM

## 2023-04-30 DIAGNOSIS — Z5111 Encounter for antineoplastic chemotherapy: Secondary | ICD-10-CM | POA: Insufficient documentation

## 2023-04-30 DIAGNOSIS — Z5112 Encounter for antineoplastic immunotherapy: Secondary | ICD-10-CM | POA: Insufficient documentation

## 2023-04-30 DIAGNOSIS — C642 Malignant neoplasm of left kidney, except renal pelvis: Secondary | ICD-10-CM

## 2023-04-30 DIAGNOSIS — C651 Malignant neoplasm of right renal pelvis: Secondary | ICD-10-CM

## 2023-04-30 DIAGNOSIS — Z7952 Long term (current) use of systemic steroids: Secondary | ICD-10-CM | POA: Diagnosis not present

## 2023-04-30 DIAGNOSIS — Z79899 Other long term (current) drug therapy: Secondary | ICD-10-CM | POA: Insufficient documentation

## 2023-04-30 DIAGNOSIS — D649 Anemia, unspecified: Secondary | ICD-10-CM

## 2023-04-30 DIAGNOSIS — D4989 Neoplasm of unspecified behavior of other specified sites: Secondary | ICD-10-CM

## 2023-04-30 DIAGNOSIS — Z8701 Personal history of pneumonia (recurrent): Secondary | ICD-10-CM | POA: Insufficient documentation

## 2023-04-30 DIAGNOSIS — C649 Malignant neoplasm of unspecified kidney, except renal pelvis: Secondary | ICD-10-CM | POA: Insufficient documentation

## 2023-04-30 DIAGNOSIS — C3490 Malignant neoplasm of unspecified part of unspecified bronchus or lung: Secondary | ICD-10-CM | POA: Diagnosis present

## 2023-04-30 DIAGNOSIS — Z79634 Long term (current) use of topoisomerase inhibitor: Secondary | ICD-10-CM | POA: Insufficient documentation

## 2023-04-30 DIAGNOSIS — Z905 Acquired absence of kidney: Secondary | ICD-10-CM | POA: Diagnosis not present

## 2023-04-30 LAB — CMP (CANCER CENTER ONLY)
ALT: 36 U/L (ref 0–44)
AST: 49 U/L — ABNORMAL HIGH (ref 15–41)
Albumin: 3.5 g/dL (ref 3.5–5.0)
Alkaline Phosphatase: 118 U/L (ref 38–126)
Anion gap: 9 (ref 5–15)
BUN: 22 mg/dL (ref 8–23)
CO2: 26 mmol/L (ref 22–32)
Calcium: 9.7 mg/dL (ref 8.9–10.3)
Chloride: 106 mmol/L (ref 98–111)
Creatinine: 1.53 mg/dL — ABNORMAL HIGH (ref 0.44–1.00)
GFR, Estimated: 34 mL/min — ABNORMAL LOW (ref >60)
Glucose, Bld: 143 mg/dL — ABNORMAL HIGH (ref 70–99)
Potassium: 4.1 mmol/L (ref 3.5–5.1)
Sodium: 141 mmol/L (ref 135–145)
Total Bilirubin: 0.7 mg/dL (ref 0.0–1.2)
Total Protein: 6.5 g/dL (ref 6.5–8.1)

## 2023-04-30 LAB — CBC WITH DIFFERENTIAL (CANCER CENTER ONLY)
Abs Immature Granulocytes: 0.06 10*3/uL (ref 0.00–0.07)
Basophils Absolute: 0.1 10*3/uL (ref 0.0–0.1)
Basophils Relative: 1 %
Eosinophils Absolute: 0.4 10*3/uL (ref 0.0–0.5)
Eosinophils Relative: 5 %
HCT: 30.4 % — ABNORMAL LOW (ref 36.0–46.0)
Hemoglobin: 9.9 g/dL — ABNORMAL LOW (ref 12.0–15.0)
Immature Granulocytes: 1 %
Lymphocytes Relative: 22 %
Lymphs Abs: 1.8 10*3/uL (ref 0.7–4.0)
MCH: 34 pg (ref 26.0–34.0)
MCHC: 32.6 g/dL (ref 30.0–36.0)
MCV: 104.5 fL — ABNORMAL HIGH (ref 80.0–100.0)
Monocytes Absolute: 0.6 10*3/uL (ref 0.1–1.0)
Monocytes Relative: 7 %
Neutro Abs: 5.4 10*3/uL (ref 1.7–7.7)
Neutrophils Relative %: 64 %
Platelet Count: 171 10*3/uL (ref 150–400)
RBC: 2.91 MIL/uL — ABNORMAL LOW (ref 3.87–5.11)
RDW: 16.8 % — ABNORMAL HIGH (ref 11.5–15.5)
WBC Count: 8.3 10*3/uL (ref 4.0–10.5)
nRBC: 0 % (ref 0.0–0.2)

## 2023-04-30 LAB — LACTATE DEHYDROGENASE: LDH: 207 U/L — ABNORMAL HIGH (ref 98–192)

## 2023-04-30 LAB — SAMPLE TO BLOOD BANK

## 2023-04-30 LAB — MAGNESIUM: Magnesium: 1.8 mg/dL (ref 1.7–2.4)

## 2023-04-30 MED ORDER — SODIUM CHLORIDE 0.9 % IV SOLN
80.0000 mg/m2 | Freq: Once | INTRAVENOUS | Status: AC
  Administered 2023-04-30: 168 mg via INTRAVENOUS
  Filled 2023-04-30: qty 8.4

## 2023-04-30 MED ORDER — DURVALUMAB 500 MG/10ML IV SOLN
1500.0000 mg | Freq: Once | INTRAVENOUS | Status: AC
  Administered 2023-04-30: 1500 mg via INTRAVENOUS
  Filled 2023-04-30: qty 30

## 2023-04-30 MED ORDER — SULFAMETHOXAZOLE-TRIMETHOPRIM 800-160 MG PO TABS: 1.0000 | ORAL_TABLET | Freq: Every day | ORAL | 5 refills | Status: DC

## 2023-04-30 MED ORDER — SODIUM CHLORIDE 0.9 % IV SOLN
283.6000 mg | Freq: Once | INTRAVENOUS | Status: AC
  Administered 2023-04-30: 280 mg via INTRAVENOUS
  Filled 2023-04-30: qty 28

## 2023-04-30 MED ORDER — SODIUM CHLORIDE 0.9 % IV SOLN
150.0000 mg | Freq: Once | INTRAVENOUS | Status: AC
  Administered 2023-04-30: 150 mg via INTRAVENOUS
  Filled 2023-04-30: qty 150

## 2023-04-30 MED ORDER — HEPARIN SOD (PORK) LOCK FLUSH 100 UNIT/ML IV SOLN
500.0000 [IU] | Freq: Once | INTRAVENOUS | Status: AC | PRN
  Administered 2023-04-30: 500 [IU]

## 2023-04-30 MED ORDER — DEXAMETHASONE SODIUM PHOSPHATE 10 MG/ML IJ SOLN
10.0000 mg | Freq: Once | INTRAMUSCULAR | Status: AC
Start: 2023-04-30 — End: 2023-04-30
  Administered 2023-04-30: 10 mg via INTRAVENOUS
  Filled 2023-04-30: qty 1

## 2023-04-30 MED ORDER — SODIUM CHLORIDE 0.9 % IV SOLN: INTRAVENOUS | Status: DC

## 2023-04-30 MED ORDER — SODIUM CHLORIDE 0.9% FLUSH
10.0000 mL | INTRAVENOUS | Status: DC | PRN
  Administered 2023-04-30: 10 mL

## 2023-04-30 MED ORDER — PALONOSETRON HCL INJECTION 0.25 MG/5ML
0.2500 mg | Freq: Once | INTRAVENOUS | Status: AC
Start: 2023-04-30 — End: 2023-04-30
  Administered 2023-04-30: 0.25 mg via INTRAVENOUS
  Filled 2023-04-30: qty 5

## 2023-04-30 NOTE — Patient Instructions (Signed)

## 2023-04-30 NOTE — Patient Instructions (Signed)
 [facility] - A DEPT OF [facility]  Discharge Instructions: Thank you for choosing [facility] to provide your oncology and hematology care.   If you have a lab appointment with the Cancer Center, please go directly to the Cancer Center and check in at the registration area.  Wear comfortable clothing and clothing appropriate for easy access to any Portacath or PICC line.   We strive to give you quality time with your provider. You may need to reschedule your appointment if you arrive late (15 or more minutes).  Arriving late affects you and other patients whose appointments are after yours.  Also, if you miss three or more appointments without notifying the office, you may be dismissed from the clinic at the provider's discretion.      For prescription refill requests, have your pharmacy contact our office and allow 72 hours for refills to be completed.    Today you received the following chemotherapy and/or immunotherapy agents Durvalumab/Carbo/VP16      To help prevent nausea and vomiting after your treatment, we encourage you to take your nausea medication as directed.  BELOW ARE SYMPTOMS THAT SHOULD BE REPORTED IMMEDIATELY: *FEVER GREATER THAN 100.4 F (38 C) OR HIGHER *CHILLS OR SWEATING *NAUSEA AND VOMITING THAT IS NOT CONTROLLED WITH YOUR NAUSEA MEDICATION *UNUSUAL SHORTNESS OF BREATH *UNUSUAL BRUISING OR BLEEDING *URINARY PROBLEMS (pain or burning when urinating, or frequent urination) *BOWEL PROBLEMS (unusual diarrhea, constipation, pain near the anus) TENDERNESS IN MOUTH AND THROAT WITH OR WITHOUT PRESENCE OF ULCERS (sore throat, sores in mouth, or a toothache) UNUSUAL RASH, SWELLING OR PAIN  UNUSUAL VAGINAL DISCHARGE OR ITCHING   Items with * indicate a potential emergency and should be followed up as soon as possible or go to the Emergency Department if any problems should occur.  Please show the CHEMOTHERAPY ALERT CARD or  IMMUNOTHERAPY ALERT CARD at check-in to the Emergency Department and triage nurse. Should you have questions after your visit or need to cancel or reschedule your appointment, please contact [facility] - A DEPT OF [facility]  [phone] and follow the prompts.  Office hours are 8:00 a.m. to 4:30 p.m. Monday - Friday. Please note that voicemails left after 4:00 p.m. may not be returned until the following business day.  We are closed weekends and major holidays. You have access to a nurse at all times for urgent questions. Please call the main number to the clinic [phone] and follow the prompts.  For any non-urgent questions, you may also contact your provider using MyChart. We now offer e-Visits for anyone 63 and older to request care online for non-urgent symptoms. For details visit [email or web address].   Also download the MyChart app! Go to the app store, search "MyChart", open the app, select [facility], and log in with your MyChart username and password.

## 2023-04-30 NOTE — Progress Notes (Signed)
 Pt. has a birthday tomorrow and in good spirit accompanied by husband. Reviewed lab with patient and MD notified of Creatinine 1.53, ok to treat. Encourage fluid intake. Consent signed for Durvalumab today.

## 2023-04-30 NOTE — Progress Notes (Signed)
 SCr = 1.53.  Okay to proceed with treatment per Dr. Myna Hidalgo.

## 2023-04-30 NOTE — Progress Notes (Signed)
 Hematology and Oncology Follow Up Visit  ANNELI BING [id number] September 01, 1945 78 y.o. 04/30/2023   Principle Diagnosis:  Small cell lung cancer -- Liane Comber stage Renal cell carcinoma -- Clear cell  Current Therapy:   S/p LEFT nephrectomy - 2001 Carbo/VP-16/Durvalumab -- s/p cycle #1- start on 03/26/2023     Interim History:  Ms. Fels is back for follow-up.  She had little bit of a tough time with the first cycle of chemotherapy.  She she was admitted to the hospital.  She was not neutropenic.  She had pneumonia.  We will have to make sure that we get her on some antibiotics to help prevent this from happening again.  She is feeling a whole lot better now.  She was having some diarrhea.  This seems to have gotten better.  Her appetite is doing better.  There has been no bleeding.  She has had no nausea or vomiting.  She has had no mouth sores.  Currently, I would have to say that her performance status is ECOG 1.  Medications:  Current Outpatient Medications:    vancomycin (VANCOCIN) 125 MG capsule, Take 125 mg by mouth 4 (four) times daily., Disp: , Rfl:    acidophilus (RISAQUAD) CAPS capsule, Take 1 capsule by mouth daily., Disp: 30 capsule, Rfl: 0   allopurinol (ZYLOPRIM) 300 MG tablet, TAKE 1 TABLET BY MOUTH EVERY DAY, Disp: 90 tablet, Rfl: 1   aspirin EC 81 MG tablet, Take 81 mg by mouth daily., Disp: , Rfl:    atorvastatin (LIPITOR) 20 MG tablet, Take 1 tablet (20 mg total) by mouth daily., Disp: 90 tablet, Rfl: 3   carvedilol (COREG) 3.125 MG tablet, Take 0.5 tablets (1.5625 mg total) by mouth 2 (two) times daily with a meal., Disp: , Rfl:    dexamethasone (DECADRON) 4 MG tablet, Take 2 tablets daily for 2 days, on days 4 and 5.Take with food. Every 21 days., Disp: 30 tablet, Rfl: 1   diphenhydramine-acetaminophen (TYLENOL PM) 25-500 MG TABS, Take 1 tablet by mouth at bedtime as needed (sleep)., Disp: , Rfl:    ferrous sulfate 325 (65 FE) MG tablet, Take 325 mg by mouth at  bedtime., Disp: , Rfl:    furosemide (LASIX) 40 MG tablet, TAKE 1 TABLET BY MOUTH EVERY DAY, Disp: 90 tablet, Rfl: 1   glimepiride (AMARYL) 2 MG tablet, TAKE 1 TABLET BY MOUTH EVERY DAY WITH BREAKFAST, Disp: 90 tablet, Rfl: 1   glucose blood (ONETOUCH VERIO) test strip, Check Blood Sugar 3 times daily., Disp: 100 strip, Rfl: 4   hyoscyamine (LEVSIN SL) 0.125 MG SL tablet, Place 1 tablet (0.125 mg total) under the tongue every 6 (six) hours as needed., Disp: 30 tablet, Rfl: 1   insulin glargine (LANTUS) 100 unit/mL SOPN, Inject 14 Units into the skin at bedtime. 04/26/23: Reports during TOC 30-day program call: she is currently taking this insulin 14 U at bedtime: reports has not started Semglee insulin because she still had Lantus at home to use- per PCP advice, Disp: , Rfl:    Insulin Pen Needle (BD PEN NEEDLE NANO 2ND GEN) 32G X 4 MM MISC, USE AS DIRECTED WITH LANTUS ONCE A DAY., Disp: 100 each, Rfl: 3   irbesartan (AVAPRO) 75 MG tablet, TAKE 1 TABLET BY MOUTH EVERY DAY, Disp: 90 tablet, Rfl: 3   Lancets (ONETOUCH DELICA PLUS LANCET33G) MISC, USE AS DIRECTED TO TEST DAILY, Disp: 100 each, Rfl: 12   meclizine (ANTIVERT) 12.5 MG tablet, Take 1 tablet (12.5  mg total) by mouth 3 (three) times daily as needed for dizziness. As needed for   Dizziness or nausea, Disp: 30 tablet, Rfl: 1   nystatin cream (MYCOSTATIN), Apply 1 application topically 2 (two) times daily. (Patient taking differently: Apply 1 application  topically 2 (two) times daily as needed (rash).), Disp: 30 g, Rfl: 1   ondansetron (ZOFRAN) 8 MG tablet, Take 1 tablet (8 mg total) by mouth every 8 (eight) hours as needed for nausea or vomiting. Start on third day after chemotherapy., Disp: 30 tablet, Rfl: 1   pantoprazole (PROTONIX) 40 MG tablet, Take 1 tablet (40 mg total) by mouth daily., Disp: 30 tablet, Rfl: 0   prochlorperazine (COMPAZINE) 10 MG tablet, Take 1 tablet (10 mg total) by mouth every 6 (six) hours as needed for nausea or  vomiting (Nausea or vomiting)., Disp: 30 tablet, Rfl: 1   SEMGLEE, YFGN, 100 UNIT/ML Pen, Inject 14 Units into the skin at bedtime. (Patient not taking: Reported on 04/26/2023), Disp: , Rfl:    Vitamin D, Ergocalciferol, (DRISDOL) 1.25 MG (50000 UNIT) CAPS capsule, TAKE 1 CAPSULE (50,000 UNITS TOTAL) BY MOUTH EVERY 7 (SEVEN) DAYS., Disp: 5 capsule, Rfl: 4   zolpidem (AMBIEN) 5 MG tablet, Take 1 tablet (5 mg total) by mouth at bedtime., Disp: 30 tablet, Rfl: 1  Allergies: No Known Allergies  Past Medical History, Surgical history, Social history, and Family History were reviewed and updated.  Review of Systems: Review of Systems  Constitutional: Negative.   HENT:  Negative.    Eyes: Negative.   Respiratory: Negative.    Cardiovascular: Negative.   Gastrointestinal: Negative.   Endocrine: Negative.   Genitourinary: Negative.    Musculoskeletal: Negative.   Skin: Negative.   Neurological: Negative.   Hematological: Negative.   Psychiatric/Behavioral: Negative.      Physical Exam: Vital signs show temperature of 98.2.  Pulse 97.  Blood pressure 105/55.  Weight is 209 pounds.    Wt Readings from Last 3 Encounters:  04/30/23 209 lb (94.8 kg)  04/16/23 213 lb 3.2 oz (96.7 kg)  04/12/23 229 lb 15 oz (104.3 kg)    Physical Exam Vitals reviewed.  HENT:     Head: Normocephalic and atraumatic.  Eyes:     Pupils: Pupils are equal, round, and reactive to light.  Cardiovascular:     Rate and Rhythm: Normal rate and regular rhythm.     Heart sounds: Normal heart sounds.  Pulmonary:     Effort: Pulmonary effort is normal.     Breath sounds: Normal breath sounds.     Comments: Pulmonary exam shows good breath sounds bilaterally.  I hear no wheezing.  She has good air movement bilaterally. Abdominal:     General: Bowel sounds are normal.     Palpations: Abdomen is soft.     Comments: Abdominal exam shows well-healed laparotomy scar left side of the abdomen.  There is no fluid wave.   There is no palpable liver or spleen tip.  Musculoskeletal:        General: No tenderness or deformity. Normal range of motion.     Cervical back: Normal range of motion.  Lymphadenopathy:     Cervical: No cervical adenopathy.  Skin:    General: Skin is warm and dry.     Findings: No erythema or rash.  Neurological:     Mental Status: She is alert and oriented to person, place, and time.  Psychiatric:        Behavior: Behavior normal.  Thought Content: Thought content normal.        Judgment: Judgment normal.      Lab Results  Component Value Date   WBC 8.3 04/30/2023   HGB 9.9 (L) 04/30/2023   HCT 30.4 (L) 04/30/2023   MCV 104.5 (H) 04/30/2023   PLT 171 04/30/2023     Chemistry      Component Value Date/Time   NA 141 04/30/2023 0910   NA 139 08/23/2017 0933   NA 140 11/01/2016 0818   K 4.1 04/30/2023 0910   K 4.4 11/01/2016 0818   CL 106 04/30/2023 0910   CL 107 04/16/2012 0923   CO2 26 04/30/2023 0910   CO2 26 11/01/2016 0818   BUN 22 04/30/2023 0910   BUN 24 08/23/2017 0933   BUN 17.9 11/01/2016 0818   CREATININE 1.53 (H) 04/30/2023 0910   CREATININE 1.1 11/01/2016 0818   GLU 151 (H) 09/30/2019 1014      Component Value Date/Time   CALCIUM 9.7 04/30/2023 0910   CALCIUM 10.3 11/01/2016 0818   ALKPHOS 118 04/30/2023 0910   ALKPHOS 72 11/01/2016 0818   AST 49 (H) 04/30/2023 0910   AST 75 (H) 11/01/2016 0818   ALT 36 04/30/2023 0910   ALT 75 (H) 11/01/2016 0818   BILITOT 0.7 04/30/2023 0910   BILITOT 0.78 11/01/2016 0818       Impression and Plan: Ms. Starkey is a very charming 78 year old white female.  Tom[birth year] will actually be her birthday.  She is a April Fool's baby.  We will go ahead with treatment.  I know we have held treatment for couple weeks.  Hopefully, we can keep her on schedule.  I would like to do another PET scan after this cycle.    I will plan to see her back in another 3 weeks.  We will put her on some prophylactic antibiotics  just to try to help minimize the risk of pneumonia.     Josph Macho, MD 3/31/20259:56 AM

## 2023-05-01 ENCOUNTER — Inpatient Hospital Stay: Attending: Hematology & Oncology

## 2023-05-01 ENCOUNTER — Other Ambulatory Visit: Payer: Self-pay

## 2023-05-01 ENCOUNTER — Encounter: Payer: Self-pay | Admitting: *Deleted

## 2023-05-01 VITALS — BP 127/54 | HR 98 | Temp 97.9°F | Resp 20

## 2023-05-01 DIAGNOSIS — C649 Malignant neoplasm of unspecified kidney, except renal pelvis: Secondary | ICD-10-CM | POA: Insufficient documentation

## 2023-05-01 DIAGNOSIS — Z881 Allergy status to other antibiotic agents status: Secondary | ICD-10-CM | POA: Diagnosis not present

## 2023-05-01 DIAGNOSIS — Z905 Acquired absence of kidney: Secondary | ICD-10-CM | POA: Diagnosis not present

## 2023-05-01 DIAGNOSIS — Z79899 Other long term (current) drug therapy: Secondary | ICD-10-CM | POA: Insufficient documentation

## 2023-05-01 DIAGNOSIS — Z7952 Long term (current) use of systemic steroids: Secondary | ICD-10-CM | POA: Diagnosis not present

## 2023-05-01 DIAGNOSIS — Z5189 Encounter for other specified aftercare: Secondary | ICD-10-CM | POA: Insufficient documentation

## 2023-05-01 DIAGNOSIS — Z79620 Long term (current) use of immunosuppressive biologic: Secondary | ICD-10-CM | POA: Insufficient documentation

## 2023-05-01 DIAGNOSIS — R030 Elevated blood-pressure reading, without diagnosis of hypertension: Secondary | ICD-10-CM | POA: Insufficient documentation

## 2023-05-01 DIAGNOSIS — C342 Malignant neoplasm of middle lobe, bronchus or lung: Secondary | ICD-10-CM | POA: Insufficient documentation

## 2023-05-01 DIAGNOSIS — Z79630 Long term (current) use of alkylating agent: Secondary | ICD-10-CM | POA: Insufficient documentation

## 2023-05-01 DIAGNOSIS — Z79634 Long term (current) use of topoisomerase inhibitor: Secondary | ICD-10-CM | POA: Insufficient documentation

## 2023-05-01 DIAGNOSIS — Z5111 Encounter for antineoplastic chemotherapy: Secondary | ICD-10-CM | POA: Insufficient documentation

## 2023-05-01 MED ORDER — SODIUM CHLORIDE 0.9% FLUSH
10.0000 mL | INTRAVENOUS | Status: DC | PRN
  Administered 2023-05-01: 10 mL

## 2023-05-01 MED ORDER — HEPARIN SOD (PORK) LOCK FLUSH 100 UNIT/ML IV SOLN
500.0000 [IU] | Freq: Once | INTRAVENOUS | Status: AC | PRN
  Administered 2023-05-01: 500 [IU]

## 2023-05-01 MED ORDER — DEXAMETHASONE SODIUM PHOSPHATE 10 MG/ML IJ SOLN
10.0000 mg | Freq: Once | INTRAMUSCULAR | Status: AC
  Administered 2023-05-01: 10 mg via INTRAVENOUS
  Filled 2023-05-01: qty 1

## 2023-05-01 MED ORDER — SODIUM CHLORIDE 0.9 % IV SOLN: INTRAVENOUS | Status: DC

## 2023-05-01 MED ORDER — SODIUM CHLORIDE 0.9 % IV SOLN
80.0000 mg/m2 | Freq: Once | INTRAVENOUS | Status: AC
  Administered 2023-05-01: 168 mg via INTRAVENOUS
  Filled 2023-05-01: qty 8.4

## 2023-05-01 NOTE — Addendum Note (Signed)
 Encounter addended by: Edward Qualia on: 05/01/2023 12:57 PM  Actions taken: Imaging Exam ended

## 2023-05-01 NOTE — Patient Instructions (Signed)
 [facility]  Discharge Instructions: Thank you for choosing [facility] to provide your oncology and hematology care.   If you have a lab appointment with the Cancer Center, please go directly to the Cancer Center and check in at the registration area.  Wear comfortable clothing and clothing appropriate for easy access to any Portacath or PICC line.   We strive to give you quality time with your provider. You may need to reschedule your appointment if you arrive late (15 or more minutes).  Arriving late affects you and other patients whose appointments are after yours.  Also, if you miss three or more appointments without notifying the office, you may be dismissed from the clinic at the provider's discretion.      For prescription refill requests, have your pharmacy contact our office and allow 72 hours for refills to be completed.    Today you received the following chemotherapy and/or immunotherapy agents Etoposide       To help prevent nausea and vomiting after your treatment, we encourage you to take your nausea medication as directed.  BELOW ARE SYMPTOMS THAT SHOULD BE REPORTED IMMEDIATELY: *FEVER GREATER THAN 100.4 F (38 C) OR HIGHER *CHILLS OR SWEATING *NAUSEA AND VOMITING THAT IS NOT CONTROLLED WITH YOUR NAUSEA MEDICATION *UNUSUAL SHORTNESS OF BREATH *UNUSUAL BRUISING OR BLEEDING *URINARY PROBLEMS (pain or burning when urinating, or frequent urination) *BOWEL PROBLEMS (unusual diarrhea, constipation, pain near the anus) TENDERNESS IN MOUTH AND THROAT WITH OR WITHOUT PRESENCE OF ULCERS (sore throat, sores in mouth, or a toothache) UNUSUAL RASH, SWELLING OR PAIN  UNUSUAL VAGINAL DISCHARGE OR ITCHING   Items with * indicate a potential emergency and should be followed up as soon as possible or go to the Emergency Department if any problems should occur.  Please show the CHEMOTHERAPY ALERT CARD or IMMUNOTHERAPY  ALERT CARD at check-in to the Emergency Department and triage nurse. Should you have questions after your visit or need to cancel or reschedule your appointment, please contact [facility] - A DEPT OF [facility]  [phone] and follow the prompts.  Office hours are 8:00 a.m. to 4:30 p.m. Monday - Friday. Please note that voicemails left after 4:00 p.m. may not be returned until the following business day.  We are closed weekends and major holidays. You have access to a nurse at all times for urgent questions. Please call the main number to the clinic [phone] and follow the prompts.  For any non-urgent questions, you may also contact your provider using MyChart. We now offer e-Visits for anyone 13 and older to request care online for non-urgent symptoms. For details visit [email or web address].   Also download the MyChart app! Go to the app store, search "MyChart", open the app, select [place], and log in with your MyChart username and password.

## 2023-05-01 NOTE — Progress Notes (Signed)
 Patient resumed treatment yesterday after a several week delay from hospitalization and c-diff infection. She will need a PET after this cycle, already scheduled for 05/14/2023.   Oncology Nurse Navigator Documentation     05/01/2023    9:15 AM  Oncology Nurse Navigator Flowsheets  Navigator Follow Up Date: 05/14/2023  Navigator Follow Up Reason: Scan Review  Navigator Location [facility]  Navigator Encounter Type Appt/Treatment Plan Review;Treatment  Patient Visit Type MedOnc  Treatment Phase Active Tx  Barriers/Navigation Needs Coordination of Care;Education  Interventions Psycho-Social Support  Acuity Level 2-Minimal Needs (1-2 Barriers Identified)  Support Groups/Services Friends and Family  Time Spent with Patient 15

## 2023-05-02 ENCOUNTER — Inpatient Hospital Stay: Payer: No Typology Code available for payment source

## 2023-05-02 ENCOUNTER — Inpatient Hospital Stay

## 2023-05-02 ENCOUNTER — Inpatient Hospital Stay: Payer: No Typology Code available for payment source | Admitting: Hematology & Oncology

## 2023-05-02 VITALS — BP 96/61 | HR 70 | Temp 98.0°F | Resp 20

## 2023-05-02 DIAGNOSIS — C342 Malignant neoplasm of middle lobe, bronchus or lung: Secondary | ICD-10-CM

## 2023-05-02 DIAGNOSIS — Z5111 Encounter for antineoplastic chemotherapy: Secondary | ICD-10-CM | POA: Diagnosis not present

## 2023-05-02 MED ORDER — HEPARIN SOD (PORK) LOCK FLUSH 100 UNIT/ML IV SOLN
500.0000 [IU] | Freq: Once | INTRAVENOUS | Status: AC | PRN
  Administered 2023-05-02: 500 [IU]

## 2023-05-02 MED ORDER — SODIUM CHLORIDE 0.9 % IV SOLN: INTRAVENOUS | Status: DC

## 2023-05-02 MED ORDER — SODIUM CHLORIDE 0.9 % IV SOLN
80.0000 mg/m2 | Freq: Once | INTRAVENOUS | Status: AC
  Administered 2023-05-02: 168 mg via INTRAVENOUS
  Filled 2023-05-02: qty 8.4

## 2023-05-02 MED ORDER — DEXAMETHASONE SODIUM PHOSPHATE 10 MG/ML IJ SOLN
10.0000 mg | Freq: Once | INTRAMUSCULAR | Status: AC
  Administered 2023-05-02: 10 mg via INTRAVENOUS
  Filled 2023-05-02: qty 1

## 2023-05-02 MED ORDER — SODIUM CHLORIDE 0.9% FLUSH
10.0000 mL | INTRAVENOUS | Status: DC | PRN
  Administered 2023-05-02: 10 mL

## 2023-05-02 NOTE — Patient Instructions (Signed)
 [facility]  Discharge Instructions: Thank you for choosing [facility] to provide your oncology and hematology care.   If you have a lab appointment with the Cancer Center, please go directly to the Cancer Center and check in at the registration area.  Wear comfortable clothing and clothing appropriate for easy access to any Portacath or PICC line.   We strive to give you quality time with your provider. You may need to reschedule your appointment if you arrive late (15 or more minutes).  Arriving late affects you and other patients whose appointments are after yours.  Also, if you miss three or more appointments without notifying the office, you may be dismissed from the clinic at the provider's discretion.      For prescription refill requests, have your pharmacy contact our office and allow 72 hours for refills to be completed.    Today you received the following chemotherapy and/or immunotherapy agents Etoposide       To help prevent nausea and vomiting after your treatment, we encourage you to take your nausea medication as directed.  BELOW ARE SYMPTOMS THAT SHOULD BE REPORTED IMMEDIATELY: *FEVER GREATER THAN 100.4 F (38 C) OR HIGHER *CHILLS OR SWEATING *NAUSEA AND VOMITING THAT IS NOT CONTROLLED WITH YOUR NAUSEA MEDICATION *UNUSUAL SHORTNESS OF BREATH *UNUSUAL BRUISING OR BLEEDING *URINARY PROBLEMS (pain or burning when urinating, or frequent urination) *BOWEL PROBLEMS (unusual diarrhea, constipation, pain near the anus) TENDERNESS IN MOUTH AND THROAT WITH OR WITHOUT PRESENCE OF ULCERS (sore throat, sores in mouth, or a toothache) UNUSUAL RASH, SWELLING OR PAIN  UNUSUAL VAGINAL DISCHARGE OR ITCHING   Items with * indicate a potential emergency and should be followed up as soon as possible or go to the Emergency Department if any problems should occur.  Please show the CHEMOTHERAPY ALERT CARD or IMMUNOTHERAPY  ALERT CARD at check-in to the Emergency Department and triage nurse. Should you have questions after your visit or need to cancel or reschedule your appointment, please contact [facility] - A DEPT OF [facility]  [phone] and follow the prompts.  Office hours are 8:00 a.m. to 4:30 p.m. Monday - Friday. Please note that voicemails left after 4:00 p.m. may not be returned until the following business day.  We are closed weekends and major holidays. You have access to a nurse at all times for urgent questions. Please call the main number to the clinic [phone] and follow the prompts.  For any non-urgent questions, you may also contact your provider using MyChart. We now offer e-Visits for anyone 13 and older to request care online for non-urgent symptoms. For details visit [email or web address].   Also download the MyChart app! Go to the app store, search "MyChart", open the app, select [place], and log in with your MyChart username and password.

## 2023-05-03 ENCOUNTER — Other Ambulatory Visit: Payer: Self-pay | Admitting: *Deleted

## 2023-05-03 ENCOUNTER — Inpatient Hospital Stay: Payer: No Typology Code available for payment source

## 2023-05-03 ENCOUNTER — Other Ambulatory Visit: Payer: Self-pay

## 2023-05-03 ENCOUNTER — Telehealth: Payer: Self-pay | Admitting: *Deleted

## 2023-05-03 DIAGNOSIS — C7951 Secondary malignant neoplasm of bone: Secondary | ICD-10-CM | POA: Diagnosis not present

## 2023-05-03 DIAGNOSIS — G4700 Insomnia, unspecified: Secondary | ICD-10-CM | POA: Diagnosis not present

## 2023-05-03 DIAGNOSIS — C342 Malignant neoplasm of middle lobe, bronchus or lung: Secondary | ICD-10-CM | POA: Diagnosis not present

## 2023-05-03 DIAGNOSIS — G4730 Sleep apnea, unspecified: Secondary | ICD-10-CM | POA: Diagnosis not present

## 2023-05-03 DIAGNOSIS — K5792 Diverticulitis of intestine, part unspecified, without perforation or abscess without bleeding: Secondary | ICD-10-CM | POA: Diagnosis not present

## 2023-05-03 DIAGNOSIS — A419 Sepsis, unspecified organism: Secondary | ICD-10-CM | POA: Diagnosis not present

## 2023-05-03 DIAGNOSIS — I959 Hypotension, unspecified: Secondary | ICD-10-CM | POA: Diagnosis not present

## 2023-05-03 DIAGNOSIS — U071 COVID-19: Secondary | ICD-10-CM | POA: Diagnosis not present

## 2023-05-03 DIAGNOSIS — E11649 Type 2 diabetes mellitus with hypoglycemia without coma: Secondary | ICD-10-CM | POA: Diagnosis not present

## 2023-05-03 DIAGNOSIS — K2980 Duodenitis without bleeding: Secondary | ICD-10-CM | POA: Diagnosis not present

## 2023-05-03 NOTE — Progress Notes (Signed)
 Error

## 2023-05-03 NOTE — Patient Outreach (Signed)
 Care Management  Transitions of Care Program Transitions of Care Post-discharge week 4   05/03/2023 Name: Amanda Crawford MRN: [id number] DOB: [birth year]  Subjective: Amanda Crawford is a 78 y.o. year old female who is a primary care patient of [name], MD. The Care Management team Collaboration with Wonda Amis, oncology team  to assess and address transitions of care needs.   Consent to Services:  Patient was given information about care management services, agreed to services, and gave verbal consent to participate.   Assessment:   3:45 pm:  Received secure chat message from oncology team re: [facility] program outreach from earlier today/ clarification  Care Coordination outreach request from [facility]/ oncology team re: care coordination message sent to Dr. Myna Hidalgo after call with patient and her daughter earlier today Explained to/ discussed with Amanda Crawford patient's confusion around new antibiotic order prescribed by Dr. Myna Hidalgo, along with additional information that patient has continued taking Vancomycin "extra" dosing, as per note below- patient is easily confused, and we agreed this will be best clarified during in-person infusion visit tomorrow: Amanda Crawford will explain to patient that she and I have communicated today in an effort to clarify for patient exactly what she is supposed to do re: antibiotic therapy Amanda Crawford agreed to clarify with patient tomorrow-- she will personally be working with patient tomorrow Discussed new TOC 30-day program with Amanda Crawford-- she was unaware of this new program being in place: appreciate Hannah's effort and outreach to assist in patient care needs to decrease patient confusion           SDOH Interventions    Flowsheet Row Telephone from 04/13/2023 in [place] [facility] Clinical Support from 07/05/2020 in [facility] Primary Care at [facility]  SDOH Interventions    Food Insecurity Interventions  Intervention Not Indicated --  Housing Interventions Intervention Not Indicated --  Transportation Interventions Intervention Not Indicated  [family provides transportation] --  Utilities Interventions Intervention Not Indicated --  Physical Activity Interventions -- Intervention Not Indicated  [pt has set a goal to increase activity]        Goals Addressed             This Visit's Progress    TOC 30-day Program Care Plan       Current Barriers:  Medication management daughter now assisting with medication management; patient verbalizes good general understanding of current medication regimen Provider appointments confirmed has attended hospital follow up office visit with PCP on 04/16/23 Home Health services confirmed active with [facility] 04/18/23: Patient called me back after initial TOC call placed on 04/13/23: she has questions about a medication: she had found an old antibiotic bottle at her home, from prior to her recent hospital admission: she is asking if she should have taken this, and also asking if she should start taking it now; reviewed post-hospital discharge instructions: confirmed that patient was instructed to stop taking the antibiotic at time of hospital discharge- patient appreciative of the follow up information Given patient's call-back post- initial TOC call on 04/13/23:  Successfully enrolled into 30-day TOC program today 04/18/23- as week # 2 call Recent hospital admission: 3/9-13/2025: shock secondary to recurrent diverticulitis/ COVID-19; hypoglycemia/ hypotension Independent in self-care; resides with supportive spouse; has local adult children who are also very involved and supportive- adult daughter has initiated helping patient manage medications post-recent hospital discharge- patient was previously independent in medication management Sacral bedsore- unknown cause 04/18/23: reports that he ankles have been  swollen "for the last couple of days;" states she held  only one day of the diuretic as advised post-hospital discharge: but "for some reason, my ankles are now puffy;" reports, "I take the fluid pill every day and they have never been swollen before;"   Discussed possibility of IV fluid from hospital discharge causing this to show up late post-discharge Confirmed that patient does not monitor or record daily weights prior to this admission: "I was never told to, and my ankles never did swell before" 04/26/23: confirms LE edema "is much better;" confirms she has started daily weights at home: see interventions section 04/19/23:  Stool sample resulted positive for C-Diff- care coordination outreach with PCP and patient completed, as per PCP request: see note from 04/19/23   RNCM Clinical Goal(s):  Patient will work with the Care Management team over the next 30 days to address Transition of Care Barriers: Medication Management Provider appointments Home Health services attend all scheduled medical appointments: oncology office visits next week: 3/24; 3/25; 3/26; 3/28 as evidenced by review of same with patient during weekly TOC 30-day program outreaches with RN CM not experience hospital admission as evidenced by review of EMR. Hospital Admissions in last 6 months = 1  through collaboration with RN Care manager, provider, and care team.   Interventions: Evaluation of current treatment plan related to  self management and patient's adherence to plan as established by provider  3:45 pm:  Received secure chat message from oncology team re: [facility] program outreach from earlier today/ clarification  Care Coordination outreach request from [facility] [place]/ oncology team re: care coordination message sent to Dr. Myna Hidalgo after call with patient and her daughter earlier today Explained to/ discussed with Amanda Crawford patient's confusion around new antibiotic order prescribed by Dr. Myna Hidalgo, along with additional information that patient has continued taking Vancomycin  "extra" dosing, as per note below- patient is easily confused, and we agreed this will be best clarified during in-person infusion visit tomorrow: Amanda Crawford will explain to patient that she and I have communicated today in an effort to clarify for patient exactly what she is supposed to do re: antibiotic therapy Amanda Crawford agreed to clarify with patient tomorrow-- she will personally be working with patient tomorrow Discussed new TOC 30-day program with Amanda Crawford-- she was unaware of this new program being in place: appreciate Hannah's effort and outreach to assist in patient care needs to decrease patient confusion  Transitions of Care:  Goal on track:  Yes. 05/03/23 Durable Medical Equipment (DME) needs assessed with patient/caregiver Doctor Visits  - discussed the importance of doctor visits Post-op wound/incision care reviewed with patient/caregiver Discussed current clinical condition:  "I am doing okay, tolerated the chemo sessions okay this week.  The sore on my bottom is tender feeling, so there is a nurse coming today from home health to look at it; I got the instructions about the decadron straightened out when I went to the chemotherapy session.   I had no idea that Dr. Myna Hidalgo prescribed me a new antibiotic when I saw him on Monday 04/30/23- will you call my daughter and tell her about this?  I have been taking the extra doses the outpatient pharmacy gave me of the vancomycin, even though I completed the recommended 10-day course.  I don't know if I should take both of them or just the new one?  Also, my weights have started creeping up just a little bit, and there is a tiny bit of puffiness in my ankles, I  guess I should consider taking the extra one-half dose of the fluid pill, like Dr. Abner Greenspan told me I could."  Patient's daughter was contacted after 35 minute phone call with patient and updated- daughter verbalized understanding of all issues as documented below; Patient denies urgent clinical concerns and  sounds to be in no distress throughout [facility] 30-day program outreach call today As above- patient confused about new prescription for antibiotic (Bactrim) as per oncology provider visit on 04/30/23: confirmed patient continues self-managing medications with assistance from her daughter- provided much repetition with patient and contacted patients daughter to make her abreast of this issue as well; confirmed that patient has oncology appointment tomorrow- requested that she follow up with oncology team for additional questions after she contacts her outpatient pharmacy and she is agreeable;  additionally, will reach out to PCP for care coordination to determine if PCP would like for patient to continue taking pantoprazole, as this medication was ordered post- hospital discharge, and there are no refills: PCP will need to send in new Rx if she wishes for patient to continue taking 05/03/23:  reported that she has had "no idea" that this new antibiotic was prescribed on 04/30/23 by oncology provider, to start on 05/04/23-- recommended that she contact outpatient pharmacy to see if Rx was received, and to clarify with oncology team at time of 05/04/23 oncology provider office visit- patient and daughter both aware and agreeable 05/03/23:  reported she has completed 10-day course but outpatient pharmacy gave her 56 pills; she reports she has continued taking "all" of the entire 56 pills and still has some in her pill box; (past the prescribed 10-day course)- given that oncology provider just prescribed new antibiotic on 04/30/23, to start on 05/04/23-- as well as recent C-Diff diagnosis- I recommended that she clarify with oncology provider on 05/04/23 visit what she should/ should not take- patient and daughter both aware and agreeable-- will send Dr. Myna Hidalgo care coordination message as well as an [facility] PT services- confirmed active; encouraged patient's active participation/ engagement; reinforced role of home  health services with importance of participation/ ongoing engagement: confirmed that PCP added skilled nursing for wound care after care coordination outreach on 04/26/23 Thoroughly reinforced care of her sacral bedsore as per post-hospital follow up PCP office visit instructions- encouraged patient to have the home health nurse assess wound and encouraged her to thoroughly discuss future home visits and how she should treat at home in between home health visits: encouraged her active engagement with home health nurse and for her to make sure she understands plan for wound care/ future home health visits for wound care prior to the nnurse leaving her home today- she verbalizes understanding and agreement with same- reinforced this with patient's daughter as well Confirmed still not currently requiring/ using assistive devices on a regular basis/ independent in self-care activities: occasionally uses walker/ [facility] Confirmed patient continued obtaining daily weights at home: reviewed recent weights from home monitoring with patient: she reports consistent weights "this week of 210-211 lbs;"  this represents a slight weight gain from last week's reported weights at home, which were reported as "between 206-207 lbs;" she denies shortness of breath during TOC call, but states, "I feel a little short of breath when I lie down at night;" she also endorses "very slight puffiness" in her ankles: reminded patient of PCP recommendations that she take one-half extra fluid pill for signs/ symptoms fluid retention, and patient stated she would take extra one-half dose, given  her reported signs/ symptoms today Reinforced previously provided education / rationale for daily weight monitoring at home as earliest indicator of fluid retention, along with weight gain guidelines/ action plan for weight gain; importance of taking diuretic as prescribed- positive reinforcement provided with encouragement to continue efforts Explicitly  reinforced previously provided education around weight gain guidelines with both patient and her daughter: weight gain > 3 lbs overnight/ 5 lbs in one week- take one half "extra" diuretic when this occurs- they both seem to understand this at time of TOC call today Confirmed patient plans to attend chemotherapy session planned for tomorrow 05/04/23--- strongly encouraged patient to discuss the new antibiotic prescription from 04/30/23, which is to start on 05/04/23, and whether or not patient should stop taking vancomycin, since her previously prescribed 10-day course has now been completed TOC 30-day program initial assessment-- very gradual progression today given multiple and lengthy calls, as well as care coordination outreach to providers x 2 Confirmed patient and her daughter both have my direct phone number should any further questions arise prior to next scheduled outreach  Patient Goals/Self-Care Activities: Participate in Transition of Care Program/Attend TOC scheduled calls Take all medications as prescribed Attend all scheduled provider appointments Call provider office for new concerns or questions  Please ask your oncology team to clarify the new antibiotic instructions/ prescription from 04/30/23, when you see them for your visit tomorrow on 05/04/23 Weigh yourself every day to stay on top of early fluid retention: write down your weights every day so you remember what it is from day to day: follow the weight-gain guidelines and action plan to call your doctor if you gain more than 3 lbs overnight, or 5 lbs in one week Continue working with the home health team that is involved in your care Continue caring for the sore on your bottom exactly like the doctor instructed you to-- please ask the home health nurse to make sure you know how to take care of this wound in between their home visits  Follow Up Plan:  Telephone follow up appointment with care management team member scheduled for:   Tuesday 05/08/23 at 2:15 pm    Plan for next week's call: Review daily weights since Thursday 05/03/23 Review home health PT visits- follow up home health nurse plan for wound care/ assessment Patient report of wound (sacral) assessment Review oncology provider office visits for re-start of chemo/ WBC from 05/04/23: follow up to ensure patient understands/ received update on prohylactic antibiotics ordered 04/30/23 Reinforce education re: self health management of CHF/ daily weights Progression of TOC 30-day program initial assessment, if allowed       Plan: Telephone follow up appointment with care management team member scheduled for:   Tuesday 05/08/23 at 2:15 pm Amanda Crawford will reinforce and clarify antibiotic therapy during infusion visit on 05/04/23, as per oncology provider instructions/ Rx on 04/30/23- Amanda Crawford knows I am available as needed for ongoing care coordination  Total time spent from review to signing of note/ including any care coordination interventions: 33 minutes- care coordination outreaches via secure chat and then by phone; documentation  Pls call/ message for questions,  Caryl Pina, RN, BSN, Media planner  Transitions of Care  [facility] - [facility] [phone]: direct office

## 2023-05-03 NOTE — Patient Outreach (Signed)
 Care Management  Transitions of Care Program Transitions of Care Post-discharge week 4/ day # 20   05/03/2023 Name: Amanda Crawford MRN: [id number] DOB: 02-15-1945  Subjective: Amanda Crawford is a 78 y.o. year old female who is a primary care patient of [name], MD. The Care Management team Engaged with patient Engaged with patient by telephone to assess and address transitions of care needs.   Consent to Services:  Patient was given information about care management services, agreed to services, and gave verbal consent to participate.   Enrolled into TOC 30-day program:  04/13/23  Assessment: "I am doing okay, tolerated the chemo sessions okay this week.  The sore on my bottom is tender feeling, so there is a nurse coming today from home health to look at it; I got the instructions about the decadron straightened out when I went to the chemotherapy session.   I had no idea that Dr. Myna Hidalgo prescribed me a new antibiotic when I saw him on Monday 04/30/23- will you call my daughter and tell her about this?  I have been taking the extra doses the outpatient pharmacy gave me of the vancomycin, even though I completed the recommended 10-day course.  I don't know if I should take both of them or just the new one?  Also, my weights have started creeping up just a little bit, and there is a tiny bit of puffiness in my ankles, I guess I should consider taking the extra one-half dose of the fluid pill, like Dr. Abner Greenspan told me I could."    Patient's daughter was contacted after 35 minute phone call with patient, and updated- daughter verbalized understanding of all issues as documented in care plan; Patient denies urgent clinical concerns and sounds to be in no distress throughout [facility] 30-day program outreach call today-- required extensive repetition and reinforcement throughout calls to both patient and her daughter          SDOH Interventions    Flowsheet Row Telephone from 04/13/2023 in [place]  POPULATION HEALTH DEPARTMENT Clinical Support from 07/05/2020 in [facility] at [facility]  SDOH Interventions    Food Insecurity Interventions Intervention Not Indicated --  Housing Interventions Intervention Not Indicated --  Transportation Interventions Intervention Not Indicated  [family provides transportation] --  Utilities Interventions Intervention Not Indicated --  Physical Activity Interventions -- Intervention Not Indicated  [pt has set a goal to increase activity]        Goals Addressed             This Visit's Progress    TOC 30-day Program Care Plan   On track    Current Barriers:  Medication management daughter now assisting with medication management; patient verbalizes good general understanding of current medication regimen Provider appointments confirmed has attended hospital follow up office visit with PCP on 04/16/23 Home Health services confirmed active with [facility] 04/18/23: Patient called me back after initial TOC call placed on 04/13/23: she has questions about a medication: she had found an old antibiotic bottle at her home, from prior to her recent hospital admission: she is asking if she should have taken this, and also asking if she should start taking it now; reviewed post-hospital discharge instructions: confirmed that patient was instructed to stop taking the antibiotic at time of hospital discharge- patient appreciative of the follow up information Given patient's call-back post- initial TOC call on 04/13/23:  Successfully enrolled into 30-day TOC program today  04/18/23- as week # 2 call Recent hospital admission: 3/9-13/2025: shock secondary to recurrent diverticulitis/ COVID-19; hypoglycemia/ hypotension Independent in self-care; resides with supportive spouse; has local adult children who are also very involved and supportive- adult daughter has initiated helping patient manage medications post-recent hospital discharge- patient  was previously independent in medication management Sacral bedsore- unknown cause 04/18/23: reports that he ankles have been swollen "for the last couple of days;" states she held only one day of the diuretic as advised post-hospital discharge: but "for some reason, my ankles are now puffy;" reports, "I take the fluid pill every day and they have never been swollen before;"   Discussed possibility of IV fluid from hospital discharge causing this to show up late post-discharge Confirmed that patient does not monitor or record daily weights prior to this admission: "I was never told to, and my ankles never did swell before" 04/26/23: confirms LE edema "is much better;" confirms she has started daily weights at home: see interventions section 04/19/23:  Stool sample resulted positive for C-Diff- care coordination outreach with PCP and patient completed, as per PCP request: see note from 04/19/23   RNCM Clinical Goal(s):  Patient will work with the Care Management team over the next 30 days to address Transition of Care Barriers: Medication Management Provider appointments Home Health services attend all scheduled medical appointments: oncology office visits next week: 3/24; 3/25; 3/26; 3/28 as evidenced by review of same with patient during weekly TOC 30-day program outreaches with RN CM not experience hospital admission as evidenced by review of EMR. Hospital Admissions in last 6 months = 1  through collaboration with RN Care manager, provider, and care team.   Interventions: Evaluation of current treatment plan related to  self management and patient's adherence to plan as established by provider  Transitions of Care:  Goal on track:  Yes. 05/03/23 Durable Medical Equipment (DME) needs assessed with patient/caregiver Doctor Visits  - discussed the importance of doctor visits Post-op wound/incision care reviewed with patient/caregiver Discussed current clinical condition:  "I am doing okay, tolerated  the chemo sessions okay this week.  The sore on my bottom is tender feeling, so there is a nurse coming today from home health to look at it; I got the instructions about the decadron straightened out when I went to the chemotherapy session.   I had no idea that Dr. Myna Hidalgo prescribed me a new antibiotic when I saw him on Monday 04/30/23- will you call my daughter and tell her about this?  I have been taking the extra doses the outpatient pharmacy gave me of the vancomycin, even though I completed the recommended 10-day course.  I don't know if I should take both of them or just the new one?  Also, my weights have started creeping up just a little bit, and there is a tiny bit of puffiness in my ankles, I guess I should consider taking the extra one-half dose of the fluid pill, like Dr. Abner Greenspan told me I could."  Patient's daughter was contacted after 35 minute phone call with patient and updated- daughter verbalized understanding of all issues as documented below; Patient denies urgent clinical concerns and sounds to be in no distress throughout [facility] 30-day program outreach call today As above- patient confused about new prescription for antibiotic (Bactrim) as per oncology provider visit on 04/30/23: confirmed patient continues self-managing medications with assistance from her daughter- provided much repetition with patient and contacted patients daughter to make her abreast of this issue  as well; confirmed that patient has oncology appointment tomorrow- requested that she follow up with oncology team for additional questions after she contacts her outpatient pharmacy and she is agreeable;  additionally, will reach out to PCP for care coordination to determine if PCP would like for patient to continue taking pantoprazole, as this medication was ordered post- hospital discharge, and there are no refills: PCP will need to send in new Rx if she wishes for patient to continue taking 05/03/23:  reported that she has had  "no idea" that this new antibiotic was prescribed on 04/30/23 by oncology provider, to start on 05/04/23-- recommended that she contact outpatient pharmacy to see if Rx was received, and to clarify with oncology team at time of 05/04/23 oncology provider office visit- patient and daughter both aware and agreeable 05/03/23:  reported she has completed 10-day course but outpatient pharmacy gave her 56 pills; she reports she has continued taking "all" of the entire 56 pills and still has some in her pill box; (past the prescribed 10-day course)- given that oncology provider just prescribed new antibiotic on 04/30/23, to start on 05/04/23-- as well as recent C-Diff diagnosis- I recommended that she clarify with oncology provider on 05/04/23 visit what she should/ should not take- patient and daughter both aware and agreeable-- will send Dr. Myna Hidalgo care coordination message as well as an [facility] PT services- confirmed active; encouraged patient's active participation/ engagement; reinforced role of home health services with importance of participation/ ongoing engagement: confirmed that PCP added skilled nursing for wound care after care coordination outreach on 04/26/23 Thoroughly reinforced care of her sacral bedsore as per post-hospital follow up PCP office visit instructions- encouraged patient to have the home health nurse assess wound and encouraged her to thoroughly discuss future home visits and how she should treat at home in between home health visits: encouraged her active engagement with home health nurse and for her to make sure she understands plan for wound care/ future home health visits for wound care prior to the nnurse leaving her home today- she verbalizes understanding and agreement with same- reinforced this with patient's daughter as well Confirmed still not currently requiring/ using assistive devices on a regular basis/ independent in self-care activities: occasionally uses walker/  [facility] Confirmed patient continued obtaining daily weights at home: reviewed recent weights from home monitoring with patient: she reports consistent weights "this week of 210-211 lbs;"  this represents a slight weight gain from last week's reported weights at home, which were reported as "between 206-207 lbs;" she denies shortness of breath during TOC call, but states, "I feel a little short of breath when I lie down at night;" she also endorses "very slight puffiness" in her ankles: reminded patient of PCP recommendations that she take one-half extra fluid pill for signs/ symptoms fluid retention, and patient stated she would take extra one-half dose, given her reported signs/ symptoms today Reinforced previously provided education / rationale for daily weight monitoring at home as earliest indicator of fluid retention, along with weight gain guidelines/ action plan for weight gain; importance of taking diuretic as prescribed- positive reinforcement provided with encouragement to continue efforts Explicitly reinforced previously provided education around weight gain guidelines with both patient and her daughter: weight gain > 3 lbs overnight/ 5 lbs in one week- take one half "extra" diuretic when this occurs- they both seem to understand this at time of TOC call today Confirmed patient plans to attend chemotherapy session planned for tomorrow 05/04/23--- strongly  encouraged patient to discuss the new antibiotic prescription from 04/30/23, which is to start on 05/04/23, and whether or not patient should stop taking vancomycin, since her previously prescribed 10-day course has now been completed TOC 30-day program initial assessment-- very gradual progression today given multiple and lengthy calls, as well as care coordination outreach to providers x 2 Confirmed patient and her daughter both have my direct phone number should any further questions arise prior to next scheduled outreach  Patient Goals/Self-Care  Activities: Participate in Transition of Care Program/Attend TOC scheduled calls Take all medications as prescribed Attend all scheduled provider appointments Call provider office for new concerns or questions  Please ask your oncology team to clarify the new antibiotic instructions/ prescription from 04/30/23, when you see them for your visit tomorrow on 05/04/23 Weigh yourself every day to stay on top of early fluid retention: write down your weights every day so you remember what it is from day to day: follow the weight-gain guidelines and action plan to call your doctor if you gain more than 3 lbs overnight, or 5 lbs in one week Continue working with the home health team that is involved in your care Continue caring for the sore on your bottom exactly like the doctor instructed you to-- please ask the home health nurse to make sure you know how to take care of this wound in between their home visits  Follow Up Plan:  Telephone follow up appointment with care management team member scheduled for:  Tuesday 05/08/23 at 2:15 pm    Plan for next week's call: Review daily weights since Thursday 05/03/23 Review home health PT visits- follow up home health nurse plan for wound care/ assessment Patient report of wound (sacral) assessment Review oncology provider office visits for re-start of chemo/ WBC from 05/04/23: follow up to ensure patient understands/ received update on prohylactic antibiotics ordered 04/30/23 Reinforce education re: self health management of CHF/ daily weights Progression of TOC 30-day program initial assessment, if allowed       Plan: Telephone follow up appointment with care management team member scheduled for:   Tuesday 05/08/23 at 2:15 pm  Total time spent from review to signing of note/ including any care coordination interventions: 105 minutes  Pls call/ message for questions,  Caryl Pina, RN, BSN, Media planner  Transitions of Care  [facility]  - [facility] [phone]: direct office

## 2023-05-03 NOTE — Patient Instructions (Signed)
 Visit Information  Thank you for taking time to visit with me today. Please don't hesitate to contact me if I can be of assistance to you before our next scheduled telephone appointment.  Our next appointment is by telephone on Tuesday 05/08/23 at 2:15 pm  Please call the care guide team at [phone] if you need to cancel or reschedule your appointment.   Following are the goals we discussed today:  Patient Goals/Self-Care Activities: Participate in Transition of Care Program/Attend [facility] scheduled calls Take all medications as prescribed Attend all scheduled provider appointments Call provider office for new concerns or questions  Please ask your oncology team to clarify the new antibiotic instructions/ prescription from 04/30/23, when you see them for your visit tomorrow on 05/04/23 Weigh yourself every day to stay on top of early fluid retention: write down your weights every day so you remember what it is from day to day: follow the weight-gain guidelines and action plan to call your doctor if you gain more than 3 lbs overnight, or 5 lbs in one week Continue working with the home health team that is involved in your care Continue caring for the sore on your bottom exactly like the doctor instructed you to-- please ask the home health nurse to make sure you know how to take care of this wound in between their home visits  If you are experiencing a Mental Health or Behavioral Health Crisis or need someone to talk to, please  call the Suicide and Crisis Lifeline: 988 call the [place] National Suicide Prevention Lifeline: (618)669-9853 or TTY: 2343749866 TTY 9371060642) to talk to a trained counselor call 1-800-273-TALK (toll free, 24 hour hotline) go to [facility] [address] [place] [id number]) call the [facility] Crisis Line: (458)492-0593 call 911   Patient and caregiver/ daughter verbalizes understanding of instructions and care  plan provided today and agrees to view in MyChart. Active MyChart status and patient understanding of how to access instructions and care plan via MyChart confirmed with patient.     Caryl Pina, RN, BSN, Media planner  Transitions of Care  [facility] - [facility] [phone]: direct office

## 2023-05-04 ENCOUNTER — Inpatient Hospital Stay

## 2023-05-04 ENCOUNTER — Inpatient Hospital Stay: Payer: No Typology Code available for payment source

## 2023-05-04 ENCOUNTER — Telehealth: Payer: Self-pay | Admitting: Emergency Medicine

## 2023-05-04 VITALS — BP 105/64 | HR 91 | Temp 98.0°F | Resp 21

## 2023-05-04 DIAGNOSIS — C342 Malignant neoplasm of middle lobe, bronchus or lung: Secondary | ICD-10-CM

## 2023-05-04 DIAGNOSIS — Z5111 Encounter for antineoplastic chemotherapy: Secondary | ICD-10-CM | POA: Diagnosis not present

## 2023-05-04 MED ORDER — PEGFILGRASTIM-JMDB 6 MG/0.6ML ~~LOC~~ SOSY
6.0000 mg | PREFILLED_SYRINGE | Freq: Once | SUBCUTANEOUS | Status: AC
Start: 2023-05-04 — End: 2023-05-04
  Administered 2023-05-04: 6 mg via SUBCUTANEOUS
  Filled 2023-05-04: qty 0.6

## 2023-05-04 NOTE — Patient Instructions (Signed)

## 2023-05-04 NOTE — Telephone Encounter (Signed)
 Copied from CRM [id number]. Topic: Clinical - Home Health Verbal Orders >> May 03, 2023  4:51 PM Denese Killings wrote: Caller/Agency: [facility] Callback Number: [phone] Service Requested: Skilled Nursing Frequency: 1 week 3  Any new concerns about the patient? No

## 2023-05-04 NOTE — Progress Notes (Signed)
 Reviewed pt meds with pt stating "I know to take the Bactrim and I stopped the Vancomyocin. The nurse yesterday did a great job of going over all of it"  Pt states she has no further questions about her medications.

## 2023-05-07 ENCOUNTER — Inpatient Hospital Stay: Payer: No Typology Code available for payment source

## 2023-05-07 ENCOUNTER — Inpatient Hospital Stay: Payer: No Typology Code available for payment source | Admitting: Hematology & Oncology

## 2023-05-08 ENCOUNTER — Inpatient Hospital Stay: Payer: No Typology Code available for payment source

## 2023-05-08 ENCOUNTER — Other Ambulatory Visit: Payer: Self-pay

## 2023-05-08 ENCOUNTER — Encounter: Payer: Self-pay | Admitting: Hematology & Oncology

## 2023-05-08 ENCOUNTER — Other Ambulatory Visit: Admitting: *Deleted

## 2023-05-08 MED ORDER — FLUCONAZOLE 100 MG PO TABS: 100.0000 mg | ORAL_TABLET | Freq: Every day | ORAL | 3 refills | Status: DC

## 2023-05-08 NOTE — Progress Notes (Signed)
 Patients daughter Rosey Bath called stating patient started bactrim on 4/4 and has since developed redness, swelling and peeling under breast and in groin area. States it is very painful and is almost black in color in some spots. Discussed with MD. Patient okay to stop the Bactrim and order placed for Diflucan, patients daughter informed and also informed to give patient 40mg  of pepcid at home today and tomorrow as well.

## 2023-05-08 NOTE — Patient Outreach (Signed)
 Transition of Care  week # 5/ day # 25  Visit Note  05/08/2023  Name: Amanda Crawford MRN: [id number]          DOB: 27-Apr-1945  Situation: Patient enrolled in [facility] 30-day program. Visit completed with patient by telephone.   HIPAA identifiers verified x 2  Background:  Recent hospitalization March 9-13, 2025 for recurrent diverticulitis/ shock; COVID Recent post-hospital discharge diagnosis of C-Diff  Initial Transition Care Management Follow-up Telephone Call    Past Medical History:  Diagnosis Date   Allergic state 03/12/2015   Arthritis    Cirrhosis (HCC)    Colon polyp 11/19/2014   Dermatitis 03/12/2015   Diarrhea 06/29/2016   Diverticulitis    DM (diabetes mellitus), type 2 (HCC) 06/29/2016   GERD (gastroesophageal reflux disease)    occ   Gout 03/12/2015   H/O measles    H/O mumps    Headache(784.0)    migraines   History of chicken pox 11/23/2014   Hyperglycemia 06/29/2016   Insomnia 02/13/2023   Lung cancer (HCC)    Migraine 11/23/2014   Obesity 11/23/2014   Pneumonia    child   Preventative health care 09/12/2015   Primary hypertension 12/15/2020   Renal cell cancer (HCC)    renal cell ca dx 9/01 and 8/08;   Renal insufficiency    Shortness of breath    occ   Skin lesion of breast 03/12/2015   Sleep apnea 01/24/2023   recently dx 01/24/23 - CPAP ordered by not received yet   Small cell carcinoma of middle lobe of right lung (HCC) 03/14/2023   Small cell lung cancer, right middle lobe (HCC) 03/14/2023    Assessment: Patient Reported Symptoms:  Cognitive @FLOW (205576::1))@  Neurological No symptoms reported, Not assessed (not indicated)    HEENT Not assessed, No symptoms reported (not indicated)    Cardiovascular No symptoms reported    Respiratory No symptoms reported    Endocrine No symptoms reported, Not assessed (not indicated)    Gastrointestinal No symptoms reported, Not assessed (not indicated)    Genitourinary No symptoms reported,  Not assessed (not indicated)    Integumentary Skin changes, Wound, Other "I developed a terrible rash after taking the Bactrim that the cancer doctor prescribed- my skin looks raw, reddened and uncomfortable; I have called the doctor and they have stopped that medicine and told me to start a new one, which I have done"  Musculoskeletal No symptoms reported, Not assessed (not indicated; confirmed still not requiring use of assistive devices for ambulation)    Psychosocial No symptoms reported, Not assessed (not indicated)     There were no vitals filed for this visit.  Medications Reviewed Today     Reviewed by Michaela Corner, RN (Registered Nurse) on 05/08/23 at 1330  Med List Status: <None>   Medication Order Taking? Sig Documenting Provider Last Dose Status Informant  acidophilus (RISAQUAD) CAPS capsule [id number]  Take 1 capsule by mouth daily. Joycelyn Das, MD  Active            Med Note Michaela Corner   Fri Apr 13, 2023  2:57 PM) 04/13/23: Reports during TOC call to pick up from outpatient pharmacy today  allopurinol (ZYLOPRIM) 300 MG tablet [id number]  TAKE 1 TABLET BY MOUTH EVERY DAY [name], MD  Active Self, Child  aspirin EC 81 MG tablet [id number]  Take 81 mg by mouth daily. [provider]  Active Self, Child  atorvastatin (LIPITOR) 20 MG tablet  [id number]  Take 1 tablet (20 mg total) by mouth daily. [name], MD  Active Self, Child  carvedilol (COREG) 3.125 MG tablet [id number]  Take 0.5 tablets (1.5625 mg total) by mouth 2 (two) times daily with a meal. Leslye Peer, MD  Active Self, Child  dexamethasone (DECADRON) 4 MG tablet [id number]  Take 2 tablets daily for 2 days, on days 4 and 5.Take with food. Every 21 days. Josph Macho, MD  Active Self, Child           Med Note Dorisann Frames Apr 08, 2023 11:30 AM) Patient is to take after chemotherapy. Should have been taken Feb 27 and 28 but patient thought it was PRN med and did not take. Next  chemo starts March 17 so patient should take March 20 and 21.  diphenhydramine-acetaminophen (TYLENOL PM) 25-500 MG TABS [id number]  Take 1 tablet by mouth at bedtime as needed (sleep). [provider]  Active Self, Child           Med Note Rutherford Nail, Lenard Lance Apr 08, 2023 11:17 AM)    ferrous sulfate 325 (65 FE) MG tablet [id number]  Take 325 mg by mouth at bedtime. [provider]  Active Self, Child  fluconazole (DIFLUCAN) 100 MG tablet [id number] Yes Take 1 tablet (100 mg total) by mouth daily. Josph Macho, MD Taking Active   furosemide (LASIX) 40 MG tablet [id number]  TAKE 1 TABLET BY MOUTH EVERY DAY Hilty, Lisette Abu, MD  Active Self, Child           Med Note Michaela Corner   Fri Apr 13, 2023  3:01 PM) 04/13/23: Caregiver reports during [facility] call good understanding to restart on 04/14/23:  advised her to discuss with PCP during HFU office visit on 04/16/23- whether or not to adjust/ modify   glimepiride (AMARYL) 2 MG tablet [id number]  TAKE 1 TABLET BY MOUTH EVERY DAY WITH BREAKFAST [name], MD  Active Self, Child  glucose blood (ONETOUCH VERIO) test strip [id number]  Check Blood Sugar 3 times daily. [name], MD  Active Self, Child    Discontinued 03/25/19 1548            Med Note Moishe Spice   Wed Jan 10, 2023 10:26 AM) Pt is not taking.  hyoscyamine (LEVSIN SL) 0.125 MG SL tablet [id number]  Place 1 tablet (0.125 mg total) under the tongue every 6 (six) hours as needed. [name], MD  Active   insulin glargine (LANTUS) 100 unit/mL SOPN [id number]  Inject 14 Units into the skin at bedtime. 04/26/23: Reports during TOC 30-day program call: she is currently taking this insulin 14 U at bedtime: reports has not started Semglee insulin because she still had Lantus at home to use- per PCP advice [name], MD  Active Self           Med Note Michaela Corner   Thu Apr 26, 2023  1:27 PM) 04/26/23: Reports during TOC 30-day program call: she is currently  taking this insulin 14 U at bedtime: reports has not started Semglee insulin because she still had Lantus at home to use- per PCP advice   Insulin Pen Needle (BD PEN NEEDLE NANO 2ND GEN) 32G X 4 MM MISC [id number]  USE AS DIRECTED WITH LANTUS ONCE A DAY. [name], MD  Active Self, Child  irbesartan (AVAPRO) 75 MG tablet [id number]  TAKE 1 TABLET BY  MOUTH EVERY DAY [name], MD  Active   Lancets [facility] DELICA PLUS [place]) MISC [id number]  USE AS DIRECTED TO TEST DAILY [name], MD  Active Self, Child  meclizine (ANTIVERT) 12.5 MG tablet [id number]  Take 1 tablet (12.5 mg total) by mouth 3 (three) times daily as needed for dizziness. As needed for   Dizziness or nausea [name], MD  Active Self, Child           Med Note Dorisann Frames Apr 08, 2023 11:19 AM)    nystatin cream (MYCOSTATIN) [id number]  Apply 1 application topically 2 (two) times daily.  Patient taking differently: Apply 1 application  topically 2 (two) times daily as needed (rash).   [name], MD  Active Self, Child           Med Note Dorisann Frames Apr 08, 2023 11:19 AM)    ondansetron (ZOFRAN) 8 MG tablet [id number]  Take 1 tablet (8 mg total) by mouth every 8 (eight) hours as needed for nausea or vomiting. Start on third day after chemotherapy. Josph Macho, MD  Active Self, Child  pantoprazole (PROTONIX) 40 MG tablet [id number]  Take 1 tablet (40 mg total) by mouth daily. Pokhrel, Laxman, MD  Active   prochlorperazine (COMPAZINE) 10 MG tablet [id number]  Take 1 tablet (10 mg total) by mouth every 6 (six) hours as needed for nausea or vomiting (Nausea or vomiting). Josph Macho, MD  Active Self, Child  SEMGLEE, YFGN, 100 UNIT/ML Pen [id number]  Inject 14 Units into the skin at bedtime.  Patient not taking: Reported on 04/26/2023   [provider]  Active Self, Child           Med Note Michaela Corner   Thu Apr 26, 2023  4:50 PM) 04/26/23: Reports during TOC 30-day program  call: she is currently NOT taking this insulin-- states she was told to finish taking what she had at home prior to starting:  Reports currently taking Lantus Insulin 14 U at bedtime: confirms has not started Semglee insulin because she still had Lantus at home to use- per PCP advice "whenever she first changed it"  vancomycin (VANCOCIN) 125 MG capsule [id number]  Take 125 mg by mouth 4 (four) times daily. [provider]  Active            Med Note Michaela Corner   Thu May 03, 2023  1:53 PM) 05/03/23:  Reports during [facility] 30-day program that she has completed 10-day course but outpatient pharmacy gave her 56 pills; she reports she has continued taking "all" past the prescribed 10-day course- given that oncology provider just prescribed new antibiotic on 04/30/23, to start on 05/04/23-- as well as recent C-Diff diagnosis- I recommended that she clarify with oncology provider on 05/04/23 visit what she should/ should not take- patient and   Vitamin D, Ergocalciferol, (DRISDOL) 1.25 MG (50000 UNIT) CAPS capsule [id number]  TAKE 1 CAPSULE (50,000 UNITS TOTAL) BY MOUTH EVERY 7 (SEVEN) DAYS. Eulis Foster, FNP  Active Self, Child           Med Note Dorisann Frames Apr 08, 2023 11:21 AM) Sunday  zolpidem (AMBIEN) 5 MG tablet [id number]  Take 1 tablet (5 mg total) by mouth at bedtime. Tomma Lightning, MD  Active            Recommendation:   Ongoing follow up through [facility] 30-day program  weekly outreaches  Follow Up Plan:   Telephone follow-up in 1 week  Plan for next week's call: Review daily weights since Tuesday 05/08/23 Review home health PT visits- follow up home health nurse plan for wound care/ assessment Patient report of wound (sacral) assessment Review provider office visits PET scan on 05/14/23 and PCP virtual visit on 05/15/23 Reinforce education re: self health management of CHF/ daily weights Case closure if no hospital re-admission with transfer to longitudinal RN CM  Total  time spent from review to signing of note/ including any care coordination interventions:  59 minutes: patient remains very challenging to direct/ difficulty focusing  Pls call/ message for questions,  Caryl Pina, RN, BSN, CCRN Alumnus RN Care Manager  Transitions of Care  [facility] - [facility] [phone]: direct office

## 2023-05-08 NOTE — Patient Instructions (Signed)
 Visit Information  Thank you for taking time to visit with me today. Please don't hesitate to contact me if I can be of assistance to you before our next scheduled telephone appointment.  Our next appointment is by telephone on Tuesday 05/15/23 at 3:00 pm  Please call the care guide team at [phone] if you need to cancel or reschedule your appointment.   Following are the goals we discussed today:  Patient Goals/Self-Care Activities: Participate in Transition of Care Program/Attend TOC scheduled calls Take all medications as prescribed Attend all scheduled provider appointments Call provider office for new concerns or questions  Weigh yourself every day to stay on top of early fluid retention: write down your weights every day so you remember what it is from day to day: follow the weight-gain guidelines and action plan to call your doctor if you gain more than 3 lbs overnight, or 5 lbs in one week Continue working with the home health team that is involved in your care Continue caring for the sore on your bottom exactly like the doctor and home health nurse have instructed you to-- please ask the home health nurse to make sure you know how to take care of this wound in between their home visits  If you are experiencing a Mental Health or Behavioral Health Crisis or need someone to talk to, please  call the Suicide and Crisis Lifeline: 988 call the [place] National Suicide Prevention Lifeline: 778-660-4060 or TTY: 772-197-0152 TTY (507) 229-3055) to talk to a trained counselor call 1-800-273-TALK (toll free, 24 hour hotline) go to [facility] [address] [place] [phone]) call the [facility] Crisis Line: [phone] call 911   Patient verbalizes understanding of instructions and care plan provided today and agrees to view in MyChart. Active MyChart status and patient understanding of how to access instructions and care plan via MyChart  confirmed with patient.     Caryl Pina, RN, BSN, Media planner  Transitions of Care  [facility] - [facility] [phone]: direct office

## 2023-05-09 ENCOUNTER — Inpatient Hospital Stay: Payer: No Typology Code available for payment source

## 2023-05-11 ENCOUNTER — Encounter: Payer: Self-pay | Admitting: *Deleted

## 2023-05-11 ENCOUNTER — Encounter: Payer: Self-pay | Admitting: Family Medicine

## 2023-05-11 ENCOUNTER — Inpatient Hospital Stay

## 2023-05-11 ENCOUNTER — Inpatient Hospital Stay: Payer: No Typology Code available for payment source

## 2023-05-11 NOTE — Progress Notes (Signed)
 Patient's daughter called with concern about low blood sugars. Patient is frequently having low CBGs in the am, this morning being 48. Amanda Crawford reports Amanda Crawford falling last night, but states it was from a slip, and unrelated.   Reccommended that Amanda Crawford call and speak to the patient's PCP who manages her blood sugar, but did recommend that patient hold all antidiabetic meds until they can speak with Dr Abner Greenspan. Reviewed snacks before bed and having snacks in the bedside table so patient can eat prior to getting up. Message also sent to [name], nutritionist, who has been working with Amanda Crawford to follow back up with patient to provide more nutrition support.   Amanda Crawford requested that I call and speak to patient and review all the same information, which I did.   Oncology Nurse Navigator Documentation     05/11/2023    2:00 PM  Oncology Nurse Navigator Flowsheets  Navigator Follow Up Date: 05/14/2023  Navigator Follow Up Reason: Scan Review  Navigator Location [facility]  Navigator Encounter Type Telephone  Telephone Symptom Mgt;Incoming Call  Patient Visit Type MedOnc  Treatment Phase Active Tx  Barriers/Navigation Needs Coordination of Care;Education  Interventions Education;Psycho-Social Support  Acuity Level 2-Minimal Needs (1-2 Barriers Identified)  Support Groups/Services Friends and Family  Time Spent with Patient 45

## 2023-05-13 NOTE — Assessment & Plan Note (Signed)
 Normal but low normal will continue to monitor

## 2023-05-13 NOTE — Assessment & Plan Note (Signed)
 Encourage heart healthy diet such as MIND or DASH diet, increase exercise, avoid trans fats, simple carbohydrates and processed foods, consider a krill or fish or flaxseed oil cap daily.

## 2023-05-13 NOTE — Assessment & Plan Note (Signed)
 Hydrate and monitor

## 2023-05-13 NOTE — Assessment & Plan Note (Signed)
 Supplement and monitor

## 2023-05-13 NOTE — Assessment & Plan Note (Signed)
 No recent exacerbation

## 2023-05-13 NOTE — Assessment & Plan Note (Signed)
Encouraged DASH diet, decrease po intake and increase exercise as tolerated. Needs 7-8 hours of sleep nightly. Avoid trans fats, eat small, frequent meals every 4-5 hours with lean proteins, complex carbs and healthy fats. Minimize simple carbs 

## 2023-05-13 NOTE — Assessment & Plan Note (Signed)
 hgba1c acceptable, minimize simple carbs. Increase exercise as tolerated. Continue current meds

## 2023-05-14 ENCOUNTER — Inpatient Hospital Stay: Admitting: Medical Oncology

## 2023-05-14 ENCOUNTER — Inpatient Hospital Stay

## 2023-05-14 ENCOUNTER — Encounter (HOSPITAL_COMMUNITY)
Admission: RE | Admit: 2023-05-14 | Discharge: 2023-05-14 | Disposition: A | Discharge status: Home / Self Care | Source: Ambulatory Visit | Attending: Hematology & Oncology | Admitting: Hematology & Oncology

## 2023-05-14 DIAGNOSIS — C342 Malignant neoplasm of middle lobe, bronchus or lung: Secondary | ICD-10-CM | POA: Insufficient documentation

## 2023-05-14 DIAGNOSIS — C3431 Malignant neoplasm of lower lobe, right bronchus or lung: Secondary | ICD-10-CM | POA: Diagnosis not present

## 2023-05-14 LAB — GLUCOSE, CAPILLARY: Glucose-Capillary: 153 mg/dL — ABNORMAL HIGH (ref 70–99)

## 2023-05-14 MED ORDER — FLUDEOXYGLUCOSE F - 18 (FDG) INJECTION
10.0000 | Freq: Once | INTRAVENOUS | Status: AC | PRN
  Administered 2023-05-14: 10.21 via INTRAVENOUS

## 2023-05-15 ENCOUNTER — Telehealth: Admitting: Family Medicine

## 2023-05-15 ENCOUNTER — Inpatient Hospital Stay: Admitting: Dietician

## 2023-05-15 ENCOUNTER — Other Ambulatory Visit: Payer: Self-pay | Admitting: *Deleted

## 2023-05-15 ENCOUNTER — Inpatient Hospital Stay

## 2023-05-15 DIAGNOSIS — M1A9XX0 Chronic gout, unspecified, without tophus (tophi): Secondary | ICD-10-CM

## 2023-05-15 DIAGNOSIS — E119 Type 2 diabetes mellitus without complications: Secondary | ICD-10-CM

## 2023-05-15 DIAGNOSIS — E785 Hyperlipidemia, unspecified: Secondary | ICD-10-CM

## 2023-05-15 DIAGNOSIS — E559 Vitamin D deficiency, unspecified: Secondary | ICD-10-CM

## 2023-05-15 DIAGNOSIS — I5022 Chronic systolic (congestive) heart failure: Secondary | ICD-10-CM

## 2023-05-15 NOTE — Transitions of Care (Post Inpatient/ED Visit) (Signed)
 Care Management  Transitions of Care Program Transitions of Care Post-discharge week # 6; day # 31  05/15/2023 Name: Amanda Crawford MRN: [id number] DOB: 03-03-1945  Subjective: Amanda Crawford is a 78 y.o. year old female who is a primary care patient of Neda Balk, MD. The Care Management team was unable to reach the patient by phone to assess and address transitions of care needs.   Plan: Additional outreach attempts will be made to reach the patient enrolled in the [facility] Program (Post Inpatient Visit).  Pls call/ message for questions,  Joniah Bednarski Mckinney Jezabel Lecker, RN, BSN, CCRN Alumnus RN Care Manager  Transitions of Care  [facility] - [facility] [phone]: direct office

## 2023-05-15 NOTE — Progress Notes (Signed)
 Nutrition Follow Up: Reached out to patient at home telephone number to follow up on reaction to chemo and per RN has been having low blood sugars  spouse was with her.    She reports having C-diff since last nutrition consult and hypotension, and some low blood sugars.  She tests daily and most days running 95-115 in morning.  She also reports Certain foods don't taste good. Meat in particular beef.  Fluids: substituted some water, cranberry apple juice Staying away from breads worried about blood sugars going too.  Fluids: water (3 bottles), Propel, no sugar soft drinks, Ginger ale or lemon lime, Coffee decaf with creamer  Medications: finished (diflucan) 05/04/23  Vit D   Labs: 04/30/23  GFR 34, K+ 4.1, BUN 22, Creat 1.53   Anthropometrics: small fluctuations lately  Height: 64" Weight: 05/08/23  205.5# 04/30/23  209# 04/08/23  212# UBW: 212-215# BMI: 35.27   Estimated Energy Needs  Kcals: 2400 Protein: 76-96 Fluid: 2.5 L   NUTRITION DIAGNOSIS: Food and Nutrition Related Knowledge Deficit related to cancer and associated treatments as evidenced by no prior need for nutrition related information.   INTERVENTION:   Encouraged use of plate method for portioning foods. Encouraged hydration with zero sugar liquids. Encouraged reading labels for carb  content and to try keep carbs 30-45 grams per meal. Emailed Nutrition Tip sheet  for  plate method and carb counting to daughter's email on file with contact information provided.  MONITORING, EVALUATION, GOAL: weight trends, nutrition impact symptoms, PO intake, labs   Next Visit: remote next month  Carleen Chary, RDN, LDN Registered Dietitian, [facility] Part Time Remote (Usual office hours: Tuesday-Thursday) Mobile: [phone]

## 2023-05-16 ENCOUNTER — Inpatient Hospital Stay

## 2023-05-16 ENCOUNTER — Other Ambulatory Visit: Payer: Self-pay

## 2023-05-16 ENCOUNTER — Telehealth: Payer: Self-pay | Admitting: *Deleted

## 2023-05-16 NOTE — Progress Notes (Signed)
 Patient unable to connect.

## 2023-05-16 NOTE — Transitions of Care (Post Inpatient/ED Visit) (Signed)
 Care Management  Transitions of Care Program Transitions of Care Post-discharge week # 6/ day # 32 Unsuccessful consecutive outreach attempt # 2  05/16/2023 Name: Amanda Crawford MRN: [id number] DOB: [birth year]  Subjective: [name] is a 78 y.o. year old female who is a primary care patient of Neda Balk, MD. The Care Management team was unable to reach the patient by phone to assess and address transitions of care needs.   Plan: Additional outreach attempts will be made to reach the patient enrolled in the [facility] Program (Post Inpatient Visit).  Pls call/ message for questions,  Tywone Bembenek Mckinney Aadvik Roker, RN, BSN, CCRN Alumnus RN Care Manager  Transitions of Care  [facility] - [facility] [phone]: direct office

## 2023-05-17 ENCOUNTER — Telehealth: Payer: Self-pay | Admitting: *Deleted

## 2023-05-17 ENCOUNTER — Encounter: Payer: Self-pay | Admitting: *Deleted

## 2023-05-17 DIAGNOSIS — C342 Malignant neoplasm of middle lobe, bronchus or lung: Secondary | ICD-10-CM

## 2023-05-17 NOTE — Progress Notes (Signed)
 Reviewed PET which shows good treatment response.   Oncology Nurse Navigator Documentation     05/17/2023   12:30 PM  Oncology Nurse Navigator Flowsheets  Navigator Follow Up Date: 05/21/2023  Navigator Follow Up Reason: Follow-up Appointment;Chemotherapy  Navigator Location [facility]  Navigator Encounter Type Scan Review  Patient Visit Type MedOnc  Treatment Phase Active Tx  Barriers/Navigation Needs Coordination of Care;Education  Interventions None Required  Acuity Level 2-Minimal Needs (1-2 Barriers Identified)  Support Groups/Services Friends and Family  Time Spent with Patient 15

## 2023-05-17 NOTE — Transitions of Care (Post Inpatient/ED Visit) (Signed)
 Transition of Care  week # 6/ day # 33 - TOC 30-day program case closure  Visit Note  05/17/2023  Name: Amanda Crawford MRN: [id number]          DOB: September 04, 1945  Situation: Patient enrolled in [facility] 30-day program. Visit completed with patient's daughter/ caregiver Amanda Crawford- verified on [facility] DPR by telephone.   Background:  Recent hospitalization March 9-13, 2025 for recurrent diverticulitis/ shock; COVID Recent post-hospital discharge diagnosis of C-Diff Fragile state of health, multiple progressing chronic health conditions  TOC 30--day outreach completed; patient has successfully met/ accomplished her established goals for [facility] 30-day program without hospital readmission and referral was sent for ongoing follow up with longitudinal RN CM   Transition Care Management Follow-up Telephone Call    Past Medical History:  Diagnosis Date   Allergic state 03/12/2015   Arthritis    Cirrhosis (HCC)    Colon polyp 11/19/2014   Dermatitis 03/12/2015   Diarrhea 06/29/2016   Diverticulitis    DM (diabetes mellitus), type 2 (HCC) 06/29/2016   GERD (gastroesophageal reflux disease)    occ   Gout 03/12/2015   H/O measles    H/O mumps    Headache(784.0)    migraines   History of chicken pox 11/23/2014   Hyperglycemia 06/29/2016   Insomnia 02/13/2023   Lung cancer (HCC)    Migraine 11/23/2014   Obesity 11/23/2014   Pneumonia    child   Preventative health care 09/12/2015   Primary hypertension 12/15/2020   Renal cell cancer (HCC)    renal cell ca dx 9/01 and 8/08;   Renal insufficiency    Shortness of breath    occ   Skin lesion of breast 03/12/2015   Sleep apnea 01/24/2023   recently dx 01/24/23 - CPAP ordered by not received yet   Small cell carcinoma of middle lobe of right lung (HCC) 03/14/2023   Small cell lung cancer, right middle lobe (HCC) 03/14/2023   Assessment:  Daughter/ caregiver:  "I appreciate you calling me when you could not reach my Mom- something must be wrong  with her phone settings; she is still having some problems- fluctuating of her blood sugars and her blood pressures; we are keeping Dr. Maria Shiner and Dr. Rodrick Clapper in the loop; Dr. Rodrick Clapper has recommended some adjustments in her blood sugar medicines; she goes back for her chemotherapy next week, so I will keep them all in the loop; please do have the next nurse to call and stay in touch with her."    Caregiver denies specific clinical concerns today, reports patient continues taking medications as prescribed- she reports adjustment of blood sugar medications per PCP recommendation but is unable to tell me specific details- she is not currently with patient   Patient Reported Symptoms: Cognitive Cognitive Status: Alert and oriented to person, place, and time, Normal speech and language skills (per caregiver - TOC call completed with caregiver)      Neurological      HEENT HEENT Symptoms Reported: No symptoms reported, Not assessed (not indicated)      Cardiovascular Cardiovascular Symptoms Reported: Fatigue, Other: Other Cardiovascular Symptoms: caregiver- daughter reports intermittent "blood pressures that seem lower than usual;" Caregiver is not present with patient to provide specific home monitoring values Does patient have uncontrolled Hypertension?: No Cardiovascular Conditions: Hypertension Cardiovascular Management Strategies: Medication therapy, Weight management, Routine screening, Diet modification, Fluid modification Do You Have a Working Readable Scale?: Yes Weight:  (careiver is not with patient to provide specific home monitoring  values- encouraged caregiver to ensure patient continues to monitor/ record daily weights at home)  Respiratory Respiratory Symptoms Reported: No symptoms reported Other Respiratory Symptoms: Caregiver denies that patient has had recent episodes of shortness of breath Respiratory Conditions: Shortness of breath  Endocrine Patient reports the following symptoms  related to hypoglycemia or hyperglycemia : Irritability, Weakness or fatigue, Other Other symptoms related to hypoglycemia or hyperglycemia: Caregiver reports that "recently blood sugars seem to be fluctuating between higher and then lower values;" confirms that she has informed PCP of same:  insulin adjustment as indicated recommended: daughter is not present with patient to provide specific parameters- encouraged ongoing home monitoring/ recording, close contact with PCP along with in-person office visit should this continue; referral to longitudinal RN CM Is patient diabetic?: Yes Is patient checking blood sugars at home?: Yes Endocrine Conditions: Diabetes Endocrine Management Strategies: Medication therapy, Routine screening, Medical device, Diet modification, Coping strategies, Adequate rest  Gastrointestinal Gastrointestinal Symptoms Reported: No symptoms reported Additional Gastrointestinal Details: Caregiver reports intermittent decreased appetite Gastrointestinal Conditions: Diarrhea (history of diarrhea) Gastrointestinal Management Strategies: Medication therapy, Diet modification, Adequate rest, Coping strategies Nutrition Risk Screen (CP): No indicators present  Genitourinary      Integumentary Integumentary Symptoms Reported: Skin changes Other Integumentary Symptoms: per caregiver:  "the rash she reported a few weeks ago is a little bit better; Dr. Rodrick Clapper is aware and has recommended no changes, still trying to figure out what might be causing it- she has adjusted some of her blood sugar medications thinking that might be the cause;" Caregiver not currently with patient to provide specific changes: recommended ongoing close follow up with PCP/ oncology providers; referral to longitudinal RN CM Additional Integumentary Details: Caregiver reports previously reported "redness" on sacrum, "is better as far as I know" Skin Management Strategies: Adequate rest, Medication therapy, Routine  screening, Coping strategies  Musculoskeletal Musculoskelatal Symptoms Reviewed: No symptoms reported Additional Musculoskeletal Details: Caregiver reports patient still not requiring use of assistive devices Musculoskeletal Management Strategies: Routine screening      Psychosocial Psychosocial Symptoms Reported: No symptoms reported, Not assessed (TOC call completed with caregiver- unable to assess)         There were no vitals filed for this visit.  Medications Reviewed Today     Reviewed by Pravin Perezperez M, RN (Registered Nurse) on 05/17/23 at 1208  Med List Status: <None>   Medication Order Taking? Sig Documenting Provider Last Dose Status Informant  allopurinol (ZYLOPRIM) 300 MG tablet [id number] No TAKE 1 TABLET BY MOUTH EVERY DAY Neda Balk, MD Taking Active Self, Child  aspirin EC 81 MG tablet [id number] No Take 81 mg by mouth daily. [provider] Taking Active Self, Child  atorvastatin (LIPITOR) 20 MG tablet [id number] No Take 1 tablet (20 mg total) by mouth daily. Neda Balk, MD Taking Active Self, Child  carvedilol (COREG) 3.125 MG tablet [id number] No Take 0.5 tablets (1.5625 mg total) by mouth 2 (two) times daily with a meal. Byrum, Delora Ferry, MD Taking Active Self, Child  dexamethasone (DECADRON) 4 MG tablet [id number] No Take 2 tablets daily for 2 days, on days 4 and 5.Take with food. Every 21 days. Ivor Mars, MD Taking Active Self, Child           Med Note Evern Hocking Apr 08, 2023 11:30 AM) Patient is to take after chemotherapy. Should have been taken Feb 27 and 28 but patient thought it was PRN med and did not take. Next  chemo starts March 17 so patient should take March 20 and 21.  diphenhydramine-acetaminophen (TYLENOL PM) 25-500 MG TABS [id number] No Take 1 tablet by mouth at bedtime as needed (sleep). [provider] Taking Active Self, Child           Med Note Acquanetta Acre, Evangelina Hilt Apr 08, 2023 11:17 AM)    ferrous sulfate 325  (65 FE) MG tablet [id number] No Take 325 mg by mouth at bedtime. [provider] Taking Active Self, Child  fluconazole (DIFLUCAN) 100 MG tablet [id number] No Take 1 tablet (100 mg total) by mouth daily. Ivor Mars, MD Taking Active   furosemide (LASIX) 40 MG tablet [id number] No TAKE 1 TABLET BY MOUTH EVERY DAY Hilty, Aviva Lemmings, MD Taking Active Self, Child           Med Note (Jazmene Racz M   Fri Apr 13, 2023  3:01 PM) 04/13/23: Caregiver reports during [facility] call good understanding to restart on 04/14/23:  advised her to discuss with PCP during HFU office visit on 04/16/23- whether or not to adjust/ modify   glimepiride (AMARYL) 2 MG tablet [id number] No TAKE 1 TABLET BY MOUTH EVERY DAY WITH BREAKFAST Neda Balk, MD Taking Active Self, Child  glucose blood (ONETOUCH VERIO) test strip [id number] No Check Blood Sugar 3 times daily. Neda Balk, MD Taking Active Self, Child  Discontinued 03/25/19 1548            Med Note Valda Garnet   Wed Jan 10, 2023 10:26 AM) Pt is not taking.  hyoscyamine (LEVSIN SL) 0.125 MG SL tablet [id number] No Place 1 tablet (0.125 mg total) under the tongue every 6 (six) hours as needed. Neda Balk, MD Taking Active   insulin glargine (LANTUS) 100 unit/mL SOPN [id number] No Inject 14 Units into the skin at bedtime. 04/26/23: Reports during TOC 30-day program call: she is currently taking this insulin 14 U at bedtime: reports has not started Semglee insulin because she still had Lantus at home to use- per PCP advice Neda Balk, MD Taking Active Self           Med Note (Tavaris Eudy M   Thu Apr 26, 2023  1:27 PM) 04/26/23: Reports during TOC 30-day program call: she is currently taking this insulin 14 U at bedtime: reports has not started Semglee insulin because she still had Lantus at home to use- per PCP advice   Insulin Pen Needle (BD PEN NEEDLE NANO 2ND GEN) 32G X 4 MM MISC [id number] No USE AS DIRECTED WITH LANTUS ONCE A DAY. Neda Balk, MD Taking Active Self, Child  irbesartan (AVAPRO) 75 MG tablet [id number] No TAKE 1 TABLET BY MOUTH EVERY DAY Neda Balk, MD Taking Active   Lancets [facility] DELICA PLUS Roy) MISC [id number] No USE AS DIRECTED TO TEST DAILY Neda Balk, MD Taking Active Self, Child  meclizine (ANTIVERT) 12.5 MG tablet [id number] No Take 1 tablet (12.5 mg total) by mouth 3 (three) times daily as needed for dizziness. As needed for   Dizziness or nausea Neda Balk, MD Taking Active Self, Child           Med Note Evern Hocking Apr 08, 2023 11:19 AM)    nystatin cream (MYCOSTATIN) [id number] No Apply 1 application topically 2 (two) times daily.  Patient taking differently: Apply 1 application  topically 2 (two) times daily as needed (rash).   Neda Balk, MD  Taking Active Self, Child           Med Note Evern Hocking Apr 08, 2023 11:19 AM)    ondansetron (ZOFRAN) 8 MG tablet [id number] No Take 1 tablet (8 mg total) by mouth every 8 (eight) hours as needed for nausea or vomiting. Start on third day after chemotherapy. Ivor Mars, MD Taking Active Self, Child  pantoprazole (PROTONIX) 40 MG tablet [id number] No Take 1 tablet (40 mg total) by mouth daily. Rosena Conradi, MD Taking Expired 05/13/23 2359   prochlorperazine (COMPAZINE) 10 MG tablet [id number] No Take 1 tablet (10 mg total) by mouth every 6 (six) hours as needed for nausea or vomiting (Nausea or vomiting). Ivor Mars, MD Taking Active Self, Child  SEMGLEE, YFGN, 100 UNIT/ML Pen [id number] No Inject 14 Units into the skin at bedtime. [provider] Taking Active Self, Child           Med Note (Jenny Lai M   Thu Apr 26, 2023  4:50 PM) 04/26/23: Reports during TOC 30-day program call: she is currently NOT taking this insulin-- states she was told to finish taking what she had at home prior to starting:  Reports currently taking Lantus Insulin 14 U at bedtime: confirms has not started Semglee insulin  because she still had Lantus at home to use- per PCP advice "whenever she first changed it"  vancomycin (VANCOCIN) 125 MG capsule [id number] No Take 125 mg by mouth 4 (four) times daily. [provider] Taking Active            Med Note (Tranell Wojtkiewicz M   Thu May 03, 2023  1:53 PM) 05/03/23:  Reports during [facility] 30-day program that she has completed 10-day course but outpatient pharmacy gave her 56 pills; she reports she has continued taking "all" past the prescribed 10-day course- given that oncology provider just prescribed new antibiotic on 04/30/23, to start on 05/04/23-- as well as recent C-Diff diagnosis- I recommended that she clarify with oncology provider on 05/04/23 visit what she should/ should not take- patient and   Vitamin D, Ergocalciferol, (DRISDOL) 1.25 MG (50000 UNIT) CAPS capsule [id number] No TAKE 1 CAPSULE (50,000 UNITS TOTAL) BY MOUTH EVERY 7 (SEVEN) DAYS. Webb, Padonda B, FNP Taking Active Self, Child           Med Note Evern Hocking Apr 08, 2023 11:21 AM) Sunday  zolpidem (AMBIEN) 5 MG tablet [id number] No Take 1 tablet (5 mg total) by mouth at bedtime. Margaretann Sharper, MD Taking Active            Recommendation:   Referral to: longitudinal RN CM  Follow Up Plan:   Referral to RN Case Manager  Total time spent from review to signing of note/ including any care coordination interventions:  50 minutes  Pls call/ message for questions,  Gaile Allmon Mckinney Thao Vanover, RN, BSN, CCRN Alumnus RN Care Manager  Transitions of Care  [facility] - [facility] [phone]: direct office

## 2023-05-17 NOTE — Patient Instructions (Signed)
 Visit Information  Thank you for taking time to visit with me today. Please don't hesitate to contact me if I can be of assistance to you before our next scheduled telephone appointment.  It has been a pleasure working with you over the last 30 days!  Please listen for a call from the new nurse care manager who will pick up in your care where we are leaving off today  Following are the goals we discussed today:  Patient Goals/Self-Care Activities: Take all medications as prescribed Attend all scheduled provider appointments Call provider office for new concerns or questions  Weigh yourself every day to stay on top of early fluid retention: write down your weights every day so you remember what it is from day to day: follow the weight-gain guidelines and action plan to call your doctor if you gain more than 3 lbs overnight, or 5 lbs in one week Continue working with the home health team that is involved in your care Continue monitoring and recording your blood sugars at home and let your PCP know if you have concerns with blood sugar management at home If you believe your condition is getting worse- contact your care providers (doctors) promptly- reaching out to your doctor early when you have concerns can prevent you from having to go to the hospital  If you are experiencing a Mental Health or Behavioral Health Crisis or need someone to talk to, please  call the Suicide and Crisis Lifeline: 988 call the [place]  National Suicide Prevention Lifeline: (214)572-4112 or TTY: 502-713-8040 TTY 716 778 6700) to talk to a trained counselor call 1-800-273-TALK (toll free, 24 hour hotline) go to [facility] [address] [place] [phone]) call the [facility] Crisis Line: [phone] call 911   Caregiver verbalizes understanding of instructions and care plan provided today and agrees to view in MyChart. Active MyChart status and patient understanding  of how to access instructions and care plan via MyChart confirmed with caregiver/ daughter.      Glennda Weatherholtz Mckinney Makela Niehoff, RN, BSN, Media planner  Transitions of Care  [facility] - [facility] [phone]: direct office

## 2023-05-18 ENCOUNTER — Other Ambulatory Visit: Payer: Self-pay | Admitting: Family Medicine

## 2023-05-18 ENCOUNTER — Inpatient Hospital Stay

## 2023-05-20 NOTE — Assessment & Plan Note (Deleted)
 Normal but low normal will continue to monitor

## 2023-05-20 NOTE — Assessment & Plan Note (Deleted)
 Hydrate and monitor

## 2023-05-20 NOTE — Assessment & Plan Note (Deleted)
 Encourage heart healthy diet such as MIND or DASH diet, increase exercise, avoid trans fats, simple carbohydrates and processed foods, consider a krill or fish or flaxseed oil cap daily.

## 2023-05-20 NOTE — Assessment & Plan Note (Deleted)
 [id number] acceptable, minimize simple carbs. Increase exercise as tolerated. Continue current meds

## 2023-05-21 ENCOUNTER — Inpatient Hospital Stay

## 2023-05-21 ENCOUNTER — Encounter: Payer: Self-pay | Admitting: Hematology & Oncology

## 2023-05-21 ENCOUNTER — Ambulatory Visit: Payer: No Typology Code available for payment source | Admitting: Family Medicine

## 2023-05-21 ENCOUNTER — Inpatient Hospital Stay (HOSPITAL_BASED_OUTPATIENT_CLINIC_OR_DEPARTMENT_OTHER): Admitting: Hematology & Oncology

## 2023-05-21 ENCOUNTER — Other Ambulatory Visit: Payer: Self-pay

## 2023-05-21 ENCOUNTER — Other Ambulatory Visit: Payer: Self-pay | Admitting: Family Medicine

## 2023-05-21 ENCOUNTER — Encounter: Payer: Self-pay | Admitting: *Deleted

## 2023-05-21 VITALS — BP 112/50 | HR 101 | Temp 98.3°F | Resp 18

## 2023-05-21 DIAGNOSIS — C342 Malignant neoplasm of middle lobe, bronchus or lung: Secondary | ICD-10-CM

## 2023-05-21 DIAGNOSIS — Z5111 Encounter for antineoplastic chemotherapy: Secondary | ICD-10-CM | POA: Diagnosis not present

## 2023-05-21 DIAGNOSIS — E119 Type 2 diabetes mellitus without complications: Secondary | ICD-10-CM | POA: Diagnosis not present

## 2023-05-21 DIAGNOSIS — M1A9XX0 Chronic gout, unspecified, without tophus (tophi): Secondary | ICD-10-CM

## 2023-05-21 DIAGNOSIS — E785 Hyperlipidemia, unspecified: Secondary | ICD-10-CM

## 2023-05-21 LAB — CBC WITH DIFFERENTIAL (CANCER CENTER ONLY)
Abs Immature Granulocytes: 0.37 10*3/uL — ABNORMAL HIGH (ref 0.00–0.07)
Basophils Absolute: 0.2 10*3/uL — ABNORMAL HIGH (ref 0.0–0.1)
Basophils Relative: 1 %
Eosinophils Absolute: 0.1 10*3/uL (ref 0.0–0.5)
Eosinophils Relative: 0 %
HCT: 27.6 % — ABNORMAL LOW (ref 36.0–46.0)
Hemoglobin: 8.8 g/dL — ABNORMAL LOW (ref 12.0–15.0)
Immature Granulocytes: 3 %
Lymphocytes Relative: 11 %
Lymphs Abs: 1.7 10*3/uL (ref 0.7–4.0)
MCH: 33.7 pg (ref 26.0–34.0)
MCHC: 31.9 g/dL (ref 30.0–36.0)
MCV: 105.7 fL — ABNORMAL HIGH (ref 80.0–100.0)
Monocytes Absolute: 0.9 10*3/uL (ref 0.1–1.0)
Monocytes Relative: 6 %
Neutro Abs: 11.4 10*3/uL — ABNORMAL HIGH (ref 1.7–7.7)
Neutrophils Relative %: 79 %
Platelet Count: 246 10*3/uL (ref 150–400)
RBC: 2.61 MIL/uL — ABNORMAL LOW (ref 3.87–5.11)
RDW: 17.4 % — ABNORMAL HIGH (ref 11.5–15.5)
WBC Count: 14.5 10*3/uL — ABNORMAL HIGH (ref 4.0–10.5)
nRBC: 0.1 % (ref 0.0–0.2)

## 2023-05-21 LAB — CMP (CANCER CENTER ONLY)
ALT: 34 U/L (ref 0–44)
AST: 48 U/L — ABNORMAL HIGH (ref 15–41)
Albumin: 3.5 g/dL (ref 3.5–5.0)
Alkaline Phosphatase: 135 U/L — ABNORMAL HIGH (ref 38–126)
Anion gap: 14 (ref 5–15)
BUN: 28 mg/dL — ABNORMAL HIGH (ref 8–23)
CO2: 25 mmol/L (ref 22–32)
Calcium: 10.4 mg/dL — ABNORMAL HIGH (ref 8.9–10.3)
Chloride: 100 mmol/L (ref 98–111)
Creatinine: 1.59 mg/dL — ABNORMAL HIGH (ref 0.44–1.00)
GFR, Estimated: 33 mL/min — ABNORMAL LOW (ref >60)
Glucose, Bld: 242 mg/dL — ABNORMAL HIGH (ref 70–99)
Potassium: 4.4 mmol/L (ref 3.5–5.1)
Sodium: 139 mmol/L (ref 135–145)
Total Bilirubin: 0.7 mg/dL (ref 0.0–1.2)
Total Protein: 6.2 g/dL — ABNORMAL LOW (ref 6.5–8.1)

## 2023-05-21 LAB — LACTATE DEHYDROGENASE: LDH: 243 U/L — ABNORMAL HIGH (ref 98–192)

## 2023-05-21 LAB — SAMPLE TO BLOOD BANK

## 2023-05-21 LAB — PREPARE RBC (CROSSMATCH)

## 2023-05-21 MED ORDER — PALONOSETRON HCL INJECTION 0.25 MG/5ML
0.2500 mg | Freq: Once | INTRAVENOUS | Status: AC
Start: 2023-05-21 — End: 2023-05-21
  Administered 2023-05-21: 0.25 mg via INTRAVENOUS
  Filled 2023-05-21: qty 5

## 2023-05-21 MED ORDER — CARBOPLATIN CHEMO INJECTION 450 MG/45ML
270.0000 mg | Freq: Once | INTRAVENOUS | Status: AC
  Administered 2023-05-21: 270 mg via INTRAVENOUS
  Filled 2023-05-21: qty 27

## 2023-05-21 MED ORDER — HEPARIN SOD (PORK) LOCK FLUSH 100 UNIT/ML IV SOLN
500.0000 [IU] | Freq: Once | INTRAVENOUS | Status: AC | PRN
  Administered 2023-05-21: 500 [IU]

## 2023-05-21 MED ORDER — INSULIN ASPART 100 UNIT/ML IJ SOLN: 10.0000 [IU] | Freq: Once | INTRAMUSCULAR | Status: DC

## 2023-05-21 MED ORDER — DEXAMETHASONE SODIUM PHOSPHATE 10 MG/ML IJ SOLN
10.0000 mg | Freq: Once | INTRAMUSCULAR | Status: AC
  Administered 2023-05-21: 10 mg via INTRAVENOUS
  Filled 2023-05-21: qty 1

## 2023-05-21 MED ORDER — SODIUM CHLORIDE 0.9% FLUSH
10.0000 mL | INTRAVENOUS | Status: DC | PRN
  Administered 2023-05-21: 10 mL

## 2023-05-21 MED ORDER — SODIUM CHLORIDE 0.9 % IV SOLN
80.0000 mg/m2 | Freq: Once | INTRAVENOUS | Status: AC
  Administered 2023-05-21: 168 mg via INTRAVENOUS
  Filled 2023-05-21: qty 8.4

## 2023-05-21 MED ORDER — FOSAPREPITANT DIMEGLUMINE INJECTION 150 MG
150.0000 mg | Freq: Once | INTRAVENOUS | Status: AC
  Administered 2023-05-21: 150 mg via INTRAVENOUS
  Filled 2023-05-21: qty 150

## 2023-05-21 MED ORDER — SODIUM CHLORIDE 0.9 % IV SOLN
1500.0000 mg | Freq: Once | INTRAVENOUS | Status: AC
  Administered 2023-05-21: 1500 mg via INTRAVENOUS
  Filled 2023-05-21: qty 30

## 2023-05-21 MED ORDER — INSULIN ASPART 100 UNIT/ML IJ SOLN
10.0000 [IU] | Freq: Once | INTRAMUSCULAR | Status: AC
  Administered 2023-05-21: 10 [IU] via SUBCUTANEOUS
  Filled 2023-05-21: qty 0.1

## 2023-05-21 MED ORDER — SODIUM CHLORIDE 0.9 % IV SOLN: INTRAVENOUS | Status: DC

## 2023-05-21 NOTE — Patient Instructions (Addendum)
   [facility] - A DEPT OF [facility]  Discharge Instructions: Thank you for choosing [facility] to provide your oncology and hematology care.   If you have a lab appointment with the Cancer Center, please go directly to the Cancer Center and check in at the registration area.  Wear comfortable clothing and clothing appropriate for easy access to any Portacath or PICC line.   We strive to give you quality time with your provider. You may need to reschedule your appointment if you arrive late (15 or more minutes).  Arriving late affects you and other patients whose appointments are after yours.  Also, if you miss three or more appointments without notifying the office, you may be dismissed from the clinic at the provider's discretion.      For prescription refill requests, have your pharmacy contact our office and allow 72 hours for refills to be completed.    Today you received the following chemotherapy and/or immunotherapy agents DURVALUMAB /CARBOPLATIN /ETOPOSIDE        To help prevent nausea and vomiting after your treatment, we encourage you to take your nausea medication as directed.  BELOW ARE SYMPTOMS THAT SHOULD BE REPORTED IMMEDIATELY: *FEVER GREATER THAN 100.4 F (38 C) OR HIGHER *CHILLS OR SWEATING *NAUSEA AND VOMITING THAT IS NOT CONTROLLED WITH YOUR NAUSEA MEDICATION *UNUSUAL SHORTNESS OF BREATH *UNUSUAL BRUISING OR BLEEDING *URINARY PROBLEMS (pain or burning when urinating, or frequent urination) *BOWEL PROBLEMS (unusual diarrhea, constipation, pain near the anus) TENDERNESS IN MOUTH AND THROAT WITH OR WITHOUT PRESENCE OF ULCERS (sore throat, sores in mouth, or a toothache) UNUSUAL RASH, SWELLING OR PAIN  UNUSUAL VAGINAL DISCHARGE OR ITCHING   Items with * indicate a potential emergency and should be followed up as soon as possible or go to the Emergency Department if any problems should occur.  Please show the CHEMOTHERAPY  ALERT CARD or IMMUNOTHERAPY ALERT CARD at check-in to the Emergency Department and triage nurse. Should you have questions after your visit or need to cancel or reschedule your appointment, please contact [facility] - A DEPT OF [facility]  [phone] and follow the prompts.  Office hours are 8:00 a.m. to 4:30 p.m. Monday - Friday. Please note that voicemails left after 4:00 p.m. may not be returned until the following business day.  We are closed weekends and major holidays. You have access to a nurse at all times for urgent questions. Please call the main number to the clinic [phone] and follow the prompts.  For any non-urgent questions, you may also contact your provider using MyChart. We now offer e-Visits for anyone 46 and older to request care online for non-urgent symptoms. For details visit [email or web address].   Also download the MyChart app! Go to the app store, search "MyChart", open the app, select [place], and log in with your MyChart username and password.

## 2023-05-21 NOTE — Progress Notes (Signed)
 Pt. arrived for tmt. Saw Dr. Maria Shiner today BP low  and glucose 242. 10 units Insulin  given. Per MD, inform patient to stop bp med and lasix . Given message in writing and reviewed with pt. Pt. stated that she was supposed to see her PCP but he is sick. Advised patient to call and reschedule to review meds with PCP.  Cret. 1.59 today. Ok to treat per MD. RX notified.

## 2023-05-21 NOTE — Progress Notes (Signed)
 Per Dr. Maria Shiner ok to treat with creatinine 1.59

## 2023-05-21 NOTE — Progress Notes (Signed)
 Ok to treat with Scr 1.59. TSH ordered for 06/11/23.  Jobe Mulder [place], [place], BCPS, BCOP 05/21/2023 12:08 PM

## 2023-05-21 NOTE — Progress Notes (Signed)
 Patient is here for her next cycle. She is very happy with her recent PET scan results. She will need transfusion tomorrow for symptom management in addition to day two. She will proceed with cycle three.   Oncology Nurse Navigator Documentation     05/21/2023   10:15 AM  Oncology Nurse Navigator Flowsheets  Navigator Follow Up Date: 06/11/2023  Navigator Follow Up Reason: Follow-up Appointment;Chemotherapy  Navigator Radiographer, therapeutic Encounter Type Treatment  Patient Visit Type MedOnc  Treatment Phase Active Tx  Barriers/Navigation Needs Coordination of Care;Education  Interventions Psycho-Social Support  Acuity Level 2-Minimal Needs (1-2 Barriers Identified)  Support Groups/Services Friends and Family  Time Spent with Patient 15

## 2023-05-21 NOTE — Patient Instructions (Signed)

## 2023-05-21 NOTE — Progress Notes (Signed)
 Hematology and Oncology Follow Up Visit  Amanda Crawford [id number] 11/16/1945 78 y.o. 05/21/2023   Principle Diagnosis:  Small cell lung cancer -- Amanda Crawford stage Renal cell carcinoma -- Clear cell  Current Therapy:   S/p LEFT nephrectomy - 2001 Carbo/VP-16/Durvalumab  -- s/p cycle #2- start on 03/26/2023     Interim History:  Amanda Crawford is back for follow-up.  She has had a very nice response to chemotherapy.  We did do a PET scan on her.  This was done on 05/14/2023.  PET scan showed pretty much complete resolution of the abnormalities in the chest.  She just had a remaining subcarinal lymph node measuring 9 mm.  She also had a right hilar lymph node which measured 1.7 cm.  Right lower lobe nodule also decreased in size.  She had increased sclerosis of left iliac bone.  Is very happy that she has responded.  She does feel tired.  She might need to be transfused.  We will give her 1 unit of blood.  Her hemoglobin was 8.8.  She does not complain of any pain.  She is having no problems with nausea or vomiting.  She is having no problems with cough.  She is having no rashes.  Has been no leg swelling.  Overall, I would have to say that her performance status is probably ECOG 1.    Medications:  Current Outpatient Medications:    allopurinol  (ZYLOPRIM ) 300 MG tablet, TAKE 1 TABLET BY MOUTH EVERY DAY, Disp: 90 tablet, Rfl: 1   aspirin  EC 81 MG tablet, Take 81 mg by mouth daily., Disp: , Rfl:    atorvastatin  (LIPITOR) 20 MG tablet, TAKE 1 TABLET BY MOUTH EVERY DAY, Disp: 90 tablet, Rfl: 1   carvedilol  (COREG ) 3.125 MG tablet, Take 0.5 tablets (1.5625 mg total) by mouth 2 (two) times daily with a meal., Disp: , Rfl:    dexamethasone  (DECADRON ) 4 MG tablet, Take 2 tablets daily for 2 days, on days 4 and 5.Take with food. Every 21 days., Disp: 30 tablet, Rfl: 1   diphenhydramine -acetaminophen  (TYLENOL  PM) 25-500 MG TABS, Take 1 tablet by mouth at bedtime as needed (sleep)., Disp: , Rfl:     ferrous sulfate 325 (65 FE) MG tablet, Take 325 mg by mouth at bedtime., Disp: , Rfl:    fluconazole  (DIFLUCAN ) 100 MG tablet, Take 1 tablet (100 mg total) by mouth daily., Disp: 30 tablet, Rfl: 3   furosemide  (LASIX ) 40 MG tablet, TAKE 1 TABLET BY MOUTH EVERY DAY, Disp: 90 tablet, Rfl: 1   glimepiride  (AMARYL ) 2 MG tablet, TAKE 1 TABLET BY MOUTH EVERY DAY WITH BREAKFAST, Disp: 90 tablet, Rfl: 1   glucose blood (ONETOUCH VERIO) test strip, Check Blood Sugar 3 times daily., Disp: 100 strip, Rfl: 4   hyoscyamine  (LEVSIN SL) 0.125 MG SL tablet, Place 1 tablet (0.125 mg total) under the tongue every 6 (six) hours as needed., Disp: 30 tablet, Rfl: 1   insulin  glargine (LANTUS ) 100 unit/mL SOPN, Inject 14 Units into the skin at bedtime. 04/26/23: Reports during TOC 30-day program call: she is currently taking this insulin  14 U at bedtime: reports has not started Semglee insulin  because she still had Lantus  at home to use- per PCP advice, Disp: , Rfl:    Insulin  Pen Needle (BD PEN NEEDLE NANO 2ND GEN) 32G X 4 MM MISC, USE AS DIRECTED WITH LANTUS  ONCE A DAY., Disp: 100 each, Rfl: 3   irbesartan  (AVAPRO ) 75 MG tablet, TAKE 1 TABLET BY MOUTH  EVERY DAY, Disp: 90 tablet, Rfl: 3   Lancets (ONETOUCH DELICA PLUS LANCET33G) MISC, USE AS DIRECTED TO TEST DAILY, Disp: 100 each, Rfl: 12   meclizine  (ANTIVERT ) 12.5 MG tablet, Take 1 tablet (12.5 mg total) by mouth 3 (three) times daily as needed for dizziness. As needed for   Dizziness or nausea, Disp: 30 tablet, Rfl: 1   nystatin  cream (MYCOSTATIN ), Apply 1 application topically 2 (two) times daily. (Patient taking differently: Apply 1 application  topically 2 (two) times daily as needed (rash).), Disp: 30 g, Rfl: 1   ondansetron  (ZOFRAN ) 8 MG tablet, Take 1 tablet (8 mg total) by mouth every 8 (eight) hours as needed for nausea or vomiting. Start on third day after chemotherapy., Disp: 30 tablet, Rfl: 1   pantoprazole  (PROTONIX ) 40 MG tablet, Take 1 tablet (40 mg total)  by mouth daily., Disp: 30 tablet, Rfl: 0   prochlorperazine  (COMPAZINE ) 10 MG tablet, Take 1 tablet (10 mg total) by mouth every 6 (six) hours as needed for nausea or vomiting (Nausea or vomiting)., Disp: 30 tablet, Rfl: 1   SEMGLEE, YFGN, 100 UNIT/ML Pen, Inject 14 Units into the skin at bedtime., Disp: , Rfl:    vancomycin  (VANCOCIN ) 125 MG capsule, Take 125 mg by mouth 4 (four) times daily., Disp: , Rfl:    Vitamin D , Ergocalciferol , (DRISDOL ) 1.25 MG (50000 UNIT) CAPS capsule, TAKE 1 CAPSULE (50,000 UNITS TOTAL) BY MOUTH EVERY 7 (SEVEN) DAYS., Disp: 5 capsule, Rfl: 4   zolpidem  (AMBIEN ) 5 MG tablet, Take 1 tablet (5 mg total) by mouth at bedtime., Disp: 30 tablet, Rfl: 1  Allergies:  Allergies  Allergen Reactions   Bactrim  Ds [Sulfamethoxazole -Trimethoprim ] Rash    Past Medical History, Surgical history, Social history, and Family History were reviewed and updated.  Review of Systems: Review of Systems  Constitutional: Negative.   HENT:  Negative.    Eyes: Negative.   Respiratory: Negative.    Cardiovascular: Negative.   Gastrointestinal: Negative.   Endocrine: Negative.   Genitourinary: Negative.    Musculoskeletal: Negative.   Skin: Negative.   Neurological: Negative.   Hematological: Negative.   Psychiatric/Behavioral: Negative.      Physical Exam: Vital signs Crawford temperature of 97.6.  Pulse 100.  Blood pressure 95/48.  Weight is 200 pounds.   Wt Readings from Last 3 Encounters:  05/08/23 205 lb 8 oz (93.2 kg)  04/30/23 209 lb (94.8 kg)  04/16/23 213 lb 3.2 oz (96.7 kg)    Physical Exam Vitals reviewed.  HENT:     Head: Normocephalic and atraumatic.  Eyes:     Pupils: Pupils are equal, round, and reactive to light.  Cardiovascular:     Rate and Rhythm: Normal rate and regular rhythm.     Heart sounds: Normal heart sounds.  Pulmonary:     Effort: Pulmonary effort is normal.     Breath sounds: Normal breath sounds.     Comments: Pulmonary exam shows good  breath sounds bilaterally.  I hear no wheezing.  She has good air movement bilaterally. Abdominal:     General: Bowel sounds are normal.     Palpations: Abdomen is soft.     Comments: Abdominal exam shows well-healed laparotomy scar left side of the abdomen.  There is no fluid wave.  There is no palpable liver or spleen tip.  Musculoskeletal:        General: No tenderness or deformity. Normal range of motion.     Cervical back: Normal range of motion.  Lymphadenopathy:     Cervical: No cervical adenopathy.  Skin:    General: Skin is warm and dry.     Findings: No erythema or rash.  Neurological:     Mental Status: She is alert and oriented to person, place, and time.  Psychiatric:        Behavior: Behavior normal.        Thought Content: Thought content normal.        Judgment: Judgment normal.     Lab Results  Component Value Date   WBC 14.5 (H) 05/21/2023   HGB 8.8 (L) 05/21/2023   HCT 27.6 (L) 05/21/2023   MCV 105.7 (H) 05/21/2023   PLT 246 05/21/2023     Chemistry      Component Value Date/Time   NA 139 05/21/2023 1003   NA 139 08/23/2017 0933   NA 140 11/01/2016 0818   K 4.4 05/21/2023 1003   K 4.4 11/01/2016 0818   CL 100 05/21/2023 1003   CL 107 04/16/2012 0923   CO2 25 05/21/2023 1003   CO2 26 11/01/2016 0818   BUN 28 (H) 05/21/2023 1003   BUN 24 08/23/2017 0933   BUN 17.9 11/01/2016 0818   CREATININE 1.59 (H) 05/21/2023 1003   CREATININE 1.1 11/01/2016 0818   GLU 151 (H) 09/30/2019 1014      Component Value Date/Time   CALCIUM  10.4 (H) 05/21/2023 1003   CALCIUM  10.3 11/01/2016 0818   ALKPHOS 135 (H) 05/21/2023 1003   ALKPHOS 72 11/01/2016 0818   AST 48 (H) 05/21/2023 1003   AST 75 (H) 11/01/2016 0818   ALT 34 05/21/2023 1003   ALT 75 (H) 11/01/2016 0818   BILITOT 0.7 05/21/2023 1003   BILITOT 0.78 11/01/2016 0818       Impression and Plan: Ms. Durango is a very charming 78 year old white female.  She is doing quite nicely.  She has responded  which is nice to see.   Again, I think she will benefit from a blood transfusion.  I think we need to make an adjustment with her high blood pressure medications.  I would stop her Avapro .  I would also stop her Lasix .  We still need to consider radiotherapy to the mediastinum after chemotherapy in my opinion.  I think this is where the bulk of her disease is.  I would think that she probably would have recurrence in this area down the road.  We will go ahead with her third cycle of treatment.  I will probably give her 6 cycles and then we can see how she does.  Again should be transfused.    I will see her back in 3 weeks.   Ivor Mars, MD 4/21/202511:28 AM

## 2023-05-22 ENCOUNTER — Telehealth: Payer: Self-pay | Admitting: *Deleted

## 2023-05-22 ENCOUNTER — Other Ambulatory Visit: Payer: Self-pay | Admitting: Family Medicine

## 2023-05-22 ENCOUNTER — Inpatient Hospital Stay

## 2023-05-22 VITALS — BP 102/52 | HR 101 | Temp 97.7°F | Resp 20

## 2023-05-22 DIAGNOSIS — Z5111 Encounter for antineoplastic chemotherapy: Secondary | ICD-10-CM | POA: Diagnosis not present

## 2023-05-22 DIAGNOSIS — C342 Malignant neoplasm of middle lobe, bronchus or lung: Secondary | ICD-10-CM

## 2023-05-22 MED ORDER — DIPHENHYDRAMINE HCL 25 MG PO CAPS
25.0000 mg | ORAL_CAPSULE | Freq: Once | ORAL | Status: DC
Start: 2023-05-22 — End: 2023-05-22

## 2023-05-22 MED ORDER — ACETAMINOPHEN 325 MG PO TABS: 650.0000 mg | ORAL_TABLET | Freq: Once | ORAL | Status: DC

## 2023-05-22 MED ORDER — SODIUM CHLORIDE 0.9 % IV SOLN: INTRAVENOUS | Status: DC

## 2023-05-22 MED ORDER — SODIUM CHLORIDE 0.9% FLUSH: 10.0000 mL | INTRAVENOUS | Status: DC | PRN

## 2023-05-22 MED ORDER — SODIUM CHLORIDE 0.9% FLUSH
10.0000 mL | INTRAVENOUS | Status: DC | PRN
  Administered 2023-05-22: 10 mL

## 2023-05-22 MED ORDER — HEPARIN SOD (PORK) LOCK FLUSH 100 UNIT/ML IV SOLN
500.0000 [IU] | Freq: Once | INTRAVENOUS | Status: AC | PRN
  Administered 2023-05-22: 500 [IU]

## 2023-05-22 MED ORDER — HEPARIN SOD (PORK) LOCK FLUSH 100 UNIT/ML IV SOLN: 500.0000 [IU] | Freq: Every day | INTRAVENOUS | Status: DC | PRN

## 2023-05-22 MED ORDER — DEXAMETHASONE SODIUM PHOSPHATE 10 MG/ML IJ SOLN
10.0000 mg | Freq: Once | INTRAMUSCULAR | Status: AC
  Administered 2023-05-22: 10 mg via INTRAVENOUS
  Filled 2023-05-22: qty 1

## 2023-05-22 MED ORDER — SODIUM CHLORIDE 0.9% IV SOLUTION
250.0000 mL | INTRAVENOUS | Status: DC
Start: 2023-05-22 — End: 2023-05-22
  Administered 2023-05-22: 250 mL via INTRAVENOUS

## 2023-05-22 MED ORDER — SODIUM CHLORIDE 0.9 % IV SOLN
80.0000 mg/m2 | Freq: Once | INTRAVENOUS | Status: AC
  Administered 2023-05-22: 168 mg via INTRAVENOUS
  Filled 2023-05-22: qty 8.4

## 2023-05-22 NOTE — Telephone Encounter (Signed)
 ATC [facility] office) to see if we were tagged in this patient's airview account since I cannot pull a download on her.  I received a message that thte wait time was 20 minutes.  I called Ashly to see if he could assist with making sure we are tagged in her account since there was a long wait time with [facility]'s main number.

## 2023-05-22 NOTE — Telephone Encounter (Signed)
 Copied from CRM [id number]. Topic: Clinical - Medication Refill >> May 22, 2023  9:44 AM Clydene Darner H wrote: Most Recent Primary Care Visit:  Provider: [facility]  Department[facility]  Visit Type: LAB VISIT  Date: 04/17/2023  Medication: glucose blood (ONETOUCH VERIO) test strip  Has the patient contacted their pharmacy? Yes (Agent: If no, request that the patient contact the pharmacy for the refill. If patient does not wish to contact the pharmacy document the reason why and proceed with request.) (Agent: If yes, when and what did the pharmacy advise?)  Is this the correct pharmacy for this prescription? Yes If no, delete pharmacy and type the correct one.  This is the patient's preferred pharmacy:  [id number] - [place], [place] - [address] [place] [address] Phone: [phone] Fax: [phone]  Has the prescription been filled recently? Yes  Is the patient out of the medication? Yes  Has the patient been seen for an appointment in the last year OR does the patient have an upcoming appointment? No  Can we respond through MyChart? No  Agent: Please be advised that Rx refills may take up to 3 business days. We ask that you follow-up with your pharmacy.

## 2023-05-22 NOTE — Telephone Encounter (Signed)
 Atc patient x1, left detailed message for patient to find out if she is wearing her CPAP machine.  Advised to call back.

## 2023-05-22 NOTE — Patient Instructions (Signed)
 [facility]  Discharge Instructions: Thank you for choosing [facility] to provide your oncology and hematology care.   If you have a lab appointment with the Cancer Center, please go directly to the Cancer Center and check in at the registration area.  Wear comfortable clothing and clothing appropriate for easy access to any Portacath or PICC line.   We strive to give you quality time with your provider. You may need to reschedule your appointment if you arrive late (15 or more minutes).  Arriving late affects you and other patients whose appointments are after yours.  Also, if you miss three or more appointments without notifying the office, you may be dismissed from the clinic at the provider's discretion.      For prescription refill requests, have your pharmacy contact our office and allow 72 hours for refills to be completed.    Today you received the following chemotherapy and/or immunotherapy agents Etoposide       To help prevent nausea and vomiting after your treatment, we encourage you to take your nausea medication as directed.  BELOW ARE SYMPTOMS THAT SHOULD BE REPORTED IMMEDIATELY: *FEVER GREATER THAN 100.4 F (38 C) OR HIGHER *CHILLS OR SWEATING *NAUSEA AND VOMITING THAT IS NOT CONTROLLED WITH YOUR NAUSEA MEDICATION *UNUSUAL SHORTNESS OF BREATH *UNUSUAL BRUISING OR BLEEDING *URINARY PROBLEMS (pain or burning when urinating, or frequent urination) *BOWEL PROBLEMS (unusual diarrhea, constipation, pain near the anus) TENDERNESS IN MOUTH AND THROAT WITH OR WITHOUT PRESENCE OF ULCERS (sore throat, sores in mouth, or a toothache) UNUSUAL RASH, SWELLING OR PAIN  UNUSUAL VAGINAL DISCHARGE OR ITCHING   Items with * indicate a potential emergency and should be followed up as soon as possible or go to the Emergency Department if any problems should occur.  Please show the CHEMOTHERAPY ALERT CARD or IMMUNOTHERAPY  ALERT CARD at check-in to the Emergency Department and triage nurse. Should you have questions after your visit or need to cancel or reschedule your appointment, please contact [facility] - A DEPT OF [facility]  [phone] and follow the prompts.  Office hours are 8:00 a.m. to 4:30 p.m. Monday - Friday. Please note that voicemails left after 4:00 p.m. may not be returned until the following business day.  We are closed weekends and major holidays. You have access to a nurse at all times for urgent questions. Please call the main number to the clinic [phone] and follow the prompts.  For any non-urgent questions, you may also contact your provider using MyChart. We now offer e-Visits for anyone 13 and older to request care online for non-urgent symptoms. For details visit [email or web address].   Also download the MyChart app! Go to the app store, search "MyChart", open the app, select [place], and log in with your MyChart username and password.

## 2023-05-22 NOTE — Progress Notes (Signed)
 Ok to treat with slightly elevated HR.  Jobe Mulder [place], [facility] 05/21/2023

## 2023-05-22 NOTE — Telephone Encounter (Signed)
 Called and spoke with [facility], they stated that they were not in network for her insurance.  Called and spoke with Brad with [employer], he states the order was voided back in January as her insurance was not in network with [employer].  She was told to call her insurance company to find out who is in network for her insurance and then call the office when she gets the name of the company.    Message sent to [facility] to find out what company is in network for her insurance company.  Patient currently does not have a CPAP machine.

## 2023-05-22 NOTE — Telephone Encounter (Signed)
Rx already sent today

## 2023-05-23 ENCOUNTER — Inpatient Hospital Stay: Admitting: Licensed Clinical Social Worker

## 2023-05-23 ENCOUNTER — Ambulatory Visit: Payer: No Typology Code available for payment source | Admitting: Pulmonary Disease

## 2023-05-23 ENCOUNTER — Inpatient Hospital Stay

## 2023-05-23 ENCOUNTER — Encounter: Payer: Self-pay | Admitting: Hematology & Oncology

## 2023-05-23 VITALS — BP 123/73 | HR 91 | Temp 98.0°F | Resp 18

## 2023-05-23 DIAGNOSIS — C342 Malignant neoplasm of middle lobe, bronchus or lung: Secondary | ICD-10-CM

## 2023-05-23 DIAGNOSIS — Z5111 Encounter for antineoplastic chemotherapy: Secondary | ICD-10-CM | POA: Diagnosis not present

## 2023-05-23 LAB — TYPE AND SCREEN
ABO/RH(D): A POS
Antibody Screen: NEGATIVE
Unit division: 0

## 2023-05-23 LAB — BPAM RBC
Blood Product Expiration Date: 202505182359
ISSUE DATE / TIME: 202504220721
Unit Type and Rh: 202505182359
Unit Type and Rh: 6200

## 2023-05-23 MED ORDER — SODIUM CHLORIDE 0.9 % IV SOLN
80.0000 mg/m2 | Freq: Once | INTRAVENOUS | Status: AC
  Administered 2023-05-23: 168 mg via INTRAVENOUS
  Filled 2023-05-23: qty 8.4

## 2023-05-23 MED ORDER — SODIUM CHLORIDE 0.9 % IV SOLN: INTRAVENOUS | Status: DC

## 2023-05-23 MED ORDER — HEPARIN SOD (PORK) LOCK FLUSH 100 UNIT/ML IV SOLN
500.0000 [IU] | Freq: Once | INTRAVENOUS | Status: AC | PRN
  Administered 2023-05-23: 500 [IU]

## 2023-05-23 MED ORDER — SODIUM CHLORIDE 0.9% FLUSH
10.0000 mL | INTRAVENOUS | Status: DC | PRN
  Administered 2023-05-23: 10 mL

## 2023-05-23 MED ORDER — DEXAMETHASONE SODIUM PHOSPHATE 10 MG/ML IJ SOLN
10.0000 mg | Freq: Once | INTRAMUSCULAR | Status: AC
  Administered 2023-05-23: 10 mg via INTRAVENOUS
  Filled 2023-05-23: qty 1

## 2023-05-23 NOTE — Progress Notes (Signed)
 [facility] CSW Progress Note  Visual merchandiser met with patient and her husband in infusion to offer active listening and supportive counseling.  Discussed financial concerns.  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  She would like to utilize it.  She expressed no other needs at this time.    Kennth Peal, LCSW Clinical Social Worker [facility]

## 2023-05-23 NOTE — Patient Instructions (Addendum)
 [facility] - A DEPT OF [facility]  Discharge Instructions: Thank you for choosing [facility] to provide your oncology and hematology care.   If you have a lab appointment with the Cancer Center, please go directly to the Cancer Center and check in at the registration area.  Wear comfortable clothing and clothing appropriate for easy access to any Portacath or PICC line.   We strive to give you quality time with your provider. You may need to reschedule your appointment if you arrive late (15 or more minutes).  Arriving late affects you and other patients whose appointments are after yours.  Also, if you miss three or more appointments without notifying the office, you may be dismissed from the clinic at the provider's discretion.      For prescription refill requests, have your pharmacy contact our office and allow 72 hours for refills to be completed.    Today you received the following chemotherapy and/or immunotherapy agents:  Etoposide       To help prevent nausea and vomiting after your treatment, we encourage you to take your nausea medication as directed.  BELOW ARE SYMPTOMS THAT SHOULD BE REPORTED IMMEDIATELY: *FEVER GREATER THAN 100.4 F (38 C) OR HIGHER *CHILLS OR SWEATING *NAUSEA AND VOMITING THAT IS NOT CONTROLLED WITH YOUR NAUSEA MEDICATION *UNUSUAL SHORTNESS OF BREATH *UNUSUAL BRUISING OR BLEEDING *URINARY PROBLEMS (pain or burning when urinating, or frequent urination) *BOWEL PROBLEMS (unusual diarrhea, constipation, pain near the anus) TENDERNESS IN MOUTH AND THROAT WITH OR WITHOUT PRESENCE OF ULCERS (sore throat, sores in mouth, or a toothache) UNUSUAL RASH, SWELLING OR PAIN  UNUSUAL VAGINAL DISCHARGE OR ITCHING   Items with * indicate a potential emergency and should be followed up as soon as possible or go to the Emergency Department if any problems should occur.  Please show the CHEMOTHERAPY ALERT CARD or IMMUNOTHERAPY  ALERT CARD at check-in to the Emergency Department and triage nurse. Should you have questions after your visit or need to cancel or reschedule your appointment, please contact [facility] - A DEPT OF [facility]  [phone] and follow the prompts.  Office hours are 8:00 a.m. to 4:30 p.m. Monday - Friday. Please note that voicemails left after 4:00 p.m. may not be returned until the following business day.  We are closed weekends and major holidays. You have access to a nurse at all times for urgent questions. Please call the main number to the clinic [phone] and follow the prompts.  For any non-urgent questions, you may also contact your provider using MyChart. We now offer e-Visits for anyone 61 and older to request care online for non-urgent symptoms. For details visit [email or web address].   Also download the MyChart app! Go to the app store, search "MyChart", open the app, select [place], and log in with your MyChart username and password.  Carbohydrate Counting for Diabetes Mellitus, Adult Carbohydrate counting is a method of keeping track of how many carbohydrates you eat. Eating carbohydrates increases the amount of sugar (glucose) in the blood. Counting how many carbohydrates you eat improves how well you manage your blood glucose. This, in turn, helps you manage your diabetes. Carbohydrates are measured in grams (g) per serving. It is important to know how many carbohydrates (in grams or by serving size) you can have in each meal. This is different for every person. A dietitian can help you make a meal plan and calculate how  many carbohydrates you should have at each meal and snack. What foods contain carbohydrates? Carbohydrates are found in the following foods: Grains, such as breads and cereals. Dried beans and soy products. Starchy vegetables, such as potatoes, peas, and corn. Fruit and fruit juices. Milk and yogurt. Sweets and snack foods,  such as cake, cookies, candy, chips, and soft drinks. How do I count carbohydrates in foods? There are two ways to count carbohydrates in food. You can read food labels or learn standard serving sizes of foods. You can use either of these methods or a combination of both. Using the Nutrition Facts label The Nutrition Facts list is included on the labels of almost all packaged foods and beverages in the [place] . It includes: The serving size. Information about nutrients in each serving, including the grams of carbohydrate per serving. To use the Nutrition Facts, decide how many servings you will have. Then, multiply the number of servings by the number of carbohydrates per serving. The resulting number is the total grams of carbohydrates that you will be having. Learning the standard serving sizes of foods When you eat carbohydrate foods that are not packaged or do not include Nutrition Facts on the label, you need to measure the servings in order to count the grams of carbohydrates. Measure the foods that you will eat with a food scale or measuring cup, if needed. Decide how many standard-size servings you will eat. Multiply the number of servings by 15. For foods that contain carbohydrates, one serving equals 15 g of carbohydrates. For example, if you eat 2 cups or 10 oz (300 g) of strawberries, you will have eaten 2 servings and 30 g of carbohydrates (2 servings x 15 g = 30 g). For foods that have more than one food mixed, such as soups and casseroles, you must count the carbohydrates in each food that is included. The following list contains standard serving sizes of common carbohydrate-rich foods. Each of these servings has about 15 g of carbohydrates: 1 slice of bread. 1 six-inch (15 cm) tortilla. ? cup or 2 oz (53 g) cooked rice or pasta.  cup or 3 oz (85 g) cooked or canned, drained and rinsed beans or lentils.  cup or 3 oz (85 g) starchy vegetable, such as peas, corn, or  squash.  cup or 4 oz (120 g) hot cereal.  cup or 3 oz (85 g) boiled or mashed potatoes, or  or 3 oz (85 g) of a large baked potato.  cup or 4 fl oz (118 mL) fruit juice. 1 cup or 8 fl oz (237 mL) milk. 1 small or 4 oz (106 g) apple.  or 2 oz (63 g) of a medium banana. 1 cup or 5 oz (150 g) strawberries. 3 cups or 1 oz (28.3 g) popped popcorn. What is an example of carbohydrate counting? To calculate the grams of carbohydrates in this sample meal, follow the steps shown below. Sample meal 3 oz (85 g) chicken breast. ? cup or 4 oz (106 g) brown rice.  cup or 3 oz (85 g) corn. 1 cup or 8 fl oz (237 mL) milk. 1 cup or 5 oz (150 g) strawberries with sugar-free whipped topping. Carbohydrate calculation Identify the foods that contain carbohydrates: Rice. Corn. Milk. Strawberries. Calculate how many servings you have of each food: 2 servings rice. 1 serving corn. 1 serving milk. 1 serving strawberries. Multiply each number of servings by 15 g: 2 servings rice x 15 g = 30 g.  1 serving corn x 15 g = 15 g. 1 serving milk x 15 g = 15 g. 1 serving strawberries x 15 g = 15 g. Add together all of the amounts to find the total grams of carbohydrates eaten: 30 g + 15 g + 15 g + 15 g = 75 g of carbohydrates total. What are tips for following this plan? Shopping Develop a meal plan and then make a shopping list. Buy fresh and frozen vegetables, fresh and frozen fruit, dairy, eggs, beans, lentils, and whole grains. Look at food labels. Choose foods that have more fiber and less sugar. Avoid processed foods and foods with added sugars. Meal planning Aim to have the same number of grams of carbohydrates at each meal and for each snack time. Plan to have regular, balanced meals and snacks. Where to find more information American Diabetes Association: diabetes.org Centers for Disease Control and Prevention: TonerPromos.no Academy of Nutrition and Dietetics: eatright.org Association of  Diabetes Care & Education Specialists: diabeteseducator.org Summary Carbohydrate counting is a method of keeping track of how many carbohydrates you eat. Eating carbohydrates increases the amount of sugar (glucose) in your blood. Counting how many carbohydrates you eat improves how well you manage your blood glucose. This helps you manage your diabetes. A dietitian can help you make a meal plan and calculate how many carbohydrates you should have at each meal and snack. This information is not intended to replace advice given to you by your health care provider. Make sure you discuss any questions you have with your health care provider. Document Revised: 08/19/2019 Document Reviewed: 08/20/2019 Elsevier Patient Education  2024 [employer].

## 2023-05-24 ENCOUNTER — Encounter: Payer: Self-pay | Admitting: Family Medicine

## 2023-05-24 ENCOUNTER — Ambulatory Visit (INDEPENDENT_AMBULATORY_CARE_PROVIDER_SITE_OTHER): Admitting: Family Medicine

## 2023-05-24 ENCOUNTER — Telehealth: Payer: Self-pay | Admitting: *Deleted

## 2023-05-24 VITALS — BP 130/58 | HR 96 | Ht 64.0 in | Wt 205.0 lb

## 2023-05-24 DIAGNOSIS — I10 Essential (primary) hypertension: Secondary | ICD-10-CM | POA: Diagnosis not present

## 2023-05-24 DIAGNOSIS — Z794 Long term (current) use of insulin: Secondary | ICD-10-CM | POA: Diagnosis not present

## 2023-05-24 DIAGNOSIS — E119 Type 2 diabetes mellitus without complications: Secondary | ICD-10-CM

## 2023-05-24 DIAGNOSIS — Z7984 Long term (current) use of oral hypoglycemic drugs: Secondary | ICD-10-CM

## 2023-05-24 NOTE — Progress Notes (Signed)
 Acute Office Visit  Subjective:     Patient ID: Amanda Crawford, female    DOB: 1945/07/17, 78 y.o.   MRN: [id number]  Chief Complaint  Patient presents with   Medical Management of Chronic Issues    HPI Patient is in today for blood pressure concerns.   Discussed the use of AI scribe software for clinical note transcription with the patient, who gave verbal consent to proceed.  History of Present Illness Amanda Crawford is an 78 year old female with diabetes and hypertension who presents with blood sugar and blood pressure concerns following recent chemotherapy treatment.  She has been experiencing fluctuations in her blood sugar levels since her hospitalization in March, where her blood sugar dropped to 38, resulting in a five-day hospital stay, including a day and a half in the ICU. Since then, her blood sugar has been high, reaching the 200s, whereas it previously stayed around 120-130. She is unsure about her insulin  and glimepiride  dosing. She is currently taking 14 units of insulin  at bedtime but did not restart the glimepiride .   She is undergoing chemotherapy for cancer, receiving three-hour infusions on Monday, Tuesday, and Wednesday, followed by a chemotherapy shot on Friday. This is her third round of treatment, and she experienced significant health issues during the first week, including diverticulitis, a UTI, pneumonia, and a viral infection.   Her blood pressure has also been a concern, with recent episodes of low readings. She has never had blood pressure issues before. Her blood pressure medications, irbesartan  and Lasix , were recently stopped by oncologist, and her blood pressure has since stabilized to the 110s and 130s. She continues to take carvedilol  for blood pressure and heart rate management.  She is managing her diet carefully, focusing on protein intake and avoiding high-sugar foods. She is concerned about her diet's impact on her blood sugar levels and is  seeking guidance on appropriate food choices.     ROS All review of systems negative except what is listed in the HPI      Objective:    BP (!) 130/58   Pulse 96   Ht 5\' 4"  (1.626 m)   Wt 205 lb (93 kg)   SpO2 97%   BMI 35.19 kg/m    Physical Exam Vitals reviewed.  Constitutional:      General: She is not in acute distress.    Appearance: Normal appearance. She is obese.  Cardiovascular:     Rate and Rhythm: Normal rate and regular rhythm.  Pulmonary:     Effort: Pulmonary effort is normal.     Breath sounds: Normal breath sounds.  Skin:    General: Skin is warm and dry.  Neurological:     Mental Status: She is alert and oriented to person, place, and time.  Psychiatric:        Mood and Affect: Mood normal.        Behavior: Behavior normal.        Thought Content: Thought content normal.        Judgment: Judgment normal.     No results found for any visits on 05/24/23.      Assessment & Plan:   Problem List Items Addressed This Visit       Active Problems   DM (diabetes mellitus), type 2 (HCC) - Primary   Blood sugar levels elevated recently. Patient concerned about diet and insulin  dosing. - Restart glimepiride  2 mg at breakfast. - Continue insulin  (Lantus ) 14 units at  bedtime. - Monitor blood sugar levels daily - Provide list of foods to avoid. - Advise to call office if unsure about insulin  dosing.      Primary hypertension   BP has stabilized since stopping irbesartan  and lasix . No new symptoms. Continue carvedilol  and monitor BP regularly.             No orders of the defined types were placed in this encounter.   Return in about 3 months (around 08/23/2023) for routine follow-up.  Everlina Hock, NP

## 2023-05-24 NOTE — Assessment & Plan Note (Signed)
 Blood sugar levels elevated recently. Patient concerned about diet and insulin  dosing. - Restart glimepiride  2 mg at breakfast. - Continue insulin  (Lantus ) 14 units at bedtime. - Monitor blood sugar levels daily - Provide list of foods to avoid. - Advise to call office if unsure about insulin  dosing.

## 2023-05-24 NOTE — Assessment & Plan Note (Signed)
 BP has stabilized since stopping irbesartan  and lasix . No new symptoms. Continue carvedilol  and monitor BP regularly.

## 2023-05-24 NOTE — Telephone Encounter (Signed)
 Copied from CRM [id number]. Topic: General - Other >> May 24, 2023 11:18 AM Ambrose Junk wrote: Reason for CRM: Ashly from [place] returning a call to [employer]. Please call at [phone]  Already handled.  Spoke with Polly Brink at [place].

## 2023-05-25 ENCOUNTER — Inpatient Hospital Stay

## 2023-05-25 VITALS — BP 143/87 | HR 87 | Temp 98.1°F | Resp 18

## 2023-05-25 DIAGNOSIS — C342 Malignant neoplasm of middle lobe, bronchus or lung: Secondary | ICD-10-CM

## 2023-05-25 DIAGNOSIS — Z5111 Encounter for antineoplastic chemotherapy: Secondary | ICD-10-CM | POA: Diagnosis not present

## 2023-05-25 MED ORDER — PEGFILGRASTIM-JMDB 6 MG/0.6ML ~~LOC~~ SOSY
6.0000 mg | PREFILLED_SYRINGE | Freq: Once | SUBCUTANEOUS | Status: AC
  Administered 2023-05-25: 6 mg via SUBCUTANEOUS
  Filled 2023-05-25: qty 0.6

## 2023-05-25 NOTE — Patient Instructions (Signed)

## 2023-05-28 ENCOUNTER — Other Ambulatory Visit: Payer: Self-pay

## 2023-05-28 NOTE — Patient Outreach (Signed)
 Complex Care Management   Visit Note  05/28/2023  Name:  Amanda Crawford MRN: [id number] DOB: [birth year]  Situation: Referral received for Complex Care Management related to Diabetes with Complications and diverticulitis  I obtained verbal consent from Patient.  Visit completed with Amanda Crawford  on the phone  Background:   Past Medical History:  Diagnosis Date   Allergic state 03/12/2015   Arthritis    Cirrhosis (HCC)    Colon polyp 11/19/2014   Dermatitis 03/12/2015   Diarrhea 06/29/2016   Diverticulitis    DM (diabetes mellitus), type 2 (HCC) 06/29/2016   GERD (gastroesophageal reflux disease)    occ   Gout 03/12/2015   H/O measles    H/O mumps    Headache(784.0)    migraines   History of chicken pox 11/23/2014   Hyperglycemia 06/29/2016   Insomnia 02/13/2023   Lung cancer (HCC)    Migraine 11/23/2014   Obesity 11/23/2014   Pneumonia    child   Preventative health care 09/12/2015   Primary hypertension 12/15/2020   Renal cell cancer (HCC)    renal cell ca dx 9/01 and 8/08;   Renal insufficiency    Shortness of breath    occ   Skin lesion of breast 03/12/2015   Sleep apnea 01/24/2023   recently dx 01/24/23 - CPAP ordered by not received yet   Small cell carcinoma of middle lobe of right lung (HCC) 03/14/2023   Small cell lung cancer, right middle lobe (HCC) 03/14/2023    Assessment: Patient Reported Symptoms:  Cognitive Cognitive Status: Alert and oriented to person, place, and time, Normal speech and language skills, Other: (Noted patient difficulty finding the right word several times throughout our conversation. Requires extra time but responds appropriately.) Cognitive/Intellectual Conditions Management [RPT]: None reported or documented in medical history or problem list      Neurological Neurological Review of Symptoms: Dizziness, Weakness (With standing/activity) Neurological Management Strategies: Adequate rest  HEENT HEENT Symptoms Reported: No  symptoms reported HEENT Conditions: Vision problem(s) Vision Problems: blindness/vision loss HEENT Management Strategies: Medical device (Eyeglasses) Vision problem(s)  Cardiovascular Cardiovascular Symptoms Reported: Dizziness, Lightheadness (With standing/activity) Does patient have uncontrolled Hypertension?: No Cardiovascular Conditions: Heart failure, High blood cholesterol, Hypertension, Coronary artery disease Cardiovascular Management Strategies: Medication therapy, Medical device Do You Have a Working Readable Scale?: Yes Weight: 206 lb (93.4 kg) (Patient reported, taken on 05/15/23) Cardiovascular Comment: Patient denies checking weight daily  Respiratory Respiratory Symptoms Reported: Shortness of breath Additional Respiratory Details: Patient reports surgery on L lung twice. Chronic shortness of breath with exertion, at baseline per patient. Patient is currently undergoing chemo infusions 3x/week for R lung cancer. She reports a chemo "shot" every Friday. Respiratory Conditions: Sleep disordered breathing (Per chart review pulmonology is working to get patient a CPAP. She states she is waiting to hear back from them.)  Endocrine Patient reports the following symptoms related to hypoglycemia or hyperglycemia : Weakness or fatigue Is patient diabetic?: Yes Is patient checking blood sugars at home?: Yes Endocrine Conditions: Diabetes Endocrine Management Strategies: Medication therapy, Diet modification, Medical device, Fluid modification Endocrine Comment: Patient reports having options handy to treat hypoglycemia  Gastrointestinal Gastrointestinal Symptoms Reported: Other, Diarrhea (Patient reports 1 episode of diarrhea last night. No episodes of diarrhea today.) Other Gastrointestinal Symptoms: Patient reports food does not taste as good which has effected her intake. She tries to supplement with 1 Glucerna shake a day. Patieint reports she just recently started the  Glucerna. Gastrointestinal Conditions: Other, Fecal incontinence (  Patient reports fecal incontinence "more than occasionally" especially during periods of diarrhea) Other Gastrointestinal Conditions: Diverticulitis Gastrointestinal Management Strategies: Diet modification, Incontinence garment/pad, Fluid modification Nutrition Risk Screen (CP): No indicators present  Genitourinary Genitourinary Symptoms Reported: Incontinence (Occasional urge incontinence) Genitourinary Conditions: Other Other Genitourinary Conditions: History of kidney CA per chart review Genitourinary Management Strategies: Incontinence garment/pad, Fluid modification  Integumentary Integumentary Symptoms Reported: No symptoms reported Additional Integumentary Details: Per patient redness on sacrum and rash are "figured out."    Musculoskeletal Musculoskelatal Symptoms Reviewed: Weakness Musculoskeletal Conditions: Other, Back pain, Osteopenia (Chronic back pain per chart review. Patient reports pain comes and goes.) Other Musculoskeletal Conditions: Gout Musculoskeletal Management Strategies: Routine screening, Exercise (Walking in the house) Falls in the past year?: Yes Number of falls in past year: 1 or less Was there an injury with Fall?: No Fall Risk Category Calculator: 1 Patient Fall Risk Level: Low Fall Risk Patient at Risk for Falls Due to: History of fall(s), Medication side effect Fall risk Follow up: Falls evaluation completed, Education provided, Falls prevention discussed  Psychosocial Psychosocial Symptoms Reported: No symptoms reported     Quality of Family Relationships: helpful, stressful, supportive Do you feel physically threatened by others?: No      12/04/2022   11:25 AM  Depression screen PHQ 2/9  Decreased Interest 0  Down, Depressed, Hopeless 0  PHQ - 2 Score 0    Vitals:   05/28/23 1320  BP: 94/60  Pulse: 69    Medications Reviewed Today     Reviewed by Valaria Garland, RN  (Registered Nurse) on 05/28/23 at 1349  Med List Status: <None>   Medication Order Taking? Sig Documenting Provider Last Dose Status Informant  allopurinol  (ZYLOPRIM ) 300 MG tablet [id number]  TAKE 1 TABLET BY MOUTH EVERY DAY Neda Balk, MD  Active Self, Child  aspirin  EC 81 MG tablet [id number]  Take 81 mg by mouth daily. [provider]  Active Self, Child  atorvastatin  (LIPITOR) 20 MG tablet [id number]  TAKE 1 TABLET BY MOUTH EVERY DAY Neda Balk, MD  Active   carvedilol  (COREG ) 3.125 MG tablet [id number]  Take 0.5 tablets (1.5625 mg total) by mouth 2 (two) times daily with a meal. Denson Flake, MD  Active Self, Child  dexamethasone  (DECADRON ) 4 MG tablet [id number]  Take 2 tablets daily for 2 days, on days 4 and 5.Take with food. Every 21 days. Ivor Mars, MD  Active Self, Child           Med Note Evern Hocking Apr 08, 2023 11:30 AM) Patient is to take after chemotherapy. Should have been taken Feb 27 and 28 but patient thought it was PRN med and did not take. Next chemo starts March 17 so patient should take March 20 and 21.  diphenhydramine -acetaminophen  (TYLENOL  PM) 25-500 MG TABS [id number]  Take 1 tablet by mouth at bedtime as needed (sleep). [provider]  Active Self, Child           Med Note Acquanetta Acre, Evangelina Hilt Apr 08, 2023 11:17 AM)    ferrous sulfate 325 (65 FE) MG tablet [id number]  Take 325 mg by mouth at bedtime. [provider]  Active Self, Child  fluconazole  (DIFLUCAN ) 100 MG tablet [id number]  Take 1 tablet (100 mg total) by mouth daily. Ivor Mars, MD  Active   glimepiride  (AMARYL ) 2 MG tablet [id number]  TAKE 1 TABLET BY MOUTH EVERY DAY WITH BREAKFAST Neda Balk,  MD  Active   glucose blood (ONETOUCH VERIO) test strip [id number] Yes CHECK BLOOD SUGAR DAILY AS NEEDED Neda Balk, MD Taking Active     Discontinued 03/25/19 1548            Med Note Valda Garnet   Wed Jan 10, 2023 10:26 AM) Pt is not taking.   hyoscyamine  (LEVSIN SL) 0.125 MG SL tablet [id number]  Place 1 tablet (0.125 mg total) under the tongue every 6 (six) hours as needed. Neda Balk, MD  Active   insulin  glargine (LANTUS ) 100 unit/mL SOPN [id number]  Inject 14 Units into the skin at bedtime. 04/26/23: Reports during TOC 30-day program call: she is currently taking this insulin  14 U at bedtime: reports has not started Semglee insulin  because she still had Lantus  at home to use- per PCP advice Neda Balk, MD  Active Self           Med Note (TOUSEY, LAINE M   Thu Apr 26, 2023  1:27 PM) 04/26/23: Reports during TOC 30-day program call: she is currently taking this insulin  14 U at bedtime: reports has not started Semglee insulin  because she still had Lantus  at home to use- per PCP advice   Insulin  Pen Needle (BD PEN NEEDLE NANO 2ND GEN) 32G X 4 MM MISC [id number] Yes USE AS DIRECTED WITH LANTUS  ONCE A DAY. Neda Balk, MD Taking Active Self, Child  Lancets [facility] DELICA PLUS [place]) MISC [id number] Yes USE AS DIRECTED TO TEST DAILY Neda Balk, MD Taking Active Self, Child  meclizine  (ANTIVERT ) 12.5 MG tablet [id number]  Take 1 tablet (12.5 mg total) by mouth 3 (three) times daily as needed for dizziness. As needed for   Dizziness or nausea Neda Balk, MD  Active Self, Child           Med Note Evern Hocking Apr 08, 2023 11:19 AM)    nystatin  cream (MYCOSTATIN ) [id number]  Apply 1 application topically 2 (two) times daily.  Patient taking differently: Apply 1 application  topically 2 (two) times daily as needed (rash).   Neda Balk, MD  Active Self, Child           Med Note Acquanetta Acre, Evangelina Hilt Apr 08, 2023 11:19 AM)    ondansetron  (ZOFRAN ) 8 MG tablet [id number]  Take 1 tablet (8 mg total) by mouth every 8 (eight) hours as needed for nausea or vomiting. Start on third day after chemotherapy. Ivor Mars, MD  Active Self, Child  pantoprazole  (PROTONIX ) 40 MG tablet [id number]  Take 1 tablet (40 mg  total) by mouth daily. Rosena Conradi, MD  Expired 05/13/23 2359   prochlorperazine  (COMPAZINE ) 10 MG tablet [id number]  Take 1 tablet (10 mg total) by mouth every 6 (six) hours as needed for nausea or vomiting (Nausea or vomiting). Ivor Mars, MD  Active Self, Child  SEMGLEE, YFGN, 100 UNIT/ML Pen [id number]  Inject 14 Units into the skin at bedtime. [provider]  Active Self, Child           Med Note (TOUSEY, LAINE M   Thu Apr 26, 2023  4:50 PM) 04/26/23: Reports during TOC 30-day program call: she is currently NOT taking this insulin -- states she was told to finish taking what she had at home prior to starting:  Reports currently taking Lantus  Insulin  14 U at bedtime: confirms has not started Semglee insulin  because she still had Lantus  at home to use-  per PCP advice "whenever she first changed it"  vancomycin  (VANCOCIN ) 125 MG capsule [id number]  Take 125 mg by mouth 4 (four) times daily. [provider]  Active            Med Note (TOUSEY, LAINE M   Thu May 03, 2023  1:53 PM) 05/03/23:  Reports during [facility] 30-day program that she has completed 10-day course but outpatient pharmacy gave her 56 pills; she reports she has continued taking "all" past the prescribed 10-day course- given that oncology provider just prescribed new antibiotic on 04/30/23, to start on 05/04/23-- as well as recent C-Diff diagnosis- I recommended that she clarify with oncology provider on 05/04/23 visit what she should/ should not take- patient and   Vitamin D , Ergocalciferol , (DRISDOL ) 1.25 MG (50000 UNIT) CAPS capsule [id number]  TAKE 1 CAPSULE (50,000 UNITS TOTAL) BY MOUTH EVERY 7 (SEVEN) DAYS. Webb, Padonda B, FNP  Active Self, Child           Med Note Evern Hocking Apr 08, 2023 11:21 AM) Sunday  zolpidem  (AMBIEN ) 5 MG tablet [id number]  Take 1 tablet (5 mg total) by mouth at bedtime. Margaretann Sharper, MD  Active             Recommendation:   Continue to check blood sugar as advised by  provider. Follow the "rule of 15" to treat hypoglycemia and report these episodes to your provider. Report continued episodes of diarrhea to your provider.  Follow Up Plan:   Telephone follow up appointment date/time:  06/12/23 at 9:30 AM  Theodora Fish, RN MSN [place]  [facility] RN Care Manager Direct Dial: [phone]  Fax: [phone]

## 2023-05-28 NOTE — Patient Instructions (Signed)
 Visit Information  Thank you for taking time to visit with me today. Please don't hesitate to contact me if I can be of assistance to you before our next scheduled appointment.  Our next appointment is by telephone on 06/12/23 at 9:30 AM with Clarnce Crow. Please call the care guide team at [phone] if you need to cancel or reschedule your appointment.   Following is a copy of your care plan:   Goals Addressed             This Visit's Progress    [facility] RN Care Plan   On track    Problems:  Chronic Disease Management support and education needs related to DMII and diverticulitis  Goal: Over the next 30 days the Patient will attend all scheduled medical appointments: oncology/chemo infusions as evidenced by completed visit notes uploaded to EMR        demonstrate a decrease diverticulitis in exacerbations as evidenced by regular, formed bowel movements demonstrate Ongoing adherence to prescribed treatment plan for DMII as evidenced by medication compliance demonstrate Improved health management independence as evidenced by stable blood sugar readings without hypoglycemic events        take all medications exactly as prescribed and will call provider for medication related questions as evidenced by medication adherence     Interventions:   Diabetes Interventions: Assessed patient's understanding of A1c goal: <7% Reviewed medications with patient and discussed importance of medication adherence Discussed plans with patient for ongoing care management follow up and provided patient with direct contact information for care management team Provided patient with written educational materials related to hypo and hyperglycemia and importance of correct treatment Advised patient, providing education and rationale, to check cbg as advised by provider and record, calling primary provider for findings outside established parameters Discussed the "rule of 15" in treating low blood sugar Lab  Results  Component Value Date   HGBA1C 7.6 (H) 02/08/2023    Evaluation of current treatment plan related to  diverticulitis ,  self-management and patient's adherence to plan as established by provider. Discussed plans with patient for ongoing care management follow up and provided patient with direct contact information for care management team Advised patient to avoid or limit foods that cause changes in bowel movements Advised patient to discuss ongoing issues with diarrhea with provider  Patient Self-Care Activities:  Attend all scheduled provider appointments Call provider office for new concerns or questions  Perform all self care activities independently  Take medications as prescribed   check blood sugar at prescribed times: twice daily take the blood sugar log to all doctor visits Change positions slowly due to reported low blood pressure and dizziness with standing/activity Check weight daily and monitor for new or increased swelling  Plan:  Telephone follow up appointment with care management team member scheduled for:  06/12/23 at 9:30 AM with Clarnce Crow             Please call the Suicide and Crisis Lifeline: 988 call 1-800-273-TALK (toll free, 24 hour hotline) if you are experiencing a Mental Health or Behavioral Health Crisis or need someone to talk to.  Patient verbalizes understanding of instructions and care plan provided today and agrees to view in MyChart. Active MyChart status and patient understanding of how to access instructions and care plan via MyChart confirmed with patient.     Theodora Fish, RN MSN [place]  [facility] RN Care Manager Direct Dial: [phone]  Fax: [phone]  Diverticulitis  Diverticulitis  is when small pouches in your colon get infected or swollen. This causes pain in your belly (abdomen) and watery poop (diarrhea). The small pouches are called diverticula. They may form if you have a condition  called diverticulosis. What are the causes? You may get this condition if poop (stool) gets trapped in the pouches in your colon. The poop lets germs (bacteria) grow. This causes an infection. What increases the risk? You are more likely to get this condition if you have small pouches in your colon. You are also more likely to get it if: You are overweight or very overweight (obese). You do not exercise enough. You drink alcohol. You smoke. You eat a lot of red meat, like beef, pork, or lamb. You do not eat enough fiber. You are older than 80 years of age. What are the signs or symptoms? Pain in your belly. Pain is often on the left side, but it may be felt in other spots too. Fever and chills. Feeling like you may vomit. Vomiting. Having cramps. Feeling full. Changes in how often you poop. Blood in your poop. How is this treated? Most cases are treated at home. You may be told to: Take over-the-counter pain medicines. Only eat and drink clear liquids. Take antibiotics. Rest. Very bad cases may need to be treated at a hospital. Treatment may include: Not eating or drinking. Taking pain medicines. Getting antibiotics through an IV tube. Getting fluid and food through an IV tube. Having surgery. When you are feeling better, you may need to have a test to look at your colon (colonoscopy). Follow these instructions at home: Medicines Take over-the-counter and prescription medicines only as told by your doctor. These include: Fiber pills. Probiotics. Medicines to make your poop soft (stool softeners). If you were prescribed antibiotics, take them as told by your doctor. Do not stop taking them even if you start to feel better. Ask your doctor if you should avoid driving or using machines while you are taking your medicine. Eating and drinking  Follow the diet told by your doctor. You may need to only eat and drink liquids. When you feel better, you may be able to eat more  foods. You may also be told to eat a lot of fiber. Fiber helps you poop. Foods with fiber include berries, beans, lentils, and green vegetables. Try not to eat red meat. General instructions Do not smoke or use any products that contain nicotine or tobacco. If you need help quitting, ask your doctor. Exercise 3 or more times a week. Try to go for 30 minutes each time. Exercise enough to sweat and make your heart beat faster. Contact a doctor if: Your pain gets worse. You are not pooping like normal. Your symptoms do not get better. Your symptoms get worse very fast. You have a fever. You vomit more than one time. You have poop that is: Bloody. Black. Tarry. This information is not intended to replace advice given to you by your health care provider. Make sure you discuss any questions you have with your health care provider. Document Revised: 10/13/2021 Document Reviewed: 10/13/2021 Elsevier Patient Education  2024 [employer].

## 2023-05-29 ENCOUNTER — Emergency Department (HOSPITAL_BASED_OUTPATIENT_CLINIC_OR_DEPARTMENT_OTHER)

## 2023-05-29 ENCOUNTER — Other Ambulatory Visit: Payer: Self-pay

## 2023-05-29 ENCOUNTER — Encounter (HOSPITAL_BASED_OUTPATIENT_CLINIC_OR_DEPARTMENT_OTHER): Payer: Self-pay | Admitting: Emergency Medicine

## 2023-05-29 ENCOUNTER — Inpatient Hospital Stay (HOSPITAL_BASED_OUTPATIENT_CLINIC_OR_DEPARTMENT_OTHER)
Admission: EM | Admit: 2023-05-29 | Discharge: 2023-07-01 | DRG: 871 | Disposition: E | Attending: Internal Medicine | Admitting: Internal Medicine

## 2023-05-29 ENCOUNTER — Telehealth: Payer: Self-pay

## 2023-05-29 DIAGNOSIS — Z85118 Personal history of other malignant neoplasm of bronchus and lung: Secondary | ICD-10-CM

## 2023-05-29 DIAGNOSIS — N1330 Unspecified hydronephrosis: Secondary | ICD-10-CM | POA: Diagnosis not present

## 2023-05-29 DIAGNOSIS — R590 Localized enlarged lymph nodes: Secondary | ICD-10-CM | POA: Diagnosis not present

## 2023-05-29 DIAGNOSIS — G43909 Migraine, unspecified, not intractable, without status migrainosus: Secondary | ICD-10-CM | POA: Diagnosis present

## 2023-05-29 DIAGNOSIS — E11649 Type 2 diabetes mellitus with hypoglycemia without coma: Secondary | ICD-10-CM | POA: Diagnosis present

## 2023-05-29 DIAGNOSIS — Z90710 Acquired absence of both cervix and uterus: Secondary | ICD-10-CM

## 2023-05-29 DIAGNOSIS — Z7984 Long term (current) use of oral hypoglycemic drugs: Secondary | ICD-10-CM

## 2023-05-29 DIAGNOSIS — Z66 Do not resuscitate: Secondary | ICD-10-CM | POA: Diagnosis present

## 2023-05-29 DIAGNOSIS — R339 Retention of urine, unspecified: Secondary | ICD-10-CM | POA: Diagnosis not present

## 2023-05-29 DIAGNOSIS — C651 Malignant neoplasm of right renal pelvis: Secondary | ICD-10-CM

## 2023-05-29 DIAGNOSIS — C7800 Secondary malignant neoplasm of unspecified lung: Secondary | ICD-10-CM | POA: Diagnosis present

## 2023-05-29 DIAGNOSIS — C7951 Secondary malignant neoplasm of bone: Secondary | ICD-10-CM | POA: Diagnosis not present

## 2023-05-29 DIAGNOSIS — C649 Malignant neoplasm of unspecified kidney, except renal pelvis: Secondary | ICD-10-CM | POA: Diagnosis not present

## 2023-05-29 DIAGNOSIS — A419 Sepsis, unspecified organism: Secondary | ICD-10-CM | POA: Diagnosis not present

## 2023-05-29 DIAGNOSIS — E8721 Acute metabolic acidosis: Secondary | ICD-10-CM | POA: Diagnosis not present

## 2023-05-29 DIAGNOSIS — K7460 Unspecified cirrhosis of liver: Secondary | ICD-10-CM | POA: Diagnosis present

## 2023-05-29 DIAGNOSIS — C342 Malignant neoplasm of middle lobe, bronchus or lung: Secondary | ICD-10-CM

## 2023-05-29 DIAGNOSIS — K5780 Diverticulitis of intestine, part unspecified, with perforation and abscess without bleeding: Secondary | ICD-10-CM | POA: Diagnosis not present

## 2023-05-29 DIAGNOSIS — R630 Anorexia: Secondary | ICD-10-CM | POA: Diagnosis present

## 2023-05-29 DIAGNOSIS — A0471 Enterocolitis due to Clostridium difficile, recurrent: Secondary | ICD-10-CM | POA: Diagnosis present

## 2023-05-29 DIAGNOSIS — D61810 Antineoplastic chemotherapy induced pancytopenia: Secondary | ICD-10-CM | POA: Diagnosis not present

## 2023-05-29 DIAGNOSIS — E66811 Obesity, class 1: Secondary | ICD-10-CM | POA: Diagnosis not present

## 2023-05-29 DIAGNOSIS — D701 Agranulocytosis secondary to cancer chemotherapy: Secondary | ICD-10-CM

## 2023-05-29 DIAGNOSIS — I672 Cerebral atherosclerosis: Secondary | ICD-10-CM | POA: Diagnosis not present

## 2023-05-29 DIAGNOSIS — Z905 Acquired absence of kidney: Secondary | ICD-10-CM

## 2023-05-29 DIAGNOSIS — E8809 Other disorders of plasma-protein metabolism, not elsewhere classified: Secondary | ICD-10-CM | POA: Diagnosis not present

## 2023-05-29 DIAGNOSIS — A0472 Enterocolitis due to Clostridium difficile, not specified as recurrent: Secondary | ICD-10-CM | POA: Diagnosis not present

## 2023-05-29 DIAGNOSIS — N1832 Chronic kidney disease, stage 3b: Secondary | ICD-10-CM | POA: Diagnosis not present

## 2023-05-29 DIAGNOSIS — E1122 Type 2 diabetes mellitus with diabetic chronic kidney disease: Secondary | ICD-10-CM | POA: Diagnosis present

## 2023-05-29 DIAGNOSIS — D849 Immunodeficiency, unspecified: Secondary | ICD-10-CM | POA: Diagnosis not present

## 2023-05-29 DIAGNOSIS — I700 Atherosclerosis of aorta: Secondary | ICD-10-CM | POA: Diagnosis not present

## 2023-05-29 DIAGNOSIS — M109 Gout, unspecified: Secondary | ICD-10-CM | POA: Diagnosis present

## 2023-05-29 DIAGNOSIS — R4701 Aphasia: Secondary | ICD-10-CM | POA: Diagnosis present

## 2023-05-29 DIAGNOSIS — Z789 Other specified health status: Secondary | ICD-10-CM

## 2023-05-29 DIAGNOSIS — K651 Peritoneal abscess: Secondary | ICD-10-CM | POA: Diagnosis not present

## 2023-05-29 DIAGNOSIS — Z8249 Family history of ischemic heart disease and other diseases of the circulatory system: Secondary | ICD-10-CM

## 2023-05-29 DIAGNOSIS — D709 Neutropenia, unspecified: Secondary | ICD-10-CM | POA: Diagnosis not present

## 2023-05-29 DIAGNOSIS — N179 Acute kidney failure, unspecified: Secondary | ICD-10-CM | POA: Diagnosis not present

## 2023-05-29 DIAGNOSIS — I129 Hypertensive chronic kidney disease with stage 1 through stage 4 chronic kidney disease, or unspecified chronic kidney disease: Secondary | ICD-10-CM | POA: Diagnosis not present

## 2023-05-29 DIAGNOSIS — R4182 Altered mental status, unspecified: Secondary | ICD-10-CM | POA: Diagnosis not present

## 2023-05-29 DIAGNOSIS — R0902 Hypoxemia: Secondary | ICD-10-CM | POA: Diagnosis not present

## 2023-05-29 DIAGNOSIS — Z7189 Other specified counseling: Secondary | ICD-10-CM

## 2023-05-29 DIAGNOSIS — J9811 Atelectasis: Secondary | ICD-10-CM | POA: Diagnosis not present

## 2023-05-29 DIAGNOSIS — I10 Essential (primary) hypertension: Secondary | ICD-10-CM | POA: Diagnosis not present

## 2023-05-29 DIAGNOSIS — K5720 Diverticulitis of large intestine with perforation and abscess without bleeding: Secondary | ICD-10-CM | POA: Diagnosis present

## 2023-05-29 DIAGNOSIS — D630 Anemia in neoplastic disease: Secondary | ICD-10-CM | POA: Diagnosis present

## 2023-05-29 DIAGNOSIS — D72829 Elevated white blood cell count, unspecified: Secondary | ICD-10-CM | POA: Diagnosis not present

## 2023-05-29 DIAGNOSIS — R635 Abnormal weight gain: Secondary | ICD-10-CM

## 2023-05-29 DIAGNOSIS — Z515 Encounter for palliative care: Secondary | ICD-10-CM | POA: Diagnosis not present

## 2023-05-29 DIAGNOSIS — R5081 Fever presenting with conditions classified elsewhere: Secondary | ICD-10-CM | POA: Diagnosis not present

## 2023-05-29 DIAGNOSIS — D509 Iron deficiency anemia, unspecified: Secondary | ICD-10-CM | POA: Diagnosis present

## 2023-05-29 DIAGNOSIS — Z800 Family history of malignant neoplasm of digestive organs: Secondary | ICD-10-CM

## 2023-05-29 DIAGNOSIS — R6520 Severe sepsis without septic shock: Secondary | ICD-10-CM | POA: Diagnosis present

## 2023-05-29 DIAGNOSIS — G4730 Sleep apnea, unspecified: Secondary | ICD-10-CM | POA: Diagnosis present

## 2023-05-29 DIAGNOSIS — R531 Weakness: Secondary | ICD-10-CM | POA: Diagnosis not present

## 2023-05-29 DIAGNOSIS — Z833 Family history of diabetes mellitus: Secondary | ICD-10-CM

## 2023-05-29 DIAGNOSIS — Z794 Long term (current) use of insulin: Secondary | ICD-10-CM

## 2023-05-29 DIAGNOSIS — I959 Hypotension, unspecified: Secondary | ICD-10-CM | POA: Diagnosis not present

## 2023-05-29 DIAGNOSIS — Z7409 Other reduced mobility: Secondary | ICD-10-CM | POA: Diagnosis present

## 2023-05-29 DIAGNOSIS — R7881 Bacteremia: Secondary | ICD-10-CM | POA: Diagnosis not present

## 2023-05-29 DIAGNOSIS — Z6833 Body mass index (BMI) 33.0-33.9, adult: Secondary | ICD-10-CM

## 2023-05-29 DIAGNOSIS — T451X5A Adverse effect of antineoplastic and immunosuppressive drugs, initial encounter: Secondary | ICD-10-CM

## 2023-05-29 DIAGNOSIS — D6959 Other secondary thrombocytopenia: Secondary | ICD-10-CM | POA: Diagnosis present

## 2023-05-29 DIAGNOSIS — C3490 Malignant neoplasm of unspecified part of unspecified bronchus or lung: Secondary | ICD-10-CM

## 2023-05-29 DIAGNOSIS — K219 Gastro-esophageal reflux disease without esophagitis: Secondary | ICD-10-CM | POA: Diagnosis present

## 2023-05-29 DIAGNOSIS — K5790 Diverticulosis of intestine, part unspecified, without perforation or abscess without bleeding: Secondary | ICD-10-CM | POA: Diagnosis not present

## 2023-05-29 DIAGNOSIS — Z79899 Other long term (current) drug therapy: Secondary | ICD-10-CM

## 2023-05-29 DIAGNOSIS — B964 Proteus (mirabilis) (morganii) as the cause of diseases classified elsewhere: Secondary | ICD-10-CM | POA: Diagnosis present

## 2023-05-29 DIAGNOSIS — I6782 Cerebral ischemia: Secondary | ICD-10-CM | POA: Diagnosis not present

## 2023-05-29 DIAGNOSIS — K630 Abscess of intestine: Secondary | ICD-10-CM

## 2023-05-29 DIAGNOSIS — Z860100 Personal history of colon polyps, unspecified: Secondary | ICD-10-CM

## 2023-05-29 DIAGNOSIS — R7989 Other specified abnormal findings of blood chemistry: Secondary | ICD-10-CM | POA: Diagnosis not present

## 2023-05-29 DIAGNOSIS — R188 Other ascites: Secondary | ICD-10-CM | POA: Diagnosis not present

## 2023-05-29 DIAGNOSIS — I864 Gastric varices: Secondary | ICD-10-CM | POA: Diagnosis present

## 2023-05-29 DIAGNOSIS — E876 Hypokalemia: Secondary | ICD-10-CM | POA: Diagnosis not present

## 2023-05-29 DIAGNOSIS — Z85528 Personal history of other malignant neoplasm of kidney: Secondary | ICD-10-CM

## 2023-05-29 DIAGNOSIS — R32 Unspecified urinary incontinence: Secondary | ICD-10-CM | POA: Diagnosis not present

## 2023-05-29 DIAGNOSIS — E871 Hypo-osmolality and hyponatremia: Secondary | ICD-10-CM | POA: Diagnosis present

## 2023-05-29 DIAGNOSIS — N739 Female pelvic inflammatory disease, unspecified: Secondary | ICD-10-CM | POA: Diagnosis present

## 2023-05-29 DIAGNOSIS — Z881 Allergy status to other antibiotic agents status: Secondary | ICD-10-CM

## 2023-05-29 DIAGNOSIS — R410 Disorientation, unspecified: Secondary | ICD-10-CM | POA: Diagnosis not present

## 2023-05-29 DIAGNOSIS — Z823 Family history of stroke: Secondary | ICD-10-CM

## 2023-05-29 DIAGNOSIS — E119 Type 2 diabetes mellitus without complications: Secondary | ICD-10-CM | POA: Diagnosis not present

## 2023-05-29 DIAGNOSIS — Z83438 Family history of other disorder of lipoprotein metabolism and other lipidemia: Secondary | ICD-10-CM

## 2023-05-29 DIAGNOSIS — Z87891 Personal history of nicotine dependence: Secondary | ICD-10-CM

## 2023-05-29 DIAGNOSIS — E869 Volume depletion, unspecified: Secondary | ICD-10-CM | POA: Diagnosis not present

## 2023-05-29 DIAGNOSIS — K766 Portal hypertension: Secondary | ICD-10-CM | POA: Diagnosis present

## 2023-05-29 DIAGNOSIS — R54 Age-related physical debility: Secondary | ICD-10-CM | POA: Diagnosis present

## 2023-05-29 DIAGNOSIS — I214 Non-ST elevation (NSTEMI) myocardial infarction: Secondary | ICD-10-CM | POA: Diagnosis not present

## 2023-05-29 DIAGNOSIS — K7469 Other cirrhosis of liver: Secondary | ICD-10-CM | POA: Diagnosis not present

## 2023-05-29 DIAGNOSIS — Z1611 Resistance to penicillins: Secondary | ICD-10-CM | POA: Diagnosis present

## 2023-05-29 DIAGNOSIS — Z7982 Long term (current) use of aspirin: Secondary | ICD-10-CM

## 2023-05-29 DIAGNOSIS — E785 Hyperlipidemia, unspecified: Secondary | ICD-10-CM | POA: Diagnosis present

## 2023-05-29 DIAGNOSIS — K5792 Diverticulitis of intestine, part unspecified, without perforation or abscess without bleeding: Secondary | ICD-10-CM | POA: Diagnosis not present

## 2023-05-29 DIAGNOSIS — Z825 Family history of asthma and other chronic lower respiratory diseases: Secondary | ICD-10-CM

## 2023-05-29 DIAGNOSIS — A4151 Sepsis due to Escherichia coli [E. coli]: Principal | ICD-10-CM | POA: Diagnosis present

## 2023-05-29 DIAGNOSIS — K7689 Other specified diseases of liver: Secondary | ICD-10-CM | POA: Diagnosis not present

## 2023-05-29 DIAGNOSIS — K5732 Diverticulitis of large intestine without perforation or abscess without bleeding: Secondary | ICD-10-CM | POA: Diagnosis not present

## 2023-05-29 DIAGNOSIS — D649 Anemia, unspecified: Secondary | ICD-10-CM

## 2023-05-29 LAB — BLOOD CULTURE ID PANEL (REFLEXED) - BCID2

## 2023-05-29 LAB — CBC WITH DIFFERENTIAL/PLATELET
Abs Immature Granulocytes: 0.01 10*3/uL (ref 0.00–0.07)
Basophils Absolute: 0 10*3/uL (ref 0.0–0.1)
Basophils Relative: 0 %
Eosinophils Absolute: 0 10*3/uL (ref 0.0–0.5)
Eosinophils Relative: 0 %
HCT: 30 % — ABNORMAL LOW (ref 36.0–46.0)
Hemoglobin: 10.3 g/dL — ABNORMAL LOW (ref 12.0–15.0)
Immature Granulocytes: 3 %
Lymphocytes Relative: 86 %
Lymphs Abs: 0.4 10*3/uL — ABNORMAL LOW (ref 0.7–4.0)
MCH: 33.4 pg (ref 26.0–34.0)
MCHC: 34.3 g/dL (ref 30.0–36.0)
MCV: 97.4 fL (ref 80.0–100.0)
Monocytes Absolute: 0 10*3/uL — ABNORMAL LOW (ref 0.1–1.0)
Monocytes Relative: 3 %
Neutro Abs: 0 10*3/uL — CL (ref 1.7–7.7)
Neutrophils Relative %: 8 %
Platelets: 48 10*3/uL — ABNORMAL LOW (ref 150–400)
RBC: 3.08 MIL/uL — ABNORMAL LOW (ref 3.87–5.11)
RDW: 17.5 % — ABNORMAL HIGH (ref 11.5–15.5)
WBC: 0.4 10*3/uL — CL (ref 4.0–10.5)
nRBC: 0 % (ref 0.0–0.2)

## 2023-05-29 LAB — LACTIC ACID, PLASMA: Lactic Acid, Venous: 1.7 mmol/L (ref 0.5–1.9)

## 2023-05-29 LAB — COMPREHENSIVE METABOLIC PANEL WITH GFR
ALT: 62 U/L — ABNORMAL HIGH (ref 0–44)
AST: 51 U/L — ABNORMAL HIGH (ref 15–41)
Albumin: 3.4 g/dL — ABNORMAL LOW (ref 3.5–5.0)
Alkaline Phosphatase: 107 U/L (ref 38–126)
Anion gap: 14 (ref 5–15)
BUN: 46 mg/dL — ABNORMAL HIGH (ref 8–23)
CO2: 19 mmol/L — ABNORMAL LOW (ref 22–32)
Calcium: 10.3 mg/dL (ref 8.9–10.3)
Chloride: 97 mmol/L — ABNORMAL LOW (ref 98–111)
Creatinine, Ser: 1.35 mg/dL — ABNORMAL HIGH (ref 0.44–1.00)
GFR, Estimated: 40 mL/min — ABNORMAL LOW (ref >60)
Glucose, Bld: 72 mg/dL (ref 70–99)
Potassium: 4.3 mmol/L (ref 3.5–5.1)
Sodium: 130 mmol/L — ABNORMAL LOW (ref 135–145)
Total Bilirubin: 1.5 mg/dL — ABNORMAL HIGH (ref 0.0–1.2)
Total Protein: 6 g/dL — ABNORMAL LOW (ref 6.5–8.1)

## 2023-05-29 LAB — URINALYSIS, W/ REFLEX TO CULTURE (INFECTION SUSPECTED)
Bilirubin Urine: NEGATIVE
Glucose, UA: NEGATIVE mg/dL
Hgb urine dipstick: NEGATIVE
Ketones, ur: NEGATIVE mg/dL
Leukocytes,Ua: NEGATIVE
Nitrite: NEGATIVE
Protein, ur: NEGATIVE mg/dL
Specific Gravity, Urine: 1.015 (ref 1.005–1.030)
pH: 5.5 (ref 5.0–8.0)

## 2023-05-29 LAB — CBG MONITORING, ED
Glucose-Capillary: 71 mg/dL (ref 70–99)
Glucose-Capillary: 100 mg/dL — ABNORMAL HIGH (ref 70–99)

## 2023-05-29 LAB — TROPONIN T, HIGH SENSITIVITY
Troponin T High Sensitivity: 170 ng/L (ref <19)
Troponin T High Sensitivity: 158 ng/L (ref <19)
Troponin T High Sensitivity: 129 ng/L (ref <19)

## 2023-05-29 LAB — GLUCOSE, CAPILLARY
Glucose-Capillary: 105 mg/dL — ABNORMAL HIGH (ref 70–99)
Glucose-Capillary: 131 mg/dL — ABNORMAL HIGH (ref 70–99)

## 2023-05-29 LAB — LIPASE, BLOOD: Lipase: 67 U/L — ABNORMAL HIGH (ref 11–51)

## 2023-05-29 MED ORDER — METRONIDAZOLE 500 MG/100ML IV SOLN
500.0000 mg | Freq: Two times a day (BID) | INTRAVENOUS | Status: DC
  Administered 2023-05-29 – 2023-05-31 (×4): 500 mg via INTRAVENOUS
  Filled 2023-05-29 (×4): qty 100

## 2023-05-29 MED ORDER — ACETAMINOPHEN 650 MG RE SUPP: 650.0000 mg | Freq: Four times a day (QID) | RECTAL | Status: DC | PRN

## 2023-05-29 MED ORDER — DICYCLOMINE HCL 10 MG PO CAPS
10.0000 mg | ORAL_CAPSULE | Freq: Once | ORAL | Status: AC | PRN
  Administered 2023-05-29: 10 mg via ORAL
  Filled 2023-05-29: qty 1

## 2023-05-29 MED ORDER — VANCOMYCIN HCL 1250 MG/250ML IV SOLN: 1250.0000 mg | INTRAVENOUS | Status: DC

## 2023-05-29 MED ORDER — SODIUM CHLORIDE 0.9 % IV SOLN
2.0000 g | Freq: Two times a day (BID) | INTRAVENOUS | Status: DC
  Administered 2023-05-29: 2 g via INTRAVENOUS
  Filled 2023-05-29: qty 12.5

## 2023-05-29 MED ORDER — ATORVASTATIN CALCIUM 20 MG PO TABS
20.0000 mg | ORAL_TABLET | Freq: Every day | ORAL | Status: DC
  Administered 2023-05-29: 20 mg via ORAL
  Filled 2023-05-29: qty 2

## 2023-05-29 MED ORDER — INSULIN GLARGINE-YFGN 100 UNIT/ML ~~LOC~~ SOLN
14.0000 [IU] | Freq: Every day | SUBCUTANEOUS | Status: DC
  Administered 2023-05-29 – 2023-06-14 (×17): 14 [IU] via SUBCUTANEOUS
  Filled 2023-05-29 (×17): qty 0.14

## 2023-05-29 MED ORDER — HEPARIN SODIUM (PORCINE) 5000 UNIT/ML IJ SOLN
5000.0000 [IU] | Freq: Three times a day (TID) | INTRAMUSCULAR | Status: DC
  Administered 2023-05-29 – 2023-05-31 (×5): 5000 [IU] via SUBCUTANEOUS
  Filled 2023-05-29 (×5): qty 1

## 2023-05-29 MED ORDER — CARVEDILOL 3.125 MG PO TABS
1.5625 mg | ORAL_TABLET | Freq: Two times a day (BID) | ORAL | Status: DC
  Administered 2023-05-29 – 2023-05-30 (×2): 1.5625 mg via ORAL
  Filled 2023-05-29 (×2): qty 1

## 2023-05-29 MED ORDER — INSULIN GLARGINE-YFGN 100 UNIT/ML ~~LOC~~ SOLN: 14.0000 [IU] | Freq: Every day | SUBCUTANEOUS | Status: DC

## 2023-05-29 MED ORDER — INSULIN ASPART 100 UNIT/ML IJ SOLN
0.0000 [IU] | Freq: Three times a day (TID) | INTRAMUSCULAR | Status: DC
  Administered 2023-05-30 – 2023-06-02 (×4): 2 [IU] via SUBCUTANEOUS
  Administered 2023-06-03: 3 [IU] via SUBCUTANEOUS
  Administered 2023-06-04: 2 [IU] via SUBCUTANEOUS
  Administered 2023-06-05 (×2): 3 [IU] via SUBCUTANEOUS
  Administered 2023-06-05: 5 [IU] via SUBCUTANEOUS
  Administered 2023-06-06 – 2023-06-08 (×2): 2 [IU] via SUBCUTANEOUS
  Administered 2023-06-08 (×2): 3 [IU] via SUBCUTANEOUS
  Administered 2023-06-09: 2 [IU] via SUBCUTANEOUS
  Administered 2023-06-09: 3 [IU] via SUBCUTANEOUS
  Administered 2023-06-09 – 2023-06-16 (×6): 2 [IU] via SUBCUTANEOUS

## 2023-05-29 MED ORDER — SODIUM CHLORIDE 0.9 % IV BOLUS
1000.0000 mL | Freq: Once | INTRAVENOUS | Status: AC
  Administered 2023-05-29: 1000 mL via INTRAVENOUS

## 2023-05-29 MED ORDER — ACETAMINOPHEN 325 MG PO TABS: 650.0000 mg | ORAL_TABLET | Freq: Four times a day (QID) | ORAL | Status: DC | PRN

## 2023-05-29 MED ORDER — IOHEXOL 350 MG/ML SOLN
100.0000 mL | Freq: Once | INTRAVENOUS | Status: AC | PRN
  Administered 2023-05-29: 60 mL via INTRAVENOUS

## 2023-05-29 MED ORDER — VANCOMYCIN HCL 2000 MG/400ML IV SOLN
2000.0000 mg | Freq: Once | INTRAVENOUS | Status: AC
  Administered 2023-05-29: 2000 mg via INTRAVENOUS
  Filled 2023-05-29: qty 400

## 2023-05-29 MED ORDER — CEFEPIME HCL 2 G IV SOLR
2.0000 g | Freq: Once | INTRAVENOUS | Status: AC
  Administered 2023-05-29: 2 g via INTRAVENOUS
  Filled 2023-05-29: qty 12.5

## 2023-05-29 MED ORDER — GLIMEPIRIDE 2 MG PO TABS: 2.0000 mg | ORAL_TABLET | Freq: Every day | ORAL | Status: DC

## 2023-05-29 MED ORDER — INSULIN ASPART 100 UNIT/ML IJ SOLN
0.0000 [IU] | Freq: Every day | INTRAMUSCULAR | Status: DC
  Administered 2023-06-03: 3 [IU] via SUBCUTANEOUS
  Administered 2023-06-04: 4 [IU] via SUBCUTANEOUS

## 2023-05-29 MED ORDER — SODIUM CHLORIDE 0.9 % IV SOLN
2.0000 g | INTRAVENOUS | Status: DC
  Administered 2023-05-29 – 2023-06-11 (×14): 2 g via INTRAVENOUS
  Filled 2023-05-29 (×14): qty 20

## 2023-05-29 MED ORDER — ONDANSETRON HCL 4 MG PO TABS
4.0000 mg | ORAL_TABLET | Freq: Four times a day (QID) | ORAL | Status: DC | PRN
  Administered 2023-05-29: 4 mg via ORAL
  Filled 2023-05-29: qty 1

## 2023-05-29 MED ORDER — ALBUTEROL SULFATE (2.5 MG/3ML) 0.083% IN NEBU
2.5000 mg | INHALATION_SOLUTION | RESPIRATORY_TRACT | Status: DC | PRN
  Administered 2023-06-15: 2.5 mg via RESPIRATORY_TRACT
  Filled 2023-05-29: qty 3

## 2023-05-29 MED ORDER — ONDANSETRON HCL 4 MG/2ML IJ SOLN
4.0000 mg | Freq: Four times a day (QID) | INTRAMUSCULAR | Status: DC | PRN
  Administered 2023-05-31 – 2023-06-08 (×3): 4 mg via INTRAVENOUS
  Filled 2023-05-29 (×3): qty 2

## 2023-05-29 MED ORDER — FERROUS SULFATE 325 (65 FE) MG PO TABS: 325.0000 mg | ORAL_TABLET | Freq: Every day | ORAL | Status: DC

## 2023-05-29 MED ORDER — ONDANSETRON HCL 4 MG/2ML IJ SOLN
4.0000 mg | Freq: Once | INTRAMUSCULAR | Status: AC
  Administered 2023-05-29: 4 mg via INTRAVENOUS
  Filled 2023-05-29: qty 2

## 2023-05-29 MED ORDER — ASPIRIN 81 MG PO TBEC
81.0000 mg | DELAYED_RELEASE_TABLET | Freq: Every day | ORAL | Status: DC
  Administered 2023-05-29: 81 mg via ORAL
  Filled 2023-05-29: qty 1

## 2023-05-29 MED ORDER — SODIUM CHLORIDE 0.9 % IV BOLUS
500.0000 mL | Freq: Once | INTRAVENOUS | Status: AC
  Administered 2023-05-29: 500 mL via INTRAVENOUS

## 2023-05-29 NOTE — Hospital Course (Addendum)
 Amanda Crawford is a 78 y.o. female with a history of small cell lung cancer on chemotherapy, renal cell cancer with lung metastasis status post wedge resection, CKD stage III, diabetes mellitus, C. difficile, diverticulitis.  Patient presented secondary to weakness, confusion, neutropenia with evidence of sepsis secondary to sigmoid diverticulitis with abscess,  recurrent C. difficile colitis and E. coli bacteremia.  General surgery consulted for diverticulitis with abscess and ID consulted for E. coli bacteremia and C. difficile colitis management. Repeat CT shows worsening abscess with concern for possible free air.

## 2023-05-29 NOTE — Progress Notes (Signed)
 PHARMACY - PHYSICIAN COMMUNICATION CRITICAL VALUE ALERT - BLOOD CULTURE IDENTIFICATION (BCID)  Amanda Crawford is an 78 y.o. female who presented to [facility] on 05/29/2023 with a chief complaint of weakness, anorexia, and mild confusion   Assessment: 2/4 GNR, Ecoli, no R  Name of physician (or Provider) Contacted: Laurence Pons  Current antibiotics: cefepime   Changes to prescribed antibiotics recommended:  D/c cefepime  and start CTX 2gm IV q24h  Results for orders placed or performed during the hospital encounter of 05/29/23  Blood Culture ID Panel (Reflexed) (Collected: 05/29/2023  4:17 AM)  Result Value Ref Range   Enterococcus faecalis NOT DETECTED NOT DETECTED   Enterococcus Faecium NOT DETECTED NOT DETECTED   Listeria monocytogenes NOT DETECTED NOT DETECTED   Staphylococcus species NOT DETECTED NOT DETECTED   Staphylococcus aureus (BCID) NOT DETECTED NOT DETECTED   Staphylococcus epidermidis NOT DETECTED NOT DETECTED   Staphylococcus lugdunensis NOT DETECTED NOT DETECTED   Streptococcus species NOT DETECTED NOT DETECTED   Streptococcus agalactiae NOT DETECTED NOT DETECTED   Streptococcus pneumoniae NOT DETECTED NOT DETECTED   Streptococcus pyogenes NOT DETECTED NOT DETECTED   A.calcoaceticus-baumannii NOT DETECTED NOT DETECTED   Bacteroides fragilis NOT DETECTED NOT DETECTED   Enterobacterales DETECTED (A) NOT DETECTED   Enterobacter cloacae complex NOT DETECTED NOT DETECTED   Escherichia coli DETECTED (A) NOT DETECTED   Klebsiella aerogenes NOT DETECTED NOT DETECTED   Klebsiella oxytoca NOT DETECTED NOT DETECTED   Klebsiella pneumoniae NOT DETECTED NOT DETECTED   Proteus species NOT DETECTED NOT DETECTED   Salmonella species NOT DETECTED NOT DETECTED   Serratia marcescens NOT DETECTED NOT DETECTED   Haemophilus influenzae NOT DETECTED NOT DETECTED   Neisseria meningitidis NOT DETECTED NOT DETECTED   Pseudomonas aeruginosa NOT DETECTED NOT DETECTED   Stenotrophomonas  maltophilia NOT DETECTED NOT DETECTED   Candida albicans NOT DETECTED NOT DETECTED   Candida auris NOT DETECTED NOT DETECTED   Candida glabrata NOT DETECTED NOT DETECTED   Candida krusei NOT DETECTED NOT DETECTED   Candida parapsilosis NOT DETECTED NOT DETECTED   Candida tropicalis NOT DETECTED NOT DETECTED   Cryptococcus neoformans/gattii NOT DETECTED NOT DETECTED   CTX-M ESBL NOT DETECTED NOT DETECTED   Carbapenem resistance IMP NOT DETECTED NOT DETECTED   Carbapenem resistance KPC NOT DETECTED NOT DETECTED   Carbapenem resistance NDM NOT DETECTED NOT DETECTED   Carbapenem resist OXA 48 LIKE NOT DETECTED NOT DETECTED   Carbapenem resistance VIM NOT DETECTED NOT DETECTED   Beau Bound RPh 05/29/2023, 10:15 PM

## 2023-05-29 NOTE — ED Notes (Signed)
 Called [facility] for consult to Hospitalist @8 :33am.  Spoke to [employer]

## 2023-05-29 NOTE — ED Notes (Signed)
 Called [facility] for transport @12 :27.  Spoke with Andy Bannister

## 2023-05-29 NOTE — H&P (Signed)
 History and Physical  Amanda Crawford DOB: [birth year] DOA: 05/29/2023  PCP: Neda Balk, MD   Chief Complaint: Weakness, mild confusion  HPI: Amanda Crawford is a 78 y.o. female with medical history significant for small cell lung cancer on chemotherapy, prior renal cell cancer with lung mets status post wedge resection, CKD stage III, insulin -dependent diabetes, recent diagnosis of C. difficile after hospitalization in March 2025 for diverticulitis now being admitted to the hospital with weakness, anorexia, and mild confusion in the setting of neutropenia.  History is provided by the patient as well as her husband who is at the bedside.  I was also able to review her most recent hospital and clinic notes.  Patient states she is still taking p.o. vancomycin  which was recently prescribed for C. difficile colitis.  States that she continues to have anorexia, lack of appetite, denies any fevers, still has watery diarrhea multiple times a day, with lower abdominal pain.  She denies any fevers, cough, shortness of breath, or chest pain.    Review of Systems: Please see HPI for pertinent positives and negatives. A complete 10 system review of systems are otherwise negative.  Past Medical History:  Diagnosis Date   Allergic state 03/12/2015   Arthritis    Cirrhosis (HCC)    Colon polyp 11/19/2014   Dermatitis 03/12/2015   Diarrhea 06/29/2016   Diverticulitis    DM (diabetes mellitus), type 2 (HCC) 06/29/2016   GERD (gastroesophageal reflux disease)    occ   Gout 03/12/2015   H/O measles    H/O mumps    Headache(784.0)    migraines   History of chicken pox 11/23/2014   Hyperglycemia 06/29/2016   Insomnia 02/13/2023   Lung cancer (HCC)    Migraine 11/23/2014   Obesity 11/23/2014   Pneumonia    child   Preventative health care 09/12/2015   Primary hypertension 12/15/2020   Renal cell cancer (HCC)    renal cell ca dx 9/01 and 8/08;   Renal insufficiency    Shortness  of breath    occ   Skin lesion of breast 03/12/2015   Sleep apnea 01/24/2023   recently dx 01/24/23 - CPAP ordered by not received yet   Small cell carcinoma of middle lobe of right lung (HCC) 03/14/2023   Small cell lung cancer, right middle lobe (HCC) 03/14/2023   Past Surgical History:  Procedure Laterality Date   ABDOMINAL HYSTERECTOMY     ANAL RECTAL MANOMETRY N/A 06/29/2021   Procedure: ANO RECTAL MANOMETRY;  Surgeon: Annis Kinder, DO;  Location: [facility] ENDOSCOPY;  Service: Gastroenterology;  Laterality: N/A;   APPENDECTOMY     BRONCHIAL BIOPSY  02/20/2023   Procedure: BRONCHIAL BIOPSIES;  Surgeon: Denson Flake, MD;  Location: [facility] ENDOSCOPY;  Service: Pulmonary;;   BRONCHIAL BRUSHINGS  02/20/2023   Procedure: BRONCHIAL BRUSHINGS;  Surgeon: Denson Flake, MD;  Location: [facility] ENDOSCOPY;  Service: Pulmonary;;   BRONCHIAL NEEDLE ASPIRATION BIOPSY  02/20/2023   Procedure: BRONCHIAL NEEDLE ASPIRATION BIOPSIES;  Surgeon: Denson Flake, MD;  Location: [facility] ENDOSCOPY;  Service: Pulmonary;;   CARDIAC CATHETERIZATION     yrs ago neg   CHOLECYSTECTOMY     COLONOSCOPY  2018   ESOPHAGOGASTRODUODENOSCOPY     years ago   FINE NEEDLE ASPIRATION  02/20/2023   Procedure: FINE NEEDLE ASPIRATION (FNA) LINEAR;  Surgeon: Denson Flake, MD;  Location: [facility] ENDOSCOPY;  Service: Pulmonary;;   IR IMAGING GUIDED PORT INSERTION  03/21/2023   IR  RADIOLOGIST EVAL & MGMT  07/17/2018   IR RADIOLOGIST EVAL & MGMT  09/12/2018   IR RADIOLOGIST EVAL & MGMT  06/18/2019   IR RADIOLOGIST EVAL & MGMT  01/13/2020   IR RADIOLOGIST EVAL & MGMT  07/08/2020   IR RADIOLOGIST EVAL & MGMT  10/12/2020   KIDNEY SURGERY  2001   Removed of left kidney    LUNG CANCER SURGERY Left 2008   RADIOLOGY WITH ANESTHESIA N/A 08/21/2018   Procedure: CT WITH ANESTHESIA RENAL CRYOABLATION;  Surgeon: Marland Silvas, MD;  Location: [facility] ORS;  Service: Radiology;  Laterality: N/A;   RENAL MASS EXCISION Left 2001   RIGHT/LEFT HEART CATH AND  CORONARY ANGIOGRAPHY N/A 07/27/2016   Procedure: Right/Left Heart Cath and Coronary Angiography;  Surgeon: Arleen Lacer, MD;  Location: [facility] INVASIVE CV LAB;  Service: Cardiovascular;  Laterality: N/A;   THORACOTOMY Left 05/09/2012   Procedure: THORACOTOMY MAJOR;  Surgeon: Bartley Lightning, MD;  Location: [facility] OR;  Service: Thoracic;  Laterality: Left;   VIDEO BRONCHOSCOPY N/A 05/09/2012   Procedure: VIDEO BRONCHOSCOPY;  Surgeon: Bartley Lightning, MD;  Location: [facility] OR;  Service: Thoracic;  Laterality: N/A;   VIDEO BRONCHOSCOPY WITH ENDOBRONCHIAL ULTRASOUND  02/20/2023   Procedure: VIDEO BRONCHOSCOPY WITH ENDOBRONCHIAL ULTRASOUND;  Surgeon: Denson Flake, MD;  Location: [facility] ENDOSCOPY;  Service: Pulmonary;;   WEDGE RESECTION Left 05/09/2012   Procedure: LEFT UPPER LOBE WEDGE RESECTION;  Surgeon: Bartley Lightning, MD;  Location: [facility] OR;  Service: Thoracic;  Laterality: Left;   Social History:  reports that she quit smoking about 17 years ago. Her smoking use included cigarettes. She started smoking about 52 years ago. She has a 17.5 pack-year smoking history. She has never used smokeless tobacco. She reports current alcohol use. She reports that she does not use drugs.  Allergies  Allergen Reactions   Bactrim  Ds [Sulfamethoxazole -Trimethoprim ] Rash    Family History  Problem Relation Age of Onset   Dementia Mother    Heart failure Father    Diabetes Father    Hyperlipidemia Father    Heart disease Father    Hypertension Father    Asthma Brother    Asthma Daughter    Alcohol abuse Maternal Aunt    Colon cancer Paternal Aunt    Cancer Paternal Grandmother    Hearing loss Paternal Grandfather    Stroke Paternal Grandfather      Prior to Admission medications   Medication Sig Start Date End Date Taking? Authorizing Provider  allopurinol  (ZYLOPRIM ) 300 MG tablet TAKE 1 TABLET BY MOUTH EVERY DAY 03/15/23   Neda Balk, MD  aspirin  EC 81 MG tablet Take 81 mg by mouth daily.    [provider]  atorvastatin  (LIPITOR) 20 MG tablet TAKE 1 TABLET BY MOUTH EVERY DAY 05/21/23   Neda Balk, MD  carvedilol  (COREG ) 3.125 MG tablet Take 0.5 tablets (1.5625 mg total) by mouth 2 (two) times daily with a meal. 02/20/23   Byrum, Delora Ferry, MD  dexamethasone  (DECADRON ) 4 MG tablet Take 2 tablets daily for 2 days, on days 4 and 5.Take with food. Every 21 days. 03/14/23   Ivor Mars, MD  diphenhydramine -acetaminophen  (TYLENOL  PM) 25-500 MG TABS Take 1 tablet by mouth at bedtime as needed (sleep).    [provider]  ferrous sulfate 325 (65 FE) MG tablet Take 325 mg by mouth at bedtime.    [provider]  fluconazole  (DIFLUCAN ) 100 MG tablet Take 1 tablet (100 mg total)  by mouth daily. 05/08/23   Ivor Mars, MD  glimepiride  (AMARYL ) 2 MG tablet TAKE 1 TABLET BY MOUTH EVERY DAY WITH BREAKFAST 05/21/23   Neda Balk, MD  glucose blood (ONETOUCH VERIO) test strip CHECK BLOOD SUGAR DAILY AS NEEDED 05/22/23   Neda Balk, MD  hyoscyamine  (LEVSIN SL) 0.125 MG SL tablet Place 1 tablet (0.125 mg total) under the tongue every 6 (six) hours as needed. 04/16/23   Neda Balk, MD  insulin  glargine (LANTUS ) 100 unit/mL SOPN Inject 14 Units into the skin at bedtime. 04/26/23: Reports during TOC 30-day program call: she is currently taking this insulin  14 U at bedtime: reports has not started Semglee insulin  because she still had Lantus  at home to use- per PCP advice    Neda Balk, MD  Insulin  Pen Needle (BD PEN NEEDLE NANO 2ND GEN) 32G X 4 MM MISC USE AS DIRECTED WITH LANTUS  ONCE A DAY. 11/20/22   Neda Balk, MD  Lancets [facility] DELICA PLUS LANCET33G) MISC USE AS DIRECTED TO TEST DAILY 09/11/22   Neda Balk, MD  meclizine  (ANTIVERT ) 12.5 MG tablet Take 1 tablet (12.5 mg total) by mouth 3 (three) times daily as needed for dizziness. As needed for   Dizziness or nausea 11/23/14   Neda Balk, MD  nystatin  cream (MYCOSTATIN ) Apply 1 application  topically 2 (two) times daily. Patient not taking: Reported on 05/28/2023 10/18/20   Neda Balk, MD  ondansetron  (ZOFRAN ) 8 MG tablet Take 1 tablet (8 mg total) by mouth every 8 (eight) hours as needed for nausea or vomiting. Start on third day after chemotherapy. 03/14/23   Ivor Mars, MD  pantoprazole  (PROTONIX ) 40 MG tablet Take 1 tablet (40 mg total) by mouth daily. 04/13/23 05/13/23  Pokhrel, Laxman, MD  prochlorperazine  (COMPAZINE ) 10 MG tablet Take 1 tablet (10 mg total) by mouth every 6 (six) hours as needed for nausea or vomiting (Nausea or vomiting). 03/14/23   Ennever, Sherryll Donald, MD  SEMGLEE, YFGN, 100 UNIT/ML Pen Inject 14 Units into the skin at bedtime. Patient not taking: Reported on 05/28/2023 12/27/22   [provider]  vancomycin  (VANCOCIN ) 125 MG capsule Take 125 mg by mouth 4 (four) times daily. Patient not taking: Reported on 05/28/2023 04/18/23   [provider]  Vitamin D , Ergocalciferol , (DRISDOL ) 1.25 MG (50000 UNIT) CAPS capsule TAKE 1 CAPSULE (50,000 UNITS TOTAL) BY MOUTH EVERY 7 (SEVEN) DAYS. 04/05/23   Webb, Padonda B, FNP  zolpidem  (AMBIEN ) 5 MG tablet Take 1 tablet (5 mg total) by mouth at bedtime. 04/24/23   Margaretann Sharper, MD  glyBURIDE -metformin  (GLUCOVANCE ) 1.25-250 MG tablet Take 1 tablet by mouth 2 (two) times daily with a meal. 03/25/19 03/25/19  Neda Balk, MD    Physical Exam: BP (!) 113/54 (BP Location: Right Arm, Patient Position: Sitting)   Pulse 90   Temp 98.1 F (36.7 C) (Oral)   Resp (!) 24   Ht 5\' 4"  (1.626 m)   Wt 89.6 kg   SpO2 92%   BMI 33.90 kg/m  General:  Alert, oriented, calm, in no acute distress, appears chronically ill, appears her stated age.  Her husband is at the bedside.  She looks tired, but nontoxic. Cardiovascular: RRR, no murmurs or rubs, no peripheral edema  Respiratory: clear to auscultation bilaterally, no wheezes, no crackles  Abdomen: soft, diffusely tender especially in the bilateral lower  quadrants, nondistended, normal bowel tones heard  Skin: dry, no rashes  Musculoskeletal: no joint effusions, normal range of motion  Psychiatric: appropriate affect, normal speech  Neurologic: extraocular muscles intact, clear speech, moving all extremities with intact sensorium         Labs on Admission:  Basic Metabolic Panel: Recent Labs  Lab 05/29/23 0303  NA 130*  K 4.3  CL 97*  CO2 19*  GLUCOSE 72  BUN 46*  CREATININE 1.35*  CALCIUM  10.3   Liver Function Tests: Recent Labs  Lab 05/29/23 0303  AST 51*  ALT 62*  ALKPHOS 107  BILITOT 1.5*  PROT 6.0*  ALBUMIN 3.4*   Recent Labs  Lab 05/29/23 0303  LIPASE 67*   No results for input(s): "AMMONIA" in the last 168 hours. CBC: Recent Labs  Lab 05/29/23 0303  WBC 0.4*  NEUTROABS 0.0*  HGB 10.3*  HCT 30.0*  MCV 97.4  PLT 48*   Cardiac Enzymes: No results for input(s): "CKTOTAL", "CKMB", "CKMBINDEX", "TROPONINI" in the last 168 hours. BNP (last 3 results) No results for input(s): "BNP" in the last 8760 hours.  ProBNP (last 3 results) No results for input(s): "PROBNP" in the last 8760 hours.  CBG: Recent Labs  Lab 05/29/23 0258 05/29/23 0817  GLUCAP 71 100*    Radiological Exams on Admission: CT Angio Chest PE W and/or Wo Contrast Result Date: 05/29/2023 CLINICAL DATA:  Weakness and decreased appetite. Disorientation. Small-cell lung cancer. EXAM: CT ANGIOGRAPHY CHEST WITH CONTRAST TECHNIQUE: Multidetector CT imaging of the chest was performed using the standard protocol during bolus administration of intravenous contrast. Multiplanar CT image reconstructions and MIPs were obtained to evaluate the vascular anatomy. RADIATION DOSE REDUCTION: This exam was performed according to the departmental dose-optimization program which includes automated exposure control, adjustment of the mA and/or kV according to patient size and/or use of iterative reconstruction technique. CONTRAST:  60mL OMNIPAQUE  IOHEXOL  350  MG/ML SOLN COMPARISON:  Chest CT 04/08/2023 FINDINGS: Cardiovascular: The heart size is normal. No substantial pericardial effusion. Moderate atherosclerotic calcification is noted in the wall of the thoracic aorta. There is no filling defect within the opacified pulmonary arteries to suggest the presence of an acute pulmonary embolus. Mediastinum/Nodes: No mediastinal lymphadenopathy. There is no hilar lymphadenopathy. The esophagus has normal imaging features. There is no axillary lymphadenopathy. Lungs/Pleura: Retro hilar right lower lobe pulmonary lesion measured previously at 2.7 x 2.3 cm is 2.6 x 1.5 cm on image 63/303. Postsurgical changes noted in the region of the left hilum. No pleural effusion. Upper Abdomen: Nodular liver contour suggests cirrhosis. Musculoskeletal: No worrisome lytic or sclerotic osseous abnormality. Review of the MIP images confirms the above findings. IMPRESSION: 1. No CT evidence for acute pulmonary embolus. 2. Retro hilar right lower lobe pulmonary lesion measured previously at 2.7 x 2.3 cm is 2.6 x 1.5 cm. 3. Nodular liver contour suggests cirrhosis. 4.  Aortic Atherosclerosis (ICD10-I70.0). Electronically Signed   By: Donnal Fusi M.D.   On: 05/29/2023 08:15   DG Chest 1 View Result Date: 05/29/2023 CLINICAL DATA:  Altered mental status EXAM: CHEST  1 VIEW COMPARISON:  04/08/2023 FINDINGS: Lungs are clear. No pneumothorax or pleural effusion. Right internal jugular chest port tip is seen at the superior cavoatrial junction. Cardiac size within limits. Pulmonary vascularity is normal. IMPRESSION: 1. No active disease. Electronically Signed   By: Worthy Heads M.D.   On: 05/29/2023 03:44   CT Head Wo Contrast Result Date: 05/29/2023 EXAM: CT HEAD WITHOUT 05/29/2023 03:29:37 AM TECHNIQUE: CT of the head was performed without the administration of intravenous  contrast. Automated exposure control, iterative reconstruction, and/or weight based adjustment of the mA/kV was  utilized to reduce the radiation dose to as low as reasonably achievable. COMPARISON: 04/08/2023 CLINICAL HISTORY: Mental status change, unknown cause. Aphasia. Patient currently on chemotherapy. History of diabetes, lung cancer, migraine, renal cell cancer, and renal insufficiency. FINDINGS: BRAIN AND VENTRICLES: There is no acute intracranial hemorrhage, mass effect or midline shift. No abnormal extra-axial fluid collection. The gray-white differentiation is maintained without evidence of an acute infarct. There is no evidence of hydrocephalus. Mild scattered periventricular and subcortical low density, likely reflecting small vessel ischemic changes. ORBITS: The visualized portion of the orbits demonstrate no acute abnormality. SINUSES: The visualized paranasal sinuses and mastoid air cells demonstrate no acute abnormality. SOFT TISSUES AND SKULL: No acute abnormality of the visualized skull or soft tissues. VASCULATURE: Intracranial atherosclerosis. IMPRESSION: 1. No acute intracranial abnormality. 2. Mild small vessel ischemic changes. Electronically signed by: Zadie Herter MD 05/29/2023 03:32 AM EDT RP Workstation: [id number]   Assessment/Plan Amanda Crawford is a 78 y.o. female with medical history significant for small cell lung cancer on chemotherapy, prior renal cell cancer with lung mets status post wedge resection, CKD stage III, insulin -dependent diabetes, recent diagnosis of C. difficile after hospitalization in March 2025 for diverticulitis now being admitted to the hospital with weakness, anorexia, and mild confusion in the setting of neutropenia.   SIRS-meeting criteria with neutropenia, tachycardia and had recent abdominal infection.  In the setting of active chemotherapy there is concern for acute infection.  However patient is afebrile and technically not meeting sepsis criteria. -Inpatient admission -Follow-up blood cultures -Empiric IV cefepime , Flagyl , vancomycin  -Given recent  diverticulitis, C. difficile colitis will check CT abdomen pelvis with contrast  C. difficile colitis-recently diagnosed after most recent hospitalization last month.  Apparently she has not completed course of antibiotics.  Will cover empirically with IV Flagyl  as noted above.  If CT scan without acute findings, would continue p.o. vancomycin  to complete course of treatment.  Small cell lung cancer on chemotherapy-under the care of Dr. Maria Shiner, who has been added to inpatient treatment team  IDDM-continue home Semglee, with sliding scale  Iron deficiency anemia-continue daily iron supplementation  Neutropenia-due to ongoing chemotherapy, no fever -Neutropenic precautions  CKD stage III-renal function appears to be at baseline  Liver cirrhosis-new finding on CT scan during her last hospital stay, will need outpatient GI follow-up for this  Hyperlipidemia-Lipitor  DVT prophylaxis: Subcutaneous heparin     Code Status: Full Code  Consults called: None  Admission status: The appropriate patient status for this patient is INPATIENT. Inpatient status is judged to be reasonable and necessary in order to provide the required intensity of service to ensure the patient's safety. The patient's presenting symptoms, physical exam findings, and initial radiographic and laboratory data in the context of their chronic comorbidities is felt to place them at high risk for further clinical deterioration. Furthermore, it is not anticipated that the patient will be medically stable for discharge from the hospital within 2 midnights of admission.    I certify that at the point of admission it is my clinical judgment that the patient will require inpatient hospital care spanning beyond 2 midnights from the point of admission due to high intensity of service, high risk for further deterioration and high frequency of surveillance required  Time spent: 48 minutes  Waunita Sandstrom Rickey Charm MD [facility] Pager  [phone]  If 7PM-7AM, please contact night-coverage [email or web address] Password [facility]  05/29/2023, 2:56 PM

## 2023-05-29 NOTE — Telephone Encounter (Signed)
 Copied from CRM [id number]. Topic: Clinical - Medication Question >> May 28, 2023  4:32 PM Jethro Morrison wrote: Reason for CRM: PT Amanda Crawford [phone] CALLING ABOUT PT BLOOD SUGAR. STATED IT HAS BEEN GOING WITHIN THE LAST 30 DAYS OR SO AND WOULD LIKE EITHER THE NP TAYLOR, DR [name], OR THE NURSE TO GIVE HER A CALL ABOUT HER BLOOD SUGARS.   Returned patients daughter call and she reported that her mother's BS was fluctuating still. Also want Dr. Rodrick Clapper to know that pt ended up in the [facility] and she was transported to [facility].

## 2023-05-29 NOTE — Progress Notes (Addendum)
 Amanda Crawford is a 78 year old female with history of small cell lung cancer, and clear-cell renal cell carcinoma under chemotherapy with Dr. Maria Shiner, Dm II, HLD, HTN presented to [facility] by her husband for evaluation of generalized weakness, decreased appetite, mild change in mental status x 2 days.   Patient denied of having any chest pain or shortness of breath. No reported fever chills nausea, vomiting constipation or diarrhea headaches visual changes, acute asymmetric weaknesses.  ED evaluation by EDP:  Blood pressure (!) 106/56, pulse 96, temperature 99.3 F (37.4 C), resp. rate (!) 29, height 5\' 4"  (1.626 m), weight 93.4 kg, SpO2 97%.  CBC WBC was 0.4, hemoglobin 10.3, sodium 130, chloride 97, CO2 19, BUN 46, creatinine 1.35, lactic acid 1.7, UA-unremarkable CTA chest negative for PE: Retro hilar right lower lobe pulmonary lesion measured previously at 2.7 x 2.3 cm is 2.6 x 1.5 cm. Nodular liver contour suggests cirrhosis. CT head--acute abnormalities, EKG-no acute changes,  2 sets of blood cultures were obtained-patient received 2 g of cefepime , of normal saline.  Was requested patient to be admitted for neutropenic fever-early sepsis criteria, with current SIRS criteria  Bed request placed for either Amanda Crawford or [name] bed    SIGNED: Bobbetta Burnet, MD, FHM. FAAFP [facility],  Pager (please use [email or web address] to page/text)  Please use Epic Secure Chat for non-urgent communication (7AM-7PM) If 7PM-7AM, please contact night-coverage [email or web address],  05/29/2023, 10:24 AM

## 2023-05-29 NOTE — ED Triage Notes (Addendum)
 Pt brought in by husband, states not acting right, unable to complete sentences, was unable to take her blood sugar by herself. Pt is cancer pt on chemo. States was normal before midnight.

## 2023-05-29 NOTE — ED Provider Notes (Signed)
 78 yo female w/ hx of diabetes, lung cancer, presenting with somnolence and confusion yesterday  Neutropenic here. Mildly elevated troponin 170 -> 158 CT head no emergent issue  IV cepefime for emperic treatment 1 liter NS bolus given in total (Na 130)  Pending CT PE study prior to admission  Physical Exam  BP (!) 111/57   Pulse (!) 103   Temp 99.3 F (37.4 C) (Oral)   Resp (!) 26   Ht 5\' 4"  (1.626 m)   Wt 93.4 kg   SpO2 96%   BMI 35.34 kg/m   Physical Exam  Procedures  Procedures  ED Course / MDM    Medical Decision Making Amount and/or Complexity of Data Reviewed Labs: ordered. Radiology: ordered.  Risk OTC drugs. Prescription drug management. Decision regarding hospitalization.   CT PE study with chronic findings, no acute PE or infiltrate/infection  Admitted to hospitalist 10 am Dr Suzan Erm, who requested 2nd liter IV saline, which is ordered  Home meds and evening long acting insulin  ordered here  Pt stable - BP low but near baseline with MAP > 65 mmhg.    Arvilla Birmingham, MD 05/29/23 1102

## 2023-05-29 NOTE — ED Provider Notes (Signed)
 [facility] Provider Note   CSN: [id number] Arrival date & time: 05/29/23  0247     History  Chief Complaint  Patient presents with   Aphasia    Amanda Crawford is a 78 y.o. female.  Patient is an 78 year old female with past medical history of type 2 diabetes, lung cancer currently undergoing chemotherapy.  Patient brought by her husband for evaluation of weakness and decreased appetite starting yesterday.  This evening, she seemed more disoriented and "not acting right".  Patient denies any specific aches or pains.  No fevers or chills.       Home Medications Prior to Admission medications   Medication Sig Start Date End Date Taking? Authorizing Provider  allopurinol  (ZYLOPRIM ) 300 MG tablet TAKE 1 TABLET BY MOUTH EVERY DAY 03/15/23   Neda Balk, MD  aspirin  EC 81 MG tablet Take 81 mg by mouth daily.    [provider]  atorvastatin  (LIPITOR) 20 MG tablet TAKE 1 TABLET BY MOUTH EVERY DAY 05/21/23   Neda Balk, MD  carvedilol  (COREG ) 3.125 MG tablet Take 0.5 tablets (1.5625 mg total) by mouth 2 (two) times daily with a meal. 02/20/23   Byrum, Delora Ferry, MD  dexamethasone  (DECADRON ) 4 MG tablet Take 2 tablets daily for 2 days, on days 4 and 5.Take with food. Every 21 days. 03/14/23   Ivor Mars, MD  diphenhydramine -acetaminophen  (TYLENOL  PM) 25-500 MG TABS Take 1 tablet by mouth at bedtime as needed (sleep).    [provider]  ferrous sulfate 325 (65 FE) MG tablet Take 325 mg by mouth at bedtime.    [provider]  fluconazole  (DIFLUCAN ) 100 MG tablet Take 1 tablet (100 mg total) by mouth daily. 05/08/23   Ivor Mars, MD  glimepiride  (AMARYL ) 2 MG tablet TAKE 1 TABLET BY MOUTH EVERY DAY WITH BREAKFAST 05/21/23   Neda Balk, MD  glucose blood (ONETOUCH VERIO) test strip CHECK BLOOD SUGAR DAILY AS NEEDED 05/22/23   Neda Balk, MD  hyoscyamine  (LEVSIN SL) 0.125 MG SL tablet Place 1  tablet (0.125 mg total) under the tongue every 6 (six) hours as needed. 04/16/23   Neda Balk, MD  insulin  glargine (LANTUS ) 100 unit/mL SOPN Inject 14 Units into the skin at bedtime. 04/26/23: Reports during TOC 30-day program call: she is currently taking this insulin  14 U at bedtime: reports has not started Semglee insulin  because she still had Lantus  at home to use- per PCP advice    Neda Balk, MD  Insulin  Pen Needle (BD PEN NEEDLE NANO 2ND GEN) 32G X 4 MM MISC USE AS DIRECTED WITH LANTUS  ONCE A DAY. 11/20/22   Neda Balk, MD  Lancets [facility] DELICA PLUS LANCET33G) MISC USE AS DIRECTED TO TEST DAILY 09/11/22   Neda Balk, MD  meclizine  (ANTIVERT ) 12.5 MG tablet Take 1 tablet (12.5 mg total) by mouth 3 (three) times daily as needed for dizziness. As needed for   Dizziness or nausea 11/23/14   Neda Balk, MD  nystatin  cream (MYCOSTATIN ) Apply 1 application topically 2 (two) times daily. Patient not taking: Reported on 05/28/2023 10/18/20   Neda Balk, MD  ondansetron  (ZOFRAN ) 8 MG tablet Take 1 tablet (8 mg total) by mouth every 8 (eight) hours as needed for nausea or vomiting. Start on third day after chemotherapy. 03/14/23   Ivor Mars, MD  pantoprazole  (PROTONIX ) 40 MG tablet Take 1 tablet (40 mg  total) by mouth daily. 04/13/23 05/13/23  Pokhrel, Laxman, MD  prochlorperazine  (COMPAZINE ) 10 MG tablet Take 1 tablet (10 mg total) by mouth every 6 (six) hours as needed for nausea or vomiting (Nausea or vomiting). 03/14/23   Ennever, Sherryll Donald, MD  SEMGLEE, YFGN, 100 UNIT/ML Pen Inject 14 Units into the skin at bedtime. Patient not taking: Reported on 05/28/2023 12/27/22   [provider]  vancomycin  (VANCOCIN ) 125 MG capsule Take 125 mg by mouth 4 (four) times daily. Patient not taking: Reported on 05/28/2023 04/18/23   [provider]  Vitamin D , Ergocalciferol , (DRISDOL ) 1.25 MG (50000 UNIT) CAPS capsule TAKE 1 CAPSULE (50,000 UNITS TOTAL) BY MOUTH EVERY 7  (SEVEN) DAYS. 04/05/23   Webb, Padonda B, FNP  zolpidem  (AMBIEN ) 5 MG tablet Take 1 tablet (5 mg total) by mouth at bedtime. 04/24/23   Margaretann Sharper, MD  glyBURIDE -metformin  (GLUCOVANCE ) 1.25-250 MG tablet Take 1 tablet by mouth 2 (two) times daily with a meal. 03/25/19 03/25/19  Neda Balk, MD      Allergies    Bactrim  ds [sulfamethoxazole -trimethoprim ]    Review of Systems   Review of Systems  All other systems reviewed and are negative.   Physical Exam Updated Vital Signs There were no vitals taken for this visit. Physical Exam Vitals and nursing note reviewed.  Constitutional:      General: She is not in acute distress.    Appearance: She is well-developed. She is not diaphoretic.     Comments: Patient is a pale appearing elderly female.  She is in no distress.  She is awake and alert, but does seem to have difficulty answering questions.  HENT:     Head: Normocephalic and atraumatic.  Cardiovascular:     Rate and Rhythm: Normal rate and regular rhythm.     Heart sounds: No murmur heard.    No friction rub. No gallop.  Pulmonary:     Effort: Pulmonary effort is normal. No respiratory distress.     Breath sounds: Normal breath sounds. No wheezing.  Abdominal:     General: Bowel sounds are normal. There is no distension.     Palpations: Abdomen is soft.     Tenderness: There is no abdominal tenderness.  Musculoskeletal:        General: Normal range of motion.     Cervical back: Normal range of motion and neck supple.  Skin:    General: Skin is warm and dry.  Neurological:     General: No focal deficit present.     Mental Status: She is alert and oriented to person, place, and time.     Cranial Nerves: No cranial nerve deficit.     Motor: No weakness.     Coordination: Coordination normal.     Comments: Patient is awake and alert and following commands appropriately.  She is slow to respond to questions, but does seem to answer appropriately.     ED Results /  Procedures / Treatments   Labs (all labs ordered are listed, but only abnormal results are displayed) Labs Reviewed  CBG MONITORING, ED    EKG ED ECG REPORT   Date: 05/29/2023  Rate: 112  Rhythm: normal sinus rhythm  QRS Axis: normal  Intervals: normal  ST/T Wave abnormalities: indeterminate  Conduction Disutrbances:left bundle branch block  Narrative Interpretation:   Old EKG Reviewed: unchanged  I have personally reviewed the EKG tracing and agree with the computerized printout as noted.   Radiology No results  found.  Procedures Procedures    Medications Ordered in ED Medications  sodium chloride  0.9 % bolus 500 mL (has no administration in time range)    ED Course/ Medical Decision Making/ A&P  Patient is an 78 year old female with history of type 2 diabetes and lung cancer undergoing chemotherapy.  Patient presenting with weakness, decreased appetite, and confusion starting yesterday afternoon.  Patient arrives here with stable vital signs, but is borderline febrile with temp of 99.3.  Patient has no specific complaints.  She does respond to questions appropriately and has no focal deficits on neurologic exam.  Laboratory studies obtained including CBC, CMP, lipase, and troponin.  Patient's white count is 0.4 and platelet count is 48.  Electrolytes reveal a BUN of 46 and creatinine of 1.35 with GFR of 40.  Troponin elevated at 170, but with no acute EKG changes.  Chest x-ray shows no active disease and CT scan of the head shows no acute intracranial abnormality.  Given the low-grade fever and neutropenia, blood cultures were also obtained and patient was given 2 g of IV cefepime .  She was also hydrated with normal saline.  My plan was to admit to the hospitalist service, however Dr. Achilles Holes has expressed concerns for pulmonary embolism given the low-grade fever and elevated troponin.  He is requesting the study be performed prior to admission.  Study has been ordered  and has not yet been performed.  Care will be signed out to oncoming provider at shift change to obtain the results of this study and determine the final disposition.  Final Clinical Impression(s) / ED Diagnoses Final diagnoses:  None    Rx / DC Orders ED Discharge Orders     None         Orvilla Blander, MD 05/29/23 [phone]

## 2023-05-29 NOTE — Progress Notes (Signed)
 Pharmacy Antibiotic Note  Amanda Crawford is a 78 y.o. female with lung cancer on chemotherapy PTA who presented to the ED on 05/29/2023 with AMS, generalized weakness and decreased appetite.  Pharmacy has been consulted vancomycin  and cefepime  for suspected sepsis.  Today, 05/29/2023: - scr 1.35 (crcl~36) - WBC 0.4, ANC = 0 - pt received cefepime  2gm x1 at 0453 today   Plan: - vancomycin  2000 mg IV x1, then 1250 mg q48h for est AUC 492 - cefepime  2gm q12h - monitor rena function closely  ______________________________________  Height: 5\' 4"  (162.6 cm) Weight: 89.6 kg (197 lb 8 oz) IBW/kg (Calculated) : 54.7  Temp (24hrs), Avg:98.8 F (37.1 C), Min:97.9 F (36.6 C), Max:99.3 F (37.4 C)  Recent Labs  Lab 05/29/23 0303 05/29/23 0316  WBC 0.4*  --   CREATININE 1.35*  --   LATICACIDVEN  --  1.7    Estimated Creatinine Clearance: 36 mL/min (A) (by C-G formula based on SCr of 1.35 mg/dL (H)).    Allergies  Allergen Reactions   Bactrim  Ds [Sulfamethoxazole -Trimethoprim ] Rash     Thank you for allowing pharmacy to be a part of this patient's care.  Spurgeon Dyer 05/29/2023 2:23 PM

## 2023-05-30 ENCOUNTER — Inpatient Hospital Stay (HOSPITAL_COMMUNITY)

## 2023-05-30 DIAGNOSIS — A4151 Sepsis due to Escherichia coli [E. coli]: Secondary | ICD-10-CM | POA: Diagnosis not present

## 2023-05-30 DIAGNOSIS — D709 Neutropenia, unspecified: Secondary | ICD-10-CM | POA: Diagnosis not present

## 2023-05-30 DIAGNOSIS — C342 Malignant neoplasm of middle lobe, bronchus or lung: Secondary | ICD-10-CM

## 2023-05-30 LAB — GLUCOSE, CAPILLARY
Glucose-Capillary: 110 mg/dL — ABNORMAL HIGH (ref 70–99)
Glucose-Capillary: 125 mg/dL — ABNORMAL HIGH (ref 70–99)
Glucose-Capillary: 86 mg/dL (ref 70–99)
Glucose-Capillary: 103 mg/dL — ABNORMAL HIGH (ref 70–99)

## 2023-05-30 LAB — C DIFFICILE QUICK SCREEN W PCR REFLEX
C Diff antigen: POSITIVE — AB
C Diff toxin: NEGATIVE

## 2023-05-30 LAB — CLOSTRIDIUM DIFFICILE BY PCR, REFLEXED: Toxigenic C. Difficile by PCR: POSITIVE — AB

## 2023-05-30 MED ORDER — IOHEXOL 9 MG/ML PO SOLN
1000.0000 mL | ORAL | Status: AC
  Administered 2023-05-30: 1000 mL via ORAL

## 2023-05-30 MED ORDER — SODIUM CHLORIDE (PF) 0.9 % IJ SOLN
INTRAMUSCULAR | Status: AC
  Filled 2023-05-30: qty 50

## 2023-05-30 MED ORDER — MELATONIN 3 MG PO TABS
3.0000 mg | ORAL_TABLET | Freq: Once | ORAL | Status: AC
  Administered 2023-05-30: 3 mg via ORAL
  Filled 2023-05-30: qty 1

## 2023-05-30 MED ORDER — DICYCLOMINE HCL 10 MG PO CAPS
10.0000 mg | ORAL_CAPSULE | Freq: Once | ORAL | Status: AC | PRN
  Administered 2023-05-30: 10 mg via ORAL
  Filled 2023-05-30: qty 1

## 2023-05-30 MED ORDER — FILGRASTIM-AAFI 480 MCG/0.8ML IJ SOSY
480.0000 ug | PREFILLED_SYRINGE | Freq: Every day | INTRAMUSCULAR | Status: DC
  Administered 2023-05-30 – 2023-06-03 (×5): 480 ug via SUBCUTANEOUS
  Filled 2023-05-30 (×6): qty 0.8

## 2023-05-30 MED ORDER — SODIUM CHLORIDE 0.9 % IV SOLN
INTRAVENOUS | Status: DC
Start: 2023-05-30 — End: 2023-06-02

## 2023-05-30 MED ORDER — FILGRASTIM 480 MCG/1.6ML IJ SOLN: 480.0000 ug | Freq: Every day | INTRAMUSCULAR | Status: DC

## 2023-05-30 MED ORDER — IOHEXOL 9 MG/ML PO SOLN
ORAL | Status: AC
  Filled 2023-05-30: qty 1000

## 2023-05-30 MED ORDER — MORPHINE SULFATE (PF) 2 MG/ML IV SOLN
1.0000 mg | INTRAVENOUS | Status: DC | PRN
  Administered 2023-05-30 – 2023-06-15 (×14): 1 mg via INTRAVENOUS
  Filled 2023-05-30 (×14): qty 1

## 2023-05-30 MED ORDER — IOHEXOL 300 MG/ML  SOLN
100.0000 mL | Freq: Once | INTRAMUSCULAR | Status: AC | PRN
  Administered 2023-05-30: 100 mL via INTRAVENOUS

## 2023-05-30 NOTE — Plan of Care (Signed)

## 2023-05-30 NOTE — Progress Notes (Signed)
 PROGRESS NOTE  AARYA SIMM  DOB: Dec 10, 1945  PCP: Neda Balk, MD UXL:[id number]  DOA: 05/29/2023  LOS: 1 day  Hospital Day: 2  Brief narrative: Amanda Crawford is a 78 y.o. female with PMH significant for small cell lung cancer, prior renal cell cancer with lung mets, DM2, CKD, recently hospitalized in March for septic shock diverticulitis and C. difficile infection, discharged on oral Augmentin  and oral vancomycin . With the completion of antibiotics, patient's symptoms improved but few days ago, started again 4/29, patient presented to the ED with complaint of persistent watery diarrhea weakness, poor oral intake, confusion.  Initial labs with WBC count significantly low at 0.4, hemoglobin 10.9, platelet 48, sodium 130, BUN/creatinine 46/1.35, troponin elevated to 170 Chest x-ray, CT head unremarkable for acute pathology CT angio chest did not show any evidence of pulm embolism, showed right lower lobe mass, liver cirrhosis. Started on broad-spectrum antibiotics for neutropenia  Admitted to [facility] Blood culture sent on admission is growing E. coli, pending sensitivity  Subjective: Patient was seen and examined this morning. Pleasant elderly Caucasian female.  Propped up in bed.  Not in distress.  On low-flow oxygen. Daughter at bedside. Patient has been having several loose bowel movements since yesterday.  Vomited 1 time this morning. Stool sample sent for C. difficile later this morning. Chart reviewed In the last 24 hours, afebrile, heart rate in 90s, blood pressure in 100s, breathing on 1 to 2 L oxygen  Assessment and plan: Sepsis POA E. coli bacteremia Recently admitted for diverticulitis and was treated with oral Augmentin . Blood culture this admission is growing E. coli sensitivity Currently on broad-spectrum antibiotic coverage with IV Rocephin , IV Flagyl , IV vancomycin  Repeat WBC count Neutropenic precautions Recent Labs  Lab 05/29/23 0303 05/29/23 0316  WBC  0.4*  --   LATICACIDVEN  --  1.7   Acute diarrhea  Recent C. difficile colitis Tested positive for C. difficile 3/18 on recent admission and was started on oral vancomycin  Presented with recurrence of diarrhea, poor oral intake Continues to have loose bowel movement.  Pending C. difficile assay and CT abdomen Ordered for regular consistency diet IV hydration with NS   Extensive stage small cell lung cancer on chemotherapy under the care of Dr. Maria Shiner Per oncology note, she had 3 cycles of chemotherapy, last on 05/21/2023.    Neutropenia Due to chemotherapy.  Neupogen added by oncology  Chronic macrocytic anemia Hemoglobin stable.  Continue to monitor Continue iron supplement Was also on aspirin .  I would hold it given chronic anemia Recent Labs    09/07/22 1110 10/12/22 1036 02/08/23 1110 03/14/23 1116 04/12/23 0333 04/16/23 1321 04/30/23 0910 05/21/23 1003 05/29/23 0303  HGB  --    < > 13.0   < > 8.9* 9.7* 9.9* 8.8* 10.3*  MCV  --    < > 101.7*   < > 100.0 102.3* 104.5* 105.7* 97.4  VITAMINB12 612  --  784  --   --   --   --   --   --    < > = values in this interval not displayed.    Thrombocytopenia Platelet low due to chemo.  No active bleeding Repeat labs Recent Labs  Lab 05/29/23 0303  PLT 48*    Type 2 diabetes mellitus A1c 7.6 on 02/08/2023 PTA meds-Semglee nightly.  No longer on glimepiride  because of hypoglycemia episodes recently Currently on SSI/Accu-Cheks Recent Labs  Lab 05/29/23 0817 05/29/23 1617 05/29/23 2204 05/30/23 0810 05/30/23 1138  GLUCAP 100* 105* 131* 110* 125*   CKD 3B Acute metabolic acidosis Renal function at baseline.  Continue to monitor Serum bicarb level was low at 19 on admission.  Repeat labs in a.m.  Recent Labs    03/20/23 1235 04/08/23 0305 04/09/23 0335 04/10/23 0233 04/11/23 0242 04/12/23 0333 04/16/23 1321 04/30/23 0910 05/21/23 1003 05/29/23 0303  BUN 46* 56* 37* 28* 23 20 15 22  28* 46*  CREATININE  1.54* 1.64* 1.40* 1.45* 1.27* 1.33* 1.24* 1.53* 1.59* 1.35*  CO2 25 18* 14* 18* 17* 18* 26 26 25  19*   Hypertension PTA meds- Coreg  half tab of 3.125 mg twice daily I would hold Coreg  given low blood pressure, poor oral intake.  Liver cirrhosis new finding on CT scan during her last hospital stay, will need outpatient GI follow-up for this   Hyperlipidemia Would hold statin due to liver cirrhosis.   Mobility: Impaired mobility due to chemo.  Would benefit from PT eval  Goals of care   Code Status: Full Code     DVT prophylaxis:  heparin  injection 5,000 Units Start: 05/29/23 2200   Antimicrobials: IV Rocephin , IV Flagyl , IV vancomycin  Fluid: NS at 75 mL/h Consultants: Oncology Family Communication: Daughter at bedside  Status: Inpatient Level of care:  Telemetry Medical   Patient is from: Home Needs to continue in-hospital care: Ongoing workup for diarrhea Anticipated d/c to: Pending clinical course, pending PT eval   Diet:  Diet Order             Diet Carb Modified Fluid consistency: Thin  Diet effective now                   Scheduled Meds:  filgrastim (NIVESTYM) SQ  480 mcg Subcutaneous Daily   heparin   5,000 Units Subcutaneous Q8H   insulin  aspart  0-15 Units Subcutaneous TID WC   insulin  aspart  0-5 Units Subcutaneous QHS   insulin  glargine-yfgn  14 Units Subcutaneous QHS    PRN meds: acetaminophen  **OR** acetaminophen , albuterol , morphine  injection, ondansetron  **OR** ondansetron  (ZOFRAN ) IV   Infusions:   sodium chloride  75 mL/hr at 05/30/23 1211   cefTRIAXone  (ROCEPHIN )  IV 2 g (05/29/23 2227)   metronidazole  500 mg (05/30/23 0342)   [START ON 05/31/2023] vancomycin       Antimicrobials: Anti-infectives (From admission, onward)    Start     Dose/Rate Route Frequency Ordered Stop   05/31/23 1600  vancomycin  (VANCOREADY) IVPB 1250 mg/250 mL        1,250 mg 166.7 mL/hr over 90 Minutes Intravenous Every 48 hours 05/29/23 1437     05/29/23  2300  cefTRIAXone  (ROCEPHIN ) 2 g in sodium chloride  0.9 % 100 mL IVPB        2 g 200 mL/hr over 30 Minutes Intravenous Every 24 hours 05/29/23 2211     05/29/23 1700  ceFEPIme  (MAXIPIME ) 2 g in sodium chloride  0.9 % 100 mL IVPB  Status:  Discontinued        2 g 200 mL/hr over 30 Minutes Intravenous Every 12 hours 05/29/23 1437 05/29/23 2211   05/29/23 1600  metroNIDAZOLE  (FLAGYL ) IVPB 500 mg        500 mg 100 mL/hr over 60 Minutes Intravenous Every 12 hours 05/29/23 1511     05/29/23 1500  vancomycin  (VANCOREADY) IVPB 2000 mg/400 mL        2,000 mg 200 mL/hr over 120 Minutes Intravenous  Once 05/29/23 1437 05/29/23 1724   05/29/23 0430  ceFEPIme  (MAXIPIME ) 2 g  in sodium chloride  0.9 % 100 mL IVPB        2 g 200 mL/hr over 30 Minutes Intravenous  Once 05/29/23 0416 05/29/23 0523       Objective: Vitals:   05/30/23 0337 05/30/23 0826  BP: (!) 112/59 108/62  Pulse: 93 91  Resp: 18 18  Temp: 98.2 F (36.8 C) 97.7 F (36.5 C)  SpO2: 96% 97%    Intake/Output Summary (Last 24 hours) at 05/30/2023 1309 Last data filed at 05/29/2023 2214 Gross per 24 hour  Intake 599.93 ml  Output --  Net 599.93 ml   Filed Weights   05/29/23 0304 05/29/23 1416  Weight: 93.4 kg 89.6 kg   Weight change: -3.814 kg Body mass index is 33.9 kg/m.   Physical Exam: General exam: Pleasant, elderly Caucasian female.  Not in pain Skin: No rashes, lesions or ulcers. HEENT: Atraumatic, normocephalic, no obvious bleeding Lungs: Clear to auscultation bilaterally,  CVS: S1, S2, no murmur,   GI/Abd: Soft, nontender, nondistended, bowel sound present,   CNS: Alert, awake, oriented x 3 Psychiatry: Mood appropriate,  Extremities: No pedal edema, no calf tenderness  Data Review: I have personally reviewed the laboratory data and studies available.  F/u labs ordered Unresulted Labs (From admission, onward)     Start     Ordered   05/31/23 0500  CBC with Differential/Platelet  Daily,   R      05/30/23  0653   05/31/23 0500  Comprehensive metabolic panel  Daily,   R      05/30/23 0653   05/29/23 1857  C Difficile Quick Screen w PCR reflex  (C Difficile quick screen w PCR reflex panel )  Once, for 24 hours,   TIMED       References:    CDiff Information Tool   05/29/23 1857            Signed, Hoyt Macleod, MD [facility] 05/30/2023

## 2023-05-30 NOTE — Consult Note (Signed)
 Amanda Crawford is well-known to me.  She is a very nice 78 year old white female.  She has extensive stage small cell lung cancer.  She is having 3 cycles of chemotherapy.  The last cycle was given on 05/21/2023.  Para she apparently has issues with diverticulitis.  She started having diarrhea a couple days ago.  She got weak.  She had no fever.  There was no bleeding..  She came to the ER.  She is growing E. coli in her blood already.  She was mated on 05/29/2023.  When she came in, she was neutropenic.  It was, 0.4.  Hemoglobin 10.3.  Platelet count 48,000.  Sodium 130.  Potassium 4.3.  BUN 46 creatinine 1.35.  Calcium  10.3.  Albumin 3.4.  SGPT 62 SGOT 51.  She had a CT angiogram of the chest.  This was negative for pulmonary embolism.  However, the tumor was smaller.  She had a CT of the brain which was also negative.  She is on antibiotics with Rocephin  and vancomycin .  She is also on Flagyl .  She has had no bleeding.  There is been no rashes.  She is having some abdominal pain.  She cannot remember the last time she had a flare of over the diverticulitis.  She has had no leg swelling.  She has had no mouth sores.  Again she has tolerated chemotherapy pretty well.  We have given her a reduced dose of chemotherapy.   Her vital signs show temperature of 98.2.  Pulse 93.  Blood pressure 112/59.  Her head neck exam shows no ocular or oral lesions.  She has no adenopathy in the neck.  There is no oral lesions.  Lungs are relatively clear bilaterally.  She has decent air movement bilaterally.  Cardiac exam regular rate and rhythm with no murmurs, rubs or bruits.  Abdomen is soft.  Bowel sounds are present.  Bowel sounds are little high-pitched.  She has some tenderness in the left lower quadrant.  There is no palpable liver or spleen tip.  Extremity shows no clubbing, cyanosis or edema.  Neurological exam shows no focal neurological deficits.  Skin exam shows no rashes.  Amanda Crawford is a very nice  78 year old white female.  She has extensive stage small cell lung cancer.  She is responding.  Her last PET scan showed that she was responding.  Recent CT angiogram of the chest shows that she is still responding..  She is neutropenic.  I will get her started on Neupogen.  I know that she has had Neulasta  with the chemotherapy.  She is growing E. coli in the blood.  We will have to see what the sensitivities are.  I would think it should probably be pansensitive.  I will ask her what to do about the diverticulitis.  I do not think that chemotherapy had anything to do with the diverticulitis flaring up.  We will follow her along.  I know that she will get fantastic care from everybody upon 6 E.  Rayleen Cal, MD  [place] Cornea 1:5

## 2023-05-31 ENCOUNTER — Telehealth (HOSPITAL_COMMUNITY): Payer: Self-pay | Admitting: Pharmacy Technician

## 2023-05-31 ENCOUNTER — Other Ambulatory Visit (HOSPITAL_COMMUNITY): Payer: Self-pay

## 2023-05-31 DIAGNOSIS — K651 Peritoneal abscess: Secondary | ICD-10-CM

## 2023-05-31 DIAGNOSIS — K5792 Diverticulitis of intestine, part unspecified, without perforation or abscess without bleeding: Secondary | ICD-10-CM | POA: Diagnosis not present

## 2023-05-31 DIAGNOSIS — R5081 Fever presenting with conditions classified elsewhere: Secondary | ICD-10-CM

## 2023-05-31 DIAGNOSIS — A0472 Enterocolitis due to Clostridium difficile, not specified as recurrent: Secondary | ICD-10-CM | POA: Diagnosis not present

## 2023-05-31 DIAGNOSIS — A4151 Sepsis due to Escherichia coli [E. coli]: Secondary | ICD-10-CM | POA: Diagnosis not present

## 2023-05-31 LAB — CULTURE, BLOOD (ROUTINE X 2)
Special Requests: ADEQUATE
Special Requests: ADEQUATE

## 2023-05-31 LAB — CBC WITH DIFFERENTIAL/PLATELET
Abs Immature Granulocytes: 0 10*3/uL (ref 0.00–0.07)
Basophils Absolute: 0 10*3/uL (ref 0.0–0.1)
Basophils Relative: 1 %
Eosinophils Absolute: 0 10*3/uL (ref 0.0–0.5)
Eosinophils Relative: 1 %
HCT: 24.8 % — ABNORMAL LOW (ref 36.0–46.0)
Hemoglobin: 8.3 g/dL — ABNORMAL LOW (ref 12.0–15.0)
Lymphocytes Relative: 84 %
Lymphs Abs: 0.3 10*3/uL — ABNORMAL LOW (ref 0.7–4.0)
MCH: 33.2 pg (ref 26.0–34.0)
MCHC: 33.5 g/dL (ref 30.0–36.0)
MCV: 99.2 fL (ref 80.0–100.0)
Monocytes Absolute: 0 10*3/uL — ABNORMAL LOW (ref 0.1–1.0)
Monocytes Relative: 7 %
Neutro Abs: 0 10*3/uL — CL (ref 1.7–7.7)
Neutrophils Relative %: 7 %
Platelets: 15 10*3/uL — CL (ref 150–400)
RBC: 2.5 MIL/uL — ABNORMAL LOW (ref 3.87–5.11)
RDW: 16.8 % — ABNORMAL HIGH (ref 11.5–15.5)
WBC: 0.4 10*3/uL — CL (ref 4.0–10.5)
nRBC: 0 % (ref 0.0–0.2)

## 2023-05-31 LAB — GLUCOSE, CAPILLARY
Glucose-Capillary: 115 mg/dL — ABNORMAL HIGH (ref 70–99)
Glucose-Capillary: 130 mg/dL — ABNORMAL HIGH (ref 70–99)
Glucose-Capillary: 97 mg/dL (ref 70–99)
Glucose-Capillary: 106 mg/dL — ABNORMAL HIGH (ref 70–99)
Glucose-Capillary: 103 mg/dL — ABNORMAL HIGH (ref 70–99)

## 2023-05-31 LAB — COMPREHENSIVE METABOLIC PANEL WITH GFR
ALT: 38 U/L (ref 0–44)
AST: 27 U/L (ref 15–41)
Albumin: 2.2 g/dL — ABNORMAL LOW (ref 3.5–5.0)
Alkaline Phosphatase: 69 U/L (ref 38–126)
Anion gap: 9 (ref 5–15)
BUN: 36 mg/dL — ABNORMAL HIGH (ref 8–23)
CO2: 18 mmol/L — ABNORMAL LOW (ref 22–32)
Calcium: 9 mg/dL (ref 8.9–10.3)
Chloride: 106 mmol/L (ref 98–111)
Creatinine, Ser: 1 mg/dL (ref 0.44–1.00)
GFR, Estimated: 57 mL/min — ABNORMAL LOW (ref >60)
Glucose, Bld: 112 mg/dL — ABNORMAL HIGH (ref 70–99)
Potassium: 2.8 mmol/L — ABNORMAL LOW (ref 3.5–5.1)
Sodium: 133 mmol/L — ABNORMAL LOW (ref 135–145)
Total Bilirubin: 0.9 mg/dL (ref 0.0–1.2)
Total Protein: 5 g/dL — ABNORMAL LOW (ref 6.5–8.1)

## 2023-05-31 LAB — PATHOLOGIST SMEAR REVIEW

## 2023-05-31 MED ORDER — GERHARDT'S BUTT CREAM
TOPICAL_CREAM | Freq: Three times a day (TID) | CUTANEOUS | Status: DC
  Administered 2023-05-31 – 2023-06-08 (×5): 1 via TOPICAL
  Administered 2023-06-10: 3 via TOPICAL
  Administered 2023-06-10 – 2023-06-12 (×2): 1 via TOPICAL
  Filled 2023-05-31 (×4): qty 60

## 2023-05-31 MED ORDER — FIDAXOMICIN 200 MG PO TABS
200.0000 mg | ORAL_TABLET | Freq: Two times a day (BID) | ORAL | Status: AC
  Administered 2023-05-31 – 2023-06-09 (×19): 200 mg via ORAL
  Filled 2023-05-31 (×20): qty 1

## 2023-05-31 MED ORDER — DICYCLOMINE HCL 10 MG PO CAPS
10.0000 mg | ORAL_CAPSULE | Freq: Every day | ORAL | Status: DC | PRN
  Administered 2023-05-31: 10 mg via ORAL
  Filled 2023-05-31 (×3): qty 1

## 2023-05-31 MED ORDER — VANCOMYCIN HCL 125 MG PO CAPS
125.0000 mg | ORAL_CAPSULE | Freq: Every day | ORAL | Status: DC
  Administered 2023-05-31: 125 mg via ORAL
  Filled 2023-05-31: qty 1

## 2023-05-31 MED ORDER — POTASSIUM CHLORIDE 10 MEQ/100ML IV SOLN
10.0000 meq | Freq: Once | INTRAVENOUS | Status: AC
  Administered 2023-05-31: 10 meq via INTRAVENOUS
  Filled 2023-05-31: qty 100

## 2023-05-31 MED ORDER — DICYCLOMINE HCL 10 MG PO CAPS
10.0000 mg | ORAL_CAPSULE | Freq: Once | ORAL | Status: AC
  Administered 2023-05-31: 10 mg via ORAL
  Filled 2023-05-31: qty 1

## 2023-05-31 MED ORDER — FIDAXOMICIN 200 MG PO TABS
200.0000 mg | ORAL_TABLET | Freq: Two times a day (BID) | ORAL | Status: DC
Start: 2023-05-31 — End: 2023-05-31

## 2023-05-31 MED ORDER — POTASSIUM CHLORIDE 10 MEQ/100ML IV SOLN
10.0000 meq | INTRAVENOUS | Status: AC
Start: 2023-05-31 — End: 2023-05-31
  Administered 2023-05-31 (×5): 10 meq via INTRAVENOUS
  Filled 2023-05-31 (×5): qty 100

## 2023-05-31 MED ORDER — POTASSIUM CHLORIDE CRYS ER 20 MEQ PO TBCR
40.0000 meq | EXTENDED_RELEASE_TABLET | Freq: Once | ORAL | Status: AC
  Administered 2023-05-31: 40 meq via ORAL
  Filled 2023-05-31: qty 2

## 2023-05-31 MED ORDER — METRONIDAZOLE 500 MG/100ML IV SOLN
500.0000 mg | Freq: Two times a day (BID) | INTRAVENOUS | Status: DC
  Administered 2023-05-31 – 2023-06-06 (×13): 500 mg via INTRAVENOUS
  Filled 2023-05-31 (×13): qty 100

## 2023-05-31 MED ORDER — POTASSIUM CHLORIDE 10 MEQ/100ML IV SOLN: 10.0000 meq | Freq: Once | INTRAVENOUS | Status: DC

## 2023-05-31 NOTE — Progress Notes (Signed)
 PROGRESS NOTE  Amanda Crawford  DOB: Jul 18, 1945  PCP: Neda Balk, MD RUE:[id number]  DOA: 05/29/2023  LOS: 2 days  Hospital Day: 3  Brief narrative: Amanda Crawford is a 78 y.o. female with PMH significant for small cell lung cancer, prior renal cell cancer with lung mets, DM2, CKD, recently hospitalized in March for septic shock diverticulitis and C. difficile infection, discharged on oral Augmentin  and oral vancomycin . With the completion of antibiotics, patient's symptoms improved but few days ago, started again 4/29, patient presented to the ED with complaint of persistent watery diarrhea weakness, poor oral intake, confusion.  Initial labs with WBC count significantly low at 0.4, hemoglobin 10.9, platelet 48, sodium 130, BUN/creatinine 46/1.35, troponin elevated to 170 Chest x-ray, CT head unremarkable for acute pathology CT angio chest did not show any evidence of pulm embolism, showed right lower lobe mass, liver cirrhosis. Started on broad-spectrum antibiotics for neutropenia  Admitted to [facility] Blood culture sent on admission is growing E. coli, pending sensitivity 4/30, C. difficile PCR positive 4/30, CT abdomen and pelvis showed sigmoid diverticulitis, 3.5 X 4.4 X 5.6 cm abscess.  Subjective: Patient was seen and examined this morning. Sitting up in recliner.  Husband at bedside. No nausea vomiting.  Continues to have diarrhea. We discussed about positive C. difficile test from yesterday and CT abdomen showing sigmoid diverticular abscess.  Assessment and plan: Sepsis POA Sigmoid diverticulitis and abscess E. coli bacteremia Immunocompromise status ID and general surgery consulted  Currently on IV Rocephin , and IV Flagyl . Repeat WBC count Neutropenic precautions Recent Labs  Lab 05/29/23 0303 05/29/23 0316 05/31/23 0400  WBC 0.4*  --  0.4*  LATICACIDVEN  --  1.7  --    Acute diarrhea  Recent C. difficile colitis Tested positive for C. difficile 3/18 on  recent admission and was treated with oral vancomycin  Presented with recurrence of diarrhea, poor oral intake Continues to have loose bowel movement.  4/30, C. difficile PCR and antigen positive. Continue oral vancomycin .  Extensive stage small cell lung cancer on chemotherapy under the care of Dr. Maria Shiner Per oncology note, she had 3 cycles of chemotherapy, last on 05/21/2023.    Neutropenia Due to chemotherapy.  Neupogen  added by oncology Neutropenic precautions  Chronic macrocytic anemia Hemoglobin stable.  Continue to monitor Continue iron supplement Was also on aspirin .  I would hold it given chronic anemia Recent Labs    09/07/22 1110 10/12/22 1036 02/08/23 1110 03/14/23 1116 04/16/23 1321 04/30/23 0910 05/21/23 1003 05/29/23 0303 05/31/23 0400  HGB  --    < > 13.0   < > 9.7* 9.9* 8.8* 10.3* 8.3*  MCV  --    < > 101.7*   < > 102.3* 104.5* 105.7* 97.4 99.2  VITAMINB12 612  --  784  --   --   --   --   --   --    < > = values in this interval not displayed.    Thrombocytopenia Platelet level significantly low at 15 today.  No active bleeding Switch to SCDs for DVT prophylaxis.  Monitor Recent Labs  Lab 05/29/23 0303 05/31/23 0400  PLT 48* 15*    Type 2 diabetes mellitus A1c 7.6 on 02/08/2023 PTA meds-Semglee  nightly.  No longer on glimepiride  because of hypoglycemia episodes recently Currently on SSI/Accu-Cheks Recent Labs  Lab 05/30/23 1138 05/30/23 1713 05/30/23 2223 05/31/23 0721 05/31/23 1241  GLUCAP 125* 86 103* 115* 130*   CKD 3B Acute metabolic acidosis Renal function at  baseline.  Continue to monitor Serum bicarb level was low at 19 on admission, likely due to diarrhea.  Continues to remain low.  Continue to monitor  Recent Labs    04/08/23 0305 04/09/23 0335 04/10/23 0233 04/11/23 0242 04/12/23 0333 04/16/23 1321 04/30/23 0910 05/21/23 1003 05/29/23 0303 05/31/23 0400  BUN 56* 37* 28* 23 20 15 22  28* 46* 36*  CREATININE 1.64* 1.40*  1.45* 1.27* 1.33* 1.24* 1.53* 1.59* 1.35* 1.00  CO2 18* 14* 18* 17* 18* 26 26 25  19* 18*   Hypertension PTA meds- Coreg  half tab of 3.125 mg twice daily I would hold Coreg  given low blood pressure, poor oral intake.  Liver cirrhosis new finding on CT scan during her last hospital stay, will need outpatient GI follow-up for this   Hyperlipidemia Would hold statin due to liver cirrhosis.   Mobility: Impaired mobility due to chemo.  Would benefit from PT eval  Goals of care   Code Status: Full Code     DVT prophylaxis:  Place and maintain sequential compression device Start: 05/31/23 0835   Antimicrobials: IV Rocephin , IV Flagyl , oral vancomycin  Fluid: NS at 75 mL/h to continue Consultants: Oncology, ID Family Communication: Husband at bedside  Status: Inpatient Level of care:  Telemetry Medical   Patient is from: Home Needs to continue in-hospital care: Ongoing workup for diarrhea Anticipated d/c to: Pending clinical course, pending PT eval   Diet:  Diet Order             Diet NPO time specified Except for: Sips with Meds  Diet effective now                   Scheduled Meds:  filgrastim  (NIVESTYM ) SQ  480 mcg Subcutaneous Daily   Amanda Crawford   Topical TID   insulin  aspart  0-15 Units Subcutaneous TID WC   insulin  aspart  0-5 Units Subcutaneous QHS   insulin  glargine-yfgn  14 Units Subcutaneous QHS   vancomycin   125 mg Oral Daily    PRN meds: acetaminophen  **OR** acetaminophen , albuterol , morphine  injection, ondansetron  **OR** ondansetron  (ZOFRAN ) IV   Infusions:   sodium chloride  Stopped (05/31/23 0327)   cefTRIAXone  (ROCEPHIN )  IV Stopped (05/30/23 2317)   metronidazole  Stopped (05/31/23 1157)   potassium chloride  10 mEq (05/31/23 1217)    Antimicrobials: Anti-infectives (From admission, onward)    Start     Dose/Rate Route Frequency Ordered Stop   05/31/23 1600  vancomycin  (VANCOREADY) IVPB 1250 mg/250 mL  Status:  Discontinued         1,250 mg 166.7 mL/hr over 90 Minutes Intravenous Every 48 hours 05/29/23 1437 05/30/23 1416   05/31/23 1345  vancomycin  (VANCOCIN ) capsule 125 mg        125 mg Oral Daily 05/31/23 1253     05/31/23 1000  fidaxomicin  (DIFICID ) tablet 200 mg  Status:  Discontinued        200 mg Oral 2 times daily 05/31/23 0812 05/31/23 0813   05/31/23 1000  metroNIDAZOLE  (FLAGYL ) IVPB 500 mg        500 mg 100 mL/hr over 60 Minutes Intravenous Every 12 hours 05/31/23 0813     05/29/23 2300  cefTRIAXone  (ROCEPHIN ) 2 g in sodium chloride  0.9 % 100 mL IVPB        2 g 200 mL/hr over 30 Minutes Intravenous Every 24 hours 05/29/23 2211     05/29/23 1700  ceFEPIme  (MAXIPIME ) 2 g in sodium chloride  0.9 % 100 mL IVPB  Status:  Discontinued        2 g 200 mL/hr over 30 Minutes Intravenous Every 12 hours 05/29/23 1437 05/29/23 2211   05/29/23 1600  metroNIDAZOLE  (FLAGYL ) IVPB 500 mg  Status:  Discontinued        500 mg 100 mL/hr over 60 Minutes Intravenous Every 12 hours 05/29/23 1511 05/31/23 0812   05/29/23 1500  vancomycin  (VANCOREADY) IVPB 2000 mg/400 mL        2,000 mg 200 mL/hr over 120 Minutes Intravenous  Once 05/29/23 1437 05/29/23 1724   05/29/23 0430  ceFEPIme  (MAXIPIME ) 2 g in sodium chloride  0.9 % 100 mL IVPB        2 g 200 mL/hr over 30 Minutes Intravenous  Once 05/29/23 0416 05/29/23 0523       Objective: Vitals:   05/30/23 2222 05/31/23 0354  BP: (!) 124/58 124/63  Pulse: 91 99  Resp: 19 20  Temp: 98 F (36.7 C) 97.9 F (36.6 C)  SpO2: 94% 94%    Intake/Output Summary (Last 24 hours) at 05/31/2023 1326 Last data filed at 05/31/2023 0348 Gross per 24 hour  Intake 1603.73 ml  Output --  Net 1603.73 ml   Filed Weights   05/29/23 0304 05/29/23 1416  Weight: 93.4 kg 89.6 kg   Weight change:  Body mass index is 33.9 kg/m.   Physical Exam: General exam: Pleasant, elderly Caucasian female.  Not in pain Skin: No rashes, lesions or ulcers. HEENT: Atraumatic, normocephalic, no obvious  bleeding Lungs: Clear to auscultation bilaterally CVS: S1, S2, no murmur,   GI/Abd: Soft, mild tenderness present in lower abdomen, nondistended, bowel sound present,   CNS: Alert, awake, oriented x 3 Psychiatry: Mood appropriate,  Extremities: No pedal edema, no calf tenderness  Data Review: I have personally reviewed the laboratory data and studies available.  F/u labs ordered Unresulted Labs (From admission, onward)     Start     Ordered   05/31/23 1218  Culture, blood (Routine X 2) w Reflex to ID Panel  BLOOD CULTURE X 2,   R (with TIMED occurrences)      05/31/23 1218   05/31/23 0500  CBC with Differential/Platelet  Daily,   R      05/30/23 0653   05/31/23 0500  Comprehensive metabolic panel  Daily,   R      05/30/23 0653            Signed, Hoyt Macleod, MD [facility] 05/31/2023

## 2023-05-31 NOTE — Consult Note (Signed)
 [facility]    Date of Admission:  05/29/2023     Reason for Consult: neutropenic fever, diverticulitis/abscess, ecoli bsi    Referring Provider: Dahal     Lines:  Right chest port-a-cath  Abx: 5/1-c fidaxomycin 4/29-c ceftriaxone       4/29-c metronidazole   Outpatient augmentin   Assessment: 78 yo female with small cell lung cancer (mets to hip), hx [facility]cc pulm mets (left nephrectomy 2001), dm2, ckd3, dm2, diverticulitis 03/2023 admission finished augmentin  course complicated by cdiff at that time s/p 2 weeks po vanc, cirrhosis (imaging finding 03/2023), on chemo (last dose/cycle on 05/21/23), admitted with neutropenic fever, found to have ecoli bsi and intraabd abscess, along with new onset severe diarrhea   #cdiff Has always just had cdiff pcr not toxin positivity Hx does however in march and also this admission suggestive of cdiff infection Will trial fidaxamycin tapered course for recurrent cdiff; anticipate to be around 5 weeks. This should hopefully cover secondary prophylaxis duration for the systemic abx required for intraabd abscess  #diverticulitis #intraabd abscess #ecoli bsi #neutropenic fever Iv abx-->augmentin  short course finished 03/3023 Still imaging finding diverticulitis and abscess Abd pain all over ?cdiff related Suspect source for ecoli bsi is abscess.   One question remains is if the port has been seeded. Would not be able to tell. However reassuring that her sepsis had resolved. Plan to repeat bcx and if negative to treat for bsi/intraabd abscess. If recurrent ecoli bsi after diverticulitis/abscess resolve then port needs to be removed  Discussed with id pharmacy [facility]. Augmentin  mic on the higher side for ecoli but still sensitive. Will treat for 2 weeks if iv ceftriaxone  (will cover duration of bsi) and then transition to long augmentin  course (3-4 more weeks)   Will need secondary prophylaxis cdiff  coverage while on systemic abx   Of note - again her cdiff dx has been based on pcr alone (toxin negative). Checkpoint inhibitor can cause colitis or other autoimmune process. So if no response of diarrhea to cdiff tx need to consider this possibility     Plan: Continue ceftriaxone ; plan 2 weeks from 5/1 Continue metronidazole  (for abscess/diverticulitis) On 5/15 transition ceftriaxone  to augmentin  and can stop flagyl ; plan 4 weeks Start cdiff treatment with fidaxomycin; plan tapered course of 7 weeks (to extend a week beyond diverticular abscess treatment) Maintain enteric isolation precaution for the duration of admission Discussed with primary team     ------------------------------------------------ Principal Problem:   Sepsis (HCC) Active Problems:   Neutropenia (HCC)   Enteritis due to Clostridium difficile    HPI: Amanda Crawford is a 78 y.o. female with small cell lung cancer (mets to hip), hx rcc pulm mets, dm2, ckd3, dm2, diverticulitis 03/2023 admission finished augmentin  course complicated by cdiff at that time s/p 2 weeks po vanc, cirrhosis (imaging finding 03/2023), on chemo (last dose/cycle on 05/21/23), admitted with neutropenic fever, found to have ecoli bsi and intraabd abscess, along with new onset severe diarrhea   Patient had cdiff in march and treated with vanc. Diarrhea 6 times a day on average resolved  Gettign chemo for sclc 4/21 last cycle currently with neutropenia  Came in feeling weak/malaise, abd pain generalized, and 2 days of recurrent severe diarrhea 6 times a day at least   Bcx here grew ecoli Abd pelv ct showed 4 cm intraabd abscess and diverticulitis; abscess next to sigmoid colon  Clinically improving on ceftriaxone /flagyl  but still with significant  diarrhea and abd pain  Has a port  Reviewed onc note: Principle Diagnosis:  Small cell lung cancer -- Extebsive stage Renal cell carcinoma -- Clear cell   Current Therapy:        S/p  LEFT nephrectomy - 2001 Carbo/VP-16/Durvalumab  -- s/p cycle #2- start on 03/26/2023    Family History  Problem Relation Age of Onset   Dementia Mother    Heart failure Father    Diabetes Father    Hyperlipidemia Father    Heart disease Father    Hypertension Father    Asthma Brother    Asthma Daughter    Alcohol abuse Maternal Aunt    Colon cancer Paternal Aunt    Cancer Paternal Grandmother    Hearing loss Paternal Grandfather    Stroke Paternal Grandfather     Social History   Tobacco Use   Smoking status: Former    Current packs/day: 0.00    Average packs/day: 0.5 packs/day for 35.0 years (17.5 ttl pk-yrs)    Types: Cigarettes    Start date: 05/08/1971    Quit date: 05/08/2006    Years since quitting: 17.0   Smokeless tobacco: Never  Vaping Use   Vaping status: Never Used  Substance Use Topics   Alcohol use: Yes    Comment: seldomly   Drug use: No    Allergies  Allergen Reactions   Culturelle Probiotics [Lactobacillus]     Patient should not take probiotics while immunocompromised from chemo    Bactrim  Ds [Sulfamethoxazole -Trimethoprim ] Rash    Review of Systems: ROS All Other ROS was negative, except mentioned above   Past Medical History:  Diagnosis Date   Allergic state 03/12/2015   Arthritis    Cirrhosis (HCC)    Colon polyp 11/19/2014   Dermatitis 03/12/2015   Diarrhea 06/29/2016   Diverticulitis    DM (diabetes mellitus), type 2 (HCC) 06/29/2016   GERD (gastroesophageal reflux disease)    occ   Gout 03/12/2015   H/O measles    H/O mumps    Headache(784.0)    migraines   History of chicken pox 11/23/2014   Hyperglycemia 06/29/2016   Insomnia 02/13/2023   Lung cancer (HCC)    Migraine 11/23/2014   Obesity 11/23/2014   Pneumonia    child   Preventative health care 09/12/2015   Primary hypertension 12/15/2020   Renal cell cancer (HCC)    renal cell ca dx 9/01 and 8/08;   Renal insufficiency    Shortness of breath    occ   Skin  lesion of breast 03/12/2015   Sleep apnea 01/24/2023   recently dx 01/24/23 - CPAP ordered by not received yet   Small cell carcinoma of middle lobe of right lung (HCC) 03/14/2023   Small cell lung cancer, right middle lobe (HCC) 03/14/2023       Scheduled Meds:  fidaxomicin   200 mg Oral BID   filgrastim  (NIVESTYM ) SQ  480 mcg Subcutaneous Daily   Gerhardt's butt cream   Topical TID   insulin  aspart  0-15 Units Subcutaneous TID WC   insulin  aspart  0-5 Units Subcutaneous QHS   insulin  glargine-yfgn  14 Units Subcutaneous QHS   Continuous Infusions:  sodium chloride  Stopped (05/31/23 0327)   cefTRIAXone  (ROCEPHIN )  IV Stopped (05/30/23 2317)   metronidazole  Stopped (05/31/23 1157)   potassium chloride  10 mEq (05/31/23 1338)   PRN Meds:.acetaminophen  **OR** acetaminophen , albuterol , morphine  injection, ondansetron  **OR** ondansetron  (ZOFRAN ) IV   OBJECTIVE: Blood pressure 124/63, pulse 99, temperature 97.9  F (36.6 C), temperature source Oral, resp. rate 20, height 5\' 4"  (1.626 m), weight 89.6 kg, SpO2 94%.  Physical Exam  General/constitutional: no distress, pleasant HEENT: Normocephalic, PER, Conj Clear, EOMI, Oropharynx clear Neck supple CV: rrr no mrg Lungs: clear to auscultation, normal respiratory effort Abd: Soft, moderate tenderness diffusely; no guarding/rebound Ext: no edema Skin: No Rash Neuro: nonfocal MSK: no peripheral joint swelling/tenderness/warmth; back spines nontender  Central line- right port-a-cath site currently accessed -- no purulence/tenderness/erythema/fluctuance   Lab Results Lab Results  Component Value Date   WBC 0.4 (LL) 05/31/2023   HGB 8.3 (L) 05/31/2023   HCT 24.8 (L) 05/31/2023   MCV 99.2 05/31/2023   PLT 15 (LL) 05/31/2023    Lab Results  Component Value Date   CREATININE 1.00 05/31/2023   BUN 36 (H) 05/31/2023   NA 133 (L) 05/31/2023   K 2.8 (L) 05/31/2023   CL 106 05/31/2023   CO2 18 (L) 05/31/2023    Lab Results   Component Value Date   ALT 38 05/31/2023   AST 27 05/31/2023   ALKPHOS 69 05/31/2023   BILITOT 0.9 05/31/2023      Microbiology: Recent Results (from the past 240 hours)  Blood culture (routine x 2)     Status: Abnormal   Collection Time: 05/29/23  4:17 AM   Specimen: BLOOD  Result Value Ref Range Status   Specimen Description   Final    BLOOD BLOOD RIGHT HAND Performed at [facility], [address] [place], [place] [address]    Special Requests   Final    BOTTLES DRAWN AEROBIC AND ANAEROBIC Blood Culture adequate volume Performed at [facility], [address] [place], [place] [address]    Culture  Setup Time   Final    GRAM NEGATIVE RODS AEROBIC BOTTLE ONLY CRITICAL RESULT CALLED TO, READ BACK BY AND VERIFIED WITH: PHARMD E. JACKSON [id number] @ 2203 FH Performed at [facility], [address], [place], [place] [address]    Culture ESCHERICHIA COLI (A)  Final   Report Status 05/31/2023 FINAL  Final   Organism ID, Bacteria ESCHERICHIA COLI  Final   Organism ID, Bacteria ESCHERICHIA COLI  Final      Susceptibility   Escherichia coli - KIRBY BAUER*    CEFAZOLIN  INTERMEDIATE Intermediate    Escherichia coli - MIC*    AMPICILLIN >=32 RESISTANT Resistant     CEFEPIME  <=0.12 SENSITIVE Sensitive     CEFTAZIDIME <=1 SENSITIVE Sensitive     CEFTRIAXONE  <=0.25 SENSITIVE Sensitive     CIPROFLOXACIN  >=4 RESISTANT Resistant     GENTAMICIN <=1 SENSITIVE Sensitive     IMIPENEM <=0.25 SENSITIVE Sensitive     TRIMETH /SULFA  <=20 SENSITIVE Sensitive     AMPICILLIN/SULBACTAM 8 SENSITIVE Sensitive     PIP/TAZO <=4 SENSITIVE Sensitive ug/mL    * ESCHERICHIA COLI    ESCHERICHIA COLI  Blood Culture ID Panel (Reflexed)     Status: Abnormal   Collection Time: 05/29/23  4:17 AM  Result Value Ref Range Status   Enterococcus faecalis NOT DETECTED NOT DETECTED Final   Enterococcus Faecium NOT DETECTED NOT DETECTED Final   Listeria monocytogenes NOT DETECTED NOT  DETECTED Final   Staphylococcus species NOT DETECTED NOT DETECTED Final   Staphylococcus aureus (BCID) NOT DETECTED NOT DETECTED Final   Staphylococcus epidermidis NOT DETECTED NOT DETECTED Final   Staphylococcus lugdunensis NOT DETECTED NOT DETECTED Final   Streptococcus species NOT DETECTED NOT DETECTED Final  Streptococcus agalactiae NOT DETECTED NOT DETECTED Final   Streptococcus pneumoniae NOT DETECTED NOT DETECTED Final   Streptococcus pyogenes NOT DETECTED NOT DETECTED Final   A.calcoaceticus-baumannii NOT DETECTED NOT DETECTED Final   Bacteroides fragilis NOT DETECTED NOT DETECTED Final   Enterobacterales DETECTED (A) NOT DETECTED Final    Comment: Enterobacterales represent a large order of gram negative bacteria, not a single organism. CRITICAL RESULT CALLED TO, READ BACK BY AND VERIFIED WITH: PHARMD E. JACKSON [id number] @ 2203 FH    Enterobacter cloacae complex NOT DETECTED NOT DETECTED Final   Escherichia coli DETECTED (A) NOT DETECTED Final    Comment: CRITICAL RESULT CALLED TO, READ BACK BY AND VERIFIED WITH: PHARMD E. JACKSON [id number] @ 2203 FH    Klebsiella aerogenes NOT DETECTED NOT DETECTED Final   Klebsiella oxytoca NOT DETECTED NOT DETECTED Final   Klebsiella pneumoniae NOT DETECTED NOT DETECTED Final   Proteus species NOT DETECTED NOT DETECTED Final   Salmonella species NOT DETECTED NOT DETECTED Final   Serratia marcescens NOT DETECTED NOT DETECTED Final   Haemophilus influenzae NOT DETECTED NOT DETECTED Final   Neisseria meningitidis NOT DETECTED NOT DETECTED Final   Pseudomonas aeruginosa NOT DETECTED NOT DETECTED Final   Stenotrophomonas maltophilia NOT DETECTED NOT DETECTED Final   Candida albicans NOT DETECTED NOT DETECTED Final   Candida auris NOT DETECTED NOT DETECTED Final   Candida glabrata NOT DETECTED NOT DETECTED Final   Candida krusei NOT DETECTED NOT DETECTED Final   Candida parapsilosis NOT DETECTED NOT DETECTED Final   Candida tropicalis NOT  DETECTED NOT DETECTED Final   Cryptococcus neoformans/gattii NOT DETECTED NOT DETECTED Final   CTX-M ESBL NOT DETECTED NOT DETECTED Final   Carbapenem resistance IMP NOT DETECTED NOT DETECTED Final   Carbapenem resistance KPC NOT DETECTED NOT DETECTED Final   Carbapenem resistance NDM NOT DETECTED NOT DETECTED Final   Carbapenem resist OXA 48 LIKE NOT DETECTED NOT DETECTED Final   Carbapenem resistance VIM NOT DETECTED NOT DETECTED Final    Comment: Performed at [facility], [address] [place] [address], [place], [place] [address]  Blood culture (routine x 2)     Status: Abnormal   Collection Time: 05/29/23  4:22 AM   Specimen: BLOOD  Result Value Ref Range Status   Specimen Description   Final    BLOOD LEFT ANTECUBITAL Performed at [facility], [address] [place], [place] [address]    Special Requests   Final    BOTTLES DRAWN AEROBIC AND ANAEROBIC Blood Culture adequate volume Performed at [facility], [address] [place], [place] [address]    Culture  Setup Time   Final    GRAM NEGATIVE RODS IN BOTH AEROBIC AND ANAEROBIC BOTTLES CRITICAL VALUE NOTED.  VALUE IS CONSISTENT WITH PREVIOUSLY REPORTED AND CALLED VALUE.    Culture (A)  Final    ESCHERICHIA COLI SUSCEPTIBILITIES PERFORMED ON PREVIOUS CULTURE WITHIN THE LAST 5 DAYS. Performed at [facility], [address] [place], [place] [address]    Report Status 05/31/2023 FINAL  Final  C Difficile Quick Screen w PCR reflex     Status: Abnormal   Collection Time: 05/30/23 12:45 PM   Specimen: STOOL  Result Value Ref Range Status   C Diff antigen POSITIVE (A) NEGATIVE Final   C Diff toxin NEGATIVE NEGATIVE Final   C Diff interpretation Results are indeterminate. See PCR results.  Final    Comment: Performed at [facility], [address] [place], [place] [address]  C. Diff by PCR, Reflexed     Status: Abnormal   Collection Time: 05/30/23 12:45 PM  Result Value Ref Range  Status   Toxigenic C. Difficile by PCR POSITIVE (A) NEGATIVE Final    Comment: Positive for toxigenic C. difficile with little to no toxin production. Only treat if clinical presentation suggests symptomatic illness. Performed at [facility], [address], [place], [place] [address]      Serology:    Imaging: If present, new imagings (plain films, ct scans, and mri) have been personally visualized and interpreted; radiology reports have been reviewed. Decision making incorporated into the Impression / Recommendations.  05/30/23 ct abd pelv with contrast IMPRESSION: 1. Sigmoid diverticulitis. 2. 4.4 x 5.6 x 3.5 cm fluid collection adjacent to the inflamed sigmoid colon compatible with abscess. 3. Mild right-sided hydronephrosis and hydroureter to the level of the right UVJ. No obstructing stone or mass lesion is evident. This may represent a recently passed stone. 4. Left nephrectomy. 5. Multilevel degenerative changes in the lumbar spine. 6. Coronary artery disease.   4/29 cta chest IMPRESSION: 1. No CT evidence for acute pulmonary embolus. 2. Retro hilar right lower lobe pulmonary lesion measured previously at 2.7 x 2.3 cm is 2.6 x 1.5 cm. 3. Nodular liver contour suggests cirrhosis. 4.  Aortic Atherosclerosis    Jamesetta Mcbride, MD [facility] [phone] pager    05/31/2023, 2:24 PM

## 2023-05-31 NOTE — Evaluation (Signed)
 Physical Therapy Evaluation Patient Details Name: Amanda Crawford MRN: [id number] DOB: 1946/01/05 Today's Date: 05/31/2023  History of Present Illness  Amanda Crawford is a 78 y.o. female admitted with sepsis; 4/30 C. difficile PCR and antigen positive. PMH significant for small cell lung cancer, prior renal cell cancer with lung mets, DM2, CKD, recently hospitalized in March for septic shock diverticulitis and C. difficile infection  Clinical Impression  Pt admitted with above diagnosis. Pt reports from home with spouse, has ramp to enter home, lives on main level, daughter and son are local, no AD for community ambulation at baseline, ind with ADLs/IADLs. On eval, pt limited by diarrhea urgency, able to complete STS and step pivot to [facility] with RW and CGA for safety. Pt needing assistance with pericare while in standing. Pt returns to recliner, PA into room, all needs in reach. Pt demonstrates good AROM, generalized weakness throughout BLE, reports numbness/tingling throughout BLE. Pt's spouse and daughter present during part of eval, stepped out during toileting; anticipate HHPT with family support at d/c. Pt currently with functional limitations due to the deficits listed below (see PT Problem List). Pt will benefit from acute skilled PT to increase their independence and safety with mobility to allow discharge.           If plan is discharge home, recommend the following: A little help with walking and/or transfers;A little help with bathing/dressing/bathroom;Assistance with cooking/housework;Assist for transportation;Help with stairs or ramp for entrance   Can travel by private vehicle        Equipment Recommendations None recommended by PT  Recommendations for Other Services       Functional Status Assessment Patient has had a recent decline in their functional status and demonstrates the ability to make significant improvements in function in a reasonable and predictable amount of time.      Precautions / Restrictions Precautions Precautions: Fall Recall of Precautions/Restrictions: Intact Precaution/Restrictions Comments: enteric Restrictions Weight Bearing Restrictions Per Provider Order: No      Mobility  Bed Mobility               General bed mobility comments: in recliner upon arrival    Transfers Overall transfer level: Needs assistance Equipment used: Rolling walker (2 wheels) Transfers: Sit to/from Stand, Bed to chair/wheelchair/BSC Sit to Stand: Contact guard assist   Step pivot transfers: Contact guard assist       General transfer comment: verbal cues for hand placement with transfer, CGA to power to stand and for step pivot recliner<>BSC    Ambulation/Gait               General Gait Details: diarrhea urgency limiting  Stairs            Wheelchair Mobility     Tilt Bed    Modified Rankin (Stroke Patients Only)       Balance Overall balance assessment: Needs assistance         Standing balance support: Reliant on assistive device for balance, During functional activity, Bilateral upper extremity supported Standing balance-Leahy Scale: Poor Standing balance comment: static without UE support, dynamic with RW                             Pertinent Vitals/Pain Pain Assessment Pain Assessment: No/denies pain    Home Living Family/patient expects to be discharged to:: Private residence Living Arrangements: Spouse/significant other Available Help at Discharge: Family;Available 24 hours/day Type of Home: House  Home Access: Ramped entrance     Alternate Level Stairs-Number of Steps: flight Home Layout: Two level;Able to live on main level with bedroom/bathroom Home Equipment: Rolling Walker (2 wheels);Grab bars - tub/shower;Shower seat;Hand held shower head      Prior Function Prior Level of Function : Independent/Modified Independent             Mobility Comments: Pt reports ind with  community amb without AD, reports 1 fall while transferring on/off toilet and was able to get up ind ADLs Comments: Pt reports ind with ADLs/IADLs     Extremity/Trunk Assessment   Upper Extremity Assessment Upper Extremity Assessment: Overall WFL for tasks assessed    Lower Extremity Assessment Lower Extremity Assessment: Generalized weakness (AROM WFL, strength grossly 3+/5, reports numbness/tingling throughout)    Cervical / Trunk Assessment Cervical / Trunk Assessment: Normal  Communication   Communication Communication: No apparent difficulties    Cognition Arousal: Alert Behavior During Therapy: WFL for tasks assessed/performed   PT - Cognitive impairments: No apparent impairments                         Following commands: Intact       Cueing       General Comments      Exercises     Assessment/Plan    PT Assessment Patient needs continued PT services  PT Problem List Decreased strength;Decreased activity tolerance;Decreased balance;Cardiopulmonary status limiting activity;Impaired sensation;Decreased skin integrity       PT Treatment Interventions DME instruction;Gait training;Functional mobility training;Therapeutic activities;Therapeutic exercise;Patient/family education    PT Goals (Current goals can be found in the Care Plan section)  Acute Rehab PT Goals Patient Stated Goal: "no more diarrhea" PT Goal Formulation: With patient/family Time For Goal Achievement: 06/14/23 Potential to Achieve Goals: Good    Frequency Min 3X/week     Co-evaluation               AM-PAC PT "6 Clicks" Mobility  Outcome Measure Help needed turning from your back to your side while in a flat bed without using bedrails?: A Little Help needed moving from lying on your back to sitting on the side of a flat bed without using bedrails?: A Little Help needed moving to and from a bed to a chair (including a wheelchair)?: A Little Help needed standing up  from a chair using your arms (e.g., wheelchair or bedside chair)?: A Little Help needed to walk in hospital room?: A Little Help needed climbing 3-5 steps with a railing? : A Lot 6 Click Score: 17    End of Session Equipment Utilized During Treatment: Gait belt Activity Tolerance: Patient tolerated treatment well;Other (comment) (diarrhea) Patient left: in chair;with call bell/phone within reach;Other (comment) (PA in room) Nurse Communication: Mobility status PT Visit Diagnosis: Muscle weakness (generalized) (M62.81);Difficulty in walking, not elsewhere classified (R26.2)    Time: 1610-9604 PT Time Calculation (min) (ACUTE ONLY): 28 min   Charges:   PT Evaluation $PT Eval Moderate Complexity: 1 Mod PT Treatments $Therapeutic Activity: 8-22 mins PT General Charges $$ ACUTE PT VISIT: 1 Visit         Tori Mabell Esguerra PT, DPT 05/31/23, 2:24 PM

## 2023-05-31 NOTE — Plan of Care (Signed)
  Problem: Education: Goal: Knowledge of General Education information will improve Description: Including pain rating scale, medication(s)/side effects and non-pharmacologic comfort measures Outcome: Progressing   Problem: Health Behavior/Discharge Planning: Goal: Ability to manage health-related needs will improve Outcome: Progressing   Problem: Clinical Measurements: Goal: Ability to maintain clinical measurements within normal limits will improve Outcome: Progressing Goal: Will remain free from infection Outcome: Progressing Goal: Diagnostic test results will improve Outcome: Progressing Goal: Respiratory complications will improve Outcome: Progressing Goal: Cardiovascular complication will be avoided Outcome: Progressing   Problem: Activity: Goal: Risk for activity intolerance will decrease Outcome: Progressing   Problem: Nutrition: Goal: Adequate nutrition will be maintained Outcome: Progressing   Problem: Coping: Goal: Level of anxiety will decrease Outcome: Progressing   Problem: Elimination: Goal: Will not experience complications related to urinary retention Outcome: Progressing   Problem: Pain Managment: Goal: General experience of comfort will improve and/or be controlled Outcome: Progressing   Problem: Safety: Goal: Ability to remain free from injury will improve Outcome: Progressing   Problem: Education: Goal: Ability to describe self-care measures that may prevent or decrease complications (Diabetes Survival Skills Education) will improve Outcome: Progressing Goal: Individualized Educational Video(s) Outcome: Progressing   Problem: Fluid Volume: Goal: Ability to maintain a balanced intake and output will improve Outcome: Progressing   Problem: Health Behavior/Discharge Planning: Goal: Ability to identify and utilize available resources and services will improve Outcome: Progressing Goal: Ability to manage health-related needs will  improve Outcome: Progressing   Problem: Metabolic: Goal: Ability to maintain appropriate glucose levels will improve Outcome: Progressing   Problem: Nutritional: Goal: Maintenance of adequate nutrition will improve Outcome: Progressing Goal: Progress toward achieving an optimal weight will improve Outcome: Progressing   Problem: Tissue Perfusion: Goal: Adequacy of tissue perfusion will improve Outcome: Progressing   Problem: Elimination: Goal: Will not experience complications related to bowel motility Outcome: Not Progressing   Problem: Skin Integrity: Goal: Risk for impaired skin integrity will decrease Outcome: Not Progressing   Problem: Coping: Goal: Ability to adjust to condition or change in health will improve Outcome: Not Progressing   Problem: Skin Integrity: Goal: Risk for impaired skin integrity will decrease Outcome: Not Progressing

## 2023-05-31 NOTE — Telephone Encounter (Signed)
 Patient Advocate Encounter  Patient is approved through the [employer] Patient Assistance Program for Dificid  through 01/30/2024.   Will be sent to Patient's Home   Morgan Arab, CPhT Pharmacy Patient Advocate Specialist [facility] Patient Advocate Team Direct Number: [phone]  Fax: [phone]

## 2023-05-31 NOTE — Consult Note (Signed)
 WOC Nurse Consult Note: Reason for Consult:MASD d/t diarrhea  Wound type: Moisture Associated Skin Damage ICD-10 CM Codes for Irritant Dermatitis L24A2 - Due to fecal, urinary or dual incontinence    Pressure Injury POA: NA, not related to pressure  Measurement: widespread around rectum, buttocks and perineal area  Wound bed: erythema with scattered partial thickness skin loss  Drainage (amount, consistency, odor)  Periwound: intact  Dressing procedure/placement/frequency: Cleanse buttocks and perineal area with soap and water and dry. Apply Vashe wound cleanser ([place] [phone]) to entire area, do not rinse and allow to air dry.  Apply Gerhardt's Butt Cream to entire area 3 times a day and prn soiling.    POC discussed with bedside nurse. WOC team will not follow. Re-consult if further needs arise.   Thank you,    Ronni Colace MSN, RN-BC, [employer] [phone]

## 2023-05-31 NOTE — Progress Notes (Signed)
 PHARMACY CONSULT NOTE FOR:  OUTPATIENT  PARENTERAL ANTIBIOTIC THERAPY (OPAT)  Indication: Intra-abdominal abscess Regimen: Ertapenem 500 mg IV every 24 hours End date: 07/09/23  IV antibiotic discharge orders are pended. To discharging provider:  please sign these orders via discharge navigator,  Select New Orders & click on the button choice - Manage This Unsigned Work.     Thank you for allowing pharmacy to be a part of this patient's care.  Garland Junk, PharmD, BCPS, BCIDP Infectious Diseases Clinical Pharmacist 06/15/2023 12:01 PM   **Pharmacist phone directory can now be found on [email or web address] ([email or web address]).  Listed under [facility].

## 2023-05-31 NOTE — Telephone Encounter (Signed)
 Patient Product/process development scientist completed.    The patient is insured through [employer]. Patient has Medicare and is not eligible for a copay card, but may be able to apply for patient assistance or Medicare RX Payment Plan (Patient Must reach out to their plan, if eligible for payment plan), if available.    Ran test claim for Dificid  200 mg and the current 10 day co-pay is $1,404.78.  Ran test claim for vancomycin  125 mg and got #80 on 04/18/2023 can only get #24 before 08/31/2023 the current 6 day co-pay is $19.41.  This test claim was processed through [facility]- copay amounts may vary at other pharmacies due to pharmacy/plan contracts, or as the patient moves through the different stages of their insurance plan.     Morgan Arab, CPHT Pharmacy Technician III Certified Patient Advocate [facility] Patient Advocate Team Direct Number: [phone]  Fax: [phone]

## 2023-05-31 NOTE — Consult Note (Signed)
 Amanda Crawford 06-15-1945  [id number].    Requesting MD: Dr. Hoyt Macleod Chief Complaint/Reason for Consult: diverticulitis with abscess, c diff colitis  HPI:  This is an 78 year old white female with a history of small cell lung cancer currently on chemotherapy with her last dose last week, cirrhosis, diabetes mellitus, GERD, obesity, hypertension, renal cell cancer status post left nephrectomy, shortness of breath secondary to prior wedge resections for her lung cancer, who was admitted in March with C. difficile colitis as well as suspected underlying diverticulitis.  She was treated with antibiotic therapy.  Her symptoms improved quite a bit at that time after discharge.  For the last month she has continued in her current, usual state of health.  Unfortunately around 3 to 4 days ago, she began developing some left lower quadrant abdominal pain again with watery diarrhea.  She has been readmitted with suspected recurrent C. difficile infection as well as diverticulitis with an abscess.  She has been started on antibiotics therapy including Rocephin , Flagyl , Dificid .  She has not been febrile here but feels subjectively she has a fever.  Her white blood cell count is 0.4 with a hemoglobin of 8.3 and a platelet count of 15,000.  We have been asked to evaluate her given her new CT scan findings.  ROS: ROS: See HPI  Family History  Problem Relation Age of Onset   Dementia Mother    Heart failure Father    Diabetes Father    Hyperlipidemia Father    Heart disease Father    Hypertension Father    Asthma Brother    Asthma Daughter    Alcohol abuse Maternal Aunt    Colon cancer Paternal Aunt    Cancer Paternal Grandmother    Hearing loss Paternal Grandfather    Stroke Paternal Grandfather     Past Medical History:  Diagnosis Date   Allergic state 03/12/2015   Arthritis    Cirrhosis (HCC)    Colon polyp 11/19/2014   Dermatitis 03/12/2015   Diarrhea 06/29/2016   Diverticulitis     DM (diabetes mellitus), type 2 (HCC) 06/29/2016   GERD (gastroesophageal reflux disease)    occ   Gout 03/12/2015   H/O measles    H/O mumps    Headache(784.0)    migraines   History of chicken pox 11/23/2014   Hyperglycemia 06/29/2016   Insomnia 02/13/2023   Lung cancer (HCC)    Migraine 11/23/2014   Obesity 11/23/2014   Pneumonia    child   Preventative health care 09/12/2015   Primary hypertension 12/15/2020   Renal cell cancer (HCC)    renal cell ca dx 9/01 and 8/08;   Renal insufficiency    Shortness of breath    occ   Skin lesion of breast 03/12/2015   Sleep apnea 01/24/2023   recently dx 01/24/23 - CPAP ordered by not received yet   Small cell carcinoma of middle lobe of right lung (HCC) 03/14/2023   Small cell lung cancer, right middle lobe (HCC) 03/14/2023    Past Surgical History:  Procedure Laterality Date   ABDOMINAL HYSTERECTOMY     ANAL RECTAL MANOMETRY N/A 06/29/2021   Procedure: ANO RECTAL MANOMETRY;  Surgeon: Annis Kinder, DO;  Location: [facility] ENDOS[facility];  Service: Gastroenterology;  Laterality: N/A;   APPENDECTOMY     BRONCHIAL BIOPSY  02/20/2023   Procedure: BRONCHIAL BIOPSIES;  Surgeon: Denson Flake, MD;  Location: [facility] ENDOSCOPY;  Service: Pulmonary;;   BRONCHIAL BRUSHINGS  02/20/2023  Procedure: BRONCHIAL BRUSHINGS;  Surgeon: Denson Flake, MD;  Location: [facility] ENDOSCOPY;  Service: Pulmonary;;   BRONCHIAL NEEDLE ASPIRATION BIOPSY  02/20/2023   Procedure: BRONCHIAL NEEDLE ASPIRATION BIOPSIES;  Surgeon: Denson Flake, MD;  Location: [facility] ENDOSCOPY;  Service: Pulmonary;;   CARDIAC CATHETERIZATION     yrs ago neg   CHOLECYSTECTOMY     COLONOSCOPY  2018   ESOPHAGOGASTRODUODENOSCOPY     years ago   FINE NEEDLE ASPIRATION  02/20/2023   Procedure: FINE NEEDLE ASPIRATION (FNA) LINEAR;  Surgeon: Denson Flake, MD;  Location: [facility] ENDOSCOPY;  Service: Pulmonary;;   IR IMAGING GUIDED PORT INSERTION  03/21/2023   IR RADIOLOGIST EVAL & MGMT  07/17/2018    IR RADIOLOGIST EVAL & MGMT  09/12/2018   IR RADIOLOGIST EVAL & MGMT  06/18/2019   IR RADIOLOGIST EVAL & MGMT  01/13/2020   IR RADIOLOGIST EVAL & MGMT  07/08/2020   IR RADIOLOGIST EVAL & MGMT  10/12/2020   KIDNEY SURGERY  2001   Removed of left kidney    LUNG CANCER SURGERY Left 2008   RADIOLOGY WITH ANESTHESIA N/A 08/21/2018   Procedure: CT WITH ANESTHESIA RENAL CRYOABLATION;  Surgeon: Marland Silvas, MD;  Location: [facility] ORS;  Service: Radiology;  Laterality: N/A;   RENAL MASS EXCISION Left 2001   RIGHT/LEFT HEART CATH AND CORONARY ANGIOGRAPHY N/A 07/27/2016   Procedure: Right/Left Heart Cath and Coronary Angiography;  Surgeon: Arleen Lacer, MD;  Location: [facility] INVASIVE CV LAB;  Service: Cardiovascular;  Laterality: N/A;   THORACOTOMY Left 05/09/2012   Procedure: THORACOTOMY MAJOR;  Surgeon: Bartley Lightning, MD;  Location: [facility] OR;  Service: Thoracic;  Laterality: Left;   VIDEO BRONCHOSCOPY N/A 05/09/2012   Procedure: VIDEO BRONCHOSCOPY;  Surgeon: Bartley Lightning, MD;  Location: [facility] OR;  Service: Thoracic;  Laterality: N/A;   VIDEO BRONCHOSCOPY WITH ENDOBRONCHIAL ULTRASOUND  02/20/2023   Procedure: VIDEO BRONCHOSCOPY WITH ENDOBRONCHIAL ULTRASOUND;  Surgeon: Denson Flake, MD;  Location: [facility] ENDOS[facility];  Service: Pulmonary;;   WEDGE RESECTION Left 05/09/2012   Procedure: LEFT UPPER LOBE WEDGE RESECTION;  Surgeon: Bartley Lightning, MD;  Location: [facility] OR;  Service: Thoracic;  Laterality: Left;    Social History:  reports that she quit smoking about 17 years ago. Her smoking use included cigarettes. She started smoking about 52 years ago. She has a 17.5 pack-year smoking history. She has never used smokeless tobacco. She reports current alcohol use. She reports that she does not use drugs.  Allergies:  Allergies  Allergen Reactions   Culturelle Probiotics [Lactobacillus]     Patient should not take probiotics while immunocompromised from chemo    Bactrim  Ds [Sulfamethoxazole -Trimethoprim ] Rash     Medications Prior to Admission  Medication Sig Dispense Refill   allopurinol  (ZYLOPRIM ) 300 MG tablet TAKE 1 TABLET BY MOUTH EVERY DAY 90 tablet 1   aspirin  EC 81 MG tablet Take 81 mg by mouth daily.     atorvastatin  (LIPITOR) 20 MG tablet TAKE 1 TABLET BY MOUTH EVERY DAY 90 tablet 1   carvedilol  (COREG ) 3.125 MG tablet Take 0.5 tablets (1.5625 mg total) by mouth 2 (two) times daily with a meal.     dexamethasone  (DECADRON ) 4 MG tablet Take 2 tablets daily for 2 days, on days 4 and 5.Take with food. Every 21 days. 30 tablet 1   diphenhydramine -acetaminophen  (TYLENOL  PM) 25-500 MG TABS Take 1 tablet by mouth at bedtime as needed (sleep).     ferrous sulfate  325 (65 FE) MG tablet  Take 325 mg by mouth at bedtime.     fluconazole  (DIFLUCAN ) 100 MG tablet Take 1 tablet (100 mg total) by mouth daily. 30 tablet 3   glimepiride  (AMARYL ) 2 MG tablet TAKE 1 TABLET BY MOUTH EVERY DAY WITH BREAKFAST 90 tablet 1   hyoscyamine  (LEVSIN SL) 0.125 MG SL tablet Place 1 tablet (0.125 mg total) under the tongue every 6 (six) hours as needed. 30 tablet 1   ondansetron  (ZOFRAN ) 8 MG tablet Take 1 tablet (8 mg total) by mouth every 8 (eight) hours as needed for nausea or vomiting. Start on third day after chemotherapy. 30 tablet 1   SEMGLEE , YFGN, 100 UNIT/ML Pen Inject 14 Units into the skin at bedtime.     Vitamin D , Ergocalciferol , (DRISDOL ) 1.25 MG (50000 UNIT) CAPS capsule TAKE 1 CAPSULE (50,000 UNITS TOTAL) BY MOUTH EVERY 7 (SEVEN) DAYS. 5 capsule 4   zolpidem  (AMBIEN ) 5 MG tablet Take 1 tablet (5 mg total) by mouth at bedtime. 30 tablet 1   glucose blood (ONETOUCH VERIO) test strip CHECK BLOOD SUGAR DAILY AS NEEDED 100 strip 1   insulin  glargine (LANTUS ) 100 unit/mL SOPN Inject 14 Units into the skin at bedtime. 04/26/23: Reports during TOC 30-day program call: she is currently taking this insulin  14 U at bedtime: reports has not started Semglee  insulin  because she still had Lantus  at home to use- per PCP  advice (Patient not taking: Reported on 05/29/2023)     Insulin  Pen Needle (BD PEN NEEDLE NANO 2ND GEN) 32G X 4 MM MISC USE AS DIRECTED WITH LANTUS  ONCE A DAY. 100 each 3   Lancets (ONETOUCH DELICA PLUS LANCET33G) MISC USE AS DIRECTED TO TEST DAILY 100 each 12   vancomycin  (VANCOCIN ) 125 MG capsule Take 125 mg by mouth 4 (four) times daily. (Patient not taking: Reported on 05/28/2023)       Physical Exam: Blood pressure 124/63, pulse 99, temperature 97.9 F (36.6 C), temperature source Oral, resp. rate 20, height 5\' 4"  (1.626 m), weight 89.6 kg, SpO2 94%. General: Elderly, somewhat chronically ill-appearing white female who is sitting up in her chair and visibly short of breath at her baseline HEENT: head is normocephalic, atraumatic.  Sclera are noninjected.  PERRL.  Ears and nose without any masses or lesions.  Mouth is pink and moist Heart: regular, rate, and rhythm.   Lungs: CTAB, no wheezes, rhonchi, or rales noted.  Respiratory effort is mildly labored but she states is her baseline. Abd: soft, tender in the left lower quadrant and central pelvis but no guarding, rebound, or peritonitis, obese, +BS, no obvious masses, hernias, or organomegaly Psych: A&Ox3 with an appropriate affect.   Results for orders placed or performed during the hospital encounter of 05/29/23 (from the past 48 hours)  Glucose, capillary     Status: Abnormal   Collection Time: 05/29/23  4:17 PM  Result Value Ref Range   Glucose-Capillary 105 (H) 70 - 99 mg/dL    Comment: Glucose reference range applies only to samples taken after fasting for at least 8 hours.  Glucose, capillary     Status: Abnormal   Collection Time: 05/29/23 10:04 PM  Result Value Ref Range   Glucose-Capillary 131 (H) 70 - 99 mg/dL    Comment: Glucose reference range applies only to samples taken after fasting for at least 8 hours.  Glucose, capillary     Status: Abnormal   Collection Time: 05/30/23  8:10 AM  Result Value Ref Range    Glucose-Capillary 110 (  H) 70 - 99 mg/dL    Comment: Glucose reference range applies only to samples taken after fasting for at least 8 hours.  Glucose, capillary     Status: Abnormal   Collection Time: 05/30/23 11:38 AM  Result Value Ref Range   Glucose-Capillary 125 (H) 70 - 99 mg/dL    Comment: Glucose reference range applies only to samples taken after fasting for at least 8 hours.  C Difficile Quick Screen w PCR reflex     Status: Abnormal   Collection Time: 05/30/23 12:45 PM   Specimen: STOOL  Result Value Ref Range   C Diff antigen POSITIVE (A) NEGATIVE   C Diff toxin NEGATIVE NEGATIVE   C Diff interpretation Results are indeterminate. See PCR results.     Comment: Performed at [facility], [address] [place], [place] [address]  C. Diff by PCR, Reflexed     Status: Abnormal   Collection Time: 05/30/23 12:45 PM  Result Value Ref Range   Toxigenic C. Difficile by PCR POSITIVE (A) NEGATIVE    Comment: Positive for toxigenic C. difficile with little to no toxin production. Only treat if clinical presentation suggests symptomatic illness. Performed at [facility], [address] [place], [place] [address]   Glucose, capillary     Status: None   Collection Time: 05/30/23  5:13 PM  Result Value Ref Range   Glucose-Capillary 86 70 - 99 mg/dL    Comment: Glucose reference range applies only to samples taken after fasting for at least 8 hours.  Glucose, capillary     Status: Abnormal   Collection Time: 05/30/23 10:23 PM  Result Value Ref Range   Glucose-Capillary 103 (H) 70 - 99 mg/dL    Comment: Glucose reference range applies only to samples taken after fasting for at least 8 hours.   Comment 1 Notify RN    Comment 2 Document in Chart   CBC with Differential/Platelet     Status: Abnormal   Collection Time: 05/31/23  4:00 AM  Result Value Ref Range   WBC 0.4 (LL) 4.0 - 10.5 K/uL    Comment: REPEATED TO VERIFY THIS CRITICAL RESULT HAS VERIFIED AND  BEEN CALLED TO POTTINGER, M RN BY ABDULHALIM,MALAIKA ON 05 01 2025 AT 0453, AND HAS BEEN READ BACK.     RBC 2.50 (L) 3.87 - 5.11 MIL/uL   Hemoglobin 8.3 (L) 12.0 - 15.0 g/dL   HCT 09.8 (L) 11.9 - 14.7 %   MCV 99.2 80.0 - 100.0 fL   MCH 33.2 26.0 - 34.0 pg   MCHC 33.5 30.0 - 36.0 g/dL   RDW 82.9 (H) 56.2 - 13.0 %   Platelets 15 (LL) 150 - 400 K/uL    Comment: Immature Platelet Fraction may be clinically indicated, consider ordering this additional test [id number] REPEATED TO VERIFY THIS CRITICAL RESULT HAS VERIFIED AND BEEN CALLED TO POTTINGER, M RN BY ABDULHALIM,MALAIKA ON 05 01 2025 AT 0453, AND HAS BEEN READ BACK.     nRBC 0.0 0.0 - 0.2 %   Neutrophils Relative % 7 %   Neutro Abs 0.0 (LL) 1.7 - 7.7 K/uL    Comment: This critical result has verified and been called to [place] Kalisha RN by Zara Heymann on 05 01 2025 at 0453, and has been read back.    Lymphocytes Relative 84 %   Lymphs Abs 0.3 (L) 0.7 - 4.0 K/uL   Monocytes Relative 7 %   Monocytes Absolute 0.0 (L) 0.1 - 1.0  K/uL   Eosinophils Relative 1 %   Eosinophils Absolute 0.0 0.0 - 0.5 K/uL   Basophils Relative 1 %   Basophils Absolute 0.0 0.0 - 0.1 K/uL   WBC Morphology DOHLE BODIES     Comment: TOXIC GRANULATION   Abs Immature Granulocytes 0.00 0.00 - 0.07 K/uL   Tear Drop Cells PRESENT    Burr Cells PRESENT     Comment: Performed at [facility], [address], [place], [place] [address]  Comprehensive metabolic panel     Status: Abnormal   Collection Time: 05/31/23  4:00 AM  Result Value Ref Range   Sodium 133 (L) 135 - 145 mmol/L   Potassium 2.8 (L) 3.5 - 5.1 mmol/L   Chloride 106 98 - 111 mmol/L   CO2 18 (L) 22 - 32 mmol/L   Glucose, Bld 112 (H) 70 - 99 mg/dL    Comment: Glucose reference range applies only to samples taken after fasting for at least 8 hours.   BUN 36 (H) 8 - 23 mg/dL   Creatinine, Ser 1.30 0.44 - 1.00 mg/dL   Calcium  9.0 8.9 - 10.3 mg/dL   Total Protein 5.0 (L) 6.5 -  8.1 g/dL   Albumin 2.2 (L) 3.5 - 5.0 g/dL   AST 27 15 - 41 U/L   ALT 38 0 - 44 U/L   Alkaline Phosphatase 69 38 - 126 U/L   Total Bilirubin 0.9 0.0 - 1.2 mg/dL   GFR, Estimated 57 (L) >60 mL/min    Comment: (NOTE) Calculated using the CKD-EPI Creatinine Equation (2021)    Anion gap 9 5 - 15    Comment: Performed at [facility], [address], [place], [place] [address]  Pathologist smear review     Status: None   Collection Time: 05/31/23  4:00 AM  Result Value Ref Range   Path Review PANCYTOPENIA     Comment: Reviewed By Daymon Evans, M.D. 05/31/2023 Performed at [facility], [address] [place], [place] [address]   Glucose, capillary     Status: Abnormal   Collection Time: 05/31/23  7:21 AM  Result Value Ref Range   Glucose-Capillary 115 (H) 70 - 99 mg/dL    Comment: Glucose reference range applies only to samples taken after fasting for at least 8 hours.  Glucose, capillary     Status: Abnormal   Collection Time: 05/31/23 12:41 PM  Result Value Ref Range   Glucose-Capillary 130 (H) 70 - 99 mg/dL    Comment: Glucose reference range applies only to samples taken after fasting for at least 8 hours.   CT ABDOMEN PELVIS W CONTRAST Result Date: 05/30/2023 CLINICAL DATA:  Abdominal pain, acute, nonlocalized. EXAM: CT ABDOMEN AND PELVIS WITH CONTRAST TECHNIQUE: Multidetector CT imaging of the abdomen and pelvis was performed using the standard protocol following bolus administration of intravenous contrast. RADIATION DOSE REDUCTION: This exam was performed according to the departmental dose-optimization program which includes automated exposure control, adjustment of the mA and/or kV according to patient size and/or use of iterative reconstruction technique. CONTRAST:  OMNIPAQUE  IOHEXOL  300 MG/ML  SOLN COMPARISON:  CT of the abdomen and pelvis 04/08/2023. FINDINGS: Lower chest: Mild dependent atelectasis is present in the right lower lobe.  The lungs are otherwise clear. The heart size is normal. Coronary artery calcifications are present. No significant pleural or pericardial effusion is present. Hepatobiliary: No focal liver abnormality is seen. Status post cholecystectomy. No biliary dilatation. Pancreas: Minimal inflammatory  changes about the head and tail the pancreas are less prominent than on the prior exam. No discrete lesions are present. Spleen: Normal in size without focal abnormality. Adrenals/Urinary Tract: The adrenal glands are normal bilaterally. Mild right-sided hydronephrosis are present. Scarring at the lower pole of the right kidney is stable. The right ureter is dilated to the level of the right UVJ. No obstructing stone or mass lesion is evident. Stranding is noted about the distal ureter. Left nephrectomy is again noted. The urinary bladder is mildly distended. Stomach/Bowel: The stomach and duodenum are within normal limits. Small bowel is unremarkable. Terminal ileum is within normal limits. The appendix is absent. The ascending and transverse colon are within normal limits. The descending colon demonstrates some distal diverticular changes. Diffuse diverticular changes are present the sigmoid colon. Marked wall thickening is present proximally. A fluid collection adjacent to the inflamed sigmoid colon measures 4.4 x 5.6 x 3.5 cm. It abuts the left side of the urinary bladder. No free fluid is present. The distal sigmoid colon and rectum are within normal limits. Vascular/Lymphatic: Atherosclerotic calcifications are present in the aorta and branch vessels. No aneurysm is present. No significant adenopathy is present. Reproductive: Status post hysterectomy. No adnexal masses. Other: No abdominal wall hernia or abnormality. No abdominopelvic ascites. Musculoskeletal: Multilevel degenerative changes are again noted in the lumbar spine. Vacuum disc is present at L1-2, L3-4, L4-5 and L5-S1. Right foraminal narrowing and bilateral  scratched at right foraminal stenosis is present at L4-5. Bilateral foraminal narrowing is present at L5-S1. IMPRESSION: 1. Sigmoid diverticulitis. 2. 4.4 x 5.6 x 3.5 cm fluid collection adjacent to the inflamed sigmoid colon compatible with abscess. 3. Mild right-sided hydronephrosis and hydroureter to the level of the right UVJ. No obstructing stone or mass lesion is evident. This may represent a recently passed stone. 4. Left nephrectomy. 5. Multilevel degenerative changes in the lumbar spine. 6. Coronary artery disease. Electronically Signed   By: Audree Leas M.D.   On: 05/30/2023 16:49      Assessment/Plan Diverticulitis with abscess and recurrent/persistent C. difficile colitis The patient has been seen, examined, labs, chart, vitals, and imaging personally reviewed.  It the patient appears to have had an episode of diverticulitis, uncomplicated about a month and a half ago along with C. difficile.  This was treated and both of these improved as an outpatient.  Unfortunately the patient has had a recurrence over the last several days of both of these infections.  She does have an abscess adjacent to her colon.  We have asked interventional radiology to evaluate this for drainage; however, it is not amendable.  For now, we would recommend continued conservative management with antibiotics and clear liquids.  The patient has significant thrombocytopenia and is actively receiving chemotherapy.  She would be an incredibly high risk operative candidate, nevermind her other comorbidities outlined above in her past medical history.  She is currently not ill-appearing and does not require emergent surgical intervention.  This plan was outlined and discussed with the patient as well as her daughter who was present at bedside.  We did discuss the possibility of repeat imaging in the future if needed.  We will continue to follow with you.   FEN - CLD VTE -on hold due to significant  thrombocytopenia ID -Rocephin /Flagyl /Dificid   Pancytopenia likely secondary to chemotherapy E. coli bacteremia Cirrhosis Small cell lung cancer, currently on chemotherapy with metastatic disease Diabetes mellitus GERD History of renal cell cancer, status post left nephrectomy Hypertension Obesity  Chronic kidney disease Hyperlipidemia  I reviewed Consultant ID, oncology, IR notes, hospitalist notes, last 24 h vitals and pain scores, last 48 h intake and output, last 24 h labs and trends, and last 24 h imaging results.  Leone Ralphs, [facility] Surgery 05/31/2023, 3:01 PM Please see Amion for pager number during day hours 7:00am-4:30pm or 7:00am -11:30am on weekends

## 2023-05-31 NOTE — Progress Notes (Signed)
 Unfortunately, she has C. difficile.  This is certainly not diverticulitis.  This is definitely a lot more serious.  She is still neutropenic.  She has E. coli in her blood.  Thankfully, I would suspect that the E. coli should be sensitive to most antibiotics.  I feel bad that she has the C. difficile.  I cannot imagine that this is from her chemotherapy.  I know she had recently been on antibiotics.  I know I think we had her on low-dose antibiotics as a prophylactic.  This could certainly have been an issue.  Her CBC shows white cell count 0.4.  Hemoglobin 8.3.  Platelet count 15,000.  She is not bleeding.  Will have to watch this closely.  Her sodium 133.  Potassium 2.8.  BUN 36 creatinine 1.0.  Albumin is 2.2.  Bilirubin 0.9.  I am sure that she will get supplemental potassium.  I just feel bad that she has had such a tough time with treatment.  I know treatment has worked.  Her reason CT angiogram of the chest shows that the tumor is shrinking.  I do not think she is eating all that much.  I am not sure she can really eat that much given that she has C. difficile.  There has been no rashes.  She has had no leg swelling.  Again, I do not think there has been any bleeding.  Her vital signs show temperature 97.9.  Pulse 99.  Blood pressure 124/63.  Her head and exam shows no ocular or oral lesions.  She has no palpable cervical or supraclavicular lymph nodes.  Lungs are clear bilaterally.  She has good air movement bilaterally.  Cardiac exam regular rate and rhythm.  She has no murmurs.  Abdomen is soft.  Bowel sounds are present.  There may be a little bit of distention.  There is no obvious fluid wave.  There is no obvious hepato-splenomegaly.  Neurological exam is nonfocal.  I have a feeling that this is going to be a relatively prolonged hospital stay.  Again she is neutropenic.  That with the E. coli and with the C. difficile is certainly a very troublesome combination.  I think we have to  get her white cell count up.  She is on Neupogen .  I do think this is quite tenuous.  I know that she will be followed closely up on 6 E.   Rayleen Cal, MD  Psalm 4:8

## 2023-05-31 NOTE — Progress Notes (Signed)
 Patient ID: Amanda Crawford, female   DOB: 1945-02-26, 78 y.o.   MRN: [id number] Request received for possible abdominal abscess drain placement in patient.  Latest imaging studies were reviewed by Dr. Darylene Epley and the area in question is too small and not in a location amenable to drain placement.

## 2023-05-31 DEATH — deceased

## 2023-06-01 DIAGNOSIS — C342 Malignant neoplasm of middle lobe, bronchus or lung: Secondary | ICD-10-CM | POA: Diagnosis not present

## 2023-06-01 DIAGNOSIS — A4151 Sepsis due to Escherichia coli [E. coli]: Secondary | ICD-10-CM | POA: Diagnosis not present

## 2023-06-01 LAB — CBC WITH DIFFERENTIAL/PLATELET
Abs Immature Granulocytes: 0 10*3/uL (ref 0.00–0.07)
Basophils Absolute: 0 10*3/uL (ref 0.0–0.1)
Basophils Relative: 1 %
Eosinophils Absolute: 0 10*3/uL (ref 0.0–0.5)
Eosinophils Relative: 3 %
HCT: 23.8 % — ABNORMAL LOW (ref 36.0–46.0)
Hemoglobin: 7.6 g/dL — ABNORMAL LOW (ref 12.0–15.0)
Immature Granulocytes: 0 %
Lymphocytes Relative: 66 %
Lymphs Abs: 0.5 10*3/uL — ABNORMAL LOW (ref 0.7–4.0)
MCH: 32.5 pg (ref 26.0–34.0)
MCHC: 31.9 g/dL (ref 30.0–36.0)
MCV: 101.7 fL — ABNORMAL HIGH (ref 80.0–100.0)
Monocytes Absolute: 0.1 10*3/uL (ref 0.1–1.0)
Monocytes Relative: 13 %
Neutro Abs: 0.1 10*3/uL — CL (ref 1.7–7.7)
Neutrophils Relative %: 17 %
Platelets: 7 10*3/uL — CL (ref 150–400)
RBC: 2.34 MIL/uL — ABNORMAL LOW (ref 3.87–5.11)
RDW: 17.2 % — ABNORMAL HIGH (ref 11.5–15.5)
WBC: 0.8 10*3/uL — CL (ref 4.0–10.5)
nRBC: 0 % (ref 0.0–0.2)

## 2023-06-01 LAB — COMPREHENSIVE METABOLIC PANEL WITH GFR
ALT: 35 U/L (ref 0–44)
AST: 26 U/L (ref 15–41)
Albumin: 1.9 g/dL — ABNORMAL LOW (ref 3.5–5.0)
Alkaline Phosphatase: 76 U/L (ref 38–126)
Anion gap: 6 (ref 5–15)
BUN: 34 mg/dL — ABNORMAL HIGH (ref 8–23)
CO2: 17 mmol/L — ABNORMAL LOW (ref 22–32)
Calcium: 9.1 mg/dL (ref 8.9–10.3)
Chloride: 113 mmol/L — ABNORMAL HIGH (ref 98–111)
Creatinine, Ser: 0.9 mg/dL (ref 0.44–1.00)
GFR, Estimated: >60 mL/min (ref >60)
Glucose, Bld: 107 mg/dL — ABNORMAL HIGH (ref 70–99)
Potassium: 3.3 mmol/L — ABNORMAL LOW (ref 3.5–5.1)
Sodium: 136 mmol/L (ref 135–145)
Total Bilirubin: 0.9 mg/dL (ref 0.0–1.2)
Total Protein: 4.3 g/dL — ABNORMAL LOW (ref 6.5–8.1)

## 2023-06-01 LAB — GLUCOSE, CAPILLARY
Glucose-Capillary: 94 mg/dL (ref 70–99)
Glucose-Capillary: 103 mg/dL — ABNORMAL HIGH (ref 70–99)
Glucose-Capillary: 100 mg/dL — ABNORMAL HIGH (ref 70–99)
Glucose-Capillary: 83 mg/dL (ref 70–99)

## 2023-06-01 LAB — PREPARE RBC (CROSSMATCH)

## 2023-06-01 MED ORDER — SIMETHICONE 80 MG PO CHEW
80.0000 mg | CHEWABLE_TABLET | Freq: Four times a day (QID) | ORAL | Status: DC
  Administered 2023-06-01 – 2023-06-10 (×32): 80 mg via ORAL
  Filled 2023-06-01 (×35): qty 1

## 2023-06-01 MED ORDER — POTASSIUM CHLORIDE CRYS ER 20 MEQ PO TBCR
40.0000 meq | EXTENDED_RELEASE_TABLET | Freq: Once | ORAL | Status: AC
  Administered 2023-06-01: 40 meq via ORAL
  Filled 2023-06-01: qty 2

## 2023-06-01 MED ORDER — ACETAMINOPHEN 325 MG PO TABS
650.0000 mg | ORAL_TABLET | Freq: Once | ORAL | Status: AC
  Administered 2023-06-01: 650 mg via ORAL
  Filled 2023-06-01: qty 2

## 2023-06-01 MED ORDER — DICYCLOMINE HCL 10 MG/5ML PO SOLN
10.0000 mg | Freq: Three times a day (TID) | ORAL | Status: DC
  Administered 2023-06-01 – 2023-06-10 (×35): 10 mg via ORAL
  Filled 2023-06-01 (×41): qty 5

## 2023-06-01 MED ORDER — SODIUM CHLORIDE 0.9% IV SOLUTION: Freq: Once | INTRAVENOUS | Status: AC

## 2023-06-01 MED ORDER — DIPHENHYDRAMINE HCL 25 MG PO CAPS
25.0000 mg | ORAL_CAPSULE | Freq: Once | ORAL | Status: AC
  Administered 2023-06-01: 25 mg via ORAL
  Filled 2023-06-01: qty 1

## 2023-06-01 MED ORDER — SODIUM CHLORIDE 0.9% FLUSH: 10.0000 mL | INTRAVENOUS | Status: DC | PRN

## 2023-06-01 MED ORDER — SODIUM CHLORIDE 0.9% FLUSH
10.0000 mL | Freq: Two times a day (BID) | INTRAVENOUS | Status: DC
  Administered 2023-06-01 – 2023-06-16 (×27): 10 mL

## 2023-06-01 MED ORDER — CHLORHEXIDINE GLUCONATE CLOTH 2 % EX PADS
6.0000 | MEDICATED_PAD | Freq: Every day | CUTANEOUS | Status: DC
  Administered 2023-06-01 – 2023-06-17 (×17): 6 via TOPICAL

## 2023-06-01 NOTE — Plan of Care (Signed)

## 2023-06-01 NOTE — Progress Notes (Signed)
 Ms. Korpi does feel a little bit better.  Hopefully, the diarrhea is slowing down a little bit.  She has diverticulitis with an abscess.  There is no role for surgery.  She has C. difficile.  She is being followed by Infectious Disease.  Her white cell count is 800.  Hemoglobin 7.6.  Platelet count 7000.  We will clearly have to give her platelets and I think blood.     E. coli is growing in the blood.  This should be sensitive to the Maxipime .  She silexinor clear liquids.  Hopefully, her diet can be advanced slowly.  Her labs I have back so far shows sodium 136.  Potassium 3.3.  BUN 34 creatinine 0.9.  Calcium  9.1 with albumin of 1.9.  I do not think she is really out of bed yet.  There has been no bleeding.  She says she still has some abdominal spasms.  I will try some Bentyl  for this.  Her temperature is 97.5.  Pulse 98.  Blood pressure 153/67.  Her abdominal exam is slightly distended.  Bowel sounds are active.  She has no guarding.  She has no obvious fluid wave.  There is no palpable liver or spleen tip.  Extremity shows no clubbing, cyanosis or edema.  Neurological exam is nonfocal.   We clearly will have to transfuse her.  She continues on antibiotics.  She has E. coli.  A lot is going on with Ms. Kato.  We have to be patient and let her blood counts come back.  This will be the best way for her to be healed is for her own innate immune system to function.  I do appreciate the great help she is getting from everybody upon 6 E.    Rayleen Cal, MD  [facility] 41:10

## 2023-06-01 NOTE — Plan of Care (Signed)

## 2023-06-01 NOTE — Progress Notes (Signed)
 [facility] Progress Note     Subjective: CC:  Overall she says her pain is slightly better so far today compared to yesterday. Still endorses cramping pain that is slightly worse with PO intake. Having non-bloody diarrhea. Says she prefers getting up to bedside commode compared to stooling in the bed.  Objective: Vital signs in last 24 hours: Temp:  [97.5 F (36.4 C)-97.8 F (36.6 C)] 97.5 F (36.4 C) (05/02 0345) Pulse Rate:  [95-98] 98 (05/02 0345) Resp:  [18] 18 (05/02 0345) BP: (111-153)/(55-67) 153/67 (05/02 0345) SpO2:  [98 %-99 %] 99 % (05/02 0345) Last BM Date : 05/31/23  Intake/Output from previous day: 05/01 0701 - 05/02 0700 In: 2742.8 [P.O.:240; I.V.:1567.8; IV Piggyback:935] Out: -  Intake/Output this shift: No intake/output data recorded.  PE: Gen:  Alert, chronically ill appearing, no acute distress Card:  Regular rate and rhythm, Pulm:  labored respirations on nasal cannula  Abd: Soft, TTP across her lower abdomen, no rebound or guarding. Skin: warm and dry, no rashes  Psych: A&Ox3   Lab Results:  Recent Labs    05/31/23 0400 06/01/23 0618  WBC 0.4* 0.8*  HGB 8.3* 7.6*  HCT 24.8* 23.8*  PLT 15* 7*   BMET Recent Labs    05/31/23 0400 06/01/23 0618  NA 133* 136  K 2.8* 3.3*  CL 106 113*  CO2 18* 17*  GLUCOSE 112* 107*  BUN 36* 34*  CREATININE 1.00 0.90  CALCIUM  9.0 9.1   PT/INR No results for input(s): "LABPROT", "INR" in the last 72 hours. CMP     Component Value Date/Time   NA 136 06/01/2023 0618   NA 139 08/23/2017 0933   NA 140 11/01/2016 0818   K 3.3 (L) 06/01/2023 0618   K 4.4 11/01/2016 0818   CL 113 (H) 06/01/2023 0618   CL 107 04/16/2012 0923   CO2 17 (L) 06/01/2023 0618   CO2 26 11/01/2016 0818   GLUCOSE 107 (H) 06/01/2023 0618   GLUCOSE 141 (H) 11/01/2016 0818   GLUCOSE 115 (H) 04/16/2012 0923   BUN 34 (H) 06/01/2023 0618   BUN 24 08/23/2017 0933   BUN 17.9 11/01/2016 0818   CREATININE 0.90  06/01/2023 0618   CREATININE 1.59 (H) 05/21/2023 1003   CREATININE 1.1 11/01/2016 0818   CALCIUM  9.1 06/01/2023 0618   CALCIUM  10.3 11/01/2016 0818   PROT 4.3 (L) 06/01/2023 0618   PROT 7.8 11/01/2016 0818   ALBUMIN 1.9 (L) 06/01/2023 0618   ALBUMIN 3.8 11/01/2016 0818   AST 26 06/01/2023 0618   AST 48 (H) 05/21/2023 1003   AST 75 (H) 11/01/2016 0818   ALT 35 06/01/2023 0618   ALT 34 05/21/2023 1003   ALT 75 (H) 11/01/2016 0818   ALKPHOS 76 06/01/2023 0618   ALKPHOS 72 11/01/2016 0818   BILITOT 0.9 06/01/2023 0618   BILITOT 0.7 05/21/2023 1003   BILITOT 0.78 11/01/2016 0818   GFRNONAA >60 06/01/2023 0618   GFRNONAA 33 (L) 05/21/2023 1003   GFRAA 44 (L) 06/05/2019 0933   Lipase     Component Value Date/Time   LIPASE 67 (H) 05/29/2023 0303       Studies/Results: CT ABDOMEN PELVIS W CONTRAST Result Date: 05/30/2023 CLINICAL DATA:  Abdominal pain, acute, nonlocalized. EXAM: CT ABDOMEN AND PELVIS WITH CONTRAST TECHNIQUE: Multidetector CT imaging of the abdomen and pelvis was performed using the standard protocol following bolus administration of intravenous contrast. RADIATION DOSE REDUCTION: This exam was performed according to the departmental dose-optimization program  which includes automated exposure control, adjustment of the mA and/or kV according to patient size and/or use of iterative reconstruction technique. CONTRAST:  OMNIPAQUE  IOHEXOL  300 MG/ML  SOLN COMPARISON:  CT of the abdomen and pelvis 04/08/2023. FINDINGS: Lower chest: Mild dependent atelectasis is present in the right lower lobe. The lungs are otherwise clear. The heart size is normal. Coronary artery calcifications are present. No significant pleural or pericardial effusion is present. Hepatobiliary: No focal liver abnormality is seen. Status post cholecystectomy. No biliary dilatation. Pancreas: Minimal inflammatory changes about the head and tail the pancreas are less prominent than on the prior exam. No  discrete lesions are present. Spleen: Normal in size without focal abnormality. Adrenals/Urinary Tract: The adrenal glands are normal bilaterally. Mild right-sided hydronephrosis are present. Scarring at the lower pole of the right kidney is stable. The right ureter is dilated to the level of the right UVJ. No obstructing stone or mass lesion is evident. Stranding is noted about the distal ureter. Left nephrectomy is again noted. The urinary bladder is mildly distended. Stomach/Bowel: The stomach and duodenum are within normal limits. Small bowel is unremarkable. Terminal ileum is within normal limits. The appendix is absent. The ascending and transverse colon are within normal limits. The descending colon demonstrates some distal diverticular changes. Diffuse diverticular changes are present the sigmoid colon. Marked wall thickening is present proximally. A fluid collection adjacent to the inflamed sigmoid colon measures 4.4 x 5.6 x 3.5 cm. It abuts the left side of the urinary bladder. No free fluid is present. The distal sigmoid colon and rectum are within normal limits. Vascular/Lymphatic: Atherosclerotic calcifications are present in the aorta and branch vessels. No aneurysm is present. No significant adenopathy is present. Reproductive: Status post hysterectomy. No adnexal masses. Other: No abdominal wall hernia or abnormality. No abdominopelvic ascites. Musculoskeletal: Multilevel degenerative changes are again noted in the lumbar spine. Vacuum disc is present at L1-2, L3-4, L4-5 and L5-S1. Right foraminal narrowing and bilateral scratched at right foraminal stenosis is present at L4-5. Bilateral foraminal narrowing is present at L5-S1. IMPRESSION: 1. Sigmoid diverticulitis. 2. 4.4 x 5.6 x 3.5 cm fluid collection adjacent to the inflamed sigmoid colon compatible with abscess. 3. Mild right-sided hydronephrosis and hydroureter to the level of the right UVJ. No obstructing stone or mass lesion is evident. This  may represent a recently passed stone. 4. Left nephrectomy. 5. Multilevel degenerative changes in the lumbar spine. 6. Coronary artery disease. Electronically Signed   By: Audree Leas M.D.   On: 05/30/2023 16:49    Anti-infectives: Anti-infectives (From admission, onward)    Start     Dose/Rate Route Frequency Ordered Stop   05/31/23 1600  vancomycin  (VANCOREADY) IVPB 1250 mg/250 mL  Status:  Discontinued        1,250 mg 166.7 mL/hr over 90 Minutes Intravenous Every 48 hours 05/29/23 1437 05/30/23 1416   05/31/23 1445  fidaxomicin  (DIFICID ) tablet 200 mg        200 mg Oral 2 times daily 05/31/23 1353 06/10/23 0959   05/31/23 1345  vancomycin  (VANCOCIN ) capsule 125 mg  Status:  Discontinued        125 mg Oral Daily 05/31/23 1253 05/31/23 1353   05/31/23 1000  fidaxomicin  (DIFICID ) tablet 200 mg  Status:  Discontinued        200 mg Oral 2 times daily 05/31/23 0812 05/31/23 0813   05/31/23 1000  metroNIDAZOLE  (FLAGYL ) IVPB 500 mg        500 mg 100  mL/hr over 60 Minutes Intravenous Every 12 hours 05/31/23 0813     05/29/23 2300  cefTRIAXone  (ROCEPHIN ) 2 g in sodium chloride  0.9 % 100 mL IVPB        2 g 200 mL/hr over 30 Minutes Intravenous Every 24 hours 05/29/23 2211     05/29/23 1700  ceFEPIme  (MAXIPIME ) 2 g in sodium chloride  0.9 % 100 mL IVPB  Status:  Discontinued        2 g 200 mL/hr over 30 Minutes Intravenous Every 12 hours 05/29/23 1437 05/29/23 2211   05/29/23 1600  metroNIDAZOLE  (FLAGYL ) IVPB 500 mg  Status:  Discontinued        500 mg 100 mL/hr over 60 Minutes Intravenous Every 12 hours 05/29/23 1511 05/31/23 0812   05/29/23 1500  vancomycin  (VANCOREADY) IVPB 2000 mg/400 mL        2,000 mg 200 mL/hr over 120 Minutes Intravenous  Once 05/29/23 1437 05/29/23 1724   05/29/23 0430  ceFEPIme  (MAXIPIME ) 2 g in sodium chloride  0.9 % 100 mL IVPB        2 g 200 mL/hr over 30 Minutes Intravenous  Once 05/29/23 0416 05/29/23 0523        Assessment/Plan  Diverticulitis  with abscess and recurrent/persistent C. difficile colitis  - afebrile, leukopenic/pancytopenic  - has a pericolonic abscess that was reviewed by IR and is not amenable to drainage at present. - continue IV abx - she is still having significant abdominal tenderness and cramping, but no peritonitis. Recommend continuation of CLD,  not advancing diet today. I have added mylicon to see if it gives her any added pain relief.  - no emergent surgical needs. We will follow. Should her condition worsen I do not know that she would be an operative candidate given her significant comorbidities listed below. Her perioperative morbidity and mortality risk is high.  FEN - CLD VTE -on hold due to significant thrombocytopenia ID -Rocephin /Flagyl /Dificid     Pancytopenia likely secondary to chemotherapy E. coli bacteremia Cirrhosis Small cell lung cancer, currently on chemotherapy with metastatic disease Diabetes mellitus GERD History of renal cell cancer, status post left nephrectomy Hypertension Obesity Chronic kidney disease Hyperlipidemia  LOS: 3 days   I reviewed nursing notes, Consultant ID, oncology notes, hospitalist notes, last 24 h vitals and pain scores, last 48 h intake and output, last 24 h labs and trends, and last 24 h imaging results.  This care required moderate level of medical decision making.   Michial Akin, PA-C [facility] Please see Amion for pager number during day hours 7:00am-4:30pm

## 2023-06-01 NOTE — Progress Notes (Signed)
 PT Cancellation Note  Patient Details Name: Amanda Crawford MRN: [id number] DOB: 24-Dec-1945   Cancelled Treatment:     Pt lethargic and resting in bed unable to tolerate PT session due to ongoing diarrhea with multiple trips to [facility]. Nursing in room to administer Platelets, blood, and Flexi-seal. Will re-attempt next available date/time per POC and pt tolerance.   Diona Franklin 06/01/2023, 4:13 PM

## 2023-06-01 NOTE — Progress Notes (Signed)
 PROGRESS NOTE  [name]  DOB: 08-30-1945  PCP: Neda Balk, MD EAV:[id number]  DOA: 05/29/2023  LOS: 3 days  Hospital Day: 4  Brief narrative: Amanda Crawford is a 78 y.o. female with PMH significant for small cell lung cancer, prior renal cell cancer with lung mets, DM2, CKD, recently hospitalized in March for septic shock diverticulitis and C. difficile infection, discharged on oral Augmentin  and oral vancomycin . With the completion of antibiotics, patient's symptoms improved but few days ago, started again 4/29, patient presented to the ED with complaint of persistent watery diarrhea weakness, poor oral intake, confusion.  Initial labs with WBC count significantly low at 0.4, hemoglobin 10.9, platelet 48, sodium 130, BUN/creatinine 46/1.35, troponin elevated to 170 Chest x-ray, CT head unremarkable for acute pathology CT angio chest did not show any evidence of pulm embolism, showed right lower lobe mass, liver cirrhosis. Started on broad-spectrum antibiotics for neutropenia  Admitted to [facility] Blood culture sent on admission is growing E. coli, pending sensitivity 4/30, C. difficile PCR positive 4/30, CT abdomen and pelvis showed sigmoid diverticulitis, 3.5 X 4.4 X 5.6 cm abscess.  Subjective: Patient was seen and examined this morning. Sitting up on bedside commode.  Still continues to have significant diarrhea.  She is afraid to soil her bed and is tired of using commode multiple times.  Agrees to Flexi-Seal. Family not at bedside. Labs from this morning reviewed.  Noted downtrending hemoglobin, low platelets and a plan to transfuse  Assessment and plan: Sepsis POA Sigmoid diverticulitis and abscess E. coli bacteremia Immunocompromise status ID, general surgery and IR were consulted  Per IR, abscess is not amenable to percutaneous drainage. Per ID recommendation, patient is currently on IV Rocephin , and IV Flagyl . Neutropenic precautions Recent Labs  Lab  05/29/23 0303 05/29/23 0316 05/31/23 0400 06/01/23 0618  WBC 0.4*  --  0.4* 0.8*  LATICACIDVEN  --  1.7  --   --    Acute diarrhea  C. difficile colitis Patient had recently completed treatment for C. difficile in March with oral vancomycin  Presented with recurrence of diarrhea, poor oral intake 4/30, C. difficile PCR and antigen positive. Currently on Dificid .  Continues to have loose bowel movement. She is afraid to soil her bed and is tired of using commode multiple times.  Agrees to Flexi-Seal. There is also concern that she could be having diarrhea due to autoimmune colitis from cancer treatment.  Specially because C. difficile toxin is negative.  Since he is dealing with severe diarrhea, I will add Benadryl  daily  Acute on chronic anemia  Patient has chronic macrocytic anemia related to cancer  Hemoglobin downtrending, 7.6 today.  1 unit of +PRBC transfusion ordered this morning. Continue iron supplement Was also on aspirin .  I would hold it given chronic anemia Recent Labs    09/07/22 1110 10/12/22 1036 02/08/23 1110 03/14/23 1116 04/30/23 0910 05/21/23 1003 05/29/23 0303 05/31/23 0400 06/01/23 0618  HGB  --    < > 13.0   < > 9.9* 8.8* 10.3* 8.3* 7.6*  MCV  --    < > 101.7*   < > 104.5* 105.7* 97.4 99.2 101.7*  VITAMINB12 612  --  784  --   --   --   --   --   --    < > = values in this interval not displayed.    Thrombocytopenia Platelet level significantly low at 7 today.  No active bleeding. 1 unit of platelet transfusion ordered this morning.  Continue SCDs for DVT prophylaxis.   Recent Labs  Lab 05/29/23 0303 05/31/23 0400 06/01/23 0618  PLT 48* 15* 7*    Extensive stage small cell lung cancer on chemotherapy Under the care of Dr. Maria Shiner Per oncology note, she had 3 cycles of chemotherapy, last on 05/21/2023.    Neutropenia Due to chemotherapy.  Neupogen  added by oncology Neutropenic precautions  Type 2 diabetes mellitus A1c 7.6 on 02/08/2023 PTA  meds-Semglee  nightly.  No longer on glimepiride  because of hypoglycemia episodes recently Currently not on scheduled insulin , continue SSI/Accu-Cheks Recent Labs  Lab 05/31/23 1241 05/31/23 1745 05/31/23 2200 05/31/23 2236 06/01/23 0708  GLUCAP 130* 97 106* 103* 94   CKD 3B Acute metabolic acidosis Renal function at baseline.  Continue to monitor Serum bicarb level was low at 19 on admission, likely due to diarrhea.  Continues to remain low.  Continue to monitor  Recent Labs    04/09/23 0335 04/10/23 0233 04/11/23 0242 04/12/23 0333 04/16/23 1321 04/30/23 0910 05/21/23 1003 05/29/23 0303 05/31/23 0400 06/01/23 0618  BUN 37* 28* 23 20 15 22  28* 46* 36* 34*  CREATININE 1.40* 1.45* 1.27* 1.33* 1.24* 1.53* 1.59* 1.35* 1.00 0.90  CO2 14* 18* 17* 18* 26 26 25  19* 18* 17*   Hypertension PTA meds- Coreg  half tab of 3.125 mg twice daily Coreg  currently on hold due to low blood pressure, poor oral intake and diarrhea.  Liver cirrhosis new finding on CT scan during her last hospital stay, will need outpatient GI follow-up for this   Hyperlipidemia statin on hold due to liver cirrhosis.   Mobility: Impaired mobility due to chemo.  Would benefit from PT eval  Goals of care   Code Status: Full Code     DVT prophylaxis:  Place and maintain sequential compression device Start: 05/31/23 0835   Antimicrobials: IV Rocephin , IV Flagyl , oral Dificid  Fluid: NS at 75 mL/h to continue due to fluid loss Consultants: Oncology, ID Family Communication: Family not at at bedside  Status: Inpatient Level of care:  Telemetry Medical   Patient is from: Home Needs to continue in-hospital care: Ongoing workup for diarrhea, diverticulitis, abscess Anticipated d/c to: Pending clinical course, pending PT eval   Diet:  Diet Order             Diet clear liquid Fluid consistency: Thin  Diet effective now                   Scheduled Meds:  sodium chloride    Intravenous Once    Chlorhexidine  Gluconate Cloth  6 each Topical Daily   dicyclomine   10 mg Oral TID AC & HS   fidaxomicin   200 mg Oral BID   filgrastim  (NIVESTYM ) SQ  480 mcg Subcutaneous Daily   Gerhardt's butt cream   Topical TID   insulin  aspart  0-15 Units Subcutaneous TID WC   insulin  aspart  0-5 Units Subcutaneous QHS   insulin  glargine-yfgn  14 Units Subcutaneous QHS   simethicone   80 mg Oral QID   sodium chloride  flush  10-40 mL Intracatheter Q12H    PRN meds: acetaminophen  **OR** [DISCONTINUED] acetaminophen , albuterol , dicyclomine , morphine  injection, ondansetron  **OR** ondansetron  (ZOFRAN ) IV, sodium chloride  flush   Infusions:   sodium chloride  75 mL/hr at 06/01/23 0533   cefTRIAXone  (ROCEPHIN )  IV Stopped (05/31/23 2333)   metronidazole  500 mg (06/01/23 0953)   potassium chloride       Antimicrobials: Anti-infectives (From admission, onward)    Start     Dose/Rate Route  Frequency Ordered Stop   05/31/23 1600  vancomycin  (VANCOREADY) IVPB 1250 mg/250 mL  Status:  Discontinued        1,250 mg 166.7 mL/hr over 90 Minutes Intravenous Every 48 hours 05/29/23 1437 05/30/23 1416   05/31/23 1445  fidaxomicin  (DIFICID ) tablet 200 mg        200 mg Oral 2 times daily 05/31/23 1353 06/10/23 0959   05/31/23 1345  vancomycin  (VANCOCIN ) capsule 125 mg  Status:  Discontinued        125 mg Oral Daily 05/31/23 1253 05/31/23 1353   05/31/23 1000  fidaxomicin  (DIFICID ) tablet 200 mg  Status:  Discontinued        200 mg Oral 2 times daily 05/31/23 0812 05/31/23 0813   05/31/23 1000  metroNIDAZOLE  (FLAGYL ) IVPB 500 mg        500 mg 100 mL/hr over 60 Minutes Intravenous Every 12 hours 05/31/23 0813     05/29/23 2300  cefTRIAXone  (ROCEPHIN ) 2 g in sodium chloride  0.9 % 100 mL IVPB        2 g 200 mL/hr over 30 Minutes Intravenous Every 24 hours 05/29/23 2211     05/29/23 1700  ceFEPIme  (MAXIPIME ) 2 g in sodium chloride  0.9 % 100 mL IVPB  Status:  Discontinued        2 g 200 mL/hr over 30 Minutes  Intravenous Every 12 hours 05/29/23 1437 05/29/23 2211   05/29/23 1600  metroNIDAZOLE  (FLAGYL ) IVPB 500 mg  Status:  Discontinued        500 mg 100 mL/hr over 60 Minutes Intravenous Every 12 hours 05/29/23 1511 05/31/23 0812   05/29/23 1500  vancomycin  (VANCOREADY) IVPB 2000 mg/400 mL        2,000 mg 200 mL/hr over 120 Minutes Intravenous  Once 05/29/23 1437 05/29/23 1724   05/29/23 0430  ceFEPIme  (MAXIPIME ) 2 g in sodium chloride  0.9 % 100 mL IVPB        2 g 200 mL/hr over 30 Minutes Intravenous  Once 05/29/23 0416 05/29/23 0523       Objective: Vitals:   05/31/23 2016 06/01/23 0345  BP: (!) 111/55 (!) 153/67  Pulse: 95 98  Resp: 18 18  Temp: 97.8 F (36.6 C) (!) 97.5 F (36.4 C)  SpO2: 98% 99%    Intake/Output Summary (Last 24 hours) at 06/01/2023 1043 Last data filed at 06/01/2023 0300 Gross per 24 hour  Intake 2742.76 ml  Output --  Net 2742.76 ml   Filed Weights   05/29/23 0304 05/29/23 1416  Weight: 93.4 kg 89.6 kg   Weight change:  Body mass index is 33.9 kg/m.   Physical Exam: General exam: Very pleasant, elderly Caucasian female.  Not in pain Skin: No rashes, lesions or ulcers. HEENT: Atraumatic, normocephalic, no obvious bleeding Lungs: Clear to auscultation bilaterally CVS: S1, S2, no murmur,   GI/Abd: Soft, mild tenderness present in lower abdomen, nondistended, bowel sound present,   CNS: Alert, awake, oriented x 3 Psychiatry: Mood appropriate,  Extremities: No pedal edema, no calf tenderness  Data Review: I have personally reviewed the laboratory data and studies available.  F/u labs ordered Unresulted Labs (From admission, onward)     Start     Ordered   05/31/23 0500  CBC with Differential/Platelet  Daily,   R      05/30/23 0653   05/31/23 0500  Comprehensive metabolic panel  Daily,   R      05/30/23 1914  Signed, Hoyt Macleod, MD [facility] 06/01/2023

## 2023-06-01 NOTE — TOC Initial Note (Signed)
 Transition of Care [facility]) - Initial/Assessment Note    Patient Details  Name: Amanda Crawford MRN: [id number] Date of Birth: 12/18/1945  Transition of Care [facility]) CM/SW Contact:    Loreda Rodriguez, RN Phone Number:[phone]  06/01/2023, 3:54 PM  Clinical Narrative:                 TOC following patient with high risk for readmission. Patient is from home with spouse where she normally functions independently. Patient states that she  does have PCP Rodrick Clapper, Bonita Bussing, MD ) and follows on a regular basis. Patient confirms that she has access to affordable medications. No HH services or DME noted. TOC will continue to follow.   Expected Discharge Plan: Home w Home Health Services Barriers to Discharge: Continued Medical Work up   Patient Goals and CMS Choice Patient states their goals for this hospitalization and ongoing recovery are:: Wants to get better to go home     [place] ownership interest in [facility].provided to::  (n/a)    Expected Discharge Plan and Services In-house Referral: NA Discharge Planning Services: CM Consult Post Acute Care Choice: NA Living arrangements for the past 2 months: Single Family Home                 DME Arranged: N/A DME Agency: NA                  Prior Living Arrangements/Services Living arrangements for the past 2 months: Single Family Home Lives with:: Spouse Patient language and need for interpreter reviewed:: Yes Do you feel safe going back to the place where you live?: Yes      Need for Family Participation in Patient Care: Yes (Comment) Care giver support system in place?: Yes (comment) Current home services:  (n/a) Criminal Activity/Legal Involvement Pertinent to Current Situation/Hospitalization: No - Comment as needed  Activities of Daily Living   ADL Screening (condition at time of admission) Independently performs ADLs?: No Does the patient have a NEW difficulty with bathing/dressing/toileting/self-feeding  that is expected to last >3 days?: Yes (Initiates electronic notice to provider for possible OT consult) Does the patient have a NEW difficulty with getting in/out of bed, walking, or climbing stairs that is expected to last >3 days?: Yes (Initiates electronic notice to provider for possible PT consult) Does the patient have a NEW difficulty with communication that is expected to last >3 days?: No Is the patient deaf or have difficulty hearing?: No Does the patient have difficulty seeing, even when wearing glasses/contacts?: No Does the patient have difficulty concentrating, remembering, or making decisions?: No  Permission Sought/Granted Permission sought to share information with : Family Supports Permission granted to share information with : Yes, Verbal Permission Granted  Share Information with NAME: Amanda Crawford     Permission granted to share info w Relationship: spouse  Permission granted to share info w Contact Information: [phone]  Emotional Assessment Appearance:: Appears stated age Attitude/Demeanor/Rapport: Gracious Affect (typically observed): Pleasant, Quiet Orientation: : Oriented to Self, Oriented to Place, Oriented to  Time, Oriented to Situation (slight confusion documented but not currently confused) Alcohol / Substance Use: Not Applicable Psych Involvement: No (comment)  Admission diagnosis:  Elevated troponin [R79.89] Sepsis (HCC) [A41.9] Neutropenia, unspecified type [facility]) [D70.9] Patient Active Problem List   Diagnosis Date Noted   Enteritis due to Clostridium difficile 05/31/2023   Neutropenia (HCC) 05/30/2023   Sepsis (HCC) 05/29/2023   Hypomagnesemia 04/18/2023   Skin ulcer of sacrum (HCC)  04/18/2023   Lung cancer (HCC) 04/18/2023   Leukocytosis 04/15/2023   Shock (HCC) 04/08/2023   Small cell carcinoma of middle lobe of right lung (HCC) 03/14/2023   Pulmonary nodule 02/20/2023   Mediastinal adenopathy 02/20/2023   Right foot pain 10/12/2022    Hypercalcemia 04/12/2022   Incontinence of feces    Liver disease 05/12/2021   Right hip pain 03/03/2021   Elevated lipase 03/03/2021   Acute pancreatitis 12/15/2020   Anemia 12/15/2020   Primary hypertension 12/15/2020   Hyperkalemia 10/14/2020   Urinary incontinence 10/14/2020   Frequent urination 09/09/2020   Chronic midline low back pain without sciatica 09/09/2020   Diverticulitis 01/06/2019   Renal insufficiency 09/22/2018   Right renal mass 08/21/2018   Malignant tumor of renal pelvis, right (HCC) 08/21/2018   Coronary artery disease involving native coronary artery of native heart without angina pectoris 07/30/2017   Chronic systolic heart failure (HCC) 07/30/2017   Skin tags, multiple acquired 02/08/2017   Nonischemic cardiomyopathy (HCC) 07/24/2016   LBBB (left bundle branch block) 07/24/2016   Diarrhea 06/29/2016   DM (diabetes mellitus), type 2 (HCC) 06/29/2016   Bilateral thoracic back pain 09/12/2015   Preventative health care 09/12/2015   Allergy 03/12/2015   Dermatitis 03/12/2015   Skin lesion of breast 03/12/2015   Gout 03/12/2015   Morbid obesity (HCC) 01/20/2015   Migraine 11/23/2014   DJD (degenerative joint disease) 11/23/2014   Benign paroxysmal positional vertigo 11/23/2014   H/O measles    Hx of colonic polyp 11/19/2014   Hx of cancer of lung 11/19/2014   GERD (gastroesophageal reflux disease) 11/19/2014   Hyperlipidemia 11/19/2014   Osteopenia 11/19/2014   Vitamin D  deficiency 11/19/2014   COUGH 09/16/2007   Cancer of kidney (HCC) 08/14/2007   WEIGHT GAIN, ABNORMAL 08/14/2007   Dyspnea 08/14/2007   PCP:  Neda Balk, MD Pharmacy:   [facility] [phone] - [place], [place] - [address] [place] [address] Phone: [phone] Fax: [phone]  [facility] - [place], [place] - [address] [place] [address] Phone: [phone] Fax: [phone]     Social  Drivers of Health (SDOH) Social History: SDOH Screenings   Food Insecurity: No Food Insecurity (05/29/2023)  Housing: Low Risk  (05/29/2023)  Transportation Needs: No Transportation Needs (05/29/2023)  Utilities: Not At Risk (05/29/2023)  Alcohol Screen: Low Risk  (07/05/2020)  Depression (PHQ2-9): Low Risk  (12/04/2022)  Financial Resource Strain: Low Risk  (07/05/2020)  Physical Activity: Inactive (07/05/2020)  Social Connections: Socially Integrated (05/29/2023)  Stress: No Stress Concern Present (08/12/2022)   Received from [facility]  Tobacco Use: Medium Risk (05/29/2023)   SDOH Interventions:     Readmission Risk Interventions    06/01/2023    3:49 PM 04/09/2023   12:01 PM  Readmission Risk Prevention Plan  Transportation Screening Complete Complete  PCP or Specialist Appt within 5-7 Days Complete   Home Care Screening Complete   Medication Review (RN CM) Complete   HRI or Home Care Consult  Complete  Social Work Consult for Recovery Care Planning/Counseling  Complete  Palliative Care Screening  Not Applicable  Medication Review [employer])  Referral to Pharmacy

## 2023-06-02 DIAGNOSIS — D709 Neutropenia, unspecified: Secondary | ICD-10-CM | POA: Diagnosis not present

## 2023-06-02 DIAGNOSIS — A4151 Sepsis due to Escherichia coli [E. coli]: Secondary | ICD-10-CM | POA: Diagnosis not present

## 2023-06-02 LAB — COMPREHENSIVE METABOLIC PANEL WITH GFR
ALT: 34 U/L (ref 0–44)
AST: 24 U/L (ref 15–41)
Albumin: 2.3 g/dL — ABNORMAL LOW (ref 3.5–5.0)
Alkaline Phosphatase: 81 U/L (ref 38–126)
Anion gap: 10 (ref 5–15)
BUN: 27 mg/dL — ABNORMAL HIGH (ref 8–23)
CO2: 14 mmol/L — ABNORMAL LOW (ref 22–32)
Calcium: 9.6 mg/dL (ref 8.9–10.3)
Chloride: 115 mmol/L — ABNORMAL HIGH (ref 98–111)
Creatinine, Ser: 0.87 mg/dL (ref 0.44–1.00)
GFR, Estimated: >60 mL/min (ref >60)
Glucose, Bld: 133 mg/dL — ABNORMAL HIGH (ref 70–99)
Potassium: 3.4 mmol/L — ABNORMAL LOW (ref 3.5–5.1)
Sodium: 139 mmol/L (ref 135–145)
Total Bilirubin: 0.6 mg/dL (ref 0.0–1.2)
Total Protein: 4.7 g/dL — ABNORMAL LOW (ref 6.5–8.1)

## 2023-06-02 LAB — CBC WITH DIFFERENTIAL/PLATELET
Abs Immature Granulocytes: 0.03 10*3/uL (ref 0.00–0.07)
Basophils Absolute: 0 10*3/uL (ref 0.0–0.1)
Basophils Relative: 1 %
Eosinophils Absolute: 0 10*3/uL (ref 0.0–0.5)
Eosinophils Relative: 1 %
HCT: 27.6 % — ABNORMAL LOW (ref 36.0–46.0)
Hemoglobin: 8.8 g/dL — ABNORMAL LOW (ref 12.0–15.0)
Immature Granulocytes: 1 %
Lymphocytes Relative: 39 %
Lymphs Abs: 0.8 10*3/uL (ref 0.7–4.0)
MCH: 32.6 pg (ref 26.0–34.0)
MCHC: 31.9 g/dL (ref 30.0–36.0)
MCV: 102.2 fL — ABNORMAL HIGH (ref 80.0–100.0)
Monocytes Absolute: 0.3 10*3/uL (ref 0.1–1.0)
Monocytes Relative: 12 %
Neutro Abs: 1 10*3/uL — ABNORMAL LOW (ref 1.7–7.7)
Neutrophils Relative %: 46 %
Platelets: 9 10*3/uL — CL (ref 150–400)
RBC: 2.7 MIL/uL — ABNORMAL LOW (ref 3.87–5.11)
RDW: 17.6 % — ABNORMAL HIGH (ref 11.5–15.5)
WBC: 2.1 10*3/uL — ABNORMAL LOW (ref 4.0–10.5)
nRBC: 0 % (ref 0.0–0.2)

## 2023-06-02 LAB — GLUCOSE, CAPILLARY
Glucose-Capillary: 109 mg/dL — ABNORMAL HIGH (ref 70–99)
Glucose-Capillary: 123 mg/dL — ABNORMAL HIGH (ref 70–99)
Glucose-Capillary: 127 mg/dL — ABNORMAL HIGH (ref 70–99)
Glucose-Capillary: 84 mg/dL (ref 70–99)
Glucose-Capillary: 90 mg/dL (ref 70–99)

## 2023-06-02 MED ORDER — STERILE WATER FOR INJECTION IV SOLN
INTRAVENOUS | Status: AC
  Filled 2023-06-02: qty 150
  Filled 2023-06-02: qty 1000

## 2023-06-02 MED ORDER — SODIUM CHLORIDE 0.9% IV SOLUTION: Freq: Once | INTRAVENOUS | Status: AC

## 2023-06-02 MED ORDER — POTASSIUM CHLORIDE CRYS ER 20 MEQ PO TBCR
40.0000 meq | EXTENDED_RELEASE_TABLET | Freq: Once | ORAL | Status: AC
Start: 2023-06-02 — End: 2023-06-02
  Administered 2023-06-02: 40 meq via ORAL
  Filled 2023-06-02: qty 2

## 2023-06-02 MED ORDER — SODIUM CHLORIDE 0.45 % IV SOLN: INTRAVENOUS | Status: DC

## 2023-06-02 NOTE — Progress Notes (Signed)
 Amanda Crawford is still having problems with diarrhea.  This is the C. difficile.  She also has problems with thrombocytopenia.  We will give her platelets today.  At least, her white cell count started to come up.  Her white cell count is 2.1.  Her ANC is 1.0.  Hemoglobin 8.8.  I will continue her on the Neupogen  for right now.  Her sodium 139.  Potassium 3.4.  BUN 27 creatinine 0.87.  Calcium  9.6 with an albumin of 2.3.  She has had no bleeding.  There is been no fever.  She continues on IV antibiotics.  I think she has a rectal tube in now.  She is on treatment for the C. difficile.  Her vital signs are stable.  Temperature 97.5.  Pulse 104.  Blood pressure 122/68.  Her head and neck exam shows no ocular or oral lesions.  There are no palpable cervical or supraclavicular lymph nodes.  Lungs are clear bilaterally.  Cardiac exam is tachycardic but regular.  Extremity shows no clubbing, cyanosis or edema.  Abdominal exam is soft.  She is somewhat obese.  She has decent bowel sounds.  There is no guarding or rebound tenderness.  Neurological exam is nonfocal.  Again, Amanda Crawford had neutropenia.  This was from the chemotherapy.  She had been getting Neulasta .  She now has a E. coli bacteremia.  She has C. difficile colitis.  At least, her white cell count started to come up now.  This should help her therapy with respect to the bacteremia and to some degree of the C. difficile.  She will need to be transfused with platelets today.  I talked her about this today.  She is in agreement.  I will think that her white cell count will continue to come up.  Hopefully, her platelet count will also start to come up.  I think her hospitalization will be dictated by her C. difficile and the diarrhea that she has.  I appreciate all the great care she is given everybody up on 6 E.   Rayleen Cal, MD  [facility] 41:10

## 2023-06-02 NOTE — Progress Notes (Signed)
 PROGRESS NOTE  Amanda Crawford  DOB: [birth year]  PCP: Neda Balk, MD YQM:[id number]  DOA: 05/29/2023  LOS: 4 days  Hospital Day: 5  Brief narrative: Amanda Crawford is a 78 y.o. female with PMH significant for small cell lung cancer, prior renal cell cancer with lung mets, DM2, CKD, recently hospitalized in March for septic shock diverticulitis and C. difficile infection, discharged on oral Augmentin  and oral vancomycin . With the completion of antibiotics, patient's symptoms improved but few days ago, started again 4/29, patient presented to the ED with complaint of persistent watery diarrhea weakness, poor oral intake, confusion.  Initial labs with WBC count significantly low at 0.4, hemoglobin 10.9, platelet 48, sodium 130, BUN/creatinine 46/1.35, troponin elevated to 170 Chest x-ray, CT head unremarkable for acute pathology CT angio chest did not show any evidence of pulm embolism, showed right lower lobe mass, liver cirrhosis. Started on broad-spectrum antibiotics for neutropenia  Admitted to [facility] Blood culture sent on admission is growing E. coli, pending sensitivity 4/30, C. difficile PCR positive 4/30, CT abdomen and pelvis showed sigmoid diverticulitis, 3.5 X 4.4 X 5.6 cm abscess.  Subjective: Patient was seen and examined this morning. Propped up in bed.  Not in distress.  Has Flexi-Seal draining liquid stool. Abdominal tenderness improving. No family at bedside. WBC coming up but platelet still down.  Assessment and plan: Sepsis POA Sigmoid diverticulitis and abscess E. coli bacteremia Immunocompromise status ID, general surgery and IR were consulted  Per IR, abscess is not amenable to percutaneous drainage. Per ID recommendation, patient is currently on IV Rocephin , and IV Flagyl . Neutropenic precautions to continue Recent Labs  Lab 05/29/23 0303 05/29/23 0316 05/31/23 0400 06/01/23 0618 06/02/23 0524  WBC 0.4*  --  0.4* 0.8* 2.1*  LATICACIDVEN  --  1.7   --   --   --    Acute diarrhea  C. difficile colitis Patient had recently completed treatment for C. difficile in March with oral vancomycin  Presented with recurrence of diarrhea, poor oral intake 4/30, C. difficile PCR and antigen positive. Currently on Dificid .  Continues to have loose bowel movement. Flexi-Seal in place with liquid stool There is also concern that she could be having diarrhea due to autoimmune colitis from cancer treatment.  Specially because C. difficile toxin is negative.  Acute on chronic anemia  Patient has chronic macrocytic anemia related to cancer  Hemoglobin improved after 1 unit PRBC given yesterday. Continue iron supplement Was also on aspirin , currently on hold Recent Labs    09/07/22 1110 10/12/22 1036 02/08/23 1110 03/14/23 1116 05/21/23 1003 05/29/23 0303 05/31/23 0400 06/01/23 0618 06/02/23 0524  HGB  --    < > 13.0   < > 8.8* 10.3* 8.3* 7.6* 8.8*  MCV  --    < > 101.7*   < > 105.7* 97.4 99.2 101.7* 102.2*  VITAMINB12 612  --  784  --   --   --   --   --   --    < > = values in this interval not displayed.    Severe thrombocytopenia Platelet level significantly low, thankfully no bleeding  1 more unit of PRBC transfusion ordered for this morning.  Avoid heparin  products. Continue SCDs for DVT prophylaxis.   Recent Labs  Lab 05/29/23 0303 05/31/23 0400 06/01/23 0618 06/02/23 0524  PLT 48* 15* 7* 9*    Extensive stage small cell lung cancer on chemotherapy Under the care of Dr. Maria Shiner Per oncology note, she had 3  cycles of chemotherapy, last on 05/21/2023.    Neutropenia Due to chemotherapy.  Neupogen  added by oncology.  WBC count rising up gradually. Neutropenic precautions.    Type 2 diabetes mellitus A1c 7.6 on 02/08/2023 PTA meds-Semglee  nightly.  No longer on glimepiride  because of hypoglycemia episodes recently Currently not on scheduled insulin , continue SSI/Accu-Cheks Recent Labs  Lab 06/01/23 1113 06/01/23 1700  06/01/23 2155 06/02/23 0047 06/02/23 0801  GLUCAP 103* 100* 83 109* 127*   CKD 3B Acute metabolic acidosis Renal function at baseline.  Continue to monitor BUN improving.  Serum bicarb level low. Switch from NaCl to sodium bicarb drip.  Recent Labs    04/10/23 0233 04/11/23 0242 04/12/23 0333 04/16/23 1321 04/30/23 0910 05/21/23 1003 05/29/23 0303 05/31/23 0400 06/01/23 0618 06/02/23 0524  BUN 28* 23 20 15 22  28* 46* 36* 34* 27*  CREATININE 1.45* 1.27* 1.33* 1.24* 1.53* 1.59* 1.35* 1.00 0.90 0.87  CO2 18* 17* 18* 26 26 25  19* 18* 17* 14*   Hypertension PTA meds- Coreg  half tab of 3.125 mg twice daily Coreg  currently on hold due to low blood pressure, poor oral intake and diarrhea.  Liver cirrhosis new finding on CT scan during her last hospital stay, will need outpatient GI follow-up for this   Hyperlipidemia statin on hold due to liver cirrhosis.   Mobility: Impaired mobility due to chemo.  Continues to need PT  Goals of care   Code Status: Full Code     DVT prophylaxis:  Place and maintain sequential compression device Start: 05/31/23 0835   Antimicrobials: IV Rocephin , IV Flagyl , oral Dificid  Fluid: Sodium bicarb drip at 75 mL/h to continue due to fluid loss Consultants: Oncology, ID Family Communication: Family not at at bedside  Status: Inpatient Level of care:  Telemetry Medical   Patient is from: Home Needs to continue in-hospital care: Ongoing workup for diarrhea, diverticulitis, abscess Anticipated d/c to: Pending clinical course   Diet:  Diet Order             Diet clear liquid Fluid consistency: Thin  Diet effective now                   Scheduled Meds:  sodium chloride    Intravenous Once   Chlorhexidine  Gluconate Cloth  6 each Topical Daily   dicyclomine   10 mg Oral TID AC & HS   fidaxomicin   200 mg Oral BID   filgrastim  (NIVESTYM ) SQ  480 mcg Subcutaneous Daily   Gerhardt's butt cream   Topical TID   insulin  aspart  0-15  Units Subcutaneous TID WC   insulin  aspart  0-5 Units Subcutaneous QHS   insulin  glargine-yfgn  14 Units Subcutaneous QHS   simethicone   80 mg Oral QID   sodium chloride  flush  10-40 mL Intracatheter Q12H    PRN meds: acetaminophen  **OR** [DISCONTINUED] acetaminophen , albuterol , dicyclomine , morphine  injection, ondansetron  **OR** ondansetron  (ZOFRAN ) IV, sodium chloride  flush   Infusions:   cefTRIAXone  (ROCEPHIN )  IV 2 g (06/02/23 0011)   metronidazole  500 mg (06/02/23 0850)   sodium bicarbonate 150 mEq in sterile water 1,150 mL infusion      Antimicrobials: Anti-infectives (From admission, onward)    Start     Dose/Rate Route Frequency Ordered Stop   05/31/23 1600  vancomycin  (VANCOREADY) IVPB 1250 mg/250 mL  Status:  Discontinued        1,250 mg 166.7 mL/hr over 90 Minutes Intravenous Every 48 hours 05/29/23 1437 05/30/23 1416   05/31/23 1445  fidaxomicin  (DIFICID )  tablet 200 mg        200 mg Oral 2 times daily 05/31/23 1353 06/10/23 0959   05/31/23 1345  vancomycin  (VANCOCIN ) capsule 125 mg  Status:  Discontinued        125 mg Oral Daily 05/31/23 1253 05/31/23 1353   05/31/23 1000  fidaxomicin  (DIFICID ) tablet 200 mg  Status:  Discontinued        200 mg Oral 2 times daily 05/31/23 0812 05/31/23 0813   05/31/23 1000  metroNIDAZOLE  (FLAGYL ) IVPB 500 mg        500 mg 100 mL/hr over 60 Minutes Intravenous Every 12 hours 05/31/23 0813     05/29/23 2300  cefTRIAXone  (ROCEPHIN ) 2 g in sodium chloride  0.9 % 100 mL IVPB        2 g 200 mL/hr over 30 Minutes Intravenous Every 24 hours 05/29/23 2211     05/29/23 1700  ceFEPIme  (MAXIPIME ) 2 g in sodium chloride  0.9 % 100 mL IVPB  Status:  Discontinued        2 g 200 mL/hr over 30 Minutes Intravenous Every 12 hours 05/29/23 1437 05/29/23 2211   05/29/23 1600  metroNIDAZOLE  (FLAGYL ) IVPB 500 mg  Status:  Discontinued        500 mg 100 mL/hr over 60 Minutes Intravenous Every 12 hours 05/29/23 1511 05/31/23 0812   05/29/23 1500  vancomycin   (VANCOREADY) IVPB 2000 mg/400 mL        2,000 mg 200 mL/hr over 120 Minutes Intravenous  Once 05/29/23 1437 05/29/23 1724   05/29/23 0430  ceFEPIme  (MAXIPIME ) 2 g in sodium chloride  0.9 % 100 mL IVPB        2 g 200 mL/hr over 30 Minutes Intravenous  Once 05/29/23 0416 05/29/23 0523       Objective: Vitals:   06/01/23 2155 06/02/23 0626  BP: (!) 104/52 122/68  Pulse: (!) 101 (!) 104  Resp: 18 20  Temp: 97.8 F (36.6 C) (!) 97.5 F (36.4 C)  SpO2: 96% 97%    Intake/Output Summary (Last 24 hours) at 06/02/2023 1114 Last data filed at 06/01/2023 1945 Gross per 24 hour  Intake 1777.62 ml  Output --  Net 1777.62 ml   Filed Weights   05/29/23 0304 05/29/23 1416  Weight: 93.4 kg 89.6 kg   Weight change:  Body mass index is 33.9 kg/m.   Physical Exam: General exam: Very pleasant, elderly Caucasian female.  Not in pain Skin: No rashes, lesions or ulcers. HEENT: Atraumatic, normocephalic, no obvious bleeding Lungs: Clear to auscultation bilaterally CVS: S1, S2, no murmur,   GI/Abd: Soft, mild tenderness present in lower abdomen, nondistended, bowel sound present,   CNS: Alert, awake, oriented x 3 Psychiatry: Mood appropriate,  Extremities: No pedal edema, no calf tenderness  Data Review: I have personally reviewed the laboratory data and studies available.  F/u labs ordered Unresulted Labs (From admission, onward)     Start     Ordered   06/02/23 0741  Prepare platelet pheresis  (Blood Administration Adult)  Once,   R       Question Answer Comment  Number of Apheresis Units (1 unit of apheresis platelets will increase platelets 30,000/mL in an avg sized adult) 1 unit   Transfusion Indications Plt = 10,000   Date/Time blood product needed For transfusion   If emergent release call blood bank Not emergent release      06/02/23 0741   05/31/23 0500  CBC with Differential/Platelet  Daily,   R  05/30/23 0653   05/31/23 0500  Comprehensive metabolic panel  Daily,   R       05/30/23 0865            Signed, Hoyt Macleod, MD [facility] 06/02/2023

## 2023-06-02 NOTE — Progress Notes (Signed)
 Subjective/Chief Complaint: Still with some abdominal tenderness, but improved from last several days.  Tenderness localized to LLQ Afebrile FlexiSeal - some diarrhea WBC improving  Objective: Vital signs in last 24 hours: Temp:  [97.5 F (36.4 C)-98.1 F (36.7 C)] 97.5 F (36.4 C) (05/03 0626) Pulse Rate:  [95-104] 104 (05/03 0626) Resp:  [18-20] 20 (05/03 0626) BP: (104-130)/(52-79) 122/68 (05/03 0626) SpO2:  [96 %-98 %] 97 % (05/03 0626) Last BM Date : 06/01/23  Intake/Output from previous day: 05/02 0701 - 05/03 0700 In: 2017.6 [P.O.:480; I.V.:925.6; Blood:612] Out: -  Intake/Output this shift: No intake/output data recorded.  Elderly, NAD Awake, alert Abd - obese, soft, tender in LLQ; no rebound or guarding  Lab Results:  Recent Labs    06/01/23 0618 06/02/23 0524  WBC 0.8* 2.1*  HGB 7.6* 8.8*  HCT 23.8* 27.6*  PLT 7* 9*   BMET Recent Labs    06/01/23 0618 06/02/23 0524  NA 136 139  K 3.3* 3.4*  CL 113* 115*  CO2 17* 14*  GLUCOSE 107* 133*  BUN 34* 27*  CREATININE 0.90 0.87  CALCIUM  9.1 9.6    Anti-infectives: Anti-infectives (From admission, onward)    Start     Dose/Rate Route Frequency Ordered Stop   05/31/23 1600  vancomycin  (VANCOREADY) IVPB 1250 mg/250 mL  Status:  Discontinued        1,250 mg 166.7 mL/hr over 90 Minutes Intravenous Every 48 hours 05/29/23 1437 05/30/23 1416   05/31/23 1445  fidaxomicin  (DIFICID ) tablet 200 mg        200 mg Oral 2 times daily 05/31/23 1353 06/10/23 0959   05/31/23 1345  vancomycin  (VANCOCIN ) capsule 125 mg  Status:  Discontinued        125 mg Oral Daily 05/31/23 1253 05/31/23 1353   05/31/23 1000  fidaxomicin  (DIFICID ) tablet 200 mg  Status:  Discontinued        200 mg Oral 2 times daily 05/31/23 0812 05/31/23 0813   05/31/23 1000  metroNIDAZOLE  (FLAGYL ) IVPB 500 mg        500 mg 100 mL/hr over 60 Minutes Intravenous Every 12 hours 05/31/23 0813     05/29/23 2300  cefTRIAXone  (ROCEPHIN ) 2 g in  sodium chloride  0.9 % 100 mL IVPB        2 g 200 mL/hr over 30 Minutes Intravenous Every 24 hours 05/29/23 2211     05/29/23 1700  ceFEPIme  (MAXIPIME ) 2 g in sodium chloride  0.9 % 100 mL IVPB  Status:  Discontinued        2 g 200 mL/hr over 30 Minutes Intravenous Every 12 hours 05/29/23 1437 05/29/23 2211   05/29/23 1600  metroNIDAZOLE  (FLAGYL ) IVPB 500 mg  Status:  Discontinued        500 mg 100 mL/hr over 60 Minutes Intravenous Every 12 hours 05/29/23 1511 05/31/23 0812   05/29/23 1500  vancomycin  (VANCOREADY) IVPB 2000 mg/400 mL        2,000 mg 200 mL/hr over 120 Minutes Intravenous  Once 05/29/23 1437 05/29/23 1724   05/29/23 0430  ceFEPIme  (MAXIPIME ) 2 g in sodium chloride  0.9 % 100 mL IVPB        2 g 200 mL/hr over 30 Minutes Intravenous  Once 05/29/23 0416 05/29/23 0523       Assessment/Plan: Diverticulitis with abscess and recurrent/persistent C. difficile colitis  - afebrile, leukopenia/thrombocytopenia - platelet transfusion today per Oncology - has a pericolonic abscess that was reviewed by IR and is not amenable  to drainage at present. - continue IV abx - she is still having some abdominal tenderness and cramping, but no peritonitis. Recommend continuation of CLD,  not advancing diet today. I have added mylicon to see if it gives her any added pain relief.  - no emergent surgical needs. We will follow. Should her condition worsen I do not know that she would be an operative candidate given her significant comorbidities listed below. Her perioperative morbidity and mortality risk is high.   FEN - CLD VTE -on hold due to significant thrombocytopenia ID -Rocephin /Flagyl /Dificid     Pancytopenia likely secondary to chemotherapy E. coli bacteremia Cirrhosis Small cell lung cancer, currently on chemotherapy with metastatic disease Diabetes mellitus GERD History of renal cell cancer, status post left nephrectomy Hypertension Obesity Chronic kidney disease Hyperlipidemia   LOS: 4 days    Rella Cardinal 06/02/2023

## 2023-06-02 NOTE — Plan of Care (Signed)
  Problem: Education: Goal: Knowledge of General Education information will improve Description: Including pain rating scale, medication(s)/side effects and non-pharmacologic comfort measures Outcome: Progressing   Problem: Health Behavior/Discharge Planning: Goal: Ability to manage health-related needs will improve Outcome: Progressing   Problem: Clinical Measurements: Goal: Ability to maintain clinical measurements within normal limits will improve Outcome: Progressing Goal: Will remain free from infection Outcome: Progressing Goal: Diagnostic test results will improve Outcome: Progressing Goal: Respiratory complications will improve Outcome: Progressing Goal: Cardiovascular complication will be avoided Outcome: Progressing   Problem: Activity: Goal: Risk for activity intolerance will decrease Outcome: Progressing   Problem: Nutrition: Goal: Adequate nutrition will be maintained Outcome: Progressing   Problem: Coping: Goal: Level of anxiety will decrease Outcome: Progressing   Problem: Elimination: Goal: Will not experience complications related to bowel motility Outcome: Progressing Note: Fecal management system in place Goal: Will not experience complications related to urinary retention Outcome: Progressing   Problem: Pain Managment: Goal: General experience of comfort will improve and/or be controlled Outcome: Progressing   Problem: Safety: Goal: Ability to remain free from injury will improve Outcome: Progressing   Problem: Skin Integrity: Goal: Risk for impaired skin integrity will decrease Outcome: Progressing   Problem: Education: Goal: Ability to describe self-care measures that may prevent or decrease complications (Diabetes Survival Skills Education) will improve Outcome: Progressing Goal: Individualized Educational Video(s) Outcome: Progressing   Problem: Coping: Goal: Ability to adjust to condition or change in health will improve Outcome:  Progressing   Problem: Fluid Volume: Goal: Ability to maintain a balanced intake and output will improve Outcome: Progressing   Problem: Health Behavior/Discharge Planning: Goal: Ability to identify and utilize available resources and services will improve Outcome: Progressing Goal: Ability to manage health-related needs will improve Outcome: Progressing   Problem: Metabolic: Goal: Ability to maintain appropriate glucose levels will improve Outcome: Progressing   Problem: Nutritional: Goal: Maintenance of adequate nutrition will improve Outcome: Progressing Goal: Progress toward achieving an optimal weight will improve Outcome: Progressing   Problem: Skin Integrity: Goal: Risk for impaired skin integrity will decrease Outcome: Progressing   Problem: Tissue Perfusion: Goal: Adequacy of tissue perfusion will improve Outcome: Progressing

## 2023-06-02 NOTE — Evaluation (Signed)
 Occupational Therapy Evaluation Patient Details Name: Amanda Crawford MRN: [id number] DOB: [birth year] Today's Date: 06/02/2023   History of Present Illness   Amanda Crawford is a 78 y.o. female admitted with sepsis; 4/30 C. difficile PCR and antigen positive. PMH significant for small cell lung cancer, prior renal cell cancer with lung mets, DM2, CKD, recently hospitalized in March for septic shock diverticulitis and C. difficile infection     Clinical Impressions PTA patient was living at home with husband and family and was relatively independent. Currently, patient presents with deficits oultined below (see OT Problem List for details) most significantly generalized weakness, decreased activity tolerance and balance impacting all BADL's and functional mobility. With assistance and family support, OT recommending HHOT services. Patient reports having all necessary DME but OT will educate on energy conservation and AE needs. OT will follow acutely to progress functional level.      If plan is discharge home, recommend the following:   A lot of help with walking and/or transfers;A lot of help with bathing/dressing/bathroom;Assistance with cooking/housework;Assistance with feeding;Direct supervision/assist for medications management;Direct supervision/assist for financial management;Assist for transportation;Help with stairs or ramp for entrance     Functional Status Assessment   Patient has had a recent decline in their functional status and demonstrates the ability to make significant improvements in function in a reasonable and predictable amount of time.     Equipment Recommendations   Other (comment) (recommending AE ie reacher and OT will provide handout and info to obtain)      Precautions/Restrictions   Precautions Precautions: Fall Recall of Precautions/Restrictions: Intact Precaution/Restrictions Comments: enteric Restrictions Weight Bearing Restrictions Per Provider  Order: No     Mobility Bed Mobility Overal bed mobility: Needs Assistance Bed Mobility: Rolling, Supine to Sit, Sit to Supine Rolling: Supervision, Used rails   Supine to sit: Contact guard, HOB elevated, Used rails Sit to supine: Contact guard assist, HOB elevated, Used rails        Transfers Overall transfer level: Needs assistance Equipment used: Rolling walker (2 wheels) Transfers: Sit to/from Stand, Bed to chair/wheelchair/BSC Sit to Stand: Contact guard assist     Step pivot transfers: Min assist     General transfer comment: cues to integrate breathing and pacing due to mild SOB this session      Balance Overall balance assessment: Needs assistance Sitting-balance support: Feet supported Sitting balance-Leahy Scale: Fair     Standing balance support: Reliant on assistive device for balance, During functional activity, Bilateral upper extremity supported Standing balance-Leahy Scale: Poor                             ADL either performed or assessed with clinical judgement   ADL Overall ADL's : Needs assistance/impaired Eating/Feeding: Set up (on liquid diet)   Grooming: Set up;Sitting   Upper Body Bathing: Minimal assistance;Sitting   Lower Body Bathing: Maximal assistance;Sitting/lateral leans;Sit to/from stand;Bed level   Upper Body Dressing : Minimal assistance;Sitting   Lower Body Dressing: Maximal assistance;Sitting/lateral leans;Sit to/from stand   Toilet Transfer: Minimal assistance;BSC/3in1   Toileting- Clothing Manipulation and Hygiene: Minimal assistance;Sitting/lateral lean;Sit to/from stand       Functional mobility during ADLs: Minimal assistance;Rolling walker (2 wheels) General ADL Comments: fatigues easily     Vision Baseline Vision/History: 1 Wears glasses;0 No visual deficits Ability to See in Adequate Light: 0 Adequate Patient Visual Report: No change from baseline Vision Assessment?: No apparent visual  deficits;Wears glasses  for reading     Perception Perception: Within Functional Limits       Praxis Praxis: [facility]       Pertinent Vitals/Pain Pain Assessment Pain Assessment: No/denies pain     Extremity/Trunk Assessment Upper Extremity Assessment Upper Extremity Assessment: Generalized weakness;Right hand dominant   Lower Extremity Assessment Lower Extremity Assessment: Generalized weakness   Cervical / Trunk Assessment Cervical / Trunk Assessment: Normal   Communication Communication Communication: No apparent difficulties   Cognition Arousal: Alert Behavior During Therapy: WFL for tasks assessed/performed Cognition: No apparent impairments                               Following commands: Intact       Cueing  General Comments   Cueing Techniques: Verbal cues  SpO2 97% on RA but mild DOE with mobility and ADL's           Home Living Family/patient expects to be discharged to:: Private residence Living Arrangements: Spouse/significant other Available Help at Discharge: Family;Available 24 hours/day Type of Home: House Home Access: Ramped entrance     Home Layout: Two level;Able to live on main level with bedroom/bathroom Alternate Level Stairs-Number of Steps: flight   Bathroom Shower/Tub: Walk-in shower;Curtain   Bathroom Toilet: Standard Bathroom Accessibility: Yes How Accessible: Accessible via walker Home Equipment: Rolling Walker (2 wheels);Grab bars - tub/shower;Shower seat;Hand held shower head;BSC/3in1          Prior Functioning/Environment Prior Level of Function : Independent/Modified Independent             Mobility Comments: Pt reports ind with community amb without AD, reports 1 fall while transferring on/off toilet and was able to get up ind ADLs Comments: Pt reports ind with ADLs/IADLs    OT Problem List: Decreased strength;Decreased activity tolerance;Impaired balance (sitting and/or standing);Cardiopulmonary  status limiting activity;Obesity   OT Treatment/Interventions: Self-care/ADL training;Therapeutic exercise;Energy conservation;DME and/or AE instruction;Therapeutic activities;Patient/family education;Balance training      OT Goals(Current goals can be found in the care plan section)   Acute Rehab OT Goals Patient Stated Goal: to feel strong OT Goal Formulation: With patient Time For Goal Achievement: 06/16/23 Potential to Achieve Goals: Good ADL Goals Pt Will Perform Lower Body Bathing: with contact guard assist;sitting/lateral leans;sit to/from stand;with adaptive equipment Pt Will Perform Lower Body Dressing: with min assist;sitting/lateral leans;sit to/from stand;with adaptive equipment Pt Will Transfer to Toilet: bedside commode;ambulating;grab bars;with contact guard assist Pt/caregiver will Perform Home Exercise Program: Increased strength;With written HEP provided;With Supervision Additional ADL Goal #1: Patient will teach back 4/4 ECTs for ADL's and mobility with min cues   OT Frequency:  Min 2X/week       AM-PAC OT "6 Clicks" Daily Activity     Outcome Measure Help from another person eating meals?: A Little Help from another person taking care of personal grooming?: A Little Help from another person toileting, which includes using toliet, bedpan, or urinal?: A Little Help from another person bathing (including washing, rinsing, drying)?: A Lot Help from another person to put on and taking off regular upper body clothing?: A Little Help from another person to put on and taking off regular lower body clothing?: A Lot 6 Click Score: 16   End of Session Equipment Utilized During Treatment: Gait belt;Rolling walker (2 wheels) Nurse Communication: Mobility status  Activity Tolerance: Patient limited by fatigue Patient left: in bed;with bed alarm set  OT Visit Diagnosis: Unsteadiness on feet (R26.81);Muscle  weakness (generalized) (M62.81)                Time:  4098-1191 OT Time Calculation (min): 26 min Charges:  OT General Charges $OT Visit: 1 Visit OT Evaluation $OT Eval Low Complexity: 1 Low OT Treatments $Self Care/Home Management : 8-22 mins  Kynzli Rease OT/L Acute Rehabilitation Department  [phone]  06/02/2023, 10:49 AM

## 2023-06-03 DIAGNOSIS — D709 Neutropenia, unspecified: Secondary | ICD-10-CM | POA: Diagnosis not present

## 2023-06-03 DIAGNOSIS — K651 Peritoneal abscess: Secondary | ICD-10-CM | POA: Diagnosis not present

## 2023-06-03 DIAGNOSIS — A4151 Sepsis due to Escherichia coli [E. coli]: Secondary | ICD-10-CM | POA: Diagnosis not present

## 2023-06-03 DIAGNOSIS — K5792 Diverticulitis of intestine, part unspecified, without perforation or abscess without bleeding: Secondary | ICD-10-CM | POA: Diagnosis not present

## 2023-06-03 LAB — CBC WITH DIFFERENTIAL/PLATELET
Abs Immature Granulocytes: 0.54 10*3/uL — ABNORMAL HIGH (ref 0.00–0.07)
Basophils Absolute: 0 10*3/uL (ref 0.0–0.1)
Basophils Relative: 1 %
Eosinophils Absolute: 0 10*3/uL (ref 0.0–0.5)
Eosinophils Relative: 1 %
HCT: 24.5 % — ABNORMAL LOW (ref 36.0–46.0)
Hemoglobin: 8.1 g/dL — ABNORMAL LOW (ref 12.0–15.0)
Immature Granulocytes: 13 %
Lymphocytes Relative: 29 %
Lymphs Abs: 1.2 10*3/uL (ref 0.7–4.0)
MCH: 32.8 pg (ref 26.0–34.0)
MCHC: 33.1 g/dL (ref 30.0–36.0)
MCV: 99.2 fL (ref 80.0–100.0)
Monocytes Absolute: 0.4 10*3/uL (ref 0.1–1.0)
Monocytes Relative: 10 %
Neutro Abs: 2.1 10*3/uL (ref 1.7–7.7)
Neutrophils Relative %: 46 %
Platelets: 20 10*3/uL — CL (ref 150–400)
RBC: 2.47 MIL/uL — ABNORMAL LOW (ref 3.87–5.11)
RDW: 17.5 % — ABNORMAL HIGH (ref 11.5–15.5)
WBC: 4.3 10*3/uL (ref 4.0–10.5)
nRBC: 0.7 % — ABNORMAL HIGH (ref 0.0–0.2)

## 2023-06-03 LAB — GLUCOSE, CAPILLARY
Glucose-Capillary: 66 mg/dL — ABNORMAL LOW (ref 70–99)
Glucose-Capillary: 81 mg/dL (ref 70–99)
Glucose-Capillary: 105 mg/dL — ABNORMAL HIGH (ref 70–99)
Glucose-Capillary: 159 mg/dL — ABNORMAL HIGH (ref 70–99)
Glucose-Capillary: 165 mg/dL — ABNORMAL HIGH (ref 70–99)
Glucose-Capillary: 164 mg/dL — ABNORMAL HIGH (ref 70–99)

## 2023-06-03 LAB — COMPREHENSIVE METABOLIC PANEL WITH GFR
ALT: 30 U/L (ref 0–44)
AST: 25 U/L (ref 15–41)
Albumin: 2.1 g/dL — ABNORMAL LOW (ref 3.5–5.0)
Alkaline Phosphatase: 84 U/L (ref 38–126)
Anion gap: 8 (ref 5–15)
BUN: 19 mg/dL (ref 8–23)
CO2: 19 mmol/L — ABNORMAL LOW (ref 22–32)
Calcium: 9.2 mg/dL (ref 8.9–10.3)
Chloride: 114 mmol/L — ABNORMAL HIGH (ref 98–111)
Creatinine, Ser: 0.78 mg/dL (ref 0.44–1.00)
GFR, Estimated: >60 mL/min (ref >60)
Glucose, Bld: 71 mg/dL (ref 70–99)
Potassium: 2.9 mmol/L — ABNORMAL LOW (ref 3.5–5.1)
Sodium: 141 mmol/L (ref 135–145)
Total Bilirubin: 0.5 mg/dL (ref 0.0–1.2)
Total Protein: 4.5 g/dL — ABNORMAL LOW (ref 6.5–8.1)

## 2023-06-03 LAB — PHOSPHORUS: Phosphorus: 1.6 mg/dL — ABNORMAL LOW (ref 2.5–4.6)

## 2023-06-03 LAB — MAGNESIUM: Magnesium: 1.6 mg/dL — ABNORMAL LOW (ref 1.7–2.4)

## 2023-06-03 MED ORDER — MAGNESIUM SULFATE 2 GM/50ML IV SOLN
2.0000 g | Freq: Once | INTRAVENOUS | Status: AC
  Administered 2023-06-03: 2 g via INTRAVENOUS
  Filled 2023-06-03: qty 50

## 2023-06-03 MED ORDER — POTASSIUM CHLORIDE CRYS ER 20 MEQ PO TBCR
40.0000 meq | EXTENDED_RELEASE_TABLET | ORAL | Status: AC
  Administered 2023-06-03 (×2): 40 meq via ORAL
  Filled 2023-06-03 (×2): qty 2

## 2023-06-03 MED ORDER — K PHOS MONO-SOD PHOS DI & MONO 155-852-130 MG PO TABS
500.0000 mg | ORAL_TABLET | Freq: Once | ORAL | Status: AC
  Administered 2023-06-03: 500 mg via ORAL
  Filled 2023-06-03: qty 2

## 2023-06-03 NOTE — Progress Notes (Signed)
 Physical Therapy Treatment Patient Details Name: Amanda Crawford MRN: [id number] DOB: 02/06/1945 Today's Date: 06/03/2023   History of Present Illness Amanda Crawford is a 78 y.o. female admitted with sepsis; 4/30 C. difficile PCR and antigen positive. PMH significant for small cell lung cancer, prior renal cell cancer with lung mets, DM2, CKD, recently hospitalized in March for septic shock diverticulitis and C. difficile infection    PT Comments  Pt agreeable to working with therapy. CGA for safety. Dyspnea 3/4 but sats >90% during session. HR 115 bpm. Pt tolerated activity fairly well. Reports discomfort at catheter and flexiseal sites. Will continue progress activity as tolerated.     If plan is discharge home, recommend the following: A little help with walking and/or transfers;A little help with bathing/dressing/bathroom;Assistance with cooking/housework;Assist for transportation;Help with stairs or ramp for entrance   Can travel by private vehicle        Equipment Recommendations  None recommended by PT    Recommendations for Other Services       Precautions / Restrictions Precautions Precautions: Fall Recall of Precautions/Restrictions: Intact Precaution/Restrictions Comments: flexiseal Restrictions Weight Bearing Restrictions Per Provider Order: No     Mobility  Bed Mobility Overal bed mobility: Needs Assistance Bed Mobility: Supine to Sit Rolling: Supervision, Used rails   Supine to sit: HOB elevated, Used rails, Supervision     General bed mobility comments: increased time. assist to monitor catheter/flexiseal lines    Transfers Overall transfer level: Needs assistance Equipment used: Rolling walker (2 wheels) Transfers: Sit to/from Stand Sit to Stand: Contact guard assist           General transfer comment: Cues for safety, hand placement. Increased time.    Ambulation/Gait Ambulation/Gait assistance: Contact guard assist Gait Distance (Feet): 20  Feet Assistive device: Rolling walker (2 wheels) Gait Pattern/deviations: Step-through pattern, Decreased stride length       General Gait Details: CGA for safety. Dyspnea 3/4. Fatigues fairly easily/quickly.   Stairs             Wheelchair Mobility     Tilt Bed    Modified Rankin (Stroke Patients Only)       Balance Overall balance assessment: Needs assistance         Standing balance support: Reliant on assistive device for balance, During functional activity, Bilateral upper extremity supported Standing balance-Leahy Scale: Poor                              Communication Communication Communication: No apparent difficulties  Cognition Arousal: Alert Behavior During Therapy: WFL for tasks assessed/performed   PT - Cognitive impairments: No apparent impairments                         Following commands: Intact      Cueing Cueing Techniques: Verbal cues  Exercises      General Comments        Pertinent Vitals/Pain Pain Assessment Pain Assessment: Faces Faces Pain Scale: Hurts little more Pain Location: buttocks Pain Descriptors / Indicators: Discomfort Pain Intervention(s): Limited activity within patient's tolerance, Monitored during session, Repositioned    Home Living                          Prior Function            PT Goals (current goals can now be found in the  care plan section) Progress towards PT goals: Progressing toward goals    Frequency    Min 3X/week      PT Plan      Co-evaluation              AM-PAC PT "6 Clicks" Mobility   Outcome Measure  Help needed turning from your back to your side while in a flat bed without using bedrails?: A Little Help needed moving from lying on your back to sitting on the side of a flat bed without using bedrails?: A Little Help needed moving to and from a bed to a chair (including a wheelchair)?: A Little Help needed standing up from a  chair using your arms (e.g., wheelchair or bedside chair)?: A Little Help needed to walk in hospital room?: A Little Help needed climbing 3-5 steps with a railing? : A Lot 6 Click Score: 17    End of Session   Activity Tolerance: Patient tolerated treatment well;Patient limited by fatigue Patient left: in chair;with call bell/phone within reach;with family/visitor present   PT Visit Diagnosis: Muscle weakness (generalized) (M62.81);Difficulty in walking, not elsewhere classified (R26.2)     Time: 8295-6213 PT Time Calculation (min) (ACUTE ONLY): 24 min  Charges:    $Gait Training: 23-37 mins PT General Charges $$ ACUTE PT VISIT: 1 Visit                        Tanda Falter, PT Acute Rehabilitation  Office: [phone]

## 2023-06-03 NOTE — Plan of Care (Signed)

## 2023-06-03 NOTE — Plan of Care (Signed)

## 2023-06-03 NOTE — Progress Notes (Signed)
 [facility]  Date of Admission:  05/29/2023   Total days of inpatient antibiotics 4  Principal Problem:   Sepsis (HCC) Active Problems:   Neutropenia (HCC)   Enteritis due to Clostridium difficile          Assessment: 78 yo female with small cell lung cancer (mets to hip), hx [facility] pulm mets (left nephrectomy 2001), dm2, ckd3, dm2, diverticulitis 03/2023 admission finished augmentin  course complicated by cdiff at that time s/p 2 weeks po vanc, cirrhosis (imaging finding 03/2023), on chemo (last dose/cycle on 05/21/23), admitted with neutropenic fever, found to have ecoli bsi and intraabd abscess, along with new onset severe diarrhea     #cdiff Has always just had cdiff pcr not toxin positivity Hx does however in march and also this admission suggestive of cdiff infection Will trial fidaxamycin tapered course for recurrent cdiff; anticipate to be around 5 weeks. This should hopefully cover secondary prophylaxis duration for the systemic abx required for intraabd abscess   #diverticulitis #intraabd abscess #ecoli bsi #neutropenic fever Iv abx-->augmentin  short course finished 03/3023 Still imaging finding diverticulitis and abscess Abd pain all over ?cdiff related Suspect source for ecoli bsi is abscess.    One question remains is if the port has been seeded. Would not be able to tell. However reassuring that her sepsis had resolved. Plan to repeat bcx and if negative to treat for bsi/intraabd abscess. If recurrent ecoli bsi after diverticulitis/abscess resolve then port needs to be removed   Discussed with [facility]. Augmentin  mic on the higher side for ecoli but still sensitive. Will treat for 2 weeks if iv ceftriaxone  (will cover duration of bsi) and then transition to long augmentin  course (3-4 more weeks)    Will need secondary prophylaxis cdiff coverage while on systemic abx   5/4:  Rectal tube in place Abdominal pain  improving  wbc 4.3    Plan: -Continue ceftriaxone ; plan 2 weeks from 5/1 -Continue metronidazole  (for abscess/diverticulitis) -On 5/15 transition ceftriaxone  to augmentin  and can stop flagyl ; plan 4 weeks .-Continue fidaxomycin; plan tapered course of 7 weeks (to extend a week beyond diverticular abscess treatment). Pt notes abdominal pain improving -Maintain enteric isolation precaution for the duration of admission -General surgery following , possible ct next week.     Microbiology:   Antibiotics: 5/1-c fidaxomycin 4/29-c ceftriaxone                                                       4/29-c metronidazole      SUBJECTIVE: Abdominal pain improved Interval:   Review of Systems: Review of Systems  All other systems reviewed and are negative.    Scheduled Meds:  Chlorhexidine  Gluconate Cloth  6 each Topical Daily   dicyclomine   10 mg Oral TID AC & HS   fidaxomicin   200 mg Oral BID   filgrastim  (NIVESTYM ) SQ  480 mcg Subcutaneous Daily   Gerhardt's butt cream   Topical TID   insulin  aspart  0-15 Units Subcutaneous TID WC   insulin  aspart  0-5 Units Subcutaneous QHS   insulin  glargine-yfgn  14 Units Subcutaneous QHS   simethicone   80 mg Oral QID   sodium chloride  flush  10-40 mL Intracatheter Q12H   Continuous Infusions:  cefTRIAXone  (ROCEPHIN )  IV Stopped (06/02/23  2313)   metronidazole  500 mg (06/03/23 2120)   PRN Meds:.acetaminophen  **OR** [DISCONTINUED] acetaminophen , albuterol , dicyclomine , morphine  injection, ondansetron  **OR** ondansetron  (ZOFRAN ) IV, sodium chloride  flush Allergies  Allergen Reactions   Culturelle Probiotics [Lactobacillus]     Patient should not take probiotics while immunocompromised from chemo    Bactrim  Ds [Sulfamethoxazole -Trimethoprim ] Rash    OBJECTIVE: Vitals:   06/03/23 1229 06/03/23 1232 06/03/23 1311 06/03/23 2001  BP: 130/64 131/60 134/62 138/70  Pulse: (!) 105 100 (!) 101   Resp: (!) 26 (!) 21 20 17   Temp: 98.4 F (36.9  C) 98.6 F (37 C) 98.3 F (36.8 C) 97.6 F (36.4 C)  TempSrc: Oral  Oral Oral  SpO2: 95% 95% 95% 95%  Weight:      Height:       Body mass index is 33.9 kg/m.  Physical Exam Constitutional:      Appearance: Normal appearance.  HENT:     Head: Normocephalic and atraumatic.     Right Ear: Tympanic membrane normal.     Left Ear: Tympanic membrane normal.     Nose: Nose normal.     Mouth/Throat:     Mouth: Mucous membranes are moist.  Eyes:     Extraocular Movements: Extraocular movements intact.     Conjunctiva/sclera: Conjunctivae normal.     Pupils: Pupils are equal, round, and reactive to light.  Cardiovascular:     Rate and Rhythm: Normal rate and regular rhythm.     Heart sounds: No murmur heard.    No friction rub. No gallop.  Pulmonary:     Effort: Pulmonary effort is normal.     Breath sounds: Normal breath sounds.  Abdominal:     General: Abdomen is flat.     Palpations: Abdomen is soft.  Musculoskeletal:        General: Normal range of motion.  Skin:    General: Skin is warm and dry.  Neurological:     General: No focal deficit present.     Mental Status: She is alert and oriented to person, place, and time.  Psychiatric:        Mood and Affect: Mood normal.       Lab Results Lab Results  Component Value Date   WBC 4.3 06/03/2023   HGB 8.1 (L) 06/03/2023   HCT 24.5 (L) 06/03/2023   MCV 99.2 06/03/2023   PLT 20 (LL) 06/03/2023    Lab Results  Component Value Date   CREATININE 0.78 06/03/2023   BUN 19 06/03/2023   NA 141 06/03/2023   K 2.9 (L) 06/03/2023   CL 114 (H) 06/03/2023   CO2 19 (L) 06/03/2023    Lab Results  Component Value Date   ALT 30 06/03/2023   AST 25 06/03/2023   ALKPHOS 84 06/03/2023   BILITOT 0.5 06/03/2023        Orlie Bjornstad, MD [facility] 06/03/2023, 11:15 PM

## 2023-06-03 NOTE — Progress Notes (Signed)
 PROGRESS NOTE  RICCI SHONG  DOB: Apr 11, 1945  PCP: Neda Balk, MD LKG:[id number]  DOA: 05/29/2023  LOS: 5 days  Hospital Day: 6  Brief narrative: Amanda Crawford is a 78 y.o. female with PMH significant for small cell lung cancer, prior renal cell cancer with lung mets, DM2, CKD, recently hospitalized in March for septic shock diverticulitis and C. difficile infection, discharged on oral Augmentin  and oral vancomycin . With the completion of antibiotics, patient's symptoms improved but few days ago, started again 4/29, patient presented to the ED with complaint of persistent watery diarrhea weakness, poor oral intake, confusion.  Initial labs with WBC count significantly low at 0.4, hemoglobin 10.9, platelet 48, sodium 130, BUN/creatinine 46/1.35, troponin elevated to 170 Chest x-ray, CT head unremarkable for acute pathology CT angio chest did not show any evidence of pulm embolism, showed right lower lobe mass, liver cirrhosis. Started on broad-spectrum antibiotics for neutropenia  Admitted to [facility] Blood culture sent on admission is growing E. coli, pending sensitivity 4/30, C. difficile PCR positive 4/30, CT abdomen and pelvis showed sigmoid diverticulitis, 3.5 X 4.4 X 5.6 cm abscess.  Subjective: Patient was seen and examined this morning. Lying on bed.  Not in distress.  Has Foley catheter with clear urine. Has Flexi-Seal draining liquid stool. Abdominal tenderness improving. No family at bedside. WBC count improved to normal range.  Assessment and plan: Sepsis POA Sigmoid diverticulitis and abscess E. coli bacteremia Immunocompromise status ID, general surgery and IR were consulted  Per IR, abscess is not amenable to percutaneous drainage. Per ID recommendation, patient is currently on IV Rocephin , and IV Flagyl . Neutropenic precautions to continue Recent Labs  Lab 05/29/23 0303 05/29/23 0316 05/31/23 0400 06/01/23 0618 06/02/23 0524 06/03/23 0546  WBC 0.4*   --  0.4* 0.8* 2.1* 4.3  LATICACIDVEN  --  1.7  --   --   --   --    Acute diarrhea  C. difficile colitis Patient had recently completed treatment for C. difficile in March with oral vancomycin  Presented with recurrence of diarrhea, poor oral intake 4/30, C. difficile PCR and antigen positive. Currently on Dificid .  Continues to have loose bowel movement. Flexi-Seal in place with liquid stool There is also concern that she could be having diarrhea due to autoimmune colitis from cancer treatment.  Specially because C. difficile toxin is negative.  Acute on chronic anemia  Patient has chronic macrocytic anemia related to cancer  Hemoglobin improved after 1 unit PRBC but hemoglobin downtrending again.  No active bleeding. Continue iron supplement Was also on aspirin , currently on hold Recent Labs    09/07/22 1110 10/12/22 1036 02/08/23 1110 03/14/23 1116 05/29/23 0303 05/31/23 0400 06/01/23 0618 06/02/23 0524 06/03/23 0546  HGB  --    < > 13.0   < > 10.3* 8.3* 7.6* 8.8* 8.1*  MCV  --    < > 101.7*   < > 97.4 99.2 101.7* 102.2* 99.2  VITAMINB12 612  --  784  --   --   --   --   --   --    < > = values in this interval not displayed.    Severe thrombocytopenia Platelet level significantly low, thankfully no bleeding. 2 units of platelet transfused so far.  Platelet improved to 20. Continue SCDs for DVT prophylaxis.   Recent Labs  Lab 05/29/23 0303 05/31/23 0400 06/01/23 0618 06/02/23 0524 06/03/23 0546  PLT 48* 15* 7* 9* 20*    Extensive stage small cell  lung cancer on chemotherapy Under the care of Dr. Maria Shiner Per oncology note, she had 3 cycles of chemotherapy, last on 05/21/2023.    Neutropenia Due to chemotherapy.  Neupogen  added by oncology.  WBC count rising up gradually. Neutropenic precautions.    Type 2 diabetes mellitus A1c 7.6 on 02/08/2023 PTA meds-Semglee  nightly.  No longer on glimepiride  because of hypoglycemia episodes recently Currently not on  scheduled insulin , continue SSI/Accu-Cheks Recent Labs  Lab 06/02/23 1742 06/02/23 2115 06/03/23 0736 06/03/23 0806 06/03/23 1147  GLUCAP 123* 90 66* 81 105*   CKD 3B Acute metabolic acidosis Renal function at baseline.  Continue to monitor BUN improving.  Serum bicarb level was low. On sodium bicarb drip  Recent Labs    04/11/23 0242 04/12/23 0333 04/16/23 1321 04/30/23 0910 05/21/23 1003 05/29/23 0303 05/31/23 0400 06/01/23 0618 06/02/23 0524 06/03/23 0546  BUN 23 20 15 22  28* 46* 36* 34* 27* 19  CREATININE 1.27* 1.33* 1.24* 1.53* 1.59* 1.35* 1.00 0.90 0.87 0.78  CO2 17* 18* 26 26 25  19* 18* 17* 14* 19*   Hypertension PTA meds- Coreg  half tab of 3.125 mg twice daily Coreg  currently on hold due to low blood pressure, poor oral intake and diarrhea.  Liver cirrhosis new finding on CT scan during her last hospital stay, will need outpatient GI follow-up for this   Hyperlipidemia statin on hold due to liver cirrhosis.   Mobility: Impaired mobility due to chemo.  Continues to need PT  Goals of care   Code Status: Full Code     DVT prophylaxis:  Place and maintain sequential compression device Start: 05/31/23 0835   Antimicrobials: IV Rocephin , IV Flagyl , and Dificid  Fluid: Sodium bicarb drip Consultants: Oncology, ID Family Communication: Family not at at bedside  Status: Inpatient Level of care:  Telemetry Medical   Patient is from: Home Needs to continue in-hospital care: Ongoing workup for diarrhea, diverticulitis, abscess Anticipated d/c to: Pending clinical course   Diet:  Diet Order             Diet full liquid Fluid consistency: Thin  Diet effective now                   Scheduled Meds:  Chlorhexidine  Gluconate Cloth  6 each Topical Daily   dicyclomine   10 mg Oral TID AC & HS   fidaxomicin   200 mg Oral BID   filgrastim  (NIVESTYM ) SQ  480 mcg Subcutaneous Daily   Gerhardt's butt cream   Topical TID   insulin  aspart  0-15 Units  Subcutaneous TID WC   insulin  aspart  0-5 Units Subcutaneous QHS   insulin  glargine-yfgn  14 Units Subcutaneous QHS   simethicone   80 mg Oral QID   sodium chloride  flush  10-40 mL Intracatheter Q12H    PRN meds: acetaminophen  **OR** [DISCONTINUED] acetaminophen , albuterol , dicyclomine , morphine  injection, ondansetron  **OR** ondansetron  (ZOFRAN ) IV, sodium chloride  flush   Infusions:   cefTRIAXone  (ROCEPHIN )  IV Stopped (06/02/23 2313)   metronidazole  500 mg (06/03/23 0826)    Antimicrobials: Anti-infectives (From admission, onward)    Start     Dose/Rate Route Frequency Ordered Stop   05/31/23 1600  vancomycin  (VANCOREADY) IVPB 1250 mg/250 mL  Status:  Discontinued        1,250 mg 166.7 mL/hr over 90 Minutes Intravenous Every 48 hours 05/29/23 1437 05/30/23 1416   05/31/23 1445  fidaxomicin  (DIFICID ) tablet 200 mg        200 mg Oral 2 times daily 05/31/23 1353  06/10/23 0959   05/31/23 1345  vancomycin  (VANCOCIN ) capsule 125 mg  Status:  Discontinued        125 mg Oral Daily 05/31/23 1253 05/31/23 1353   05/31/23 1000  fidaxomicin  (DIFICID ) tablet 200 mg  Status:  Discontinued        200 mg Oral 2 times daily 05/31/23 0812 05/31/23 0813   05/31/23 1000  metroNIDAZOLE  (FLAGYL ) IVPB 500 mg        500 mg 100 mL/hr over 60 Minutes Intravenous Every 12 hours 05/31/23 0813     05/29/23 2300  cefTRIAXone  (ROCEPHIN ) 2 g in sodium chloride  0.9 % 100 mL IVPB        2 g 200 mL/hr over 30 Minutes Intravenous Every 24 hours 05/29/23 2211     05/29/23 1700  ceFEPIme  (MAXIPIME ) 2 g in sodium chloride  0.9 % 100 mL IVPB  Status:  Discontinued        2 g 200 mL/hr over 30 Minutes Intravenous Every 12 hours 05/29/23 1437 05/29/23 2211   05/29/23 1600  metroNIDAZOLE  (FLAGYL ) IVPB 500 mg  Status:  Discontinued        500 mg 100 mL/hr over 60 Minutes Intravenous Every 12 hours 05/29/23 1511 05/31/23 0812   05/29/23 1500  vancomycin  (VANCOREADY) IVPB 2000 mg/400 mL        2,000 mg 200 mL/hr over  120 Minutes Intravenous  Once 05/29/23 1437 05/29/23 1724   05/29/23 0430  ceFEPIme  (MAXIPIME ) 2 g in sodium chloride  0.9 % 100 mL IVPB        2 g 200 mL/hr over 30 Minutes Intravenous  Once 05/29/23 0416 05/29/23 0523       Objective: Vitals:   06/03/23 1229 06/03/23 1311  BP: 130/64 134/62  Pulse: (!) 105 (!) 101  Resp: (!) 26 20  Temp: 98.4 F (36.9 C) 98.3 F (36.8 C)  SpO2: 95% 95%    Intake/Output Summary (Last 24 hours) at 06/03/2023 1434 Last data filed at 06/03/2023 1021 Gross per 24 hour  Intake 2372.81 ml  Output 2555 ml  Net -182.19 ml   Filed Weights   05/29/23 0304 05/29/23 1416  Weight: 93.4 kg 89.6 kg   Weight change:  Body mass index is 33.9 kg/m.   Physical Exam: General exam: Very pleasant, elderly Caucasian female.  Not in pain Skin: No rashes, lesions or ulcers. HEENT: Atraumatic, normocephalic, no obvious bleeding Lungs: Clear to auscultation bilaterally CVS: S1, S2, no murmur,   GI/Abd: Soft, mild tenderness present in lower abdomen, nondistended, bowel sound present,   CNS: Alert, awake, oriented x 3 Psychiatry: Frustrated Extremities: No pedal edema, no calf tenderness  Data Review: I have personally reviewed the laboratory data and studies available.  F/u labs ordered Unresulted Labs (From admission, onward)     Start     Ordered   06/02/23 0741  Prepare platelet pheresis  (Blood Administration Adult)  Once,   R       Question Answer Comment  Number of Apheresis Units (1 unit of apheresis platelets will increase platelets 30,000/mL in an avg sized adult) 1 unit   Transfusion Indications Plt = 10,000   Date/Time blood product needed For transfusion   If emergent release call blood bank Not emergent release      06/02/23 0741   05/31/23 0500  CBC with Differential/Platelet  Daily,   R      05/30/23 0653   05/31/23 0500  Comprehensive metabolic panel  Daily,   R  05/30/23 8295            Signed, Hoyt Macleod, MD [facility] 06/03/2023

## 2023-06-03 NOTE — Progress Notes (Signed)
 Subjective/Chief Complaint: Afebrile WBC now in normal range Received platelets yesterday, still thrombocytopenic Reports less tender in LLQ Still having some diarrhea via Flexiseal No nausea or vomiting  Objective: Vital signs in last 24 hours: Temp:  [97.4 F (36.3 C)-98.3 F (36.8 C)] 97.8 F (36.6 C) (05/04 0405) Pulse Rate:  [88-104] 94 (05/04 0405) Resp:  [18-24] 18 (05/04 0405) BP: (126-153)/(50-70) 126/50 (05/04 0405) SpO2:  [95 %-99 %] 95 % (05/04 0405) Last BM Date : 06/01/23  Intake/Output from previous day: 05/03 0701 - 05/04 0700 In: 1991.3 [P.O.:360; I.V.:881.3; Blood:550; IV Piggyback:200] Out: 1555 [Urine:1455; Stool:100] Intake/Output this shift: No intake/output data recorded.  Elderly, NAD Awake, alert Abd - obese, soft, mildly tender in LLQ; no rebound or guarding  Lab Results:  Recent Labs    06/02/23 0524 06/03/23 0546  WBC 2.1* 4.3  HGB 8.8* 8.1*  HCT 27.6* 24.5*  PLT 9* 20*   BMET Recent Labs    06/02/23 0524 06/03/23 0546  NA 139 141  K 3.4* 2.9*  CL 115* 114*  CO2 14* 19*  GLUCOSE 133* 71  BUN 27* 19  CREATININE 0.87 0.78  CALCIUM  9.6 9.2     Anti-infectives: Anti-infectives (From admission, onward)    Start     Dose/Rate Route Frequency Ordered Stop   05/31/23 1600  vancomycin  (VANCOREADY) IVPB 1250 mg/250 mL  Status:  Discontinued        1,250 mg 166.7 mL/hr over 90 Minutes Intravenous Every 48 hours 05/29/23 1437 05/30/23 1416   05/31/23 1445  fidaxomicin  (DIFICID ) tablet 200 mg        200 mg Oral 2 times daily 05/31/23 1353 06/10/23 0959   05/31/23 1345  vancomycin  (VANCOCIN ) capsule 125 mg  Status:  Discontinued        125 mg Oral Daily 05/31/23 1253 05/31/23 1353   05/31/23 1000  fidaxomicin  (DIFICID ) tablet 200 mg  Status:  Discontinued        200 mg Oral 2 times daily 05/31/23 0812 05/31/23 0813   05/31/23 1000  metroNIDAZOLE  (FLAGYL ) IVPB 500 mg        500 mg 100 mL/hr over 60 Minutes Intravenous Every 12  hours 05/31/23 0813     05/29/23 2300  cefTRIAXone  (ROCEPHIN ) 2 g in sodium chloride  0.9 % 100 mL IVPB        2 g 200 mL/hr over 30 Minutes Intravenous Every 24 hours 05/29/23 2211     05/29/23 1700  ceFEPIme  (MAXIPIME ) 2 g in sodium chloride  0.9 % 100 mL IVPB  Status:  Discontinued        2 g 200 mL/hr over 30 Minutes Intravenous Every 12 hours 05/29/23 1437 05/29/23 2211   05/29/23 1600  metroNIDAZOLE  (FLAGYL ) IVPB 500 mg  Status:  Discontinued        500 mg 100 mL/hr over 60 Minutes Intravenous Every 12 hours 05/29/23 1511 05/31/23 0812   05/29/23 1500  vancomycin  (VANCOREADY) IVPB 2000 mg/400 mL        2,000 mg 200 mL/hr over 120 Minutes Intravenous  Once 05/29/23 1437 05/29/23 1724   05/29/23 0430  ceFEPIme  (MAXIPIME ) 2 g in sodium chloride  0.9 % 100 mL IVPB        2 g 200 mL/hr over 30 Minutes Intravenous  Once 05/29/23 0416 05/29/23 0523       Assessment/Plan: Diverticulitis with abscess and recurrent/persistent C. difficile colitis  - afebrile, leukopenia/thrombocytopenia - platelet transfusion today per Oncology - has a pericolonic abscess that  was reviewed by IR and is not amenable to drainage at present.; consider repeat CT scan next week - continue IV abx - she is still having some mild abdominal tenderness and cramping, but no peritonitis.  - Advance to FLD - no emergent surgical needs.    FEN - FLD; advance diet as tolerated VTE -on hold due to significant thrombocytopenia ID -Rocephin /Flagyl /Dificid    Pancytopenia likely secondary to chemotherapy E. coli bacteremia Cirrhosis Small cell lung cancer, currently on chemotherapy with metastatic disease Diabetes mellitus GERD History of renal cell cancer, status post left nephrectomy Hypertension Obesity Chronic kidney disease Hyperlipidemia  LOS: 5 days    Rella Cardinal 06/03/2023

## 2023-06-04 DIAGNOSIS — K5792 Diverticulitis of intestine, part unspecified, without perforation or abscess without bleeding: Secondary | ICD-10-CM | POA: Diagnosis not present

## 2023-06-04 DIAGNOSIS — A0472 Enterocolitis due to Clostridium difficile, not specified as recurrent: Secondary | ICD-10-CM | POA: Diagnosis not present

## 2023-06-04 DIAGNOSIS — K651 Peritoneal abscess: Secondary | ICD-10-CM | POA: Diagnosis not present

## 2023-06-04 DIAGNOSIS — A4151 Sepsis due to Escherichia coli [E. coli]: Secondary | ICD-10-CM | POA: Diagnosis not present

## 2023-06-04 LAB — CBC WITH DIFFERENTIAL/PLATELET
Abs Immature Granulocytes: 2.47 10*3/uL — ABNORMAL HIGH (ref 0.00–0.07)
Basophils Absolute: 0 10*3/uL (ref 0.0–0.1)
Basophils Relative: 0 %
Eosinophils Absolute: 0.1 10*3/uL (ref 0.0–0.5)
Eosinophils Relative: 0 %
HCT: 27 % — ABNORMAL LOW (ref 36.0–46.0)
Hemoglobin: 8.9 g/dL — ABNORMAL LOW (ref 12.0–15.0)
Immature Granulocytes: 13 %
Lymphocytes Relative: 13 %
Lymphs Abs: 2.5 10*3/uL (ref 0.7–4.0)
MCH: 33 pg (ref 26.0–34.0)
MCHC: 33 g/dL (ref 30.0–36.0)
MCV: 100 fL (ref 80.0–100.0)
Monocytes Absolute: 1.6 10*3/uL — ABNORMAL HIGH (ref 0.1–1.0)
Monocytes Relative: 8 %
Neutro Abs: 12.9 10*3/uL — ABNORMAL HIGH (ref 1.7–7.7)
Neutrophils Relative %: 66 %
Platelets: 27 10*3/uL — CL (ref 150–400)
RBC: 2.7 MIL/uL — ABNORMAL LOW (ref 3.87–5.11)
RDW: 17.6 % — ABNORMAL HIGH (ref 11.5–15.5)
WBC: 19.5 10*3/uL — ABNORMAL HIGH (ref 4.0–10.5)
nRBC: 0.7 % — ABNORMAL HIGH (ref 0.0–0.2)

## 2023-06-04 LAB — GLUCOSE, CAPILLARY
Glucose-Capillary: 89 mg/dL (ref 70–99)
Glucose-Capillary: 88 mg/dL (ref 70–99)
Glucose-Capillary: 121 mg/dL — ABNORMAL HIGH (ref 70–99)
Glucose-Capillary: 301 mg/dL — ABNORMAL HIGH (ref 70–99)

## 2023-06-04 LAB — BPAM PLATELET PHERESIS
Blood Product Expiration Date: 202505042359
Blood Product Expiration Date: 202505062359
Blood Product Expiration Date: 202505062359
ISSUE DATE / TIME: 202505021226
ISSUE DATE / TIME: 202505031127
ISSUE DATE / TIME: 202505031448
Unit Type and Rh: 7300
Unit Type and Rh: 6200
Unit Type and Rh: 7300

## 2023-06-04 LAB — COMPREHENSIVE METABOLIC PANEL WITH GFR
ALT: 33 U/L (ref 0–44)
AST: 35 U/L (ref 15–41)
Albumin: 2.3 g/dL — ABNORMAL LOW (ref 3.5–5.0)
Alkaline Phosphatase: 147 U/L — ABNORMAL HIGH (ref 38–126)
Anion gap: 9 (ref 5–15)
BUN: 16 mg/dL (ref 8–23)
CO2: 18 mmol/L — ABNORMAL LOW (ref 22–32)
Calcium: 9.8 mg/dL (ref 8.9–10.3)
Chloride: 114 mmol/L — ABNORMAL HIGH (ref 98–111)
Creatinine, Ser: 0.87 mg/dL (ref 0.44–1.00)
GFR, Estimated: >60 mL/min (ref >60)
Glucose, Bld: 96 mg/dL (ref 70–99)
Potassium: 3.6 mmol/L (ref 3.5–5.1)
Sodium: 141 mmol/L (ref 135–145)
Total Bilirubin: 0.6 mg/dL (ref 0.0–1.2)
Total Protein: 4.9 g/dL — ABNORMAL LOW (ref 6.5–8.1)

## 2023-06-04 LAB — TYPE AND SCREEN
ABO/RH(D): A POS
Antibody Screen: NEGATIVE
Unit division: 0

## 2023-06-04 LAB — PREPARE PLATELET PHERESIS
Unit division: 0
Unit division: 0
Unit division: 0

## 2023-06-04 LAB — BPAM RBC
Blood Product Expiration Date: 202506032359
ISSUE DATE / TIME: 202505021612
Unit Type and Rh: 6200

## 2023-06-04 MED ORDER — METHYLPREDNISOLONE SODIUM SUCC 125 MG IJ SOLR
125.0000 mg | Freq: Two times a day (BID) | INTRAMUSCULAR | Status: DC
  Administered 2023-06-04 – 2023-06-05 (×2): 125 mg via INTRAVENOUS
  Filled 2023-06-04 (×2): qty 2

## 2023-06-04 MED ORDER — ORAL CARE MOUTH RINSE: 15.0000 mL | OROMUCOSAL | Status: DC | PRN

## 2023-06-04 MED ORDER — LOPERAMIDE HCL 2 MG PO CAPS: 2.0000 mg | ORAL_CAPSULE | Freq: Two times a day (BID) | ORAL | Status: DC | PRN

## 2023-06-04 MED ORDER — CHOLESTYRAMINE LIGHT 4 G PO PACK
4.0000 g | PACK | Freq: Two times a day (BID) | ORAL | Status: AC
Start: 2023-06-04 — End: 2023-06-07
  Administered 2023-06-04 – 2023-06-06 (×4): 4 g via ORAL
  Filled 2023-06-04 (×7): qty 1

## 2023-06-04 MED ORDER — SIMETHICONE 40 MG/0.6ML PO SUSP: 80.0000 mg | Freq: Once | ORAL | Status: DC

## 2023-06-04 NOTE — Plan of Care (Signed)

## 2023-06-04 NOTE — Progress Notes (Addendum)
 Amanda Crawford   DOB:12/01/1945   GE#:[id number]      ASSESSMENT & PLAN:  Acute diarrhea Colitis with + C. difficile -Status posttreatment for C. difficile earlier in March, presented with recurrence of diarrhea. - Positive C. difficile on 4/30, continue meds as ordered - Rectal tube intact, draining liquid stool  Small cell lung cancer, extensive stage History of renal cell carcinoma - Cytology of lung nodule confirmed metastatic carcinoma consistent with small cell carcinoma in patient with known history of RCC - Status post chemotherapy, last received 05/21/2023.  Dr. Maria Shiner will make further oncologic evaluation and treatment recommendations. - Medical oncology/Dr. Maria Shiner following  Pericolonic abscess - Patient has pericolonic abscess which was reviewed by IR and is not amenable to drainage at this time. - Will consider repeat CT scan next week - Surgery following  Severe thrombocytopenia - Platelets very low 27K today.  Status post platelet transfusions this admission - Transfuse platelets for counts <20 K or <50 K with bleeding.  Patient reports no bleeding and indeed no bleeding is noted at this time. - Sinew to monitor CBC with differential  Anemia - Hemoglobin low 8.9 - Recommend PRBC transfusion for hemoglobin <7.5 - Continue to monitor CBC with differential  Leukocytosis - Patient had neutropenia from chemotherapy.   -Status post G-CSF therapy.   -Continue to monitor CBC with differential  Hypertension Diabetes Hyperlipidemia Cirrhosis - Continue to monitor blood glucose levels. - Continue to monitor blood pressure. - Statin is on hold due to cirrhosis - Medical teams managing  Code Status Full  Subjective:  Patient seen awake alert and oriented x 3 laying on her left side.  Multiple family members at bedside.  Patient is very pleasant.  Appears slightly short of breath when she is talking however states she is comfortable laying as she is and did not want  the head of bed elevated.  Rectal tube and Foley cath intact.  Reports "discomfort" on her lower left quadrant of her abdomen where "the abscess is".  No other acute distress is noted.  Objective:  Vitals:   06/03/23 2001 06/04/23 0412  BP: 138/70 (!) 143/65  Pulse:    Resp: 17 18  Temp: 97.6 F (36.4 C) 97.7 F (36.5 C)  SpO2: 95% 94%     Intake/Output Summary (Last 24 hours) at 06/04/2023 1133 Last data filed at 06/04/2023 0600 Gross per 24 hour  Intake 340 ml  Output 3100 ml  Net -2760 ml     PHYSICAL EXAMINATION: ECOG PERFORMANCE STATUS: 3 - Symptomatic, >50% confined to bed  Vitals:   06/03/23 2001 06/04/23 0412  BP: 138/70 (!) 143/65  Pulse:    Resp: 17 18  Temp: 97.6 F (36.4 C) 97.7 F (36.5 C)  SpO2: 95% 94%   Filed Weights   05/29/23 0304 05/29/23 1416  Weight: 205 lb 14.6 oz (93.4 kg) 197 lb 8 oz (89.6 kg)    GENERAL: alert, no distress and comfortable SKIN: + Pale skin color, texture, turgor are normal, no rashes or significant lesions EYES: normal, conjunctiva are pink and non-injected, sclera clear OROPHARYNX: no exudate, no erythema and lips, buccal mucosa, and tongue normal  NECK: supple, thyroid  normal size, non-tender, without nodularity LYMPH: no palpable lymphadenopathy in the cervical, axillary or inguinal LUNGS: clear to auscultation and percussion with normal breathing effort HEART: regular rate & rhythm and no murmurs and no lower extremity edema ABDOMEN: + Obese abdomen soft, +tender and normal bowel sounds MUSCULOSKELETAL: no cyanosis of digits  and no clubbing  PSYCH: alert & oriented x 3 with fluent speech NEURO: no focal motor/sensory deficits   All questions were answered. The patient knows to call the clinic with any problems, questions or concerns.   The total time spent in the appointment was 40 minutes encounter with patient including review of chart and various tests results, discussions about plan of care and coordination of care  plan  [name], NP 06/04/2023 11:33 AM    Labs Reviewed:  Lab Results  Component Value Date   WBC 19.5 (H) 06/04/2023   HGB 8.9 (L) 06/04/2023   HCT 27.0 (L) 06/04/2023   MCV 100.0 06/04/2023   PLT 27 (LL) 06/04/2023   Recent Labs    06/02/23 0524 06/03/23 0546 06/04/23 0635  NA 139 141 141  K 3.4* 2.9* 3.6  CL 115* 114* 114*  CO2 14* 19* 18*  GLUCOSE 133* 71 96  BUN 27* 19 16  CREATININE 0.87 0.78 0.87  CALCIUM  9.6 9.2 9.8  GFRNONAA >60 >60 >60  PROT 4.7* 4.5* 4.9*  ALBUMIN 2.3* 2.1* 2.3*  AST 24 25 35  ALT 34 30 33  ALKPHOS 81 84 147*  BILITOT 0.6 0.5 0.6    Studies Reviewed:  CT ABDOMEN PELVIS W CONTRAST Result Date: 05/30/2023 CLINICAL DATA:  Abdominal pain, acute, nonlocalized. EXAM: CT ABDOMEN AND PELVIS WITH CONTRAST TECHNIQUE: Multidetector CT imaging of the abdomen and pelvis was performed using the standard protocol following bolus administration of intravenous contrast. RADIATION DOSE REDUCTION: This exam was performed according to the departmental dose-optimization program which includes automated exposure control, adjustment of the mA and/or kV according to patient size and/or use of iterative reconstruction technique. CONTRAST:  OMNIPAQUE  IOHEXOL  300 MG/ML  SOLN COMPARISON:  CT of the abdomen and pelvis 04/08/2023. FINDINGS: Lower chest: Mild dependent atelectasis is present in the right lower lobe. The lungs are otherwise clear. The heart size is normal. Coronary artery calcifications are present. No significant pleural or pericardial effusion is present. Hepatobiliary: No focal liver abnormality is seen. Status post cholecystectomy. No biliary dilatation. Pancreas: Minimal inflammatory changes about the head and tail the pancreas are less prominent than on the prior exam. No discrete lesions are present. Spleen: Normal in size without focal abnormality. Adrenals/Urinary Tract: The adrenal glands are normal bilaterally. Mild right-sided hydronephrosis  are present. Scarring at the lower pole of the right kidney is stable. The right ureter is dilated to the level of the right UVJ. No obstructing stone or mass lesion is evident. Stranding is noted about the distal ureter. Left nephrectomy is again noted. The urinary bladder is mildly distended. Stomach/Bowel: The stomach and duodenum are within normal limits. Small bowel is unremarkable. Terminal ileum is within normal limits. The appendix is absent. The ascending and transverse colon are within normal limits. The descending colon demonstrates some distal diverticular changes. Diffuse diverticular changes are present the sigmoid colon. Marked wall thickening is present proximally. A fluid collection adjacent to the inflamed sigmoid colon measures 4.4 x 5.6 x 3.5 cm. It abuts the left side of the urinary bladder. No free fluid is present. The distal sigmoid colon and rectum are within normal limits. Vascular/Lymphatic: Atherosclerotic calcifications are present in the aorta and branch vessels. No aneurysm is present. No significant adenopathy is present. Reproductive: Status post hysterectomy. No adnexal masses. Other: No abdominal wall hernia or abnormality. No abdominopelvic ascites. Musculoskeletal: Multilevel degenerative changes are again noted in the lumbar spine. Vacuum disc is present at L1-2,  L3-4, L4-5 and L5-S1. Right foraminal narrowing and bilateral scratched at right foraminal stenosis is present at L4-5. Bilateral foraminal narrowing is present at L5-S1. IMPRESSION: 1. Sigmoid diverticulitis. 2. 4.4 x 5.6 x 3.5 cm fluid collection adjacent to the inflamed sigmoid colon compatible with abscess. 3. Mild right-sided hydronephrosis and hydroureter to the level of the right UVJ. No obstructing stone or mass lesion is evident. This may represent a recently passed stone. 4. Left nephrectomy. 5. Multilevel degenerative changes in the lumbar spine. 6. Coronary artery disease. Electronically Signed   By:  Audree Leas M.D.   On: 05/30/2023 16:49   CT Angio Chest PE W and/or Wo Contrast Result Date: 05/29/2023 CLINICAL DATA:  Weakness and decreased appetite. Disorientation. Small-cell lung cancer. EXAM: CT ANGIOGRAPHY CHEST WITH CONTRAST TECHNIQUE: Multidetector CT imaging of the chest was performed using the standard protocol during bolus administration of intravenous contrast. Multiplanar CT image reconstructions and MIPs were obtained to evaluate the vascular anatomy. RADIATION DOSE REDUCTION: This exam was performed according to the departmental dose-optimization program which includes automated exposure control, adjustment of the mA and/or kV according to patient size and/or use of iterative reconstruction technique. CONTRAST:  60mL OMNIPAQUE  IOHEXOL  350 MG/ML SOLN COMPARISON:  Chest CT 04/08/2023 FINDINGS: Cardiovascular: The heart size is normal. No substantial pericardial effusion. Moderate atherosclerotic calcification is noted in the wall of the thoracic aorta. There is no filling defect within the opacified pulmonary arteries to suggest the presence of an acute pulmonary embolus. Mediastinum/Nodes: No mediastinal lymphadenopathy. There is no hilar lymphadenopathy. The esophagus has normal imaging features. There is no axillary lymphadenopathy. Lungs/Pleura: Retro hilar right lower lobe pulmonary lesion measured previously at 2.7 x 2.3 cm is 2.6 x 1.5 cm on image 63/303. Postsurgical changes noted in the region of the left hilum. No pleural effusion. Upper Abdomen: Nodular liver contour suggests cirrhosis. Musculoskeletal: No worrisome lytic or sclerotic osseous abnormality. Review of the MIP images confirms the above findings. IMPRESSION: 1. No CT evidence for acute pulmonary embolus. 2. Retro hilar right lower lobe pulmonary lesion measured previously at 2.7 x 2.3 cm is 2.6 x 1.5 cm. 3. Nodular liver contour suggests cirrhosis. 4.  Aortic Atherosclerosis (ICD10-I70.0). Electronically Signed    By: Donnal Fusi M.D.   On: 05/29/2023 08:15   DG Chest 1 View Result Date: 05/29/2023 CLINICAL DATA:  Altered mental status EXAM: CHEST  1 VIEW COMPARISON:  04/08/2023 FINDINGS: Lungs are clear. No pneumothorax or pleural effusion. Right internal jugular chest port tip is seen at the superior cavoatrial junction. Cardiac size within limits. Pulmonary vascularity is normal. IMPRESSION: 1. No active disease. Electronically Signed   By: Worthy Heads M.D.   On: 05/29/2023 03:44   CT Head Wo Contrast Result Date: 05/29/2023 EXAM: CT HEAD WITHOUT 05/29/2023 03:29:37 AM TECHNIQUE: CT of the head was performed without the administration of intravenous contrast. Automated exposure control, iterative reconstruction, and/or weight based adjustment of the mA/kV was utilized to reduce the radiation dose to as low as reasonably achievable. COMPARISON: 04/08/2023 CLINICAL HISTORY: Mental status change, unknown cause. Aphasia. Patient currently on chemotherapy. History of diabetes, lung cancer, migraine, renal cell cancer, and renal insufficiency. FINDINGS: BRAIN AND VENTRICLES: There is no acute intracranial hemorrhage, mass effect or midline shift. No abnormal extra-axial fluid collection. The gray-white differentiation is maintained without evidence of an acute infarct. There is no evidence of hydrocephalus. Mild scattered periventricular and subcortical low density, likely reflecting small vessel ischemic changes. ORBITS: The visualized portion of the orbits  demonstrate no acute abnormality. SINUSES: The visualized paranasal sinuses and mastoid air cells demonstrate no acute abnormality. SOFT TISSUES AND SKULL: No acute abnormality of the visualized skull or soft tissues. VASCULATURE: Intracranial atherosclerosis. IMPRESSION: 1. No acute intracranial abnormality. 2. Mild small vessel ischemic changes. Electronically signed by: Zadie Herter MD 05/29/2023 03:32 AM EDT RP Workstation: [id number]   NM PET Image  Restag (PS) Skull Base To Thigh Result Date: 05/17/2023 CLINICAL DATA:  Subsequent treatment strategy for small cell lung cancer. EXAM: NUCLEAR MEDICINE PET SKULL BASE TO THIGH TECHNIQUE: 10.2 mCi F-18 FDG was injected intravenously. Full-ring PET imaging was performed from the skull base to thigh after the radiotracer. CT data was obtained and used for attenuation correction and anatomic localization. Fasting blood glucose: 153 mg/dl COMPARISON:  82/95/6213 FINDINGS: Mediastinal blood pool activity: SUV max 3.5 Liver activity: SUV max NA NECK: No significant abnormal hypermetabolic activity in this region. Incidental CT findings: Bilateral common carotid atheromatous vascular calcification. CHEST: Most of the thoracic adenopathy has resolved. A remaining subcarinal lymph node measures 0.9 cm in diameter with maximum SUV 5.9. This previously measured 2.0 cm in diameter with maximum SUV 10.1. At the site of prior right infrahilar adenopathy, maximum SUV is currently 5.3 (previously 9.4 with previous 1.7 cm lymph node in this vicinity). Reduced size and activity in the right lower lobe nodule, currently density in this vicinity measures 1.7 by 2.3 cm with maximum SUV 4.9 (formerly 3.2 by 3.2 cm with maximum SUV 7.1). Incidental CT findings: Right Port-A-Cath tip: Right atrium. Coronary, aortic arch, and branch vessel atherosclerotic vascular disease. Mild cardiomegaly. Postoperative findings medially along the left upper lobe. ABDOMEN/PELVIS: Two foci of accentuated activity in the sigmoid colon probably correspond to mild diverticulitis for example on image 167 series 4 where there is signs of some local inflammation and associated activity with maximum SUV up to 8.3. Other areas of accentuated activity in bowel are likely physiologic. Incidental CT findings: Left oophorectomy. Partial right nephrectomy. Cholecystectomy. Small accessory spleen noted. Stable 1.8 by 1.2 cm hypodense lesion lateral to the spleen on  image 117 series 4 is not metabolic and highly likely to be benign/incidental. Sigmoid colon diverticulosis and mild diverticulitis. SKELETON: Heterogeneous uptake in the skeleton without a well-defined focal lesion healing lesion of the right fifth rib laterally with associated sclerosis, maximum SUV 3.3 and previously 6.5. Increased sclerosis in the left iliac bone lesion measuring 3.6 cm in long axis on image 148 series 4, maximum SUV 5.0 (formerly 7.3). This lesion no longer stands out against the background marrow activity. Incidental CT findings: Substantial posterior intervertebral spurring at the L1-2 level likely causing central narrowing of the thecal sac. IMPRESSION: 1. Most of the thoracic adenopathy has resolved, with reduced size and activity of the remaining lymph nodes. 2. Reduced size and activity in the right lower lobe nodule. 3. Increased sclerosis in the left iliac bone lesion, maximum SUV 5.0 (formerly 7.3). This lesion no longer stands out against the background marrow activity. 4. Healing lesion of the right fifth rib laterally with associated sclerosis, maximum SUV 3.3 and previously 6.5. 5. Two foci of accentuated activity in the sigmoid colon probably correspond to mild diverticulitis. 6. Coronary, aortic arch, and branch vessel atherosclerotic vascular disease. Mild cardiomegaly. Aortic Atherosclerosis (ICD10-I70.0). 7. Low-grade diffuse marrow activity, query granulocyte stimulation. 8. Interbody spurring at L1-2 with associated central narrowing of the thecal sac. Electronically Signed   By: Freida Jes M.D.   On: 05/17/2023 12:08  ADDENDUM: I saw and examined Ms. Curenton this morning.  She is still having diarrhea.  She has a C. difficile.  She has the diverticulitis.  I suppose 1 possibility might be diarrhea from the immunotherapy.  She is on a checkpoint inhibitor.  This can certainly happen.  Often times it happens much for often with the CTLA-4 inhibitors.  However,  given the fact that the diarrhea certainly is not improving that much, we could try her on high-dose steroids.  I will see about giving her Solu-Medrol.  We will see if this may be able to help control the diarrhea.  Another possibility might be using infliximab.  She does have the E. coli bacteremia.  This is sensitive to most antibiotics.  Her white cell counts come up quite nicely.  She was on Neupogen .  I am sure that the Neulasta  that she got the office is also now kicking in.  Today, her white cell count was 19.5.  Hemoglobin 8.9.  Platelet count 27,000.  This is gradually coming up.  This is very complicated.  I know that ID is also working hard on this.  I know that she has been seen by Surgery.  Her metabolic studies all look pretty decent.  Her BUN/creatinine is 16 and 0.87.  Calcium  9.8.  Albumin is 2.3.  This is so I can take a while.  His response to the steroids, we may have to hold the immunotherapy with her chemotherapy.   Rayleen Cal, MD  Psalm 41:3

## 2023-06-04 NOTE — Progress Notes (Signed)
 Subjective: CC: Having some llq pain with movement but none at rest. She will get R sided crampy abdominal pain after liquid intake. Has had prior cholecystectomy and appendectomy. No n/v. Continues to have liquid bm's w/ rectal tube in place.   Afebrile. HR low 100's. No hypotension. WBC 19.5 ([facility] reports they think this is 2/2 her receiving Neupogen  recently)  Objective: Vital signs in last 24 hours: Temp:  [97.6 F (36.4 C)-98.6 F (37 C)] 97.7 F (36.5 C) (05/05 0412) Pulse Rate:  [100-105] 101 (05/04 1311) Resp:  [17-26] 18 (05/05 0412) BP: (130-143)/(60-70) 143/65 (05/05 0412) SpO2:  [94 %-95 %] 94 % (05/05 0412) Last BM Date : 06/04/23  Intake/Output from previous day: 05/04 0701 - 05/05 0700 In: 1349.5 [P.O.:240; I.V.:609.5; IV Piggyback:500] Out: 4100 [Urine:1850; Stool:2250] Intake/Output this shift: No intake/output data recorded.  PE: Gen:  Alert, NAD, pleasant Abd: Soft, mild distension, NT, +BS  Lab Results:  Recent Labs    06/03/23 0546 06/04/23 0635  WBC 4.3 19.5*  HGB 8.1* 8.9*  HCT 24.5* 27.0*  PLT 20* 27*   BMET Recent Labs    06/03/23 0546 06/04/23 0635  NA 141 141  K 2.9* 3.6  CL 114* 114*  CO2 19* 18*  GLUCOSE 71 96  BUN 19 16  CREATININE 0.78 0.87  CALCIUM  9.2 9.8   PT/INR No results for input(s): "LABPROT", "INR" in the last 72 hours. CMP     Component Value Date/Time   NA 141 06/04/2023 0635   NA 139 08/23/2017 0933   NA 140 11/01/2016 0818   K 3.6 06/04/2023 0635   K 4.4 11/01/2016 0818   CL 114 (H) 06/04/2023 0635   CL 107 04/16/2012 0923   CO2 18 (L) 06/04/2023 0635   CO2 26 11/01/2016 0818   GLUCOSE 96 06/04/2023 0635   GLUCOSE 141 (H) 11/01/2016 0818   GLUCOSE 115 (H) 04/16/2012 0923   BUN 16 06/04/2023 0635   BUN 24 08/23/2017 0933   BUN 17.9 11/01/2016 0818   CREATININE 0.87 06/04/2023 0635   CREATININE 1.59 (H) 05/21/2023 1003   CREATININE 1.1 11/01/2016 0818   CALCIUM  9.8 06/04/2023 0635   CALCIUM   10.3 11/01/2016 0818   PROT 4.9 (L) 06/04/2023 0635   PROT 7.8 11/01/2016 0818   ALBUMIN 2.3 (L) 06/04/2023 0635   ALBUMIN 3.8 11/01/2016 0818   AST 35 06/04/2023 0635   AST 48 (H) 05/21/2023 1003   AST 75 (H) 11/01/2016 0818   ALT 33 06/04/2023 0635   ALT 34 05/21/2023 1003   ALT 75 (H) 11/01/2016 0818   ALKPHOS 147 (H) 06/04/2023 0635   ALKPHOS 72 11/01/2016 0818   BILITOT 0.6 06/04/2023 0635   BILITOT 0.7 05/21/2023 1003   BILITOT 0.78 11/01/2016 0818   GFRNONAA >60 06/04/2023 0635   GFRNONAA 33 (L) 05/21/2023 1003   GFRAA 44 (L) 06/05/2019 0933   Lipase     Component Value Date/Time   LIPASE 67 (H) 05/29/2023 0303    Studies/Results: No results found.  Anti-infectives: Anti-infectives (From admission, onward)    Start     Dose/Rate Route Frequency Ordered Stop   05/31/23 1600  vancomycin  (VANCOREADY) IVPB 1250 mg/250 mL  Status:  Discontinued        1,250 mg 166.7 mL/hr over 90 Minutes Intravenous Every 48 hours 05/29/23 1437 05/30/23 1416   05/31/23 1445  fidaxomicin  (DIFICID ) tablet 200 mg        200 mg Oral 2  times daily 05/31/23 1353 06/10/23 0959   05/31/23 1345  vancomycin  (VANCOCIN ) capsule 125 mg  Status:  Discontinued        125 mg Oral Daily 05/31/23 1253 05/31/23 1353   05/31/23 1000  fidaxomicin  (DIFICID ) tablet 200 mg  Status:  Discontinued        200 mg Oral 2 times daily 05/31/23 0812 05/31/23 0813   05/31/23 1000  metroNIDAZOLE  (FLAGYL ) IVPB 500 mg        500 mg 100 mL/hr over 60 Minutes Intravenous Every 12 hours 05/31/23 0813     05/29/23 2300  cefTRIAXone  (ROCEPHIN ) 2 g in sodium chloride  0.9 % 100 mL IVPB        2 g 200 mL/hr over 30 Minutes Intravenous Every 24 hours 05/29/23 2211     05/29/23 1700  ceFEPIme  (MAXIPIME ) 2 g in sodium chloride  0.9 % 100 mL IVPB  Status:  Discontinued        2 g 200 mL/hr over 30 Minutes Intravenous Every 12 hours 05/29/23 1437 05/29/23 2211   05/29/23 1600  metroNIDAZOLE  (FLAGYL ) IVPB 500 mg  Status:   Discontinued        500 mg 100 mL/hr over 60 Minutes Intravenous Every 12 hours 05/29/23 1511 05/31/23 0812   05/29/23 1500  vancomycin  (VANCOREADY) IVPB 2000 mg/400 mL        2,000 mg 200 mL/hr over 120 Minutes Intravenous  Once 05/29/23 1437 05/29/23 1724   05/29/23 0430  ceFEPIme  (MAXIPIME ) 2 g in sodium chloride  0.9 % 100 mL IVPB        2 g 200 mL/hr over 30 Minutes Intravenous  Once 05/29/23 0416 05/29/23 0523        Assessment/Plan Diverticulitis with abscess and recurrent/persistent C. difficile colitis  - CT w/ pericolonic abscess that was reviewed by IR and is not amenable to drainage at present.; consider repeat CT this week - Abx per ID - Okay for FLD, adv as tolerated.  - No emergent surgical needs - We will follow with you   FEN - FLD; advance diet as tolerated VTE - SCDs, chem ppx on hold due to significant thrombocytopenia ID - Rocephin /Flagyl /Dificid  per ID   Pancytopenia likely secondary to chemotherapy - afebrile, leukopenia/thrombocytopenia - platelet transfusion today per Oncology E. coli bacteremia Cirrhosis Small cell lung cancer, currently on chemotherapy with metastatic disease Diabetes mellitus GERD History of renal cell cancer, status post left nephrectomy Hypertension Obesity Chronic kidney disease Hyperlipidemia  I reviewed nursing notes, hospitalist notes, last 24 h vitals and pain scores, last 48 h intake and output, last 24 h labs and trends, and last 24 h imaging results.   LOS: 6 days    Delton Filbert, [facility] Surgery 06/04/2023, 11:45 AM Please see Amion for pager number during day hours 7:00am-4:30pm

## 2023-06-04 NOTE — Progress Notes (Signed)
 No acute changes overnight, patient continues with diarrhea, foley catheter in place, patent draining amber urine, no foul odor, denies nausea or pain, able to eat small meals. Fluids encouraged and tolerated well

## 2023-06-04 NOTE — Progress Notes (Signed)
 PROGRESS NOTE  Amanda Crawford  DOB: 03/16/1945  PCP: Neda Balk, MD WUJ:[id number]  DOA: 05/29/2023  LOS: 6 days  Hospital Day: 7  Brief narrative: Amanda Crawford is a 78 y.o. female with PMH significant for small cell lung cancer, prior renal cell cancer with lung mets, DM2, CKD, recently hospitalized in March for septic shock diverticulitis and C. difficile infection, discharged on oral Augmentin  and oral vancomycin . With the completion of antibiotics, patient's symptoms improved but few days ago, started again 4/29, patient presented to the ED with complaint of persistent watery diarrhea weakness, poor oral intake, confusion.  Initial labs with WBC count significantly low at 0.4, hemoglobin 10.9, platelet 48, sodium 130, BUN/creatinine 46/1.35, troponin elevated to 170 Chest x-ray, CT head unremarkable for acute pathology CT angio chest did not show any evidence of pulm embolism, showed right lower lobe mass, liver cirrhosis. Started on broad-spectrum antibiotics for neutropenia  Admitted to [facility] Blood culture sent on admission is growing E. coli, pending sensitivity 4/30, C. difficile PCR positive 4/30, CT abdomen and pelvis showed sigmoid diverticulitis, 3.5 X 4.4 X 5.6 cm abscess.  Subjective: Patient was seen and examined this morning. Lying on bed.  Looks weak.  Continues to have diarrhea on Flexi-Seal. No fever.  WBC count better with Neupogen  Abdominal tenderness improving. Family at bedside  Assessment and plan: Sepsis POA Sigmoid diverticulitis and abscess E. coli bacteremia Immunocompromise status ID, general surgery and IR were consulted  Per IR, abscess is not amenable to percutaneous drainage. Per ID recommendation, patient is currently on IV Rocephin , and IV Flagyl . Neutropenic precautions to continue Recent Labs  Lab 05/29/23 0316 05/31/23 0400 06/01/23 0618 06/02/23 0524 06/03/23 0546 06/04/23 0635  WBC  --  0.4* 0.8* 2.1* 4.3 19.5*   LATICACIDVEN 1.7  --   --   --   --   --    Acute diarrhea  C. difficile colitis Patient had recently completed treatment for C. difficile in March with oral vancomycin  Presented with recurrence of diarrhea, poor oral intake 4/30, C. difficile PCR and antigen positive. Currently on Dificid .  Continues to have loose bowel movement. Flexi-Seal in place with liquid stool There is also concern that she could be having diarrhea due to autoimmune colitis from cancer treatment especially because C. difficile toxin is negative. Discussed the case with ID Dr. Shereen Dike and oncologist Dr. Maria Shiner this morning. Will start the patient on Questran 4 g twice daily scheduled and Imodium 2 g twice daily as needed  Acute on chronic anemia  Patient has chronic macrocytic anemia related to cancer  Hemoglobin improved after 1 unit PRBC but hemoglobin downtrending again.  No active bleeding. Continue iron supplement Was also on aspirin , currently on hold Recent Labs    09/07/22 1110 10/12/22 1036 02/08/23 1110 03/14/23 1116 05/31/23 0400 06/01/23 0618 06/02/23 0524 06/03/23 0546 06/04/23 0635  HGB  --    < > 13.0   < > 8.3* 7.6* 8.8* 8.1* 8.9*  MCV  --    < > 101.7*   < > 99.2 101.7* 102.2* 99.2 100.0  VITAMINB12 612  --  784  --   --   --   --   --   --    < > = values in this interval not displayed.    Severe thrombocytopenia Platelet level significantly low, thankfully no bleeding. 2 units of platelet transfused so far.  Platelet improved to 27 Continue SCDs for DVT prophylaxis.   Recent Labs  Lab 05/29/23 0303 05/31/23 0400 06/01/23 0618 06/02/23 0524 06/03/23 0546 06/04/23 0635  PLT 48* 15* 7* 9* 20* 27*    Extensive stage small cell lung cancer on chemotherapy Under the care of Dr. Maria Shiner Per oncology note, she had 3 cycles of chemotherapy, last on 05/21/2023.    Neutropenia Due to chemotherapy.  Neupogen  added by oncology.  WBC count rising up gradually. Neutropenic precautions.     Type 2 diabetes mellitus A1c 7.6 on 02/08/2023 PTA meds-Semglee  nightly.  No longer on glimepiride  because of hypoglycemia episodes recently Currently not on scheduled insulin , continue SSI/Accu-Cheks Recent Labs  Lab 06/03/23 1743 06/03/23 1957 06/03/23 2213 06/04/23 0742 06/04/23 1247  GLUCAP 159* 165* 164* 89 88   CKD 3B Acute metabolic acidosis Renal function at baseline.  Continue to monitor BUN improving.  Serum bicarb level was low. On sodium bicarb drip.  Given the severity of diarrhea, I would continue bicarb drip today.  Recent Labs    04/12/23 0333 04/16/23 1321 04/30/23 0910 05/21/23 1003 05/29/23 0303 05/31/23 0400 06/01/23 0618 06/02/23 0524 06/03/23 0546 06/04/23 0635  BUN 20 15 22  28* 46* 36* 34* 27* 19 16  CREATININE 1.33* 1.24* 1.53* 1.59* 1.35* 1.00 0.90 0.87 0.78 0.87  CO2 18* 26 26 25  19* 18* 17* 14* 19* 18*   Hypertension PTA meds- Coreg  half tab of 3.125 mg twice daily Coreg  currently on hold due to low blood pressure, poor oral intake and diarrhea.  Liver cirrhosis new finding on CT scan during her last hospital stay, will need outpatient GI follow-up for this   Hyperlipidemia statin on hold due to liver cirrhosis.   Mobility: Impaired mobility due to chemo.  Continues to need PT  Goals of care   Code Status: Full Code     DVT prophylaxis:  Place and maintain sequential compression device Start: 05/31/23 0835   Antimicrobials: IV Rocephin , IV Flagyl , and Dificid  Fluid: Sodium bicarb drip Consultants: Oncology, ID Family Communication: Family not at at bedside  Status: Inpatient Level of care:  Telemetry Medical   Patient is from: Home Needs to continue in-hospital care: Ongoing workup for diarrhea, diverticulitis, abscess Anticipated d/c to: Pending clinical course   Diet:  Diet Order             Diet full liquid Fluid consistency: Thin  Diet effective now                   Scheduled Meds:  Chlorhexidine   Gluconate Cloth  6 each Topical Daily   dicyclomine   10 mg Oral TID AC & HS   fidaxomicin   200 mg Oral BID   Gerhardt's butt cream   Topical TID   insulin  aspart  0-15 Units Subcutaneous TID WC   insulin  aspart  0-5 Units Subcutaneous QHS   insulin  glargine-yfgn  14 Units Subcutaneous QHS   methylPREDNISolone (SOLU-MEDROL) injection  125 mg Intravenous Q12H   simethicone   80 mg Oral QID   sodium chloride  flush  10-40 mL Intracatheter Q12H    PRN meds: acetaminophen  **OR** [DISCONTINUED] acetaminophen , albuterol , dicyclomine , morphine  injection, ondansetron  **OR** ondansetron  (ZOFRAN ) IV, mouth rinse, sodium chloride  flush   Infusions:   cefTRIAXone  (ROCEPHIN )  IV Stopped (06/04/23 0600)   metronidazole  500 mg (06/04/23 0955)    Antimicrobials: Anti-infectives (From admission, onward)    Start     Dose/Rate Route Frequency Ordered Stop   05/31/23 1600  vancomycin  (VANCOREADY) IVPB 1250 mg/250 mL  Status:  Discontinued  1,250 mg 166.7 mL/hr over 90 Minutes Intravenous Every 48 hours 05/29/23 1437 05/30/23 1416   05/31/23 1445  fidaxomicin  (DIFICID ) tablet 200 mg        200 mg Oral 2 times daily 05/31/23 1353 06/10/23 0959   05/31/23 1345  vancomycin  (VANCOCIN ) capsule 125 mg  Status:  Discontinued        125 mg Oral Daily 05/31/23 1253 05/31/23 1353   05/31/23 1000  fidaxomicin  (DIFICID ) tablet 200 mg  Status:  Discontinued        200 mg Oral 2 times daily 05/31/23 0812 05/31/23 0813   05/31/23 1000  metroNIDAZOLE  (FLAGYL ) IVPB 500 mg        500 mg 100 mL/hr over 60 Minutes Intravenous Every 12 hours 05/31/23 0813     05/29/23 2300  cefTRIAXone  (ROCEPHIN ) 2 g in sodium chloride  0.9 % 100 mL IVPB        2 g 200 mL/hr over 30 Minutes Intravenous Every 24 hours 05/29/23 2211     05/29/23 1700  ceFEPIme  (MAXIPIME ) 2 g in sodium chloride  0.9 % 100 mL IVPB  Status:  Discontinued        2 g 200 mL/hr over 30 Minutes Intravenous Every 12 hours 05/29/23 1437 05/29/23 2211    05/29/23 1600  metroNIDAZOLE  (FLAGYL ) IVPB 500 mg  Status:  Discontinued        500 mg 100 mL/hr over 60 Minutes Intravenous Every 12 hours 05/29/23 1511 05/31/23 0812   05/29/23 1500  vancomycin  (VANCOREADY) IVPB 2000 mg/400 mL        2,000 mg 200 mL/hr over 120 Minutes Intravenous  Once 05/29/23 1437 05/29/23 1724   05/29/23 0430  ceFEPIme  (MAXIPIME ) 2 g in sodium chloride  0.9 % 100 mL IVPB        2 g 200 mL/hr over 30 Minutes Intravenous  Once 05/29/23 0416 05/29/23 0523       Objective: Vitals:   06/03/23 2001 06/04/23 0412  BP: 138/70 (!) 143/65  Pulse:    Resp: 17 18  Temp: 97.6 F (36.4 C) 97.7 F (36.5 C)  SpO2: 95% 94%    Intake/Output Summary (Last 24 hours) at 06/04/2023 1350 Last data filed at 06/04/2023 0600 Gross per 24 hour  Intake 340 ml  Output 3100 ml  Net -2760 ml   Filed Weights   05/29/23 0304 05/29/23 1416  Weight: 93.4 kg 89.6 kg   Weight change:  Body mass index is 33.9 kg/m.   Physical Exam: General exam: Very pleasant, elderly Caucasian female.  Not in pain Skin: No rashes, lesions or ulcers. HEENT: Atraumatic, normocephalic, no obvious bleeding Lungs: Clear to auscultation bilaterally CVS: S1, S2, no murmur,   GI/Abd: Soft, mild tenderness remains in lower abdomen, nondistended, bowel sound present,   CNS: Alert, awake, oriented x 3.  Slow to respond Psychiatry: Frustrated Extremities: No pedal edema, no calf tenderness  Data Review: I have personally reviewed the laboratory data and studies available.  F/u labs ordered Unresulted Labs (From admission, onward)     Start     Ordered   06/02/23 0741  Prepare platelet pheresis  (Blood Administration Adult)  Once,   R       Question Answer Comment  Number of Apheresis Units (1 unit of apheresis platelets will increase platelets 30,000/mL in an avg sized adult) 1 unit   Transfusion Indications Plt = 10,000   Date/Time blood product needed For transfusion   If emergent release call blood  bank Not  emergent release      06/02/23 0741            Signed, Hoyt Macleod, MD [facility] 06/04/2023

## 2023-06-04 NOTE — Progress Notes (Signed)
 [facility]  Date of Admission:  05/29/2023   Total days of inpatient antibiotics 4  Principal Problem:   Sepsis (HCC) Active Problems:   Neutropenia (HCC)   Enteritis due to Clostridium difficile          Assessment: 78 yo female with small cell lung cancer (mets to hip), hx [facility] pulm mets (left nephrectomy 2001), dm2, ckd3, dm2, diverticulitis 03/2023 admission finished augmentin  course complicated by cdiff at that time s/p 2 weeks po vanc, cirrhosis (imaging finding 03/2023), on chemo (last dose/cycle on 05/21/23), admitted with neutropenic fever, found to have ecoli bsi and intraabd abscess, along with new onset severe diarrhea     #cdiff Has always just had cdiff pcr not toxin positivity Hx does however in march and also this admission suggestive of cdiff infection Will trial fidaxamycin tapered course for recurrent cdiff; anticipate to be around 5 weeks. This should hopefully cover secondary prophylaxis duration for the systemic abx required for intraabd abscess   #diverticulitis #intraabd abscess #ecoli bsi #neutropenic fever Iv abx-->augmentin  short course finished 03/3023 Still imaging finding diverticulitis and abscess Abd pain all over ?cdiff related Suspect source for ecoli bsi is abscess.    One question remains is if the port has been seeded. Would not be able to tell. However reassuring that her sepsis had resolved. Plan to repeat bcx and if negative to treat for bsi/intraabd abscess. If recurrent ecoli bsi after diverticulitis/abscess resolve then port needs to be removed   Discussed with id pharmacy [facility]. Augmentin  mic on the higher side for ecoli but still sensitive. Will treat for 2 weeks if iv ceftriaxone  (will cover duration of bsi) and then transition to long augmentin  course (3-4 more weeks)    Will need secondary prophylaxis cdiff coverage while on systemic abx   5/5 id assessment Rectal tube remains; still  significant diarrhea Abd pain some improvement Wbc improving  has had granulocyte stimulating factor and she is quite removed from last chemo round  Discussed with Dr Gwynneth Lessen of [facility] and Dr Bolling Bushy. Potential check point related colitis and trial of steroid. GI input regarding diagnostic colonscopy perhaps could add benefit but diverticulitis and thrombocytopenia   Plan: -for ease of abx administration and again given the immunosuppressed status and borderline mic with amox-clav, will plan 4 weeks with ceftriaxone  iv and po metronidazole   -continue dificid  -- patient doesn't have coverage with dificid  to go for the duration of systemic abx and so will need to transition to oral vancomycin  to daily dose for the duration 1 week beyond systemic abx and perhaps another 2 weeks of pulse every other day dosing -dr Bolling Bushy will place steroid for potential check point inhibitor associated colitis -trial of fiber/touch of immodium -Maintain enteric isolation precaution for the duration of admission    Microbiology:   Antibiotics: 5/1-c fidaxomycin 4/29-c ceftriaxone                                                       4/29-c metronidazole      SUBJECTIVE: Abdominal pain improved Maybe more energy  Interval:   Review of Systems: Review of Systems  All other systems reviewed and are negative.    Scheduled Meds:  Chlorhexidine  Gluconate Cloth  6 each Topical Daily   dicyclomine   10 mg Oral TID AC & HS   fidaxomicin   200 mg Oral BID   Gerhardt's butt cream   Topical TID   insulin  aspart  0-15 Units Subcutaneous TID WC   insulin  aspart  0-5 Units Subcutaneous QHS   insulin  glargine-yfgn  14 Units Subcutaneous QHS   simethicone   80 mg Oral QID   sodium chloride  flush  10-40 mL Intracatheter Q12H   Continuous Infusions:  cefTRIAXone  (ROCEPHIN )  IV Stopped (06/04/23 0600)   metronidazole  500 mg (06/04/23 0955)   PRN Meds:.acetaminophen  **OR** [DISCONTINUED] acetaminophen , albuterol ,  dicyclomine , morphine  injection, ondansetron  **OR** ondansetron  (ZOFRAN ) IV, mouth rinse, sodium chloride  flush Allergies  Allergen Reactions   Culturelle Probiotics [Lactobacillus]     Patient should not take probiotics while immunocompromised from chemo    Bactrim  Ds [Sulfamethoxazole -Trimethoprim ] Rash    OBJECTIVE: Vitals:   06/03/23 1232 06/03/23 1311 06/03/23 2001 06/04/23 0412  BP: 131/60 134/62 138/70 (!) 143/65  Pulse: 100 (!) 101    Resp: (!) 21 20 17 18   Temp: 98.6 F (37 C) 98.3 F (36.8 C) 97.6 F (36.4 C) 97.7 F (36.5 C)  TempSrc:  Oral Oral Oral  SpO2: 95% 95% 95% 94%  Weight:      Height:       Body mass index is 33.9 kg/m.  Physical Exam Constitutional:      Appearance: Normal appearance.  HENT:     Head: Normocephalic and atraumatic.     Right Ear: Tympanic membrane normal.     Left Ear: Tympanic membrane normal.     Nose: Nose normal.     Mouth/Throat:     Mouth: Mucous membranes are moist.  Eyes:     Extraocular Movements: Extraocular movements intact.     Conjunctiva/sclera: Conjunctivae normal.     Pupils: Pupils are equal, round, and reactive to light.  Cardiovascular:     Rate and Rhythm: Normal rate and regular rhythm.     Heart sounds: No murmur heard.    No friction rub. No gallop.  Pulmonary:     Effort: Pulmonary effort is normal.     Breath sounds: Normal breath sounds.  Abdominal:     General: Abdomen is flat.     Palpations: Abdomen is soft.  Musculoskeletal:        General: Normal range of motion.  Skin:    General: Skin is warm and dry.  Neurological:     General: No focal deficit present.     Mental Status: She is alert and oriented to person, place, and time.  Psychiatric:        Mood and Affect: Mood normal.    06/04/23 abd exam Soft, mild tender llq (improved tenderness from last week)   Lab Results Lab Results  Component Value Date   WBC 19.5 (H) 06/04/2023   HGB 8.9 (L) 06/04/2023   HCT 27.0 (L) 06/04/2023    MCV 100.0 06/04/2023   PLT 27 (LL) 06/04/2023    Lab Results  Component Value Date   CREATININE 0.87 06/04/2023   BUN 16 06/04/2023   NA 141 06/04/2023   K 3.6 06/04/2023   CL 114 (H) 06/04/2023   CO2 18 (L) 06/04/2023    Lab Results  Component Value Date   ALT 33 06/04/2023   AST 35 06/04/2023   ALKPHOS 147 (H) 06/04/2023   BILITOT 0.6 06/04/2023     Imaging: Reviewed   4/30 ct abd pelv with contrast 1. Sigmoid diverticulitis. 2. 4.4 x 5.6  x 3.5 cm fluid collection adjacent to the inflamed sigmoid colon compatible with abscess. 3. Mild right-sided hydronephrosis and hydroureter to the level of the right UVJ. No obstructing stone or mass lesion is evident. This may represent a recently passed stone. 4. Left nephrectomy. 5. Multilevel degenerative changes in the lumbar spine. 6. Coronary artery disease      Jamesetta Mcbride, MD [facility] 06/04/2023, 12:54 PM

## 2023-06-04 NOTE — Progress Notes (Signed)
 Occupational Therapy Treatment Patient Details Name: Amanda Crawford MRN: [id number] DOB: 05-31-1945 Today's Date: 06/04/2023   History of present illness Amanda Crawford is a 78 y.o. female admitted with sepsis; 4/30 C. difficile PCR and antigen positive. PMH significant for small cell lung cancer, prior renal cell cancer with lung mets, DM2, CKD, recently hospitalized in March for septic shock diverticulitis and C. difficile infection   OT comments  Patient een for skilled OT session with husband and family bedside for duration. Patient deferred OOB this session as rectal tue seal released last OOB session and patient fears BM issues and reported feeling "exhausted" when it occurred. See below for ADL and therex activity EOB with education on energy conservation and breathing integration. OT will continue to follow acutely to progress functional performance. Based on continued progress, OT recommending hoe with caregiver support and assistance and HHOT services.       If plan is discharge home, recommend the following:  A lot of help with walking and/or transfers;A lot of help with bathing/dressing/bathroom;Assistance with cooking/housework;Assistance with feeding;Direct supervision/assist for medications management;Direct supervision/assist for financial management;Assist for transportation;Help with stairs or ramp for entrance   Equipment Recommendations  None recommended by OT       Precautions / Restrictions Precautions Precautions: Fall Recall of Precautions/Restrictions: Intact Precaution/Restrictions Comments: flexiseal Restrictions Weight Bearing Restrictions Per Provider Order: No       Mobility Bed Mobility Overal bed mobility: Needs Assistance Bed Mobility: Supine to Sit Rolling: Supervision, Used rails   Supine to sit: HOB elevated, Used rails, Supervision Sit to supine: Contact guard assist, HOB elevated, Used rails   General bed mobility comments: OT assisted with  lines management    Transfers Overall transfer level: Needs assistance (declined oob- see above) Equipment used: Rolling walker (2 wheels) Transfers: Sit to/from Stand Sit to Stand: Contact guard assist                 Balance Overall balance assessment: Needs assistance Sitting-balance support: Feet supported Sitting balance-Leahy Scale: Fair                                     ADL either performed or assessed with clinical judgement   ADL Overall ADL's : Needs assistance/impaired Eating/Feeding: Set up   Grooming: Set up;Sitting   Upper Body Bathing: Sitting;Contact guard assist   Lower Body Bathing: Maximal assistance;Sitting/lateral leans;Sit to/from stand;Bed level   Upper Body Dressing : Minimal assistance;Sitting   Lower Body Dressing: Maximal assistance;Sitting/lateral leans;Sit to/from stand Lower Body Dressing Details (indicate cue type and reason): rectal tube impedes function as patietn fearful of seal dislodging Toilet Transfer:  (EOB due to fear rectal tube will break seal as did after last oob session)           Functional mobility during ADLs:  (EOB this session due to fear of seal disruption as she was exhausted yesterday with BM issues) General ADL Comments: fatigues easily, DOE with 93% O2 sats on RA    Extremity/Trunk Assessment Upper Extremity Assessment Upper Extremity Assessment: Generalized weakness;Right hand dominant   Lower Extremity Assessment Lower Extremity Assessment: Generalized weakness        Vision   Vision Assessment?: No apparent visual deficits;Wears glasses for reading   Perception Perception Perception: Within Functional Limits   Praxis Praxis Praxis: [facility]   Communication Communication Communication: No apparent difficulties   Cognition Arousal: Alert Behavior  During Therapy: WFL for tasks assessed/performed Cognition: No apparent impairments                                Following commands: Intact        Cueing   Cueing Techniques: Verbal cues  Exercises Exercises: General Upper Extremity General Exercises - Upper Extremity Shoulder Flexion: Other (comment) (B scap retraction, elevation and B hand therex with foam cub (medium resist) or grasp, pinch and chest press B palms all 10 reps x 2 sets)    Shoulder Instructions       General Comments remained SOB with activity but sats 94-97% on RA    Pertinent Vitals/ Pain       Pain Assessment Pain Assessment: Faces Faces Pain Scale: Hurts a little bit Pain Location: buttocks Pain Descriptors / Indicators: Discomfort Pain Intervention(s): Limited activity within patient's tolerance, Monitored during session, Repositioned   Frequency  Min 2X/week        Progress Toward Goals  OT Goals(current goals can now be found in the care plan section)  Progress towards OT goals: Progressing toward goals  Acute Rehab OT Goals OT Goal Formulation: With patient Time For Goal Achievement: 06/16/23 Potential to Achieve Goals: Good ADL Goals Pt Will Perform Lower Body Bathing: with contact guard assist;sitting/lateral leans;sit to/from stand;with adaptive equipment Pt Will Perform Lower Body Dressing: with min assist;sitting/lateral leans;sit to/from stand;with adaptive equipment Pt Will Transfer to Toilet: bedside commode;ambulating;grab bars;with contact guard assist Pt/caregiver will Perform Home Exercise Program: Increased strength;With written HEP provided;With Supervision Additional ADL Goal #1: Patient will teach back 4/4 ECTs for ADL's and mobility with min cues   AM-PAC OT "6 Clicks" Daily Activity     Outcome Measure   Help from another person eating meals?: A Little Help from another person taking care of personal grooming?: A Little Help from another person toileting, which includes using toliet, bedpan, or urinal?: A Little Help from another person bathing (including washing, rinsing,  drying)?: A Lot Help from another person to put on and taking off regular upper body clothing?: A Little Help from another person to put on and taking off regular lower body clothing?: A Lot 6 Click Score: 16    End of Session Equipment Utilized During Treatment: Gait belt;Rolling walker (2 wheels)  OT Visit Diagnosis: Unsteadiness on feet (R26.81);Muscle weakness (generalized) (M62.81)   Activity Tolerance Patient limited by fatigue;Treatment limited secondary to agitation   Patient Left in bed;with bed alarm set   Nurse Communication Mobility status        Time: 9811-9147 OT Time Calculation (min): 22 min  Charges: OT General Charges $OT Visit: 1 Visit OT Treatments $Therapeutic Activity: 8-22 mins  Jay Haskew OT/L Acute Rehabilitation Department  [phone]  06/04/2023, 3:27 PM

## 2023-06-05 ENCOUNTER — Inpatient Hospital Stay (HOSPITAL_COMMUNITY)

## 2023-06-05 DIAGNOSIS — A0472 Enterocolitis due to Clostridium difficile, not specified as recurrent: Secondary | ICD-10-CM | POA: Diagnosis not present

## 2023-06-05 DIAGNOSIS — A4151 Sepsis due to Escherichia coli [E. coli]: Secondary | ICD-10-CM | POA: Diagnosis not present

## 2023-06-05 DIAGNOSIS — K651 Peritoneal abscess: Secondary | ICD-10-CM | POA: Diagnosis not present

## 2023-06-05 DIAGNOSIS — D709 Neutropenia, unspecified: Secondary | ICD-10-CM | POA: Diagnosis not present

## 2023-06-05 DIAGNOSIS — K5792 Diverticulitis of intestine, part unspecified, without perforation or abscess without bleeding: Secondary | ICD-10-CM | POA: Diagnosis not present

## 2023-06-05 LAB — CULTURE, BLOOD (ROUTINE X 2)
Culture: NO GROWTH
Culture: NO GROWTH

## 2023-06-05 LAB — CBC WITH DIFFERENTIAL/PLATELET
Abs Immature Granulocytes: 3.86 10*3/uL — ABNORMAL HIGH (ref 0.00–0.07)
Basophils Absolute: 0 10*3/uL (ref 0.0–0.1)
Basophils Relative: 0 %
Eosinophils Absolute: 0 10*3/uL (ref 0.0–0.5)
Eosinophils Relative: 0 %
HCT: 28.9 % — ABNORMAL LOW (ref 36.0–46.0)
Hemoglobin: 9.3 g/dL — ABNORMAL LOW (ref 12.0–15.0)
Immature Granulocytes: 14 %
Lymphocytes Relative: 6 %
Lymphs Abs: 1.6 10*3/uL (ref 0.7–4.0)
MCH: 32.4 pg (ref 26.0–34.0)
MCHC: 32.2 g/dL (ref 30.0–36.0)
MCV: 100.7 fL — ABNORMAL HIGH (ref 80.0–100.0)
Monocytes Absolute: 1.3 10*3/uL — ABNORMAL HIGH (ref 0.1–1.0)
Monocytes Relative: 5 %
Neutro Abs: 21.3 10*3/uL — ABNORMAL HIGH (ref 1.7–7.7)
Neutrophils Relative %: 75 %
Platelets: 33 10*3/uL — ABNORMAL LOW (ref 150–400)
RBC: 2.87 MIL/uL — ABNORMAL LOW (ref 3.87–5.11)
RDW: 17.6 % — ABNORMAL HIGH (ref 11.5–15.5)
WBC Morphology: INCREASED
WBC: 28.1 10*3/uL — ABNORMAL HIGH (ref 4.0–10.5)
nRBC: 0.4 % — ABNORMAL HIGH (ref 0.0–0.2)

## 2023-06-05 LAB — COMPREHENSIVE METABOLIC PANEL WITH GFR
ALT: 33 U/L (ref 0–44)
AST: 27 U/L (ref 15–41)
Albumin: 2.3 g/dL — ABNORMAL LOW (ref 3.5–5.0)
Alkaline Phosphatase: 189 U/L — ABNORMAL HIGH (ref 38–126)
Anion gap: 5 (ref 5–15)
BUN: 19 mg/dL (ref 8–23)
CO2: 20 mmol/L — ABNORMAL LOW (ref 22–32)
Calcium: 10.1 mg/dL (ref 8.9–10.3)
Chloride: 113 mmol/L — ABNORMAL HIGH (ref 98–111)
Creatinine, Ser: 0.9 mg/dL (ref 0.44–1.00)
GFR, Estimated: >60 mL/min (ref >60)
Glucose, Bld: 195 mg/dL — ABNORMAL HIGH (ref 70–99)
Potassium: 4 mmol/L (ref 3.5–5.1)
Sodium: 138 mmol/L (ref 135–145)
Total Bilirubin: 0.7 mg/dL (ref 0.0–1.2)
Total Protein: 5 g/dL — ABNORMAL LOW (ref 6.5–8.1)

## 2023-06-05 LAB — GLUCOSE, CAPILLARY
Glucose-Capillary: 195 mg/dL — ABNORMAL HIGH (ref 70–99)
Glucose-Capillary: 213 mg/dL — ABNORMAL HIGH (ref 70–99)
Glucose-Capillary: 172 mg/dL — ABNORMAL HIGH (ref 70–99)
Glucose-Capillary: 162 mg/dL — ABNORMAL HIGH (ref 70–99)

## 2023-06-05 LAB — HEPATITIS PANEL, ACUTE
HCV Ab: NONREACTIVE
Hep A IgM: NONREACTIVE
Hep B C IgM: NONREACTIVE
Hepatitis B Surface Ag: NONREACTIVE

## 2023-06-05 MED ORDER — LOPERAMIDE HCL 2 MG PO CAPS
2.0000 mg | ORAL_CAPSULE | Freq: Two times a day (BID) | ORAL | Status: DC
  Administered 2023-06-05 (×2): 2 mg via ORAL
  Filled 2023-06-05 (×2): qty 1

## 2023-06-05 MED ORDER — IOHEXOL 9 MG/ML PO SOLN
500.0000 mL | ORAL | Status: AC
  Administered 2023-06-05 (×2): 500 mL via ORAL

## 2023-06-05 MED ORDER — MELATONIN 5 MG PO TABS
5.0000 mg | ORAL_TABLET | Freq: Every evening | ORAL | Status: AC | PRN
  Administered 2023-06-05 (×2): 5 mg via ORAL
  Filled 2023-06-05 (×2): qty 1

## 2023-06-05 MED ORDER — IOHEXOL 300 MG/ML  SOLN
100.0000 mL | Freq: Once | INTRAMUSCULAR | Status: AC | PRN
  Administered 2023-06-05: 100 mL via INTRAVENOUS

## 2023-06-05 NOTE — Plan of Care (Signed)

## 2023-06-05 NOTE — Progress Notes (Signed)
 Subjective: CC: Some LLQ pain today. Still getting crampy abdominal pain after po intake. No n/v. Continues to have liquid bm's.   Afebrile. HR 100's. No systolic hypotension. WBC 28.1 (TRH reports patient recently received Neupogen )  Objective: Vital signs in last 24 hours: Temp:  [97.5 F (36.4 C)-97.9 F (36.6 C)] 97.5 F (36.4 C) (05/06 0659) Pulse Rate:  [106-110] 109 (05/06 0659) Resp:  [18-20] 20 (05/06 0659) BP: (122-143)/(50-64) 122/64 (05/06 0659) SpO2:  [92 %-96 %] 94 % (05/06 0659) Last BM Date : 06/05/23  Intake/Output from previous day: 05/05 0701 - 05/06 0700 In: 500 [IV Piggyback:500] Out: 2000 [Urine:150; Stool:1850] Intake/Output this shift: No intake/output data recorded.  PE: Gen:  Alert, NAD, pleasant Abd: Soft, mild distension, generalized ttp that is greatest in the LLQ without rigidity or guarding, +BS  Lab Results:  Recent Labs    06/04/23 0635 06/05/23 0900  WBC 19.5* 28.1*  HGB 8.9* 9.3*  HCT 27.0* 28.9*  PLT 27* 33*   BMET Recent Labs    06/04/23 0635 06/05/23 0900  NA 141 138  K 3.6 4.0  CL 114* 113*  CO2 18* 20*  GLUCOSE 96 195*  BUN 16 19  CREATININE 0.87 0.90  CALCIUM  9.8 10.1   PT/INR No results for input(s): "LABPROT", "INR" in the last 72 hours. CMP     Component Value Date/Time   NA 138 06/05/2023 0900   NA 139 08/23/2017 0933   NA 140 11/01/2016 0818   K 4.0 06/05/2023 0900   K 4.4 11/01/2016 0818   CL 113 (H) 06/05/2023 0900   CL 107 04/16/2012 0923   CO2 20 (L) 06/05/2023 0900   CO2 26 11/01/2016 0818   GLUCOSE 195 (H) 06/05/2023 0900   GLUCOSE 141 (H) 11/01/2016 0818   GLUCOSE 115 (H) 04/16/2012 0923   BUN 19 06/05/2023 0900   BUN 24 08/23/2017 0933   BUN 17.9 11/01/2016 0818   CREATININE 0.90 06/05/2023 0900   CREATININE 1.59 (H) 05/21/2023 1003   CREATININE 1.1 11/01/2016 0818   CALCIUM  10.1 06/05/2023 0900   CALCIUM  10.3 11/01/2016 0818   PROT 5.0 (L) 06/05/2023 0900   PROT 7.8  11/01/2016 0818   ALBUMIN 2.3 (L) 06/05/2023 0900   ALBUMIN 3.8 11/01/2016 0818   AST 27 06/05/2023 0900   AST 48 (H) 05/21/2023 1003   AST 75 (H) 11/01/2016 0818   ALT 33 06/05/2023 0900   ALT 34 05/21/2023 1003   ALT 75 (H) 11/01/2016 0818   ALKPHOS 189 (H) 06/05/2023 0900   ALKPHOS 72 11/01/2016 0818   BILITOT 0.7 06/05/2023 0900   BILITOT 0.7 05/21/2023 1003   BILITOT 0.78 11/01/2016 0818   GFRNONAA >60 06/05/2023 0900   GFRNONAA 33 (L) 05/21/2023 1003   GFRAA 44 (L) 06/05/2019 0933   Lipase     Component Value Date/Time   LIPASE 67 (H) 05/29/2023 0303    Studies/Results: No results found.  Anti-infectives: Anti-infectives (From admission, onward)    Start     Dose/Rate Route Frequency Ordered Stop   05/31/23 1600  vancomycin  (VANCOREADY) IVPB 1250 mg/250 mL  Status:  Discontinued        1,250 mg 166.7 mL/hr over 90 Minutes Intravenous Every 48 hours 05/29/23 1437 05/30/23 1416   05/31/23 1445  fidaxomicin  (DIFICID ) tablet 200 mg        200 mg Oral 2 times daily 05/31/23 1353 06/10/23 0959   05/31/23 1345  vancomycin  (VANCOCIN ) capsule 125  mg  Status:  Discontinued        125 mg Oral Daily 05/31/23 1253 05/31/23 1353   05/31/23 1000  fidaxomicin  (DIFICID ) tablet 200 mg  Status:  Discontinued        200 mg Oral 2 times daily 05/31/23 0812 05/31/23 0813   05/31/23 1000  metroNIDAZOLE  (FLAGYL ) IVPB 500 mg        500 mg 100 mL/hr over 60 Minutes Intravenous Every 12 hours 05/31/23 0813     05/29/23 2300  cefTRIAXone  (ROCEPHIN ) 2 g in sodium chloride  0.9 % 100 mL IVPB        2 g 200 mL/hr over 30 Minutes Intravenous Every 24 hours 05/29/23 2211     05/29/23 1700  ceFEPIme  (MAXIPIME ) 2 g in sodium chloride  0.9 % 100 mL IVPB  Status:  Discontinued        2 g 200 mL/hr over 30 Minutes Intravenous Every 12 hours 05/29/23 1437 05/29/23 2211   05/29/23 1600  metroNIDAZOLE  (FLAGYL ) IVPB 500 mg  Status:  Discontinued        500 mg 100 mL/hr over 60 Minutes Intravenous Every  12 hours 05/29/23 1511 05/31/23 0812   05/29/23 1500  vancomycin  (VANCOREADY) IVPB 2000 mg/400 mL        2,000 mg 200 mL/hr over 120 Minutes Intravenous  Once 05/29/23 1437 05/29/23 1724   05/29/23 0430  ceFEPIme  (MAXIPIME ) 2 g in sodium chloride  0.9 % 100 mL IVPB        2 g 200 mL/hr over 30 Minutes Intravenous  Once 05/29/23 0416 05/29/23 0523        Assessment/Plan Diverticulitis with abscess and recurrent/persistent C. difficile colitis  - CT w/ pericolonic abscess that was reviewed by IR and is not amenable to drainage at present. Will repeat CT A/P today - Abx per ID - Okay for FLD, adv as tolerated.  - No emergent surgical needs - We will follow with you   FEN - FLD; advance diet as tolerated VTE - SCDs, chem ppx on hold due to significant thrombocytopenia ID - Rocephin /Flagyl /Dificid  per ID. Afebrile. WBC 28.1 (19.5) - did recently get Neupogen     Pancytopenia likely secondary to chemotherapy - platelet transfusion today per Oncology E. coli bacteremia Cirrhosis Small cell lung cancer, currently on chemotherapy with metastatic disease Diabetes mellitus GERD History of renal cell cancer, status post left nephrectomy Hypertension Obesity Chronic kidney disease Hyperlipidemia  I reviewed nursing notes, hospitalist notes, last 24 h vitals and pain scores, last 48 h intake and output, last 24 h labs and trends, and last 24 h imaging results.   LOS: 7 days    Delton Filbert, [facility] 06/05/2023, 11:03 AM Please see Amion for pager number during day hours 7:00am-4:30pm

## 2023-06-05 NOTE — Progress Notes (Signed)
 PT Cancellation Note  Patient Details Name: Amanda Crawford MRN: [id number] DOB: 03-Jun-1945   Cancelled Treatment:     Pt off floor for Abdominal CT, will re-attempt next available date/time per POC.    Diona Franklin 06/05/2023, 2:50 PM

## 2023-06-05 NOTE — Progress Notes (Signed)
 Pharmacy Note regarding Infliximab Consult  Pharmacy consulted by oncology to dose infliximab for this 34 yoF with PMH significant for small cell lung cancer, prior renal cell cancer with lung mets, DM2, CKD, recently hospitalized in March for septic shock diverticulitis and C. difficile infection, discharged on oral Augmentin  and oral vancomycin .  With the completion of antibiotics, patient's symptoms improved but then started again and on 4/29, patient presented to the ED with complaint of persistent watery diarrhea weakness, poor oral intake, confusion.  Labs: 4/30  C. Difficile PCR and antigen positive, C. Difficile toxin negative 5/5    albumin 2.3   Medications: 03/2023 - 05/2022 treated with durvalumab  (Imfinzi ) Patient is currently on fidaxomicin  200 mg po BID, ceftriaxone  2 g IV q24h and metronidazole  500 mg IV q12h.  Cholestyramine 4 g BID and loperamide 2 g BID prn diarrhea or loose stools x 3 days also added.    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  Plan: Screen for tuberculosis and hepatitis with Quantiferon-TB Gold Plus and hepatitis panel before initiating infliximab If screening negative, initiate infliximab 5 mg/kg IV x 1    Thank you for allowing pharmacy to be a part of this patient's care.  Alfredo Inch, PharmD, BCPS Clinical Pharmacist [place] 06/05/2023 9:14 AM

## 2023-06-05 NOTE — Progress Notes (Addendum)
 [facility]  Date of Admission:  05/29/2023   Total days of inpatient antibiotics 4  Principal Problem:   Sepsis (HCC) Active Problems:   Neutropenia (HCC)   Enteritis due to Clostridium difficile          Assessment/plan 78 yo female with small cell lung cancer (mets to hip), hx [facility] pulm mets (left nephrectomy 2001), dm2, ckd3, dm2, diverticulitis 03/2023 admission finished augmentin  course complicated by cdiff at that time s/p 2 weeks po vanc, cirrhosis (imaging finding 03/2023), on chemo (last dose/cycle on 05/21/23), admitted with neutropenic fever, found to have ecoli bsi and intraabd abscess, along with new onset severe diarrhea     #cdiff Has always just had cdiff pcr not toxin positivity Hx does however in march and also this admission suggestive of cdiff infection 5/1 Started fidaxomycin  5/5 discussed with Dr Bolling Bushy regarding checkpoint inhibitor associated colitis as no improvement in diarrhea 06/05/23 first day ?no stool output so far -- discussed with nursing will check on rectal tube placement (5/5 1000 mL output), even in setting of Cholestyramine and immodium not yet given as of this early afternoon. Dr Bolling Bushy had placed infliximab ?for possible checkpoint inhibitor associated colitis  -perhaps we can hold another few days and see what the diarrhea is doing -- will hold infliximab and discuss with dr Maria Shiner -continue dificid  and plan to cover the tail end with PO Vanc (her insurance doesn't allow enough to cover for the duration of systemic abx and taper) -she has a supply sent to home with some difficid already -cholestyramine and prn immodium -surgery team planning repeat abd pelv ct and will follow up result -maintain enteric/contact isolation    #diverticulitis #intraabd abscess #ecoli bsi #neutropenic fever Iv abx-->augmentin  short course finished 03/3023 Still imaging finding diverticulitis and abscess Abd pain all over  ?cdiff related Suspect source for ecoli bsi is abscess.    One question remains is if the port has been seeded. Would not be able to tell. However reassuring that her sepsis had resolved. Plan to repeat bcx and if negative to treat for bsi/intraabd abscess. If recurrent ecoli bsi after diverticulitis/abscess resolve then port needs to be removed   Discussed with id pharmacy [facility]. Augmentin  mic on the higher side for ecoli but still sensitive. Will treat for 2 weeks if iv ceftriaxone  (will cover duration of bsi) and then transition to long augmentin  course (3-4 more weeks)    Will need secondary prophylaxis cdiff coverage while on systemic abx   -5/6 wbc count improving      Microbiology:   Antibiotics: 5/1-c fidaxomycin 4/29-c ceftriaxone                                                       4/29-c metronidazole      SUBJECTIVE: No stool today Some abdominal cramping No fever/chill Slight improvement in wbc   Interval:   Review of Systems: Review of Systems  All other systems reviewed and are negative.    Scheduled Meds:  Chlorhexidine  Gluconate Cloth  6 each Topical Daily   cholestyramine light  4 g Oral BID   dicyclomine   10 mg Oral TID AC & HS   fidaxomicin   200 mg Oral BID   Gerhardt's butt cream   Topical TID  insulin  aspart  0-15 Units Subcutaneous TID WC   insulin  aspart  0-5 Units Subcutaneous QHS   insulin  glargine-yfgn  14 Units Subcutaneous QHS   loperamide  2 mg Oral BID   simethicone   80 mg Oral QID   sodium chloride  flush  10-40 mL Intracatheter Q12H   Continuous Infusions:  cefTRIAXone  (ROCEPHIN )  IV Stopped (06/05/23 0015)   metronidazole  Stopped (06/05/23 1430)   PRN Meds:.acetaminophen  **OR** [DISCONTINUED] acetaminophen , albuterol , dicyclomine , melatonin, morphine  injection, ondansetron  **OR** ondansetron  (ZOFRAN ) IV, mouth rinse, sodium chloride  flush Allergies  Allergen Reactions   Culturelle Probiotics [Lactobacillus]      Patient should not take probiotics while immunocompromised from chemo    Bactrim  Ds [Sulfamethoxazole -Trimethoprim ] Rash    OBJECTIVE: Vitals:   06/04/23 1513 06/04/23 2100 06/05/23 0659 06/05/23 1318  BP: (!) 143/50 135/63 122/64 132/61  Pulse: (!) 106 (!) 110 (!) 109 99  Resp: 18 18 20 20   Temp: 97.8 F (36.6 C) 97.9 F (36.6 C) (!) 97.5 F (36.4 C) 98 F (36.7 C)  TempSrc: Oral Oral Oral Oral  SpO2: 92% 96% 94% 97%  Weight:      Height:       Body mass index is 33.9 kg/m.  Physical Exam Constitutional:      Appearance: Normal appearance.  HENT:     Head: Normocephalic and atraumatic.     Right Ear: Tympanic membrane normal.     Left Ear: Tympanic membrane normal.     Nose: Nose normal.     Mouth/Throat:     Mouth: Mucous membranes are moist.  Eyes:     Extraocular Movements: Extraocular movements intact.     Conjunctiva/sclera: Conjunctivae normal.     Pupils: Pupils are equal, round, and reactive to light.  Cardiovascular:     Rate and Rhythm: Normal rate and regular rhythm.     Heart sounds: No murmur heard.    No friction rub. No gallop.  Pulmonary:     Effort: Pulmonary effort is normal.     Breath sounds: Normal breath sounds.  Abdominal:     General: Abdomen is flat.     Palpations: Abdomen is soft.  Musculoskeletal:        General: Normal range of motion.  Skin:    General: Skin is warm and dry.  Neurological:     General: No focal deficit present.     Mental Status: She is alert and oriented to person, place, and time.  Psychiatric:        Mood and Affect: Mood normal.    06/04/23 abd exam Soft, mild tender llq (improved tenderness from last week)   Lab Results Lab Results  Component Value Date   WBC 28.1 (H) 06/05/2023   HGB 9.3 (L) 06/05/2023   HCT 28.9 (L) 06/05/2023   MCV 100.7 (H) 06/05/2023   PLT 33 (L) 06/05/2023    Lab Results  Component Value Date   CREATININE 0.90 06/05/2023   BUN 19 06/05/2023   NA 138 06/05/2023   K 4.0  06/05/2023   CL 113 (H) 06/05/2023   CO2 20 (L) 06/05/2023    Lab Results  Component Value Date   ALT 33 06/05/2023   AST 27 06/05/2023   ALKPHOS 189 (H) 06/05/2023   BILITOT 0.7 06/05/2023     Imaging: Reviewed   4/30 ct abd pelv with contrast 1. Sigmoid diverticulitis. 2. 4.4 x 5.6 x 3.5 cm fluid collection adjacent to the inflamed sigmoid colon compatible with abscess. 3.  Mild right-sided hydronephrosis and hydroureter to the level of the right UVJ. No obstructing stone or mass lesion is evident. This may represent a recently passed stone. 4. Left nephrectomy. 5. Multilevel degenerative changes in the lumbar spine. 6. Coronary artery disease      Jamesetta Mcbride, MD [facility] 06/05/2023, 2:59 PM

## 2023-06-05 NOTE — Progress Notes (Signed)
 PROGRESS NOTE  Amanda Crawford  DOB: [birth year]  PCP: Neda Balk, MD HQI:[id number]  DOA: 05/29/2023  LOS: 7 days  Hospital Day: 8  Brief narrative: Amanda Crawford is a 78 y.o. female with PMH significant for small cell lung cancer, prior renal cell cancer with lung mets, DM2, CKD, recently hospitalized in March for septic shock diverticulitis and C. difficile infection, discharged on oral Augmentin  and oral vancomycin . With the completion of antibiotics, patient's symptoms improved but few days ago, started again 4/29, patient presented to the ED with complaint of persistent watery diarrhea weakness, poor oral intake, confusion.  Initial labs with WBC count significantly low at 0.4, platelet 48,  Blood culture was sent Started on broad-spectrum antibiotics for neutropenia  Admitted to [facility] Blood culture sent on admission is growing E. coli 4/30, C. difficile PCR positive 4/30, CT abdomen and pelvis showed sigmoid diverticulitis, 3.5 X 4.4 X 5.6 cm abscess.  Consultations obtained from oncology, ID, general surgery and IR.  Hospitalization continues because of persistent diarrhea  Subjective: Patient was seen and examined this morning. Pleasant elderly Caucasian female.  Propped up in bed.  Not in distress.  Family not at bedside.  Abdomen pain improving. WBC count and lactic acid level improving However continues to have persistent liquid diarrhea About 2 L of liquid stool in the last 24 hours in the Flexi-Seal.  Assessment and plan: Sepsis POA Sigmoid diverticulitis and abscess E. coli bacteremia Immunocompromise status Presented with abdominal pain, diarrhea.   Imaging showed sigmoid diverticulitis with abscess  Per IR, abscess is not amenable to percutaneous drainage. Per ID recommendation, patient is currently on IV Rocephin , and IV Flagyl . General surgery plans to repeat CT abdomen and pelvis today.  Acute diarrhea  C. difficile colitis Patient had recently  completed treatment for C. difficile in March with oral vancomycin  Presented this time with recurrence of diarrhea, poor oral intake 4/30, C. difficile PCR and antigen positive. Currently on Dificid  per ID recommendation.  Despite that patient continues to have loose bowel movement. Flexi-Seal in place with liquid stool.  About 2 L of output in last 24 hours. There is also concern that she could be having diarrhea due to autoimmune colitis from cancer treatment especially because C. difficile toxin was negative.  5/5, started on Questran 4 g twice daily scheduled and Imodium 2 mg twice daily as needed.  Seems she has not been given any dose of Imodium.  I would schedule it at 2 mg for 4 doses.  Monitor response. It seems Dr. Maria Shiner has requested for infliximab per pharmacy  Acute on chronic anemia  Patient has chronic macrocytic anemia related to cancer  Hemoglobin improved after 1 unit PRBC.  Currently stable close to 9.  No active bleeding. Continue iron supplement Was also on aspirin , currently on hold Recent Labs    09/07/22 1110 10/12/22 1036 02/08/23 1110 03/14/23 1116 06/01/23 0618 06/02/23 0524 06/03/23 0546 06/04/23 0635 06/05/23 0900  HGB  --    < > 13.0   < > 7.6* 8.8* 8.1* 8.9* 9.3*  MCV  --    < > 101.7*   < > 101.7* 102.2* 99.2 100.0 100.7*  VITAMINB12 612  --  784  --   --   --   --   --   --    < > = values in this interval not displayed.    Neutropenia Due to chemotherapy.  Neupogen  added by oncology.  WBC count rising up gradually.  Neutropenic precautions.   Recent Labs  Lab 06/01/23 0618 06/02/23 0524 06/03/23 0546 06/04/23 0635 06/05/23 0900  WBC 0.8* 2.1* 4.3 19.5* 28.1*   Severe thrombocytopenia 2 units of platelet transfused so far.  Platelet low but gradually improving. Thankfully no bleeding. Continue SCDs for DVT prophylaxis.   Recent Labs  Lab 05/31/23 0400 06/01/23 0618 06/02/23 0524 06/03/23 0546 06/04/23 0635 06/05/23 0900  PLT 15*  7* 9* 20* 27* 33*    Extensive stage small cell lung cancer on chemotherapy Under the care of Dr. Maria Shiner Per oncology note, she had 3 cycles of chemotherapy, last on 05/21/2023.    Type 2 diabetes mellitus A1c 7.6 on 02/08/2023 PTA meds-Semglee  nightly.  No longer on glimepiride  because of hypoglycemia episodes recently Currently not on scheduled insulin , continue SSI/Accu-Cheks Recent Labs  Lab 06/04/23 1247 06/04/23 1621 06/04/23 2153 06/05/23 0754 06/05/23 1137  GLUCAP 88 121* 301* 195* 213*   CKD 3B Acute metabolic acidosis Renal function at baseline.  Continue to monitor Serum bicarb level improving as well.  Off bicarb drip today.  Recent Labs    04/16/23 1321 04/30/23 0910 05/21/23 1003 05/29/23 0303 05/31/23 0400 06/01/23 0618 06/02/23 0524 06/03/23 0546 06/04/23 0635 06/05/23 0900  BUN 15 22 28* 46* 36* 34* 27* 19 16 19   CREATININE 1.24* 1.53* 1.59* 1.35* 1.00 0.90 0.87 0.78 0.87 0.90  CO2 26 26 25  19* 18* 17* 14* 19* 18* 20*   Hypertension PTA meds- Coreg  half tab of 3.125 mg twice daily Coreg  currently on hold due to low blood pressure, poor oral intake and diarrhea.  Liver cirrhosis new finding on CT scan during her last hospital stay, will need outpatient GI follow-up for this   Hyperlipidemia statin on hold due to liver cirrhosis.  Acute urinary retention Foley catheter inserted on 5/4.  Expect improvement in bladder function once mobility improves.   Mobility: Impaired mobility due to chemo.  Continues to need PT  Goals of care   Code Status: Full Code     DVT prophylaxis:  Place and maintain sequential compression device Start: 05/31/23 0835   Antimicrobials: IV Rocephin , IV Flagyl , and Dificid  Fluid: Completed bicarb drip Consultants: Oncology, ID, general surgery Family Communication: Family not at at bedside  Status: Inpatient Level of care:  Telemetry Medical   Patient is from: Home Needs to continue in-hospital care: Ongoing  workup for diarrhea, diverticulitis, abscess Anticipated d/c to: Pending clinical course   Diet:  Diet Order             Diet full liquid Fluid consistency: Thin  Diet effective now                   Scheduled Meds:  Chlorhexidine  Gluconate Cloth  6 each Topical Daily   cholestyramine light  4 g Oral BID   dicyclomine   10 mg Oral TID AC & HS   fidaxomicin   200 mg Oral BID   Gerhardt's butt cream   Topical TID   insulin  aspart  0-15 Units Subcutaneous TID WC   insulin  aspart  0-5 Units Subcutaneous QHS   insulin  glargine-yfgn  14 Units Subcutaneous QHS   simethicone   80 mg Oral QID   sodium chloride  flush  10-40 mL Intracatheter Q12H    PRN meds: acetaminophen  **OR** [DISCONTINUED] acetaminophen , albuterol , dicyclomine , loperamide, melatonin, morphine  injection, ondansetron  **OR** ondansetron  (ZOFRAN ) IV, mouth rinse, sodium chloride  flush   Infusions:   cefTRIAXone  (ROCEPHIN )  IV Stopped (06/05/23 0015)   metronidazole  500 mg (  06/05/23 1143)    Antimicrobials: Anti-infectives (From admission, onward)    Start     Dose/Rate Route Frequency Ordered Stop   05/31/23 1600  vancomycin  (VANCOREADY) IVPB 1250 mg/250 mL  Status:  Discontinued        1,250 mg 166.7 mL/hr over 90 Minutes Intravenous Every 48 hours 05/29/23 1437 05/30/23 1416   05/31/23 1445  fidaxomicin  (DIFICID ) tablet 200 mg        200 mg Oral 2 times daily 05/31/23 1353 06/10/23 0959   05/31/23 1345  vancomycin  (VANCOCIN ) capsule 125 mg  Status:  Discontinued        125 mg Oral Daily 05/31/23 1253 05/31/23 1353   05/31/23 1000  fidaxomicin  (DIFICID ) tablet 200 mg  Status:  Discontinued        200 mg Oral 2 times daily 05/31/23 0812 05/31/23 0813   05/31/23 1000  metroNIDAZOLE  (FLAGYL ) IVPB 500 mg        500 mg 100 mL/hr over 60 Minutes Intravenous Every 12 hours 05/31/23 0813     05/29/23 2300  cefTRIAXone  (ROCEPHIN ) 2 g in sodium chloride  0.9 % 100 mL IVPB        2 g 200 mL/hr over 30 Minutes  Intravenous Every 24 hours 05/29/23 2211     05/29/23 1700  ceFEPIme  (MAXIPIME ) 2 g in sodium chloride  0.9 % 100 mL IVPB  Status:  Discontinued        2 g 200 mL/hr over 30 Minutes Intravenous Every 12 hours 05/29/23 1437 05/29/23 2211   05/29/23 1600  metroNIDAZOLE  (FLAGYL ) IVPB 500 mg  Status:  Discontinued        500 mg 100 mL/hr over 60 Minutes Intravenous Every 12 hours 05/29/23 1511 05/31/23 0812   05/29/23 1500  vancomycin  (VANCOREADY) IVPB 2000 mg/400 mL        2,000 mg 200 mL/hr over 120 Minutes Intravenous  Once 05/29/23 1437 05/29/23 1724   05/29/23 0430  ceFEPIme  (MAXIPIME ) 2 g in sodium chloride  0.9 % 100 mL IVPB        2 g 200 mL/hr over 30 Minutes Intravenous  Once 05/29/23 0416 05/29/23 0523       Objective: Vitals:   06/05/23 0659 06/05/23 1318  BP: 122/64 132/61  Pulse: (!) 109 99  Resp: 20 20  Temp: (!) 97.5 F (36.4 C) 98 F (36.7 C)  SpO2: 94% 97%    Intake/Output Summary (Last 24 hours) at 06/05/2023 1340 Last data filed at 06/05/2023 1146 Gross per 24 hour  Intake 510 ml  Output 2000 ml  Net -1490 ml   Filed Weights   05/29/23 0304 05/29/23 1416  Weight: 93.4 kg 89.6 kg   Weight change:  Body mass index is 33.9 kg/m.   Physical Exam: General exam: Very pleasant, elderly Caucasian female.  Not in pain.  Flexi-Seal with liquid stool Skin: No rashes, lesions or ulcers. HEENT: Atraumatic, normocephalic, no obvious bleeding Lungs: Clear to auscultation bilaterally CVS: S1, S2, no murmur,   GI/Abd: Soft, mild tenderness remains in lower abdomen, nondistended, bowel sound present,   CNS: Alert, awake, oriented x 3.  Slow to respond Psychiatry: Mood appropriate Extremities: No pedal edema, no calf tenderness  Data Review: I have personally reviewed the laboratory data and studies available.  F/u labs ordered Unresulted Labs (From admission, onward)     Start     Ordered   06/06/23 0500  Magnesium   Daily,   R     Question:  Specimen collection  method  Answer:  Unit=Unit collect   06/05/23 0656   06/05/23 0850  Hepatitis panel, acute  Once,   R       Question:  Specimen collection method  Answer:  Unit=Unit collect   06/05/23 0849   06/05/23 0850  QuantiFERON-TB Gold Plus  Once,   R       Question:  Specimen collection method  Answer:  Unit=Unit collect   06/05/23 0849   06/05/23 0657  CBC with Differential/Platelet  Daily,   R     Question:  Specimen collection method  Answer:  Unit=Unit collect   06/05/23 5784   06/05/23 0657  Comprehensive metabolic panel  Daily,   R     Question:  Specimen collection method  Answer:  Unit=Unit collect   06/05/23 0656   06/02/23 0741  Prepare platelet pheresis  (Blood Administration Adult)  Once,   R       Question Answer Comment  Number of Apheresis Units (1 unit of apheresis platelets will increase platelets 30,000/mL in an avg sized adult) 1 unit   Transfusion Indications Plt = 10,000   Date/Time blood product needed For transfusion   If emergent release call blood bank Not emergent release      06/02/23 0741            Signed, Hoyt Macleod, MD [facility] 06/05/2023

## 2023-06-05 NOTE — Progress Notes (Signed)
 She is still having diarrhea.  It is possible that the diarrhea can be secondary to her immunotherapy.  I think maybe it be safer to give her infliximab and not steroids.  I think a dose of infliximab would be very reasonable for her.  There is no labs done yet today.  She has had no problems with pain.  She still has these abdominal spasms.  She has had no vomiting.  I think she is still taking only clear liquids.  She has had no bleeding.  She responded very nicely to Neupogen .  Yesterday her white cell count was up quite a bit.  Again, there is no lab work back yet today to see how everything looks.  She has a E. coli bacteremia.  This really should not be a problem for her.  The E. coli was relatively sensitive to antibiotics.  I know that ID is following her for the C. difficile.   On her physical exam, temperature 97.5.  Pulse 109.  Blood pressure 122/64.  Her head and neck exam shows no ocular or oral lesions.  There is no palpable cervical or supraclavicular lymph nodes.  Lungs are clear.  Cardiac exam slight tachycardia but regular.  Abdomen is soft.  Bowel sounds are present.  There is no fluid wave.  There is no guarding or rebound tenderness.  Extremity shows no clubbing, cyanosis or edema.  Neurological exam is nonfocal.   Again, I think we will try infliximab for the diarrhea.  Again I do not know if this is from the immunotherapy that she took.  I really do not see much of a downside in doing infliximab.  We will have to see what her counts look like.  Hopefully, the diarrhea will begin to resolve.   Rayleen Cal, MD  Psalm 6:2

## 2023-06-05 NOTE — Progress Notes (Signed)
 Flexiseal changed due to previous one leaking. Port a cath needled changed. Blood returned noted, labs drawn, and saline locked.  Wound care performed on buttocks and new dressing placed. Development of wound included in media tab with updated pictures.

## 2023-06-06 DIAGNOSIS — A4151 Sepsis due to Escherichia coli [E. coli]: Secondary | ICD-10-CM | POA: Diagnosis not present

## 2023-06-06 DIAGNOSIS — D709 Neutropenia, unspecified: Secondary | ICD-10-CM | POA: Diagnosis not present

## 2023-06-06 DIAGNOSIS — A0472 Enterocolitis due to Clostridium difficile, not specified as recurrent: Secondary | ICD-10-CM | POA: Diagnosis not present

## 2023-06-06 DIAGNOSIS — R7881 Bacteremia: Secondary | ICD-10-CM

## 2023-06-06 DIAGNOSIS — K5720 Diverticulitis of large intestine with perforation and abscess without bleeding: Secondary | ICD-10-CM

## 2023-06-06 LAB — COMPREHENSIVE METABOLIC PANEL WITH GFR
ALT: 28 U/L (ref 0–44)
AST: 23 U/L (ref 15–41)
Albumin: 2.2 g/dL — ABNORMAL LOW (ref 3.5–5.0)
Alkaline Phosphatase: 179 U/L — ABNORMAL HIGH (ref 38–126)
Anion gap: 6 (ref 5–15)
BUN: 22 mg/dL (ref 8–23)
CO2: 18 mmol/L — ABNORMAL LOW (ref 22–32)
Calcium: 9.7 mg/dL (ref 8.9–10.3)
Chloride: 107 mmol/L (ref 98–111)
Creatinine, Ser: 0.84 mg/dL (ref 0.44–1.00)
GFR, Estimated: >60 mL/min (ref >60)
Glucose, Bld: 143 mg/dL — ABNORMAL HIGH (ref 70–99)
Potassium: 3.9 mmol/L (ref 3.5–5.1)
Sodium: 131 mmol/L — ABNORMAL LOW (ref 135–145)
Total Bilirubin: 0.4 mg/dL (ref 0.0–1.2)
Total Protein: 4.6 g/dL — ABNORMAL LOW (ref 6.5–8.1)

## 2023-06-06 LAB — CBC WITH DIFFERENTIAL/PLATELET
Abs Immature Granulocytes: 2.73 K/uL — ABNORMAL HIGH (ref 0.00–0.07)
Basophils Absolute: 0 K/uL (ref 0.0–0.1)
Basophils Relative: 0 %
Eosinophils Absolute: 0 K/uL (ref 0.0–0.5)
Eosinophils Relative: 0 %
HCT: 27.9 % — ABNORMAL LOW (ref 36.0–46.0)
Hemoglobin: 8.9 g/dL — ABNORMAL LOW (ref 12.0–15.0)
Immature Granulocytes: 9 %
Lymphocytes Relative: 6 %
Lymphs Abs: 1.9 K/uL (ref 0.7–4.0)
MCH: 33.3 pg (ref 26.0–34.0)
MCHC: 31.9 g/dL (ref 30.0–36.0)
MCV: 104.5 fL — ABNORMAL HIGH (ref 80.0–100.0)
Monocytes Absolute: 1.9 K/uL — ABNORMAL HIGH (ref 0.1–1.0)
Monocytes Relative: 7 %
Neutro Abs: 22.7 K/uL — ABNORMAL HIGH (ref 1.7–7.7)
Neutrophils Relative %: 78 %
Platelets: 40 K/uL — ABNORMAL LOW (ref 150–400)
RBC: 2.67 MIL/uL — ABNORMAL LOW (ref 3.87–5.11)
RDW: 18 % — ABNORMAL HIGH (ref 11.5–15.5)
WBC: 29.2 K/uL — ABNORMAL HIGH (ref 4.0–10.5)
nRBC: 0.3 % — ABNORMAL HIGH (ref 0.0–0.2)

## 2023-06-06 LAB — GLUCOSE, CAPILLARY
Glucose-Capillary: 127 mg/dL — ABNORMAL HIGH (ref 70–99)
Glucose-Capillary: 86 mg/dL (ref 70–99)
Glucose-Capillary: 114 mg/dL — ABNORMAL HIGH (ref 70–99)

## 2023-06-06 LAB — PLATELET COUNT: Platelets: 45 10*3/uL — ABNORMAL LOW (ref 150–400)

## 2023-06-06 LAB — PROTIME-INR
INR: 1.3 — ABNORMAL HIGH (ref 0.8–1.2)
Prothrombin Time: 16.8 s — ABNORMAL HIGH (ref 11.4–15.2)

## 2023-06-06 LAB — MAGNESIUM: Magnesium: 1.7 mg/dL (ref 1.7–2.4)

## 2023-06-06 MED ORDER — LOPERAMIDE HCL 2 MG PO CAPS: 2.0000 mg | ORAL_CAPSULE | ORAL | Status: DC | PRN

## 2023-06-06 MED ORDER — METRONIDAZOLE 500 MG PO TABS
500.0000 mg | ORAL_TABLET | Freq: Two times a day (BID) | ORAL | Status: DC
  Administered 2023-06-06 – 2023-06-10 (×8): 500 mg via ORAL
  Filled 2023-06-06 (×9): qty 1

## 2023-06-06 NOTE — Progress Notes (Signed)
 Physical Therapy Treatment Patient Details Name: Amanda Crawford MRN: [id number] DOB: May 28, 1945 Today's Date: 06/06/2023   History of Present Illness Amanda Crawford is a 78 y.o. female admitted with sepsis; 4/30 C. difficile PCR and antigen positive. PMH significant for small cell lung cancer, prior renal cell cancer with lung mets, DM2, CKD, recently hospitalized in March for septic shock diverticulitis and C. difficile infection    PT Comments  Pt received in bed, husband present, pt agreeable to PT session. ModA for bed mobility primarily due to protecting placement of flexi-seal placement. Good static sitting balance at EOB, no dizziness or Abdominal discomfort. Sit<>stand with Min/CGA. Short distance gait in room with RW due to dyspnea upon exertion. Pt positioned to comfort in chair, all needs in reach. Will continue to progress acutely.    If plan is discharge home, recommend the following: A little help with walking and/or transfers;A little help with bathing/dressing/bathroom;Assistance with cooking/housework;Assist for transportation;Help with stairs or ramp for entrance   Can travel by private vehicle        Equipment Recommendations  None recommended by PT    Recommendations for Other Services       Precautions / Restrictions Precautions Precautions: Fall Recall of Precautions/Restrictions: Intact Precaution/Restrictions Comments: flexiseal Restrictions Weight Bearing Restrictions Per Provider Order: No     Mobility  Bed Mobility Overal bed mobility: Needs Assistance Bed Mobility: Supine to Sit Rolling: Min assist, Used rails   Supine to sit: Mod assist, HOB elevated, Used rails     General bed mobility comments: Increased assist to protect flexi-seal    Transfers Overall transfer level: Needs assistance Equipment used: Rolling walker (2 wheels) Transfers: Sit to/from Stand Sit to Stand: Min assist           General transfer comment: Cues for safety,  hand placement. Increased time.    Ambulation/Gait Ambulation/Gait assistance: Contact guard assist Gait Distance (Feet): 5 Feet Assistive device: Rolling walker (2 wheels) Gait Pattern/deviations: Step-through pattern, Decreased stride length Gait velocity: decr     General Gait Details: CGA for safety. Dyspnea 3/4. Fatigues fairly easily/quickly.   Stairs             Wheelchair Mobility     Tilt Bed    Modified Rankin (Stroke Patients Only)       Balance Overall balance assessment: Needs assistance Sitting-balance support: Feet supported Sitting balance-Leahy Scale: Fair     Standing balance support: Bilateral upper extremity supported, During functional activity, Reliant on assistive device for balance Standing balance-Leahy Scale: Fair                              Hotel manager: No apparent difficulties  Cognition Arousal: Alert Behavior During Therapy: WFL for tasks assessed/performed   PT - Cognitive impairments: No apparent impairments                       PT - Cognition Comments: Pleasant and cooperative Following commands: Intact      Cueing Cueing Techniques: Verbal cues  Exercises General Exercises - Lower Extremity Ankle Circles/Pumps: AROM, Both, 10 reps, Seated Long Arc Quad: AROM, Both, 10 reps, Seated    General Comments General comments (skin integrity, edema, etc.): plan to drain Abdominal abcess tomorrow. SOB upon exertion, SpO2 97% on RA      Pertinent Vitals/Pain Pain Assessment Pain Assessment: No/denies pain    Home Living  Prior Function            PT Goals (current goals can now be found in the care plan section) Acute Rehab PT Goals Patient Stated Goal: "no more diarrhea" Progress towards PT goals: Progressing toward goals    Frequency    Min 3X/week      PT Plan      Co-evaluation              AM-PAC PT "6  Clicks" Mobility   Outcome Measure  Help needed turning from your back to your side while in a flat bed without using bedrails?: A Little Help needed moving from lying on your back to sitting on the side of a flat bed without using bedrails?: A Little Help needed moving to and from a bed to a chair (including a wheelchair)?: A Little Help needed standing up from a chair using your arms (e.g., wheelchair or bedside chair)?: A Little Help needed to walk in hospital room?: A Little Help needed climbing 3-5 steps with a railing? : A Lot 6 Click Score: 17    End of Session Equipment Utilized During Treatment: Gait belt Activity Tolerance: Patient tolerated treatment well;Patient limited by fatigue Patient left: in chair;with call bell/phone within reach;with family/visitor present Nurse Communication: Mobility status PT Visit Diagnosis: Muscle weakness (generalized) (M62.81);Difficulty in walking, not elsewhere classified (R26.2)     Time: 1116-1140 PT Time Calculation (min) (ACUTE ONLY): 24 min  Charges:    $Therapeutic Exercise: 8-22 mins $Therapeutic Activity: 8-22 mins PT General Charges $$ ACUTE PT VISIT: 1 Visit                    Melvyn Stagers, PTA  Diona Franklin 06/06/2023, 1:26 PM

## 2023-06-06 NOTE — Progress Notes (Signed)
 PROGRESS NOTE    Amanda Crawford  ZOX:[id number] DOB: 12/26/1945 DOA: 05/29/2023 PCP: Neda Balk, MD   Brief Narrative:    Assessment and Plan:  Sepsis Present on admission.  Secondary to sigmoid diverticulitis with associated abscess in addition to E. coli bacteremia.  Complicated by immunocompromise status.  Sigmoid diverticulitis and abscess General Surgery consulted.  Recommendation for percutaneous drain per IR which was deferred initially.  ID consulted with antibiotic regimen recommendations.  Repeat CT abdomen/pelvis significant for enlarging abscess and continue recommendation for IR drain placement.  Significantly elevated white blood cell count after Neupogen ; unclear this is related to infection. - IR recommendations for drain: Deferred today secondary to platelets below 50,000 - Continue ceftriaxone  and Flagyl  - Will defer platelet transfusion since platelets are trending up  C. difficile colitis ID consulted.  Patient management fidaxomicin . - ID recommendations: Fidaxomicin , started on 5/1  E. coli bacteremia Likely source is from diverticulitis/abscess.  Patient is on appropriate antibiotics as mentioned above.  Pancytopenia Secondary to chemotherapy treatment.  Associated neutropenia.  Patient given Neupogen  for management of neutropenia in setting of bacterial infection.  Platelets improving.  Small cell lung cancer Currently on chemotherapy as an outpatient per oncology.  Diabetes mellitus type 2 Well controlled for age based on hemoglobin A1C of 7.6%. -Continue SSI and Semglee   CKD stage IIIb Baseline and stable.  Primary hypertension Coreg  held secondary to low blood pressure.  Cirrhosis of  the liver Newly diagnosed on CT imaging. Recommendation for outpatient GI follow-up.  Hyperlipidemia Patient's Lipitor held secondary to cirrhosis.  DVT prophylaxis: SCDs Code Status:   Code Status: Full Code Family Communication: None at  bedside Disposition Plan: Discharge pending ongoing specialist recommendations/management and transition to outpatient antibiotics   Consultants:    Procedures:    Antimicrobials:     Subjective: Patient is concerned about her rectal tube being removed secondary to incontinence issues.  Objective: BP 131/72 (BP Location: Left Arm)   Pulse (!) 101   Temp (!) 97.5 F (36.4 C) (Oral)   Resp 18   Ht 5\' 4"  (1.626 m)   Wt 89.6 kg   SpO2 93%   BMI 33.90 kg/m   Examination:  General exam: Appears calm and comfortable Respiratory system: Clear to auscultation. Respiratory effort normal. Cardiovascular system: S1 & S2 heard, RRR. Gastrointestinal system: Abdomen is nondistended, soft and mildly tender. Decreased bowel sounds heard. Central nervous system: Alert and oriented. No focal neurological deficits. Musculoskeletal: 2+ BLE pitting edema. No calf tenderness Skin: No cyanosis. No rashes Psychiatry: Judgement and insight appear normal. Mood & affect appropriate.    Data Reviewed: I have personally reviewed following labs and imaging studies  CBC Lab Results  Component Value Date   WBC 29.2 (H) 06/06/2023   RBC 2.67 (L) 06/06/2023   HGB 8.9 (L) 06/06/2023   HCT 27.9 (L) 06/06/2023   MCV 104.5 (H) 06/06/2023   MCH 33.3 06/06/2023   PLT 40 (L) 06/06/2023   MCHC 31.9 06/06/2023   RDW 18.0 (H) 06/06/2023   LYMPHSABS 1.9 06/06/2023   MONOABS 1.9 (H) 06/06/2023   EOSABS 0.0 06/06/2023   BASOSABS 0.0 06/06/2023     Last metabolic panel Lab Results  Component Value Date   NA 131 (L) 06/06/2023   K 3.9 06/06/2023   CL 107 06/06/2023   CO2 18 (L) 06/06/2023   BUN 22 06/06/2023   CREATININE 0.84 06/06/2023   GLUCOSE 143 (H) 06/06/2023   GFRNONAA >60 06/06/2023  GFRAA 44 (L) 06/05/2019   CALCIUM  9.7 06/06/2023   PHOS 1.6 (L) 06/03/2023   PROT 4.6 (L) 06/06/2023   ALBUMIN 2.2 (L) 06/06/2023   BILITOT 0.4 06/06/2023   ALKPHOS 179 (H) 06/06/2023   AST 23  06/06/2023   ALT 28 06/06/2023   ANIONGAP 6 06/06/2023    GFR: Estimated Creatinine Clearance: 57.9 mL/min (by C-G formula based on SCr of 0.84 mg/dL).  Recent Results (from the past 240 hours)  Blood culture (routine x 2)     Status: Abnormal   Collection Time: 05/29/23  4:17 AM   Specimen: BLOOD  Result Value Ref Range Status   Specimen Description   Final    BLOOD BLOOD RIGHT HAND Performed at [facility], [address] [place], [place] [address]    Special Requests   Final    BOTTLES DRAWN AEROBIC AND ANAEROBIC Blood Culture adequate volume Performed at [facility], [address] [place], [place] [address]    Culture  Setup Time   Final    GRAM NEGATIVE RODS AEROBIC BOTTLE ONLY CRITICAL RESULT CALLED TO, READ BACK BY AND VERIFIED WITH: PHARMD E. JACKSON [id number] @ 2203 FH Performed at [facility], [address], [place], [place] [address]    Culture ESCHERICHIA COLI (A)  Final   Report Status 05/31/2023 FINAL  Final   Organism ID, Bacteria ESCHERICHIA COLI  Final   Organism ID, Bacteria ESCHERICHIA COLI  Final      Susceptibility   Escherichia coli - KIRBY BAUER*    CEFAZOLIN  INTERMEDIATE Intermediate    Escherichia coli - MIC*    AMPICILLIN >=32 RESISTANT Resistant     CEFEPIME  <=0.12 SENSITIVE Sensitive     CEFTAZIDIME <=1 SENSITIVE Sensitive     CEFTRIAXONE  <=0.25 SENSITIVE Sensitive     CIPROFLOXACIN  >=4 RESISTANT Resistant     GENTAMICIN <=1 SENSITIVE Sensitive     IMIPENEM <=0.25 SENSITIVE Sensitive     TRIMETH /SULFA  <=20 SENSITIVE Sensitive     AMPICILLIN/SULBACTAM 8 SENSITIVE Sensitive     PIP/TAZO <=4 SENSITIVE Sensitive ug/mL    * ESCHERICHIA COLI    ESCHERICHIA COLI  Blood Culture ID Panel (Reflexed)     Status: Abnormal   Collection Time: 05/29/23  4:17 AM  Result Value Ref Range Status   Enterococcus faecalis NOT DETECTED NOT DETECTED Final   Enterococcus Faecium NOT DETECTED NOT DETECTED Final   Listeria  monocytogenes NOT DETECTED NOT DETECTED Final   Staphylococcus species NOT DETECTED NOT DETECTED Final   Staphylococcus aureus (BCID) NOT DETECTED NOT DETECTED Final   Staphylococcus epidermidis NOT DETECTED NOT DETECTED Final   Staphylococcus lugdunensis NOT DETECTED NOT DETECTED Final   Streptococcus species NOT DETECTED NOT DETECTED Final   Streptococcus agalactiae NOT DETECTED NOT DETECTED Final   Streptococcus pneumoniae NOT DETECTED NOT DETECTED Final   Streptococcus pyogenes NOT DETECTED NOT DETECTED Final   A.calcoaceticus-baumannii NOT DETECTED NOT DETECTED Final   Bacteroides fragilis NOT DETECTED NOT DETECTED Final   Enterobacterales DETECTED (A) NOT DETECTED Final    Comment: Enterobacterales represent a large order of gram negative bacteria, not a single organism. CRITICAL RESULT CALLED TO, READ BACK BY AND VERIFIED WITH: PHARMD E. JACKSON [id number] @ 2203 FH    Enterobacter cloacae complex NOT DETECTED NOT DETECTED Final   Escherichia coli DETECTED (A) NOT DETECTED Final    Comment: CRITICAL RESULT CALLED TO, READ BACK BY AND VERIFIED WITH: PHARMD E. JACKSON [id number] @ 2203 FH  Klebsiella aerogenes NOT DETECTED NOT DETECTED Final   Klebsiella oxytoca NOT DETECTED NOT DETECTED Final   Klebsiella pneumoniae NOT DETECTED NOT DETECTED Final   Proteus species NOT DETECTED NOT DETECTED Final   Salmonella species NOT DETECTED NOT DETECTED Final   Serratia marcescens NOT DETECTED NOT DETECTED Final   Haemophilus influenzae NOT DETECTED NOT DETECTED Final   Neisseria meningitidis NOT DETECTED NOT DETECTED Final   Pseudomonas aeruginosa NOT DETECTED NOT DETECTED Final   Stenotrophomonas maltophilia NOT DETECTED NOT DETECTED Final   Candida albicans NOT DETECTED NOT DETECTED Final   Candida auris NOT DETECTED NOT DETECTED Final   Candida glabrata NOT DETECTED NOT DETECTED Final   Candida krusei NOT DETECTED NOT DETECTED Final   Candida parapsilosis NOT DETECTED NOT DETECTED Final    Candida tropicalis NOT DETECTED NOT DETECTED Final   Cryptococcus neoformans/gattii NOT DETECTED NOT DETECTED Final   CTX-M ESBL NOT DETECTED NOT DETECTED Final   Carbapenem resistance IMP NOT DETECTED NOT DETECTED Final   Carbapenem resistance KPC NOT DETECTED NOT DETECTED Final   Carbapenem resistance NDM NOT DETECTED NOT DETECTED Final   Carbapenem resist OXA 48 LIKE NOT DETECTED NOT DETECTED Final   Carbapenem resistance VIM NOT DETECTED NOT DETECTED Final    Comment: Performed at [facility], [address] [place], [place] [address]  Blood culture (routine x 2)     Status: Abnormal   Collection Time: 05/29/23  4:22 AM   Specimen: BLOOD  Result Value Ref Range Status   Specimen Description   Final    BLOOD LEFT ANTECUBITAL Performed at [facility], [address] [place], [place] [address]    Special Requests   Final    BOTTLES DRAWN AEROBIC AND ANAEROBIC Blood Culture adequate volume Performed at [facility], [address] [place], [place] [address]    Culture  Setup Time   Final    GRAM NEGATIVE RODS IN BOTH AEROBIC AND ANAEROBIC BOTTLES CRITICAL VALUE NOTED.  VALUE IS CONSISTENT WITH PREVIOUSLY REPORTED AND CALLED VALUE.    Culture (A)  Final    ESCHERICHIA COLI SUSCEPTIBILITIES PERFORMED ON PREVIOUS CULTURE WITHIN THE LAST 5 DAYS. Performed at [facility], [address], [place], [place] [address]    Report Status 05/31/2023 FINAL  Final  C Difficile Quick Screen w PCR reflex     Status: Abnormal   Collection Time: 05/30/23 12:45 PM   Specimen: STOOL  Result Value Ref Range Status   C Diff antigen POSITIVE (A) NEGATIVE Final   C Diff toxin NEGATIVE NEGATIVE Final   C Diff interpretation Results are indeterminate. See PCR results.  Final    Comment: Performed at [facility], [address], [place], [place] [address]  C. Diff by PCR, Reflexed     Status: Abnormal   Collection Time: 05/30/23 12:45 PM   Result Value Ref Range Status   Toxigenic C. Difficile by PCR POSITIVE (A) NEGATIVE Final    Comment: Positive for toxigenic C. difficile with little to no toxin production. Only treat if clinical presentation suggests symptomatic illness. Performed at [facility], [address], [place], [place] [address]   Culture, blood (Routine X 2) w Reflex to ID Panel     Status: None   Collection Time: 05/31/23  1:32 PM   Specimen: BLOOD RIGHT ARM  Result Value Ref Range Status   Specimen Description   Final    BLOOD  RIGHT ARM Performed at [facility], [address], [place], [place] [address]    Special Requests   Final    BOTTLES DRAWN AEROBIC AND ANAEROBIC Blood Culture results may not be optimal due to an inadequate volume of blood received in culture bottles Performed at [facility], [address], [place], [place] [address]    Culture   Final    NO GROWTH 5 DAYS Performed at [facility], [address] [place], [place] [address]    Report Status 06/05/2023 FINAL  Final  Culture, blood (Routine X 2) w Reflex to ID Panel     Status: None   Collection Time: 05/31/23  1:39 PM   Specimen: BLOOD LEFT ARM  Result Value Ref Range Status   Specimen Description   Final    BLOOD LEFT ARM Performed at [facility], [address], [place], [place] [address]    Special Requests   Final    BOTTLES DRAWN AEROBIC AND ANAEROBIC Blood Culture results may not be optimal due to an inadequate volume of blood received in culture bottles Performed at [facility], [address], [place], [place] [address]    Culture   Final    NO GROWTH 5 DAYS Performed at [facility], [address] [place], [place] [address]    Report Status 06/05/2023 FINAL  Final      Radiology Studies: CT ABDOMEN PELVIS W CONTRAST Result Date: 06/05/2023 CLINICAL DATA:  Metastatic small cell lung cancer with recent perforated diverticulitis and  small peridiverticular abscess. Diarrhea. EXAM: CT ABDOMEN AND PELVIS WITH CONTRAST TECHNIQUE: Multidetector CT imaging of the abdomen and pelvis was performed using the standard protocol following bolus administration of intravenous contrast. RADIATION DOSE REDUCTION: This exam was performed according to the departmental dose-optimization program which includes automated exposure control, adjustment of the mA and/or kV according to patient size and/or use of iterative reconstruction technique. CONTRAST:  OMNIPAQUE  IOHEXOL  300 MG/ML  SOLN COMPARISON:  Abdominopelvic CT 05/30/2023.  PET-CT 05/14/2023. FINDINGS: Lower chest: Similar appearance of residual treated nodule at the right lung base, measuring approximately 1.9 x 1.5 cm on image 24/4. There is atelectasis or scarring at both lung bases. No significant pleural or pericardial effusion. Central venous catheter projects to the superior cavoatrial junction. There is aortic and coronary artery atherosclerosis. Distal esophageal wall thickening noted. Hepatobiliary: Chronic morphologic changes of cirrhosis. No suspicious focal liver lesion or biliary dilatation identified status post cholecystectomy. Pancreas: Unremarkable. No pancreatic ductal dilatation or surrounding inflammatory changes. Spleen: Normal in size without focal abnormality. Adrenals/Urinary Tract: Both adrenal glands appear normal. Previous left nephrectomy with stable appearance of the nephrectomy bed. The right kidney has a stable appearance with scarring in the lateral interpolar region. No evidence of urinary tract calculus or hydronephrosis. The urinary bladder is decompressed without apparent focal abnormality. Stomach/Bowel: There is enteric contrast within the distal small bowel and colon, extending to the level of the descending colon. The stomach appears unremarkable for its degree of distention. There is no significant small bowel distension, wall thickening or surrounding  inflammation. Status post appendectomy. No acute findings are seen in the proximal colon. Diverticulosis and wall thickening of the descending colon again noted with an enlarging collection of gas and fluid inferior to the sigmoid colon, measuring 4.7 x 3.7 cm on image 90/2. This collection appears more organized than on the previous study with mild peripheral enhancement, suspicious for developing  abscess. No contrast extravasation. A rectal tube is in place. Vascular/Lymphatic: There are no enlarged abdominal or pelvic lymph nodes. Aortic and branch vessel atherosclerosis without evidence of aneurysm or large vessel occlusion. The portal, superior mesenteric and splenic veins are patent. There are small gastric varices. Reproductive: Status post hysterectomy. No evidence of adnexal mass. Other: Interval development of a moderate amount of ascites throughout the peritoneal cavity. This fluid appears simple, measuring water density. There are no dependent high density components, extravasated enteric contrast or other focal fluid collection. No pneumoperitoneum. Interval increased mesenteric and subcutaneous edema. No focal abdominal wall hernia. Musculoskeletal: Stable known sclerotic metastasis involving the left iliac bone. Possible small metastasis within the right ischium. No progressive osseous metastatic disease or acute osseous findings identified. Old fracture of the right 6th rib laterally. There is multilevel spondylosis with prominent posterior osteophytes at L1-2 and L3-4. IMPRESSION: 1. Enlarging collection of gas and fluid inferior to the sigmoid colon, suspicious for developing abscess. No evidence of contrast extravasation or pneumoperitoneum. 2. Interval development of a moderate amount of ascites throughout the peritoneal cavity. Although nonspecific, this may relate to the patient's cirrhosis and/or reactive ascites from the diverticulitis. No other focal fluid collections are identified. 3.  Chronic morphologic changes of cirrhosis with small gastric varices. 4. Stable appearance of residual treated nodule at the right lung base. 5. Stable osseous metastatic disease. No progressive osseous metastatic disease identified. 6. Coronary and aortic Atherosclerosis (ICD10-I70.0). Electronically Signed   By: Elmon Hagedorn M.D.   On: 06/05/2023 15:55      LOS: 8 days    Aneita Keens, MD [facility] 06/06/2023, 10:20 AM   If 7PM-7AM, please contact night-coverage [email or web address]

## 2023-06-06 NOTE — Consult Note (Signed)
 Chief Complaint: Abdominal pain/abscess, diarrhea, lung cancer; referred for CT-guided drainage of abdominal abscess  Referring Provider(s): Burton,V  Supervising Physician: Alyssa Jumper  Patient Status: [facility] - In-pt  History of Present Illness: Amanda Crawford is an 78 y.o. female with past medical history of arthritis, cirrhosis, diabetes, GERD, gout, obesity, hypertension, anemia, remote renal cell cancer, chronic kidney disease, sleep apnea and small cell lung cancer.  Patient was recently hospitalized in March for septic shock, diverticulitis and C. difficile infection and discharged on oral Augmentin  and vancomycin .  Patient was readmitted to [facility] [place] on 4/29 with persistent watery diarrhea, weakness, poor oral intake, abdominal discomfort.  Found to have E. coli bacteremia along with sigmoid diverticulitis and associated abscess. She is also thrombocytopenic with current plt count of 40k. She was originally eval for abd abscess drain on 5/1 but area was too small and not in a location amenable to drain placement. Latest CT A/P yesterday revealed:    1. Enlarging collection of gas and fluid inferior to the sigmoid colon, suspicious for developing abscess. No evidence of contrast extravasation or pneumoperitoneum. 2. Interval development of a moderate amount of ascites throughout the peritoneal cavity. Although nonspecific, this may relate to the patient's cirrhosis and/or reactive ascites from the diverticulitis. No other focal fluid collections are identified. 3. Chronic morphologic changes of cirrhosis with small gastric varices. 4. Stable appearance of residual treated nodule at the right lung base. 5. Stable osseous metastatic disease. No progressive osseous metastatic disease identified. 6. Coronary and aortic Atherosclerosis  She is afebrile, WBC 29.2, hgb 8.9, PT/INR pend; request again received from [facility] for image guided abd abscess drainage   Patient is  Full Code  Past Medical History:  Diagnosis Date   Allergic state 03/12/2015   Arthritis    Cirrhosis (HCC)    Colon polyp 11/19/2014   Dermatitis 03/12/2015   Diarrhea 06/29/2016   Diverticulitis    DM (diabetes mellitus), type 2 (HCC) 06/29/2016   GERD (gastroesophageal reflux disease)    occ   Gout 03/12/2015   H/O measles    H/O mumps    Headache(784.0)    migraines   History of chicken pox 11/23/2014   Hyperglycemia 06/29/2016   Insomnia 02/13/2023   Lung cancer (HCC)    Migraine 11/23/2014   Obesity 11/23/2014   Pneumonia    child   Preventative health care 09/12/2015   Primary hypertension 12/15/2020   Renal cell cancer (HCC)    renal cell ca dx 9/01 and 8/08;   Renal insufficiency    Shortness of breath    occ   Skin lesion of breast 03/12/2015   Sleep apnea 01/24/2023   recently dx 01/24/23 - CPAP ordered by not received yet   Small cell carcinoma of middle lobe of right lung (HCC) 03/14/2023   Small cell lung cancer, right middle lobe (HCC) 03/14/2023    Past Surgical History:  Procedure Laterality Date   ABDOMINAL HYSTERECTOMY     ANAL RECTAL MANOMETRY N/A 06/29/2021   Procedure: ANO RECTAL MANOMETRY;  Surgeon: Annis Kinder, DO;  Location: [facility] ENDOSCOPY;  Service: Gastroenterology;  Laterality: N/A;   APPENDECTOMY     BRONCHIAL BIOPSY  02/20/2023   Procedure: BRONCHIAL BIOPSIES;  Surgeon: Denson Flake, MD;  Location: [facility] ENDOSCOPY;  Service: Pulmonary;;   BRONCHIAL BRUSHINGS  02/20/2023   Procedure: BRONCHIAL BRUSHINGS;  Surgeon: Denson Flake, MD;  Location: [facility] ENDOSCOPY;  Service: Pulmonary;;   BRONCHIAL NEEDLE ASPIRATION BIOPSY  02/20/2023   Procedure: BRONCHIAL NEEDLE ASPIRATION BIOPSIES;  Surgeon: Denson Flake, MD;  Location: [facility] ENDOSCOPY;  Service: Pulmonary;;   CARDIAC CATHETERIZATION     yrs ago neg   CHOLECYSTECTOMY     COLONOSCOPY  2018   ESOPHAGOGASTRODUODENOSCOPY     years ago   FINE NEEDLE ASPIRATION  02/20/2023   Procedure:  FINE NEEDLE ASPIRATION (FNA) LINEAR;  Surgeon: Denson Flake, MD;  Location: [facility] ENDOSCOPY;  Service: Pulmonary;;   IR IMAGING GUIDED PORT INSERTION  03/21/2023   IR RADIOLOGIST EVAL & MGMT  07/17/2018   IR RADIOLOGIST EVAL & MGMT  09/12/2018   IR RADIOLOGIST EVAL & MGMT  06/18/2019   IR RADIOLOGIST EVAL & MGMT  01/13/2020   IR RADIOLOGIST EVAL & MGMT  07/08/2020   IR RADIOLOGIST EVAL & MGMT  10/12/2020   KIDNEY SURGERY  2001   Removed of left kidney    LUNG CANCER SURGERY Left 2008   RADIOLOGY WITH ANESTHESIA N/A 08/21/2018   Procedure: CT WITH ANESTHESIA RENAL CRYOABLATION;  Surgeon: Marland Silvas, MD;  Location: [facility] ORS;  Service: Radiology;  Laterality: N/A;   RENAL MASS EXCISION Left 2001   RIGHT/LEFT HEART CATH AND CORONARY ANGIOGRAPHY N/A 07/27/2016   Procedure: Right/Left Heart Cath and Coronary Angiography;  Surgeon: Arleen Lacer, MD;  Location: [facility] INVASIVE CV LAB;  Service: Cardiovascular;  Laterality: N/A;   THORACOTOMY Left 05/09/2012   Procedure: THORACOTOMY MAJOR;  Surgeon: Bartley Lightning, MD;  Location: [facility] OR;  Service: Thoracic;  Laterality: Left;   VIDEO BRONCHOSCOPY N/A 05/09/2012   Procedure: VIDEO BRONCHOSCOPY;  Surgeon: Bartley Lightning, MD;  Location: [facility] OR;  Service: Thoracic;  Laterality: N/A;   VIDEO BRONCHOSCOPY WITH ENDOBRONCHIAL ULTRASOUND  02/20/2023   Procedure: VIDEO BRONCHOSCOPY WITH ENDOBRONCHIAL ULTRASOUND;  Surgeon: Denson Flake, MD;  Location: [facility] ENDOSCOPY;  Service: Pulmonary;;   WEDGE RESECTION Left 05/09/2012   Procedure: LEFT UPPER LOBE WEDGE RESECTION;  Surgeon: Bartley Lightning, MD;  Location: [facility] OR;  Service: Thoracic;  Laterality: Left;    Allergies: Culturelle probiotics [lactobacillus] and Bactrim  ds [sulfamethoxazole -trimethoprim ]  Medications: Prior to Admission medications   Medication Sig Start Date End Date Taking? Authorizing Provider  allopurinol  (ZYLOPRIM ) 300 MG tablet TAKE 1 TABLET BY MOUTH EVERY DAY 03/15/23  Yes Neda Balk, MD  aspirin  EC 81 MG tablet Take 81 mg by mouth daily.   Yes [provider]  atorvastatin  (LIPITOR) 20 MG tablet TAKE 1 TABLET BY MOUTH EVERY DAY 05/21/23  Yes Neda Balk, MD  carvedilol  (COREG ) 3.125 MG tablet Take 0.5 tablets (1.5625 mg total) by mouth 2 (two) times daily with a meal. 02/20/23  Yes Byrum, Delora Ferry, MD  dexamethasone  (DECADRON ) 4 MG tablet Take 2 tablets daily for 2 days, on days 4 and 5.Take with food. Every 21 days. 03/14/23  Yes Ivor Mars, MD  diphenhydramine -acetaminophen  (TYLENOL  PM) 25-500 MG TABS Take 1 tablet by mouth at bedtime as needed (sleep).   Yes [provider]  ferrous sulfate  325 (65 FE) MG tablet Take 325 mg by mouth at bedtime.   Yes [provider]  fluconazole  (DIFLUCAN ) 100 MG tablet Take 1 tablet (100 mg total) by mouth daily. 05/08/23  Yes Ennever, Sherryll Donald, MD  glimepiride  (AMARYL ) 2 MG tablet TAKE 1 TABLET BY MOUTH EVERY DAY WITH BREAKFAST 05/21/23  Yes Neda Balk, MD  hyoscyamine  (LEVSIN SL) 0.125 MG SL tablet Place 1 tablet (0.125 mg total) under the tongue  every 6 (six) hours as needed. 04/16/23  Yes Neda Balk, MD  ondansetron  (ZOFRAN ) 8 MG tablet Take 1 tablet (8 mg total) by mouth every 8 (eight) hours as needed for nausea or vomiting. Start on third day after chemotherapy. 03/14/23  Yes Ennever, Sherryll Donald, MD  SEMGLEE , YFGN, 100 UNIT/ML Pen Inject 14 Units into the skin at bedtime. 12/27/22  Yes [provider]  Vitamin D , Ergocalciferol , (DRISDOL ) 1.25 MG (50000 UNIT) CAPS capsule TAKE 1 CAPSULE (50,000 UNITS TOTAL) BY MOUTH EVERY 7 (SEVEN) DAYS. 04/05/23  Yes Webb, Padonda B, FNP  zolpidem  (AMBIEN ) 5 MG tablet Take 1 tablet (5 mg total) by mouth at bedtime. 04/24/23  Yes Olalere, Adewale A, MD  glucose blood (ONETOUCH VERIO) test strip CHECK BLOOD SUGAR DAILY AS NEEDED 05/22/23   Neda Balk, MD  insulin  glargine (LANTUS ) 100 unit/mL SOPN Inject 14 Units into the skin at bedtime. 04/26/23: Reports  during TOC 30-day program call: she is currently taking this insulin  14 U at bedtime: reports has not started Semglee  insulin  because she still had Lantus  at home to use- per PCP advice Patient not taking: Reported on 05/29/2023    Neda Balk, MD  Insulin  Pen Needle (BD PEN NEEDLE NANO 2ND GEN) 32G X 4 MM MISC USE AS DIRECTED WITH LANTUS  ONCE A DAY. 11/20/22   Neda Balk, MD  Lancets [facility] DELICA PLUS LANCET33G) MISC USE AS DIRECTED TO TEST DAILY 09/11/22   Neda Balk, MD  vancomycin  (VANCOCIN ) 125 MG capsule Take 125 mg by mouth 4 (four) times daily. Patient not taking: Reported on 05/28/2023 04/18/23   [provider]  glyBURIDE -metformin  (GLUCOVANCE ) 1.25-250 MG tablet Take 1 tablet by mouth 2 (two) times daily with a meal. 03/25/19 03/25/19  Neda Balk, MD     Family History  Problem Relation Age of Onset   Dementia Mother    Heart failure Father    Diabetes Father    Hyperlipidemia Father    Heart disease Father    Hypertension Father    Asthma Brother    Asthma Daughter    Alcohol abuse Maternal Aunt    Colon cancer Paternal Aunt    Cancer Paternal Grandmother    Hearing loss Paternal Grandfather    Stroke Paternal Grandfather     Social History   Socioeconomic History   Marital status: Married    Spouse name: Not on file   Number of children: 2   Years of education: Not on file   Highest education level: 12th grade  Occupational History   Occupation: retired  Tobacco Use   Smoking status: Former    Current packs/day: 0.00    Average packs/day: 0.5 packs/day for 35.0 years (17.5 ttl pk-yrs)    Types: Cigarettes    Start date: 05/08/1971    Quit date: 05/08/2006    Years since quitting: 17.0   Smokeless tobacco: Never  Vaping Use   Vaping status: Never Used  Substance and Sexual Activity   Alcohol use: Yes    Comment: seldomly   Drug use: No   Sexual activity: Not Currently    Comment: lives with husband, no dietary restrictions.    Other Topics Concern   Not on file  Social History Narrative   Not on file   Social Drivers of Health   Financial Resource Strain: Low Risk  (07/05/2020)   Overall Financial Resource Strain (CARDIA)    Difficulty of Paying Living Expenses: Not hard at  all  Food Insecurity: No Food Insecurity (05/29/2023)   Hunger Vital Sign    Worried About Running Out of Food in the Last Year: Never true    Ran Out of Food in the Last Year: Never true  Transportation Needs: No Transportation Needs (05/29/2023)   PRAPARE - Administrator, Civil Service (Medical): No    Lack of Transportation (Non-Medical): No  Physical Activity: Inactive (07/05/2020)   Exercise Vital Sign    Days of Exercise per Week: 0 days    Minutes of Exercise per Session: 0 min  Stress: No Stress Concern Present (08/12/2022)   Received from [facility] Occupational Health - Occupational Stress Questionnaire    Feeling of Stress : Not at all  Social Connections: Socially Integrated (05/29/2023)   Social Connection and Isolation Panel [NHANES]    Frequency of Communication with Friends and Family: Three times a week    Frequency of Social Gatherings with Friends and Family: Once a week    Attends Religious Services: More than 4 times per year    Active Member of [employer] or Organizations: Yes    Attends Engineer, structural: More than 4 times per year    Marital Status: Married       Review of Systems denies fever,HA,CP,dyspnea, cough, N/V or bleeding; does have abd discomfort, occ back pain, loose stools  Vital Signs: BP 131/72 (BP Location: Left Arm)   Pulse (!) 101   Temp (!) 97.5 F (36.4 C) (Oral)   Resp 18   Ht 5\' 4"  (1.626 m)   Wt 197 lb 8 oz (89.6 kg)   SpO2 93%   BMI 33.90 kg/m   Advance Care Plan: no documents on file  Physical Exam: awake/answers questions appropriately; chest- sl dim BS bases; intact rt chest port a cath; heart- RRR; abd-obese, soft,+BS, some  mild gen tenderness to palpation; tr-1+ pretibial edema bilat  Imaging: CT ABDOMEN PELVIS W CONTRAST Result Date: 06/05/2023 CLINICAL DATA:  Metastatic small cell lung cancer with recent perforated diverticulitis and small peridiverticular abscess. Diarrhea. EXAM: CT ABDOMEN AND PELVIS WITH CONTRAST TECHNIQUE: Multidetector CT imaging of the abdomen and pelvis was performed using the standard protocol following bolus administration of intravenous contrast. RADIATION DOSE REDUCTION: This exam was performed according to the departmental dose-optimization program which includes automated exposure control, adjustment of the mA and/or kV according to patient size and/or use of iterative reconstruction technique. CONTRAST:  OMNIPAQUE  IOHEXOL  300 MG/ML  SOLN COMPARISON:  Abdominopelvic CT 05/30/2023.  PET-CT 05/14/2023. FINDINGS: Lower chest: Similar appearance of residual treated nodule at the right lung base, measuring approximately 1.9 x 1.5 cm on image 24/4. There is atelectasis or scarring at both lung bases. No significant pleural or pericardial effusion. Central venous catheter projects to the superior cavoatrial junction. There is aortic and coronary artery atherosclerosis. Distal esophageal wall thickening noted. Hepatobiliary: Chronic morphologic changes of cirrhosis. No suspicious focal liver lesion or biliary dilatation identified status post cholecystectomy. Pancreas: Unremarkable. No pancreatic ductal dilatation or surrounding inflammatory changes. Spleen: Normal in size without focal abnormality. Adrenals/Urinary Tract: Both adrenal glands appear normal. Previous left nephrectomy with stable appearance of the nephrectomy bed. The right kidney has a stable appearance with scarring in the lateral interpolar region. No evidence of urinary tract calculus or hydronephrosis. The urinary bladder is decompressed without apparent focal abnormality. Stomach/Bowel: There is enteric contrast within the distal  small bowel and colon, extending to the level of  the descending colon. The stomach appears unremarkable for its degree of distention. There is no significant small bowel distension, wall thickening or surrounding inflammation. Status post appendectomy. No acute findings are seen in the proximal colon. Diverticulosis and wall thickening of the descending colon again noted with an enlarging collection of gas and fluid inferior to the sigmoid colon, measuring 4.7 x 3.7 cm on image 90/2. This collection appears more organized than on the previous study with mild peripheral enhancement, suspicious for developing abscess. No contrast extravasation. A rectal tube is in place. Vascular/Lymphatic: There are no enlarged abdominal or pelvic lymph nodes. Aortic and branch vessel atherosclerosis without evidence of aneurysm or large vessel occlusion. The portal, superior mesenteric and splenic veins are patent. There are small gastric varices. Reproductive: Status post hysterectomy. No evidence of adnexal mass. Other: Interval development of a moderate amount of ascites throughout the peritoneal cavity. This fluid appears simple, measuring water density. There are no dependent high density components, extravasated enteric contrast or other focal fluid collection. No pneumoperitoneum. Interval increased mesenteric and subcutaneous edema. No focal abdominal wall hernia. Musculoskeletal: Stable known sclerotic metastasis involving the left iliac bone. Possible small metastasis within the right ischium. No progressive osseous metastatic disease or acute osseous findings identified. Old fracture of the right 6th rib laterally. There is multilevel spondylosis with prominent posterior osteophytes at L1-2 and L3-4. IMPRESSION: 1. Enlarging collection of gas and fluid inferior to the sigmoid colon, suspicious for developing abscess. No evidence of contrast extravasation or pneumoperitoneum. 2. Interval development of a moderate amount of  ascites throughout the peritoneal cavity. Although nonspecific, this may relate to the patient's cirrhosis and/or reactive ascites from the diverticulitis. No other focal fluid collections are identified. 3. Chronic morphologic changes of cirrhosis with small gastric varices. 4. Stable appearance of residual treated nodule at the right lung base. 5. Stable osseous metastatic disease. No progressive osseous metastatic disease identified. 6. Coronary and aortic Atherosclerosis (ICD10-I70.0). Electronically Signed   By: Elmon Hagedorn M.D.   On: 06/05/2023 15:55   CT ABDOMEN PELVIS W CONTRAST Result Date: 05/30/2023 CLINICAL DATA:  Abdominal pain, acute, nonlocalized. EXAM: CT ABDOMEN AND PELVIS WITH CONTRAST TECHNIQUE: Multidetector CT imaging of the abdomen and pelvis was performed using the standard protocol following bolus administration of intravenous contrast. RADIATION DOSE REDUCTION: This exam was performed according to the departmental dose-optimization program which includes automated exposure control, adjustment of the mA and/or kV according to patient size and/or use of iterative reconstruction technique. CONTRAST:  OMNIPAQUE  IOHEXOL  300 MG/ML  SOLN COMPARISON:  CT of the abdomen and pelvis 04/08/2023. FINDINGS: Lower chest: Mild dependent atelectasis is present in the right lower lobe. The lungs are otherwise clear. The heart size is normal. Coronary artery calcifications are present. No significant pleural or pericardial effusion is present. Hepatobiliary: No focal liver abnormality is seen. Status post cholecystectomy. No biliary dilatation. Pancreas: Minimal inflammatory changes about the head and tail the pancreas are less prominent than on the prior exam. No discrete lesions are present. Spleen: Normal in size without focal abnormality. Adrenals/Urinary Tract: The adrenal glands are normal bilaterally. Mild right-sided hydronephrosis are present. Scarring at the lower pole of the right  kidney is stable. The right ureter is dilated to the level of the right UVJ. No obstructing stone or mass lesion is evident. Stranding is noted about the distal ureter. Left nephrectomy is again noted. The urinary bladder is mildly distended. Stomach/Bowel: The stomach and duodenum are within normal limits. Small bowel is  unremarkable. Terminal ileum is within normal limits. The appendix is absent. The ascending and transverse colon are within normal limits. The descending colon demonstrates some distal diverticular changes. Diffuse diverticular changes are present the sigmoid colon. Marked wall thickening is present proximally. A fluid collection adjacent to the inflamed sigmoid colon measures 4.4 x 5.6 x 3.5 cm. It abuts the left side of the urinary bladder. No free fluid is present. The distal sigmoid colon and rectum are within normal limits. Vascular/Lymphatic: Atherosclerotic calcifications are present in the aorta and branch vessels. No aneurysm is present. No significant adenopathy is present. Reproductive: Status post hysterectomy. No adnexal masses. Other: No abdominal wall hernia or abnormality. No abdominopelvic ascites. Musculoskeletal: Multilevel degenerative changes are again noted in the lumbar spine. Vacuum disc is present at L1-2, L3-4, L4-5 and L5-S1. Right foraminal narrowing and bilateral scratched at right foraminal stenosis is present at L4-5. Bilateral foraminal narrowing is present at L5-S1. IMPRESSION: 1. Sigmoid diverticulitis. 2. 4.4 x 5.6 x 3.5 cm fluid collection adjacent to the inflamed sigmoid colon compatible with abscess. 3. Mild right-sided hydronephrosis and hydroureter to the level of the right UVJ. No obstructing stone or mass lesion is evident. This may represent a recently passed stone. 4. Left nephrectomy. 5. Multilevel degenerative changes in the lumbar spine. 6. Coronary artery disease. Electronically Signed   By: Audree Leas M.D.   On: 05/30/2023 16:49   CT  Angio Chest PE W and/or Wo Contrast Result Date: 05/29/2023 CLINICAL DATA:  Weakness and decreased appetite. Disorientation. Small-cell lung cancer. EXAM: CT ANGIOGRAPHY CHEST WITH CONTRAST TECHNIQUE: Multidetector CT imaging of the chest was performed using the standard protocol during bolus administration of intravenous contrast. Multiplanar CT image reconstructions and MIPs were obtained to evaluate the vascular anatomy. RADIATION DOSE REDUCTION: This exam was performed according to the departmental dose-optimization program which includes automated exposure control, adjustment of the mA and/or kV according to patient size and/or use of iterative reconstruction technique. CONTRAST:  60mL OMNIPAQUE  IOHEXOL  350 MG/ML SOLN COMPARISON:  Chest CT 04/08/2023 FINDINGS: Cardiovascular: The heart size is normal. No substantial pericardial effusion. Moderate atherosclerotic calcification is noted in the wall of the thoracic aorta. There is no filling defect within the opacified pulmonary arteries to suggest the presence of an acute pulmonary embolus. Mediastinum/Nodes: No mediastinal lymphadenopathy. There is no hilar lymphadenopathy. The esophagus has normal imaging features. There is no axillary lymphadenopathy. Lungs/Pleura: Retro hilar right lower lobe pulmonary lesion measured previously at 2.7 x 2.3 cm is 2.6 x 1.5 cm on image 63/303. Postsurgical changes noted in the region of the left hilum. No pleural effusion. Upper Abdomen: Nodular liver contour suggests cirrhosis. Musculoskeletal: No worrisome lytic or sclerotic osseous abnormality. Review of the MIP images confirms the above findings. IMPRESSION: 1. No CT evidence for acute pulmonary embolus. 2. Retro hilar right lower lobe pulmonary lesion measured previously at 2.7 x 2.3 cm is 2.6 x 1.5 cm. 3. Nodular liver contour suggests cirrhosis. 4.  Aortic Atherosclerosis (ICD10-I70.0). Electronically Signed   By: Donnal Fusi M.D.   On: 05/29/2023 08:15   DG Chest  1 View Result Date: 05/29/2023 CLINICAL DATA:  Altered mental status EXAM: CHEST  1 VIEW COMPARISON:  04/08/2023 FINDINGS: Lungs are clear. No pneumothorax or pleural effusion. Right internal jugular chest port tip is seen at the superior cavoatrial junction. Cardiac size within limits. Pulmonary vascularity is normal. IMPRESSION: 1. No active disease. Electronically Signed   By: Worthy Heads M.D.   On: 05/29/2023 03:44  CT Head Wo Contrast Result Date: 05/29/2023 EXAM: CT HEAD WITHOUT 05/29/2023 03:29:37 AM TECHNIQUE: CT of the head was performed without the administration of intravenous contrast. Automated exposure control, iterative reconstruction, and/or weight based adjustment of the mA/kV was utilized to reduce the radiation dose to as low as reasonably achievable. COMPARISON: 04/08/2023 CLINICAL HISTORY: Mental status change, unknown cause. Aphasia. Patient currently on chemotherapy. History of diabetes, lung cancer, migraine, renal cell cancer, and renal insufficiency. FINDINGS: BRAIN AND VENTRICLES: There is no acute intracranial hemorrhage, mass effect or midline shift. No abnormal extra-axial fluid collection. The gray-white differentiation is maintained without evidence of an acute infarct. There is no evidence of hydrocephalus. Mild scattered periventricular and subcortical low density, likely reflecting small vessel ischemic changes. ORBITS: The visualized portion of the orbits demonstrate no acute abnormality. SINUSES: The visualized paranasal sinuses and mastoid air cells demonstrate no acute abnormality. SOFT TISSUES AND SKULL: No acute abnormality of the visualized skull or soft tissues. VASCULATURE: Intracranial atherosclerosis. IMPRESSION: 1. No acute intracranial abnormality. 2. Mild small vessel ischemic changes. Electronically signed by: Zadie Herter MD 05/29/2023 03:32 AM EDT RP Workstation: [id number]   NM PET Image Restag (PS) Skull Base To Thigh Result Date:  05/17/2023 CLINICAL DATA:  Subsequent treatment strategy for small cell lung cancer. EXAM: NUCLEAR MEDICINE PET SKULL BASE TO THIGH TECHNIQUE: 10.2 mCi F-18 FDG was injected intravenously. Full-ring PET imaging was performed from the skull base to thigh after the radiotracer. CT data was obtained and used for attenuation correction and anatomic localization. Fasting blood glucose: 153 mg/dl COMPARISON:  09/81/1914 FINDINGS: Mediastinal blood pool activity: SUV max 3.5 Liver activity: SUV max NA NECK: No significant abnormal hypermetabolic activity in this region. Incidental CT findings: Bilateral common carotid atheromatous vascular calcification. CHEST: Most of the thoracic adenopathy has resolved. A remaining subcarinal lymph node measures 0.9 cm in diameter with maximum SUV 5.9. This previously measured 2.0 cm in diameter with maximum SUV 10.1. At the site of prior right infrahilar adenopathy, maximum SUV is currently 5.3 (previously 9.4 with previous 1.7 cm lymph node in this vicinity). Reduced size and activity in the right lower lobe nodule, currently density in this vicinity measures 1.7 by 2.3 cm with maximum SUV 4.9 (formerly 3.2 by 3.2 cm with maximum SUV 7.1). Incidental CT findings: Right Port-A-Cath tip: Right atrium. Coronary, aortic arch, and branch vessel atherosclerotic vascular disease. Mild cardiomegaly. Postoperative findings medially along the left upper lobe. ABDOMEN/PELVIS: Two foci of accentuated activity in the sigmoid colon probably correspond to mild diverticulitis for example on image 167 series 4 where there is signs of some local inflammation and associated activity with maximum SUV up to 8.3. Other areas of accentuated activity in bowel are likely physiologic. Incidental CT findings: Left oophorectomy. Partial right nephrectomy. Cholecystectomy. Small accessory spleen noted. Stable 1.8 by 1.2 cm hypodense lesion lateral to the spleen on image 117 series 4 is not metabolic and highly  likely to be benign/incidental. Sigmoid colon diverticulosis and mild diverticulitis. SKELETON: Heterogeneous uptake in the skeleton without a well-defined focal lesion healing lesion of the right fifth rib laterally with associated sclerosis, maximum SUV 3.3 and previously 6.5. Increased sclerosis in the left iliac bone lesion measuring 3.6 cm in long axis on image 148 series 4, maximum SUV 5.0 (formerly 7.3). This lesion no longer stands out against the background marrow activity. Incidental CT findings: Substantial posterior intervertebral spurring at the L1-2 level likely causing central narrowing of the thecal sac. IMPRESSION: 1. Most of the thoracic  adenopathy has resolved, with reduced size and activity of the remaining lymph nodes. 2. Reduced size and activity in the right lower lobe nodule. 3. Increased sclerosis in the left iliac bone lesion, maximum SUV 5.0 (formerly 7.3). This lesion no longer stands out against the background marrow activity. 4. Healing lesion of the right fifth rib laterally with associated sclerosis, maximum SUV 3.3 and previously 6.5. 5. Two foci of accentuated activity in the sigmoid colon probably correspond to mild diverticulitis. 6. Coronary, aortic arch, and branch vessel atherosclerotic vascular disease. Mild cardiomegaly. Aortic Atherosclerosis (ICD10-I70.0). 7. Low-grade diffuse marrow activity, query granulocyte stimulation. 8. Interbody spurring at L1-2 with associated central narrowing of the thecal sac. Electronically Signed   By: Freida Jes M.D.   On: 05/17/2023 12:08    Labs:  CBC: Recent Labs    06/03/23 0546 06/04/23 0635 06/05/23 0900 06/06/23 0542  WBC 4.3 19.5* 28.1* 29.2*  HGB 8.1* 8.9* 9.3* 8.9*  HCT 24.5* 27.0* 28.9* 27.9*  PLT 20* 27* 33* 40*    COAGS: No results for input(s): "INR", "APTT" in the last 8760 hours.  BMP: Recent Labs    06/03/23 0546 06/04/23 0635 06/05/23 0900 06/06/23 0542  NA 141 141 138 131*  K 2.9* 3.6  4.0 3.9  CL 114* 114* 113* 107  CO2 19* 18* 20* 18*  GLUCOSE 71 96 195* 143*  BUN 19 16 19 22   CALCIUM  9.2 9.8 10.1 9.7  CREATININE 0.78 0.87 0.90 0.84  GFRNONAA >60 >60 >60 >60    LIVER FUNCTION TESTS: Recent Labs    06/03/23 0546 06/04/23 0635 06/05/23 0900 06/06/23 0542  BILITOT 0.5 0.6 0.7 0.4  AST 25 35 27 23  ALT 30 33 33 28  ALKPHOS 84 147* 189* 179*  PROT 4.5* 4.9* 5.0* 4.6*  ALBUMIN 2.1* 2.3* 2.3* 2.2*    TUMOR MARKERS: No results for input(s): "AFPTM", "CEA", "CA199", "CHROMGRNA" in the last 8760 hours.  Assessment and Plan: 78 y.o. female with past medical history of arthritis, cirrhosis, diabetes, GERD, gout, obesity, hypertension, anemia, remote renal cell cancer, chronic kidney disease, sleep apnea and small cell lung cancer.  Patient was recently hospitalized in March for septic shock, diverticulitis and C. difficile infection and discharged on oral Augmentin  and vancomycin .  Patient was readmitted to [facility] on 4/29 with persistent watery diarrhea, weakness, poor oral intake, abdominal discomfort.  Found to have E. coli bacteremia along with sigmoid diverticulitis and associated abscess. She is also thrombocytopenic with current plt count of 40k. She was originally eval for abd abscess drain on 5/1 but area was too small and not in a location amenable to drain placement. Latest CT A/P yesterday revealed:    1. Enlarging collection of gas and fluid inferior to the sigmoid colon, suspicious for developing abscess. No evidence of contrast extravasation or pneumoperitoneum. 2. Interval development of a moderate amount of ascites throughout the peritoneal cavity. Although nonspecific, this may relate to the patient's cirrhosis and/or reactive ascites from the diverticulitis. No other focal fluid collections are identified. 3. Chronic morphologic changes of cirrhosis with small gastric varices. 4. Stable appearance of residual treated nodule at the right  lung base. 5. Stable osseous metastatic disease. No progressive osseous metastatic disease identified. 6. Coronary and aortic Atherosclerosis  Pt known to IR team from rt renal mass cryoablation in 2020, port a cath placement 03/21/23.  She is afebrile, WBC 29.2, hgb 8.9, PT/INR pend; request again received from [facility] for image guided abd abscess  drainage.Imaging studies were reviewed by Dr. Lovell Rubenstein. Risks and benefits discussed with the patient including bleeding, infection, damage to adjacent structures, bowel perforation/fistula connection, inability to place drain and sepsis.  All of the patient's questions were answered, patient is agreeable to proceed. Consent signed and in chart.  Plt count currently at 40k; will need plt at least 50k before proceeding with abd drain placement; [facility] aware.   Thank you for allowing our service to participate in Amanda Crawford 's care.  Electronically Signed: D. Honore Lux, PA-C   06/06/2023, 9:02 AM      I spent a total of    40 Minutes in face to face in clinical consultation, greater than 50% of which was counseling/coordinating care for image guided drainage of abdominal abscess

## 2023-06-06 NOTE — Plan of Care (Signed)
  Problem: Education: Goal: Knowledge of General Education information will improve Description: Including pain rating scale, medication(s)/side effects and non-pharmacologic comfort measures Outcome: Progressing   Problem: Coping: Goal: Level of anxiety will decrease Outcome: Progressing   Problem: Elimination: Goal: Will not experience complications related to urinary retention Outcome: Progressing   Problem: Pain Managment: Goal: General experience of comfort will improve and/or be controlled Outcome: Progressing   Problem: Skin Integrity: Goal: Risk for impaired skin integrity will decrease Outcome: Progressing

## 2023-06-06 NOTE — Plan of Care (Signed)

## 2023-06-06 NOTE — Progress Notes (Addendum)
 [facility]  Date of Admission:  05/29/2023   Total days of inpatient antibiotics 4  Principal Problem:   Sepsis (HCC) Active Problems:   Neutropenia (HCC)   C. difficile colitis          Assessment/plan 78 yo female with small cell lung cancer (mets to hip), hx [facility] pulm mets (left nephrectomy 2001), dm2, ckd3, dm2, diverticulitis 03/2023 admission finished augmentin  course complicated by cdiff at that time s/p 2 weeks po vanc, cirrhosis (imaging finding 03/2023), on chemo (last dose/cycle on 05/21/23), admitted with neutropenic fever, found to have ecoli bsi and intraabd abscess, along with new onset severe diarrhea     #cdiff Has always just had cdiff pcr not toxin positivity Hx does however in march and also this admission suggestive of cdiff infection 5/1 Started fidaxomycin  5/5 discussed with Dr Bolling Bushy regarding checkpoint inhibitor associated colitis as no improvement in diarrhea 06/05/23 first day ?no stool output so far -- discussed with nursing will check on rectal tube placement (5/5 1000 mL output), even in setting of Cholestyramine and immodium not yet given as of this early afternoon. Dr Bolling Bushy had placed infliximab ?for possible checkpoint inhibitor associated colitis 06/05/23 repeat abd pelv ct showed enlarging abscess to 4.7 cm -- no sign of toxic megacolon 06/06/23 given improved diarrhea last 24 hours; I have discussed with Dr Maria Shiner to stop infliximab    -continue dificid  and plan to cover the tail end with PO Vanc (her insurance doesn't allow enough to cover for the duration of systemic abx and taper) -she has a supply sent to home with some difficid already -cholestyramine and prn immodium -maintain enteric/contact isolation    #diverticulitis #intraabd abscess #ecoli bsi #neutropenic fever Iv abx-->augmentin  short course finished 03/3023 Still imaging finding diverticulitis and abscess Abd pain all over ?cdiff  related Suspect source for ecoli bsi is abscess.  Doesn't appear port seeded but we'll see how she does after this course of abx Discussed with [facility]. Augmentin  mic on the higher side for ecoli but still sensitive. Will treat for 2 weeks if iv ceftriaxone  (will cover duration of bsi) and then transition to long augmentin  course (3-4 more weeks)  Will need secondary prophylaxis cdiff coverage while on systemic abx 5/6 wbc count improving -- overshooting -- has had neupogen    -5/6 repeat abd pelv ct showed enlarging abscess --> primary team/surgery planning IR drainage      Microbiology:   Antibiotics: 5/1-c fidaxomycin 4/29-c ceftriaxone                                                       4/29-c metronidazole      SUBJECTIVE: No stool today Some abdominal cramping No fever/chill Slight improvement in wbc   Interval:   Review of Systems: Review of Systems  All other systems reviewed and are negative.    Scheduled Meds:  Chlorhexidine  Gluconate Cloth  6 each Topical Daily   cholestyramine light  4 g Oral BID   dicyclomine   10 mg Oral TID AC & HS   fidaxomicin   200 mg Oral BID   Gerhardt's butt cream   Topical TID   insulin  aspart  0-15 Units Subcutaneous TID WC   insulin  aspart  0-5 Units Subcutaneous QHS  insulin  glargine-yfgn  14 Units Subcutaneous QHS   metroNIDAZOLE   500 mg Oral Q12H   simethicone   80 mg Oral QID   sodium chloride  flush  10-40 mL Intracatheter Q12H   Continuous Infusions:  cefTRIAXone  (ROCEPHIN )  IV 2 g (06/05/23 2213)   PRN Meds:.acetaminophen  **OR** [DISCONTINUED] acetaminophen , albuterol , dicyclomine , loperamide, melatonin, morphine  injection, ondansetron  **OR** ondansetron  (ZOFRAN ) IV, mouth rinse, sodium chloride  flush Allergies  Allergen Reactions   Culturelle Probiotics [Lactobacillus]     Patient should not take probiotics while immunocompromised from chemo    Bactrim  Ds [Sulfamethoxazole -Trimethoprim ] Rash     OBJECTIVE: Vitals:   06/05/23 0659 06/05/23 1318 06/05/23 2135 06/06/23 0516  BP: 122/64 132/61 114/60 131/72  Pulse: (!) 109 99  (!) 101  Resp: 20 20 18 18   Temp: (!) 97.5 F (36.4 C) 98 F (36.7 C) 98 F (36.7 C) (!) 97.5 F (36.4 C)  TempSrc: Oral Oral Oral Oral  SpO2: 94% 97% 96% 93%  Weight:      Height:       Body mass index is 33.9 kg/m.  Physical Exam Constitutional:      Appearance: Normal appearance.  HENT:     Head: Normocephalic and atraumatic.     Right Ear: Tympanic membrane normal.     Left Ear: Tympanic membrane normal.     Nose: Nose normal.     Mouth/Throat:     Mouth: Mucous membranes are moist.  Eyes:     Extraocular Movements: Extraocular movements intact.     Conjunctiva/sclera: Conjunctivae normal.     Pupils: Pupils are equal, round, and reactive to light.  Cardiovascular:     Rate and Rhythm: Normal rate and regular rhythm.     Heart sounds: No murmur heard.    No friction rub. No gallop.  Pulmonary:     Effort: Pulmonary effort is normal.     Breath sounds: Normal breath sounds.  Abdominal:     General: Abdomen is flat.     Palpations: Abdomen is soft.  Musculoskeletal:        General: Normal range of motion.  Skin:    General: Skin is warm and dry.  Neurological:     General: No focal deficit present.     Mental Status: She is alert and oriented to person, place, and time.  Psychiatric:        Mood and Affect: Mood normal.    06/06/23 abd exam Soft, nontender   Lab Results Lab Results  Component Value Date   WBC 29.2 (H) 06/06/2023   HGB 8.9 (L) 06/06/2023   HCT 27.9 (L) 06/06/2023   MCV 104.5 (H) 06/06/2023   PLT 40 (L) 06/06/2023    Lab Results  Component Value Date   CREATININE 0.84 06/06/2023   BUN 22 06/06/2023   NA 131 (L) 06/06/2023   K 3.9 06/06/2023   CL 107 06/06/2023   CO2 18 (L) 06/06/2023    Lab Results  Component Value Date   ALT 28 06/06/2023   AST 23 06/06/2023   ALKPHOS 179 (H) 06/06/2023    BILITOT 0.4 06/06/2023     Imaging: Reviewed   4/30 ct abd pelv with contrast 1. Sigmoid diverticulitis. 2. 4.4 x 5.6 x 3.5 cm fluid collection adjacent to the inflamed sigmoid colon compatible with abscess. 3. Mild right-sided hydronephrosis and hydroureter to the level of the right UVJ. No obstructing stone or mass lesion is evident. This may represent a recently passed stone. 4. Left nephrectomy. 5.  Multilevel degenerative changes in the lumbar spine. 6. Coronary artery disease   5/6 abd pelv ct with contrast 1. Enlarging collection of gas and fluid inferior to the sigmoid colon, suspicious for developing abscess. No evidence of contrast extravasation or pneumoperitoneum. 2. Interval development of a moderate amount of ascites throughout the peritoneal cavity. Although nonspecific, this may relate to the patient's cirrhosis and/or reactive ascites from the diverticulitis. No other focal fluid collections are identified. 3. Chronic morphologic changes of cirrhosis with small gastric varices. 4. Stable appearance of residual treated nodule at the right lung base. 5. Stable osseous metastatic disease. No progressive osseous metastatic disease identified. 6. Coronary and aortic Atherosclerosis  Jamesetta Mcbride, MD [facility] 06/06/2023, 10:41 AM

## 2023-06-06 NOTE — Progress Notes (Signed)
 Subjective: Minimal abdominal pain today. Complains mostly of discomfort to back from positioning and from rectal tube.   Afebrile. HR 100's. No systolic hypotension. WBC 29.2 (TRH reports patient recently received Neupogen )  Objective: Vital signs in last 24 hours: Temp:  [97.5 F (36.4 C)-98 F (36.7 C)] 97.5 F (36.4 C) (05/07 0516) Pulse Rate:  [99-101] 101 (05/07 0516) Resp:  [18-20] 18 (05/07 0516) BP: (114-132)/(60-72) 131/72 (05/07 0516) SpO2:  [93 %-97 %] 93 % (05/07 0516) Last BM Date : (P) 06/06/23  Intake/Output from previous day: 05/06 0701 - 05/07 0700 In: 10 [I.V.:10] Out: 1300 [Urine:950; Stool:350] Intake/Output this shift: Total I/O In: 10 [I.V.:10] Out: -   PE: Gen:  Alert, NAD, pleasant Abd: Soft, mild distension, generalized ttp that is greatest in the LLQ without rigidity or guarding, +BS  Lab Results:  Recent Labs    06/05/23 0900 06/06/23 0542  WBC 28.1* 29.2*  HGB 9.3* 8.9*  HCT 28.9* 27.9*  PLT 33* 40*   BMET Recent Labs    06/05/23 0900 06/06/23 0542  NA 138 131*  K 4.0 3.9  CL 113* 107  CO2 20* 18*  GLUCOSE 195* 143*  BUN 19 22  CREATININE 0.90 0.84  CALCIUM  10.1 9.7   PT/INR Recent Labs    06/06/23 1000  LABPROT 16.8*  INR 1.3*   CMP     Component Value Date/Time   NA 131 (L) 06/06/2023 0542   NA 139 08/23/2017 0933   NA 140 11/01/2016 0818   K 3.9 06/06/2023 0542   K 4.4 11/01/2016 0818   CL 107 06/06/2023 0542   CL 107 04/16/2012 0923   CO2 18 (L) 06/06/2023 0542   CO2 26 11/01/2016 0818   GLUCOSE 143 (H) 06/06/2023 0542   GLUCOSE 141 (H) 11/01/2016 0818   GLUCOSE 115 (H) 04/16/2012 0923   BUN 22 06/06/2023 0542   BUN 24 08/23/2017 0933   BUN 17.9 11/01/2016 0818   CREATININE 0.84 06/06/2023 0542   CREATININE 1.59 (H) 05/21/2023 1003   CREATININE 1.1 11/01/2016 0818   CALCIUM  9.7 06/06/2023 0542   CALCIUM  10.3 11/01/2016 0818   PROT 4.6 (L) 06/06/2023 0542   PROT 7.8 11/01/2016 0818    ALBUMIN 2.2 (L) 06/06/2023 0542   ALBUMIN 3.8 11/01/2016 0818   AST 23 06/06/2023 0542   AST 48 (H) 05/21/2023 1003   AST 75 (H) 11/01/2016 0818   ALT 28 06/06/2023 0542   ALT 34 05/21/2023 1003   ALT 75 (H) 11/01/2016 0818   ALKPHOS 179 (H) 06/06/2023 0542   ALKPHOS 72 11/01/2016 0818   BILITOT 0.4 06/06/2023 0542   BILITOT 0.7 05/21/2023 1003   BILITOT 0.78 11/01/2016 0818   GFRNONAA >60 06/06/2023 0542   GFRNONAA 33 (L) 05/21/2023 1003   GFRAA 44 (L) 06/05/2019 0933   Lipase     Component Value Date/Time   LIPASE 67 (H) 05/29/2023 0303    Studies/Results: CT ABDOMEN PELVIS W CONTRAST Result Date: 06/05/2023 CLINICAL DATA:  Metastatic small cell lung cancer with recent perforated diverticulitis and small peridiverticular abscess. Diarrhea. EXAM: CT ABDOMEN AND PELVIS WITH CONTRAST TECHNIQUE: Multidetector CT imaging of the abdomen and pelvis was performed using the standard protocol following bolus administration of intravenous contrast. RADIATION DOSE REDUCTION: This exam was performed according to the departmental dose-optimization program which includes automated exposure control, adjustment of the mA and/or kV according to patient size and/or use of iterative reconstruction technique. CONTRAST:  100mL OMNIPAQUE   IOHEXOL  300 MG/ML  SOLN COMPARISON:  Abdominopelvic CT 05/30/2023.  PET-CT 05/14/2023. FINDINGS: Lower chest: Similar appearance of residual treated nodule at the right lung base, measuring approximately 1.9 x 1.5 cm on image 24/4. There is atelectasis or scarring at both lung bases. No significant pleural or pericardial effusion. Central venous catheter projects to the superior cavoatrial junction. There is aortic and coronary artery atherosclerosis. Distal esophageal wall thickening noted. Hepatobiliary: Chronic morphologic changes of cirrhosis. No suspicious focal liver lesion or biliary dilatation identified status post cholecystectomy. Pancreas: Unremarkable. No pancreatic  ductal dilatation or surrounding inflammatory changes. Spleen: Normal in size without focal abnormality. Adrenals/Urinary Tract: Both adrenal glands appear normal. Previous left nephrectomy with stable appearance of the nephrectomy bed. The right kidney has a stable appearance with scarring in the lateral interpolar region. No evidence of urinary tract calculus or hydronephrosis. The urinary bladder is decompressed without apparent focal abnormality. Stomach/Bowel: There is enteric contrast within the distal small bowel and colon, extending to the level of the descending colon. The stomach appears unremarkable for its degree of distention. There is no significant small bowel distension, wall thickening or surrounding inflammation. Status post appendectomy. No acute findings are seen in the proximal colon. Diverticulosis and wall thickening of the descending colon again noted with an enlarging collection of gas and fluid inferior to the sigmoid colon, measuring 4.7 x 3.7 cm on image 90/2. This collection appears more organized than on the previous study with mild peripheral enhancement, suspicious for developing abscess. No contrast extravasation. A rectal tube is in place. Vascular/Lymphatic: There are no enlarged abdominal or pelvic lymph nodes. Aortic and branch vessel atherosclerosis without evidence of aneurysm or large vessel occlusion. The portal, superior mesenteric and splenic veins are patent. There are small gastric varices. Reproductive: Status post hysterectomy. No evidence of adnexal mass. Other: Interval development of a moderate amount of ascites throughout the peritoneal cavity. This fluid appears simple, measuring water density. There are no dependent high density components, extravasated enteric contrast or other focal fluid collection. No pneumoperitoneum. Interval increased mesenteric and subcutaneous edema. No focal abdominal wall hernia. Musculoskeletal: Stable known sclerotic metastasis  involving the left iliac bone. Possible small metastasis within the right ischium. No progressive osseous metastatic disease or acute osseous findings identified. Old fracture of the right 6th rib laterally. There is multilevel spondylosis with prominent posterior osteophytes at L1-2 and L3-4. IMPRESSION: 1. Enlarging collection of gas and fluid inferior to the sigmoid colon, suspicious for developing abscess. No evidence of contrast extravasation or pneumoperitoneum. 2. Interval development of a moderate amount of ascites throughout the peritoneal cavity. Although nonspecific, this may relate to the patient's cirrhosis and/or reactive ascites from the diverticulitis. No other focal fluid collections are identified. 3. Chronic morphologic changes of cirrhosis with small gastric varices. 4. Stable appearance of residual treated nodule at the right lung base. 5. Stable osseous metastatic disease. No progressive osseous metastatic disease identified. 6. Coronary and aortic Atherosclerosis (ICD10-I70.0). Electronically Signed   By: Elmon Hagedorn M.D.   On: 06/05/2023 15:55    Anti-infectives: Anti-infectives (From admission, onward)    Start     Dose/Rate Route Frequency Ordered Stop   06/06/23 2200  metroNIDAZOLE  (FLAGYL ) tablet 500 mg        500 mg Oral Every 12 hours 06/06/23 0951     05/31/23 1600  vancomycin  (VANCOREADY) IVPB 1250 mg/250 mL  Status:  Discontinued        1,250 mg 166.7 mL/hr over 90 Minutes Intravenous Every  48 hours 05/29/23 1437 05/30/23 1416   05/31/23 1445  fidaxomicin  (DIFICID ) tablet 200 mg        200 mg Oral 2 times daily 05/31/23 1353 06/10/23 0959   05/31/23 1345  vancomycin  (VANCOCIN ) capsule 125 mg  Status:  Discontinued        125 mg Oral Daily 05/31/23 1253 05/31/23 1353   05/31/23 1000  fidaxomicin  (DIFICID ) tablet 200 mg  Status:  Discontinued        200 mg Oral 2 times daily 05/31/23 0812 05/31/23 0813   05/31/23 1000  metroNIDAZOLE  (FLAGYL ) IVPB 500 mg  Status:   Discontinued        500 mg 100 mL/hr over 60 Minutes Intravenous Every 12 hours 05/31/23 0813 06/06/23 0951   05/29/23 2300  cefTRIAXone  (ROCEPHIN ) 2 g in sodium chloride  0.9 % 100 mL IVPB        2 g 200 mL/hr over 30 Minutes Intravenous Every 24 hours 05/29/23 2211     05/29/23 1700  ceFEPIme  (MAXIPIME ) 2 g in sodium chloride  0.9 % 100 mL IVPB  Status:  Discontinued        2 g 200 mL/hr over 30 Minutes Intravenous Every 12 hours 05/29/23 1437 05/29/23 2211   05/29/23 1600  metroNIDAZOLE  (FLAGYL ) IVPB 500 mg  Status:  Discontinued        500 mg 100 mL/hr over 60 Minutes Intravenous Every 12 hours 05/29/23 1511 05/31/23 0812   05/29/23 1500  vancomycin  (VANCOREADY) IVPB 2000 mg/400 mL        2,000 mg 200 mL/hr over 120 Minutes Intravenous  Once 05/29/23 1437 05/29/23 1724   05/29/23 0430  ceFEPIme  (MAXIPIME ) 2 g in sodium chloride  0.9 % 100 mL IVPB        2 g 200 mL/hr over 30 Minutes Intravenous  Once 05/29/23 0416 05/29/23 0523        Assessment/Plan Diverticulitis with abscess and recurrent/persistent C. difficile colitis  - Repeat CT shows abscess that may now be amenable to drainage. IR consulted, needs PT/INR but platelets must be 50k minimum. As all counts are trending up hopefully will meet threshold in coming days. Remains poor surgical candidate, do not think she would tolerate an operation well, no evidence of emergent surgical interventions necessary for her C Diff. - Abx per ID - Okay to advance diet as tolerated but will need to be NPO once timing of IR drain determined. - We will follow with you   FEN - FLD; advance diet as tolerated VTE - SCDs, chem ppx on hold due to significant thrombocytopenia ID - Rocephin /Flagyl /Dificid  per ID. Afebrile. WBC 29.2 - did recently get Neupogen     Pancytopenia likely secondary to chemotherapy - cell lines improving, platelets 40k, need to be 50k prior to IR drain. Defer to primary to make decision whether needs transfusion versus  observation since they have been trending up E. coli bacteremia Cirrhosis Small cell lung cancer, currently on chemotherapy with metastatic disease Diabetes mellitus GERD History of renal cell cancer, status post left nephrectomy Hypertension Obesity Chronic kidney disease Hyperlipidemia  I reviewed nursing notes, hospitalist notes, last 24 h vitals and pain scores, last 48 h intake and output, last 24 h labs and trends, and last 24 h imaging results.   LOS: 8 days    Edmon Gosling, MD [facility] 06/06/2023, 11:39 AM Please see Amion for pager number during day hours 7:00am-4:30pm

## 2023-06-07 ENCOUNTER — Inpatient Hospital Stay (HOSPITAL_COMMUNITY)

## 2023-06-07 ENCOUNTER — Encounter (HOSPITAL_COMMUNITY): Payer: Self-pay | Admitting: Emergency Medicine

## 2023-06-07 DIAGNOSIS — A0472 Enterocolitis due to Clostridium difficile, not specified as recurrent: Secondary | ICD-10-CM | POA: Diagnosis not present

## 2023-06-07 DIAGNOSIS — T451X5A Adverse effect of antineoplastic and immunosuppressive drugs, initial encounter: Secondary | ICD-10-CM | POA: Diagnosis not present

## 2023-06-07 DIAGNOSIS — A4151 Sepsis due to Escherichia coli [E. coli]: Secondary | ICD-10-CM | POA: Diagnosis not present

## 2023-06-07 DIAGNOSIS — K5720 Diverticulitis of large intestine with perforation and abscess without bleeding: Secondary | ICD-10-CM | POA: Diagnosis not present

## 2023-06-07 DIAGNOSIS — D701 Agranulocytosis secondary to cancer chemotherapy: Secondary | ICD-10-CM | POA: Diagnosis not present

## 2023-06-07 LAB — COMPREHENSIVE METABOLIC PANEL WITH GFR
ALT: 31 U/L (ref 0–44)
AST: 36 U/L (ref 15–41)
Albumin: 2.3 g/dL — ABNORMAL LOW (ref 3.5–5.0)
Alkaline Phosphatase: 195 U/L — ABNORMAL HIGH (ref 38–126)
Anion gap: 6 (ref 5–15)
BUN: 25 mg/dL — ABNORMAL HIGH (ref 8–23)
CO2: 20 mmol/L — ABNORMAL LOW (ref 22–32)
Calcium: 9.6 mg/dL (ref 8.9–10.3)
Chloride: 111 mmol/L (ref 98–111)
Creatinine, Ser: 0.85 mg/dL (ref 0.44–1.00)
GFR, Estimated: >60 mL/min (ref >60)
Glucose, Bld: 108 mg/dL — ABNORMAL HIGH (ref 70–99)
Potassium: 3.8 mmol/L (ref 3.5–5.1)
Sodium: 137 mmol/L (ref 135–145)
Total Bilirubin: 0.5 mg/dL (ref 0.0–1.2)
Total Protein: 4.7 g/dL — ABNORMAL LOW (ref 6.5–8.1)

## 2023-06-07 LAB — CBC WITH DIFFERENTIAL/PLATELET
Abs Immature Granulocytes: 1.66 10*3/uL — ABNORMAL HIGH (ref 0.00–0.07)
Basophils Absolute: 0.1 10*3/uL (ref 0.0–0.1)
Basophils Relative: 1 %
Eosinophils Absolute: 0 10*3/uL (ref 0.0–0.5)
Eosinophils Relative: 0 %
HCT: 28.5 % — ABNORMAL LOW (ref 36.0–46.0)
Hemoglobin: 9.3 g/dL — ABNORMAL LOW (ref 12.0–15.0)
Immature Granulocytes: 10 %
Lymphocytes Relative: 12 %
Lymphs Abs: 2 10*3/uL (ref 0.7–4.0)
MCH: 33.6 pg (ref 26.0–34.0)
MCHC: 32.6 g/dL (ref 30.0–36.0)
MCV: 102.9 fL — ABNORMAL HIGH (ref 80.0–100.0)
Monocytes Absolute: 1.4 10*3/uL — ABNORMAL HIGH (ref 0.1–1.0)
Monocytes Relative: 8 %
Neutro Abs: 12.3 10*3/uL — ABNORMAL HIGH (ref 1.7–7.7)
Neutrophils Relative %: 69 %
Platelets: 42 10*3/uL — ABNORMAL LOW (ref 150–400)
RBC: 2.77 MIL/uL — ABNORMAL LOW (ref 3.87–5.11)
RDW: 18.5 % — ABNORMAL HIGH (ref 11.5–15.5)
Smear Review: NORMAL
WBC: 17.6 10*3/uL — ABNORMAL HIGH (ref 4.0–10.5)
nRBC: 0.7 % — ABNORMAL HIGH (ref 0.0–0.2)

## 2023-06-07 LAB — TYPE AND SCREEN
ABO/RH(D): A POS
Antibody Screen: NEGATIVE

## 2023-06-07 LAB — GLUCOSE, CAPILLARY
Glucose-Capillary: 97 mg/dL (ref 70–99)
Glucose-Capillary: 90 mg/dL (ref 70–99)
Glucose-Capillary: 85 mg/dL (ref 70–99)
Glucose-Capillary: 175 mg/dL — ABNORMAL HIGH (ref 70–99)

## 2023-06-07 LAB — MAGNESIUM: Magnesium: 1.9 mg/dL (ref 1.7–2.4)

## 2023-06-07 MED ORDER — SODIUM CHLORIDE 0.9% FLUSH
5.0000 mL | Freq: Three times a day (TID) | INTRAVENOUS | Status: DC
  Administered 2023-06-07 – 2023-06-17 (×27): 5 mL

## 2023-06-07 MED ORDER — FENTANYL CITRATE (PF) 100 MCG/2ML IJ SOLN
INTRAMUSCULAR | Status: AC | PRN
  Administered 2023-06-07: 50 ug via INTRAVENOUS

## 2023-06-07 MED ORDER — MIDAZOLAM HCL 2 MG/2ML IJ SOLN
INTRAMUSCULAR | Status: AC | PRN
  Administered 2023-06-07: 1 mg via INTRAVENOUS

## 2023-06-07 MED ORDER — MIDAZOLAM HCL 2 MG/2ML IJ SOLN
INTRAMUSCULAR | Status: AC
  Filled 2023-06-07: qty 4

## 2023-06-07 MED ORDER — SODIUM CHLORIDE 0.9 % IV BOLUS
INTRAVENOUS | Status: AC | PRN
  Administered 2023-06-07: 500 mL via INTRAVENOUS

## 2023-06-07 MED ORDER — SODIUM CHLORIDE 0.9% IV SOLUTION: Freq: Once | INTRAVENOUS | Status: AC

## 2023-06-07 MED ORDER — FENTANYL CITRATE (PF) 100 MCG/2ML IJ SOLN
INTRAMUSCULAR | Status: AC
  Filled 2023-06-07: qty 4

## 2023-06-07 NOTE — Procedures (Signed)
 Pre procedural Dx: diverticular abscess Post procedural Dx: Same  Technically successful CT guided placed of a 10 Fr drainage catheter placement into the abdominal abscess yielding pus.    A representative aspirated sample was capped and sent to the laboratory for analysis.    EBL: Trace Complications: None immediate  Zettie Hillock, MD Pager #: [phone]

## 2023-06-07 NOTE — Progress Notes (Signed)
 I think that the problem right now is this developing abscess in the left lower quadrant of the abdomen.  She had a CT scan done a couple days ago which showed this.  Surgery is somewhat hesitant to do anything which I totally understand.  I do not know if IR can put a drain in.  I know that her platelet count has been on the low side.  If needed, she can always get platelets before any procedure.  She says that the diarrhea is getting better.  Her labs show sodium 137.  Potassium 3.8.  BUN 25 creatinine 0.85.  Calcium  9.6 with an albumin of 2.3.  White cell count is 17.6.  Hemoglobin 9.3.  Platelet count 42,000.  She has had no bleeding.  She has had no cough or shortness of breath.  I am not sure how much she is out of bed.  She is having some discomfort in the left lower quadrant of the abdomen.  I am unsure if she is eating all that much.  On the CT scan, it did show some ascites.  I had to believe this is all reactive.  On her exam, temperature 98.4.  Pulse 109.  Blood pressure 142/70.  Her abdominal exam is soft.  Bowel sounds are present.  She has some tenderness in the left lower quadrant of the abdomen.  She has no obvious fluid wave.  There is no palpable abdominal mass.  Her lungs sound relatively clear bilaterally.  She has good air movement bilaterally.  Cardiac exam tachycardic but regular.  Extremities shows some mild trace edema in the lower legs.  Skin exam shows no rashes.  Neurological exam is nonfocal.    Amanda Crawford has small cell lung cancer.  She has responded to treatment.  She came in with diverticulitis.  She had C. difficile.  She had E. coli in her blood.  I really think that the E. coli is treated.  She has not neutropenic.  She is off colony stimulating factors.  The issue now is this abscess.  I suspect that IR will try to put a drain in today.  Her immune system is adequate at this point.  Again I suspect her platelets will come up.  I think having the  bacteremia from a gram-negative rod certainly delays the recovery.    Rayleen Cal, MD  [place] 1:37

## 2023-06-07 NOTE — Sedation Documentation (Signed)
NS Bolus started

## 2023-06-07 NOTE — Progress Notes (Addendum)
 PROGRESS NOTE    Amanda Crawford  ION:[id number] DOB: 12-07-1945 DOA: 05/29/2023 PCP: Neda Balk, MD   Brief Narrative: Amanda Crawford is a 78 y.o. female with a history of small cell lung cancer on chemotherapy, renal cell cancer with lung metastasis status post wedge resection, CKD stage III, diabetes mellitus, C. difficile, diverticulitis.  Patient presented secondary to weakness, confusion, neutropenia with evidence of sepsis secondary to sigmoid diverticulitis with abscess,  recurrent C. difficile colitis and E. coli bacteremia.  General surgery consulted for diverticulitis with abscess and ID consulted for E. coli bacteremia and C. difficile colitis management. Repeat CT shows worsening abscess with concern for possible free air.   Assessment and Plan:  Sepsis Present on admission.  Secondary to sigmoid diverticulitis with associated abscess in addition to E. coli bacteremia.  Complicated by immunocompromise status.  Sigmoid diverticulitis and abscess General Surgery consulted.  Recommendation for percutaneous drain per IR which was deferred initially.  ID consulted with antibiotic regimen recommendations.  Repeat CT abdomen/pelvis significant for enlarging abscess and continue recommendation for IR drain placement.  Significantly elevated white blood cell count after Neupogen ; unclear this is related to infection; now trended down. - IR recommendations for drain: Deferred on 5/7 secondary to platelets below 50,000. Plan for placement today. - Continue ceftriaxone  and Flagyl   C. difficile colitis ID consulted.  Patient management fidaxomicin . - ID recommendations: Fidaxomicin , started on 5/1  E. coli bacteremia Likely source is from diverticulitis/abscess.  Patient is on appropriate antibiotics as mentioned above.  Pancytopenia Secondary to chemotherapy treatment.  Associated neutropenia.  Patient given Neupogen  for management of neutropenia in setting of bacterial  infection.  Platelets improving. -Transfuse 1 unit platelets for IR drain attempt  Small cell lung cancer Currently on chemotherapy as an outpatient per oncology.  Diabetes mellitus type 2 Well controlled for age based on hemoglobin A1C of 7.6%. -Continue SSI and Semglee   CKD stage IIIb Baseline and stable.  Primary hypertension Coreg  held secondary to low blood pressure.  Cirrhosis of  the liver Newly diagnosed on CT imaging. Recommendation for outpatient GI follow-up.  Hyperlipidemia Patient's Lipitor held secondary to cirrhosis.  DVT prophylaxis: SCDs Code Status:   Code Status: Full Code Family Communication: None at bedside Disposition Plan: Discharge pending ongoing specialist recommendations/management and transition to outpatient antibiotics   Consultants:  General Surgery Infectious disease Interventional radiology  Procedures:    Antimicrobials:  Vancomycin  Flagyl  Fidaxomicin  Ceftriaxone  Cefepime    Subjective: No concerns this morning.  Objective: BP (!) 156/68 (BP Location: Right Arm)   Pulse (!) 101   Temp 97.8 F (36.6 C) (Oral)   Resp 18   Ht 5\' 4"  (1.626 m)   Wt 89.6 kg   SpO2 95%   BMI 33.90 kg/m   Examination:  General exam: Appears calm and comfortable Respiratory system: Clear to auscultation. Respiratory effort normal. Cardiovascular system: S1 & S2 heard, RRR. No murmurs, rubs, gallops or clicks. Gastrointestinal system: Abdomen is nondistended, soft. Normal bowel sounds heard. Central nervous system: Alert and oriented. Psychiatry: Judgement and insight appear normal. Mood & affect appropriate.    Data Reviewed: I have personally reviewed following labs and imaging studies  CBC Lab Results  Component Value Date   WBC 17.6 (H) 06/07/2023   RBC 2.77 (L) 06/07/2023   HGB 9.3 (L) 06/07/2023   HCT 28.5 (L) 06/07/2023   MCV 102.9 (H) 06/07/2023   MCH 33.6 06/07/2023   PLT 42 (L) 06/07/2023   MCHC 32.6 06/07/2023  RDW  18.5 (H) 06/07/2023   LYMPHSABS 2.0 06/07/2023   MONOABS 1.4 (H) 06/07/2023   EOSABS 0.0 06/07/2023   BASOSABS 0.1 06/07/2023     Last metabolic panel Lab Results  Component Value Date   NA 137 06/07/2023   K 3.8 06/07/2023   CL 111 06/07/2023   CO2 20 (L) 06/07/2023   BUN 25 (H) 06/07/2023   CREATININE 0.85 06/07/2023   GLUCOSE 108 (H) 06/07/2023   GFRNONAA >60 06/07/2023   GFRAA 44 (L) 06/05/2019   CALCIUM  9.6 06/07/2023   PHOS 1.6 (L) 06/03/2023   PROT 4.7 (L) 06/07/2023   ALBUMIN 2.3 (L) 06/07/2023   BILITOT 0.5 06/07/2023   ALKPHOS 195 (H) 06/07/2023   AST 36 06/07/2023   ALT 31 06/07/2023   ANIONGAP 6 06/07/2023    GFR: Estimated Creatinine Clearance: 57.3 mL/min (by C-G formula based on SCr of 0.85 mg/dL).  Recent Results (from the past 240 hours)  Blood culture (routine x 2)     Status: Abnormal   Collection Time: 05/29/23  4:17 AM   Specimen: BLOOD  Result Value Ref Range Status   Specimen Description   Final    BLOOD BLOOD RIGHT HAND Performed at [facility], [address] [place], [place] [address]    Special Requests   Final    BOTTLES DRAWN AEROBIC AND ANAEROBIC Blood Culture adequate volume Performed at [facility], [address] [place], [place] [address]    Culture  Setup Time   Final    GRAM NEGATIVE RODS AEROBIC BOTTLE ONLY CRITICAL RESULT CALLED TO, READ BACK BY AND VERIFIED WITH: PHARMD E. JACKSON [id number] @ 2203 FH Performed at [facility], [address] [place], [place] [address]    Culture ESCHERICHIA COLI (A)  Final   Report Status 05/31/2023 FINAL  Final   Organism ID, Bacteria ESCHERICHIA COLI  Final   Organism ID, Bacteria ESCHERICHIA COLI  Final      Susceptibility   Escherichia coli - KIRBY BAUER*    CEFAZOLIN  INTERMEDIATE Intermediate    Escherichia coli - MIC*    AMPICILLIN >=32 RESISTANT Resistant     CEFEPIME  <=0.12 SENSITIVE Sensitive     CEFTAZIDIME <=1 SENSITIVE Sensitive      CEFTRIAXONE  <=0.25 SENSITIVE Sensitive     CIPROFLOXACIN  >=4 RESISTANT Resistant     GENTAMICIN <=1 SENSITIVE Sensitive     IMIPENEM <=0.25 SENSITIVE Sensitive     TRIMETH /SULFA  <=20 SENSITIVE Sensitive     AMPICILLIN/SULBACTAM 8 SENSITIVE Sensitive     PIP/TAZO <=4 SENSITIVE Sensitive ug/mL    * ESCHERICHIA COLI    ESCHERICHIA COLI  Blood Culture ID Panel (Reflexed)     Status: Abnormal   Collection Time: 05/29/23  4:17 AM  Result Value Ref Range Status   Enterococcus faecalis NOT DETECTED NOT DETECTED Final   Enterococcus Faecium NOT DETECTED NOT DETECTED Final   Listeria monocytogenes NOT DETECTED NOT DETECTED Final   Staphylococcus species NOT DETECTED NOT DETECTED Final   Staphylococcus aureus (BCID) NOT DETECTED NOT DETECTED Final   Staphylococcus epidermidis NOT DETECTED NOT DETECTED Final   Staphylococcus lugdunensis NOT DETECTED NOT DETECTED Final   Streptococcus species NOT DETECTED NOT DETECTED Final   Streptococcus agalactiae NOT DETECTED NOT DETECTED Final   Streptococcus pneumoniae NOT DETECTED NOT DETECTED Final   Streptococcus pyogenes NOT DETECTED NOT DETECTED Final   A.calcoaceticus-baumannii NOT DETECTED NOT DETECTED Final   Bacteroides fragilis NOT DETECTED NOT DETECTED Final  Enterobacterales DETECTED (A) NOT DETECTED Final    Comment: Enterobacterales represent a large order of gram negative bacteria, not a single organism. CRITICAL RESULT CALLED TO, READ BACK BY AND VERIFIED WITH: PHARMD E. JACKSON [id number] @ 2203 FH    Enterobacter cloacae complex NOT DETECTED NOT DETECTED Final   Escherichia coli DETECTED (A) NOT DETECTED Final    Comment: CRITICAL RESULT CALLED TO, READ BACK BY AND VERIFIED WITH: PHARMD E. JACKSON [id number] @ 2203 FH    Klebsiella aerogenes NOT DETECTED NOT DETECTED Final   Klebsiella oxytoca NOT DETECTED NOT DETECTED Final   Klebsiella pneumoniae NOT DETECTED NOT DETECTED Final   Proteus species NOT DETECTED NOT DETECTED Final    Salmonella species NOT DETECTED NOT DETECTED Final   Serratia marcescens NOT DETECTED NOT DETECTED Final   Haemophilus influenzae NOT DETECTED NOT DETECTED Final   Neisseria meningitidis NOT DETECTED NOT DETECTED Final   Pseudomonas aeruginosa NOT DETECTED NOT DETECTED Final   Stenotrophomonas maltophilia NOT DETECTED NOT DETECTED Final   Candida albicans NOT DETECTED NOT DETECTED Final   Candida auris NOT DETECTED NOT DETECTED Final   Candida glabrata NOT DETECTED NOT DETECTED Final   Candida krusei NOT DETECTED NOT DETECTED Final   Candida parapsilosis NOT DETECTED NOT DETECTED Final   Candida tropicalis NOT DETECTED NOT DETECTED Final   Cryptococcus neoformans/gattii NOT DETECTED NOT DETECTED Final   CTX-M ESBL NOT DETECTED NOT DETECTED Final   Carbapenem resistance IMP NOT DETECTED NOT DETECTED Final   Carbapenem resistance KPC NOT DETECTED NOT DETECTED Final   Carbapenem resistance NDM NOT DETECTED NOT DETECTED Final   Carbapenem resist OXA 48 LIKE NOT DETECTED NOT DETECTED Final   Carbapenem resistance VIM NOT DETECTED NOT DETECTED Final    Comment: Performed at [facility], [address], [place], [place] [address]  Blood culture (routine x 2)     Status: Abnormal   Collection Time: 05/29/23  4:22 AM   Specimen: BLOOD  Result Value Ref Range Status   Specimen Description   Final    BLOOD LEFT ANTECUBITAL Performed at [facility], [address] [place], [place] [address]    Special Requests   Final    BOTTLES DRAWN AEROBIC AND ANAEROBIC Blood Culture adequate volume Performed at [facility], [address] [place], [place] [address]    Culture  Setup Time   Final    GRAM NEGATIVE RODS IN BOTH AEROBIC AND ANAEROBIC BOTTLES CRITICAL VALUE NOTED.  VALUE IS CONSISTENT WITH PREVIOUSLY REPORTED AND CALLED VALUE.    Culture (A)  Final    ESCHERICHIA COLI SUSCEPTIBILITIES PERFORMED ON PREVIOUS CULTURE WITHIN THE LAST 5 DAYS. Performed at [facility], [address], [place], [place] [address]    Report Status 05/31/2023 FINAL  Final  C Difficile Quick Screen w PCR reflex     Status: Abnormal   Collection Time: 05/30/23 12:45 PM   Specimen: STOOL  Result Value Ref Range Status   C Diff antigen POSITIVE (A) NEGATIVE Final   C Diff toxin NEGATIVE NEGATIVE Final   C Diff interpretation Results are indeterminate. See PCR results.  Final    Comment: Performed at [facility], [address], [place], [place] [address]  C. Diff by PCR, Reflexed     Status: Abnormal   Collection Time: 05/30/23 12:45 PM  Result Value Ref Range Status   Toxigenic C. Difficile by PCR POSITIVE (A) NEGATIVE Final  Comment: Positive for toxigenic C. difficile with little to no toxin production. Only treat if clinical presentation suggests symptomatic illness. Performed at [facility], [address], [place], [place] [address]   Culture, blood (Routine X 2) w Reflex to ID Panel     Status: None   Collection Time: 05/31/23  1:32 PM   Specimen: BLOOD RIGHT ARM  Result Value Ref Range Status   Specimen Description   Final    BLOOD RIGHT ARM Performed at [facility], [address], [place], [place] [address]    Special Requests   Final    BOTTLES DRAWN AEROBIC AND ANAEROBIC Blood Culture results may not be optimal due to an inadequate volume of blood received in culture bottles Performed at [facility], [address] [place], [place] [address]    Culture   Final    NO GROWTH 5 DAYS Performed at [facility], [address], [place], [place] [address]    Report Status 06/05/2023 FINAL  Final  Culture, blood (Routine X 2) w Reflex to ID Panel     Status: None   Collection Time: 05/31/23  1:39 PM   Specimen: BLOOD LEFT ARM  Result Value Ref Range Status   Specimen Description   Final    BLOOD LEFT ARM Performed at [facility], [address] [place], [place] [address]     Special Requests   Final    BOTTLES DRAWN AEROBIC AND ANAEROBIC Blood Culture results may not be optimal due to an inadequate volume of blood received in culture bottles Performed at [facility], [address], [place], [place] [address]    Culture   Final    NO GROWTH 5 DAYS Performed at [facility], [address] [place], [place] [address]    Report Status 06/05/2023 FINAL  Final      Radiology Studies: CT ABDOMEN PELVIS W CONTRAST Result Date: 06/05/2023 CLINICAL DATA:  Metastatic small cell lung cancer with recent perforated diverticulitis and small peridiverticular abscess. Diarrhea. EXAM: CT ABDOMEN AND PELVIS WITH CONTRAST TECHNIQUE: Multidetector CT imaging of the abdomen and pelvis was performed using the standard protocol following bolus administration of intravenous contrast. RADIATION DOSE REDUCTION: This exam was performed according to the departmental dose-optimization program which includes automated exposure control, adjustment of the mA and/or kV according to patient size and/or use of iterative reconstruction technique. CONTRAST:  OMNIPAQUE  IOHEXOL  300 MG/ML  SOLN COMPARISON:  Abdominopelvic CT 05/30/2023.  PET-CT 05/14/2023. FINDINGS: Lower chest: Similar appearance of residual treated nodule at the right lung base, measuring approximately 1.9 x 1.5 cm on image 24/4. There is atelectasis or scarring at both lung bases. No significant pleural or pericardial effusion. Central venous catheter projects to the superior cavoatrial junction. There is aortic and coronary artery atherosclerosis. Distal esophageal wall thickening noted. Hepatobiliary: Chronic morphologic changes of cirrhosis. No suspicious focal liver lesion or biliary dilatation identified status post cholecystectomy. Pancreas: Unremarkable. No pancreatic ductal dilatation or surrounding inflammatory changes. Spleen: Normal in size without focal abnormality. Adrenals/Urinary Tract: Both  adrenal glands appear normal. Previous left nephrectomy with stable appearance of the nephrectomy bed. The right kidney has a stable appearance with scarring in the lateral interpolar region. No evidence of urinary tract calculus or hydronephrosis. The urinary bladder is decompressed without apparent focal abnormality. Stomach/Bowel: There is enteric contrast within the distal small bowel and colon, extending to the level of the descending  colon. The stomach appears unremarkable for its degree of distention. There is no significant small bowel distension, wall thickening or surrounding inflammation. Status post appendectomy. No acute findings are seen in the proximal colon. Diverticulosis and wall thickening of the descending colon again noted with an enlarging collection of gas and fluid inferior to the sigmoid colon, measuring 4.7 x 3.7 cm on image 90/2. This collection appears more organized than on the previous study with mild peripheral enhancement, suspicious for developing abscess. No contrast extravasation. A rectal tube is in place. Vascular/Lymphatic: There are no enlarged abdominal or pelvic lymph nodes. Aortic and branch vessel atherosclerosis without evidence of aneurysm or large vessel occlusion. The portal, superior mesenteric and splenic veins are patent. There are small gastric varices. Reproductive: Status post hysterectomy. No evidence of adnexal mass. Other: Interval development of a moderate amount of ascites throughout the peritoneal cavity. This fluid appears simple, measuring water  density. There are no dependent high density components, extravasated enteric contrast or other focal fluid collection. No pneumoperitoneum. Interval increased mesenteric and subcutaneous edema. No focal abdominal wall hernia. Musculoskeletal: Stable known sclerotic metastasis involving the left iliac bone. Possible small metastasis within the right ischium. No progressive osseous metastatic disease or acute  osseous findings identified. Old fracture of the right 6th rib laterally. There is multilevel spondylosis with prominent posterior osteophytes at L1-2 and L3-4. IMPRESSION: 1. Enlarging collection of gas and fluid inferior to the sigmoid colon, suspicious for developing abscess. No evidence of contrast extravasation or pneumoperitoneum. 2. Interval development of a moderate amount of ascites throughout the peritoneal cavity. Although nonspecific, this may relate to the patient's cirrhosis and/or reactive ascites from the diverticulitis. No other focal fluid collections are identified. 3. Chronic morphologic changes of cirrhosis with small gastric varices. 4. Stable appearance of residual treated nodule at the right lung base. 5. Stable osseous metastatic disease. No progressive osseous metastatic disease identified. 6. Coronary and aortic Atherosclerosis (ICD10-I70.0). Electronically Signed   By: Elmon Hagedorn M.D.   On: 06/05/2023 15:55      LOS: 9 days    Aneita Keens, MD [facility] 06/07/2023, 10:15 AM   If 7PM-7AM, please contact night-coverage [email or web address]

## 2023-06-07 NOTE — Plan of Care (Signed)

## 2023-06-07 NOTE — Progress Notes (Signed)
 Subjective: CC: Stable to slightly worse llq abdominal pain. Still with generalized crampy abdominal pain after po intake yesterday. No n/v. Having bm's - output decreasing.    Afebrile. HR 101. No hypotension. WBC 17.6 (29.2).  Objective: Vital signs in last 24 hours: Temp:  [97.7 F (36.5 C)-98.4 F (36.9 C)] 97.8 F (36.6 C) (05/08 0845) Pulse Rate:  [97-109] 101 (05/08 0845) Resp:  [16-20] 18 (05/08 0845) BP: (118-156)/(53-70) 156/68 (05/08 0845) SpO2:  [94 %-95 %] 95 % (05/08 0845) Last BM Date : 06/07/23  Intake/Output from previous day: 05/07 0701 - 05/08 0700 In: 610 [P.O.:600; I.V.:10] Out: 250 [Stool:250] Intake/Output this shift: Total I/O In: 227.7 [I.V.:27.7; IV Piggyback:200] Out: -   PE: Gen:  Alert, NAD, pleasant Abd: Soft, mild distension, generalized ttp that is greatest in the LLQ without rigidity or guarding, +BS  Lab Results:  Recent Labs    06/06/23 0542 06/06/23 1200 06/07/23 0525  WBC 29.2*  --  17.6*  HGB 8.9*  --  9.3*  HCT 27.9*  --  28.5*  PLT 40* 45* 42*   BMET Recent Labs    06/06/23 0542 06/07/23 0525  NA 131* 137  K 3.9 3.8  CL 107 111  CO2 18* 20*  GLUCOSE 143* 108*  BUN 22 25*  CREATININE 0.84 0.85  CALCIUM  9.7 9.6   PT/INR Recent Labs    06/06/23 1000  LABPROT 16.8*  INR 1.3*   CMP     Component Value Date/Time   NA 137 06/07/2023 0525   NA 139 08/23/2017 0933   NA 140 11/01/2016 0818   K 3.8 06/07/2023 0525   K 4.4 11/01/2016 0818   CL 111 06/07/2023 0525   CL 107 04/16/2012 0923   CO2 20 (L) 06/07/2023 0525   CO2 26 11/01/2016 0818   GLUCOSE 108 (H) 06/07/2023 0525   GLUCOSE 141 (H) 11/01/2016 0818   GLUCOSE 115 (H) 04/16/2012 0923   BUN 25 (H) 06/07/2023 0525   BUN 24 08/23/2017 0933   BUN 17.9 11/01/2016 0818   CREATININE 0.85 06/07/2023 0525   CREATININE 1.59 (H) 05/21/2023 1003   CREATININE 1.1 11/01/2016 0818   CALCIUM  9.6 06/07/2023 0525   CALCIUM  10.3 11/01/2016 0818   PROT 4.7  (L) 06/07/2023 0525   PROT 7.8 11/01/2016 0818   ALBUMIN 2.3 (L) 06/07/2023 0525   ALBUMIN 3.8 11/01/2016 0818   AST 36 06/07/2023 0525   AST 48 (H) 05/21/2023 1003   AST 75 (H) 11/01/2016 0818   ALT 31 06/07/2023 0525   ALT 34 05/21/2023 1003   ALT 75 (H) 11/01/2016 0818   ALKPHOS 195 (H) 06/07/2023 0525   ALKPHOS 72 11/01/2016 0818   BILITOT 0.5 06/07/2023 0525   BILITOT 0.7 05/21/2023 1003   BILITOT 0.78 11/01/2016 0818   GFRNONAA >60 06/07/2023 0525   GFRNONAA 33 (L) 05/21/2023 1003   GFRAA 44 (L) 06/05/2019 0933   Lipase     Component Value Date/Time   LIPASE 67 (H) 05/29/2023 0303    Studies/Results: CT ABDOMEN PELVIS W CONTRAST Result Date: 06/05/2023 CLINICAL DATA:  Metastatic small cell lung cancer with recent perforated diverticulitis and small peridiverticular abscess. Diarrhea. EXAM: CT ABDOMEN AND PELVIS WITH CONTRAST TECHNIQUE: Multidetector CT imaging of the abdomen and pelvis was performed using the standard protocol following bolus administration of intravenous contrast. RADIATION DOSE REDUCTION: This exam was performed according to the departmental dose-optimization program which includes automated exposure control, adjustment of the  mA and/or kV according to patient size and/or use of iterative reconstruction technique. CONTRAST:  OMNIPAQUE  IOHEXOL  300 MG/ML  SOLN COMPARISON:  Abdominopelvic CT 05/30/2023.  PET-CT 05/14/2023. FINDINGS: Lower chest: Similar appearance of residual treated nodule at the right lung base, measuring approximately 1.9 x 1.5 cm on image 24/4. There is atelectasis or scarring at both lung bases. No significant pleural or pericardial effusion. Central venous catheter projects to the superior cavoatrial junction. There is aortic and coronary artery atherosclerosis. Distal esophageal wall thickening noted. Hepatobiliary: Chronic morphologic changes of cirrhosis. No suspicious focal liver lesion or biliary dilatation identified status post  cholecystectomy. Pancreas: Unremarkable. No pancreatic ductal dilatation or surrounding inflammatory changes. Spleen: Normal in size without focal abnormality. Adrenals/Urinary Tract: Both adrenal glands appear normal. Previous left nephrectomy with stable appearance of the nephrectomy bed. The right kidney has a stable appearance with scarring in the lateral interpolar region. No evidence of urinary tract calculus or hydronephrosis. The urinary bladder is decompressed without apparent focal abnormality. Stomach/Bowel: There is enteric contrast within the distal small bowel and colon, extending to the level of the descending colon. The stomach appears unremarkable for its degree of distention. There is no significant small bowel distension, wall thickening or surrounding inflammation. Status post appendectomy. No acute findings are seen in the proximal colon. Diverticulosis and wall thickening of the descending colon again noted with an enlarging collection of gas and fluid inferior to the sigmoid colon, measuring 4.7 x 3.7 cm on image 90/2. This collection appears more organized than on the previous study with mild peripheral enhancement, suspicious for developing abscess. No contrast extravasation. A rectal tube is in place. Vascular/Lymphatic: There are no enlarged abdominal or pelvic lymph nodes. Aortic and branch vessel atherosclerosis without evidence of aneurysm or large vessel occlusion. The portal, superior mesenteric and splenic veins are patent. There are small gastric varices. Reproductive: Status post hysterectomy. No evidence of adnexal mass. Other: Interval development of a moderate amount of ascites throughout the peritoneal cavity. This fluid appears simple, measuring water density. There are no dependent high density components, extravasated enteric contrast or other focal fluid collection. No pneumoperitoneum. Interval increased mesenteric and subcutaneous edema. No focal abdominal wall hernia.  Musculoskeletal: Stable known sclerotic metastasis involving the left iliac bone. Possible small metastasis within the right ischium. No progressive osseous metastatic disease or acute osseous findings identified. Old fracture of the right 6th rib laterally. There is multilevel spondylosis with prominent posterior osteophytes at L1-2 and L3-4. IMPRESSION: 1. Enlarging collection of gas and fluid inferior to the sigmoid colon, suspicious for developing abscess. No evidence of contrast extravasation or pneumoperitoneum. 2. Interval development of a moderate amount of ascites throughout the peritoneal cavity. Although nonspecific, this may relate to the patient's cirrhosis and/or reactive ascites from the diverticulitis. No other focal fluid collections are identified. 3. Chronic morphologic changes of cirrhosis with small gastric varices. 4. Stable appearance of residual treated nodule at the right lung base. 5. Stable osseous metastatic disease. No progressive osseous metastatic disease identified. 6. Coronary and aortic Atherosclerosis (ICD10-I70.0). Electronically Signed   By: Elmon Hagedorn M.D.   On: 06/05/2023 15:55    Anti-infectives: Anti-infectives (From admission, onward)    Start     Dose/Rate Route Frequency Ordered Stop   06/06/23 2200  metroNIDAZOLE  (FLAGYL ) tablet 500 mg        500 mg Oral Every 12 hours 06/06/23 0951     05/31/23 1600  vancomycin  (VANCOREADY) IVPB 1250 mg/250 mL  Status:  Discontinued        1,250 mg 166.7 mL/hr over 90 Minutes Intravenous Every 48 hours 05/29/23 1437 05/30/23 1416   05/31/23 1445  fidaxomicin  (DIFICID ) tablet 200 mg        200 mg Oral 2 times daily 05/31/23 1353 06/10/23 0959   05/31/23 1345  vancomycin  (VANCOCIN ) capsule 125 mg  Status:  Discontinued        125 mg Oral Daily 05/31/23 1253 05/31/23 1353   05/31/23 1000  fidaxomicin  (DIFICID ) tablet 200 mg  Status:  Discontinued        200 mg Oral 2 times daily 05/31/23 0812 05/31/23 0813   05/31/23  1000  metroNIDAZOLE  (FLAGYL ) IVPB 500 mg  Status:  Discontinued        500 mg 100 mL/hr over 60 Minutes Intravenous Every 12 hours 05/31/23 0813 06/06/23 0951   05/29/23 2300  cefTRIAXone  (ROCEPHIN ) 2 g in sodium chloride  0.9 % 100 mL IVPB        2 g 200 mL/hr over 30 Minutes Intravenous Every 24 hours 05/29/23 2211     05/29/23 1700  ceFEPIme  (MAXIPIME ) 2 g in sodium chloride  0.9 % 100 mL IVPB  Status:  Discontinued        2 g 200 mL/hr over 30 Minutes Intravenous Every 12 hours 05/29/23 1437 05/29/23 2211   05/29/23 1600  metroNIDAZOLE  (FLAGYL ) IVPB 500 mg  Status:  Discontinued        500 mg 100 mL/hr over 60 Minutes Intravenous Every 12 hours 05/29/23 1511 05/31/23 0812   05/29/23 1500  vancomycin  (VANCOREADY) IVPB 2000 mg/400 mL        2,000 mg 200 mL/hr over 120 Minutes Intravenous  Once 05/29/23 1437 05/29/23 1724   05/29/23 0430  ceFEPIme  (MAXIPIME ) 2 g in sodium chloride  0.9 % 100 mL IVPB        2 g 200 mL/hr over 30 Minutes Intravenous  Once 05/29/23 0416 05/29/23 0523        Assessment/Plan Diverticulitis with abscess and recurrent/persistent C. difficile colitis  - Repeat CT shows abscess that may now be amenable to drainage. IR consulted, needs PT/INR but platelets must be 50k minimum. As all counts are trending up hopefully will meet threshold in coming days. Remains poor surgical candidate, do not think she would tolerate an operation well, no evidence of emergent surgical interventions necessary for her C Diff. - Abx per ID - Okay to advance diet as tolerated but will need to be NPO once timing of IR drain determined. - We will follow with you   FEN - As above.  VTE - SCDs, chem ppx on hold due to significant thrombocytopenia ID - Rocephin /Flagyl /Dificid  per ID. Afebrile. WBC 17.6 (29.2) - did recently get Neupogen     Pancytopenia likely secondary to chemotherapy - cell lines improving, platelets 42k, need to be 50k prior to IR drain. Getting platelet transfusion  this am.  E. coli bacteremia Cirrhosis Small cell lung cancer, currently on chemotherapy with metastatic disease Diabetes mellitus GERD History of renal cell cancer, status post left nephrectomy Hypertension Obesity Chronic kidney disease Hyperlipidemia  I reviewed nursing notes, Consultant (ID, IR) notes, hospitalist notes, last 24 h vitals and pain scores, last 48 h intake and output, last 24 h labs and trends, and last 24 h imaging results.   LOS: 9 days    Delton Filbert, [facility] 06/07/2023, 10:01 AM Please see Amion for pager number during day hours 7:00am-4:30pm

## 2023-06-08 DIAGNOSIS — A0472 Enterocolitis due to Clostridium difficile, not specified as recurrent: Secondary | ICD-10-CM | POA: Diagnosis not present

## 2023-06-08 DIAGNOSIS — K5720 Diverticulitis of large intestine with perforation and abscess without bleeding: Secondary | ICD-10-CM | POA: Diagnosis not present

## 2023-06-08 DIAGNOSIS — A4151 Sepsis due to Escherichia coli [E. coli]: Secondary | ICD-10-CM | POA: Diagnosis not present

## 2023-06-08 LAB — COMPREHENSIVE METABOLIC PANEL WITH GFR
ALT: 30 U/L (ref 0–44)
AST: 33 U/L (ref 15–41)
Albumin: 2.4 g/dL — ABNORMAL LOW (ref 3.5–5.0)
Alkaline Phosphatase: 190 U/L — ABNORMAL HIGH (ref 38–126)
Anion gap: 7 (ref 5–15)
BUN: 21 mg/dL (ref 8–23)
CO2: 21 mmol/L — ABNORMAL LOW (ref 22–32)
Calcium: 9.4 mg/dL (ref 8.9–10.3)
Chloride: 110 mmol/L (ref 98–111)
Creatinine, Ser: 0.89 mg/dL (ref 0.44–1.00)
GFR, Estimated: >60 mL/min (ref >60)
Glucose, Bld: 136 mg/dL — ABNORMAL HIGH (ref 70–99)
Potassium: 3.8 mmol/L (ref 3.5–5.1)
Sodium: 138 mmol/L (ref 135–145)
Total Bilirubin: 0.4 mg/dL (ref 0.0–1.2)
Total Protein: 4.7 g/dL — ABNORMAL LOW (ref 6.5–8.1)

## 2023-06-08 LAB — CBC WITH DIFFERENTIAL/PLATELET
Abs Immature Granulocytes: 1.56 10*3/uL — ABNORMAL HIGH (ref 0.00–0.07)
Basophils Absolute: 0.1 10*3/uL (ref 0.0–0.1)
Basophils Relative: 1 %
Eosinophils Absolute: 0 10*3/uL (ref 0.0–0.5)
Eosinophils Relative: 0 %
HCT: 31.4 % — ABNORMAL LOW (ref 36.0–46.0)
Hemoglobin: 9.8 g/dL — ABNORMAL LOW (ref 12.0–15.0)
Immature Granulocytes: 9 %
Lymphocytes Relative: 10 %
Lymphs Abs: 1.6 10*3/uL (ref 0.7–4.0)
MCH: 33 pg (ref 26.0–34.0)
MCHC: 31.2 g/dL (ref 30.0–36.0)
MCV: 105.7 fL — ABNORMAL HIGH (ref 80.0–100.0)
Monocytes Absolute: 1 10*3/uL (ref 0.1–1.0)
Monocytes Relative: 6 %
Neutro Abs: 12.7 10*3/uL — ABNORMAL HIGH (ref 1.7–7.7)
Neutrophils Relative %: 74 %
Platelets: 69 10*3/uL — ABNORMAL LOW (ref 150–400)
RBC: 2.97 MIL/uL — ABNORMAL LOW (ref 3.87–5.11)
RDW: 19.4 % — ABNORMAL HIGH (ref 11.5–15.5)
WBC: 17.1 10*3/uL — ABNORMAL HIGH (ref 4.0–10.5)
nRBC: 0.5 % — ABNORMAL HIGH (ref 0.0–0.2)

## 2023-06-08 LAB — GLUCOSE, CAPILLARY
Glucose-Capillary: 138 mg/dL — ABNORMAL HIGH (ref 70–99)
Glucose-Capillary: 167 mg/dL — ABNORMAL HIGH (ref 70–99)
Glucose-Capillary: 154 mg/dL — ABNORMAL HIGH (ref 70–99)
Glucose-Capillary: 131 mg/dL — ABNORMAL HIGH (ref 70–99)

## 2023-06-08 LAB — BPAM PLATELET PHERESIS
Blood Product Expiration Date: 202505102359
ISSUE DATE / TIME: 202505080809
Unit Type and Rh: 5100

## 2023-06-08 LAB — PREPARE PLATELET PHERESIS: Unit division: 0

## 2023-06-08 LAB — QUANTIFERON-TB GOLD PLUS (RQFGPL)
QuantiFERON Mitogen Value: 0.12 [IU]/mL
QuantiFERON Nil Value: 0.05 [IU]/mL
QuantiFERON TB1 Ag Value: 0.05 [IU]/mL
QuantiFERON TB2 Ag Value: 0.05 [IU]/mL

## 2023-06-08 LAB — QUANTIFERON-TB GOLD PLUS: QuantiFERON-TB Gold Plus: UNDETERMINED — AB

## 2023-06-08 LAB — MAGNESIUM: Magnesium: 1.9 mg/dL (ref 1.7–2.4)

## 2023-06-08 NOTE — Plan of Care (Signed)

## 2023-06-08 NOTE — Progress Notes (Signed)
 Subjective: CC: Reports feeling sore where the drain is, otherwise stable. Denies nausea/vomiting. Rectal tube in place.   Afebrile. HR 100. No hypotension. WBC 17.1 (17.6).  Objective: Vital signs in last 24 hours: Temp:  [97.7 F (36.5 C)-98.7 F (37.1 C)] 97.7 F (36.5 C) (05/09 0537) Pulse Rate:  [97-102] 100 (05/08 1353) Resp:  [12-21] 16 (05/09 0537) BP: (90-156)/(36-68) 116/65 (05/09 0537) SpO2:  [93 %-100 %] 93 % (05/09 0537) Last BM Date : 06/07/23  Intake/Output from previous day: 05/08 0701 - 05/09 0700 In: 2064.2 [I.V.:527.7; Blood:336.5; IV Piggyback:1200] Out: 910 [Urine:425; Drains:285; Stool:200] Intake/Output this shift: No intake/output data recorded.  PE: Gen:  Alert, NAD, pleasant Abd: Soft, mild distension, generalized ttp that is greatest in the LLQ without rigidity or guarding, interval placement of JP LLQ draining cloudy SS fluid (>200 mL)  Lab Results:  Recent Labs    06/07/23 0525 06/08/23 0540  WBC 17.6* 17.1*  HGB 9.3* 9.8*  HCT 28.5* 31.4*  PLT 42* 69*   BMET Recent Labs    06/07/23 0525 06/08/23 0540  NA 137 138  K 3.8 3.8  CL 111 110  CO2 20* 21*  GLUCOSE 108* 136*  BUN 25* 21  CREATININE 0.85 0.89  CALCIUM  9.6 9.4   PT/INR Recent Labs    06/06/23 1000  LABPROT 16.8*  INR 1.3*   CMP     Component Value Date/Time   NA 138 06/08/2023 0540   NA 139 08/23/2017 0933   NA 140 11/01/2016 0818   K 3.8 06/08/2023 0540   K 4.4 11/01/2016 0818   CL 110 06/08/2023 0540   CL 107 04/16/2012 0923   CO2 21 (L) 06/08/2023 0540   CO2 26 11/01/2016 0818   GLUCOSE 136 (H) 06/08/2023 0540   GLUCOSE 141 (H) 11/01/2016 0818   GLUCOSE 115 (H) 04/16/2012 0923   BUN 21 06/08/2023 0540   BUN 24 08/23/2017 0933   BUN 17.9 11/01/2016 0818   CREATININE 0.89 06/08/2023 0540   CREATININE 1.59 (H) 05/21/2023 1003   CREATININE 1.1 11/01/2016 0818   CALCIUM  9.4 06/08/2023 0540   CALCIUM  10.3 11/01/2016 0818   PROT 4.7 (L)  06/08/2023 0540   PROT 7.8 11/01/2016 0818   ALBUMIN 2.4 (L) 06/08/2023 0540   ALBUMIN 3.8 11/01/2016 0818   AST 33 06/08/2023 0540   AST 48 (H) 05/21/2023 1003   AST 75 (H) 11/01/2016 0818   ALT 30 06/08/2023 0540   ALT 34 05/21/2023 1003   ALT 75 (H) 11/01/2016 0818   ALKPHOS 190 (H) 06/08/2023 0540   ALKPHOS 72 11/01/2016 0818   BILITOT 0.4 06/08/2023 0540   BILITOT 0.7 05/21/2023 1003   BILITOT 0.78 11/01/2016 0818   GFRNONAA >60 06/08/2023 0540   GFRNONAA 33 (L) 05/21/2023 1003   GFRAA 44 (L) 06/05/2019 0933   Lipase     Component Value Date/Time   LIPASE 67 (H) 05/29/2023 0303    Studies/Results: CT GUIDED PERITONEAL/RETROPERITONEAL FLUID DRAIN BY PERC CATH Result Date: 06/07/2023 INDICATION: Pelvic diverticular abscess EXAM: CT-guided drain placement TECHNIQUE: Multidetector CT imaging of the pelvis was performed following the standard protocol with/without IV contrast. RADIATION DOSE REDUCTION: This exam was performed according to the departmental dose-optimization program which includes automated exposure control, adjustment of the mA and/or kV according to patient size and/or use of iterative reconstruction technique. MEDICATIONS: The patient is currently admitted to the hospital and receiving intravenous antibiotics. The antibiotics were administered within an appropriate  time frame prior to the initiation of the procedure. ANESTHESIA/SEDATION: Moderate (conscious) sedation was employed during this procedure. A total of Versed  1 mg and Fentanyl  50 mcg was administered intravenously by the radiology nurse. Total intra-service moderate Sedation Time: 50 minutes. The patient's level of consciousness and vital signs were monitored continuously by radiology nursing throughout the procedure under my direct supervision. COMPLICATIONS: None immediate. PROCEDURE: Informed written consent was obtained from the patient after a thorough discussion of the procedural risks, benefits and  alternatives. All questions were addressed. Maximal Sterile Barrier Technique was utilized including caps, mask, sterile gowns, sterile gloves, sterile drape, hand hygiene and skin antiseptic. A timeout was performed prior to the initiation of the procedure. With the patient in a supine position, initial images of the pelvis were obtained. The abscess previously identified on the CT of the abdomen pelvis from Jun 05, 2023 was without significant change. Radiopaque markers were placed on the patient's pelvis and repeat imaging was performed in order to demarcate the access site. Patient's skin was then marked, prepped, and draped in the usual sterile fashion. The skin and deeper tissue was anesthetized with 1% lidocaine . Small incision was made the patient is scanned and the Yueh needle 15 cm was advanced sequentially into the sigmoid diverticular abscess. Once the needle tip was within the abscess, a guidewire was advanced and coiled within the abscess cavity. The access tract was then dilated with a 10 French fascial dilator. After the tract was dilated, a 10 French pigtail drainage catheter was advanced over the guidewire and coiled within the abscess. The guidewire was removed. Retention suture and sterile dressing were applied. The catheter was connected to a JP bulb and instructions on catheter flushing were placed in the patient's chart. IMPRESSION: Satisfactory CT-guided placement of a 10 French drainage catheter is sigmoid diverticular abscess. There appears to be a large communication between the diverticular abscess and the bowel. Electronically Signed   By: Susan Ensign   On: 06/07/2023 14:55    Anti-infectives: Anti-infectives (From admission, onward)    Start     Dose/Rate Route Frequency Ordered Stop   06/06/23 2200  metroNIDAZOLE  (FLAGYL ) tablet 500 mg        500 mg Oral Every 12 hours 06/06/23 0951     05/31/23 1600  vancomycin  (VANCOREADY) IVPB 1250 mg/250 mL  Status:  Discontinued         1,250 mg 166.7 mL/hr over 90 Minutes Intravenous Every 48 hours 05/29/23 1437 05/30/23 1416   05/31/23 1445  fidaxomicin  (DIFICID ) tablet 200 mg        200 mg Oral 2 times daily 05/31/23 1353 06/10/23 0959   05/31/23 1345  vancomycin  (VANCOCIN ) capsule 125 mg  Status:  Discontinued        125 mg Oral Daily 05/31/23 1253 05/31/23 1353   05/31/23 1000  fidaxomicin  (DIFICID ) tablet 200 mg  Status:  Discontinued        200 mg Oral 2 times daily 05/31/23 0812 05/31/23 0813   05/31/23 1000  metroNIDAZOLE  (FLAGYL ) IVPB 500 mg  Status:  Discontinued        500 mg 100 mL/hr over 60 Minutes Intravenous Every 12 hours 05/31/23 0813 06/06/23 0951   05/29/23 2300  cefTRIAXone  (ROCEPHIN ) 2 g in sodium chloride  0.9 % 100 mL IVPB        2 g 200 mL/hr over 30 Minutes Intravenous Every 24 hours 05/29/23 2211     05/29/23 1700  ceFEPIme  (MAXIPIME ) 2 g in  sodium chloride  0.9 % 100 mL IVPB  Status:  Discontinued        2 g 200 mL/hr over 30 Minutes Intravenous Every 12 hours 05/29/23 1437 05/29/23 2211   05/29/23 1600  metroNIDAZOLE  (FLAGYL ) IVPB 500 mg  Status:  Discontinued        500 mg 100 mL/hr over 60 Minutes Intravenous Every 12 hours 05/29/23 1511 05/31/23 0812   05/29/23 1500  vancomycin  (VANCOREADY) IVPB 2000 mg/400 mL        2,000 mg 200 mL/hr over 120 Minutes Intravenous  Once 05/29/23 1437 05/29/23 1724   05/29/23 0430  ceFEPIme  (MAXIPIME ) 2 g in sodium chloride  0.9 % 100 mL IVPB        2 g 200 mL/hr over 30 Minutes Intravenous  Once 05/29/23 0416 05/29/23 0523        Assessment/Plan Diverticulitis with abscess and recurrent/persistent C. difficile colitis  - Repeat CT 5/6 shows abscess that may now be amenable to drainage. IR consulted, after transfusing platelets she got an IR drain 5/8. Remains poor surgical candidate, do not think she would tolerate an operation, no evidence of emergent surgical interventions necessary for her C Diff. - Abx per ID - FLD, ok to ADAT - We will follow  with you   FEN - As above.  VTE - SCDs, chem ppx on hold due to significant thrombocytopenia ID - Rocephin /Flagyl /Dificid  per ID. Afebrile. WBC 17.1 - did recently get Neupogen     Pancytopenia likely secondary to chemotherapy - cell lines improving E. coli bacteremia Cirrhosis Small cell lung cancer, currently on chemotherapy with metastatic disease Diabetes mellitus GERD History of renal cell cancer, status post left nephrectomy Hypertension Obesity Chronic kidney disease Hyperlipidemia  I reviewed nursing notes, Consultant (ID, IR) notes, hospitalist notes, last 24 h vitals and pain scores, last 48 h intake and output, last 24 h labs and trends, and last 24 h imaging results.   LOS: 10 days    Charlott Converse, [facility] 06/08/2023, 7:51 AM Please see Amion for pager number during day hours 7:00am-4:30pm

## 2023-06-08 NOTE — Progress Notes (Signed)
 Referring Physician(s): Burton,V  Supervising Physician: Babcock,G  Patient Status:  [name] - In-pt  Chief Complaint:  Abdominal-pelvic pain/ diverticular abscess, diarrhea, lung cancer; s/p pelvic drain placement 5/8  Subjective: Pt doing ok; has some lower abd discomfort at drain site; no fever,N/V   Allergies: Culturelle probiotics [lactobacillus] and Bactrim  ds [sulfamethoxazole -trimethoprim ]  Medications: Prior to Admission medications   Medication Sig Start Date End Date Taking? Authorizing Provider  allopurinol  (ZYLOPRIM ) 300 MG tablet TAKE 1 TABLET BY MOUTH EVERY DAY 03/15/23  Yes Neda Balk, MD  aspirin  EC 81 MG tablet Take 81 mg by mouth daily.   Yes [provider]  atorvastatin  (LIPITOR) 20 MG tablet TAKE 1 TABLET BY MOUTH EVERY DAY 05/21/23  Yes Neda Balk, MD  carvedilol  (COREG ) 3.125 MG tablet Take 0.5 tablets (1.5625 mg total) by mouth 2 (two) times daily with a meal. 02/20/23  Yes Byrum, Delora Ferry, MD  dexamethasone  (DECADRON ) 4 MG tablet Take 2 tablets daily for 2 days, on days 4 and 5.Take with food. Every 21 days. 03/14/23  Yes Ivor Mars, MD  diphenhydramine -acetaminophen  (TYLENOL  PM) 25-500 MG TABS Take 1 tablet by mouth at bedtime as needed (sleep).   Yes [provider]  ferrous sulfate  325 (65 FE) MG tablet Take 325 mg by mouth at bedtime.   Yes [provider]  fluconazole  (DIFLUCAN ) 100 MG tablet Take 1 tablet (100 mg total) by mouth daily. 05/08/23  Yes Ennever, Sherryll Donald, MD  glimepiride  (AMARYL ) 2 MG tablet TAKE 1 TABLET BY MOUTH EVERY DAY WITH BREAKFAST 05/21/23  Yes Neda Balk, MD  hyoscyamine  (LEVSIN SL) 0.125 MG SL tablet Place 1 tablet (0.125 mg total) under the tongue every 6 (six) hours as needed. 04/16/23  Yes Neda Balk, MD  ondansetron  (ZOFRAN ) 8 MG tablet Take 1 tablet (8 mg total) by mouth every 8 (eight) hours as needed for nausea or vomiting. Start on third day after chemotherapy. 03/14/23  Yes  Ennever, Sherryll Donald, MD  SEMGLEE , YFGN, 100 UNIT/ML Pen Inject 14 Units into the skin at bedtime. 12/27/22  Yes [provider]  Vitamin D , Ergocalciferol , (DRISDOL ) 1.25 MG (50000 UNIT) CAPS capsule TAKE 1 CAPSULE (50,000 UNITS TOTAL) BY MOUTH EVERY 7 (SEVEN) DAYS. 04/05/23  Yes Webb, Padonda B, FNP  zolpidem  (AMBIEN ) 5 MG tablet Take 1 tablet (5 mg total) by mouth at bedtime. 04/24/23  Yes Olalere, Adewale A, MD  glucose blood (ONETOUCH VERIO) test strip CHECK BLOOD SUGAR DAILY AS NEEDED 05/22/23   Neda Balk, MD  insulin  glargine (LANTUS ) 100 unit/mL SOPN Inject 14 Units into the skin at bedtime. 04/26/23: Reports during TOC 30-day program call: she is currently taking this insulin  14 U at bedtime: reports has not started Semglee  insulin  because she still had Lantus  at home to use- per PCP advice Patient not taking: Reported on 05/29/2023    Neda Balk, MD  Insulin  Pen Needle (BD PEN NEEDLE NANO 2ND GEN) 32G X 4 MM MISC USE AS DIRECTED WITH LANTUS  ONCE A DAY. 11/20/22   Neda Balk, MD  Lancets [facility] DELICA PLUS LANCET33G) MISC USE AS DIRECTED TO TEST DAILY 09/11/22   Neda Balk, MD  vancomycin  (VANCOCIN ) 125 MG capsule Take 125 mg by mouth 4 (four) times daily. Patient not taking: Reported on 05/28/2023 04/18/23   [provider]  glyBURIDE -metformin  (GLUCOVANCE ) 1.25-250 MG tablet Take 1 tablet by mouth 2 (two) times daily with a meal. 03/25/19 03/25/19  Neda Balk, MD     Vital Signs: BP (!) 146/72 (BP Location: Right Arm)   Pulse 78   Temp 98.4 F (36.9 C) (Oral)   Resp 15   Ht 5\' 4"  (1.626 m)   Wt 197 lb 8 oz (89.6 kg)   SpO2 96%   BMI 33.90 kg/m   Physical Exam awake/alert; left lower abd/pelvic drain intact; dressing clean and dry, site mildly tender; OP 285 feculent appearing fluid; drain flushed without difficulty  Imaging: CT GUIDED PERITONEAL/RETROPERITONEAL FLUID DRAIN BY PERC CATH Result Date: 06/07/2023 INDICATION: Pelvic diverticular  abscess EXAM: CT-guided drain placement TECHNIQUE: Multidetector CT imaging of the pelvis was performed following the standard protocol with/without IV contrast. RADIATION DOSE REDUCTION: This exam was performed according to the departmental dose-optimization program which includes automated exposure control, adjustment of the mA and/or kV according to patient size and/or use of iterative reconstruction technique. MEDICATIONS: The patient is currently admitted to the hospital and receiving intravenous antibiotics. The antibiotics were administered within an appropriate time frame prior to the initiation of the procedure. ANESTHESIA/SEDATION: Moderate (conscious) sedation was employed during this procedure. A total of Versed  1 mg and Fentanyl  50 mcg was administered intravenously by the radiology nurse. Total intra-service moderate Sedation Time: 50 minutes. The patient's level of consciousness and vital signs were monitored continuously by radiology nursing throughout the procedure under my direct supervision. COMPLICATIONS: None immediate. PROCEDURE: Informed written consent was obtained from the patient after a thorough discussion of the procedural risks, benefits and alternatives. All questions were addressed. Maximal Sterile Barrier Technique was utilized including caps, mask, sterile gowns, sterile gloves, sterile drape, hand hygiene and skin antiseptic. A timeout was performed prior to the initiation of the procedure. With the patient in a supine position, initial images of the pelvis were obtained. The abscess previously identified on the CT of the abdomen pelvis from Jun 05, 2023 was without significant change. Radiopaque markers were placed on the patient's pelvis and repeat imaging was performed in order to demarcate the access site. Patient's skin was then marked, prepped, and draped in the usual sterile fashion. The skin and deeper tissue was anesthetized with 1% lidocaine . Small incision was made the  patient is scanned and the Yueh needle 15 cm was advanced sequentially into the sigmoid diverticular abscess. Once the needle tip was within the abscess, a guidewire was advanced and coiled within the abscess cavity. The access tract was then dilated with a 10 French fascial dilator. After the tract was dilated, a 10 French pigtail drainage catheter was advanced over the guidewire and coiled within the abscess. The guidewire was removed. Retention suture and sterile dressing were applied. The catheter was connected to a JP bulb and instructions on catheter flushing were placed in the patient's chart. IMPRESSION: Satisfactory CT-guided placement of a 10 French drainage catheter is sigmoid diverticular abscess. There appears to be a large communication between the diverticular abscess and the bowel. Electronically Signed   By: Susan Ensign   On: 06/07/2023 14:55   CT ABDOMEN PELVIS W CONTRAST Result Date: 06/05/2023 CLINICAL DATA:  Metastatic small cell lung cancer with recent perforated diverticulitis and small peridiverticular abscess. Diarrhea. EXAM: CT ABDOMEN AND PELVIS WITH CONTRAST TECHNIQUE: Multidetector CT imaging of the abdomen and pelvis was performed using the standard protocol following bolus administration of intravenous contrast. RADIATION DOSE REDUCTION: This exam was performed according to the departmental dose-optimization program which includes automated exposure control, adjustment of the mA and/or kV according to patient  size and/or use of iterative reconstruction technique. CONTRAST:  OMNIPAQUE  IOHEXOL  300 MG/ML  SOLN COMPARISON:  Abdominopelvic CT 05/30/2023.  PET-CT 05/14/2023. FINDINGS: Lower chest: Similar appearance of residual treated nodule at the right lung base, measuring approximately 1.9 x 1.5 cm on image 24/4. There is atelectasis or scarring at both lung bases. No significant pleural or pericardial effusion. Central venous catheter projects to the superior cavoatrial  junction. There is aortic and coronary artery atherosclerosis. Distal esophageal wall thickening noted. Hepatobiliary: Chronic morphologic changes of cirrhosis. No suspicious focal liver lesion or biliary dilatation identified status post cholecystectomy. Pancreas: Unremarkable. No pancreatic ductal dilatation or surrounding inflammatory changes. Spleen: Normal in size without focal abnormality. Adrenals/Urinary Tract: Both adrenal glands appear normal. Previous left nephrectomy with stable appearance of the nephrectomy bed. The right kidney has a stable appearance with scarring in the lateral interpolar region. No evidence of urinary tract calculus or hydronephrosis. The urinary bladder is decompressed without apparent focal abnormality. Stomach/Bowel: There is enteric contrast within the distal small bowel and colon, extending to the level of the descending colon. The stomach appears unremarkable for its degree of distention. There is no significant small bowel distension, wall thickening or surrounding inflammation. Status post appendectomy. No acute findings are seen in the proximal colon. Diverticulosis and wall thickening of the descending colon again noted with an enlarging collection of gas and fluid inferior to the sigmoid colon, measuring 4.7 x 3.7 cm on image 90/2. This collection appears more organized than on the previous study with mild peripheral enhancement, suspicious for developing abscess. No contrast extravasation. A rectal tube is in place. Vascular/Lymphatic: There are no enlarged abdominal or pelvic lymph nodes. Aortic and branch vessel atherosclerosis without evidence of aneurysm or large vessel occlusion. The portal, superior mesenteric and splenic veins are patent. There are small gastric varices. Reproductive: Status post hysterectomy. No evidence of adnexal mass. Other: Interval development of a moderate amount of ascites throughout the peritoneal cavity. This fluid appears simple,  measuring water  density. There are no dependent high density components, extravasated enteric contrast or other focal fluid collection. No pneumoperitoneum. Interval increased mesenteric and subcutaneous edema. No focal abdominal wall hernia. Musculoskeletal: Stable known sclerotic metastasis involving the left iliac bone. Possible small metastasis within the right ischium. No progressive osseous metastatic disease or acute osseous findings identified. Old fracture of the right 6th rib laterally. There is multilevel spondylosis with prominent posterior osteophytes at L1-2 and L3-4. IMPRESSION: 1. Enlarging collection of gas and fluid inferior to the sigmoid colon, suspicious for developing abscess. No evidence of contrast extravasation or pneumoperitoneum. 2. Interval development of a moderate amount of ascites throughout the peritoneal cavity. Although nonspecific, this may relate to the patient's cirrhosis and/or reactive ascites from the diverticulitis. No other focal fluid collections are identified. 3. Chronic morphologic changes of cirrhosis with small gastric varices. 4. Stable appearance of residual treated nodule at the right lung base. 5. Stable osseous metastatic disease. No progressive osseous metastatic disease identified. 6. Coronary and aortic Atherosclerosis (ICD10-I70.0). Electronically Signed   By: Elmon Hagedorn M.D.   On: 06/05/2023 15:55    Labs:  CBC: Recent Labs    06/05/23 0900 06/06/23 0542 06/06/23 1200 06/07/23 0525 06/08/23 0540  WBC 28.1* 29.2*  --  17.6* 17.1*  HGB 9.3* 8.9*  --  9.3* 9.8*  HCT 28.9* 27.9*  --  28.5* 31.4*  PLT 33* 40* 45* 42* 69*    COAGS: Recent Labs    06/06/23 1000  INR  1.3*    BMP: Recent Labs    06/05/23 0900 06/06/23 0542 06/07/23 0525 06/08/23 0540  NA 138 131* 137 138  K 4.0 3.9 3.8 3.8  CL 113* 107 111 110  CO2 20* 18* 20* 21*  GLUCOSE 195* 143* 108* 136*  BUN 19 22 25* 21  CALCIUM  10.1 9.7 9.6 9.4  CREATININE 0.90 0.84  0.85 0.89  GFRNONAA >60 >60 >60 >60    LIVER FUNCTION TESTS: Recent Labs    06/05/23 0900 06/06/23 0542 06/07/23 0525 06/08/23 0540  BILITOT 0.7 0.4 0.5 0.4  AST 27 23 36 33  ALT 33 28 31 30   ALKPHOS 189* 179* 195* 190*  PROT 5.0* 4.6* 4.7* 4.7*  ALBUMIN 2.3* 2.2* 2.3* 2.4*    Assessment and Plan: Pt with hx lung cancer, diverticulitis with assoc abscess, c diff; s/p left lower abd/pelvic drain placement 5/8; afebrile, WBC 17.1(17.6), hgb 9.8(9.3), plts 69k, creat nl; drain fl cx- pend   Drain Location: LLQ Size: Fr size: 10 Fr Date of placement: 06/07/23  Currently to: Drain collection device: suction bulb 24 hour output:  Output by Drain (mL) 06/06/23 0701 - 06/06/23 1900 06/06/23 1901 - 06/07/23 0700 06/07/23 0701 - 06/07/23 1900 06/07/23 1901 - 06/08/23 0700 06/08/23 0701 - 06/08/23 1523  Closed System Drain 1 Inferior;Medial Abdomen Bulb (JP) 10 Fr.    285       Current examination: Flushes/aspirates easily.  Insertion site unremarkable. Suture and stat lock in place. Dressed appropriately.   Plan: Continue TID flushes with 5 cc NS. Record output Q shift. Dressing changes QD or PRN if soiled.  Call IR APP or on call IR MD if difficulty flushing or sudden change in drain output.  Repeat imaging/possible drain injection once output < 10 mL/QD (excluding flush material). Consideration for drain removal if output is < 10 mL/QD (excluding flush material), pending discussion with the providing surgical service.  Discharge planning: Please contact IR APP or on call IR MD prior to patient d/c to ensure appropriate follow up plans are in place. Typically patient will follow up with IR clinic 10-14 days post d/c for repeat imaging/possible drain injection. IR scheduler will contact patient with date/time of appointment. Patient will need to flush drain QD with 5 cc NS, record output QD, dressing changes every 2-3 days or earlier if soiled.   IR will continue to follow -  please call with questions or concerns.  If high output continues may need to convert to gravity bag due to likely fistula to bowel    Electronically Signed: D. Honore Lux, PA-C 06/08/2023, 3:15 PM   I spent a total of 15 Minutes at the the patient's bedside AND on the patient's hospital floor or unit, greater than 50% of which was counseling/coordinating care for lower abdominal/pelvic abscess drain   Patient ID: Amanda Crawford, female   DOB: 06-20-1945, 78 y.o.   MRN: [id number]

## 2023-06-08 NOTE — Progress Notes (Signed)
 Physical Therapy Treatment Patient Details Name: Amanda Crawford MRN: [id number] DOB: May 24, 1945 Today's Date: 06/08/2023   History of Present Illness Amanda Crawford is a 78 y.o. female admitted with sepsis; 4/30 C. difficile PCR and antigen positive. PMH significant for small cell lung cancer, prior renal cell cancer with lung mets, DM2, CKD, recently hospitalized in March for septic shock diverticulitis and C. difficile infection    PT Comments  Pt in recliner upon arrival, MD finishing up in room, agreeable to therapy. As pt weightshifted forward to power up to stand, therapist noted linen soiled, and as pt fully rises to stand noted flexiseal not in place- notified RN. Pt able to perform STS reps from recliner as NT assisted with pericare. Pt fatigues easily needing seated rest breaks, CGA to power up and maintain static supported standing. Pt needing min A for step pivot back to bed and min A to lift BLE back into bed. JP drain secure at arrival and at EOS; also foley catheter still intact at EOS. Pt noted to fatigue easily with movement and recovers with rest. Pt voices frustrations with frequent BM, flexiseal being replace multiple times and wanting to go home- spouse at bedside offering encouragement as therapist and NT offer encouragement.   If plan is discharge home, recommend the following: A little help with walking and/or transfers;A little help with bathing/dressing/bathroom;Assistance with cooking/housework;Assist for transportation;Help with stairs or ramp for entrance   Can travel by private vehicle        Equipment Recommendations  None recommended by PT    Recommendations for Other Services       Precautions / Restrictions Precautions Precautions: Fall Recall of Precautions/Restrictions: Intact Precaution/Restrictions Comments: JP drain Restrictions Weight Bearing Restrictions Per Provider Order: No     Mobility  Bed Mobility Overal bed mobility: Needs  Assistance Bed Mobility: Sit to Supine       Sit to supine: Min assist   General bed mobility comments: min A to lift BLE Back into bed and reposition to comfort    Transfers Overall transfer level: Needs assistance Equipment used: Rolling walker (2 wheels) Transfers: Sit to/from Stand, Bed to chair/wheelchair/BSC Sit to Stand: Contact guard assist   Step pivot transfers: Min assist       General transfer comment: CGA for STS reps from recliner, min A for steadying with step pivot from recliner to bed, cued for feeling bed on legs prior to attempting to sit down    Ambulation/Gait                   Stairs             Wheelchair Mobility     Tilt Bed    Modified Rankin (Stroke Patients Only)       Balance Overall balance assessment: Needs assistance Sitting-balance support: Feet supported Sitting balance-Leahy Scale: Fair     Standing balance support: Bilateral upper extremity supported, During functional activity, Reliant on assistive device for balance Standing balance-Leahy Scale: Poor                              Communication Communication Communication: No apparent difficulties  Cognition Arousal: Alert Behavior During Therapy: WFL for tasks assessed/performed   PT - Cognitive impairments: No apparent impairments                         Following commands: Intact  Cueing Cueing Techniques: Verbal cues  Exercises      General Comments        Pertinent Vitals/Pain Pain Assessment Pain Assessment: No/denies pain    Home Living                          Prior Function            PT Goals (current goals can now be found in the care plan section) Acute Rehab PT Goals Patient Stated Goal: "no more diarrhea" Progress towards PT goals: Progressing toward goals    Frequency    Min 3X/week      PT Plan      Co-evaluation              AM-PAC PT "6 Clicks" Mobility    Outcome Measure  Help needed turning from your back to your side while in a flat bed without using bedrails?: A Little Help needed moving from lying on your back to sitting on the side of a flat bed without using bedrails?: A Little Help needed moving to and from a bed to a chair (including a wheelchair)?: A Little Help needed standing up from a chair using your arms (e.g., wheelchair or bedside chair)?: A Little Help needed to walk in hospital room?: A Little Help needed climbing 3-5 steps with a railing? : A Lot 6 Click Score: 17    End of Session Equipment Utilized During Treatment: Gait belt Activity Tolerance: Patient tolerated treatment well;Patient limited by fatigue Patient left: in bed;with call bell/phone within reach;with family/visitor present Nurse Communication: Mobility status;Other (comment) (flexiseal out) PT Visit Diagnosis: Muscle weakness (generalized) (M62.81);Difficulty in walking, not elsewhere classified (R26.2)     Time: 1308-6578 PT Time Calculation (min) (ACUTE ONLY): 30 min  Charges:    $Therapeutic Activity: 23-37 mins PT General Charges $$ ACUTE PT VISIT: 1 Visit                     Tori Nickole Adamek PT, DPT 06/08/23, 12:49 PM

## 2023-06-08 NOTE — Progress Notes (Signed)
 PROGRESS NOTE    Amanda Crawford  XBJ:[id number] DOB: [birth year] DOA: 05/29/2023 PCP: Neda Balk, MD   Brief Narrative: CHIQUETA Crawford is a 78 y.o. female with a history of small cell lung cancer on chemotherapy, renal cell cancer with lung metastasis status post wedge resection, CKD stage III, diabetes mellitus, C. difficile, diverticulitis.  Patient presented secondary to weakness, confusion, neutropenia with evidence of sepsis secondary to sigmoid diverticulitis with abscess,  recurrent C. difficile colitis and E. coli bacteremia.  General surgery consulted for diverticulitis with abscess and ID consulted for E. coli bacteremia and C. difficile colitis management. Repeat CT shows worsening abscess with concern for possible free air.   Assessment and Plan:  Sepsis Present on admission.  Secondary to sigmoid diverticulitis with associated abscess in addition to E. coli bacteremia.  Complicated by immunocompromise status.  Sigmoid diverticulitis and abscess General Surgery consulted.  Recommendation for percutaneous drain per IR which was deferred initially.  ID consulted with antibiotic regimen recommendations.  Repeat CT abdomen/pelvis significant for enlarging abscess and continue recommendation for IR drain placement.  Significantly elevated white blood cell count after Neupogen ; unclear this is related to infection; now trended down. CT guided drain placed on 5/8. - General surgery recommendations: IR drain and antibiotics - Continue ceftriaxone  and Flagyl   C. difficile colitis ID consulted.  Patient management fidaxomicin . - ID recommendations: Fidaxomicin , started on 5/1  E. coli bacteremia Likely source is from diverticulitis/abscess.  Patient is on appropriate antibiotics as mentioned above.  Pancytopenia Secondary to chemotherapy treatment.  Associated neutropenia.  Patient given Neupogen  for management of neutropenia in setting of bacterial infection.  Platelets  improving. Patient received 1 unit of platelets on 5/8.  Small cell lung cancer Currently on chemotherapy as an outpatient per oncology.  Diabetes mellitus type 2 Well controlled for age based on hemoglobin A1C of 7.6%. -Continue SSI and Semglee   CKD stage IIIb Baseline and stable.  Primary hypertension Coreg  held secondary to low blood pressure.  Cirrhosis of  the liver Newly diagnosed on CT imaging. Recommendation for outpatient GI follow-up.  Hyperlipidemia Patient's Lipitor held secondary to cirrhosis.  DVT prophylaxis: SCDs Code Status:   Code Status: Full Code Family Communication: Significant other at bedside Disposition Plan: Discharge pending ongoing specialist recommendations/management and transition to outpatient antibiotics   Consultants:  General Surgery Infectious disease Interventional radiology  Procedures:    Antimicrobials:  Vancomycin  Flagyl  Fidaxomicin  Ceftriaxone  Cefepime    Subjective: Patient reports feeling better since the drain was placed. Hesitant about removing her rectal tube and foley catheter.  Objective: BP (!) 146/72 (BP Location: Right Arm)   Pulse 78   Temp 98.4 F (36.9 C) (Oral)   Resp 15   Ht 5\' 4"  (1.626 m)   Wt 89.6 kg   SpO2 96%   BMI 33.90 kg/m   Examination:  General exam: Appears calm and comfortable Respiratory system: Clear to auscultation. Respiratory effort normal. Cardiovascular system: S1 & S2 heard, RRR. No murmurs. Gastrointestinal system: Abdomen is nondistended, soft and nontender. Normal bowel sounds heard. Central nervous system: Alert and oriented. No focal neurological deficits. Musculoskeletal: No calf tenderness Psychiatry: Judgement and insight appear normal. Mood & affect appropriate.    Data Reviewed: I have personally reviewed following labs and imaging studies  CBC Lab Results  Component Value Date   WBC 17.1 (H) 06/08/2023   RBC 2.97 (L) 06/08/2023   HGB 9.8 (L) 06/08/2023    HCT 31.4 (L) 06/08/2023   MCV 105.7 (  H) 06/08/2023   MCH 33.0 06/08/2023   PLT 69 (L) 06/08/2023   MCHC 31.2 06/08/2023   RDW 19.4 (H) 06/08/2023   LYMPHSABS 1.6 06/08/2023   MONOABS 1.0 06/08/2023   EOSABS 0.0 06/08/2023   BASOSABS 0.1 06/08/2023     Last metabolic panel Lab Results  Component Value Date   NA 138 06/08/2023   K 3.8 06/08/2023   CL 110 06/08/2023   CO2 21 (L) 06/08/2023   BUN 21 06/08/2023   CREATININE 0.89 06/08/2023   GLUCOSE 136 (H) 06/08/2023   GFRNONAA >60 06/08/2023   GFRAA 44 (L) 06/05/2019   CALCIUM  9.4 06/08/2023   PHOS 1.6 (L) 06/03/2023   PROT 4.7 (L) 06/08/2023   ALBUMIN 2.4 (L) 06/08/2023   BILITOT 0.4 06/08/2023   ALKPHOS 190 (H) 06/08/2023   AST 33 06/08/2023   ALT 30 06/08/2023   ANIONGAP 7 06/08/2023    GFR: Estimated Creatinine Clearance: 54.7 mL/min (by C-G formula based on SCr of 0.89 mg/dL).  Recent Results (from the past 240 hours)  C Difficile Quick Screen w PCR reflex     Status: Abnormal   Collection Time: 05/30/23 12:45 PM   Specimen: STOOL  Result Value Ref Range Status   C Diff antigen POSITIVE (A) NEGATIVE Final   C Diff toxin NEGATIVE NEGATIVE Final   C Diff interpretation Results are indeterminate. See PCR results.  Final    Comment: Performed at [facility], [address], [place], [place] [address]  C. Diff by PCR, Reflexed     Status: Abnormal   Collection Time: 05/30/23 12:45 PM  Result Value Ref Range Status   Toxigenic C. Difficile by PCR POSITIVE (A) NEGATIVE Final    Comment: Positive for toxigenic C. difficile with little to no toxin production. Only treat if clinical presentation suggests symptomatic illness. Performed at [facility], [address], [place], [place] [address]   Culture, blood (Routine X 2) w Reflex to ID Panel     Status: None   Collection Time: 05/31/23  1:32 PM   Specimen: BLOOD RIGHT ARM  Result Value Ref Range Status   Specimen Description   Final     BLOOD RIGHT ARM Performed at [facility], [address] [place], [place] [address]    Special Requests   Final    BOTTLES DRAWN AEROBIC AND ANAEROBIC Blood Culture results may not be optimal due to an inadequate volume of blood received in culture bottles Performed at [facility], [address], [place], [place] [address]    Culture   Final    NO GROWTH 5 DAYS Performed at [facility], [address], [place], [place] [address]    Report Status 06/05/2023 FINAL  Final  Culture, blood (Routine X 2) w Reflex to ID Panel     Status: None   Collection Time: 05/31/23  1:39 PM   Specimen: BLOOD LEFT ARM  Result Value Ref Range Status   Specimen Description   Final    BLOOD LEFT ARM Performed at [facility], [address] [place], [place] [address]    Special Requests   Final    BOTTLES DRAWN AEROBIC AND ANAEROBIC Blood Culture results may not be optimal due to an inadequate volume of blood received in culture bottles Performed at [facility], [address], [place], [place] [address]    Culture   Final    NO GROWTH  5 DAYS Performed at [facility], [address], [place], [place] [address]    Report Status 06/05/2023 FINAL  Final  Aerobic/Anaerobic Culture w Gram Stain (surgical/deep wound)     Status: None (Preliminary result)   Collection Time: 06/07/23  2:18 PM   Specimen: Abscess  Result Value Ref Range Status   Specimen Description ABSCESS  Final   Special Requests ABDOMEN  Final   Gram Stain   Final    ABUNDANT WBC PRESENT, PREDOMINANTLY PMN FEW GRAM NEGATIVE RODS FEW GRAM POSITIVE RODS    Culture   Final    TOO YOUNG TO READ Performed at [facility], [address], [place], [place] [address]    Report Status PENDING  Incomplete      Radiology Studies: CT GUIDED PERITONEAL/RETROPERITONEAL FLUID DRAIN BY PERC CATH Result Date: 06/07/2023 INDICATION: Pelvic diverticular abscess EXAM:  CT-guided drain placement TECHNIQUE: Multidetector CT imaging of the pelvis was performed following the standard protocol with/without IV contrast. RADIATION DOSE REDUCTION: This exam was performed according to the departmental dose-optimization program which includes automated exposure control, adjustment of the mA and/or kV according to patient size and/or use of iterative reconstruction technique. MEDICATIONS: The patient is currently admitted to the hospital and receiving intravenous antibiotics. The antibiotics were administered within an appropriate time frame prior to the initiation of the procedure. ANESTHESIA/SEDATION: Moderate (conscious) sedation was employed during this procedure. A total of Versed  1 mg and Fentanyl  50 mcg was administered intravenously by the radiology nurse. Total intra-service moderate Sedation Time: 50 minutes. The patient's level of consciousness and vital signs were monitored continuously by radiology nursing throughout the procedure under my direct supervision. COMPLICATIONS: None immediate. PROCEDURE: Informed written consent was obtained from the patient after a thorough discussion of the procedural risks, benefits and alternatives. All questions were addressed. Maximal Sterile Barrier Technique was utilized including caps, mask, sterile gowns, sterile gloves, sterile drape, hand hygiene and skin antiseptic. A timeout was performed prior to the initiation of the procedure. With the patient in a supine position, initial images of the pelvis were obtained. The abscess previously identified on the CT of the abdomen pelvis from Jun 05, 2023 was without significant change. Radiopaque markers were placed on the patient's pelvis and repeat imaging was performed in order to demarcate the access site. Patient's skin was then marked, prepped, and draped in the usual sterile fashion. The skin and deeper tissue was anesthetized with 1% lidocaine . Small incision was made the patient is  scanned and the Yueh needle 15 cm was advanced sequentially into the sigmoid diverticular abscess. Once the needle tip was within the abscess, a guidewire was advanced and coiled within the abscess cavity. The access tract was then dilated with a 10 French fascial dilator. After the tract was dilated, a 10 French pigtail drainage catheter was advanced over the guidewire and coiled within the abscess. The guidewire was removed. Retention suture and sterile dressing were applied. The catheter was connected to a JP bulb and instructions on catheter flushing were placed in the patient's chart. IMPRESSION: Satisfactory CT-guided placement of a 10 French drainage catheter is sigmoid diverticular abscess. There appears to be a large communication between the diverticular abscess and the bowel. Electronically Signed   By: Susan Ensign   On: 06/07/2023 14:55      LOS: 10 days    Aneita Keens, MD [facility] 06/08/2023, 5:23 PM   If 7PM-7AM, please contact night-coverage [email or web address]

## 2023-06-08 NOTE — Plan of Care (Signed)
  Problem: Education: Goal: Knowledge of General Education information will improve Description: Including pain rating scale, medication(s)/side effects and non-pharmacologic comfort measures Outcome: Progressing   Problem: Health Behavior/Discharge Planning: Goal: Ability to manage health-related needs will improve Outcome: Progressing   Problem: Clinical Measurements: Goal: Ability to maintain clinical measurements within normal limits will improve Outcome: Progressing Goal: Will remain free from infection Outcome: Progressing Goal: Diagnostic test results will improve Outcome: Progressing Goal: Respiratory complications will improve Outcome: Progressing Goal: Cardiovascular complication will be avoided Outcome: Progressing   Problem: Activity: Goal: Risk for activity intolerance will decrease Outcome: Progressing   Problem: Coping: Goal: Level of anxiety will decrease Outcome: Progressing   Problem: Pain Managment: Goal: General experience of comfort will improve and/or be controlled Outcome: Progressing   Problem: Education: Goal: Ability to describe self-care measures that may prevent or decrease complications (Diabetes Survival Skills Education) will improve Outcome: Progressing Goal: Individualized Educational Video(s) Outcome: Progressing   Problem: Skin Integrity: Goal: Risk for impaired skin integrity will decrease Outcome: Progressing   Problem: Health Behavior/Discharge Planning: Goal: Ability to identify and utilize available resources and services will improve Outcome: Progressing Goal: Ability to manage health-related needs will improve Outcome: Progressing   Problem: Nutritional: Goal: Maintenance of adequate nutrition will improve Outcome: Progressing Goal: Progress toward achieving an optimal weight will improve Outcome: Progressing   Problem: Skin Integrity: Goal: Risk for impaired skin integrity will decrease Outcome: Progressing    Problem: Tissue Perfusion: Goal: Adequacy of tissue perfusion will improve Outcome: Progressing

## 2023-06-08 NOTE — Progress Notes (Signed)
 Amanda Crawford   DOB:06/18/1945   WU#:[id number]      ASSESSMENT & PLAN:    Small cell lung cancer, extensive stage History of renal cell carcinoma - Cytology of lung nodule confirmed metastatic carcinoma consistent with small cell carcinoma.  - S/p chemotherapy, last received 05/21/2023.   - Medical oncology/Dr. Maria Shiner following  Pericolonic abscess - Left lower quadrant,  -- CT guided drainage catheter done 5/8 by IR.   - Consider repeat CT scan next week - Per Surg team, poor surgical candidate.  Acute diarrhea Colitis with +C. diff - Diarrhea improving. S/p for C. diff in March, presented with recurrence of diarrhea.  Rectal tube in place.  - Positive C. difficile on 4/30, continue meds as ordered - Rectal tube intact, draining liquid stool   Severe thrombocytopenia - Platelets very low 27K on 5/5.  Improving nicely 69k 06/08/23. - Transfuse platelets for counts <20 K or <50 K with bleeding.  Patient reports no bleeding and indeed no bleeding is noted at this time. - Sinew to monitor CBC with differential   Anemia - Hemoglobin improving 9.8 on 5/9 - Recommend PRBC transfusion for hemoglobin <7.5 - Continue to monitor CBC with differential   Leukocytosis - Improving WBC 17.1 on 5/9.  Was neutropenic from chemo. Given G-CSF therapy.   -Continue to monitor CBC with differential   Hypertension Diabetes Hyperlipidemia Cirrhosis - Continue to monitor blood glucose levels. - Continue to monitor blood pressure. - Statin is on hold due to cirrhosis - Medical teams managing    Code Status Full  Subjective:  Patient seen awake alert and oriented x3 sitting in chair at bedside. Patient's husband also at bedside.  Reports she feels okay, happy that she is getting the abscess drained.  Thinks her diarrhea is improving and asking about discharge plans and physical therapy to be done today.   No acute complaints.      Objective:  Vitals:   06/07/23 2206 06/08/23 0537  BP:  132/61 116/65  Pulse:    Resp: 18 16  Temp: 98.1 F (36.7 C) 97.7 F (36.5 C)  SpO2: 94% 93%     Intake/Output Summary (Last 24 hours) at 06/08/2023 1049 Last data filed at 06/08/2023 0500 Gross per 24 hour  Intake 1500 ml  Output 910 ml  Net 590 ml     PHYSICAL EXAMINATION: ECOG PERFORMANCE STATUS: 2 - Symptomatic, <50% confined to bed  Vitals:   06/07/23 2206 06/08/23 0537  BP: 132/61 116/65  Pulse:    Resp: 18 16  Temp: 98.1 F (36.7 C) 97.7 F (36.5 C)  SpO2: 94% 93%   Filed Weights   05/29/23 0304 05/29/23 1416  Weight: 205 lb 14.6 oz (93.4 kg) 197 lb 8 oz (89.6 kg)    GENERAL: alert, no distress and comfortable SKIN: +Pale skin color, texture, turgor are normal, no rashes or significant lesions EYES: normal, conjunctiva are pink and non-injected, sclera clear OROPHARYNX: no exudate, no erythema and lips, buccal mucosa, and tongue normal  NECK: supple, thyroid  normal size, non-tender, without nodularity LYMPH: no palpable lymphadenopathy in the cervical, axillary or inguinal LUNGS: clear to auscultation and percussion with normal breathing effort HEART: regular rate & rhythm and no murmurs and no lower extremity edema ABDOMEN: abdomen soft, non-tender and normal bowel sounds +rectal tube intact +drainage catheter for abscess intact and draining MUSCULOSKELETAL: no cyanosis of digits and no clubbing  PSYCH: alert & oriented x 3 with fluent speech NEURO: no focal motor/sensory deficits  All questions were answered. The patient knows to call the clinic with any problems, questions or concerns.   The total time spent in the appointment was 40 minutes encounter with patient including review of chart and various tests results, discussions about plan of care and coordination of care plan  [name], NP 06/08/2023 10:49 AM    Labs Reviewed:  Lab Results  Component Value Date   WBC 17.1 (H) 06/08/2023   HGB 9.8 (L) 06/08/2023   HCT 31.4 (L) 06/08/2023   MCV  105.7 (H) 06/08/2023   PLT 69 (L) 06/08/2023   Recent Labs    06/06/23 0542 06/07/23 0525 06/08/23 0540  NA 131* 137 138  K 3.9 3.8 3.8  CL 107 111 110  CO2 18* 20* 21*  GLUCOSE 143* 108* 136*  BUN 22 25* 21  CREATININE 0.84 0.85 0.89  CALCIUM  9.7 9.6 9.4  GFRNONAA >60 >60 >60  PROT 4.6* 4.7* 4.7*  ALBUMIN 2.2* 2.3* 2.4*  AST 23 36 33  ALT 28 31 30   ALKPHOS 179* 195* 190*  BILITOT 0.4 0.5 0.4    Studies Reviewed:  CT GUIDED PERITONEAL/RETROPERITONEAL FLUID DRAIN BY PERC CATH Result Date: 06/07/2023 INDICATION: Pelvic diverticular abscess EXAM: CT-guided drain placement TECHNIQUE: Multidetector CT imaging of the pelvis was performed following the standard protocol with/without IV contrast. RADIATION DOSE REDUCTION: This exam was performed according to the departmental dose-optimization program which includes automated exposure control, adjustment of the mA and/or kV according to patient size and/or use of iterative reconstruction technique. MEDICATIONS: The patient is currently admitted to the hospital and receiving intravenous antibiotics. The antibiotics were administered within an appropriate time frame prior to the initiation of the procedure. ANESTHESIA/SEDATION: Moderate (conscious) sedation was employed during this procedure. A total of Versed  1 mg and Fentanyl  50 mcg was administered intravenously by the radiology nurse. Total intra-service moderate Sedation Time: 50 minutes. The patient's level of consciousness and vital signs were monitored continuously by radiology nursing throughout the procedure under my direct supervision. COMPLICATIONS: None immediate. PROCEDURE: Informed written consent was obtained from the patient after a thorough discussion of the procedural risks, benefits and alternatives. All questions were addressed. Maximal Sterile Barrier Technique was utilized including caps, mask, sterile gowns, sterile gloves, sterile drape, hand hygiene and skin antiseptic. A  timeout was performed prior to the initiation of the procedure. With the patient in a supine position, initial images of the pelvis were obtained. The abscess previously identified on the CT of the abdomen pelvis from Jun 05, 2023 was without significant change. Radiopaque markers were placed on the patient's pelvis and repeat imaging was performed in order to demarcate the access site. Patient's skin was then marked, prepped, and draped in the usual sterile fashion. The skin and deeper tissue was anesthetized with 1% lidocaine . Small incision was made the patient is scanned and the Yueh needle 15 cm was advanced sequentially into the sigmoid diverticular abscess. Once the needle tip was within the abscess, a guidewire was advanced and coiled within the abscess cavity. The access tract was then dilated with a 10 French fascial dilator. After the tract was dilated, a 10 French pigtail drainage catheter was advanced over the guidewire and coiled within the abscess. The guidewire was removed. Retention suture and sterile dressing were applied. The catheter was connected to a JP bulb and instructions on catheter flushing were placed in the patient's chart. IMPRESSION: Satisfactory CT-guided placement of a 10 French drainage catheter is sigmoid diverticular abscess. There  appears to be a large communication between the diverticular abscess and the bowel. Electronically Signed   By: Susan Ensign   On: 06/07/2023 14:55   CT ABDOMEN PELVIS W CONTRAST Result Date: 06/05/2023 CLINICAL DATA:  Metastatic small cell lung cancer with recent perforated diverticulitis and small peridiverticular abscess. Diarrhea. EXAM: CT ABDOMEN AND PELVIS WITH CONTRAST TECHNIQUE: Multidetector CT imaging of the abdomen and pelvis was performed using the standard protocol following bolus administration of intravenous contrast. RADIATION DOSE REDUCTION: This exam was performed according to the departmental dose-optimization program which  includes automated exposure control, adjustment of the mA and/or kV according to patient size and/or use of iterative reconstruction technique. CONTRAST:  OMNIPAQUE  IOHEXOL  300 MG/ML  SOLN COMPARISON:  Abdominopelvic CT 05/30/2023.  PET-CT 05/14/2023. FINDINGS: Lower chest: Similar appearance of residual treated nodule at the right lung base, measuring approximately 1.9 x 1.5 cm on image 24/4. There is atelectasis or scarring at both lung bases. No significant pleural or pericardial effusion. Central venous catheter projects to the superior cavoatrial junction. There is aortic and coronary artery atherosclerosis. Distal esophageal wall thickening noted. Hepatobiliary: Chronic morphologic changes of cirrhosis. No suspicious focal liver lesion or biliary dilatation identified status post cholecystectomy. Pancreas: Unremarkable. No pancreatic ductal dilatation or surrounding inflammatory changes. Spleen: Normal in size without focal abnormality. Adrenals/Urinary Tract: Both adrenal glands appear normal. Previous left nephrectomy with stable appearance of the nephrectomy bed. The right kidney has a stable appearance with scarring in the lateral interpolar region. No evidence of urinary tract calculus or hydronephrosis. The urinary bladder is decompressed without apparent focal abnormality. Stomach/Bowel: There is enteric contrast within the distal small bowel and colon, extending to the level of the descending colon. The stomach appears unremarkable for its degree of distention. There is no significant small bowel distension, wall thickening or surrounding inflammation. Status post appendectomy. No acute findings are seen in the proximal colon. Diverticulosis and wall thickening of the descending colon again noted with an enlarging collection of gas and fluid inferior to the sigmoid colon, measuring 4.7 x 3.7 cm on image 90/2. This collection appears more organized than on the previous study with mild peripheral  enhancement, suspicious for developing abscess. No contrast extravasation. A rectal tube is in place. Vascular/Lymphatic: There are no enlarged abdominal or pelvic lymph nodes. Aortic and branch vessel atherosclerosis without evidence of aneurysm or large vessel occlusion. The portal, superior mesenteric and splenic veins are patent. There are small gastric varices. Reproductive: Status post hysterectomy. No evidence of adnexal mass. Other: Interval development of a moderate amount of ascites throughout the peritoneal cavity. This fluid appears simple, measuring water  density. There are no dependent high density components, extravasated enteric contrast or other focal fluid collection. No pneumoperitoneum. Interval increased mesenteric and subcutaneous edema. No focal abdominal wall hernia. Musculoskeletal: Stable known sclerotic metastasis involving the left iliac bone. Possible small metastasis within the right ischium. No progressive osseous metastatic disease or acute osseous findings identified. Old fracture of the right 6th rib laterally. There is multilevel spondylosis with prominent posterior osteophytes at L1-2 and L3-4. IMPRESSION: 1. Enlarging collection of gas and fluid inferior to the sigmoid colon, suspicious for developing abscess. No evidence of contrast extravasation or pneumoperitoneum. 2. Interval development of a moderate amount of ascites throughout the peritoneal cavity. Although nonspecific, this may relate to the patient's cirrhosis and/or reactive ascites from the diverticulitis. No other focal fluid collections are identified. 3. Chronic morphologic changes of cirrhosis with small gastric varices. 4. Stable appearance  of residual treated nodule at the right lung base. 5. Stable osseous metastatic disease. No progressive osseous metastatic disease identified. 6. Coronary and aortic Atherosclerosis (ICD10-I70.0). Electronically Signed   By: Elmon Hagedorn M.D.   On: 06/05/2023 15:55   CT  ABDOMEN PELVIS W CONTRAST Result Date: 05/30/2023 CLINICAL DATA:  Abdominal pain, acute, nonlocalized. EXAM: CT ABDOMEN AND PELVIS WITH CONTRAST TECHNIQUE: Multidetector CT imaging of the abdomen and pelvis was performed using the standard protocol following bolus administration of intravenous contrast. RADIATION DOSE REDUCTION: This exam was performed according to the departmental dose-optimization program which includes automated exposure control, adjustment of the mA and/or kV according to patient size and/or use of iterative reconstruction technique. CONTRAST:  100mL OMNIPAQUE  IOHEXOL  300 MG/ML  SOLN COMPARISON:  CT of the abdomen and pelvis 04/08/2023. FINDINGS: Lower chest: Mild dependent atelectasis is present in the right lower lobe. The lungs are otherwise clear. The heart size is normal. Coronary artery calcifications are present. No significant pleural or pericardial effusion is present. Hepatobiliary: No focal liver abnormality is seen. Status post cholecystectomy. No biliary dilatation. Pancreas: Minimal inflammatory changes about the head and tail the pancreas are less prominent than on the prior exam. No discrete lesions are present. Spleen: Normal in size without focal abnormality. Adrenals/Urinary Tract: The adrenal glands are normal bilaterally. Mild right-sided hydronephrosis are present. Scarring at the lower pole of the right kidney is stable. The right ureter is dilated to the level of the right UVJ. No obstructing stone or mass lesion is evident. Stranding is noted about the distal ureter. Left nephrectomy is again noted. The urinary bladder is mildly distended. Stomach/Bowel: The stomach and duodenum are within normal limits. Small bowel is unremarkable. Terminal ileum is within normal limits. The appendix is absent. The ascending and transverse colon are within normal limits. The descending colon demonstrates some distal diverticular changes. Diffuse diverticular changes are present the  sigmoid colon. Marked wall thickening is present proximally. A fluid collection adjacent to the inflamed sigmoid colon measures 4.4 x 5.6 x 3.5 cm. It abuts the left side of the urinary bladder. No free fluid is present. The distal sigmoid colon and rectum are within normal limits. Vascular/Lymphatic: Atherosclerotic calcifications are present in the aorta and branch vessels. No aneurysm is present. No significant adenopathy is present. Reproductive: Status post hysterectomy. No adnexal masses. Other: No abdominal wall hernia or abnormality. No abdominopelvic ascites. Musculoskeletal: Multilevel degenerative changes are again noted in the lumbar spine. Vacuum disc is present at L1-2, L3-4, L4-5 and L5-S1. Right foraminal narrowing and bilateral scratched at right foraminal stenosis is present at L4-5. Bilateral foraminal narrowing is present at L5-S1. IMPRESSION: 1. Sigmoid diverticulitis. 2. 4.4 x 5.6 x 3.5 cm fluid collection adjacent to the inflamed sigmoid colon compatible with abscess. 3. Mild right-sided hydronephrosis and hydroureter to the level of the right UVJ. No obstructing stone or mass lesion is evident. This may represent a recently passed stone. 4. Left nephrectomy. 5. Multilevel degenerative changes in the lumbar spine. 6. Coronary artery disease. Electronically Signed   By: Audree Leas M.D.   On: 05/30/2023 16:49   CT Angio Chest PE W and/or Wo Contrast Result Date: 05/29/2023 CLINICAL DATA:  Weakness and decreased appetite. Disorientation. Small-cell lung cancer. EXAM: CT ANGIOGRAPHY CHEST WITH CONTRAST TECHNIQUE: Multidetector CT imaging of the chest was performed using the standard protocol during bolus administration of intravenous contrast. Multiplanar CT image reconstructions and MIPs were obtained to evaluate the vascular anatomy. RADIATION DOSE REDUCTION: This exam  was performed according to the departmental dose-optimization program which includes automated exposure control,  adjustment of the mA and/or kV according to patient size and/or use of iterative reconstruction technique. CONTRAST:  60mL OMNIPAQUE  IOHEXOL  350 MG/ML SOLN COMPARISON:  Chest CT 04/08/2023 FINDINGS: Cardiovascular: The heart size is normal. No substantial pericardial effusion. Moderate atherosclerotic calcification is noted in the wall of the thoracic aorta. There is no filling defect within the opacified pulmonary arteries to suggest the presence of an acute pulmonary embolus. Mediastinum/Nodes: No mediastinal lymphadenopathy. There is no hilar lymphadenopathy. The esophagus has normal imaging features. There is no axillary lymphadenopathy. Lungs/Pleura: Retro hilar right lower lobe pulmonary lesion measured previously at 2.7 x 2.3 cm is 2.6 x 1.5 cm on image 63/303. Postsurgical changes noted in the region of the left hilum. No pleural effusion. Upper Abdomen: Nodular liver contour suggests cirrhosis. Musculoskeletal: No worrisome lytic or sclerotic osseous abnormality. Review of the MIP images confirms the above findings. IMPRESSION: 1. No CT evidence for acute pulmonary embolus. 2. Retro hilar right lower lobe pulmonary lesion measured previously at 2.7 x 2.3 cm is 2.6 x 1.5 cm. 3. Nodular liver contour suggests cirrhosis. 4.  Aortic Atherosclerosis (ICD10-I70.0). Electronically Signed   By: Donnal Fusi M.D.   On: 05/29/2023 08:15   DG Chest 1 View Result Date: 05/29/2023 CLINICAL DATA:  Altered mental status EXAM: CHEST  1 VIEW COMPARISON:  04/08/2023 FINDINGS: Lungs are clear. No pneumothorax or pleural effusion. Right internal jugular chest port tip is seen at the superior cavoatrial junction. Cardiac size within limits. Pulmonary vascularity is normal. IMPRESSION: 1. No active disease. Electronically Signed   By: Worthy Heads M.D.   On: 05/29/2023 03:44   CT Head Wo Contrast Result Date: 05/29/2023 EXAM: CT HEAD WITHOUT 05/29/2023 03:29:37 AM TECHNIQUE: CT of the head was performed without the  administration of intravenous contrast. Automated exposure control, iterative reconstruction, and/or weight based adjustment of the mA/kV was utilized to reduce the radiation dose to as low as reasonably achievable. COMPARISON: 04/08/2023 CLINICAL HISTORY: Mental status change, unknown cause. Aphasia. Patient currently on chemotherapy. History of diabetes, lung cancer, migraine, renal cell cancer, and renal insufficiency. FINDINGS: BRAIN AND VENTRICLES: There is no acute intracranial hemorrhage, mass effect or midline shift. No abnormal extra-axial fluid collection. The gray-white differentiation is maintained without evidence of an acute infarct. There is no evidence of hydrocephalus. Mild scattered periventricular and subcortical low density, likely reflecting small vessel ischemic changes. ORBITS: The visualized portion of the orbits demonstrate no acute abnormality. SINUSES: The visualized paranasal sinuses and mastoid air cells demonstrate no acute abnormality. SOFT TISSUES AND SKULL: No acute abnormality of the visualized skull or soft tissues. VASCULATURE: Intracranial atherosclerosis. IMPRESSION: 1. No acute intracranial abnormality. 2. Mild small vessel ischemic changes. Electronically signed by: Zadie Herter MD 05/29/2023 03:32 AM EDT RP Workstation: [id number]   NM PET Image Restag (PS) Skull Base To Thigh Result Date: 05/17/2023 CLINICAL DATA:  Subsequent treatment strategy for small cell lung cancer. EXAM: NUCLEAR MEDICINE PET SKULL BASE TO THIGH TECHNIQUE: 10.2 mCi F-18 FDG was injected intravenously. Full-ring PET imaging was performed from the skull base to thigh after the radiotracer. CT data was obtained and used for attenuation correction and anatomic localization. Fasting blood glucose: 153 mg/dl COMPARISON:  09/81/1914 FINDINGS: Mediastinal blood pool activity: SUV max 3.5 Liver activity: SUV max NA NECK: No significant abnormal hypermetabolic activity in this region. Incidental CT  findings: Bilateral common carotid atheromatous vascular calcification. CHEST: Most of the thoracic  adenopathy has resolved. A remaining subcarinal lymph node measures 0.9 cm in diameter with maximum SUV 5.9. This previously measured 2.0 cm in diameter with maximum SUV 10.1. At the site of prior right infrahilar adenopathy, maximum SUV is currently 5.3 (previously 9.4 with previous 1.7 cm lymph node in this vicinity). Reduced size and activity in the right lower lobe nodule, currently density in this vicinity measures 1.7 by 2.3 cm with maximum SUV 4.9 (formerly 3.2 by 3.2 cm with maximum SUV 7.1). Incidental CT findings: Right Port-A-Cath tip: Right atrium. Coronary, aortic arch, and branch vessel atherosclerotic vascular disease. Mild cardiomegaly. Postoperative findings medially along the left upper lobe. ABDOMEN/PELVIS: Two foci of accentuated activity in the sigmoid colon probably correspond to mild diverticulitis for example on image 167 series 4 where there is signs of some local inflammation and associated activity with maximum SUV up to 8.3. Other areas of accentuated activity in bowel are likely physiologic. Incidental CT findings: Left oophorectomy. Partial right nephrectomy. Cholecystectomy. Small accessory spleen noted. Stable 1.8 by 1.2 cm hypodense lesion lateral to the spleen on image 117 series 4 is not metabolic and highly likely to be benign/incidental. Sigmoid colon diverticulosis and mild diverticulitis. SKELETON: Heterogeneous uptake in the skeleton without a well-defined focal lesion healing lesion of the right fifth rib laterally with associated sclerosis, maximum SUV 3.3 and previously 6.5. Increased sclerosis in the left iliac bone lesion measuring 3.6 cm in long axis on image 148 series 4, maximum SUV 5.0 (formerly 7.3). This lesion no longer stands out against the background marrow activity. Incidental CT findings: Substantial posterior intervertebral spurring at the L1-2 level likely  causing central narrowing of the thecal sac. IMPRESSION: 1. Most of the thoracic adenopathy has resolved, with reduced size and activity of the remaining lymph nodes. 2. Reduced size and activity in the right lower lobe nodule. 3. Increased sclerosis in the left iliac bone lesion, maximum SUV 5.0 (formerly 7.3). This lesion no longer stands out against the background marrow activity. 4. Healing lesion of the right fifth rib laterally with associated sclerosis, maximum SUV 3.3 and previously 6.5. 5. Two foci of accentuated activity in the sigmoid colon probably correspond to mild diverticulitis. 6. Coronary, aortic arch, and branch vessel atherosclerotic vascular disease. Mild cardiomegaly. Aortic Atherosclerosis (ICD10-I70.0). 7. Low-grade diffuse marrow activity, query granulocyte stimulation. 8. Interbody spurring at L1-2 with associated central narrowing of the thecal sac. Electronically Signed   By: Freida Jes M.D.   On: 05/17/2023 12:08

## 2023-06-09 DIAGNOSIS — A0472 Enterocolitis due to Clostridium difficile, not specified as recurrent: Secondary | ICD-10-CM | POA: Diagnosis not present

## 2023-06-09 DIAGNOSIS — A4151 Sepsis due to Escherichia coli [E. coli]: Secondary | ICD-10-CM | POA: Diagnosis not present

## 2023-06-09 DIAGNOSIS — K5720 Diverticulitis of large intestine with perforation and abscess without bleeding: Secondary | ICD-10-CM | POA: Diagnosis not present

## 2023-06-09 LAB — CBC WITH DIFFERENTIAL/PLATELET
Abs Immature Granulocytes: 1.07 10*3/uL — ABNORMAL HIGH (ref 0.00–0.07)
Basophils Absolute: 0.2 10*3/uL — ABNORMAL HIGH (ref 0.0–0.1)
Basophils Relative: 1 %
Eosinophils Absolute: 0 10*3/uL (ref 0.0–0.5)
Eosinophils Relative: 0 %
HCT: 31.2 % — ABNORMAL LOW (ref 36.0–46.0)
Hemoglobin: 9.8 g/dL — ABNORMAL LOW (ref 12.0–15.0)
Immature Granulocytes: 6 %
Lymphocytes Relative: 8 %
Lymphs Abs: 1.5 10*3/uL (ref 0.7–4.0)
MCH: 33.2 pg (ref 26.0–34.0)
MCHC: 31.4 g/dL (ref 30.0–36.0)
MCV: 105.8 fL — ABNORMAL HIGH (ref 80.0–100.0)
Monocytes Absolute: 1.4 10*3/uL — ABNORMAL HIGH (ref 0.1–1.0)
Monocytes Relative: 7 %
Neutro Abs: 14.8 10*3/uL — ABNORMAL HIGH (ref 1.7–7.7)
Neutrophils Relative %: 78 %
Platelets: 99 10*3/uL — ABNORMAL LOW (ref 150–400)
RBC: 2.95 MIL/uL — ABNORMAL LOW (ref 3.87–5.11)
RDW: 19.7 % — ABNORMAL HIGH (ref 11.5–15.5)
WBC: 19 10*3/uL — ABNORMAL HIGH (ref 4.0–10.5)
nRBC: 0.5 % — ABNORMAL HIGH (ref 0.0–0.2)

## 2023-06-09 LAB — GLUCOSE, CAPILLARY
Glucose-Capillary: 128 mg/dL — ABNORMAL HIGH (ref 70–99)
Glucose-Capillary: 167 mg/dL — ABNORMAL HIGH (ref 70–99)
Glucose-Capillary: 134 mg/dL — ABNORMAL HIGH (ref 70–99)
Glucose-Capillary: 126 mg/dL — ABNORMAL HIGH (ref 70–99)

## 2023-06-09 LAB — COMPREHENSIVE METABOLIC PANEL WITH GFR
ALT: 26 U/L (ref 0–44)
AST: 26 U/L (ref 15–41)
Albumin: 2.2 g/dL — ABNORMAL LOW (ref 3.5–5.0)
Alkaline Phosphatase: 181 U/L — ABNORMAL HIGH (ref 38–126)
Anion gap: 3 — ABNORMAL LOW (ref 5–15)
BUN: 20 mg/dL (ref 8–23)
CO2: 24 mmol/L (ref 22–32)
Calcium: 9.5 mg/dL (ref 8.9–10.3)
Chloride: 110 mmol/L (ref 98–111)
Creatinine, Ser: 0.78 mg/dL (ref 0.44–1.00)
GFR, Estimated: >60 mL/min (ref >60)
Glucose, Bld: 145 mg/dL — ABNORMAL HIGH (ref 70–99)
Potassium: 3.8 mmol/L (ref 3.5–5.1)
Sodium: 137 mmol/L (ref 135–145)
Total Bilirubin: 0.7 mg/dL (ref 0.0–1.2)
Total Protein: 4.8 g/dL — ABNORMAL LOW (ref 6.5–8.1)

## 2023-06-09 LAB — MAGNESIUM: Magnesium: 1.8 mg/dL (ref 1.7–2.4)

## 2023-06-09 NOTE — Plan of Care (Signed)

## 2023-06-09 NOTE — Progress Notes (Signed)
 Subjective: 1 episode of nausea/emesis that has resolved. Objective: Vital signs in last 24 hours: Temp:  [97.5 F (36.4 C)-98.4 F (36.9 C)] 97.9 F (36.6 C) (05/10 0424) Pulse Rate:  [78] 78 (05/09 1406) Resp:  [15-20] 20 (05/10 0424) BP: (131-146)/(69-86) 139/86 (05/10 0424) SpO2:  [95 %-96 %] 95 % (05/10 0424) Last BM Date : 06/09/23  Intake/Output from previous day: 05/09 0701 - 05/10 0700 In: -  Out: 1750 [Urine:950; Drains:800] Intake/Output this shift: Total I/O In: -  Out: 175 [Drains:175]  PE: Gen:  Alert, NAD, pleasant Abd: Soft, mild distension, generalized ttp that is greatest in the LLQ without rigidity or guarding, interval placement of JP LLQ draining cloudy SS fluid 800cc over last 24h  Lab Results:  Recent Labs    06/08/23 0540 06/09/23 0525  WBC 17.1* 19.0*  HGB 9.8* 9.8*  HCT 31.4* 31.2*  PLT 69* 99*   BMET Recent Labs    06/08/23 0540 06/09/23 0525  NA 138 137  K 3.8 3.8  CL 110 110  CO2 21* 24  GLUCOSE 136* 145*  BUN 21 20  CREATININE 0.89 0.78  CALCIUM  9.4 9.5   PT/INR No results for input(s): "LABPROT", "INR" in the last 72 hours.  CMP     Component Value Date/Time   NA 137 06/09/2023 0525   NA 139 08/23/2017 0933   NA 140 11/01/2016 0818   K 3.8 06/09/2023 0525   K 4.4 11/01/2016 0818   CL 110 06/09/2023 0525   CL 107 04/16/2012 0923   CO2 24 06/09/2023 0525   CO2 26 11/01/2016 0818   GLUCOSE 145 (H) 06/09/2023 0525   GLUCOSE 141 (H) 11/01/2016 0818   GLUCOSE 115 (H) 04/16/2012 0923   BUN 20 06/09/2023 0525   BUN 24 08/23/2017 0933   BUN 17.9 11/01/2016 0818   CREATININE 0.78 06/09/2023 0525   CREATININE 1.59 (H) 05/21/2023 1003   CREATININE 1.1 11/01/2016 0818   CALCIUM  9.5 06/09/2023 0525   CALCIUM  10.3 11/01/2016 0818   PROT 4.8 (L) 06/09/2023 0525   PROT 7.8 11/01/2016 0818   ALBUMIN 2.2 (L) 06/09/2023 0525   ALBUMIN 3.8 11/01/2016 0818   AST 26 06/09/2023 0525   AST 48 (H) 05/21/2023 1003   AST  75 (H) 11/01/2016 0818   ALT 26 06/09/2023 0525   ALT 34 05/21/2023 1003   ALT 75 (H) 11/01/2016 0818   ALKPHOS 181 (H) 06/09/2023 0525   ALKPHOS 72 11/01/2016 0818   BILITOT 0.7 06/09/2023 0525   BILITOT 0.7 05/21/2023 1003   BILITOT 0.78 11/01/2016 0818   GFRNONAA >60 06/09/2023 0525   GFRNONAA 33 (L) 05/21/2023 1003   GFRAA 44 (L) 06/05/2019 0933   Lipase     Component Value Date/Time   LIPASE 67 (H) 05/29/2023 0303    Studies/Results: CT GUIDED PERITONEAL/RETROPERITONEAL FLUID DRAIN BY PERC CATH Result Date: 06/07/2023 INDICATION: Pelvic diverticular abscess EXAM: CT-guided drain placement TECHNIQUE: Multidetector CT imaging of the pelvis was performed following the standard protocol with/without IV contrast. RADIATION DOSE REDUCTION: This exam was performed according to the departmental dose-optimization program which includes automated exposure control, adjustment of the mA and/or kV according to patient size and/or use of iterative reconstruction technique. MEDICATIONS: The patient is currently admitted to the hospital and receiving intravenous antibiotics. The antibiotics were administered within an appropriate time frame prior to the initiation of the procedure. ANESTHESIA/SEDATION: Moderate (conscious) sedation was employed during this procedure. A total of Versed   1 mg and Fentanyl  50 mcg was administered intravenously by the radiology nurse. Total intra-service moderate Sedation Time: 50 minutes. The patient's level of consciousness and vital signs were monitored continuously by radiology nursing throughout the procedure under my direct supervision. COMPLICATIONS: None immediate. PROCEDURE: Informed written consent was obtained from the patient after a thorough discussion of the procedural risks, benefits and alternatives. All questions were addressed. Maximal Sterile Barrier Technique was utilized including caps, mask, sterile gowns, sterile gloves, sterile drape, hand hygiene and  skin antiseptic. A timeout was performed prior to the initiation of the procedure. With the patient in a supine position, initial images of the pelvis were obtained. The abscess previously identified on the CT of the abdomen pelvis from Jun 05, 2023 was without significant change. Radiopaque markers were placed on the patient's pelvis and repeat imaging was performed in order to demarcate the access site. Patient's skin was then marked, prepped, and draped in the usual sterile fashion. The skin and deeper tissue was anesthetized with 1% lidocaine . Small incision was made the patient is scanned and the Yueh needle 15 cm was advanced sequentially into the sigmoid diverticular abscess. Once the needle tip was within the abscess, a guidewire was advanced and coiled within the abscess cavity. The access tract was then dilated with a 10 French fascial dilator. After the tract was dilated, a 10 French pigtail drainage catheter was advanced over the guidewire and coiled within the abscess. The guidewire was removed. Retention suture and sterile dressing were applied. The catheter was connected to a JP bulb and instructions on catheter flushing were placed in the patient's chart. IMPRESSION: Satisfactory CT-guided placement of a 10 French drainage catheter is sigmoid diverticular abscess. There appears to be a large communication between the diverticular abscess and the bowel. Electronically Signed   By: Susan Ensign   On: 06/07/2023 14:55    Anti-infectives: Anti-infectives (From admission, onward)    Start     Dose/Rate Route Frequency Ordered Stop   06/06/23 2200  metroNIDAZOLE  (FLAGYL ) tablet 500 mg        500 mg Oral Every 12 hours 06/06/23 0951     05/31/23 1600  vancomycin  (VANCOREADY) IVPB 1250 mg/250 mL  Status:  Discontinued        1,250 mg 166.7 mL/hr over 90 Minutes Intravenous Every 48 hours 05/29/23 1437 05/30/23 1416   05/31/23 1445  fidaxomicin  (DIFICID ) tablet 200 mg        200 mg Oral 2  times daily 05/31/23 1353 06/10/23 0959   05/31/23 1345  vancomycin  (VANCOCIN ) capsule 125 mg  Status:  Discontinued        125 mg Oral Daily 05/31/23 1253 05/31/23 1353   05/31/23 1000  fidaxomicin  (DIFICID ) tablet 200 mg  Status:  Discontinued        200 mg Oral 2 times daily 05/31/23 0812 05/31/23 0813   05/31/23 1000  metroNIDAZOLE  (FLAGYL ) IVPB 500 mg  Status:  Discontinued        500 mg 100 mL/hr over 60 Minutes Intravenous Every 12 hours 05/31/23 0813 06/06/23 0951   05/29/23 2300  cefTRIAXone  (ROCEPHIN ) 2 g in sodium chloride  0.9 % 100 mL IVPB        2 g 200 mL/hr over 30 Minutes Intravenous Every 24 hours 05/29/23 2211     05/29/23 1700  ceFEPIme  (MAXIPIME ) 2 g in sodium chloride  0.9 % 100 mL IVPB  Status:  Discontinued        2 g 200 mL/hr  over 30 Minutes Intravenous Every 12 hours 05/29/23 1437 05/29/23 2211   05/29/23 1600  metroNIDAZOLE  (FLAGYL ) IVPB 500 mg  Status:  Discontinued        500 mg 100 mL/hr over 60 Minutes Intravenous Every 12 hours 05/29/23 1511 05/31/23 0812   05/29/23 1500  vancomycin  (VANCOREADY) IVPB 2000 mg/400 mL        2,000 mg 200 mL/hr over 120 Minutes Intravenous  Once 05/29/23 1437 05/29/23 1724   05/29/23 0430  ceFEPIme  (MAXIPIME ) 2 g in sodium chloride  0.9 % 100 mL IVPB        2 g 200 mL/hr over 30 Minutes Intravenous  Once 05/29/23 0416 05/29/23 0523        Assessment/Plan Diverticulitis with abscess and recurrent/persistent C. difficile colitis  - Repeat CT 5/6 shows abscess that may now be amenable to drainage. IR consulted, after transfusing platelets she got an IR drain 5/8. Remains poor surgical candidate, do not think she would tolerate an operation, no evidence of emergent surgical interventions necessary for her C Diff. - Suspect high drain output in part due to ascites in addition to abscess given marked ascites seen on last CT and drain quality - Abx per ID - FLD, ok to ADAT - We will follow with you   FEN - As above.  VTE -  SCDs, chem ppx on hold due to significant thrombocytopenia ID - Rocephin /Flagyl /Dificid  per ID. Afebrile. WBC 19   Pancytopenia likely secondary to chemotherapy - cell lines improving E. coli bacteremia Cirrhosis Small cell lung cancer, currently on chemotherapy with metastatic disease Diabetes mellitus GERD History of renal cell cancer, status post left nephrectomy Hypertension Obesity Chronic kidney disease Hyperlipidemia  I reviewed nursing notes, Consultant (ID, IR) notes, hospitalist notes, last 24 h vitals and pain scores, last 48 h intake and output, last 24 h labs and trends, and last 24 h imaging results.   LOS: 11 days    Edmon Gosling, MD [facility] Surgery 06/09/2023, 12:54 PM Please see Amion for pager number during day hours 7:00am-4:30pm

## 2023-06-09 NOTE — Progress Notes (Signed)
 PROGRESS NOTE    Amanda Crawford  ZOX:[id number] DOB: 1945/02/18 DOA: 05/29/2023 PCP: Neda Balk, MD   Brief Narrative: Amanda Crawford is a 78 y.o. female with a history of small cell lung cancer on chemotherapy, renal cell cancer with lung metastasis status post wedge resection, CKD stage III, diabetes mellitus, C. difficile, diverticulitis.  Patient presented secondary to weakness, confusion, neutropenia with evidence of sepsis secondary to sigmoid diverticulitis with abscess,  recurrent C. difficile colitis and E. coli bacteremia.  General surgery consulted for diverticulitis with abscess and ID consulted for E. coli bacteremia and C. difficile colitis management. Repeat CT shows worsening abscess with concern for possible free air.   Assessment and Plan:  Sepsis Present on admission.  Secondary to sigmoid diverticulitis with associated abscess in addition to E. coli bacteremia.  Complicated by immunocompromise status.  Sigmoid diverticulitis and abscess General Surgery consulted.  Recommendation for percutaneous drain per IR which was deferred initially.  ID consulted with antibiotic regimen recommendations.  Repeat CT abdomen/pelvis significant for enlarging abscess and continue recommendation for IR drain placement.  Significantly elevated white blood cell count after Neupogen ; unclear this is related to infection. CT guided drain placed on 5/8. Wound culture significant for multiple organisms with non predominant. - General surgery recommendations: IR drain and antibiotics - Continue ceftriaxone  and Flagyl   C. difficile colitis ID consulted.  Patient management fidaxomicin . - ID recommendations: Fidaxomicin , started on 5/1  E. coli bacteremia Likely source is from diverticulitis/abscess.  Patient is on appropriate antibiotics as mentioned above.  Pancytopenia Secondary to chemotherapy treatment.  Associated neutropenia.  Patient given Neupogen  for management of neutropenia  in setting of bacterial infection.  Platelets improving. Patient received 1 unit of platelets on 5/8.  Small cell lung cancer Currently on chemotherapy as an outpatient per oncology.  Diabetes mellitus type 2 Well controlled for age based on hemoglobin A1C of 7.6%. -Continue SSI and Semglee   CKD stage IIIb Baseline and stable.  Primary hypertension Coreg  held secondary to low blood pressure.  Cirrhosis of  the liver Newly diagnosed on CT imaging. Recommendation for outpatient GI follow-up.  Acute urinary retention Foley catheter placed on 5/3. -Remove foley and attempt a voiding trial  Hyperlipidemia Patient's Lipitor held secondary to cirrhosis.  DVT prophylaxis: SCDs Code Status:   Code Status: Full Code Family Communication: Significant other at bedside and daughter Disposition Plan: Discharge possibly home pending ongoing specialist recommendations/management and transition to outpatient antibiotics   Consultants:  General Surgery Infectious disease Interventional radiology  Procedures:    Antimicrobials:  Vancomycin  Flagyl  Fidaxomicin  Ceftriaxone  Cefepime    Subjective: Had some mild bowel incontinence. Continued abdominal pain. No other concerns.  Objective: BP (!) 139/99 (BP Location: Right Arm)   Pulse 67   Temp (!) 97.5 F (36.4 C) (Oral)   Resp 18   Ht 5\' 4"  (1.626 m)   Wt 89.6 kg   SpO2 98%   BMI 33.90 kg/m   Examination:  General exam: Appears calm and comfortable Respiratory system: Clear to auscultation. Respiratory effort normal. Cardiovascular system: S1 & S2 heard, RRR. No murmurs, rubs, gallops or clicks. Gastrointestinal system: Abdomen is distended, soft and tender. Normal bowel sounds heard. Central nervous system: Alert and oriented. No focal neurological deficits. Psychiatry: Judgement and insight appear normal. Mood & affect appropriate.     Data Reviewed: I have personally reviewed following labs and imaging  studies  CBC Lab Results  Component Value Date   WBC 19.0 (H) 06/09/2023  RBC 2.95 (L) 06/09/2023   HGB 9.8 (L) 06/09/2023   HCT 31.2 (L) 06/09/2023   MCV 105.8 (H) 06/09/2023   MCH 33.2 06/09/2023   PLT 99 (L) 06/09/2023   MCHC 31.4 06/09/2023   RDW 19.7 (H) 06/09/2023   LYMPHSABS 1.5 06/09/2023   MONOABS 1.4 (H) 06/09/2023   EOSABS 0.0 06/09/2023   BASOSABS 0.2 (H) 06/09/2023     Last metabolic panel Lab Results  Component Value Date   NA 137 06/09/2023   K 3.8 06/09/2023   CL 110 06/09/2023   CO2 24 06/09/2023   BUN 20 06/09/2023   CREATININE 0.78 06/09/2023   GLUCOSE 145 (H) 06/09/2023   GFRNONAA >60 06/09/2023   GFRAA 44 (L) 06/05/2019   CALCIUM  9.5 06/09/2023   PHOS 1.6 (L) 06/03/2023   PROT 4.8 (L) 06/09/2023   ALBUMIN 2.2 (L) 06/09/2023   BILITOT 0.7 06/09/2023   ALKPHOS 181 (H) 06/09/2023   AST 26 06/09/2023   ALT 26 06/09/2023   ANIONGAP 3 (L) 06/09/2023    GFR: Estimated Creatinine Clearance: 60.8 mL/min (by C-G formula based on SCr of 0.78 mg/dL).  Recent Results (from the past 240 hours)  Culture, blood (Routine X 2) w Reflex to ID Panel     Status: None   Collection Time: 05/31/23  1:32 PM   Specimen: BLOOD RIGHT ARM  Result Value Ref Range Status   Specimen Description   Final    BLOOD RIGHT ARM Performed at [facility], [address] [place], [place] [address]    Special Requests   Final    BOTTLES DRAWN AEROBIC AND ANAEROBIC Blood Culture results may not be optimal due to an inadequate volume of blood received in culture bottles Performed at [facility], [address], [place], [place] [address]    Culture   Final    NO GROWTH 5 DAYS Performed at [facility], [address], [place], [place] [address]    Report Status 06/05/2023 FINAL  Final  Culture, blood (Routine X 2) w Reflex to ID Panel     Status: None   Collection Time: 05/31/23  1:39 PM   Specimen: BLOOD LEFT ARM  Result Value Ref Range  Status   Specimen Description   Final    BLOOD LEFT ARM Performed at [facility], [address] [place], [place] [address]    Special Requests   Final    BOTTLES DRAWN AEROBIC AND ANAEROBIC Blood Culture results may not be optimal due to an inadequate volume of blood received in culture bottles Performed at [facility], [address] [place], [place] [address]    Culture   Final    NO GROWTH 5 DAYS Performed at [facility], [address], [place], [place] [address]    Report Status 06/05/2023 FINAL  Final  Aerobic/Anaerobic Culture w Gram Stain (surgical/deep wound)     Status: Abnormal   Collection Time: 06/07/23  2:18 PM   Specimen: Abscess  Result Value Ref Range Status   Specimen Description ABSCESS  Final   Special Requests ABDOMEN  Final   Gram Stain   Final    ABUNDANT WBC PRESENT, PREDOMINANTLY PMN FEW GRAM NEGATIVE RODS FEW GRAM POSITIVE RODS Performed at [facility], [address], [place], [place] [address]    Culture (A)  Final    MULTIPLE ORGANISMS PRESENT, NONE PREDOMINANT MIXED ANAEROBIC FLORA PRESENT.  CALL LAB IF  FURTHER IID REQUIRED.    Report Status 06/09/2023 FINAL  Final      Radiology Studies: No results found.     LOS: 11 days    Aneita Keens, MD [facility] 06/09/2023, 2:52 PM   If 7PM-7AM, please contact night-coverage [email or web address]

## 2023-06-09 NOTE — Progress Notes (Signed)
 NT 3 Mary, removed patients foley catheter under supervision of this RN

## 2023-06-10 ENCOUNTER — Inpatient Hospital Stay (HOSPITAL_COMMUNITY)

## 2023-06-10 DIAGNOSIS — A4151 Sepsis due to Escherichia coli [E. coli]: Secondary | ICD-10-CM | POA: Diagnosis not present

## 2023-06-10 DIAGNOSIS — C651 Malignant neoplasm of right renal pelvis: Secondary | ICD-10-CM | POA: Diagnosis not present

## 2023-06-10 DIAGNOSIS — A0472 Enterocolitis due to Clostridium difficile, not specified as recurrent: Secondary | ICD-10-CM | POA: Diagnosis not present

## 2023-06-10 DIAGNOSIS — Z515 Encounter for palliative care: Secondary | ICD-10-CM

## 2023-06-10 DIAGNOSIS — Z7189 Other specified counseling: Secondary | ICD-10-CM

## 2023-06-10 DIAGNOSIS — D72829 Elevated white blood cell count, unspecified: Secondary | ICD-10-CM

## 2023-06-10 DIAGNOSIS — K630 Abscess of intestine: Secondary | ICD-10-CM

## 2023-06-10 DIAGNOSIS — R590 Localized enlarged lymph nodes: Secondary | ICD-10-CM

## 2023-06-10 DIAGNOSIS — K5720 Diverticulitis of large intestine with perforation and abscess without bleeding: Secondary | ICD-10-CM | POA: Diagnosis not present

## 2023-06-10 LAB — CBC
HCT: 32.3 % — ABNORMAL LOW (ref 36.0–46.0)
Hemoglobin: 10.2 g/dL — ABNORMAL LOW (ref 12.0–15.0)
MCH: 33.2 pg (ref 26.0–34.0)
MCHC: 31.6 g/dL (ref 30.0–36.0)
MCV: 105.2 fL — ABNORMAL HIGH (ref 80.0–100.0)
Platelets: 136 10*3/uL — ABNORMAL LOW (ref 150–400)
RBC: 3.07 MIL/uL — ABNORMAL LOW (ref 3.87–5.11)
RDW: 20.3 % — ABNORMAL HIGH (ref 11.5–15.5)
WBC: 33.3 10*3/uL — ABNORMAL HIGH (ref 4.0–10.5)
nRBC: 0.1 % (ref 0.0–0.2)

## 2023-06-10 LAB — BASIC METABOLIC PANEL WITH GFR
Anion gap: 7 (ref 5–15)
BUN: 29 mg/dL — ABNORMAL HIGH (ref 8–23)
CO2: 19 mmol/L — ABNORMAL LOW (ref 22–32)
Calcium: 9.7 mg/dL (ref 8.9–10.3)
Chloride: 105 mmol/L (ref 98–111)
Creatinine, Ser: 1.41 mg/dL — ABNORMAL HIGH (ref 0.44–1.00)
GFR, Estimated: 38 mL/min — ABNORMAL LOW (ref >60)
Glucose, Bld: 125 mg/dL — ABNORMAL HIGH (ref 70–99)
Potassium: 4.1 mmol/L (ref 3.5–5.1)
Sodium: 131 mmol/L — ABNORMAL LOW (ref 135–145)

## 2023-06-10 LAB — GLUCOSE, CAPILLARY
Glucose-Capillary: 115 mg/dL — ABNORMAL HIGH (ref 70–99)
Glucose-Capillary: 112 mg/dL — ABNORMAL HIGH (ref 70–99)
Glucose-Capillary: 116 mg/dL — ABNORMAL HIGH (ref 70–99)
Glucose-Capillary: 132 mg/dL — ABNORMAL HIGH (ref 70–99)

## 2023-06-10 LAB — MAGNESIUM: Magnesium: 1.8 mg/dL (ref 1.7–2.4)

## 2023-06-10 MED ORDER — IOHEXOL 9 MG/ML PO SOLN
500.0000 mL | ORAL | Status: AC
Start: 2023-06-10 — End: 2023-06-10
  Administered 2023-06-10 (×2): 500 mL via ORAL

## 2023-06-10 MED ORDER — SODIUM CHLORIDE 0.9 % IV SOLN
INTRAVENOUS | Status: AC
Start: 2023-06-10 — End: 2023-06-11

## 2023-06-10 MED ORDER — IOHEXOL 300 MG/ML  SOLN
100.0000 mL | Freq: Once | INTRAMUSCULAR | Status: AC | PRN
  Administered 2023-06-10: 80 mL via INTRAVENOUS

## 2023-06-10 MED ORDER — METRONIDAZOLE 500 MG/100ML IV SOLN
500.0000 mg | Freq: Three times a day (TID) | INTRAVENOUS | Status: AC
  Administered 2023-06-10 – 2023-06-15 (×16): 500 mg via INTRAVENOUS
  Filled 2023-06-10 (×16): qty 100

## 2023-06-10 MED ORDER — SODIUM CHLORIDE (PF) 0.9 % IJ SOLN
INTRAMUSCULAR | Status: AC
  Filled 2023-06-10: qty 50

## 2023-06-10 NOTE — Progress Notes (Signed)
 Patient ID: Amanda Crawford, female   DOB: 01/30/1946, 78 y.o.   MRN: [id number] The CT scan performed today shows worsening of her abscess as well as a small area of free air.  I discussed this situation with both her and her family.  The patient was against having surgery.  I explained to her some of the details of the surgery.  I also explained to her that without surgery there was a very real possibility that she could become septic which could kill her.  She also understands that she is very high risk of a complication from surgery given her underlying age, metastatic lung cancer, and cirrhosis.  I spoke also to her daughter to explain the situation.  The daughter is going to have more conversation with the patient tonight and we will see if the patient changes her mind.  If she decides against surgery then the plan would be continued broad-spectrum antibiotic therapy and have interventional radiology possibly place a second drain in the morning

## 2023-06-10 NOTE — Consult Note (Signed)
 Consultation Note Date: 06/10/2023   Patient Name: Amanda Crawford  DOB: 1945/07/14  MRN: [id number]  Age / Sex: 78 y.o., female   PCP: Neda Balk, MD Referring Physician: Verlyn Goad, MD  Reason for Consultation: Establishing goals of care     Chief Complaint/History of Present Illness:   Patient is an 78 year old female with a past medical history of small cell lung cancer on chemotherapy, renal cell cancer with lung metastases status post wedge resection, CKD stage III, diabetes mellitus, diverticulitis, and C. difficile who was admitted on 05/29/2023 secondary to weakness and confusion.  During hospitalization patient was found to have NSTEMI and being neutropenic.  Patient found to have evidence of sepsis secondary to sigmoid diverticulitis with abscess and recurrent difficile colitis with E. coli bacteremia.  General surgery, ID, and IR consulted during hospitalization for recommendations.  Palliative medicine team consulted to assist with complex medical decision making.  Extensive review of EMR prior to presenting to bedside.  Reviewed recent surgery and infectious disease notes.  Patient having complications associated with sigmoid diverticulitis and abscess.  Patient underwent CT-guided drain by IR on 06/07/2023 for management of abscess.  Initially leukocytosis improving though now significantly worsened.  Surgery team planning to repeat CT abdomen/pelvis to reassess today.  Surgery team has already discussed with patient that patient would be a poor surgical candidate in setting of her multiple medical conditions and poor nutrition.  Patient had also voiced to surgical team that she did not want to undergo surgical interventions. Discussed care with hospitalist for medical updates.  Presented to bedside to see patient.  No visitors present at bedside.  Able to introduce myself to patient in my role in patient's medical journey.  Spent time learning about patient's hospitalization.   Patient noted determining of abscess during hospitalization which has led to prolong her time here.  Spent time reviewing current medical course.  Able to discussed with patient that lab work is showing worsening of infection despite draining of abscess.  Noted surgery team planning for repeat imaging today to assist with further determination about interventions available.  Spent time learning about patient's life prior to admission.  Patient noted that she was essentially bedbound prior to hospitalization.  Patient notes she lives with her husband.  Patient enjoyed time visiting with him and family and friends.  With permission, able to explain current concerns related to her medical condition.  Noted CT abdomen would help and determination of next steps.  Inquired about patient's hopes for medical care moving forward.  Patient noted that she would hope this could be resolved with antibiotics though it does not appear things are headed in that direction.  Acknowledged this.  Inquired about discussions with surgery team and patient again confirms that she does not want to undergo surgery though she did not also know how bad things were at this time.  Patient notes she is terrified of undergoing surgery.  Acknowledged this and spent time providing emotional support via active listening.  Able to discuss that based on imaging outcomes and care team's recommendations, would need further discussions about pathways for medical care moving forward such as continuing with aggressive medical interventions versus focusing on patient's comfort.  Again with permission, able to discuss patient's CODE STATUS.  Spent time explaining full code versus DNR/DNI.  Patient noted that she completed a living well many years ago though she does not remember what it stated regarding her wishes for medical care.  Expressed concern that  with patient's multiple medical conditions and decreased functional status, if patient were sick  enough her heart were to stop or she would stop breathing, interventions such as cardiac resuscitation and intubation with mechanical ventilation for life support would not lead to good quality of life outcomes.  Patient not wanting to make a decision at this time about changing her CODE STATUS though noted she would consider it further.  During conversation RN presented to bedside and noted patient was supposed to be taken down for CT at this time.  Noted palliative medicine team will continue to follow along with patient's medical journey and awaits the results of the scans.  Will plan to continue conversations with patient tomorrow.  Patient agreeing with this plan.  Primary Diagnoses  Present on Admission:  Sepsis [facility])   Palliative Review of Systems: Abdominal discomfort  Past Medical History:  Diagnosis Date   Allergic state 03/12/2015   Arthritis    Cirrhosis (HCC)    Colon polyp 11/19/2014   Dermatitis 03/12/2015   Diarrhea 06/29/2016   Diverticulitis    DM (diabetes mellitus), type 2 (HCC) 06/29/2016   GERD (gastroesophageal reflux disease)    occ   Gout 03/12/2015   H/O measles    H/O mumps    Headache(784.0)    migraines   History of chicken pox 11/23/2014   Hyperglycemia 06/29/2016   Insomnia 02/13/2023   Lung cancer (HCC)    Migraine 11/23/2014   Obesity 11/23/2014   Pneumonia    child   Preventative health care 09/12/2015   Primary hypertension 12/15/2020   Renal cell cancer (HCC)    renal cell ca dx 9/01 and 8/08;   Renal insufficiency    Shortness of breath    occ   Skin lesion of breast 03/12/2015   Sleep apnea 01/24/2023   recently dx 01/24/23 - CPAP ordered by not received yet   Small cell carcinoma of middle lobe of right lung (HCC) 03/14/2023   Small cell lung cancer, right middle lobe (HCC) 03/14/2023   Social History   Socioeconomic History   Marital status: Married    Spouse name: Not on file   Number of children: 2   Years of education:  Not on file   Highest education level: 12th grade  Occupational History   Occupation: retired  Tobacco Use   Smoking status: Former    Current packs/day: 0.00    Average packs/day: 0.5 packs/day for 35.0 years (17.5 ttl pk-yrs)    Types: Cigarettes    Start date: 05/08/1971    Quit date: 05/08/2006    Years since quitting: 17.1   Smokeless tobacco: Never  Vaping Use   Vaping status: Never Used  Substance and Sexual Activity   Alcohol use: Yes    Comment: seldomly   Drug use: No   Sexual activity: Not Currently    Comment: lives with husband, no dietary restrictions.   Other Topics Concern   Not on file  Social History Narrative   Not on file   Social Drivers of Health   Financial Resource Strain: Low Risk  (07/05/2020)   Overall Financial Resource Strain (CARDIA)    Difficulty of Paying Living Expenses: Not hard at all  Food Insecurity: No Food Insecurity (05/29/2023)   Hunger Vital Sign    Worried About Running Out of Food in the Last Year: Never true    Ran Out of Food in the Last Year: Never true  Transportation Needs: No Transportation Needs (05/29/2023)  PRAPARE - Administrator, Civil Service (Medical): No    Lack of Transportation (Non-Medical): No  Physical Activity: Inactive (07/05/2020)   Exercise Vital Sign    Days of Exercise per Week: 0 days    Minutes of Exercise per Session: 0 min  Stress: No Stress Concern Present (08/12/2022)   Received from [facility] Occupational [facility] - Occupational Stress Questionnaire    Feeling of Stress : Not at all  Social Connections: Socially Integrated (05/29/2023)   Social Connection and Isolation Panel [NHANES]    Frequency of Communication with Friends and Family: Three times a week    Frequency of Social Gatherings with Friends and Family: Once a week    Attends Religious Services: More than 4 times per year    Active Member of Clubs or Organizations: Yes    Attends Hospital doctor: More than 4 times per year    Marital Status: Married   Family History  Problem Relation Age of Onset   Dementia Mother    Heart failure Father    Diabetes Father    Hyperlipidemia Father    Heart disease Father    Hypertension Father    Asthma Brother    Asthma Daughter    Alcohol abuse Maternal Aunt    Colon cancer Paternal Aunt    Cancer Paternal Grandmother    Hearing loss Paternal Grandfather    Stroke Paternal Grandfather    Scheduled Meds:  Chlorhexidine  Gluconate Cloth  6 each Topical Daily   dicyclomine   10 mg Oral TID AC & HS   fidaxomicin   200 mg Oral BID   Gerhardt's butt cream   Topical TID   insulin  aspart  0-15 Units Subcutaneous TID WC   insulin  aspart  0-5 Units Subcutaneous QHS   insulin  glargine-yfgn  14 Units Subcutaneous QHS   metroNIDAZOLE   500 mg Oral Q12H   simethicone   80 mg Oral QID   sodium chloride  flush  10-40 mL Intracatheter Q12H   sodium chloride  flush  5 mL Intracatheter Q8H   Continuous Infusions:  cefTRIAXone  (ROCEPHIN )  IV 2 g (06/09/23 2211)   PRN Meds:.acetaminophen  **OR** [DISCONTINUED] acetaminophen , albuterol , dicyclomine , loperamide , morphine  injection, ondansetron  **OR** ondansetron  (ZOFRAN ) IV, mouth rinse, sodium chloride  flush Allergies  Allergen Reactions   Culturelle Probiotics [Lactobacillus]     Patient should not take probiotics while immunocompromised from chemo    Bactrim  Ds [Sulfamethoxazole -Trimethoprim ] Rash   CBC:    Component Value Date/Time   WBC 33.3 (H) 06/10/2023 0440   HGB 10.2 (L) 06/10/2023 0440   HGB 8.8 (L) 05/21/2023 1003   HGB 13.9 11/01/2016 0818   HCT 32.3 (L) 06/10/2023 0440   HCT 41.6 11/01/2016 0818   PLT 136 (L) 06/10/2023 0440   PLT 246 05/21/2023 1003   PLT 254 11/01/2016 0818   PLT 261 07/24/2016 1550   MCV 105.2 (H) 06/10/2023 0440   MCV 94.8 11/01/2016 0818   NEUTROABS 14.8 (H) 06/09/2023 0525   NEUTROABS 5.7 11/01/2016 0818   LYMPHSABS 1.5 06/09/2023 0525   LYMPHSABS  2.7 11/01/2016 0818   MONOABS 1.4 (H) 06/09/2023 0525   MONOABS 0.6 11/01/2016 0818   EOSABS 0.0 06/09/2023 0525   EOSABS 0.3 11/01/2016 0818   BASOSABS 0.2 (H) 06/09/2023 0525   BASOSABS 0.1 11/01/2016 0818   Comprehensive Metabolic Panel:    Component Value Date/Time   NA 137 06/09/2023 0525   NA 139 08/23/2017 0933   NA 140  11/01/2016 0818   K 3.8 06/09/2023 0525   K 4.4 11/01/2016 0818   CL 110 06/09/2023 0525   CL 107 04/16/2012 0923   CO2 24 06/09/2023 0525   CO2 26 11/01/2016 0818   BUN 20 06/09/2023 0525   BUN 24 08/23/2017 0933   BUN 17.9 11/01/2016 0818   CREATININE 0.78 06/09/2023 0525   CREATININE 1.59 (H) 05/21/2023 1003   CREATININE 1.1 11/01/2016 0818   GLUCOSE 145 (H) 06/09/2023 0525   GLUCOSE 141 (H) 11/01/2016 0818   GLUCOSE 115 (H) 04/16/2012 0923   CALCIUM  9.5 06/09/2023 0525   CALCIUM  10.3 11/01/2016 0818   AST 26 06/09/2023 0525   AST 48 (H) 05/21/2023 1003   AST 75 (H) 11/01/2016 0818   ALT 26 06/09/2023 0525   ALT 34 05/21/2023 1003   ALT 75 (H) 11/01/2016 0818   ALKPHOS 181 (H) 06/09/2023 0525   ALKPHOS 72 11/01/2016 0818   BILITOT 0.7 06/09/2023 0525   BILITOT 0.7 05/21/2023 1003   BILITOT 0.78 11/01/2016 0818   PROT 4.8 (L) 06/09/2023 0525   PROT 7.8 11/01/2016 0818   ALBUMIN 2.2 (L) 06/09/2023 0525   ALBUMIN 3.8 11/01/2016 0818    Physical Exam: Vital Signs: BP 122/61 (BP Location: Left Arm)   Pulse 67   Temp 97.8 F (36.6 C) (Oral)   Resp 20   Ht 5\' 4"  (1.626 m)   Wt 89.6 kg   SpO2 96%   BMI 33.90 kg/m  SpO2: SpO2: 96 % O2 Device: O2 Device: Room Air O2 Flow Rate: O2 Flow Rate (L/min): 2 L/min Intake/output summary:  Intake/Output Summary (Last 24 hours) at 06/10/2023 8119 Last data filed at 06/10/2023 0454 Gross per 24 hour  Intake 10 ml  Output 100 ml  Net -90 ml   LBM: Last BM Date : 06/09/23 Baseline Weight: Weight: 93.4 kg Most recent weight: Weight: 89.6 kg  General: NAD, alert, elderly, chronically  ill-appearing Cardiovascular: RRR Respiratory: no increased work of breathing noted, not in respiratory distress Abdomen: distended Neuro: A&Ox4, following commands easily Psych: appropriately answers all questions          Palliative Performance Scale: 30%               Additional Data Reviewed: Recent Labs    06/08/23 0540 06/09/23 0525 06/10/23 0440  WBC 17.1* 19.0* 33.3*  HGB 9.8* 9.8* 10.2*  PLT 69* 99* 136*  NA 138 137  --   BUN 21 20  --   CREATININE 0.89 0.78  --     Imaging: CT GUIDED PERITONEAL/RETROPERITONEAL FLUID DRAIN BY PERC CATH INDICATION: Pelvic diverticular abscess  EXAM: CT-guided drain placement  TECHNIQUE: Multidetector CT imaging of the pelvis was performed following the standard protocol with/without IV contrast.  RADIATION DOSE REDUCTION: This exam was performed according to the departmental dose-optimization program which includes automated exposure control, adjustment of the mA and/or kV according to patient size and/or use of iterative reconstruction technique.  MEDICATIONS: The patient is currently admitted to the hospital and receiving intravenous antibiotics. The antibiotics were administered within an appropriate time frame prior to the initiation of the procedure.  ANESTHESIA/SEDATION: Moderate (conscious) sedation was employed during this procedure. A total of Versed  1 mg and Fentanyl  50 mcg was administered intravenously by the radiology nurse.  Total intra-service moderate Sedation Time: 50 minutes. The patient's level of consciousness and vital signs were monitored continuously by radiology nursing throughout the procedure under my direct supervision.  COMPLICATIONS: None immediate.  PROCEDURE: Informed written consent was obtained from the patient after a thorough discussion of the procedural risks, benefits and alternatives. All questions were addressed. Maximal Sterile Barrier Technique was utilized including  caps, mask, sterile gowns, sterile gloves, sterile drape, hand hygiene and skin antiseptic. A timeout was performed prior to the initiation of the procedure.  With the patient in a supine position, initial images of the pelvis were obtained. The abscess previously identified on the CT of the abdomen pelvis from Jun 05, 2023 was without significant change. Radiopaque markers were placed on the patient's pelvis and repeat imaging was performed in order to demarcate the access site. Patient's skin was then marked, prepped, and draped in the usual sterile fashion. The skin and deeper tissue was anesthetized with 1% lidocaine .  Small incision was made the patient is scanned and the Yueh needle 15 cm was advanced sequentially into the sigmoid diverticular abscess. Once the needle tip was within the abscess, a guidewire was advanced and coiled within the abscess cavity. The access tract was then dilated with a 10 French fascial dilator.  After the tract was dilated, a 10 French pigtail drainage catheter was advanced over the guidewire and coiled within the abscess. The guidewire was removed. Retention suture and sterile dressing were applied. The catheter was connected to a JP bulb and instructions on catheter flushing were placed in the patient's chart.  IMPRESSION: Satisfactory CT-guided placement of a 10 French drainage catheter is sigmoid diverticular abscess. There appears to be a large communication between the diverticular abscess and the bowel.  Electronically Signed   By: Susan Ensign   On: 06/07/2023 14:55    I personally reviewed recent imaging.   Palliative Care Assessment and Plan Summary of Established Goals of Care and Medical Treatment Preferences   Patient is an 77 year old female with a past medical history of small cell lung cancer on chemotherapy, renal cell cancer with lung metastases status post wedge resection, CKD stage III, diabetes mellitus,  diverticulitis, and C. difficile who was admitted on 05/29/2023 secondary to weakness and confusion.  During hospitalization patient was found to have NSTEMI and being neutropenic.  Patient found to have evidence of sepsis secondary to sigmoid diverticulitis with abscess and recurrent difficile colitis with E. coli bacteremia.  General surgery, ID, and IR consulted during hospitalization for recommendations.  Palliative medicine team consulted to assist with complex medical decision making.  # Complex medical decision making/goals of care  - Discussion held with patient as detailed above in HPI.  Spent time reviewing patient's current medical illness and concerns related to worsening leukocytosis.  Noted plan for repeat imaging today which would assist in determining further medical interventions and recommendations.  Introduced idea of possible pathways for medical care moving forward.  Patient was hoping that antibiotics in this drain would fix her problem and she could return to life as it was prior to hospitalization.  While noting hope for this, expressed concern about worsening medical status.  Patient has stated that she fears having surgery; patient is a poor surgical candidate-agree with this.  Noted importance of continued conversations with medical providers moving on.  Noted palliative medicine team continue to follow along to engage in conversations as able and appropriate.  -  Code Status: Full Code    - Introduced discussions regarding CODE STATUS with patient.  Explained full code versus DNR/DNI.  Expressed concern that with patient's multiple underlying medical comorbidities, if patient were sick enough for her to stop or she  would stop breathing, interventions such as intubation and cardiac resuscitation would not lead to good quality of life outcomes.  Recommended patient consider changing CODE STATUS to DNR/DNI.  Patient to consider this further.  Remains full code at this time.  #  Psycho-social/Spiritual Support:  - Support System: Husband, daughter  # Discharge Planning:  To Be Determined  Thank you for allowing the palliative care team to participate in the care Amanda Crawford.  Barnett Libel, DO Palliative Care Provider PMT # [phone]  If patient remains symptomatic despite maximum doses, please call PMT at [phone] between 0700 and 1900. Outside of these hours, please call attending, as PMT does not have night coverage.  Personally spent 85 minutes in patient care including extensive chart review (labs, imaging, progress/consult notes, vital signs), medically appropraite exam, discussed with treatment team, education to patient, family, and staff, documenting clinical information, medication review and management, coordination of care, and available advanced directive documents.

## 2023-06-10 NOTE — Plan of Care (Signed)
  Problem: Education: Goal: Knowledge of General Education information will improve Description: Including pain rating scale, medication(s)/side effects and non-pharmacologic comfort measures Outcome: Progressing   Problem: Health Behavior/Discharge Planning: Goal: Ability to manage health-related needs will improve Outcome: Progressing   Problem: Clinical Measurements: Goal: Ability to maintain clinical measurements within normal limits will improve Outcome: Progressing Goal: Will remain free from infection Outcome: Progressing Goal: Diagnostic test results will improve Outcome: Progressing Goal: Respiratory complications will improve Outcome: Progressing Goal: Cardiovascular complication will be avoided Outcome: Progressing   Problem: Activity: Goal: Risk for activity intolerance will decrease Outcome: Progressing   Problem: Elimination: Goal: Will not experience complications related to bowel motility Outcome: Progressing Goal: Will not experience complications related to urinary retention Outcome: Progressing   Problem: Pain Managment: Goal: General experience of comfort will improve and/or be controlled Outcome: Progressing   Problem: Safety: Goal: Ability to remain free from injury will improve Outcome: Progressing   Problem: Skin Integrity: Goal: Risk for impaired skin integrity will decrease Outcome: Progressing   Problem: Skin Integrity: Goal: Risk for impaired skin integrity will decrease Outcome: Progressing   Problem: Tissue Perfusion: Goal: Adequacy of tissue perfusion will improve Outcome: Progressing

## 2023-06-10 NOTE — Progress Notes (Signed)
 Per Dr. Duard Getting a critical order was placed yesterday for this patient and this order has not been followed. Will inform primary nurse and Dr. Duard Getting stated that he will talk to general surgery regarding this issue.

## 2023-06-10 NOTE — Progress Notes (Signed)
       Interval: No nausea today. Drain looks more feculent today than yesterday but thin Objective: Vital signs in last 24 hours: Temp:  [97.5 F (36.4 C)-98 F (36.7 C)] 97.8 F (36.6 C) (05/11 0636) Pulse Rate:  [67] 67 (05/10 1318) Resp:  [18-20] 20 (05/11 0636) BP: (118-139)/(61-99) 122/61 (05/11 0636) SpO2:  [92 %-98 %] 96 % (05/11 0636) Last BM Date : 06/09/23  Intake/Output from previous day: 05/10 0701 - 05/11 0700 In: 10 [I.V.:10] Out: 275 [Drains:275] Intake/Output this shift: No intake/output data recorded.  PE: Gen:  Alert, NAD, pleasant Abd: Soft, mild distension, generalized ttp that is greatest in the LLQ without rigidity or guarding, LLQ drain has thin brown cloudy drainage, 275 cc over last 24h  Lab Results:  Personally reviewed. Notable for leukocytosis to 33.0  Studies/Results: No results found.  Assessment/Plan Diverticulitis with abscess and recurrent/persistent C. difficile colitis  - Repeat CT 5/6 shows abscess that may now be amenable to drainage. IR consulted, after transfusing platelets she got an IR drain 5/8. Remains poor surgical candidate, do not think she would tolerate an operation, no evidence of emergent surgical interventions necessary for her C Diff. - Suspect drain communicating with colon - Abx per ID - FLD, ok to ADAT - Discussed with primary team regarding jump in WBC and non improving exam. Will order CT AP with PO and IV contrast to assess for any interval worsening in degree of colitis. Also discussed recommendation to involve palliative care with her lack of improvement, poor nutrition, and not only a poor surgical candidate but patient voices she has no interest in surgery.   FEN - As above.  VTE - SCDs, chem ppx on hold due to significant thrombocytopenia ID - Rocephin /Flagyl /Dificid  per ID. Afebrile. WBC 33.0   Pancytopenia likely secondary to chemotherapy - improving Leukocytosis secondary likely to G-CSF and possibly  infection E. coli bacteremia Cirrhosis Small cell lung cancer, currently on chemotherapy with metastatic disease Diabetes mellitus GERD History of renal cell cancer, status post left nephrectomy Hypertension Obesity Chronic kidney disease Hyperlipidemia  I reviewed nursing notes, Consultant (ID, IR) notes, hospitalist notes, last 24 h vitals and pain scores, last 48 h intake and output, last 24 h labs and trends, and last 24 h imaging results.   LOS: 12 days    Edmon Gosling, MD [facility] Surgery 06/10/2023, 7:44 AM Please see Amion for pager number during day hours 7:00am-4:30pm

## 2023-06-10 NOTE — Progress Notes (Signed)
 I decided to check on CT results after hours. Secure chat noted from nursing regarding a critical CT result. Upon checking the CT, the impression notes concern for possible colonic perforation. No page was received via Amion for this critical result. I called the floor, however, I was told l there was no communication with the night staff regarding this result. General surgery on-call, Dr. Alethea Andes, was paged and we discussed the critical result via telephone. General surgery to address.  Aneita Keens,  MD

## 2023-06-10 NOTE — Progress Notes (Signed)
 PROGRESS NOTE    Amanda Crawford  GMW:[id number] DOB: [birth year] DOA: 05/29/2023 PCP: Neda Balk, MD   Brief Narrative: Amanda Crawford is a 78 y.o. female with a history of small cell lung cancer on chemotherapy, renal cell cancer with lung metastasis status post wedge resection, CKD stage III, diabetes mellitus, C. difficile, diverticulitis.  Patient presented secondary to weakness, confusion, neutropenia with evidence of sepsis secondary to sigmoid diverticulitis with abscess,  recurrent C. difficile colitis and E. coli bacteremia.  General surgery consulted for diverticulitis with abscess and ID consulted for E. coli bacteremia and C. difficile colitis management. Repeat CT shows worsening abscess with concern for possible free air.   Assessment and Plan:  Sepsis Present on admission.  Secondary to sigmoid diverticulitis with associated abscess in addition to E. coli bacteremia.  Complicated by immunocompromise status.  Sigmoid diverticulitis and abscess General Surgery consulted.  Recommendation for percutaneous drain per IR which was deferred initially.  ID consulted with antibiotic regimen recommendations.  Repeat CT abdomen/pelvis significant for enlarging abscess and continue recommendation for IR drain placement.  CT guided drain placed on 5/8. Wound culture significant for multiple organisms with non predominant. Leukocytosis initially worsened secondary to Neupogen  with improvement after drain placement; leukocytosis now significantly worsened. - General surgery recommendations: IR drain, antibiotics, CT abdomen/pelvis to re-assess - Continue ceftriaxone  and Flagyl  - ID recommendations for possible antibiotics escalation if warranted  C. difficile colitis ID consulted.  Patient management fidaxomicin . - ID recommendations: Fidaxomicin , started on 5/1  E. coli bacteremia Likely source is from diverticulitis/abscess.  Patient is on appropriate antibiotics as mentioned  above.  Pancytopenia Secondary to chemotherapy treatment.  Associated neutropenia.  Patient given Neupogen  for management of neutropenia in setting of bacterial infection.  Platelets significantly improved. Patient received 1 unit of platelets on 5/8.  Small cell lung cancer Metastatic disease to bone Currently on chemotherapy as an outpatient per oncology.  Diabetes mellitus type 2 Well controlled for age based on hemoglobin A1C of 7.6%. -Continue SSI and Semglee   CKD stage IIIb Baseline and stable.  Primary hypertension Coreg  held secondary to low blood pressure.  Cirrhosis of  the liver Newly diagnosed on CT imaging. Recommendation for outpatient GI follow-up.  Acute urinary retention Foley catheter placed on 5/3. Foley catheter removed on 5/10.  Hyperlipidemia Patient's Lipitor held secondary to cirrhosis.  DVT prophylaxis: SCDs Code Status:   Code Status: Full Code Family Communication: None at bedside Disposition Plan: Discharge possibly home pending ongoing specialist recommendations/management and transition to outpatient antibiotics.   Consultants:  General Surgery Infectious disease Interventional radiology  Procedures:    Antimicrobials:  Vancomycin  Flagyl  Fidaxomicin  Ceftriaxone  Cefepime    Subjective: Patient reports continued abdominal pain. No other concerns today. Patient had a discussion with general surgery regarding her infection and she shared she is not interested in having surgery.  Objective: BP 122/61 (BP Location: Left Arm)   Pulse 67   Temp 97.8 F (36.6 C) (Oral)   Resp 20   Ht 5\' 4"  (1.626 m)   Wt 89.6 kg   SpO2 96%   BMI 33.90 kg/m   Examination:  General exam: Appears calm and comfortable Respiratory system: Clear to auscultation. Respiratory effort normal. Cardiovascular system: S1 & S2 heard, RRR. No murmurs, rubs, gallops or clicks. Gastrointestinal system: Abdomen is nondistended, soft and nontender. Normal bowel  sounds heard. Central nervous system: Alert and oriented. No focal neurological deficits. Musculoskeletal: 2+ BLE pitting edema. No calf tenderness Psychiatry: Judgement and  insight appear normal. Mood & affect appropriate.    Data Reviewed: I have personally reviewed following labs and imaging studies  CBC Lab Results  Component Value Date   WBC 33.3 (H) 06/10/2023   RBC 3.07 (L) 06/10/2023   HGB 10.2 (L) 06/10/2023   HCT 32.3 (L) 06/10/2023   MCV 105.2 (H) 06/10/2023   MCH 33.2 06/10/2023   PLT 136 (L) 06/10/2023   MCHC 31.6 06/10/2023   RDW 20.3 (H) 06/10/2023   LYMPHSABS 1.5 06/09/2023   MONOABS 1.4 (H) 06/09/2023   EOSABS 0.0 06/09/2023   BASOSABS 0.2 (H) 06/09/2023     Last metabolic panel Lab Results  Component Value Date   NA 137 06/09/2023   K 3.8 06/09/2023   CL 110 06/09/2023   CO2 24 06/09/2023   BUN 20 06/09/2023   CREATININE 0.78 06/09/2023   GLUCOSE 145 (H) 06/09/2023   GFRNONAA >60 06/09/2023   GFRAA 44 (L) 06/05/2019   CALCIUM  9.5 06/09/2023   PHOS 1.6 (L) 06/03/2023   PROT 4.8 (L) 06/09/2023   ALBUMIN 2.2 (L) 06/09/2023   BILITOT 0.7 06/09/2023   ALKPHOS 181 (H) 06/09/2023   AST 26 06/09/2023   ALT 26 06/09/2023   ANIONGAP 3 (L) 06/09/2023    GFR: Estimated Creatinine Clearance: 60.8 mL/min (by C-G formula based on SCr of 0.78 mg/dL).  Recent Results (from the past 240 hours)  Culture, blood (Routine X 2) w Reflex to ID Panel     Status: None   Collection Time: 05/31/23  1:32 PM   Specimen: BLOOD RIGHT ARM  Result Value Ref Range Status   Specimen Description   Final    BLOOD RIGHT ARM Performed at [facility], [address], [place], [place] [address]    Special Requests   Final    BOTTLES DRAWN AEROBIC AND ANAEROBIC Blood Culture results may not be optimal due to an inadequate volume of blood received in culture bottles Performed at [facility], [address] [place], [place] [address]    Culture    Final    NO GROWTH 5 DAYS Performed at [facility], [address] [place], [place] [address]    Report Status 06/05/2023 FINAL  Final  Culture, blood (Routine X 2) w Reflex to ID Panel     Status: None   Collection Time: 05/31/23  1:39 PM   Specimen: BLOOD LEFT ARM  Result Value Ref Range Status   Specimen Description   Final    BLOOD LEFT ARM Performed at [facility], [address] [place], [place] [address]    Special Requests   Final    BOTTLES DRAWN AEROBIC AND ANAEROBIC Blood Culture results may not be optimal due to an inadequate volume of blood received in culture bottles Performed at [facility], [address] [place], [place] [address]    Culture   Final    NO GROWTH 5 DAYS Performed at [facility], [address] [place], [place] [address]    Report Status 06/05/2023 FINAL  Final  Aerobic/Anaerobic Culture w Gram Stain (surgical/deep wound)     Status: Abnormal   Collection Time: 06/07/23  2:18 PM   Specimen: Abscess  Result Value Ref Range Status   Specimen Description ABSCESS  Final   Special Requests ABDOMEN  Final   Gram Stain   Final    ABUNDANT WBC PRESENT, PREDOMINANTLY PMN FEW GRAM NEGATIVE RODS FEW GRAM POSITIVE  RODS Performed at [facility], [address], [place], [place] [address]    Culture (A)  Final    MULTIPLE ORGANISMS PRESENT, NONE PREDOMINANT MIXED ANAEROBIC FLORA PRESENT.  CALL LAB IF FURTHER IID REQUIRED.    Report Status 06/09/2023 FINAL  Final      Radiology Studies: No results found.     LOS: 12 days    Aneita Keens, MD [facility] 06/10/2023, 8:00 AM   If 7PM-7AM, please contact night-coverage [email or web address]

## 2023-06-10 NOTE — Progress Notes (Addendum)
 ID brief note ( weekend cross coverage)  Afebrile Stable vitals with no hypotension, shock, ileus.  WBC up from 19 to 33 Cr was normal 5/10  Left lower quadrant drain with thin brown cloudy drainage, to 75 cc over the last 24-hour, no feculent appearing  She is on fidaxomicin  for C diff colitis as well as ceftriaxone  and metronidazole  for sigmoid diverticulitis and abscess ( 5/8 cx with multiple organisms and mixed anaerobic flora) + E coli bacteremia   I reviewed surgical notes from today and plan to repeat CT abdomen/pelvis ( pending at this time).  No change in antibiotics or C diff treatment currently. No signs of fulminant colitis or toxic megacolon so far Will follow CT abdomen pelvis Follow CBC, BMP ordered. D/w primary  Dr Zelda Hickman starting from 5/12.     Addendum 1: 42 pm : called lab to ID multiple organisms seen on 5/8 abdominal cx  Terre Ferri, MD Infectious Disease Physician [facility] [address] [place], [place] [address] Phone: [phone]  Fax: [phone]

## 2023-06-11 ENCOUNTER — Telehealth: Payer: Self-pay

## 2023-06-11 ENCOUNTER — Inpatient Hospital Stay (HOSPITAL_COMMUNITY)

## 2023-06-11 ENCOUNTER — Inpatient Hospital Stay: Admitting: Hematology & Oncology

## 2023-06-11 ENCOUNTER — Inpatient Hospital Stay

## 2023-06-11 DIAGNOSIS — K630 Abscess of intestine: Secondary | ICD-10-CM | POA: Diagnosis not present

## 2023-06-11 DIAGNOSIS — A0472 Enterocolitis due to Clostridium difficile, not specified as recurrent: Secondary | ICD-10-CM | POA: Diagnosis not present

## 2023-06-11 DIAGNOSIS — Z515 Encounter for palliative care: Secondary | ICD-10-CM | POA: Diagnosis not present

## 2023-06-11 DIAGNOSIS — A4151 Sepsis due to Escherichia coli [E. coli]: Secondary | ICD-10-CM | POA: Diagnosis not present

## 2023-06-11 DIAGNOSIS — K5720 Diverticulitis of large intestine with perforation and abscess without bleeding: Secondary | ICD-10-CM | POA: Diagnosis not present

## 2023-06-11 DIAGNOSIS — Z7189 Other specified counseling: Secondary | ICD-10-CM | POA: Diagnosis not present

## 2023-06-11 LAB — CBC WITH DIFFERENTIAL/PLATELET
Abs Immature Granulocytes: 0.93 10*3/uL — ABNORMAL HIGH (ref 0.00–0.07)
Basophils Absolute: 0 10*3/uL (ref 0.0–0.1)
Basophils Relative: 0 %
Eosinophils Absolute: 0 10*3/uL (ref 0.0–0.5)
Eosinophils Relative: 0 %
HCT: 30.4 % — ABNORMAL LOW (ref 36.0–46.0)
Hemoglobin: 9.6 g/dL — ABNORMAL LOW (ref 12.0–15.0)
Immature Granulocytes: 3 %
Lymphocytes Relative: 5 %
Lymphs Abs: 1.5 10*3/uL (ref 0.7–4.0)
MCH: 33.2 pg (ref 26.0–34.0)
MCHC: 31.6 g/dL (ref 30.0–36.0)
MCV: 105.2 fL — ABNORMAL HIGH (ref 80.0–100.0)
Monocytes Absolute: 1.5 10*3/uL — ABNORMAL HIGH (ref 0.1–1.0)
Monocytes Relative: 5 %
Neutro Abs: 25.9 10*3/uL — ABNORMAL HIGH (ref 1.7–7.7)
Neutrophils Relative %: 87 %
Platelets: 137 10*3/uL — ABNORMAL LOW (ref 150–400)
RBC: 2.89 MIL/uL — ABNORMAL LOW (ref 3.87–5.11)
RDW: 20.1 % — ABNORMAL HIGH (ref 11.5–15.5)
WBC: 29.8 10*3/uL — ABNORMAL HIGH (ref 4.0–10.5)
nRBC: 0 % (ref 0.0–0.2)

## 2023-06-11 LAB — COMPREHENSIVE METABOLIC PANEL WITH GFR
ALT: 22 U/L (ref 0–44)
AST: 31 U/L (ref 15–41)
Albumin: 1.9 g/dL — ABNORMAL LOW (ref 3.5–5.0)
Alkaline Phosphatase: 236 U/L — ABNORMAL HIGH (ref 38–126)
Anion gap: 8 (ref 5–15)
BUN: 38 mg/dL — ABNORMAL HIGH (ref 8–23)
CO2: 19 mmol/L — ABNORMAL LOW (ref 22–32)
Calcium: 9.8 mg/dL (ref 8.9–10.3)
Chloride: 108 mmol/L (ref 98–111)
Creatinine, Ser: 1.31 mg/dL — ABNORMAL HIGH (ref 0.44–1.00)
GFR, Estimated: 41 mL/min — ABNORMAL LOW (ref >60)
Glucose, Bld: 108 mg/dL — ABNORMAL HIGH (ref 70–99)
Potassium: 4.5 mmol/L (ref 3.5–5.1)
Sodium: 135 mmol/L (ref 135–145)
Total Bilirubin: 0.5 mg/dL (ref 0.0–1.2)
Total Protein: 4.5 g/dL — ABNORMAL LOW (ref 6.5–8.1)

## 2023-06-11 LAB — GLUCOSE, CAPILLARY
Glucose-Capillary: 102 mg/dL — ABNORMAL HIGH (ref 70–99)
Glucose-Capillary: 81 mg/dL (ref 70–99)
Glucose-Capillary: 110 mg/dL — ABNORMAL HIGH (ref 70–99)
Glucose-Capillary: 116 mg/dL — ABNORMAL HIGH (ref 70–99)

## 2023-06-11 LAB — MAGNESIUM: Magnesium: 2 mg/dL (ref 1.7–2.4)

## 2023-06-11 MED ORDER — VANCOMYCIN HCL 125 MG PO CAPS
125.0000 mg | ORAL_CAPSULE | Freq: Two times a day (BID) | ORAL | Status: DC
  Administered 2023-06-11 – 2023-06-15 (×9): 125 mg via ORAL
  Filled 2023-06-11 (×10): qty 1

## 2023-06-11 NOTE — Progress Notes (Signed)
 Daily Progress Note   Patient Name: Amanda Crawford       Date: 06/11/2023 DOB: [birth year]  Age: 78 y.o. MRN#: [id number] Attending Physician: Verlyn Goad, MD Primary Care Physician: Neda Balk, MD Admit Date: 05/29/2023 Length of Stay: 13 days  Reason for Consultation/Follow-up: Establishing goals of care  Subjective:   CC: Patient struggling with difficult medical situation.  Following up regarding complex medical decision making.  Subjective:  Reviewed EMR prior to presenting to bedside.  Reviewed recent oncology and surgery team notes.  Surgery team obtained repeat imaging and planning for another drain to be placed by IR to assist with nonoperative management.  Patient is an extremely poor operative candidate and so this is not recommended.  Personally reviewed recent CT abdomen/pelvis noting free air in abdomen.  Concerns could be related to recent procedure though cannot exclude colonic perforation.  Patient continues to have persistent descending and proximal sigmoid colonic wall thickening.  Presented to bedside to meet with patient.  Multiple family members at bedside including patient's husband, son, and daughter.  Able to introduce myself and my role as a member of the palliative medicine team.  Able to review patient's discussion with surgery team and results of CT scan.  Again agreed with surgical team that surgical interventions would likely cause worsening of patient's quality of life and could even hasten patient's death.  Discussed currently continuing with medical management including drain placement to determine if patient will have improvement of condition.  Spent time answering questions as able from patient and family.  Also able to review CODE STATUS discussion with patient while family present.  Again explained full code versus DNR/DNI.  Husband notes that he and patient had living will that stated DNR/DNI status.  While patient acknowledges this (and husband and  daughter supporting change of CODE STATUS), patient has a great fear and that she "does not want to die".  Spent time providing emotional support.  Expressed again concern that with patient's multiple medical conditions, if she were sick enough that her heart were to stop or she would stop breathing, interventions such as intubation and cardiac resuscitation would not lead to good quality of life outcomes.  Again reminded patient that even with change of CODE STATUS, could continue to receive appropriate medical interventions for managing current medical conditions.  Patient going to continue discussion with family regarding CODE STATUS.  Daughter going to attempt to obtain patient's living well that discusses these wishes to review with patient as well.  Spent time answering questions as able.  Noted palliative medicine team will continue to follow along with patient's medical journey.  Review of Systems Abdominal discomfort due to diarrhea Objective:   Vital Signs:  BP (!) 118/59 (BP Location: Right Arm)   Pulse 79   Temp 97.6 F (36.4 C) (Oral)   Resp 18   Ht 5\' 4"  (1.626 m)   Wt 89.6 kg   SpO2 93%   BMI 33.90 kg/m   Physical Exam: General: NAD, alert, elderly, chronically ill-appearing Cardiovascular: RRR Respiratory: no increased work of breathing noted, not in respiratory distress Abdomen: distended Neuro: A&Ox4, following commands easily Psych: appropriately answers all questions  Imaging:  I personally reviewed recent imaging.   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 difficile who  was admitted on 05/29/2023 secondary to weakness and confusion. During hospitalization patient was found to have NSTEMI and being neutropenic. Patient found to have evidence of  sepsis secondary to sigmoid diverticulitis with abscess and recurrent difficile colitis with E. coli bacteremia. General surgery, ID, and IR consulted during hospitalization for recommendations. Palliative medicine team consulted to assist with complex medical decision making.   Recommendations/Plan: # Complex medical decision making/goals of care:  - Discussed care with patient while family including husband, son, and daughter present at bedside as detailed above in HPI.  Reviewed patient's current CT imaging.  Planning for IR placement of second drain today.  Reviewed how patient again does not want to proceed with surgery, support this; concerned surgical interventions would worsen patient's quality of life and cause multiple complications including possibility of death.  Continuing with appropriate medical interventions at this time.  Again noted importance of continued discussions based on patient's response to medical interventions.  Noted palliative medicine team continue to follow along and engage in conversations as able and appropriate to assist with supporting next steps.  -  Code Status: Full Code   - Again reviewed CODE STATUS with the patient today while family present.  Husband and daughter support changing patient's CODE STATUS to DNR/DNI; husband notes this is what their living will state.  Patient having difficulty changing her CODE STATUS at this time as she notes she "does not want to die".  Still expressed concerns that if patient were sick and if her heart were to stop or she were to stop breathing, intervention such as intubation and cardiac resuscitation would not lead to good quality of life outcomes with her multiple medical comorbidities.  Remains full code at this time.  Continue to discuss with providers as appropriate.  # Psychosocial Support:  - Husband, son, daughter  # Discharge Planning: To Be Determined  Discussed with: Patient, patient's family at bedside, RN,  hospitalist  Thank you for allowing the palliative care team to participate in the care Amanda Crawford.  Amanda Libel, DO Palliative Care Provider PMT # [phone]  If patient remains symptomatic despite maximum doses, please call PMT at [phone] between 0700 and 1900. Outside of these hours, please call attending, as PMT does not have night coverage.  Personally spent 50 minutes in patient care including extensive chart review (labs, imaging, progress/consult notes, vital signs), medically appropraite exam, discussed with treatment team, education to patient, family, and staff, documenting clinical information, medication review and management, coordination of care, and available advanced directive documents.

## 2023-06-11 NOTE — Progress Notes (Signed)
 PROGRESS NOTE    [name]  VWU:[id number] DOB: July 28, 1945 DOA: 05/29/2023 PCP: Neda Balk, MD   Brief Narrative: Amanda Crawford is a 78 y.o. female with a history of small cell lung cancer on chemotherapy, renal cell cancer with lung metastasis status post wedge resection, CKD stage III, diabetes mellitus, C. difficile, diverticulitis.  Patient presented secondary to weakness, confusion, neutropenia with evidence of sepsis secondary to sigmoid diverticulitis with abscess,  recurrent C. difficile colitis and E. coli bacteremia.  General surgery consulted for diverticulitis with abscess and ID consulted for E. coli bacteremia and C. difficile colitis management. Repeat CT shows worsening abscess with concern for possible free air.   Assessment and Plan:  Sepsis Present on admission.  Secondary to sigmoid diverticulitis with associated abscess in addition to E. coli bacteremia.  Complicated by immunocompromise status.  Sigmoid diverticulitis and abscess General Surgery consulted.  Recommendation for percutaneous drain per IR which was deferred initially.  ID consulted with antibiotic regimen recommendations.  Repeat CT abdomen/pelvis significant for enlarging abscess and continue recommendation for IR drain placement.  CT guided drain placed on 5/8. Wound culture significant for multiple organisms with non predominant. Leukocytosis initially worsened secondary to Neupogen  with improvement after drain placement; leukocytosis now significantly worsened and stabilized. Repeat CT concerning for worsening abscess and free suggesting possible bowel perforation. Surgery declined by patient.  - General surgery recommendations: IR re-consult for second percutaneous drain placement - Continue ceftriaxone  and Flagyl  - ID recommendations pending  C. difficile colitis ID consulted.  Patient management fidaxomicin . - ID recommendations: Fidaxomicin , started on 5/1 and completed. Vancomycin   started today per ID  E. coli bacteremia Likely source is from diverticulitis/abscess.  Patient is on appropriate antibiotics as mentioned above.  Pancytopenia Secondary to chemotherapy treatment.  Associated neutropenia.  Patient given Neupogen  for management of neutropenia in setting of bacterial infection.  Platelets significantly improved. Patient received 1 unit of platelets on 5/8. Pancytopenia resolved.  Small cell lung cancer Metastatic disease to bone Currently on chemotherapy as an outpatient per oncology.  Diabetes mellitus type 2 Well controlled for age based on hemoglobin A1C of 7.6%. -Continue SSI and Semglee   AKI on CKD stage IIIb AKI secondary to poor oral intake. -Continue IV fluids  Primary hypertension Coreg  held secondary to low blood pressure.  Cirrhosis of  the liver Newly diagnosed on CT imaging. Recommendation for outpatient GI follow-up.  Acute urinary retention Foley catheter placed on 5/3. Foley catheter removed on 5/10. Patient is voiding spontaneously. Acute retention appears to be resolved.  Hyperlipidemia Patient's Lipitor held secondary to cirrhosis.  DVT prophylaxis: SCDs Code Status:   Code Status: Full Code Family Communication: Daughter at bedside. Disposition Plan: Discharge possibly home pending ongoing specialist recommendations/management and transition to outpatient antibiotics.   Consultants:  General Surgery Infectious disease Interventional radiology  Procedures:  CT guided percutaneous drain placement  Antimicrobials: Vancomycin  IV & PO Flagyl  Fidaxomicin  Ceftriaxone  Cefepime    Subjective: Patient with continued abdominal pain. Feels like she needs to have a bowel movement. No other issues from overnight. Difficult discussion with surgeon today with her daughter at bedside; decision made to defer surgical management and to attempt drain placement. Patient/family hopeful this will be enough to manage patient's  infection.  Objective: BP (!) 118/59 (BP Location: Right Arm)   Pulse 79   Temp 97.6 F (36.4 C) (Oral)   Resp 18   Ht 5\' 4"  (1.626 m)   Wt 89.6 kg   SpO2 93%  BMI 33.90 kg/m   Examination:  General exam: Appears calm and comfortable Respiratory system: Clear to auscultation. Respiratory effort normal. Cardiovascular system: S1 & S2 heard, RRR. No murmurs, rubs, gallops or clicks. Gastrointestinal system: Abdomen is mildly distended, soft and generally tender. Normal bowel sounds heard. Central nervous system: Alert and oriented. No focal neurological deficits. Psychiatry: Judgement and insight appear normal. Mood & affect appropriate.    Data Reviewed: I have personally reviewed following labs and imaging studies  CBC Lab Results  Component Value Date   WBC 33.3 (H) 06/10/2023   RBC 3.07 (L) 06/10/2023   HGB 10.2 (L) 06/10/2023   HCT 32.3 (L) 06/10/2023   MCV 105.2 (H) 06/10/2023   MCH 33.2 06/10/2023   PLT 136 (L) 06/10/2023   MCHC 31.6 06/10/2023   RDW 20.3 (H) 06/10/2023   LYMPHSABS 1.5 06/09/2023   MONOABS 1.4 (H) 06/09/2023   EOSABS 0.0 06/09/2023   BASOSABS 0.2 (H) 06/09/2023     Last metabolic panel Lab Results  Component Value Date   NA 131 (L) 06/10/2023   K 4.1 06/10/2023   CL 105 06/10/2023   CO2 19 (L) 06/10/2023   BUN 29 (H) 06/10/2023   CREATININE 1.41 (H) 06/10/2023   GLUCOSE 125 (H) 06/10/2023   GFRNONAA 38 (L) 06/10/2023   GFRAA 44 (L) 06/05/2019   CALCIUM  9.7 06/10/2023   PHOS 1.6 (L) 06/03/2023   PROT 4.8 (L) 06/09/2023   ALBUMIN 2.2 (L) 06/09/2023   BILITOT 0.7 06/09/2023   ALKPHOS 181 (H) 06/09/2023   AST 26 06/09/2023   ALT 26 06/09/2023   ANIONGAP 7 06/10/2023    GFR: Estimated Creatinine Clearance: 34.5 mL/min (A) (by C-G formula based on SCr of 1.41 mg/dL (H)).  Recent Results (from the past 240 hours)  Aerobic/Anaerobic Culture w Gram Stain (surgical/deep wound)     Status: None (Preliminary result)   Collection  Time: 06/07/23  2:18 PM   Specimen: Abscess  Result Value Ref Range Status   Specimen Description ABSCESS  Final   Special Requests ABDOMEN  Final   Gram Stain   Final    ABUNDANT WBC PRESENT, PREDOMINANTLY PMN FEW GRAM NEGATIVE RODS FEW GRAM POSITIVE RODS    Culture   Final    CULTURE REINCUBATED FOR BETTER GROWTH HOLDING FOR POSSIBLE ANAEROBE Performed at [facility], [address] [place], [place] [address]    Report Status PENDING  Incomplete      Radiology Studies: CT ABDOMEN PELVIS W CONTRAST Result Date: 06/10/2023 CLINICAL DATA:  Colitis, immunotherapy related toxicity evaluate for interval change in degree of colitis, worsening leukocytosis and lack of improvement in abdominal pain EXAM: CT ABDOMEN AND PELVIS WITH CONTRAST TECHNIQUE: Multidetector CT imaging of the abdomen and pelvis was performed using the standard protocol following bolus administration of intravenous contrast. RADIATION DOSE REDUCTION: This exam was performed according to the departmental dose-optimization program which includes automated exposure control, adjustment of the mA and/or kV according to patient size and/or use of iterative reconstruction technique. CONTRAST:  80mL OMNIPAQUE  IOHEXOL  300 MG/ML  SOLN COMPARISON:  CT 06/05/2023 FINDINGS: Lower chest: Elevated right hemidiaphragm with adjacent compressive atelectasis/scarring in the right middle and lower lobes. Dependent atelectasis in the left lower lobe. The known right lower lobe pulmonary nodule is obscured by atelectasis on the current exam, series 4, image 26. Hepatobiliary: Cirrhotic liver morphology. No focal hepatic lesion. Clips in the gallbladder fossa postcholecystectomy. No biliary dilatation. Pancreas: Fatty atrophy. No disproportionate inflammation. Edema is  likely related to ascites and unchanged. Spleen: No splenomegaly.  No focal abnormality. Adrenals/Urinary Tract: No adrenal nodule. Left nephrectomy with stable appearance of the  nephrectomy bed. Stable chronic scarring in the mid lower right kidney. No right hydronephrosis. No right renal inflammation. Partially distended urinary bladder. There is air in the non dependent bladder. Stomach/Bowel: Administered enteric contrast reaches the descending colon. Persistent distal descending and proximal sigmoid colonic wall thickening in the region of multiple diverticula. There is no progressive bowel inflammation from prior. Interval drainage of pericolonic abscess with a drainage catheter is adjacent to the colon. The heterogeneous collection is not definitively seen, there is currently minimally complex fluid adjacent to the drain on the current exam, measuring 4.4 x 2.7 cm, series 2, image 89. This previously measured 4.7 x 3.7 cm. The lateral aspect of this collection abuts the urinary bladder. There are no new sites of bowel inflammation or wall thickening. No small bowel wall thickening or inflammation. Unremarkable appearance of the stomach. There are perigastric and paraesophageal varices. Vascular/Lymphatic: Aortic atherosclerosis without aneurysm. The portal vein is patent. There are perigastric and paraesophageal varices as well as left upper quadrant varices. Prominent right epicardial nodes are unchanged. No definite enlarged abdominopelvic lymph nodes. Reproductive: Status post hysterectomy. No adnexal masses. Other: Moderate amount of free air is seen in the anterior omentum, for example series 2, image 63. Patchy locules of free air present extending from the subdiaphragmatic region to the pelvis. Moderate volume abdominopelvic ascites, mildly increased from prior exam, predominantly simple fluid. Musculoskeletal: Posterior spurring at L1-L2 causes narrowing of the spinal canal. Stable sclerotic lesion in the left iliac bone. Small sclerotic lesion in the right ischium is again seen. No new osseous abnormalities over the last 5 days. IMPRESSION: 1. Interval drainage of pericolonic  abscess with a drainage catheter adjacent to the colon. The heterogeneous collection is not definitively seen, there is currently a minimally complex fluid adjacent to the drain on the current exam, measuring 4.4 x 2.7 cm. This previously measured 4.7 x 3.7 cm. This may represent a residual collection, less likely loculated ascites. 2. Moderate amount of free air in the anterior abdomen and pelvis. This was not seen on prior exam. This could be related to recent procedure and drain flushing, however difficult to exclude colonic perforation in this setting. Persistent distal descending and proximal sigmoid colonic wall thickening in the region of multiple diverticula, without progressive bowel inflammation. Continued clinical follow-up is needed. 3. Cirrhosis with sequela of portal hypertension including varices. Moderate volume abdominopelvic ascites, mildly increased from prior exam, predominantly simple fluid. 4. Additional stable chronic findings. Aortic Atherosclerosis (ICD10-I70.0). These results were called by telephone at the time of interpretation on 06/10/2023 at 6:05 pm to the patient's nurse, no physician was available at the time of the exam interpretation to receive results. Patient's nurse will get in touch with the doctor and convey these results. Electronically Signed   By: Chadwick Colonel M.D.   On: 06/10/2023 18:11       LOS: 13 days    Aneita Keens, MD [facility] 06/11/2023, 10:48 AM   If 7PM-7AM, please contact night-coverage [email or web address]

## 2023-06-11 NOTE — Progress Notes (Signed)
 Occupational Therapy Treatment Patient Details Name: Amanda Crawford MRN: [id number] DOB: Oct 26, 1945 Today's Date: 06/11/2023   History of present illness Amanda Crawford is a 78 y.o. female admitted with sepsis; 4/30 C. difficile PCR and antigen positive. PMH significant for small cell lung cancer, prior renal cell cancer with lung mets, DM2, CKD, recently hospitalized in March for septic shock diverticulitis and C. difficile infection   OT comments  Patient seen for skilled OT session with husband, son, and dil bedside and open to all therapy presented. Patient able to tolerate SPT to and from [facility] and recliner with RW with increased time and cues for pacing and breathing integration. Chair level BADL's as outlined below. Anticipate with support and assistance from family patient will be able to discharge to home with [facility] however if prolonged hospitalization and bedrest, inpatient rehab may be warranted. OT will continue to follow acutely to progress functional level.       If plan is discharge home, recommend the following:  A lot of help with walking and/or transfers;A lot of help with bathing/dressing/bathroom;Assistance with cooking/housework;Assistance with feeding;Direct supervision/assist for medications management;Direct supervision/assist for financial management;Assist for transportation;Help with stairs or ramp for entrance   Equipment Recommendations  None recommended by OT       Precautions / Restrictions Precautions Precautions: Fall Recall of Precautions/Restrictions: Intact Precaution/Restrictions Comments: JP drain Restrictions Weight Bearing Restrictions Per Provider Order: No       Mobility Bed Mobility Overal bed mobility: Needs Assistance Bed Mobility: Rolling, Supine to Sit, Sit to Supine Rolling: Contact guard assist   Supine to sit: Min assist, HOB elevated, Used rails     General bed mobility comments: increased time and cues    Transfers Overall  transfer level: Needs assistance Equipment used: Rolling walker (2 wheels) Transfers: Sit to/from Stand, Bed to chair/wheelchair/BSC Sit to Stand: Contact guard assist     Step pivot transfers: Min assist     General transfer comment: min cues for hand placement     Balance Overall balance assessment: Needs assistance Sitting-balance support: Feet supported Sitting balance-Leahy Scale: Fair     Standing balance support: Bilateral upper extremity supported, During functional activity, Reliant on assistive device for balance Standing balance-Leahy Scale: Poor Standing balance comment: B UE support on RW with cues                           ADL either performed or assessed with clinical judgement   ADL Overall ADL's : Needs assistance/impaired Eating/Feeding:  (NPO this am for potential proceedure)   Grooming: Set up;Sitting   Upper Body Bathing: Set up;Sitting   Lower Body Bathing: Moderate assistance;Maximal assistance;Sitting/lateral leans;Sit to/from stand   Upper Body Dressing : Contact guard assist;Sitting   Lower Body Dressing: Maximal assistance;Sitting/lateral leans;Bed level;Sit to/from stand;Moderate assistance Lower Body Dressing Details (indicate cue type and reason): rectal tube out Toilet Transfer: Minimal assistance;BSC/3in1;Rolling walker (2 wheels);Stand-pivot   Toileting- Clothing Manipulation and Hygiene: Minimal assistance;Sitting/lateral lean;Sit to/from stand       Functional mobility during ADLs: Minimal assistance;Rolling walker (2 wheels) General ADL Comments: improved tolerance to move to OOB and to [facility] with RW now that [facility] out    Extremity/Trunk Assessment Upper Extremity Assessment Upper Extremity Assessment: Generalized weakness   Lower Extremity Assessment Lower Extremity Assessment: Generalized weakness        Vision   Vision Assessment?: No apparent visual deficits;Wears glasses for reading   Perception  Perception  Perception: Within Functional Limits   Praxis Praxis Praxis: [facility]   Communication Communication Communication: No apparent difficulties   Cognition Arousal: Alert Behavior During Therapy: WFL for tasks assessed/performed Cognition: No apparent impairments                               Following commands: Intact        Cueing   Cueing Techniques: Verbal cues        General Comments NPO for proceedure, tolerated session on RA with RR 18-24 with min cues for pacing and breathing integration    Pertinent Vitals/ Pain       Pain Assessment Pain Assessment: Faces Faces Pain Scale: Hurts a little bit Pain Location: abdomen Pain Descriptors / Indicators: Discomfort Pain Intervention(s): Limited activity within patient's tolerance, Utilized relaxation techniques, Relaxation, Repositioned   Frequency  Min 2X/week        Progress Toward Goals  OT Goals(current goals can now be found in the care plan section)  Progress towards OT goals: Progressing toward goals  Acute Rehab OT Goals Patient Stated Goal: to go home with my husband OT Goal Formulation: With patient/family Time For Goal Achievement: 06/16/23 Potential to Achieve Goals: Good ADL Goals Pt Will Perform Lower Body Bathing: with contact guard assist;sitting/lateral leans;sit to/from stand;with adaptive equipment Pt Will Perform Lower Body Dressing: with min assist;sitting/lateral leans;sit to/from stand;with adaptive equipment Pt Will Transfer to Toilet: bedside commode;ambulating;grab bars;with contact guard assist Pt/caregiver will Perform Home Exercise Program: Increased strength;With written HEP provided;With Supervision Additional ADL Goal #1: Patient will teach back 4/4 ECTs for ADL's and mobility with min cues  Plan         AM-PAC OT "6 Clicks" Daily Activity     Outcome Measure   Help from another person eating meals?: A Little Help from another person taking care of personal  grooming?: A Little Help from another person toileting, which includes using toliet, bedpan, or urinal?: A Little Help from another person bathing (including washing, rinsing, drying)?: A Lot Help from another person to put on and taking off regular upper body clothing?: A Little Help from another person to put on and taking off regular lower body clothing?: A Lot 6 Click Score: 16    End of Session Equipment Utilized During Treatment: Gait belt;Rolling walker (2 wheels)  OT Visit Diagnosis: Unsteadiness on feet (R26.81);Muscle weakness (generalized) (M62.81)   Activity Tolerance Patient limited by fatigue   Patient Left in chair;with chair alarm set;with call bell/phone within reach;with family/visitor present   Nurse Communication Mobility status        Time: 1120-1145 OT Time Calculation (min): 25 min  Charges: OT General Charges $OT Visit: 1 Visit OT Treatments $Self Care/Home Management : 23-37 mins  Keane Martelli OT/L Acute Rehabilitation Department  [phone]  06/11/2023, 1:22 PM

## 2023-06-11 NOTE — Plan of Care (Signed)
  Problem: Education: Goal: Knowledge of General Education information will improve Description: Including pain rating scale, medication(s)/side effects and non-pharmacologic comfort measures Outcome: Progressing   Problem: Clinical Measurements: Goal: Respiratory complications will improve Outcome: Progressing Goal: Cardiovascular complication will be avoided Outcome: Progressing   Problem: Activity: Goal: Risk for activity intolerance will decrease Outcome: Progressing   Problem: Nutrition: Goal: Adequate nutrition will be maintained Outcome: Progressing   Problem: Coping: Goal: Level of anxiety will decrease Outcome: Progressing   Problem: Pain Managment: Goal: General experience of comfort will improve and/or be controlled Outcome: Progressing

## 2023-06-11 NOTE — Telephone Encounter (Signed)
 Copied from CRM [id number]. Topic: General - Other >> Jun 11, 2023 11:29 AM Caliyah H wrote: Reason for CRM: Patient's daughter called to inquire whether the office has received her mother's Living Will form. The patient is currently is a living facility, which is requesting a copy of the Living Will. The daughter is seeking confirmation, if available, requests that the form be faxed to the facility.   Daughter Callback Number:  [phone]   Returned Amanda Crawford call and no answer left vm to return call.

## 2023-06-11 NOTE — Plan of Care (Signed)

## 2023-06-11 NOTE — Progress Notes (Signed)
 Looks like things are really not much better.  She had a CT scan done yesterday which still showed this abscess and possibly worsening.  I know that Surgery is following her closely.  Infectious Disease is following her closely.  She is growing mixed flora in the abscess.  She is on antibiotic coverage with Rocephin  and metronidazole .  She is on fidaxomicin  for the C. difficile.  Her labs show white cell count of 33.3.  Hemoglobin 10.2.  Platelet count 136,000.  Some of the white cell elevation could be from the Neulasta  that she received the day after her chemotherapy.  This plus the Neupogen  that she had been receiving could certainly get the white cell count elevated.  At least, her platelet count is not low.  There is some talk of her having surgery.  I know this would be a very challenging avenue to go down.  I know she has comorbid issues that could certainly make recovery challenging and difficult.  She looks pretty good from my perspective.  She is due for treatment now.  However, given the situation, it is hard to know when she would get any additional therapy.  I noted that her BUN and creatinine are up a little bit.  I am sure this is being watched closely.  I do not think she is eating all that much.  She certainly has not been all that active.  She has had no bleeding.  There is still some diarrhea.  Her vital signs show temperature of 97.6.  Pulse 79.  Blood pressure 118/59.  Her lungs sound clear bilaterally.  Cardiac exam regular rate and rhythm.  There are no murmurs.  Abdomen is soft.  She is tender in the left lower quadrant.  She has decent bowel sounds.  She has maybe a little bit of rebound tenderness.  There is no fluid wave.  There is no palpable hepatomegaly.  Extremities shows maybe some trace edema in the legs.  Skin exam shows some scattered ecchymoses.  Neurological exam is nonfocal.   The real issue now is this abscess.  She has diverticulitis.  I hope that there is  not a perforation.  I know that Surgery is watching this incredibly closely.  I know that she is on good antibiotic coverage.  Hopefully she will not need to have surgical intervention.  We will continue to follow along.  I know the staff have been doing a tremendous job with her.   Rayleen Cal, MD  [place] 1:37

## 2023-06-11 NOTE — Progress Notes (Signed)
 Subjective/Chief Complaint: Patient seen in bed.  She is comfortable .  Daughter at bedside.  Discussed her care.  Patient has no interest in surgery at this point in time.   Objective: Vital signs in last 24 hours: Temp:  [97.6 F (36.4 C)-97.8 F (36.6 C)] 97.6 F (36.4 C) (05/12 1610) Pulse Rate:  [79] 79 (05/11 1454) Resp:  [18] 18 (05/12 0633) BP: (112-131)/(59-68) 118/59 (05/12 0633) SpO2:  [91 %-93 %] 93 % (05/12 0633) Last BM Date : 06/11/23  Intake/Output from previous day: 05/11 0701 - 05/12 0700 In: 815.3 [I.V.:415.3; IV Piggyback:400] Out: 30 [Drains:30] Intake/Output this shift: No intake/output data recorded.  Abdomen: Tender left lower quadrant with drainage.  Some tenderness in right lower quadrant.  Soft though.  Lab Results:  Recent Labs    06/09/23 0525 06/10/23 0440  WBC 19.0* 33.3*  HGB 9.8* 10.2*  HCT 31.2* 32.3*  PLT 99* 136*   BMET Recent Labs    06/09/23 0525 06/10/23 1403  NA 137 131*  K 3.8 4.1  CL 110 105  CO2 24 19*  GLUCOSE 145* 125*  BUN 20 29*  CREATININE 0.78 1.41*  CALCIUM  9.5 9.7   PT/INR No results for input(s): "LABPROT", "INR" in the last 72 hours. ABG No results for input(s): "PHART", "HCO3" in the last 72 hours.  Invalid input(s): "PCO2", "PO2"  Studies/Results: CT ABDOMEN PELVIS W CONTRAST Result Date: 06/10/2023 CLINICAL DATA:  Colitis, immunotherapy related toxicity evaluate for interval change in degree of colitis, worsening leukocytosis and lack of improvement in abdominal pain EXAM: CT ABDOMEN AND PELVIS WITH CONTRAST TECHNIQUE: Multidetector CT imaging of the abdomen and pelvis was performed using the standard protocol following bolus administration of intravenous contrast. RADIATION DOSE REDUCTION: This exam was performed according to the departmental dose-optimization program which includes automated exposure control, adjustment of the mA and/or kV according to patient size and/or use of iterative  reconstruction technique. CONTRAST:  80mL OMNIPAQUE  IOHEXOL  300 MG/ML  SOLN COMPARISON:  CT 06/05/2023 FINDINGS: Lower chest: Elevated right hemidiaphragm with adjacent compressive atelectasis/scarring in the right middle and lower lobes. Dependent atelectasis in the left lower lobe. The known right lower lobe pulmonary nodule is obscured by atelectasis on the current exam, series 4, image 26. Hepatobiliary: Cirrhotic liver morphology. No focal hepatic lesion. Clips in the gallbladder fossa postcholecystectomy. No biliary dilatation. Pancreas: Fatty atrophy. No disproportionate inflammation. Edema is likely related to ascites and unchanged. Spleen: No splenomegaly.  No focal abnormality. Adrenals/Urinary Tract: No adrenal nodule. Left nephrectomy with stable appearance of the nephrectomy bed. Stable chronic scarring in the mid lower right kidney. No right hydronephrosis. No right renal inflammation. Partially distended urinary bladder. There is air in the non dependent bladder. Stomach/Bowel: Administered enteric contrast reaches the descending colon. Persistent distal descending and proximal sigmoid colonic wall thickening in the region of multiple diverticula. There is no progressive bowel inflammation from prior. Interval drainage of pericolonic abscess with a drainage catheter is adjacent to the colon. The heterogeneous collection is not definitively seen, there is currently minimally complex fluid adjacent to the drain on the current exam, measuring 4.4 x 2.7 cm, series 2, image 89. This previously measured 4.7 x 3.7 cm. The lateral aspect of this collection abuts the urinary bladder. There are no new sites of bowel inflammation or wall thickening. No small bowel wall thickening or inflammation. Unremarkable appearance of the stomach. There are perigastric and paraesophageal varices. Vascular/Lymphatic: Aortic atherosclerosis without aneurysm. The portal vein is patent. There  are perigastric and paraesophageal  varices as well as left upper quadrant varices. Prominent right epicardial nodes are unchanged. No definite enlarged abdominopelvic lymph nodes. Reproductive: Status post hysterectomy. No adnexal masses. Other: Moderate amount of free air is seen in the anterior omentum, for example series 2, image 63. Patchy locules of free air present extending from the subdiaphragmatic region to the pelvis. Moderate volume abdominopelvic ascites, mildly increased from prior exam, predominantly simple fluid. Musculoskeletal: Posterior spurring at L1-L2 causes narrowing of the spinal canal. Stable sclerotic lesion in the left iliac bone. Small sclerotic lesion in the right ischium is again seen. No new osseous abnormalities over the last 5 days. IMPRESSION: 1. Interval drainage of pericolonic abscess with a drainage catheter adjacent to the colon. The heterogeneous collection is not definitively seen, there is currently a minimally complex fluid adjacent to the drain on the current exam, measuring 4.4 x 2.7 cm. This previously measured 4.7 x 3.7 cm. This may represent a residual collection, less likely loculated ascites. 2. Moderate amount of free air in the anterior abdomen and pelvis. This was not seen on prior exam. This could be related to recent procedure and drain flushing, however difficult to exclude colonic perforation in this setting. Persistent distal descending and proximal sigmoid colonic wall thickening in the region of multiple diverticula, without progressive bowel inflammation. Continued clinical follow-up is needed. 3. Cirrhosis with sequela of portal hypertension including varices. Moderate volume abdominopelvic ascites, mildly increased from prior exam, predominantly simple fluid. 4. Additional stable chronic findings. Aortic Atherosclerosis (ICD10-I70.0). These results were called by telephone at the time of interpretation on 06/10/2023 at 6:05 pm to the patient's nurse, no physician was available at the time of  the exam interpretation to receive results. Patient's nurse will get in touch with the doctor and convey these results. Electronically Signed   By: Chadwick Colonel M.D.   On: 06/10/2023 18:11    Anti-infectives: Anti-infectives (From admission, onward)    Start     Dose/Rate Route Frequency Ordered Stop   06/10/23 2200  metroNIDAZOLE  (FLAGYL ) IVPB 500 mg        500 mg 100 mL/hr over 60 Minutes Intravenous Every 8 hours 06/10/23 2103     06/06/23 2200  metroNIDAZOLE  (FLAGYL ) tablet 500 mg  Status:  Discontinued        500 mg Oral Every 12 hours 06/06/23 0951 06/10/23 2103   05/31/23 1600  vancomycin  (VANCOREADY) IVPB 1250 mg/250 mL  Status:  Discontinued        1,250 mg 166.7 mL/hr over 90 Minutes Intravenous Every 48 hours 05/29/23 1437 05/30/23 1416   05/31/23 1445  fidaxomicin  (DIFICID ) tablet 200 mg        200 mg Oral 2 times daily 05/31/23 1353 06/10/23 0959   05/31/23 1345  vancomycin  (VANCOCIN ) capsule 125 mg  Status:  Discontinued        125 mg Oral Daily 05/31/23 1253 05/31/23 1353   05/31/23 1000  fidaxomicin  (DIFICID ) tablet 200 mg  Status:  Discontinued        200 mg Oral 2 times daily 05/31/23 0812 05/31/23 0813   05/31/23 1000  metroNIDAZOLE  (FLAGYL ) IVPB 500 mg  Status:  Discontinued        500 mg 100 mL/hr over 60 Minutes Intravenous Every 12 hours 05/31/23 0813 06/06/23 0951   05/29/23 2300  cefTRIAXone  (ROCEPHIN ) 2 g in sodium chloride  0.9 % 100 mL IVPB        2 g 200 mL/hr over 30  Minutes Intravenous Every 24 hours 05/29/23 2211     05/29/23 1700  ceFEPIme  (MAXIPIME ) 2 g in sodium chloride  0.9 % 100 mL IVPB  Status:  Discontinued        2 g 200 mL/hr over 30 Minutes Intravenous Every 12 hours 05/29/23 1437 05/29/23 2211   05/29/23 1600  metroNIDAZOLE  (FLAGYL ) IVPB 500 mg  Status:  Discontinued        500 mg 100 mL/hr over 60 Minutes Intravenous Every 12 hours 05/29/23 1511 05/31/23 0812   05/29/23 1500  vancomycin  (VANCOREADY) IVPB 2000 mg/400 mL        2,000  mg 200 mL/hr over 120 Minutes Intravenous  Once 05/29/23 1437 05/29/23 1724   05/29/23 0430  ceFEPIme  (MAXIPIME ) 2 g in sodium chloride  0.9 % 100 mL IVPB        2 g 200 mL/hr over 30 Minutes Intravenous  Once 05/29/23 0416 05/29/23 0523       Assessment/Plan: Diverticulitis with abscess and recurrent/persistent C. difficile colitis  - Repeat CT 5/6 shows abscess that may now be amenable to drainage. IR consulted, after transfusing platelets she got an IR drain 5/8. Remains poor surgical candidate, do not think she would tolerate an operation, no evidence of emergent surgical interventions necessary for her C Diff. - Suspect drain communicating with colon - Abx per ID - FLD, ok to ADAT - We reviewed her situation with the family and her.  They have no interest in surgery at this point in time.  She is extremely poor operative candidate.  ACS calculator used to determine predicted operative outcome.  Risk of serious complication was 47%, any complication 45%, risk of sepsis 19%, risk of mortality from surgery 25%, the need for nursing home 70%, likelihood of ileus 36%, surgical site and sec fraction 14%, cardiac complication 7%, pneumonia risk 8%, postop delirium 37%, the need for significant increase with a of over 75%.  She will no longer be able to receive chemotherapy if she have surgery for the next 3 months.  Overall, her quality life would be extremely poor and this would not potentially increase her lifespan significantly.  Hopefully, a second drain can be placed tomorrow and have her managed nonoperatively since her prognosis is extremely poor.  Appreciate palliative care input  FEN - As above.  VTE - SCDs, chem ppx on hold due to significant thrombocytopenia ID - Rocephin /Flagyl /Dificid  per ID. Afebrile. WBC 33.0   Pancytopenia likely secondary to chemotherapy - improving Leukocytosis secondary likely to G-CSF and possibly infection E. 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

## 2023-06-11 NOTE — Progress Notes (Signed)
 Referring Physician(s): Burton,V  Supervising Physician: Art Largo  Patient Status:  [facility] - In-pt  Chief Complaint: Abdominal-pelvic pain/ diverticular abscess, diarrhea, lung cancer; s/p pelvic drain placement 5/8    Subjective: Pt doing ok; still having some loose stools; denies worsening abd pain,N/V   Allergies: Culturelle probiotics [lactobacillus] and Bactrim  ds [sulfamethoxazole -trimethoprim ]  Medications: Prior to Admission medications   Medication Sig Start Date End Date Taking? Authorizing Provider  allopurinol  (ZYLOPRIM ) 300 MG tablet TAKE 1 TABLET BY MOUTH EVERY DAY 03/15/23  Yes Neda Balk, MD  aspirin  EC 81 MG tablet Take 81 mg by mouth daily.   Yes [provider]  atorvastatin  (LIPITOR) 20 MG tablet TAKE 1 TABLET BY MOUTH EVERY DAY 05/21/23  Yes Neda Balk, MD  carvedilol  (COREG ) 3.125 MG tablet Take 0.5 tablets (1.5625 mg total) by mouth 2 (two) times daily with a meal. 02/20/23  Yes Byrum, Delora Ferry, MD  dexamethasone  (DECADRON ) 4 MG tablet Take 2 tablets daily for 2 days, on days 4 and 5.Take with food. Every 21 days. 03/14/23  Yes Ivor Mars, MD  diphenhydramine -acetaminophen  (TYLENOL  PM) 25-500 MG TABS Take 1 tablet by mouth at bedtime as needed (sleep).   Yes [provider]  ferrous sulfate  325 (65 FE) MG tablet Take 325 mg by mouth at bedtime.   Yes [provider]  fluconazole  (DIFLUCAN ) 100 MG tablet Take 1 tablet (100 mg total) by mouth daily. 05/08/23  Yes Ennever, Sherryll Donald, MD  glimepiride  (AMARYL ) 2 MG tablet TAKE 1 TABLET BY MOUTH EVERY DAY WITH BREAKFAST 05/21/23  Yes Neda Balk, MD  hyoscyamine  (LEVSIN SL) 0.125 MG SL tablet Place 1 tablet (0.125 mg total) under the tongue every 6 (six) hours as needed. 04/16/23  Yes Neda Balk, MD  ondansetron  (ZOFRAN ) 8 MG tablet Take 1 tablet (8 mg total) by mouth every 8 (eight) hours as needed for nausea or vomiting. Start on third day after chemotherapy. 03/14/23   Yes Ennever, Sherryll Donald, MD  SEMGLEE , YFGN, 100 UNIT/ML Pen Inject 14 Units into the skin at bedtime. 12/27/22  Yes [provider]  Vitamin D , Ergocalciferol , (DRISDOL ) 1.25 MG (50000 UNIT) CAPS capsule TAKE 1 CAPSULE (50,000 UNITS TOTAL) BY MOUTH EVERY 7 (SEVEN) DAYS. 04/05/23  Yes Webb, Padonda B, FNP  zolpidem  (AMBIEN ) 5 MG tablet Take 1 tablet (5 mg total) by mouth at bedtime. 04/24/23  Yes Olalere, Adewale A, MD  glucose blood (ONETOUCH VERIO) test strip CHECK BLOOD SUGAR DAILY AS NEEDED 05/22/23   Neda Balk, MD  insulin  glargine (LANTUS ) 100 unit/mL SOPN Inject 14 Units into the skin at bedtime. 04/26/23: Reports during TOC 30-day program call: she is currently taking this insulin  14 U at bedtime: reports has not started Semglee  insulin  because she still had Lantus  at home to use- per PCP advice Patient not taking: Reported on 05/29/2023    Neda Balk, MD  Insulin  Pen Needle (BD PEN NEEDLE NANO 2ND GEN) 32G X 4 MM MISC USE AS DIRECTED WITH LANTUS  ONCE A DAY. 11/20/22   Neda Balk, MD  Lancets [facility] DELICA PLUS LANCET33G) MISC USE AS DIRECTED TO TEST DAILY 09/11/22   Neda Balk, MD  vancomycin  (VANCOCIN ) 125 MG capsule Take 125 mg by mouth 4 (four) times daily. Patient not taking: Reported on 05/28/2023 04/18/23   [provider]  glyBURIDE -metformin  (GLUCOVANCE ) 1.25-250 MG tablet Take 1 tablet by mouth 2 (two) times daily with a meal. 03/25/19 03/25/19  Neda Balk, MD     Vital Signs: BP 108/70 (BP Location: Right Arm)   Pulse (!) 126   Temp (!) 97.5 F (36.4 C) (Oral)   Resp 16   Ht 5\' 4"  (1.626 m)   Wt 197 lb 8 oz (89.6 kg)   SpO2 90%   BMI 33.90 kg/m   Physical Exam: awake/alert; left lower abd/pelvic drain intact; dressing clean and dry, site mildly tender; OP 235 feculent appearing fluid; drain flushed without difficulty; JP switched to bag drain due to high output   Imaging: CT ABDOMEN PELVIS W CONTRAST Result Date: 06/10/2023 CLINICAL  DATA:  Colitis, immunotherapy related toxicity evaluate for interval change in degree of colitis, worsening leukocytosis and lack of improvement in abdominal pain EXAM: CT ABDOMEN AND PELVIS WITH CONTRAST TECHNIQUE: Multidetector CT imaging of the abdomen and pelvis was performed using the standard protocol following bolus administration of intravenous contrast. RADIATION DOSE REDUCTION: This exam was performed according to the departmental dose-optimization program which includes automated exposure control, adjustment of the mA and/or kV according to patient size and/or use of iterative reconstruction technique. CONTRAST:  80mL OMNIPAQUE  IOHEXOL  300 MG/ML  SOLN COMPARISON:  CT 06/05/2023 FINDINGS: Lower chest: Elevated right hemidiaphragm with adjacent compressive atelectasis/scarring in the right middle and lower lobes. Dependent atelectasis in the left lower lobe. The known right lower lobe pulmonary nodule is obscured by atelectasis on the current exam, series 4, image 26. Hepatobiliary: Cirrhotic liver morphology. No focal hepatic lesion. Clips in the gallbladder fossa postcholecystectomy. No biliary dilatation. Pancreas: Fatty atrophy. No disproportionate inflammation. Edema is likely related to ascites and unchanged. Spleen: No splenomegaly.  No focal abnormality. Adrenals/Urinary Tract: No adrenal nodule. Left nephrectomy with stable appearance of the nephrectomy bed. Stable chronic scarring in the mid lower right kidney. No right hydronephrosis. No right renal inflammation. Partially distended urinary bladder. There is air in the non dependent bladder. Stomach/Bowel: Administered enteric contrast reaches the descending colon. Persistent distal descending and proximal sigmoid colonic wall thickening in the region of multiple diverticula. There is no progressive bowel inflammation from prior. Interval drainage of pericolonic abscess with a drainage catheter is adjacent to the colon. The heterogeneous  collection is not definitively seen, there is currently minimally complex fluid adjacent to the drain on the current exam, measuring 4.4 x 2.7 cm, series 2, image 89. This previously measured 4.7 x 3.7 cm. The lateral aspect of this collection abuts the urinary bladder. There are no new sites of bowel inflammation or wall thickening. No small bowel wall thickening or inflammation. Unremarkable appearance of the stomach. There are perigastric and paraesophageal varices. Vascular/Lymphatic: Aortic atherosclerosis without aneurysm. The portal vein is patent. There are perigastric and paraesophageal varices as well as left upper quadrant varices. Prominent right epicardial nodes are unchanged. No definite enlarged abdominopelvic lymph nodes. Reproductive: Status post hysterectomy. No adnexal masses. Other: Moderate amount of free air is seen in the anterior omentum, for example series 2, image 63. Patchy locules of free air present extending from the subdiaphragmatic region to the pelvis. Moderate volume abdominopelvic ascites, mildly increased from prior exam, predominantly simple fluid. Musculoskeletal: Posterior spurring at L1-L2 causes narrowing of the spinal canal. Stable sclerotic lesion in the left iliac bone. Small sclerotic lesion in the right ischium is again seen. No new osseous abnormalities over the last 5 days. IMPRESSION: 1. Interval drainage of pericolonic abscess with a drainage catheter adjacent to the colon. The heterogeneous collection is not definitively seen, there is currently a  minimally complex fluid adjacent to the drain on the current exam, measuring 4.4 x 2.7 cm. This previously measured 4.7 x 3.7 cm. This may represent a residual collection, less likely loculated ascites. 2. Moderate amount of free air in the anterior abdomen and pelvis. This was not seen on prior exam. This could be related to recent procedure and drain flushing, however difficult to exclude colonic perforation in this  setting. Persistent distal descending and proximal sigmoid colonic wall thickening in the region of multiple diverticula, without progressive bowel inflammation. Continued clinical follow-up is needed. 3. Cirrhosis with sequela of portal hypertension including varices. Moderate volume abdominopelvic ascites, mildly increased from prior exam, predominantly simple fluid. 4. Additional stable chronic findings. Aortic Atherosclerosis (ICD10-I70.0). These results were called by telephone at the time of interpretation on 06/10/2023 at 6:05 pm to the patient's nurse, no physician was available at the time of the exam interpretation to receive results. Patient's nurse will get in touch with the doctor and convey these results. Electronically Signed   By: Chadwick Colonel M.D.   On: 06/10/2023 18:11    Labs:  CBC: Recent Labs    06/08/23 0540 06/09/23 0525 06/10/23 0440 06/11/23 1127  WBC 17.1* 19.0* 33.3* 29.8*  HGB 9.8* 9.8* 10.2* 9.6*  HCT 31.4* 31.2* 32.3* 30.4*  PLT 69* 99* 136* 137*    COAGS: Recent Labs    06/06/23 1000  INR 1.3*    BMP: Recent Labs    06/08/23 0540 06/09/23 0525 06/10/23 1403 06/11/23 1127  NA 138 137 131* 135  K 3.8 3.8 4.1 4.5  CL 110 110 105 108  CO2 21* 24 19* 19*  GLUCOSE 136* 145* 125* 108*  BUN 21 20 29* 38*  CALCIUM  9.4 9.5 9.7 9.8  CREATININE 0.89 0.78 1.41* 1.31*  GFRNONAA >60 >60 38* 41*    LIVER FUNCTION TESTS: Recent Labs    06/07/23 0525 06/08/23 0540 06/09/23 0525 06/11/23 1127  BILITOT 0.5 0.4 0.7 0.5  AST 36 33 26 31  ALT 31 30 26 22   ALKPHOS 195* 190* 181* 236*  PROT 4.7* 4.7* 4.8* 4.5*  ALBUMIN 2.3* 2.4* 2.2* 1.9*    Assessment and Plan: Pt with hx lung cancer, diverticulitis with assoc abscess, c diff; s/p left lower abd/pelvic drain placement 5/8; afebrile, WBC 29.8(33.3), hgb 9.6(10.2), creat 1.31(1.41)  Output by Drain (mL) 06/09/23 0701 - 06/09/23 1900 06/09/23 1901 - 06/10/23 0700 06/10/23 0701 - 06/10/23 1900  06/10/23 1901 - 06/11/23 0700 06/11/23 0701 - 06/11/23 1502  Closed System Drain 1 Inferior;Medial Abdomen Bulb (JP) 10 Fr. 175 100 30  235   CT A/P done yesterday revealed:  1. Interval drainage of pericolonic abscess with a drainage catheter adjacent to the colon. The heterogeneous collection is not definitively seen, there is currently a minimally complex fluid adjacent to the drain on the current exam, measuring 4.4 x 2.7 cm. This previously measured 4.7 x 3.7 cm. This may represent a residual collection, less likely loculated ascites. 2. Moderate amount of free air in the anterior abdomen and pelvis. This was not seen on prior exam. This could be related to recent procedure and drain flushing, however difficult to exclude colonic perforation in this setting. Persistent distal descending and proximal sigmoid colonic wall thickening in the region of multiple diverticula, without progressive bowel inflammation. Continued clinical follow-up is needed. 3. Cirrhosis with sequela of portal hypertension including varices. Moderate volume abdominopelvic ascites, mildly increased from prior exam, predominantly simple fluid. 4. Additional stable chronic  findings.   Aortic Atherosclerosis  Imaging was reviewed by Dr. Darylene Epley; cont current tx; no additional abd drain planned; JP bulb switched to gravity bag due to high output with feculent appearing fluid ; will need drain injection once output minimal    Electronically Signed: D. Honore Lux, PA-C 06/11/2023, 2:58 PM   I spent a total of 15 Minutes at the the patient's bedside AND on the patient's hospital floor or unit, greater than 50% of which was counseling/coordinating care for  lower abdominal/pelvic abscess drain     Patient ID: Amanda Crawford, female   DOB: [birth year], 78 y.o.   MRN: [id number]

## 2023-06-11 NOTE — Progress Notes (Signed)
 [facility]  Date of Admission:  05/29/2023   Total days of inpatient antibiotics 13  Principal Problem:   Sepsis (HCC) Active Problems:   Neutropenia (HCC)   C. difficile colitis   Abscess of sigmoid colon   Counseling and coordination of care   Enteritis due to Clostridium difficile   DNR (do not resuscitate) discussion   Goals of care, counseling/discussion   Palliative care encounter          Assessment: 78 year old female with: #C. difficile colitis - Status post Dificid  x 10 #diverticulitis complicated intra-abdominal abscess post drain placement #E. coli bacteremia #Neutropenic fever - Initial plan was to treat with ceftriaxone  x 2 weeks then transition to Augmentin  for about 4 more weeks. - Repeat CT showed hydrogenous collection of minimally complex fluid adjacent to the drain measuring 4.4X 2.770 m previously measured 4.7X 3.7 cm.  Moderate amount of free air in anterior abdomen pelvis. - Per surgery today, second drain can be placed tomorrow for nonoperative management.  Poor prognosis. Recommendations: -tart ppx  vanc po for  c diff -Continue ceftriaxone  and metronidazole  -Follow Cx speciation -Follow drain cultures if secondary in place.  Evaluation of this patient requires complex antimicrobial therapy evaluation and counseling + isolation needs for disease transmission risk assessment and mitigation     Microbiology:   Antibiotics: Cefadroxil - 5/1 - 5/10 Ceftriaxone /20-present Metronidazole /29-present Cultures: Blood /29 2/2 E. coli 5/1 no growth  Urine  Other  5/8 moderate GNR SUBJECTIVE: Sitting in bed.  Family at bedside. Interval: Afebrile overnight.  WBC 33K on 5/10.  Review of Systems: Review of Systems  All other systems reviewed and are negative.    Scheduled Meds:  Chlorhexidine  Gluconate Cloth  6 each Topical Daily   Gerhardt's butt cream   Topical TID   insulin  aspart  0-15 Units  Subcutaneous TID WC   insulin  aspart  0-5 Units Subcutaneous QHS   insulin  glargine-yfgn  14 Units Subcutaneous QHS   sodium chloride  flush  10-40 mL Intracatheter Q12H   sodium chloride  flush  5 mL Intracatheter Q8H   vancomycin   125 mg Oral BID   Continuous Infusions:  sodium chloride  75 mL/hr at 06/11/23 0510   cefTRIAXone  (ROCEPHIN )  IV Stopped (06/10/23 2341)   metronidazole  500 mg (06/11/23 0629)   PRN Meds:.albuterol , morphine  injection, [DISCONTINUED] ondansetron  **OR** ondansetron  (ZOFRAN ) IV, mouth rinse, sodium chloride  flush Allergies  Allergen Reactions   Culturelle Probiotics [Lactobacillus]     Patient should not take probiotics while immunocompromised from chemo    Bactrim  Ds [Sulfamethoxazole -Trimethoprim ] Rash    OBJECTIVE: Vitals:   06/10/23 0636 06/10/23 1454 06/10/23 2135 06/11/23 0633  BP: 122/61 131/68 112/61 (!) 118/59  Pulse:  79    Resp: 20 18 18 18   Temp: 97.8 F (36.6 C) 97.8 F (36.6 C) 97.6 F (36.4 C) 97.6 F (36.4 C)  TempSrc: Oral Oral Oral Oral  SpO2: 96% 92% 91% 93%  Weight:      Height:       Body mass index is 33.9 kg/m.  Physical Exam Constitutional:      Appearance: Normal appearance.  HENT:     Head: Normocephalic and atraumatic.     Right Ear: Tympanic membrane normal.     Left Ear: Tympanic membrane normal.     Nose: Nose normal.     Mouth/Throat:     Mouth: Mucous membranes are moist.  Eyes:     Extraocular  Movements: Extraocular movements intact.     Conjunctiva/sclera: Conjunctivae normal.     Pupils: Pupils are equal, round, and reactive to light.  Cardiovascular:     Rate and Rhythm: Normal rate and regular rhythm.     Heart sounds: No murmur heard.    No friction rub. No gallop.  Pulmonary:     Effort: Pulmonary effort is normal.     Breath sounds: Normal breath sounds.  Abdominal:     Comments: drain  Musculoskeletal:        General: Normal range of motion.  Skin:    General: Skin is warm and dry.   Neurological:     General: No focal deficit present.     Mental Status: She is alert and oriented to person, place, and time.  Psychiatric:        Mood and Affect: Mood normal.       Lab Results Lab Results  Component Value Date   WBC 33.3 (H) 06/10/2023   HGB 10.2 (L) 06/10/2023   HCT 32.3 (L) 06/10/2023   MCV 105.2 (H) 06/10/2023   PLT 136 (L) 06/10/2023    Lab Results  Component Value Date   CREATININE 1.31 (H) 06/11/2023   BUN 38 (H) 06/11/2023   NA 135 06/11/2023   K 4.5 06/11/2023   CL 108 06/11/2023   CO2 19 (L) 06/11/2023    Lab Results  Component Value Date   ALT 22 06/11/2023   AST 31 06/11/2023   ALKPHOS 236 (H) 06/11/2023   BILITOT 0.5 06/11/2023        Orlie Bjornstad, MD [facility] 06/11/2023, 1:26 PM

## 2023-06-11 NOTE — Progress Notes (Addendum)
 Patient not having any urine output today. MD Nettey notified.  Bladder scan performed @ 3pm showing she was holding . MD notified and he and this nurse agreed that if pt does not urinate to repeat bladder scan to possibly contemplate putting foley catheter back in for urinary retention.   MD Nettey Notified @ 1:30 about a change in the patients satus. Pt tachycardic and went into yellow muse.  @ 1347 MD Nettey ordered EKG. EKG performed and MD notified of results.   @1534  patient O2 sats 88%, pt put on 3L [place] and sats at 98%  Wound care performed @ 1124 and @ 1420 w/ Vashe, Gerhardt's butt cream, and sacral foam placed.    JP output (bulb changed to drainage bag due to excess drainage)

## 2023-06-12 ENCOUNTER — Encounter: Payer: Self-pay | Admitting: *Deleted

## 2023-06-12 ENCOUNTER — Other Ambulatory Visit: Payer: Self-pay

## 2023-06-12 ENCOUNTER — Telehealth: Payer: Self-pay

## 2023-06-12 ENCOUNTER — Inpatient Hospital Stay

## 2023-06-12 DIAGNOSIS — K630 Abscess of intestine: Secondary | ICD-10-CM | POA: Diagnosis not present

## 2023-06-12 DIAGNOSIS — N179 Acute kidney failure, unspecified: Secondary | ICD-10-CM | POA: Diagnosis not present

## 2023-06-12 DIAGNOSIS — A0472 Enterocolitis due to Clostridium difficile, not specified as recurrent: Secondary | ICD-10-CM | POA: Diagnosis not present

## 2023-06-12 DIAGNOSIS — Z7189 Other specified counseling: Secondary | ICD-10-CM | POA: Diagnosis not present

## 2023-06-12 DIAGNOSIS — A4151 Sepsis due to Escherichia coli [E. coli]: Secondary | ICD-10-CM | POA: Diagnosis not present

## 2023-06-12 DIAGNOSIS — Z515 Encounter for palliative care: Secondary | ICD-10-CM | POA: Diagnosis not present

## 2023-06-12 DIAGNOSIS — C342 Malignant neoplasm of middle lobe, bronchus or lung: Secondary | ICD-10-CM | POA: Diagnosis not present

## 2023-06-12 LAB — COMPREHENSIVE METABOLIC PANEL WITH GFR
ALT: 24 U/L (ref 0–44)
AST: 36 U/L (ref 15–41)
Albumin: 1.8 g/dL — ABNORMAL LOW (ref 3.5–5.0)
Alkaline Phosphatase: 363 U/L — ABNORMAL HIGH (ref 38–126)
Anion gap: 7 (ref 5–15)
BUN: 44 mg/dL — ABNORMAL HIGH (ref 8–23)
CO2: 19 mmol/L — ABNORMAL LOW (ref 22–32)
Calcium: 9.9 mg/dL (ref 8.9–10.3)
Chloride: 107 mmol/L (ref 98–111)
Creatinine, Ser: 1.38 mg/dL — ABNORMAL HIGH (ref 0.44–1.00)
GFR, Estimated: 39 mL/min — ABNORMAL LOW (ref >60)
Glucose, Bld: 134 mg/dL — ABNORMAL HIGH (ref 70–99)
Potassium: 4.6 mmol/L (ref 3.5–5.1)
Sodium: 133 mmol/L — ABNORMAL LOW (ref 135–145)
Total Bilirubin: 0.8 mg/dL (ref 0.0–1.2)
Total Protein: 4.5 g/dL — ABNORMAL LOW (ref 6.5–8.1)

## 2023-06-12 LAB — CBC WITH DIFFERENTIAL/PLATELET
Abs Immature Granulocytes: 0.99 10*3/uL — ABNORMAL HIGH (ref 0.00–0.07)
Basophils Absolute: 0.2 10*3/uL — ABNORMAL HIGH (ref 0.0–0.1)
Basophils Relative: 1 %
Eosinophils Absolute: 0 10*3/uL (ref 0.0–0.5)
Eosinophils Relative: 0 %
HCT: 30 % — ABNORMAL LOW (ref 36.0–46.0)
Hemoglobin: 9.7 g/dL — ABNORMAL LOW (ref 12.0–15.0)
Immature Granulocytes: 4 %
Lymphocytes Relative: 5 %
Lymphs Abs: 1.5 10*3/uL (ref 0.7–4.0)
MCH: 33.9 pg (ref 26.0–34.0)
MCHC: 32.3 g/dL (ref 30.0–36.0)
MCV: 104.9 fL — ABNORMAL HIGH (ref 80.0–100.0)
Monocytes Absolute: 1.5 10*3/uL — ABNORMAL HIGH (ref 0.1–1.0)
Monocytes Relative: 5 %
Neutro Abs: 24.3 10*3/uL — ABNORMAL HIGH (ref 1.7–7.7)
Neutrophils Relative %: 85 %
Platelets: 122 10*3/uL — ABNORMAL LOW (ref 150–400)
RBC: 2.86 MIL/uL — ABNORMAL LOW (ref 3.87–5.11)
RDW: 20.1 % — ABNORMAL HIGH (ref 11.5–15.5)
WBC: 28.6 10*3/uL — ABNORMAL HIGH (ref 4.0–10.5)
nRBC: 0 % (ref 0.0–0.2)

## 2023-06-12 LAB — AEROBIC/ANAEROBIC CULTURE W GRAM STAIN (SURGICAL/DEEP WOUND)

## 2023-06-12 LAB — GLUCOSE, CAPILLARY
Glucose-Capillary: 130 mg/dL — ABNORMAL HIGH (ref 70–99)
Glucose-Capillary: 128 mg/dL — ABNORMAL HIGH (ref 70–99)
Glucose-Capillary: 146 mg/dL — ABNORMAL HIGH (ref 70–99)
Glucose-Capillary: 148 mg/dL — ABNORMAL HIGH (ref 70–99)

## 2023-06-12 MED ORDER — SODIUM CHLORIDE 0.9 % IV SOLN
2.0000 g | Freq: Two times a day (BID) | INTRAVENOUS | Status: DC
  Administered 2023-06-12 – 2023-06-14 (×5): 2 g via INTRAVENOUS
  Filled 2023-06-12 (×5): qty 12.5

## 2023-06-12 MED ORDER — DEXTROSE-SODIUM CHLORIDE 5-0.45 % IV SOLN: INTRAVENOUS | Status: AC

## 2023-06-12 MED ORDER — SODIUM CHLORIDE 0.9 % IV BOLUS
1000.0000 mL | Freq: Once | INTRAVENOUS | Status: AC
  Administered 2023-06-12: 1000 mL via INTRAVENOUS

## 2023-06-12 NOTE — Plan of Care (Signed)

## 2023-06-12 NOTE — Progress Notes (Signed)
 Subjective/Chief Complaint: Patient more short of breath.  Still having abdominal pain.  Appears more fatigued today.   Objective: Vital signs in last 24 hours: Temp:  [97.4 F (36.3 C)-98.7 F (37.1 C)] 97.5 F (36.4 C) (05/13 0334) Pulse Rate:  [115-126] 117 (05/13 0812) Resp:  [16-20] 16 (05/13 0812) BP: (108-135)/(49-76) 117/52 (05/13 0812) SpO2:  [88 %-98 %] 95 % (05/13 0812) Last BM Date : 06/11/23  Intake/Output from previous day: 05/12 0701 - 05/13 0700 In: 1735.9 [P.O.:240; I.V.:1090.9; IV Piggyback:400] Out: 935 [Urine:700; Drains:235] Intake/Output this shift: No intake/output data recorded.  Abdomen: Soft tender around the drain.  No rebound or guarding though.  Lab Results:  Recent Labs    06/11/23 1127 06/12/23 0525  WBC 29.8* 28.6*  HGB 9.6* 9.7*  HCT 30.4* 30.0*  PLT 137* 122*   BMET Recent Labs    06/11/23 1127 06/12/23 0525  NA 135 133*  K 4.5 4.6  CL 108 107  CO2 19* 19*  GLUCOSE 108* 134*  BUN 38* 44*  CREATININE 1.31* 1.38*  CALCIUM  9.8 9.9   PT/INR No results for input(s): "LABPROT", "INR" in the last 72 hours. ABG No results for input(s): "PHART", "HCO3" in the last 72 hours.  Invalid input(s): "PCO2", "PO2"  Studies/Results: DG CHEST PORT 1 VIEW Result Date: 06/11/2023 CLINICAL DATA:  Hypoxia EXAM: PORTABLE CHEST 1 VIEW COMPARISON:  05/29/2023 FINDINGS: Cardiac shadow is stable. Postsurgical changes are noted on the left. Right chest wall port is noted in satisfactory position. Mild bibasilar atelectasis is noted. IMPRESSION: Mild bibasilar atelectasis. Electronically Signed   By: Violeta Grey M.D.   On: 06/11/2023 21:05   CT ABDOMEN PELVIS W CONTRAST Result Date: 06/10/2023 CLINICAL DATA:  Colitis, immunotherapy related toxicity evaluate for interval change in degree of colitis, worsening leukocytosis and lack of improvement in abdominal pain EXAM: CT ABDOMEN AND PELVIS WITH CONTRAST TECHNIQUE: Multidetector CT imaging of the  abdomen and pelvis was performed using the standard protocol following bolus administration of intravenous contrast. RADIATION DOSE REDUCTION: This exam was performed according to the departmental dose-optimization program which includes automated exposure control, adjustment of the mA and/or kV according to patient size and/or use of iterative reconstruction technique. CONTRAST:  80mL OMNIPAQUE  IOHEXOL  300 MG/ML  SOLN COMPARISON:  CT 06/05/2023 FINDINGS: Lower chest: Elevated right hemidiaphragm with adjacent compressive atelectasis/scarring in the right middle and lower lobes. Dependent atelectasis in the left lower lobe. The known right lower lobe pulmonary nodule is obscured by atelectasis on the current exam, series 4, image 26. Hepatobiliary: Cirrhotic liver morphology. No focal hepatic lesion. Clips in the gallbladder fossa postcholecystectomy. No biliary dilatation. Pancreas: Fatty atrophy. No disproportionate inflammation. Edema is likely related to ascites and unchanged. Spleen: No splenomegaly.  No focal abnormality. Adrenals/Urinary Tract: No adrenal nodule. Left nephrectomy with stable appearance of the nephrectomy bed. Stable chronic scarring in the mid lower right kidney. No right hydronephrosis. No right renal inflammation. Partially distended urinary bladder. There is air in the non dependent bladder. Stomach/Bowel: Administered enteric contrast reaches the descending colon. Persistent distal descending and proximal sigmoid colonic wall thickening in the region of multiple diverticula. There is no progressive bowel inflammation from prior. Interval drainage of pericolonic abscess with a drainage catheter is adjacent to the colon. The heterogeneous collection is not definitively seen, there is currently minimally complex fluid adjacent to the drain on the current exam, measuring 4.4 x 2.7 cm, series 2, image 89. This previously measured 4.7 x 3.7 cm. The  lateral aspect of this collection abuts the  urinary bladder. There are no new sites of bowel inflammation or wall thickening. No small bowel wall thickening or inflammation. Unremarkable appearance of the stomach. There are perigastric and paraesophageal varices. Vascular/Lymphatic: Aortic atherosclerosis without aneurysm. The portal vein is patent. There are perigastric and paraesophageal varices as well as left upper quadrant varices. Prominent right epicardial nodes are unchanged. No definite enlarged abdominopelvic lymph nodes. Reproductive: Status post hysterectomy. No adnexal masses. Other: Moderate amount of free air is seen in the anterior omentum, for example series 2, image 63. Patchy locules of free air present extending from the subdiaphragmatic region to the pelvis. Moderate volume abdominopelvic ascites, mildly increased from prior exam, predominantly simple fluid. Musculoskeletal: Posterior spurring at L1-L2 causes narrowing of the spinal canal. Stable sclerotic lesion in the left iliac bone. Small sclerotic lesion in the right ischium is again seen. No new osseous abnormalities over the last 5 days. IMPRESSION: 1. Interval drainage of pericolonic abscess with a drainage catheter adjacent to the colon. The heterogeneous collection is not definitively seen, there is currently a minimally complex fluid adjacent to the drain on the current exam, measuring 4.4 x 2.7 cm. This previously measured 4.7 x 3.7 cm. This may represent a residual collection, less likely loculated ascites. 2. Moderate amount of free air in the anterior abdomen and pelvis. This was not seen on prior exam. This could be related to recent procedure and drain flushing, however difficult to exclude colonic perforation in this setting. Persistent distal descending and proximal sigmoid colonic wall thickening in the region of multiple diverticula, without progressive bowel inflammation. Continued clinical follow-up is needed. 3. Cirrhosis with sequela of portal hypertension  including varices. Moderate volume abdominopelvic ascites, mildly increased from prior exam, predominantly simple fluid. 4. Additional stable chronic findings. Aortic Atherosclerosis (ICD10-I70.0). These results were called by telephone at the time of interpretation on 06/10/2023 at 6:05 pm to the patient's nurse, no physician was available at the time of the exam interpretation to receive results. Patient's nurse will get in touch with the doctor and convey these results. Electronically Signed   By: Chadwick Colonel M.D.   On: 06/10/2023 18:11    Anti-infectives: Anti-infectives (From admission, onward)    Start     Dose/Rate Route Frequency Ordered Stop   06/11/23 1100  vancomycin  (VANCOCIN ) capsule 125 mg        125 mg Oral 2 times daily 06/11/23 1006     06/10/23 2200  metroNIDAZOLE  (FLAGYL ) IVPB 500 mg        500 mg 100 mL/hr over 60 Minutes Intravenous Every 8 hours 06/10/23 2103     06/06/23 2200  metroNIDAZOLE  (FLAGYL ) tablet 500 mg  Status:  Discontinued        500 mg Oral Every 12 hours 06/06/23 0951 06/10/23 2103   05/31/23 1600  vancomycin  (VANCOREADY) IVPB 1250 mg/250 mL  Status:  Discontinued        1,250 mg 166.7 mL/hr over 90 Minutes Intravenous Every 48 hours 05/29/23 1437 05/30/23 1416   05/31/23 1445  fidaxomicin  (DIFICID ) tablet 200 mg        200 mg Oral 2 times daily 05/31/23 1353 06/10/23 0959   05/31/23 1345  vancomycin  (VANCOCIN ) capsule 125 mg  Status:  Discontinued        125 mg Oral Daily 05/31/23 1253 05/31/23 1353   05/31/23 1000  fidaxomicin  (DIFICID ) tablet 200 mg  Status:  Discontinued  200 mg Oral 2 times daily 05/31/23 0812 05/31/23 0813   05/31/23 1000  metroNIDAZOLE  (FLAGYL ) IVPB 500 mg  Status:  Discontinued        500 mg 100 mL/hr over 60 Minutes Intravenous Every 12 hours 05/31/23 0813 06/06/23 0951   05/29/23 2300  cefTRIAXone  (ROCEPHIN ) 2 g in sodium chloride  0.9 % 100 mL IVPB        2 g 200 mL/hr over 30 Minutes Intravenous Every 24 hours  05/29/23 2211     05/29/23 1700  ceFEPIme  (MAXIPIME ) 2 g in sodium chloride  0.9 % 100 mL IVPB  Status:  Discontinued        2 g 200 mL/hr over 30 Minutes Intravenous Every 12 hours 05/29/23 1437 05/29/23 2211   05/29/23 1600  metroNIDAZOLE  (FLAGYL ) IVPB 500 mg  Status:  Discontinued        500 mg 100 mL/hr over 60 Minutes Intravenous Every 12 hours 05/29/23 1511 05/31/23 0812   05/29/23 1500  vancomycin  (VANCOREADY) IVPB 2000 mg/400 mL        2,000 mg 200 mL/hr over 120 Minutes Intravenous  Once 05/29/23 1437 05/29/23 1724   05/29/23 0430  ceFEPIme  (MAXIPIME ) 2 g in sodium chloride  0.9 % 100 mL IVPB        2 g 200 mL/hr over 30 Minutes Intravenous  Once 05/29/23 0416 05/29/23 0523       Assessment/Plan:  Diverticulitis with abscess and recurrent/persistent C. difficile colitis  - Repeat CT 5/6 shows abscess that may now be amenable to drainage. IR consulted, after transfusing platelets she got an IR drain 5/8. Remains poor surgical candidate, do not think she would tolerate an operation, no evidence of emergent surgical interventions necessary for her C Diff. - Suspect drain communicating with colon - Abx per ID - FLD, ok to ADAT - We reviewed her situation with the family and her.  They have no interest in surgery at this point in time.  She is extremely poor operative candidate.   ACS calculator used to determine predicted operative outcome.  Risk of serious complication was 47%, any complication 65 %, risk of sepsis 19%, risk of mortality from surgery 50%, the need for nursing home 70%, likelihood of ileus 36%, surgical site and sec fraction 14%, cardiac complication 7%, pneumonia risk 8%, postop delirium 37%, the need for significant increase with a of over 75%.   She seems to be progressively worsening.  Given her cirrhosis and other comorbidities, her operative risk is prohibitive at this point.  Recommend palliative care transition.  She is undergoing a study today to look at drain  repositioning.  A second drain was not recommended at this time.  No role for surgery given her overall condition since it will not prolong her life at all, reduce any life she has from a quality life standpoint and she has an exceedingly an acceptable high rate of mortality with no benefit. Surgery will sign off. Appreciate palliative care input   FEN - As above.  VTE - SCDs, chem ppx on hold due to significant thrombocytopenia ID - Rocephin /Flagyl /Dificid  per ID. Afebrile. WBC 33.0   Pancytopenia likely secondary to chemotherapy - improving Leukocytosis secondary likely to G-CSF and possibly infection E. coli bacteremia Cirrhosis Small cell lung cancer, currently on chemotherapy with metastatic disease Diabetes mellitus GERD History of renal cell cancer, status post left nephrectomy Hypertension Obesity Chronic kidney disease Hyperlipidemia   LOS: 14 days    Andy Bannister A Alroy Portela 06/12/2023 Moderate

## 2023-06-12 NOTE — Progress Notes (Addendum)
 PROGRESS NOTE    Amanda Crawford  BJY:[id number] DOB: Jun 04, 1945 DOA: 05/29/2023 PCP: Neda Balk, MD   Brief Narrative: Amanda Crawford is a 78 y.o. female with a history of small cell lung cancer on chemotherapy, renal cell cancer with lung metastasis status post wedge resection, CKD stage III, diabetes mellitus, C. difficile, diverticulitis.  Patient presented secondary to weakness, confusion, neutropenia with evidence of sepsis secondary to sigmoid diverticulitis with abscess,  recurrent C. difficile colitis and E. coli bacteremia.  General surgery consulted for diverticulitis with abscess and ID consulted for E. coli bacteremia and C. difficile colitis management. Repeat CT shows worsening abscess with concern for possible free air.   Assessment and Plan:  Sepsis Present on admission.  Secondary to sigmoid diverticulitis with associated abscess in addition to E. coli bacteremia.  Complicated by immunocompromise status.  Sigmoid diverticulitis and abscess General Surgery consulted.  Recommendation for percutaneous drain per IR which was deferred initially.  ID consulted with antibiotic regimen recommendations.  Repeat CT abdomen/pelvis significant for enlarging abscess and continue recommendation for IR drain placement.  CT guided drain placed on 5/8. Wound culture significant for multiple organisms with non predominant. Leukocytosis initially worsened secondary to Neupogen  with improvement after drain placement; leukocytosis now significantly worsened and stabilized. Repeat CT concerning for worsening abscess and free suggesting possible bowel perforation. Surgery declined by patient. IR consulted for placement of an additional drain, which was deferred. Instead, IR switched JP bulb to gravity bag with plan for drain injection once output improves. Palliative care consulted and patient transitioned to DNR but will continue medical care. - General surgery recommendations: IR re-consult for  second percutaneous drain placement - Continue ceftriaxone  and Flagyl  - CBC in AM  C. difficile colitis ID consulted.  Patient management fidaxomicin . - ID recommendations: Fidaxomicin , started on 5/1 and completed. Vancomycin  started for prophylaxis  E. coli bacteremia Likely source is from diverticulitis/abscess.  Patient is on appropriate antibiotics as mentioned above.  Pancytopenia Secondary to chemotherapy treatment.  Associated neutropenia.  Patient given Neupogen  for management of neutropenia in setting of bacterial infection.  Platelets significantly improved. Patient received 1 unit of platelets on 5/8. Pancytopenia resolved.  Small cell lung cancer Metastatic disease to bone Currently on chemotherapy as an outpatient per oncology.  Diabetes mellitus type 2 Well controlled for age based on hemoglobin A1C of 7.6%. - Continue SSI and Semglee   AKI on CKD stage IIIb AKI secondary to poor oral intake. - Continue IV fluids - AM metabolic panel  Primary hypertension Coreg  held secondary to low blood pressure.  Cirrhosis of  the liver Newly diagnosed on CT imaging. Recommendation for outpatient GI follow-up.  Acute urinary retention Foley catheter placed on 5/3. Foley catheter removed on 5/10. Patient is voiding spontaneously. Acute retention appears to be resolved.  Hyperlipidemia Patient's Lipitor held secondary to cirrhosis.   DVT prophylaxis: SCDs Code Status:   Code Status: Limited: Do not attempt resuscitation (DNR) -DNR-LIMITED -Do Not Intubate/DNI  Family Communication: Daughter and husband at bedside. Disposition Plan: Discharge possibly home pending ongoing specialist recommendations/management and transition to outpatient antibiotics.   Consultants:  General Surgery Infectious disease Interventional radiology  Procedures:  CT guided percutaneous drain placement  Antimicrobials: Vancomycin  IV &  PO Flagyl  Fidaxomicin  Ceftriaxone  Cefepime    Subjective: Abdominal pain is improved today. No issues overnight.  Objective: BP (!) 117/52 (BP Location: Left Arm)   Pulse (!) 117   Temp (!) 97.5 F (36.4 C) (Oral)   Resp 16  Ht 5\' 4"  (1.626 m)   Wt 89.6 kg   SpO2 95%   BMI 33.90 kg/m   Examination:  General exam: Appears calm and comfortable Respiratory system: Clear to auscultation. Respiratory effort normal. Cardiovascular system: S1 & S2 heard, RRR. No murmurs. Gastrointestinal system: Abdomen is mildly distended, soft and mildly tender especially in right side. Normal bowel sounds heard. Central nervous system: Alert and oriented. No focal neurological deficits. Psychiatry: Judgement and insight appear normal. Mood & affect appropriate.    Data Reviewed: I have personally reviewed following labs and imaging studies  CBC Lab Results  Component Value Date   WBC 28.6 (H) 06/12/2023   RBC 2.86 (L) 06/12/2023   HGB 9.7 (L) 06/12/2023   HCT 30.0 (L) 06/12/2023   MCV 104.9 (H) 06/12/2023   MCH 33.9 06/12/2023   PLT 122 (L) 06/12/2023   MCHC 32.3 06/12/2023   RDW 20.1 (H) 06/12/2023   LYMPHSABS 1.5 06/12/2023   MONOABS 1.5 (H) 06/12/2023   EOSABS 0.0 06/12/2023   BASOSABS 0.2 (H) 06/12/2023     Last metabolic panel Lab Results  Component Value Date   NA 133 (L) 06/12/2023   K 4.6 06/12/2023   CL 107 06/12/2023   CO2 19 (L) 06/12/2023   BUN 44 (H) 06/12/2023   CREATININE 1.38 (H) 06/12/2023   GLUCOSE 134 (H) 06/12/2023   GFRNONAA 39 (L) 06/12/2023   GFRAA 44 (L) 06/05/2019   CALCIUM  9.9 06/12/2023   PHOS 1.6 (L) 06/03/2023   PROT 4.5 (L) 06/12/2023   ALBUMIN 1.8 (L) 06/12/2023   BILITOT 0.8 06/12/2023   ALKPHOS 363 (H) 06/12/2023   AST 36 06/12/2023   ALT 24 06/12/2023   ANIONGAP 7 06/12/2023    GFR: Estimated Creatinine Clearance: 35.3 mL/min (A) (by C-G formula based on SCr of 1.38 mg/dL (H)).  Recent Results (from the past 240 hours)   Aerobic/Anaerobic Culture w Gram Stain (surgical/deep wound)     Status: None   Collection Time: 06/07/23  2:18 PM   Specimen: Abscess  Result Value Ref Range Status   Specimen Description ABSCESS  Final   Special Requests ABDOMEN  Final   Gram Stain   Final    ABUNDANT WBC PRESENT, PREDOMINANTLY PMN FEW GRAM NEGATIVE RODS FEW GRAM POSITIVE RODS    Culture   Final    MODERATE PROTEUS HAUSERI MODERATE BACTEROIDES DISTASONIS BETA LACTAMASE POSITIVE Performed at [facility], [address] [place], [place] [address]    Report Status 06/12/2023 FINAL  Final   Organism ID, Bacteria PROTEUS HAUSERI  Final      Susceptibility   Proteus hauseri - MIC*    AMPICILLIN >=32 RESISTANT Resistant     CEFEPIME  0.5 SENSITIVE Sensitive     CEFTAZIDIME 8 SENSITIVE Sensitive     CEFTRIAXONE  >=64 RESISTANT Resistant     CIPROFLOXACIN  <=0.25 SENSITIVE Sensitive     GENTAMICIN <=1 SENSITIVE Sensitive     IMIPENEM 4 SENSITIVE Sensitive     TRIMETH /SULFA  <=20 SENSITIVE Sensitive     AMPICILLIN/SULBACTAM >=32 RESISTANT Resistant     PIP/TAZO <=4 SENSITIVE Sensitive ug/mL    * MODERATE PROTEUS HAUSERI      Radiology Studies: DG CHEST PORT 1 VIEW Result Date: 06/11/2023 CLINICAL DATA:  Hypoxia EXAM: PORTABLE CHEST 1 VIEW COMPARISON:  05/29/2023 FINDINGS: Cardiac shadow is stable. Postsurgical changes are noted on the left. Right chest wall port is noted in satisfactory position. Mild bibasilar atelectasis is noted. IMPRESSION: Mild bibasilar atelectasis. Electronically Signed  By: Violeta Grey M.D.   On: 06/11/2023 21:05   CT ABDOMEN PELVIS W CONTRAST Result Date: 06/10/2023 CLINICAL DATA:  Colitis, immunotherapy related toxicity evaluate for interval change in degree of colitis, worsening leukocytosis and lack of improvement in abdominal pain EXAM: CT ABDOMEN AND PELVIS WITH CONTRAST TECHNIQUE: Multidetector CT imaging of the abdomen and pelvis was performed using the standard protocol  following bolus administration of intravenous contrast. RADIATION DOSE REDUCTION: This exam was performed according to the departmental dose-optimization program which includes automated exposure control, adjustment of the mA and/or kV according to patient size and/or use of iterative reconstruction technique. CONTRAST:  80mL OMNIPAQUE  IOHEXOL  300 MG/ML  SOLN COMPARISON:  CT 06/05/2023 FINDINGS: Lower chest: Elevated right hemidiaphragm with adjacent compressive atelectasis/scarring in the right middle and lower lobes. Dependent atelectasis in the left lower lobe. The known right lower lobe pulmonary nodule is obscured by atelectasis on the current exam, series 4, image 26. Hepatobiliary: Cirrhotic liver morphology. No focal hepatic lesion. Clips in the gallbladder fossa postcholecystectomy. No biliary dilatation. Pancreas: Fatty atrophy. No disproportionate inflammation. Edema is likely related to ascites and unchanged. Spleen: No splenomegaly.  No focal abnormality. Adrenals/Urinary Tract: No adrenal nodule. Left nephrectomy with stable appearance of the nephrectomy bed. Stable chronic scarring in the mid lower right kidney. No right hydronephrosis. No right renal inflammation. Partially distended urinary bladder. There is air in the non dependent bladder. Stomach/Bowel: Administered enteric contrast reaches the descending colon. Persistent distal descending and proximal sigmoid colonic wall thickening in the region of multiple diverticula. There is no progressive bowel inflammation from prior. Interval drainage of pericolonic abscess with a drainage catheter is adjacent to the colon. The heterogeneous collection is not definitively seen, there is currently minimally complex fluid adjacent to the drain on the current exam, measuring 4.4 x 2.7 cm, series 2, image 89. This previously measured 4.7 x 3.7 cm. The lateral aspect of this collection abuts the urinary bladder. There are no new sites of bowel inflammation  or wall thickening. No small bowel wall thickening or inflammation. Unremarkable appearance of the stomach. There are perigastric and paraesophageal varices. Vascular/Lymphatic: Aortic atherosclerosis without aneurysm. The portal vein is patent. There are perigastric and paraesophageal varices as well as left upper quadrant varices. Prominent right epicardial nodes are unchanged. No definite enlarged abdominopelvic lymph nodes. Reproductive: Status post hysterectomy. No adnexal masses. Other: Moderate amount of free air is seen in the anterior omentum, for example series 2, image 63. Patchy locules of free air present extending from the subdiaphragmatic region to the pelvis. Moderate volume abdominopelvic ascites, mildly increased from prior exam, predominantly simple fluid. Musculoskeletal: Posterior spurring at L1-L2 causes narrowing of the spinal canal. Stable sclerotic lesion in the left iliac bone. Small sclerotic lesion in the right ischium is again seen. No new osseous abnormalities over the last 5 days. IMPRESSION: 1. Interval drainage of pericolonic abscess with a drainage catheter adjacent to the colon. The heterogeneous collection is not definitively seen, there is currently a minimally complex fluid adjacent to the drain on the current exam, measuring 4.4 x 2.7 cm. This previously measured 4.7 x 3.7 cm. This may represent a residual collection, less likely loculated ascites. 2. Moderate amount of free air in the anterior abdomen and pelvis. This was not seen on prior exam. This could be related to recent procedure and drain flushing, however difficult to exclude colonic perforation in this setting. Persistent distal descending and proximal sigmoid colonic wall thickening in the region of multiple  diverticula, without progressive bowel inflammation. Continued clinical follow-up is needed. 3. Cirrhosis with sequela of portal hypertension including varices. Moderate volume abdominopelvic ascites, mildly  increased from prior exam, predominantly simple fluid. 4. Additional stable chronic findings. Aortic Atherosclerosis (ICD10-I70.0). These results were called by telephone at the time of interpretation on 06/10/2023 at 6:05 pm to the patient's nurse, no physician was available at the time of the exam interpretation to receive results. Patient's nurse will get in touch with the doctor and convey these results. Electronically Signed   By: Chadwick Colonel M.D.   On: 06/10/2023 18:11       LOS: 14 days    Aneita Keens, MD [facility] 06/12/2023, 1:33 PM   If 7PM-7AM, please contact night-coverage [email or web address]

## 2023-06-12 NOTE — Progress Notes (Signed)
 Foley inserted around 0745 this morning. Tolerated well. Port a cath needle changed. Blood return noted. Now infusing normal saline through port a cath and dextrose  5% and 0.45% NaCl infusing in right forearm. Wound care performed and teeth brushed. Patient repositioned to right side.

## 2023-06-12 NOTE — Progress Notes (Signed)
 Bladder scan at 2220 shows 412 mL, pt has no urge to urinate. On call provider notified, order received for in and out cath.700 mL of urine removed, pt tolerated the procedure well.

## 2023-06-12 NOTE — Progress Notes (Signed)
 Pharmacy Antimicrobial Stewardship Note  48 YOF with diverticulitis with abscess. E.coli bacteremia noted on 4/29 - presumed seeded from this source with no good oral options given R-Cipro  and the patient's Bactrim  allergy.   A drain was placed on 5/8 which updated today to show:  Specimen Description ABSCESS  Special Requests ABDOMEN  Gram Stain ABUNDANT WBC PRESENT, PREDOMINANTLY PMN FEW GRAM NEGATIVE RODS FEW GRAM POSITIVE RODS  Culture MODERATE PROTEUS HAUSERI MODERATE BACTEROIDES DISTASONIS BETA LACTAMASE POSITIVE Performed at [facility], [address], [place], [place] [address]  Report Status 06/12/2023 FINAL  Organism ID, Bacteria PROTEUS HAUSERI  Resulting Agency [facility]     Susceptibility   Proteus hauseri    MIC    AMPICILLIN >=32 RESIST... Resistant    AMPICILLIN/SULBACTAM >=32 RESIST... Resistant    CEFEPIME  0.5 SENSITIVE Sensitive    CEFTAZIDIME 8 SENSITIVE Sensitive    CEFTRIAXONE  >=64 RESIST... Resistant    CIPROFLOXACIN  <=0.25 SENS... Sensitive    GENTAMICIN <=1 SENSITIVE Sensitive    IMIPENEM 4 SENSITIVE Sensitive    PIP/TAZO <=4 SENSITI... Sensitive    TRIMETH /SULFA  <=20 SENSIT... Sensitive       Given that the proteus is R to Ceftriaxone  - could be an ESBL producer or ampC producer however pattern seems more consistent with ampC. Will transition the patient off Ceftriaxone  to Cefepime , continue flagyl  for now.   Plan - D/c Rocephin  - Start Cefepime  2g IV every 12 hours - Continue metronidazole  (increased to q8h by surgery)  Thank you for allowing pharmacy to be a part of this patient's care.  Garland Junk, PharmD, BCPS, BCIDP Infectious Diseases Clinical Pharmacist 06/12/2023 3:00 PM   **Pharmacist phone directory can now be found on [email or web address] ([email or web address]).  Listed under [facility].

## 2023-06-12 NOTE — Progress Notes (Signed)
 Patient scheduled to come in today for start of next cycle, however she is on day 14 of her hospitalization for c-diff colitis. She continues to decline and there is concern for perforation. She is not a surgical candidate at this time.   Will continue to follow for post discharge needs and office follow up.   Oncology Nurse Navigator Documentation     06/12/2023   10:30 AM  Oncology Nurse Navigator Flowsheets  Navigator Follow Up Date: 06/18/2023  Navigator Follow Up Reason: Appointment Review  Navigator Location [facility]  Navigator Encounter Type Appt/Treatment Plan Review  Patient Visit Type MedOnc  Treatment Phase Active Tx  Barriers/Navigation Needs Coordination of Care;Education  Interventions None Required  Acuity Level 2-Minimal Needs (1-2 Barriers Identified)  Support Groups/Services Friends and Family  Time Spent with Patient 15

## 2023-06-12 NOTE — Progress Notes (Signed)
 \        [facility]  Date of Admission:  05/29/2023   Total days of inpatient antibiotics 13  Principal Problem:   Sepsis (HCC) Active Problems:   Neutropenia (HCC)   C. difficile colitis   Abscess of sigmoid colon   Counseling and coordination of care   Enteritis due to Clostridium difficile   DNR (do not resuscitate) discussion   Goals of care, counseling/discussion   Palliative care encounter          Assessment: 78 year old female with: #C. difficile colitis - Status post Dificid  x 10 #diverticulitis complicated intra-abdominal abscess post drain placement #E. coli bacteremia #Neutropenic fever - Initial plan was to treat with ceftriaxone  x 2 weeks then transition to Augmentin  for about 4 more weeks. - Repeat CT showed hydrogenous collection of minimally complex fluid adjacent to the drain measuring 4.4X 2.770 m previously measured 4.7X 3.7 cm.  Moderate amount of free air in anterior abdomen pelvis. - Per surgery, no plans for 2nd drain at this time. Surgery signed off.  -Cx form 5/8 + proteus hauseri and bacteroides distasonis Recommendations: -Continue  vancomycin  125mg  po bid for cdiff secondary ppx -D/C ceftriaxone  -Start cefepime  -Continue metronidazole  --Drain study to look for drain repositioning planned,    Evaluation of this patient requires complex antimicrobial therapy evaluation and counseling + isolation needs for disease transmission risk assessment and mitigation     Microbiology:   Antibiotics: Cefadroxil - 5/1 - 5/10 Ceftriaxone /20-present Metronidazole /29-present Cultures: Blood /29 2/2 E. coli 5/1 no growth  Urine  Other  5/8 moderate GNR SUBJECTIVE: Sitting in bed.  Family at bedside. Interval: Afebrile overnight.  WBC 28.6k  Review of Systems: Review of Systems  All other systems reviewed and are negative.    Scheduled Meds:  Chlorhexidine  Gluconate Cloth  6 each Topical Daily   Gerhardt's  butt cream   Topical TID   insulin  aspart  0-15 Units Subcutaneous TID WC   insulin  aspart  0-5 Units Subcutaneous QHS   insulin  glargine-yfgn  14 Units Subcutaneous QHS   sodium chloride  flush  10-40 mL Intracatheter Q12H   sodium chloride  flush  5 mL Intracatheter Q8H   vancomycin   125 mg Oral BID   Continuous Infusions:  ceFEPime  (MAXIPIME ) IV Stopped (06/12/23 1157)   dextrose  5 % and 0.45 % NaCl 75 mL/hr at 06/12/23 0825   metronidazole  500 mg (06/12/23 1349)   PRN Meds:.albuterol , morphine  injection, [DISCONTINUED] ondansetron  **OR** ondansetron  (ZOFRAN ) IV, mouth rinse, sodium chloride  flush Allergies  Allergen Reactions   Culturelle Probiotics [Lactobacillus]     Patient should not take probiotics while immunocompromised from chemo    Bactrim  Ds [Sulfamethoxazole -Trimethoprim ] Rash    OBJECTIVE: Vitals:   06/11/23 2344 06/12/23 0334 06/12/23 0812 06/12/23 1407  BP: (!) 120/49 118/62 (!) 117/52 (!) 119/54  Pulse: (!) 122 (!) 120 (!) 117 (!) 110  Resp: 19 20 16 18   Temp: (!) 97.5 F (36.4 C) (!) 97.5 F (36.4 C)  (!) 97.4 F (36.3 C)  TempSrc: Oral Oral  Oral  SpO2: 95% 95% 95% 96%  Weight:      Height:       Body mass index is 33.9 kg/m.  Physical Exam Constitutional:      Appearance: Normal appearance.  HENT:     Head: Normocephalic and atraumatic.     Right Ear: Tympanic membrane normal.     Left Ear: Tympanic membrane normal.     Nose: Nose normal.  Mouth/Throat:     Mouth: Mucous membranes are moist.  Eyes:     Extraocular Movements: Extraocular movements intact.     Conjunctiva/sclera: Conjunctivae normal.     Pupils: Pupils are equal, round, and reactive to light.  Cardiovascular:     Rate and Rhythm: Normal rate and regular rhythm.     Heart sounds: No murmur heard.    No friction rub. No gallop.  Pulmonary:     Effort: Pulmonary effort is normal.     Breath sounds: Normal breath sounds.  Abdominal:     Comments: drain  Musculoskeletal:         General: Normal range of motion.  Skin:    General: Skin is warm and dry.  Neurological:     General: No focal deficit present.     Mental Status: She is alert and oriented to person, place, and time.  Psychiatric:        Mood and Affect: Mood normal.       Lab Results Lab Results  Component Value Date   WBC 28.6 (H) 06/12/2023   HGB 9.7 (L) 06/12/2023   HCT 30.0 (L) 06/12/2023   MCV 104.9 (H) 06/12/2023   PLT 122 (L) 06/12/2023    Lab Results  Component Value Date   CREATININE 1.38 (H) 06/12/2023   BUN 44 (H) 06/12/2023   NA 133 (L) 06/12/2023   K 4.6 06/12/2023   CL 107 06/12/2023   CO2 19 (L) 06/12/2023    Lab Results  Component Value Date   ALT 24 06/12/2023   AST 36 06/12/2023   ALKPHOS 363 (H) 06/12/2023   BILITOT 0.8 06/12/2023        Orlie Bjornstad, MD [facility] 06/12/2023, 6:47 PM

## 2023-06-12 NOTE — Progress Notes (Signed)
 Daily Progress Note   Patient Name: Amanda Crawford       Date: 06/12/2023 DOB: 15-Jul-1945  Age: 78 y.o. MRN#: [id number] Attending Physician: Verlyn Goad, MD Primary Care Physician: Neda Balk, MD Admit Date: 05/29/2023 Length of Stay: 14 days  Reason for Consultation/Follow-up: Establishing goals of care  Subjective:   CC: Patient resting in bed, spouse and daughter at bedside, they have had extensive discussions with surgery earlier this am.    Following up regarding complex medical decision making.  Subjective:  Reviewed EMR prior to presenting to bedside.  Reviewed recent oncology and surgery team notes.  Surgery team has re evaluated her today and they have also discussed with patient and her family - surgical risk remains prohibitive, IR to re evaluate regarding re positioning of drain, avoid second drain placement.     Presented to bedside to meet with patient.  Daughter and patient's husband also present in the room.  I introduced myself as a member of the palliative medicine team and they recalled meeting my colleague Dr Azalea Lento earlier.  We reviewed hospital course thus far, we talked about code status and patient's previously prepared living wills.   Able to review patient's discussion with surgery team and results of CT scan.  Again agreed with surgical team that surgical interventions would likely cause worsening of patient's quality of life and could even hasten patient's death.  Discussed currently continuing with medical management including drain placement to determine if patient will have improvement of condition.  Spent time answering questions as able from patient and family.   Extensively reviewed CODE STATUS and a detailed discussion with patient and family regarding full code versus dnr dni was done. We reviewed DNR DNI status on previously prepared living wills, explained about it being medically recommended to consider DNR DNI, given her current situation.  Explained that CPR, shocks if indicated, intubation and mechanical ventilation at end of life would cause more harm and discomfort in my opinion - we compared and contrasted that to DNR DNI, explained that DNR DNI does not mean "do not treat." Recommended all reasonable medical interventions to continue so that the patient may have a chance to get as good as she can for as long as she can.  Code status to be revised to DNR DNI with limited interventions.    Spent time answering questions as able.  Noted palliative medicine team will continue to follow along with patient's medical journey.  Review of Systems Abdominal discomfort due to diarrhea Objective:   Vital Signs:  BP (!) 117/52 (BP Location: Left Arm)   Pulse (!) 117   Temp (!) 97.5 F (36.4 C) (Oral)   Resp 16   Ht 5\' 4"  (1.626 m)   Wt 89.6 kg   SpO2 95%   BMI 33.90 kg/m   Physical Exam: General: NAD, alert, elderly, chronically ill-appearing Cardiovascular: RRR Respiratory: no increased work of breathing noted, not in respiratory distress Abdomen: distended Neuro: A&Ox4, following commands easily Psych: appropriately answers all questions  Imaging:  I personally reviewed recent imaging.   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 difficile who was admitted on 05/29/2023 secondary to weakness and confusion. During hospitalization patient was found to have NSTEMI and being neutropenic. Patient found to have evidence of sepsis  secondary to sigmoid diverticulitis with abscess and recurrent difficile colitis with E. coli bacteremia. General surgery, ID, and IR consulted during hospitalization for recommendations. Palliative medicine team consulted to assist with complex medical decision making.    Recommendations/Plan: # Complex medical decision making/goals of care:  -    Again noted importance of continued discussions based on patient's response to medical interventions.  Noted palliative medicine team continue to follow along and engage in conversations as able and appropriate to assist with supporting next steps.  -  Code Status: Limited: Do not attempt resuscitation (DNR) -DNR-LIMITED -Do Not Intubate/DNI      reviewed CODE STATUS with the patient today while family present as noted above.     # Psychosocial Support:  - Husband, son, daughter  # Discharge Planning: To Be Determined  Discussed with: Patient, patient's family at bedside, RN, reached out to IR colleague via secure chat at patient and family's request.   Thank you for allowing the palliative care team to participate in the care Amanda Crawford.  Mod MDM Lujean Sake MD.  Palliative Care Provider PMT # [phone]  If patient remains symptomatic despite maximum doses, please call PMT at [phone] between 0700 and 1900. Outside of these hours, please call attending, as PMT does not have night coverage.

## 2023-06-12 NOTE — Progress Notes (Signed)
 Physical Therapy Treatment Patient Details Name: Amanda Crawford MRN: [id number] DOB: [birth year] Today's Date: 06/12/2023   History of Present Illness Amanda Crawford is a 78 y.o. female admitted with sepsis; 4/30 C. difficile PCR and antigen positive. PMH significant for small cell lung cancer, prior renal cell cancer with lung mets, DM2, CKD, recently hospitalized in March for septic shock diverticulitis and C. difficile infection.    PT Comments  Patient resting in bed with family at bedside, agreeable to mobilize with therapy. Patient required min assist for bed mobility and initial sit<>stand from EOB and steps to recliner. Slight Rt lean during turn and cues to center weight shift. Pt completed repeated sit<>stands from recliner with CGA, improved rise on each rep. EOS reposition in recliner for comfort and lunch set up. Will continue to progress as able.     If plan is discharge home, recommend the following: A little help with walking and/or transfers;A little help with bathing/dressing/bathroom;Assistance with cooking/housework;Assist for transportation;Help with stairs or ramp for entrance   Can travel by private vehicle        Equipment Recommendations  None recommended by PT    Recommendations for Other Services       Precautions / Restrictions Precautions Precautions: Fall Recall of Precautions/Restrictions: Intact Precaution/Restrictions Comments: drain Restrictions Weight Bearing Restrictions Per Provider Order: No     Mobility  Bed Mobility Overal bed mobility: Needs Assistance Bed Mobility: Rolling, Supine to Sit, Sit to Supine Rolling: Contact guard assist   Supine to sit: Min assist, HOB elevated, Used rails Sit to supine: Min assist        Transfers Overall transfer level: Needs assistance Equipment used: Rolling walker (2 wheels) Transfers: Sit to/from Stand, Bed to chair/wheelchair/BSC Sit to Stand: Min assist, Contact guard assist   Step pivot  transfers: Min assist            Ambulation/Gait                   Stairs             Wheelchair Mobility     Tilt Bed    Modified Rankin (Stroke Patients Only)       Balance Overall balance assessment: Needs assistance Sitting-balance support: Feet supported Sitting balance-Leahy Scale: Fair     Standing balance support: Bilateral upper extremity supported, During functional activity, Reliant on assistive device for balance Standing balance-Leahy Scale: Poor Standing balance comment: B UE support on RW with cues                            Communication Communication Communication: No apparent difficulties  Cognition Arousal: Alert Behavior During Therapy: WFL for tasks assessed/performed   PT - Cognitive impairments: No apparent impairments                         Following commands: Intact      Cueing Cueing Techniques: Verbal cues  Exercises      General Comments        Pertinent Vitals/Pain Pain Assessment Pain Assessment: Faces Faces Pain Scale: Hurts a little bit Pain Location: butt Pain Descriptors / Indicators: Discomfort, Grimacing Pain Intervention(s): Limited activity within patient's tolerance, Monitored during session, Repositioned    Home Living                          Prior Function  PT Goals (current goals can now be found in the care plan section) Acute Rehab PT Goals Patient Stated Goal: I'm hurting" PT Goal Formulation: With patient/family Time For Goal Achievement: 06/14/23 Potential to Achieve Goals: Good Progress towards PT goals: Progressing toward goals    Frequency    Min 3X/week      PT Plan      Co-evaluation              AM-PAC PT "6 Clicks" Mobility   Outcome Measure  Help needed turning from your back to your side while in a flat bed without using bedrails?: A Little Help needed moving from lying on your back to sitting on the side of  a flat bed without using bedrails?: A Little Help needed moving to and from a bed to a chair (including a wheelchair)?: A Little Help needed standing up from a chair using your arms (e.g., wheelchair or bedside chair)?: A Little Help needed to walk in hospital room?: A Little Help needed climbing 3-5 steps with a railing? : A Lot 6 Click Score: 17    End of Session Equipment Utilized During Treatment: Gait belt Activity Tolerance: Patient tolerated treatment well;Patient limited by fatigue Patient left: with family/visitor present;with call bell/phone within reach;in chair Nurse Communication: Mobility status PT Visit Diagnosis: Muscle weakness (generalized) (M62.81);Difficulty in walking, not elsewhere classified (R26.2)     Time: 1425-1500 PT Time Calculation (min) (ACUTE ONLY): 35 min  Charges:    $Therapeutic Activity: 23-37 mins PT General Charges $$ ACUTE PT VISIT: 1 Visit                     Tish Forge, DPT Acute Rehabilitation Services Office [phone]  06/12/23 3:16 PM

## 2023-06-13 ENCOUNTER — Ambulatory Visit

## 2023-06-13 DIAGNOSIS — A0472 Enterocolitis due to Clostridium difficile, not specified as recurrent: Secondary | ICD-10-CM | POA: Diagnosis not present

## 2023-06-13 DIAGNOSIS — C342 Malignant neoplasm of middle lobe, bronchus or lung: Secondary | ICD-10-CM | POA: Diagnosis not present

## 2023-06-13 DIAGNOSIS — A4151 Sepsis due to Escherichia coli [E. coli]: Secondary | ICD-10-CM | POA: Diagnosis not present

## 2023-06-13 LAB — CBC WITH DIFFERENTIAL/PLATELET
Abs Immature Granulocytes: 0.72 10*3/uL — ABNORMAL HIGH (ref 0.00–0.07)
Basophils Absolute: 0.1 10*3/uL (ref 0.0–0.1)
Basophils Relative: 1 %
Eosinophils Absolute: 0 10*3/uL (ref 0.0–0.5)
Eosinophils Relative: 0 %
HCT: 32.7 % — ABNORMAL LOW (ref 36.0–46.0)
Hemoglobin: 10.3 g/dL — ABNORMAL LOW (ref 12.0–15.0)
Immature Granulocytes: 3 %
Lymphocytes Relative: 7 %
Lymphs Abs: 1.5 10*3/uL (ref 0.7–4.0)
MCH: 33 pg (ref 26.0–34.0)
MCHC: 31.5 g/dL (ref 30.0–36.0)
MCV: 104.8 fL — ABNORMAL HIGH (ref 80.0–100.0)
Monocytes Absolute: 1.3 10*3/uL — ABNORMAL HIGH (ref 0.1–1.0)
Monocytes Relative: 6 %
Neutro Abs: 19.4 10*3/uL — ABNORMAL HIGH (ref 1.7–7.7)
Neutrophils Relative %: 83 %
Platelets: 102 10*3/uL — ABNORMAL LOW (ref 150–400)
RBC: 3.12 MIL/uL — ABNORMAL LOW (ref 3.87–5.11)
RDW: 20.8 % — ABNORMAL HIGH (ref 11.5–15.5)
WBC: 23 10*3/uL — ABNORMAL HIGH (ref 4.0–10.5)
nRBC: 0.1 % (ref 0.0–0.2)

## 2023-06-13 LAB — COMPREHENSIVE METABOLIC PANEL WITH GFR
ALT: 22 U/L (ref 0–44)
AST: 30 U/L (ref 15–41)
Albumin: <1.5 g/dL — ABNORMAL LOW (ref 3.5–5.0)
Alkaline Phosphatase: 331 U/L — ABNORMAL HIGH (ref 38–126)
Anion gap: 7 (ref 5–15)
BUN: 47 mg/dL — ABNORMAL HIGH (ref 8–23)
CO2: 13 mmol/L — ABNORMAL LOW (ref 22–32)
Calcium: 8.1 mg/dL — ABNORMAL LOW (ref 8.9–10.3)
Chloride: 115 mmol/L — ABNORMAL HIGH (ref 98–111)
Creatinine, Ser: 1.36 mg/dL — ABNORMAL HIGH (ref 0.44–1.00)
GFR, Estimated: 39 mL/min — ABNORMAL LOW (ref >60)
Glucose, Bld: 137 mg/dL — ABNORMAL HIGH (ref 70–99)
Potassium: 3.6 mmol/L (ref 3.5–5.1)
Sodium: 135 mmol/L (ref 135–145)
Total Bilirubin: 0.7 mg/dL (ref 0.0–1.2)
Total Protein: 3.6 g/dL — ABNORMAL LOW (ref 6.5–8.1)

## 2023-06-13 LAB — GLUCOSE, CAPILLARY
Glucose-Capillary: 146 mg/dL — ABNORMAL HIGH (ref 70–99)
Glucose-Capillary: 103 mg/dL — ABNORMAL HIGH (ref 70–99)
Glucose-Capillary: 111 mg/dL — ABNORMAL HIGH (ref 70–99)
Glucose-Capillary: 104 mg/dL — ABNORMAL HIGH (ref 70–99)

## 2023-06-13 MED ORDER — ALBUMIN HUMAN 25 % IV SOLN
50.0000 g | Freq: Once | INTRAVENOUS | Status: AC
  Administered 2023-06-14: 50 g via INTRAVENOUS
  Filled 2023-06-13: qty 200

## 2023-06-13 MED ORDER — ZINC OXIDE 12.8 % EX OINT
TOPICAL_OINTMENT | Freq: Two times a day (BID) | CUTANEOUS | Status: DC
  Administered 2023-06-13: 1 via TOPICAL
  Filled 2023-06-13: qty 56.7

## 2023-06-13 MED ORDER — ALBUMIN HUMAN 25 % IV SOLN
50.0000 g | Freq: Once | INTRAVENOUS | Status: DC
  Filled 2023-06-13: qty 200

## 2023-06-13 MED ORDER — SODIUM BICARBONATE 650 MG PO TABS
1300.0000 mg | ORAL_TABLET | Freq: Three times a day (TID) | ORAL | Status: DC
  Administered 2023-06-13 – 2023-06-15 (×6): 1300 mg via ORAL
  Filled 2023-06-13 (×9): qty 2

## 2023-06-13 NOTE — Progress Notes (Signed)
 Occupational Therapy Treatment Patient Details Name: Amanda Crawford MRN: [id number] DOB: Nov 14, 1945 Today's Date: 06/13/2023   History of present illness Amanda Crawford is a 78 y.o. female admitted with sepsis; 4/30 C. difficile PCR and antigen positive. PMH significant for small cell lung cancer, prior renal cell cancer with lung mets, DM2, CKD, recently hospitalized in March for septic shock diverticulitis and C. difficile infection   OT comments  Patient seen for skilled OT session this afternoon. To note, patient now on 4 ltrs of O2 via [place], presented with increased fatigue with decreased activity tolerance, some abdominal pain and bowel incontinence with transfer attempt while EOB. See below for functional status. Family in room and based on current decline in functional activity tolerance and level of care needs that they are unable to provide, OT now recommending continued inpatient follow up therapy, <3 hours/day.Educated on positioning for skin protection and increased comfort.  Acute level OT will continue to follow to make gains as able.        If plan is discharge home, recommend the following:  Two people to help with walking and/or transfers;Two people to help with bathing/dressing/bathroom;Assistance with cooking/housework;Assistance with feeding;Direct supervision/assist for medications management;Direct supervision/assist for financial management;Assist for transportation;Help with stairs or ramp for entrance   Equipment Recommendations  None recommended by OT       Precautions / Restrictions Precautions Precautions: Fall Recall of Precautions/Restrictions: Intact Precaution/Restrictions Comments: drain Restrictions Weight Bearing Restrictions Per Provider Order: No       Mobility Bed Mobility Overal bed mobility: Needs Assistance Bed Mobility: Rolling, Supine to Sit, Sit to Supine Rolling: Mod assist   Supine to sit: Max assist, HOB elevated, Used rails Sit to  supine: Max assist, HOB elevated, Used rails   General bed mobility comments: cues for use of bed rail/sequencing. min assist to raise trunk and pivot to EOB with bed pad.    Transfers    BM incontinence with attempt to transfer                          ADL either performed or assessed with clinical judgement   ADL Overall ADL's : Needs assistance/impaired                             Toileting- Clothing Manipulation and Hygiene: Total assistance;+2 for physical assistance;+2 for safety/equipment;Bed level (upon mobilization to EOB patient incontinenct of bowel with total care needed for hygiene and application of barrier cream to skin due to several reddeded and raw regions, patient now on air mattress and positioned in sidelying with pillow props)         General ADL Comments: now on 4 ltrs of O2 via [place] with decline in overall activity tolerance since last visit    Extremity/Trunk Assessment Upper Extremity Assessment Upper Extremity Assessment: Generalized weakness   Lower Extremity Assessment Lower Extremity Assessment: Generalized weakness                 Communication Communication Communication: No apparent difficulties   Cognition Arousal: Alert Behavior During Therapy: WFL for tasks assessed/performed Cognition: No apparent impairments                               Following commands: Intact        Cueing   Cueing Techniques: Verbal cues  Exercises General  Exercises - Upper Extremity Shoulder Flexion: Other (comment) (B UE light balloon target toss 3 sets of 20 with rest in between up in chair position o bed prior to PT arrival)       General Comments see above as patient now on 4 ltrs O2, decline in activity tolerance and skin issues now on buttocks with air mattress now in place    Pertinent Vitals/ Pain       Pain Assessment Pain Assessment: Faces Faces Pain Scale: Hurts little more Breathing: occasional labored  breathing, short period of hyperventilation Negative Vocalization: occasional moan/groan, low speech, negative/disapproving quality Facial Expression: facial grimacing Body Language: tense, distressed pacing, fidgeting Consolability: distracted or reassured by voice/touch PAINAD Score: 6 Pain Location: buttocks and abdominal region Pain Descriptors / Indicators: Discomfort, Grimacing Pain Intervention(s): Limited activity within patient's tolerance, Repositioned, Monitored during session, Relaxation   Frequency  Min 2X/week        Progress Toward Goals  OT Goals(current goals can now be found in the care plan section)  Progress towards OT goals: Not progressing toward goals - comment  Acute Rehab OT Goals Patient Stated Goal: to get more assistance now OT Goal Formulation: With family Time For Goal Achievement: 06/16/23 Potential to Achieve Goals: Fair ADL Goals Pt Will Perform Lower Body Bathing: with contact guard assist;sitting/lateral leans;sit to/from stand;with adaptive equipment Pt Will Perform Lower Body Dressing: with min assist;sitting/lateral leans;sit to/from stand;with adaptive equipment Pt Will Transfer to Toilet: bedside commode;ambulating;grab bars;with contact guard assist Pt/caregiver will Perform Home Exercise Program: Increased strength;With written HEP provided;With Supervision Additional ADL Goal #1: Patient will teach back 4/4 ECTs for ADL's and mobility with min cues  Plan      Co-evaluation    PT/OT/SLP Co-Evaluation/Treatment: Yes Reason for Co-Treatment: Complexity of the patient's impairments (multi-system involvement);For patient/therapist safety;To address functional/ADL transfers PT goals addressed during session: Mobility/safety with mobility OT goals addressed during session: ADL's and self-care;Strengthening/ROM      AM-PAC OT "6 Clicks" Daily Activity     Outcome Measure   Help from another person eating meals?: A Little Help from  another person taking care of personal grooming?: A Little Help from another person toileting, which includes using toliet, bedpan, or urinal?: Total Help from another person bathing (including washing, rinsing, drying)?: A Lot Help from another person to put on and taking off regular upper body clothing?: A Lot Help from another person to put on and taking off regular lower body clothing?: Total 6 Click Score: 12    End of Session Equipment Utilized During Treatment: Oxygen  OT Visit Diagnosis: Unsteadiness on feet (R26.81);Muscle weakness (generalized) (M62.81)   Activity Tolerance Patient limited by fatigue;Patient limited by pain   Patient Left in bed;with bed alarm set;with family/visitor present   Nurse Communication Mobility status;Other (comment) (BM added to flowsheets)        Time: 1610-9604 OT Time Calculation (min): 35 min  Charges: OT General Charges $OT Visit: 1 Visit OT Treatments $Therapeutic Activity: 8-22 mins  Breyonna Nault OT/L Acute Rehabilitation Department  [phone]  06/13/2023, 4:09 PM

## 2023-06-13 NOTE — Progress Notes (Signed)
  Daily Progress Note   Patient Name: Amanda Crawford       Date: 06/13/2023 DOB: 1945-10-17  Age: 78 y.o. MRN#: [id number] Attending Physician: Hoyt Macleod, MD Primary Care Physician: Neda Balk, MD Admit Date: 05/29/2023 Length of Stay: 15 days  Reason for Consultation/Follow-up: Establishing goals of care  Subjective:   CC: Patient resting in bed, spouse and son at bedside    Following up regarding complex medical decision making.  Subjective:  Reviewed EMR prior to presenting to bedside.  Reviewed recent oncology and surgery team notes.     Continue to monitor hospital course.   Review of Systems Abdominal discomfort due to diarrhea Objective:   Vital Signs:  BP (!) 113/55 (BP Location: Left Arm)   Pulse (!) 110   Temp (!) 97.4 F (36.3 C) (Oral)   Resp 20   Ht 5\' 4"  (1.626 m)   Wt 89.6 kg   SpO2 97%   BMI 33.90 kg/m   Physical Exam: General: NAD, alert, elderly, chronically ill-appearing Cardiovascular: RRR Respiratory: no increased work of breathing noted, not in respiratory distress Abdomen: distended Neuro: A&Ox4, following commands easily Psych: appropriately answers all questions  Imaging:  I personally reviewed recent imaging.   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 difficile who was admitted on 05/29/2023 secondary to weakness and confusion. During hospitalization patient was found to have NSTEMI and being neutropenic. Patient found to have evidence of sepsis secondary to sigmoid diverticulitis with abscess and recurrent difficile colitis with E. coli bacteremia. General surgery, ID, and IR consulted during hospitalization for recommendations. Palliative medicine team consulted to assist with complex medical decision making.    Recommendations/Plan: # Complex medical decision making/goals of care:  -      Noted palliative medicine team continue to follow along and engage in conversations as able and appropriate to assist with supporting next steps.  -  Code Status: Limited: Do not attempt resuscitation (DNR) -DNR-LIMITED -Do Not Intubate/DNI      DNR DNI   # Psychosocial Support:  - Husband, son, daughter  # Discharge Planning: To Be Determined  Discussed with: Patient, patient's family at bedside.   Thank you for allowing the palliative care team to participate in the care [name]. Low MDM Lujean Sake MD.  Palliative Care Provider PMT # [phone]  If patient remains symptomatic despite maximum doses, please call PMT at [phone] between 0700 and 1900. Outside of these hours, please call attending, as PMT does not have night coverage.

## 2023-06-13 NOTE — Progress Notes (Signed)
 Patient ID: Amanda Crawford, female   DOB: 10/26/1945, 78 y.o.   MRN: [id number]    Referring Physician(s): Burton,V  Supervising Physician: Art Largo  Patient Status:  [facility] - In-pt  Chief Complaint: Abdominal-pelvic pain/ diverticular abscess, diarrhea, lung cancer; s/p pelvic drain placement 5/8    Subjective: Pt without new c/o; family in room; attempting to eat lunch   Allergies: Culturelle probiotics [lactobacillus] and Bactrim  ds [sulfamethoxazole -trimethoprim ]  Medications: Prior to Admission medications   Medication Sig Start Date End Date Taking? Authorizing Provider  allopurinol  (ZYLOPRIM ) 300 MG tablet TAKE 1 TABLET BY MOUTH EVERY DAY 03/15/23  Yes Neda Balk, MD  aspirin  EC 81 MG tablet Take 81 mg by mouth daily.   Yes [provider]  atorvastatin  (LIPITOR) 20 MG tablet TAKE 1 TABLET BY MOUTH EVERY DAY 05/21/23  Yes Neda Balk, MD  carvedilol  (COREG ) 3.125 MG tablet Take 0.5 tablets (1.5625 mg total) by mouth 2 (two) times daily with a meal. 02/20/23  Yes Byrum, Delora Ferry, MD  dexamethasone  (DECADRON ) 4 MG tablet Take 2 tablets daily for 2 days, on days 4 and 5.Take with food. Every 21 days. 03/14/23  Yes Ivor Mars, MD  diphenhydramine -acetaminophen  (TYLENOL  PM) 25-500 MG TABS Take 1 tablet by mouth at bedtime as needed (sleep).   Yes [provider]  ferrous sulfate  325 (65 FE) MG tablet Take 325 mg by mouth at bedtime.   Yes [provider]  fluconazole  (DIFLUCAN ) 100 MG tablet Take 1 tablet (100 mg total) by mouth daily. 05/08/23  Yes Ennever, Sherryll Donald, MD  glimepiride  (AMARYL ) 2 MG tablet TAKE 1 TABLET BY MOUTH EVERY DAY WITH BREAKFAST 05/21/23  Yes Neda Balk, MD  hyoscyamine  (LEVSIN SL) 0.125 MG SL tablet Place 1 tablet (0.125 mg total) under the tongue every 6 (six) hours as needed. 04/16/23  Yes Neda Balk, MD  ondansetron  (ZOFRAN ) 8 MG tablet Take 1 tablet (8 mg total) by mouth every 8 (eight) hours as needed for  nausea or vomiting. Start on third day after chemotherapy. 03/14/23  Yes Ennever, Sherryll Donald, MD  SEMGLEE , YFGN, 100 UNIT/ML Pen Inject 14 Units into the skin at bedtime. 12/27/22  Yes [provider]  Vitamin D , Ergocalciferol , (DRISDOL ) 1.25 MG (50000 UNIT) CAPS capsule TAKE 1 CAPSULE (50,000 UNITS TOTAL) BY MOUTH EVERY 7 (SEVEN) DAYS. 04/05/23  Yes Webb, Padonda B, FNP  zolpidem  (AMBIEN ) 5 MG tablet Take 1 tablet (5 mg total) by mouth at bedtime. 04/24/23  Yes Olalere, Adewale A, MD  glucose blood (ONETOUCH VERIO) test strip CHECK BLOOD SUGAR DAILY AS NEEDED 05/22/23   Neda Balk, MD  insulin  glargine (LANTUS ) 100 unit/mL SOPN Inject 14 Units into the skin at bedtime. 04/26/23: Reports during TOC 30-day program call: she is currently taking this insulin  14 U at bedtime: reports has not started Semglee  insulin  because she still had Lantus  at home to use- per PCP advice Patient not taking: Reported on 05/29/2023    Neda Balk, MD  Insulin  Pen Needle (BD PEN NEEDLE NANO 2ND GEN) 32G X 4 MM MISC USE AS DIRECTED WITH LANTUS  ONCE A DAY. 11/20/22   Neda Balk, MD  Lancets [facility] DELICA PLUS LANCET33G) MISC USE AS DIRECTED TO TEST DAILY 09/11/22   Neda Balk, MD  vancomycin  (VANCOCIN ) 125 MG capsule Take 125 mg by mouth 4 (four) times daily. Patient not taking: Reported on 05/28/2023 04/18/23   [provider]  glyBURIDE -metformin  (GLUCOVANCE ) 1.25-250  MG tablet Take 1 tablet by mouth 2 (two) times daily with a meal. 03/25/19 03/25/19  Neda Balk, MD     Vital Signs: BP (!) 113/55 (BP Location: Left Arm)   Pulse (!) 110   Temp (!) 97.4 F (36.3 C) (Oral)   Resp 20   Ht 5\' 4"  (1.626 m)   Wt 197 lb 8 oz (89.6 kg)   SpO2 97%   BMI 33.90 kg/m   Physical Exam: awake/alert; left lower abd/pelvic drain intact; dressing clean and dry, site not sig tender, OP 2300 cc likely combination ascites/ feculent appearing fluid; drain flushed without difficulty  Imaging: DG  CHEST PORT 1 VIEW Result Date: 06/11/2023 CLINICAL DATA:  Hypoxia EXAM: PORTABLE CHEST 1 VIEW COMPARISON:  05/29/2023 FINDINGS: Cardiac shadow is stable. Postsurgical changes are noted on the left. Right chest wall port is noted in satisfactory position. Mild bibasilar atelectasis is noted. IMPRESSION: Mild bibasilar atelectasis. Electronically Signed   By: Violeta Grey M.D.   On: 06/11/2023 21:05   CT ABDOMEN PELVIS W CONTRAST Result Date: 06/10/2023 CLINICAL DATA:  Colitis, immunotherapy related toxicity evaluate for interval change in degree of colitis, worsening leukocytosis and lack of improvement in abdominal pain EXAM: CT ABDOMEN AND PELVIS WITH CONTRAST TECHNIQUE: Multidetector CT imaging of the abdomen and pelvis was performed using the standard protocol following bolus administration of intravenous contrast. RADIATION DOSE REDUCTION: This exam was performed according to the departmental dose-optimization program which includes automated exposure control, adjustment of the mA and/or kV according to patient size and/or use of iterative reconstruction technique. CONTRAST:  80mL OMNIPAQUE  IOHEXOL  300 MG/ML  SOLN COMPARISON:  CT 06/05/2023 FINDINGS: Lower chest: Elevated right hemidiaphragm with adjacent compressive atelectasis/scarring in the right middle and lower lobes. Dependent atelectasis in the left lower lobe. The known right lower lobe pulmonary nodule is obscured by atelectasis on the current exam, series 4, image 26. Hepatobiliary: Cirrhotic liver morphology. No focal hepatic lesion. Clips in the gallbladder fossa postcholecystectomy. No biliary dilatation. Pancreas: Fatty atrophy. No disproportionate inflammation. Edema is likely related to ascites and unchanged. Spleen: No splenomegaly.  No focal abnormality. Adrenals/Urinary Tract: No adrenal nodule. Left nephrectomy with stable appearance of the nephrectomy bed. Stable chronic scarring in the mid lower right kidney. No right hydronephrosis.  No right renal inflammation. Partially distended urinary bladder. There is air in the non dependent bladder. Stomach/Bowel: Administered enteric contrast reaches the descending colon. Persistent distal descending and proximal sigmoid colonic wall thickening in the region of multiple diverticula. There is no progressive bowel inflammation from prior. Interval drainage of pericolonic abscess with a drainage catheter is adjacent to the colon. The heterogeneous collection is not definitively seen, there is currently minimally complex fluid adjacent to the drain on the current exam, measuring 4.4 x 2.7 cm, series 2, image 89. This previously measured 4.7 x 3.7 cm. The lateral aspect of this collection abuts the urinary bladder. There are no new sites of bowel inflammation or wall thickening. No small bowel wall thickening or inflammation. Unremarkable appearance of the stomach. There are perigastric and paraesophageal varices. Vascular/Lymphatic: Aortic atherosclerosis without aneurysm. The portal vein is patent. There are perigastric and paraesophageal varices as well as left upper quadrant varices. Prominent right epicardial nodes are unchanged. No definite enlarged abdominopelvic lymph nodes. Reproductive: Status post hysterectomy. No adnexal masses. Other: Moderate amount of free air is seen in the anterior omentum, for example series 2, image 63. Patchy locules of free air present extending from the subdiaphragmatic region  to the pelvis. Moderate volume abdominopelvic ascites, mildly increased from prior exam, predominantly simple fluid. Musculoskeletal: Posterior spurring at L1-L2 causes narrowing of the spinal canal. Stable sclerotic lesion in the left iliac bone. Small sclerotic lesion in the right ischium is again seen. No new osseous abnormalities over the last 5 days. IMPRESSION: 1. Interval drainage of pericolonic abscess with a drainage catheter adjacent to the colon. The heterogeneous collection is not  definitively seen, there is currently a minimally complex fluid adjacent to the drain on the current exam, measuring 4.4 x 2.7 cm. This previously measured 4.7 x 3.7 cm. This may represent a residual collection, less likely loculated ascites. 2. Moderate amount of free air in the anterior abdomen and pelvis. This was not seen on prior exam. This could be related to recent procedure and drain flushing, however difficult to exclude colonic perforation in this setting. Persistent distal descending and proximal sigmoid colonic wall thickening in the region of multiple diverticula, without progressive bowel inflammation. Continued clinical follow-up is needed. 3. Cirrhosis with sequela of portal hypertension including varices. Moderate volume abdominopelvic ascites, mildly increased from prior exam, predominantly simple fluid. 4. Additional stable chronic findings. Aortic Atherosclerosis (ICD10-I70.0). These results were called by telephone at the time of interpretation on 06/10/2023 at 6:05 pm to the patient's nurse, no physician was available at the time of the exam interpretation to receive results. Patient's nurse will get in touch with the doctor and convey these results. Electronically Signed   By: Chadwick Colonel M.D.   On: 06/10/2023 18:11    Labs:  CBC: Recent Labs    06/09/23 0525 06/10/23 0440 06/11/23 1127 06/12/23 0525  WBC 19.0* 33.3* 29.8* 28.6*  HGB 9.8* 10.2* 9.6* 9.7*  HCT 31.2* 32.3* 30.4* 30.0*  PLT 99* 136* 137* 122*    COAGS: Recent Labs    06/06/23 1000  INR 1.3*    BMP: Recent Labs    06/10/23 1403 06/11/23 1127 06/12/23 0525 06/13/23 0500  NA 131* 135 133* 135  K 4.1 4.5 4.6 3.6  CL 105 108 107 115*  CO2 19* 19* 19* 13*  GLUCOSE 125* 108* 134* 137*  BUN 29* 38* 44* 47*  CALCIUM  9.7 9.8 9.9 8.1*  CREATININE 1.41* 1.31* 1.38* 1.36*  GFRNONAA 38* 41* 39* 39*    LIVER FUNCTION TESTS: Recent Labs    06/09/23 0525 06/11/23 1127 06/12/23 0525  06/13/23 0500  BILITOT 0.7 0.5 0.8 0.7  AST 26 31 36 30  ALT 26 22 24 22   ALKPHOS 181* 236* 363* 331*  PROT 4.8* 4.5* 4.5* 3.6*  ALBUMIN 2.2* 1.9* 1.8* <1.5*    Assessment and Plan: Pt with hx lung cancer, diverticulitis with assoc abscess, c diff; s/p left lower abd/pelvic drain placement 5/8; afebrile , WBC 28.6 (29.8), creat 1.36, drain fl cx- mod proteus hauseri, bacteroides; case reviewed by Dr. Darylene Epley- will plan f/u CT A/P with low contrast dose on 5/15; cont current tx for now   Output by Drain (mL) 06/11/23 0701 - 06/11/23 1900 06/11/23 1901 - 06/12/23 0700 06/12/23 0701 - 06/12/23 1900 06/12/23 1901 - 06/13/23 0700 06/13/23 0701 - 06/13/23 1325  Closed System Drain 1 Inferior;Medial Abdomen Bulb (JP) 10 Fr. 235 0 2300        Electronically Signed: D. Honore Lux, PA-C 06/13/2023, 1:19 PM   I spent a total of 15 Minutes at the the patient's bedside AND on the patient's hospital floor or unit, greater than 50% of which was counseling/coordinating care for  lower abdominal/pelvic abscess drain

## 2023-06-13 NOTE — Progress Notes (Signed)
 PROGRESS NOTE  Amanda Crawford  DOB: 11-25-1945  PCP: Neda Balk, MD QMV:[id number]  DOA: 05/29/2023  LOS: 15 days  Hospital Day: 16  Brief narrative: Amanda Crawford is a 78 y.o. female with PMH significant for small cell lung cancer, prior renal cell cancer with lung mets, DM2, CKD, recently hospitalized in March for septic shock diverticulitis and C. difficile infection, discharged on oral Augmentin  and oral vancomycin . With the completion of antibiotics, patient's symptoms improved but few days ago, started again 4/29, patient presented to the ED with complaint of persistent watery diarrhea weakness, poor oral intake, confusion.  Initial labs with WBC count significantly low at 0.4, platelet 48,  Blood culture was sent Started on broad-spectrum antibiotics for neutropenia  Admitted to [facility] Blood culture sent on admission is growing E. coli 4/30, C. difficile PCR positive 4/30, CT abdomen and pelvis showed sigmoid diverticulitis, 3.5 X 4.4 X 5.6 cm abscess. Consultations obtained from oncology, ID, general surgery and IR.  Subjective: Patient was seen and examined this morning. Propped up in bed.  Not in distress.  Alert, awake, slow to respond and oriented to place and person.  Husband at bedside In the last 24 hours, afebrile, heart rate in 100s, blood pressure in 100s Per RN, no stools yesterday.  Only a little smudge today. Also, patient had Foley catheter placed and yesterday and since then only had an output of 140 mL. Per RN, this morning, she was also noted to have worsening skin breakdown in the buttocks. Last blood work from this morning with BUN/creatinine 47/1.36, albumin less than 1.5  Assessment and plan: Sepsis POA Sigmoid diverticulitis and abscess E. coli bacteremia Immunocompromise status Presented with abdominal pain, diarrhea.   Imaging showed sigmoid diverticulitis with abscess  Per IR, abscess wass not amenable to percutaneous drainage. Per ID  recommendation, patient was tried on IV Rocephin , and IV Flagyl . 5/6, repeat CT abdomen showed enlarging abscess for which, she underwent CT-guided drain placement on 5/8. 5/11, repeat CT abdomen showed a residual collection. Per IR, plan to repeat CT abdomen pelvis tomorrow 5/15  Acute diarrhea  C. difficile colitis Patient had recently completed treatment for C. difficile in March with oral vancomycin  Presented this time with recurrence of diarrhea, poor oral intake 4/30, C. difficile PCR and antigen positive. Per ID recommendation, patient was started on Dificid  and completed the course on 5/11.  Currently on prophylactic vancomycin .  Acute on chronic anemia  Patient has chronic macrocytic anemia related to cancer  Hemoglobin improved after 1 unit PRBC.  Currently stable close to 9.  No active bleeding. Continue iron supplement Was also on aspirin , currently on hold Recent Labs    09/07/22 1110 10/12/22 1036 02/08/23 1110 03/14/23 1116 06/09/23 0525 06/10/23 0440 06/11/23 1127 06/12/23 0525 06/13/23 1445  HGB  --    < > 13.0   < > 9.8* 10.2* 9.6* 9.7* 10.3*  MCV  --    < > 101.7*   < > 105.8* 105.2* 105.2* 104.9* 104.8*  VITAMINB12 612  --  784  --   --   --   --   --   --    < > = values in this interval not displayed.    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 Maria Shiner as an outpatient.   Neutropenia due to chemotherapy.   Neupogen  added by oncology.  WBC count rising up gradually. Neutropenic precautions.   Recent Labs  Lab  06/09/23 0525 06/10/23 0440 06/11/23 1127 06/12/23 0525 06/13/23 1445  WBC 19.0* 33.3* 29.8* 28.6* 23.0*   Severe thrombocytopenia 2 units of platelet transfused so far.  Platelet low but gradually improving. Thankfully no bleeding. Continue SCDs for DVT prophylaxis.   Recent Labs  Lab 06/07/23 0525 06/08/23 0540 06/09/23 0525 06/10/23 0440 06/11/23 1127 06/12/23 0525 06/13/23 1445  PLT 42* 69* 99*  136* 137* 122* 102*    Extensive stage small cell lung cancer on chemotherapy Under the care of Dr. Maria Shiner Per oncology note, she had 3 cycles of chemotherapy, last on 05/21/2023.    Type 2 diabetes mellitus A1c 7.6 on 02/08/2023 PTA meds - Semglee  nightly. No longer on glimepiride  because of hypoglycemia episodes recently Currently on Semglee  14 units nightly. Continue SSI/Accu-Cheks Recent Labs  Lab 06/12/23 1157 06/12/23 1723 06/12/23 2224 06/13/23 0741 06/13/23 1134  GLUCAP 128* 146* 148* 146* 103*   AKI on CKD 3B Acute metabolic acidosis Renal function worsened and is stabilized above creatinine 1.3.  Serum bicarb level is worsening. Not on IV fluid currently.  Start sodium bicarb 1300 mg 3 times daily.  Recent Labs    06/04/23 0635 06/05/23 0900 06/06/23 0542 06/07/23 0525 06/08/23 0540 06/09/23 0525 06/10/23 1403 06/11/23 1127 06/12/23 0525 06/13/23 0500  BUN 16 19 22  25* 21 20 29* 38* 44* 47*  CREATININE 0.87 0.90 0.84 0.85 0.89 0.78 1.41* 1.31* 1.38* 1.36*  CO2 18* 20* 18* 20* 21* 24 19* 19* 19* 13*   Hypertension PTA meds- Coreg  half tab of 3.125 mg twice daily Coreg  currently on hold due to low blood pressure, poor oral intake and diarrhea.  Liver cirrhosis new finding on CT scan during her last hospital stay, will need outpatient GI follow-up for this   Hyperlipidemia statin on hold due to liver cirrhosis.  Recurrent acute urinary retention Failed voiding trial on 5/10.  Foley catheter reinserted  Currently has dark color urine in Foley  Severe hypoalbuminemia Albumin level less than 1.5 due to poor nutrition.   Has widespread anasarca.   Mobility: Impaired mobility due to chemo.  Continue to work with PT  Goals of care   Code Status: Limited: Do not attempt resuscitation (DNR) -DNR-LIMITED -Do Not Intubate/DNI      DVT prophylaxis:  Place and maintain sequential compression device Start: 05/31/23 0835   Antimicrobials: IV cefazolin , IV  Flagyl , and oral vancomycin  Fluid: Completed bicarb drip Consultants: Oncology, ID, general surgery Family Communication: Family not at at bedside  Status: Inpatient Level of care:  Telemetry Medical   Patient is from: Home Needs to continue in-hospital care: Ongoing workup for diarrhea, diverticulitis, abscess Anticipated d/c to: Pending clinical course   Diet:  Diet Order             DIET SOFT Room service appropriate? Yes; Fluid consistency: Thin  Diet effective now                   Scheduled Meds:  Chlorhexidine  Gluconate Cloth  6 each Topical Daily   insulin  aspart  0-15 Units Subcutaneous TID WC   insulin  aspart  0-5 Units Subcutaneous QHS   insulin  glargine-yfgn  14 Units Subcutaneous QHS   sodium bicarbonate   1,300 mg Oral TID   sodium chloride  flush  10-40 mL Intracatheter Q12H   sodium chloride  flush  5 mL Intracatheter Q8H   vancomycin   125 mg Oral BID   Zinc  Oxide   Topical BID    PRN meds: albuterol , morphine  injection, [  DISCONTINUED] ondansetron  **OR** ondansetron  (ZOFRAN ) IV, mouth rinse, sodium chloride  flush   Infusions:   ceFEPime  (MAXIPIME ) IV 2 g (06/13/23 1103)   metronidazole  500 mg (06/13/23 1408)    Antimicrobials: Anti-infectives (From admission, onward)    Start     Dose/Rate Route Frequency Ordered Stop   06/12/23 1130  ceFEPIme  (MAXIPIME ) 2 g in sodium chloride  0.9 % 100 mL IVPB        2 g 200 mL/hr over 30 Minutes Intravenous Every 12 hours 06/12/23 1044     06/11/23 1100  vancomycin  (VANCOCIN ) capsule 125 mg        125 mg Oral 2 times daily 06/11/23 1006     06/10/23 2200  metroNIDAZOLE  (FLAGYL ) IVPB 500 mg        500 mg 100 mL/hr over 60 Minutes Intravenous Every 8 hours 06/10/23 2103     06/06/23 2200  metroNIDAZOLE  (FLAGYL ) tablet 500 mg  Status:  Discontinued        500 mg Oral Every 12 hours 06/06/23 0951 06/10/23 2103   05/31/23 1600  vancomycin  (VANCOREADY) IVPB 1250 mg/250 mL  Status:  Discontinued        1,250  mg 166.7 mL/hr over 90 Minutes Intravenous Every 48 hours 05/29/23 1437 05/30/23 1416   05/31/23 1445  fidaxomicin  (DIFICID ) tablet 200 mg        200 mg Oral 2 times daily 05/31/23 1353 06/10/23 0959   05/31/23 1345  vancomycin  (VANCOCIN ) capsule 125 mg  Status:  Discontinued        125 mg Oral Daily 05/31/23 1253 05/31/23 1353   05/31/23 1000  fidaxomicin  (DIFICID ) tablet 200 mg  Status:  Discontinued        200 mg Oral 2 times daily 05/31/23 0812 05/31/23 0813   05/31/23 1000  metroNIDAZOLE  (FLAGYL ) IVPB 500 mg  Status:  Discontinued        500 mg 100 mL/hr over 60 Minutes Intravenous Every 12 hours 05/31/23 0813 06/06/23 0951   05/29/23 2300  cefTRIAXone  (ROCEPHIN ) 2 g in sodium chloride  0.9 % 100 mL IVPB  Status:  Discontinued        2 g 200 mL/hr over 30 Minutes Intravenous Every 24 hours 05/29/23 2211 06/12/23 1044   05/29/23 1700  ceFEPIme  (MAXIPIME ) 2 g in sodium chloride  0.9 % 100 mL IVPB  Status:  Discontinued        2 g 200 mL/hr over 30 Minutes Intravenous Every 12 hours 05/29/23 1437 05/29/23 2211   05/29/23 1600  metroNIDAZOLE  (FLAGYL ) IVPB 500 mg  Status:  Discontinued        500 mg 100 mL/hr over 60 Minutes Intravenous Every 12 hours 05/29/23 1511 05/31/23 0812   05/29/23 1500  vancomycin  (VANCOREADY) IVPB 2000 mg/400 mL        2,000 mg 200 mL/hr over 120 Minutes Intravenous  Once 05/29/23 1437 05/29/23 1724   05/29/23 0430  ceFEPIme  (MAXIPIME ) 2 g in sodium chloride  0.9 % 100 mL IVPB        2 g 200 mL/hr over 30 Minutes Intravenous  Once 05/29/23 0416 05/29/23 0523       Objective: Vitals:   06/13/23 1403 06/13/23 1434  BP: (!) 121/43 118/62  Pulse: (!) 119 (!) 114  Resp: 16 17  Temp:  98.4 F (36.9 C)  SpO2: 96% 100%    Intake/Output Summary (Last 24 hours) at 06/13/2023 1520 Last data filed at 06/13/2023 0700 Gross per 24 hour  Intake 932.79 ml  Output  975 ml  Net -42.21 ml   Filed Weights   05/29/23 0304 05/29/23 1416  Weight: 93.4 kg 89.6 kg    Weight change:  Body mass index is 33.9 kg/m.   Physical Exam: General exam: Very pleasant, elderly Caucasian female.  Not in pain.   Skin: No rashes, lesions or ulcers. HEENT: Atraumatic, normocephalic, no obvious bleeding Lungs: Clear to auscultation bilaterally on low-flow oxygen. CVS: S1, S2, no murmur,   GI/Abd: Soft, mild tenderness remains in lower abdomen, nondistended, bowel sound present,   CNS: Alert, awake, oriented x 3.  Slow to respond Psychiatry: Mood appropriate Extremities: 1+ bilateral pedal edema, no calf tenderness  Data Review: I have personally reviewed the laboratory data and studies available.  F/u labs ordered Unresulted Labs (From admission, onward)     Start     Ordered   06/11/23 0640  CBC with Differential/Platelet  Daily,   R     Question:  Specimen collection method  Answer:  Unit=Unit collect   06/11/23 1610   06/11/23 0640  Comprehensive metabolic panel  Daily,   R     Question:  Specimen collection method  Answer:  Unit=Unit collect   06/11/23 9604   06/02/23 0741  Prepare platelet pheresis  (Blood Administration Adult)  Once,   R       Question Answer Comment  Number of Apheresis Units (1 unit of apheresis platelets will increase platelets 30,000/mL in an avg sized adult) 1 unit   Transfusion Indications Plt = 10,000   Date/Time blood product needed For transfusion   If emergent release call blood bank Not emergent release      06/02/23 0741            Signed, Hoyt Macleod, MD [facility] 06/13/2023

## 2023-06-13 NOTE — Plan of Care (Signed)

## 2023-06-13 NOTE — Progress Notes (Signed)
 Physical Therapy Treatment Patient Details Name: Amanda Crawford MRN: [id number] DOB: 1945-09-29 Today's Date: 06/13/2023   History of Present Illness Amanda Crawford is a 78 y.o. female admitted with sepsis; 4/30 C. difficile PCR and antigen positive. PMH significant for small cell lung cancer, prior renal cell cancer with lung mets, DM2, CKD, recently hospitalized in March for septic shock diverticulitis and C. difficile infection    PT Comments  Pt reports increased fatigue, pain, difficulty breathing (pt now on 4L O2). Assisted pt with rolling L and R x 4 for pericare as pt was incontinent of bowel with 1st roll. Pt fatigued after rolling and scooting to head of bed with +2 max assist. Repositioned on R side at end of session for pressure relief from her painful L buttock.  Overall decline in activity tolerance today. Patient will benefit from continued inpatient follow up therapy, <3 hours/day. Family is not able to provide needed level of care in pt's current condition.    If plan is discharge home, recommend the following: Two people to help with walking and/or transfers;A lot of help with bathing/dressing/bathroom;Assistance with cooking/housework;Assist for transportation;Help with stairs or ramp for entrance   Can travel by private vehicle     No  Equipment Recommendations  Wheelchair cushion (measurements PT);Wheelchair (measurements PT);Hoyer lift;Hospital bed    Recommendations for Other Services       Precautions / Restrictions Precautions Precautions: Fall Recall of Precautions/Restrictions: Intact Precaution/Restrictions Comments: L drain Restrictions Weight Bearing Restrictions Per Provider Order: No     Mobility  Bed Mobility Overal bed mobility: Needs Assistance Bed Mobility: Rolling Rolling: Mod assist         General bed mobility comments: rolled L and R x 2 each for pericare; +2 max to scoot up to [facility]; repositioned on R side at end of session for pressure  relief; pt fatigued after rolling, SpO2 100% on 4L , HR 114. Pt incontinent of bowel with 1st roll.    Transfers                        Ambulation/Gait                   Stairs             Wheelchair Mobility     Tilt Bed    Modified Rankin (Stroke Patients Only)       Balance                                            Communication Communication Communication: No apparent difficulties  Cognition Arousal: Alert Behavior During Therapy: WFL for tasks assessed/performed   PT - Cognitive impairments: No apparent impairments                         Following commands: Intact      Cueing Cueing Techniques: Verbal cues  Exercises      General Comments        Pertinent Vitals/Pain Pain Assessment Faces Pain Scale: Hurts even more Pain Location: butt and abdomen Pain Descriptors / Indicators: Discomfort, Grimacing Pain Intervention(s): Limited activity within patient's tolerance, Monitored during session, Repositioned, Premedicated before session    Home Living  Prior Function            PT Goals (current goals can now be found in the care plan section) Acute Rehab PT Goals Patient Stated Goal: Get back to her flower garden PT Goal Formulation: With patient/family Time For Goal Achievement: 06/14/23 Potential to Achieve Goals: Fair Progress towards PT goals: Not progressing toward goals - comment (medical decline)    Frequency    Min 2X/week      PT Plan      Co-evaluation PT/OT/SLP Co-Evaluation/Treatment: Yes Reason for Co-Treatment: Complexity of the patient's impairments (multi-system involvement);For patient/therapist safety;To address functional/ADL transfers PT goals addressed during session: Mobility/safety with mobility        AM-PAC PT "6 Clicks" Mobility   Outcome Measure  Help needed turning from your back to your side while in a flat bed  without using bedrails?: A Lot Help needed moving from lying on your back to sitting on the side of a flat bed without using bedrails?: Total Help needed moving to and from a bed to a chair (including a wheelchair)?: Total Help needed standing up from a chair using your arms (e.g., wheelchair or bedside chair)?: Total Help needed to walk in hospital room?: Total Help needed climbing 3-5 steps with a railing? : Total 6 Click Score: 7    End of Session   Activity Tolerance: Patient limited by fatigue Patient left: in bed;with call bell/phone within reach;with family/visitor present;with bed alarm set Nurse Communication: Mobility status PT Visit Diagnosis: Muscle weakness (generalized) (M62.81);Difficulty in walking, not elsewhere classified (R26.2);Pain     Time: 1610-9604 PT Time Calculation (min) (ACUTE ONLY): 26 min  Charges:    $Therapeutic Activity: 8-22 mins PT General Charges $$ ACUTE PT VISIT: 1 Visit                     Daymon Evans PT 06/13/2023  Acute Rehabilitation Services  Office [phone]

## 2023-06-13 NOTE — Consult Note (Signed)
 WOC Nurse Consult Note: Reason for Consult: worsening of ICD (irritant contact dermatitis) Wound type: ICD with superficial skin loss; gluteal fissure Pressure Injury POA: NA Measurement: see nursing flow sheets Wound bed: see nursing flow sheets Drainage (amount, consistency, odor) see nursing flow sheets Periwound: intact  Dressing procedure/placement/frequency: DC foam dressings, switch from Gerhardt's to triple paste  Order low air loss bed for moisture management  Discussed POC with patient and bedside nurse.  Re consult if needed, will not follow at this time. Thanks  Bree Heinzelman [employer], RN,CWOCN, CNS, CWON-AP [phone])

## 2023-06-13 NOTE — Progress Notes (Signed)
 Patient's buttocks and perineal area cleaned with soap and water . Vashe wound cleanser applied and dried. Gerhardt's butt cream applied. Patient turned to right side with pillows supporting. Pictures provides in media. Wound consult put in for worsening dermatitis on left buttocks and coccyx moisture associated wound. Sanguineous drainage noted to coccyx area and dermatitis area.  Foley care and peri care performed.  Noticed that right arm swollen and cool to touch. Dextrose  5% 0.45 NaCl running but is now stopped. PIV in right arm removed. Pharmacy called. Heat packs applied and arm elevated.

## 2023-06-14 ENCOUNTER — Inpatient Hospital Stay (HOSPITAL_COMMUNITY)

## 2023-06-14 DIAGNOSIS — A4151 Sepsis due to Escherichia coli [E. coli]: Secondary | ICD-10-CM | POA: Diagnosis not present

## 2023-06-14 LAB — CBC WITH DIFFERENTIAL/PLATELET
Abs Immature Granulocytes: 0.34 10*3/uL — ABNORMAL HIGH (ref 0.00–0.07)
Basophils Absolute: 0 10*3/uL (ref 0.0–0.1)
Basophils Relative: 0 %
Eosinophils Absolute: 0 10*3/uL (ref 0.0–0.5)
Eosinophils Relative: 0 %
HCT: 26.1 % — ABNORMAL LOW (ref 36.0–46.0)
Hemoglobin: 8.3 g/dL — ABNORMAL LOW (ref 12.0–15.0)
Immature Granulocytes: 2 %
Lymphocytes Relative: 7 %
Lymphs Abs: 1.1 10*3/uL (ref 0.7–4.0)
MCH: 33.6 pg (ref 26.0–34.0)
MCHC: 31.8 g/dL (ref 30.0–36.0)
MCV: 105.7 fL — ABNORMAL HIGH (ref 80.0–100.0)
Monocytes Absolute: 1 10*3/uL (ref 0.1–1.0)
Monocytes Relative: 6 %
Neutro Abs: 14.1 10*3/uL — ABNORMAL HIGH (ref 1.7–7.7)
Neutrophils Relative %: 85 %
Platelets: 53 10*3/uL — ABNORMAL LOW (ref 150–400)
RBC: 2.47 MIL/uL — ABNORMAL LOW (ref 3.87–5.11)
RDW: 20.7 % — ABNORMAL HIGH (ref 11.5–15.5)
WBC: 16.6 10*3/uL — ABNORMAL HIGH (ref 4.0–10.5)
nRBC: 0 % (ref 0.0–0.2)

## 2023-06-14 LAB — COMPREHENSIVE METABOLIC PANEL WITH GFR
ALT: 21 U/L (ref 0–44)
AST: 38 U/L (ref 15–41)
Albumin: 2.6 g/dL — ABNORMAL LOW (ref 3.5–5.0)
Alkaline Phosphatase: 422 U/L — ABNORMAL HIGH (ref 38–126)
Anion gap: 8 (ref 5–15)
BUN: 60 mg/dL — ABNORMAL HIGH (ref 8–23)
CO2: 16 mmol/L — ABNORMAL LOW (ref 22–32)
Calcium: 10.5 mg/dL — ABNORMAL HIGH (ref 8.9–10.3)
Chloride: 107 mmol/L (ref 98–111)
Creatinine, Ser: 1.96 mg/dL — ABNORMAL HIGH (ref 0.44–1.00)
GFR, Estimated: 25 mL/min — ABNORMAL LOW (ref >60)
Glucose, Bld: 89 mg/dL (ref 70–99)
Potassium: 4.3 mmol/L (ref 3.5–5.1)
Sodium: 131 mmol/L — ABNORMAL LOW (ref 135–145)
Total Bilirubin: 1.7 mg/dL — ABNORMAL HIGH (ref 0.0–1.2)
Total Protein: 4.5 g/dL — ABNORMAL LOW (ref 6.5–8.1)

## 2023-06-14 LAB — GLUCOSE, CAPILLARY
Glucose-Capillary: 82 mg/dL (ref 70–99)
Glucose-Capillary: 102 mg/dL — ABNORMAL HIGH (ref 70–99)
Glucose-Capillary: 81 mg/dL (ref 70–99)
Glucose-Capillary: 80 mg/dL (ref 70–99)

## 2023-06-14 LAB — LACTIC ACID, PLASMA: Lactic Acid, Venous: 2 mmol/L (ref 0.5–1.9)

## 2023-06-14 MED ORDER — SODIUM CHLORIDE 0.9 % IV SOLN
2.0000 g | INTRAVENOUS | Status: DC
  Administered 2023-06-15: 2 g via INTRAVENOUS
  Filled 2023-06-14: qty 12.5

## 2023-06-14 MED ORDER — SODIUM CHLORIDE (PF) 0.9 % IJ SOLN
INTRAMUSCULAR | Status: AC
  Filled 2023-06-14: qty 50

## 2023-06-14 MED ORDER — SODIUM CHLORIDE 0.9 % IV SOLN: INTRAVENOUS | Status: DC

## 2023-06-14 NOTE — Plan of Care (Signed)
  Problem: Education: Goal: Knowledge of General Education information will improve Description: Including pain rating scale, medication(s)/side effects and non-pharmacologic comfort measures Outcome: Progressing   Problem: Health Behavior/Discharge Planning: Goal: Ability to manage health-related needs will improve Outcome: Progressing   Problem: Clinical Measurements: Goal: Ability to maintain clinical measurements within normal limits will improve Outcome: Progressing Goal: Will remain free from infection Outcome: Progressing Goal: Diagnostic test results will improve Outcome: Progressing Goal: Respiratory complications will improve Outcome: Progressing Goal: Cardiovascular complication will be avoided Outcome: Progressing   Problem: Activity: Goal: Risk for activity intolerance will decrease Outcome: Progressing   Problem: Elimination: Goal: Will not experience complications related to bowel motility Outcome: Progressing Goal: Will not experience complications related to urinary retention Outcome: Progressing   Problem: Pain Managment: Goal: General experience of comfort will improve and/or be controlled Outcome: Progressing   Problem: Safety: Goal: Ability to remain free from injury will improve Outcome: Progressing   Problem: Skin Integrity: Goal: Risk for impaired skin integrity will decrease Outcome: Progressing

## 2023-06-14 NOTE — Progress Notes (Signed)
 PROGRESS NOTE  Amanda Crawford  DOB: Sep 14, 1945  PCP: Neda Balk, MD ZOX:[id number]  DOA: 05/29/2023  LOS: 16 days  Hospital Day: 17  Brief narrative: Amanda Crawford is a 78 y.o. female with PMH significant for small cell lung cancer, prior renal cell cancer with lung mets, DM2, CKD, recently hospitalized in March for septic shock diverticulitis and C. difficile infection, discharged on oral Augmentin  and oral vancomycin . With the completion of antibiotics, patient's symptoms improved but few days ago, started again 4/29, patient presented to the ED with complaint of persistent watery diarrhea weakness, poor oral intake, confusion.  Initial labs with WBC count significantly low at 0.4, platelet 48,  Blood culture was sent Started on broad-spectrum antibiotics for neutropenia  Admitted to [facility] Blood culture sent on admission is growing E. coli 4/30, C. difficile PCR positive 4/30, CT abdomen and pelvis showed sigmoid diverticulitis, 3.5 X 4.4 X 5.6 cm abscess. Consultations obtained from oncology, ID, general surgery and IR.  Subjective: Patient was seen and examined this morning. Pleasant elderly Caucasian female.  Frail.  Progressively getting worse. Lying down in bed.  Alert, awake, able to have a conversation.  Husband, daughter and son at bedside. Underwent CT scan this morning.  Result was pending at the time of my evaluation. Over the last several days, patient's appetite has been down, her albumin level has been low, she has had progressive anasarca.  In the last 24 hours, she has progressive decline in blood pressure, rising creatinine. Seen by palliative care this morning  Assessment and plan: Sepsis POA Sigmoid diverticulitis and abscess E. coli bacteremia Immunocompromise status Presented with abdominal pain, diarrhea.   Imaging showed sigmoid diverticulitis with abscess  Per IR, abscess wass not amenable to percutaneous drainage. Per ID recommendation, patient  was tried on IV Rocephin , and IV Flagyl . 5/6, repeat CT abdomen showed enlarging abscess for which, she underwent CT-guided drain placement on 5/8. 5/11, repeat CT abdomen showed a residual collection. 5/15, CT abdomen was repeated and followed by IR.  Abscess size seem to be improving but continues to have free air throughout the abdomen related to perforated diverticulitis.  Also has moderate to large amount of ascites related to liver cirrhosis.  Also suspected adynamic ileus. I have recommended to continue current treatment.  Once drain output is minimal, noted a plan to do drain injection study.  Hypotension AKI on CKD 3B Acute metabolic acidosis Lactic acidosis Over the last several days, patient's appetite has been down, her albumin level has been low, she has had progressive anasarca.  In the last 24 hours, she has progressive decline in blood pressure, rising creatinine. Patient was given 1 dose of IV albumin last night. I started IV hydration today Also on sodium bicarb 1300 mg 3 times daily. Recent Labs    06/05/23 0900 06/06/23 0542 06/07/23 0525 06/08/23 0540 06/09/23 0525 06/10/23 1403 06/11/23 1127 06/12/23 0525 06/13/23 0500 06/14/23 0500  BUN 19 22 25* 21 20 29* 38* 44* 47* 60*  CREATININE 0.90 0.84 0.85 0.89 0.78 1.41* 1.31* 1.38* 1.36* 1.96*  CO2 20* 18* 20* 21* 24 19* 19* 19* 13* 16*    Recent Labs  Lab 06/14/23 0129  LATICACIDVEN 2.0*    Acute diarrhea  C. difficile colitis Patient had recently completed treatment for C. difficile in March with oral vancomycin  Presented this time with recurrence of diarrhea, poor oral intake 4/30, C. difficile PCR and antigen positive. Per ID recommendation, patient was started on Dificid  and  completed the course on 5/11.  Currently on prophylactic vancomycin . Last bowel movement 2 days ago.  Acute on chronic anemia  Patient has chronic macrocytic anemia related to cancer  Hemoglobin improved after 1 unit PRBC.   Currently stable close to 9.  No active bleeding. Continue iron supplement Was also on aspirin , currently on hold Recent Labs    09/07/22 1110 10/12/22 1036 02/08/23 1110 03/14/23 1116 06/10/23 0440 06/11/23 1127 06/12/23 0525 06/13/23 1445 06/14/23 0742  HGB  --    < > 13.0   < > 10.2* 9.6* 9.7* 10.3* 8.3*  MCV  --    < > 101.7*   < > 105.2* 105.2* 104.9* 104.8* 105.7*  VITAMINB12 612  --  784  --   --   --   --   --   --    < > = values in this interval not displayed.    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 Maria Shiner as an outpatient.   Neutropenia due to chemotherapy.   Neupogen  added by oncology.  WBC count was elevated due to Neupogen  and now downtrending.  16.6 today. Neutropenic precautions.   Recent Labs  Lab 06/10/23 0440 06/11/23 1127 06/12/23 0525 06/13/23 1445 06/14/23 0129 06/14/23 0742  WBC 33.3* 29.8* 28.6* 23.0*  --  16.6*  LATICACIDVEN  --   --   --   --  2.0*  --    Severe thrombocytopenia 2 units of platelet transfused so far.  Platelet low but thankfully no bleeding. Continue SCDs for DVT prophylaxis.   Recent Labs  Lab 06/08/23 0540 06/09/23 0525 06/10/23 0440 06/11/23 1127 06/12/23 0525 06/13/23 1445 06/14/23 0742  PLT 69* 99* 136* 137* 122* 102* 53*    Extensive stage small cell lung cancer on chemotherapy Under the care of Dr. Maria Shiner Per oncology note, she had 3 cycles of chemotherapy, last on 05/21/2023.    Type 2 diabetes mellitus A1c 7.6 on 02/08/2023 PTA meds - Semglee  nightly. No longer on glimepiride  because of hypoglycemia episodes recently Currently on Semglee  14 units nightly. Continue SSI/Accu-Cheks Recent Labs  Lab 06/13/23 1134 06/13/23 1643 06/13/23 2101 06/14/23 0742 06/14/23 1132  GLUCAP 103* 111* 104* 82 102*   H/o hypertension PTA meds- Coreg  half tab of 3.125 mg twice daily Coreg  currently on hold due to low blood pressure, poor oral intake and diarrhea.  Liver  cirrhosis Moderate to severe ascites new finding on CT scan during her last hospital stay, Subsequent CT scans have also shown moderate to severe ascites.   Hyperlipidemia statin on hold due to liver cirrhosis.  Recurrent acute urinary retention Failed voiding trial on 5/10.  Foley catheter reinserted  Currently has dark color urine in Foley  Severe hypoalbuminemia Albumin level less than 1.5 due to poor nutrition.   Has widespread anasarca. Given 1 dose of IV albumin last night   Mobility: Impaired mobility due to chemo.  Continue to work with PT  Goals of care   Code Status: Limited: Do not attempt resuscitation (DNR) -DNR-LIMITED -Do Not Intubate/DNI   Palliative care consult appreciated    DVT prophylaxis:  Place and maintain sequential compression device Start: 05/31/23 0835   Antimicrobials: IV cefazolin , IV Flagyl , and oral vancomycin  Fluid: Started on Normal Saline Consultants: Oncology, ID, general surgery Family Communication: Multiple at at bedside  Status: Inpatient Level of care:  Telemetry Medical   Patient is from: Home Needs to continue in-hospital care: Ongoing workup for diarrhea,  diverticulitis, abscess Anticipated d/c to: Pending clinical course.  Prognosis not looking good.   Diet:  Diet Order             DIET SOFT Room service appropriate? Yes; Fluid consistency: Thin  Diet effective now                   Scheduled Meds:  Chlorhexidine  Gluconate Cloth  6 each Topical Daily   insulin  aspart  0-15 Units Subcutaneous TID WC   insulin  aspart  0-5 Units Subcutaneous QHS   insulin  glargine-yfgn  14 Units Subcutaneous QHS   sodium bicarbonate   1,300 mg Oral TID   sodium chloride  flush  10-40 mL Intracatheter Q12H   sodium chloride  flush  5 mL Intracatheter Q8H   vancomycin   125 mg Oral BID   Zinc  Oxide   Topical BID    PRN meds: albuterol , morphine  injection, [DISCONTINUED] ondansetron  **OR** ondansetron  (ZOFRAN ) IV, mouth rinse,  sodium chloride  flush   Infusions:   sodium chloride  100 mL/hr at 06/14/23 1037   [START ON 06/15/2023] ceFEPime  (MAXIPIME ) IV     metronidazole  500 mg (06/14/23 0605)    Antimicrobials: Anti-infectives (From admission, onward)    Start     Dose/Rate Route Frequency Ordered Stop   06/15/23 1000  ceFEPIme  (MAXIPIME ) 2 g in sodium chloride  0.9 % 100 mL IVPB        2 g 200 mL/hr over 30 Minutes Intravenous Every 24 hours 06/14/23 1128     06/12/23 1130  ceFEPIme  (MAXIPIME ) 2 g in sodium chloride  0.9 % 100 mL IVPB  Status:  Discontinued        2 g 200 mL/hr over 30 Minutes Intravenous Every 12 hours 06/12/23 1044 06/14/23 1128   06/11/23 1100  vancomycin  (VANCOCIN ) capsule 125 mg        125 mg Oral 2 times daily 06/11/23 1006     06/10/23 2200  metroNIDAZOLE  (FLAGYL ) IVPB 500 mg        500 mg 100 mL/hr over 60 Minutes Intravenous Every 8 hours 06/10/23 2103     06/06/23 2200  metroNIDAZOLE  (FLAGYL ) tablet 500 mg  Status:  Discontinued        500 mg Oral Every 12 hours 06/06/23 0951 06/10/23 2103   05/31/23 1600  vancomycin  (VANCOREADY) IVPB 1250 mg/250 mL  Status:  Discontinued        1,250 mg 166.7 mL/hr over 90 Minutes Intravenous Every 48 hours 05/29/23 1437 05/30/23 1416   05/31/23 1445  fidaxomicin  (DIFICID ) tablet 200 mg        200 mg Oral 2 times daily 05/31/23 1353 06/10/23 0959   05/31/23 1345  vancomycin  (VANCOCIN ) capsule 125 mg  Status:  Discontinued        125 mg Oral Daily 05/31/23 1253 05/31/23 1353   05/31/23 1000  fidaxomicin  (DIFICID ) tablet 200 mg  Status:  Discontinued        200 mg Oral 2 times daily 05/31/23 0812 05/31/23 0813   05/31/23 1000  metroNIDAZOLE  (FLAGYL ) IVPB 500 mg  Status:  Discontinued        500 mg 100 mL/hr over 60 Minutes Intravenous Every 12 hours 05/31/23 0813 06/06/23 0951   05/29/23 2300  cefTRIAXone  (ROCEPHIN ) 2 g in sodium chloride  0.9 % 100 mL IVPB  Status:  Discontinued        2 g 200 mL/hr over 30 Minutes Intravenous Every 24 hours  05/29/23 2211 06/12/23 1044   05/29/23 1700  ceFEPIme  (MAXIPIME ) 2  g in sodium chloride  0.9 % 100 mL IVPB  Status:  Discontinued        2 g 200 mL/hr over 30 Minutes Intravenous Every 12 hours 05/29/23 1437 05/29/23 2211   05/29/23 1600  metroNIDAZOLE  (FLAGYL ) IVPB 500 mg  Status:  Discontinued        500 mg 100 mL/hr over 60 Minutes Intravenous Every 12 hours 05/29/23 1511 05/31/23 0812   05/29/23 1500  vancomycin  (VANCOREADY) IVPB 2000 mg/400 mL        2,000 mg 200 mL/hr over 120 Minutes Intravenous  Once 05/29/23 1437 05/29/23 1724   05/29/23 0430  ceFEPIme  (MAXIPIME ) 2 g in sodium chloride  0.9 % 100 mL IVPB        2 g 200 mL/hr over 30 Minutes Intravenous  Once 05/29/23 0416 05/29/23 0523       Objective: Vitals:   06/14/23 0546 06/14/23 1345  BP: (!) 103/51 127/71  Pulse: (!) 105 (!) 117  Resp: 16 20  Temp: (!) 97.5 F (36.4 C) 97.7 F (36.5 C)  SpO2: 98% 96%    Intake/Output Summary (Last 24 hours) at 06/14/2023 1414 Last data filed at 06/14/2023 0400 Gross per 24 hour  Intake 571.15 ml  Output 125 ml  Net 446.15 ml   Filed Weights   05/29/23 0304 05/29/23 1416  Weight: 93.4 kg 89.6 kg   Weight change:  Body mass index is 33.9 kg/m.   Physical Exam: General exam: Very pleasant, elderly Caucasian female.  Not in pain.   Skin: No rashes, lesions or ulcers. HEENT: Atraumatic, normocephalic, no obvious bleeding Lungs: Clear to auscultation bilaterally on low-flow oxygen. CVS: S1, S2, no murmur,   GI/Abd: Soft, continues to have tenderness in lower abdomen, nondistended, bowel sound present,   CNS: Alert, awake, oriented x 3.  Slow to respond Psychiatry: Mood appropriate Extremities: 1+ bilateral pedal edema, no calf tenderness  Data Review: I have personally reviewed the laboratory data and studies available.  F/u labs ordered Unresulted Labs (From admission, onward)     Start     Ordered   06/11/23 0640  CBC with Differential/Platelet  Daily,   R      Question:  Specimen collection method  Answer:  Unit=Unit collect   06/11/23 1191   06/11/23 0640  Comprehensive metabolic panel  Daily,   R     Question:  Specimen collection method  Answer:  Unit=Unit collect   06/11/23 4782   06/02/23 0741  Prepare platelet pheresis  (Blood Administration Adult)  Once,   R       Question Answer Comment  Number of Apheresis Units (1 unit of apheresis platelets will increase platelets 30,000/mL in an avg sized adult) 1 unit   Transfusion Indications Plt = 10,000   Date/Time blood product needed For transfusion   If emergent release call blood bank Not emergent release      06/02/23 0741            Signed, Hoyt Macleod, MD [facility] 06/14/2023

## 2023-06-14 NOTE — Progress Notes (Signed)
   06/14/23 1734  Vitals  Temp (!) 97.5 F (36.4 C)  Temp Source Oral  BP 118/64  MAP (mmHg) 80  BP Location Left Arm  BP Method Automatic  Patient Position (if appropriate) Lying  Pulse Rate (!) 115  Pulse Rate Source Monitor  Resp 18  MEWS COLOR  MEWS Score Color Yellow  Oxygen Therapy  SpO2 95 %  O2 Device Room Air  MEWS Score  MEWS Temp 0  MEWS Systolic 0  MEWS Pulse 2  MEWS RR 0  MEWS LOC 0  MEWS Score 2  Provider Notification  Provider Name/Title Dahal, MD  Date Provider Notified 06/14/23  Time Provider Notified 1745  Method of Notification Page  Notification Reason Change in status (HR 115)  Test performed and critical result HR 115  Date Critical Result Received 06/14/23  Time Critical Result Received 1734  Provider response No new orders  Date of Provider Response 06/14/23  Time of Provider Response 1810

## 2023-06-14 NOTE — Progress Notes (Signed)
  Daily Progress Note   Patient Name: Amanda Crawford       Date: 06/14/2023 DOB: 1946/01/05  Age: 78 y.o. MRN#: [id number] Attending Physician: Hoyt Macleod, MD Primary Care Physician: Neda Balk, MD Admit Date: 05/29/2023 Length of Stay: 16 days  Reason for Consultation/Follow-up: Establishing goals of care  Subjective:   CC: Patient resting in bed, daughter and son at bedside, repeat CT scan was done and results were pending at the time of my visit this am.  Patient continues to complain of weakness, poor appetite, mild generalized abdominal discomfort.    Following up regarding complex medical decision making.  Subjective: Reviewed EMR prior to presenting to bedside.  Oncology NP in the room at the time of today's visit, IR note reviewed.  Continue to monitor hospital course for now, family wants to see today's CT results and further recommendations from hospital medicine, IR and infectious disease standpoint.   Review of Systems Abdominal discomfort mild.  Objective:   Vital Signs:  BP (!) 103/51 (BP Location: Left Arm)   Pulse (!) 105   Temp (!) 97.5 F (36.4 C) (Oral)   Resp 16   Ht 5\' 4"  (1.626 m)   Wt 89.6 kg   SpO2 98%   BMI 33.90 kg/m   Physical Exam: General: NAD, alert, elderly, chronically ill-appearing Cardiovascular: RRR Respiratory: no increased work of breathing noted, not in respiratory distress Abdomen: distended Neuro: A&Ox4, following commands easily Psych: appropriately answers all questions  Imaging:  I personally reviewed recent imaging.   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 difficile who was admitted on 05/29/2023 secondary to weakness and confusion. During hospitalization patient was found to have NSTEMI and being  neutropenic. Patient found to have evidence of sepsis secondary to sigmoid diverticulitis with abscess and recurrent difficile colitis with E. coli bacteremia. General surgery, ID, and IR consulted during hospitalization for recommendations. Palliative medicine team consulted to assist with complex medical decision making.   Recommendations/Plan: # Complex medical decision making/goals of care:  -      Noted palliative medicine team continue to follow along and engage in conversations as able and appropriate to assist with supporting next steps. Family wants to wait for today's CT results, they hope to get more updates from [facility] IR and ID standpoints. Very gently today, I introduced comfort care approach and how that would lead to hospice type care to the patient's son and daughter at bedside. For now, they wish to continue with current mode of care but appreciated the information I provided. PMT to continue to follow, also updated [facility] MD colleague Dr Gwynneth Lessen regarding today's palliative discussion.    -  Code Status: Limited: Do not attempt resuscitation (DNR) -DNR-LIMITED -Do Not Intubate/DNI      DNR DNI   # Psychosocial Support:  - Husband, son, daughter  # Discharge Planning: To Be Determined  Discussed with: Patient, patient's family at bedside.   Thank you for allowing the palliative care team to participate in the care Amanda Crawford. High MDM Lujean Sake MD.  Palliative Care Provider PMT # [phone]  If patient remains symptomatic despite maximum doses, please call PMT at [phone] between 0700 and 1900. Outside of these hours, please call attending, as PMT does not have night coverage.

## 2023-06-14 NOTE — Progress Notes (Signed)
 Referring Physician(s): Burton,V  Supervising Physician: Elene Griffes  Patient Status:  [facility] - In-pt  Chief Complaint: Abdominal-pelvic pain/ diverticular abscess,  lung cancer; s/p pelvic drain placement 5/8    Subjective: Patient without new complaints;  family in room; still has weakness, poor appetite, some lower abdominal/pelvic discomfort   Allergies: Culturelle probiotics [lactobacillus] and Bactrim  ds [sulfamethoxazole -trimethoprim ]  Medications: Prior to Admission medications   Medication Sig Start Date End Date Taking? Authorizing Provider  allopurinol  (ZYLOPRIM ) 300 MG tablet TAKE 1 TABLET BY MOUTH EVERY DAY 03/15/23  Yes Neda Balk, MD  aspirin  EC 81 MG tablet Take 81 mg by mouth daily.   Yes [provider]  atorvastatin  (LIPITOR) 20 MG tablet TAKE 1 TABLET BY MOUTH EVERY DAY 05/21/23  Yes Neda Balk, MD  carvedilol  (COREG ) 3.125 MG tablet Take 0.5 tablets (1.5625 mg total) by mouth 2 (two) times daily with a meal. 02/20/23  Yes Byrum, Delora Ferry, MD  dexamethasone  (DECADRON ) 4 MG tablet Take 2 tablets daily for 2 days, on days 4 and 5.Take with food. Every 21 days. 03/14/23  Yes Ivor Mars, MD  diphenhydramine -acetaminophen  (TYLENOL  PM) 25-500 MG TABS Take 1 tablet by mouth at bedtime as needed (sleep).   Yes [provider]  ferrous sulfate  325 (65 FE) MG tablet Take 325 mg by mouth at bedtime.   Yes [provider]  fluconazole  (DIFLUCAN ) 100 MG tablet Take 1 tablet (100 mg total) by mouth daily. 05/08/23  Yes Ennever, Sherryll Donald, MD  glimepiride  (AMARYL ) 2 MG tablet TAKE 1 TABLET BY MOUTH EVERY DAY WITH BREAKFAST 05/21/23  Yes Neda Balk, MD  hyoscyamine  (LEVSIN SL) 0.125 MG SL tablet Place 1 tablet (0.125 mg total) under the tongue every 6 (six) hours as needed. 04/16/23  Yes Neda Balk, MD  ondansetron  (ZOFRAN ) 8 MG tablet Take 1 tablet (8 mg total) by mouth every 8 (eight) hours as needed for nausea or vomiting. Start on  third day after chemotherapy. 03/14/23  Yes Ennever, Sherryll Donald, MD  SEMGLEE , YFGN, 100 UNIT/ML Pen Inject 14 Units into the skin at bedtime. 12/27/22  Yes [provider]  Vitamin D , Ergocalciferol , (DRISDOL ) 1.25 MG (50000 UNIT) CAPS capsule TAKE 1 CAPSULE (50,000 UNITS TOTAL) BY MOUTH EVERY 7 (SEVEN) DAYS. 04/05/23  Yes Webb, Padonda B, FNP  zolpidem  (AMBIEN ) 5 MG tablet Take 1 tablet (5 mg total) by mouth at bedtime. 04/24/23  Yes Olalere, Adewale A, MD  glucose blood (ONETOUCH VERIO) test strip CHECK BLOOD SUGAR DAILY AS NEEDED 05/22/23   Neda Balk, MD  insulin  glargine (LANTUS ) 100 unit/mL SOPN Inject 14 Units into the skin at bedtime. 04/26/23: Reports during TOC 30-day program call: she is currently taking this insulin  14 U at bedtime: reports has not started Semglee  insulin  because she still had Lantus  at home to use- per PCP advice Patient not taking: Reported on 05/29/2023    Neda Balk, MD  Insulin  Pen Needle (BD PEN NEEDLE NANO 2ND GEN) 32G X 4 MM MISC USE AS DIRECTED WITH LANTUS  ONCE A DAY. 11/20/22   Neda Balk, MD  Lancets [facility] DELICA PLUS LANCET33G) MISC USE AS DIRECTED TO TEST DAILY 09/11/22   Neda Balk, MD  vancomycin  (VANCOCIN ) 125 MG capsule Take 125 mg by mouth 4 (four) times daily. Patient not taking: Reported on 05/28/2023 04/18/23   [provider]  glyBURIDE -metformin  (GLUCOVANCE ) 1.25-250 MG tablet Take 1 tablet by mouth 2 (two) times daily  with a meal. 03/25/19 03/25/19  Neda Balk, MD     Vital Signs: BP (!) 103/51 (BP Location: Left Arm)   Pulse (!) 105   Temp (!) 97.5 F (36.4 C) (Oral)   Resp 16   Ht 5\' 4"  (1.626 m)   Wt 197 lb 8 oz (89.6 kg)   SpO2 98%   BMI 33.90 kg/m   Physical Exam: Awake, alert; left lower abd/pelvic drain intact; dressing clean and dry, site not sig tender, yellow feculent appearing fluid in drain bag; drain flushed without difficulty   Imaging: CT ABDOMEN PELVIS WO CONTRAST Result Date:  06/14/2023 CLINICAL DATA:  Assess pelvic abscess and drain. EXAM: CT ABDOMEN AND PELVIS WITHOUT CONTRAST TECHNIQUE: Multidetector CT imaging of the abdomen and pelvis was performed following the standard protocol without IV contrast. RADIATION DOSE REDUCTION: This exam was performed according to the departmental dose-optimization program which includes automated exposure control, adjustment of the mA and/or kV according to patient size and/or use of iterative reconstruction technique. COMPARISON:  06/10/2023 FINDINGS: Lower chest: Postsurgical changes in left upper lung. Volume loss in the right lower lobe. No significant pleural effusions. Port catheter tip is near the superior cavoatrial junction. Hepatobiliary: Cholecystectomy. Liver contour is mildly nodular with slight enlargement of the left hepatic lobe. Findings are suggestive for cirrhosis. Again noted is a large amount of perihepatic ascites. Pancreas: Unremarkable. No pancreatic ductal dilatation or surrounding inflammatory changes. Spleen: Spleen is normal for size. Small amount of perisplenic ascites. Adrenals/Urinary Tract: Left nephrectomy. Question left adrenal gland. Normal appearance of the right adrenal gland. Focal cortical scarring in the lateral aspect of the right kidney and suspect previous surgery or ablation defect in this area. There may be residual contrast within the right renal cortex from the exam on 06/10/2023. No significant contrast in the collecting system. Negative for right hydronephrosis. Urinary bladder is decompressed with a Foley catheter. Stomach/Bowel: Scattered sigmoid diverticula. Normal appearance of the stomach. Mildly dilated loops of small bowel throughout the abdomen. Oral contrast has moved into the colon from the exam on 06/10/2023. There is retained oral contrast in the colon. Findings are suggestive for slow bowel motility. Vascular/Lymphatic: Again noted are enlarged venous structures near the gastric cardia  compatible with gastric varices. Atherosclerotic calcifications in the abdominal aorta without aneurysm. No significant lymph node enlargement in the abdomen or pelvis. Reproductive: Status post hysterectomy. No adnexal masses. Other: Moderate to large amount of ascites is similar to the prior examination. Stranding and fluid within the bowel mesentery. Again noted are multiple pockets of free air in the anterior abdomen. The amount of free air is similar to the exam on 06/10/2023. Again noted is a percutaneous drain in the anterior pelvis with the tip coiled next to the sigmoid colon. The pericolonic abscess appears to be decompressed. No significant abscess collection identified. Musculoskeletal: Stable sclerotic foci involving the pelvis. Again noted is a sclerotic focus involving the right sixth rib. Findings are compatible with sclerotic bone metastasis. Scattered areas of subcutaneous edema. IMPRESSION: 1. Pericolonic pelvic abscess appears to be decompressed with the percutaneous drain. No significant abscess collection identified but limited evaluation without intravascular contrast. 2. Persistent free air throughout the abdomen and similar to the exam on 06/10/2023. This could be related to the perforated diverticulitis and a persistent colonic fistula with the drain. 3. Cirrhosis with moderate to large amount of ascites. 4. Right kidney parenchyma remains dense and suspect there is retained contrast from the exam on 06/10/2023. Findings raise  concern for acute kidney injury. 5. Small bowel is mildly distended and evidence for slow bowel motility. Findings could be associated with an adynamic ileus. 6. Sclerotic bone foci compatible with metastatic disease. Electronically Signed   By: Elene Griffes M.D.   On: 06/14/2023 11:15   DG CHEST PORT 1 VIEW Result Date: 06/11/2023 CLINICAL DATA:  Hypoxia EXAM: PORTABLE CHEST 1 VIEW COMPARISON:  05/29/2023 FINDINGS: Cardiac shadow is stable. Postsurgical changes are  noted on the left. Right chest wall port is noted in satisfactory position. Mild bibasilar atelectasis is noted. IMPRESSION: Mild bibasilar atelectasis. Electronically Signed   By: Violeta Grey M.D.   On: 06/11/2023 21:05   CT ABDOMEN PELVIS W CONTRAST Result Date: 06/10/2023 CLINICAL DATA:  Colitis, immunotherapy related toxicity evaluate for interval change in degree of colitis, worsening leukocytosis and lack of improvement in abdominal pain EXAM: CT ABDOMEN AND PELVIS WITH CONTRAST TECHNIQUE: Multidetector CT imaging of the abdomen and pelvis was performed using the standard protocol following bolus administration of intravenous contrast. RADIATION DOSE REDUCTION: This exam was performed according to the departmental dose-optimization program which includes automated exposure control, adjustment of the mA and/or kV according to patient size and/or use of iterative reconstruction technique. CONTRAST:  80mL OMNIPAQUE  IOHEXOL  300 MG/ML  SOLN COMPARISON:  CT 06/05/2023 FINDINGS: Lower chest: Elevated right hemidiaphragm with adjacent compressive atelectasis/scarring in the right middle and lower lobes. Dependent atelectasis in the left lower lobe. The known right lower lobe pulmonary nodule is obscured by atelectasis on the current exam, series 4, image 26. Hepatobiliary: Cirrhotic liver morphology. No focal hepatic lesion. Clips in the gallbladder fossa postcholecystectomy. No biliary dilatation. Pancreas: Fatty atrophy. No disproportionate inflammation. Edema is likely related to ascites and unchanged. Spleen: No splenomegaly.  No focal abnormality. Adrenals/Urinary Tract: No adrenal nodule. Left nephrectomy with stable appearance of the nephrectomy bed. Stable chronic scarring in the mid lower right kidney. No right hydronephrosis. No right renal inflammation. Partially distended urinary bladder. There is air in the non dependent bladder. Stomach/Bowel: Administered enteric contrast reaches the descending  colon. Persistent distal descending and proximal sigmoid colonic wall thickening in the region of multiple diverticula. There is no progressive bowel inflammation from prior. Interval drainage of pericolonic abscess with a drainage catheter is adjacent to the colon. The heterogeneous collection is not definitively seen, there is currently minimally complex fluid adjacent to the drain on the current exam, measuring 4.4 x 2.7 cm, series 2, image 89. This previously measured 4.7 x 3.7 cm. The lateral aspect of this collection abuts the urinary bladder. There are no new sites of bowel inflammation or wall thickening. No small bowel wall thickening or inflammation. Unremarkable appearance of the stomach. There are perigastric and paraesophageal varices. Vascular/Lymphatic: Aortic atherosclerosis without aneurysm. The portal vein is patent. There are perigastric and paraesophageal varices as well as left upper quadrant varices. Prominent right epicardial nodes are unchanged. No definite enlarged abdominopelvic lymph nodes. Reproductive: Status post hysterectomy. No adnexal masses. Other: Moderate amount of free air is seen in the anterior omentum, for example series 2, image 63. Patchy locules of free air present extending from the subdiaphragmatic region to the pelvis. Moderate volume abdominopelvic ascites, mildly increased from prior exam, predominantly simple fluid. Musculoskeletal: Posterior spurring at L1-L2 causes narrowing of the spinal canal. Stable sclerotic lesion in the left iliac bone. Small sclerotic lesion in the right ischium is again seen. No new osseous abnormalities over the last 5 days. IMPRESSION: 1. Interval drainage of pericolonic abscess with  a drainage catheter adjacent to the colon. The heterogeneous collection is not definitively seen, there is currently a minimally complex fluid adjacent to the drain on the current exam, measuring 4.4 x 2.7 cm. This previously measured 4.7 x 3.7 cm. This may  represent a residual collection, less likely loculated ascites. 2. Moderate amount of free air in the anterior abdomen and pelvis. This was not seen on prior exam. This could be related to recent procedure and drain flushing, however difficult to exclude colonic perforation in this setting. Persistent distal descending and proximal sigmoid colonic wall thickening in the region of multiple diverticula, without progressive bowel inflammation. Continued clinical follow-up is needed. 3. Cirrhosis with sequela of portal hypertension including varices. Moderate volume abdominopelvic ascites, mildly increased from prior exam, predominantly simple fluid. 4. Additional stable chronic findings. Aortic Atherosclerosis (ICD10-I70.0). These results were called by telephone at the time of interpretation on 06/10/2023 at 6:05 pm to the patient's nurse, no physician was available at the time of the exam interpretation to receive results. Patient's nurse will get in touch with the doctor and convey these results. Electronically Signed   By: Chadwick Colonel M.D.   On: 06/10/2023 18:11    Labs:  CBC: Recent Labs    06/11/23 1127 06/12/23 0525 06/13/23 1445 06/14/23 0742  WBC 29.8* 28.6* 23.0* 16.6*  HGB 9.6* 9.7* 10.3* 8.3*  HCT 30.4* 30.0* 32.7* 26.1*  PLT 137* 122* 102* 53*    COAGS: Recent Labs    06/06/23 1000  INR 1.3*    BMP: Recent Labs    06/11/23 1127 06/12/23 0525 06/13/23 0500 06/14/23 0500  NA 135 133* 135 131*  K 4.5 4.6 3.6 4.3  CL 108 107 115* 107  CO2 19* 19* 13* 16*  GLUCOSE 108* 134* 137* 89  BUN 38* 44* 47* 60*  CALCIUM  9.8 9.9 8.1* 10.5*  CREATININE 1.31* 1.38* 1.36* 1.96*  GFRNONAA 41* 39* 39* 25*    LIVER FUNCTION TESTS: Recent Labs    06/11/23 1127 06/12/23 0525 06/13/23 0500 06/14/23 0500  BILITOT 0.5 0.8 0.7 1.7*  AST 31 36 30 38  ALT 22 24 22 21   ALKPHOS 236* 363* 331* 422*  PROT 4.5* 4.5* 3.6* 4.5*  ALBUMIN 1.9* 1.8* <1.5* 2.6*    Assessment and  Plan: Pt with hx lung cancer, diverticulitis with assoc abscess, c diff; s/p left lower abd/pelvic drain placement 5/8; afebrile, WBC 16.6 down from 23, hemoglobin 8.3 down from 10.3, platelets 53K down from 102K, potassium normal, creatinine 1.96 up from 1.36; drain fluid cultures with moderate Proteus and Bacteroides; latest CT abdomen pelvis performed today revealed:  1. Pericolonic pelvic abscess appears to be decompressed with the percutaneous drain. No significant abscess collection identified but limited evaluation without intravascular contrast. 2. Persistent free air throughout the abdomen and similar to the exam on 06/10/2023. This could be related to the perforated diverticulitis and a persistent colonic fistula with the drain. 3. Cirrhosis with moderate to large amount of ascites. 4. Right kidney parenchyma remains dense and suspect there is retained contrast from the exam on 06/10/2023. Findings raise concern for acute kidney injury. 5. Small bowel is mildly distended and evidence for slow bowel motility. Findings could be associated with an adynamic ileus. 6. Sclerotic bone foci compatible with metastatic disease  Continue current treatment, once drain output is minimal we will plan drain injection study        Electronically Signed: D. Honore Lux, PA-C 06/14/2023, 1:18 PM   I spent  a total of 15 Minutes at the the patient's bedside AND on the patient's hospital floor or unit, greater than 50% of which was counseling/coordinating care for pelvic abscess drain    Patient ID: Amanda Crawford, female   DOB: [birth year], 78 y.o.   MRN: [id number]

## 2023-06-14 NOTE — Plan of Care (Signed)
  Problem: Clinical Measurements: Goal: Diagnostic test results will improve Outcome: Progressing   Problem: Coping: Goal: Level of anxiety will decrease Outcome: Progressing   Problem: Safety: Goal: Ability to remain free from injury will improve Outcome: Progressing   

## 2023-06-14 NOTE — Progress Notes (Signed)
 Amanda Crawford   DOB:August 25, 1945   QM#:[id number]      ASSESSMENT & PLAN:  Amanda Crawford is an 78 year old female patient with a diagnosis of small cell lung cancer with pulmonary mets.  She has a history of RCC.  During this admission, she has been diagnosed with abdominal abscess for which a drain was placed.    Small cell lung cancer, extensive stage History of renal cell carcinoma - Cytology of lung nodule confirmed metastatic carcinoma consistent with small cell carcinoma.  - S/p chemotherapy, last received 05/21/2023. - Appreciate palliative team following - Medical oncology/Dr. Maria Shiner following and will make further treatment recommendations   Pericolonic abscess - Left lower quadrant,  -- CT guided drainage catheter done 5/8 by IR.   - Patient had repeat CT abdomen pelvis today, results pending - Surgical team following  Acute diarrhea Colitis with +C. diff - Patient presented with recurrence of diarrhea from diagnosis of C. difficile back in March, C. difficile confirmed 05/30/2023.  Rectal tube was placed.  ID following. - Continue meds as ordered   Severe thrombocytopenia - Platelets very low 27K on 5/5. - Platelets have improved 102K on 5/14. - Transfuse platelets for counts <20 K or <50 K with bleeding.  No bleeding noted, no transfusional interventions required at this time. - Sinew to monitor CBC with differential   Anemia - Hemoglobin stable 10.3 on 5/14 - Recommend PRBC transfusion for hemoglobin <7.5 - Continue to monitor CBC with differential   Leukocytosis - Remains elevated WBC 23.0.   -Of note she was neutropenic from chemo. Given G-CSF therapy.   -Continue to monitor CBC with differential   Hypertension--> now with hypotension Diabetes Hyperlipidemia Cirrhosis - Continue to monitor blood glucose levels. - Continue to monitor blood pressure. - Statin is on hold due to cirrhosis - Medical teams managing  Code Status DNR-limited  Subjective:   Patient seen laying in bed, ill-appearing, falls asleep while talking.  Family members at bedside report noted decline in the last few days.  Patient complains of abdominal pain and remains with 1 intact drain for abdominal abscess.  Very poor appetite.  Family reports she may be going for another drain today.  No other acute complaints offered.  Objective:   Intake/Output Summary (Last 24 hours) at 06/14/2023 0934 Last data filed at 06/14/2023 0400 Gross per 24 hour  Intake 817.82 ml  Output 125 ml  Net 692.82 ml     PHYSICAL EXAMINATION: ECOG PERFORMANCE STATUS: 3 - Symptomatic, >50% confined to bed  Vitals:   06/14/23 0052 06/14/23 0546  BP: (!) 102/54 (!) 103/51  Pulse: (!) 116 (!) 105  Resp: 20 16  Temp: 97.7 F (36.5 C) (!) 97.5 F (36.4 C)  SpO2: 98% 98%   Filed Weights   05/29/23 0304 05/29/23 1416  Weight: 205 lb 14.6 oz (93.4 kg) 197 lb 8 oz (89.6 kg)    GENERAL: + Somnolent + ill-appearing SKIN: + Pale skin color, texture, turgor are normal, no rashes or significant lesions EYES: normal, conjunctiva are pink and non-injected, sclera clear OROPHARYNX: no exudate, no erythema and lips, buccal mucosa, and tongue normal  NECK: supple, thyroid  normal size, non-tender, without nodularity LYMPH: no palpable lymphadenopathy in the cervical, axillary or inguinal LUNGS: clear to auscultation and percussion with normal breathing effort HEART: regular rate & rhythm and no murmurs and no lower extremity edema ABDOMEN: abdomen soft, non-tender and normal bowel sounds MUSCULOSKELETAL: no cyanosis of digits and no clubbing  PSYCH: +  Somnolent     All questions were answered. The patient knows to call the clinic with any problems, questions or concerns.   The total time spent in the appointment was 40 minutes encounter with patient including review of chart and various tests results, discussions about plan of care and coordination of care plan  [name], NP 06/14/2023  9:34 AM    Labs Reviewed:  Lab Results  Component Value Date   WBC 23.0 (H) 06/13/2023   HGB 10.3 (L) 06/13/2023   HCT 32.7 (L) 06/13/2023   MCV 104.8 (H) 06/13/2023   PLT 102 (L) 06/13/2023   Recent Labs    06/12/23 0525 06/13/23 0500 06/14/23 0500  NA 133* 135 131*  K 4.6 3.6 4.3  CL 107 115* 107  CO2 19* 13* 16*  GLUCOSE 134* 137* 89  BUN 44* 47* 60*  CREATININE 1.38* 1.36* 1.96*  CALCIUM  9.9 8.1* 10.5*  GFRNONAA 39* 39* 25*  PROT 4.5* 3.6* 4.5*  ALBUMIN 1.8* <1.5* 2.6*  AST 36 30 38  ALT 24 22 21   ALKPHOS 363* 331* 422*  BILITOT 0.8 0.7 1.7*    Studies Reviewed:  DG CHEST PORT 1 VIEW Result Date: 06/11/2023 CLINICAL DATA:  Hypoxia EXAM: PORTABLE CHEST 1 VIEW COMPARISON:  05/29/2023 FINDINGS: Cardiac shadow is stable. Postsurgical changes are noted on the left. Right chest wall port is noted in satisfactory position. Mild bibasilar atelectasis is noted. IMPRESSION: Mild bibasilar atelectasis. Electronically Signed   By: Violeta Grey M.D.   On: 06/11/2023 21:05   CT ABDOMEN PELVIS W CONTRAST Result Date: 06/10/2023 CLINICAL DATA:  Colitis, immunotherapy related toxicity evaluate for interval change in degree of colitis, worsening leukocytosis and lack of improvement in abdominal pain EXAM: CT ABDOMEN AND PELVIS WITH CONTRAST TECHNIQUE: Multidetector CT imaging of the abdomen and pelvis was performed using the standard protocol following bolus administration of intravenous contrast. RADIATION DOSE REDUCTION: This exam was performed according to the departmental dose-optimization program which includes automated exposure control, adjustment of the mA and/or kV according to patient size and/or use of iterative reconstruction technique. CONTRAST:  80mL OMNIPAQUE  IOHEXOL  300 MG/ML  SOLN COMPARISON:  CT 06/05/2023 FINDINGS: Lower chest: Elevated right hemidiaphragm with adjacent compressive atelectasis/scarring in the right middle and lower lobes. Dependent atelectasis in the left  lower lobe. The known right lower lobe pulmonary nodule is obscured by atelectasis on the current exam, series 4, image 26. Hepatobiliary: Cirrhotic liver morphology. No focal hepatic lesion. Clips in the gallbladder fossa postcholecystectomy. No biliary dilatation. Pancreas: Fatty atrophy. No disproportionate inflammation. Edema is likely related to ascites and unchanged. Spleen: No splenomegaly.  No focal abnormality. Adrenals/Urinary Tract: No adrenal nodule. Left nephrectomy with stable appearance of the nephrectomy bed. Stable chronic scarring in the mid lower right kidney. No right hydronephrosis. No right renal inflammation. Partially distended urinary bladder. There is air in the non dependent bladder. Stomach/Bowel: Administered enteric contrast reaches the descending colon. Persistent distal descending and proximal sigmoid colonic wall thickening in the region of multiple diverticula. There is no progressive bowel inflammation from prior. Interval drainage of pericolonic abscess with a drainage catheter is adjacent to the colon. The heterogeneous collection is not definitively seen, there is currently minimally complex fluid adjacent to the drain on the current exam, measuring 4.4 x 2.7 cm, series 2, image 89. This previously measured 4.7 x 3.7 cm. The lateral aspect of this collection abuts the urinary bladder. There are no new sites of bowel inflammation or wall thickening.  No small bowel wall thickening or inflammation. Unremarkable appearance of the stomach. There are perigastric and paraesophageal varices. Vascular/Lymphatic: Aortic atherosclerosis without aneurysm. The portal vein is patent. There are perigastric and paraesophageal varices as well as left upper quadrant varices. Prominent right epicardial nodes are unchanged. No definite enlarged abdominopelvic lymph nodes. Reproductive: Status post hysterectomy. No adnexal masses. Other: Moderate amount of free air is seen in the anterior omentum,  for example series 2, image 63. Patchy locules of free air present extending from the subdiaphragmatic region to the pelvis. Moderate volume abdominopelvic ascites, mildly increased from prior exam, predominantly simple fluid. Musculoskeletal: Posterior spurring at L1-L2 causes narrowing of the spinal canal. Stable sclerotic lesion in the left iliac bone. Small sclerotic lesion in the right ischium is again seen. No new osseous abnormalities over the last 5 days. IMPRESSION: 1. Interval drainage of pericolonic abscess with a drainage catheter adjacent to the colon. The heterogeneous collection is not definitively seen, there is currently a minimally complex fluid adjacent to the drain on the current exam, measuring 4.4 x 2.7 cm. This previously measured 4.7 x 3.7 cm. This may represent a residual collection, less likely loculated ascites. 2. Moderate amount of free air in the anterior abdomen and pelvis. This was not seen on prior exam. This could be related to recent procedure and drain flushing, however difficult to exclude colonic perforation in this setting. Persistent distal descending and proximal sigmoid colonic wall thickening in the region of multiple diverticula, without progressive bowel inflammation. Continued clinical follow-up is needed. 3. Cirrhosis with sequela of portal hypertension including varices. Moderate volume abdominopelvic ascites, mildly increased from prior exam, predominantly simple fluid. 4. Additional stable chronic findings. Aortic Atherosclerosis (ICD10-I70.0). These results were called by telephone at the time of interpretation on 06/10/2023 at 6:05 pm to the patient's nurse, no physician was available at the time of the exam interpretation to receive results. Patient's nurse will get in touch with the doctor and convey these results. Electronically Signed   By: Chadwick Colonel M.D.   On: 06/10/2023 18:11   CT GUIDED PERITONEAL/RETROPERITONEAL FLUID DRAIN BY PERC CATH Result  Date: 06/07/2023 INDICATION: Pelvic diverticular abscess EXAM: CT-guided drain placement TECHNIQUE: Multidetector CT imaging of the pelvis was performed following the standard protocol with/without IV contrast. RADIATION DOSE REDUCTION: This exam was performed according to the departmental dose-optimization program which includes automated exposure control, adjustment of the mA and/or kV according to patient size and/or use of iterative reconstruction technique. MEDICATIONS: The patient is currently admitted to the hospital and receiving intravenous antibiotics. The antibiotics were administered within an appropriate time frame prior to the initiation of the procedure. ANESTHESIA/SEDATION: Moderate (conscious) sedation was employed during this procedure. A total of Versed  1 mg and Fentanyl  50 mcg was administered intravenously by the radiology nurse. Total intra-service moderate Sedation Time: 50 minutes. The patient's level of consciousness and vital signs were monitored continuously by radiology nursing throughout the procedure under my direct supervision. COMPLICATIONS: None immediate. PROCEDURE: Informed written consent was obtained from the patient after a thorough discussion of the procedural risks, benefits and alternatives. All questions were addressed. Maximal Sterile Barrier Technique was utilized including caps, mask, sterile gowns, sterile gloves, sterile drape, hand hygiene and skin antiseptic. A timeout was performed prior to the initiation of the procedure. With the patient in a supine position, initial images of the pelvis were obtained. The abscess previously identified on the CT of the abdomen pelvis from Jun 05, 2023 was without significant change.  Radiopaque markers were placed on the patient's pelvis and repeat imaging was performed in order to demarcate the access site. Patient's skin was then marked, prepped, and draped in the usual sterile fashion. The skin and deeper tissue was anesthetized  with 1% lidocaine . Small incision was made the patient is scanned and the Yueh needle 15 cm was advanced sequentially into the sigmoid diverticular abscess. Once the needle tip was within the abscess, a guidewire was advanced and coiled within the abscess cavity. The access tract was then dilated with a 10 French fascial dilator. After the tract was dilated, a 10 French pigtail drainage catheter was advanced over the guidewire and coiled within the abscess. The guidewire was removed. Retention suture and sterile dressing were applied. The catheter was connected to a JP bulb and instructions on catheter flushing were placed in the patient's chart. IMPRESSION: Satisfactory CT-guided placement of a 10 French drainage catheter is sigmoid diverticular abscess. There appears to be a large communication between the diverticular abscess and the bowel. Electronically Signed   By: Susan Ensign   On: 06/07/2023 14:55   CT ABDOMEN PELVIS W CONTRAST Result Date: 06/05/2023 CLINICAL DATA:  Metastatic small cell lung cancer with recent perforated diverticulitis and small peridiverticular abscess. Diarrhea. EXAM: CT ABDOMEN AND PELVIS WITH CONTRAST TECHNIQUE: Multidetector CT imaging of the abdomen and pelvis was performed using the standard protocol following bolus administration of intravenous contrast. RADIATION DOSE REDUCTION: This exam was performed according to the departmental dose-optimization program which includes automated exposure control, adjustment of the mA and/or kV according to patient size and/or use of iterative reconstruction technique. CONTRAST:  OMNIPAQUE  IOHEXOL  300 MG/ML  SOLN COMPARISON:  Abdominopelvic CT 05/30/2023.  PET-CT 05/14/2023. FINDINGS: Lower chest: Similar appearance of residual treated nodule at the right lung base, measuring approximately 1.9 x 1.5 cm on image 24/4. There is atelectasis or scarring at both lung bases. No significant pleural or pericardial effusion. Central venous  catheter projects to the superior cavoatrial junction. There is aortic and coronary artery atherosclerosis. Distal esophageal wall thickening noted. Hepatobiliary: Chronic morphologic changes of cirrhosis. No suspicious focal liver lesion or biliary dilatation identified status post cholecystectomy. Pancreas: Unremarkable. No pancreatic ductal dilatation or surrounding inflammatory changes. Spleen: Normal in size without focal abnormality. Adrenals/Urinary Tract: Both adrenal glands appear normal. Previous left nephrectomy with stable appearance of the nephrectomy bed. The right kidney has a stable appearance with scarring in the lateral interpolar region. No evidence of urinary tract calculus or hydronephrosis. The urinary bladder is decompressed without apparent focal abnormality. Stomach/Bowel: There is enteric contrast within the distal small bowel and colon, extending to the level of the descending colon. The stomach appears unremarkable for its degree of distention. There is no significant small bowel distension, wall thickening or surrounding inflammation. Status post appendectomy. No acute findings are seen in the proximal colon. Diverticulosis and wall thickening of the descending colon again noted with an enlarging collection of gas and fluid inferior to the sigmoid colon, measuring 4.7 x 3.7 cm on image 90/2. This collection appears more organized than on the previous study with mild peripheral enhancement, suspicious for developing abscess. No contrast extravasation. A rectal tube is in place. Vascular/Lymphatic: There are no enlarged abdominal or pelvic lymph nodes. Aortic and branch vessel atherosclerosis without evidence of aneurysm or large vessel occlusion. The portal, superior mesenteric and splenic veins are patent. There are small gastric varices. Reproductive: Status post hysterectomy. No evidence of adnexal mass. Other: Interval development of a moderate  amount of ascites throughout the  peritoneal cavity. This fluid appears simple, measuring water  density. There are no dependent high density components, extravasated enteric contrast or other focal fluid collection. No pneumoperitoneum. Interval increased mesenteric and subcutaneous edema. No focal abdominal wall hernia. Musculoskeletal: Stable known sclerotic metastasis involving the left iliac bone. Possible small metastasis within the right ischium. No progressive osseous metastatic disease or acute osseous findings identified. Old fracture of the right 6th rib laterally. There is multilevel spondylosis with prominent posterior osteophytes at L1-2 and L3-4. IMPRESSION: 1. Enlarging collection of gas and fluid inferior to the sigmoid colon, suspicious for developing abscess. No evidence of contrast extravasation or pneumoperitoneum. 2. Interval development of a moderate amount of ascites throughout the peritoneal cavity. Although nonspecific, this may relate to the patient's cirrhosis and/or reactive ascites from the diverticulitis. No other focal fluid collections are identified. 3. Chronic morphologic changes of cirrhosis with small gastric varices. 4. Stable appearance of residual treated nodule at the right lung base. 5. Stable osseous metastatic disease. No progressive osseous metastatic disease identified. 6. Coronary and aortic Atherosclerosis (ICD10-I70.0). Electronically Signed   By: Elmon Hagedorn M.D.   On: 06/05/2023 15:55   CT ABDOMEN PELVIS W CONTRAST Result Date: 05/30/2023 CLINICAL DATA:  Abdominal pain, acute, nonlocalized. EXAM: CT ABDOMEN AND PELVIS WITH CONTRAST TECHNIQUE: Multidetector CT imaging of the abdomen and pelvis was performed using the standard protocol following bolus administration of intravenous contrast. RADIATION DOSE REDUCTION: This exam was performed according to the departmental dose-optimization program which includes automated exposure control, adjustment of the mA and/or kV according to patient size  and/or use of iterative reconstruction technique. CONTRAST:  OMNIPAQUE  IOHEXOL  300 MG/ML  SOLN COMPARISON:  CT of the abdomen and pelvis 04/08/2023. FINDINGS: Lower chest: Mild dependent atelectasis is present in the right lower lobe. The lungs are otherwise clear. The heart size is normal. Coronary artery calcifications are present. No significant pleural or pericardial effusion is present. Hepatobiliary: No focal liver abnormality is seen. Status post cholecystectomy. No biliary dilatation. Pancreas: Minimal inflammatory changes about the head and tail the pancreas are less prominent than on the prior exam. No discrete lesions are present. Spleen: Normal in size without focal abnormality. Adrenals/Urinary Tract: The adrenal glands are normal bilaterally. Mild right-sided hydronephrosis are present. Scarring at the lower pole of the right kidney is stable. The right ureter is dilated to the level of the right UVJ. No obstructing stone or mass lesion is evident. Stranding is noted about the distal ureter. Left nephrectomy is again noted. The urinary bladder is mildly distended. Stomach/Bowel: The stomach and duodenum are within normal limits. Small bowel is unremarkable. Terminal ileum is within normal limits. The appendix is absent. The ascending and transverse colon are within normal limits. The descending colon demonstrates some distal diverticular changes. Diffuse diverticular changes are present the sigmoid colon. Marked wall thickening is present proximally. A fluid collection adjacent to the inflamed sigmoid colon measures 4.4 x 5.6 x 3.5 cm. It abuts the left side of the urinary bladder. No free fluid is present. The distal sigmoid colon and rectum are within normal limits. Vascular/Lymphatic: Atherosclerotic calcifications are present in the aorta and branch vessels. No aneurysm is present. No significant adenopathy is present. Reproductive: Status post hysterectomy. No adnexal masses. Other: No  abdominal wall hernia or abnormality. No abdominopelvic ascites. Musculoskeletal: Multilevel degenerative changes are again noted in the lumbar spine. Vacuum disc is present at L1-2, L3-4, L4-5 and L5-S1. Right foraminal narrowing and bilateral  scratched at right foraminal stenosis is present at L4-5. Bilateral foraminal narrowing is present at L5-S1. IMPRESSION: 1. Sigmoid diverticulitis. 2. 4.4 x 5.6 x 3.5 cm fluid collection adjacent to the inflamed sigmoid colon compatible with abscess. 3. Mild right-sided hydronephrosis and hydroureter to the level of the right UVJ. No obstructing stone or mass lesion is evident. This may represent a recently passed stone. 4. Left nephrectomy. 5. Multilevel degenerative changes in the lumbar spine. 6. Coronary artery disease. Electronically Signed   By: Audree Leas M.D.   On: 05/30/2023 16:49   CT Angio Chest PE W and/or Wo Contrast Result Date: 05/29/2023 CLINICAL DATA:  Weakness and decreased appetite. Disorientation. Small-cell lung cancer. EXAM: CT ANGIOGRAPHY CHEST WITH CONTRAST TECHNIQUE: Multidetector CT imaging of the chest was performed using the standard protocol during bolus administration of intravenous contrast. Multiplanar CT image reconstructions and MIPs were obtained to evaluate the vascular anatomy. RADIATION DOSE REDUCTION: This exam was performed according to the departmental dose-optimization program which includes automated exposure control, adjustment of the mA and/or kV according to patient size and/or use of iterative reconstruction technique. CONTRAST:  60mL OMNIPAQUE  IOHEXOL  350 MG/ML SOLN COMPARISON:  Chest CT 04/08/2023 FINDINGS: Cardiovascular: The heart size is normal. No substantial pericardial effusion. Moderate atherosclerotic calcification is noted in the wall of the thoracic aorta. There is no filling defect within the opacified pulmonary arteries to suggest the presence of an acute pulmonary embolus. Mediastinum/Nodes: No  mediastinal lymphadenopathy. There is no hilar lymphadenopathy. The esophagus has normal imaging features. There is no axillary lymphadenopathy. Lungs/Pleura: Retro hilar right lower lobe pulmonary lesion measured previously at 2.7 x 2.3 cm is 2.6 x 1.5 cm on image 63/303. Postsurgical changes noted in the region of the left hilum. No pleural effusion. Upper Abdomen: Nodular liver contour suggests cirrhosis. Musculoskeletal: No worrisome lytic or sclerotic osseous abnormality. Review of the MIP images confirms the above findings. IMPRESSION: 1. No CT evidence for acute pulmonary embolus. 2. Retro hilar right lower lobe pulmonary lesion measured previously at 2.7 x 2.3 cm is 2.6 x 1.5 cm. 3. Nodular liver contour suggests cirrhosis. 4.  Aortic Atherosclerosis (ICD10-I70.0). Electronically Signed   By: Donnal Fusi M.D.   On: 05/29/2023 08:15   DG Chest 1 View Result Date: 05/29/2023 CLINICAL DATA:  Altered mental status EXAM: CHEST  1 VIEW COMPARISON:  04/08/2023 FINDINGS: Lungs are clear. No pneumothorax or pleural effusion. Right internal jugular chest port tip is seen at the superior cavoatrial junction. Cardiac size within limits. Pulmonary vascularity is normal. IMPRESSION: 1. No active disease. Electronically Signed   By: Worthy Heads M.D.   On: 05/29/2023 03:44   CT Head Wo Contrast Result Date: 05/29/2023 EXAM: CT HEAD WITHOUT 05/29/2023 03:29:37 AM TECHNIQUE: CT of the head was performed without the administration of intravenous contrast. Automated exposure control, iterative reconstruction, and/or weight based adjustment of the mA/kV was utilized to reduce the radiation dose to as low as reasonably achievable. COMPARISON: 04/08/2023 CLINICAL HISTORY: Mental status change, unknown cause. Aphasia. Patient currently on chemotherapy. History of diabetes, lung cancer, migraine, renal cell cancer, and renal insufficiency. FINDINGS: BRAIN AND VENTRICLES: There is no acute intracranial hemorrhage, mass  effect or midline shift. No abnormal extra-axial fluid collection. The gray-white differentiation is maintained without evidence of an acute infarct. There is no evidence of hydrocephalus. Mild scattered periventricular and subcortical low density, likely reflecting small vessel ischemic changes. ORBITS: The visualized portion of the orbits demonstrate no acute abnormality. SINUSES: The visualized paranasal sinuses  and mastoid air cells demonstrate no acute abnormality. SOFT TISSUES AND SKULL: No acute abnormality of the visualized skull or soft tissues. VASCULATURE: Intracranial atherosclerosis. IMPRESSION: 1. No acute intracranial abnormality. 2. Mild small vessel ischemic changes. Electronically signed by: Zadie Herter MD 05/29/2023 03:32 AM EDT RP Workstation: [id number]

## 2023-06-15 ENCOUNTER — Inpatient Hospital Stay (HOSPITAL_COMMUNITY)

## 2023-06-15 ENCOUNTER — Inpatient Hospital Stay

## 2023-06-15 DIAGNOSIS — C3490 Malignant neoplasm of unspecified part of unspecified bronchus or lung: Secondary | ICD-10-CM | POA: Diagnosis not present

## 2023-06-15 DIAGNOSIS — Z7189 Other specified counseling: Secondary | ICD-10-CM | POA: Diagnosis not present

## 2023-06-15 DIAGNOSIS — Z515 Encounter for palliative care: Secondary | ICD-10-CM | POA: Diagnosis not present

## 2023-06-15 DIAGNOSIS — C342 Malignant neoplasm of middle lobe, bronchus or lung: Secondary | ICD-10-CM | POA: Diagnosis not present

## 2023-06-15 DIAGNOSIS — A4151 Sepsis due to Escherichia coli [E. coli]: Secondary | ICD-10-CM | POA: Diagnosis not present

## 2023-06-15 LAB — GLUCOSE, CAPILLARY
Glucose-Capillary: 79 mg/dL (ref 70–99)
Glucose-Capillary: 60 mg/dL — ABNORMAL LOW (ref 70–99)
Glucose-Capillary: 115 mg/dL — ABNORMAL HIGH (ref 70–99)
Glucose-Capillary: 104 mg/dL — ABNORMAL HIGH (ref 70–99)
Glucose-Capillary: 109 mg/dL — ABNORMAL HIGH (ref 70–99)

## 2023-06-15 LAB — COMPREHENSIVE METABOLIC PANEL WITH GFR
ALT: 21 U/L (ref 0–44)
AST: 39 U/L (ref 15–41)
Albumin: 2.1 g/dL — ABNORMAL LOW (ref 3.5–5.0)
Alkaline Phosphatase: 515 U/L — ABNORMAL HIGH (ref 38–126)
Anion gap: 5 (ref 5–15)
BUN: 64 mg/dL — ABNORMAL HIGH (ref 8–23)
CO2: 17 mmol/L — ABNORMAL LOW (ref 22–32)
Calcium: 10.6 mg/dL — ABNORMAL HIGH (ref 8.9–10.3)
Chloride: 110 mmol/L (ref 98–111)
Creatinine, Ser: 2.06 mg/dL — ABNORMAL HIGH (ref 0.44–1.00)
GFR, Estimated: 24 mL/min — ABNORMAL LOW (ref >60)
Glucose, Bld: 69 mg/dL — ABNORMAL LOW (ref 70–99)
Potassium: 4.3 mmol/L (ref 3.5–5.1)
Sodium: 132 mmol/L — ABNORMAL LOW (ref 135–145)
Total Bilirubin: 1.9 mg/dL — ABNORMAL HIGH (ref 0.0–1.2)
Total Protein: 4.5 g/dL — ABNORMAL LOW (ref 6.5–8.1)

## 2023-06-15 LAB — CBC WITH DIFFERENTIAL/PLATELET
Abs Immature Granulocytes: 0.41 10*3/uL — ABNORMAL HIGH (ref 0.00–0.07)
Basophils Absolute: 0.1 10*3/uL (ref 0.0–0.1)
Basophils Relative: 0 %
Eosinophils Absolute: 0 10*3/uL (ref 0.0–0.5)
Eosinophils Relative: 0 %
HCT: 26.7 % — ABNORMAL LOW (ref 36.0–46.0)
Hemoglobin: 8.5 g/dL — ABNORMAL LOW (ref 12.0–15.0)
Immature Granulocytes: 3 %
Lymphocytes Relative: 6 %
Lymphs Abs: 1 10*3/uL (ref 0.7–4.0)
MCH: 33.7 pg (ref 26.0–34.0)
MCHC: 31.8 g/dL (ref 30.0–36.0)
MCV: 106 fL — ABNORMAL HIGH (ref 80.0–100.0)
Monocytes Absolute: 1 10*3/uL (ref 0.1–1.0)
Monocytes Relative: 6 %
Neutro Abs: 13.4 10*3/uL — ABNORMAL HIGH (ref 1.7–7.7)
Neutrophils Relative %: 85 %
Platelets: 49 10*3/uL — ABNORMAL LOW (ref 150–400)
RBC: 2.52 MIL/uL — ABNORMAL LOW (ref 3.87–5.11)
RDW: 21 % — ABNORMAL HIGH (ref 11.5–15.5)
WBC: 15.8 10*3/uL — ABNORMAL HIGH (ref 4.0–10.5)
nRBC: 0 % (ref 0.0–0.2)

## 2023-06-15 MED ORDER — HYDROMORPHONE HCL 1 MG/ML IJ SOLN
0.5000 mg | INTRAMUSCULAR | Status: DC | PRN
  Administered 2023-06-15 – 2023-06-17 (×7): 0.5 mg via INTRAVENOUS
  Filled 2023-06-15 (×7): qty 0.5

## 2023-06-15 MED ORDER — DEXTROSE 50 % IV SOLN
12.5000 g | INTRAVENOUS | Status: AC
  Administered 2023-06-15: 12.5 g via INTRAVENOUS
  Filled 2023-06-15: qty 50

## 2023-06-15 MED ORDER — VANCOMYCIN HCL 125 MG PO CAPS: 125.0000 mg | ORAL_CAPSULE | Freq: Every day | ORAL | Status: DC

## 2023-06-15 MED ORDER — HYDROMORPHONE HCL 1 MG/ML IJ SOLN
0.5000 mg | Freq: Once | INTRAMUSCULAR | Status: AC
  Administered 2023-06-15: 0.5 mg via INTRAVENOUS
  Filled 2023-06-15: qty 0.5

## 2023-06-15 MED ORDER — SODIUM CHLORIDE 0.9 % IV SOLN
500.0000 mg | INTRAVENOUS | Status: DC
Start: 2023-06-16 — End: 2023-06-17
  Administered 2023-06-16: 500 mg via INTRAVENOUS
  Filled 2023-06-15 (×2): qty 500

## 2023-06-15 MED ORDER — SODIUM CHLORIDE 0.9 % IV SOLN
60.0000 mg | Freq: Once | INTRAVENOUS | Status: AC
  Administered 2023-06-15: 60 mg via INTRAVENOUS
  Filled 2023-06-15: qty 10

## 2023-06-15 MED ORDER — LIDOCAINE HCL 1 % IJ SOLN
INTRAMUSCULAR | Status: AC
  Filled 2023-06-15: qty 20

## 2023-06-15 MED ORDER — CALCITONIN (SALMON) 200 UNIT/ML IJ SOLN
4.0000 [IU]/kg | Freq: Two times a day (BID) | INTRAMUSCULAR | Status: AC
Start: 2023-06-15 — End: 2023-06-16
  Administered 2023-06-15 – 2023-06-16 (×4): 358 [IU] via SUBCUTANEOUS
  Filled 2023-06-15 (×4): qty 1.79

## 2023-06-15 MED ORDER — ENSURE ENLIVE PO LIQD: 237.0000 mL | Freq: Two times a day (BID) | ORAL | Status: DC

## 2023-06-15 MED ORDER — DEXTROSE-SODIUM CHLORIDE 5-0.9 % IV SOLN: INTRAVENOUS | Status: AC

## 2023-06-15 MED ORDER — INSULIN GLARGINE-YFGN 100 UNIT/ML ~~LOC~~ SOLN
7.0000 [IU] | Freq: Every day | SUBCUTANEOUS | Status: DC
  Filled 2023-06-15: qty 0.07

## 2023-06-15 NOTE — Progress Notes (Signed)
 PROGRESS NOTE  Amanda Crawford  DOB: 07-21-1945  PCP: Neda Balk, MD ZOX:[id number]  DOA: 05/29/2023  LOS: 17 days  Hospital Day: 18  Brief narrative: Amanda Crawford is a 78 y.o. female with PMH significant for small cell lung cancer, prior renal cell cancer with lung mets, DM2, CKD, recently hospitalized in March for septic shock diverticulitis and C. difficile infection, discharged on oral Augmentin  and oral vancomycin . With the completion of antibiotics, Amanda Crawford's symptoms improved but few days ago, started again 4/29, Amanda Crawford presented to the ED with complaint of persistent watery diarrhea weakness, poor oral intake, confusion.  Initial labs with WBC count significantly low at 0.4, platelet 48,  Blood culture was sent Started on broad-spectrum antibiotics for neutropenia  Admitted to [facility] Blood culture sent on admission is growing E. coli 4/30, C. difficile PCR positive 4/30, CT abdomen and pelvis showed sigmoid diverticulitis, 3.5 X 4.4 X 5.6 cm abscess. Consultations obtained from oncology, ID, general surgery and IR.  Amanda Crawford's overall clinical status has been worsening. In the last several days, her appetite has been low, her albumin level is very low.  She has had progressive anasarca, low blood pressure, decline in renal function and lactic acidosis.  Subjective: Amanda Crawford was seen and examined this morning. Very somnolent.  Tries to open eyes on sternal rub.  Daughter and husband at bedside.  We had a long conversation about Amanda Crawford's gradual decline and critical status.  At this point after more than 2 weeks of her hospitalization, I think she is rapidly deteriorating and is highly unlikely to improve. Labs from this morning with creatinine worsening, glucose level low  Assessment and plan: Sepsis POA Sigmoid diverticulitis and abscess E. coli bacteremia Immunocompromise status Presented with abdominal pain, diarrhea.   Imaging showed sigmoid diverticulitis with  abscess  Per IR, abscess wass not amenable to percutaneous drainage. Per ID recommendation, Amanda Crawford was tried on IV Rocephin , and IV Flagyl . 5/6, repeat CT abdomen showed enlarging abscess for which, she underwent CT-guided drain placement on 5/8. 5/11, repeat CT abdomen showed a residual collection. 5/15, CT abdomen was repeated and followed by IR.  Abscess size seem to be improving but continues to have free air throughout the abdomen related to perforated diverticulitis.  Also has moderate to large amount of ascites related to liver cirrhosis.  Also suspected adynamic ileus. Currently allowed for soft diet but Amanda Crawford has really no appetite. Per IR, once drain output is minimal, noted a plan to do drain injection study. Per ID, Amanda Crawford on ertapenem now  Hypotension AKI on CKD 3B Acute metabolic acidosis Lactic acidosis Over the last several days, Amanda Crawford's appetite has been down, her albumin level has been low, she has had progressive anasarca.  In the last 24 hours, she has progressive decline in blood pressure, rising creatinine. Amanda Crawford was given 1 dose of IV albumin on 5/14.  Currently on IV hydration but creatinine continues to worsen. Also on sodium bicarb 1300 mg 3 times daily. Recent Labs    06/06/23 0542 06/07/23 0525 06/08/23 0540 06/09/23 0525 06/10/23 1403 06/11/23 1127 06/12/23 0525 06/13/23 0500 06/14/23 0500 06/15/23 0506  BUN 22 25* 21 20 29* 38* 44* 47* 60* 64*  CREATININE 0.84 0.85 0.89 0.78 1.41* 1.31* 1.38* 1.36* 1.96* 2.06*  CO2 18* 20* 21* 24 19* 19* 19* 13* 16* 17*    Recent Labs  Lab 06/14/23 0129  LATICACIDVEN 2.0*    Acute diarrhea  C. difficile colitis Amanda Crawford had recently completed treatment for  C. difficile in March with oral vancomycin  Presented this time with recurrence of diarrhea, poor oral intake 4/30, C. difficile PCR and antigen positive. Per ID recommendation, Amanda Crawford was started on Dificid  and completed the course on 5/11.  Currently  on prophylactic vancomycin . Last bowel movement 2 days ago.  Acute on chronic anemia  Amanda Crawford has chronic macrocytic anemia related to cancer  Hemoglobin improved after 1 unit PRBC.  Currently stable close to 9.  No active bleeding. Continue iron supplement Was also on aspirin , currently on hold Recent Labs    09/07/22 1110 10/12/22 1036 02/08/23 1110 03/14/23 1116 06/11/23 1127 06/12/23 0525 06/13/23 1445 06/14/23 0742 06/15/23 0506  HGB  --    < > 13.0   < > 9.6* 9.7* 10.3* 8.3* 8.5*  MCV  --    < > 101.7*   < > 105.2* 104.9* 104.8* 105.7* 106.0*  VITAMINB12 612  --  784  --   --   --   --   --   --    < > = values in this interval not displayed.    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 Maria Shiner as an outpatient.   Neutropenia due to chemotherapy.  Received Neupogen  at one point. Neutropenic precautions.   Recent Labs  Lab 06/11/23 1127 06/12/23 0525 06/13/23 1445 06/14/23 0129 06/14/23 0742 06/15/23 0506  WBC 29.8* 28.6* 23.0*  --  16.6* 15.8*  LATICACIDVEN  --   --   --  2.0*  --   --    Severe thrombocytopenia 2 units of platelet transfused so far.  Platelet low but thankfully no bleeding. Continue SCDs for DVT prophylaxis.   Recent Labs  Lab 06/09/23 0525 06/10/23 0440 06/11/23 1127 06/12/23 0525 06/13/23 1445 06/14/23 0742 06/15/23 0506  PLT 99* 136* 137* 122* 102* 53* 49*    Extensive stage small cell lung cancer on chemotherapy Under the care of Dr. Maria Shiner Per oncology note, she had 3 cycles of chemotherapy, last on 05/21/2023.    Type 2 diabetes mellitus Hypoglycemia A1c 7.6 on 02/08/2023 PTA meds - Semglee  nightly. No longer on glimepiride  because of hypoglycemia episodes recently Currently on Semglee  7 units nightly.  With low appetite and low blood sugar, I would completely stop Semglee .  Continue SSI Accu-Cheks Switched IV fluids from NS to D5 NS Recent Labs  Lab 06/14/23 1600 06/14/23 2049 06/15/23 0734  06/15/23 1208 06/15/23 1247  GLUCAP 81 80 79 60* 115*   H/o hypertension PTA meds- Coreg  half tab of 3.125 mg twice daily Coreg  currently on hold due to low blood pressure, poor oral intake and diarrhea.  Liver cirrhosis Moderate to severe ascites new finding on CT scan during her last hospital stay, Subsequent CT scans have also shown moderate to severe ascites. 3.2 L of hazy yellow fluid removed from paracentesis today 5/16   Hyperlipidemia statin on hold due to liver cirrhosis.  Recurrent acute urinary retention Failed voiding trial on 5/10.  Foley catheter reinserted  Currently has dark color urine in Foley  Severe hypoalbuminemia Albumin level less than 1.5 due to poor nutrition.   Has widespread anasarca. Given 1 dose of IV albumin last night   Mobility: Impaired mobility due to chemo.  Continue to work with PT  Goals of care   Code Status: Limited: Do not attempt resuscitation (DNR) -DNR-LIMITED -Do Not Intubate/DNI   Palliative care consult appreciated.  To monitor Amanda Crawford's status over the weekend.  Likely heading  to comfort care    DVT prophylaxis:  Place and maintain sequential compression device Start: 05/31/23 0835   Antimicrobials: IV cefazolin , IV Flagyl , and oral vancomycin  Fluid: On D5 NS Consultants: Oncology, ID, general surgery Family Communication: Daughter and husband at bedside  Status: Inpatient Level of care:  Telemetry Medical   Amanda Crawford is from: Home Needs to continue in-hospital care: Ongoing workup for diarrhea, diverticulitis, abscess Anticipated d/c to: Pending clinical course.  Prognosis not looking good.   Diet:  Diet Order             DIET SOFT Room service appropriate? Yes; Fluid consistency: Thin  Diet effective now                   Scheduled Meds:  calcitonin  4 Units/kg Subcutaneous BID   Chlorhexidine  Gluconate Cloth  6 each Topical Daily   insulin  aspart  0-15 Units Subcutaneous TID WC   insulin  aspart  0-5  Units Subcutaneous QHS   sodium bicarbonate   1,300 mg Oral TID   sodium chloride  flush  10-40 mL Intracatheter Q12H   sodium chloride  flush  5 mL Intracatheter Q8H   vancomycin   125 mg Oral BID   Zinc  Oxide   Topical BID    PRN meds: albuterol , HYDROmorphone  (DILAUDID ) injection, [DISCONTINUED] ondansetron  **OR** ondansetron  (ZOFRAN ) IV, mouth rinse, sodium chloride  flush   Infusions:   dextrose  5 % and 0.9 % NaCl 75 mL/hr at 06/15/23 1311   [START ON 06/16/2023] ertapenem     metronidazole  500 mg (06/15/23 1312)    Antimicrobials: Anti-infectives (From admission, onward)    Start     Dose/Rate Route Frequency Ordered Stop   06/16/23 0800  ertapenem (INVANZ) 500 mg in sodium chloride  0.9 % 50 mL IVPB        500 mg 100 mL/hr over 30 Minutes Intravenous Every 24 hours 06/15/23 1153     06/15/23 1000  ceFEPIme  (MAXIPIME ) 2 g in sodium chloride  0.9 % 100 mL IVPB  Status:  Discontinued        2 g 200 mL/hr over 30 Minutes Intravenous Every 24 hours 06/14/23 1128 06/15/23 1153   06/12/23 1130  ceFEPIme  (MAXIPIME ) 2 g in sodium chloride  0.9 % 100 mL IVPB  Status:  Discontinued        2 g 200 mL/hr over 30 Minutes Intravenous Every 12 hours 06/12/23 1044 06/14/23 1128   06/11/23 1100  vancomycin  (VANCOCIN ) capsule 125 mg        125 mg Oral 2 times daily 06/11/23 1006     06/10/23 2200  metroNIDAZOLE  (FLAGYL ) IVPB 500 mg        500 mg 100 mL/hr over 60 Minutes Intravenous Every 8 hours 06/10/23 2103 06/15/23 2359   06/06/23 2200  metroNIDAZOLE  (FLAGYL ) tablet 500 mg  Status:  Discontinued        500 mg Oral Every 12 hours 06/06/23 0951 06/10/23 2103   05/31/23 1600  vancomycin  (VANCOREADY) IVPB 1250 mg/250 mL  Status:  Discontinued        1,250 mg 166.7 mL/hr over 90 Minutes Intravenous Every 48 hours 05/29/23 1437 05/30/23 1416   05/31/23 1445  fidaxomicin  (DIFICID ) tablet 200 mg        200 mg Oral 2 times daily 05/31/23 1353 06/10/23 0959   05/31/23 1345  vancomycin  (VANCOCIN )  capsule 125 mg  Status:  Discontinued        125 mg Oral Daily 05/31/23 1253 05/31/23 1353   05/31/23 1000  fidaxomicin  (DIFICID ) tablet 200 mg  Status:  Discontinued        200 mg Oral 2 times daily 05/31/23 0812 05/31/23 0813   05/31/23 1000  metroNIDAZOLE  (FLAGYL ) IVPB 500 mg  Status:  Discontinued        500 mg 100 mL/hr over 60 Minutes Intravenous Every 12 hours 05/31/23 0813 06/06/23 0951   05/29/23 2300  cefTRIAXone  (ROCEPHIN ) 2 g in sodium chloride  0.9 % 100 mL IVPB  Status:  Discontinued        2 g 200 mL/hr over 30 Minutes Intravenous Every 24 hours 05/29/23 2211 06/12/23 1044   05/29/23 1700  ceFEPIme  (MAXIPIME ) 2 g in sodium chloride  0.9 % 100 mL IVPB  Status:  Discontinued        2 g 200 mL/hr over 30 Minutes Intravenous Every 12 hours 05/29/23 1437 05/29/23 2211   05/29/23 1600  metroNIDAZOLE  (FLAGYL ) IVPB 500 mg  Status:  Discontinued        500 mg 100 mL/hr over 60 Minutes Intravenous Every 12 hours 05/29/23 1511 05/31/23 0812   05/29/23 1500  vancomycin  (VANCOREADY) IVPB 2000 mg/400 mL        2,000 mg 200 mL/hr over 120 Minutes Intravenous  Once 05/29/23 1437 05/29/23 1724   05/29/23 0430  ceFEPIme  (MAXIPIME ) 2 g in sodium chloride  0.9 % 100 mL IVPB        2 g 200 mL/hr over 30 Minutes Intravenous  Once 05/29/23 0416 05/29/23 0523       Objective: Vitals:   06/15/23 1303 06/15/23 1351  BP: (!) 120/53   Pulse: (!) 106   Resp: 20   Temp: (!) 97.4 F (36.3 C)   SpO2: 98% 98%    Intake/Output Summary (Last 24 hours) at 06/15/2023 1444 Last data filed at 06/15/2023 0700 Gross per 24 hour  Intake 253.25 ml  Output 350 ml  Net -96.75 ml   Filed Weights   05/29/23 0304 05/29/23 1416  Weight: 93.4 kg 89.6 kg   Weight change:  Body mass index is 33.9 kg/m.   Physical Exam: General exam: Very pleasant, elderly Caucasian female.  Not in pain.   Skin: No rashes, lesions or ulcers. HEENT: Atraumatic, normocephalic, no obvious bleeding Lungs: Clear to  auscultation bilaterally on low-flow oxygen. CVS: S1, S2, no murmur,   GI/Abd: Soft, continues to have tenderness in lower abdomen, nondistended, bowel sound present,   CNS: Somnolent, barely able to open eyes on sternal rub on my eval this morning Extremities: 2+ bilateral pedal edema, no calf tenderness  Data Review: I have personally reviewed the laboratory data and studies available.  F/u labs ordered Unresulted Labs (From admission, onward)     Start     Ordered   06/16/23 0500  Prealbumin  Tomorrow morning,   R       Question:  Specimen collection method  Answer:  Unit=Unit collect   06/15/23 0717   06/15/23 1132  Aerobic/Anaerobic Culture w Gram Stain (surgical/deep wound)  RELEASE UPON ORDERING,   TIMED        06/15/23 1132   06/11/23 0640  CBC with Differential/Platelet  Daily,   R     Question:  Specimen collection method  Answer:  Unit=Unit collect   06/11/23 8119   06/02/23 0741  Prepare platelet pheresis  (Blood Administration Adult)  Once,   R       Question Answer Comment  Number of Apheresis Units (1 unit of apheresis platelets will increase platelets 30,000/mL  in an avg sized adult) 1 unit   Transfusion Indications Plt = 10,000   Date/Time blood product needed For transfusion   If emergent release call blood bank Not emergent release      06/02/23 0741            Signed, Hoyt Macleod, MD [facility] 06/15/2023

## 2023-06-15 NOTE — Plan of Care (Signed)
  Problem: Clinical Measurements: Goal: Will remain free from infection Outcome: Progressing Goal: Cardiovascular complication will be avoided Outcome: Progressing   Problem: Coping: Goal: Level of anxiety will decrease Outcome: Progressing   Problem: Elimination: Goal: Will not experience complications related to urinary retention Outcome: Progressing   Problem: Pain Managment: Goal: General experience of comfort will improve and/or be controlled Outcome: Progressing   Problem: Education: Goal: Knowledge of General Education information will improve Description: Including pain rating scale, medication(s)/side effects and non-pharmacologic comfort measures Outcome: Not Progressing

## 2023-06-15 NOTE — Progress Notes (Signed)
 Drain assessed during paracentesis. Drain intact, site clean. Small output, still feculent appearing. Flushed easily with ~6 mL sterile saline.   3.2 L hazy yellow fluid removed from paracentesis, see separate report.  Prudence Brown PA-C Interventional Radiology 06/15/2023 11:52 AM

## 2023-06-15 NOTE — Progress Notes (Signed)
 Hypoglycemic Event  CBG: 60  Treatment: D50 25 mL (12.5 gm)  Symptoms: None  Follow-up CBG: Time:1240 CBG Result: 115  Possible Reasons for Event: Inadequate meal intake  Imane Burrough L Alexia Angelucci

## 2023-06-15 NOTE — Progress Notes (Signed)
   06/15/23 0851  PT Visit Information  Last PT Received On 06/15/23  Reason Eval/Treat Not Completed Pain limiting ability to participate (pt sleeping upon PT entry. Daughter request hold PT for now due to pt with pain every few hours and just receving pain medications.)   PT will follow up as schedule allows. Will continue to follow for goals of care.

## 2023-06-15 NOTE — Progress Notes (Addendum)
 \        [facility]  Date of Admission:  05/29/2023   Total days of inpatient antibiotics 13  Principal Problem:   Sepsis (HCC) Active Problems:   Neutropenia (HCC)   C. difficile colitis   Abscess of sigmoid colon   Counseling and coordination of care   Enteritis due to Clostridium difficile   DNR (do not resuscitate) discussion   Goals of care, counseling/discussion   Palliative care encounter          Assessment: 78 year old female with: #C. difficile colitis #Port - Status post Dificid  x 10, on vancomycin  PPx #diverticulitis complicated intra-abdominal abscess post drain placement #E. coli bacteremia #Neutropenic fever - Initial plan was to treat with ceftriaxone  x 2 weeks then transition to Augmentin  for about 4 more weeks. - Repeat CT showed hydrogenous collection of minimally complex fluid adjacent to the drain measuring 4.4X 2.770 m previously measured 4.7X 3.7 cm.  Moderate amount of free air in anterior abdomen pelvis. - Per surgery, no plans for 2nd drain at this time. Surgery signed off.  -Cx form 5/8 + proteus hauseri and bacteroides distasonis - CT on 5/15 showed pericolonic pelvic abscess which appears to be decompressed.  Persistent free air in abdomen similar to 5/11 exam, could be peripheral diverticulitis with colonic fistula.  Cirrhosis with moderate large amount of ascites. - Seen by IR on 5/15 noted that once drain output is minimal plan on drainage extra study.  Paracentesis today. Recommendations: -change to   vancomycin  125mg  po qd for cdiff secondary ppx as long as pt on abx and 7 days from stopping abx -Switch cefepime  and metro to ertapenem. Plan on 4 weeks of IV abx from drain placement. Starting cfepime - Communicated to primary -F/U with ID on 6/9  OPAT ORDERS:  Diagnosis: intraabdominal infection  Allergies  Allergen Reactions   Culturelle Probiotics [Lactobacillus]     Patient should not take probiotics  while immunocompromised from chemo    Bactrim  Ds [Sulfamethoxazole -Trimethoprim ] Rash     Discharge antibiotics to be given via PICC line:  Per pharmacy protocol ertapenem 1gm q24h   End Date: 6/9  [facility] Care Per Protocol with [employer] Use: Home health RN for IV administration and teaching, line care and labs.    Labs weekly while on IV antibiotics: __x CBC with differential __ BMP **TWICE WEEKLY ON VANCOMYCIN   x__ CMP __ CRP __ ESR __ Vancomycin  trough TWICE WEEKLY __ CK  __ Please pull PIC at completion of IV antibiotics __ Please leave PIC in place until doctor has seen patient or been notified  Fax weekly labs to [phone]  Clinic Follow Up Appt: 6/4  @ [facility] with Dr. Zelda Hickman  Evaluation of this patient requires complex antimicrobial therapy evaluation and counseling + isolation needs for disease transmission risk assessment and mitigation     Microbiology:   Antibiotics: Cefadroxil - 5/1 - 5/10 Ceftriaxone /20-5/12 Cefepime  5/13-present Metronidazole /29-present Vancomycin  prophylaxis 5/12-present Cultures: Blood /29 2/2 E. coli 5/1 no growth   5/8 moderate GNR SUBJECTIVE: Sitting in bed.  Family at bedside. Interval: Afebrile overnight.  WBC 15k  Review of Systems: Review of Systems  All other systems reviewed and are negative.    Scheduled Meds:  calcitonin  4 Units/kg Subcutaneous BID   Chlorhexidine  Gluconate Cloth  6 each Topical Daily   insulin  aspart  0-15 Units Subcutaneous TID WC   insulin  aspart  0-5 Units Subcutaneous QHS   insulin  glargine-yfgn  7 Units  Subcutaneous QHS   sodium bicarbonate   1,300 mg Oral TID   sodium chloride  flush  10-40 mL Intracatheter Q12H   sodium chloride  flush  5 mL Intracatheter Q8H   vancomycin   125 mg Oral BID   Zinc  Oxide   Topical BID   Continuous Infusions:  sodium chloride  100 mL/hr at 06/15/23 0352   ceFEPime  (MAXIPIME ) IV 2 g (06/15/23 0833)   metronidazole  500 mg (06/15/23 0515)    pamidronate 60 mg (06/15/23 1001)   PRN Meds:.albuterol , HYDROmorphone  (DILAUDID ) injection, [DISCONTINUED] ondansetron  **OR** ondansetron  (ZOFRAN ) IV, mouth rinse, sodium chloride  flush Allergies  Allergen Reactions   Culturelle Probiotics [Lactobacillus]     Patient should not take probiotics while immunocompromised from chemo    Bactrim  Ds [Sulfamethoxazole -Trimethoprim ] Rash    OBJECTIVE: Vitals:   06/15/23 0835 06/15/23 1113 06/15/23 1116 06/15/23 1119  BP:  (!) 103/46 (!) 106/42 99/86  Pulse:      Resp:      Temp: (!) 96.9 F (36.1 C)     TempSrc: Axillary     SpO2:      Weight:      Height:       Body mass index is 33.9 kg/m.  Physical Exam Constitutional:      Appearance: Normal appearance.  HENT:     Head: Normocephalic and atraumatic.     Right Ear: Tympanic membrane normal.     Left Ear: Tympanic membrane normal.     Nose: Nose normal.     Mouth/Throat:     Mouth: Mucous membranes are moist.  Eyes:     Extraocular Movements: Extraocular movements intact.     Conjunctiva/sclera: Conjunctivae normal.     Pupils: Pupils are equal, round, and reactive to light.  Cardiovascular:     Rate and Rhythm: Normal rate and regular rhythm.     Heart sounds: No murmur heard.    No friction rub. No gallop.  Pulmonary:     Effort: Pulmonary effort is normal.     Breath sounds: Normal breath sounds.  Abdominal:     Comments: drain  Musculoskeletal:        General: Normal range of motion.  Skin:    General: Skin is warm and dry.  Neurological:     General: No focal deficit present.     Mental Status: She is alert and oriented to person, place, and time.  Psychiatric:        Mood and Affect: Mood normal.       Lab Results Lab Results  Component Value Date   WBC 15.8 (H) 06/15/2023   HGB 8.5 (L) 06/15/2023   HCT 26.7 (L) 06/15/2023   MCV 106.0 (H) 06/15/2023   PLT 49 (L) 06/15/2023    Lab Results  Component Value Date   CREATININE 2.06 (H) 06/15/2023    BUN 64 (H) 06/15/2023   NA 132 (L) 06/15/2023   K 4.3 06/15/2023   CL 110 06/15/2023   CO2 17 (L) 06/15/2023    Lab Results  Component Value Date   ALT 21 06/15/2023   AST 39 06/15/2023   ALKPHOS 515 (H) 06/15/2023   BILITOT 1.9 (H) 06/15/2023        Orlie Bjornstad, MD [facility] 06/15/2023, 11:25 AM

## 2023-06-15 NOTE — Progress Notes (Signed)
 Pharmacy Antibiotic Note  Amanda Crawford is a 78 y.o. female admitted on 05/29/2023 with intra-abdominal abscess  Pharmacy has been consulted to transition Cefepime  to Ertapenem.  Noted SCr up to 2.06, CrCl~20-25 ml/min - requiring a dose reduction of Ertapenem.  Plan: - D/c Cefepime  - Start Ertapenem 500 mg IV every 24 hours - Will continue to follow renal function, culture results, LOT, and antibiotic de-escalation plans   Height: 5\' 4"  (162.6 cm) Weight: 89.6 kg (197 lb 8 oz) IBW/kg (Calculated) : 54.7  Temp (24hrs), Avg:97.2 F (36.2 C), Min:94.9 F (34.9 C), Max:98 F (36.7 C)  Recent Labs  Lab 06/11/23 1127 06/12/23 0525 06/13/23 0500 06/13/23 1445 06/14/23 0129 06/14/23 0500 06/14/23 0742 06/15/23 0506  WBC 29.8* 28.6*  --  23.0*  --   --  16.6* 15.8*  CREATININE 1.31* 1.38* 1.36*  --   --  1.96*  --  2.06*  LATICACIDVEN  --   --   --   --  2.0*  --   --   --     Estimated Creatinine Clearance: 23.6 mL/min (A) (by C-G formula based on SCr of 2.06 mg/dL (H)).    Allergies  Allergen Reactions   Culturelle Probiotics [Lactobacillus]     Patient should not take probiotics while immunocompromised from chemo    Bactrim  Ds [Sulfamethoxazole -Trimethoprim ] Rash    Antimicrobials this admission: Rocephin  4/29 >> 5/12 Flagyl  4/29 >> 5/16 Dificid  5/1 >> 5/10 Cefepime  5/13 >> 5/16 Ertapenem 5/17 >>  Dose adjustments this admission:   Microbiology results: 4/29 BCx >> 3/4 E.coli 4/30 CDiff >> antigen/PCR+, toxin - 5/1 BCx >> ngf 5/8 abdominal abscess >> proteus + bacteroides 5/16 peritoneal fluid >>  Thank you for allowing pharmacy to be a part of this patient's care.  Garland Junk, PharmD, BCPS, BCIDP Infectious Diseases Clinical Pharmacist 06/15/2023 11:59 AM   **Pharmacist phone directory can now be found on [email or web address] ([email or web address]).  Listed under [facility].

## 2023-06-15 NOTE — Progress Notes (Signed)
 Unfortunately, Ms. Waldner just is not improving.  She has a lot of ascites on her CT scan that was done yesterday.  This needs to be drained.  She is also developing some hypercalcemia.  With the low albumin and calcium , her calcium  corrects to the high side.  We will give her some Aredia.  I may want to give her some calcitonin.  She is having more abdominal pain.  Some of this may be from the ascites.  We really need to see about doing a paracentesis and send the fluid off for culture and for cytology to see if there is another source.  Her labs show a sodium of 132.  Potassium 4.3.  BUN 64 creatinine 2.06.  Her kidney function is declining.  Her bilirubin is 1.9.  Alkaline phosphatase is 515.  Her white cell count is 15.8.  Hemoglobin 8.5.  Platelet count 49,000.  Not sure as to why her platelet count is dropping as it is.  This may be infection.  There may be something going on in the bone marrow.  I do not think we have to pursue a bone marrow biopsy right now.  If she is not eating.  Again, her decline continues.  This infection really has taken a lot out of her.  She is on antibiotics for the infection.  These abscesses have been draining.  The CT scan showed that the abscesses were decompressed.  The abscesses are growing Proteus and Bacteroides.  She has not had treatment now for a month with respect to her small cell lung cancer.  She has been responding well.  Again, this infection really has taken a lot out of her.  It is hard to say whether or not we will ever be able to treat her.  We we will follow along.  Hopefully, the paracentesis may help with the pain.  Hopefully will help with her appetite.  I just hate to see her like this.  She has always been so full of life.  Again I know this is taken quite a bit out of her.  I do appreciate the great care she is getting from the staff upon 6 E.   Rayleen Cal, MD  [facility] 41:10

## 2023-06-15 NOTE — Progress Notes (Signed)
  Daily Progress Note   Patient Name: Amanda Crawford       Date: 06/15/2023 DOB: 1945/03/23  Age: 78 y.o. MRN#: [id number] Attending Physician: Hoyt Macleod, MD Primary Care Physician: Neda Balk, MD Admit Date: 05/29/2023 Length of Stay: 17 days  Reason for Consultation/Follow-up: Establishing goals of care  Subjective:   CC: Patient resting in bed, daughter at bedside, less awake/alert.  Patient continues to complain of weakness, poor appetite, generalized abdominal discomfort and distension.    Following up regarding complex medical decision making.  Subjective: Reviewed EMR prior to presenting to bedside.    Goals of care discussions with daughter at bedside regarding time trial of current interventions and to continue to monitor hospital course for now.   Review of Systems Abdominal discomfort mild.  Objective:   Vital Signs:  BP (!) 109/55 (BP Location: Left Arm)   Pulse (!) 104   Temp (!) 96.9 F (36.1 C) (Axillary)   Resp 16   Ht 5\' 4"  (1.626 m)   Wt 89.6 kg   SpO2 97%   BMI 33.90 kg/m   Physical Exam: General: NAD, alert, elderly, chronically ill-appearing Cardiovascular: RRR Respiratory: no increased work of breathing noted, not in respiratory distress Abdomen: distended Neuro: less awake/alert.    Imaging:  I personally reviewed recent imaging.   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 difficile who was admitted on 05/29/2023 secondary to weakness and confusion. During hospitalization patient was found to have NSTEMI and being neutropenic. Patient found to have evidence of sepsis secondary to sigmoid diverticulitis with abscess and recurrent difficile colitis with E. coli bacteremia. General surgery, ID, and IR consulted during  hospitalization for recommendations. Palliative medicine team consulted to assist with complex medical decision making.   Recommendations/Plan: # Complex medical decision making/goals of care:  -      Noted palliative medicine team continue to follow along and engage in conversations as able and appropriate to assist with supporting next steps. Blood work regarding low albumin, elevated alk phos, elevated bilirubin and worsening renal function explained to patient's daughter to the best of my ability.   Time trial of current interventions for 48 hours or so and then consider shift to comfort care and hospice, PMT to continue to follow along.   also updated [facility] MD colleague Dr Gwynneth Lessen regarding today's palliative discussion.    -  Code Status: Limited: Do not attempt resuscitation (DNR) -DNR-LIMITED -Do Not Intubate/DNI      DNR DNI   # Psychosocial Support:  - Husband, son, daughter  # Discharge Planning: To Be Determined  Discussed with: Patient, patient's family at bedside.   Thank you for allowing the palliative care team to participate in the care [name]. High MDM Lujean Sake MD.  Palliative Care Provider PMT # [phone]  If patient remains symptomatic despite maximum doses, please call PMT at [phone] between 0700 and 1900. Outside of these hours, please call attending, as PMT does not have night coverage.

## 2023-06-16 ENCOUNTER — Other Ambulatory Visit: Payer: Self-pay

## 2023-06-16 DIAGNOSIS — C342 Malignant neoplasm of middle lobe, bronchus or lung: Secondary | ICD-10-CM | POA: Diagnosis not present

## 2023-06-16 DIAGNOSIS — A0472 Enterocolitis due to Clostridium difficile, not specified as recurrent: Secondary | ICD-10-CM | POA: Diagnosis not present

## 2023-06-16 DIAGNOSIS — Z515 Encounter for palliative care: Secondary | ICD-10-CM | POA: Diagnosis not present

## 2023-06-16 DIAGNOSIS — Z7189 Other specified counseling: Secondary | ICD-10-CM | POA: Diagnosis not present

## 2023-06-16 DIAGNOSIS — A4151 Sepsis due to Escherichia coli [E. coli]: Secondary | ICD-10-CM | POA: Diagnosis not present

## 2023-06-16 LAB — COMPREHENSIVE METABOLIC PANEL WITH GFR
ALT: 21 U/L (ref 0–44)
AST: 35 U/L (ref 15–41)
Albumin: 1.9 g/dL — ABNORMAL LOW (ref 3.5–5.0)
Alkaline Phosphatase: 568 U/L — ABNORMAL HIGH (ref 38–126)
Anion gap: 9 (ref 5–15)
BUN: 75 mg/dL — ABNORMAL HIGH (ref 8–23)
CO2: 16 mmol/L — ABNORMAL LOW (ref 22–32)
Calcium: 9.7 mg/dL (ref 8.9–10.3)
Chloride: 111 mmol/L (ref 98–111)
Creatinine, Ser: 2.17 mg/dL — ABNORMAL HIGH (ref 0.44–1.00)
GFR, Estimated: 22 mL/min — ABNORMAL LOW (ref >60)
Glucose, Bld: 121 mg/dL — ABNORMAL HIGH (ref 70–99)
Potassium: 4.5 mmol/L (ref 3.5–5.1)
Sodium: 136 mmol/L (ref 135–145)
Total Bilirubin: 1.5 mg/dL — ABNORMAL HIGH (ref 0.0–1.2)
Total Protein: 4 g/dL — ABNORMAL LOW (ref 6.5–8.1)

## 2023-06-16 LAB — CBC
HCT: 27.2 % — ABNORMAL LOW (ref 36.0–46.0)
Hemoglobin: 8.5 g/dL — ABNORMAL LOW (ref 12.0–15.0)
MCH: 33.6 pg (ref 26.0–34.0)
MCHC: 31.3 g/dL (ref 30.0–36.0)
MCV: 107.5 fL — ABNORMAL HIGH (ref 80.0–100.0)
Platelets: 57 10*3/uL — ABNORMAL LOW (ref 150–400)
RBC: 2.53 MIL/uL — ABNORMAL LOW (ref 3.87–5.11)
RDW: 21.9 % — ABNORMAL HIGH (ref 11.5–15.5)
WBC: 17.8 10*3/uL — ABNORMAL HIGH (ref 4.0–10.5)
nRBC: 0 % (ref 0.0–0.2)

## 2023-06-16 LAB — GLUCOSE, CAPILLARY
Glucose-Capillary: 109 mg/dL — ABNORMAL HIGH (ref 70–99)
Glucose-Capillary: 121 mg/dL — ABNORMAL HIGH (ref 70–99)
Glucose-Capillary: 95 mg/dL (ref 70–99)
Glucose-Capillary: 112 mg/dL — ABNORMAL HIGH (ref 70–99)

## 2023-06-16 LAB — PREALBUMIN: Prealbumin: <5 mg/dL — ABNORMAL LOW (ref 18–38)

## 2023-06-16 LAB — LACTIC ACID, PLASMA: Lactic Acid, Venous: 1.6 mmol/L (ref 0.5–1.9)

## 2023-06-16 MED ORDER — DEXTROSE-SODIUM CHLORIDE 5-0.9 % IV SOLN
INTRAVENOUS | Status: AC
Start: 2023-06-16 — End: 2023-06-17

## 2023-06-16 MED ORDER — LORAZEPAM 2 MG/ML IJ SOLN
0.5000 mg | INTRAMUSCULAR | Status: DC | PRN
Start: 2023-06-16 — End: 2023-06-17
  Administered 2023-06-16 – 2023-06-17 (×5): 0.5 mg via INTRAVENOUS
  Filled 2023-06-16 (×4): qty 1

## 2023-06-16 MED ORDER — HYDROMORPHONE HCL 1 MG/ML IJ SOLN
0.5000 mg | Freq: Once | INTRAMUSCULAR | Status: AC
  Administered 2023-06-16: 0.5 mg via INTRAVENOUS
  Filled 2023-06-16: qty 0.5

## 2023-06-16 NOTE — Progress Notes (Signed)
  Daily Progress Note   Patient Name: Amanda Crawford       Date: 06/16/2023 DOB: 1945-07-18  Age: 78 y.o. MRN#: [id number] Attending Physician: Hoyt Macleod, MD Primary Care Physician: Neda Balk, MD Admit Date: 05/29/2023 Length of Stay: 18 days  Reason for Consultation/Follow-up: Establishing goals of care  Subjective:   CC: Patient resting in bed, husband and son at bedside,  Patient continues to appear weak, minimal to nil PO intake at this point, underwent abdominal paracentesis on 06-15-23    Following up regarding complex medical decision making.  Subjective: Reviewed EMR prior to presenting to bedside.    Goals of care discussions with family at bedside regarding time trial of current interventions and to continue to monitor hospital course for now.   Review of Systems Abdominal discomfort mild.  Objective:   Vital Signs:  BP (!) 111/44 (BP Location: Left Arm)   Pulse (!) 104   Temp 98.5 F (36.9 C) (Axillary)   Resp 20   Ht 5\' 4"  (1.626 m)   Wt 89.6 kg   SpO2 100%   BMI 33.90 kg/m   Physical Exam: General: NAD, alert, elderly, chronically ill-appearing Cardiovascular: RRR Respiratory: no increased work of breathing noted, not in respiratory distress Abdomen: distended Neuro: less awake/alert.    Imaging:  I personally reviewed recent imaging.   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 difficile who was admitted on 05/29/2023 secondary to weakness and confusion. During hospitalization patient was found to have NSTEMI and being neutropenic. Patient found to have evidence of sepsis secondary to sigmoid diverticulitis with abscess and recurrent difficile colitis with E. coli bacteremia. General surgery, ID, and IR consulted during  hospitalization for recommendations. Palliative medicine team consulted to assist with complex medical decision making.   Recommendations/Plan: # Complex medical decision making/goals of care:  -      Noted palliative medicine team continue to follow along and engage in conversations as able and appropriate to assist with supporting next steps. Blood work regarding low albumin , elevated alk phos, elevated bilirubin and worsening renal function is being serially monitored and re discussed this with family at bedside today as well.    Time trial of current interventions for 48 hours or so and then consider shift to comfort care and hospice, PMT to continue to follow along.      -  Code Status: Limited: Do not attempt resuscitation (DNR) -DNR-LIMITED -Do Not Intubate/DNI      DNR DNI   # Psychosocial Support:  - Husband, son, daughter  # Discharge Planning: To Be Determined  Discussed with: Patient, patient's family at bedside.   Thank you for allowing the palliative care team to participate in the care Berkley Breech MDM Lujean Sake MD.  Palliative Care Provider PMT # [phone]  If patient remains symptomatic despite maximum doses, please call PMT at [phone] between 0700 and 1900. Outside of these hours, please call attending, as PMT does not have night coverage.

## 2023-06-16 NOTE — Plan of Care (Signed)
   Problem: Coping: Goal: Level of anxiety will decrease Outcome: Progressing   Problem: Elimination: Goal: Will not experience complications related to urinary retention Outcome: Progressing   Problem: Pain Managment: Goal: General experience of comfort will improve and/or be controlled Outcome: Progressing

## 2023-06-16 NOTE — Progress Notes (Signed)
 PROGRESS NOTE  Amanda Crawford  DOB: 04-24-1945  PCP: Neda Balk, MD ZOX:[id number]  DOA: 05/29/2023  LOS: 18 days  Hospital Day: 19  Brief narrative: Amanda Crawford is a 78 y.o. female with PMH significant for small cell lung cancer, prior renal cell cancer with lung mets, DM2, CKD, recently hospitalized in March for septic shock diverticulitis and C. difficile infection, discharged on oral Augmentin  and oral vancomycin . With the completion of antibiotics, patient's symptoms improved but few days ago, started again 4/29, patient presented to the ED with complaint of persistent watery diarrhea weakness, poor oral intake, confusion.  Initial labs with WBC count significantly low at 0.4, platelet 48,  Blood culture was sent Started on broad-spectrum antibiotics for neutropenia  Admitted to [facility] Blood culture sent on admission is growing E. coli 4/30, C. difficile PCR positive 4/30, CT abdomen and pelvis showed sigmoid diverticulitis, 3.5 X 4.4 X 5.6 cm abscess. Consultations obtained from oncology, ID, general surgery and IR.  Patient's overall clinical status has been worsening. In the last several days, her appetite has been low, her albumin  level is very low.  She has had progressive anasarca, low blood pressure, decline in renal function and lactic acidosis.  Subjective: Patient was seen and examined this morning. Somnolent, not awake, husband and son at bedside. Continues to appear weak.  No oral intake at all.  Per family, in the 24 hours, patient has barely opened eyes.  They understand her progressively worsening decline.  Assessment and plan: Sepsis POA Sigmoid diverticulitis and abscess E. coli bacteremia Immunocompromise status Presented with abdominal pain, diarrhea.   Imaging showed sigmoid diverticulitis with abscess  Per IR, abscess wass not amenable to percutaneous drainage. Per ID recommendation, patient was tried on IV Rocephin , and IV Flagyl . 5/6, repeat  CT abdomen showed enlarging abscess for which, she underwent CT-guided drain placement on 5/8. 5/11, repeat CT abdomen showed a residual collection. 5/15, CT abdomen was repeated and followed by IR.  Abscess size seem to be improving but continues to have free air throughout the abdomen related to perforated diverticulitis.  Also has moderate to large amount of ascites related to liver cirrhosis.  Also suspected adynamic ileus. Currently allowed for soft diet but patient has really no appetite. Per IR, once drain output is minimal, noted a plan to do drain injection study. Per ID, patient on ertapenem  now  Hypotension AKI on CKD 3B Acute metabolic acidosis Lactic acidosis Over the last several days, patient's appetite has been down, her albumin  level has been low, she has had progressive anasarca.  In the last 48 hours, she has progressive decline in blood pressure, steadily rising creatinine despite continuous IV hydration Patient was given 1 dose of IV albumin  on 5/14.  Currently on IV hydration but creatinine continues to worsen. Also on sodium bicarb 1300 mg 3 times daily but unable to wake up enough for oral intake Recent Labs    06/07/23 0525 06/08/23 0540 06/09/23 0525 06/10/23 1403 06/11/23 1127 06/12/23 0525 06/13/23 0500 06/14/23 0500 06/15/23 0506 06/16/23 0803  BUN 25* 21 20 29* 38* 44* 47* 60* 64* 75*  CREATININE 0.85 0.89 0.78 1.41* 1.31* 1.38* 1.36* 1.96* 2.06* 2.17*  CO2 20* 21* 24 19* 19* 19* 13* 16* 17* 16*    Recent Labs  Lab 06/14/23 0129 06/16/23 0803  LATICACIDVEN 2.0* 1.6    Acute diarrhea  C. difficile colitis Patient had recently completed treatment for C. difficile in March with oral vancomycin  Presented this  time with recurrence of diarrhea, poor oral intake 4/30, C. difficile PCR and antigen positive. Per ID recommendation, patient was started on Dificid  and completed the course on 5/11.  Currently on prophylactic vancomycin .  Acute on chronic  anemia  Patient has chronic macrocytic anemia related to cancer  Hemoglobin improved after 1 unit PRBC.  Currently stable close to 9.  No active bleeding. Continue iron supplement Was also on aspirin , currently on hold Recent Labs    09/07/22 1110 10/12/22 1036 02/08/23 1110 03/14/23 1116 06/12/23 0525 06/13/23 1445 06/14/23 0742 06/15/23 0506 06/16/23 0803  HGB  --    < > 13.0   < > 9.7* 10.3* 8.3* 8.5* 8.5*  MCV  --    < > 101.7*   < > 104.9* 104.8* 105.7* 106.0* 107.5*  VITAMINB12 612  --  784  --   --   --   --   --   --    < > = values in this interval not displayed.    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 Maria Shiner as an outpatient.   Neutropenia due to chemotherapy.  Received Neupogen  at one point. Neutropenic precautions.   Recent Labs  Lab 06/12/23 0525 06/13/23 1445 06/14/23 0129 06/14/23 0742 06/15/23 0506 06/16/23 0803  WBC 28.6* 23.0*  --  16.6* 15.8* 17.8*  LATICACIDVEN  --   --  2.0*  --   --  1.6   Severe thrombocytopenia 2 units of platelet transfused so far.  Platelet low but thankfully no bleeding. Continue SCDs for DVT prophylaxis.   Recent Labs  Lab 06/10/23 0440 06/11/23 1127 06/12/23 0525 06/13/23 1445 06/14/23 0742 06/15/23 0506 06/16/23 0803  PLT 136* 137* 122* 102* 53* 49* 57*    Extensive stage small cell lung cancer on chemotherapy Under the care of Dr. Maria Shiner Per oncology note, she had 3 cycles of chemotherapy, last on 05/21/2023.    Type 2 diabetes mellitus Hypoglycemia A1c 7.6 on 02/08/2023 PTA meds - Semglee  nightly. No longer on glimepiride  because of hypoglycemia episodes recently Currently on Semglee  7 units nightly.  With low appetite and low blood sugar, I would completely stop Semglee .  Continue SSI Accu-Cheks Switched IV fluids from NS to D5 NS Recent Labs  Lab 06/15/23 1247 06/15/23 1604 06/15/23 2119 06/16/23 0732 06/16/23 1221  GLUCAP 115* 104* 109* 109* 121*   H/o  hypertension PTA meds- Coreg  half tab of 3.125 mg twice daily Coreg  currently on hold due to low blood pressure, poor oral intake and diarrhea.  Liver cirrhosis Moderate to severe ascites new finding on CT scan during her last hospital stay, Subsequent CT scans have also shown moderate to severe ascites. 3.2 L of hazy yellow fluid removed from paracentesis 5/16   Hyperlipidemia statin on hold due to liver cirrhosis.  Recurrent acute urinary retention Failed voiding trial on 5/10.  Foley catheter reinserted  Currently has dark color urine in Foley  Severe hypoalbuminemia Albumin  level less than 1.5 due to poor nutrition.   Has widespread anasarca. Given 1 dose of IV albumin  last night   Mobility: Impaired mobility due to chemo.  Continue to work with PT  Goals of care   Code Status: Limited: Do not attempt resuscitation (DNR) -DNR-LIMITED -Do Not Intubate/DNI   Palliative care consult appreciated.  To monitor patient's status over the weekend.  Likely heading to comfort care    DVT prophylaxis:  Place and maintain sequential compression device Start: 05/31/23 [phone]  Antimicrobials: IV cefazolin , IV Flagyl , and oral vancomycin  Fluid: On D5 NS at 75 mL/h Consultants: Oncology, ID, general surgery Family Communication: Daughter and husband at bedside  Status: Inpatient Level of care:  Telemetry Medical   Patient is from: Home Needs to continue in-hospital care: Ongoing workup for diarrhea, diverticulitis, abscess Anticipated d/c to: Pending clinical course.  Prognosis not looking good.   Diet:  Diet Order             DIET SOFT Room service appropriate? Yes; Fluid consistency: Thin  Diet effective now                   Scheduled Meds:  calcitonin  4 Units/kg Subcutaneous BID   Chlorhexidine  Gluconate Cloth  6 each Topical Daily   feeding supplement  237 mL Oral BID BM   insulin  aspart  0-15 Units Subcutaneous TID WC   insulin  aspart  0-5 Units Subcutaneous  QHS   sodium bicarbonate   1,300 mg Oral TID   sodium chloride  flush  10-40 mL Intracatheter Q12H   sodium chloride  flush  5 mL Intracatheter Q8H   vancomycin   125 mg Oral Daily   Zinc  Oxide   Topical BID    PRN meds: albuterol , HYDROmorphone  (DILAUDID ) injection, LORazepam , [DISCONTINUED] ondansetron  **OR** ondansetron  (ZOFRAN ) IV, mouth rinse, sodium chloride  flush   Infusions:   dextrose  5 % and 0.9 % NaCl     ertapenem  500 mg (06/16/23 0834)    Antimicrobials: Anti-infectives (From admission, onward)    Start     Dose/Rate Route Frequency Ordered Stop   06/16/23 1000  vancomycin  (VANCOCIN ) capsule 125 mg        125 mg Oral Daily 06/15/23 1450     06/16/23 0800  ertapenem  (INVANZ ) 500 mg in sodium chloride  0.9 % 50 mL IVPB        500 mg 100 mL/hr over 30 Minutes Intravenous Every 24 hours 06/15/23 1153     06/15/23 1000  ceFEPIme  (MAXIPIME ) 2 g in sodium chloride  0.9 % 100 mL IVPB  Status:  Discontinued        2 g 200 mL/hr over 30 Minutes Intravenous Every 24 hours 06/14/23 1128 06/15/23 1153   06/12/23 1130  ceFEPIme  (MAXIPIME ) 2 g in sodium chloride  0.9 % 100 mL IVPB  Status:  Discontinued        2 g 200 mL/hr over 30 Minutes Intravenous Every 12 hours 06/12/23 1044 06/14/23 1128   06/11/23 1100  vancomycin  (VANCOCIN ) capsule 125 mg  Status:  Discontinued        125 mg Oral 2 times daily 06/11/23 1006 06/15/23 1450   06/10/23 2200  metroNIDAZOLE  (FLAGYL ) IVPB 500 mg        500 mg 100 mL/hr over 60 Minutes Intravenous Every 8 hours 06/10/23 2103 06/15/23 2239   06/06/23 2200  metroNIDAZOLE  (FLAGYL ) tablet 500 mg  Status:  Discontinued        500 mg Oral Every 12 hours 06/06/23 0951 06/10/23 2103   05/31/23 1600  vancomycin  (VANCOREADY) IVPB 1250 mg/250 mL  Status:  Discontinued        1,250 mg 166.7 mL/hr over 90 Minutes Intravenous Every 48 hours 05/29/23 1437 05/30/23 1416   05/31/23 1445  fidaxomicin  (DIFICID ) tablet 200 mg        200 mg Oral 2 times daily 05/31/23  1353 06/10/23 0959   05/31/23 1345  vancomycin  (VANCOCIN ) capsule 125 mg  Status:  Discontinued  125 mg Oral Daily 05/31/23 1253 05/31/23 1353   05/31/23 1000  fidaxomicin  (DIFICID ) tablet 200 mg  Status:  Discontinued        200 mg Oral 2 times daily 05/31/23 0812 05/31/23 0813   05/31/23 1000  metroNIDAZOLE  (FLAGYL ) IVPB 500 mg  Status:  Discontinued        500 mg 100 mL/hr over 60 Minutes Intravenous Every 12 hours 05/31/23 0813 06/06/23 0951   05/29/23 2300  cefTRIAXone  (ROCEPHIN ) 2 g in sodium chloride  0.9 % 100 mL IVPB  Status:  Discontinued        2 g 200 mL/hr over 30 Minutes Intravenous Every 24 hours 05/29/23 2211 06/12/23 1044   05/29/23 1700  ceFEPIme  (MAXIPIME ) 2 g in sodium chloride  0.9 % 100 mL IVPB  Status:  Discontinued        2 g 200 mL/hr over 30 Minutes Intravenous Every 12 hours 05/29/23 1437 05/29/23 2211   05/29/23 1600  metroNIDAZOLE  (FLAGYL ) IVPB 500 mg  Status:  Discontinued        500 mg 100 mL/hr over 60 Minutes Intravenous Every 12 hours 05/29/23 1511 05/31/23 0812   05/29/23 1500  vancomycin  (VANCOREADY) IVPB 2000 mg/400 mL        2,000 mg 200 mL/hr over 120 Minutes Intravenous  Once 05/29/23 1437 05/29/23 1724   05/29/23 0430  ceFEPIme  (MAXIPIME ) 2 g in sodium chloride  0.9 % 100 mL IVPB        2 g 200 mL/hr over 30 Minutes Intravenous  Once 05/29/23 0416 05/29/23 0523       Objective: Vitals:   06/15/23 1607 06/16/23 1311  BP: (!) 111/44 (!) 91/50  Pulse: (!) 104 (!) 108  Resp: 20 20  Temp: 98.5 F (36.9 C) (!) 97.5 F (36.4 C)  SpO2: 100% 91%    Intake/Output Summary (Last 24 hours) at 06/16/2023 1418 Last data filed at 06/16/2023 0524 Gross per 24 hour  Intake 1365.81 ml  Output 100 ml  Net 1265.81 ml   Filed Weights   05/29/23 0304 05/29/23 1416  Weight: 93.4 kg 89.6 kg   Weight change:  Body mass index is 33.9 kg/m.   Physical Exam: General exam: Very pleasant, elderly Caucasian female.  Not in pain.   Skin: No rashes,  lesions or ulcers. HEENT: Atraumatic, normocephalic, no obvious bleeding Lungs: Clear to auscultation bilaterally on low-flow oxygen. CVS: S1, S2, no murmur,   GI/Abd: Soft, continues to have tenderness in lower abdomen, nondistended, bowel sound present,   CNS: Deeply somnolent., barely tries to open eyes on sternal rub on my eval this morning Extremities: 2+ bilateral pedal edema, no calf tenderness  Data Review: I have personally reviewed the laboratory data and studies available.  F/u labs ordered Unresulted Labs (From admission, onward)     Start     Ordered   06/11/23 0640  CBC with Differential/Platelet  Daily,   R,   Status:  Canceled     Question:  Specimen collection method  Answer:  Unit=Unit collect   06/11/23 0639   06/02/23 0741  Prepare platelet pheresis  (Blood Administration Adult)  Once,   R       Question Answer Comment  Number of Apheresis Units (1 unit of apheresis platelets will increase platelets 30,000/mL in an avg sized adult) 1 unit   Transfusion Indications Plt = 10,000   Date/Time blood product needed For transfusion   If emergent release call blood bank Not emergent release  06/02/23 0741            Signed, Hoyt Macleod, MD [facility] 06/16/2023

## 2023-06-17 DIAGNOSIS — Z7189 Other specified counseling: Secondary | ICD-10-CM | POA: Diagnosis not present

## 2023-06-17 DIAGNOSIS — K630 Abscess of intestine: Secondary | ICD-10-CM

## 2023-06-17 DIAGNOSIS — C3490 Malignant neoplasm of unspecified part of unspecified bronchus or lung: Secondary | ICD-10-CM | POA: Diagnosis not present

## 2023-06-17 DIAGNOSIS — Z515 Encounter for palliative care: Secondary | ICD-10-CM | POA: Diagnosis not present

## 2023-06-17 LAB — GLUCOSE, CAPILLARY: Glucose-Capillary: 97 mg/dL (ref 70–99)

## 2023-06-17 MED ORDER — HYDROMORPHONE HCL 1 MG/ML IJ SOLN
0.5000 mg | INTRAMUSCULAR | Status: DC | PRN
  Filled 2023-06-17: qty 0.5

## 2023-06-17 MED ORDER — LORAZEPAM 2 MG/ML IJ SOLN: 0.5000 mg | INTRAMUSCULAR | Status: DC | PRN

## 2023-06-17 MED ORDER — HYDROMORPHONE HCL 1 MG/ML IJ SOLN
0.5000 mg | INTRAMUSCULAR | Status: DC
  Administered 2023-06-17 – 2023-06-18 (×8): 0.5 mg via INTRAVENOUS
  Filled 2023-06-17 (×7): qty 0.5

## 2023-06-17 NOTE — Progress Notes (Signed)
  Daily Progress Note   Patient Name: Amanda Crawford       Date: 06/17/2023 DOB: September 16, 1945  Age: 78 y.o. MRN#: [id number] Attending Physician: Hoyt Macleod, MD Primary Care Physician: Neda Balk, MD Admit Date: 05/29/2023 Length of Stay: 19 days  Reason for Consultation/Follow-up: Establishing goals of care  Subjective:   CC: Patient minimally responsive, husband and son-in-law and daughter at bedside,  Patient continues to appear weak, minimal to nil PO intake at this point, underwent abdominal paracentesis on 06-15-23   Following up regarding complex medical decision making.  Subjective: Reviewed EMR prior to presenting to bedside.    Goals of care discussions with family at bedside regarding time trial of current interventions and to continue to monitor hospital course for now.   Review of Systems Abdominal discomfort mild.  Objective:   Vital Signs:  BP (!) 98/50 (BP Location: Left Arm)   Pulse (!) 108   Temp 98.1 F (36.7 C) (Oral)   Resp 18   Ht 5\' 4"  (1.626 m)   Wt 89.6 kg   SpO2 99%   BMI 33.90 kg/m   Physical Exam: General: minimally responsive, elderly, chronically ill-appearing Cardiovascular: RRR Respiratory: no increased work of breathing noted, not in respiratory distress Abdomen: distended   Imaging:  I personally reviewed recent imaging.   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 difficile who was admitted on 05/29/2023 secondary to weakness and confusion. During hospitalization patient was found to have NSTEMI and being neutropenic. Patient found to have evidence of sepsis secondary to sigmoid diverticulitis with abscess and recurrent difficile colitis with E. coli bacteremia. General surgery, ID, and IR consulted during  hospitalization for recommendations. Palliative medicine team consulted to assist with complex medical decision making.   Recommendations/Plan: # Complex medical decision making/goals of care:  -      comfort measures.    -  Code Status: Limited: Do not attempt resuscitation (DNR) -DNR-LIMITED -Do Not Intubate/DNI      DNR DNI   # Psychosocial Support:  - Husband, son, daughter  # Discharge Planning: anticipate hospital death.   Discussed with: Patient, patient's family at bedside.   Thank you for allowing the palliative care team to participate in the care [name]. high MDM Lujean Sake MD.  Palliative Care Provider PMT # [phone]  If patient remains symptomatic despite maximum doses, please call PMT at [phone] between 0700 and 1900. Outside of these hours, please call attending, as PMT does not have night coverage.

## 2023-06-17 NOTE — Plan of Care (Signed)
   Problem: Coping: Goal: Level of anxiety will decrease Outcome: Progressing   Problem: Elimination: Goal: Will not experience complications related to urinary retention Outcome: Progressing   Problem: Pain Managment: Goal: General experience of comfort will improve and/or be controlled Outcome: Progressing

## 2023-06-17 NOTE — Progress Notes (Signed)
 MEWS Progress Note  Patient Details Name: Amanda Crawford MRN: [id number] DOB: 1945-06-14 Today's Date: 06/17/2023   MEWS Flowsheet Documentation:  Assess: MEWS Score Temp: 98.1 F (36.7 C) BP: (!) 98/50 MAP (mmHg): 65 Pulse Rate: (!) 108 ECG Heart Rate: 97 Resp: 18 Level of Consciousness: Unresponsive SpO2: 99 % O2 Device: Nasal Cannula O2 Flow Rate (L/min): 2 L/min FiO2 (%): 28 % Assess: MEWS Score MEWS Temp: 0 MEWS Systolic: 1 MEWS Pulse: 1 MEWS RR: 0 MEWS LOC: 3 MEWS Score: 5 MEWS Score Color: Red Assess: SIRS CRITERIA SIRS Temperature : 0 SIRS Respirations : 0 SIRS Pulse: 0 SIRS WBC: 0 SIRS Score Sum : 0 SIRS Temperature : 0 SIRS Pulse: 1 SIRS Respirations : 0 SIRS WBC: 1 SIRS Score Sum : 2 Assess: if the MEWS score is Yellow or Red Were vital signs accurate and taken at a resting state?: Yes Does the patient meet 2 or more of the SIRS criteria?: No Does the patient have a confirmed or suspected source of infection?: Yes MEWS guidelines implemented : Yes, red Treat MEWS Interventions: Considered administering scheduled or prn medications/treatments as ordered Take Vital Signs Increase Vital Sign Frequency : Red: Q1hr x2, continue Q4hrs until patient remains green for 12hrs Escalate MEWS: Escalate: Red: Discuss with charge nurse and notify provider. Consider notifying RRT. If remains red for 2 hours consider need for higher level of care Provider Notification Provider Name/Title: Gwynneth Lessen, MD Alejos Husband, MD Date Provider Notified: 06/17/23 Time Provider Notified: [phone] Method of Notification: Page Notification Reason: Change in status (unresponsive & red mews)      Keasia Dubose L Rim Thatch 06/17/2023, 8:29 AM

## 2023-06-17 NOTE — Progress Notes (Signed)
 PROGRESS NOTE  Amanda Crawford  DOB: August 15, 1945  PCP: Amanda Balk, MD GEX:[id number]  DOA: 05/29/2023  LOS: 19 days  Hospital Day: 20  Brief narrative: Amanda Crawford is a 78 y.o. female with PMH significant for small cell lung cancer, prior renal cell cancer with lung mets, DM2, CKD, recently hospitalized in March for septic shock diverticulitis and C. difficile infection, discharged on oral Augmentin  and oral vancomycin . With the completion of antibiotics, patient's symptoms improved but few days ago, started again 4/29, patient presented to the ED with complaint of persistent watery diarrhea weakness, poor oral intake, confusion.  Initial labs with WBC count significantly low at 0.4, platelet 48,  Blood culture was sent Started on broad-spectrum antibiotics for neutropenia  Admitted to [facility] Blood culture sent on admission is growing E. coli 4/30, C. difficile PCR positive 4/30, CT abdomen and pelvis showed sigmoid diverticulitis, 3.5 X 4.4 X 5.6 cm abscess. Consultations obtained from oncology, ID, general surgery and IR.  Patient's overall clinical status has been worsening. In the last several days, her appetite has been low, her albumin  level is very low.  She has had progressive anasarca, low blood pressure, decline in renal function and lactic acidosis.  Subjective: Patient was seen and examined this morning. Very minimally responsive. Family at bedside. Steeply declining.  Palliative care consult appreciated. Comfort care measures now.   Assessment and plan: Comfort status Admitted for sepsis. Timeline of events as below.  Other issues  Sepsis POA Sigmoid diverticulitis and abscess E. coli bacteremia Immunocompromise status Hypotension AKI on CKD 3B Acute metabolic acidosis Lactic acidosis  Acute diarrhea  C. difficile colitis Acute on chronic anemia  Small cell lung cancer with bone mets  Neutropenia Severe thrombocytopenia Extensive stage small  cell lung cancer on chemotherapy Type 2 diabetes mellitus Hypoglycemia H/o hypertension Liver cirrhosis Moderate to severe ascites  Hyperlipidemia Recurrent acute urinary retention Severe hypoalbuminemia   Mobility: Impaired mobility due to chemo.  Continue to work with PT  Goals of care   Code Status: Limited: Do not attempt resuscitation (DNR) -DNR-LIMITED -Do Not Intubate/DNI   Palliative care consult appreciated.  Comfort measures now   DVT prophylaxis:  Place and maintain sequential compression device Start: 05/31/23 0835    Family Communication: Daughter and husband at bedside  Status: Inpatient Level of care:  Telemetry Medical   Patient is from: Home Needs to continue in-hospital care: Ongoing workup for diarrhea, diverticulitis, abscess Anticipated d/c to: Pending clinical course.  Prognosis not looking good. Inhospitsl mortality likely   Diet:  Diet Order             DIET SOFT Room service appropriate? Yes; Fluid consistency: Thin  Diet effective now                   Scheduled Meds:  Chlorhexidine  Gluconate Cloth  6 each Topical Daily    HYDROmorphone  (DILAUDID ) injection  0.5 mg Intravenous Q4H    PRN meds: albuterol , HYDROmorphone  (DILAUDID ) injection, LORazepam , [DISCONTINUED] ondansetron  **OR** ondansetron  (ZOFRAN ) IV, mouth rinse   Infusions:   dextrose  5 % and 0.9 % NaCl      Antimicrobials: Anti-infectives (From admission, onward)    Start     Dose/Rate Route Frequency Ordered Stop   06/16/23 1000  vancomycin  (VANCOCIN ) capsule 125 mg  Status:  Discontinued        125 mg Oral Daily 06/15/23 1450 06/17/23 0934   06/16/23 0800  ertapenem  (INVANZ ) 500 mg in sodium chloride   0.9 % 50 mL IVPB  Status:  Discontinued        500 mg 100 mL/hr over 30 Minutes Intravenous Every 24 hours 06/15/23 1153 06/17/23 0934   06/15/23 1000  ceFEPIme  (MAXIPIME ) 2 g in sodium chloride  0.9 % 100 mL IVPB  Status:  Discontinued        2 g 200 mL/hr over 30  Minutes Intravenous Every 24 hours 06/14/23 1128 06/15/23 1153   06/12/23 1130  ceFEPIme  (MAXIPIME ) 2 g in sodium chloride  0.9 % 100 mL IVPB  Status:  Discontinued        2 g 200 mL/hr over 30 Minutes Intravenous Every 12 hours 06/12/23 1044 06/14/23 1128   06/11/23 1100  vancomycin  (VANCOCIN ) capsule 125 mg  Status:  Discontinued        125 mg Oral 2 times daily 06/11/23 1006 06/15/23 1450   06/10/23 2200  metroNIDAZOLE  (FLAGYL ) IVPB 500 mg        500 mg 100 mL/hr over 60 Minutes Intravenous Every 8 hours 06/10/23 2103 06/15/23 2239   06/06/23 2200  metroNIDAZOLE  (FLAGYL ) tablet 500 mg  Status:  Discontinued        500 mg Oral Every 12 hours 06/06/23 0951 06/10/23 2103   05/31/23 1600  vancomycin  (VANCOREADY) IVPB 1250 mg/250 mL  Status:  Discontinued        1,250 mg 166.7 mL/hr over 90 Minutes Intravenous Every 48 hours 05/29/23 1437 05/30/23 1416   05/31/23 1445  fidaxomicin  (DIFICID ) tablet 200 mg        200 mg Oral 2 times daily 05/31/23 1353 06/10/23 0959   05/31/23 1345  vancomycin  (VANCOCIN ) capsule 125 mg  Status:  Discontinued        125 mg Oral Daily 05/31/23 1253 05/31/23 1353   05/31/23 1000  fidaxomicin  (DIFICID ) tablet 200 mg  Status:  Discontinued        200 mg Oral 2 times daily 05/31/23 0812 05/31/23 0813   05/31/23 1000  metroNIDAZOLE  (FLAGYL ) IVPB 500 mg  Status:  Discontinued        500 mg 100 mL/hr over 60 Minutes Intravenous Every 12 hours 05/31/23 0813 06/06/23 0951   05/29/23 2300  cefTRIAXone  (ROCEPHIN ) 2 g in sodium chloride  0.9 % 100 mL IVPB  Status:  Discontinued        2 g 200 mL/hr over 30 Minutes Intravenous Every 24 hours 05/29/23 2211 06/12/23 1044   05/29/23 1700  ceFEPIme  (MAXIPIME ) 2 g in sodium chloride  0.9 % 100 mL IVPB  Status:  Discontinued        2 g 200 mL/hr over 30 Minutes Intravenous Every 12 hours 05/29/23 1437 05/29/23 2211   05/29/23 1600  metroNIDAZOLE  (FLAGYL ) IVPB 500 mg  Status:  Discontinued        500 mg 100 mL/hr over 60 Minutes  Intravenous Every 12 hours 05/29/23 1511 05/31/23 0812   05/29/23 1500  vancomycin  (VANCOREADY) IVPB 2000 mg/400 mL        2,000 mg 200 mL/hr over 120 Minutes Intravenous  Once 05/29/23 1437 05/29/23 1724   05/29/23 0430  ceFEPIme  (MAXIPIME ) 2 g in sodium chloride  0.9 % 100 mL IVPB        2 g 200 mL/hr over 30 Minutes Intravenous  Once 05/29/23 0416 05/29/23 0523       Objective: Vitals:   06/16/23 2200 06/17/23 0620  BP: (!) 104/51 (!) 98/50  Pulse:    Resp: 16 18  Temp: (!) 97.3 F (36.3 C)  98.1 F (36.7 C)  SpO2: 97% 99%    Intake/Output Summary (Last 24 hours) at 06/17/2023 1435 Last data filed at 06/17/2023 0538 Gross per 24 hour  Intake 913.33 ml  Output --  Net 913.33 ml   Filed Weights   05/29/23 0304 05/29/23 1416  Weight: 93.4 kg 89.6 kg   Weight change:  Body mass index is 33.9 kg/m.   Physical Exam: General exam: Very pleasant, elderly Caucasian female.  Not in distress. I did not do a detailed exam d/t comfort care status.  Data Review: I have personally reviewed the laboratory data and studies available.  F/u labs ordered Unresulted Labs (From admission, onward)     Start     Ordered   06/11/23 0640  CBC with Differential/Platelet  Daily,   R,   Status:  Canceled     Question:  Specimen collection method  Answer:  Unit=Unit collect   06/11/23 0639   06/02/23 0741  Prepare platelet pheresis  (Blood Administration Adult)  Once,   R       Question Answer Comment  Number of Apheresis Units (1 unit of apheresis platelets will increase platelets 30,000/mL in an avg sized adult) 1 unit   Transfusion Indications Plt = 10,000   Date/Time blood product needed For transfusion   If emergent release call blood bank Not emergent release      06/02/23 0741            Signed, Hoyt Macleod, MD [facility] 06/17/2023

## 2023-06-18 ENCOUNTER — Encounter: Payer: Self-pay | Admitting: *Deleted

## 2023-06-18 DIAGNOSIS — A4151 Sepsis due to Escherichia coli [E. coli]: Secondary | ICD-10-CM | POA: Diagnosis not present

## 2023-06-18 DIAGNOSIS — Z515 Encounter for palliative care: Secondary | ICD-10-CM | POA: Diagnosis not present

## 2023-06-18 DIAGNOSIS — Z7189 Other specified counseling: Secondary | ICD-10-CM | POA: Diagnosis not present

## 2023-06-18 MED ORDER — MORPHINE SULFATE (PF) 2 MG/ML IV SOLN: 1.0000 mg | INTRAVENOUS | Status: DC | PRN

## 2023-06-18 MED ORDER — SCOPOLAMINE 1 MG/3DAYS TD PT72
1.0000 | MEDICATED_PATCH | TRANSDERMAL | Status: DC
  Administered 2023-06-18: 1.5 mg via TRANSDERMAL
  Filled 2023-06-18: qty 1

## 2023-06-19 ENCOUNTER — Ambulatory Visit: Payer: Self-pay | Admitting: Hematology & Oncology

## 2023-06-19 ENCOUNTER — Inpatient Hospital Stay: Admitting: Dietician

## 2023-06-19 LAB — CYTOLOGY - NON PAP

## 2023-06-20 LAB — AEROBIC/ANAEROBIC CULTURE W GRAM STAIN (SURGICAL/DEEP WOUND): Culture: NO GROWTH

## 2023-06-26 ENCOUNTER — Other Ambulatory Visit

## 2023-06-26 ENCOUNTER — Ambulatory Visit: Admitting: Hematology & Oncology

## 2023-06-26 ENCOUNTER — Ambulatory Visit

## 2023-06-26 ENCOUNTER — Inpatient Hospital Stay

## 2023-06-27 ENCOUNTER — Ambulatory Visit

## 2023-06-28 ENCOUNTER — Ambulatory Visit

## 2023-06-29 ENCOUNTER — Inpatient Hospital Stay

## 2023-07-01 NOTE — Progress Notes (Signed)
   06/13/2023 1705  Spiritual Encounters  Type of Visit Initial  Care provided to: Family  Referral source Nurse (RN/NT/LPN)  Reason for visit Patient death   Per page by Diego Foy, RN I visited the family of Amanda Crawford at the time of her passing.  I met her husband, Amanda Crawford and daugther, Amanda Crawford joined by her spouse. I provided compassionate presence and prayer at their request. I invited some story sharing and reflection and encouraged this ongoing form of connection as a way to engage and support eAmy L. Minetta Aly, M.Div [id number] other and connect with Mrs. Stanislaw in [employer].

## 2023-07-01 NOTE — Death Summary Note (Signed)
 DEATH SUMMARY   Patient Details  Name: Amanda Crawford MRN: [id number] DOB: [birth year] MWN:[id number], Amanda Bussing, MD Admission/Discharge Information   Admit Date:  06-11-23  Date of Death: Date of Death: 01-Jul-2023  Time of Death: Time of Death: 07-16-18  Length of Stay: 20   Hospital Diagnoses: Principal Problem:   Sepsis (HCC) Active Problems:   Neutropenia (HCC)   C. difficile colitis   Abscess of sigmoid colon   Counseling and coordination of care   Enteritis due to Clostridium difficile   DNR (do not resuscitate) discussion   Goals of care, counseling/discussion   Palliative care encounter   Hospital Course:  Amanda Crawford is a 78 y.o. female with PMH significant for small cell lung cancer, prior renal cell cancer with lung mets, DM2, CKD, recently hospitalized in March for septic shock diverticulitis and C. difficile infection, discharged on oral Augmentin  and oral vancomycin . With the completion of antibiotics, patient's symptoms improved but few days ago, started again 11-Jun-2023, patient presented to the ED with complaint of persistent watery diarrhea weakness, poor oral intake, confusion.   Initial labs with WBC count significantly low at 0.4, platelet 48,  Blood culture was sent Started on broad-spectrum antibiotics for neutropenia   Admitted to [facility] Blood culture sent on admission is growing E. coli 4/30, C. difficile PCR positive 4/30, CT abdomen and pelvis showed sigmoid diverticulitis, 3.5 X 4.4 X 5.6 cm abscess. Consultations obtained from oncology, ID, general surgery and IR.   Patient was started on IV antibiotics.  Drain placement for abscess was withheld because of high risk.  On follow-up imaging, size of abscess worsened.  Drain was placed with partial resolution of abscess.  C. difficile diarrhea improved with Dificid . However, because of prolonged hospitalization, poor nutrition, low prealbumin level and overall generalized weakness, patient could not have an  overall improvement. In the last several days, her appetite has been low, her albumin  level is very low.  She has had progressive anasarca, low blood pressure, decline in renal function and lactic acidosis. Multiple discussions was held with multiple family members.  Palliative care consultation consulted as well. Patient was made comfort care status. Patient passed away in the hospital on 07/01/2023 at 1620  Cause of death Sepsis secondary to E. coli bacteremia secondary to diverticulitis         The results of significant diagnostics from this hospitalization (including imaging, microbiology, ancillary and laboratory) are listed below for reference.   Significant Diagnostic Studies: US  Paracentesis Result Date: 06/15/2023 INDICATION: Abdominal distention. History of diverticular abscess with percutaneous drain. Ascites. Request for diagnostic and therapeutic paracentesis. EXAM: ULTRASOUND GUIDED RIGHT LOWER QUADRANT PARACENTESIS MEDICATIONS: 1% plain lidocaine , 6 mL COMPLICATIONS: None immediate. PROCEDURE: Informed written consent was obtained from the patient after a discussion of the risks, benefits and alternatives to treatment. A timeout was performed prior to the initiation of the procedure. Initial ultrasound scanning demonstrates a large amount of ascites within the right lower abdominal quadrant. The right lower abdomen was prepped and draped in the usual sterile fashion. 1% lidocaine  was used for local anesthesia. Following this, a 19 gauge, 10-cm, Yueh catheter was introduced. An ultrasound image was saved for documentation purposes. The paracentesis was performed. The catheter was removed and a dressing was applied. The patient tolerated the procedure well without immediate post procedural complication. FINDINGS: A total of approximately 3.2 L of hazy, pale yellow fluid was removed. Samples were sent to the laboratory as requested by the clinical team.  IMPRESSION: Successful  ultrasound-guided paracentesis yielding 3.2 liters of peritoneal fluid. Procedure performed by Prudence Brown PA-C and supervised by Dr. Marland Silvas Electronically Signed   By: Nicoletta Barrier M.D.   On: 06/15/2023 14:42   CT ABDOMEN PELVIS WO CONTRAST Result Date: 06/14/2023 CLINICAL DATA:  Assess pelvic abscess and drain. EXAM: CT ABDOMEN AND PELVIS WITHOUT CONTRAST TECHNIQUE: Multidetector CT imaging of the abdomen and pelvis was performed following the standard protocol without IV contrast. RADIATION DOSE REDUCTION: This exam was performed according to the departmental dose-optimization program which includes automated exposure control, adjustment of the mA and/or kV according to patient size and/or use of iterative reconstruction technique. COMPARISON:  06/10/2023 FINDINGS: Lower chest: Postsurgical changes in left upper lung. Volume loss in the right lower lobe. No significant pleural effusions. Port catheter tip is near the superior cavoatrial junction. Hepatobiliary: Cholecystectomy. Liver contour is mildly nodular with slight enlargement of the left hepatic lobe. Findings are suggestive for cirrhosis. Again noted is a large amount of perihepatic ascites. Pancreas: Unremarkable. No pancreatic ductal dilatation or surrounding inflammatory changes. Spleen: Spleen is normal for size. Small amount of perisplenic ascites. Adrenals/Urinary Tract: Left nephrectomy. Question left adrenal gland. Normal appearance of the right adrenal gland. Focal cortical scarring in the lateral aspect of the right kidney and suspect previous surgery or ablation defect in this area. There may be residual contrast within the right renal cortex from the exam on 06/10/2023. No significant contrast in the collecting system. Negative for right hydronephrosis. Urinary bladder is decompressed with a Foley catheter. Stomach/Bowel: Scattered sigmoid diverticula. Normal appearance of the stomach. Mildly dilated loops of small bowel throughout  the abdomen. Oral contrast has moved into the colon from the exam on 06/10/2023. There is retained oral contrast in the colon. Findings are suggestive for slow bowel motility. Vascular/Lymphatic: Again noted are enlarged venous structures near the gastric cardia compatible with gastric varices. Atherosclerotic calcifications in the abdominal aorta without aneurysm. No significant lymph node enlargement in the abdomen or pelvis. Reproductive: Status post hysterectomy. No adnexal masses. Other: Moderate to large amount of ascites is similar to the prior examination. Stranding and fluid within the bowel mesentery. Again noted are multiple pockets of free air in the anterior abdomen. The amount of free air is similar to the exam on 06/10/2023. Again noted is a percutaneous drain in the anterior pelvis with the tip coiled next to the sigmoid colon. The pericolonic abscess appears to be decompressed. No significant abscess collection identified. Musculoskeletal: Stable sclerotic foci involving the pelvis. Again noted is a sclerotic focus involving the right sixth rib. Findings are compatible with sclerotic bone metastasis. Scattered areas of subcutaneous edema. IMPRESSION: 1. Pericolonic pelvic abscess appears to be decompressed with the percutaneous drain. No significant abscess collection identified but limited evaluation without intravascular contrast. 2. Persistent free air throughout the abdomen and similar to the exam on 06/10/2023. This could be related to the perforated diverticulitis and a persistent colonic fistula with the drain. 3. Cirrhosis with moderate to large amount of ascites. 4. Right kidney parenchyma remains dense and suspect there is retained contrast from the exam on 06/10/2023. Findings raise concern for acute kidney injury. 5. Small bowel is mildly distended and evidence for slow bowel motility. Findings could be associated with an adynamic ileus. 6. Sclerotic bone foci compatible with metastatic  disease. Electronically Signed   By: Elene Griffes M.D.   On: 06/14/2023 11:15   DG CHEST PORT 1 VIEW Result Date: 06/11/2023 CLINICAL DATA:  Hypoxia EXAM: PORTABLE CHEST 1 VIEW COMPARISON:  05/29/2023 FINDINGS: Cardiac shadow is stable. Postsurgical changes are noted on the left. Right chest wall port is noted in satisfactory position. Mild bibasilar atelectasis is noted. IMPRESSION: Mild bibasilar atelectasis. Electronically Signed   By: Violeta Grey M.D.   On: 06/11/2023 21:05   CT ABDOMEN PELVIS W CONTRAST Result Date: 06/10/2023 CLINICAL DATA:  Colitis, immunotherapy related toxicity evaluate for interval change in degree of colitis, worsening leukocytosis and lack of improvement in abdominal pain EXAM: CT ABDOMEN AND PELVIS WITH CONTRAST TECHNIQUE: Multidetector CT imaging of the abdomen and pelvis was performed using the standard protocol following bolus administration of intravenous contrast. RADIATION DOSE REDUCTION: This exam was performed according to the departmental dose-optimization program which includes automated exposure control, adjustment of the mA and/or kV according to patient size and/or use of iterative reconstruction technique. CONTRAST:  80mL OMNIPAQUE  IOHEXOL  300 MG/ML  SOLN COMPARISON:  CT 06/05/2023 FINDINGS: Lower chest: Elevated right hemidiaphragm with adjacent compressive atelectasis/scarring in the right middle and lower lobes. Dependent atelectasis in the left lower lobe. The known right lower lobe pulmonary nodule is obscured by atelectasis on the current exam, series 4, image 26. Hepatobiliary: Cirrhotic liver morphology. No focal hepatic lesion. Clips in the gallbladder fossa postcholecystectomy. No biliary dilatation. Pancreas: Fatty atrophy. No disproportionate inflammation. Edema is likely related to ascites and unchanged. Spleen: No splenomegaly.  No focal abnormality. Adrenals/Urinary Tract: No adrenal nodule. Left nephrectomy with stable appearance of the nephrectomy  bed. Stable chronic scarring in the mid lower right kidney. No right hydronephrosis. No right renal inflammation. Partially distended urinary bladder. There is air in the non dependent bladder. Stomach/Bowel: Administered enteric contrast reaches the descending colon. Persistent distal descending and proximal sigmoid colonic wall thickening in the region of multiple diverticula. There is no progressive bowel inflammation from prior. Interval drainage of pericolonic abscess with a drainage catheter is adjacent to the colon. The heterogeneous collection is not definitively seen, there is currently minimally complex fluid adjacent to the drain on the current exam, measuring 4.4 x 2.7 cm, series 2, image 89. This previously measured 4.7 x 3.7 cm. The lateral aspect of this collection abuts the urinary bladder. There are no new sites of bowel inflammation or wall thickening. No small bowel wall thickening or inflammation. Unremarkable appearance of the stomach. There are perigastric and paraesophageal varices. Vascular/Lymphatic: Aortic atherosclerosis without aneurysm. The portal vein is patent. There are perigastric and paraesophageal varices as well as left upper quadrant varices. Prominent right epicardial nodes are unchanged. No definite enlarged abdominopelvic lymph nodes. Reproductive: Status post hysterectomy. No adnexal masses. Other: Moderate amount of free air is seen in the anterior omentum, for example series 2, image 63. Patchy locules of free air present extending from the subdiaphragmatic region to the pelvis. Moderate volume abdominopelvic ascites, mildly increased from prior exam, predominantly simple fluid. Musculoskeletal: Posterior spurring at L1-L2 causes narrowing of the spinal canal. Stable sclerotic lesion in the left iliac bone. Small sclerotic lesion in the right ischium is again seen. No new osseous abnormalities over the last 5 days. IMPRESSION: 1. Interval drainage of pericolonic abscess  with a drainage catheter adjacent to the colon. The heterogeneous collection is not definitively seen, there is currently a minimally complex fluid adjacent to the drain on the current exam, measuring 4.4 x 2.7 cm. This previously measured 4.7 x 3.7 cm. This may represent a residual collection, less likely loculated ascites. 2. Moderate amount of free air in the anterior  abdomen and pelvis. This was not seen on prior exam. This could be related to recent procedure and drain flushing, however difficult to exclude colonic perforation in this setting. Persistent distal descending and proximal sigmoid colonic wall thickening in the region of multiple diverticula, without progressive bowel inflammation. Continued clinical follow-up is needed. 3. Cirrhosis with sequela of portal hypertension including varices. Moderate volume abdominopelvic ascites, mildly increased from prior exam, predominantly simple fluid. 4. Additional stable chronic findings. Aortic Atherosclerosis (ICD10-I70.0). These results were called by telephone at the time of interpretation on 06/10/2023 at 6:05 pm to the patient's nurse, no physician was available at the time of the exam interpretation to receive results. Patient's nurse will get in touch with the doctor and convey these results. Electronically Signed   By: Chadwick Colonel M.D.   On: 06/10/2023 18:11   CT GUIDED PERITONEAL/RETROPERITONEAL FLUID DRAIN BY PERC CATH Result Date: 06/07/2023 INDICATION: Pelvic diverticular abscess EXAM: CT-guided drain placement TECHNIQUE: Multidetector CT imaging of the pelvis was performed following the standard protocol with/without IV contrast. RADIATION DOSE REDUCTION: This exam was performed according to the departmental dose-optimization program which includes automated exposure control, adjustment of the mA and/or kV according to patient size and/or use of iterative reconstruction technique. MEDICATIONS: The patient is currently admitted to the hospital  and receiving intravenous antibiotics. The antibiotics were administered within an appropriate time frame prior to the initiation of the procedure. ANESTHESIA/SEDATION: Moderate (conscious) sedation was employed during this procedure. A total of Versed  1 mg and Fentanyl  50 mcg was administered intravenously by the radiology nurse. Total intra-service moderate Sedation Time: 50 minutes. The patient's level of consciousness and vital signs were monitored continuously by radiology nursing throughout the procedure under my direct supervision. COMPLICATIONS: None immediate. PROCEDURE: Informed written consent was obtained from the patient after a thorough discussion of the procedural risks, benefits and alternatives. All questions were addressed. Maximal Sterile Barrier Technique was utilized including caps, mask, sterile gowns, sterile gloves, sterile drape, hand hygiene and skin antiseptic. A timeout was performed prior to the initiation of the procedure. With the patient in a supine position, initial images of the pelvis were obtained. The abscess previously identified on the CT of the abdomen pelvis from Jun 05, 2023 was without significant change. Radiopaque markers were placed on the patient's pelvis and repeat imaging was performed in order to demarcate the access site. Patient's skin was then marked, prepped, and draped in the usual sterile fashion. The skin and deeper tissue was anesthetized with 1% lidocaine . Small incision was made the patient is scanned and the Yueh needle 15 cm was advanced sequentially into the sigmoid diverticular abscess. Once the needle tip was within the abscess, a guidewire was advanced and coiled within the abscess cavity. The access tract was then dilated with a 10 French fascial dilator. After the tract was dilated, a 10 French pigtail drainage catheter was advanced over the guidewire and coiled within the abscess. The guidewire was removed. Retention suture and sterile dressing were  applied. The catheter was connected to a JP bulb and instructions on catheter flushing were placed in the patient's chart. IMPRESSION: Satisfactory CT-guided placement of a 10 French drainage catheter is sigmoid diverticular abscess. There appears to be a large communication between the diverticular abscess and the bowel. Electronically Signed   By: Susan Ensign   On: 06/07/2023 14:55   CT ABDOMEN PELVIS W CONTRAST Result Date: 06/05/2023 CLINICAL DATA:  Metastatic small cell lung cancer with recent perforated diverticulitis and  small peridiverticular abscess. Diarrhea. EXAM: CT ABDOMEN AND PELVIS WITH CONTRAST TECHNIQUE: Multidetector CT imaging of the abdomen and pelvis was performed using the standard protocol following bolus administration of intravenous contrast. RADIATION DOSE REDUCTION: This exam was performed according to the departmental dose-optimization program which includes automated exposure control, adjustment of the mA and/or kV according to patient size and/or use of iterative reconstruction technique. CONTRAST:  OMNIPAQUE  IOHEXOL  300 MG/ML  SOLN COMPARISON:  Abdominopelvic CT 05/30/2023.  PET-CT 05/14/2023. FINDINGS: Lower chest: Similar appearance of residual treated nodule at the right lung base, measuring approximately 1.9 x 1.5 cm on image 24/4. There is atelectasis or scarring at both lung bases. No significant pleural or pericardial effusion. Central venous catheter projects to the superior cavoatrial junction. There is aortic and coronary artery atherosclerosis. Distal esophageal wall thickening noted. Hepatobiliary: Chronic morphologic changes of cirrhosis. No suspicious focal liver lesion or biliary dilatation identified status post cholecystectomy. Pancreas: Unremarkable. No pancreatic ductal dilatation or surrounding inflammatory changes. Spleen: Normal in size without focal abnormality. Adrenals/Urinary Tract: Both adrenal glands appear normal. Previous left nephrectomy with  stable appearance of the nephrectomy bed. The right kidney has a stable appearance with scarring in the lateral interpolar region. No evidence of urinary tract calculus or hydronephrosis. The urinary bladder is decompressed without apparent focal abnormality. Stomach/Bowel: There is enteric contrast within the distal small bowel and colon, extending to the level of the descending colon. The stomach appears unremarkable for its degree of distention. There is no significant small bowel distension, wall thickening or surrounding inflammation. Status post appendectomy. No acute findings are seen in the proximal colon. Diverticulosis and wall thickening of the descending colon again noted with an enlarging collection of gas and fluid inferior to the sigmoid colon, measuring 4.7 x 3.7 cm on image 90/2. This collection appears more organized than on the previous study with mild peripheral enhancement, suspicious for developing abscess. No contrast extravasation. A rectal tube is in place. Vascular/Lymphatic: There are no enlarged abdominal or pelvic lymph nodes. Aortic and branch vessel atherosclerosis without evidence of aneurysm or large vessel occlusion. The portal, superior mesenteric and splenic veins are patent. There are small gastric varices. Reproductive: Status post hysterectomy. No evidence of adnexal mass. Other: Interval development of a moderate amount of ascites throughout the peritoneal cavity. This fluid appears simple, measuring water  density. There are no dependent high density components, extravasated enteric contrast or other focal fluid collection. No pneumoperitoneum. Interval increased mesenteric and subcutaneous edema. No focal abdominal wall hernia. Musculoskeletal: Stable known sclerotic metastasis involving the left iliac bone. Possible small metastasis within the right ischium. No progressive osseous metastatic disease or acute osseous findings identified. Old fracture of the right 6th rib  laterally. There is multilevel spondylosis with prominent posterior osteophytes at L1-2 and L3-4. IMPRESSION: 1. Enlarging collection of gas and fluid inferior to the sigmoid colon, suspicious for developing abscess. No evidence of contrast extravasation or pneumoperitoneum. 2. Interval development of a moderate amount of ascites throughout the peritoneal cavity. Although nonspecific, this may relate to the patient's cirrhosis and/or reactive ascites from the diverticulitis. No other focal fluid collections are identified. 3. Chronic morphologic changes of cirrhosis with small gastric varices. 4. Stable appearance of residual treated nodule at the right lung base. 5. Stable osseous metastatic disease. No progressive osseous metastatic disease identified. 6. Coronary and aortic Atherosclerosis (ICD10-I70.0). Electronically Signed   By: Elmon Hagedorn M.D.   On: 06/05/2023 15:55   CT ABDOMEN PELVIS W CONTRAST Result Date:  05/30/2023 CLINICAL DATA:  Abdominal pain, acute, nonlocalized. EXAM: CT ABDOMEN AND PELVIS WITH CONTRAST TECHNIQUE: Multidetector CT imaging of the abdomen and pelvis was performed using the standard protocol following bolus administration of intravenous contrast. RADIATION DOSE REDUCTION: This exam was performed according to the departmental dose-optimization program which includes automated exposure control, adjustment of the mA and/or kV according to patient size and/or use of iterative reconstruction technique. CONTRAST:  OMNIPAQUE  IOHEXOL  300 MG/ML  SOLN COMPARISON:  CT of the abdomen and pelvis 04/08/2023. FINDINGS: Lower chest: Mild dependent atelectasis is present in the right lower lobe. The lungs are otherwise clear. The heart size is normal. Coronary artery calcifications are present. No significant pleural or pericardial effusion is present. Hepatobiliary: No focal liver abnormality is seen. Status post cholecystectomy. No biliary dilatation. Pancreas: Minimal inflammatory  changes about the head and tail the pancreas are less prominent than on the prior exam. No discrete lesions are present. Spleen: Normal in size without focal abnormality. Adrenals/Urinary Tract: The adrenal glands are normal bilaterally. Mild right-sided hydronephrosis are present. Scarring at the lower pole of the right kidney is stable. The right ureter is dilated to the level of the right UVJ. No obstructing stone or mass lesion is evident. Stranding is noted about the distal ureter. Left nephrectomy is again noted. The urinary bladder is mildly distended. Stomach/Bowel: The stomach and duodenum are within normal limits. Small bowel is unremarkable. Terminal ileum is within normal limits. The appendix is absent. The ascending and transverse colon are within normal limits. The descending colon demonstrates some distal diverticular changes. Diffuse diverticular changes are present the sigmoid colon. Marked wall thickening is present proximally. A fluid collection adjacent to the inflamed sigmoid colon measures 4.4 x 5.6 x 3.5 cm. It abuts the left side of the urinary bladder. No free fluid is present. The distal sigmoid colon and rectum are within normal limits. Vascular/Lymphatic: Atherosclerotic calcifications are present in the aorta and branch vessels. No aneurysm is present. No significant adenopathy is present. Reproductive: Status post hysterectomy. No adnexal masses. Other: No abdominal wall hernia or abnormality. No abdominopelvic ascites. Musculoskeletal: Multilevel degenerative changes are again noted in the lumbar spine. Vacuum disc is present at L1-2, L3-4, L4-5 and L5-S1. Right foraminal narrowing and bilateral scratched at right foraminal stenosis is present at L4-5. Bilateral foraminal narrowing is present at L5-S1. IMPRESSION: 1. Sigmoid diverticulitis. 2. 4.4 x 5.6 x 3.5 cm fluid collection adjacent to the inflamed sigmoid colon compatible with abscess. 3. Mild right-sided hydronephrosis and  hydroureter to the level of the right UVJ. No obstructing stone or mass lesion is evident. This may represent a recently passed stone. 4. Left nephrectomy. 5. Multilevel degenerative changes in the lumbar spine. 6. Coronary artery disease. Electronically Signed   By: Audree Leas M.D.   On: 05/30/2023 16:49   CT Angio Chest PE W and/or Wo Contrast Result Date: 05/29/2023 CLINICAL DATA:  Weakness and decreased appetite. Disorientation. Small-cell lung cancer. EXAM: CT ANGIOGRAPHY CHEST WITH CONTRAST TECHNIQUE: Multidetector CT imaging of the chest was performed using the standard protocol during bolus administration of intravenous contrast. Multiplanar CT image reconstructions and MIPs were obtained to evaluate the vascular anatomy. RADIATION DOSE REDUCTION: This exam was performed according to the departmental dose-optimization program which includes automated exposure control, adjustment of the mA and/or kV according to patient size and/or use of iterative reconstruction technique. CONTRAST:  60mL OMNIPAQUE  IOHEXOL  350 MG/ML SOLN COMPARISON:  Chest CT 04/08/2023 FINDINGS: Cardiovascular: The heart size is normal.  No substantial pericardial effusion. Moderate atherosclerotic calcification is noted in the wall of the thoracic aorta. There is no filling defect within the opacified pulmonary arteries to suggest the presence of an acute pulmonary embolus. Mediastinum/Nodes: No mediastinal lymphadenopathy. There is no hilar lymphadenopathy. The esophagus has normal imaging features. There is no axillary lymphadenopathy. Lungs/Pleura: Retro hilar right lower lobe pulmonary lesion measured previously at 2.7 x 2.3 cm is 2.6 x 1.5 cm on image 63/303. Postsurgical changes noted in the region of the left hilum. No pleural effusion. Upper Abdomen: Nodular liver contour suggests cirrhosis. Musculoskeletal: No worrisome lytic or sclerotic osseous abnormality. Review of the MIP images confirms the above findings.  IMPRESSION: 1. No CT evidence for acute pulmonary embolus. 2. Retro hilar right lower lobe pulmonary lesion measured previously at 2.7 x 2.3 cm is 2.6 x 1.5 cm. 3. Nodular liver contour suggests cirrhosis. 4.  Aortic Atherosclerosis (ICD10-I70.0). Electronically Signed   By: Donnal Fusi M.D.   On: 05/29/2023 08:15   DG Chest 1 View Result Date: 05/29/2023 CLINICAL DATA:  Altered mental status EXAM: CHEST  1 VIEW COMPARISON:  04/08/2023 FINDINGS: Lungs are clear. No pneumothorax or pleural effusion. Right internal jugular chest port tip is seen at the superior cavoatrial junction. Cardiac size within limits. Pulmonary vascularity is normal. IMPRESSION: 1. No active disease. Electronically Signed   By: Worthy Heads M.D.   On: 05/29/2023 03:44   CT Head Wo Contrast Result Date: 05/29/2023 EXAM: CT HEAD WITHOUT 05/29/2023 03:29:37 AM TECHNIQUE: CT of the head was performed without the administration of intravenous contrast. Automated exposure control, iterative reconstruction, and/or weight based adjustment of the mA/kV was utilized to reduce the radiation dose to as low as reasonably achievable. COMPARISON: 04/08/2023 CLINICAL HISTORY: Mental status change, unknown cause. Aphasia. Patient currently on chemotherapy. History of diabetes, lung cancer, migraine, renal cell cancer, and renal insufficiency. FINDINGS: BRAIN AND VENTRICLES: There is no acute intracranial hemorrhage, mass effect or midline shift. No abnormal extra-axial fluid collection. The gray-white differentiation is maintained without evidence of an acute infarct. There is no evidence of hydrocephalus. Mild scattered periventricular and subcortical low density, likely reflecting small vessel ischemic changes. ORBITS: The visualized portion of the orbits demonstrate no acute abnormality. SINUSES: The visualized paranasal sinuses and mastoid air cells demonstrate no acute abnormality. SOFT TISSUES AND SKULL: No acute abnormality of the visualized  skull or soft tissues. VASCULATURE: Intracranial atherosclerosis. IMPRESSION: 1. No acute intracranial abnormality. 2. Mild small vessel ischemic changes. Electronically signed by: Zadie Herter MD 05/29/2023 03:32 AM EDT RP Workstation: [id number]    Microbiology: Recent Results (from the past 240 hours)  Aerobic/Anaerobic Culture w Gram Stain (surgical/deep wound)     Status: None   Collection Time: 06/15/23 11:32 AM   Specimen: PATH Cytology Peritoneal fluid  Result Value Ref Range Status   Specimen Description   Final    FLUID PERITONEAL FLUID Performed at [facility], [address], [place], [place] [address]    Special Requests   Final    NONE Performed at [facility], [address], [place], [place] [address]    Gram Stain   Final    FEW WBC PRESENT, PREDOMINANTLY PMN NO ORGANISMS SEEN    Culture   Final    No growth aerobically or anaerobically. Performed at [facility], [address] [place], [place] [address]    Report Status 06/20/2023 FINAL  Final     Signed: Hoyt Macleod, MD 06/06/2023

## 2023-07-01 NOTE — Progress Notes (Signed)
  Daily Progress Note   Patient Name: Amanda Crawford       Date: 06/04/2023 DOB: Jan 28, 1946  Age: 78 y.o. MRN#: [id number] Attending Physician: Hoyt Macleod, MD Primary Care Physician: Neda Balk, MD Admit Date: 05/29/2023 Length of Stay: 20 days  Reason for Consultation/Follow-up: Establishing goals of care  Subjective:   CC: Patient minimally responsive, husband and son  at bedside,  Patient continues to appear weak,  nil PO intake at this point, underwent abdominal paracentesis on 06-15-23   Following up regarding complex medical decision making.  Subjective: Reviewed EMR prior to presenting to bedside.    Goals of care discussions with family at bedside regarding continuing comfort measures, anticipated hospital death, not stable enough to be moved to hospice house.  Review of Systems Now unresponsive.  Objective:   Vital Signs:  BP (!) 70/43 (BP Location: Left Arm)   Pulse (!) 125   Temp 99.3 F (37.4 C) (Oral)   Resp 16   Ht 5\' 4"  (1.626 m)   Wt 89.6 kg   SpO2 95%   BMI 33.90 kg/m   Physical Exam: General: minimally responsive, elderly, chronically ill-appearing Cardiovascular: RRR Respiratory: no increased work of breathing noted, not in respiratory distress Abdomen: distended   Imaging:  I personally reviewed recent imaging.   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 difficile who was admitted on 05/29/2023 secondary to weakness and confusion. During hospitalization patient was found to have NSTEMI and being neutropenic. Patient found to have evidence of sepsis secondary to sigmoid diverticulitis with abscess and recurrent difficile colitis with E. coli bacteremia. General surgery, ID, and IR consulted during hospitalization for  recommendations. Palliative medicine team consulted to assist with complex medical decision making.   Recommendations/Plan: # Complex medical decision making/goals of care:  -      comfort measures.    -  Code Status: Do not attempt resuscitation (DNR) - Comfort care     DNR DNI  Add Scopolamine  patch, continue current pain and non-pain symptom management regimen and monitor.  # Psychosocial Support:  - Husband, son, daughter  # Discharge Planning: anticipate hospital death.   Discussed with: Patient, patient's family at bedside.   Thank you for allowing the palliative care team to participate in the care Amanda Crawford. Mod MDM Lujean Sake MD.  Palliative Care Provider PMT # [phone]  If patient remains symptomatic despite maximum doses, please call PMT at [phone] between 0700 and 1900. Outside of these hours, please call attending, as PMT does not have night coverage.

## 2023-07-01 NOTE — Progress Notes (Signed)
 Pt found to have no breath sounds, no pulse, and time of death pronounced at 1620. Second verification done with Jonothan Neve, RN. Family at bedside.

## 2023-07-01 NOTE — Progress Notes (Addendum)
 PROGRESS NOTE  Amanda Crawford  DOB: [birth year]  PCP: Neda Balk, MD FAO:[id number]  DOA: 05/29/2023  LOS: 20 days  Hospital Day: 21  Brief narrative: Amanda Crawford is a 78 y.o. female with PMH significant for small cell lung cancer, prior renal cell cancer with lung mets, DM2, CKD, recently hospitalized in March for septic shock diverticulitis and C. difficile infection, discharged on oral Augmentin  and oral vancomycin . With the completion of antibiotics, patient's symptoms improved but few days ago, started again 4/29, patient presented to the ED with complaint of persistent watery diarrhea weakness, poor oral intake, confusion.  Initial labs with WBC count significantly low at 0.4, platelet 48,  Blood culture was sent Started on broad-spectrum antibiotics for neutropenia  Admitted to [facility] Blood culture sent on admission is growing E. coli 4/30, C. difficile PCR positive 4/30, CT abdomen and pelvis showed sigmoid diverticulitis, 3.5 X 4.4 X 5.6 cm abscess. Consultations obtained from oncology, ID, general surgery and IR.  Patient was started on IV antibiotics.  Drain placement for abscess was withheld because of high risk.  On follow-up imaging, size of abscess worsened.  Drain was placed with partial resolution of abscess.  C. difficile diarrhea improved with Dificid . However, because of prolonged hospitalization, poor nutrition, low prealbumin level and overall generalized weakness, patient could not have an overall improvement. In the last several days, her appetite has been low, her albumin  level is very low.  She has had progressive anasarca, low blood pressure, decline in renal function and lactic acidosis. Multiple discussions was held with multiple family members.  Palliative care consultation cruciated as well. Patient is now comfort care status In-hospital mortality likely  Subjective: Patient was seen this morning.  Breathing slow. Husband at bedside.   Tearful. In-hospital mortality more likely  Assessment and plan: Comfort status Admitted for sepsis. Timeline of events as explained above  Other issues currently not being treated Sepsis POA Sigmoid diverticulitis and abscess E. coli bacteremia Immunocompromise status Hypotension AKI on CKD 3B Acute metabolic acidosis Lactic acidosis  Acute diarrhea  C. 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  Comfort measures now   DVT prophylaxis:  Place and maintain sequential compression device Start: 05/31/23 0835    Family Communication: Daughter and husband at bedside  Status: Inpatient Level of care:  Telemetry Medical   Patient is from: Home Anticipated d/c to: Pending clinical course.  Prognosis not looking good. Inhospital mortality likely   Diet:  Diet Order             DIET SOFT Room service appropriate? Yes; Fluid consistency: Thin  Diet effective now                   Scheduled Meds:  Chlorhexidine  Gluconate Cloth  6 each Topical Daily    HYDROmorphone  (DILAUDID ) injection  0.5 mg Intravenous Q4H   scopolamine   1 patch Transdermal Q72H    PRN meds: albuterol , HYDROmorphone  (DILAUDID ) injection, LORazepam , morphine  injection, [DISCONTINUED] ondansetron  **OR** ondansetron  (ZOFRAN ) IV, mouth rinse   Objective: Vitals:   06/17/23 0620 06/29/2023 0514  BP: (!) 98/50 (!) 70/43  Pulse:  Aaron Aas)  125  Resp: 18 16  Temp: 98.1 F (36.7 C) 99.3 F (37.4 C)  SpO2: 99% 95%    Intake/Output Summary (Last 24 hours) at 06/17/2023 1434 Last  data filed at 06/02/2023 0800 Gross per 24 hour  Intake 10 ml  Output 100 ml  Net -90 ml   Filed Weights   05/29/23 0304 05/29/23 1416  Weight: 93.4 kg 89.6 kg   Weight change:  Body mass index is 33.9 kg/m.   Physical Exam: General exam: Very pleasant, elderly Caucasian female.  Not in distress. I did not do a detailed exam d/t comfort care status.  Data Review: I have personally reviewed the laboratory data and studies available.  F/u labs ordered  Signed, Hoyt Macleod, MD [facility] 06/20/2023

## 2023-07-01 NOTE — Progress Notes (Signed)
 PT Cancellation Note  Patient Details Name: DESHAE DICKISON MRN: [id number] DOB: 12/13/1945   Cancelled Treatment:    Reason Eval/Treat Not Completed: Other (comment)  Noted pt on comfort manners and per note "anticipate hospital death."  Will sign off PT at this time. Cyd Dowse, PT Acute Rehab [facility] [phone]  Carolynn Citrin 06/30/2023, 9:43 AM

## 2023-07-01 NOTE — Progress Notes (Signed)
 It looks like things have truly progressed for Ms. Molder.  She is now comfort measures.  I certainly cannot argue against this.  She really has not eaten much.  She is not talking.  Her son was with her this morning.  She really has fought hard.  She did everything we asked her to do with respect to the cancer.  I detailed the fact that she developed this diverticulitis, C. difficile, E. coli bacteremia, abscess, etc.  There is no was that was going against her.  I provide that she is comfortable right now.  I think what is also informative is the fact that her prealbumin is less than 5.  I think this goes along with her decline.  She has had no obvious bleeding.  Her vital signs are pretty stable.  Blood pressure is only 70/40.  I suspect that she will not make it out of the hospital.  Again I just want her to have respect, comfort and dignity.  She truly deserves this.  I know her family have been so supportive of her.  I know that she has gotten great care from everybody up on [address].   Rayleen Cal, MD  2 Timothy 4:16-18

## 2023-07-01 NOTE — Progress Notes (Signed)
 Unfortunately patient has continued to decline and is now comfort care. Will discontinue active navigation but be available to the patient as needed.   Oncology Nurse Navigator Documentation     06/16/2023    8:00 AM  Oncology Nurse Navigator Flowsheets  Navigation Complete Date: 06/22/2023  Post Navigation: Continue to Follow Patient? No  Reason Not Navigating Patient: Pharmacologist Encounter Type Appt/Treatment Plan Review  Time Spent with Patient 15

## 2023-07-01 DEATH — deceased

## 2023-07-05 ENCOUNTER — Inpatient Hospital Stay: Payer: Self-pay | Admitting: Internal Medicine
